# Patient Record
Sex: Male | Born: 1950
Health system: Southern US, Community
[De-identification: ages and names within clinical notes are randomized; demographics above are authoritative.]

## PROBLEM LIST (undated history)

## (undated) DIAGNOSIS — I639 Cerebral infarction, unspecified: Secondary | ICD-10-CM

## (undated) DIAGNOSIS — D125 Benign neoplasm of sigmoid colon: Secondary | ICD-10-CM

## (undated) DIAGNOSIS — E669 Obesity, unspecified: Secondary | ICD-10-CM

## (undated) DIAGNOSIS — E785 Hyperlipidemia, unspecified: Secondary | ICD-10-CM

## (undated) DIAGNOSIS — R600 Localized edema: Secondary | ICD-10-CM

## (undated) DIAGNOSIS — B351 Tinea unguium: Secondary | ICD-10-CM

## (undated) DIAGNOSIS — D124 Benign neoplasm of descending colon: Secondary | ICD-10-CM

## (undated) DIAGNOSIS — N529 Male erectile dysfunction, unspecified: Secondary | ICD-10-CM

## (undated) DIAGNOSIS — F329 Major depressive disorder, single episode, unspecified: Secondary | ICD-10-CM

## (undated) DIAGNOSIS — D123 Benign neoplasm of transverse colon: Secondary | ICD-10-CM

## (undated) DIAGNOSIS — D126 Benign neoplasm of colon, unspecified: Secondary | ICD-10-CM

## (undated) DIAGNOSIS — N183 Chronic kidney disease, stage 3 unspecified: Secondary | ICD-10-CM

## (undated) DIAGNOSIS — G4733 Obstructive sleep apnea (adult) (pediatric): Secondary | ICD-10-CM

## (undated) DIAGNOSIS — I2510 Atherosclerotic heart disease of native coronary artery without angina pectoris: Secondary | ICD-10-CM

## (undated) DIAGNOSIS — G4730 Sleep apnea, unspecified: Secondary | ICD-10-CM

## (undated) DIAGNOSIS — I219 Acute myocardial infarction, unspecified: Secondary | ICD-10-CM

## (undated) DIAGNOSIS — I482 Chronic atrial fibrillation, unspecified: Secondary | ICD-10-CM

## (undated) DIAGNOSIS — I10 Essential (primary) hypertension: Secondary | ICD-10-CM

## (undated) DIAGNOSIS — K219 Gastro-esophageal reflux disease without esophagitis: Secondary | ICD-10-CM

## (undated) DIAGNOSIS — M1990 Unspecified osteoarthritis, unspecified site: Secondary | ICD-10-CM

## (undated) DIAGNOSIS — Z95810 Presence of automatic (implantable) cardiac defibrillator: Secondary | ICD-10-CM

## (undated) DIAGNOSIS — R569 Unspecified convulsions: Secondary | ICD-10-CM

## (undated) DIAGNOSIS — I5042 Chronic combined systolic (congestive) and diastolic (congestive) heart failure: Secondary | ICD-10-CM

## (undated) DIAGNOSIS — F32A Depression, unspecified: Secondary | ICD-10-CM

## (undated) HISTORY — DX: Benign neoplasm of sigmoid colon: D12.5

## (undated) HISTORY — DX: Gastro-esophageal reflux disease without esophagitis: K21.9

## (undated) HISTORY — PX: FOOT ARTHROTOMY: SUR104

## (undated) HISTORY — DX: Sleep apnea, unspecified: G47.30

## (undated) HISTORY — DX: Male erectile dysfunction, unspecified: N52.9

## (undated) HISTORY — DX: Chronic atrial fibrillation, unspecified: I48.20

## (undated) HISTORY — DX: Benign neoplasm of transverse colon: D12.3

## (undated) HISTORY — DX: Tinea unguium: B35.1

## (undated) HISTORY — DX: Benign neoplasm of colon, unspecified: D12.6

## (undated) HISTORY — DX: Localized edema: R60.0

## (undated) HISTORY — DX: Chronic kidney disease, stage 3 unspecified: N18.30

## (undated) HISTORY — DX: Depression, unspecified: F32.A

## (undated) HISTORY — DX: Acute myocardial infarction, unspecified: I21.9

## (undated) HISTORY — DX: Cerebral infarction, unspecified: I63.9

## (undated) HISTORY — DX: Hyperlipidemia, unspecified: E78.5

## (undated) HISTORY — DX: Essential (primary) hypertension: I10

## (undated) HISTORY — DX: Atherosclerotic heart disease of native coronary artery without angina pectoris: I25.10

## (undated) HISTORY — PX: COLONOSCOPY: SHX174

## (undated) HISTORY — DX: Obesity, unspecified: E66.9

## (undated) HISTORY — DX: Benign neoplasm of descending colon: D12.4

## (undated) HISTORY — DX: Obstructive sleep apnea (adult) (pediatric): G47.33

## (undated) HISTORY — DX: Chronic kidney disease, stage 3 (moderate): N18.3

## (undated) HISTORY — DX: Unspecified osteoarthritis, unspecified site: M19.90

## (undated) HISTORY — DX: Chronic combined systolic (congestive) and diastolic (congestive) heart failure: I50.42

## (undated) HISTORY — DX: Major depressive disorder, single episode, unspecified: F32.9

---

## 1997-09-15 ENCOUNTER — Encounter: Admission: RE | Admit: 1997-09-15 | Discharge: 1997-09-15 | Payer: Self-pay | Admitting: Family Medicine

## 1997-09-29 ENCOUNTER — Encounter: Admission: RE | Admit: 1997-09-29 | Discharge: 1997-09-29 | Payer: Self-pay | Admitting: Family Medicine

## 1997-10-25 ENCOUNTER — Encounter: Admission: RE | Admit: 1997-10-25 | Discharge: 1997-10-25 | Payer: Self-pay | Admitting: Family Medicine

## 1997-11-08 ENCOUNTER — Encounter: Admission: RE | Admit: 1997-11-08 | Discharge: 1997-11-08 | Payer: Self-pay | Admitting: Sports Medicine

## 1997-12-07 ENCOUNTER — Encounter: Admission: RE | Admit: 1997-12-07 | Discharge: 1997-12-07 | Payer: Self-pay | Admitting: Family Medicine

## 1997-12-19 ENCOUNTER — Encounter: Admission: RE | Admit: 1997-12-19 | Discharge: 1997-12-19 | Payer: Self-pay | Admitting: Family Medicine

## 1998-02-14 ENCOUNTER — Encounter: Admission: RE | Admit: 1998-02-14 | Discharge: 1998-02-14 | Payer: Self-pay | Admitting: Family Medicine

## 1998-03-14 ENCOUNTER — Encounter: Admission: RE | Admit: 1998-03-14 | Discharge: 1998-03-14 | Payer: Self-pay | Admitting: Sports Medicine

## 1998-03-21 ENCOUNTER — Encounter: Admission: RE | Admit: 1998-03-21 | Discharge: 1998-03-21 | Payer: Self-pay | Admitting: Sports Medicine

## 1998-06-28 ENCOUNTER — Encounter: Payer: Self-pay | Admitting: Family Medicine

## 1998-06-28 ENCOUNTER — Inpatient Hospital Stay (HOSPITAL_COMMUNITY): Admission: EM | Admit: 1998-06-28 | Discharge: 1998-06-30 | Payer: Self-pay | Admitting: Emergency Medicine

## 1998-06-30 ENCOUNTER — Encounter: Payer: Self-pay | Admitting: Family Medicine

## 1998-07-06 ENCOUNTER — Encounter: Admission: RE | Admit: 1998-07-06 | Discharge: 1998-10-04 | Payer: Self-pay | Admitting: *Deleted

## 1998-07-07 ENCOUNTER — Ambulatory Visit (HOSPITAL_COMMUNITY): Admission: RE | Admit: 1998-07-07 | Discharge: 1998-07-07 | Payer: Self-pay | Admitting: Family Medicine

## 1998-07-07 ENCOUNTER — Encounter: Admission: RE | Admit: 1998-07-07 | Discharge: 1998-07-07 | Payer: Self-pay | Admitting: Family Medicine

## 1998-07-13 ENCOUNTER — Encounter: Admission: RE | Admit: 1998-07-13 | Discharge: 1998-07-13 | Payer: Self-pay | Admitting: Family Medicine

## 1998-07-21 ENCOUNTER — Encounter: Admission: RE | Admit: 1998-07-21 | Discharge: 1998-07-21 | Payer: Self-pay | Admitting: Family Medicine

## 1998-08-08 ENCOUNTER — Encounter: Admission: RE | Admit: 1998-08-08 | Discharge: 1998-08-08 | Payer: Self-pay | Admitting: Sports Medicine

## 1998-08-24 ENCOUNTER — Encounter: Admission: RE | Admit: 1998-08-24 | Discharge: 1998-08-24 | Payer: Self-pay | Admitting: Family Medicine

## 1998-09-13 ENCOUNTER — Encounter: Admission: RE | Admit: 1998-09-13 | Discharge: 1998-09-13 | Payer: Self-pay | Admitting: Family Medicine

## 1998-10-13 ENCOUNTER — Encounter: Admission: RE | Admit: 1998-10-13 | Discharge: 1998-10-13 | Payer: Self-pay | Admitting: Family Medicine

## 1998-10-25 ENCOUNTER — Other Ambulatory Visit: Admission: RE | Admit: 1998-10-25 | Discharge: 1998-10-25 | Payer: Self-pay | Admitting: Gastroenterology

## 1998-12-07 ENCOUNTER — Encounter: Admission: RE | Admit: 1998-12-07 | Discharge: 1998-12-07 | Payer: Self-pay | Admitting: Family Medicine

## 1999-01-04 ENCOUNTER — Encounter: Admission: RE | Admit: 1999-01-04 | Discharge: 1999-01-04 | Payer: Self-pay | Admitting: Family Medicine

## 1999-02-21 ENCOUNTER — Encounter: Admission: RE | Admit: 1999-02-21 | Discharge: 1999-02-21 | Payer: Self-pay | Admitting: Family Medicine

## 1999-03-09 ENCOUNTER — Encounter: Admission: RE | Admit: 1999-03-09 | Discharge: 1999-03-09 | Payer: Self-pay | Admitting: Family Medicine

## 1999-04-05 ENCOUNTER — Encounter: Admission: RE | Admit: 1999-04-05 | Discharge: 1999-04-05 | Payer: Self-pay | Admitting: Family Medicine

## 1999-05-28 HISTORY — PX: ANGIOPLASTY: SHX39

## 1999-06-03 ENCOUNTER — Encounter: Payer: Self-pay | Admitting: Emergency Medicine

## 1999-06-03 ENCOUNTER — Inpatient Hospital Stay (HOSPITAL_COMMUNITY): Admission: EM | Admit: 1999-06-03 | Discharge: 1999-06-05 | Payer: Self-pay | Admitting: Emergency Medicine

## 1999-06-08 ENCOUNTER — Encounter: Admission: RE | Admit: 1999-06-08 | Discharge: 1999-06-08 | Payer: Self-pay | Admitting: Family Medicine

## 1999-07-09 ENCOUNTER — Encounter: Payer: Self-pay | Admitting: Family Medicine

## 1999-07-09 ENCOUNTER — Encounter: Admission: RE | Admit: 1999-07-09 | Discharge: 1999-07-09 | Payer: Self-pay | Admitting: Family Medicine

## 1999-07-09 ENCOUNTER — Ambulatory Visit (HOSPITAL_COMMUNITY): Admission: RE | Admit: 1999-07-09 | Discharge: 1999-07-09 | Payer: Self-pay | Admitting: Family Medicine

## 1999-07-09 ENCOUNTER — Inpatient Hospital Stay (HOSPITAL_COMMUNITY): Admission: EM | Admit: 1999-07-09 | Discharge: 1999-07-10 | Payer: Self-pay | Admitting: *Deleted

## 1999-07-10 ENCOUNTER — Encounter: Payer: Self-pay | Admitting: Family Medicine

## 1999-07-23 ENCOUNTER — Encounter: Admission: RE | Admit: 1999-07-23 | Discharge: 1999-07-23 | Payer: Self-pay | Admitting: Family Medicine

## 1999-08-10 ENCOUNTER — Encounter: Admission: RE | Admit: 1999-08-10 | Discharge: 1999-08-10 | Payer: Self-pay | Admitting: Family Medicine

## 1999-08-30 ENCOUNTER — Encounter: Admission: RE | Admit: 1999-08-30 | Discharge: 1999-08-30 | Payer: Self-pay | Admitting: Family Medicine

## 2000-01-17 ENCOUNTER — Encounter: Admission: RE | Admit: 2000-01-17 | Discharge: 2000-01-17 | Payer: Self-pay | Admitting: Family Medicine

## 2000-05-06 ENCOUNTER — Encounter: Admission: RE | Admit: 2000-05-06 | Discharge: 2000-05-06 | Payer: Self-pay | Admitting: Family Medicine

## 2000-06-13 ENCOUNTER — Encounter: Admission: RE | Admit: 2000-06-13 | Discharge: 2000-06-13 | Payer: Self-pay | Admitting: Family Medicine

## 2000-06-17 ENCOUNTER — Encounter: Admission: RE | Admit: 2000-06-17 | Discharge: 2000-06-17 | Payer: Self-pay | Admitting: Family Medicine

## 2000-07-08 ENCOUNTER — Encounter: Admission: RE | Admit: 2000-07-08 | Discharge: 2000-07-08 | Payer: Self-pay | Admitting: Family Medicine

## 2000-08-05 ENCOUNTER — Encounter: Admission: RE | Admit: 2000-08-05 | Discharge: 2000-08-05 | Payer: Self-pay | Admitting: Family Medicine

## 2000-10-15 ENCOUNTER — Encounter: Admission: RE | Admit: 2000-10-15 | Discharge: 2000-10-15 | Payer: Self-pay | Admitting: Family Medicine

## 2000-11-20 ENCOUNTER — Encounter: Admission: RE | Admit: 2000-11-20 | Discharge: 2000-11-20 | Payer: Self-pay | Admitting: Family Medicine

## 2001-02-02 ENCOUNTER — Encounter: Admission: RE | Admit: 2001-02-02 | Discharge: 2001-02-02 | Payer: Self-pay | Admitting: Family Medicine

## 2001-02-06 ENCOUNTER — Encounter: Admission: RE | Admit: 2001-02-06 | Discharge: 2001-02-06 | Payer: Self-pay | Admitting: Family Medicine

## 2001-02-23 ENCOUNTER — Encounter: Admission: RE | Admit: 2001-02-23 | Discharge: 2001-02-23 | Payer: Self-pay | Admitting: Family Medicine

## 2001-07-09 ENCOUNTER — Encounter: Admission: RE | Admit: 2001-07-09 | Discharge: 2001-07-09 | Payer: Self-pay | Admitting: Family Medicine

## 2001-07-30 ENCOUNTER — Encounter: Admission: RE | Admit: 2001-07-30 | Discharge: 2001-07-30 | Payer: Self-pay | Admitting: Family Medicine

## 2001-12-23 ENCOUNTER — Encounter: Admission: RE | Admit: 2001-12-23 | Discharge: 2001-12-23 | Payer: Self-pay | Admitting: Family Medicine

## 2002-03-17 ENCOUNTER — Inpatient Hospital Stay (HOSPITAL_COMMUNITY): Admission: EM | Admit: 2002-03-17 | Discharge: 2002-03-20 | Payer: Self-pay | Admitting: Emergency Medicine

## 2002-03-17 ENCOUNTER — Encounter: Payer: Self-pay | Admitting: Emergency Medicine

## 2002-03-17 ENCOUNTER — Encounter: Admission: RE | Admit: 2002-03-17 | Discharge: 2002-03-17 | Payer: Self-pay | Admitting: Sports Medicine

## 2002-03-25 ENCOUNTER — Encounter: Admission: RE | Admit: 2002-03-25 | Discharge: 2002-03-25 | Payer: Self-pay | Admitting: Family Medicine

## 2002-04-06 ENCOUNTER — Encounter: Admission: RE | Admit: 2002-04-06 | Discharge: 2002-04-06 | Payer: Self-pay | Admitting: Family Medicine

## 2002-04-29 ENCOUNTER — Encounter: Admission: RE | Admit: 2002-04-29 | Discharge: 2002-04-29 | Payer: Self-pay | Admitting: Family Medicine

## 2002-06-22 ENCOUNTER — Encounter: Admission: RE | Admit: 2002-06-22 | Discharge: 2002-06-22 | Payer: Self-pay | Admitting: Family Medicine

## 2002-07-26 ENCOUNTER — Encounter: Admission: RE | Admit: 2002-07-26 | Discharge: 2002-07-26 | Payer: Self-pay | Admitting: Family Medicine

## 2002-08-31 ENCOUNTER — Encounter: Admission: RE | Admit: 2002-08-31 | Discharge: 2002-08-31 | Payer: Self-pay | Admitting: Sports Medicine

## 2002-09-28 ENCOUNTER — Encounter: Admission: RE | Admit: 2002-09-28 | Discharge: 2002-09-28 | Payer: Self-pay | Admitting: Family Medicine

## 2002-10-26 DIAGNOSIS — D126 Benign neoplasm of colon, unspecified: Secondary | ICD-10-CM

## 2002-10-26 HISTORY — DX: Benign neoplasm of colon, unspecified: D12.6

## 2002-10-27 ENCOUNTER — Encounter (INDEPENDENT_AMBULATORY_CARE_PROVIDER_SITE_OTHER): Payer: Self-pay | Admitting: Specialist

## 2002-10-27 ENCOUNTER — Ambulatory Visit (HOSPITAL_COMMUNITY): Admission: RE | Admit: 2002-10-27 | Discharge: 2002-10-27 | Payer: Self-pay | Admitting: Gastroenterology

## 2002-10-28 ENCOUNTER — Encounter: Admission: RE | Admit: 2002-10-28 | Discharge: 2002-10-28 | Payer: Self-pay | Admitting: Sports Medicine

## 2003-02-17 ENCOUNTER — Encounter: Admission: RE | Admit: 2003-02-17 | Discharge: 2003-02-17 | Payer: Self-pay | Admitting: Family Medicine

## 2003-03-23 ENCOUNTER — Encounter: Admission: RE | Admit: 2003-03-23 | Discharge: 2003-03-23 | Payer: Self-pay | Admitting: Family Medicine

## 2003-05-04 ENCOUNTER — Encounter: Admission: RE | Admit: 2003-05-04 | Discharge: 2003-05-04 | Payer: Self-pay | Admitting: Family Medicine

## 2003-06-07 ENCOUNTER — Encounter: Admission: RE | Admit: 2003-06-07 | Discharge: 2003-06-07 | Payer: Self-pay | Admitting: Sports Medicine

## 2003-06-20 ENCOUNTER — Encounter: Admission: RE | Admit: 2003-06-20 | Discharge: 2003-06-20 | Payer: Self-pay | Admitting: Family Medicine

## 2003-06-24 ENCOUNTER — Encounter: Admission: RE | Admit: 2003-06-24 | Discharge: 2003-06-24 | Payer: Self-pay | Admitting: Sports Medicine

## 2003-06-29 ENCOUNTER — Encounter: Admission: RE | Admit: 2003-06-29 | Discharge: 2003-06-29 | Payer: Self-pay | Admitting: Family Medicine

## 2003-08-03 ENCOUNTER — Encounter: Admission: RE | Admit: 2003-08-03 | Discharge: 2003-08-03 | Payer: Self-pay | Admitting: Family Medicine

## 2003-08-17 ENCOUNTER — Encounter: Admission: RE | Admit: 2003-08-17 | Discharge: 2003-08-17 | Payer: Self-pay | Admitting: Family Medicine

## 2003-12-01 ENCOUNTER — Encounter: Admission: RE | Admit: 2003-12-01 | Discharge: 2003-12-01 | Payer: Self-pay | Admitting: Family Medicine

## 2004-03-09 ENCOUNTER — Ambulatory Visit: Payer: Self-pay | Admitting: Family Medicine

## 2004-04-02 ENCOUNTER — Ambulatory Visit: Payer: Self-pay | Admitting: Cardiology

## 2004-04-06 ENCOUNTER — Ambulatory Visit: Payer: Self-pay

## 2004-04-16 ENCOUNTER — Ambulatory Visit: Payer: Self-pay | Admitting: Sports Medicine

## 2004-04-24 ENCOUNTER — Encounter (HOSPITAL_BASED_OUTPATIENT_CLINIC_OR_DEPARTMENT_OTHER): Admission: RE | Admit: 2004-04-24 | Discharge: 2004-04-27 | Payer: Self-pay | Admitting: Internal Medicine

## 2004-06-01 ENCOUNTER — Ambulatory Visit: Payer: Self-pay | Admitting: Family Medicine

## 2004-07-06 ENCOUNTER — Ambulatory Visit: Payer: Self-pay | Admitting: Sports Medicine

## 2004-07-31 ENCOUNTER — Encounter (HOSPITAL_BASED_OUTPATIENT_CLINIC_OR_DEPARTMENT_OTHER): Admission: RE | Admit: 2004-07-31 | Discharge: 2004-08-24 | Payer: Self-pay | Admitting: Internal Medicine

## 2004-09-21 ENCOUNTER — Ambulatory Visit: Payer: Self-pay | Admitting: Family Medicine

## 2004-11-14 ENCOUNTER — Ambulatory Visit: Payer: Self-pay | Admitting: Family Medicine

## 2004-12-17 ENCOUNTER — Ambulatory Visit: Payer: Self-pay | Admitting: Sports Medicine

## 2005-03-01 ENCOUNTER — Ambulatory Visit: Payer: Self-pay | Admitting: Family Medicine

## 2005-05-01 ENCOUNTER — Ambulatory Visit: Payer: Self-pay

## 2005-06-10 ENCOUNTER — Ambulatory Visit: Payer: Self-pay | Admitting: Sports Medicine

## 2005-08-27 ENCOUNTER — Ambulatory Visit: Payer: Self-pay | Admitting: Family Medicine

## 2005-08-28 ENCOUNTER — Encounter: Admission: RE | Admit: 2005-08-28 | Discharge: 2005-08-28 | Payer: Self-pay | Admitting: Sports Medicine

## 2005-09-09 ENCOUNTER — Ambulatory Visit: Payer: Self-pay | Admitting: Cardiology

## 2005-09-19 ENCOUNTER — Ambulatory Visit: Payer: Self-pay

## 2005-09-24 ENCOUNTER — Ambulatory Visit: Payer: Self-pay | Admitting: Family Medicine

## 2005-12-04 ENCOUNTER — Ambulatory Visit (HOSPITAL_COMMUNITY): Admission: RE | Admit: 2005-12-04 | Discharge: 2005-12-04 | Payer: Self-pay | Admitting: Family Medicine

## 2005-12-04 ENCOUNTER — Ambulatory Visit: Payer: Self-pay | Admitting: Sports Medicine

## 2005-12-05 ENCOUNTER — Ambulatory Visit: Payer: Self-pay | Admitting: Family Medicine

## 2005-12-26 ENCOUNTER — Ambulatory Visit: Payer: Self-pay | Admitting: Cardiology

## 2006-01-10 ENCOUNTER — Ambulatory Visit: Payer: Self-pay | Admitting: Family Medicine

## 2006-02-13 ENCOUNTER — Ambulatory Visit: Payer: Self-pay | Admitting: Sports Medicine

## 2006-02-20 ENCOUNTER — Ambulatory Visit: Payer: Self-pay | Admitting: Sports Medicine

## 2006-04-07 ENCOUNTER — Ambulatory Visit: Payer: Self-pay | Admitting: Sports Medicine

## 2006-04-09 ENCOUNTER — Ambulatory Visit (HOSPITAL_COMMUNITY): Admission: RE | Admit: 2006-04-09 | Discharge: 2006-04-09 | Payer: Self-pay | Admitting: Family Medicine

## 2006-05-30 ENCOUNTER — Ambulatory Visit: Payer: Self-pay | Admitting: Family Medicine

## 2006-07-04 ENCOUNTER — Ambulatory Visit: Payer: Self-pay | Admitting: Family Medicine

## 2006-07-24 DIAGNOSIS — I2510 Atherosclerotic heart disease of native coronary artery without angina pectoris: Secondary | ICD-10-CM | POA: Insufficient documentation

## 2006-07-24 DIAGNOSIS — I10 Essential (primary) hypertension: Secondary | ICD-10-CM | POA: Insufficient documentation

## 2006-07-24 DIAGNOSIS — F32A Depression, unspecified: Secondary | ICD-10-CM | POA: Insufficient documentation

## 2006-07-24 DIAGNOSIS — F5232 Male orgasmic disorder: Secondary | ICD-10-CM | POA: Insufficient documentation

## 2006-07-24 DIAGNOSIS — F329 Major depressive disorder, single episode, unspecified: Secondary | ICD-10-CM | POA: Insufficient documentation

## 2006-07-24 DIAGNOSIS — Z794 Long term (current) use of insulin: Secondary | ICD-10-CM

## 2006-07-24 DIAGNOSIS — E1149 Type 2 diabetes mellitus with other diabetic neurological complication: Secondary | ICD-10-CM | POA: Insufficient documentation

## 2006-07-24 DIAGNOSIS — K219 Gastro-esophageal reflux disease without esophagitis: Secondary | ICD-10-CM | POA: Insufficient documentation

## 2006-07-24 DIAGNOSIS — E669 Obesity, unspecified: Secondary | ICD-10-CM | POA: Insufficient documentation

## 2006-07-24 DIAGNOSIS — E7800 Pure hypercholesterolemia, unspecified: Secondary | ICD-10-CM | POA: Insufficient documentation

## 2006-07-24 DIAGNOSIS — E119 Type 2 diabetes mellitus without complications: Secondary | ICD-10-CM | POA: Insufficient documentation

## 2006-08-20 ENCOUNTER — Telehealth: Payer: Self-pay | Admitting: *Deleted

## 2006-09-23 ENCOUNTER — Ambulatory Visit: Payer: Self-pay | Admitting: Family Medicine

## 2006-09-23 LAB — CONVERTED CEMR LAB: Hgb A1c MFr Bld: 8 %

## 2006-09-24 ENCOUNTER — Telehealth: Payer: Self-pay | Admitting: *Deleted

## 2006-10-02 ENCOUNTER — Ambulatory Visit: Payer: Self-pay | Admitting: Cardiology

## 2006-10-02 LAB — CONVERTED CEMR LAB
BUN: 12 mg/dL (ref 6–23)
CO2: 31 meq/L (ref 19–32)
Calcium: 8.8 mg/dL (ref 8.4–10.5)
Chloride: 106 meq/L (ref 96–112)
Creatinine, Ser: 1.1 mg/dL (ref 0.4–1.5)
GFR calc Af Amer: 89 mL/min
GFR calc non Af Amer: 74 mL/min
Glucose, Bld: 276 mg/dL — ABNORMAL HIGH (ref 70–99)
Potassium: 3.5 meq/L (ref 3.5–5.1)
Sodium: 142 meq/L (ref 135–145)

## 2006-10-07 ENCOUNTER — Telehealth: Payer: Self-pay | Admitting: *Deleted

## 2006-11-07 ENCOUNTER — Encounter (INDEPENDENT_AMBULATORY_CARE_PROVIDER_SITE_OTHER): Payer: Self-pay | Admitting: Family Medicine

## 2007-01-07 ENCOUNTER — Ambulatory Visit: Payer: Self-pay | Admitting: Family Medicine

## 2007-01-13 ENCOUNTER — Ambulatory Visit: Payer: Self-pay | Admitting: Cardiology

## 2007-01-21 ENCOUNTER — Encounter (INDEPENDENT_AMBULATORY_CARE_PROVIDER_SITE_OTHER): Payer: Self-pay | Admitting: Family Medicine

## 2007-01-21 ENCOUNTER — Ambulatory Visit: Payer: Self-pay | Admitting: Family Medicine

## 2007-01-23 LAB — CONVERTED CEMR LAB
BUN: 11 mg/dL (ref 6–23)
CO2: 27 meq/L (ref 19–32)
Calcium: 9.3 mg/dL (ref 8.4–10.5)
Chloride: 105 meq/L (ref 96–112)
Creatinine, Ser: 0.97 mg/dL (ref 0.40–1.50)
Glucose, Bld: 111 mg/dL — ABNORMAL HIGH (ref 70–99)
Potassium: 3.8 meq/L (ref 3.5–5.3)
Sodium: 143 meq/L (ref 135–145)

## 2007-03-09 ENCOUNTER — Encounter (INDEPENDENT_AMBULATORY_CARE_PROVIDER_SITE_OTHER): Payer: Self-pay | Admitting: Family Medicine

## 2007-03-09 ENCOUNTER — Ambulatory Visit: Payer: Self-pay | Admitting: Family Medicine

## 2007-03-09 LAB — CONVERTED CEMR LAB
ALT: 17 units/L (ref 0–53)
AST: 17 units/L (ref 0–37)
Albumin: 4.3 g/dL (ref 3.5–5.2)
Alkaline Phosphatase: 102 units/L (ref 39–117)
BUN: 14 mg/dL (ref 6–23)
Bilirubin Urine: NEGATIVE
Blood in Urine, dipstick: NEGATIVE
CO2: 28 meq/L (ref 19–32)
Calcium: 9.5 mg/dL (ref 8.4–10.5)
Chloride: 106 meq/L (ref 96–112)
Creatinine, Ser: 0.95 mg/dL (ref 0.40–1.50)
Creatinine, Urine: 94.8 mg/dL
Glucose, Bld: 70 mg/dL (ref 70–99)
Glucose, Urine, Semiquant: NEGATIVE
Hgb A1c MFr Bld: 7.5 %
Ketones, urine, test strip: NEGATIVE
Nitrite: NEGATIVE
PSA: 0.52 ng/mL (ref 0.10–4.00)
Potassium: 4.4 meq/L (ref 3.5–5.3)
Protein, U semiquant: 100
Sodium: 144 meq/L (ref 135–145)
Specific Gravity, Urine: 1.02
Total Bilirubin: 0.9 mg/dL (ref 0.3–1.2)
Total Protein, Urine: 55
Total Protein: 6.9 g/dL (ref 6.0–8.3)
Urobilinogen, UA: 1
WBC Urine, dipstick: NEGATIVE
pH: 7

## 2007-03-23 ENCOUNTER — Ambulatory Visit: Payer: Self-pay | Admitting: Family Medicine

## 2007-03-23 ENCOUNTER — Encounter (INDEPENDENT_AMBULATORY_CARE_PROVIDER_SITE_OTHER): Payer: Self-pay | Admitting: Family Medicine

## 2007-03-23 LAB — CONVERTED CEMR LAB
BUN: 17 mg/dL (ref 6–23)
CO2: 26 meq/L (ref 19–32)
Calcium: 9.5 mg/dL (ref 8.4–10.5)
Chloride: 102 meq/L (ref 96–112)
Cholesterol: 115 mg/dL (ref 0–200)
Creatinine, Ser: 0.98 mg/dL (ref 0.40–1.50)
Glucose, Bld: 268 mg/dL — ABNORMAL HIGH (ref 70–99)
HDL: 37 mg/dL — ABNORMAL LOW (ref >39)
LDL Cholesterol: 49 mg/dL (ref 0–99)
Potassium: 4.5 meq/L (ref 3.5–5.3)
Sodium: 141 meq/L (ref 135–145)
Total CHOL/HDL Ratio: 3.1
Triglycerides: 147 mg/dL (ref <150)
VLDL: 29 mg/dL (ref 0–40)

## 2007-03-27 ENCOUNTER — Telehealth (INDEPENDENT_AMBULATORY_CARE_PROVIDER_SITE_OTHER): Payer: Self-pay | Admitting: Family Medicine

## 2007-04-02 ENCOUNTER — Ambulatory Visit: Payer: Self-pay | Admitting: Cardiology

## 2007-04-02 ENCOUNTER — Telehealth (INDEPENDENT_AMBULATORY_CARE_PROVIDER_SITE_OTHER): Payer: Self-pay | Admitting: Family Medicine

## 2007-06-18 ENCOUNTER — Ambulatory Visit: Payer: Self-pay | Admitting: Family Medicine

## 2007-06-18 LAB — CONVERTED CEMR LAB: Hgb A1c MFr Bld: 7.4 %

## 2007-06-26 ENCOUNTER — Emergency Department (HOSPITAL_COMMUNITY): Admission: EM | Admit: 2007-06-26 | Discharge: 2007-06-26 | Payer: Self-pay | Admitting: Emergency Medicine

## 2007-06-26 ENCOUNTER — Telehealth: Payer: Self-pay | Admitting: *Deleted

## 2007-07-21 ENCOUNTER — Ambulatory Visit: Payer: Self-pay | Admitting: Family Medicine

## 2007-07-21 DIAGNOSIS — F17200 Nicotine dependence, unspecified, uncomplicated: Secondary | ICD-10-CM | POA: Insufficient documentation

## 2007-08-19 ENCOUNTER — Encounter
Admission: RE | Admit: 2007-08-19 | Discharge: 2007-08-19 | Payer: Self-pay | Admitting: Physical Medicine and Rehabilitation

## 2007-09-15 ENCOUNTER — Telehealth (INDEPENDENT_AMBULATORY_CARE_PROVIDER_SITE_OTHER): Payer: Self-pay | Admitting: *Deleted

## 2007-09-23 ENCOUNTER — Ambulatory Visit: Payer: Self-pay | Admitting: Family Medicine

## 2007-09-23 ENCOUNTER — Encounter (INDEPENDENT_AMBULATORY_CARE_PROVIDER_SITE_OTHER): Payer: Self-pay | Admitting: Family Medicine

## 2007-09-25 LAB — CONVERTED CEMR LAB
ALT: 17 units/L (ref 0–53)
AST: 18 units/L (ref 0–37)
Albumin: 4 g/dL (ref 3.5–5.2)
Alkaline Phosphatase: 97 units/L (ref 39–117)
BUN: 14 mg/dL (ref 6–23)
CO2: 23 meq/L (ref 19–32)
Calcium: 9.3 mg/dL (ref 8.4–10.5)
Chloride: 105 meq/L (ref 96–112)
Creatinine, Ser: 0.95 mg/dL (ref 0.40–1.50)
Glucose, Bld: 213 mg/dL — ABNORMAL HIGH (ref 70–99)
HCT: 39.7 % (ref 39.0–52.0)
Hemoglobin: 12.9 g/dL — ABNORMAL LOW (ref 13.0–17.0)
MCHC: 32.5 g/dL (ref 30.0–36.0)
MCV: 88.4 fL (ref 78.0–100.0)
Platelets: 145 10*3/uL — ABNORMAL LOW (ref 150–400)
Potassium: 4.2 meq/L (ref 3.5–5.3)
RBC: 4.49 M/uL (ref 4.22–5.81)
RDW: 13.8 % (ref 11.5–15.5)
Sodium: 139 meq/L (ref 135–145)
TSH: 0.754 microintl units/mL (ref 0.350–5.50)
Total Bilirubin: 0.8 mg/dL (ref 0.3–1.2)
Total Protein: 6.8 g/dL (ref 6.0–8.3)
WBC: 6.3 10*3/uL (ref 4.0–10.5)

## 2007-10-09 ENCOUNTER — Telehealth (INDEPENDENT_AMBULATORY_CARE_PROVIDER_SITE_OTHER): Payer: Self-pay | Admitting: Family Medicine

## 2007-11-18 ENCOUNTER — Ambulatory Visit: Payer: Self-pay | Admitting: Family Medicine

## 2007-12-18 ENCOUNTER — Ambulatory Visit: Payer: Self-pay | Admitting: Family Medicine

## 2007-12-18 DIAGNOSIS — I872 Venous insufficiency (chronic) (peripheral): Secondary | ICD-10-CM | POA: Insufficient documentation

## 2007-12-18 LAB — CONVERTED CEMR LAB: Hgb A1c MFr Bld: 7.7 %

## 2007-12-21 ENCOUNTER — Telehealth (INDEPENDENT_AMBULATORY_CARE_PROVIDER_SITE_OTHER): Payer: Self-pay | Admitting: Family Medicine

## 2007-12-22 ENCOUNTER — Telehealth (INDEPENDENT_AMBULATORY_CARE_PROVIDER_SITE_OTHER): Payer: Self-pay | Admitting: Family Medicine

## 2007-12-24 ENCOUNTER — Telehealth (INDEPENDENT_AMBULATORY_CARE_PROVIDER_SITE_OTHER): Payer: Self-pay | Admitting: Family Medicine

## 2008-01-01 ENCOUNTER — Ambulatory Visit: Payer: Self-pay | Admitting: Cardiology

## 2008-01-11 ENCOUNTER — Ambulatory Visit: Payer: Self-pay

## 2008-01-11 ENCOUNTER — Encounter (INDEPENDENT_AMBULATORY_CARE_PROVIDER_SITE_OTHER): Payer: Self-pay | Admitting: Family Medicine

## 2008-02-24 ENCOUNTER — Ambulatory Visit: Payer: Self-pay | Admitting: Family Medicine

## 2008-02-24 DIAGNOSIS — E1139 Type 2 diabetes mellitus with other diabetic ophthalmic complication: Secondary | ICD-10-CM | POA: Insufficient documentation

## 2008-03-10 ENCOUNTER — Telehealth (INDEPENDENT_AMBULATORY_CARE_PROVIDER_SITE_OTHER): Payer: Self-pay | Admitting: *Deleted

## 2008-04-15 ENCOUNTER — Ambulatory Visit: Payer: Self-pay | Admitting: Family Medicine

## 2008-04-15 LAB — CONVERTED CEMR LAB: Hgb A1c MFr Bld: 7.4 %

## 2008-06-10 ENCOUNTER — Ambulatory Visit: Payer: Self-pay | Admitting: Family Medicine

## 2008-08-04 ENCOUNTER — Ambulatory Visit (HOSPITAL_COMMUNITY): Admission: RE | Admit: 2008-08-04 | Discharge: 2008-08-04 | Payer: Self-pay | Admitting: Family Medicine

## 2008-08-04 ENCOUNTER — Telehealth (INDEPENDENT_AMBULATORY_CARE_PROVIDER_SITE_OTHER): Payer: Self-pay | Admitting: Family Medicine

## 2008-08-04 ENCOUNTER — Ambulatory Visit: Payer: Self-pay

## 2008-08-04 LAB — CONVERTED CEMR LAB: Hgb A1c MFr Bld: 7.7 %

## 2008-08-05 ENCOUNTER — Ambulatory Visit: Payer: Self-pay | Admitting: Cardiology

## 2008-08-05 ENCOUNTER — Encounter: Payer: Self-pay | Admitting: Cardiovascular Disease

## 2008-08-08 ENCOUNTER — Encounter (INDEPENDENT_AMBULATORY_CARE_PROVIDER_SITE_OTHER): Payer: Self-pay | Admitting: Family Medicine

## 2008-08-30 ENCOUNTER — Encounter: Payer: Self-pay | Admitting: Cardiology

## 2008-08-30 ENCOUNTER — Ambulatory Visit: Payer: Self-pay | Admitting: Cardiology

## 2008-09-13 ENCOUNTER — Telehealth: Payer: Self-pay | Admitting: Cardiology

## 2008-09-16 ENCOUNTER — Ambulatory Visit: Payer: Self-pay | Admitting: Family Medicine

## 2008-10-14 ENCOUNTER — Telehealth: Payer: Self-pay | Admitting: *Deleted

## 2008-10-20 ENCOUNTER — Encounter (INDEPENDENT_AMBULATORY_CARE_PROVIDER_SITE_OTHER): Payer: Self-pay | Admitting: Family Medicine

## 2008-10-20 ENCOUNTER — Ambulatory Visit: Payer: Self-pay | Admitting: Family Medicine

## 2008-10-20 LAB — CONVERTED CEMR LAB
ALT: 20 units/L (ref 0–53)
AST: 18 units/L (ref 0–37)
Albumin: 3.9 g/dL (ref 3.5–5.2)
Alkaline Phosphatase: 103 units/L (ref 39–117)
BUN: 9 mg/dL (ref 6–23)
CO2: 24 meq/L (ref 19–32)
Calcium: 9.2 mg/dL (ref 8.4–10.5)
Chloride: 104 meq/L (ref 96–112)
Cholesterol: 110 mg/dL (ref 0–200)
Creatinine, Ser: 0.95 mg/dL (ref 0.40–1.50)
Glucose, Bld: 228 mg/dL — ABNORMAL HIGH (ref 70–99)
HDL: 40 mg/dL (ref >39)
LDL Cholesterol: 59 mg/dL (ref 0–99)
Potassium: 3.9 meq/L (ref 3.5–5.3)
Sodium: 140 meq/L (ref 135–145)
Total Bilirubin: 0.8 mg/dL (ref 0.3–1.2)
Total CHOL/HDL Ratio: 2.8
Total Protein: 6.3 g/dL (ref 6.0–8.3)
Triglycerides: 53 mg/dL (ref <150)
VLDL: 11 mg/dL (ref 0–40)

## 2008-10-21 ENCOUNTER — Encounter (INDEPENDENT_AMBULATORY_CARE_PROVIDER_SITE_OTHER): Payer: Self-pay | Admitting: Family Medicine

## 2008-11-07 ENCOUNTER — Ambulatory Visit: Payer: Self-pay | Admitting: Family Medicine

## 2008-11-07 DIAGNOSIS — B351 Tinea unguium: Secondary | ICD-10-CM | POA: Insufficient documentation

## 2008-11-07 LAB — CONVERTED CEMR LAB: Hgb A1c MFr Bld: 8.1 %

## 2008-11-21 ENCOUNTER — Telehealth: Payer: Self-pay | Admitting: Family Medicine

## 2008-11-23 ENCOUNTER — Encounter: Payer: Self-pay | Admitting: *Deleted

## 2008-11-24 ENCOUNTER — Encounter: Payer: Self-pay | Admitting: Family Medicine

## 2008-11-25 ENCOUNTER — Encounter: Payer: Self-pay | Admitting: Family Medicine

## 2008-12-26 ENCOUNTER — Telehealth: Payer: Self-pay | Admitting: Family Medicine

## 2009-01-09 ENCOUNTER — Encounter: Payer: Self-pay | Admitting: Family Medicine

## 2009-01-10 ENCOUNTER — Ambulatory Visit: Payer: Self-pay | Admitting: Family Medicine

## 2009-01-26 ENCOUNTER — Ambulatory Visit: Payer: Self-pay | Admitting: Family Medicine

## 2009-03-30 ENCOUNTER — Ambulatory Visit: Payer: Self-pay | Admitting: Family Medicine

## 2009-03-30 LAB — CONVERTED CEMR LAB: Hgb A1c MFr Bld: 8 %

## 2009-04-04 ENCOUNTER — Telehealth: Payer: Self-pay | Admitting: Family Medicine

## 2009-05-16 ENCOUNTER — Encounter: Payer: Self-pay | Admitting: Family Medicine

## 2009-06-15 ENCOUNTER — Telehealth: Payer: Self-pay | Admitting: Family Medicine

## 2009-07-12 ENCOUNTER — Ambulatory Visit: Payer: Self-pay | Admitting: Family Medicine

## 2009-07-12 ENCOUNTER — Ambulatory Visit (HOSPITAL_COMMUNITY): Admission: RE | Admit: 2009-07-12 | Discharge: 2009-07-12 | Payer: Self-pay | Admitting: Family Medicine

## 2009-07-12 LAB — CONVERTED CEMR LAB: Hgb A1c MFr Bld: 7.6 %

## 2009-07-13 ENCOUNTER — Ambulatory Visit: Payer: Self-pay | Admitting: Family Medicine

## 2009-07-13 ENCOUNTER — Telehealth: Payer: Self-pay | Admitting: Family Medicine

## 2009-07-13 DIAGNOSIS — J029 Acute pharyngitis, unspecified: Secondary | ICD-10-CM | POA: Insufficient documentation

## 2009-07-13 LAB — CONVERTED CEMR LAB: Rapid Strep: POSITIVE

## 2009-07-20 ENCOUNTER — Encounter: Payer: Self-pay | Admitting: Family Medicine

## 2009-08-04 ENCOUNTER — Telehealth: Payer: Self-pay | Admitting: Family Medicine

## 2009-08-10 ENCOUNTER — Ambulatory Visit: Payer: Self-pay | Admitting: Family Medicine

## 2009-08-10 ENCOUNTER — Telehealth: Payer: Self-pay | Admitting: Family Medicine

## 2009-08-10 ENCOUNTER — Encounter: Payer: Self-pay | Admitting: Sports Medicine

## 2009-08-10 ENCOUNTER — Telehealth: Payer: Self-pay | Admitting: Sports Medicine

## 2009-08-15 ENCOUNTER — Encounter (INDEPENDENT_AMBULATORY_CARE_PROVIDER_SITE_OTHER): Payer: Self-pay | Admitting: *Deleted

## 2009-08-15 ENCOUNTER — Encounter: Payer: Self-pay | Admitting: Family Medicine

## 2009-08-15 ENCOUNTER — Ambulatory Visit: Payer: Self-pay | Admitting: Family Medicine

## 2009-08-15 DIAGNOSIS — R609 Edema, unspecified: Secondary | ICD-10-CM | POA: Insufficient documentation

## 2009-08-15 LAB — CONVERTED CEMR LAB
ALT: 15 units/L (ref 0–53)
AST: 20 units/L (ref 0–37)
Albumin: 3.6 g/dL (ref 3.5–5.2)
Alkaline Phosphatase: 85 units/L (ref 39–117)
BUN: 10 mg/dL (ref 6–23)
CO2: 24 meq/L (ref 19–32)
Calcium: 9.1 mg/dL (ref 8.4–10.5)
Chloride: 105 meq/L (ref 96–112)
Creatinine, Ser: 0.98 mg/dL (ref 0.40–1.50)
Glucose, Bld: 74 mg/dL (ref 70–99)
Potassium: 3.7 meq/L (ref 3.5–5.3)
Sodium: 141 meq/L (ref 135–145)
Total Bilirubin: 0.5 mg/dL (ref 0.3–1.2)
Total Protein: 6 g/dL (ref 6.0–8.3)

## 2009-09-04 ENCOUNTER — Emergency Department (HOSPITAL_COMMUNITY): Admission: EM | Admit: 2009-09-04 | Discharge: 2009-09-04 | Payer: Self-pay | Admitting: Emergency Medicine

## 2009-09-04 ENCOUNTER — Telehealth: Payer: Self-pay | Admitting: Family Medicine

## 2009-09-06 ENCOUNTER — Encounter: Payer: Self-pay | Admitting: Family Medicine

## 2009-09-07 ENCOUNTER — Ambulatory Visit: Payer: Self-pay | Admitting: Family Medicine

## 2009-09-07 DIAGNOSIS — N200 Calculus of kidney: Secondary | ICD-10-CM | POA: Insufficient documentation

## 2009-09-07 DIAGNOSIS — N3941 Urge incontinence: Secondary | ICD-10-CM | POA: Insufficient documentation

## 2009-09-07 LAB — CONVERTED CEMR LAB
Bilirubin Urine: NEGATIVE
Glucose, Urine, Semiquant: 100
Ketones, urine, test strip: NEGATIVE
Nitrite: NEGATIVE
Protein, U semiquant: 100
Specific Gravity, Urine: 1.01
Urobilinogen, UA: 0.2
WBC Urine, dipstick: NEGATIVE
pH: 5.5

## 2009-09-11 ENCOUNTER — Ambulatory Visit: Payer: Self-pay | Admitting: Cardiology

## 2009-10-16 ENCOUNTER — Ambulatory Visit: Payer: Self-pay | Admitting: Family Medicine

## 2009-11-29 ENCOUNTER — Encounter: Payer: Self-pay | Admitting: *Deleted

## 2009-12-13 ENCOUNTER — Ambulatory Visit: Payer: Self-pay | Admitting: Family Medicine

## 2009-12-26 ENCOUNTER — Telehealth: Payer: Self-pay | Admitting: Family Medicine

## 2009-12-27 ENCOUNTER — Encounter: Payer: Self-pay | Admitting: Family Medicine

## 2010-02-08 ENCOUNTER — Ambulatory Visit: Payer: Self-pay | Admitting: Family Medicine

## 2010-02-08 LAB — CONVERTED CEMR LAB: Hgb A1c MFr Bld: 11.3 %

## 2010-02-09 ENCOUNTER — Encounter: Payer: Self-pay | Admitting: Family Medicine

## 2010-02-09 LAB — CONVERTED CEMR LAB
BUN: 8 mg/dL (ref 6–23)
Basophils Absolute: 0 10*3/uL (ref 0.0–0.1)
Basophils Relative: 0 % (ref 0–1)
CO2: 26 meq/L (ref 19–32)
Calcium: 8.9 mg/dL (ref 8.4–10.5)
Chloride: 102 meq/L (ref 96–112)
Creatinine, Ser: 0.88 mg/dL (ref 0.40–1.50)
Eosinophils Absolute: 0.1 10*3/uL (ref 0.0–0.7)
Eosinophils Relative: 1 % (ref 0–5)
Glucose, Bld: 347 mg/dL — ABNORMAL HIGH (ref 70–99)
HCT: 40.5 % (ref 39.0–52.0)
Hemoglobin: 13.1 g/dL (ref 13.0–17.0)
Lymphocytes Relative: 15 % (ref 12–46)
Lymphs Abs: 1.5 10*3/uL (ref 0.7–4.0)
MCHC: 32.3 g/dL (ref 30.0–36.0)
MCV: 86 fL (ref 78.0–100.0)
Monocytes Absolute: 0.9 10*3/uL (ref 0.1–1.0)
Monocytes Relative: 9 % (ref 3–12)
Neutro Abs: 7.3 10*3/uL (ref 1.7–7.7)
Neutrophils Relative %: 76 % (ref 43–77)
Platelets: 157 10*3/uL (ref 150–400)
Potassium: 4.4 meq/L (ref 3.5–5.3)
RBC: 4.71 M/uL (ref 4.22–5.81)
RDW: 13.6 % (ref 11.5–15.5)
Sodium: 139 meq/L (ref 135–145)
WBC: 9.7 10*3/uL (ref 4.0–10.5)

## 2010-03-27 ENCOUNTER — Encounter (INDEPENDENT_AMBULATORY_CARE_PROVIDER_SITE_OTHER): Payer: Self-pay | Admitting: Pharmacist

## 2010-04-12 ENCOUNTER — Telehealth: Payer: Self-pay | Admitting: Cardiology

## 2010-05-08 ENCOUNTER — Telehealth: Payer: Self-pay | Admitting: Family Medicine

## 2010-05-27 HISTORY — PX: TOE AMPUTATION: SHX809

## 2010-06-11 ENCOUNTER — Ambulatory Visit: Admission: RE | Admit: 2010-06-11 | Discharge: 2010-06-11 | Payer: Self-pay | Source: Home / Self Care

## 2010-06-26 NOTE — Progress Notes (Signed)
Summary: Medication  Phone Note Call from Patient Call back at Home Phone [phone]   Reason for Call: Talk to Doctor Summary of Call: pt received letter from [facility], where he received his meds & sts they will no longer carry his blood pressure medication after 08/26/06, pt would like to know if there is an alternative for this medication? Initial call taken by: ERIN LEVAN,  August 20, 2006 1:58 PM  Follow-up for Phone Call        Please contact insurance company (or have pt do so) and find out what BP med he was on that they do not cover and which ones they do cover. THanks Follow-up by: Lady Deutscher MD,  August 21, 2006 1:40 PM  Additional Follow-up for Phone Call Additional follow up Details #1::        LMOVM to do the above Additional Follow-up by: Christen Bame CMA,  August 21, 2006 1:54 PM

## 2010-06-26 NOTE — Progress Notes (Signed)
Summary: Refill  Phone Note Call from Patient Call back at Home Phone [phone]   Summary of Call: Pt is needing refills on all meds faxed to [facility] - Fax number is [phone]. Initial call taken by: Drucie Ip,  March 27, 2007 2:38 PM  Follow-up for Phone Call        please have RN staff to fax....meds updated in centricity.   Follow-up by: Marzetta Merino  MD,  March 27, 2007 2:53 PM  Additional Follow-up for Phone Call Additional follow up Details #1::        Rxs faxed to [facility] - pt notified.  Additional Follow-up by: AMY MARTIN RN,  March 27, 2007 3:58 PM      Prescriptions: HYZAAR 100-25 MG TABS (LOSARTAN POTASSIUM-HCTZ) Take 1 tablet by mouth once a day  #90 x 3   Entered by:   AMY MARTIN RN   Authorized by:   Marzetta Merino  MD   Signed by:   Arnette Schaumann RN on 03/27/2007   Method used:   Print then Give to Patient   RxID:   [id number] ISOSORBIDE MONONITRATE CR 60 MG TB24 (ISOSORBIDE MONONITRATE) Take 1 tablet by mouth once a day  #90 x 3   Entered by:   AMY MARTIN RN   Authorized by:   Marzetta Merino  MD   Signed by:   Arnette Schaumann RN on 03/27/2007   Method used:   Print then Give to Patient   [id number] LIPITOR 10 MG TABS (ATORVASTATIN CALCIUM) Take 1 tablet by mouth every night  #90 x 3   Entered by:   AMY MARTIN RN   Authorized by:   Marzetta Merino  MD   Signed by:   Arnette Schaumann RN on 03/27/2007   Method used:   Print then Give to Patient   [id number] NORVASC 10 MG TABS (AMLODIPINE BESYLATE) Take 1/2  tablet by mouth once a day  #90 x 3   Entered by:   AMY MARTIN RN   Authorized by:   Marzetta Merino  MD   Signed by:   Arnette Schaumann RN on 03/27/2007   Method used:   Print then Give to Patient   [id number] METFORMIN HCL 1000 MG TABS (METFORMIN HCL) 1 tablet twice a day for diabetes  #180 x 3   Entered by:   AMY MARTIN RN   Authorized by:   Marzetta Merino  MD   Signed by:   Arnette Schaumann RN on 03/27/2007   Method  used:   Print then Give to Patient   [id number] PAXIL 30 MG TABS (PAROXETINE HCL) Take 1 tablet by mouth once a day  #90 x 3   Entered by:   AMY MARTIN RN   Authorized by:   Marzetta Merino  MD   Signed by:   Arnette Schaumann RN on 03/27/2007   Method used:   Print then Give to Patient   [id number] PREVACID 30 MG CPDR (LANSOPRAZOLE) Take 1 capsule by mouth once a day  #90 x 3   Entered by:   AMY MARTIN RN   Authorized by:   Marzetta Merino  MD   Signed by:   Arnette Schaumann RN on 03/27/2007   Method used:   Print then Give to Patient   RxID:   [id number] RELION 70/30 70-30 % SUSP (INSULIN ISOPHANE & REGULAR) Inject 60 unit subcutaneously  #6 x 5  Entered by:   AMY MARTIN RN   Authorized by:   Marzetta Merino  MD   Signed by:   Arnette Schaumann RN on 03/27/2007   Method used:   Print then Give to Patient   RxID:   [id number] TOPROL XL 200 MG  TB24 (METOPROLOL SUCCINATE) 1 tablet one time a day for blood pressure  #90 x 3   Entered by:   AMY MARTIN RN   Authorized by:   Marzetta Merino  MD   Signed by:   Arnette Schaumann RN on 03/27/2007   Method used:   Print then Give to Patient   RxID:   [id number] SPIRONOLACTONE 25 MG TABS (SPIRONOLACTONE) 1 tablet 1 time a day for blood pressure and to raise potassium  #90 x 3   Entered by:   AMY MARTIN RN   Authorized by:   Marzetta Merino  MD   Signed by:   Arnette Schaumann RN on 03/27/2007   Method used:   Print then Give to Patient   [id number] JANUVIA 100 MG TABS (SITAGLIPTIN PHOSPHATE) 1 tablet a day  #90 x 3   Entered by:   AMY MARTIN RN   Authorized by:   Marzetta Merino  MD   Signed by:   Arnette Schaumann RN on 03/27/2007   Method used:   Print then Give to Patient   [id number]

## 2010-06-26 NOTE — Assessment & Plan Note (Signed)
Summary: Change of insulin Rx Clinic   Vital Signs:  Patient profile:   60 year old male Height:      72 inches Weight:      217 pounds BMI:     29.54 BP sitting:   173 / 84  (left arm)  Primary Care Provider:  [name] MD   History of Present Illness: Patient arrives in order to switch insulin from previously used. 70/30 mixed insulin (relion)  piror to visit with [facility] progran for medication assistance.   He has no health care coverage for medication at this time, so denies checking his BG for the past 2 months.   Patient denies symptoms of hypoglycemia.   Notes he has noticed increased fatigue lately and more difficulty sleeping.   Denies excesssive nocturia.  Notes he has quit smoking in the last month.    Current Medications (verified): 1)  Isosorbide Mononitrate Cr 60 Mg Tb24 (Isosorbide Mononitrate) .... Take 1 Tablet By Mouth Once A Day For Blood Pressure 2)  Metformin Hcl 1000 Mg Tabs (Metformin Hcl) .Marland Kitchen.. 1 Tablet Twice A Day For Diabetes 3)  Paroxetine Hcl 20 Mg  Tabs (Paroxetine Hcl) .... One and One-Half Tabs By Mouth Daily 4)  Prevacid 30 Mg Cpdr (Lansoprazole) .... Take 1 Capsule By Mouth Once A Day For Stomach 5)  Lantus Solostar 100 Unit/ml  Soln (Insulin Glargine) .... 60 Units Each Morning.  Dispense Quantity Sufficient For One Month Supply. 6)  Metoprolol Tartrate 100 Mg Tabs (Metoprolol Tartrate) .Marland Kitchen.. 1 Tablet By Mouth Two Times A Day For Blood Pressure 7)  Fish Oil Concentrate 1000 Mg  Caps (Omega-3 Fatty Acids) .Marland Kitchen.. 1 Tablet Daily 8)  Adult Aspirin Ec Low Strength 81 Mg Tbec (Aspirin) .Marland Kitchen.. 1 Tablet By Mouth Daily 9)  Nitrostat 0.4 Mg Subl (Nitroglycerin) .Marland Kitchen.. 1 Tab By Mouth Sl As Needed Chest Pain 10)  Multivitamins   Tabs (Multiple Vitamin) .... Take 1 Once Daily 11)  Hydrochlorothiazide 25 Mg Tabs (Hydrochlorothiazide) .Marland Kitchen.. 1 Tablet By Mouth Daily For Blood Pressure 12)  Oxycontin 10 Mg Xr12h-Tab (Oxycodone Hcl) .Marland Kitchen.. 1 Tablet By Mouth Three Times A Day-  Prescribed By Pain Doctor 13)  Simvastatin 10 Mg Tabs (Simvastatin) .Marland Kitchen.. 1 At Bedtime  Allergies (verified): 1)  Enalapril Maleate (Enalapril Maleate)   Impression & Recommendations:  Problem # 1:  DIABETES MELLITUS, II, COMPLICATIONS (A999333) Assessment Unchanged  Stopped checking his BG 2 months ago.  Complains of fatigue, and trouble sleeping (2-3x/wk).  Currently on Metformin 1000mg  BID and Novolin (Relion) 70/30 (50U qam, 10U qpm).  Reports running out of Novolin (Relion); last dose taken this morning.  Discontinue Novolin 70/30. Initiate Lantus Solostar 60U qam today and instructed on use pen.  Provided sample pen and needles.  Continue Metformin 1000mg  two times a day.  TTFFC: 35 minutes.  Seen with: Margaretha Sheffield, PharmD resident, and Carilyn Goodpasture, PharmD Candidate  His updated medication list for this problem includes:    Metformin Hcl 1000 Mg Tabs (Metformin hcl) .Marland Kitchen... 1 tablet twice a day for diabetes    Lantus Solostar 100 Unit/ml Soln (Insulin glargine) .Marland KitchenMarland KitchenMarland KitchenMarland Kitchen 60 units each morning.  dispense quantity sufficient for one month supply.    Adult Aspirin Ec Low Strength 81 Mg Tbec (Aspirin) .Marland Kitchen... 1 tablet by mouth daily  Orders: Reassessment Each 15 min unit- [place] [id number])  Problem # 2:  HYPERTENSION, BENIGN SYSTEMIC (ICD-401.1) Assessment: Deteriorated  BP uncontrolled at 173/84 and is not at goal (<130/80).  Reports nonadherence with  Amlodipine (due to peripheral edema) and states adherence with Metoprolol and HCTZ.  Continue to follow BP with Dr. Juleen China.    The following medications were removed from the medication list:    Norvasc 10 Mg Tabs (Amlodipine besylate) .Marland Kitchen... Take 1/2  tablet by mouth once a day for blood pressure His updated medication list for this problem includes:    Metoprolol Tartrate 100 Mg Tabs (Metoprolol tartrate) .Marland Kitchen... 1 tablet by mouth two times a day for blood pressure    Hydrochlorothiazide 25 Mg Tabs (Hydrochlorothiazide) .Marland Kitchen... 1 tablet  by mouth daily for blood pressure  Orders: Reassessment Each 15 min unit- [facility] [id number])  Complete Medication List: 1)  Isosorbide Mononitrate Cr 60 Mg Tb24 (Isosorbide mononitrate) .... Take 1 tablet by mouth once a day for blood pressure 2)  Metformin Hcl 1000 Mg Tabs (Metformin hcl) .Marland Kitchen.. 1 tablet twice a day for diabetes 3)  Paroxetine Hcl 20 Mg Tabs (Paroxetine hcl) .... One and one-half tabs by mouth daily 4)  Prevacid 30 Mg Cpdr (Lansoprazole) .... Take 1 capsule by mouth once a day for stomach 5)  Lantus Solostar 100 Unit/ml Soln (Insulin glargine) .... 60 units each morning.  dispense quantity sufficient for one month supply. 6)  Metoprolol Tartrate 100 Mg Tabs (Metoprolol tartrate) .Marland Kitchen.. 1 tablet by mouth two times a day for blood pressure 7)  Fish Oil Concentrate 1000 Mg Caps (Omega-3 fatty acids) .Marland Kitchen.. 1 tablet daily 8)  Adult Aspirin Ec Low Strength 81 Mg Tbec (Aspirin) .Marland Kitchen.. 1 tablet by mouth daily 9)  Nitrostat 0.4 Mg Subl (Nitroglycerin) .Marland Kitchen.. 1 tab by mouth sl as needed chest pain 10)  Multivitamins Tabs (Multiple vitamin) .... Take 1 once daily 11)  Hydrochlorothiazide 25 Mg Tabs (Hydrochlorothiazide) .Marland Kitchen.. 1 tablet by mouth daily for blood pressure 12)  Oxycontin 10 Mg Xr12h-tab (Oxycodone hcl) .Marland Kitchen.. 1 tablet by mouth three times a day- prescribed by pain doctor 13)  Simvastatin 10 Mg Tabs (Simvastatin) .Marland Kitchen.. 1 at bedtime  Patient Instructions: 1)  Stop Relion Insulin 2)  Start Lantus Solostar 60 units each morning.  3)  Continue to monitor your blood glucose at least once daily in the AM and then if you can please measure an additional time.   Remember first thing in the AM check you Blood sugar.  4)  Schedule appointment with Dr. Juleen China in next 2-3 weeks.  Prescriptions: SIMVASTATIN 10 MG TABS (SIMVASTATIN) 1 at bedtime  #30 x 5   Entered by:   Rinaldo Ratel D   Authorized by:   Briscoe Deutscher MD   Signed by:   Janeann Forehand Pharm D on 01/10/2009   Method used:   Faxed to  ...       Medication Assistance Program - [facility] (mail-order)       [address]       [place], [place]         Ph: [phone]       Fax: [phone]   RxID:   [id number] ISOSORBIDE MONONITRATE CR 60 MG TB24 (ISOSORBIDE MONONITRATE) Take 1 tablet by mouth once a day for blood pressure  #30 x 5   Entered by:   Janeann Forehand Pharm D   Authorized by:   Briscoe Deutscher MD   Signed by:   Janeann Forehand Pharm D on 01/10/2009   Method used:   Faxed to ...       Medication Assistance Program - [facility] (mail-order)  [address]       [place], [place]         Ph: [phone]       Fax: [phone]   RxID:   [id number] LANTUS SOLOSTAR 100 UNIT/ML  SOLN (INSULIN GLARGINE) 60 units each morning.  Dispense quantity sufficient for one month supply.  #1 x 11   Entered by:   Janeann Forehand Pharm D   Authorized by:   Briscoe Deutscher MD   Signed by:   Janeann Forehand Pharm D on 01/10/2009   Method used:   Faxed to ...       Medication Assistance Program - [facility] (mail-order)       [address]       [place], [place]         Ph: [phone]       Fax: [phone]   RxID:   [id number]   Prevention & Chronic Care Immunizations   Influenza vaccine: Fluvax Non-MCR  (02/25/2008)   Influenza vaccine due: 03/08/2008    Tetanus booster: 11/24/2001: Done.   Tetanus booster due: 11/25/2011    Pneumococcal vaccine: Done.  (03/27/2004)   Pneumococcal vaccine due: None  Colorectal Screening   Hemoccult: Done.  (07/26/2002)   Hemoccult due: Not Indicated    Colonoscopy: Done.  (10/26/2002)   Colonoscopy due: 10/25/2012  Other Screening   PSA: 0.52  (03/09/2007)   PSA due due: 03/08/2008   Smoking status: quit  (11/07/2008)  Diabetes Mellitus   HgbA1C: 8.1  (11/07/2008)   Hemoglobin A1C due: 12/23/2007    Eye exam: Not documented    Foot exam: yes  (11/07/2008)   High risk foot: Not documented   Foot care education: Not  documented   Foot exam due: 02/23/2009    Urine microalbumin/creatinine ratio: Not documented    Diabetes flowsheet reviewed?: Yes   Progress toward A1C goal: Unchanged  Lipids   Total Cholesterol: 110  (10/20/2008)   LDL: 59  (10/20/2008)   LDL Direct: Not documented   HDL: 40  (10/20/2008)   Triglycerides: 53  (10/20/2008)    SGOT (AST): 18  (10/20/2008)   SGPT (ALT): 20  (10/20/2008)   Alkaline phosphatase: 103  (10/20/2008)   Total bilirubin: 0.8  (10/20/2008)    Lipid flowsheet reviewed?: Yes   Progress toward LDL goal: At goal  Hypertension   Last Blood Pressure: 173 / 84  (01/10/2009)   Serum creatinine: 0.95  (10/20/2008)   Serum potassium 3.9  (10/20/2008)    Hypertension flowsheet reviewed?: Yes   Progress toward BP goal: Deteriorated  Self-Management Support :   Personal Goals (by the next clinic visit) :     Personal A1C goal: 8  (01/10/2009)     Personal blood pressure goal: 130/80  (01/10/2009)     Personal LDL goal: 100  (01/10/2009)    Diabetes self-management support: Written self-care plan  (01/10/2009)   Diabetes care plan printed    Hypertension self-management support: Not documented    Lipid self-management support: Not documented

## 2010-06-26 NOTE — Assessment & Plan Note (Signed)
Summary: FU/KH   Vital Signs:  Patient Profile:   60 Years Old Male Weight:      225 pounds Pulse rate:   65 / minute BP sitting:   137 / 85  Vitals Entered By: April Manson CMA (November 18, 2007 10:22 AM)                Flex Sig Next Due:  Not Indicated   Chief Complaint:  FU BP and DM.  History of Present Illness: HTN: better today than at last visit,  Using medications:stopped taking hyzaar when trying clonidine, states it made him feel bad.  pt is taking toprol xl 100mg , norvasc 5 mg, isosorbide mononitrate 60mg , ( hyzaar off for 3 week) clonidine 0.1mg , spironolactone 25mg .   Chest Pain: no [id number] 2-3 weeks ago, with working and Musician.  HA:no Leg Edema: off and on. Previous hx of CVA/TIA:no  DM:  stable on current meds, too early to recheck HbA1c.  still taking Tonga.   Using meds:yes Leg/foot pain:no Last Creatinine:see flowsheet Last HbA1C:see flowsheet Recent infections:no  tobacco cessation- pt is using the patch, but not on today or in last 3-4 days, smoking has decreased to 5 a day.        Current Allergies: ENALAPRIL MALEATE (ENALAPRIL MALEATE)  Past Medical History:    Nonobstructive coronary disease- Cardiologist Dr. Verl Blalock [facility])       Nuclear study low risk - 09/25/2005 EF 59%       Stress cardiolyte: neg for ischemia - 04/17/2004,        Cath/PTCA: EF 65%; stenting of mid-circumflex artery - 02/24/2002,       Sestamibi/treadmill cardiolyte: neg; EF 56% - 06/28/1999,     HTN      hx of hypokalemia    hyperlipidemia       Carotid doppler: normal - 06/27/1998,     Type II DM with complications: Diabetic macular edema, nephropathy (proteinuria), retinopathy,     Disability claim 06/1998 after fall at work,     h/o EtOH withdrawal/seizures/pancreatitis in,     h/o gastric ulcer           Colonoscopy: colon polyps (benign) - 10/26/2002,        Upper GI neg 2000,          Hpylori neg 2000        allergic rhinitis,     presbyopia, hyperopia,    hx of sciatica  Past Surgical History:    Cath/PTCA: EF 65%; stenting of mid-circumflex artery - 02/24/2002    Epidural injection for back pain - 01/25/2001,           Family History:    6 siblings: DM, HTN, gout, migraines,     Breast CA 1st degree,     Diabetes 1st degree,     Father: CHF, dementia,     Mother: DM, HTN, CVA, breast CA,       neg for CAD       Social History:    -Quit smoking in spring of 2004; -Employed at two part time jobs - Therapist, 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         Physical Exam  General:     Well-developed,well-nourished,in no acute distress; alert,appropriate and cooperative throughout examination Lungs:     Normal respiratory  effort, chest expands symmetrically. Lungs are clear to auscultation, no crackles or wheezes. Heart:     regular rhythm, no murmur, no gallop, no rub, no JVD, and bradycardia.      Impression & Recommendations:  Problem # 1:  HYPERTENSION, BENIGN SYSTEMIC (ICD-401.1) Assessment: Deteriorated would add back hyzaar during middle of day.  if symptoms return, would stop clonidine and consider adding hydralazine.  pt needs to be on ARB for hx of proteinuria and CAD.   His updated medication list for this problem includes:    Hyzaar 100-25 Mg Tabs (Losartan potassium-hctz) .Marland Kitchen... Take 1 tablet by mouth once a day    Norvasc 10 Mg Tabs (Amlodipine besylate) .Marland Kitchen... Take 1/2  tablet by mouth once a day    Toprol Xl 100 Mg Tb24 (Metoprolol succinate) .Marland Kitchen... 1 by mouth daily, not decrease in dose.    Spironolactone 25 Mg Tabs (Spironolactone) .Marland Kitchen... 1 tablet 1 time a day for blood pressure and to raise potassium    Clonidine Hcl 0.1 Mg Tabs (Clonidine hcl) .Marland Kitchen... 1 tablet twice a day for blood pressure  Orders: [place]- Est  Level 4 [id number])   Problem  # 2:  TOBACCO ABUSE (ICD-305.1) Assessment: Improved encouraged to contiue to decrease down to none.  still using patches some.   His updated medication list for this problem includes:    Eq Nicotine 14 Mg/24hr Pt24 (Nicotine) ..... Use 1 per day x 2 weeks, then go to 7 mg patch  Orders: [place]- Est  Level 4 [id number])   Problem # 3:  DIABETES MELLITUS, II, COMPLICATIONS (A999333) Assessment: Unchanged will stop [place] since on insulin.  will have pt return in 1 month for repeat HbA1c.   The following medications were removed from the medication list:    Januvia 100 Mg Tabs (Sitagliptin phosphate) .Marland Kitchen... 1 tablet a day  His updated medication list for this problem includes:    Hyzaar 100-25 Mg Tabs (Losartan potassium-hctz) .Marland Kitchen... Take 1 tablet by mouth once a day    Metformin Hcl 1000 Mg Tabs (Metformin hcl) .Marland Kitchen... 1 tablet twice a day for diabetes    Relion 70/30 70-30 % Susp (Insulin isophane & regular) ..... Inject 50 unit subcutaneously in am and 10 in pm.  disp qs    Aspirin 325 Mg Tabs (Aspirin) .Marland Kitchen... 1 tablet a day  Orders: [place]- Est  Level 4 (99214)   Complete Medication List: 1)  Hyzaar 100-25 Mg Tabs (Losartan potassium-hctz) .... Take 1 tablet by mouth once a day 2)  Isosorbide Mononitrate Cr 60 Mg Tb24 (Isosorbide mononitrate) .... Take 1 tablet by mouth once a day 3)  Lipitor 10 Mg Tabs (Atorvastatin calcium) .... Take 1 tablet by mouth every night 4)  Metformin Hcl 1000 Mg Tabs (Metformin hcl) .Marland Kitchen.. 1 tablet twice a day for diabetes 5)  Norvasc 10 Mg Tabs (Amlodipine besylate) .... Take 1/2  tablet by mouth once a day 6)  Paxil 30 Mg Tabs (Paroxetine hcl) .... Take 1 tablet by mouth once a day 7)  Prevacid 30 Mg Cpdr (Lansoprazole) .... Take 1 capsule by mouth once a day 8)  Relion 70/30 70-30 % Susp (Insulin isophane & regular) .... Inject 50 unit subcutaneously in am and 10 in pm.  disp qs 9)  Toprol Xl 100 Mg Tb24 (Metoprolol succinate) .Marland Kitchen.. 1 by mouth daily, not  decrease in dose. 10)  Bl Vitamin E Natural 400 Unit Caps (Vitamin e) .Marland Kitchen.. 1 tablet a day 11)  Vitamin C  .Marland KitchenMarland KitchenMarland Kitchen  1 tablet a day 12)  Fish Oil Concentrate 1000 Mg Caps (Omega-3 fatty acids) .Marland Kitchen.. 1 tablet daily 13)  Alba-lybe Tabs (B complex vitamins) .Marland Kitchen.. 1 tablet a day 14)  Aspirin 325 Mg Tabs (Aspirin) .Marland Kitchen.. 1 tablet a day 15)  Spironolactone 25 Mg Tabs (Spironolactone) .Marland Kitchen.. 1 tablet 1 time a day for blood pressure and to raise potassium 16)  Voltaren 1 % Gel (Diclofenac sodium) .... Two times a day  to qid as needed to elbow for pain. 17)  Celebrex 400 Mg Caps (Celecoxib) .Marland Kitchen.. 1 tablet for pain a day 18)  Eq Nicotine 14 Mg/24hr Pt24 (Nicotine) .... Use 1 per day x 2 weeks, then go to 7 mg patch 19)  Clonidine Hcl 0.1 Mg Tabs (Clonidine hcl) .Marland Kitchen.. 1 tablet twice a day for blood pressure   Patient Instructions: 1)  Restart your hyzaar at bedtime.  if this makes you not feel good, then stop clonidine by taking 1/2 tablet in AM and PM for 3 days, the 1/2 tablet in AM for 3 days, then stop.   2)  Please schedule a follow-up appointment in 1 month with Dr. [name]  to recheck DM, HbA1c, also to adjust your blood pressure medicines.  Marland Kitchen   ]

## 2010-06-26 NOTE — Miscellaneous (Signed)
Summary: can't afford meds  Clinical Lists Changes unable to but isosorbide or relion. message to pcp. we have lantus solostar sample only. plz advise. he has enough until tomorrow. He will be getting set up on [facility] thru the health dept but there is usually a lag time before drug companies process the requests & start mailing the meds.Marland KitchenMarland KitchenElige Radon RN  January 09, 2009 3:14 PM. Medications: Changed medication from RELION 70/30 70-30 % SUSP (INSULIN ISOPHANE & REGULAR) Inject 50 unit subcutaneously in AM and 10 in PM.  disp qs for 3 months to LANTUS SOLOSTAR 100 UNIT/ML  SOLN (INSULIN GLARGINE) use as directed - Signed Rx of LANTUS SOLOSTAR 100 UNIT/ML  SOLN (INSULIN GLARGINE) use as directed;  #3 boxes x 3;  Signed;  Entered by: Briscoe Deutscher MD;  Authorized by: Briscoe Deutscher MD;  Method used: Historical    Prescriptions: LANTUS SOLOSTAR 100 UNIT/ML  SOLN (INSULIN GLARGINE) use as directed  #3 boxes x 3   Entered and Authorized by:   Briscoe Deutscher MD   Signed by:   Briscoe Deutscher MD on 01/09/2009   Method used:   Historical   [id number]  INSULIN = Please give the patient a Lantus Solostar Pen (or more if available) and needles, and demonstrate how to use it. Instructions: Inject 45 units subcutaneously in the am. (This is  ~ 80% of his previous NPH dose). He will then need instructions for titration: Increase Lantus dose by 1 unit every day until blood sugar before breakfast (fasting) is less than 126. If blood sugar less than 80, decrease Lantus dose by 1 unit daily until over 80. He may also call the clinic for instructions. One pen should last about 6 days (at 45-50 units daily).   *****I will not be available tomorrow as I will be post-call, so please confirm this plan with Nicholas Lose. He may also wish for his student to demonstrate the use of the pen.   IMDUR = This medication is on the $4 list at [facility]. Please provide cash from the indigent fund if available for this  medication.  THIS PATIENT MUST GET [facility] PAPERWORK COMPLETED. This issue was first reported on 11/21/08.  ..Marland KitchenMarland KitchenBriscoe Deutscher, DO 01/09/09 9:30 PM  Appended Document: can't afford meds left message for pt to be here at 2pm today. asked that he call back to confirm he got the message. Dr. Valentina Lucks will see him then

## 2010-06-26 NOTE — Letter (Signed)
Summary: Generic Letter  [facility]  [address]   [place], [place] [address]   Phone: [phone]  Fax: [phone]    11/23/2008  MARCIANO MEGA [address] [place], [name]  [address]  Dear [place]:  We have given Mr. Ajello $8 to but 2 prescriptions that are on your $4 plan. Please sign this letter & fax back so I can track our outgoing disbursements. Joylene Igo [phone] attention Gay Filler       Sincerely,   Elige Radon RN

## 2010-06-26 NOTE — Miscellaneous (Signed)
Summary: rx issues  Clinical Lists Changes called [facility] . his metformin, isosorbide & HCTZ are all $9.99 for a 90 day supply. the simvastatin would need to be changed to lovastatin or pravachol to get the same pricing. The norvasc would need to be changed to [place] or cartia xt to get same pricing.   pharmacist also related several RX for pain. he is getting them thru a worker's comp md. He does not think he needs any of them yet. will check his supplies at home & let us know if he needs assistance. asked him to bring bottles with him in the future & schedule appt with new pcp within the next 2 months.message to pcp about changing the rxs.Marland KitchenMarland KitchenElige Radon RN  November 25, 2008 3:08 PM     Because I have never seen this patient in clinic and he has a cardiologist Press photographer, last seen 08/30/08 and medication affordability addressed then) as well as a pain specialist, I will defer those medication changes to them.   Appended Document: Orders Update    Clinical Lists Changes  Medications: Changed medication from SIMVASTATIN 20 MG TABS (SIMVASTATIN) 1 tab at bedtime to LOVASTATIN 20 MG TABS (LOVASTATIN) Take 1 tab by mouth daily - Signed Rx of LOVASTATIN 20 MG TABS (LOVASTATIN) Take 1 tab by mouth daily;  #90 x 3;  Signed;  Entered by: Briscoe Deutscher MD;  Authorized by: Briscoe Deutscher MD;  Method used: Electronically to [facility] [address] [phone]*, [place], [place], [place], [place]  [address], Ph: [phone] or [phone], Fax: [phone]    Prescriptions: LOVASTATIN 20 MG TABS (LOVASTATIN) Take 1 tab by mouth daily  #90 x 3   Entered and Authorized by:   Briscoe Deutscher MD   Signed by:   Briscoe Deutscher MD on 12/01/2008   Method used:   Electronically to        [facility] [address] [phone]* (retail)       [address]       [place], [place]  [address]       Ph: [phone] or [phone]       Fax: [phone]   RxID:   [id number]

## 2010-06-26 NOTE — Assessment & Plan Note (Signed)
Summary: check up/dm/el   Vital Signs:  Patient Profile:   60 Years Old Male Weight:      221 pounds Pulse rate:   57 / minute BP sitting:   170 / 91  Vitals Entered By: April Manson CMA (September 23, 2007 10:18 AM)                 Serial Vital Signs/Assessments:  Time      Position  BP       Pulse  Resp  Temp     By                     160/82                         Mosetta Ferdinand  MD   Chief Complaint:  fu dm, htn, and tobacco.  History of Present Illness: DM: wt down 9 lbs since last week.   Home sugars ranging: pt has not been lazy on checking sugars lately. Using meds: 70/30 50 in AM , 10 at PM.  Leg/foot pain: no Last Creatinine:see flowsheet Last HbA1C:see flowsheet Recent infections:no   HTN: elevated today since being on patches.   Using medications:yes  Chest Pain: no SOB: no HA:no Leg Edema: every now and then, goes down at nighttime.  Previous hx of CVA/TIA: no  complaining of difficulty getting out of bed in am, hard to have 'get up and go'  tobacco- using patches, 14mg  patches, smoking 1 cigerette every 12 hours.       Current Allergies: ENALAPRIL MALEATE (ENALAPRIL MALEATE)  Past Medical History:    Nonobstructive coronary disease- Cardiologist Dr. Verl Blalock [facility])       Nuclear study low risk - 09/25/2005 EF 59%       Stress cardiolyte: neg for ischemia - 04/17/2004,        Cath/PTCA: EF 65%; stenting of mid-circumflex artery - 02/24/2002,       Sestamibi/treadmill cardiolyte: neg; EF 56% - 06/28/1999,     HTN    hx of hypokalemia    hyperlipidemia       Carotid doppler: normal - 06/27/1998,     Type II DM with complications: Diabetic macular edema, nephropathy (proteinuria), retinopathy,     Disability claim 06/1998 after fall at work,     h/o EtOH withdrawal/seizures/pancreatitis in,     h/o gastric ulcer         Colonoscopy: colon polyps (benign) - 10/26/2002,        Upper GI neg 2000,          Hpylori neg 2000        allergic rhinitis,   presbyopia, hyperopia,     hx of sciatica  Past Surgical History:    Cath/PTCA: EF 65%; stenting of mid-circumflex artery - 02/24/2002    Epidural injection for back pain - 01/25/2001,         Family History:    6 siblings: DM, HTN, gout, migraines,     Breast CA 1st degree,     Diabetes 1st degree,     Father: CHF, dementia,     Mother: DM, HTN, CVA, breast CA,     neg for CAD     Social History:    -Quit smoking in spring of 2004; -Employed at two part time jobs - Therapist, 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    Review of Systems  CV      Denies chest pain or discomfort, shortness of breath with exertion, and swelling of feet.   Physical Exam  General:     Well-developed,well-nourished,in no acute distress; alert,appropriate and cooperative throughout examination, obese Neck:     No deformities, masses, or tenderness noted. Lungs:     Normal respiratory effort, chest expands symmetrically. Lungs are clear to auscultation, no crackles or wheezes. Heart:     regular rhythm, no murmur, no gallop, no rub, no JVD, and bradycardia.      Impression & Recommendations:  Problem # 1:  HYPERTENSION, BENIGN SYSTEMIC (ICD-401.1) Assessment: Deteriorated manual recheck is 160/82, but pulse noted to be 57.  pt complaining of fatigue in am.  pt did not take his toprol xl last night for which he is on a large dose.  pt to check bp and pulse for next week and bring in a list for me to review.  may need to back off toprol.   His updated medication list for this problem includes:    Hyzaar 100-25 Mg Tabs (Losartan potassium-hctz) .Marland Kitchen... Take 1 tablet by mouth once a day    Norvasc 10 Mg Tabs (Amlodipine besylate) .Marland Kitchen... Take 1/2  tablet by mouth once a day    Toprol Xl 200 Mg Tb24 (Metoprolol succinate) .Marland Kitchen...  1 tablet one time a day for blood pressure    Spironolactone 25 Mg Tabs (Spironolactone) .Marland Kitchen... 1 tablet 1 time a day for blood pressure and to raise potassium  Orders: [place]- Est  Level 4 (99214) Comp Met-[facility] [id number])   Problem # 2:  DIABETES MELLITUS, II, COMPLICATIONS (A999333) Assessment: Unchanged check hba1c today.  cont 70/30.  encouraged activity.   His updated medication list for this problem includes:    Hyzaar 100-25 Mg Tabs (Losartan potassium-hctz) .Marland Kitchen... Take 1 tablet by mouth once a day    Metformin Hcl 1000 Mg Tabs (Metformin hcl) .Marland Kitchen... 1 tablet twice a day for diabetes    Relion 70/30 70-30 % Susp (Insulin isophane & regular) ..... Inject 50 unit subcutaneously in am and 10 in pm.  disp qs    Aspirin 325 Mg Tabs (Aspirin) .Marland Kitchen... 1 tablet a day    Januvia 100 Mg Tabs (Sitagliptin phosphate) .Marland Kitchen... 1 tablet a day  Orders: [place]- Est  Level 4 (99214) A1C-[facility] [id number])   Problem # 3:  TOBACCO ABUSE (ICD-305.1) Assessment: Improved congrats to pt.  continue with patches.   His updated medication list for this problem includes:    Eq Nicotine 14 Mg/24hr Pt24 (Nicotine) ..... Use 1 per day x 2 weeks, then go to 7 mg patch  Orders: [place]- Est  Level 4 [id number])   Problem # 4:  FATIGUE (ICD-780.79) Assessment: New will check for anemia and thyroid, however I believe this is probably related to large dose of toprol pt takes.   Orders: TSH-[facility] [id number])   Complete Medication List: 1)  Hyzaar 100-25 Mg Tabs (Losartan potassium-hctz) .... Take 1 tablet by mouth once a day 2)  Isosorbide Mononitrate Cr 60 Mg Tb24 (Isosorbide mononitrate) .... Take 1 tablet by mouth once a day 3)  Lipitor 10 Mg Tabs (Atorvastatin calcium) .... Take 1 tablet by mouth every night 4)  Metformin Hcl 1000 Mg Tabs (Metformin hcl) .Marland Kitchen.. 1 tablet twice a day for diabetes 5)  Norvasc 10 Mg Tabs (Amlodipine besylate) .Marland KitchenMarland KitchenMarland Kitchen  Take 1/2  tablet by mouth once a day 6)  Paxil 30 Mg Tabs (Paroxetine hcl)  .... Take 1 tablet by mouth once a day 7)  Prevacid 30 Mg Cpdr (Lansoprazole) .... Take 1 capsule by mouth once a day 8)  Relion 70/30 70-30 % Susp (Insulin isophane & regular) .... Inject 50 unit subcutaneously in am and 10 in pm.  disp qs 9)  Toprol Xl 200 Mg Tb24 (Metoprolol succinate) .Marland Kitchen.. 1 tablet one time a day for blood pressure 10)  Bl Vitamin E Natural 400 Unit Caps (Vitamin e) .Marland Kitchen.. 1 tablet a day 11)  Vitamin C  .... 1 tablet a day 12)  Fish Oil Concentrate 1000 Mg Caps (Omega-3 fatty acids) .Marland Kitchen.. 1 tablet daily 13)  Alba-lybe Tabs (B complex vitamins) .Marland Kitchen.. 1 tablet a day 14)  Aspirin 325 Mg Tabs (Aspirin) .Marland Kitchen.. 1 tablet a day 15)  Spironolactone 25 Mg Tabs (Spironolactone) .Marland Kitchen.. 1 tablet 1 time a day for blood pressure and to raise potassium 16)  Januvia 100 Mg Tabs (Sitagliptin phosphate) .Marland Kitchen.. 1 tablet a day 17)  Voltaren 1 % Gel (Diclofenac sodium) .... Two times a day  to qid as needed to elbow for pain. 18)  Celebrex 400 Mg Caps (Celecoxib) .Marland Kitchen.. 1 tablet for pain a day 19)  Eq Nicotine 14 Mg/24hr Pt24 (Nicotine) .... Use 1 per day x 2 weeks, then go to 7 mg patch  Other Orders: CBC-[facility] [id number])   Patient Instructions: 1)  check Blood pressure in AM and Pm for next 1 week.  2)  If ever your pulse is less than 55, then give [place] a call.   3)  drop off a list of your blood pressure readings for 1 week by the office for me to review.      ]  Appended Document: A1c report    Lab Visit   Laboratory Results   Blood Tests   Date/Time Received: September 23, 2007 10:44 AM  Date/Time Reported: September 23, 2007 11:40 AM   HGBA1C: 6.8%   (Normal Range: Non-Diabetic - 3-6%   Control Diabetic - 6-8%)  Comments: ...............test performed by......Marland KitchenBonnie A. Martin[place], [place] (ASCP)    Much improved! Orders Today:

## 2010-06-26 NOTE — Progress Notes (Signed)
Summary: wi request  Phone Note Call from Patient Call back at Home Phone [phone]   Reason for Call: Talk to Nurse Summary of Call: pain through left shoulder and wants to know if he can be worked in Initial call taken by: Samara Snide,  March 10, 2008 11:37 AM  Follow-up for Phone Call        Pt states he has pain through his left shoulder down into his chest- pain started last night, and still there.  Pt states it was hurting so much he could barely move his arm.  States he took a tylenol this morning and it seems some better today.  Given pt's cardiac history - and he has a cardiologist - advised him to call their office to see if he could be evaluated there to make sure this is not angina. Dr. Dorathy Daft agrees that pt should contact his cardiologist, or go to ED if they cannot see him there.   Pt agreeabe.  Advised him to call back if he has problems or questions. Follow-up by: AMY MARTIN RN,  March 10, 2008 12:00 PM

## 2010-06-26 NOTE — Progress Notes (Signed)
Summary: Rx Prob  Phone Note Call from Patient Call back at Home Phone [phone]   Caller: Patient Summary of Call: pt needs refill insulin has not had any since Weds. had checked with pharmacy and they said that they had called over for it, but did not see anything in system.  [facility]. Initial call taken by: Raymond Gurney,  Oct 14, 2008 9:12 AM  Follow-up for Phone Call        rx called to pharmacy. patient notified. Follow-up by: Marcell Barlow RN,  Oct 14, 2008 9:47 AM

## 2010-06-26 NOTE — Letter (Signed)
Summary: Out of Work  [facility]  [address]   [place], [place] [address]   Phone: [phone]  Fax: [phone]    August 15, 2009   Employee:  ULAS METZKER    To Whom It May Concern:   For Medical reasons, please excuse the above named employee from work for the following dates:  Start:   August 15, 2009  End:  August 15, 2009  If you need additional information, please feel free to contact our office.         Sincerely,    Isabelle Course

## 2010-06-26 NOTE — Letter (Signed)
Summary: [facility] Lipid Letter  [facility]  [address]   [place], [place] [address]   Phone: [phone]  Fax: [phone]    10/21/2008 MRN: [id number]  Anthony [name] [address] [place], [place]  [address]  Dear Anthony [name]:  We have carefully reviewed your last lipid profile from 10/20/2008 and the results are noted below with a summary of recommendations for lipid management.    Cholesterol:       110     Goal: <200   HDL "good" Cholesterol:   40     Goal: >40   LDL "bad" Cholesterol:   59               Goal: < 70   Triglycerides:       53     Goal: <150   Your cholesterol is PERFECT.  Let's continue your same cholesterol medications.  Your electrolytes, liver, and kidney function were all normal except your sugar was 228, which is too high.  If you have any questions, please call. We appreciate being able to work with you.   Sincerely,   [name] MD [facility] [name] MD     Appended Document: [facility] Lipid Letter Mailed.

## 2010-06-26 NOTE — Letter (Signed)
Summary: [facility]  [address]   [place], [place] [address]   Phone: [phone]  Fax: [phone]    12/27/2009  Re: Anthony [name] [address] [place], [place]  [address] Phone: [phone]  To whom it may concern,  The purpose of this letter is to document significant chronic health conditions that affect 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 I recommend that he recieve disability status based on his mulitple medical problems and physical demands of his current job.  I have been his primary care physician for 1 year and have seen him multiple times over that year for complications related to his chronic medical conditions.  Current Medications: 1)  ISOSORBIDE MONONITRATE CR 60 MG TB24 (ISOSORBIDE MONONITRATE) Take 1 tablet by mouth once a day for blood pressure 2)  METFORMIN HCL 1000 MG TABS (METFORMIN HCL) 1 tablet twice a day for diabetes 3)  PAROXETINE HCL 20 MG  TABS (PAROXETINE HCL) one and one-half tabs by mouth daily 4)  PREVACID 30 MG CPDR (LANSOPRAZOLE) Take 1 capsule by mouth once a day for stomach 5)  NOVOLIN 70/30 70-30 % SUSP (INSULIN ISOPHANE & REGULAR) 50 units in the am and 10 units in the pm 6)  METOPROLOL TARTRATE 100 MG TABS (METOPROLOL TARTRATE) 1 tablet by mouth two times a day for blood pressure 7)  FISH OIL CONCENTRATE 1000 MG  CAPS (OMEGA-3 FATTY ACIDS) 1 tablet daily 8)  ADULT ASPIRIN EC LOW STRENGTH 81 MG TBEC (ASPIRIN) 1 tablet by mouth daily 9)  NITROSTAT 0.4 MG SUBL (NITROGLYCERIN) 1 tab by mouth SL as needed chest pain 10)  MULTIVITAMINS   TABS (MULTIPLE VITAMIN) Take 1 once daily 11)  OXYCONTIN 10 MG XR12H-TAB (OXYCODONE HCL) 1 tablet by mouth three times a day- prescribed by pain doctor 12)  SIMVASTATIN 10 MG TABS (SIMVASTATIN) 1 at bedtime 13)  LASIX 20 MG TABS (FUROSEMIDE) take 2 by mouth daily 14)  LOSARTAN POTASSIUM 50 MG TABS (LOSARTAN POTASSIUM) one by mouth q day 15)   POTASSIUM CHLORIDE CR 10 MEQ  TBCR (POTASSIUM CHLORIDE) 2 by mouth daily 16)  HYDROCODONE-ACETAMINOPHEN 10-325 MG TABS (HYDROCODONE-ACETAMINOPHEN) 1 by mouth up to 4 time per day as needed for pain   Past Medical History: 1)  Nonobstructive Coronary Disease - Cardiologist Dr. Verl Blalock ([place])      Myoview: EF 68%, no ischemia - 2009      Nuclear study low risk:  EF 59% - 09/25/2005      Stress cardiolyte: neg for ischemia - 04/17/2004      Cath/PTCA: EF 65%; drug eluting stent of mid-circumflex artery - 02/24/2002      Sestamibi/treadmill cardiolyte: neg; EF 56% - 06/28/1999    2)  HTN   3)  HLD 4)  Carotid Doppler: normal - 06/27/1998  5)  Type II DM with complications:       Diabetic Macular Edema, Nephropathy (proteinuria), Retinopathy, Neuropathy 6)  Hx EtOH withdrawal/seizures/pancreatitis 7)  Hx Gastric Ulcer     8)  Colonoscopy: colon polyps (benign) - 10/26/2002 9)  Presbyopia, Hyperopia 10)  Hx chronic low back pain - Rx oxycontin TID by pain specialist (worker's comp case) 11)  Kidney Stones 12) Depression 13) Bilateral Venous Insufficiency 14) Mild Urge Incontinence 15) Obesity  Please contact us if you have any further questions or need additional information.  Sincerely,  Briscoe Deutscher DO  Appended Document: Disability mailed

## 2010-06-26 NOTE — Assessment & Plan Note (Signed)
Summary: L toe swollen & sore/[place]/wallace   Vital Signs:  Patient profile:   60 year old male Weight:      216.4 pounds Pulse rate:   80 / minute BP sitting:   143 / 92  (right arm)  Vitals Entered By: Mauricia Area CMA, (August 10, 2009 10:57 AM) CC: left little toe swollen x 5 days. sore to touch Is Patient Diabetic? Yes Pain Assessment Patient in pain? no        Primary Care Provider:  Briscoe Deutscher DO  CC:  left little toe swollen x 5 days. sore to touch.  History of Present Illness: 62M with DM2, toe swelling.  Present several days, has been trying to soak it in warm water.  No fevers/chills, no swollen glands, mild whitish discharge from proximal nail fold per pt.   Was being treated for onychomycosis however did not complete treatment as he could not afford the medicine.  Habits & Providers  Alcohol-Tobacco-Diet     Tobacco Status: quit  Current Medications (verified): 1)  Isosorbide Mononitrate Cr 60 Mg Tb24 (Isosorbide Mononitrate) .... Take 1 Tablet By Mouth Once A Day For Blood Pressure 2)  Metformin Hcl 1000 Mg Tabs (Metformin Hcl) .Marland Kitchen.. 1 Tablet Twice A Day For Diabetes 3)  Paroxetine Hcl 20 Mg  Tabs (Paroxetine Hcl) .... One and One-Half Tabs By Mouth Daily 4)  Prevacid 30 Mg Cpdr (Lansoprazole) .... Take 1 Capsule By Mouth Once A Day For Stomach 5)  Novolin 70/30 70-30 % Susp (Insulin Isophane & Regular) .... 50 Units in The Am and 10 Units in The Pm 6)  Metoprolol Tartrate 100 Mg Tabs (Metoprolol Tartrate) .Marland Kitchen.. 1 Tablet By Mouth Two Times A Day For Blood Pressure 7)  Fish Oil Concentrate 1000 Mg  Caps (Omega-3 Fatty Acids) .Marland Kitchen.. 1 Tablet Daily 8)  Adult Aspirin Ec Low Strength 81 Mg Tbec (Aspirin) .Marland Kitchen.. 1 Tablet By Mouth Daily 9)  Nitrostat 0.4 Mg Subl (Nitroglycerin) .Marland Kitchen.. 1 Tab By Mouth Sl As Needed Chest Pain 10)  Multivitamins   Tabs (Multiple Vitamin) .... Take 1 Once Daily 11)  Oxycontin 10 Mg Xr12h-Tab (Oxycodone Hcl) .Marland Kitchen.. 1 Tablet By Mouth Three Times A  Day- Prescribed By Pain Doctor 12)  Simvastatin 10 Mg Tabs (Simvastatin) .Marland Kitchen.. 1 At Bedtime 13)  Lasix 20 Mg Tabs (Furosemide) .... Take 1 By Mouth Daily 14)  Losartan Potassium 50 Mg Tabs (Losartan Potassium) .... One By Mouth Q Day 15)  Penicillin V Potassium 500 Mg Tabs (Penicillin V Potassium) .... One Tab Three Times A Day For 10 Days 16)  Doxycycline Hyclate 100 Mg Caps (Doxycycline Hyclate) .... One By Mouth Two Times A Day X 7 Days  Allergies (verified): 1)  Enalapril Maleate (Enalapril Maleate)  Social History: Smoking Status:  quit  Review of Systems       See HPI  Physical Exam  General:  Well-developed,well-nourished,in no acute distress; alert,appropriate and cooperative throughout examination Extremities:  Right 5th toe red, swollen, painful, some purulence seen draining from proximal nail crease.  Good cap refill.  Very tender.   Toenails all thickened, yellowish, dystrophic. Additional Exam:  Needle decompression of eponychia.  Attempted to drain Eponychia with ethyl chloride spray and needle under proximal nail fold in a sterile fashion.  No further pus expressed.   Impression & Recommendations:  Problem # 1:  PARONYCHIA, TOE (ICD-681.11) Assessment New Eponychia to be exact, unable to drain further purulence.  Will treat with doxy for MRSA coverage.  Toradol  in office for pain.  RTC 1 week.  Warm compresses.  I spent over 25 minutes with this patient.  His updated medication list for this problem includes:    Doxycycline Hyclate 100 Mg Caps (Doxycycline hyclate) ..... One by mouth two times a day x 7 days  Orders: [facility]- Est  Level 4 [id number])  Complete Medication List: 1)  Isosorbide Mononitrate Cr 60 Mg Tb24 (Isosorbide mononitrate) .... Take 1 tablet by mouth once a day for blood pressure 2)  Metformin Hcl 1000 Mg Tabs (Metformin hcl) .Marland Kitchen.. 1 tablet twice a day for diabetes 3)  Paroxetine Hcl 20 Mg Tabs (Paroxetine hcl) .... One and one-half tabs by mouth  daily 4)  Prevacid 30 Mg Cpdr (Lansoprazole) .... Take 1 capsule by mouth once a day for stomach 5)  Novolin 70/30 70-30 % Susp (Insulin isophane & regular) .... 50 units in the am and 10 units in the pm 6)  Metoprolol Tartrate 100 Mg Tabs (Metoprolol tartrate) .Marland Kitchen.. 1 tablet by mouth two times a day for blood pressure 7)  Fish Oil Concentrate 1000 Mg Caps (Omega-3 fatty acids) .Marland Kitchen.. 1 tablet daily 8)  Adult Aspirin Ec Low Strength 81 Mg Tbec (Aspirin) .Marland Kitchen.. 1 tablet by mouth daily 9)  Nitrostat 0.4 Mg Subl (Nitroglycerin) .Marland Kitchen.. 1 tab by mouth sl as needed chest pain 10)  Multivitamins Tabs (Multiple vitamin) .... Take 1 once daily 11)  Oxycontin 10 Mg Xr12h-tab (Oxycodone hcl) .Marland Kitchen.. 1 tablet by mouth three times a day- prescribed by pain doctor 12)  Simvastatin 10 Mg Tabs (Simvastatin) .Marland Kitchen.. 1 at bedtime 13)  Lasix 20 Mg Tabs (Furosemide) .... Take 1 by mouth daily 14)  Losartan Potassium 50 Mg Tabs (Losartan potassium) .... One by mouth q day 15)  Penicillin V Potassium 500 Mg Tabs (Penicillin v potassium) .... One tab three times a day for 10 days 16)  Doxycycline Hyclate 100 Mg Caps (Doxycycline hyclate) .... One by mouth two times a day x 7 days  Other Orders: Ketorolac-Toradol 15mg  [id number]) Prescriptions: DOXYCYCLINE HYCLATE 100 MG CAPS (DOXYCYCLINE HYCLATE) One by mouth two times a day x 7 days  #14 x 0   Entered and Authorized by:   Aundria Mems MD   Signed by:   Aundria Mems MD on 08/10/2009   Method used:   Print then Give to Patient   RxID:   [id number]    Medication Administration  Injection # 1:    Medication: Ketorolac-Toradol 15mg     Diagnosis: INFECTION, TOE ([id number])    Route: IM    Site: L deltoid    Exp Date: 11/2010    Lot #: [id number]    Mfr: [email or web address]    Comments: 30mg /75ml given    Patient tolerated injection without complications    Given by: Schuyler Amor CMA (August 10, 2009 12:17 PM)  Orders Added: 1)  [facility]- Est  Level 4 [99214] 2)   Ketorolac-Toradol 15mg  [id number]

## 2010-06-26 NOTE — Assessment & Plan Note (Signed)
Summary: FU/KH   Vital Signs:  Patient profile:   60 year old male Height:      72 inches Weight:      220.56 pounds BMI:     30.02 Temp:     97.4 degrees F oral Pulse rate:   68 / minute Pulse rhythm:   regular BP sitting:   158 / 80  (right arm)  Vitals Entered By: Janeth Rase LPN (September  2, [id number] 9:24 AM) CC: Follow up for DM.  c/o ankle edema "sometimes". Is Patient Diabetic? Yes  Pain Assessment Patient in pain? no        Primary Care Provider:  Briscoe Deutscher DO  CC:  Follow up for DM.  c/o ankle edema "sometimes"..  History of Present Illness: Mr. Scarpinato is a 60 year old AAM presenting for DM, HTN, peripheral edema.  1. DM: seen in pharmacy clinic on 01/10/09. please see pharmacy clinic note for more details. Rx changed to Lantus Solostar because he could not afford his Novolin 70/30 and the [facility] has samples of the Solostar Pen. he was hoping to use the MAP to get his meds, but they were unable to help him. he went back to Relion 70/30 at previous dose, metformin 1000 mg bid, and ASA. he has not been checking his blood sugar at home. A1c = 8.1 n 11/07/08. he denies numbness/tingling in his hands or feet, he does not check his feet regularly.  2. HTN: goal 130/80. Rx metoprolol, HCTZ. he was on amlodipine but has not taken it is one month because he ran out. he endorses LE edema on amlodipine as well. her denies CP, SOB, cough, N/V/D, HA, vision changes. He endorses LE edema and 2 pillow orthopnea.   3. CAD: cardiologist Dr. Verl Blalock with [facility]. has not seen since last year.    Habits & Providers  Alcohol-Tobacco-Diet     Alcohol drinks/day: 0     Tobacco Status: quit  Current Medications (verified): 1)  Isosorbide Mononitrate Cr 60 Mg Tb24 (Isosorbide Mononitrate) .... Take 1 Tablet By Mouth Once A Day For Blood Pressure 2)  Metformin Hcl 1000 Mg Tabs (Metformin Hcl) .Marland Kitchen.. 1 Tablet Twice A Day For Diabetes 3)  Paroxetine Hcl 20 Mg  Tabs (Paroxetine Hcl) .... One  and One-Half Tabs By Mouth Daily 4)  Prevacid 30 Mg Cpdr (Lansoprazole) .... Take 1 Capsule By Mouth Once A Day For Stomach 5)  Novolin 70/30 70-30 % Susp (Insulin Isophane & Regular) .... 50 Units in The Am and 10 Units in The Pm 6)  Metoprolol Tartrate 100 Mg Tabs (Metoprolol Tartrate) .Marland Kitchen.. 1 Tablet By Mouth Two Times A Day For Blood Pressure 7)  Fish Oil Concentrate 1000 Mg  Caps (Omega-3 Fatty Acids) .Marland Kitchen.. 1 Tablet Daily 8)  Adult Aspirin Ec Low Strength 81 Mg Tbec (Aspirin) .Marland Kitchen.. 1 Tablet By Mouth Daily 9)  Nitrostat 0.4 Mg Subl (Nitroglycerin) .Marland Kitchen.. 1 Tab By Mouth Sl As Needed Chest Pain 10)  Multivitamins   Tabs (Multiple Vitamin) .... Take 1 Once Daily 11)  Hydrochlorothiazide 25 Mg Tabs (Hydrochlorothiazide) .Marland Kitchen.. 1 Tablet By Mouth Daily For Blood Pressure 12)  Oxycontin 10 Mg Xr12h-Tab (Oxycodone Hcl) .Marland Kitchen.. 1 Tablet By Mouth Three Times A Day- Prescribed By Pain Doctor 13)  Simvastatin 10 Mg Tabs (Simvastatin) .Marland Kitchen.. 1 At Bedtime 14)  Lasix 20 Mg Tabs (Furosemide) .... Take 1/2 By Mouth Daily 15)  Cozaar 25 Mg Tabs (Losartan Potassium) .... Take On By Mouth Daily  Allergies (verified):  1)  Enalapril Maleate (Enalapril Maleate)  Past History:  Social History: Last updated: 09/16/2008 Works at [employer], but hours recently cut to part-time and he recently lost his health insurance. -No alcohol currently (h/o heavy use--sober since [id number]); -Denies illicit drug use; -Married to [employer]; -Has 2 biological children & 2 step-children.  Quiting smoking currently.      Past Medical History: Nonobstructive coronary disease- Cardiologist Dr. Verl Blalock [facility])    Nuclear study low risk - 09/25/2005 EF 59%    Stress cardiolyte: neg for ischemia - 04/17/2004,     Cath/PTCA: EF 65%; drug eluting stent of mid-circumflex artery - 02/24/2002,    Sestamibi/treadmill cardiolyte: neg; EF 56% - 06/28/1999    Nuclear stress test negative for inschemia- 8/09 HTN   Hyperlipidemia Carotid doppler: normal -  06/27/1998  Type II DM with complications: Diabetic macular edema, nephropathy (proteinuria), retinopathy h/o EtOH withdrawal/seizures/pancreatitis h/o gastric ulcer        Colonoscopy: colon polyps (benign) - 10/26/2002    Upper GI neg 2000    Hpylori neg 2000 Presbyopia, hyperopia h/o chronic low back pain- prescribed oxycontin three times a day by pain specialist (worker's comp case)  Review of Systems       per HPI, otherwise negative  Physical Exam  General:  Well-developed, well-nourished, in no acute distress; alert, appropriate and cooperative throughout examination. Vital signs reviewed. Lungs:  Normal respiratory effort, chest expands symmetrically. Lungs are clear to auscultation, no crackles or wheezes. Heart:  regular rhythm, no murmur, no gallop, no rub, no JVD. Pulses:  2 + dp Extremities:  1+ pitting bilateral ankles Skin:  stasis changes to lower legs bilaterally   Impression & Recommendations:  Problem # 1:  HYPERTENSION, BENIGN SYSTEMIC (ICD-401.1) Assessment Unchanged Discussed medication options to decrease BP. Since patient has DM, goal <130/80 and would benefit from ACE/ARB. Cozaar generic. This is also a good choice to hold K when talking Laix/HCTZ. Patient with s/s of mild fluid overload. His last stress Myoview was 12/2007 and showed an ejection fraction 68% with no ischemia. Will start very low dose Lasix and recheck BP, BMP in 1 week.  His updated medication list for this problem includes:    Metoprolol Tartrate 100 Mg Tabs (Metoprolol tartrate) .Marland Kitchen... 1 tablet by mouth two times a day for blood pressure    Hydrochlorothiazide 25 Mg Tabs (Hydrochlorothiazide) .Marland Kitchen... 1 tablet by mouth daily for blood pressure    Lasix 20 Mg Tabs (Furosemide) .Marland Kitchen... Take 1/2 by mouth daily    Cozaar 25 Mg Tabs (Losartan potassium) .Marland Kitchen... Take on by mouth daily  Orders: [facility]- Est Level  3 ([id number])Future Orders: Basic Met-[facility] [id number]) ... 02/02/2010  Problem # 2:  DIABETES  MELLITUS, II, COMPLICATIONS ([id number]) Assessment: Unchanged  Advised patient to get new glucometer as soon as he can afford it. Check BG regularly. A1c due in one month.   His updated medication list for this problem includes:    Metformin Hcl 1000 Mg Tabs (Metformin hcl) .Marland Kitchen... 1 tablet twice a day for diabetes    Novolin 70/30 70-30 % Susp (Insulin isophane & regular) .Marland KitchenMarland KitchenMarland KitchenMarland Kitchen 50 units in the am and 10 units in the pm    Adult Aspirin Ec Low Strength 81 Mg Tbec (Aspirin) .Marland Kitchen... 1 tablet by mouth daily    Cozaar 25 Mg Tabs (Losartan potassium) .Marland Kitchen... Take on by mouth daily  Orders: [place]- Est Level  3 [id number])  Problem # 3:  CORONARY, ARTERIOSCLEROSIS (ICD-414.00)  His updated medication list for  this problem includes:    Isosorbide Mononitrate Cr 60 Mg Tb24 (Isosorbide mononitrate) .Marland Kitchen... Take 1 tablet by mouth once a day for blood pressure    Metoprolol Tartrate 100 Mg Tabs (Metoprolol tartrate) .Marland Kitchen... 1 tablet by mouth two times a day for blood pressure    Adult Aspirin Ec Low Strength 81 Mg Tbec (Aspirin) .Marland Kitchen... 1 tablet by mouth daily    Nitrostat 0.4 Mg Subl (Nitroglycerin) .Marland Kitchen... 1 tab by mouth sl as needed chest pain    Hydrochlorothiazide 25 Mg Tabs (Hydrochlorothiazide) .Marland Kitchen... 1 tablet by mouth daily for blood pressure    Lasix 20 Mg Tabs (Furosemide) .Marland Kitchen... Take 1/2 by mouth daily    Cozaar 25 Mg Tabs (Losartan potassium) .Marland Kitchen... Take on by mouth daily  Orders: [place]- Est Level  3 [id number])  Problem # 4:  HYPERCHOLESTEROLEMIA (ICD-272.0) Assessment: Improved Continue current treatment. His updated medication list for this problem includes:    Simvastatin 10 Mg Tabs (Simvastatin) .Marland Kitchen... 1 at bedtime  Complete Medication List: 1)  Isosorbide Mononitrate Cr 60 Mg Tb24 (Isosorbide mononitrate) .... Take 1 tablet by mouth once a day for blood pressure 2)  Metformin Hcl 1000 Mg Tabs (Metformin hcl) .Marland Kitchen.. 1 tablet twice a day for diabetes 3)  Paroxetine Hcl 20 Mg Tabs (Paroxetine hcl) .... One  and one-half tabs by mouth daily 4)  Prevacid 30 Mg Cpdr (Lansoprazole) .... Take 1 capsule by mouth once a day for stomach 5)  Novolin 70/30 70-30 % Susp (Insulin isophane & regular) .... 50 units in the am and 10 units in the pm 6)  Metoprolol Tartrate 100 Mg Tabs (Metoprolol tartrate) .Marland Kitchen.. 1 tablet by mouth two times a day for blood pressure 7)  Fish Oil Concentrate 1000 Mg Caps (Omega-3 fatty acids) .Marland Kitchen.. 1 tablet daily 8)  Adult Aspirin Ec Low Strength 81 Mg Tbec (Aspirin) .Marland Kitchen.. 1 tablet by mouth daily 9)  Nitrostat 0.4 Mg Subl (Nitroglycerin) .Marland Kitchen.. 1 tab by mouth sl as needed chest pain 10)  Multivitamins Tabs (Multiple vitamin) .... Take 1 once daily 11)  Hydrochlorothiazide 25 Mg Tabs (Hydrochlorothiazide) .Marland Kitchen.. 1 tablet by mouth daily for blood pressure 12)  Oxycontin 10 Mg Xr12h-tab (Oxycodone hcl) .Marland Kitchen.. 1 tablet by mouth three times a day- prescribed by pain doctor 13)  Simvastatin 10 Mg Tabs (Simvastatin) .Marland Kitchen.. 1 at bedtime 14)  Lasix 20 Mg Tabs (Furosemide) .... Take 1/2 by mouth daily 15)  Cozaar 25 Mg Tabs (Losartan potassium) .... Take on by mouth daily  Patient Instructions: 1)  It was nice to meet you today! 2)  I have added two medications, Lasix and Cozaar. 3)  Please come back in 1 week for a blood pressure chack and to check your kidney function. This will be a nurse visit. Prescriptions: COZAAR 25 MG TABS (LOSARTAN POTASSIUM) take on by mouth daily  #30 x 0   Entered and Authorized by:   Briscoe Deutscher MD   Signed by:   Briscoe Deutscher MD on 01/26/2009   Method used:   Print then Give to Patient   RxID:   [id number] SIMVASTATIN 10 MG TABS (SIMVASTATIN) 1 at bedtime  #30 x 5   Entered and Authorized by:   Briscoe Deutscher MD   Signed by:   Briscoe Deutscher MD on 01/26/2009   Method used:   Print then Give to Patient   RxID:   [id number] HYDROCHLOROTHIAZIDE 25 MG TABS (HYDROCHLOROTHIAZIDE) 1 tablet by mouth daily for blood pressure  #90 x 3  Entered and Authorized by:    Briscoe Deutscher MD   Signed by:   Briscoe Deutscher MD on 01/26/2009   Method used:   Print then Give to Patient   RxID:   [id number] METOPROLOL TARTRATE 100 MG TABS (METOPROLOL TARTRATE) 1 tablet by mouth two times a day for blood pressure  #60 x 6   Entered and Authorized by:   Briscoe Deutscher MD   Signed by:   Briscoe Deutscher MD on 01/26/2009   Method used:   Print then Give to Patient   RxID:   [id number] PREVACID 30 MG CPDR (LANSOPRAZOLE) Take 1 capsule by mouth once a day for stomach  #90 x 2   Entered and Authorized by:   Briscoe Deutscher MD   Signed by:   Briscoe Deutscher MD on 01/26/2009   Method used:   Print then Give to Patient   RxID:   [id number] PAROXETINE HCL 20 MG  TABS (PAROXETINE HCL) one and one-half tabs by mouth daily  #90 x 3   Entered and Authorized by:   Briscoe Deutscher MD   Signed by:   Briscoe Deutscher MD on 01/26/2009   Method used:   Print then Give to Patient   RxID:   [id number] METFORMIN HCL 1000 MG TABS (METFORMIN HCL) 1 tablet twice a day for diabetes  #60 x 3   Entered and Authorized by:   Briscoe Deutscher MD   Signed by:   Briscoe Deutscher MD on 01/26/2009   Method used:   Print then Give to Patient   RxID:   [id number] ISOSORBIDE MONONITRATE CR 60 MG TB24 (ISOSORBIDE MONONITRATE) Take 1 tablet by mouth once a day for blood pressure  #30 x 5   Entered and Authorized by:   Briscoe Deutscher MD   Signed by:   Briscoe Deutscher MD on 01/26/2009   Method used:   Print then Give to Patient   RxID:   [id number] LASIX 20 MG TABS (FUROSEMIDE) take 1/2 by mouth daily  #30 x 0   Entered and Authorized by:   Briscoe Deutscher MD   Signed by:   Briscoe Deutscher MD on 01/26/2009   Method used:   Print then Give to Patient   RxID:   [id number] NOVOLIN 70/30 70-30 % SUSP (INSULIN ISOPHANE & REGULAR) 50 units in the am and 10 units in the pm  #3 x 3   Entered and Authorized by:   Briscoe Deutscher MD   Signed by:   Briscoe Deutscher MD on 01/26/2009   Method used:    Print then Give to Patient   RxID:   [id number]   Prevention & Chronic Care Immunizations   Influenza vaccine: Fluvax Non-MCR  (02/25/2008)   Influenza vaccine due: 03/08/2008    Tetanus booster: 11/24/2001: Done.   Tetanus booster due: 11/25/2011    Pneumococcal vaccine: Done.  (03/27/2004)   Pneumococcal vaccine due: None  Colorectal Screening   Hemoccult: Done.  (07/26/2002)   Hemoccult due: Not Indicated    Colonoscopy: Done.  (10/26/2002)   Colonoscopy due: 10/25/2012  Other Screening   PSA: 0.52  (03/09/2007)   PSA due due: 03/08/2008   Smoking status: quit  (01/26/2009)  Diabetes Mellitus   HgbA1C: 8.1  (11/07/2008)   Hemoglobin A1C due: 12/23/2007    Eye exam: Not documented    Foot exam: yes  (11/07/2008)   High risk foot: Not documented   Foot care education: Not documented   Foot exam due: 02/23/2009  Urine microalbumin/creatinine ratio: Not documented    Diabetes flowsheet reviewed?: Yes   Progress toward A1C goal: Deteriorated  Lipids   Total Cholesterol: 110  (10/20/2008)   LDL: 59  (10/20/2008)   LDL Direct: Not documented   HDL: 40  (10/20/2008)   Triglycerides: 53  (10/20/2008)    SGOT (AST): 18  (10/20/2008)   SGPT (ALT): 20  (10/20/2008)   Alkaline phosphatase: 103  (10/20/2008)   Total bilirubin: 0.8  (10/20/2008)    Lipid flowsheet reviewed?: Yes   Progress toward LDL goal: At goal  Hypertension   Last Blood Pressure: 158 / 80  (01/26/2009)   Serum creatinine: 0.95  (10/20/2008)   Serum potassium 3.9  (10/20/2008)    Hypertension flowsheet reviewed?: Yes   Progress toward BP goal: Unchanged  Self-Management Support :   Personal Goals (by the next clinic visit) :     Personal A1C goal: 8  (01/10/2009)     Personal blood pressure goal: 130/80  (01/10/2009)     Personal LDL goal: 100  (01/10/2009)    Patient will work on the following items until the next clinic visit to reach self-care goals:     Medications and  monitoring: take my medicines every day, check my blood sugar, check my blood pressure, bring all of my medications to every visit  (01/26/2009)     Eating: drink diet soda or water instead of juice or soda, eat more vegetables, use fresh or frozen vegetables, eat foods that are low in salt, eat baked foods instead of fried foods, eat fruit for snacks and desserts  (01/26/2009)     Activity: take a 30 minute walk every day  (01/26/2009)    Diabetes self-management support: Written self-care plan  (01/26/2009)   Diabetes care plan printed    Hypertension self-management support: Written self-care plan  (01/26/2009)   Hypertension self-care plan printed.    Lipid self-management support: Written self-care plan  (01/26/2009)   Lipid self-care plan printed.

## 2010-06-26 NOTE — Assessment & Plan Note (Signed)
Summary: r knee pain   Vital Signs:  Patient profile:   60 year old male Weight:      222.5 pounds Pulse rate:   56 / minute BP sitting:   140 / 80  (left arm)  Vitals Entered By: [place], (September 16, 2008 10:46 AM) CC: right knee pain and swelling x 3 weeks. Is Patient Diabetic? Yes  Pain Assessment Patient in pain? yes     Location: right knee Intensity: 6 Onset of pain  x 3 weeks   History of Present Illness: 60yo who recently lost his health insurance b/c his work cut his hours back.  Needs to change some expensive medications to generic if possible.  1.  R knee pain- for 3 weeks now.  Swollen at times.  No known injury or fall, but his knee feels like it pops with certain movements.  Has never had knee problems before- no prior surgeries.  Is not taking any pain medications currently.  Knee does not feel like it is going to give out.  2.  HLD- previously taking lipitor but cardiologist changed it to simvastatin for cost reason.  c/o simvastatincosts $45 at [employer].  no muscle aches or cramps.  due for lipid profile.  3.  HTN- cannot afford norvasc or hyzaar anymore.  Has some tablets left but needs generic affordable medications now.  Habits & Providers     Tobacco Status: quit  Current Medications (verified): 1)  Isosorbide Mononitrate Cr 60 Mg Tb24 (Isosorbide Mononitrate) .... Take 1 Tablet By Mouth Once A Day For Blood Pressure 2)  Simvastatin 20 Mg Tabs (Simvastatin) .Marland Kitchen.. 1 Tab At Bedtime 3)  Metformin Hcl 1000 Mg Tabs (Metformin Hcl) .Marland Kitchen.. 1 Tablet Twice A Day For Diabetes 4)  Norvasc 10 Mg Tabs (Amlodipine Besylate) .... Take 1/2  Tablet By Mouth Once A Day For Blood Pressure 5)  Paxil 30 Mg Tabs (Paroxetine Hcl) .... Take 1 Tablet By Mouth Once A Day For Depression 6)  Prevacid 30 Mg Cpdr (Lansoprazole) .... Take 1 Capsule By Mouth Once A Day For Stomach 7)  Relion 70/30 70-30 % Susp (Insulin Isophane & Regular) .... Inject 50 Unit Subcutaneously in Am and  10 in Pm.  Disp Qs For 3 Months 8)  Metoprolol Tartrate 100 Mg Tabs (Metoprolol Tartrate) .Marland Kitchen.. 1 Tablet By Mouth Two Times A Day For Blood Pressure 9)  Fish Oil Concentrate 1000 Mg  Caps (Omega-3 Fatty Acids) .Marland Kitchen.. 1 Tablet Daily 10)  Adult Aspirin Ec Low Strength 81 Mg Tbec (Aspirin) .Marland Kitchen.. 1 Tablet By Mouth Daily 11)  Nitrostat 0.4 Mg Subl (Nitroglycerin) .Marland Kitchen.. 1 Tab By Mouth Sl As Needed Chest Pain 12)  Multivitamins   Tabs (Multiple Vitamin) .... Take 1 Once Daily 13)  Hydrochlorothiazide 25 Mg Tabs (Hydrochlorothiazide) .Marland Kitchen.. 1 Tablet By Mouth Daily For Blood Pressure 14)  Oxycontin 10 Mg Xr12h-Tab (Oxycodone Hcl) .Marland Kitchen.. 1 Tablet By Mouth Three Times A Day- Prescribed By Pain Doctor  Allergies: 1)  Enalapril Maleate (Enalapril Maleate)  Past History:  Past Medical History:    Nonobstructive coronary disease- Cardiologist Dr. Verl Blalock ([place])       Nuclear study low risk - 09/25/2005 EF 59%       Stress cardiolyte: neg for ischemia - 04/17/2004,        Cath/PTCA: EF 65%; drug eluting stent of mid-circumflex artery - 02/24/2002,       Sestamibi/treadmill cardiolyte: neg; EF 56% - 06/28/1999       Nuclear stress  test negative for inschemia- 8/09    HTN      hyperlipidemia    Carotid doppler: normal - 06/27/1998     Type II DM with complications: Diabetic macular edema, nephropathy (proteinuria), retinopathy    h/o EtOH withdrawal/seizures/pancreatitis    h/o gastric ulcer           Colonoscopy: colon polyps (benign) - 10/26/2002       Upper GI neg 2000       Hpylori neg 2000    presbyopia, hyperopia    h/o chronic low back pain- prescribed oxycontin three times a day by pain specialist (worker's comp case)  Social History:    Works at [employer], but hours recently cut to part-time and 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    -No alcohol currently (h/o heavy use--sober since [id number]); -Denies illicit drug use; -Married to [employer]; -Has 2 biological children & 2 step-children.  Quiting smoking  currently.        Smoking Status:  quit  Physical Exam  General:  alert, well-developed, well-nourished, and well-hydrated.   Head:  normocephalic and atraumatic.   Lungs:  Normal respiratory effort, chest expands symmetrically. Lungs are clear to auscultation, no crackles or wheezes. Heart:  Normal rate and regular rhythm. S1 and S2 normal without gallop, murmur, click, rub or other extra sounds. Msk:  R knee- minimal swelling compared to left knee (but no obvious effusion).  Mildly warm, no erythema. No joint line tenderness or tenderness anywhere else. ACL, LCL, MCL intact. + crepitus with flexion/extension.  Pain with flexion/extension.  Neurologic:  alert & oriented X3.   Psych:  flat affect, minimal eye contact.   Impression & Recommendations:  Problem # 1:  KNEE PAIN, RIGHT (ICD-719.46)  no joint line tenderness to suggest OA.  Given popping of knee with certain movements, I wonder about possibility of degenerative meniscal tear. Pt currently without insurance and in process of applying for financial assistance through [employer].  Once financial assistance approved, consider x-ray vs. MRI of right knee. For now, start aleve two times a day (in addition to his chronic narcotic pain medication) His updated medication list for this problem includes:    Adult Aspirin Ec Low Strength 81 Mg Tbec (Aspirin) .Marland Kitchen... 1 tablet by mouth daily    Oxycontin 10 Mg Xr12h-tab (Oxycodone hcl) .Marland Kitchen... 1 tablet by mouth three times a day- prescribed by pain doctor  Orders: [place]- Est  Level 4 [id number])  Problem # 2:  HYPERTENSION, BENIGN SYSTEMIC (ICD-401.1)  d/c hyzaar due to cost.  replace with HCTZ '25mg'$  and increase metoprolol to '100mg'$  two times a day.  Advised he can get norvasc at [facility] for $15 for 90 tablets. The following medications were removed from the medication list:    Hyzaar 100-25 Mg Tabs (Losartan potassium-hctz) .Marland Kitchen... Take 1 tablet by mouth once a day for blood pressure His  updated medication list for this problem includes:    Norvasc 10 Mg Tabs (Amlodipine besylate) .Marland Kitchen... Take 1/2  tablet by mouth once a day for blood pressure    Metoprolol Tartrate 100 Mg Tabs (Metoprolol tartrate) .Marland Kitchen... 1 tablet by mouth two times a day for blood pressure    Hydrochlorothiazide 25 Mg Tabs (Hydrochlorothiazide) .Marland Kitchen... 1 tablet by mouth daily for blood pressure  Orders: [place]- Est  Level 4 (99214)  Problem # 3:  HYPERCHOLESTEROLEMIA (ICD-272.0)  FLP at goal.  f/u FLP in 6 months after change from lipitor to simvastatin (due to cost). Advised  he can get simvastatin at [facility] for $15. His updated medication list for this problem includes:    Simvastatin 20 Mg Tabs (Simvastatin) .Marland Kitchen... 1 tab at bedtime  Orders: [facility]- Est  Level 4 [id number])  Complete Medication List: 1)  Isosorbide Mononitrate Cr 60 Mg Tb24 (Isosorbide mononitrate) .... Take 1 tablet by mouth once a day for blood pressure 2)  Simvastatin 20 Mg Tabs (Simvastatin) .Marland Kitchen.. 1 tab at bedtime 3)  Metformin Hcl 1000 Mg Tabs (Metformin hcl) .Marland Kitchen.. 1 tablet twice a day for diabetes 4)  Norvasc 10 Mg Tabs (Amlodipine besylate) .... Take 1/2  tablet by mouth once a day for blood pressure 5)  Paxil 30 Mg Tabs (Paroxetine hcl) .... Take 1 tablet by mouth once a day for depression 6)  Prevacid 30 Mg Cpdr (Lansoprazole) .... Take 1 capsule by mouth once a day for stomach 7)  Relion 70/30 70-30 % Susp (Insulin isophane & regular) .... Inject 50 unit subcutaneously in am and 10 in pm.  disp qs for 3 months 8)  Metoprolol Tartrate 100 Mg Tabs (Metoprolol tartrate) .Marland Kitchen.. 1 tablet by mouth two times a day for blood pressure 9)  Fish Oil Concentrate 1000 Mg Caps (Omega-3 fatty acids) .Marland Kitchen.. 1 tablet daily 10)  Adult Aspirin Ec Low Strength 81 Mg Tbec (Aspirin) .Marland Kitchen.. 1 tablet by mouth daily 11)  Nitrostat 0.4 Mg Subl (Nitroglycerin) .Marland Kitchen.. 1 tab by mouth sl as needed chest pain 12)  Multivitamins Tabs (Multiple vitamin) .... Take 1 once daily 13)   Hydrochlorothiazide 25 Mg Tabs (Hydrochlorothiazide) .Marland Kitchen.. 1 tablet by mouth daily for blood pressure 14)  Oxycontin 10 Mg Xr12h-tab (Oxycodone hcl) .Marland Kitchen.. 1 tablet by mouth three times a day- prescribed by pain doctor  Patient Instructions: 1)  Go talk to Bonna Gains in the hospital about financial assistance for your office visits and for your medications. 2)  start getting simvastatin and amlodipine at [facility]- both are $15 for 90 day supply 3)  stop toprol xl and change to metoprolol '100mg'$  two times a day ($4 at [facility]) 4)  stop hyzaar because of expense. 5)  start hydrochlorothiazide for your blood pressure to replace hyzaar- this is $4. 6)  For your knee, take aleve 1 tablet by mouth two times a day.  This will help with swelling.  If your pain and swelling persist, we can set up an MRI of your knee after you have financial assitance. Prescriptions: NORVASC 10 MG TABS (AMLODIPINE BESYLATE) Take 1/2  tablet by mouth once a day for blood pressure  #90 x 1   Entered and Authorized by:   [name] MD   Signed by:   [name] MD on 09/16/2008   Method used:   Print then Give to Patient   RxID:   [id number] HYDROCHLOROTHIAZIDE 25 MG TABS (HYDROCHLOROTHIAZIDE) 1 tablet by mouth daily for blood pressure  #30 x 6   Entered and Authorized by:   [name] MD   Signed by:   [name] MD on 09/16/2008   Method used:   Print then Give to Patient   RxID:   [id number] SIMVASTATIN 20 MG TABS (SIMVASTATIN) 1 tab at bedtime  #90 x 4   Entered and Authorized by:   [name] MD   Signed by:   [name] MD on 09/16/2008   Method used:   Print then Give to Patient   RxID:   [id number] METOPROLOL TARTRATE 100 MG TABS (METOPROLOL TARTRATE) 1 tablet by mouth two times  a day for blood pressure  #60 x 6   Entered and Authorized by:   [name] MD   Signed by:   [name] MD on 09/16/2008   Method used:   Print then Give to Patient   RxID:   [id number]

## 2010-06-26 NOTE — Progress Notes (Signed)
Summary: requesting rx  Phone Note Call from Patient Call back at Home Phone [phone]   Reason for Call: Talk to Nurse Summary of Call: pt is calling re: an rx he received in feb 2008, says its dolcolax, pharmacy doesn't have the info on file. pt needs this refilled, [facility] Initial call taken by: Samara Snide,  September 15, 2007 4:57 PM  Follow-up for Phone Call        lm to notify pt that he can obtain this OTC. Advised him to call if he has any questions. Follow-up by: Carolyne Littles,  September 16, 2007 9:41 AM

## 2010-06-26 NOTE — Letter (Signed)
Summary: Handout Printed  Printed Handout:  - [place]

## 2010-06-26 NOTE — Progress Notes (Signed)
Summary: Triage  Phone Note Call from Patient Call back at Home Phone [phone]   Reason for Call: Talk to Nurse Summary of Call: left toe swollen and very sore, pt is diabetic Initial call taken by: Samara Snide,  August 10, 2009 9:02 AM  Follow-up for Phone Call        lm Follow-up by: Elige Radon RN,  August 10, 2009 9:24 AM  Additional Follow-up for Phone Call Additional follow up Details #1::        returning call Additional Follow-up by: Samara Snide,  August 10, 2009 9:30 AM    Additional Follow-up for Phone Call Additional follow up Details #2::    this started over the weekend. denies injury. wants to come in now as he has to go to work. placed in work in schedule Follow-up by: Elige Radon RN,  August 10, 2009 9:39 AM

## 2010-06-26 NOTE — Assessment & Plan Note (Signed)
Summary: boil on jaw wp   Vital Signs:  Patient Profile:   60 Years Old Male Height:     74 inches Weight:      221.6 pounds Temp:     98.1 degrees F Pulse rate:   78 / minute BP sitting:   126 / 75  (left arm)  Vitals Entered By: Carolyne Littles (June 10, 2008 10:05 AM)             Is Patient Diabetic? Yes     PCP:  [name] MD  Chief Complaint:  boli on jaw.  History of Present Illness: 60 yo with htn and diabetes.  Started 2 weeks ago with boil on left jaw area.  Was initially very painful, but pain is improving how.  No fevers.  Has not been using antibiotic ointment or any other measures.  Not sure if it is draining anymore.      Current Allergies: ENALAPRIL MALEATE (ENALAPRIL MALEATE)    Risk Factors:     Counseled to quit/cut down tobacco use:  yes     Physical Exam  General:     NAD, alert. Skin:     left TMJ area with 1cm area of swelling, erythema, and central pore that is actively draining.  + induration, but no discrete fluctuance or pus palpated.    Impression & Recommendations:  Problem # 1:  FOLLICULITIS (0000000) Assessment: New I believe this probably started with an ingrown hair given its location.  No area to I&D today.  Tx with doxycycline, topical antiboitic ointment, and warm compresses.  RTC around Tuesday if not improving or sooner if getting bigger or develops fevers.  See pt instructions. Orders: [place]- Est Level  3 [id number])   Complete Medication List: 1)  Hyzaar 100-25 Mg Tabs (Losartan potassium-hctz) .... Take 1 tablet by mouth once a day for blood pressure 2)  Isosorbide Mononitrate Cr 60 Mg Tb24 (Isosorbide mononitrate) .... Take 1 tablet by mouth once a day for blood pressure 3)  Lipitor 10 Mg Tabs (Atorvastatin calcium) .... Take 1 tablet by mouth every night for cholesterol 4)  Metformin Hcl 1000 Mg Tabs (Metformin hcl) .Marland Kitchen.. 1 tablet twice a day for diabetes 5)  Norvasc 10 Mg Tabs (Amlodipine besylate) .... Take 1/2   tablet by mouth once a day for blood pressure 6)  Paxil 30 Mg Tabs (Paroxetine hcl) .... Take 1 tablet by mouth once a day for depression 7)  Prevacid 30 Mg Cpdr (Lansoprazole) .... Take 1 capsule by mouth once a day for stomach 8)  Relion 70/30 70-30 % Susp (Insulin isophane & regular) .... Inject 50 unit subcutaneously in am and 10 in pm.  disp qs for 3 months 9)  Toprol Xl 100 Mg Tb24 (Metoprolol succinate) .Marland Kitchen.. 1 by mouth daily for blood pressure 10)  Bl Vitamin E Natural 400 Unit Caps (Vitamin e) .Marland Kitchen.. 1 tablet a day 11)  Vitamin C  .... 1 tablet a day 12)  Fish Oil Concentrate 1000 Mg Caps (Omega-3 fatty acids) .Marland Kitchen.. 1 tablet daily 13)  Alba-lybe Tabs (B complex vitamins) .Marland Kitchen.. 1 tablet a day 14)  Aspirin 325 Mg Tabs (Aspirin) .Marland Kitchen.. 1 tablet a day 15)  Spironolactone 25 Mg Tabs (Spironolactone) .Marland Kitchen.. 1 tablet 1 time a day for blood pressure and to raise potassium 16)  Mobic 7.5 Mg Tabs (Meloxicam) .Marland Kitchen.. 1 tablet by mouth once daily as needed for pain. take with food. 17)  Januvia 100 Mg Tabs (Sitagliptin phosphate) .Marland KitchenMarland KitchenMarland Kitchen 1  tablet by mouth once daily for diabetes 18)  Doxycycline Monohydrate 100 Mg Tabs (Doxycycline monohydrate) .Marland Kitchen.. 1 tablet by mouth two times a day for 7 days   Patient Instructions: 1)  Take the prescribed antbiotic for 7 days (doxycycline twice daily). 2)  Use a warm washcloth twice daily to help it drain. 3)  Use antibiotic ointment twice daily. 4)  If the infection is not better by Tuesday, call to schedule an appointment.  Call sooner if you develop fevers.   Prescriptions: DOXYCYCLINE MONOHYDRATE 100 MG TABS (DOXYCYCLINE MONOHYDRATE) 1 tablet by mouth two times a day for 7 days  #14 x 0   Entered and Authorized by:   [name] MD   Signed by:   [name] MD on 06/10/2008   Method used:   [facility]        [facility]  [address] [phone]* (retail)       [id number]       [place],   [address]       Ph: [phone] or  [phone]       Fax: [phone]   RxID:   [id number]

## 2010-06-26 NOTE — Assessment & Plan Note (Signed)
Summary: fu diabetes   Vital Signs:  Patient Profile:   60 Years Old Male Height:     74 inches Weight:      212.6 pounds Temp:     98.7 degrees F oral Pulse rate:   57 / minute BP sitting:   137 / 87  (left arm)  Vitals Entered By: Anthony [name] (February 24, 2008 10:55 AM)             Is Patient Diabetic? Yes     Stress Report will be faxed to attn: Anthony [name]  February 24, 2008 4:10 PM   Serial Vital Signs/Assessments:  Time      Position  BP       Pulse  Resp  Temp     By                     114/66                         Anthony Boos MD   PCP:  Anthony Boos MD  Chief Complaint:  f/u DM2.  History of Present Illness: 1.  CAD- recently saw Anthony. Terrence [name] and had stress test which he reports was normal.  No more chest pain recently.  Anthony. Terrence [name] stopped his clonidine b/c his SBP was 100's.   2.  DM2- not checking CBGs regularly.  Only checks CBGs when he is feeling bad.  Taking metformin and insulin as prescribed.  No hypoglycemic episodes.  3.  wants flu shot today.    Current Allergies: ENALAPRIL MALEATE (ENALAPRIL MALEATE)  Past Medical History:    Nonobstructive coronary disease- Cardiologist Anthony. Verl [name] ([place])       Nuclear study low risk - 09/25/2005 EF 59%       Stress cardiolyte: neg for ischemia - 04/17/2004,        Cath/PTCA: EF 65%; stenting of mid-circumflex artery - 02/24/2002,       Sestamibi/treadmill cardiolyte: neg; EF 56% - 06/28/1999    HTN      hx of hypokalemia    hyperlipidemia    Carotid doppler: normal - 06/27/1998     Type II DM with complications: Diabetic macular edema, nephropathy (proteinuria), retinopathy    h/o EtOH withdrawal/seizures/pancreatitis    h/o gastric ulcer           Colonoscopy: colon polyps (benign) - 10/26/2002       Upper GI neg 2000       Hpylori neg 2000    presbyopia, hyperopia      Physical Exam  General:     NAD Lungs:     Normal respiratory effort, chest expands symmetrically. Lungs are clear to  auscultation, no crackles or wheezes. Heart:     Normal rate and regular rhythm. S1 and S2 normal without gallop, murmur, click, rub or other extra sounds. Extremities:     no edema  Diabetes Management Exam:    Foot Exam (with socks and/or shoes not present):       Sensory-Monofilament:          Left foot: normal          Right foot: normal       Inspection:          Left foot: normal          Right foot: normal    Impression & Recommendations:  Problem # 1:  DIABETES MELLITUS, II, COMPLICATIONS (A999333) Assessment: Unchanged  pt not checking CBGs, so difficult for me to adjust insulin if needed.  Re-emphasized need for regularly checking cbg's b/c they can be high even when he is not feeling bad.  Pt asked to bring meter to next visit so I can adjust his insulin dose if necessary.  F/U in 2 months for A1C. His updated medication list for this problem includes:    Hyzaar 100-25 Mg Tabs (Losartan potassium-hctz) .Marland Kitchen... Take 1 tablet by mouth once a day    Metformin Hcl 1000 Mg Tabs (Metformin hcl) .Marland Kitchen... 1 tablet twice a day for diabetes    Relion 70/30 70-30 % Susp (Insulin isophane & regular) ..... Inject 50 unit subcutaneously in am and 10 in pm.  disp qs    Aspirin 325 Mg Tabs (Aspirin) .Marland Kitchen... 1 tablet a day   Problem # 2:  HYPERTENSION, BENIGN SYSTEMIC (ICD-401.1) Assessment: Improved Well-controlled.  Clonidine stopped due to SBP 100s at Anthony [name] office. The following medications were removed from the medication list:    Clonidine Hcl 0.1 Mg Tabs (Clonidine hcl) .Marland Kitchen... 1 tablet twice a day for blood pressure  His updated medication list for this problem includes:    Hyzaar 100-25 Mg Tabs (Losartan potassium-hctz) .Marland Kitchen... Take 1 tablet by mouth once a day    Norvasc 10 Mg Tabs (Amlodipine besylate) .Marland Kitchen... Take 1/2  tablet by mouth once a day    Toprol Xl 100 Mg Tb24 (Metoprolol succinate) .Marland Kitchen... 1 by mouth daily, not decrease in dose.    Spironolactone 25 Mg Tabs  (Spironolactone) .Marland Kitchen... 1 tablet 1 time a day for blood pressure and to raise potassium   Complete Medication List: 1)  Hyzaar 100-25 Mg Tabs (Losartan potassium-hctz) .... Take 1 tablet by mouth once a day 2)  Isosorbide Mononitrate Cr 60 Mg Tb24 (Isosorbide mononitrate) .... Take 1 tablet by mouth once a day 3)  Lipitor 10 Mg Tabs (Atorvastatin calcium) .... Take 1 tablet by mouth every night 4)  Metformin Hcl 1000 Mg Tabs (Metformin hcl) .Marland Kitchen.. 1 tablet twice a day for diabetes 5)  Norvasc 10 Mg Tabs (Amlodipine besylate) .... Take 1/2  tablet by mouth once a day 6)  Paxil 30 Mg Tabs (Paroxetine hcl) .... Take 1 tablet by mouth once a day 7)  Prevacid 30 Mg Cpdr (Lansoprazole) .... Take 1 capsule by mouth once a day 8)  Relion 70/30 70-30 % Susp (Insulin isophane & regular) .... Inject 50 unit subcutaneously in am and 10 in pm.  disp qs 9)  Toprol Xl 100 Mg Tb24 (Metoprolol succinate) .Marland Kitchen.. 1 by mouth daily, not decrease in dose. 10)  Bl Vitamin E Natural 400 Unit Caps (Vitamin e) .Marland Kitchen.. 1 tablet a day 11)  Vitamin C  .... 1 tablet a day 12)  Fish Oil Concentrate 1000 Mg Caps (Omega-3 fatty acids) .Marland Kitchen.. 1 tablet daily 13)  Alba-lybe Tabs (B complex vitamins) .Marland Kitchen.. 1 tablet a day 14)  Aspirin 325 Mg Tabs (Aspirin) .Marland Kitchen.. 1 tablet a day 15)  Spironolactone 25 Mg Tabs (Spironolactone) .Marland Kitchen.. 1 tablet 1 time a day for blood pressure and to raise potassium 16)  Voltaren 1 % Gel (Diclofenac sodium) .... Two times a day  to qid as needed to elbow for pain. 17)  Celebrex 400 Mg Caps (Celecoxib) .Marland Kitchen.. 1 tablet for pain a day 18)  Eq Nicotine 14 Mg/24hr Pt24 (Nicotine) .... Use 1 per day x 2 weeks, then go to 7 mg patch   Patient Instructions: 1)  Continue your  insulin as you are taking it, but bring your meter to the next visit so we can adjust your insulin if needed. 2)  Your bood pressure is perfect today! 3)  Follow up with Anthony. Ronnald [name] in 3 months.   ]  Influenza Vaccine (to be given today)   Flu  Vaccine Consent Questions    Do you have a history of severe allergic reactions to this vaccine? no    Any prior history of allergic reactions to egg and/or gelatin? no    Do you have a sensitivity to the preservative Thimersol? no    Do you have a past history of Guillan-Barre Syndrome? no    Do you currently have an acute febrile illness? no    Have you ever had a severe reaction to latex? no    Vaccine information given and explained to patient? yes   Last Hemoccult Result: Done. (07/26/2002 12:00:00 AM) Hemoccult Next Due:  Not Indicated  Appended Document: fu    Clinical Lists Changes  Problems: Removed problem of FATIGUE (ICD-780.79) Added new problem of DIABETIC MACULAR EDEMA (ICD-362.07) Added new problem of DIABETIC  RETINOPATHY (ICD-250.50) Orders: Added new Test order of [facility]- Est  Level 4 [id number]) - Signed  Appended Document: Immunization Entry       Influenza Vaccine    Vaccine Type: Fluvax Non-MCR    Site: right deltoid    Mfr: [employer]oSmith[employer]line    Dose: 0.5 ml    Route: IM    Given by: ADINA GOULD    Exp. Date: 11/23/2008    Lot #: [id number]    VIS given: 12/18/06 version given February 25, 2008.  Flu Vaccine Consent Questions    Do you have a history of severe allergic reactions to this vaccine? no    Any prior history of allergic reactions to egg and/or gelatin? no    Do you have a sensitivity to the preservative Thimersol? no    Do you have a past history of Guillan-Barre Syndrome? no    Do you currently have an acute febrile illness? no    Have you ever had a severe reaction to latex? no    Vaccine information given and explained to patient? yes  GIven 02-24-08.Marland KitchenADINA GOULD  February 25, 2008 8:30 AM

## 2010-06-26 NOTE — Progress Notes (Signed)
Summary: Change Medication  Phone Note Call from Patient Call back at Home Phone [phone]   Reason for Call: Talk to Doctor Summary of Call: pts sts he was taking hyzaar & needs to change to another cheaper medication, he thinks the name is "avalide" [facility] Initial call taken by: ERIN LEVAN,  Oct 07, 2006 3:39 PM  Follow-up for Phone Call        have eRx'd avalide pre pts request. slightly different dose than hyzaar, so pt should check his BP regularly when he first starts new med. then F/u BP in 3-4 wks sooner if not wel controlled. THanks Follow-up by: Lady Deutscher MD,  Oct 21, 2006 2:05 PM  Additional Follow-up for Phone Call Additional follow up Details #1::        pt informed Additional Follow-up by: [facility] CMA,  Oct 22, 2006 8:52 AM

## 2010-06-26 NOTE — Progress Notes (Signed)
Summary: Rx Prob  Phone Note Call from Patient Call back at [facility] Phone [phone]   Caller: Patient Summary of Call: The rx that Dr. Juleen China was going to call in for him the ins company wants to put him on something else.  What can be done. The medication was for his bp. Initial call taken by: Raymond Gurney,  April 04, 2009 9:16 AM  Follow-up for Phone Call        he does not want generics or to try any other drug for his bp. does not know the name of the drug. told him most insurance companies require trial of generics or preferred drugs before they approve name brand/high cost drugs. told him his md will be here today & I will forward this to her. I will call him with her response. Follow-up by: Elige Radon RN,  April 04, 2009 10:03 AM  Additional Follow-up for Phone Call Additional follow up Details #1::        is there a way that we can find out what his insurance will cover? he MUST have an ARB because he had a cough with an ACE. Losartan 25 mg by mouth daily is the generic for Cozaar and would work just as well.  Additional Follow-up by: Briscoe Deutscher DO,  April 04, 2009 1:47 PM    Additional Follow-up for Phone Call Additional follow up Details #2::    he has insurance with [employer] thru [employer]. policy # [id number]. their number is [phone]. then press 2.  the med is Cozaar.  I spoke with the rep. this is an employer supported basic coverage. It does not have a formulary or network. it will pay the first $1000 per year of claims only. Follow-up by: Elige Radon RN,  April 04, 2009 3:32 PM  Additional Follow-up for Phone Call Additional follow up Details #3:: Details for Additional Follow-up Action Taken: please let the patient know that i changed his prescription to the generic version of cozaar. it is called losartan and is exactly the same as the brand name. it looks like the cost of the generic medication for a 100 day supply is around $215. this sounds like a  lot of money, but i only want him to take 1/2 half of a tab daily (for now), so this will make the medication last longer.   if he picks up 100 pills of the losartan 50 mg tabs and cuts them in 1/2 each day, one bottle of pills should last for 200 doses. this equals just over 7 months of medication ( = about 30 dollars a month).   my only concern is that he has never tried this medication, so i don't know if he will tolerate it. i don't foresee any problems, but i would hate to have him spend so much on a medication that he cannot take. is there any way to help him with a one month or even 15 day prescription (since he will cut them in half) to see if he tolerates it before having him buy a 100 day prescription himself? would it be inappropriate to use the indegent fund? this patient works, but only part time. so, he does not qualify for MAP or [place] but he has a very difficult time affording his medical care and medications.   thanks for all of your help with this issue.  Additional Follow-up by: Briscoe Deutscher DO,  April 05, 2009 12:30 PM  New/Updated  Medications: LOSARTAN POTASSIUM 50 MG TABS (LOSARTAN POTASSIUM) one by mouth q day Prescriptions: LOSARTAN POTASSIUM 50 MG TABS (LOSARTAN POTASSIUM) one by mouth q day  #90 x 0   Entered and Authorized by:   Briscoe Deutscher DO   Signed by:   Briscoe Deutscher DO on 04/05/2009   Method used:   [facility]        [facility]  [address] [phone]* (retail)       [address]       [place], [place]  [address]       Ph: [phone] or [phone]       Fax: [phone]   [id number]  left message that he had a new rx at his pharmacy & to call back if he had questions.Elige Radon RN  April 06, 2009 9:14 AM

## 2010-06-26 NOTE — Progress Notes (Signed)
Summary: simvastatin   Medications Added SIMVASTATIN 20 MG TABS (SIMVASTATIN) 1 tab at bedtime       Phone Note Call from Patient Call back at Home Phone [phone]   Caller: Patient Summary of Call: [facility]ed [facility] to [facility] regarding medication refill. Initial call taken by: Alexander Bergeron,  September 13, 2008 12:55 PM  Follow-up for Phone Call        c/b pt verified what med needed to be refilled. Sent rx for simvastatin 20mg  90 dys x4  to [facility] [address] Julaine Hua, CMA  September 13, 2008 1:51 PM  Follow-up by: Julaine Hua, CMA,  September 13, 2008 1:51 PM    New/Updated Medications: SIMVASTATIN 20 MG TABS (SIMVASTATIN) 1 tab at bedtime   Prescriptions: SIMVASTATIN 20 MG TABS (SIMVASTATIN) 1 tab at bedtime  #90 x 4   Entered by:   Julaine Hua, CMA   Authorized by:   Renella Cunas, MD, [facility]   Signed by:   Julaine Hua, CMA on 09/13/2008   Method used:   Electronically to        [facility] [phone]* (retail)       [address]       [place], [place]  [address]       Ph: [phone]       Fax: [phone]   RxID:   [id number]

## 2010-06-26 NOTE — Assessment & Plan Note (Signed)
Summary: episode of cp & sob last night.ok now & out of dm meds   Vital Signs:  Patient profile:   60 year old male Weight:      220.8 pounds Temp:     98.8 degrees F Pulse rate:   58 / minute BP supine:   140 / 72  History of Present Illness: 60 yo M with h/o CAD (stent placed 2003 in L circumflex) presents with episode of chest pain last night.  States has been feeling more tired past couple days (and has been out of insulin for 3 days but has not called the office) culminating in bad fatigue last night.  Around 8-8:30 had 3 minutes of mid-chest tightness that went toward the left shoulder and had some numbness down left arm into 3rd and 4th fingers.  He did not take nitro (states is out of this) and it went away on its own.  Felt like he couldn't get a deep breath.  Some sweating.  No cough.  Off and on edema in his legs for months - more regular past 3-4 days.  No orthopnea.  Has a cardiologist - Dr. Verl Blalock and had negative stress test 8/09.  Current Problems (verified): 1)  Folliculitis  (0000000) 2)  Shoulder Impingement Syndrome, Left  (ICD-726.2) 3)  Diabetic Retinopathy  (ICD-250.50) 4)  Diabetic Macular Edema  (ICD-362.07) 5)  Venous Insufficiency, Legs  (ICD-459.81) 6)  Tobacco Abuse  (ICD-305.1) 7)  Leg Edema, Bilateral  (ICD-782.3) 8)  Obesity, Nos  (ICD-278.00) 9)  Neuropathy, Diabetic  (ICD-250.60) 10)  Hypertension, Benign Systemic  (ICD-401.1) 11)  Hypercholesterolemia  (ICD-272.0) 12)  Gastroesophageal Reflux, No Esophagitis  (ICD-530.81) 13)  Erectile Dysfunction  (ICD-302.74) 14)  Diabetes Mellitus, II, Complications  (A999333) 15)  Depressive Disorder, Nos  (ICD-311) 16)  Coronary, Arteriosclerosis  (ICD-414.00)  Medications Prior to Update: 1)  Hyzaar 100-25 Mg Tabs (Losartan Potassium-Hctz) .... Take 1 Tablet By Mouth Once A Day For Blood Pressure 2)  Isosorbide Mononitrate Cr 60 Mg Tb24 (Isosorbide Mononitrate) .... Take 1 Tablet By Mouth Once A Day For  Blood Pressure 3)  Lipitor 10 Mg Tabs (Atorvastatin Calcium) .... Take 1 Tablet By Mouth Every Night For Cholesterol 4)  Metformin Hcl 1000 Mg Tabs (Metformin Hcl) .Marland Kitchen.. 1 Tablet Twice A Day For Diabetes 5)  Norvasc 10 Mg Tabs (Amlodipine Besylate) .... Take 1/2  Tablet By Mouth Once A Day For Blood Pressure 6)  Paxil 30 Mg Tabs (Paroxetine Hcl) .... Take 1 Tablet By Mouth Once A Day For Depression 7)  Prevacid 30 Mg Cpdr (Lansoprazole) .... Take 1 Capsule By Mouth Once A Day For Stomach 8)  Relion 70/30 70-30 % Susp (Insulin Isophane & Regular) .... Inject 50 Unit Subcutaneously in Am and 10 in Pm.  Disp Qs For 3 Months 9)  Toprol Xl 100 Mg Tb24 (Metoprolol Succinate) .Marland Kitchen.. 1 By Mouth Daily For Blood Pressure 10)  Bl Vitamin E Natural 400 Unit  Caps (Vitamin E) .Marland Kitchen.. 1 Tablet A Day 11)  Vitamin C .... 1 Tablet A Day 12)  Fish Oil Concentrate 1000 Mg  Caps (Omega-3 Fatty Acids) .Marland Kitchen.. 1 Tablet Daily 13)  Alba-Lybe   Tabs (B Complex Vitamins) .Marland Kitchen.. 1 Tablet A Day 14)  Aspirin 325 Mg Tabs (Aspirin) .Marland Kitchen.. 1 Tablet A Day 15)  Spironolactone 25 Mg Tabs (Spironolactone) .Marland Kitchen.. 1 Tablet 1 Time A Day For Blood Pressure and To Raise Potassium 16)  Mobic 7.5 Mg Tabs (Meloxicam) .Marland Kitchen.. 1 Tablet By  Mouth Once Daily As Needed For Pain. Take With Food. 17)  Januvia 100 Mg Tabs (Sitagliptin Phosphate) .Marland Kitchen.. 1 Tablet By Mouth Once Daily For Diabetes 18)  Doxycycline Monohydrate 100 Mg Tabs (Doxycycline Monohydrate) .Marland Kitchen.. 1 Tablet By Mouth Two Times A Day For 7 Days  Allergies (verified): 1)  Enalapril Maleate (Enalapril Maleate)  Past History  Past Medical History: Nonobstructive coronary disease- Cardiologist Dr. Verl Blalock ([place])    Nuclear study low risk - 09/25/2005 EF 59%    Stress cardiolyte: neg for ischemia - 04/17/2004,     Cath/PTCA: EF 65%; stenting of mid-circumflex artery - 02/24/2002,    Sestamibi/treadmill cardiolyte: neg; EF 56% - 06/28/1999    Nuclear stress test negative for inschemia- 8/09 HTN   hx of  hypokalemia hyperlipidemia Carotid doppler: normal - 06/27/1998  Type II DM with complications: Diabetic macular edema, nephropathy (proteinuria), retinopathy h/o EtOH withdrawal/seizures/pancreatitis h/o gastric ulcer        Colonoscopy: colon polyps (benign) - 10/26/2002    Upper GI neg 2000    Hpylori neg 2000 presbyopia, hyperopia  (04/15/2008)  Past Surgical History: Cath/PTCA: EF 65%; stenting of mid-circumflex artery - 02/24/2002 Epidural injection for back pain - 01/25/2001 (12/18/2007)  Physical Exam  General:  Gen: Well-developed, overweight, NAD, alert and cooperative Neck: No LAN, thyromegaly, masses, bruits CV: RRR, no MRG Lungs: Nl resp effort. CTAB no wheezes, rales, rhonchi Ext: warm, well perfused.  1+ RLE edema, trace LLE edema.  No cyanosis Skin: No rashes or lesions on visible skin Additional Exam:  EKG: sinus bradycardia, PVCs x 2.  No ST changes.  Isolated Q wave in Lead 3.  Poss LAH.  Otherwise normal.   Impression & Recommendations:  Problem # 1:  CHEST PAIN (ICD-786.50) Assessment New  Pain only lasted for 3 minutes but was in left side of chest associated with shortness of breath and diaphoresis concerning for angina.  Completely asymptomatic now.  Had normal stress test  ~7 months ago.  Appointment made for patient to see his one of Dr. Winnifred Friar colleagues tomorrow morning.  Given script for nitro SL and instructed on use, to continue with his aspirin.  EKG without evidence of ischemia.  Will fax today's note and EKG to cardiology.  Orders: [place]- Est  Level 4 [id number])  Problem # 2:  DIABETES MELLITUS, II, COMPLICATIONS (A999333) Assessment: Deteriorated A1c close to goal but CBG >400 now.  He is out of his medicines and not checking his blood sugars.  Refilled his metformin, januvia, and 70/30.  His updated medication list for this problem includes:    Hyzaar 100-25 Mg Tabs (Losartan potassium-hctz) .Marland Kitchen... Take 1 tablet by mouth once a day for blood  pressure    Metformin Hcl 1000 Mg Tabs (Metformin hcl) .Marland Kitchen... 1 tablet twice a day for diabetes    Relion 70/30 70-30 % Susp (Insulin isophane & regular) ..... Inject 50 unit subcutaneously in am and 10 in pm.  disp qs for 3 months    Aspirin 325 Mg Tabs (Aspirin) .Marland Kitchen... 1 tablet a day    Januvia 100 Mg Tabs (Sitagliptin phosphate) .Marland Kitchen... 1 tablet by mouth once daily for diabetes  Orders: Glucose Cap-[facility] [id number]) A1C-[facility] [id number]) [facility]- Est  Level 4 [id number])  Complete Medication List: 1)  Hyzaar 100-25 Mg Tabs (Losartan potassium-hctz) .... Take 1 tablet by mouth once a day for blood pressure 2)  Isosorbide Mononitrate Cr 60 Mg Tb24 (Isosorbide mononitrate) .... Take 1 tablet by mouth once a day for blood pressure  3)  Lipitor 10 Mg Tabs (Atorvastatin calcium) .... Take 1 tablet by mouth every night for cholesterol 4)  Metformin Hcl 1000 Mg Tabs (Metformin hcl) .Marland Kitchen.. 1 tablet twice a day for diabetes 5)  Norvasc 10 Mg Tabs (Amlodipine besylate) .... Take 1/2  tablet by mouth once a day for blood pressure 6)  Paxil 30 Mg Tabs (Paroxetine hcl) .... Take 1 tablet by mouth once a day for depression 7)  Prevacid 30 Mg Cpdr (Lansoprazole) .... Take 1 capsule by mouth once a day for stomach 8)  Relion 70/30 70-30 % Susp (Insulin isophane & regular) .... Inject 50 unit subcutaneously in am and 10 in pm.  disp qs for 3 months 9)  Toprol Xl 100 Mg Tb24 (Metoprolol succinate) .Marland Kitchen.. 1 by mouth daily for blood pressure 10)  Bl Vitamin E Natural 400 Unit Caps (Vitamin e) .Marland Kitchen.. 1 tablet a day 11)  Vitamin C  .... 1 tablet a day 12)  Fish Oil Concentrate 1000 Mg Caps (Omega-3 fatty acids) .Marland Kitchen.. 1 tablet daily 13)  Alba-lybe Tabs (B complex vitamins) .Marland Kitchen.. 1 tablet a day 14)  Aspirin 325 Mg Tabs (Aspirin) .Marland Kitchen.. 1 tablet a day 15)  Spironolactone 25 Mg Tabs (Spironolactone) .Marland Kitchen.. 1 tablet 1 time a day for blood pressure and to raise potassium 16)  Mobic 7.5 Mg Tabs (Meloxicam) .Marland Kitchen.. 1 tablet by mouth once daily as needed  for pain. take with food. 17)  Januvia 100 Mg Tabs (Sitagliptin phosphate) .Marland Kitchen.. 1 tablet by mouth once daily for diabetes 18)  Doxycycline Monohydrate 100 Mg Tabs (Doxycycline monohydrate) .Marland Kitchen.. 1 tablet by mouth two times a day for 7 days 19)  Nitrostat 0.4 Mg Subl (Nitroglycerin) .Marland Kitchen.. 1 tab by mouth sl as needed chest pain  Patient Instructions: 1)  I have sent your medicines to your pharmacy. 2)  If you develop severe chest pain, take your nitro - if it comes back or is persistent, call 911. 3)  See Dr. Verl Blalock for follow-up. 4)  Your ekg today looks ok and your stress test 6 months ago looked good. 5)  Make sure you take your medicine every day.  if you're about to run out, you have to call us. 6)  Check your sugars at least once a day and document them - bring to each appointment. 7)  Check your skin for any areas of breakdown (especially feet). 8)  A1c should be checked every 3 months. 9)  Follow up with Dr. Ronnald Ramp in 1 month. 10)  The medication list was reviewed and reconciled.  All changed / newly prescribed medications were explained.  A complete medication list was provided to the patient / caregiver. Prescriptions: NITROSTAT 0.4 MG SUBL (NITROGLYCERIN) 1 tab by mouth SL as needed chest pain  #30 x 1   Entered and Authorized by:   Karlton Lemon MD   Signed by:   Karlton Lemon MD on 08/04/2008   Method used:   [facility]        [facility] [address] [phone]* (retail)       [address]       [place], [place]  [address]       Ph: [phone] or [phone]       Fax: [phone]   RxID:   [id number] JANUVIA 100 MG TABS (SITAGLIPTIN PHOSPHATE) 1 tablet by mouth once daily for diabetes  #90 x 2   Entered and Authorized by:   Audelia Acton  Jalesa Thien MD   Signed by:   Karlton Lemon MD on 08/04/2008   Method used:   [facility]        [facility] [address] [phone]* (retail)       [address]       [place], [place]   [address]       Ph: [phone] or [phone]       Fax: [phone]   RxID:   [id number] RELION 70/30 70-30 % SUSP (INSULIN ISOPHANE & REGULAR) Inject 50 unit subcutaneously in AM and 10 in PM.  disp qs for 3 months  #1 x 3   Entered and Authorized by:   Karlton Lemon MD   Signed by:   Karlton Lemon MD on 08/04/2008   Method used:   [facility]        [facility] [address] [facility] [address] [phone]* (retail)       [address]       [place], [place]  [address]       Ph: [phone] or [phone]       Fax: [phone]   RxID:   [id number]  Appt made for [facility] in [place] for 08/05/08 at 9:30 am.  Notes and EKG faxed.  Janeth Rase LPN  March 11, [id number] PM  Laboratory Results   Blood Tests   Date/Time Received: August 04, 2008 2:51 PM  Date/Time Reported: August 04, 2008 3:04 PM   HGBA1C: 7.7%   (Normal Range: Non-Diabetic - 3-6%   Control Diabetic - 6-8%)  Comments: ...............test performed by......Marland KitchenBonnie A. [place], [place] (ASCP)

## 2010-06-26 NOTE — Progress Notes (Signed)
Summary: triage  Phone Note Call from Patient Call back at Home Phone [phone]   Caller: Patient Summary of Call: pt has been out of insulin for 5 days and needs to talk to nurse Initial call taken by: Audie Clear,  December 26, 2008 3:18 PM  Follow-up for Phone Call        has been out of insulin first due to no money & then when he went to the pharmacy they told him he needed a new rx. no documentation in chart of request for insulin. refilled now & told pt to go get & take it. he has not been checking his cbgs. states he is afraid what they will be. states he does not feel good Follow-up by: Elige Radon RN,  December 26, 2008 3:19 PM    Prescriptions: RELION 70/30 70-30 % SUSP (INSULIN ISOPHANE & REGULAR) Inject 50 unit subcutaneously in AM and 10 in PM.  disp qs for 3 months  #1 x 6   Entered by:   Elige Radon RN   Authorized by:   Briscoe Deutscher MD   Signed by:   Elige Radon RN on 12/26/2008   Method used:   Electronically to        [facility] [phone]* (retail)       [address]       [place], [place]  [address]       Ph: [phone]       Fax: [phone]   RxID:   [id number]

## 2010-06-26 NOTE — Consult Note (Signed)
Summary: [facility]  [facility]   Imported By: Audie Clear 05/22/2009 15:07:20  _____________________________________________________________________  External Attachment:    Type:   Image     Comment:   External Document

## 2010-06-26 NOTE — Assessment & Plan Note (Signed)
Summary: 1 YR F/U   Visit Type:  1 yr f/u Primary Provider:  Briscoe Deutscher DO  CC:  pt states BP has been running high...pt had recent kidney stone.....  History of Present Illness: Anthony [name] comes in today for evaluation and management of his coronary disease, hypertension, lower extremity edema, and PVCs.  He denies any symptoms of angina or ischemia. He's had noted increased dyspnea on exertion. He has has some lower extremity edema and wonders if any of his meds are causing this.he denies orthopnea or PND.  Recent blood work showed potassium 3.7. His lipids were checked in May of 2000 and were at goal other than his HDL.  Current Medications (verified): 1)  Isosorbide Mononitrate Cr 60 Mg Tb24 (Isosorbide Mononitrate) .... Take 1 Tablet By Mouth Once A Day For Blood Pressure 2)  Metformin Hcl 1000 Mg Tabs (Metformin Hcl) .Marland Kitchen.. 1 Tablet Twice A Day For Diabetes 3)  Paroxetine Hcl 20 Mg  Tabs (Paroxetine Hcl) .... One and One-Half Tabs By Mouth Daily 4)  Prevacid 30 Mg Cpdr (Lansoprazole) .... Take 1 Capsule By Mouth Once A Day For Stomach 5)  Novolin 70/30 70-30 % Susp (Insulin Isophane & Regular) .... 50 Units in The Am and 10 Units in The Pm 6)  Metoprolol Tartrate 100 Mg Tabs (Metoprolol Tartrate) .Marland Kitchen.. 1 Tablet By Mouth Two Times A Day For Blood Pressure 7)  Fish Oil Concentrate 1000 Mg  Caps (Omega-3 Fatty Acids) .Marland Kitchen.. 1 Tablet Daily 8)  Adult Aspirin Ec Low Strength 81 Mg Tbec (Aspirin) .Marland Kitchen.. 1 Tablet By Mouth Daily 9)  Nitrostat 0.4 Mg Subl (Nitroglycerin) .Marland Kitchen.. 1 Tab By Mouth Sl As Needed Chest Pain 10)  Multivitamins   Tabs (Multiple Vitamin) .... Take 1 Once Daily 11)  Oxycontin 10 Mg Xr12h-Tab (Oxycodone Hcl) .Marland Kitchen.. 1 Tablet By Mouth Three Times A Day- Prescribed By Pain Doctor 12)  Simvastatin 10 Mg Tabs (Simvastatin) .Marland Kitchen.. 1 At Bedtime 13)  Lasix 20 Mg Tabs (Furosemide) .... Take 2 By Mouth Daily 14)  Losartan Potassium 50 Mg Tabs (Losartan Potassium) .... One By Mouth Q  Day 15)  Potassium Chloride Cr 10 Meq  Tbcr (Potassium Chloride) .... 2 By Mouth Daily 16)  Hydrocodone-Acetaminophen 10-325 Mg Tabs (Hydrocodone-Acetaminophen) .Marland Kitchen.. 1 By Mouth Up To 4 Time Per Day As Needed For Pain  Allergies: 1)  Enalapril Maleate (Enalapril Maleate)  Past History:  Past Medical History: Last updated: 09/07/2009 Nonobstructive Coronary Disease - Cardiologist Dr. Verl Blalock [facility])    Myoview: EF 68%, no ischemia - 2009    Nuclear study low risk:  EF 59% - 09/25/2005    Stress cardiolyte: neg for ischemia - 04/17/2004    Cath/PTCA: EF 65%; drug eluting stent of mid-circumflex artery - 02/24/2002    Sestamibi/treadmill cardiolyte: neg; EF 56% - 06/28/1999    Nuclear stress test negative for inschemia- 8/09 HTN   HLD Carotid Doppler: normal - 06/27/1998  Type II DM with complications: Diabetic macular edema, nephropathy (proteinuria), retinopathy, neuropathy h/o EtOH withdrawal/seizures/pancreatitis h/o Gastric Ulcer        Colonoscopy: colon polyps (benign) - 10/26/2002    Upper GI neg 2000    Hpylori neg 2000 Presbyopia, Hyperopia h/o chronic low back pain - prescribed oxycontin three times a day by pain specialist (worker's comp case) Kidney Stones  Past Surgical History: Last updated: 12/18/2007 Cath/PTCA: EF 65%; stenting of mid-circumflex artery - 02/24/2002 Epidural injection for back pain - 01/25/2001  Family History: Last updated: 07/12/2009 6 siblings: DM,  HTN, gout, migraines,  Breast CA 1st degree,  Diabetes 1st degree,  Father: CHF, dementia,  Mother: DM, HTN, CVA, breast CA,        Social History: Last updated: 07/12/2009 Works at [employer], but hours recently cut to part-time and 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 No alcohol currently (h/o heavy use--sober since 1993); Denies illicit drug use; Married to [employer]; Has 2 biological children & 2 step-children. Smoker.  Risk Factors: Alcohol Use: 0 (01/26/2009)  Risk Factors: Smoking Status: quit  (09/07/2009) Packs/Day: .5 (07/21/2007)  Review of Systems       negative other than history of present illness  Vital Signs:  Patient profile:   60 year old male Height:      72 inches Weight:      211 pounds BMI:     28.72 Pulse rate:   73 / minute Pulse rhythm:   irregular BP sitting:   130 / 60  (left arm) Cuff size:   large  Vitals Entered By: Julaine Hua, CMA (September 11, 2009 3:02 PM)  Physical Exam  General:  obese.   Head:  normocephalic and atraumatic Eyes:  PERRLA/EOM intact; conjunctiva and lids normal. Neck:  Neck supple, no JVD. No masses, thyromegaly or abnormal cervical nodes. Chest [facility] [employer]:  no deformities or breast masses noted Lungs:  Clear bilaterally to auscultation and percussion. Heart:  PMI hard to appreciate, soft S1-S2, no gallop Abdomen:  Bowel sounds positive; abdomen soft and non-tender without masses, organomegaly, or hernias noted. No hepatosplenomegaly. Msk:  decreased ROM.   Pulses:  pulses normal in all 4 extremities Extremities:  1+ left pedal edema and 1+ right pedal edema.   Neurologic:  Alert and oriented x 3. Skin:  Intact without lesions or rashes. Psych:  Normal affect.   EKG  Procedure date:  09/11/2009  Findings:      normal sinus rhythm, frequent PVCs which are unifocal.  Impression & Recommendations:  Problem # 1:  CORONARY, ARTERIOSCLEROSIS (ICD-414.00) Assessment Unchanged  We are treating this medically. This appears to be stable at this time.  Orders: EKG w/ Interpretation (93000)  Problem # 2:  IRREGULAR HEART RATE (ICD-427.9) He has stable ventricular ectopy. Last potassium 3.7. Try to keep above 4 . We'll double potassium dose today. His updated medication list for this problem includes:    Isosorbide Mononitrate Cr 60 Mg Tb24 (Isosorbide mononitrate) .Marland Kitchen... Take 1 tablet by mouth once a day for blood pressure    Metoprolol Tartrate 100 Mg Tabs (Metoprolol tartrate) .Marland Kitchen... 1 tablet by mouth two times a day for  blood pressure    Adult Aspirin Ec Low Strength 81 Mg Tbec (Aspirin) .Marland Kitchen... 1 tablet by mouth daily    Nitrostat 0.4 Mg Subl (Nitroglycerin) .Marland Kitchen... 1 tab by mouth sl as needed chest pain  Problem # 3:  LEG EDEMA, BILATERAL (ICD-782.3) Assessment: Unchanged  Problem # 4:  DIABETES MELLITUS, II, COMPLICATIONS (A999333)  His updated medication list for this problem includes:    Metformin Hcl 1000 Mg Tabs (Metformin hcl) .Marland Kitchen... 1 tablet twice a day for diabetes    Novolin 70/30 70-30 % Susp (Insulin isophane & regular) .Marland KitchenMarland KitchenMarland KitchenMarland Kitchen 50 units in the am and 10 units in the pm    Adult Aspirin Ec Low Strength 81 Mg Tbec (Aspirin) .Marland Kitchen... 1 tablet by mouth daily    Losartan Potassium 50 Mg Tabs (Losartan potassium) ..... One by mouth q day  Problem # 5:  HYPERTENSION, BENIGN SYSTEMIC (ICD-401.1) Assessment: Improved  His updated medication list for this problem includes:    Metoprolol Tartrate 100 Mg Tabs (Metoprolol tartrate) .Marland Kitchen... 1 tablet by mouth two times a day for blood pressure    Adult Aspirin Ec Low Strength 81 Mg Tbec (Aspirin) .Marland Kitchen... 1 tablet by mouth daily    Lasix 20 Mg Tabs (Furosemide) .Marland Kitchen... Take 2 by mouth daily    Losartan Potassium 50 Mg Tabs (Losartan potassium) ..... One by mouth q day  Problem # 6:  OBESITY, NOS (ICD-278.00) Assessment: Unchanged  Problem # 7:  HYPERCHOLESTEROLEMIA (ICD-272.0) I have reviewed his numbers with him today. He has excellent values other than a low HDL. His updated medication list for this problem includes:    Simvastatin 10 Mg Tabs (Simvastatin) .Marland Kitchen... 1 at bedtime  Patient Instructions: 1)  Your physician recommends that you schedule a follow-up appointment in: 6 MONTHS 2)  Your physician has recommended you make the following change in your medication: TAKE 2 POTASSIUM TABLETS IN THE MORNING AND 1 TABLET IN THE EVENING

## 2010-06-26 NOTE — Miscellaneous (Signed)
Summary: prior auth  Clinical Lists Changes prior auth for furosemide faxed .Marland KitchenElige Radon RN  November 29, 2009 3:42 PM  Appended Document: prior Josem Kaufmann it was returned with a note that [facility] does not administer this plan. calld pt to find out who handles his  med insurance. he says it is [facility]. called pharmacy to see if they had further info. they stated this is a workers comp issue & the med is covered. they told me to throw it out. not needed

## 2010-06-26 NOTE — Assessment & Plan Note (Signed)
Summary: f/u visit/bmc   Vital Signs:  Patient Profile:   60 Years Old Male Height:     74 inches Weight:      217.2 pounds Temp:     98.1 degrees F oral Pulse rate:   69 / minute BP sitting:   139 / 80  (left arm)  Vitals Entered By: Carolyne Littles (April 15, 2008 9:51 AM)             Is Patient Diabetic? Yes     Serial Vital Signs/Assessments:  Time      Position  BP       Pulse  Resp  Temp     By                     110/60                         [name] MD   PCP:  [name] MD  Chief Complaint:  r shoulder pain.  History of Present Illness: 60 yo with h/o CAD s/p stents, HTN, HLD, and DM2.  Had negative myoview in 12/2007.  Had episode 2 weeks ago that started with lifting a garage door at work.  Had sudden pain in left upper chest, into shoulder, and then pain going down into arm.  Felt like he pulled a muscle.  Pain lasted about 5 minutes and resolved.  Since then he has had ache in left shoulder.  Feels deep within.  Has never had shoulder pain before or problems with rotator cuff.  Not taking any pain medications.  No weakness in shoulder, full range of motion.    2.  DM2- not checking blood sugars.  Taking metformin, januvia, insulin as prescribed.  Needs refills.    Current Allergies: ENALAPRIL MALEATE (ENALAPRIL MALEATE)  Past Medical History:    Nonobstructive coronary disease- Cardiologist Dr. Verl Blalock ([place])       Nuclear study low risk - 09/25/2005 EF 59%       Stress cardiolyte: neg for ischemia - 04/17/2004,        Cath/PTCA: EF 65%; stenting of mid-circumflex artery - 02/24/2002,       Sestamibi/treadmill cardiolyte: neg; EF 56% - 06/28/1999       Nuclear stress test negative for inschemia- 8/09    HTN      hx of hypokalemia    hyperlipidemia    Carotid doppler: normal - 06/27/1998     Type II DM with complications: Diabetic macular edema, nephropathy (proteinuria), retinopathy    h/o EtOH withdrawal/seizures/pancreatitis    h/o gastric ulcer          Colonoscopy: colon polyps (benign) - 10/26/2002       Upper GI neg 2000       Hpylori neg 2000    presbyopia, hyperopia    Risk Factors:     Counseled to quit/cut down tobacco use:  yes     Physical Exam  General:     NAD Chest Wall:     not tender to palpation Neurologic:     alert & oriented X3 and gait normal.   Skin:     Shoulder: Inspection reveals no abnormalities, atrophy or asymmetry. Palpation is normal with no tenderness over AC joint or bicipital groove. ROM is full in all planes. Rotator cuff strength normal throughout. + signs of impingement with Neer's and Hawkin's. No painful arc and no drop arm sign. No apprehension sign  Psych:     normally interactive and good eye contact.      Impression & Recommendations:  Problem # 1:  SHOULDER IMPINGEMENT SYNDROME, LEFT (ICD-726.2) Assessment: New Likely secondary to acute injury lifting garage door.  Rotator cuff muscle strength and ROM intact.  Offered NSAID therapy vs injection and pt opted for NSAID.  Start mobic 7.5mg  as needed for pain.  RTC if no better and consider injection. Orders: [place]- Est  Level 4 [id number])   Problem # 2:  HYPERTENSION, BENIGN SYSTEMIC (ICD-401.1) Assessment: Unchanged BP at goal.  Cont meds below. His updated medication list for this problem includes:    Hyzaar 100-25 Mg Tabs (Losartan potassium-hctz) .Marland Kitchen... Take 1 tablet by mouth once a day for blood pressure    Norvasc 10 Mg Tabs (Amlodipine besylate) .Marland Kitchen... Take 1/2  tablet by mouth once a day for blood pressure    Toprol Xl 100 Mg Tb24 (Metoprolol succinate) .Marland Kitchen... 1 by mouth daily for blood pressure    Spironolactone 25 Mg Tabs (Spironolactone) .Marland Kitchen... 1 tablet 1 time a day for blood pressure and to raise potassium  Orders: [place]- Est  Level 4 [id number])   Problem # 3:  DIABETES MELLITUS, II, COMPLICATIONS (A999333) Assessment: Unchanged Still not checking CBGs.  Again asked him to do this so we can adjust insulin if  necessary.  A1C 7.4% which he understands is too high. His updated medication list for this problem includes:    Hyzaar 100-25 Mg Tabs (Losartan potassium-hctz) .Marland Kitchen... Take 1 tablet by mouth once a day for blood pressure    Metformin Hcl 1000 Mg Tabs (Metformin hcl) .Marland Kitchen... 1 tablet twice a day for diabetes    Relion 70/30 70-30 % Susp (Insulin isophane & regular) ..... Inject 50 unit subcutaneously in am and 10 in pm.  disp qs for 3 months    Aspirin 325 Mg Tabs (Aspirin) .Marland Kitchen... 1 tablet a day    Januvia 100 Mg Tabs (Sitagliptin phosphate) .Marland Kitchen... 1 tablet by mouth once daily for diabetes  Orders: A1C-[facility] [id number]) [place]- Est  Level 4 [id number])   Complete Medication List: 1)  Hyzaar 100-25 Mg Tabs (Losartan potassium-hctz) .... Take 1 tablet by mouth once a day for blood pressure 2)  Isosorbide Mononitrate Cr 60 Mg Tb24 (Isosorbide mononitrate) .... Take 1 tablet by mouth once a day for blood pressure 3)  Lipitor 10 Mg Tabs (Atorvastatin calcium) .... Take 1 tablet by mouth every night for cholesterol 4)  Metformin Hcl 1000 Mg Tabs (Metformin hcl) .Marland Kitchen.. 1 tablet twice a day for diabetes 5)  Norvasc 10 Mg Tabs (Amlodipine besylate) .... Take 1/2  tablet by mouth once a day for blood pressure 6)  Paxil 30 Mg Tabs (Paroxetine hcl) .... Take 1 tablet by mouth once a day for depression 7)  Prevacid 30 Mg Cpdr (Lansoprazole) .... Take 1 capsule by mouth once a day for stomach 8)  Relion 70/30 70-30 % Susp (Insulin isophane & regular) .... Inject 50 unit subcutaneously in am and 10 in pm.  disp qs for 3 months 9)  Toprol Xl 100 Mg Tb24 (Metoprolol succinate) .Marland Kitchen.. 1 by mouth daily for blood pressure 10)  Bl Vitamin E Natural 400 Unit Caps (Vitamin e) .Marland Kitchen.. 1 tablet a day 11)  Vitamin C  .... 1 tablet a day 12)  Fish Oil Concentrate 1000 Mg Caps (Omega-3 fatty acids) .Marland Kitchen.. 1 tablet daily 13)  Alba-lybe Tabs (B complex vitamins) .Marland Kitchen.. 1 tablet a day 14)  Aspirin 325 Mg  Tabs (Aspirin) .Marland Kitchen.. 1 tablet a day 15)   Spironolactone 25 Mg Tabs (Spironolactone) .Marland Kitchen.. 1 tablet 1 time a day for blood pressure and to raise potassium 16)  Mobic 7.5 Mg Tabs (Meloxicam) .Marland Kitchen.. 1 tablet by mouth once daily as needed for pain. take with food. 17)  Januvia 100 Mg Tabs (Sitagliptin phosphate) .Marland Kitchen.. 1 tablet by mouth once daily for diabetes   Patient Instructions: 1)  The pain in your shoulder is likely from tendonitis of your rotator cuff muscle. 2)  Take the prescribed anti-inflammatory medication once daily with food as needed for pain. 3)  Keep your shoulder moving as much as possible and follow-up if it is not getting better as we can inject a steroid if possible. 4)  Follow-up with Dr. Ronnald Ramp in 3 months WITH A LOG BOOK OF YOUR SUGARS.  Please try to check your sugar once daily.   Prescriptions: JANUVIA 100 MG TABS (SITAGLIPTIN PHOSPHATE) 1 tablet by mouth once daily for diabetes  #90 x 2   Entered and Authorized by:   [name] MD   Signed by:   [name] MD on 04/15/2008   Method used:   Electronically to        [facility] ([place]  [address]       Ph: [phone]       Fax: [phone]   RxID:   [id number] SPIRONOLACTONE 25 MG TABS (SPIRONOLACTONE) 1 tablet 1 time a day for blood pressure and to raise potassium  #90 x 2   Entered and Authorized by:   [name] MD   Signed by:   [name] MD on 04/15/2008   Method used:   Electronically to        [facility] ([facility])       [place]  [address]       Ph: [phone]       Fax: [phone]   RxID:   [id number] TOPROL XL 100 MG TB24 (METOPROLOL SUCCINATE) 1 by mouth daily for blood pressure  #90 x 2   Entered and Authorized by:   [name] MD   Signed by:   [name] MD on 04/15/2008   Method used:   Electronically to        [facility] (mail[facility])       [place]  [address]       Ph: [phone]       Fax: [phone]   [id number] RELION 70/30 70-30 % SUSP (INSULIN ISOPHANE & REGULAR) Inject 50 unit subcutaneously in AM and 10 in PM.  disp qs for 3 months  #1 x 11   Entered and Authorized by:   [name] MD   Signed by:   [name] MD on 04/15/2008   Method used:   Electronically to        [facility] (mail[facility])       [place]  [address]       Ph: [phone]       Fax: [phone]   [id number] PREVACID 30 MG CPDR (LANSOPRAZOLE) Take 1 capsule by mouth once a day for stomach  #90 x 2   Entered and Authorized by:   [employer]  Ronnald Ramp MD   Signed by:   [name] MD on 04/15/2008   Method used:   Electronically to        [facility] ([facility]  [address]       Ph: [phone]       Fax: [phone]   RxID:   [id number] PAXIL 30 MG TABS (PAROXETINE HCL) Take 1 tablet by mouth once a day for depression  #90 x 2   Entered and Authorized by:   [name] MD   Signed by:   [name] MD on 04/15/2008   Method used:   Electronically to        [facility] ([place])       [place]  [address]       Ph: [phone]       Fax: [phone]   [id number] [place] 10 MG TABS (AMLODIPINE BESYLATE) Take 1/2  tablet by mouth once a day for blood pressure  #90 x 1   Entered and Authorized by:   [name] MD   Signed by:   [name] MD on 04/15/2008   Method used:   Electronically to        [facility] ([place]  [address]       Ph: [phone]       Fax: [phone]   [id number] METFORMIN HCL 1000 MG TABS (METFORMIN HCL) 1 tablet twice a day for diabetes  #90 x 2   Entered and Authorized by:   [name] MD   Signed by:   [name] MD on 04/15/2008   Method used:   Electronically to        [facility] (mail[facility])       [place]  [address]       Ph: [phone]       Fax: [phone]   [id number] LIPITOR 10 MG TABS (ATORVASTATIN CALCIUM) Take 1 tablet by mouth every night for cholesterol  #90 x 2   Entered and Authorized by:   [name] MD   Signed by:   [name] MD on 04/15/2008   Method used:   Electronically to        [facility] (mail[facility])       [place]  [address]       Ph: [phone]       Fax: [phone]   [id number] ISOSORBIDE MONONITRATE CR 60 MG TB24 (ISOSORBIDE MONONITRATE) Take 1 tablet by mouth once a day for blood pressure  #90 x 2   Entered and Authorized by:   [name] MD   Signed by:   [name] MD on 04/15/2008   Method used:   Electronically to        [facility] ([facility]  [address]       Ph: [phone]       Fax: [phone]   [id number] HYZAAR 100-25 MG TABS (LOSARTAN POTASSIUM-HCTZ) Take 1 tablet by mouth once a day  for blood pressure  #90 x 2   Entered and Authorized by:   [name] MD   Signed by:   [name] MD on 04/15/2008   Method used:   Electronically to        [facility] (mail[facility]  [address]       Ph: [phone]       Fax: [phone]   RxID:   [id number] MOBIC 7.5 MG TABS (MELOXICAM) 1 tablet by mouth once daily as needed for pain. Take with food.  #30 x 1   Entered and Authorized by:   [name] MD   Signed by:   [name] MD on 04/15/2008   Method used:   Electronically to        [facility] [phone]* (retail)       [address]       [place],   [address]       Ph: [phone] or [phone]       Fax: [phone]   RxID:   [id number]  ]  Laboratory Results   Blood Tests   Date/Time Received: April 15, 2008 10:14 AM  Date/Time Reported: April 15, 2008 10:24 AM   HGBA1C: 7.4%   (Normal Range: Non-Diabetic - 3-6%   Control Diabetic - 6-8%)  Comments: ...........test performed  by...........Marland KitchenHedy Camara, CMA

## 2010-06-26 NOTE — Miscellaneous (Signed)
Summary: Orders Update  Clinical Lists Changes  Problems: Added new problem of ENCOUNTER FOR LONG-TERM USE OF OTHER MEDICATIONS (ICD-V58.69) Orders: Added new Test order of B12-[facility] [phone]) - Signed Added new Test order of CBC-[facility] [id number]) - Signed Ok per Dr. Juleen China

## 2010-06-26 NOTE — Progress Notes (Signed)
Summary: triage  Phone Note Call from Patient Call back at Home Phone [phone]   Reason for Call: Talk to Nurse Summary of Call: pt sts there is a place on his hand, not sure if its an insect bite or MRSA, sts it started out itching really bad, pt sts he put antibiotic ointment on it and a bandaid and now it looks like it has puss in it, wants to know what he should do? Initial call taken by: ERIN LEVAN,  June 26, 2007 2:55 PM  Follow-up for Phone Call        has had this for a few days, getting worse. Has DM. sent to urgent care. pt agreed Follow-up by: Elige Radon RN,  June 26, 2007 3:04 PM

## 2010-06-26 NOTE — Assessment & Plan Note (Signed)
Summary: [facility]   Primary Provider:  [name] MD   History of Present Illness: Anthony [name] returns today for followup of his coronary artery disease. He has been laid off to part-time at his job and lost his insurance. He is really struggling and has had to stop some of his more specific medication such as Januvia.  He is still taking most of his antihypertensives as well as his diabetic meds. He's taking a lot of different vitamins other than his multivitamin which I told you cut back on. However this will save some money.  He's also taking Lipitor which I can switch to simvastatin 20 mg per day. He will need blood work at family practice in 6-8 weeks.  He denies any angina or ischemic equivalent symptoms. His last stress Myoview was 8 2009 showed an ejection fraction 68% with no ischemia.  He denies any syncope orthopnea, PND or peripheral edema.  Current Medications (verified): 1)  Hyzaar 100-25 Mg Tabs (Losartan Potassium-Hctz) .... Take 1 Tablet By Mouth Once A Day For Blood Pressure 2)  Isosorbide Mononitrate Cr 60 Mg Tb24 (Isosorbide Mononitrate) .... Take 1 Tablet By Mouth Once A Day For Blood Pressure 3)  Lipitor 10 Mg Tabs (Atorvastatin Calcium) .... Take 1 Tablet By Mouth Every Night For Cholesterol 4)  Metformin Hcl 1000 Mg Tabs (Metformin Hcl) .Marland Kitchen.. 1 Tablet Twice A Day For Diabetes 5)  Norvasc 10 Mg Tabs (Amlodipine Besylate) .... Take 1/2  Tablet By Mouth Once A Day For Blood Pressure 6)  Paxil 30 Mg Tabs (Paroxetine Hcl) .... Take 1 Tablet By Mouth Once A Day For Depression 7)  Prevacid 30 Mg Cpdr (Lansoprazole) .... Take 1 Capsule By Mouth Once A Day For Stomach 8)  Relion 70/30 70-30 % Susp (Insulin Isophane & Regular) .... Inject 50 Unit Subcutaneously in Am and 10 in Pm.  Disp Qs For 3 Months 9)  Toprol Xl 100 Mg Tb24 (Metoprolol Succinate) .Marland Kitchen.. 1 By Mouth Daily For Blood Pressure 10)  Bl Vitamin E Natural 400 Unit  Caps (Vitamin E) .Marland Kitchen.. 1 Tablet A Day 11)   Vitamin C .... 1 Tablet A Day 12)  Fish Oil Concentrate 1000 Mg  Caps (Omega-3 Fatty Acids) .Marland Kitchen.. 1 Tablet Daily 13)  Alba-Lybe   Tabs (B Complex Vitamins) .Marland Kitchen.. 1 Tablet A Day 14)  Aspirin 325 Mg Tabs (Aspirin) .Marland Kitchen.. 1 Tablet A Day 15)  Nitrostat 0.4 Mg Subl (Nitroglycerin) .Marland Kitchen.. 1 Tab By Mouth Sl As Needed Chest Pain 16)  Vitamin E 600 Unit  Caps (Vitamin E) .... Take 1 Once Daily 17)  Multivitamins   Tabs (Multiple Vitamin) .... Take 1 Once Daily  Allergies: 1)  Enalapril Maleate (Enalapril Maleate)  Past History:  Past Medical History:    Nonobstructive coronary disease- Cardiologist Dr. Verl Blalock ([place])       Nuclear study low risk - 09/25/2005 EF 59%       Stress cardiolyte: neg for ischemia - 04/17/2004,        Cath/PTCA: EF 65%; drug eluting stent of mid-circumflex artery - 02/24/2002,       Sestamibi/treadmill cardiolyte: neg; EF 56% - 06/28/1999       Nuclear stress test negative for inschemia- 8/09    HTN      hx of hypokalemia    hyperlipidemia    Carotid doppler: normal - 06/27/1998     Type II DM with complications: Diabetic macular edema, nephropathy (proteinuria), retinopathy    h/o EtOH withdrawal/seizures/pancreatitis  h/o gastric ulcer           Colonoscopy: colon polyps (benign) - 10/26/2002       Upper GI neg 2000       Hpylori neg 2000    presbyopia, hyperopia     (08/05/2008)  Family History:    6 siblings: DM, HTN, gout, migraines,     Breast CA 1st degree,     Diabetes 1st degree,     Father: CHF, dementia,     Mother: DM, HTN, CVA, breast CA,       neg for CAD      (11/18/2007)  Risk Factors:    Alcohol Use: N/A    >5 drinks/d w/in last 3 months: N/A    Caffeine Use: N/A    Diet: N/A    Exercise: N/A  Review of Systems  The patient denies anorexia, weight loss, weight gain, vision loss, chest pain, syncope, dyspnea on exertion, peripheral edema, prolonged cough, and muscle weakness.         please That see history of present illness for further review  of systems  Vital Signs:  Patient profile:   60 year old male Height:      73 inches Weight:      224 pounds Pulse rate:   59 / minute BP sitting:   135 / 75  (left arm)  Vitals Entered By: Eliezer Lofts, EMT-P (August 30, 2008 2:44 PM)  Physical Exam  General:  obese.   Head:  normocephalic and atraumatic Eyes:  PERRLA/EOM intact; conjunctiva and lids normal. Mouth:  Teeth, gums and palate normal. Oral mucosa normal. Neck:  Neck supple, no JVD. No masses, thyromegaly or abnormal cervical nodes. Chest Anthony [name]:  no deformities or breast masses noted Lungs:  Clear bilaterally to auscultation and percussion. Heart:  Non-displaced PMI, chest non-tender; regular rate and rhythm, S1, S2 without murmurs, rubs or gallops. Carotid upstroke normal, no bruit. Normal abdominal aortic size, no bruits. Femorals normal pulses, no bruits. Pedals normal pulses. No edema, no varicosities. Abdomen:  Bowel sounds positive; abdomen soft and non-tender without masses, organomegaly, or hernias noted. No hepatosplenomegaly. Msk:  decreased ROM.   Pulses:  pulses normal in all 4 extremities Extremities:  No clubbing or cyanosis.trace left pedal edema and trace right pedal edema.   Skin:  Intact without lesions or rashes. Psych:  Normal affect.   Impression & Recommendations:  Problem # 1:  CORONARY, ARTERIOSCLEROSIS (ICD-414.00) Assessment Unchanged  His updated medication list for this problem includes:    Isosorbide Mononitrate Cr 60 Mg Tb24 (Isosorbide mononitrate) .Marland Kitchen... Take 1 tablet by mouth once a day for blood pressure    Norvasc 10 Mg Tabs (Amlodipine besylate) .Marland Kitchen... Take 1/2  tablet by mouth once a day for blood pressure    Toprol Xl 100 Mg Tb24 (Metoprolol succinate) .Marland Kitchen... 1 by mouth daily for blood pressure    Aspirin 325 Mg Tabs (Aspirin) .Marland Kitchen... 1 tablet a day    Nitrostat 0.4 Mg Subl (Nitroglycerin) .Marland Kitchen... 1 tab by mouth sl as needed chest pain  Problem # 2:  HYPERCHOLESTEROLEMIA  (ICD-272.0) Assessment: Unchanged  His updated medication list for this problem includes:    Lipitor 10 Mg Tabs (Atorvastatin calcium) .Marland Kitchen... Take 1 tablet by mouth every night for cholesterol He can afford a Lipitor. We'll switch him to simvastatin 20 mg q.h.s. with followup blood work in about 6-8 weeks. I've asked him to get this to family practice.  Patient Instructions: 1)  Your physician recommends that you schedule a follow-up appointment in: 12 months 2)  Your physician recommends that you return for a FASTING lipid profile: 6-8 weeks at family practice

## 2010-06-26 NOTE — Assessment & Plan Note (Signed)
Summary: f/u,df   Vital Signs:  Patient profile:   60 year old male Height:      72 inches Weight:      219 pounds BMI:     29.81 Temp:     98.7 degrees F Pulse rate:   68 / minute BP sitting:   157 / 85  (right arm) Cuff size:   regular  Vitals Entered By: Elray Mcgregor RN (March 30, 2009 4:33 PM) CC: Follow up diabetes and HTN Is Patient Diabetic? Yes Pain Assessment Patient in pain? no        Primary Care Provider:  Briscoe Deutscher DO  CC:  Follow up diabetes and HTN.  History of Present Illness: 60 yo AAM with  1. Diabetes: Rx Metformin, Novolin 70/30: 50 in am, 10 in pm, ASA. A1c today = 8.0 which is virtually unchanged from his last A1c of 8.1. He has not been checking his BG at home. Exercise = none. Diet: avoiding sugar and bread. Diabetes complications include diabetic macular edema, nephropathy (proteinuria), neuropathy, and retinopathy. He denies CP, DOE, N/V/D, HA, dizziness, increased numbness/tingling in his hands or feet.   2. HTN: Rx Metoprolol, Lasix, Imdur, HCTZ. He has not started the Cozaar that was prescribed at his last visit. He did increase his Lasix to one tab daily to better control his LE edema. Cardiologist is Dr. Verl Blalock with [facility], last visit 08/2008.  Last myoview in 2009: EF 68%, no ischemia. Note: amlodipine caused peripheral edema.  Habits & Providers  Alcohol-Tobacco-Diet     Tobacco Status: quit > 6 months  Current Medications (verified): 1)  Isosorbide Mononitrate Cr 60 Mg Tb24 (Isosorbide Mononitrate) .... Take 1 Tablet By Mouth Once A Day For Blood Pressure 2)  Metformin Hcl 1000 Mg Tabs (Metformin Hcl) .Marland Kitchen.. 1 Tablet Twice A Day For Diabetes 3)  Paroxetine Hcl 20 Mg  Tabs (Paroxetine Hcl) .... One and One-Half Tabs By Mouth Daily 4)  Prevacid 30 Mg Cpdr (Lansoprazole) .... Take 1 Capsule By Mouth Once A Day For Stomach 5)  Novolin 70/30 70-30 % Susp (Insulin Isophane & Regular) .... 50 Units in The Am and 10 Units in The Pm 6)   Metoprolol Tartrate 100 Mg Tabs (Metoprolol Tartrate) .Marland Kitchen.. 1 Tablet By Mouth Two Times A Day For Blood Pressure 7)  Fish Oil Concentrate 1000 Mg  Caps (Omega-3 Fatty Acids) .Marland Kitchen.. 1 Tablet Daily 8)  Adult Aspirin Ec Low Strength 81 Mg Tbec (Aspirin) .Marland Kitchen.. 1 Tablet By Mouth Daily 9)  Nitrostat 0.4 Mg Subl (Nitroglycerin) .Marland Kitchen.. 1 Tab By Mouth Sl As Needed Chest Pain 10)  Multivitamins   Tabs (Multiple Vitamin) .... Take 1 Once Daily 11)  Oxycontin 10 Mg Xr12h-Tab (Oxycodone Hcl) .Marland Kitchen.. 1 Tablet By Mouth Three Times A Day- Prescribed By Pain Doctor 12)  Simvastatin 10 Mg Tabs (Simvastatin) .Marland Kitchen.. 1 At Bedtime 13)  Lasix 20 Mg Tabs (Furosemide) .... Take 1 By Mouth Daily 14)  Cozaar 25 Mg Tabs (Losartan Potassium) .... Take On By Mouth Daily  Allergies (verified): 1)  Enalapril Maleate (Enalapril Maleate)  Past History:  Past medical, surgical, family and social histories (including risk factors) reviewed, and no changes noted (except as noted below).  Past Medical History: Nonobstructive coronary disease - Cardiologist Dr. Verl Blalock ([place])    Myoview: EF 68%, no ischemia - 2009    Nuclear study low risk:  EF 59% - 09/25/2005    Stress cardiolyte: neg for ischemia - 04/17/2004    Cath/PTCA: EF  65%; drug eluting stent of mid-circumflex artery - 02/24/2002    Sestamibi/treadmill cardiolyte: neg; EF 56% - 06/28/1999    Nuclear stress test negative for inschemia- 8/09 HTN   Hyperlipidemia Carotid doppler: normal - 06/27/1998  Type II DM with complications: Diabetic macular edema, nephropathy (proteinuria), retinopathy, neuropathy h/o EtOH withdrawal/seizures/pancreatitis h/o Gastric Ulcer        Colonoscopy: colon polyps (benign) - 10/26/2002    Upper GI neg 2000    Hpylori neg 2000 Presbyopia, hyperopia h/o chronic low back pain- prescribed oxycontin three times a day by pain specialist (worker's comp case)  Past Surgical History: Reviewed history from 12/18/2007 and no changes required. Cath/PTCA: EF  65%; stenting of mid-circumflex artery - 02/24/2002 Epidural injection for back pain - 01/25/2001  Family History: Reviewed history from 11/18/2007 and no changes required. 6 siblings: DM, HTN, gout, migraines,  Breast CA 1st degree,  Diabetes 1st degree,  Father: CHF, dementia,  Mother: DM, HTN, CVA, breast CA,    neg for CAD       Social History: Reviewed history from 09/16/2008 and no changes required. Works at [employer], but hours recently cut to part-time and he recently lost his health insurance. -No alcohol currently (h/o heavy use--sober since [id number]); -Denies illicit drug use; -Married to [employer]; -Has 2 biological children & 2 step-children.  Quiting smoking currently.    Smoking Status:  quit > 6 months  Review of Systems       He denies CP, DOE, cough, N/V/D, HA, dizziness, vision changes, polyuria, polydipsia, increased numbness/tingling in his hands or feet.   Physical Exam  General:  Well-developed, well-nourished, in no acute distress; alert, appropriate and cooperative throughout examination. Appears older than age. Vital signs reviewed. Neck:  No deformities, masses, or tenderness noted. Lungs:  Normal respiratory effort, chest expands symmetrically. Lungs are clear to auscultation, no crackles or wheezes. Heart:  Regular rhythm, no murmur, no gallop, no rub, no JVD. Pulses:  R posterior tibial normal, R dorsalis pedis normal, L posterior tibial normal, and L dorsalis pedis normal.   Extremities:  No edema. Skin:  Stasis changes to lower legs bilaterally. Psych:  Oriented X3, memory intact for recent and remote, normally interactive, good eye contact, and not anxious appearing.     Impression & Recommendations:  Problem # 1:  DIABETES MELLITUS, II, COMPLICATIONS (A999333) Assessment Unchanged Discussed the importance of monitoring blood sugar when taking insulin. Also discussed the benefits of Cozaar in lowering blood pressure, maintatining potassium levels,  protecting the kidneys in diabetics, and post-MI remodeling. His updated medication list for this problem includes:    Metformin Hcl 1000 Mg Tabs (Metformin hcl) .Marland Kitchen... 1 tablet twice a day for diabetes    Novolin 70/30 70-30 % Susp (Insulin isophane & regular) .Marland KitchenMarland KitchenMarland KitchenMarland Kitchen 50 units in the am and 10 units in the pm    Adult Aspirin Ec Low Strength 81 Mg Tbec (Aspirin) .Marland Kitchen... 1 tablet by mouth daily    Cozaar 25 Mg Tabs (Losartan potassium) .Marland Kitchen... Take on by mouth daily  Orders: [facility] [id number]) [place]- Est Level  3 [id number])  Problem # 2:  HYPERTENSION, BENIGN SYSTEMIC (ICD-401.1) Assessment: Unchanged  Stop HCTZ. Start Cozaar. Recheck BMP.  The following medications were removed from the medication list:    Hydrochlorothiazide 25 Mg Tabs (Hydrochlorothiazide) .Marland Kitchen... 1 tablet by mouth daily for blood pressure His updated medication list for this problem includes:    Metoprolol Tartrate 100 Mg Tabs (Metoprolol tartrate) .Marland Kitchen... 1 tablet by mouth two  times a day for blood pressure    Lasix 20 Mg Tabs (Furosemide) .Marland Kitchen... Take 1 by mouth daily    Cozaar 25 Mg Tabs (Losartan potassium) .Marland Kitchen... Take on by mouth daily  Orders: [facility]- Est Level  3 [id number])  Problem # 3:  LEG EDEMA, BILATERAL (ICD-782.3) Assessment: Improved  The following medications were removed from the medication list:    Hydrochlorothiazide 25 Mg Tabs (Hydrochlorothiazide) .Marland Kitchen... 1 tablet by mouth daily for blood pressure His updated medication list for this problem includes:    Lasix 20 Mg Tabs (Furosemide) .Marland Kitchen... Take 1 by mouth daily  Orders: [facility]- Est Level  3 [id number])  Problem # 4:  HYPERCHOLESTEROLEMIA (ICD-272.0) Will need FLP. His updated medication list for this problem includes:    Simvastatin 10 Mg Tabs (Simvastatin) .Marland Kitchen... 1 at bedtime  Problem # 5:  OBESITY, NOS (ICD-278.00) Assessment: Unchanged Encouraged healthy diet and regular exercise.  Orders: [facility]- Est Level  3 [id number])  Complete Medication List: 1)  Isosorbide  Mononitrate Cr 60 Mg Tb24 (Isosorbide mononitrate) .... Take 1 tablet by mouth once a day for blood pressure 2)  Metformin Hcl 1000 Mg Tabs (Metformin hcl) .Marland Kitchen.. 1 tablet twice a day for diabetes 3)  Paroxetine Hcl 20 Mg Tabs (Paroxetine hcl) .... One and one-half tabs by mouth daily 4)  Prevacid 30 Mg Cpdr (Lansoprazole) .... Take 1 capsule by mouth once a day for stomach 5)  Novolin 70/30 70-30 % Susp (Insulin isophane & regular) .... 50 units in the am and 10 units in the pm 6)  Metoprolol Tartrate 100 Mg Tabs (Metoprolol tartrate) .Marland Kitchen.. 1 tablet by mouth two times a day for blood pressure 7)  Fish Oil Concentrate 1000 Mg Caps (Omega-3 fatty acids) .Marland Kitchen.. 1 tablet daily 8)  Adult Aspirin Ec Low Strength 81 Mg Tbec (Aspirin) .Marland Kitchen.. 1 tablet by mouth daily 9)  Nitrostat 0.4 Mg Subl (Nitroglycerin) .Marland Kitchen.. 1 tab by mouth sl as needed chest pain 10)  Multivitamins Tabs (Multiple vitamin) .... Take 1 once daily 11)  Oxycontin 10 Mg Xr12h-tab (Oxycodone hcl) .Marland Kitchen.. 1 tablet by mouth three times a day- prescribed by pain doctor 12)  Simvastatin 10 Mg Tabs (Simvastatin) .Marland Kitchen.. 1 at bedtime 13)  Lasix 20 Mg Tabs (Furosemide) .... Take 1 by mouth daily 14)  Cozaar 25 Mg Tabs (Losartan potassium) .... Take on by mouth daily  Other Orders: Influenza Vaccine NON MCR [id number])  Patient Instructions: 1)  It was nice to see you today! 2)  Your A1c = 8.  It is important that you check your blood sugar regularly when you are taking insulin. 3)  Stop the HCTZ. 4)  Start the Cozaar. Prescriptions: SIMVASTATIN 10 MG TABS (SIMVASTATIN) 1 at bedtime  #90 x 3   Entered and Authorized by:   Briscoe Deutscher DO   Signed by:   Briscoe Deutscher DO on 04/02/2009   Method used:   [facility]ron[facility] to        [facility] [address] [phone]* (retail)       [address]       [place], [place]  [address]       Ph: [phone] or [phone]       Fax: [phone]   RxID:   [id number] METOPROLOL TARTRATE 100 MG  TABS (METOPROLOL TARTRATE) 1 tablet by mouth two times a day for blood pressure  #180 x 3   Entered and Authorized by:   Briscoe Deutscher  DO   Signed by:   Briscoe Deutscher DO on 04/02/2009   Method used:   [facility]        [facility] [address] [phone]* (retail)       [address]       [place], [place]  [address]       Ph: [phone] or [phone]       Fax: [phone]   RxID:   [id number] NOVOLIN 70/30 70-30 % SUSP (INSULIN ISOPHANE & REGULAR) 50 units in the am and 10 units in the pm  #3 x 3   Entered and Authorized by:   Briscoe Deutscher DO   Signed by:   Briscoe Deutscher DO on 04/02/2009   Method used:   [facility]ron[facility] to        [facility] [address] [phone]* (retail)       [address]       [place], [place]  [address]       Ph: [phone] or [phone]       Fax: [phone]   RxID:   [id number] PREVACID 30 MG CPDR (LANSOPRAZOLE) Take 1 capsule by mouth once a day for stomach  #90 x 3   Entered and Authorized by:   Briscoe Deutscher DO   Signed by:   Briscoe Deutscher DO on 04/02/2009   Method used:   [facility]ronically to        [facility] [address] [phone]* (retail)       [address]       [place], [place]  [address]       Ph: [phone] or [phone]       Fax: [phone]   RxID:   [id number] PAROXETINE HCL 20 MG  TABS (PAROXETINE HCL) one and one-half tabs by mouth daily  #135 x 3   Entered and Authorized by:   Briscoe Deutscher DO   Signed by:   Briscoe Deutscher DO on 04/02/2009   Method used:   Electronically to        [facility] [address] [phone]* (retail)       [place] [address]       [place], [place]  [address]       Ph: [phone] or [phone]       Fax: [phone]   RxID:   [id number] METFORMIN HCL 1000 MG TABS (METFORMIN HCL) 1 tablet twice a day for diabetes  #180 x 3   Entered and Authorized by:   Briscoe Deutscher DO   Signed by:   Briscoe Deutscher DO on  04/02/2009   Method used:   [facility]ronically to        [facility] [address] [phone]* (retail)       [address]       [place], [place]  [address]       Ph: [phone] or [phone]       Fax: [phone]   RxID:   [id number] LASIX 20 MG TABS (FUROSEMIDE) take 1 by mouth daily  #90 x 3   Entered and Authorized by:   Briscoe Deutscher DO   Signed by:   Briscoe Deutscher DO on 04/02/2009   Method used:   Electronically to        [facility]  [address] [phone]* (retail)       [place] [address]       [place], [place]  [address]       Ph: [phone] or [phone]       Fax: [phone]   RxID:   [id number] COZAAR 25 MG TABS ([facility]) take on by mouth daily  #90 x 0   Entered and Authorized by:   Briscoe Deutscher DO   Signed by:   Briscoe Deutscher DO on 03/30/2009   Method used:   [facility]ron[facility] to        [facility] [address] [phone]* (retail)       [address]       [place],   [address]       Ph: [phone] or [phone]       Fax: [phone]   RxID:   [id number]   Laboratory Results   Blood Tests   Date/Time Received: March 30, 2009 4:38 PM  Date/Time Reported: March 30, 2009 4:45 PM   HGBA1C: 8.0%   (Normal Range: Non-Diabetic - 3-6%   Control Diabetic - 6-8%)  Comments: ...........test performed by...........Marland KitchenHedy Camara, CMA         Influenza Vaccine    Vaccine Type: Fluvax Non-MCR    Site: left deltoid    Mfr: GlaxoSmithKline    Dose: 0.5 ml    Route: IM    Given by: Elray Mcgregor RN    Exp. Date: 11/23/2009    Lot #: [id number]    VIS given: 8.10.10  Flu Vaccine Consent Questions    Do you have a history of severe allergic reactions to this vaccine? no    Any prior history of allergic reactions to egg and/or gelatin? no    Do you have a sensitivity to the preservative Thimersol? no    Do you have a past history of Guillan-Barre Syndrome? no    Do you  currently have an acute febrile illness? no    Have you ever had a severe reaction to latex? no    Vaccine information given and explained to patient? yes  Prevention & Chronic Care Immunizations   Influenza vaccine: Fluvax Non-MCR  (03/30/2009)   Influenza vaccine due: 03/08/2008    Tetanus booster: 11/24/2001: Done.   Tetanus booster due: 11/25/2011    Pneumococcal vaccine: Done.  (03/27/2004)   Pneumococcal vaccine due: None  Colorectal Screening   Hemoccult: Done.  (07/26/2002)   Hemoccult due: Not Indicated    Colonoscopy: Done.  (10/26/2002)   Colonoscopy due: 10/25/2012  Other Screening   PSA: 0.52  (03/09/2007)   PSA due due: 03/08/2008   Smoking status: quit > 6 months  (03/30/2009)  Diabetes Mellitus   HgbA1C: 8.0  (03/30/2009)   Hemoglobin A1C due: 12/23/2007    Eye exam: Not documented    Foot exam: yes  (11/07/2008)   Foot exam action/deferral: Not indicated   High risk foot: Not documented   Foot care education: Not documented   Foot exam due: 02/23/2009    Urine microalbumin/creatinine ratio: Not documented    Diabetes flowsheet reviewed?: Yes   Progress toward A1C goal: Unchanged  Lipids   Total Cholesterol: 110  (10/20/2008)   LDL: 59  (10/20/2008)   LDL Direct: Not documented   HDL: 40  (10/20/2008)   Triglycerides: 53  (10/20/2008)    SGOT (AST): 18  (10/20/2008)  SGPT (ALT): 20  (10/20/2008)   Alkaline phosphatase: 103  (10/20/2008)   Total bilirubin: 0.8  (10/20/2008)    Lipid flowsheet reviewed?: Yes   Progress toward LDL goal: At goal  Hypertension   Last Blood Pressure: 157 / 85  (03/30/2009)   Serum creatinine: 0.95  (10/20/2008)   Serum potassium 3.9  (10/20/2008)    Hypertension flowsheet reviewed?: Yes   Progress toward BP goal: Unchanged  Self-Management Support :   Personal Goals (by the next clinic visit) :     Personal A1C goal: 7  (03/30/2009)     Personal blood pressure goal: 130/80  (01/10/2009)     Personal  LDL goal: 70  (03/30/2009)    Patient will work on the following items until the next clinic visit to reach self-care goals:     Medications and monitoring: take my medicines every day, check my blood sugar, bring all of my medications to every visit  (03/30/2009)     Eating: drink diet soda or water instead of juice or soda, eat more vegetables, use fresh or frozen vegetables, eat foods that are low in salt, eat baked foods instead of fried foods, eat fruit for snacks and desserts, limit or avoid alcohol  (03/30/2009)     Activity: take a 30 minute walk every day  (03/30/2009)    Diabetes self-management support: Written self-care plan  (03/30/2009)   Diabetes care plan printed    Hypertension self-management support: Written self-care plan  (03/30/2009)   Hypertension self-care plan printed.    Lipid self-management support: Written self-care plan  (03/30/2009)   Lipid self-care plan printed.

## 2010-06-26 NOTE — Assessment & Plan Note (Signed)
Summary: FU & toenail trim and patient summary    Vital Signs:  Patient profile:   60 year old male Height:      73 inches Weight:      225.3 pounds BMI:     29.83 Pulse rate:   79 / minute BP sitting:   151 / 81  (left arm)  Vitals Entered By: Mauricia Area CMA, (November 07, 2008 4:01 PM) CC: f/up DM. refill meds. trim nails. Is Patient Diabetic? Yes  Pain Assessment Patient in pain? no        Primary Care Provider:  [name] MD  CC:  f/up DM. refill meds. trim nails..  History of Present Illness: 1.  HTN- meds changed at last visit  since he lost insurance and is paying for meds out of pocket.  Still has not picked up amlodipine, but is taking other meds as prescribed.  Still taking hyzaar, but will switch to HCTZ once that medication bottle runs out.  2.  fungal nail infection- nails difffiult to trim.  Thinks he cut left great toenail too short and now it is tender, but no pus drainage.  Requesting toenail trim today.  3.  DM2- still taking metformin and insulin.  Unable to check sugars b/c meter is broken.  Has ordered new meter and it should arrive soon.  Did not really check sugars regularly when he had a working meter, though.  Admits he did not take insulin for about 10 days recently b/c he ran out of needles, now taking again.  Habits & Providers  Alcohol-Tobacco-Diet     Tobacco Status: quit  Allergies: 1)  Enalapril Maleate (Enalapril Maleate)  Physical Exam  General:  alert, well-developed, well-nourished, and well-hydrated.   Lungs:  Normal respiratory effort, chest expands symmetrically. Lungs are clear to auscultation, no crackles or wheezes. Heart:  Normal rate and regular rhythm. S1 and S2 normal without gallop, murmur, click, rub or other extra sounds. Extremities:  no edema Neurologic:  alert & oriented X3.    Diabetes Management Exam:    Foot Exam (with socks and/or shoes not present):       Inspection:          Left foot: normal          Right  foot: normal       Nails:          Left foot: fungal infection          Right foot: fungal infection   Impression & Recommendations:  Problem # 1:  HYPERTENSION, BENIGN SYSTEMIC (ICD-401.1) HTN- uncontrolled.  Needs to go p/u norvasc Rx.    Pt's BP previously controlled, but he was on several brand name meds and recently lost insurance.  Attempt to maintain on generic meds in future if possible.  His updated medication list for this problem includes:    Norvasc 10 Mg Tabs (Amlodipine besylate) .Marland Kitchen... Take 1/2  tablet by mouth once a day for blood pressure    Metoprolol Tartrate 100 Mg Tabs (Metoprolol tartrate) .Marland Kitchen... 1 tablet by mouth two times a day for blood pressure    Hydrochlorothiazide 25 Mg Tabs (Hydrochlorothiazide) .Marland Kitchen... 1 tablet by mouth daily for blood pressure  Orders: [place]- Est  Level 4 (99214)  Problem # 2:  ONYCHOMYCOSIS, TOENAILS (ICD-110.1) nails trimmed today.  advised on proper foot care.  Orders: [place]- Est  Level 4 [id number])  Problem # 3:  DIABETES MELLITUS, II, COMPLICATIONS (A999333) A1C worsened compared to last lab-  perhaps due to being out of insulin for 10 days recently. Increase insulin to 50 units in AM and 15 units at bedtime.  Pt rarely checks blood sugars.  His updated medication list for this problem includes:    Metformin Hcl 1000 Mg Tabs (Metformin hcl) .Marland Kitchen... 1 tablet twice a day for diabetes    Relion 70/30 70-30 % Susp (Insulin isophane & regular) ..... Inject 50 unit subcutaneously in am and 10 in pm.  disp qs for 3 months    Adult Aspirin Ec Low Strength 81 Mg Tbec (Aspirin) .Marland Kitchen... 1 tablet by mouth daily  Problem # 4:  HYPERCHOLESTEROLEMIA (ICD-272.0) lipids at goal 09/2008 on lipitor, but changed to simvastatin recently due to cost.  consider f/u lipid profile while on simvastatin. His updated medication list for this problem includes:    Simvastatin 20 Mg Tabs (Simvastatin) .Marland Kitchen... 1 tab at bedtime  Problem # 5:  CORONARY, ARTERIOSCLEROSIS  (ICD-414.00) has stent, followed by Dr. Verl Blalock [facility]).  Needs to get financially certified so he can keep going to [facility].  I explained how to go about this. His updated medication list for this problem includes:    Isosorbide Mononitrate Cr 60 Mg Tb24 (Isosorbide mononitrate) .Marland Kitchen... Take 1 tablet by mouth once a day for blood pressure    Norvasc 10 Mg Tabs (Amlodipine besylate) .Marland Kitchen... Take 1/2  tablet by mouth once a day for blood pressure    Metoprolol Tartrate 100 Mg Tabs (Metoprolol tartrate) .Marland Kitchen... 1 tablet by mouth two times a day for blood pressure    Adult Aspirin Ec Low Strength 81 Mg Tbec (Aspirin) .Marland Kitchen... 1 tablet by mouth daily    Nitrostat 0.4 Mg Subl (Nitroglycerin) .Marland Kitchen... 1 tab by mouth sl as needed chest pain    Hydrochlorothiazide 25 Mg Tabs (Hydrochlorothiazide) .Marland Kitchen... 1 tablet by mouth daily for blood pressure  Problem # 6:  DEPRESSIVE DISORDER, NOS (ICD-311) has always had very flat affect, now worse since loss of insurance and cutback in hours at work. His updated medication list for this problem includes:    Paxil 30 Mg Tabs (Paroxetine hcl) .Marland Kitchen... Take 1 tablet by mouth once a day for depression  Complete Medication List: 1)  Isosorbide Mononitrate Cr 60 Mg Tb24 (Isosorbide mononitrate) .... Take 1 tablet by mouth once a day for blood pressure 2)  Simvastatin 20 Mg Tabs (Simvastatin) .Marland Kitchen.. 1 tab at bedtime 3)  Metformin Hcl 1000 Mg Tabs (Metformin hcl) .Marland Kitchen.. 1 tablet twice a day for diabetes 4)  Norvasc 10 Mg Tabs (Amlodipine besylate) .... Take 1/2  tablet by mouth once a day for blood pressure 5)  Paxil 30 Mg Tabs (Paroxetine hcl) .... Take 1 tablet by mouth once a day for depression 6)  Prevacid 30 Mg Cpdr (Lansoprazole) .... Take 1 capsule by mouth once a day for stomach 7)  Relion 70/30 70-30 % Susp (Insulin isophane & regular) .... Inject 50 unit subcutaneously in am and 10 in pm.  disp qs for 3 months 8)  Metoprolol Tartrate 100 Mg Tabs (Metoprolol tartrate)  .Marland Kitchen.. 1 tablet by mouth two times a day for blood pressure 9)  Fish Oil Concentrate 1000 Mg Caps (Omega-3 fatty acids) .Marland Kitchen.. 1 tablet daily 10)  Adult Aspirin Ec Low Strength 81 Mg Tbec (Aspirin) .Marland Kitchen.. 1 tablet by mouth daily 11)  Nitrostat 0.4 Mg Subl (Nitroglycerin) .Marland Kitchen.. 1 tab by mouth sl as needed chest pain 12)  Multivitamins Tabs (Multiple vitamin) .... Take 1 once daily 13)  Hydrochlorothiazide  25 Mg Tabs (Hydrochlorothiazide) .Marland Kitchen.. 1 tablet by mouth daily for blood pressure 14)  Oxycontin 10 Mg Xr12h-tab (Oxycodone hcl) .Marland Kitchen.. 1 tablet by mouth three times a day- prescribed by pain doctor  Other Orders: [facility] [id number])  Patient Instructions: 1)  increase bedtime insulin to 15 units.  continue 50 units in morning. 2)  go pick up amlodipine because your pressure is high today. 3)  follow up in 3 months for diabetic check. Prescriptions: HYDROCHLOROTHIAZIDE 25 MG TABS (HYDROCHLOROTHIAZIDE) 1 tablet by mouth daily for blood pressure  #30 x 6   Entered and Authorized by:   [name] MD   Signed by:   [name] MD on 11/07/2008   Method used:   Print then Give to Patient   RxID:   [id number] METOPROLOL TARTRATE 100 MG TABS (METOPROLOL TARTRATE) 1 tablet by mouth two times a day for blood pressure  #60 x 6   Entered and Authorized by:   [name] MD   Signed by:   [name] MD on 11/07/2008   Method used:   Print then Give to Patient   RxID:   [id number] RELION 70/30 70-30 % SUSP (INSULIN ISOPHANE & REGULAR) Inject 50 unit subcutaneously in AM and 10 in PM.  disp qs for 3 months  #1 x 6   Entered and Authorized by:   [name] MD   Signed by:   [name] MD on 11/07/2008   Method used:   Print then Give to Patient   RxID:   [id number] PAXIL 30 MG TABS (PAROXETINE HCL) Take 1 tablet by mouth once a day for depression  #30 x 6   Entered and Authorized by:   [name] MD   Signed by:   [name] MD on 11/07/2008   Method used:   Print then Give to Patient   RxID:    [id number] NORVASC 10 MG TABS (AMLODIPINE BESYLATE) Take 1/2  tablet by mouth once a day for blood pressure  #30 x 6   Entered and Authorized by:   [name] MD   Signed by:   [name] MD on 11/07/2008   Method used:   Print then Give to Patient   RxID:   [id number] METFORMIN HCL 1000 MG TABS (METFORMIN HCL) 1 tablet twice a day for diabetes  #60 x 6   Entered and Authorized by:   [name] MD   Signed by:   [name] MD on 11/07/2008   Method used:   Print then Give to Patient   RxID:   [id number] SIMVASTATIN 20 MG TABS (SIMVASTATIN) 1 tab at bedtime  #30 x 6   Entered and Authorized by:   [name] MD   Signed by:   [name] MD on 11/07/2008   Method used:   Print then Give to Patient   RxID:   [id number] ISOSORBIDE MONONITRATE CR 60 MG TB24 (ISOSORBIDE MONONITRATE) Take 1 tablet by mouth once a day for blood pressure  #30 x 6   Entered and Authorized by:   [name] MD   Signed by:   [name] MD on 11/07/2008   Method used:   Print then Give to Patient   RxID:   [id number]   Laboratory Results   Blood Tests   Date/Time Received: November 07, 2008 4:00 PM  Date/Time Reported: November 07, 2008 4:32 PM   HGBA1C: 8.1%   (Normal Range: Non-Diabetic - 3-6%   Control Diabetic - 6-8%)  Comments: ..........Marland Kitchen  test performed by...........Marland KitchenHedy Camara, CMA

## 2010-06-26 NOTE — Progress Notes (Signed)
Summary: Medication question  Phone Note Call from Patient Call back at Home Phone [phone]   Reason for Call: Talk to Nurse Summary of Call: pt is wanting to speak with someone regarding his medications Initial call taken by: Drucie Ip,  September 24, 2006 2:05 PM  Follow-up for Phone Call        left message for him to call back Follow-up by: Elige Radon RN,  Sep 26, 2006 11:05 AM

## 2010-06-26 NOTE — Progress Notes (Signed)
Summary: triage  Phone Note Call from Patient Call back at Home Phone [phone]   Caller: Patient Summary of Call: forgot to mention to doctor yesterday that he had been exposed to strep throat - he has a really bad sore throat Initial call taken by: Audie Clear,  July 13, 2009 9:52 AM  Follow-up for Phone Call        he thinks his sore throat is strep. appt at 11am work in. aware of wait Follow-up by: Elige Radon RN,  July 13, 2009 10:01 AM

## 2010-06-26 NOTE — Assessment & Plan Note (Signed)
Summary: F/U DM   Vital Signs:  Patient Profile:   60 Years Old Male Height:     74 inches Weight:      220 pounds BMI:     28.35 Temp:     98.1 degrees F Pulse rate:   56 / minute BP sitting:   109 / 62  (right arm)  Pt. in pain?   no  Vitals Entered By: Elray Mcgregor RN (December 18, 2007 9:41 AM)                  PCP:  [name] MD  Chief Complaint:  Follow up diabetes and HTN.  History of Present Illness: 1.  HTN- taking hyzaar, clonidine, norvasc, clonidine, and spironolactone as prescribed.  His meds have been changed significantly by Dr. Erling Cruz recently, but today his BP is excellent.  He is feeling great and reports no side effects, so does not want to change any of his meds.  Of note, he follows yearly with Dr. Verl Blalock for h/o CAD with stent placement.  He's unsure when his next appointment is scheduled, but he has started to develop chest pain since his last f/u visit with Dr. Verl Blalock.  He currently does not have chest pain & it only happens with extreme exertion.  2.  DM2- has not been checking his sugars recently.  He last checked 1.5 weeks ago and it was in the 200's, but he had just eaten.  He has regular eye appointments with his eye doctor and reports no recent visual changes.      Prior Medication List:  HYZAAR 100-25 MG TABS (LOSARTAN POTASSIUM-HCTZ) Take 1 tablet by mouth once a day ISOSORBIDE MONONITRATE CR 60 MG TB24 (ISOSORBIDE MONONITRATE) Take 1 tablet by mouth once a day LIPITOR 10 MG TABS (ATORVASTATIN CALCIUM) Take 1 tablet by mouth every night METFORMIN HCL 1000 MG TABS (METFORMIN HCL) 1 tablet twice a day for diabetes NORVASC 10 MG TABS (AMLODIPINE BESYLATE) Take 1/2  tablet by mouth once a day PAXIL 30 MG TABS (PAROXETINE HCL) Take 1 tablet by mouth once a day PREVACID 30 MG CPDR (LANSOPRAZOLE) Take 1 capsule by mouth once a day RELION 70/30 70-30 % SUSP (INSULIN ISOPHANE & REGULAR) Inject 50 unit subcutaneously in AM and 10 in PM.  disp qs TOPROL XL 100  MG TB24 (METOPROLOL SUCCINATE) 1 by mouth daily, not decrease in dose. BL VITAMIN E NATURAL 400 UNIT  CAPS (VITAMIN E) 1 tablet a day * VITAMIN C 1 tablet a day FISH OIL CONCENTRATE 1000 MG  CAPS (OMEGA-3 FATTY ACIDS) 1 tablet daily ALBA-LYBE   TABS (B COMPLEX VITAMINS) 1 tablet a day ASPIRIN 325 MG TABS (ASPIRIN) 1 tablet a day SPIRONOLACTONE 25 MG TABS (SPIRONOLACTONE) 1 tablet 1 time a day for blood pressure and to raise potassium VOLTAREN 1 %  GEL (DICLOFENAC SODIUM) two times a day  to qid as needed to elbow for pain. CELEBREX 400 MG  CAPS (CELECOXIB) 1 tablet for pain a day EQ NICOTINE 14 MG/24HR  PT24 (NICOTINE) use 1 per day x 2 weeks, then go to 7 mg patch CLONIDINE HCL 0.1 MG TABS (CLONIDINE HCL) 1 tablet twice a day for blood pressure   Current Allergies: ENALAPRIL MALEATE (ENALAPRIL MALEATE)  Past Medical History:    Nonobstructive coronary disease- Cardiologist Dr. Verl Blalock ([place])       Nuclear study low risk - 09/25/2005 EF 59%       Stress cardiolyte: neg for ischemia - 04/17/2004,  Cath/PTCA: EF 65%; stenting of mid-circumflex artery - 02/24/2002,       Sestamibi/treadmill cardiolyte: neg; EF 56% - 06/28/1999    HTN      hx of hypokalemia    hyperlipidemia    Carotid doppler: normal - 06/27/1998     Type II DM with complications: Diabetic macular edema, nephropathy (proteinuria), retinopathy    h/o EtOH withdrawal/seizures/pancreatitis    h/o gastric ulcer           Colonoscopy: colon polyps (benign) - 10/26/2002,        Upper GI neg 2000,          Hpylori neg 2000    presbyopia, hyperopia,   Past Surgical History:    Cath/PTCA: EF 65%; stenting of mid-circumflex artery - 02/24/2002    Epidural injection for back pain - 01/25/2001   Social History:    Employed at two part time jobs - Therapist, 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  Quiting smoking currently.       Risk Factors:     Counseled to quit/cut down tobacco use:  yes    Physical Exam  General:     NAD, pleasant Lungs:     CTAB Heart:     RRR no murmur Extremities:     no edema Neurologic:     alert & oriented X3.   Skin:     venous insuf changes b/l LE's Psych:     normally interactive, good eye contact, not anxious appearing, and not depressed appearing.      Impression & Recommendations:  Problem # 1:  HYPERTENSION, BENIGN SYSTEMIC (ICD-401.1) Well-controlled today.  Continue meds below. His updated medication list for this problem includes:    Hyzaar 100-25 Mg Tabs (Losartan potassium-hctz) .Marland Kitchen... Take 1 tablet by mouth once a day    Norvasc 10 Mg Tabs (Amlodipine besylate) .Marland Kitchen... Take 1/2  tablet by mouth once a day    Toprol Xl 100 Mg Tb24 (Metoprolol succinate) .Marland Kitchen... 1 by mouth daily, not decrease in dose.    Spironolactone 25 Mg Tabs (Spironolactone) .Marland Kitchen... 1 tablet 1 time a day for blood pressure and to raise potassium    Clonidine Hcl 0.1 Mg Tabs (Clonidine hcl) .Marland Kitchen... 1 tablet twice a day for blood pressure  Orders: [place]- Est  Level 4 [id number])   Problem # 2:  DIABETES MELLITUS, II, COMPLICATIONS (A999333) A1C 7.7.  Cont current meds.  Asked him to check his sugars and bring his log book to next visit so we can adjust insulin if necessary. His updated medication list for this problem includes:    Hyzaar 100-25 Mg Tabs (Losartan potassium-hctz) .Marland Kitchen... Take 1 tablet by mouth once a day    Metformin Hcl 1000 Mg Tabs (Metformin hcl) .Marland Kitchen... 1 tablet twice a day for diabetes    Relion 70/30 70-30 % Susp (Insulin isophane & regular) ..... Inject 50 unit subcutaneously in am and 10 in pm.  disp qs    Aspirin 325 Mg Tabs (Aspirin) .Marland Kitchen... 1 tablet a day  Orders: [facility] [id number]) [place]- Est  Level 4 [id number])   Problem # 3:  HYPERCHOLESTEROLEMIA (ICD-272.0) LDL 49 10/08, at goal. His updated medication list for this problem  includes:    Lipitor 10 Mg Tabs (Atorvastatin calcium) .Marland Kitchen... Take 1 tablet by mouth every night   Problem # 4:  CORONARY, ARTERIOSCLEROSIS (  ICD-414.00) Asked pt to make f/u visit with Dr. Verl Blalock since he has developed what sounds to be angina since his last visit with him. His updated medication list for this problem includes:    Hyzaar 100-25 Mg Tabs (Losartan potassium-hctz) .Marland Kitchen... Take 1 tablet by mouth once a day    Isosorbide Mononitrate Cr 60 Mg Tb24 (Isosorbide mononitrate) .Marland Kitchen... Take 1 tablet by mouth once a day    Norvasc 10 Mg Tabs (Amlodipine besylate) .Marland Kitchen... Take 1/2  tablet by mouth once a day    Toprol Xl 100 Mg Tb24 (Metoprolol succinate) .Marland Kitchen... 1 by mouth daily, not decrease in dose.    Aspirin 325 Mg Tabs (Aspirin) .Marland Kitchen... 1 tablet a day    Spironolactone 25 Mg Tabs (Spironolactone) .Marland Kitchen... 1 tablet 1 time a day for blood pressure and to raise potassium    Clonidine Hcl 0.1 Mg Tabs (Clonidine hcl) .Marland Kitchen... 1 tablet twice a day for blood pressure   Complete Medication List: 1)  Hyzaar 100-25 Mg Tabs (Losartan potassium-hctz) .... Take 1 tablet by mouth once a day 2)  Isosorbide Mononitrate Cr 60 Mg Tb24 (Isosorbide mononitrate) .... Take 1 tablet by mouth once a day 3)  Lipitor 10 Mg Tabs (Atorvastatin calcium) .... Take 1 tablet by mouth every night 4)  Metformin Hcl 1000 Mg Tabs (Metformin hcl) .Marland Kitchen.. 1 tablet twice a day for diabetes 5)  Norvasc 10 Mg Tabs (Amlodipine besylate) .... Take 1/2  tablet by mouth once a day 6)  Paxil 30 Mg Tabs (Paroxetine hcl) .... Take 1 tablet by mouth once a day 7)  Prevacid 30 Mg Cpdr (Lansoprazole) .... Take 1 capsule by mouth once a day 8)  Relion 70/30 70-30 % Susp (Insulin isophane & regular) .... Inject 50 unit subcutaneously in am and 10 in pm.  disp qs 9)  Toprol Xl 100 Mg Tb24 (Metoprolol succinate) .Marland Kitchen.. 1 by mouth daily, not decrease in dose. 10)  Bl Vitamin E Natural 400 Unit Caps (Vitamin e) .Marland Kitchen.. 1 tablet a day 11)  Vitamin C  .... 1 tablet  a day 12)  Fish Oil Concentrate 1000 Mg Caps (Omega-3 fatty acids) .Marland Kitchen.. 1 tablet daily 13)  Alba-lybe Tabs (B complex vitamins) .Marland Kitchen.. 1 tablet a day 14)  Aspirin 325 Mg Tabs (Aspirin) .Marland Kitchen.. 1 tablet a day 15)  Spironolactone 25 Mg Tabs (Spironolactone) .Marland Kitchen.. 1 tablet 1 time a day for blood pressure and to raise potassium 16)  Voltaren 1 % Gel (Diclofenac sodium) .... Two times a day  to qid as needed to elbow for pain. 17)  Celebrex 400 Mg Caps (Celecoxib) .Marland Kitchen.. 1 tablet for pain a day 18)  Eq Nicotine 14 Mg/24hr Pt24 (Nicotine) .... Use 1 per day x 2 weeks, then go to 7 mg patch 19)  Clonidine Hcl 0.1 Mg Tabs (Clonidine hcl) .Marland Kitchen.. 1 tablet twice a day for blood pressure   Patient Instructions: 1)  Continue your blood pressure and diabetes medications.  No changes today. 2)  Call Dr. Verl Blalock for a sooner appointment- preferably within the next month or so to let him know you have been having some chest discomfort. 3)  Check your blood sugars at least once daily.  Write them down and bring them to your next appointment. 4)  Follow up with Dr. Ronnald Ramp in 2 months.  It was nice meeting you today.   ]   Laboratory Results   Blood Tests   Date/Time Received: December 18, 2007 10:22 AM  Date/Time Reported: December 18, 2007 10:30 AM   HGBA1C: 7.7%   (Normal Range: Non-Diabetic - 3-6%   Control Diabetic - 6-8%)  Comments: ...........test performed by...........Marland KitchenHedy Camara, CMA

## 2010-06-26 NOTE — Assessment & Plan Note (Signed)
Summary: f/up,tcb   Vital Signs:  Patient profile:   60 year old male Height:      72 inches Weight:      217.6 pounds BMI:     29.62 Temp:     98.4 degrees F oral Pulse rate:   84 / minute BP sitting:   169 / 89  (left arm) Cuff size:   regular  Vitals Entered By: Levert Feinstein LPN (February 16, 624THL 9:13 AM) CC: f/u DM and HTN Is Patient Diabetic? Yes Did you bring your meter with you today? No   Primary Care Provider:  Briscoe Deutscher DO  CC:  f/u DM and HTN.  History of Present Illness: 60 yo AAM with  1. Diabetes: Rx Metformin, Novolin 70/30: 50 in am, 10 in pm, ASA. A1c today = 7.6. He has not been checking his BG at home. Exercise = none. Diet: avoiding sugar and bread. Diabetes complications include diabetic macular edema, nephropathy (proteinuria), neuropathy, and retinopathy. He denies CP, DOE, N/V/D, HA, dizziness, increased numbness/tingling in his hands or feet.   2. HTN: Rx Metoprolol, Lasix, Imdur, Cozaar. Cardiologist is Dr. Verl Blalock with [facility], last visit 08/2008. Last myoview in 2009: EF 68%, no ischemia. Note: Amlodipine caused peripheral edema.  Habits & Providers  Alcohol-Tobacco-Diet     Tobacco Status: current  Current Medications (verified): 1)  Isosorbide Mononitrate Cr 60 Mg Tb24 (Isosorbide Mononitrate) .... Take 1 Tablet By Mouth Once A Day For Blood Pressure 2)  Metformin Hcl 1000 Mg Tabs (Metformin Hcl) .Marland Kitchen.. 1 Tablet Twice A Day For Diabetes 3)  Paroxetine Hcl 20 Mg  Tabs (Paroxetine Hcl) .... One and One-Half Tabs By Mouth Daily 4)  Prevacid 30 Mg Cpdr (Lansoprazole) .... Take 1 Capsule By Mouth Once A Day For Stomach 5)  Novolin 70/30 70-30 % Susp (Insulin Isophane & Regular) .... 50 Units in The Am and 10 Units in The Pm 6)  Metoprolol Tartrate 100 Mg Tabs (Metoprolol Tartrate) .Marland Kitchen.. 1 Tablet By Mouth Two Times A Day For Blood Pressure 7)  Fish Oil Concentrate 1000 Mg  Caps (Omega-3 Fatty Acids) .Marland Kitchen.. 1 Tablet Daily 8)  Adult Aspirin Ec Low Strength  81 Mg Tbec (Aspirin) .Marland Kitchen.. 1 Tablet By Mouth Daily 9)  Nitrostat 0.4 Mg Subl (Nitroglycerin) .Marland Kitchen.. 1 Tab By Mouth Sl As Needed Chest Pain 10)  Multivitamins   Tabs (Multiple Vitamin) .... Take 1 Once Daily 11)  Oxycontin 10 Mg Xr12h-Tab (Oxycodone Hcl) .Marland Kitchen.. 1 Tablet By Mouth Three Times A Day- Prescribed By Pain Doctor 12)  Simvastatin 10 Mg Tabs (Simvastatin) .Marland Kitchen.. 1 At Bedtime 13)  Lasix 20 Mg Tabs (Furosemide) .... Take 1 By Mouth Daily 14)  Losartan Potassium 50 Mg Tabs (Losartan Potassium) .... One By Mouth Q Day  Allergies (verified): 1)  Enalapril Maleate (Enalapril Maleate)  Past History:  Past medical, surgical, family and social histories (including risk factors) reviewed for relevance to current acute and chronic problems.  Past Medical History: Nonobstructive Coronary Disease - Cardiologist Dr. Verl Blalock [facility])    Myoview: EF 68%, no ischemia - 2009    Nuclear study low risk:  EF 59% - 09/25/2005    Stress cardiolyte: neg for ischemia - 04/17/2004    Cath/PTCA: EF 65%; drug eluting stent of mid-circumflex artery - 02/24/2002    Sestamibi/treadmill cardiolyte: neg; EF 56% - 06/28/1999    Nuclear stress test negative for inschemia- 8/09 HTN   HLD Carotid doppler: normal - 06/27/1998  Type II DM with complications: Diabetic  macular edema, nephropathy (proteinuria), retinopathy, neuropathy h/o EtOH withdrawal/seizures/pancreatitis h/o Gastric Ulcer        Colonoscopy: colon polyps (benign) - 10/26/2002    Upper GI neg 2000    Hpylori neg 2000 Presbyopia, hyperopia h/o chronic low back pain- prescribed oxycontin three times a day by pain specialist (worker's comp case)  Past Surgical History: Reviewed history from 12/18/2007 and no changes required. Cath/PTCA: EF 65%; stenting of mid-circumflex artery - 02/24/2002 Epidural injection for back pain - 01/25/2001  Family History: Reviewed history from 11/18/2007 and no changes required. 6 siblings: DM, HTN, gout, migraines,  Breast CA  1st degree,  Diabetes 1st degree,  Father: CHF, dementia,  Mother: DM, HTN, CVA, breast CA,        Social History: Reviewed history from 09/16/2008 and no changes required. 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 No alcohol currently (h/o heavy use--sober since 1993); Denies illicit drug use; Married to [employer]; Has 2 biological children & 2 step-children. Smoker.Smoking Status:  current  Review of Systems       He denies CP, DOE, N/V/D, HA, dizziness, increased numbness/tingling in his hands or feet.   Physical Exam  General:  Well-developed, well-nourished, in no acute distress; alert, appropriate and cooperative throughout examination. Appears older than age. Vital signs reviewed. Lungs:  Normal respiratory effort, chest expands symmetrically. Lungs are clear to auscultation, no crackles or wheezes. Heart:  Irr irr vs reg with ectopy. Pulses:  R posterior tibial normal, R dorsalis pedis normal, L posterior tibial normal, and L dorsalis pedis normal.   Extremities:  No edema. Psych:  Oriented X3, memory intact for recent and remote, normally interactive, good eye contact, and not anxious appearing.     Impression & Recommendations:  Problem # 1:  DIABETES MELLITUS, II, COMPLICATIONS (A999333) Assessment Improved Discussed importance of checking BG while on insulin. Patient to start checking BG two times a day.  His updated medication list for this problem includes:    Metformin Hcl 1000 Mg Tabs (Metformin hcl) .Marland Kitchen... 1 tablet twice a day for diabetes    Novolin 70/30 70-30 % Susp (Insulin isophane & regular) .Marland KitchenMarland KitchenMarland KitchenMarland Kitchen 50 units in the am and 10 units in the pm    Adult Aspirin Ec Low Strength 81 Mg Tbec (Aspirin) .Marland Kitchen... 1 tablet by mouth daily    Losartan Potassium 50 Mg Tabs (Losartan potassium) ..... One by mouth q day  Orders: A1C-[facility] [id number]) [place]- Est  Level 4 [id number])  Problem # 2:  HYPERTENSION, BENIGN SYSTEMIC  (ICD-401.1) Assessment: Deteriorated  Patient states that he did not take meds this am. Instructed him to check his BP at home and to call with the numbers. His updated medication list for this problem includes:    Metoprolol Tartrate 100 Mg Tabs (Metoprolol tartrate) .Marland Kitchen... 1 tablet by mouth two times a day for blood pressure    Lasix 20 Mg Tabs (Furosemide) .Marland Kitchen... Take 1 by mouth daily    Losartan Potassium 50 Mg Tabs (Losartan potassium) ..... One by mouth q day  Orders: [place]- Est  Level 4 [id number])  Problem # 3:  IRREGULAR HEART RATE (ICD-427.9) Assessment: New  EKG obtained (see scanned). No Afib. + PACs. No new abnormalities. His updated medication list for this problem includes:    Metoprolol Tartrate 100 Mg Tabs (Metoprolol tartrate) .Marland Kitchen... 1 tablet by mouth two times a day for blood pressure    Adult Aspirin Ec Low Strength 81  Mg Tbec (Aspirin) .Marland Kitchen... 1 tablet by mouth daily  Orders: [facility]- Est  Level 4 [id number])  Complete Medication List: 1)  Isosorbide Mononitrate Cr 60 Mg Tb24 (Isosorbide mononitrate) .... Take 1 tablet by mouth once a day for blood pressure 2)  Metformin Hcl 1000 Mg Tabs (Metformin hcl) .Marland Kitchen.. 1 tablet twice a day for diabetes 3)  Paroxetine Hcl 20 Mg Tabs (Paroxetine hcl) .... One and one-half tabs by mouth daily 4)  Prevacid 30 Mg Cpdr (Lansoprazole) .... Take 1 capsule by mouth once a day for stomach 5)  Novolin 70/30 70-30 % Susp (Insulin isophane & regular) .... 50 units in the am and 10 units in the pm 6)  Metoprolol Tartrate 100 Mg Tabs (Metoprolol tartrate) .Marland Kitchen.. 1 tablet by mouth two times a day for blood pressure 7)  Fish Oil Concentrate 1000 Mg Caps (Omega-3 fatty acids) .Marland Kitchen.. 1 tablet daily 8)  Adult Aspirin Ec Low Strength 81 Mg Tbec (Aspirin) .Marland Kitchen.. 1 tablet by mouth daily 9)  Nitrostat 0.4 Mg Subl (Nitroglycerin) .Marland Kitchen.. 1 tab by mouth sl as needed chest pain 10)  Multivitamins Tabs (Multiple vitamin) .... Take 1 once daily 11)  Oxycontin 10 Mg Xr12h-tab  (Oxycodone hcl) .Marland Kitchen.. 1 tablet by mouth three times a day- prescribed by pain doctor 12)  Simvastatin 10 Mg Tabs (Simvastatin) .Marland Kitchen.. 1 at bedtime 13)  Lasix 20 Mg Tabs (Furosemide) .... Take 1 by mouth daily 14)  Losartan Potassium 50 Mg Tabs (Losartan potassium) .... One by mouth q day  Other Orders: 12 Lead EKG (12 Lead EKG)  Patient Instructions: 1)  It was nice to see you today! 2)  Follow up in 1 month.   Laboratory Results   Blood Tests   Date/Time Received: July 12, 2009 9:13 AM  Date/Time Reported: July 12, 2009 9:22 AM   HGBA1C: 7.6%   (Normal Range: Non-Diabetic - 3-6%   Control Diabetic - 6-8%)  Comments: ...........test performed by............Marland KitchenBAJordan, MT(ASCP)9:23 AM     Prevention & Chronic Care Immunizations   Influenza vaccine: Fluvax Non-MCR  (03/30/2009)   Influenza vaccine due: 03/08/2008    Tetanus booster: 11/24/2001: Done.   Tetanus booster due: 11/25/2011    Pneumococcal vaccine: Done.  (03/27/2004)   Pneumococcal vaccine due: None  Colorectal Screening   Hemoccult: Done.  (07/26/2002)   Hemoccult due: Not Indicated    Colonoscopy: Done.  (10/26/2002)   Colonoscopy due: 10/25/2012  Other Screening   PSA: 0.52  (03/09/2007)   PSA action/deferral: Discussion deferred  (07/12/2009)   PSA due due: 03/08/2008   Smoking status: current  (07/12/2009)   Smoking cessation counseling: yes  (06/10/2008)  Diabetes Mellitus   HgbA1C: 7.6  (07/12/2009)   Hemoglobin A1C due: 12/23/2007    Eye exam: Not documented    Foot exam: yes  (11/07/2008)   Foot exam action/deferral: Not indicated   High risk foot: Not documented   Foot care education: Not documented   Foot exam due: 02/23/2009    Urine microalbumin/creatinine ratio: Not documented   Urine microalbumin action/deferral: Not indicated    Diabetes flowsheet reviewed?: Yes   Progress toward A1C goal: Improved  Lipids   Total Cholesterol: 110  (10/20/2008)   LDL: 59   (10/20/2008)   LDL Direct: Not documented   HDL: 40  (10/20/2008)   Triglycerides: 53  (10/20/2008)    SGOT (AST): 18  (10/20/2008)   SGPT (ALT): 20  (10/20/2008)   Alkaline phosphatase: 103  (10/20/2008)   Total bilirubin: 0.8  (  10/20/2008)    Lipid flowsheet reviewed?: Yes   Progress toward LDL goal: At goal  Hypertension   Last Blood Pressure: 169 / 89  (07/12/2009)   Serum creatinine: 0.95  (10/20/2008)   Serum potassium 3.9  (10/20/2008)    Hypertension flowsheet reviewed?: Yes   Progress toward BP goal: Unchanged  Self-Management Support :   Personal Goals (by the next clinic visit) :     Personal A1C goal: 7  (03/30/2009)     Personal blood pressure goal: 130/80  (01/10/2009)     Personal LDL goal: 70  (03/30/2009)    Patient will work on the following items until the next clinic visit to reach self-care goals:     Medications and monitoring: take my medicines every day, bring all of my medications to every visit  (07/12/2009)     Eating: drink diet soda or water instead of juice or soda, eat more vegetables, use fresh or frozen vegetables, eat foods that are low in salt, eat baked foods instead of fried foods, eat fruit for snacks and desserts, limit or avoid alcohol  (07/12/2009)     Activity: take a 30 minute walk every day, take the stairs instead of the elevator, park at the far end of the parking lot  (07/12/2009)    Diabetes self-management support: CBG self-monitoring log, Written self-care plan  (07/12/2009)   Diabetes care plan printed    Hypertension self-management support: Written self-care plan  (07/12/2009)   Hypertension self-care plan printed.    Lipid self-management support: Written self-care plan  (07/12/2009)   Lipid self-care plan printed.

## 2010-06-26 NOTE — Progress Notes (Signed)
Summary: Januvia samples  Phone Note Call from Patient Call back at [facility] Phone [phone]   Summary of Call: Pt is requesting januvia 100mg  samples. Initial call taken by: Drucie Ip,  April 02, 2007 11:44 AM  Follow-up for Phone Call        Phone call to pt- advised that we do have samples of Januvia 100 mg.  Set aside 28 tabs of samples for pt to pick up. Follow-up by: AMY MARTIN RN,  April 02, 2007 4:14 PM

## 2010-06-26 NOTE — Progress Notes (Signed)
Summary: triage  Phone Note Call from Patient   Summary of Call: pt had chest discomfort and SOB last night, went to sleep in his truck, today he is very sleepy Initial call taken by: Audie Clear,  August 04, 2008 12:00 PM  Follow-up for Phone Call        has been out of diabetes meds x 4 days. no CP now. just very sleepy. fell asleep in is truck last night. had SOB & sat there to rest & fell asleep. has had tightness & pain in chest before. sleepiness started when he ran out of the dm meds.  I had already refilled them this am. He does not check his cbgs. to be here by 1:30pm today for a work in appt Follow-up by: Elige Radon RN,  August 04, 2008 12:00 PM  Additional Follow-up for Phone Call Additional follow up Details #1::        noted.  pt followed by [facility] [place] cardiology and has stable angina at baseline.  Stress test August 2009 was normal.  Not sure why he is so sleepy- don't really think hyperglycemia can fully explain symptoms.  Good idea for him to come to clinic today.   Additional Follow-up by: [name] MD,  August 04, 2008 1:21 PM

## 2010-06-26 NOTE — Assessment & Plan Note (Signed)
Summary: elbow pain wp   Vital Signs:  Patient Profile:   60 Years Old Male Weight:      231.8 pounds Pulse rate:   63 / minute BP sitting:   130 / 73  (right arm)  Pt. in pain?   yes    Location:   right elbow    Intensity:   7  Vitals Entered By: Mauricia Area CMA, (June 18, 2007 3:22 PM)              Is Patient Diabetic? Yes      Chief Complaint:  right elbow pain x 10 days.  History of Present Illness: bumped elbow few weeks ago on R elbow, then overtime, the area has gotten more sore and to the bone.  no numbness or tingling.  feels discomfort with opening and closing hands.  with active flexion and extension pain is reproduced.  pt is r handed.  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    Current Allergies: ENALAPRIL MALEATE (ENALAPRIL MALEATE)  Past Medical History:    Reviewed history from 01/07/2007 and no changes required:       Nonobstructive coronary disease- Cardiologist Dr. Verl Blalock [facility])          Nuclear study low risk - 09/25/2005 EF 59%          Stress cardiolyte: neg for ischemia - 04/17/2004,           Cath/PTCA: EF 65%; stenting of mid-circumflex artery - 02/24/2002,          Sestamibi/treadmill cardiolyte: neg; EF 56% - 06/28/1999,        HTN       hx of hypokalemia       hyperlipidemia          Carotid doppler: normal - 06/27/1998,        Type II DM with complications: Diabetic macular edema, nephropathy (proteinuria), retinopathy,        Disability claim 06/1998 after fall at work,        h/o EtOH withdrawal/seizures/pancreatitis in,        h/o gastric ulcer          Colonoscopy: colon polyps (benign) - 10/26/2002,           Upper GI neg 2000,           Hpylori neg 2000              allergic rhinitis,        presbyopia, hyperopia,        hx of sciatica  Past Surgical History:    Reviewed history from 01/07/2007 and no changes required:       Cath/PTCA: EF 65%; stenting of mid-circumflex artery -  02/24/2002       Epidural injection for back pain - 01/25/2001,    Family History:    Reviewed history from 01/07/2007 and no changes required:       6 siblings: DM, HTN, gout, migraines,        Breast CA 1st degree,        Diabetes 1st degree,        Father: CHF, dementia,        Mother: DM, HTN, CVA, breast CA,        neg for CAD  Social History:    Reviewed history from 07/24/2006 and no changes required:       -Quit smoking in spring  of 2004; -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[employer], Biomedical scientist ; -No alcohol currently (h/o heavy use--sober since [id number]); -Denies illicit drug use; -Married to [employer]; -Has 2 biological children & 2 step-children   Risk Factors:  Tobacco use:  current    Physical Exam  General:     Well-developed,well-nourished,in no acute distress; alert,appropriate and cooperative throughout examination   Shoulder/Elbow Exam  Elbow Exam:    Right:    Inspection:  Normal    Palpation:  Abnormal       Location:  right lateral epicondyle    Stability:  stable    Tenderness:  right lateral epicondyle    Swelling:  right lateral epicondyle    Erythema:  no    Range of Motion:       Flexion-Active: 90       Flexion-Passive: 100       Extension-Passive: 90       Elbow Flexion: full       Pain with varus stress of elbow: no       Pain with valgus stress of elbow: no    Impression & Recommendations:  Problem # 1:  ELBOW PAIN (ICD-719.42) hx, exam, and physical findings consistent with lateral epicondilytis.  will treat with pneumatic arm bad, celebrex for inflammation and voltaren gel for pain and inflammation.  pt to begin rehab exercises 1-2 weeks after using band, and antiinflammatories.  pt given handouts on condition and rehab exercercises.  follow up in 1-2 months.  Orders: Pneumatic arm band- [facility] [id number]) [place]- Est Level  3 [id number])   Problem # 2:  DIABETES MELLITUS, II, COMPLICATIONS (A999333) hbA1c slightly  elevated at 7.4 today.  pt ate alot of sweets during holiday season.  will follow up at next visit since today was an acute visit.  His updated medication list for this problem includes:    Hyzaar 100-25 Mg Tabs (Losartan potassium-hctz) .Marland Kitchen... Take 1 tablet by mouth once a day    Metformin Hcl 1000 Mg Tabs (Metformin hcl) .Marland Kitchen... 1 tablet twice a day for diabetes    Relion 70/30 70-30 % Susp (Insulin isophane & regular) ..... Inject 60 unit subcutaneously    Aspirin 325 Mg Tabs (Aspirin) .Marland Kitchen... 1 tablet a day    Januvia 100 Mg Tabs (Sitagliptin phosphate) .Marland Kitchen... 1 tablet a day  Orders: A1C-[facility] [id number]) [place]- Est Level  3 [id number])   Complete Medication List: 1)  Hyzaar 100-25 Mg Tabs (Losartan potassium-hctz) .... Take 1 tablet by mouth once a day 2)  Isosorbide Mononitrate Cr 60 Mg Tb24 (Isosorbide mononitrate) .... Take 1 tablet by mouth once a day 3)  Lipitor 10 Mg Tabs (Atorvastatin calcium) .... Take 1 tablet by mouth every night 4)  Metformin Hcl 1000 Mg Tabs (Metformin hcl) .Marland Kitchen.. 1 tablet twice a day for diabetes 5)  Norvasc 10 Mg Tabs (Amlodipine besylate) .... Take 1/2  tablet by mouth once a day 6)  Paxil 30 Mg Tabs (Paroxetine hcl) .... Take 1 tablet by mouth once a day 7)  Prevacid 30 Mg Cpdr (Lansoprazole) .... Take 1 capsule by mouth once a day 8)  Relion 70/30 70-30 % Susp (Insulin isophane & regular) .... Inject 60 unit subcutaneously 9)  Toprol Xl 200 Mg Tb24 (Metoprolol succinate) .Marland Kitchen.. 1 tablet one time a day for blood pressure 10)  Bl Vitamin E Natural 400 Unit Caps (Vitamin e) .Marland Kitchen.. 1 tablet a day 11)  Vitamin C  .... 1 tablet a  day 12)  Fish Oil Concentrate 1000 Mg Caps (Omega-3 fatty acids) .Marland Kitchen.. 1 tablet daily 13)  Alba-lybe Tabs (B complex vitamins) .Marland Kitchen.. 1 tablet a day 14)  Aspirin 325 Mg Tabs (Aspirin) .Marland Kitchen.. 1 tablet a day 15)  Spironolactone 25 Mg Tabs (Spironolactone) .Marland Kitchen.. 1 tablet 1 time a day for blood pressure and to raise potassium 16)  Januvia 100 Mg Tabs (Sitagliptin  phosphate) .Marland Kitchen.. 1 tablet a day 17)  Voltaren 1 % Gel (Diclofenac sodium) .... Two times a day  to qid as needed to elbow for pain. 18)  Celebrex 200 Mg Caps (Celecoxib) .Marland Kitchen.. 1 tablet a day for inflammation.   Patient Instructions: 1)  Please schedule a follow-up appointment in 1-2  month to check on elbow and diabetes.     Prescriptions: CELEBREX 200 MG  CAPS (CELECOXIB) 1 tablet a day for inflammation.  #30 x 1   Entered and Authorized by:   Marzetta Merino  MD   Signed by:   Marzetta Merino  MD on 06/18/2007   Method used:   Electronically sent to ...       [facility]  [address] [phone]*       [address] [place]       [place], [place]  [address]       Ph: [phone] or [phone]       Fax: [phone]   RxID:   [id number] VOLTAREN 1 %  GEL (DICLOFENAC SODIUM) two times a day  to qid as needed to elbow for pain.  #1 x 2   Entered and Authorized by:   Marzetta Merino  MD   Signed by:   Marzetta Merino  MD on 06/18/2007   Method used:   Electronically sent to ...       [facility]  [address] [phone]*       [place]       [place], [place]  [address]       Ph: [phone] or [phone]       Fax: [phone]   RxID:   [id number]  ] Laboratory Results   Blood Tests   Date/Time Received: June 18, 2007 3:25  PM  Date/Time Reported: June 18, 2007 3:42 PM   HGBA1C: 7.4%   (Normal Range: Non-Diabetic - 3-6%   Control Diabetic - 6-8%)  Comments: ...............test performed by......Marland KitchenBonnie A. Martin[place], [place] (ASCP)    4

## 2010-06-26 NOTE — Assessment & Plan Note (Signed)
Summary: rash bmc   Primary Care Provider:  Briscoe Deutscher DO  CC:  RASH.  History of Present Illness: 60 yo M, with Hx DM:  1. Rash on Penis: started several days ago, with white discharge from area of tip of penis and foreskin, started using OTC Clotrimazole, now unable to retract forekin without skin tearing, no issues with urination.  2. Face Swelling: x 1-2 days, started with abscess in mouth 2/2 "bad teeth," spread to eye which is now swollen. denies HA, dizziness, eye pain, vision changes, inability to move eye, sore throat, N/V/D. endorses mouth pain. Tm 100.1 today.  Current Medications (verified): 1)  Isosorbide Mononitrate Cr 60 Mg Tb24 (Isosorbide Mononitrate) .... Take 1 Tablet By Mouth Once A Day For Blood Pressure 2)  Metformin Hcl 1000 Mg Tabs (Metformin Hcl) .Marland Kitchen.. 1 Tablet Twice A Day For Diabetes 3)  Paroxetine Hcl 20 Mg  Tabs (Paroxetine Hcl) .... One and One-Half Tabs By Mouth Daily 4)  Prevacid 30 Mg Cpdr (Lansoprazole) .... Take 1 Capsule By Mouth Once A Day For Stomach 5)  Novolin 70/30 70-30 % Susp (Insulin Isophane & Regular) .... 50 Units in The Am and 10 Units in The Pm 6)  Metoprolol Tartrate 100 Mg Tabs (Metoprolol Tartrate) .Marland Kitchen.. 1 Tablet By Mouth Two Times A Day For Blood Pressure 7)  Fish Oil Concentrate 1000 Mg  Caps (Omega-3 Fatty Acids) .Marland Kitchen.. 1 Tablet Daily 8)  Adult Aspirin Ec Low Strength 81 Mg Tbec (Aspirin) .Marland Kitchen.. 1 Tablet By Mouth Daily 9)  Nitrostat 0.4 Mg Subl (Nitroglycerin) .Marland Kitchen.. 1 Tab By Mouth Sl As Needed Chest Pain 10)  Multivitamins   Tabs (Multiple Vitamin) .... Take 1 Once Daily 11)  Oxycontin 10 Mg Xr12h-Tab (Oxycodone Hcl) .Marland Kitchen.. 1 Tablet By Mouth Three Times A Day- Prescribed By Pain Doctor 12)  Simvastatin 10 Mg Tabs (Simvastatin) .Marland Kitchen.. 1 At Bedtime 13)  Lasix 20 Mg Tabs (Furosemide) .... Take 2 By Mouth Daily 14)  Losartan Potassium 50 Mg Tabs (Losartan Potassium) .... One By Mouth Q Day 15)  Potassium Chloride Cr 10 Meq  Tbcr (Potassium  Chloride) .... 2 By Mouth Daily 16)  Hydrocodone-Acetaminophen 10-325 Mg Tabs (Hydrocodone-Acetaminophen) .Marland Kitchen.. 1 By Mouth Up To 4 Time Per Day As Needed For Pain 17)  Nystatin 100000 Unit/gm Crea (Nystatin) .... Apply To Penis Two Times A Day As Needed For Yeast 18)  Diflucan 150 Mg Tabs (Fluconazole) .... Once Daily X 1, Then Again 1 Week Later 19)  Augmentin 875-125 Mg Tabs (Amoxicillin-Pot Clavulanate) .Marland Kitchen.. 1 By Mouth 2 Times Daily  Allergies (verified): 1)  Enalapril Maleate (Enalapril Maleate) PMH-FH-SH reviewed for relevance  Review of Systems      See HPI  Physical Exam  General:  Well-developed, well-nourished, in no acute distress; alert, appropriate and cooperative throughout examination. Vitals reviewed. Head:  Left facial edema from cheek to eye. No lesion on external skin. Eyes:  No corneal or conjunctival inflammation noted. EOMI. Perrla. Vision grossly normal. No proptosis. Ears:  R ear normal and L ear normal.   Mouth:  Very poor dentition. Left molar abscess with no drainage. Genitalia:  Uncircumcised, unable to retract foreskin, forskin with white discharge and 3 ulcerated areas (patient stated that they were "skin tears" from previous efforts to retract the foreskin. Psych:  Oriented X3, memory intact for recent and remote, normally interactive, and good eye contact.     Impression & Recommendations:  Problem # 1:  BALANOPOSTHITIS (ICD-607.1) Assessment New  Patient able to  urinate without difficulty. Rx Diflucan and Nystatin. RED FLAGs given for prompt evaluation in the ED so that he will have access to a Urologist.  Orders: [place]- Est  Level 4 (99214)  Problem # 2:  CELLULITIS AND ABSCESS OF FACE (ICD-682.0) Assessment: New  Orders: Dental Referral (Dentist) CBC w/Diff-[facility] [id number]) [place]- Est  Level 4 [id number])  His updated medication list for this problem includes:    Augmentin 875-125 Mg Tabs (Amoxicillin-pot clavulanate) .Marland Kitchen... 1 by mouth 2 times  daily  Problem # 3:  PERIORBITAL CELLULITIS (ICD-376.01) Assessment: New  NO RED FLAGS. See #2.  Orders: [place]- Est  Level 4 (99214)  Problem # 4:  DENTAL CARIES (ICD-521.00) Assessment: Deteriorated  Orders: Dental Referral (Dentist) [place]- Est  Level 4 [id number])  Problem # 5:  DIABETES MELLITUS, II, COMPLICATIONS (A999333) Assessment: Deteriorated Normally controlled. Likely 2/2 infection. Patient to follow up s/p infection for discussion of DM and adjustment of regimen. His updated medication list for this problem includes:    Metformin Hcl 1000 Mg Tabs (Metformin hcl) .Marland Kitchen... 1 tablet twice a day for diabetes    Novolin 70/30 70-30 % Susp (Insulin isophane & regular) .Marland KitchenMarland KitchenMarland KitchenMarland Kitchen 50 units in the am and 10 units in the pm    Adult Aspirin Ec Low Strength 81 Mg Tbec (Aspirin) .Marland Kitchen... 1 tablet by mouth daily    Losartan Potassium 50 Mg Tabs (Losartan potassium) ..... One by mouth q day  Orders: [facility] [id number])  Complete Medication List: 1)  Isosorbide Mononitrate Cr 60 Mg Tb24 (Isosorbide mononitrate) .... Take 1 tablet by mouth once a day for blood pressure 2)  Metformin Hcl 1000 Mg Tabs (Metformin hcl) .Marland Kitchen.. 1 tablet twice a day for diabetes 3)  Paroxetine Hcl 20 Mg Tabs (Paroxetine hcl) .... One and one-half tabs by mouth daily 4)  Prevacid 30 Mg Cpdr (Lansoprazole) .... Take 1 capsule by mouth once a day for stomach 5)  Novolin 70/30 70-30 % Susp (Insulin isophane & regular) .... 50 units in the am and 10 units in the pm 6)  Metoprolol Tartrate 100 Mg Tabs (Metoprolol tartrate) .Marland Kitchen.. 1 tablet by mouth two times a day for blood pressure 7)  Fish Oil Concentrate 1000 Mg Caps (Omega-3 fatty acids) .Marland Kitchen.. 1 tablet daily 8)  Adult Aspirin Ec Low Strength 81 Mg Tbec (Aspirin) .Marland Kitchen.. 1 tablet by mouth daily 9)  Nitrostat 0.4 Mg Subl (Nitroglycerin) .Marland Kitchen.. 1 tab by mouth sl as needed chest pain 10)  Multivitamins Tabs (Multiple vitamin) .... Take 1 once daily 11)  Oxycontin 10 Mg Xr12h-tab (Oxycodone  hcl) .Marland Kitchen.. 1 tablet by mouth three times a day- prescribed by pain doctor 12)  Simvastatin 10 Mg Tabs (Simvastatin) .Marland Kitchen.. 1 at bedtime 13)  Lasix 20 Mg Tabs (Furosemide) .... Take 2 by mouth daily 14)  Losartan Potassium 50 Mg Tabs (Losartan potassium) .... One by mouth q day 15)  Potassium Chloride Cr 10 Meq Tbcr (Potassium chloride) .... 2 by mouth daily 16)  Hydrocodone-acetaminophen 10-325 Mg Tabs (Hydrocodone-acetaminophen) .Marland Kitchen.. 1 by mouth up to 4 time per day as needed for pain 17)  Nystatin 100000 Unit/gm Crea (Nystatin) .... Apply to penis two times a day as needed for yeast 18)  Diflucan 150 Mg Tabs (Fluconazole) .... Once daily x 1, then again 1 week later 19)  Augmentin 875-125 Mg Tabs (Amoxicillin-pot clavulanate) .Marland Kitchen.. 1 by mouth 2 times daily  Other Orders: [facility] [id number])  Patient Instructions: 1)  Follow up early next week if you are not  improving. 2)  We will try to help to get you to a dentist/surgeon. Prescriptions: AUGMENTIN 875-125 MG TABS (AMOXICILLIN-POT CLAVULANATE) 1 by mouth 2 times daily  #20 x 3   Entered and Authorized by:   Briscoe Deutscher DO   Signed by:   Briscoe Deutscher DO on 02/08/2010   Method used:   Print then Give to Patient   RxID:   [id number] DIFLUCAN 150 MG TABS (FLUCONAZOLE) once daily x 1, then again 1 week later  #2 x 0   Entered and Authorized by:   Briscoe Deutscher DO   Signed by:   Briscoe Deutscher DO on 02/08/2010   Method used:   Print then Give to Patient   RxID:   [id number] [place] 100000 UNIT/GM CREA (NYSTATIN) apply to penis two times a day as needed for yeast  #1 x 0   Entered and Authorized by:   Briscoe Deutscher DO   Signed by:   Briscoe Deutscher DO on 02/08/2010   Method used:   Print then Give to Patient   RxID:   [id number]   Laboratory Results   Blood Tests   Date/Time Received: February 08, 2010 9:57 AM  Date/Time Reported: February 08, 2010 10:17 AM   HGBA1C: 11.3%   (Normal Range: Non-Diabetic -  3-6%   Control Diabetic - 6-8%)  Comments: ...........test performed by...........Marland KitchenHedy Camara, CMA

## 2010-06-26 NOTE — Assessment & Plan Note (Signed)
Primary Provider:  [name] MD  CC:  chest pain 2 days ago.  History of Present Illness: 60 yo AAM with h/o DM, HTN, hyperlipidemia and known CAD with placement of Cyper drug eluting stent in the Circumflex in October 2003 who is followed in this office by Dr. Mar Daring and comes in as an add on to be seen after a recent episode of chest discomfort while driving. The patient tells me that he has been in his normal state of good health until  2 nights ago. He worked a long day and was tired. He had been out of his insulin for 3 days. The pain was substernal with radiation into the left arm with associated diaphoresis and mild SOB. Lasted 5 minutes. He is out of NTG so he did not take any. The pain subsided without intervention. He has had no recurrence over the last 36 hours. He was seen by his primary care physician yesterday and arrangements were made for him to be seen in our office today.   He tells me that he is feeling great today. He has had not other episodes of chest pain over the last year. He is very active and is not limited by fatigue or dyspnea. He denies any dizziness, near syncope, syncope, palpitations, PND, orthopnea, LE edema.  He had a normal adenosine myoview in Augst 2009 with normal EF and no obvious ischemia.  Allergies: 1)  Enalapril Maleate (Enalapril Maleate)  Past Medical History:    Reviewed history from 04/15/2008 and no changes required:       Nonobstructive coronary disease- Cardiologist Dr. Verl Blalock [facility])          Nuclear study low risk - 09/25/2005 EF 59%          Stress cardiolyte: neg for ischemia - 04/17/2004,           Cath/PTCA: EF 65%; drug eluting stent of mid-circumflex artery - 02/24/2002,          Sestamibi/treadmill cardiolyte: neg; EF 56% - 06/28/1999          Nuclear stress test negative for inschemia- 8/09       HTN         hx of hypokalemia       hyperlipidemia       Carotid doppler: normal - 06/27/1998        Type II DM with complications:  Diabetic macular edema, nephropathy (proteinuria), retinopathy       h/o EtOH withdrawal/seizures/pancreatitis       h/o gastric ulcer              Colonoscopy: colon polyps (benign) - 10/26/2002          Upper GI neg 2000          Hpylori neg 2000       presbyopia, hyperopia  Past Surgical History:    Reviewed history from 12/18/2007 and no changes required:       Cath/PTCA: EF 65%; stenting of mid-circumflex artery - 02/24/2002       Epidural injection for back pain - 01/25/2001  Family History:    Reviewed history from 11/18/2007 and no changes required:       6 siblings: DM, HTN, gout, migraines,        Breast CA 1st degree,        Diabetes 1st degree,        Father: CHF, dementia,        Mother: DM, HTN,  CVA, breast CA,          neg for CAD       Social History:    Reviewed history from 12/18/2007 and no changes required:       Employed at two part time jobs - Therapist, 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  Quiting smoking currently.      Review of Systems       The patient complains of chest pain.  The patient denies anorexia, fever, weight loss, weight gain, vision loss, decreased hearing, hoarseness, syncope, dyspnea on exertion, peripheral edema, prolonged cough, headaches, hemoptysis, abdominal pain, melena, hematochezia, severe indigestion/heartburn, hematuria, muscle weakness, difficulty walking, and depression.    Vital Signs:  Patient profile:   60 year old male Weight:      226 pounds Pulse rate:   61 / minute BP sitting:   134 / 69  (left arm)  Vitals Entered By: Eliezer Lofts, EMT-P (August 05, 2008 9:56 AM)  Physical Exam  General:  General: Well developed, well nourished, NAD HEENT: OP clear, mucus membranes moist SKIN: warm, dry Neuro: No focal deficits Musculoskeletal: Muscle strength 5/5 all  ext Psychiatric: Mood and affect normal Neck: No JVD, no carotid bruits, no thyromegaly, no lymphadenopathy. Lungs:Clear bilaterally, no wheezes, rhonci, crackles CV: RRR no murmurs, gallops rubs Abdomen: soft, NT, ND, BS present Extremities: No edema, pulses 2+.    EKG  Procedure date:  08/05/2008  Findings:      NSR 61 bpm. Nonspecific T wave changes. (unchanged from EKG 8/09)  Impression & Recommendations:  Problem # 1:  CORONARY, ARTERIOSCLEROSIS (ICD-414.00) I do not think this isolated episode of chest pain represents unstable angina.  The EKG is unchanged. He has had no exertional angina. His BP is reasonably well controlled. He is on a good medication regimen. Recent ischemic evaluation normal 8/09. I do not think that any further ischemia w/u is necessary at this time. The patient does have known CAD with prior placement of a DES in the Circumflex artery in 2003. If he has any recurrence of his chest pain, I think it would be best to look for ischemia, probably with a left heart catheterization. We will continue current meds and have him f/u with Dr. Verl Blalock in 4 weeks. He is aware that he should let us know if he has any changes in his clinical status. He should also seek immediate medical attention if he has has recurrence of severe substernal chest pressure.   His updated medication list for this problem includes:    Isosorbide Mononitrate Cr 60 Mg Tb24 (Isosorbide mononitrate) .Marland Kitchen... Take 1 tablet by mouth once a day for blood pressure    Norvasc 10 Mg Tabs (Amlodipine besylate) .Marland Kitchen... Take 1/2  tablet by mouth once a day for blood pressure    Toprol Xl 100 Mg Tb24 (Metoprolol succinate) .Marland Kitchen... 1 by mouth daily for blood pressure    Aspirin 325 Mg Tabs (Aspirin) .Marland Kitchen... 1 tablet a day    Nitrostat 0.4 Mg Subl (Nitroglycerin) .Marland Kitchen... 1 tab by mouth sl as needed chest pain  Patient Instructions: 1)  Your physician recommends that you schedule a follow-up appointment in: 4 weeks with  Dr. Verl Blalock.

## 2010-06-26 NOTE — [facility] Notes (Signed)
Summary: Rx Req  Phone Note Refill Request Call back at Home Phone [phone] Message from:  Patient  Refills Requested: Medication #1:  PAXIL 30 MG TABS Take 1 tablet by mouth once a day for depression   Notes: pt is out  Medication #2:  METOPROLOL TARTRATE 100 MG TABS 1 tablet by mouth two times a day for blood pressure   Notes: pt is out  Medication #3:  RELION 70/30 70-30 % SUSP Inject 50 unit subcutaneously in AM and 10 in PM.  disp qs for 3 months  Medication #4:  HYDROCHLOROTHIAZIDE 25 MG TABS 1 tablet by mouth daily for blood pressure ISOSORBIDE MONONITRATE, SIMVASTATIN, METFORMIN HCL, NORVASC (CAN THESE BE DONE IN 90 DAYS SUPPLY) WOULD LIKE THESE HAND WRITTEN CAN HE PLEASE COME BY AND PICK THESE UP WHEN READY.  Initial call taken by: Anthony [name],  November 21, 2008 10:55 AM  Follow-up for Phone Call        walked in needing meds written. sad affect. has been out of Paxil & Metoprolol for 4 days. needs others filled but not yet out.  has no money since his hours were cut in half. states he may be able to get one next week.  message to pcp who will handle tomorrow.  Dr. Nori Riis wrote rx for the 2 he was out of that can be filled at [facility] for $4 each. gave him $8 cash to go get them today from our [employer].  gave him a receipt to have the pharmacy sign & fax back here to prove he used the money for the meds & to track our disbursements. he has only 1/2 bottle of insulin left. we do not have samples of his kind. told him I will ask md to see if his insulin can be changed to a brand we do have samples of.  also needs to have all rx written that are on [facility] $4 plan. He did the prescreening with [facility] while he was here. his appt is not until 01/11/09. told him we will try to get him what he needs until they can start filling his rxs. he is eligible for more cash from indigent fund to cover $4 meds Follow-up by: Elige Radon RN,  November 23, 2008 3:47 PM  Additional Follow-up for Phone  Call Additional follow up Details #1::        fax rec'd back from [facility] Additional Follow-up by: Elige Radon RN,  November 23, 2008 4:35 PM    Additional Follow-up for Phone Call Additional follow up Details #2::    rec'd notice from [facility]. Paxil is not on the $4 list. can this be changed? form in pcp chart box Follow-up by: Elige Radon RN,  November 24, 2008 11:19 AM  New/Updated Medications: PAROXETINE HCL 20 MG  TABS (PAROXETINE HCL) one and one-half tabs by mouth daily   Prescriptions: HYDROCHLOROTHIAZIDE 25 MG TABS (HYDROCHLOROTHIAZIDE) 1 tablet by mouth daily for blood pressure  #90 x 3   Entered by:   Briscoe Deutscher MD   Authorized by:   Dorcas Mcmurray MD   Signed by:   Briscoe Deutscher MD on 11/24/2008   Method used:   Print then Give to Patient   RxID:   [id number] NORVASC 10 MG TABS (AMLODIPINE BESYLATE) Take 1/2  tablet by mouth once a day for blood pressure  #90 x 3   Entered by:   Briscoe Deutscher MD   Authorized by:   Dorcas Mcmurray MD  Signed by:   Briscoe Deutscher MD on 11/24/2008   Method used:   Print then Give to Patient   RxID:   [id number] METFORMIN HCL 1000 MG TABS (METFORMIN HCL) 1 tablet twice a day for diabetes  #60 x 3   Entered by:   Briscoe Deutscher MD   Authorized by:   Dorcas Mcmurray MD   Signed by:   Briscoe Deutscher MD on 11/24/2008   Method used:   Print then Give to Patient   RxID:   [id number] SIMVASTATIN 20 MG TABS (SIMVASTATIN) 1 tab at bedtime  #90 x 3   Entered by:   Briscoe Deutscher MD   Authorized by:   Dorcas Mcmurray MD   Signed by:   Briscoe Deutscher MD on 11/24/2008   Method used:   Print then Give to Patient   RxID:   [id number] ISOSORBIDE MONONITRATE CR 60 MG TB24 (ISOSORBIDE MONONITRATE) Take 1 tablet by mouth once a day for blood pressure  #90 x 3   Entered by:   Briscoe Deutscher MD   Authorized by:   Dorcas Mcmurray MD   Signed by:   Briscoe Deutscher MD on 11/24/2008   Method used:   Print then Give to Patient   RxID:   [id number] PAROXETINE  HCL 20 MG  TABS (PAROXETINE HCL) one and one-half tabs by mouth daily  #90 x 3   Entered by:   Briscoe Deutscher MD   Authorized by:   Dorcas Mcmurray MD   Signed by:   Briscoe Deutscher MD on 11/24/2008   Method used:   Print then Give to Patient   [id number] METOPROLOL TARTRATE 100 MG TABS (METOPROLOL TARTRATE) 1 tablet by mouth two times a day for blood pressure  #60 x 6   Entered and Authorized by:   Dorcas Mcmurray MD   Signed by:   Dorcas Mcmurray MD on 11/23/2008   Method used:   Print then Give to Patient   [id number] PAXIL 30 MG TABS (PAROXETINE HCL) Take 1 tablet by mouth once a day for depression  #30 x 6   Entered and Authorized by:   Dorcas Mcmurray MD   Signed by:   Dorcas Mcmurray MD on 11/23/2008   Method used:   Print then Give to Patient   [id number]  Paxil is on the $4 list at [facility] according to the 2010 list. I will Rx #90 for the $10 supply of Paroxetine 20- he can take 1 and 1/2 pills daily. I believe that this may be a better option than switching meds. I am also switching most of his meds to a 90 day supply, have printed them, and will put them in the "to be called" box. I will discuss the insulin change with Dr. Valentina Lucks, but the Relion insulin is the cheapest option.

## 2010-06-26 NOTE — Letter (Signed)
Summary: [facility]  eye exam 11/05/06 background retinopathy was detected, but only requires monitoring.  no treatment indicated.  repeat in 4 months.  [facility] [phone]  Dr. Faythe Casa.D.

## 2010-06-26 NOTE — Miscellaneous (Signed)
  Clinical Lists Changes he slept all day yesterday. appt for f/u tomorrow at [place]. he works 2nd shift & unable to come late in day.asked him to bring all med bottles.Elige Radon RN  September 06, 2009 9:34 AM

## 2010-06-26 NOTE — Assessment & Plan Note (Signed)
Summary: knot on leg,df   Vital Signs:  Patient profile:   60 year old male Height:      72 inches Weight:      211 pounds BMI:     28.72 Temp:     97.9 degrees F BP sitting:   160 / 88  (left arm)  Vitals Entered By: Schuyler Amor CMA (Oct 16, 2009 9:58 AM) CC: knot on right leg Is Patient Diabetic? Yes Pain Assessment Patient in pain? no        Primary Care Provider:  Briscoe Deutscher DO  CC:  knot on right leg.  History of Present Illness: 60 year old AAM:  1. Knot on Leg: Right, medial, has been there for several months, no pain, no injury recalled, no skin changes, no problem with foot/leg ROM, no edema, no other similar areas. He wants the knot to be evaluated today.   Habits & Providers  Alcohol-Tobacco-Diet     Tobacco Status: quit  Current Medications (verified): 1)  Isosorbide Mononitrate Cr 60 Mg Tb24 (Isosorbide Mononitrate) .... Take 1 Tablet By Mouth Once A Day For Blood Pressure 2)  Metformin Hcl 1000 Mg Tabs (Metformin Hcl) .Marland Kitchen.. 1 Tablet Twice A Day For Diabetes 3)  Paroxetine Hcl 20 Mg  Tabs (Paroxetine Hcl) .... One and One-Half Tabs By Mouth Daily 4)  Prevacid 30 Mg Cpdr (Lansoprazole) .... Take 1 Capsule By Mouth Once A Day For Stomach 5)  Novolin 70/30 70-30 % Susp (Insulin Isophane & Regular) .... 50 Units in The Am and 10 Units in The Pm 6)  Metoprolol Tartrate 100 Mg Tabs (Metoprolol Tartrate) .Marland Kitchen.. 1 Tablet By Mouth Two Times A Day For Blood Pressure 7)  Fish Oil Concentrate 1000 Mg  Caps (Omega-3 Fatty Acids) .Marland Kitchen.. 1 Tablet Daily 8)  Adult Aspirin Ec Low Strength 81 Mg Tbec (Aspirin) .Marland Kitchen.. 1 Tablet By Mouth Daily 9)  Nitrostat 0.4 Mg Subl (Nitroglycerin) .Marland Kitchen.. 1 Tab By Mouth Sl As Needed Chest Pain 10)  Multivitamins   Tabs (Multiple Vitamin) .... Take 1 Once Daily 11)  Oxycontin 10 Mg Xr12h-Tab (Oxycodone Hcl) .Marland Kitchen.. 1 Tablet By Mouth Three Times A Day- Prescribed By Pain Doctor 12)  Simvastatin 10 Mg Tabs (Simvastatin) .Marland Kitchen.. 1 At Bedtime 13)  Lasix  20 Mg Tabs (Furosemide) .... Take 2 By Mouth Daily 14)  Losartan Potassium 50 Mg Tabs (Losartan Potassium) .... One By Mouth Q Day 15)  Potassium Chloride Cr 10 Meq  Tbcr (Potassium Chloride) .... 2 By Mouth Daily 16)  Hydrocodone-Acetaminophen 10-325 Mg Tabs (Hydrocodone-Acetaminophen) .Marland Kitchen.. 1 By Mouth Up To 4 Time Per Day As Needed For Pain  Allergies (verified): 1)  Enalapril Maleate (Enalapril Maleate) PMH-FH-SH reviewed-no changes except otherwise noted  Review of Systems General:  Denies chills and fever. CV:  Denies chest pain or discomfort, palpitations, shortness of breath with exertion, and swelling of feet. Resp:  Denies chest pain with inspiration, cough, and shortness of breath. MS:  Denies joint pain, joint redness, joint swelling, and cramps. Derm:  Complains of lesion(s); denies changes in color of skin, itching, and rash.  Physical Exam  General:  Well-developed, well-nourished, in no acute distress; alert, appropriate and cooperative throughout examination. Vitals reviewed. Skin:  Right medial leg with 2-3 inch long by 1 inch wide area that is firm to palpation. It is nonmobile, nontender with no fluctuance, and no skin changes. Distal pulses are equally palpable. No edema. Right leg  ~ 1 cm larger in circumference to left leg  at 10 cm mark. No cords. No rash.  Psych:  Oriented X3, memory intact for recent and remote, normally interactive, good eye contact, and not anxious appearing.    Diabetes Management Exam:    Foot Exam (with socks and/or shoes not present):       Sensory-Pinprick/Light touch:          Left medial foot (L-4): normal          Left dorsal foot (L-5): normal          Left lateral foot (S-1): normal          Right medial foot (L-4): normal          Right dorsal foot (L-5): normal          Right lateral foot (S-1): normal       Sensory-Monofilament:          Left foot: normal          Right foot: normal       Inspection:          Left foot:  abnormal             Comments: Fungal Infxn          Right foot: abnormal             Comments: Fungal Infx       Nails:          Left foot: fungal infection          Right foot: fungal infection   Impression & Recommendations:  Problem # 1:  SKIN LESION (ICD-709.9) Assessment New  No red flags. Patient with no overlying skin changes, good distal pulses, no pain with palpation, no change to skin sensation. DDx includes previous injury, lipoma, vascular issue (though DVT unlikely). Discussed with patient and Dr. Lindell Noe, preceptor. Since patient has no Red Flags and he has much difficulty with affording medical care 2/2 poor insurance, will wait and watch closely for now. Red flags given for promptly seeking medical care: fever/chills, CP, SOB, palpitations, pain in the leg, fluctuance in the area or signs of infection, LE edema.  Orders: [place]- Est Level  3 [id number])  Problem # 2:  DERMATOPHYTOSIS OF FOOT (ICD-110.4) Assessment: New  Rx Lamasil. Discussed good foot hygiene.  Orders: [facility]- Est Level  3 [id number])  Complete Medication List: 1)  Isosorbide Mononitrate Cr 60 Mg Tb24 (Isosorbide mononitrate) .... Take 1 tablet by mouth once a day for blood pressure 2)  Metformin Hcl 1000 Mg Tabs (Metformin hcl) .Marland Kitchen.. 1 tablet twice a day for diabetes 3)  Paroxetine Hcl 20 Mg Tabs (Paroxetine hcl) .... One and one-half tabs by mouth daily 4)  Prevacid 30 Mg Cpdr (Lansoprazole) .... Take 1 capsule by mouth once a day for stomach 5)  Novolin 70/30 70-30 % Susp (Insulin isophane & regular) .... 50 units in the am and 10 units in the pm 6)  Metoprolol Tartrate 100 Mg Tabs (Metoprolol tartrate) .Marland Kitchen.. 1 tablet by mouth two times a day for blood pressure 7)  Fish Oil Concentrate 1000 Mg Caps (Omega-3 fatty acids) .Marland Kitchen.. 1 tablet daily 8)  Adult Aspirin Ec Low Strength 81 Mg Tbec (Aspirin) .Marland Kitchen.. 1 tablet by mouth daily 9)  Nitrostat 0.4 Mg Subl (Nitroglycerin) .Marland Kitchen.. 1 tab by mouth sl as needed chest pain 10)   Multivitamins Tabs (Multiple vitamin) .... Take 1 once daily 11)  Oxycontin 10 Mg Xr12h-tab (Oxycodone hcl) .Marland Kitchen.. 1 tablet by mouth three times  a day- prescribed by pain doctor 12)  Simvastatin 10 Mg Tabs (Simvastatin) .Marland Kitchen.. 1 at bedtime 13)  Lasix 20 Mg Tabs (Furosemide) .... Take 2 by mouth daily 14)  Losartan Potassium 50 Mg Tabs (Losartan potassium) .... One by mouth q day 15)  Potassium Chloride Cr 10 Meq Tbcr (Potassium chloride) .... 2 by mouth daily 16)  Hydrocodone-acetaminophen 10-325 Mg Tabs (Hydrocodone-acetaminophen) .Marland Kitchen.. 1 by mouth up to 4 time per day as needed for pain  Patient Instructions: 1)  Start Lamasil, which is over the counter, for your feet. 2)  If your leg becomes hot, swollen, or if you have chest pain or shortness of breath, please let me know right away or go to the emergency department.  Prevention & Chronic Care Immunizations   Influenza vaccine: Fluvax Non-MCR  (03/30/2009)   Influenza vaccine deferral: Not indicated  (10/16/2009)   Influenza vaccine due: 03/08/2008    Tetanus booster: 11/24/2001: Done.   Tetanus booster due: 11/25/2011    Pneumococcal vaccine: Done.  (03/27/2004)   Pneumococcal vaccine due: None  Colorectal Screening   Hemoccult: Done.  (07/26/2002)   Hemoccult due: Not Indicated    Colonoscopy: Done.  (10/26/2002)   Colonoscopy due: 10/25/2012  Other Screening   PSA: 0.52  (03/09/2007)   PSA action/deferral: Discussion deferred  (07/12/2009)   PSA due due: 03/08/2008   Smoking status: quit  (10/16/2009)  Diabetes Mellitus   HgbA1C: 7.6  (07/12/2009)   Hemoglobin A1C due: 12/23/2007    Eye exam: Not documented    Foot exam: yes  (10/16/2009)   Foot exam action/deferral: Not indicated   High risk foot: Not documented   Foot care education: Not documented   Foot exam due: 02/23/2009    Urine microalbumin/creatinine ratio: Not documented   Urine microalbumin action/deferral: Not indicated    Diabetes flowsheet  reviewed?: Yes   Progress toward A1C goal: At goal  Lipids   Total Cholesterol: 110  (10/20/2008)   LDL: 59  (10/20/2008)   LDL Direct: Not documented   HDL: 40  (10/20/2008)   Triglycerides: 53  (10/20/2008)    SGOT (AST): 20  (08/15/2009)   SGPT (ALT): 15  (08/15/2009)   Alkaline phosphatase: 85  (08/15/2009)   Total bilirubin: 0.5  (08/15/2009)    Lipid flowsheet reviewed?: Yes   Progress toward LDL goal: At goal  Hypertension   Last Blood Pressure: 160 / 88  (10/16/2009)   Serum creatinine: 0.98  (08/15/2009)   Serum potassium 3.7  (08/15/2009)    Hypertension flowsheet reviewed?: Yes   Progress toward BP goal: Deteriorated  Self-Management Support :   Personal Goals (by the next clinic visit) :     Personal A1C goal: 8  (08/15/2009)     Personal blood pressure goal: 130/80  (01/10/2009)     Personal LDL goal: 70  (03/30/2009)    Patient will work on the following items until the next clinic visit to reach self-care goals:     Medications and monitoring: take my medicines every day, bring all of my medications to every visit  (10/16/2009)     Eating: drink diet soda or water instead of juice or soda, eat more vegetables, use fresh or frozen vegetables, eat foods that are low in salt, eat baked foods instead of fried foods, eat fruit for snacks and desserts, limit or avoid alcohol  (10/16/2009)     Activity: take a 30 minute walk every day  (10/16/2009)    Diabetes self-management support: Written  self-care plan  (10/16/2009)   Diabetes care plan printed    Hypertension self-management support: Written self-care plan  (10/16/2009)   Hypertension self-care plan printed.    Lipid self-management support: Written self-care plan  (10/16/2009)   Lipid self-care plan printed.

## 2010-06-26 NOTE — Assessment & Plan Note (Signed)
Summary: feet swelling wp  Medications Added HYZAAR 100-25 MG TABS (LOSARTAN POTASSIUM-HCTZ) Take 1 tablet by mouth once a day ISOSORBIDE MONONITRATE CR 60 MG TB24 (ISOSORBIDE MONONITRATE) Take 1 tablet by mouth once a day LIPITOR 10 MG TABS (ATORVASTATIN CALCIUM) Take 1 tablet by mouth every night METFORMIN HCL 1000 MG TABS (METFORMIN HCL)  NORVASC 10 MG TABS (AMLODIPINE BESYLATE) Take 1 tablet by mouth once a day PAXIL 30 MG TABS (PAROXETINE HCL) Take 1 tablet by mouth once a day PREVACID 30 MG CPDR (LANSOPRAZOLE) Take 1 capsule by mouth once a day RELION 70/30 70-30 % SUSP (INSULIN ISOPHANE & REGULAR) Inject 60 unit subcutaneously TOPROL XL 100 MG TB24 (METOPROLOL SUCCINATE) Take 1 tablet by mouth twice a day      Allergies Added: ENALAPRIL MALEATE (ENALAPRIL MALEATE)  Vital Signs:  Patient Profile:   60 Years Old Male Weight:      232 pounds Pulse rate:   70 / minute BP sitting:   164 / 85  Vitals Entered By: April Manson CMA (January 07, 2007 3:40 PM)               Chief Complaint:  b leg/foot swelling .  History of Present Illness: 60 year old male c/o having foot swelling b/l for two weeks.  Feet  would decrease in swelling overnight and now the swelling is not going down at night.  Pain is 7/10 and is made better by staying of his feet.  Exacerbated by walking.  Reports of soreness around the ankles after walking for long periods of time.  The swelling seems to be the most concerning issue to him at this moment.  Pt has been followed by Dr. Verl Blalock of [place] [facility] for nonobstructive coronary disease and currently is on a CCB, ARB, HCTZ, BB.  Currently the patient is not having any symptoms of angina or shortness of breath.  Pt denies any recent increase in salt intake.  Blood pressure is not well controlled today, however on visit in 5/08 with Dr. Verl Blalock Blood pressure was much better controlled of 138/76.  Weight is increased from visit here with Dr. Smith Mince on 09/23/06  of 226 to 232 lbs.  Pt does not wear TED hose.  Pt has been offered Lasix in the past for pedal edema but has denied wanting to add an additional medication to his already complex list.    Pt recently had surgery on the 2nd toe on the right foot-july 15th.  There is currently a stage 2 ulcer in between the first big toe and 2nd toe, that has hypergranulation tissue.  Pt does not see his surgeon until much later this month.  Pt has been using 4 x4 to keep the area dry.  No signs of superinfection and no weeping or drainage from the site.    Current Allergies: ENALAPRIL MALEATE (ENALAPRIL MALEATE)  Past Medical History:    Reviewed history from 07/24/2006 and no changes required:       Nonobstructive coronary disease- Cardiologist Dr. Verl Blalock [facility])          Nuclear study low risk - 09/25/2005 EF 59%          Stress cardiolyte: neg for ischemia - 04/17/2004,           Cath/PTCA: EF 65%; stenting of mid-circumflex artery - 02/24/2002,          Sestamibi/treadmill cardiolyte: neg; EF 56% - 06/28/1999,        HTN  hx of hypokalemia       hyperlipidemia          Carotid doppler: normal - 06/27/1998,        Type II DM with complications: Diabetic macular edema, nephropathy (proteinuria), retinopathy,        Disability claim 06/1998 after fall at work,        h/o EtOH withdrawal/seizures/pancreatitis in,        h/o gastric ulcer          Colonoscopy: colon polyps (benign) - 10/26/2002,           Upper GI neg 2000,           Hpylori neg 2000              allergic rhinitis,        presbyopia, hyperopia,        hx of sciatica  Past Surgical History:    Reviewed history from 07/24/2006 and no changes required:       Cath/PTCA: EF 65%; stenting of mid-circumflex artery - 02/24/2002       Epidural injection for back pain - 01/25/2001,    Family History:    Reviewed history from 07/24/2006 and no changes required:       6 siblings: DM, HTN, gout, migraines,        Breast CA 1st degree,         Diabetes 1st degree,        Father: CHF, dementia,        Mother: DM, HTN, CVA, breast CA,        neg for CAD  Social History:    Reviewed history from 07/24/2006 and no changes required:       -Quit smoking in spring of 2004; -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   Head:     Normocephalic and atraumatic without obvious abnormalities. No apparent alopecia or balding. Neck:     No deformities, masses, or tenderness noted. Lungs:     Normal respiratory effort, chest expands symmetrically. Lungs are clear to auscultation, no crackles or wheezes. Heart:     Normal rate and regular rhythm. S1 and S2 normal without gallop, murmur, click, rub or other extra sounds. Msk:     Pt seems to drag the right foot when walking. Pt has a healed 2nd toe with extra granulation tissue  on the right from recent surgery. Inversion for both feet  about 15 degrees b/l Eversion for both feet about 15 degrees b/l Pain on palpation around the tarsal-tibia region. Bronze coloration of lower legs b/l Loss of hair on legs b/l. Pt has +2 pitting edema from foot up to sock line b/l No redness present and no warmth to the legs Pt has lost of the transverse and longitudinal arches b/l     Impression & Recommendations:  Problem # 1:  PEDAL EDEMA (ICD-782.3) Assessment: Unchanged This patient has had a history of pedal edema, however it seems to be worse for the patient over the past 2 months.  DDX includes: Possibe venous insufficiency, norvasc, fluid retention due  to increased salt intake,  undiagnosed R heart failure (doubtful since last ECHO was normal.) I would like the pt to follow up with his cardiologist in reference to medication changes since at the last time the patient saw Dr. Verl Blalock, he was better controlled with his hypertension, and on "an excellent medical program."  I would like to have the cardiologist decide whether or not the amlodipine could be the cause of his leg edema since he has a normal EF of 59% on last myoview in 2007 , without evidence of congestive heart failure.  There may be other indications that his cardiologist has placed this patient on this medication that I am not aware of at this time. I will also not add Lasix at this time since we will allow his cardiologist to decide.   Will give the patient a prescription for ted stockings to help with dependent swelling due to possible venous insufficiency.   Pt is to follow-up in three months after he has seen the cardiologist for med adjustments. Orders: Cardiology Referral (Cardiology)  His updated medication list for this problem includes:    Hyzaar 100-25 Mg Tabs (Losartan potassium-hctz) .Marland Kitchen... Take 1 tablet by mouth once a day   Problem # 2:  HYPERTENSION, BENIGN SYSTEMIC (ICD-401.1) See above.  Not currently well controlled.  Will give 1 month samples of Hyzaar for now.  Will allow cardiologist to adjust meds.   Orders: [facility]- Est  Level 4 [id number])  His updated medication list for this problem includes:    Hyzaar 100-25 Mg Tabs (Losartan potassium-hctz) .Marland Kitchen... Take 1 tablet by mouth once a day    Norvasc 10 Mg Tabs (Amlodipine besylate) .Marland Kitchen... Take 1 tablet by mouth once a day    Toprol Xl 100 Mg Tb24 (Metoprolol succinate) .Marland Kitchen... Take 1 tablet by mouth twice a day   Problem # 3:  PYOGENIC GRANULOMA (ICD-686.1) using silver nitrate, granuloma cauterized for better wound care.  Continue current 4x4 dressings in between toes.  Wear protective shoes at all times since pt known diabetic with complications.     Orders: [facility]- Est  Level 4 ([id number])   Complete Medication List: 1)  Hyzaar 100-25 Mg Tabs (Losartan potassium-hctz) .... Take 1 tablet by mouth once a day 2)  Isosorbide Mononitrate Cr 60 Mg Tb24 (Isosorbide mononitrate) .... Take 1 tablet by mouth once a day 3)  Lipitor 10 Mg Tabs (Atorvastatin calcium) .... Take 1 tablet by mouth every night 4)  Metformin Hcl 1000 Mg Tabs (Metformin hcl) 5)  Norvasc 10 Mg Tabs (Amlodipine besylate) .... Take 1 tablet by mouth once a day 6)  Paxil 30 Mg Tabs (Paroxetine hcl) .... Take 1 tablet by mouth once a day 7)  Prevacid 30 Mg Cpdr (Lansoprazole) .... Take 1 capsule by mouth once a day 8)  Relion 70/30 70-30 % Susp (Insulin isophane & regular) .... Inject 60 unit subcutaneously 9)  Toprol Xl 100 Mg Tb24 (Metoprolol succinate) .... Take 1 tablet by mouth twice a day   Patient Instructions: 1)  APPT WITH DR WALL ON TUES 8.19.08 AT 4:15PM          Appended Document: Med/Alg Import      Current Allergies: ENALAPRIL MALEATE (ENALAPRIL MALEATE)        Complete Medication List: 1)  Hyzaar 100-25 Mg Tabs (Losartan potassium-hctz) .... Take 1 tablet by mouth once a day 2)  Isosorbide Mononitrate Cr 60 Mg Tb24 (Isosorbide mononitrate) .... Take 1 tablet by mouth once a day  3)  Lipitor 10 Mg Tabs (Atorvastatin calcium) .... Take 1 tablet by mouth every night 4)  Metformin Hcl 1000 Mg Tabs (Metformin hcl) 5)  Norvasc 10 Mg Tabs (Amlodipine besylate) .... Take 1 tablet by mouth once a day 6)  Paxil 30 Mg Tabs (Paroxetine hcl) .... Take 1 tablet by mouth once a day 7)  Prevacid 30 Mg Cpdr (Lansoprazole) .... Take 1 capsule by mouth once a day 8)  Relion 70/30 70-30 % Susp (Insulin isophane & regular) .... Inject 60 unit subcutaneously 9)  Toprol Xl 100 Mg Tb24 (Metoprolol succinate) .... Take 1 tablet by mouth twice a day 10)  Bl Vitamin E Natural 400 Unit Caps (Vitamin e) .Marland Kitchen.. 1 tablet a day 11)  Vitamin C  .... 1 tablet a day 12)  Fish  Oil Concentrate 1000 Mg Caps (Omega-3 fatty acids) .Marland Kitchen.. 1 tablet daily 13)  Alba-lybe Tabs (B complex vitamins) .Marland Kitchen.. 1 tablet a day 14)  Aspirin 325 Mg Tabs (Aspirin) .Marland Kitchen.. 1 tablet a day           Appended Document: feet swelling wp copy of office note faxed to Dr. Verl Blalock

## 2010-06-26 NOTE — Assessment & Plan Note (Signed)
Summary: f/u/diabetes/el   Vital Signs:  Patient Profile:   60 Years Old Male Weight:      227 pounds Temp:     98.2 degrees F Pulse rate:   56 / minute BP sitting:   147 / 76  Pt. in pain?   no  Vitals Entered By: Christen Bame CMA (September 23, 2006 8:57 AM)              Is Patient Diabetic? Yes    Chief Complaint:  F/U DM.  History of Present Illness: 1. DM - Pt taking Insulin 70/30 55 U qam, 10 U qpm; no polydypsia, polyurea 2. BP - well controlled on norvasc, Hyzaar, but insurance dropping hyzaar 3. Pedal edema - at the end of the day he has B swelling; last ECHO 9/07 wnl; on his feet all day      Risk Factors:  Tobacco use:  never    Physical Exam  General:     alert, well-developed, well-nourished, and well-hydrated.   Head:     normocephalic and atraumatic.   Eyes:     vision grossly intact.   Ears:     no external deformities.   Nose:     no external deformity.   Lungs:     Normal respiratory effort, chest expands symmetrically. Lungs are clear to auscultation, no crackles or wheezes. Heart:     Normal rate and regular rhythm. S1 and S2 normal without gallop, murmur, click, rub or other extra sounds. Extremities:     1+ left pedal edema and 1+ right pedal edema.      Impression & Recommendations:  Problem # 1:  DIABETES MELLITUS, II, COMPLICATIONS ([id number]) Assessment: Improved A1c improved (8.7 --> 8.0), but may need to increase insulin; pts diet not neces. low CHO, recommend 60U qam and 15 U qpm w/f/u to assess Orders: A1C-[facility] [id number])   Problem # 2:  HYPERTENSION, BENIGN SYSTEMIC (ICD-401.1) Assessment: Unchanged still needs tighter control, wt loss, low Na diet, Hyzaar samples given; pt to call insurance co about what Rx they DO cover; ACE caused cough  Problem # 3:  PEDAL EDEMA (ICD-782.3) Assessment: New 08/2005 Myoview EF 59%; as swelling is mild, at the end of the day, B, and pt on feet all day, likely positional/gravity  playing a role; discussed lasix and potassium, pt not wanting another Rx at this time; is following up with Cardiologist this month, will discuss there   Laboratory Results   Blood Tests   Date/Time Recieved: September 23, 2006 9:06 AM  Date/Time Reported: September 23, 2006 9:11 AM   HGBA1C: 8.0%   (Normal Range: Non-Diabetic - 3-6%   Control Diabetic - 6-8%)  Comments: ...................................................................DONNA [facility]  September 23, 2006 9:11 AM

## 2010-06-26 NOTE — Assessment & Plan Note (Signed)
Summary: Bp, DM   Vital Signs:  Patient Profile:   60 Years Old Male Weight:      231 pounds Pulse rate:   66 / minute BP sitting:   152 / 91  Vitals Entered By: April Manson CMA (March 09, 2007 9:34 AM)                 Chief Complaint:  fu dm AND HTN.  History of Present Illness: Pt is here today for follow up Hypertension.    HTN: blood pressure running high, taking numbers at home, same as here, 150s over 90s.  at cardiologist's office, it seems to be running lower.  120/70.  pt states it is due to him "rushing."   Using medications: using hyzaar 100/25, isosorbide mononitrat  60mg   1 tablet daily, toprol 100mg  bid Chest Pain: no SOB:no HA:no Leg Edema:  amlodipine cut in half, 5mg , now not seeing any swelling.   Previous hx of CVA/TIA: no pt has not tried spironolactone but has had a history of low potassium in past month, this was mostly due to taking two hyzaar.    Pt is here today for follow up of diabetes.   DM: tried oral agents in past but was placedon insuling 15 yrs ago and off etoh.   Home sugars ranging: sugars ranging :"roller-coaster" 117- 132- 140s none above 160s.   Using meds:metformin, insulin 70/30 50 in AM and 10 in PM.   Leg/foot pain:no Last Creatinine:.97 Last HbA1C: 8.0 in april Recent infections:no    Current Allergies: ENALAPRIL MALEATE (ENALAPRIL MALEATE)  Past Medical History:    Reviewed history from 01/07/2007 and no changes required:       Nonobstructive coronary disease- Cardiologist Dr. Verl Blalock [facility])          Nuclear study low risk - 09/25/2005 EF 59%          Stress cardiolyte: neg for ischemia - 04/17/2004,           Cath/PTCA: EF 65%; stenting of mid-circumflex artery - 02/24/2002,          Sestamibi/treadmill cardiolyte: neg; EF 56% - 06/28/1999,        HTN       hx of hypokalemia       hyperlipidemia          Carotid doppler: normal - 06/27/1998,        Type II DM with complications: Diabetic macular edema, nephropathy  (proteinuria), retinopathy,        Disability claim 06/1998 after fall at work,        h/o EtOH withdrawal/seizures/pancreatitis in,        h/o gastric ulcer          Colonoscopy: colon polyps (benign) - 10/26/2002,           Upper GI neg 2000,           Hpylori neg 2000              allergic rhinitis,        presbyopia, hyperopia,        hx of sciatica  Past Surgical History:    Reviewed history from 01/07/2007 and no changes required:       Cath/PTCA: EF 65%; stenting of mid-circumflex artery - 02/24/2002       Epidural injection for back pain - 01/25/2001,       Physical Exam  General:     Well-developed,well-nourished,in no acute distress; alert,appropriate and cooperative throughout  examination Neck:     No deformities, masses, or tenderness noted. Lungs:     Normal respiratory effort, chest expands symmetrically. Lungs are clear to auscultation, no crackles or wheezes. Heart:     Normal rate and regular rhythm. S1 and S2 normal without gallop, murmur, click, rub or other extra sounds.    Impression & Recommendations:  Problem # 1:  HYPERTENSION, BENIGN SYSTEMIC (ICD-401.1) Assessment: Unchanged Pt blood pressure continues to be elevated here at [facility] but well controlled at cardiologist's office.  here today, recheck is 140/86.  Pt was noted to have U waves on EKG at Dr. Winnifred Friar office and has low levels of potassium.  WIll start sprinolacton 25mg  to help with BP and to help with potassium levels.  Pt to come back for lab work in 10 days.  Pt has never had a Potassium over 4.  Will followup bp in 3 months.  meds updated and refills of meds given for 90 day supply with 1 year of refills.   His updated medication list for this problem includes:    Hyzaar 100-25 Mg Tabs (Losartan potassium-hctz) .Marland Kitchen... Take 1 tablet by mouth once a day    Norvasc 10 Mg Tabs (Amlodipine besylate) .Marland Kitchen... Take 1/2  tablet by mouth once a day    Toprol Xl 200 Mg Tb24 (Metoprolol succinate) .Marland Kitchen... 1 tablet  one time a day for blood pressure    Spironolactone 25 Mg Tabs (Spironolactone) .Marland Kitchen... 1 tablet 1 time a day for blood pressure and to raise potassium  Orders: [facility]- Est  Level 4 (99214) Comp Met-[facility] [id number])  Future Orders: Basic Met-[facility] [id number]) ... 02/26/2008   Problem # 2:  LEG EDEMA, BILATERAL (ICD-782.3) Assessment: Improved much improved with decreasing amlodipine to half of dose at 5mg  a day.   His updated medication list for this problem includes:    Hyzaar 100-25 Mg Tabs (Losartan potassium-hctz) .Marland Kitchen... Take 1 tablet by mouth once a day    Spironolactone 25 Mg Tabs (Spironolactone) .Marland Kitchen... 1 tablet 1 time a day for blood pressure and to raise potassium  Orders: [place]- Est  Level 4 [id number])   Problem # 3:  DIABETES MELLITUS, II, COMPLICATIONS (A999333) will check HbA1c today.  continue current insulin and metformin.  Pt is improved at 7.5 from previous of 8 in april.  continue current managment.  will add additional oral agent of Januvia for goal. will give samples for 1 month and then have pt get prescription.    urine protein ratio checked today.   His updated medication list for this problem includes:    Hyzaar 100-25 Mg Tabs (Losartan potassium-hctz) .Marland Kitchen... Take 1 tablet by mouth once a day    Metformin Hcl 1000 Mg Tabs (Metformin hcl) .Marland Kitchen... 1 tablet twice a day for diabetes    Relion 70/30 70-30 % Susp (Insulin isophane & regular) ..... Inject 60 unit subcutaneously    Aspirin 325 Mg Tabs (Aspirin) .Marland Kitchen... 1 tablet a day    Januvia 100 Mg Tabs (Sitagliptin phosphate) .Marland Kitchen... 1 tablet a day  Orders: A1C-[facility] [id number]) [place]- Est  Level 4 (99214) Comp Met-[facility] [id number]) Urinalysis-[facility] (00000) Urine Protein Ratio- [place] [id number])   Problem # 4:  SCREENING FOR MALIGNANT NEOPLASM, PROSTATE (ICD-V76.44) Assessment: Comment Only will check today.   Orders: PSA (Medicare)-[facility] (G0103)   Complete Medication List: 1)  Hyzaar 100-25 Mg Tabs (Losartan potassium-hctz)  .... Take 1 tablet by mouth once a day 2)  Isosorbide Mononitrate Cr 60 Mg Tb24 (Isosorbide mononitrate) .Marland KitchenMarland KitchenMarland Kitchen  Take 1 tablet by mouth once a day 3)  Lipitor 10 Mg Tabs (Atorvastatin calcium) .... Take 1 tablet by mouth every night 4)  Metformin Hcl 1000 Mg Tabs (Metformin hcl) .Marland Kitchen.. 1 tablet twice a day for diabetes 5)  Norvasc 10 Mg Tabs (Amlodipine besylate) .... Take 1/2  tablet by mouth once a day 6)  Paxil 30 Mg Tabs (Paroxetine hcl) .... Take 1 tablet by mouth once a day 7)  Prevacid 30 Mg Cpdr (Lansoprazole) .... Take 1 capsule by mouth once a day 8)  Relion 70/30 70-30 % Susp (Insulin isophane & regular) .... Inject 60 unit subcutaneously 9)  Toprol Xl 200 Mg Tb24 (Metoprolol succinate) .Marland Kitchen.. 1 tablet one time a day for blood pressure 10)  Bl Vitamin E Natural 400 Unit Caps (Vitamin e) .Marland Kitchen.. 1 tablet a day 11)  Vitamin C  .... 1 tablet a day 12)  Fish Oil Concentrate 1000 Mg Caps (Omega-3 fatty acids) .Marland Kitchen.. 1 tablet daily 13)  Alba-lybe Tabs (B complex vitamins) .Marland Kitchen.. 1 tablet a day 14)  Aspirin 325 Mg Tabs (Aspirin) .Marland Kitchen.. 1 tablet a day 15)  Spironolactone 25 Mg Tabs (Spironolactone) .Marland Kitchen.. 1 tablet 1 time a day for blood pressure and to raise potassium 16)  Januvia 100 Mg Tabs (Sitagliptin phosphate) .Marland Kitchen.. 1 tablet a day  Other Orders: Flu Vaccine 16yrs + [id number]) Admin 1st Vaccine [id number])  Future Orders: Lipid-[facility] [id number]) ... 02/27/2008   Patient Instructions: 1)  Please schedule a follow-up appointment in 10 days after starting spironolactone for blood lab visit to check potassium.   2)  Please schedule a follow-up appointment in 3 months for blood pressure and diabetes.   3)  call either way.      Prescriptions: JANUVIA 100 MG TABS (SITAGLIPTIN PHOSPHATE) 1 tablet a day  #90 x 3   Entered and Authorized by:   Marzetta Merino  MD   Signed by:   Marzetta Merino  MD on 03/10/2007   Method used:   Electronically sent to ...       [phone] [address]       [address]       [place], [place]  [address]       Ph: [phone] or [phone]       Fax: [phone]   RxID:   [id number] RELION 70/30 70-30 % SUSP (INSULIN ISOPHANE & REGULAR) Inject 60 unit subcutaneously  #6 x 5   Entered and Authorized by:   Marzetta Merino  MD   Signed by:   Marzetta Merino  MD on 03/09/2007   Method used:   Print then Give to Patient   [id number] PAXIL 30 MG TABS (PAROXETINE HCL) Take 1 tablet by mouth once a day  #90 x 3   Entered and Authorized by:   Marzetta Merino  MD   Signed by:   Marzetta Merino  MD on 03/09/2007   Method used:   Print then Give to Patient   [id number] NORVASC 10 MG TABS (AMLODIPINE BESYLATE) Take 1/2  tablet by mouth once a day  #180 x 3   Entered and Authorized by:   Marzetta Merino  MD   Signed by:   Marzetta Merino  MD on 03/09/2007   Method used:   Print then Give to Patient   RxID:   [id number] LIPITOR 10 MG TABS (ATORVASTATIN CALCIUM) Take 1 tablet by mouth every night  #  90 x 3   Entered and Authorized by:   Marzetta Merino  MD   Signed by:   Marzetta Merino  MD on 03/09/2007   Method used:   Print then Give to Patient   RxID:   [id number] ISOSORBIDE MONONITRATE CR 60 MG TB24 (ISOSORBIDE MONONITRATE) Take 1 tablet by mouth once a day  #90 x 3   Entered and Authorized by:   Marzetta Merino  MD   Signed by:   Marzetta Merino  MD on 03/09/2007   Method used:   Print then Give to Patient   RxID:   [id number] HYZAAR 100-25 MG TABS (LOSARTAN POTASSIUM-HCTZ) Take 1 tablet by mouth once a day  #90 x 3   Entered and Authorized by:   Marzetta Merino  MD   Signed by:   Marzetta Merino  MD on 03/09/2007   Method used:   Print then Give to Patient   [id number] SPIRONOLACTONE 25 MG TABS (SPIRONOLACTONE) 1 tablet 1 time a day for blood pressure and to raise potassium  #30 x 1   Entered and Authorized by:   Marzetta Merino  MD   Signed by:   Marzetta Merino  MD on 03/09/2007   Method  used:   Print then Give to Patient   RxID:   [id number] TOPROL XL 200 MG  TB24 (METOPROLOL SUCCINATE) 1 tablet one time a day for blood pressure  #90 x 3   Entered and Authorized by:   Marzetta Merino  MD   Signed by:   Marzetta Merino  MD on 03/09/2007   Method used:   Print then Give to Patient   [id number] METFORMIN HCL 1000 MG TABS (METFORMIN HCL) 1 tablet twice a day for diabetes  #180 x 3   Entered and Authorized by:   Marzetta Merino  MD   Signed by:   Marzetta Merino  MD on 03/09/2007   Method used:   Print then Give to Patient   [id number] PREVACID 30 MG CPDR (LANSOPRAZOLE) Take 1 capsule by mouth once a day  #90 x 3   Entered and Authorized by:   Marzetta Merino  MD   Signed by:   Marzetta Merino  MD on 03/09/2007   Method used:   Print then Give to Patient   RxID:   [id number] RELION 70/30 70-30 % SUSP (INSULIN ISOPHANE & REGULAR) Inject 60 unit subcutaneously  #6 x 5   Entered and Authorized by:   Marzetta Merino  MD   Signed by:   Marzetta Merino  MD on 03/09/2007   Method used:   Print then Give to Patient   [id number] PREVACID 30 MG CPDR (LANSOPRAZOLE) Take 1 capsule by mouth once a day  #90 x 3   Entered and Authorized by:   Marzetta Merino  MD   Signed by:   Marzetta Merino  MD on 03/09/2007   Method used:   Print then Give to Patient   [id number] PAXIL 30 MG TABS (PAROXETINE HCL) Take 1 tablet by mouth once a day  #90 x 3   Entered and Authorized by:   Marzetta Merino  MD   Signed by:   Marzetta Merino  MD on 03/09/2007   Method used:   Print then Give to Patient   RxID:   [id number] LIPITOR 10 MG TABS (ATORVASTATIN CALCIUM) Take 1 tablet by mouth every night  #90 x 3  Entered and Authorized by:   Marzetta Merino  MD   Signed by:   Marzetta Merino  MD on 03/09/2007   Method used:   Print then Give to Patient   RxID:   [id number] ISOSORBIDE MONONITRATE CR 60 MG TB24 (ISOSORBIDE MONONITRATE) Take 1 tablet by mouth once a day   #90 x 3   Entered and Authorized by:   Marzetta Merino  MD   Signed by:   Marzetta Merino  MD on 03/09/2007   Method used:   Print then Give to Patient   RxID:   [id number] HYZAAR 100-25 MG TABS (LOSARTAN POTASSIUM-HCTZ) Take 1 tablet by mouth once a day  #90 x 3   Entered and Authorized by:   Marzetta Merino  MD   Signed by:   Marzetta Merino  MD on 03/09/2007   Method used:   Print then Give to Patient   RxID:   [id number]  ] Laboratory Results   Urine Tests  Date/Time Received: March 09, 2007 10:45AM  Date/Time Reported: March 09, 2007 11:28 AM    Routine Urinalysis   Color: yellow Appearance: Clear Glucose: negative   (Normal Range: Negative) Bilirubin: negative   (Normal Range: Negative) Ketone: negative   (Normal Range: Negative) Spec. Gravity: 1.020   (Normal Range: 1.003-1.035) Blood: negative   (Normal Range: Negative) pH: 7.0   (Normal Range: 5.0-8.0) Protein: 100   (Normal Range: Negative) Urobilinogen: 1.0   (Normal Range: 0-1) Nitrite: negative   (Normal Range: Negative) Leukocyte Esterace: negative   (Normal Range: Negative)  Urine Microscopic WBC/hpf: rare RBC/hpf: 0-4 Bacteria: trace Mucous: 2+ Other: 1+ amorphous    Comments: urine dip ...............test performed by ...............Marland KitchenDelores Pate-Gaddy, CMA urine micro  ...............test performed by......Marland KitchenBonnie A. Martinique, [place] (ASCP)   Blood Tests   Date/Time Received: March 09, 2007 9:37 AM  Date/Time Reported: March 09, 2007 10:08 AM   HGBA1C: 7.5%   (Normal Range: Non-Diabetic - 3-6%   Control Diabetic - 6-8%)  Comments: ...............test performed by ...............Marland KitchenDelores Pate-Gaddy, CMA   Date/Time Received: March 09, 2007 9:37 AM  Date/Time Reported: March 09, 2007 11:16 AM      Influenza Vaccine    Vaccine Type: Fluvax 3+    Site: left deltoid    Mfr: [employer], commercial    Dose: 0.5 ml    Route: IM    Given by: April Manson CMA    Exp. Date: 11/24/2007     Lot #: [id number]    VIS given: 11/23/04 version given March 09, 2007.  Flu Vaccine Consent Questions    Do you have a history of severe allergic reactions to this vaccine? no    Any prior history of allergic reactions to egg and/or gelatin? no    Do you have a sensitivity to the preservative Thimersol? no    Do you have a past history of Guillan-Barre Syndrome? no    Do you currently have an acute febrile illness? no    Have you ever had a severe reaction to latex? no    Vaccine information given and explained to patient? yes       Preventive Care Screening     Hba1c 7.5 on 10/08.

## 2010-06-26 NOTE — Progress Notes (Signed)
Summary: Follow up call  Phone Note Outgoing Call Call back at [facility] Phone [phone]   Summary of Call: Brief check in on pt, states no pain at all right now, toradol worked wonders.  Went over treatment plan again.  Pt to get abx, take percocet he already has at home for pain, f/u in one week to ensure resolution. Initial call taken by: Aundria Mems MD,  August 10, 2009 2:22 PM

## 2010-06-26 NOTE — Progress Notes (Signed)
Summary: phn msg/called pt/ts  Phone Note Call from Patient Call back at Home Phone [phone]   Caller: Patient Summary of Call: Was expecting a letter from Dr. Juleen China concerning getting him some help.  His work has cut him down to part time. Initial call taken by: Raymond Gurney,  December 26, 2009 3:50 PM  Follow-up for Phone Call        called pt. pt does not qualify for Medicaid. is trying for disability, because he is unable to afford his medical bills and medications. pt said, that he has discussed this with dr.Kebra Lowrimore already. fwd. to dr.Nikolos Billig for review. Follow-up by: Mauricia Area CMA,,  December 26, 2009 4:45 PM  Additional Follow-up for Phone Call Additional follow up Details #1::        sending to admin team to mail. Additional Follow-up by: Briscoe Deutscher DO,  December 30, 2009 9:36 PM

## 2010-06-26 NOTE — Miscellaneous (Signed)
Summary: scripts ready  Clinical Lists Changes informed pt that his prescriptions are here for pick up. he will be in the next day to get them. **he is eligible for indigent fund monies**.Elige Radon RN  November 24, 2008 5:17 PM    Do I need to fill out paperwork for the indigent fund money? Just let me know what I need to do to help with this. Thanks.

## 2010-06-26 NOTE — Progress Notes (Signed)
Summary: status of rx  Phone Note Call from Patient Call back at Home Phone [phone]   Reason for Call: Refill Medication Summary of Call: pt is checking status of rx for clonodine, [facility] Initial call taken by: Samara Snide,  December 24, 2007 12:02 PM  Follow-up for Phone Call        will forward message to MD. advised patient that message has been sent to MD. Follow-up by: Marcell Barlow RN,  December 24, 2007 12:19 PM      Prescriptions: CLONIDINE HCL 0.1 MG TABS (CLONIDINE HCL) 1 tablet twice a day for blood pressure  #60 x 5   Entered and Authorized by:   [name] MD   Signed by:   [name] MD on 12/24/2007   Method used:   Electron[facility] to ...       [facility] [address] [phone]*       [place]       [place], [place]  [address]       Ph: [phone] or [phone]       Fax: [phone]   RxID:   [id number]

## 2010-06-26 NOTE — Progress Notes (Signed)
Summary: cardiology appt/ts  ---- Converted from flag ---- ---- 12/18/2007 2:19 PM, [name] MD wrote: please call pt on Monday to make sure he has gotten an appointment with Dr. Verl Blalock (Cardiology) ------------------------------  called pt and pt will sched. appt with dr.wall tomorrow. advised pt to have dr.wall fax the notes to dr.jones. pt agreed.

## 2010-06-26 NOTE — Assessment & Plan Note (Signed)
Summary: f/u,df   Vital Signs:  Patient profile:   60 year old male Height:      72 inches Weight:      202 pounds BMI:     27.50 Temp:     98.3 degrees F oral Pulse rate:   58 / minute BP sitting:   149 / 87  (right arm) Cuff size:   regular CC: f/u DM, HTN, HLD, venous insuff Is Patient Diabetic? Yes Pain Assessment Patient in pain? no        Primary Care Provider:  Briscoe Deutscher DO  CC:  f/u DM, HTN, HLD, and venous insuff.  History of Present Illness: 60 yo AAM with  1. Diabetes: Rx Metformin, Novolin 70/30: 50 in am, 10 in pm, ASA. A1c = 7.6. He has not been checking his BG at home. Exercise = none. Diet: avoiding sugar and bread. Diabetes complications include diabetic macular edema, nephropathy (proteinuria), neuropathy, and retinopathy. He denies CP, DOE, N/V/D, HA, dizziness, increased numbness/tingling in his hands or feet.   2. HTN: Rx Metoprolol, Lasix, Imdur, Cozaar. Cardiologist is Dr. Verl Blalock with [facility], last visit 08/2009. Last myoview in 2009: EF 68%, no ischemia. Note: Amlodipine caused peripheral edema.  3. HLD: Rx Simvastatin. Denies myalgias.  4. Venous Insuff: c/o increased LE edema at the end of the day 2/2 standing all day while at work. No increased LE edema, increased pain, or color changes.  Current Medications (verified): 1)  Isosorbide Mononitrate Cr 60 Mg Tb24 (Isosorbide Mononitrate) .... Take 1 Tablet By Mouth Once A Day For Blood Pressure 2)  Metformin Hcl 1000 Mg Tabs (Metformin Hcl) .Marland Kitchen.. 1 Tablet Twice A Day For Diabetes 3)  Paroxetine Hcl 20 Mg  Tabs (Paroxetine Hcl) .... One and One-Half Tabs By Mouth Daily 4)  Prevacid 30 Mg Cpdr (Lansoprazole) .... Take 1 Capsule By Mouth Once A Day For Stomach 5)  Novolin 70/30 70-30 % Susp (Insulin Isophane & Regular) .... 50 Units in The Am and 10 Units in The Pm 6)  Metoprolol Tartrate 100 Mg Tabs (Metoprolol Tartrate) .Marland Kitchen.. 1 Tablet By Mouth Two Times A Day For Blood Pressure 7)  Fish Oil  Concentrate 1000 Mg  Caps (Omega-3 Fatty Acids) .Marland Kitchen.. 1 Tablet Daily 8)  Adult Aspirin Ec Low Strength 81 Mg Tbec (Aspirin) .Marland Kitchen.. 1 Tablet By Mouth Daily 9)  Nitrostat 0.4 Mg Subl (Nitroglycerin) .Marland Kitchen.. 1 Tab By Mouth Sl As Needed Chest Pain 10)  Multivitamins   Tabs (Multiple Vitamin) .... Take 1 Once Daily 11)  Oxycontin 10 Mg Xr12h-Tab (Oxycodone Hcl) .Marland Kitchen.. 1 Tablet By Mouth Three Times A Day- Prescribed By Pain Doctor 12)  Simvastatin 10 Mg Tabs (Simvastatin) .Marland Kitchen.. 1 At Bedtime 13)  Lasix 20 Mg Tabs (Furosemide) .... Take 2 By Mouth Daily 14)  Losartan Potassium 50 Mg Tabs (Losartan Potassium) .... One By Mouth Q Day 15)  Potassium Chloride Cr 10 Meq  Tbcr (Potassium Chloride) .... 2 By Mouth Daily 16)  Hydrocodone-Acetaminophen 10-325 Mg Tabs (Hydrocodone-Acetaminophen) .Marland Kitchen.. 1 By Mouth Up To 4 Time Per Day As Needed For Pain  Allergies (verified): 1)  Enalapril Maleate (Enalapril Maleate) PMH-FH-SH reviewed for relevance  Review of Systems      See HPI  Physical Exam  General:  Well-developed, well-nourished, in no acute distress; alert, appropriate and cooperative throughout examination. Vitals reviewed. Lungs:  Normal respiratory effort and normal breath sounds.   Heart:  Bradycardia and regular rhythm.   Pulses:  R posterior tibial normal, R dorsalis  pedis normal, L posterior tibial normal, and L dorsalis pedis normal.   Extremities:  Trace left pedal edema and trace right pedal edema.   Psych:  Oriented X3, memory intact for recent and remote, normally interactive, good eye contact, and not anxious appearing.     Impression & Recommendations:  Problem # 1:  DIABETES MELLITUS, II, COMPLICATIONS (A999333) Assessment Unchanged  His updated medication list for this problem includes:    Metformin Hcl 1000 Mg Tabs (Metformin hcl) .Marland Kitchen... 1 tablet twice a day for diabetes    Novolin 70/30 70-30 % Susp (Insulin isophane & regular) .Marland KitchenMarland KitchenMarland KitchenMarland Kitchen 50 units in the am and 10 units in the pm     Adult Aspirin Ec Low Strength 81 Mg Tbec (Aspirin) .Marland Kitchen... 1 tablet by mouth daily    Losartan Potassium 50 Mg Tabs (Losartan potassium) ..... One by mouth q day  Orders: [facility]- Est  Level 4 [id number])  Problem # 2:  HYPERTENSION, BENIGN SYSTEMIC (ICD-401.1) Assessment: Unchanged  His updated medication list for this problem includes:    Metoprolol Tartrate 100 Mg Tabs (Metoprolol tartrate) .Marland Kitchen... 1 tablet by mouth two times a day for blood pressure    Lasix 20 Mg Tabs (Furosemide) .Marland Kitchen... Take 2 by mouth daily    Losartan Potassium 50 Mg Tabs (Losartan potassium) ..... One by mouth q day  Orders: [facility]- Est  Level 4 (99214)  Problem # 3:  HYPERCHOLESTEROLEMIA (ICD-272.0) Assessment: Unchanged  His updated medication list for this problem includes:    Simvastatin 10 Mg Tabs (Simvastatin) .Marland Kitchen... 1 at bedtime  Orders: [facility]- Est  Level 4 (99214)Future Orders: T-Lipid Profile [id number]) ... 11/30/2010  Problem # 4:  VENOUS INSUFFICIENCY, LEGS (ICD-459.81) Assessment: Unchanged Rec compression stockings daily while working. Orders: [facility]- Est  Level 4 [id number])  Complete Medication List: 1)  Isosorbide Mononitrate Cr 60 Mg Tb24 (Isosorbide mononitrate) .... Take 1 tablet by mouth once a day for blood pressure 2)  Metformin Hcl 1000 Mg Tabs (Metformin hcl) .Marland Kitchen.. 1 tablet twice a day for diabetes 3)  Paroxetine Hcl 20 Mg Tabs (Paroxetine hcl) .... One and one-half tabs by mouth daily 4)  Prevacid 30 Mg Cpdr (Lansoprazole) .... Take 1 capsule by mouth once a day for stomach 5)  Novolin 70/30 70-30 % Susp (Insulin isophane & regular) .... 50 units in the am and 10 units in the pm 6)  Metoprolol Tartrate 100 Mg Tabs (Metoprolol tartrate) .Marland Kitchen.. 1 tablet by mouth two times a day for blood pressure 7)  Fish Oil Concentrate 1000 Mg Caps (Omega-3 fatty acids) .Marland Kitchen.. 1 tablet daily 8)  Adult Aspirin Ec Low Strength 81 Mg Tbec (Aspirin) .Marland Kitchen.. 1 tablet by mouth daily 9)  Nitrostat 0.4 Mg Subl (Nitroglycerin)  .Marland Kitchen.. 1 tab by mouth sl as needed chest pain 10)  Multivitamins Tabs (Multiple vitamin) .... Take 1 once daily 11)  Oxycontin 10 Mg Xr12h-tab (Oxycodone hcl) .Marland Kitchen.. 1 tablet by mouth three times a day- prescribed by pain doctor 12)  Simvastatin 10 Mg Tabs (Simvastatin) .Marland Kitchen.. 1 at bedtime 13)  Lasix 20 Mg Tabs (Furosemide) .... Take 2 by mouth daily 14)  Losartan Potassium 50 Mg Tabs (Losartan potassium) .... One by mouth q day 15)  Potassium Chloride Cr 10 Meq Tbcr (Potassium chloride) .... 2 by mouth daily 16)  Hydrocodone-acetaminophen 10-325 Mg Tabs (Hydrocodone-acetaminophen) .Marland Kitchen.. 1 by mouth up to 4 time per day as needed for pain  Other Orders: Future Orders: [facility] [id number]) ... 12/08/2010  Patient Instructions: 1)  It  was nice to see you today! 2)  I will write your letter and mail it to you next week.  Prevention & Chronic Care Immunizations   Influenza vaccine: Fluvax Non-MCR  (03/30/2009)   Influenza vaccine deferral: Not indicated  (10/16/2009)   Influenza vaccine due: 03/08/2008    Tetanus booster: 11/24/2001: Done.   Tetanus booster due: 11/25/2011    Pneumococcal vaccine: Done.  (03/27/2004)   Pneumococcal vaccine due: None  Colorectal Screening   Hemoccult: Done.  (07/26/2002)   Hemoccult due: Not Indicated    Colonoscopy: Done.  (10/26/2002)   Colonoscopy due: 10/25/2012  Other Screening   PSA: 0.52  (03/09/2007)   PSA action/deferral: Discussion deferred  (07/12/2009)   PSA due due: 03/08/2008   Smoking status: quit  (10/16/2009)  Diabetes Mellitus   HgbA1C: 7.6  (07/12/2009)   HgbA1C action/deferral: Ordered  (12/13/2009)   Hemoglobin A1C due: 12/23/2007    Eye exam: Not documented    Foot exam: yes  (10/16/2009)   Foot exam action/deferral: Not indicated   High risk foot: Not documented   Foot care education: Not documented   Foot exam due: 02/23/2009    Urine microalbumin/creatinine ratio: Not documented   Urine microalbumin action/deferral:  Not indicated    Diabetes flowsheet reviewed?: Yes   Progress toward A1C goal: Unchanged  Lipids   Total Cholesterol: 110  (10/20/2008)   Lipid panel action/deferral: Lipid Panel ordered   LDL: 59  (10/20/2008)   LDL Direct: Not documented   HDL: 40  (10/20/2008)   Triglycerides: 53  (10/20/2008)    SGOT (AST): 20  (08/15/2009)   SGPT (ALT): 15  (08/15/2009)   Alkaline phosphatase: 85  (08/15/2009)   Total bilirubin: 0.5  (08/15/2009)    Lipid flowsheet reviewed?: Yes   Progress toward LDL goal: At goal  Hypertension   Last Blood Pressure: 149 / 87  (12/13/2009)   Serum creatinine: 0.98  (08/15/2009)   Serum potassium 3.7  (08/15/2009)    Hypertension flowsheet reviewed?: Yes   Progress toward BP goal: Improved  Self-Management Support :   Personal Goals (by the next clinic visit) :     Personal A1C goal: 8  (08/15/2009)     Personal blood pressure goal: 130/80  (01/10/2009)     Personal LDL goal: 70  (03/30/2009)    Patient will work on the following items until the next clinic visit to reach self-care goals:     Medications and monitoring: take my medicines every day, bring all of my medications to every visit  (12/13/2009)     Eating: drink diet soda or water instead of juice or soda, eat more vegetables, use fresh or frozen vegetables, eat foods that are low in salt, eat baked foods instead of fried foods, eat fruit for snacks and desserts, limit or avoid alcohol  (12/13/2009)     Activity: take a 30 minute walk every day, take the stairs instead of the elevator, park at the far end of the parking lot  (12/13/2009)    Diabetes self-management support: Written self-care plan  (12/13/2009)   Diabetes care plan printed    Hypertension self-management support: Written self-care plan  (12/13/2009)   Hypertension self-care plan printed.    Lipid self-management support: Written self-care plan  (12/13/2009)   Lipid self-care plan printed.   Nursing Instructions: HgbA1C  today (see order)

## 2010-06-26 NOTE — Progress Notes (Signed)
Summary: refill  Phone Note Refill Request Call back at Home Phone [phone] Message from:  Patient  would like refill on [facility] [id number] - [facility]- [address]  Initial call taken by: Audie Clear,  August 04, 2009 11:32 AM  Follow-up for Phone Call        Filled via fax. Follow-up by: Briscoe Deutscher DO,  August 04, 2009 2:19 PM

## 2010-06-26 NOTE — Assessment & Plan Note (Signed)
Summary: fu./kh   Vital Signs:  Patient profile:   60 year old male Height:      72 inches Weight:      220.7 pounds BMI:     30.04 Temp:     98.3 degrees F oral Pulse rate:   60 / minute BP sitting:   172 / 67  (left arm) Cuff size:   large  Vitals Entered By: Isabelle Course (August 15, 2009 1:56 PM)  Serial Vital Signs/Assessments:  Time      Position  BP       Pulse  Resp  Temp     By 1:59 PM             180/72                         Isabelle Course  Comments: 1:59 PM re checked manually By: Isabelle Course   CC: F/U toe Is Patient Diabetic? Yes Did you bring your meter with you today? No Pain Assessment Patient in pain? no        Primary Care Provider:  Briscoe Deutscher DO  CC:  F/U toe.  History of Present Illness: Patient seen last week for paronchyia of left 5th toe. He was treated with drainage of pus and Rx Doxycyline. He presents today for re-evaluation.   1. Paronchyia: He feels that it is much improved and still has 3 days of Abx left. He denies fever/chills, N/V/D, HA, dizziness. He endorses increased LE edema. He notes that he has not been able to afford some of his medications but can't tell me which ones.  2. HTN: Elevated today. Patient states that he is out of some meds but can't tell me which ones.  Habits & Providers  Alcohol-Tobacco-Diet     Tobacco Status: never  Current Medications (verified): 1)  Isosorbide Mononitrate Cr 60 Mg Tb24 (Isosorbide Mononitrate) .... Take 1 Tablet By Mouth Once A Day For Blood Pressure 2)  Metformin Hcl 1000 Mg Tabs (Metformin Hcl) .Marland Kitchen.. 1 Tablet Twice A Day For Diabetes 3)  Paroxetine Hcl 20 Mg  Tabs (Paroxetine Hcl) .... One and One-Half Tabs By Mouth Daily 4)  Prevacid 30 Mg Cpdr (Lansoprazole) .... Take 1 Capsule By Mouth Once A Day For Stomach 5)  Novolin 70/30 70-30 % Susp (Insulin Isophane & Regular) .... 50 Units in The Am and 10 Units in The Pm 6)  Metoprolol Tartrate 100 Mg Tabs (Metoprolol Tartrate) .Marland Kitchen.. 1  Tablet By Mouth Two Times A Day For Blood Pressure 7)  Fish Oil Concentrate 1000 Mg  Caps (Omega-3 Fatty Acids) .Marland Kitchen.. 1 Tablet Daily 8)  Adult Aspirin Ec Low Strength 81 Mg Tbec (Aspirin) .Marland Kitchen.. 1 Tablet By Mouth Daily 9)  Nitrostat 0.4 Mg Subl (Nitroglycerin) .Marland Kitchen.. 1 Tab By Mouth Sl As Needed Chest Pain 10)  Multivitamins   Tabs (Multiple Vitamin) .... Take 1 Once Daily 11)  Oxycontin 10 Mg Xr12h-Tab (Oxycodone Hcl) .Marland Kitchen.. 1 Tablet By Mouth Three Times A Day- Prescribed By Pain Doctor 12)  Simvastatin 10 Mg Tabs (Simvastatin) .Marland Kitchen.. 1 At Bedtime 13)  Lasix 20 Mg Tabs (Furosemide) .... Take 1 By Mouth Daily 14)  Losartan Potassium 50 Mg Tabs (Losartan Potassium) .... One By Mouth Q Day 15)  Doxycycline Hyclate 100 Mg Caps (Doxycycline Hyclate) .... One By Mouth Two Times A Day X 7 Days  Allergies (verified): 1)  Enalapril Maleate (Enalapril Maleate) PMH-FH-SH reviewed for relevance  Social History: Smoking  Status:  never  Review of Systems      See HPI General:  Denies chills and fever. CV:  Complains of swelling of feet; denies chest pain or discomfort and shortness of breath with exertion. Resp:  Denies cough, sputum productive, and wheezing. MS:  Denies joint pain, joint redness, and joint swelling. Derm:  Complains of changes in color of skin; denies lesion(s).  Physical Exam  General:  Well-developed, well-nourished, in no acute distress; alert, appropriate and cooperative throughout examination. Vitals reviewed. Pulses:  R posterior tibial normal, R dorsalis pedis normal, L posterior tibial normal, and L dorsalis pedis normal.   Extremities:  Right 5th toe with decreased redness and swelling since previous visit. Denies ttp. No drainage. Good cap refill. Toenails all thickened, yellowish, dystrophic. LE with 1+ pitting edema bilaterally.  Diabetes Management Exam:    Foot Exam (with socks and/or shoes not present):       Sensory-Pinprick/Light touch:          Left medial foot (L-4):  normal          Left dorsal foot (L-5): normal          Left lateral foot (S-1): normal          Right medial foot (L-4): normal          Right dorsal foot (L-5): normal          Right lateral foot (S-1): normal       Sensory-Monofilament:          Left foot: diminished          Right foot: diminished       Inspection:          Left foot: abnormal          Right foot: abnormal       Nails:          Left foot: fungal infection          Right foot: fungal infection   Impression & Recommendations:  Problem # 1:  PARONYCHIA, TOE (ICD-681.11) Assessment Improved  Continues to improve. Still has 3-4 days of Doxy left to finish. Red Flags given for promptly seeking medical attention.  The following medications were removed from the medication list:    Penicillin V Potassium 500 Mg Tabs (Penicillin v potassium) ..... One tab three times a day for 10 days His updated medication list for this problem includes:    Doxycycline Hyclate 100 Mg Caps (Doxycycline hyclate) ..... One by mouth two times a day x 7 days  Orders: [facility]- Est Level  3 [id number])  Problem # 2:  HYPERTENSION, BENIGN SYSTEMIC (ICD-401.1) Assessment: Deteriorated Restart medications. Will recheck at next visit. His updated medication list for this problem includes:    Metoprolol Tartrate 100 Mg Tabs (Metoprolol tartrate) .Marland Kitchen... 1 tablet by mouth two times a day for blood pressure    Lasix 20 Mg Tabs (Furosemide) .Marland Kitchen... Take 2 by mouth daily    Losartan Potassium 50 Mg Tabs (Losartan potassium) ..... One by mouth q day  Orders: [facility] [id number]) [facility]- Est Level  3 [id number])  Problem # 3:  LEG EDEMA, BILATERAL (ICD-782.3) Assessment: Deteriorated  Increase Lasix to 2 by mouth daily while legs swollen. Check BMP today. His updated medication list for this problem includes:    Lasix 20 Mg Tabs (Furosemide) .Marland Kitchen... Take 2 by mouth daily  Orders: [facility]- Est Level  3 [id number])  Problem # 4:  SOCIAL Patient has part  time  job - works 35 hours per week. Has poor insurance. Has already used [employer]. Not eligible for [facility] MAP. All medications generic. Patient needs assistance with medication cost. Will look for more resources for him.  Complete Medication List: 1)  Isosorbide Mononitrate Cr 60 Mg Tb24 (Isosorbide mononitrate) .... Take 1 tablet by mouth once a day for blood pressure 2)  Metformin Hcl 1000 Mg Tabs (Metformin hcl) .Marland Kitchen.. 1 tablet twice a day for diabetes 3)  Paroxetine Hcl 20 Mg Tabs (Paroxetine hcl) .... One and one-half tabs by mouth daily 4)  Prevacid 30 Mg Cpdr (Lansoprazole) .... Take 1 capsule by mouth once a day for stomach 5)  Novolin 70/30 70-30 % Susp (Insulin isophane & regular) .... 50 units in the am and 10 units in the pm 6)  Metoprolol Tartrate 100 Mg Tabs (Metoprolol tartrate) .Marland Kitchen.. 1 tablet by mouth two times a day for blood pressure 7)  Fish Oil Concentrate 1000 Mg Caps (Omega-3 fatty acids) .Marland Kitchen.. 1 tablet daily 8)  Adult Aspirin Ec Low Strength 81 Mg Tbec (Aspirin) .Marland Kitchen.. 1 tablet by mouth daily 9)  Nitrostat 0.4 Mg Subl (Nitroglycerin) .Marland Kitchen.. 1 tab by mouth sl as needed chest pain 10)  Multivitamins Tabs (Multiple vitamin) .... Take 1 once daily 11)  Oxycontin 10 Mg Xr12h-tab (Oxycodone hcl) .Marland Kitchen.. 1 tablet by mouth three times a day- prescribed by pain doctor 12)  Simvastatin 10 Mg Tabs (Simvastatin) .Marland Kitchen.. 1 at bedtime 13)  Lasix 20 Mg Tabs (Furosemide) .... Take 2 by mouth daily 14)  Losartan Potassium 50 Mg Tabs (Losartan potassium) .... One by mouth q day 15)  Doxycycline Hyclate 100 Mg Caps (Doxycycline hyclate) .... One by mouth two times a day x 7 days  Patient Instructions: 1)  Double your Lasix. 2)  We will check labs today.   Prevention & Chronic Care Immunizations   Influenza vaccine: Fluvax Non-MCR  (03/30/2009)   Influenza vaccine due: 03/08/2008    Tetanus booster: 11/24/2001: Done.   Tetanus booster due: 11/25/2011    Pneumococcal vaccine: Done.   (03/27/2004)   Pneumococcal vaccine due: None  Colorectal Screening   Hemoccult: Done.  (07/26/2002)   Hemoccult due: Not Indicated    Colonoscopy: Done.  (10/26/2002)   Colonoscopy due: 10/25/2012  Other Screening   PSA: 0.52  (03/09/2007)   PSA action/deferral: Discussion deferred  (07/12/2009)   PSA due due: 03/08/2008   Smoking status: never  (08/15/2009)  Diabetes Mellitus   HgbA1C: 7.6  (07/12/2009)   Hemoglobin A1C due: 12/23/2007    Eye exam: Not documented    Foot exam: yes  (08/15/2009)   Foot exam action/deferral: Not indicated   High risk foot: Not documented   Foot care education: Not documented   Foot exam due: 02/23/2009    Urine microalbumin/creatinine ratio: Not documented   Urine microalbumin action/deferral: Not indicated    Diabetes flowsheet reviewed?: Yes   Progress toward A1C goal: At goal  Lipids   Total Cholesterol: 110  (10/20/2008)   LDL: 59  (10/20/2008)   LDL Direct: Not documented   HDL: 40  (10/20/2008)   Triglycerides: 53  (10/20/2008)    SGOT (AST): 18  (10/20/2008)   SGPT (ALT): 20  (10/20/2008) CMP ordered    Alkaline phosphatase: 103  (10/20/2008)   Total bilirubin: 0.8  (10/20/2008)    Lipid flowsheet reviewed?: Yes   Progress toward LDL goal: At goal  Hypertension   Last Blood Pressure: 172 / 67  (  08/15/2009)   Serum creatinine: 0.95  (10/20/2008)   Serum potassium 3.9  (10/20/2008) CMP ordered     Hypertension flowsheet reviewed?: Yes   Progress toward BP goal: Deteriorated  Self-Management Support :   Personal Goals (by the next clinic visit) :     Personal A1C goal: 8  (08/15/2009)     Personal blood pressure goal: 130/80  (01/10/2009)     Personal LDL goal: 70  (03/30/2009)    Patient will work on the following items until the next clinic visit to reach self-care goals:     Medications and monitoring: take my medicines every day, bring all of my medications to every visit  (08/15/2009)     Eating: drink  diet soda or water instead of juice or soda, eat more vegetables, use fresh or frozen vegetables, eat foods that are low in salt, eat baked foods instead of fried foods, eat fruit for snacks and desserts, limit or avoid alcohol  (08/15/2009)     Activity: take a 30 minute walk every day, take the stairs instead of the elevator, park at the far end of the parking lot  (08/15/2009)    Diabetes self-management support: Written self-care plan  (08/15/2009)   Diabetes care plan printed    Hypertension self-management support: Written self-care plan  (08/15/2009)   Hypertension self-care plan printed.    Lipid self-management support: Written self-care plan  (08/15/2009)   Lipid self-care plan printed.

## 2010-06-26 NOTE — Progress Notes (Signed)
Summary: blood pressure  Phone Note Call from Patient   Summary of Call: pt brought in a list of blood pressure reading and pulse.  blood pressure readings 188/98; P 58, 182/98, p 62; 169/99 p 63, 171/98, p 63, 162/98, p551, 146/90, p58, 145/79, 58.  pt currently taking all bp meds.  pt complaining of erectile dysfunction.  will decrease toprol xl to 100mg  and add clonidine 0.1mg  by mouth two times a day.  will have pt to record bp's and pulses for 1 week and bring in again to adjust as necessary.   Initial call taken by: Marzetta Merino  MD,  Oct 09, 2007 5:19 PM    New/Updated Medications: TOPROL XL 100 MG TB24 (METOPROLOL SUCCINATE) 1 by mouth daily, not decrease in dose. CLONIDINE HCL 0.1 MG TABS (CLONIDINE HCL) 1 tablet twice a day for blood pressure   Prescriptions: TOPROL XL 100 MG TB24 (METOPROLOL SUCCINATE) 1 by mouth daily, not decrease in dose.  #30 x 2   Entered and Authorized by:   Marzetta Merino  MD   Signed by:   Marzetta Merino  MD on 10/09/2007   Method used:   Electronically sent to ...       [facility]  [address] [phone]*       [place]       [place], [place]  [address]       Ph: [phone] or [phone]       Fax: [phone]   RxID:   [id number] CLONIDINE HCL 0.1 MG TABS (CLONIDINE HCL) 1 tablet twice a day for blood pressure  #60 x 1   Entered and Authorized by:   Marzetta Merino  MD   Signed by:   Marzetta Merino  MD on 10/09/2007   Method used:   Electronically sent to ...       [facility]  [address] [phone]*       [place]       [place], [place]  [address]       Ph: [phone] or [phone]       Fax: [phone]   RxID:   [id number]

## 2010-06-26 NOTE — Progress Notes (Signed)
Summary: Rx Req  Phone Note Refill Request Call back at Home Phone [phone] Message from:  Patient  Refills Requested: Medication #1:  LASIX 20 MG TABS take 1 by mouth daily  Medication #2:  METOPROLOL TARTRATE 100 MG TABS 1 tablet by mouth two times a day for blood pressure PT WOULD LIKE TO GET 90 DAY SUPPLY FOR THESE.  [facility] [address]  Initial call taken by: Raymond Gurney,  June 15, 2009 11:01 AM  Follow-up for Phone Call        will forward to MD. Follow-up by: Marcell Barlow RN,  June 15, 2009 11:07 AM  Additional Follow-up for Phone Call Additional follow up Details #1::        Please tell this patient to make a follow up appt in February. Rx refilled today. Additional Follow-up by: Briscoe Deutscher DO,  June 15, 2009 1:21 PM    Prescriptions: METOPROLOL TARTRATE 100 MG TABS (METOPROLOL TARTRATE) 1 tablet by mouth two times a day for blood pressure  #180 x 3   Entered and Authorized by:   Briscoe Deutscher DO   Signed by:   Briscoe Deutscher DO on 06/15/2009   Method used:   Electronically to        [facility] [address] [phone]* (retail)       [address]       [place], [place]  [address]       Ph: [phone] or [phone]       Fax: [phone]   RxID:   [id number] LASIX 20 MG TABS (FUROSEMIDE) take 1 by mouth daily  #90 x 3   Entered and Authorized by:   Briscoe Deutscher DO   Signed by:   Briscoe Deutscher DO on 06/15/2009   Method used:   Electronically to        [facility] [address] [phone]* (retail)       [address]       [place], [place]  [address]       Ph: [phone] or [phone]       Fax: [phone]   RxID:   [id number]   Appended Document: Rx Req patient notified.

## 2010-06-26 NOTE — Assessment & Plan Note (Signed)
Summary: strep?/[place]   Vital Signs:  Patient profile:   60 year old male Weight:      216 pounds Temp:     98 degrees F oral Pulse rate:   80 / minute BP sitting:   150 / 90  (left arm)  Vitals Entered By: Mauricia Area CMA, (July 13, 2009 11:16 AM) CC: sore throat. exposed to strep. Pain Assessment Patient in pain? no        Primary Care Provider:  Briscoe Deutscher DO  CC:  sore throat. exposed to strep.Marland Kitchen  History of Present Illness: Exposed to grandaughter and daughter who both have strep throat.  Developed chills and sore throat in past 24 hours.  Came in for this purpose only.  Seen yesterday for ongoing chronic issues.  Habits & Providers  Alcohol-Tobacco-Diet     Tobacco Status: never  Current Medications (verified): 1)  Isosorbide Mononitrate Cr 60 Mg Tb24 (Isosorbide Mononitrate) .... Take 1 Tablet By Mouth Once A Day For Blood Pressure 2)  Metformin Hcl 1000 Mg Tabs (Metformin Hcl) .Marland Kitchen.. 1 Tablet Twice A Day For Diabetes 3)  Paroxetine Hcl 20 Mg  Tabs (Paroxetine Hcl) .... One and One-Half Tabs By Mouth Daily 4)  Prevacid 30 Mg Cpdr (Lansoprazole) .... Take 1 Capsule By Mouth Once A Day For Stomach 5)  Novolin 70/30 70-30 % Susp (Insulin Isophane & Regular) .... 50 Units in The Am and 10 Units in The Pm 6)  Metoprolol Tartrate 100 Mg Tabs (Metoprolol Tartrate) .Marland Kitchen.. 1 Tablet By Mouth Two Times A Day For Blood Pressure 7)  Fish Oil Concentrate 1000 Mg  Caps (Omega-3 Fatty Acids) .Marland Kitchen.. 1 Tablet Daily 8)  Adult Aspirin Ec Low Strength 81 Mg Tbec (Aspirin) .Marland Kitchen.. 1 Tablet By Mouth Daily 9)  Nitrostat 0.4 Mg Subl (Nitroglycerin) .Marland Kitchen.. 1 Tab By Mouth Sl As Needed Chest Pain 10)  Multivitamins   Tabs (Multiple Vitamin) .... Take 1 Once Daily 11)  Oxycontin 10 Mg Xr12h-Tab (Oxycodone Hcl) .Marland Kitchen.. 1 Tablet By Mouth Three Times A Day- Prescribed By Pain Doctor 12)  Simvastatin 10 Mg Tabs (Simvastatin) .Marland Kitchen.. 1 At Bedtime 13)  Lasix 20 Mg Tabs (Furosemide) .... Take 1 By Mouth  Daily 14)  Losartan Potassium 50 Mg Tabs (Losartan Potassium) .... One By Mouth Q Day 15)  Penicillin V Potassium 500 Mg Tabs (Penicillin V Potassium) .... One Tab Three Times A Day For 10 Days  Allergies: 1)  Enalapril Maleate (Enalapril Maleate)  Social History: Smoking Status:  never  Review of Systems General:  Complains of chills and fever. ENT:  Complains of sore throat; denies nasal congestion. Resp:  Denies cough.  Physical Exam  General:  alert and well-developed.   Ears:  TM normal Nose:  External nasal examination shows no deformity or inflammation. Nasal mucosa are pink and moist without lesions or exudates. Mouth:  Bright red tonsilar tissue Lungs:  normal respiratory effort and normal breath sounds.   Heart:  normal rate and regular rhythm.     Impression & Recommendations:  Problem # 1:  STREPTOCOCCAL PHARYNGITIS (ICD-034.0)  + test, unusual in middle age individual His updated medication list for this problem includes:    Adult Aspirin Ec Low Strength 81 Mg Tbec (Aspirin) .Marland Kitchen... 1 tablet by mouth daily    Penicillin V Potassium 500 Mg Tabs (Penicillin v potassium) ..... One tab three times a day for 10 days  Orders: [facility]- Est Level  3 [id number])  Complete Medication List: 1)  Isosorbide Mononitrate  Cr 60 Mg Tb24 (Isosorbide mononitrate) .... Take 1 tablet by mouth once a day for blood pressure 2)  Metformin Hcl 1000 Mg Tabs (Metformin hcl) .Marland Kitchen.. 1 tablet twice a day for diabetes 3)  Paroxetine Hcl 20 Mg Tabs (Paroxetine hcl) .... One and one-half tabs by mouth daily 4)  Prevacid 30 Mg Cpdr (Lansoprazole) .... Take 1 capsule by mouth once a day for stomach 5)  Novolin 70/30 70-30 % Susp (Insulin isophane & regular) .... 50 units in the am and 10 units in the pm 6)  Metoprolol Tartrate 100 Mg Tabs (Metoprolol tartrate) .Marland Kitchen.. 1 tablet by mouth two times a day for blood pressure 7)  Fish Oil Concentrate 1000 Mg Caps (Omega-3 fatty acids) .Marland Kitchen.. 1 tablet daily 8)  Adult  Aspirin Ec Low Strength 81 Mg Tbec (Aspirin) .Marland Kitchen.. 1 tablet by mouth daily 9)  Nitrostat 0.4 Mg Subl (Nitroglycerin) .Marland Kitchen.. 1 tab by mouth sl as needed chest pain 10)  Multivitamins Tabs (Multiple vitamin) .... Take 1 once daily 11)  Oxycontin 10 Mg Xr12h-tab (Oxycodone hcl) .Marland Kitchen.. 1 tablet by mouth three times a day- prescribed by pain doctor 12)  Simvastatin 10 Mg Tabs (Simvastatin) .Marland Kitchen.. 1 at bedtime 13)  Lasix 20 Mg Tabs (Furosemide) .... Take 1 by mouth daily 14)  Losartan Potassium 50 Mg Tabs (Losartan potassium) .... One by mouth q day 15)  Penicillin V Potassium 500 Mg Tabs (Penicillin v potassium) .... One tab three times a day for 10 days  Other Orders: Rapid Strep-[facility] [id number])  Patient Instructions: 1)  Take all of the penicillin as directed 2)  Use Tylenol for chills and fever 3)  May return to work tomorrow if feeling better. Prescriptions: PENICILLIN V POTASSIUM 500 MG TABS (PENICILLIN V POTASSIUM) one tab three times a day for 10 days  #30 x 0   Entered and Authorized by:   Tereasa Coop NP   Signed by:   Tereasa Coop NP on 07/13/2009   Method used:   Electronically to        [facility] [address] [phone]* (retail)       [address]       [place], [place]  [address]       Ph: [phone] or [phone]       Fax: [phone]   RxID:   [id number]   Laboratory Results  Date/Time Received: July 13, 2009 11:39 AM  Date/Time Reported: July 13, 2009 11:50 AM   Other Tests  Rapid Strep: positive Comments: ...........test performed by..........Marland Kitchen Amado Nash, SMA

## 2010-06-26 NOTE — Progress Notes (Signed)
Summary: Appt information  Phone Note Call from Patient Call back at Home Phone [phone]   Reason for Call: Talk to Nurse Summary of Call: pt wanted to let Dr. Ronnald Ramp know he has an appt with Dr. Verl Blalock 8/7 @ 2:15 Initial call taken by: Samara Snide,  December 22, 2007 11:38 AM  Follow-up for Phone Call        noted.  Follow-up by: [name] MD,  December 22, 2007 11:38 AM

## 2010-06-26 NOTE — Assessment & Plan Note (Signed)
Summary: hosp f/u/[place]   Vital Signs:  Patient profile:   60 year old male Height:      72 inches Weight:      215 pounds Temp:     97.9 degrees F oral Pulse rate:   60 / minute BP sitting:   149 / 75  (left arm) Cuff size:   large  Vitals Entered By: Amado Nash, SMA CC: kidney stone, edema, incontinence Is Patient Diabetic? Yes Pain Assessment Patient in pain? no        Primary Care Provider:  Briscoe Deutscher DO  CC:  kidney stone, edema, and incontinence.  History of Present Illness: 60 year old AAM:  1. Kidney Stone: patient went to ED on 4/11 with right flank pain and Dx with kidney stone. CT abd/pelvis: 2 mm right ureterovesicular junction region calculus producing mild fullness of the right intrarenal collecting system and ureter. UA with no infxn, + hematuria. WBC 8.2. K 3.3. He was DC 'd home with Hydrocodone as needed for severe pain. he feels that he has passed the stone - states that he had pain with urination, felt "stopped up," then felt the stone pass, now urinating normal again. he brought the stone in today.  2. LE Edema: increased at last visit, so he was advised to increase his Lasix to 2 pills daily. today, the edema has much improved.   3. Incontinence: worse on extra Lasix, must get to bathroom when he feels that urge to urinate, no hesitancy, no dribble, good stream, feels empty after, recent prostate check "normal" at ED per patient. no Hx BPH. urinates  ~ 5 times during the day and about every hour at night. takes Lasix at night before bed.  Habits & Providers  Alcohol-Tobacco-Diet     Tobacco Status: quit  Current Medications (verified): 1)  Isosorbide Mononitrate Cr 60 Mg Tb24 (Isosorbide Mononitrate) .... Take 1 Tablet By Mouth Once A Day For Blood Pressure 2)  Metformin Hcl 1000 Mg Tabs (Metformin Hcl) .Marland Kitchen.. 1 Tablet Twice A Day For Diabetes 3)  Paroxetine Hcl 20 Mg  Tabs (Paroxetine Hcl) .... One and One-Half Tabs By Mouth Daily 4)  Prevacid 30 Mg  Cpdr (Lansoprazole) .... Take 1 Capsule By Mouth Once A Day For Stomach 5)  Novolin 70/30 70-30 % Susp (Insulin Isophane & Regular) .... 50 Units in The Am and 10 Units in The Pm 6)  Metoprolol Tartrate 100 Mg Tabs (Metoprolol Tartrate) .Marland Kitchen.. 1 Tablet By Mouth Two Times A Day For Blood Pressure 7)  Fish Oil Concentrate 1000 Mg  Caps (Omega-3 Fatty Acids) .Marland Kitchen.. 1 Tablet Daily 8)  Adult Aspirin Ec Low Strength 81 Mg Tbec (Aspirin) .Marland Kitchen.. 1 Tablet By Mouth Daily 9)  Nitrostat 0.4 Mg Subl (Nitroglycerin) .Marland Kitchen.. 1 Tab By Mouth Sl As Needed Chest Pain 10)  Multivitamins   Tabs (Multiple Vitamin) .... Take 1 Once Daily 11)  Oxycontin 10 Mg Xr12h-Tab (Oxycodone Hcl) .Marland Kitchen.. 1 Tablet By Mouth Three Times A Day- Prescribed By Pain Doctor 12)  Simvastatin 10 Mg Tabs (Simvastatin) .Marland Kitchen.. 1 At Bedtime 13)  Lasix 20 Mg Tabs (Furosemide) .... Take 2 By Mouth Daily 14)  Losartan Potassium 50 Mg Tabs (Losartan Potassium) .... One By Mouth Q Day 15)  Potassium Chloride Cr 10 Meq  Tbcr (Potassium Chloride) .... 2 By Mouth Daily 16)  Hydrocodone-Acetaminophen 10-325 Mg Tabs (Hydrocodone-Acetaminophen) .Marland Kitchen.. 1 By Mouth Up To 4 Time Per Day As Needed For Pain  Allergies (verified): 1)  Enalapril Maleate (  Enalapril Maleate)  Past History:  Past Medical History: Nonobstructive Coronary Disease - Cardiologist Dr. Verl Blalock [facility])    Myoview: EF 68%, no ischemia - 2009    Nuclear study low risk:  EF 59% - 09/25/2005    Stress cardiolyte: neg for ischemia - 04/17/2004    Cath/PTCA: EF 65%; drug eluting stent of mid-circumflex artery - 02/24/2002    Sestamibi/treadmill cardiolyte: neg; EF 56% - 06/28/1999    Nuclear stress test negative for inschemia- 8/09 HTN   HLD Carotid Doppler: normal - 06/27/1998  Type II DM with complications: Diabetic macular edema, nephropathy (proteinuria), retinopathy, neuropathy h/o EtOH withdrawal/seizures/pancreatitis h/o Gastric Ulcer        Colonoscopy: colon polyps (benign) - 10/26/2002    Upper GI  neg 2000    Hpylori neg 2000 Presbyopia, Hyperopia h/o chronic low back pain - prescribed oxycontin three times a day by pain specialist (worker's comp case) Kidney Stones PMH-FH-SH reviewed for relevance  Social History: Smoking Status:  quit  Review of Systems General:  Denies chills and fever. CV:  Denies chest pain or discomfort, shortness of breath with exertion, and swelling of feet. GU:  Denies dysuria, urinary frequency, and urinary hesitancy.  Physical Exam  General:  Well-developed, well-nourished, in no acute distress; alert, appropriate and cooperative throughout examination. Vitals reviewed. Lungs:  Normal respiratory effort and normal breath sounds.   Heart:  Bradycardia and regular rhythm.   Abdomen:  Bowel sounds positive,abdomen soft and non-tender without masses, organomegaly or hernias noted. No CVA tenderness. Prostate:  Refused. Pulses:  R posterior tibial normal, R dorsalis pedis normal, L posterior tibial normal, and L dorsalis pedis normal.   Extremities:  Trace left pedal edema and trace right pedal edema.   Psych:  Oriented X3, memory intact for recent and remote, normally interactive, good eye contact, and not anxious appearing.     Impression & Recommendations:  Problem # 1:  NEPHROLITHIASIS (ICD-592.0) Assessment Improved Red Flags given. Orders: Urinalysis-[facility] (00000) [place]- Est  Level 4 [id number])  Problem # 2:  LEG EDEMA, BILATERAL (ICD-782.3) Assessment: Improved The patient was advised to ecrease Lasix to one by mouth daily. If edema comes back, increase to 2 by mouth daily and let me know. Explained that Lasix is better when taken during the day for most people and will last about 6 hours from time taken. His updated medication list for this problem includes:    Lasix 20 Mg Tabs (Furosemide) .Marland Kitchen... Take 2 by mouth daily  Orders: [place]- Est  Level 4 [id number])  Problem # 3:  URINARY INCONTINENCE, URGE, MILD (ICD-788.31) Assessment: New Seems to be  worse with Lasix. Patient refused rectal exam today. Decrease Lasix. If continues, we will discuss medications at the next visit after rectal exam. Orders: [place]- Est  Level 4 [id number])  Problem # 4:  HYPOKALEMIA (ICD-276.8) Assessment: New Supplement.  Complete Medication List: 1)  Isosorbide Mononitrate Cr 60 Mg Tb24 (Isosorbide mononitrate) .... Take 1 tablet by mouth once a day for blood pressure 2)  Metformin Hcl 1000 Mg Tabs (Metformin hcl) .Marland Kitchen.. 1 tablet twice a day for diabetes 3)  Paroxetine Hcl 20 Mg Tabs (Paroxetine hcl) .... One and one-half tabs by mouth daily 4)  Prevacid 30 Mg Cpdr (Lansoprazole) .... Take 1 capsule by mouth once a day for stomach 5)  Novolin 70/30 70-30 % Susp (Insulin isophane & regular) .... 50 units in the am and 10 units in the pm 6)  Metoprolol Tartrate 100 Mg Tabs (Metoprolol  tartrate) .Marland Kitchen.. 1 tablet by mouth two times a day for blood pressure 7)  Fish Oil Concentrate 1000 Mg Caps (Omega-3 fatty acids) .Marland Kitchen.. 1 tablet daily 8)  Adult Aspirin Ec Low Strength 81 Mg Tbec (Aspirin) .Marland Kitchen.. 1 tablet by mouth daily 9)  Nitrostat 0.4 Mg Subl (Nitroglycerin) .Marland Kitchen.. 1 tab by mouth sl as needed chest pain 10)  Multivitamins Tabs (Multiple vitamin) .... Take 1 once daily 11)  Oxycontin 10 Mg Xr12h-tab (Oxycodone hcl) .Marland Kitchen.. 1 tablet by mouth three times a day- prescribed by pain doctor 12)  Simvastatin 10 Mg Tabs (Simvastatin) .Marland Kitchen.. 1 at bedtime 13)  Lasix 20 Mg Tabs (Furosemide) .... Take 2 by mouth daily 14)  Losartan Potassium 50 Mg Tabs (Losartan potassium) .... One by mouth q day 15)  Potassium Chloride Cr 10 Meq Tbcr (Potassium chloride) .... 2 by mouth daily 16)  Hydrocodone-acetaminophen 10-325 Mg Tabs (Hydrocodone-acetaminophen) .Marland Kitchen.. 1 by mouth up to 4 time per day as needed for pain  Patient Instructions: 1)  It was nice to see you today! 2)  You may decrease your Lasix to one pill daily. If you start to have leg swelling again, then go back to 2 pills daily. 3)   Start the potassium supplement. Prescriptions: LASIX 20 MG TABS (FUROSEMIDE) take 2 by mouth daily  #180 x 3   Entered and Authorized by:   Briscoe Deutscher DO   Signed by:   Briscoe Deutscher DO on 09/07/2009   Method used:   Print then Give to Patient   RxID:   [id number] POTASSIUM CHLORIDE CR 10 MEQ  TBCR (POTASSIUM CHLORIDE) 2 by mouth daily  #180 x 0   Entered and Authorized by:   Briscoe Deutscher DO   Signed by:   Briscoe Deutscher DO on 09/07/2009   Method used:   Print then Give to Patient   RxID:   [id number] HYDROCODONE-ACETAMINOPHEN 10-325 MG TABS (HYDROCODONE-ACETAMINOPHEN) 1 by mouth up to 4 time per day as needed for pain  #30 x 0   Entered and Authorized by:   Briscoe Deutscher DO   Signed by:   Briscoe Deutscher DO on 09/07/2009   Method used:   Print then Give to Patient   RxID:   [id number] POTASSIUM CHLORIDE CR 10 MEQ  TBCR (POTASSIUM CHLORIDE) 2 by mouth daily  #180 x 0   Entered and Authorized by:   Briscoe Deutscher DO   Signed by:   Briscoe Deutscher DO on 09/07/2009   Method used:   Electronically to        [facility] [address] [phone]* (retail)       [address]       [place], [place]  [address]       Ph: [phone] or [phone]       Fax: [phone]   RxID:   [id number]    Laboratory Results   Urine Tests  Date/Time Received: September 07, 2009 11:20 AM  Date/Time Reported: September 07, 2009 11:40 AM   Routine Urinalysis   Color: yellow Appearance: Clear Glucose: 100   (Normal Range: Negative) Bilirubin: negative   (Normal Range: Negative) Ketone: negative   (Normal Range: Negative) Spec. Gravity: 1.010   (Normal Range: 1.003-1.035) Blood: moderate   (Normal Range: Negative) pH: 5.5   (Normal Range: 5.0-8.0) Protein: 100   (Normal Range: Negative) Urobilinogen: 0.2   (Normal Range: 0-1) Nitrite: negative   (Normal Range: Negative) Leukocyte Esterace: negative   (  Normal Range: Negative)  Urine Microscopic WBC/HPF:  1-5 RBC/HPF: 1-5 Bacteria/HPF: 2+ Epithelial/HPF: rare    Comments: ...........test performed by...........Marland KitchenHedy Camara, CMA      Prevention & Chronic Care Immunizations   Influenza vaccine: Fluvax Non-MCR  (03/30/2009)   Influenza vaccine due: 03/08/2008    Tetanus booster: 11/24/2001: Done.   Tetanus booster due: 11/25/2011    Pneumococcal vaccine: Done.  (03/27/2004)   Pneumococcal vaccine due: None  Colorectal Screening   Hemoccult: Done.  (07/26/2002)   Hemoccult due: Not Indicated    Colonoscopy: Done.  (10/26/2002)   Colonoscopy due: 10/25/2012  Other Screening   PSA: 0.52  (03/09/2007)   PSA action/deferral: Discussion deferred  (07/12/2009)   PSA due due: 03/08/2008   Smoking status: quit  (09/07/2009)  Diabetes Mellitus   HgbA1C: 7.6  (07/12/2009)   Hemoglobin A1C due: 12/23/2007    Eye exam: Not documented    Foot exam: yes  (08/15/2009)   Foot exam action/deferral: Not indicated   High risk foot: Not documented   Foot care education: Not documented   Foot exam due: 02/23/2009    Urine microalbumin/creatinine ratio: Not documented   Urine microalbumin action/deferral: Not indicated    Diabetes flowsheet reviewed?: Yes   Progress toward A1C goal: At goal  Lipids   Total Cholesterol: 110  (10/20/2008)   LDL: 59  (10/20/2008)   LDL Direct: Not documented   HDL: 40  (10/20/2008)   Triglycerides: 53  (10/20/2008)    SGOT (AST): 20  (08/15/2009)   SGPT (ALT): 15  (08/15/2009)   Alkaline phosphatase: 85  (08/15/2009)   Total bilirubin: 0.5  (08/15/2009)    Lipid flowsheet reviewed?: Yes   Progress toward LDL goal: At goal  Hypertension   Last Blood Pressure: 149 / 75  (09/07/2009)   Serum creatinine: 0.98  (08/15/2009)   Serum potassium 3.7  (08/15/2009)    Hypertension flowsheet reviewed?: Yes   Progress toward BP goal: Improved  Self-Management Support :   Personal Goals (by the next clinic visit) :     Personal A1C goal: 8   (08/15/2009)     Personal blood pressure goal: 130/80  (01/10/2009)     Personal LDL goal: 70  (03/30/2009)    Patient will work on the following items until the next clinic visit to reach self-care goals:     Medications and monitoring: take my medicines every day, bring all of my medications to every visit  (09/07/2009)     Eating: drink diet soda or water instead of juice or soda, eat more vegetables, use fresh or frozen vegetables, eat foods that are low in salt, eat baked foods instead of fried foods, eat fruit for snacks and desserts, limit or avoid alcohol  (09/07/2009)     Activity: take a 30 minute walk every day, take the stairs instead of the elevator, park at the far end of the parking lot  (09/07/2009)    Diabetes self-management support: Written self-care plan  (09/07/2009)   Diabetes care plan printed    Hypertension self-management support: Written self-care plan  (09/07/2009)   Hypertension self-care plan printed.    Lipid self-management support: Written self-care plan  (09/07/2009)   Lipid self-care plan printed.

## 2010-06-26 NOTE — Assessment & Plan Note (Signed)
Summary: dm, htn, tobacco cessation, elbow pain   Vital Signs:  Patient Profile:   60 Years Old Male Weight:      230.4 pounds Temp:     98.4 degrees F oral Pulse rate:   70 / minute BP sitting:   156 / 82  (left arm)  Pt. in pain?   no  Vitals Entered By: C.Mallory gtcc/sma              Is Patient Diabetic? Yes      Chief Complaint:  f/u DM , quitting tobacco, and elbow.  History of Present Illness: DM: Home sugars ranging: "not checking like he should", highest was 145 ( fasting in am). eating girl scout cookies,  mostly around 2-5.   Using meds: on 70/30, 50 in AM and 10 in PM, pt not willing to switch to  lantus or something that causes him to stick himself more than twice a day Leg/foot pain:no Last Creatinine0.98: Last HbA1C:7.4 in jan 09 Recent infections:no hypoglycemia- pt experiences tingling of his throat around lunch time when he has not eaten yet.  pt thinks this is related to hypoglycemia but has not checked his sugars.  weight: pt has lost 1 lb in last month, but over time has gained around 10lbs in last few years. walks at work, but does not get regular exercise.   elbow: doing better, mild pain, no pain today.  at end of the day, pain increases.  brace broke on patient.  celebrex improving pain, but taking 2 tablets a day of 200mg .    tobacco- pt wants to try the patches to quit smoking.  smokes 1/2 pack per day of lights.  wakes up and smokes first cigarette around 9AM.   htn- no significant change.     Current Allergies: ENALAPRIL MALEATE (ENALAPRIL MALEATE)  Past Medical History:    Nonobstructive coronary disease- Cardiologist Dr. Verl Blalock [facility])       Nuclear study low risk - 09/25/2005 EF 59%       Stress cardiolyte: neg for ischemia - 04/17/2004,        Cath/PTCA: EF 65%; stenting of mid-circumflex artery - 02/24/2002,       Sestamibi/treadmill cardiolyte: neg; EF 56% - 06/28/1999,     HTN    hx of hypokalemia    hyperlipidemia       Carotid  doppler: normal - 06/27/1998,     Type II DM with complications: Diabetic macular edema, nephropathy (proteinuria), retinopathy,     Disability claim 06/1998 after fall at work,     h/o EtOH withdrawal/seizures/pancreatitis in,     h/o gastric ulcer       Colonoscopy: colon polyps (benign) - 10/26/2002,        Upper GI neg 2000,          Hpylori neg 2000        allergic rhinitis,     presbyopia, hyperopia,     hx of sciatica  Past Surgical History:    Cath/PTCA: EF 65%; stenting of mid-circumflex artery - 02/24/2002    Epidural injection for back pain - 01/25/2001,       Family History:    6 siblings: DM, HTN, gout, migraines,     Breast CA 1st degree,     Diabetes 1st degree,     Father: CHF, dementia,     Mother: DM, HTN, CVA, breast CA,     neg for CAD    Social History:    -Quit  smoking in spring of 2004; -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 Lungs are clear to auscultation, no crackles or wheezes. Heart:     Normal rate and regular rhythm. S1 and S2 normal without gallop, murmur, click, rub or other extra sounds.    Impression & Recommendations:  Problem # 1:  DIABETES MELLITUS, II, COMPLICATIONS (A999333) Assessment: Deteriorated discussed at length about how 70/30 works, and that once he takes his insulin in the AM he is commited to eating both breakfast and lunch.  also, best time to eat sweets is either with breakfast or with supper, not at 2-5.  needs to keep better records of sugars.  will keep on same  regimen for now and follow up in 2 months and recheck HbA1c and BMET at next visit.  Pt going to foot doctor this week.   His updated medication list for this problem includes:    Hyzaar 100-25 Mg Tabs (Losartan potassium-hctz) .Marland Kitchen... Take 1 tablet by mouth once a day    Metformin Hcl 1000 Mg Tabs (Metformin hcl) .Marland Kitchen... 1 tablet twice a day for diabetes    Relion 70/30 70-30 % Susp (Insulin isophane & regular) ..... Inject 50 unit subcutaneously in am and 10 in pm.  disp qs    Aspirin 325 Mg Tabs (Aspirin) .Marland Kitchen... 1 tablet a day    Januvia 100 Mg Tabs (Sitagliptin phosphate) .Marland Kitchen... 1 tablet a day  Orders: [place]- Est  Level 4 [id number])   Problem # 2:  HYPERTENSION, BENIGN SYSTEMIC (ICD-401.1) Assessment: Unchanged continue current meds.  His updated medication list for this problem includes:    Hyzaar 100-25 Mg Tabs (Losartan potassium-hctz) .Marland Kitchen... Take 1 tablet by mouth once a day    Norvasc 10 Mg Tabs (Amlodipine besylate) .Marland Kitchen... Take 1/2  tablet by mouth once a day    Toprol Xl 200 Mg Tb24 (Metoprolol succinate) .Marland Kitchen... 1 tablet one time a day for blood pressure    Spironolactone 25 Mg Tabs (Spironolactone) .Marland Kitchen... 1 tablet 1 time a day for blood pressure and to raise potassium  Orders: [place]- Est  Level 4 [id number])   Problem # 3:  ELBOW PAIN (ICD-719.42) Assessment: Improved stable.  increased celebrex to 400mg  a day.  given Rx.  Orders: [place]- Est  Level 4 [id number])   Problem # 4:  TOBACCO ABUSE (ICD-305.1) Assessment: Improved wanting to quit.  given Rx for patches.   His updated medication list for this problem includes:    Eq Nicotine 14 Mg/24hr Pt24 (Nicotine) ..... Use 1 per day x 2 weeks, then go to 7 mg patch   Complete Medication List: 1)  Hyzaar 100-25 Mg Tabs (Losartan potassium-hctz) .... Take 1 tablet by mouth once a day 2)  Isosorbide Mononitrate Cr 60 Mg Tb24 (Isosorbide mononitrate) .... Take 1 tablet by mouth once a day 3)  Lipitor 10 Mg Tabs (Atorvastatin calcium) .... Take 1  tablet by mouth every night 4)  Metformin Hcl 1000 Mg Tabs (Metformin hcl) .Marland Kitchen.. 1 tablet twice a day for diabetes 5)  Norvasc 10 Mg Tabs (Amlodipine besylate) .... Take 1/2  tablet by mouth once a day 6)  Paxil 30 Mg Tabs (Paroxetine hcl) .... Take 1 tablet by mouth once a day 7)  Prevacid 30 Mg Cpdr (Lansoprazole) .... Take 1 capsule by mouth once a day 8)  Relion 70/30 70-30 % Susp (Insulin isophane & regular) .... Inject 50 unit subcutaneously in am and 10 in pm.  disp qs 9)  Toprol Xl 200 Mg Tb24 (Metoprolol succinate) .Marland Kitchen.. 1 tablet one time a day for blood pressure 10)  Bl Vitamin E Natural 400 Unit Caps (Vitamin e) .Marland Kitchen.. 1 tablet a day 11)  Vitamin C  .... 1 tablet a day 12)  Fish Oil Concentrate 1000 Mg Caps (Omega-3 fatty acids) .Marland Kitchen.. 1 tablet daily 13)  Alba-lybe Tabs (B complex vitamins) .Marland Kitchen.. 1 tablet a day 14)  Aspirin 325 Mg Tabs (Aspirin) .Marland Kitchen.. 1 tablet a day 15)  Spironolactone 25 Mg Tabs (Spironolactone) .Marland Kitchen.. 1 tablet 1 time a day for blood pressure and to raise potassium 16)  Januvia 100 Mg Tabs (Sitagliptin phosphate) .Marland Kitchen.. 1 tablet a day 17)  Voltaren 1 % Gel (Diclofenac sodium) .... Two times a day  to qid as needed to elbow for pain. 18)  Celebrex 400 Mg Caps (Celecoxib) .Marland Kitchen.. 1 tablet for pain a day 19)  Eq Nicotine 14 Mg/24hr Pt24 (Nicotine) .... Use 1 per day x 2 weeks, then go to 7 mg patch   Patient Instructions: 1)  Please schedule a follow-up appointment in 2 months dm htn.    Prescriptions: EQ NICOTINE 14 MG/24HR  PT24 (NICOTINE) use 1 per day x 2 weeks, then go to 7 mg patch  #14 x 1   Entered and Authorized by:   Marzetta Merino  MD   Signed by:   Marzetta Merino  MD on 07/21/2007   Method used:   Print then Give to Patient   [id number] CELEBREX 400 MG  CAPS (CELECOXIB) 1 tablet for pain a day  #30 x 2   Entered and Authorized by:   Marzetta Merino  MD   Signed by:   Marzetta Merino  MD on 07/21/2007   Method used:   Electronically sent to ...       [facility]   [address] [phone]*       [place]       [place], [place]  [address]       Ph: [phone] or [phone]       Fax: [phone]   RxID:   [id number] RELION 70/30 70-30 % SUSP (INSULIN ISOPHANE & REGULAR) Inject 50 unit subcutaneously in AM and 10 in PM.  disp qs  #1 x 6   Entered and Authorized by:   Marzetta Merino  MD   Signed by:   Marzetta Merino  MD on 07/21/2007   Method used:   Electronically sent to ...       [facility]  [address] [phone]*       [place]       [place], [place]  [address]       Ph: [phone] or [phone]       Fax: [phone]   RxID:   [id number]  ]

## 2010-06-26 NOTE — Progress Notes (Signed)
Summary: rx simvastatin sent in today  Phone Note Refill Request Call back at Home Phone [phone] Message from:  Patient  Refills Requested: Medication #1:  LASIX 20 MG TABS take 2 by mouth daily   Notes: if he can get 40mg  then it would be much cheaper for him Initial call taken by: Audie Clear,  April 12, 2010 4:00 PM    New/Updated Medications: SIMVASTATIN 20 MG TABS (SIMVASTATIN) 1 tab at bedtime Prescriptions: SIMVASTATIN 20 MG TABS (SIMVASTATIN) 1 tab at bedtime  #90 x 3   Entered by:   Julaine Hua, CMA   Authorized by:   Renella Cunas, MD, [facility]   Signed by:   Julaine Hua, CMA on 04/12/2010   Method used:   Electronically to        [facility] [phone]* (retail)       [address]       [place], [place]  [address]       Ph: [phone]       Fax: [phone]   RxID:   [id number]

## 2010-06-26 NOTE — Progress Notes (Signed)
Summary: Kidney stone (seen in ER at [facility])   Phone Note Other Incoming   Caller: [facility]  Summary of Call: Patient seen in [facility] on 09/04/09 - diagnosed with kidney stone. CT abd/pelvis w /2 mm right ureterovesicular junction region calculus producing mild fullness of the right intrarenal collecting system and ureter. ER physician wanted [place] to know about it so we could have patient closely followed, but he is OK with discharging him from the ER with pain control as he believes based on the location of the stone he should be able to pass it. Advised ER physician to have patient call triage line in the morning to make an appointment to be seen for close follow up. ER physician, patient agreeable to this plan of action.  Initial call taken by: Mariana Arn  MD,  September 04, 2009 6:56 PM     Appended Document: Kidney stone (seen in ER at [facility])  lm for pt to call back. will get him in schedule today

## 2010-06-28 NOTE — Assessment & Plan Note (Signed)
Summary: flu shot/bmc  Nurse Visit   Allergies: 1)  Enalapril Maleate (Enalapril Maleate)  Immunizations Administered:  Influenza Vaccine # 1:    Vaccine Type: Fluvax 3+    Site: right deltoid    Mfr: GlaxoSmithKline    Dose: 0.5 ml    Route: IM    Given by: Marcell Barlow RN    Exp. Date: 11/24/2010    Lot #: [id number]    VIS given: 12/19/09 version given June 11, 2010.  Orders Added: 1)  Flu Vaccine 72yrs + [id number] 2)  Admin 1st Vaccine [id number]

## 2010-06-28 NOTE — Progress Notes (Signed)
Summary: UPDATE PROBLEM LIST   

## 2010-08-03 ENCOUNTER — Ambulatory Visit: Payer: Self-pay | Admitting: Family Medicine

## 2010-08-03 ENCOUNTER — Ambulatory Visit (INDEPENDENT_AMBULATORY_CARE_PROVIDER_SITE_OTHER): Payer: Self-pay | Admitting: Family Medicine

## 2010-08-03 ENCOUNTER — Encounter: Payer: Self-pay | Admitting: Family Medicine

## 2010-08-03 DIAGNOSIS — I10 Essential (primary) hypertension: Secondary | ICD-10-CM

## 2010-08-03 DIAGNOSIS — F5232 Male orgasmic disorder: Secondary | ICD-10-CM

## 2010-08-03 DIAGNOSIS — E119 Type 2 diabetes mellitus without complications: Secondary | ICD-10-CM

## 2010-08-03 DIAGNOSIS — E7800 Pure hypercholesterolemia, unspecified: Secondary | ICD-10-CM

## 2010-08-03 LAB — POCT GLYCOSYLATED HEMOGLOBIN (HGB A1C): Hemoglobin A1C: 9.4

## 2010-08-03 NOTE — Progress Notes (Signed)
  Subjective:    Patient ID: Anthony [name], male    DOB: 07-01-1950, 60 y.o.   MRN: [id number]  HPI 1. Diabetes: Rx Metformin, Novolin 70/30: 50 in am, 10 in pm, ASA. He has not been checking his BG at home. Exercise = none. Diet: avoiding sugar and bread. Diabetes complications include diabetic macular edema, nephropathy (proteinuria), neuropathy, and retinopathy. He denies CP, DOE, N/V/D, HA, dizziness, increased numbness/tingling in his hands or feet.   2. HTN: Rx Metoprolol, Lasix, Imdur, Cozaar. Cardiologist is Dr. Verl Blalock with [facility]. Last myoview in 2009: EF 68%, no ischemia. Note: Amlodipine caused peripheral edema.  3. HLD: Rx Simvastatin. Denies myalgias.  4. ED: States the he was previously on Muse, though I cannot find this is the chart. Wants refill. Due to DM.   Review of Systems See HPI.    Objective:   Physical Exam  Vitals reviewed. Constitutional: He is oriented to person, place, and time. He appears well-developed and well-nourished.  Cardiovascular: Normal rate and regular rhythm.   Pulmonary/Chest: Effort normal and breath sounds normal.  Abdominal: Soft.  Neurological: He is alert and oriented to person, place, and time.  Skin: Skin is warm and dry.          Assessment & Plan:

## 2010-08-03 NOTE — Patient Instructions (Signed)
It was nice to see you today!

## 2010-08-08 ENCOUNTER — Encounter: Payer: Self-pay | Admitting: Family Medicine

## 2010-08-12 ENCOUNTER — Encounter: Payer: Self-pay | Admitting: Family Medicine

## 2010-08-12 NOTE — Assessment & Plan Note (Signed)
Patient not eligible for PDE-5. Rx vacuum device with instructions.

## 2010-08-12 NOTE — Assessment & Plan Note (Signed)
Due for FLP. Patient to come back fasting.

## 2010-08-12 NOTE — Assessment & Plan Note (Signed)
A1c improved. Patient has not been checking his BG. Discussed the dangers of taking insulin without checking BG. Patient to keep log to review at future visit. Advised eye check.

## 2010-08-12 NOTE — Assessment & Plan Note (Signed)
Patient to check BP at home and keep log for [place] to review.

## 2010-08-15 LAB — DIFFERENTIAL
Basophils Absolute: 0 10*3/uL (ref 0.0–0.1)
Basophils Relative: 0 % (ref 0–1)
Eosinophils Absolute: 0.1 10*3/uL (ref 0.0–0.7)
Eosinophils Relative: 1 % (ref 0–5)
Lymphocytes Relative: 20 % (ref 12–46)
Lymphs Abs: 1.6 10*3/uL (ref 0.7–4.0)
Monocytes Absolute: 0.6 10*3/uL (ref 0.1–1.0)
Monocytes Relative: 7 % (ref 3–12)
Neutro Abs: 5.9 10*3/uL (ref 1.7–7.7)
Neutrophils Relative %: 72 % (ref 43–77)

## 2010-08-15 LAB — URINALYSIS, ROUTINE W REFLEX MICROSCOPIC
Glucose, UA: 500 mg/dL — AB
Ketones, ur: NEGATIVE mg/dL
Leukocytes, UA: NEGATIVE
Nitrite: NEGATIVE
Protein, ur: 100 mg/dL — AB
Specific Gravity, Urine: 1.027 (ref 1.005–1.030)
Urobilinogen, UA: 1 mg/dL (ref 0.0–1.0)
pH: 5.5 (ref 5.0–8.0)

## 2010-08-15 LAB — POCT I-STAT, CHEM 8
BUN: 14 mg/dL (ref 6–23)
Calcium, Ion: 1.12 mmol/L (ref 1.12–1.32)
Chloride: 101 mEq/L (ref 96–112)
Creatinine, Ser: 1 mg/dL (ref 0.4–1.5)
Glucose, Bld: 254 mg/dL — ABNORMAL HIGH (ref 70–99)
HCT: 41 % (ref 39.0–52.0)
Hemoglobin: 13.9 g/dL (ref 13.0–17.0)
Potassium: 3.3 mEq/L — ABNORMAL LOW (ref 3.5–5.1)
Sodium: 139 mEq/L (ref 135–145)
TCO2: 28 mmol/L (ref 0–100)

## 2010-08-15 LAB — CBC
HCT: 38.6 % — ABNORMAL LOW (ref 39.0–52.0)
Hemoglobin: 12.7 g/dL — ABNORMAL LOW (ref 13.0–17.0)
MCHC: 32.9 g/dL (ref 30.0–36.0)
MCV: 86 fL (ref 78.0–100.0)
Platelets: 138 10*3/uL — ABNORMAL LOW (ref 150–400)
RBC: 4.49 MIL/uL (ref 4.22–5.81)
RDW: 14 % (ref 11.5–15.5)
WBC: 8.2 10*3/uL (ref 4.0–10.5)

## 2010-08-15 LAB — POCT CARDIAC MARKERS
CKMB, poc: 1.5 ng/mL (ref 1.0–8.0)
CKMB, poc: <1 ng/mL — ABNORMAL LOW (ref 1.0–8.0)
Myoglobin, poc: 73.9 ng/mL (ref 12–200)
Myoglobin, poc: 86 ng/mL (ref 12–200)
Troponin i, poc: <0.05 ng/mL (ref 0.00–0.09)
Troponin i, poc: <0.05 ng/mL (ref 0.00–0.09)

## 2010-08-15 LAB — URINE MICROSCOPIC-ADD ON

## 2010-08-15 LAB — GLUCOSE, CAPILLARY: Glucose-Capillary: 276 mg/dL — ABNORMAL HIGH (ref 70–99)

## 2010-08-15 LAB — HEMOCCULT GUIAC POC 1CARD (OFFICE): Fecal Occult Bld: NEGATIVE

## 2010-09-06 LAB — GLUCOSE, CAPILLARY: Glucose-Capillary: 417 mg/dL — ABNORMAL HIGH (ref 70–99)

## 2010-09-11 ENCOUNTER — Ambulatory Visit (HOSPITAL_COMMUNITY)
Admission: RE | Admit: 2010-09-11 | Discharge: 2010-09-11 | Disposition: A | Discharge status: Home / Self Care | Payer: PRIVATE HEALTH INSURANCE | Source: Ambulatory Visit | Attending: Family Medicine | Admitting: Family Medicine

## 2010-09-11 ENCOUNTER — Encounter: Payer: Self-pay | Admitting: Family Medicine

## 2010-09-11 ENCOUNTER — Ambulatory Visit (INDEPENDENT_AMBULATORY_CARE_PROVIDER_SITE_OTHER): Payer: PRIVATE HEALTH INSURANCE | Admitting: Family Medicine

## 2010-09-11 VITALS — BP 162/88 | Temp 98.2°F | Ht 72.0 in | Wt 210.0 lb

## 2010-09-11 DIAGNOSIS — L03119 Cellulitis of unspecified part of limb: Secondary | ICD-10-CM

## 2010-09-11 DIAGNOSIS — M2140 Flat foot [pes planus] (acquired), unspecified foot: Secondary | ICD-10-CM | POA: Insufficient documentation

## 2010-09-11 DIAGNOSIS — L02619 Cutaneous abscess of unspecified foot: Secondary | ICD-10-CM

## 2010-09-11 MED ORDER — SULFAMETHOXAZOLE-TRIMETHOPRIM 800-160 MG PO TABS: 2.0000 | ORAL_TABLET | Freq: Two times a day (BID) | ORAL | Status: AC

## 2010-09-11 MED ORDER — CIPROFLOXACIN HCL 500 MG PO TABS: 500.0000 mg | ORAL_TABLET | Freq: Two times a day (BID) | ORAL | Status: AC

## 2010-09-11 MED ORDER — CEFTRIAXONE SODIUM 250 MG IJ SOLR
250.0000 mg | Freq: Once | INTRAMUSCULAR | Status: AC
  Administered 2010-09-11: 250 mg via INTRAMUSCULAR

## 2010-09-11 NOTE — Progress Notes (Signed)
  Subjective:    Patient ID: Anthony [name], male    DOB: Jan 18, 1951, 60 y.o.   MRN: [id number]  HPI  1. LE Infection: Diabetic with self-reported BG range (200 - 250, due for A1c), presents with concern for infection of left second toe. He noticed pain and swelling in the toe 4 days ago, and endorses rapid worsening in the last 2 days. No fever/chills, rigors, N/V/D. He is able to wiggle his toes, has decreased ROM of ankle due to LE edema (slightly worse today than his chronic LE edema). He denies calf pain, pleuritic pain, SOB. He has treated with epsom baths and rubbing antibacterial ointment on the toe.  Review of Systems SEE HPI.    Objective:   Physical Exam  Constitutional: Vital signs are normal. He appears well-developed and well-nourished.  Non-toxic appearance.  Cardiovascular: Normal rate, regular rhythm and intact distal pulses.   Pulmonary/Chest: Effort normal and breath sounds normal.  Musculoskeletal:       Right lower leg: He exhibits edema.       Left lower leg: He exhibits edema.  Neurological: He is alert.  Skin:       Left Foot: Marked erythema of second toe with blistering. Extension of erythema to midfoot (marked with pen). No open lesions. Serous drainage from second toe.      Assessment & Plan:

## 2010-09-11 NOTE — Patient Instructions (Signed)
I am sending for an xray.

## 2010-09-11 NOTE — Assessment & Plan Note (Signed)
Serious concern for osteomyelitis based on exam. Discussed with patient. Xray obtained. Rocephin given in office. Cipro/Bactrim Rx to be started today. One blister was lanced in office. No pus drained. Good blood flow. Swabbed for culture. Patient to return tomorrow for re-evaluation. If not improving, patient is aware that he will need IV Abx and likely surgical consultation.

## 2010-09-12 ENCOUNTER — Ambulatory Visit (INDEPENDENT_AMBULATORY_CARE_PROVIDER_SITE_OTHER): Payer: PRIVATE HEALTH INSURANCE | Admitting: Family Medicine

## 2010-09-12 ENCOUNTER — Encounter: Payer: Self-pay | Admitting: Family Medicine

## 2010-09-12 DIAGNOSIS — I10 Essential (primary) hypertension: Secondary | ICD-10-CM

## 2010-09-12 DIAGNOSIS — E118 Type 2 diabetes mellitus with unspecified complications: Secondary | ICD-10-CM

## 2010-09-12 DIAGNOSIS — L03119 Cellulitis of unspecified part of limb: Secondary | ICD-10-CM

## 2010-09-12 DIAGNOSIS — E1165 Type 2 diabetes mellitus with hyperglycemia: Secondary | ICD-10-CM

## 2010-09-12 DIAGNOSIS — IMO0002 Reserved for concepts with insufficient information to code with codable children: Secondary | ICD-10-CM

## 2010-09-12 DIAGNOSIS — L02619 Cutaneous abscess of unspecified foot: Secondary | ICD-10-CM

## 2010-09-12 NOTE — Progress Notes (Signed)
Done, does the patient know to come in or do I need to call him? Thanks, [name]

## 2010-09-12 NOTE — Assessment & Plan Note (Signed)
Will titrate 70/30 to 60 units in am and 15 units in pm in setting of ongoing infection. Given blood sugars over last 24 hours, he should be able to tolerate this well. Did discuss hypoglycemic red flags.

## 2010-09-12 NOTE — Assessment & Plan Note (Signed)
Wil increase cozaar to max dosing

## 2010-09-12 NOTE — Patient Instructions (Signed)
It was good to see you today. Your foot is doing better  Continue with the antibiotics We would like to get an MRI of your foot. However we understand you have some concerns because of cost. However, we may have to do this at some point at minimum if your foot is not improving. I think we can hold off on getting it today though, because your foot is getting better. For your blood sugars take 60 units of 70/30 during the day and 15 units at night. If your blood sugars go too low (<80) then go back to regular dosing Also increase your cozaar to tablets daily for your blood pressure If you develop any fever, nausea, leg pain, or any other concerning symptoms, please give [place] a call.  God Bless,  Shanda Howells MD

## 2010-09-12 NOTE — Assessment & Plan Note (Signed)
Clinically improved. Erythema is approx 1 cm below drawn margin. Toe still actively draining. + Marked swelling and erythema. 3-4 mLs of pus (exudative fluid) expressed from 2nd toe. Discussed overall treatment course at length with pt in terms of inpt management vs. MRI at minimum to assess for osteomyelitic changes. Pt still not agreeable to more aggressive management. Is considering though. Will continue with current treatment course.

## 2010-09-12 NOTE — Progress Notes (Signed)
  Subjective:    Patient ID: Anthony [name], male    DOB: 09-Mar-1951, 60 y.o.   MRN: [id number]  HPI Pt here for follow up on foot cellulitis/?osteomyelitis. Seen yesterday. Given IV rocephin x 1. Placed on septra and cipro. Declined inpt admission/MRI secondary to cost concerns.  Today- Feeling better per pt. Pain much improved per pt. Still with intermittent swelling. No fevers, nausea, vomiting. Compliant with oral antibiotics. Still taking 70/30 and metformin. Blood sugars in 200s-250s per pt. No polyuria/polydypsia. No other systemic sxs per pt. Is keeping foot elevated approx >20 hours per day.    Review of Systems See HPI     Objective:   Physical Exam Constitutional: Vital signs are normal. He appears well-developed and well-nourished. Non-toxic appearance.  Cardiovascular: Normal rate, regular rhythm and intact distal pulses.  Pulmonary/Chest: Effort normal and breath sounds normal.  Musculoskeletal:  Right lower leg: He exhibits edema.  Left lower leg: He exhibits edema.  Neurological: He is alert.  Skin:  Left Foot: Marked erythema of second toe with blistering. Extension of erythema to midfoot (marked with pen). No open lesions. Serous drainage from second toe.     Assessment & Plan:  Cellulitis- Clinically improved. Erythema is approx 1 cm below drawn margin. Toe still actively draining. + Marked swelling and erythema. 3-4 mLs of pus (exudative fluid) expressed from 2nd toe. Discussed overall treatment course at length with pt in terms of inpt management vs. MRI at minimum to assess for osteomyelitic changes. Pt still not agreeable to more aggressive management. Is considering though. Will continue with current treatment course.  DM- Will titrate 70/30 to 60 units in am and 15 units in pm in setting of ongoing infection. Given blood sugars over last 24 hours, he should be able to tolerate this well. Did discuss hypoglycemic red flags.  HTN- Wil increase cozaar to max dosing.

## 2010-09-13 ENCOUNTER — Ambulatory Visit (HOSPITAL_COMMUNITY)
Admission: RE | Admit: 2010-09-13 | Discharge: 2010-09-13 | Disposition: A | Discharge status: Home / Self Care | Payer: PRIVATE HEALTH INSURANCE | Source: Ambulatory Visit | Attending: Family Medicine | Admitting: Family Medicine

## 2010-09-13 ENCOUNTER — Ambulatory Visit: Payer: PRIVATE HEALTH INSURANCE | Admitting: Family Medicine

## 2010-09-13 ENCOUNTER — Telehealth: Payer: Self-pay | Admitting: Family Medicine

## 2010-09-13 ENCOUNTER — Ambulatory Visit (INDEPENDENT_AMBULATORY_CARE_PROVIDER_SITE_OTHER): Payer: PRIVATE HEALTH INSURANCE | Admitting: Family Medicine

## 2010-09-13 DIAGNOSIS — E1165 Type 2 diabetes mellitus with hyperglycemia: Secondary | ICD-10-CM

## 2010-09-13 DIAGNOSIS — E118 Type 2 diabetes mellitus with unspecified complications: Secondary | ICD-10-CM

## 2010-09-13 DIAGNOSIS — M7989 Other specified soft tissue disorders: Secondary | ICD-10-CM | POA: Insufficient documentation

## 2010-09-13 DIAGNOSIS — L02619 Cutaneous abscess of unspecified foot: Secondary | ICD-10-CM | POA: Insufficient documentation

## 2010-09-13 DIAGNOSIS — L03119 Cellulitis of unspecified part of limb: Secondary | ICD-10-CM

## 2010-09-13 DIAGNOSIS — E119 Type 2 diabetes mellitus without complications: Secondary | ICD-10-CM | POA: Insufficient documentation

## 2010-09-13 DIAGNOSIS — IMO0002 Reserved for concepts with insufficient information to code with codable children: Secondary | ICD-10-CM

## 2010-09-13 DIAGNOSIS — I10 Essential (primary) hypertension: Secondary | ICD-10-CM

## 2010-09-13 DIAGNOSIS — L03039 Cellulitis of unspecified toe: Secondary | ICD-10-CM | POA: Insufficient documentation

## 2010-09-13 LAB — BASIC METABOLIC PANEL
BUN: 5 mg/dL — ABNORMAL LOW (ref 6–23)
CO2: 28 mEq/L (ref 19–32)
Calcium: 8.9 mg/dL (ref 8.4–10.5)
Chloride: 104 mEq/L (ref 96–112)
Creat: 1.09 mg/dL (ref 0.40–1.50)
Glucose, Bld: 118 mg/dL — ABNORMAL HIGH (ref 70–99)
Potassium: 4 mEq/L (ref 3.5–5.3)
Sodium: 139 mEq/L (ref 135–145)

## 2010-09-13 MED ORDER — GADOBENATE DIMEGLUMINE 529 MG/ML IV SOLN
18.0000 mL | Freq: Once | INTRAVENOUS | Status: AC | PRN
  Administered 2010-09-13: 18 mL via INTRAVENOUS

## 2010-09-13 MED ORDER — INSULIN NPH ISOPHANE & REGULAR (70-30) 100 UNIT/ML ~~LOC~~ SUSP: SUBCUTANEOUS | Status: DC

## 2010-09-13 MED ORDER — AMLODIPINE BESYLATE 10 MG PO TABS: 10.0000 mg | ORAL_TABLET | Freq: Every day | ORAL | Status: DC

## 2010-09-13 NOTE — Assessment & Plan Note (Signed)
Given lack of significant progression of erythema in setting of baseline vascular disease, there is high concern for bone involvement. Pt agreeable to MRI. Will obtain to rule out osteo. Pending results, pt may need admission for IV abx.  If negative pt, will need to be seen in am for follow up visit.

## 2010-09-13 NOTE — Telephone Encounter (Signed)
Called to follow up with patient about MRI. I have tried to contact on call radiologist for MSK/MRI readings @ [phone] x 2 tonight with no call back. Left VM for pt that MRI reading has not been completed and to come in in am for follow up visit. Discussed infectious red flags for ED evaluation.

## 2010-09-13 NOTE — Assessment & Plan Note (Signed)
Will continue with cozaar. Will add on norvasc.

## 2010-09-13 NOTE — Patient Instructions (Signed)
It was good seeing you again I am sending you for an MRI of the foot to rule out infection in the bone I am starting you on norvasc for your blood pressures; I am also checking your kidney function. Make sure to check your blood sugars on the increased dose of 70/30 I will call you with the results of the MRI. If it is positive for infectionof the bone you will need IV antibiotics. If it is negative. Come back in tomorrow for re-evalaution. Otherwise call with any questions or if you do not feel well. God Bless,  Shanda Howells MD

## 2010-09-13 NOTE — Assessment & Plan Note (Signed)
Will continue with 70/30 60am-15pm. Instructed to continue regimen and check blood sugars.

## 2010-09-13 NOTE — Progress Notes (Signed)
  Subjective:    Patient ID: Anthony [name], male    DOB: 09/07/1950, 60 y.o.   MRN: [id number]  HPI  Pt here for follow up on foot cellulitis/?osteomyelitis. Seen yesterday.Day 3 cipro/septra.  Declined inpt admission/MRI secondary to cost concerns.  Today-Essentially unchanged from yesterday.  Pain much improved per pt. Swelling stable. No fevers, nausea, vomiting. Has been taking increased dose of 70/30. Has not been checking blood sugars. No reports of dizziness/weakness. Also taking increased dose of cozaar. No other systemic sxs per pt. Is keeping foot elevated.  Review of Systems  See HPI   Objective:   Physical Exam  Constitutional: Vital signs are normal. He appears well-developed and well-nourished. Non-toxic appearance.  Cardiovascular: Normal rate, regular rhythm and intact distal pulses.  Pulmonary/Chest: Effort normal and breath sounds normal.  Musculoskeletal:  Right lower leg: He exhibits edema.  Left lower leg: He exhibits edema.  Neurological: He is alert.  Skin:  Left Foot: Marked erythema of second toe with blistering. Extension of erythema to midfoot (marked with pen) moderate improvement. + open lesion on 2nd toe w/ Serous drainage from second toe.     Review of Systems     Physical Exam  Musculoskeletal:       Feet:          Assessment & Plan:  Cellulititis- Given lack of significant progression of erythema in setting of baseline vascular disease, there is high concern for bone involvement. Pt agreeable to MRI. Will obtain to rule out osteo. Pending results, pt may need admission for IV abx.  If negative pt, will need to be seen in am for follow up visit.  HTN- Will continue with cozaar. Will add on norvasc.  DM- Will continue with 70/30 60am-15pm. Instructed to continue regimen and check blood sugars.

## 2010-09-14 ENCOUNTER — Inpatient Hospital Stay (HOSPITAL_COMMUNITY)
Admission: EM | Admit: 2010-09-14 | Discharge: 2010-09-18 | DRG: 617 | Disposition: A | Discharge status: Home / Self Care | Payer: Self-pay | Source: Ambulatory Visit | Attending: Family Medicine | Admitting: Family Medicine

## 2010-09-14 ENCOUNTER — Telehealth: Payer: Self-pay | Admitting: *Deleted

## 2010-09-14 ENCOUNTER — Telehealth: Payer: Self-pay | Admitting: Family Medicine

## 2010-09-14 DIAGNOSIS — F3289 Other specified depressive episodes: Secondary | ICD-10-CM | POA: Diagnosis present

## 2010-09-14 DIAGNOSIS — I739 Peripheral vascular disease, unspecified: Secondary | ICD-10-CM

## 2010-09-14 DIAGNOSIS — E1165 Type 2 diabetes mellitus with hyperglycemia: Secondary | ICD-10-CM | POA: Diagnosis present

## 2010-09-14 DIAGNOSIS — E11311 Type 2 diabetes mellitus with unspecified diabetic retinopathy with macular edema: Secondary | ICD-10-CM | POA: Diagnosis present

## 2010-09-14 DIAGNOSIS — E1129 Type 2 diabetes mellitus with other diabetic kidney complication: Secondary | ICD-10-CM | POA: Diagnosis present

## 2010-09-14 DIAGNOSIS — R609 Edema, unspecified: Secondary | ICD-10-CM | POA: Diagnosis present

## 2010-09-14 DIAGNOSIS — M869 Osteomyelitis, unspecified: Secondary | ICD-10-CM | POA: Diagnosis present

## 2010-09-14 DIAGNOSIS — E1139 Type 2 diabetes mellitus with other diabetic ophthalmic complication: Secondary | ICD-10-CM | POA: Diagnosis present

## 2010-09-14 DIAGNOSIS — IMO0002 Reserved for concepts with insufficient information to code with codable children: Principal | ICD-10-CM | POA: Diagnosis present

## 2010-09-14 DIAGNOSIS — E118 Type 2 diabetes mellitus with unspecified complications: Secondary | ICD-10-CM

## 2010-09-14 DIAGNOSIS — N058 Unspecified nephritic syndrome with other morphologic changes: Secondary | ICD-10-CM | POA: Diagnosis present

## 2010-09-14 DIAGNOSIS — E1149 Type 2 diabetes mellitus with other diabetic neurological complication: Secondary | ICD-10-CM | POA: Diagnosis present

## 2010-09-14 DIAGNOSIS — E11319 Type 2 diabetes mellitus with unspecified diabetic retinopathy without macular edema: Secondary | ICD-10-CM | POA: Diagnosis present

## 2010-09-14 DIAGNOSIS — M8610 Other acute osteomyelitis, unspecified site: Secondary | ICD-10-CM

## 2010-09-14 DIAGNOSIS — G8929 Other chronic pain: Secondary | ICD-10-CM | POA: Diagnosis present

## 2010-09-14 DIAGNOSIS — Z87891 Personal history of nicotine dependence: Secondary | ICD-10-CM

## 2010-09-14 DIAGNOSIS — I2510 Atherosclerotic heart disease of native coronary artery without angina pectoris: Secondary | ICD-10-CM | POA: Diagnosis present

## 2010-09-14 DIAGNOSIS — Z7982 Long term (current) use of aspirin: Secondary | ICD-10-CM

## 2010-09-14 DIAGNOSIS — N3941 Urge incontinence: Secondary | ICD-10-CM | POA: Diagnosis present

## 2010-09-14 DIAGNOSIS — M9080 Osteopathy in diseases classified elsewhere, unspecified site: Secondary | ICD-10-CM | POA: Diagnosis present

## 2010-09-14 DIAGNOSIS — E785 Hyperlipidemia, unspecified: Secondary | ICD-10-CM | POA: Diagnosis present

## 2010-09-14 DIAGNOSIS — Z794 Long term (current) use of insulin: Secondary | ICD-10-CM

## 2010-09-14 DIAGNOSIS — I10 Essential (primary) hypertension: Secondary | ICD-10-CM | POA: Diagnosis present

## 2010-09-14 DIAGNOSIS — L03039 Cellulitis of unspecified toe: Secondary | ICD-10-CM | POA: Diagnosis present

## 2010-09-14 DIAGNOSIS — E1142 Type 2 diabetes mellitus with diabetic polyneuropathy: Secondary | ICD-10-CM | POA: Diagnosis present

## 2010-09-14 DIAGNOSIS — Z79899 Other long term (current) drug therapy: Secondary | ICD-10-CM

## 2010-09-14 DIAGNOSIS — L97509 Non-pressure chronic ulcer of other part of unspecified foot with unspecified severity: Secondary | ICD-10-CM | POA: Diagnosis present

## 2010-09-14 DIAGNOSIS — L02619 Cutaneous abscess of unspecified foot: Secondary | ICD-10-CM | POA: Diagnosis present

## 2010-09-14 DIAGNOSIS — F329 Major depressive disorder, single episode, unspecified: Secondary | ICD-10-CM | POA: Diagnosis present

## 2010-09-14 DIAGNOSIS — M545 Low back pain, unspecified: Secondary | ICD-10-CM | POA: Diagnosis present

## 2010-09-14 DIAGNOSIS — K219 Gastro-esophageal reflux disease without esophagitis: Secondary | ICD-10-CM | POA: Diagnosis present

## 2010-09-14 LAB — DIFFERENTIAL
Basophils Absolute: 0 10*3/uL (ref 0.0–0.1)
Basophils Relative: 0 % (ref 0–1)
Eosinophils Absolute: 0.4 10*3/uL (ref 0.0–0.7)
Eosinophils Relative: 5 % (ref 0–5)
Lymphocytes Relative: 19 % (ref 12–46)
Lymphs Abs: 1.7 10*3/uL (ref 0.7–4.0)
Monocytes Absolute: 0.5 10*3/uL (ref 0.1–1.0)
Monocytes Relative: 6 % (ref 3–12)
Neutro Abs: 6 10*3/uL (ref 1.7–7.7)
Neutrophils Relative %: 70 % (ref 43–77)

## 2010-09-14 LAB — BASIC METABOLIC PANEL
BUN: 9 mg/dL (ref 6–23)
CO2: 27 mEq/L (ref 19–32)
Calcium: 9.6 mg/dL (ref 8.4–10.5)
Chloride: 103 mEq/L (ref 96–112)
Creatinine, Ser: 1.18 mg/dL (ref 0.4–1.5)
GFR calc Af Amer: >60 mL/min (ref >60)
GFR calc non Af Amer: >60 mL/min (ref >60)
Glucose, Bld: 426 mg/dL — ABNORMAL HIGH (ref 70–99)
Potassium: 4.4 mEq/L (ref 3.5–5.1)
Sodium: 137 mEq/L (ref 135–145)

## 2010-09-14 LAB — WOUND CULTURE
Gram Stain: NONE SEEN
Gram Stain: NONE SEEN
Gram Stain: NONE SEEN

## 2010-09-14 LAB — CBC
HCT: 39.2 % (ref 39.0–52.0)
Hemoglobin: 13.6 g/dL (ref 13.0–17.0)
MCH: 28.8 pg (ref 26.0–34.0)
MCHC: 34.7 g/dL (ref 30.0–36.0)
MCV: 83.1 fL (ref 78.0–100.0)
Platelets: 145 10*3/uL — ABNORMAL LOW (ref 150–400)
RBC: 4.72 MIL/uL (ref 4.22–5.81)
RDW: 13.4 % (ref 11.5–15.5)
WBC: 8.7 10*3/uL (ref 4.0–10.5)

## 2010-09-14 LAB — GLUCOSE, CAPILLARY
Glucose-Capillary: 486 mg/dL — ABNORMAL HIGH (ref 70–99)
Glucose-Capillary: 179 mg/dL — ABNORMAL HIGH (ref 70–99)

## 2010-09-14 NOTE — Telephone Encounter (Signed)
Called to follow up with patient about results of MRI.  Report showing osteomyelitis in the 2nd toe. Instructed pt that he would need admission to hospital for IV antibiotics or at minimum clinical visit today. Gave pt my pager number to contact directly as well as [place] number for follow up.

## 2010-09-14 NOTE — Telephone Encounter (Signed)
Told him md wants him to go to ED to be admitted for tx of his toe. He agreed & will go

## 2010-09-15 LAB — COMPREHENSIVE METABOLIC PANEL
ALT: 25 U/L (ref 0–53)
AST: 24 U/L (ref 0–37)
Albumin: 3 g/dL — ABNORMAL LOW (ref 3.5–5.2)
Alkaline Phosphatase: 87 U/L (ref 39–117)
BUN: 8 mg/dL (ref 6–23)
CO2: 27 mEq/L (ref 19–32)
Calcium: 9 mg/dL (ref 8.4–10.5)
Chloride: 107 mEq/L (ref 96–112)
Creatinine, Ser: 0.97 mg/dL (ref 0.4–1.5)
GFR calc Af Amer: >60 mL/min (ref >60)
GFR calc non Af Amer: >60 mL/min (ref >60)
Glucose, Bld: 287 mg/dL — ABNORMAL HIGH (ref 70–99)
Potassium: 4.1 mEq/L (ref 3.5–5.1)
Sodium: 139 mEq/L (ref 135–145)
Total Bilirubin: 0.7 mg/dL (ref 0.3–1.2)
Total Protein: 6 g/dL (ref 6.0–8.3)

## 2010-09-15 LAB — DIFFERENTIAL
Basophils Absolute: 0 10*3/uL (ref 0.0–0.1)
Basophils Relative: 0 % (ref 0–1)
Eosinophils Absolute: 0.7 10*3/uL (ref 0.0–0.7)
Eosinophils Relative: 9 % — ABNORMAL HIGH (ref 0–5)
Lymphocytes Relative: 25 % (ref 12–46)
Lymphs Abs: 1.9 10*3/uL (ref 0.7–4.0)
Monocytes Absolute: 0.6 10*3/uL (ref 0.1–1.0)
Monocytes Relative: 7 % (ref 3–12)
Neutro Abs: 4.6 10*3/uL (ref 1.7–7.7)
Neutrophils Relative %: 59 % (ref 43–77)

## 2010-09-15 LAB — GLUCOSE, CAPILLARY
Glucose-Capillary: 296 mg/dL — ABNORMAL HIGH (ref 70–99)
Glucose-Capillary: 288 mg/dL — ABNORMAL HIGH (ref 70–99)
Glucose-Capillary: 260 mg/dL — ABNORMAL HIGH (ref 70–99)
Glucose-Capillary: 309 mg/dL — ABNORMAL HIGH (ref 70–99)
Glucose-Capillary: 210 mg/dL — ABNORMAL HIGH (ref 70–99)

## 2010-09-15 LAB — CBC
HCT: 37.9 % — ABNORMAL LOW (ref 39.0–52.0)
Hemoglobin: 12.9 g/dL — ABNORMAL LOW (ref 13.0–17.0)
MCH: 28.1 pg (ref 26.0–34.0)
MCHC: 34 g/dL (ref 30.0–36.0)
MCV: 82.6 fL (ref 78.0–100.0)
Platelets: 138 10*3/uL — ABNORMAL LOW (ref 150–400)
RBC: 4.59 MIL/uL (ref 4.22–5.81)
RDW: 13.4 % (ref 11.5–15.5)
WBC: 7.7 10*3/uL (ref 4.0–10.5)

## 2010-09-15 LAB — HEMOGLOBIN A1C
Hgb A1c MFr Bld: 11.3 % — ABNORMAL HIGH (ref <5.7)
Mean Plasma Glucose: 278 mg/dL — ABNORMAL HIGH (ref <117)

## 2010-09-16 LAB — BASIC METABOLIC PANEL
BUN: 9 mg/dL (ref 6–23)
CO2: 28 mEq/L (ref 19–32)
Calcium: 9.1 mg/dL (ref 8.4–10.5)
Chloride: 102 mEq/L (ref 96–112)
Creatinine, Ser: 1.15 mg/dL (ref 0.4–1.5)
GFR calc Af Amer: >60 mL/min (ref >60)
GFR calc non Af Amer: >60 mL/min (ref >60)
Glucose, Bld: 231 mg/dL — ABNORMAL HIGH (ref 70–99)
Potassium: 4.2 mEq/L (ref 3.5–5.1)
Sodium: 138 mEq/L (ref 135–145)

## 2010-09-16 LAB — CBC
HCT: 40.5 % (ref 39.0–52.0)
Hemoglobin: 13.9 g/dL (ref 13.0–17.0)
MCH: 28.4 pg (ref 26.0–34.0)
MCHC: 34.3 g/dL (ref 30.0–36.0)
MCV: 82.7 fL (ref 78.0–100.0)
Platelets: 153 10*3/uL (ref 150–400)
RBC: 4.9 MIL/uL (ref 4.22–5.81)
RDW: 13.3 % (ref 11.5–15.5)
WBC: 11.9 10*3/uL — ABNORMAL HIGH (ref 4.0–10.5)

## 2010-09-16 LAB — GLUCOSE, CAPILLARY
Glucose-Capillary: 340 mg/dL — ABNORMAL HIGH (ref 70–99)
Glucose-Capillary: 223 mg/dL — ABNORMAL HIGH (ref 70–99)
Glucose-Capillary: 114 mg/dL — ABNORMAL HIGH (ref 70–99)
Glucose-Capillary: 319 mg/dL — ABNORMAL HIGH (ref 70–99)

## 2010-09-17 LAB — BASIC METABOLIC PANEL
BUN: 8 mg/dL (ref 6–23)
CO2: 28 mEq/L (ref 19–32)
Calcium: 8.7 mg/dL (ref 8.4–10.5)
Chloride: 104 mEq/L (ref 96–112)
Creatinine, Ser: 1.2 mg/dL (ref 0.4–1.5)
GFR calc Af Amer: >60 mL/min (ref >60)
GFR calc non Af Amer: >60 mL/min (ref >60)
Glucose, Bld: 285 mg/dL — ABNORMAL HIGH (ref 70–99)
Potassium: 4 mEq/L (ref 3.5–5.1)
Sodium: 138 mEq/L (ref 135–145)

## 2010-09-17 LAB — CBC
HCT: 39.6 % (ref 39.0–52.0)
Hemoglobin: 13.5 g/dL (ref 13.0–17.0)
MCH: 28.2 pg (ref 26.0–34.0)
MCHC: 34.1 g/dL (ref 30.0–36.0)
MCV: 82.8 fL (ref 78.0–100.0)
Platelets: 143 10*3/uL — ABNORMAL LOW (ref 150–400)
RBC: 4.78 MIL/uL (ref 4.22–5.81)
RDW: 13.2 % (ref 11.5–15.5)
WBC: 10 10*3/uL (ref 4.0–10.5)

## 2010-09-17 LAB — GLUCOSE, CAPILLARY
Glucose-Capillary: 305 mg/dL — ABNORMAL HIGH (ref 70–99)
Glucose-Capillary: 300 mg/dL — ABNORMAL HIGH (ref 70–99)
Glucose-Capillary: 108 mg/dL — ABNORMAL HIGH (ref 70–99)
Glucose-Capillary: 142 mg/dL — ABNORMAL HIGH (ref 70–99)

## 2010-09-17 NOTE — H&P (Signed)
NAME:  Anthony [name], Anthony [name]               ACCOUNT NO.:  [id number]  MEDICAL RECORD NO.:  [id number]           PATIENT TYPE:  I  LOCATION:  5007                         FACILITY:  [facility]  PHYSICIAN:  [name]. [name], M.D.DATE OF BIRTH:  Sep 02, 1950  DATE OF ADMISSION:  09/14/2010 DATE OF DISCHARGE:                             HISTORY & PHYSICAL   PRIMARY CARE PHYSICIAN:  Briscoe Deutscher, MD at [facility]:  Left foot cellulitis complicated by left toe osteomyelitis.  HISTORY OF PRESENT ILLNESS:  This is a 60 year old male with known uncontrolled type 2 diabetes who presented to the family practice clinic on September 11, 2010, with lower extremity cellulitis and concern for osteomyelitis.  At the time of presentation, the patient declined admission to the hospital due to financial concerns.  Per the patient, he had had approximately a 1-week history of some pain and discomfort inhis left foot, and the patient decided to have his toe and foot be seen when it began swelling more and began having discoloration.  On that day, he was initially seen at [facility], the pain in his left foot had resolved spontaneously.  Once seen at the [facility], his left toe, which appeared to have a type of blister was incised and drained.  A wound culture was sent, and the patient was started on Cipro and Septra.  The patient was seen subsequently each day in clinic and had little to no improvement on p.o. antibiotics.  His wound culture grew moderate staph aureus and moderate GBS, which were pansensitive except for resistant to penicillin.  After the patient failed to improve on multiple days of antibiotics, he was encouraged to have an MRI done on September 13, 2010, the patient went to the hospital and had an MRI, which showed regular this is the second toe with edema and enhancement throughout the middle phalanx and distal half of the proximal  phalanx consistent with osteomyelitis.  No abscess was seen. The patient was called this morning when the MRI read was available, and the patient agreed to go to the emergency department for admission.  The patient remained without any systemic sign or symptoms including no fever, chills, nausea, vomiting, or fatigue.  PAST MEDICAL HISTORY: 1. Uncontrolled type 2 diabetes. 2. Diabetic neuropathy. 3. Diabetic retinopathy. 4. Diabetic nephropathy. 5. Diabetes-induced macular edema. 6. Hyperlipidemia. 7. Depression. 8. Hypertension. 9. CAD. 10.PVD. 11.GERD. 12.Chronic lower extremity edema. 13.Mild urge incontinence. 14.Erectile dysfunction. 15.History of tobacco abuse. 16.Nephrolithiasis. 17.History of chronic low back pain. 18.History of gastric ulcer. 19.History of alcohol withdrawal including seizures and pancreatitis.  ALLERGIES:  ENALAPRIL, reaction is cough.  HOME MEDICATIONS: 1. Amlodipine 10 mg p.o. daily. 2. Aspirin 81 mg p.o. daily. 3. Cipro 500 mg b.i.d. 4. Lasix 40 mg daily. 5. Norco 10/325 q.i.d. p.r.n. 6. Insulin NPH 70/30 60 units q.a.m., 15 units q.p.m. 7. Imdur 60 mg p.o. daily 8. Prevacid 30 mg p.o. daily. 9. Cozaar 50 mg p.o. daily. 10.Metformin 1000 mg p.o. b.i.d. 11.Lopressor 100 mg b.i.d. 12.Multivitamin daily. 13.Nitrostat 0.4 mg sublingual q.5 minutes p.r.n. up to 3 times.  14.Fish oil 1000 mg daily. 15.OxyContin 10 mg p.o. t.i.d. 16.Paxil 20 mg p.o. daily. 17.K-Dur 10 mEq p.o. daily. 18.Simvastatin 20 mg p.o. at bedtime. 19.Septra 800/160 2 tablets p.o. b.i.d.  SOCIAL HISTORY:  The patient lives in [place] with his wife.  He quit using tobacco approximately 4 months ago.  The patient denies any alcohol or drug use.  FAMILY HISTORY:  Noncontributory.  PHYSICAL EXAMINATION:  VITAL SIGNS:  Temperature 98.3, blood pressure 169/79, pulse 56, respirations 18, pO2 97% on room air. GENERAL:  NAD, sitting in bed, asking if he really needs to  be admitted. HEENT:  Moist mucous membranes.  Extraocular was intact.  PERRLA.  No pharyngeal erythema or exudate.  Poor dentition with teeth missing.  No obvious abscesses. NECK:  Supple without lymphadenopathy. CARDIOVASCULAR:  Regular rate and rhythm.  No murmur, 2+ pedal pulses bilaterally. PULMONARY:  Clear to auscultation bilaterally.  No wheezes, crackles, or rhonchi. ABDOMEN:  Soft, nontender, positive bowel sounds. EXTREMITIES:  Bilateral lower extremity 1 to 2+ pitting edema up to the patient's mid calf.  Left ankle and foot more edematous than right. Left second toe with open lesion and serosanguineous drainage.  No frank pus.  Erythema surrounding lesion on second toe.  Left foot is nontender to palpation.  Left foot also with surrounding edema over the dorsal foot within the margins marked from clinic.  Toenails on bilateral feet long and poorly kept.  LABORATORY DATA AND STUDIES:  CBC with diff showing white blood cell count of 8.7, hemoglobin 13.6, platelets 145, 70% neutrophil, 19% lymphocyte with ANC 6.0.  BMET 137/4.4/103/27/9/1.18/426, calcium of 9.6.  MRI of the left foot showing cellulitis of the second toe with edema and enhancement throughout the middle phalanx and distal one-half of proximal phalanx compatible with osteomyelitis.  No abscess noted.  ASSESSMENT AND PLAN:  This is a 60 year old male with history of diabetes, peripheral vascular disease, history of tobacco use, presenting with a left toe cellulitis and MRI concerning for osteomyelitis. 1. Infectious disease/cellulitis/osteomyelitis.  MRI concerning for     osteomyelitis.  The patient is day #4 p.o. antibiotics Septra/Cipro     on an outpatient setting for known cellulitis.  We will consult     Orthopedic Surgery as amputation will likely be needed.  Dr. [name]     was contacted.  We will start IV antibiotics including vancomycin     and Zosyn for broad-spectrum coverage.  We will also make the      patient n.p.o. until Orthopedic Surgery has seen in case they     decide that the patient needs to be taken to the OR this evening,     and we will also hydrate the patient with normal saline at 100 mL     per hour while n.p.o.  We will check a CBC, CMP, and draw blood     cultures, and we will have the patient keep his left leg elevated     and have him on bedrest with bathroom privileges only. 2. Type 2 diabetes.  Diabetes is now well controlled.  The patient     endorses not checking CBGs at home.  He is also due for an A1c, so     this will be checked while in-house.  The patient's uncontrolled     diabetes is also likely contributing to his current infection.  We     will hold the patient's home metformin while he is n.p.o. and put  on sensitive sliding scale insulin.  The patient's home regimen in     NPH (70/30) 60 units q. morning, 15 units q. evening. 3. Cardiovascular.  Significant history includes hypertension,     coronary artery disease, peripheral vascular disease, NHLD.  We     will continue the patient's home amlodipine, Lasix, Imdur, Cozaar,     Lopressor, aspirin, Zocor, and fish oil.  We will have routine     blood pressure checks.  We will also follow the patient's potassium     as he is on home potassium supplement.  However, potassium is     within normal limits at this time, therefore, no repletion is     needed. 4. Chronic pain.  We will continue the patient's home Norco 10/325     q.i.d. p.r.n. pain and OxyContin 10 mg p.o. t.i.d.  If additional     pain control is needed after Orthopedic Surgery has seen the     patient, we can increase the patient's pain regimen. 5. Depression.  We will continue the patient on his home Paxil. 6. Fluids, electrolytes, nutrition, gastrointestinal.  We will make     the patient n.p.o. and run IV fluids, normal saline at 100 mL per     hour.  Once the patient is no longer n.p.o., he will be placed on a      carbohydrate-modified diet, and his IV will be saline locked. 7. Prophylaxis.  We will start subcu heparin and continue the     patient's home Prevacid. 8. Disposition.  Admit to inpatient family practice teaching service     for IV antibiotics and observation.  Disposition planing is pending     orthopedic recommendations and improvement of infection.    ______________________________ Lorin Glass, MD   ______________________________ [name]. Andria Frames, M.D.    JM/MEDQ  D:  09/14/2010  T:  09/15/2010  Job:  [id number]  cc:   Briscoe Deutscher, MD  Electronically Signed by Lorin Glass MD on 09/16/2010 10:31:25 PM Electronically Signed by Madison Hickman M.D. on 09/17/2010 11:14:56 AM

## 2010-09-18 LAB — BASIC METABOLIC PANEL
BUN: 8 mg/dL (ref 6–23)
CO2: 30 mEq/L (ref 19–32)
Calcium: 8.8 mg/dL (ref 8.4–10.5)
Chloride: 108 mEq/L (ref 96–112)
Creatinine, Ser: 1.06 mg/dL (ref 0.4–1.5)
GFR calc Af Amer: >60 mL/min (ref >60)
GFR calc non Af Amer: >60 mL/min (ref >60)
Glucose, Bld: 180 mg/dL — ABNORMAL HIGH (ref 70–99)
Potassium: 3.4 mEq/L — ABNORMAL LOW (ref 3.5–5.1)
Sodium: 141 mEq/L (ref 135–145)

## 2010-09-18 LAB — GLUCOSE, CAPILLARY
Glucose-Capillary: 182 mg/dL — ABNORMAL HIGH (ref 70–99)
Glucose-Capillary: 170 mg/dL — ABNORMAL HIGH (ref 70–99)

## 2010-09-18 LAB — TISSUE CULTURE: Culture: NO GROWTH

## 2010-09-18 LAB — CBC
HCT: 41 % (ref 39.0–52.0)
Hemoglobin: 14.1 g/dL (ref 13.0–17.0)
MCH: 28.4 pg (ref 26.0–34.0)
MCHC: 34.4 g/dL (ref 30.0–36.0)
MCV: 82.7 fL (ref 78.0–100.0)
Platelets: 141 10*3/uL — ABNORMAL LOW (ref 150–400)
RBC: 4.96 MIL/uL (ref 4.22–5.81)
RDW: 13.2 % (ref 11.5–15.5)
WBC: 10.3 10*3/uL (ref 4.0–10.5)

## 2010-09-20 LAB — ANAEROBIC CULTURE

## 2010-09-21 LAB — CULTURE, BLOOD (ROUTINE X 2)
Culture  Setup Time: 201204210009
Culture  Setup Time: 201204210009
Culture: NO GROWTH
Culture: NO GROWTH

## 2010-09-24 NOTE — Consult Note (Signed)
NAME:  Anthony [name], Anthony [name]               ACCOUNT NO.:  [id number]  MEDICAL RECORD NO.:  [id number]           PATIENT TYPE:  I  LOCATION:  5007                         FACILITY:  [facility]  PHYSICIAN:  Wylene Simmer, MD        DATE OF BIRTH:  12-20-1950  DATE OF CONSULTATION:  09/14/2010 DATE OF DISCHARGE:                                CONSULTATION   REASON FOR CONSULTATION:  Left second toe osteomyelitis.  HISTORY OF PRESENT ILLNESS:  The patient is a 60 year old male with a greater than 20-year history of type 2 diabetes.  He presented to the [facility] earlier this week with a wound on the second toe. He says this has been there for at least 2 weeks.  He denies any fever, chills, nausea, vomiting, or changes in his appetite.  He has noticed that his blood sugars have been very high for the last few days.  He denies any previous history of ulcer or amputation to his feet.  He is not a smoker and has no thyroid problems.  He complains of some mild pain in the second toe that is worse when it rubs against tissue.  It feels better when he rests and keeps his feet elevated.  He denies any history of neuropathy in his feet.  PAST MEDICAL HISTORY: 1. Type 2 diabetes. 2. Hypertension. 3. Coronary artery disease.  PAST SURGICAL HISTORY:  Coronary artery stenting.  SOCIAL HISTORY:  The patient works at [employer] on [address].  He does not use any tobacco products.  FAMILY HISTORY:  Positive for hypertension.  PHYSICAL EXAMINATION:  The patient is a well-nourished, well-developed male in no apparent distress.  He is alert and oriented x4.  Mood and affect are normal.  Extraocular motions are intact.  Respirations are unlabored.  He has quite poor dentition.  His left foot has an ulcer on the dorsum of the second toe over the PIP joint.  This is approximately 1.5 cm in diameter.  There is significant skin breakdown here.  No significant purulence.  There is a scant serosanguineous  drainage. There is mild erythema about the toe that extends into the dorsum of forefoot.  He has palpable pulses.  Sensibility to light touch is intact throughout the forefoot.  There is no lymphadenopathy noted.  Strength is 5/5 in plantarflexion and dorsiflexion at the ankle and toes.  X-RAYS:  AP, lateral, and oblique views of the foot show some mild degenerative changes at the hallux MP joint, but no gross bony destruction.  His MRI findings show enhancement in the proximal and middle phalanges of the second toe on the left foot as well as superficial cellulitis. This is consistent with osteomyelitis of the second toe.  ASSESSMENT: 1. Left foot second toe osteomyelitis. 2. Left foot second toe diabetic ulcer. 3. History of coronary artery disease. 4. History of type 2 diabetes.  PLAN:  Based on the patient's physical exam and MRI findings, I have explained these diagnoses to the patient in detail.  I believe eradication of this osteomyelitis requires amputation of his second toe. I have explained the risks  and benefits of this treatment option as well as the alternative treatment options.  He elects surgical treatment with amputation of his second toe.  We will get him scheduled for surgery tomorrow.  He will be n.p.o. after midnight.  He understands the risks and benefits of the operative treatment and would like to proceed.     Wylene Simmer, MD     JH/MEDQ  D:  09/14/2010  T:  09/15/2010  Job:  [id number]  Electronically Signed by Wylene Simmer  on 09/24/2010 08:03:11 AM

## 2010-09-24 NOTE — Op Note (Signed)
NAME:  Anthony [name], Anthony [name]               ACCOUNT NO.:  [id number]  MEDICAL RECORD NO.:  [id number]           PATIENT TYPE:  I  LOCATION:  5007                         FACILITY:  [facility]  PHYSICIAN:  Wylene Simmer, MD        DATE OF BIRTH:  1950-08-14  DATE OF PROCEDURE:  09/15/2010 DATE OF DISCHARGE:                              OPERATIVE REPORT   PREOPERATIVE DIAGNOSIS:  Left second toe diabetic ulcer with osteomyelitis of the middle and proximal phalanges.  POSTOPERATIVE DIAGNOSIS:  Left second toe diabetic ulcer with osteomyelitis of the middle and proximal phalanges.  PROCEDURE:  Left second toe amputation through the MTP joint.  SURGEON:  Wylene Simmer, MD  ANESTHESIA:  General.  IV FLUIDS:  See Anesthesia record.  ESTIMATED BLOOD LOSS:  Minimal.  TOURNIQUET TIME:  6 minutes with an ankle Esmarch.  SPECIMEN:  Deep tissue from the proximal phalanx to Microbiology for culture.  COMPLICATIONS:  None apparent.  DISPOSITION:  Extubated awake and stable to recovery.  INDICATIONS FOR PROCEDURE:  The patient is a 60 year old male with a greater than 25-year history of type 2 diabetes.  He developed an ulcer on the top of his left second toe approximately 2 weeks ago.  This has not healed.  He had cellulitis identified in the [facility] earlier this week.  He was treated with antibiotics.  An MRI was obtained showing osteomyelitis of the middle and proximal phalanges.  He presents now for surgical treatment of this condition.  He understands the risks and benefits of this procedure as well as the alternative treatment options.  Specifically, he understands the risks of bleeding, infection, nerve damage, blood clots, need for additional surgery, revision amputation, and death.  He elects to proceed.  PROCEDURE IN DETAIL:  After preoperative consent was obtained and the correct operative site was identified, the patient was brought to the operating room and placed supine  on the operating table.  General anesthesia was induced.  Preoperative antibiotics had previously been administered.  A surgical time-out was taken.  The left lower extremity was prepped and draped in standard sterile fashion.  A racket incision was marked on the skin about the base of the second toe.  The extremity was exsanguinated and an ankle Esmarch tourniquet was wrapped around the ankle.  The previously marked incision was made and sharp dissection was carried down to the level of the bone.  Subperiosteal dissection was carried down to the MTP joint.  The toe was disarticulated through the MTP joint, passed off the field as a specimen to Microbiology for culture.  The deep tissue was curetted out of the proximal phalanx distally on the back table for the specimen.  The soft tissue was irrigated and debrided copiously.  All nonviable tissue was excised.  Tourniquet was released.  Hemostasis was achieved. The deep tissues were approximated with inverted simple sutures of 4-0 Monocryl, 2-0 nylon.  Horizontal mattress sutures were used to close the incision.  Sterile dressings were applied followed by a compression wrap.  The patient was then awakened by Anesthesia and transported to the recovery room in  stable condition.  Prior to closure, 10 mL of 0.25% Marcaine were infiltrated into the areas of digital nerves on either side of the second metatarsal.  FOLLOWUP PLAN:  The patient will be weightbearing as tolerated on a hard sole shoe.  He will keep his foot elevated.  He will follow up with me in approximately 2 weeks for suture removal.     Wylene Simmer, MD     JH/MEDQ  D:  09/15/2010  T:  09/15/2010  Job:  [id number]  Electronically Signed by Jenny Reichmann Mylah Baynes  on 09/24/2010 08:03:14 AM

## 2010-09-26 NOTE — Discharge Summary (Signed)
NAME:  Anthony [name], Anthony [name]               ACCOUNT NO.:  [id number]  MEDICAL RECORD NO.:  [id number]           PATIENT TYPE:  I  LOCATION:  5007                         FACILITY:  [facility]  PHYSICIAN:  Blane Ohara Brewer Hitchman, M.D.DATE OF BIRTH:  10-17-1950  DATE OF ADMISSION:  09/14/2010 DATE OF DISCHARGE:  09/18/2010                              DISCHARGE SUMMARY   PRIMARY CARE PROVIDER:  Briscoe Deutscher, MD, at [facility].  DISCHARGE DIAGNOSES: 1. Left second toe osteomyelitis. 2. Type 2 diabetes. 3. Peripheral vascular disease. 4. Hypertension. 5. Coronary artery disease. 6. Depression. 7. Chronic pain.  DISCHARGE MEDICATIONS:  New medications on discharge include; 1. Ciprofloxacin 500 mg p.o. b.i.d. x10 days. 2. Oxycodone 10 mg p.o. q.8 hours. 3. Septra/Bactrim 160/800 two tabs p.o. b.i.d.  Medications which remain the same include; 1. Potassium chloride 10 mEq p.o. daily. 2. Novolin 70/30 60 units in the morning, 20 units in the evening.     Note; this is a change from 60 units in the morning and 15 units in     the evening. 3. Amlodipine 10 mg p.o. daily. 4. Aspirin 81 mg p.o. daily. 5. Cozaar 50 mg p.o. daily. 6. Fish oil 1 tab p.o. daily. 7. Furosemide 40 mg p.o. daily. 8. Imdur 60 mg p.o. daily. 9. Metformin 1000 mg p.o. b.i.d. 10.Metoprolol tartrate 100 mg p.o. b.i.d. 11.Norco 10/325 one tab p.o. q.i.d. p.r.n. pain. 12.Paxil 30 mg p.o. daily. 13.Prevacid 30 mg p.o. daily. 14.Simvastatin 20 mg p.o. nightly.  CONSULTS:  Dr. [name] with Orthopedic Surgery.  PROCEDURES: 1. Left second toe amputation for osteomyelitis. 2. MR of left foot on September 14, 2010 prior to amputation showing     cellulitis of the second toe with edema and enhancement throughout     the middle phalanx, the distal one-half of the proximal phalanx     compatible with osteomyelitis; no abscess was noted at that time.  LABORATORY DATA:  On admission, the patient's CBC showed a white  count of 8.7, hemoglobin 13.6, platelets 145 with 70% neutrophils, 19% lymphocytes.  BMET was unremarkable with creatinine of 1.18, hemoglobin A1c was elevated at 11.3.  Blood cultures x2 were no growth to date on the day of discharge.  Tissue culture with no growth and no organisms seen.  Anaerobic tissue culture showed no organisms seen and no anaerobes isolated.  On the day of discharge, the patient's CBC was stable and BMET was unremarkable with the exception of a potassium of 3.4.  Creatinine was stable at 1.06.  BRIEF HOSPITAL COURSE:  This is a 60 year old male with uncontrolled type 2 diabetes and peripheral vascular disease presenting with second toe osteomyelitis, now day 3 status post left second toe amputation.. 1. Osteomyelitis.  The patient was admitted to the hospital and     started on IV vancomycin and Zosyn.  Orthopedic Surgery was     consulted and opted to take the patient to the OR for amputation of     this infected toe.  Ortho input was greatly appreciated.  Per ortho     recommendations, he were to bear weight as  tolerated in a hard-     soled shoe only.  In addition, the patient was informed to elevate     his leg.  The patient's tissue was cultured and was negative at the     time of discharge.  In addition, blood cultures were also negative     at the time of discharge.  The patient was quickly transitioned     from IV antibiotics to p.o. antibiotics.  The patient remained     afebrile  throughout his hospitalization with normal tone and     mildly elevated white blood cell count 2. Type 2 diabetes.  The patient's CBGs were initially very elevated     in the 200s to 300s.  However, on the day prior to discharge,  CBGs     were much better controlled ranging in the 100s.  The patient's     home dose of 70/30 was increased from 60 units every morning, 15     units every evening to 60 units the morning and 20 units in the     evening.  So far, his CBGs appear to  be improved.  The patient's     hemoglobin A1c was greatly elevated at 11.3 on admission; this     should be rechecked in approximately 3 months to assess if this     slightly increased dose of 70/30 is being effective. 3. Hypertension.  The patient initially came in with blood pressures     mostly in the 160s to 180s.  His home Norvasc, Imdur, Cozaar, and     Lopressor were all continued.  The patient's losartan was increased     from 50-100 which seemed to improve his blood pressures.  On the     day of discharge, the patient's blood pressures were averaging more     in the 140s to 150s.  This should also be monitored as an     outpatient basis as many of his medications could also likely be     slightly increased. 4. Depression.  The patient remains stable and was continued on his     home Paxil. 5. Chronic pain.  The patient was continued on his home regimen of     oxycodone 10 mg p.o. q.8 and his Vicodin p.r.n.  The patient was     sent home on this regimen.  DISCHARGE INSTRUCTIONS:  The patient was instructed to elevate his left toe above his nose as much as possible for the first 3 days aftersurgery.  The patient was also told to bear weight on his left foot in a hard-soled shoe only and to leave his dressing in place until he returns to see Dr. [name] in 2 weeks.  FOLLOWUP APPOINTMENTS: 1. The patient is to follow up with Dr. [name] in approximately 2     weeks.  The patient is to call for an     appointment. 2. Dr. Juleen China.  The patient is to follow up with Dr. Juleen China on     Wednesday, Oct 03, 2010 at 9 a.m.  DISCHARGE CONDITION:  The patient was discharged home in stable medical condition.    ______________________________ Lorin Glass, MD   ______________________________ Blane Ohara Kendrell Lottman, M.D.    JM/MEDQ  D:  09/19/2010  T:  09/19/2010  Job:  [id number]  cc:   Briscoe Deutscher, MD Wylene Simmer, MD  Electronically Signed by Lorin Glass MD on 09/24/2010  09:32:42 PM Electronically Signed by Braxdon Gappa M.D.  on 09/26/2010 09:10:53 AM

## 2010-10-03 ENCOUNTER — Ambulatory Visit (INDEPENDENT_AMBULATORY_CARE_PROVIDER_SITE_OTHER): Payer: PRIVATE HEALTH INSURANCE | Admitting: Family Medicine

## 2010-10-03 ENCOUNTER — Encounter: Payer: Self-pay | Admitting: Family Medicine

## 2010-10-03 ENCOUNTER — Telehealth: Payer: Self-pay | Admitting: Family Medicine

## 2010-10-03 VITALS — BP 157/95 | HR 79 | Temp 98.2°F | Ht 72.0 in | Wt 200.0 lb

## 2010-10-03 DIAGNOSIS — E1165 Type 2 diabetes mellitus with hyperglycemia: Secondary | ICD-10-CM

## 2010-10-03 DIAGNOSIS — IMO0002 Reserved for concepts with insufficient information to code with codable children: Secondary | ICD-10-CM

## 2010-10-03 DIAGNOSIS — Z8739 Personal history of other diseases of the musculoskeletal system and connective tissue: Secondary | ICD-10-CM | POA: Insufficient documentation

## 2010-10-03 DIAGNOSIS — I10 Essential (primary) hypertension: Secondary | ICD-10-CM

## 2010-10-03 DIAGNOSIS — E119 Type 2 diabetes mellitus without complications: Secondary | ICD-10-CM

## 2010-10-03 MED ORDER — LOSARTAN POTASSIUM 50 MG PO TABS: 100.0000 mg | ORAL_TABLET | Freq: Every day | ORAL | Status: DC

## 2010-10-03 MED ORDER — INSULIN NPH ISOPHANE & REGULAR (70-30) 100 UNIT/ML ~~LOC~~ SUSP: SUBCUTANEOUS | Status: DC

## 2010-10-03 NOTE — Assessment & Plan Note (Signed)
Encouraged patient to increase Losartan as done in hospital.

## 2010-10-03 NOTE — Patient Instructions (Signed)
It was nice to see you today!

## 2010-10-03 NOTE — Assessment & Plan Note (Signed)
S/P amputation. Follow up with surgeon in 1 hour. No red flags. Finishing Abx.

## 2010-10-03 NOTE — Telephone Encounter (Signed)
Mr. Usher forgot to inform you that there will be some forms faxed to the office for you to complete and send back. Would like for you to call him regarding that matter.

## 2010-10-03 NOTE — Progress Notes (Signed)
  Subjective:    Patient ID: Anthony [name], male    DOB: 1950-06-02, 60 y.o.   MRN: [id number]  HPI  1. Osteomyelitis: See excellent DC Summary by Dr. Loraine Maple for more details. Dx osteomyelitis of 2nd toe, left foot. Now s/p amputation. Finishing Abx. Wearing hard-soled shoe today. Weight-bearing. Has appointment in 1 hour to see Dr. [name] for first time since DC. No fever/chills, pain, N/V/D. "Feeling good today."  2. DMII: Rx Metformin, Novolin 70/30: 50 in am, 20 in pm (increased at last hospital visit), ASA. A1c = 11.3 (previously controlled). He has not been checking his BG at home. Exercise = none. Diet: avoiding sugar and bread. Diabetes complications include diabetic macular edema, nephropathy (proteinuria), neuropathy, and retinopathy. He denies CP, DOE, N/V/D, HA, dizziness, increased numbness/tingling in his hands or feet.   3. HTN: Rx Metoprolol, Lasix, Imdur, Cozaar (increased at last hospital visit, though patient has not been taking increased dose). Cardiologist is Dr. Verl Blalock with [facility]. Last myoview in 2009: EF 68%, no ischemia. Note: Amlodipine caused peripheral edema.  Review of Systems SEE HPI.    Objective:   Physical Exam  Vitals reviewed. Constitutional: He appears well-developed and well-nourished. No distress.  Cardiovascular: Normal rate, regular rhythm, normal heart sounds and intact distal pulses.   Pulmonary/Chest: Effort normal and breath sounds normal.  Psychiatric: He has a normal mood and affect.      Assessment & Plan:

## 2010-10-03 NOTE — Assessment & Plan Note (Signed)
Encouraged patient to check BG. Recheck A1c in 2.5 months.

## 2010-10-03 NOTE — Telephone Encounter (Signed)
Specific example.

## 2010-10-05 NOTE — Telephone Encounter (Signed)
Sending to staff to take care of this non-medical call.

## 2010-10-05 NOTE — Telephone Encounter (Signed)
Called patient to find out what forms needed to be filled out, he says they are SS forms and should be faxed to [place] today. Told patient we would send them back in after they were filled out.Busick, Kevin Fenton

## 2010-10-09 NOTE — Assessment & Plan Note (Signed)
[place] OFFICE NOTE   Anthony [name], Anthony [name] FORDHAM MENEELY                      MRN:          [id number]  DATE:10/02/2006                            DOB:          03-13-1951    Anthony [name] returns today for further management of the following issues:  1. Nonobstructive coronary disease.  He is presently having no angina,      which is kind of unusual.  2. Hypertension.  3. History of hypokalemia.  4. Hyperlipidemia.   He is being followed very closely by Dr. Smith Mince over at [facility].  He says that she is getting married and leaving!  He is really going to  miss her.   MEDS:  1. Aspirin 325 a day.  2. Amlodipine 10 mg a day.  3. Fish Oil 1000 mg daily.  4. Insulin 70/30 as directed.  5. Hyzaar 100/25 daily.  6. Multivitamin daily.  7. Vitamin E and C daily.  8. B Complex daily.  9. Prevacid 30 mg a day.  10.Paroxetine 30 mg a day.  11.Metoprolol 100 mg p.o. b.i.d.  12.Lipitor 10 mg a day.  13.Glucophage 1000 b.i.d.  14.Isosorbide 60 mg a day.   His blood pressure today is 138/76, his pulse is 59 and sinus brady.  EKG shows prominent U-waves.  Weight is 226, up 4.  HEENT:  Normocephalic, atraumatic, PERRLA.  Extraocular movements  intact.  Sclera clear, facial symmetry is normal.  Carotids are full  without bruits, there is no JVD.  Thyroid is not enlarged.  LUNGS:  Clear.  HEART:  Reveals a poorly appreciated PMI.  He has a soft S1, S2.  ABDOMINAL EXAM:  Has particularly good bowel sounds, organomegaly cannot  be assessed.  EXTREMITIES:  Pitting edema 1+ above the sock line, pulses are intact.  NEURO EXAM:  Intact.   ASSESSMENT AND PLAN:  Anthony [name] is having no angina.  He is on an  excellent medical program.  His blood pressure is under relatively good  control.  I am concerned about  hypokalemia with the U-waves on his EKG.  Will check a BNP today.  I  will see him back in a year.     Thomas C.  Verl Blalock, MD, [facility]  Electronically Signed    TCW/MedQ  DD: 10/02/2006  DT: 10/02/2006  Job #: [id number]   cc:   Briscoe Deutscher Smith Mince, M.D.

## 2010-10-09 NOTE — Assessment & Plan Note (Signed)
[place] OFFICE NOTE   NAME:Anthony [name], SIDY CURTNER                      MRN:          [id number]  DATE:01/13/2007                            DOB:          1950/11/01    HISTORY OF PRESENT ILLNESS:  Mr. Janow returns today for further  management of his coronary artery disease.  Dr. Erling Cruz asked him to see me  because of some lower extremity edema.  We just saw him Oct 02, 2006.   He increased his Hyzaar to twice a day, 100/25 b.i.d., on his own  because of the edema.  Last time we saw him, I noticed U waves on his  EKG.  Potassium was borderline low at 3.5.  I suspect his potassium is  even lower now.   His blood pressure has been under good control. He is having no angina.   His last visit he had 1+ pitting edema above his sock line.   MEDICATIONS:  His medications are unchanged since last visit.   PHYSICAL EXAMINATION:  VITAL SIGNS:  His blood pressure today is 122/70,  pulse 60 and regular, weight 234 pounds.  HEENT:  Normocephalic, atraumatic.  PERRLA.  Extraocular movements  intact.  Sclerae are clear.  Facial symmetry is normal.  NECK:  Carotid upstrokes are equal bilaterally without bruits.  No JVD.  Thyroid is not enlarged.  Trachea is midline.  LUNGS:  Clear.  HEART:  Nondisplaced PMI.  Normal S1, S2.  ABDOMEN:  Protuberant with good bowel sounds.  No midline bruit.  EXTREMITIES:  Pitting edema 2+.  Pulses are intact.   He specifically denies any orthopnea, PND or symptoms of heart failure.   STUDIES:  Last stress Myoview demonstrated an EF of 59%.   ASSESSMENT:  1. Edema.  This is multifactorial.  Contributing factors include      weight, decreased mobility, Amlodipine at 10 mg daily, hot weather,      question dietary indiscretion which he says he follows pretty      closely.  2. Diabetes.   PLAN:  His blood pressure has been under such good control, I have  decreased his Amlodipine to 5 mg  daily.  I have reinforced compliance  with a low sodium diet.  We also talked about  potassium supplementation with diet.  I told him to cut his Hyzaar back  to 100/25 daily.  Since he is doing better, I will see him back again.  I will see him back in three months.     Thomas C. Verl Blalock, MD, [facility]  Electronically Signed    TCW/MedQ  DD: 01/13/2007  DT: 01/14/2007  Job #: [id number]

## 2010-10-09 NOTE — Assessment & Plan Note (Signed)
[facility]                            CARDIOLOGY OFFICE NOTE   NAME:Anthony [name], Anthony [name]                      MRN:          [id number]  DATE:01/01/2008                            DOB:          1951-01-18    Anthony [name] comes in today for further management of the following  issues:  1. Coronary artery disease.  He is having no angina.  He has some mild      dyspnea on exertion.  Last stress Myoview was on September 19, 2005, EF      of 59% with no significant ischemia.  2. Difficult to control hypertension.  In fact, his blood pressure      lately has been very good.  He thinks it is probably running to low      for him.  Clonidine was just added 0.1 b.i.d. by Dr. Erling Cruz.  3. PVCs.   He denies orthopnea, PND or peripheral edema.  His edema has really  gotten much better on spironolactone 25 mg a day.   MEDICATIONS:  1. Aspirin 325 a day.  2. Fish oil 1000 mg a day.  3. Januvia 100 mg a day.  4. Spironolactone 25 mg a day.  5. Amlodipine 5 mg a day.  6. Clonidine 0.1 b.i.d.  7. Insulin.  8. Hyzaar 100/25 daily.  9. Multivitamins.  10.Prevacid 30 mg a day.  11.Paroxetine 30 mg a day.  12.Metoprolol 100 mg p.o. b.i.d.  13.Lipitor 10 mg a day.  14.Glucophage 1000 mg p.o. b.i.d.  15.Isosorbide 60 mg a day.   PHYSICAL EXAMINATION:  VITAL SIGNS:  His blood pressure is 121/71, which  is the best I have ever seen and his pulse is 59.  He is in sinus brady  with occasional PVCs.  EKG is stable with some ST-segment changes  laterally.  His weight is 218, down 7.  HEENT:  Normocephalic and atraumatic.  PERRLA.  Extraocular movements  are intact.  Sclerae are muddy.  Facial symmetry is normal.  NECK:  Carotid upstrokes are equal bilaterally without bruits.  No JVD.  Thyroid is not enlarged.  Trachea is midline.  LUNGS:  Clear.  HEART:  Nondisplaced PMI.  Normal S1 and S2.  Occasional extrasystole.  ABDOMEN:  Protuberant.  Good bowel sounds.  No midline  bruit.  EXTREMITIES:  No cyanosis, clubbing, or edema.  Pulses are intact.  NEURO:  Intact.   I am concerned that Mr. Wagenaar's blood pressure is a little bit too low  for him relatively speaking.  I have asked him to tapers his clonidine  off of the next week and discontinue it.  It could be making him more  bradycardic.   We will arrange for an adenosine rest stress Myoview.  I will see him  back in a year if he is negative for ischemia.     Thomas C. Verl Blalock, MD, [facility]  Electronically Signed    TCW/MedQ  DD: 01/01/2008  DT: 01/02/2008  Job #: [id number]

## 2010-10-09 NOTE — Assessment & Plan Note (Signed)
[place] OFFICE NOTE   Flavil, Mcmorran MATTHEU POINTER                      MRN:          [id number]  DATE:04/02/2007                            DOB:          1951-05-11    Mr. San returns today for further management of his hypertension and  lower extremity edema.   Since I last saw him, we had cut his amlodipine to 5 mg a day with his  blood pressure being under good control.  In addition, Dr. Erling Cruz added  spironolactone 25 mg a day which has helped.  This will also help  potassium sparing which has been an issue with him as well.  His other  medications are unchanged on the maintenance medication list.   PHYSICAL EXAMINATION:  VITAL SIGNS:  His pressure today is 140/68.  It  is usually much better than this.  He says he has a cold.  His heart  rate is 60 and normal sinus rhythm.  His ST segment changes  inferolaterally have improved.  His weight is 225, down 5.  HEENT:  Unchanged.  NECK:  Shows no JVD.  Carotids are full.  Thyroid is not enlarged.  Trachea is midline.  LUNGS:  Clear.  HEART:  Reveals a poorly appreciated PMI.  He has no S4.  S2 is split  physiologically.  ABDOMEN:  Protuberant.  Good bowel sounds.  EXTREMITIES:  Reveal no edema!  Pulses are intact.   I am really pleased by how Mr. Lawhead is doing.  He assures me his blood  pressure is under good control.  I have made no change in his program.  I will see him back again in 6 months.     Thomas C. Verl Blalock, MD, [facility]  Electronically Signed    TCW/MedQ  DD: 04/02/2007  DT: 04/02/2007  Job #: [id number]   cc:   Lennon Alstrom, MD

## 2010-10-10 ENCOUNTER — Telehealth: Payer: Self-pay | Admitting: Family Medicine

## 2010-10-10 NOTE — Telephone Encounter (Signed)
Patient dropped off FMLA forms to be filled out.  Please call him when completed.

## 2010-10-12 NOTE — H&P (Signed)
NAME:  Anthony [name], Anthony [name]                         ACCOUNT NO.:  [id number]   MEDICAL RECORD NO.:  [id number]                   PATIENT TYPE:  INP   LOCATION:  2020                                 FACILITY:  [place]   PHYSICIAN:  Rachell Cipro, M.D.               DATE OF BIRTH:  September 17, 1950   DATE OF ADMISSION:  03/17/2002  DATE OF DISCHARGE:                                HISTORY & PHYSICAL   CHIEF COMPLAINT:  Chest pressure.   HISTORY OF PRESENT ILLNESS:  This 60 year old African-American male with  past medical history significant for coronary artery disease, diabetes  mellitus, and hyperkalemia, presents to the [facility] this  morning complaining of weakness, diaphoresis, and chest pressure.  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  He states the episode started about 10 a.m.  He had  some palpitations and shortness of breath with this chest pressure.  He  denies any nausea or vomiting, and also denied any radiation pain.  The  patient states that his pain was approximately a 6/10, and is now down to a  3 or 4/10.   The patient also states that he has had a fluttering in his chest  intermittently for the last five or six days.  He states that he has some  shortness of breath with these palpitations, and also some diaphoresis and  color change.   REVIEW OF SYMPTOMS:  CONSTITUTIONAL:  None.  CARDIOVASCULAR:  Significant  for palpitations and chest pressure.  RESPIRATORY:  Positive shortness of  breath with palpitations.  GASTROINTESTINAL:  No abdominal pain, nausea or  vomiting.  SKIN:  Complaint of some diaphoresis and pale color per his co-  workers this morning.  NEUROLOGIC:  Complains of generalized weakness and  also blurry vision at onset of his chest pain this morning.  PSYCHIATRIC:  No exacerbations of his depression currently.  MUSCULOSKELETAL:  He is  status post lumbar injection for his back  sciatica.  GENITOURINARY:  No  polyuria or polydipsia.  No dysuria.  ENT:  No URI signs or symptoms.   PAST MEDICAL HISTORY:  1. Diabetes mellitus type 2 with diabetic neuropathy.  2. Hypercholesterolemia.  3. Tobacco abuse.  4. Hypertension.  5. Depression.  6. Reflux disease.  7. Allergic rhinitis.  8. Organic impotence.  9. Sciatica.  10.      Diabetic retinopathy.  11.      Dry eyes.  12.      Microalbuminuria.   MEDICATIONS:  1. Allegra p.r.n.  2. Aspirin 81 mg p.o. q.d.  3. Celexa 20 mg p.o. q.d.  4. Glucophage XR two tabs to equal 1000 mg p.o. b.i.d.  5. Hyzaar 100/25 mg p.o. q.d.  6. Insulin 70/30 15 units q.a.m. and 10 units q.p.m.  7. Multivitamin.  8. Verapamil SA 100 mg p.o. b.i.d.  9.  Vioxx p.r.n.  10.      Viagra p.r.n., not used in weeks.  11.      Vitamin B.  12.      Vitamin E.  13.      Zantac.   PAST PROCEDURAL HISTORY:  1. Stress Cardiolite in 2000, with small focal lateral wall ischemia.  2. Carotid Dopplers in 2000, were within normal limits.  3. Echocardiogram in 2000, had an ejection fraction of 55 to 60% with left     ventricular hypertrophy.  4. Upper GI in 2000, which was within normal limits.   SOCIAL HISTORY:  This gentleman smokes one pack per day.  He is employed as  a Sports coach and also part-time at [employer].  He has two part-time jobs which  do not have any benefits.  He makes a little too much to qualify for  Medicaid, and is not the age for Medicare.  He does get funding for his  medications through the [facility] in [place].   LABORATORY DATA:  Recent labs include a hemoglobin A1C of 7.6 in 7/03.  Immunizations are up-to-date.  Baseline creatinine is 0.9.   FAMILY HISTORY:  Negative for any coronary artery disease.   PHYSICAL EXAMINATION:  VITAL SIGNS:  Blood pressure 180/88, pulse 80,  respirations 20, saturation 98% on room air.  GENERAL:  In no acute distress.  Alert and oriented x3.  LUNGS:  Clear to  auscultation bilaterally.  HEART:  Regular rate and rhythm.  No murmurs, rubs, or gallops.  ABDOMEN:  Positive bowel sounds, nontender, nondistended, soft, mild ventral  hernia appreciated.  HEENT:  Normocephalic, atraumatic.  Pupils equal, round, reactive to light  and accommodation.  Conjunctivae clear.  Sclerae anicteric.  Oropharynx non-  erythemic.  Mucosa is slightly tacky and pink.  NECK:  Supple, no lymphadenopathy.  SKIN:  Normal color and turgor.  No icterus, purpura, or rash.  NEUROLOGIC:  Cranial nerves II-XII intact.  Reflexes within normal limits.  RECTAL:  Prostate 1+, heme negative.   LABORATORY DATA:  EKG which showed some new anterior ST elevation, some  chronic lateral inverted T's.   TREATMENT:  Included nitroglycerin in the [facility] which  provided pain relief from a 3 to 4/10 to a 0/10.   ASSESSMENT AND PLAN:  1. Chest pain.  Resolved with nitroglycerin.  This patient has multiple     cardiac risk factors, and we will admit him for at least 23 hour     observation and to rule out myocardial infarction protocol.  We will     start IV heparin and start a beta blocker.  We will also continue the ARB     that he is on (Hyzaar), give him O2 nasal cannula, and nitroglycerin     p.r.n.  He will have cardiac enzymes checked q.8h. and an EKG done when     gets to the hospital, as well as in the morning.  Cardiology consult may     be called pending the above results, and Dr. Olevia Perches is his primary     cardiologist.  2. Palpitations.  The patient with new onset of heart palpitations for the     last five days.  This may very well be due to unstable angina and his     symptoms above.  However, I list this as a separate problem because if he     does rule out for ischemia he should be considered for an event  monitor     at discharge.  3. Hypertension.  We will resume the Hyzaar and add a beta blocker as above.    We will hold his calcium channel blocker for  now, and discontinue any     Vioxx use.  4. Diabetes mellitus.  We will continue with insulin 70/30 as at home, as     well as sliding scale, and hold the Glucophage XR while he is     hospitalized.  The patient does have normal baseline renal function,     however, has multiple complications of diabetic retinopathy and some     neuropathy.  5. Gastroesophageal reflux disease.  We will continue Protonix while in     hospital.                                               Rachell Cipro, M.D.    ED/MEDQ  D:  03/17/2002  T:  03/17/2002  Job:  [id number]   cc:   Eustace Quail, MD [facility]  [address]  [place] [address]

## 2010-10-12 NOTE — Telephone Encounter (Signed)
Form placed in Dr. Alcario Drought box.

## 2010-10-12 NOTE — Discharge Summary (Signed)
[facility]  Patient:    Anthony [name], Anthony [name]                      MRN: [id number] Adm. Date:  [id number] Disc. Date: [id number] Attending:  McDiarmid, Blane Ohara. Dictator:   Early Chars. Waymon Amato CC:         [facility]                           Discharge Summary  PRIMARY CARE PHYSICIAN:  Ledora Bottcher, M.D.  DISCHARGE DIAGNOSES: 1.  Chest pain, rule out myocardial infarction. 2.  Negative Persantine. 3.  Gastroesophageal reflux disease. 4.  Dyspepsia. 5.  Depressive disorder. 6.  Diabetic neuropathy. 7.  Obesity. 8.  Diabetes, type II. 9.  Catheterization, May 28, 1999, with left anterior descending 40% stenosis,     normal ejection fraction of 60%. 10. Smoking history.  DISCHARGE MEDICATIONS: 1.  Allegra D 1 p.o. b.i.d. 2.  Aspirin 81 mg 1 p.o. q.d. 3.  Celexa 20 mg 1 p.o. q.d. 4.  Celebrex 200 mg 1 p.o. q.d. 5.  Hyzaar 50/12.5 mg 1 p.o. q.d. 6.  Metoprolol 50 mg 1 p.o. b.i.d. 7.  Prevacid 30 mg 1 q.h.s. 8.  Vitamin E 1 p.o. q.d. 9.  Nitrostat 0.4 mg sublingual 1 q.40mnutes x three doses in 15 minutes.  CONSULTATIONS: 1.  [facility].  PROCEDURES: 1.  Spiral CT of chest.  No PE, no infiltrate. 2.  Cardiolite.  No ischemia, no acute changes.  DISPOSITION:  The patient will see cardiology on a p.r.n. basis and will see Dr. KTalbert ForestCorrington [facility]  HISTORY OF PRESENT ILLNESS:  This 60year old male with a history of noncritical coronary artery disease by catheterization had onset of symptoms of chest pain ith radiation to the left shoulder and described this as tightness.  He felt short f breath.  Pain was described as 5/10.  No nausea, vomiting, or diaphoresis. Positive fatigue and increased heart rate.  This had been his first episode since his discharge.  The patient had a catheterization on June 03, 1999 with a 40% stenotic mid LAD, other vessels  normal, normal EF.  The patient had an abnormal EKG in the clinic office [facility]  Risk factors are non-insulin dependent diabetes mellitus, positive hypertension, increased lipids, tobacco, family history, obesity.  HOSPITAL COURSE: #1 - CARDIAC:  [facility] [place] and the patient was started on heparin drip.  Initial EKG showed what was suspected to be acute anterior MI with V1 to V3 ST elevations of II, III, AVF, inferior ST depressions and T wave inversion.  The patient was evaluated for possible pulmonary embolism and had a  negative spiral CT.  Cardiolite test demonstrated EF of 56%, no ischemia.  The patient was pain-free the day of discharge and was encouraged to continue taking his aspirin, beta-blocker, and other medications.  #2 - GASTROINTESTINAL:  The patient has a history of GERD.  He does report having symptoms after he eats, especially when he is recumbent.  He was encouraged to continue taking his Prevacid and any antacids if needed.  Apparently he has had two EGDs and he reports both have been normal.  Ulcer was noted on previous EGD. Unsure of H. pylori status.  The patient was encouraged  to use enteric-coated aspirin.  #3 - PSYCHIATRY, ANXIETY AND DEPRESSION:  The patient is quite anxious about what symptoms he has manifested and what the prognosis is.  May benefit from psychotherapy.  DISCHARGE LABORATORY DATA:  CK, CK-MB, and relative index were negative x two;  third was not drawn in the hospital.  No EKG changes noted further.  The patient requested to leave the discharge of discharge and feels he is maximally improved, and will follow up with Dr. Neta Mends soon.  His vital signs are stable, lung sounds are clear, heart regular rate and rhythm without murmurs.  The abdomen is nondistended with no tenderness and no hepatosplenomegaly.  The extremities how no clubbing,  cyanosis, or edema with less than two seconds capillary refill. The patient will follow up with Dr. Neta Mends as noted. DD:  07/10/99 TD:  07/11/99 Job: [id number]

## 2010-10-12 NOTE — Discharge Summary (Signed)
   NAME:  Anthony [name], Anthony [name]                         ACCOUNT NO.:  [id number]   MEDICAL RECORD NO.:  [id number]                   PATIENT TYPE:  INP   LOCATION:  [id number]                                 FACILITY:  [facility]   PHYSICIAN:  Blima Ledger, M.D.           DATE OF BIRTH:  Nov 29, 1950   DATE OF ADMISSION:  03/17/2002  DATE OF DISCHARGE:  03/20/2002                                 DISCHARGE SUMMARY   Addendum to add copy-to list only.                                               Blima Ledger, M.D.    JM/MEDQ  D:  04/15/2002  T:  04/15/2002  Job:  [id number]   cc:   Rachell Cipro, M.D.  [facility]  [place], [place] [address]  Fax: [phone]   Loretha Brasil. Lia Foyer, M.D. [facility]

## 2010-10-12 NOTE — H&P (Signed)
[place]. [facility]  Patient:    Anthony [name]                       MRN: [id number] Adm. Date:  [id number] Attending:  Garth Bigness CC:         [place] Erin Hearing, M.D., [facility]             Stacie Acres, M.D., [facility]             Ledora Bottcher, M.D., [place]                         History and Physical  CHIEF COMPLAINT:  "Shortness of breath and heaviness in my chest since early this morning."  HISTORY OF PRESENT ILLNESS:  Anthony [name] is a 60 year old African-American, married male who has a history of hypertension, insulin-dependent diabetes, hyperlipidemia, tobacco abuse, who presents with the above complaint.  He awoke  this morning and just did not feel well.  When he went to work at [employer] he had  some shortness of breath.  At about 10 oclock a.m. he began to have a balloon blowing up in his chest sensation.  Around 12 noon this developed into some numbness in his left arm.  He drove himself by private vehicle to the emergency room.  He checked in there at 2:17 p.m.  His ECG demonstrated 3 mm of ST segment elevation in the anterior septal leads. He has LVH with strain from a previous ECG but the ST segments are clearly more than early repolarization.  He was given aspirin, sublingual nitroglycerin, IV heparin, and his discomfort continued.  It was felt that it was best to take him to immediate catheterization.  PAST MEDICAL HISTORY:  Significant for a fall while scraping ice off of at the school in February.  He was admitted at that time and had a complete work-up. is telemetry was benign, ECG showed LVH with strain with some ST segment changes inferolaterally.  Cardiac enzymes were negative.  A 2-D echocardiogram showed mild LVH with mild left atrial enlargement with normal left ventricular function. Carotid Doppler showed minimal plaque in the internal carotid  arteries with antegrade flow in both vertebrals and a stress Cardiolite showed some mild anterolateral apical ischemia.  He was treated medically.  His LDL in the past has been 122.  He has been on insulin for his diabetes and as had diabetes for 15 years.  He used to smoke two packs a day but now smokes a half a pack per day.  He has a history of hypertension.  Past medical history in addition to the above, he has a history of alcohol abuse in the past and has been clean from that since 1993.  He apparently had withdrawal  seizures at that time and pancreatitis.  He has also had a history of depression and is currently on Celexa.  He had a history of gastroesophageal reflux disease. He injured his left lower extremity with a fall, as listed above.  He has a history of impotence.  He is married.  He has two biological children and two step children.  He works at facility in Theatre manager at [employer] and also works another job with [employer].  He denies any drug use.  FAMILY HISTORY:  Significant for his mother who has diabetes, hypertension, and  CVA.  His  father has a history of congestive heart failure.  He has a brother with hypertension.  He has a half sister with diabetes.  REVIEW OF SYSTEMS:  Negative otherwise except for the above.  MEDICATIONS: 1. Glucophage 1000 mg in the morning, 500 in the evening. 2. Hyzaar 50/12.5 q.d. 3. Celexa 20 mg a day. 4. Prevacid 30 mg a day. 5. Insulin 70/30 40 units subcu q.a.m. 6. Celebrex 200 mg a day. 7. Lopressor 50 b.i.d.  He no longer takes aspirin.  PHYSICAL EXAMINATION:  GENERAL:  His examination showed him to be in no acute distress in the emergency room.  VITAL SIGNS:  Blood pressure 171/93, pulse 64 and regular.  His respiratory rate was 20.  Temperature was 99.7.  NECK:  Shows no JVD.  Carotid upstrokes are equal bilaterally with very soft right carotid bruit.  Thyroid is normal.  SKIN:  Warm and  dry.  HEART:  Examination reveals an S4.  There is no murmur.  LUNGS:  Clear.  ABDOMEN:  Examination is protuberant with good bowel sounds.  No midline or flank bruits appreciated.  There is no hepatosplenomegaly.  EXTREMITIES:  Reveal no clubbing, cyanosis, or edema.  Pulses were 2+/4+, bilaterally and symmetrical.  NEUROLOGICAL:  Examination was grossly intact.  LABORATORY DATA:  Pending.  ASSESSMENT:  1. Acute anteroseptal myocardial infarction (approximately six hours into symptoms     that brought him to the hospital).  2. Hypertension.  3. Insulin-dependent diabetes.  4. Obesity.  5. Hyperlipidemia.  6. Tobacco abuse.  7. History of depression.  8. History of gastroesophageal reflux.  9. History of syncopal event, unexplained, in February of 2000. 10. He has a history of minimal plaque in his carotid arteries. 11. History of alcohol abuse, clean since 1993 with withdrawal seizures and     recurrent pancreatitis. 12. Allergic rhinitis.  PLAN:  Emergent cardiac catheterization.  Risks, indication and potential benefits of the procedure have been discussed with the patient.  He agrees to proceed. e has been enrolled in the COMA study.  We will work on cardiac risk modification.  With his multiple risk factors, he clearly needs a statin.  He also needs to quit smoking, which we will initiate efforts here.  He also needs to be on a aspirin a day. DD:  06/03/99 TD:  06/04/99 Job: [id number]

## 2010-10-12 NOTE — Discharge Summary (Signed)
NAME:  Anthony [name], Anthony [name]                         ACCOUNT NO.:  [id number]   MEDICAL RECORD NO.:  [id number]                   PATIENT TYPE:  INP   LOCATION:  [id number]                                 FACILITY:  [facility]   PHYSICIAN:  Billey Chang, M.D.                  DATE OF BIRTH:  26-Jun-1950   DATE OF ADMISSION:  03/17/2002  DATE OF DISCHARGE:  03/20/2002                                 DISCHARGE SUMMARY   DISCHARGE DIAGNOSES:  1. Coronary artery disease, status post stent placement, March 19, 2002.  2. Type 2 diabetes,  3. Hypertension.  4. Gastroesophageal reflux disease.  5. Tobacco use.  6. Hypokalemia, resolved.  7. Hyponatremia, stable.  8. History of chest palpitations, resolved.   DISCHARGE MEDICATIONS:  1. Metoprolol 100 mg p.o. b.i.d.  2. Hyzaar 100/25 mg p.o. every day.  3. Aspirin 81 mg p.o. every day.  4. Protonix 40 mg p.o. every day.  5. Plavix 75 mg p.o. every day for three months.  6. Nitroglycerin 0.4 mg sublingual every five minutes x3 doses p.r.n. chest     pain.   DISCHARGE INSTRUCTIONS:  Patient able to return to work, March 29, 2002,  from a cardiac standpoint.   FOLLOWUP:  The patient is to follow up with Dr. Rachell Cipro at the [facility] on March 25, 2002.  He is also to follow up  with cardiology at Dr. Cloretta Ned [facility] office and will be informed of  his appointment time.   CONSULTATIONS:  Dr. Loretha Brasil. Stuc[place], cardiology.   PROCEDURES:  Cardiac catheterization on March 19, 2002 with stent  placement.   BRIEF HISTORY AND PHYSICAL:  The patient is a 61 year old African American  with a past medical history of coronary artery disease, diabetes and  hypercholesterolemia, who was admitted for complaints of weakness,  diaphoresis and chest pressure with exertion.  The patient has a history of  an MI in February 2000.  The patient also complains of some chest  palpitations for the previous five days.  On  admission, he was found to have  a blood pressure of 180/88 and a pulse of 80 and O2 saturation of 98% on  room air.  Physical exam was benign.  The patient was given nitroglycerin  sublingual in the [facility] and reported that his pain was  completely relieved.  The patient was sent to the hospital for rule out MI  protocol.  For addition history and physical on presentation, please see the  dictated admission note.   HOSPITAL COURSE:  Problem 1:  CHEST PAIN IN THE SETTING OF CORONARY ARTERY  DISEASE:  On admission, the patient was placed on metoprolol, NitroTab,  aspirin, heparin drip and given nitroglycerin sublingual as needed.  Three  sets of cardiac enzymes were obtained which were not consistent with an MI  with CKs of 165, 155 and  133, CK-MBs of 4.4, 3.8 and 3.7 and troponin I's of  0.02, 0.01 and 0.02.  In the setting of a prior Cardiolite which showed some  ischemic changes, cardiology consult was obtained.  Per cardiology, a  cardiac catheterization was done which showed 60-70% stenosis of the mid-  circumflex artery and 20% stenosis of the mid right coronary artery.  Ejection fraction was found to be 65% without regional wall motion  abnormality.  Stenting of the circumflex lesion was performed, which the  patient tolerated well.  During his hospitalization, the patient was without  chest pain, palpitations or shortness of breath.  He was discharged home on  October 25th and is to follow up with cardiology one to two weeks after  discharge.  The patient received cardiac rehab while an inpatient and  received instructions on the risk factors, the recommended diet and exercise  following a stent placement.  He also received instruction on what to do if  he experienced chest pain.   Problem 2:  HYPERTENSION:  At admission, the patient's blood pressure was  slightly elevated and he was placed on hydrochlorothiazide, metoprolol and  losartan.  His outpatient verapamil  was held, as well as his outpatient  Vioxx, which could aggravate his elevated blood pressure.  During his  hospital stay, the patient's blood pressure came under better control, but  he will likely need adjustment of his medication as an outpatient to  maintain his systolic blood pressure less than 130.   Problem 3:  DIABETES, TYPE 2:  On admission, the patient was continued on  his outpatient regimen of 70/30 insulin, but his Glucophage XR was held.  Q.a.c. and q.h.s. CBCs were checked and sliding-scale insulin was used for  coverage.  During his stay, he had suboptimal control of his blood sugars.  A hemoglobin A1c was measured in July of 2003 and found to be 7.6.  It is  likely the patient's elevated CBGs in the hospital were secondary to not  receiving his normal outpatient Glucophage.   Problem 4:  HYPERCHOLESTEROLEMIA:  Per his clinic records, the patient had  previously been diagnosed with hypercholesterolemia but not on any  cholesterol medication.  A lipid profile done in the hospital showed a total  cholesterol of 160, triglycerides 122, HDL 42 and LDL of 94.  In the setting  of his coronary artery disease, it would be optimal for the patient to be  placed on a statin, however, the patient receives his drugs through the  [facility] and therefore must be started on one that is on  their formulary.  The patient requested at this time to not be started on a  statin secondary to his financial problems, but it is, however, very  strongly recommended that he be placed on a statin as an outpatient using  the [facility].   Problem 5:  GASTROESOPHAGEAL REFLUX DISEASE:  The patient was placed on  Protonix 40 mg p.o. every day as an inpatient and his GERD remained stable.   Problem 6:  HISTORY OF PALPITATIONS:  During his hospitalization, the patient experienced no recurrence of the palpitations he had had prior to  admission.  No  abnormalities were noted on telemetry.   Problem 7:  TOBACCO USE:  The patient reported that he has been able to  decrease his tobacco use from two packs a day down to one-half pack a day;  prior to admission, however, he had found  it difficult to decrease his  smoking further.  He expresses desire to quit but has limited financial  resources.  He was visited by the [facility] pharmacology team  who reviewed nicotine replacement products and Zyban therapy with him.  The  pharmacology team recommends that the patient follow up with them for a full  workup in the clinic on any Tuesday or Thursday morning.  The patient was  instructed to call and schedule an appointment if he desired one.  Per the  pharmacy team, the patient is an excellent candidate for smoking cessation  and this should be greatly encouraged in a patient with his medical history.   Problem 8:  HYPOKALEMIA:  On admission, the patient's potassium was found to  be 3.4.  His potassium was replaced orally and increased to normal limits.   Problem 9:  HYPONATREMIA:  On admission, the patient's sodium was found to  be 133, which remained stable throughout his hospitalization.  The patient  had no side-effects related to this slightly decreased sodium.  This should  be followed as an outpatient.   Problem 10:  HEALTH MAINTENANCE:  During his hospitalization, the patient  received a flu shot.     Blima Ledger, M.D.                 Billey Chang, M.D.    JM/MEDQ  D:  04/15/2002  T:  04/15/2002  Job:  [id number]

## 2010-10-12 NOTE — Assessment & Plan Note (Signed)
[facility]                              CARDIOLOGY OFFICE NOTE   NAME:Anthony [name], Anthony [name]                      MRN:          [id number]  DATE:12/26/2005                            DOB:          1951/03/25    Anthony [name] returns today for further management of his coronary artery  disease.  He is not having symptoms at present of angina. He has had  occasional episode of angina that requires one sublingual nitroglycerin.   We  saw him in April at which time his pain was not exactly clear cut. He  had an adenosine Myoview which showed EF of 59% with some mild chest pain  but normal wall motion and contractility and perfusion.  It is felt to be a  low risk scan.   Dr. Smith Mince has added Lipitor which I agree with 10 mg a day and isosorbide  mononitrate 30 b.i.d.  I would recommended 60 mg once a day.   PHYSICAL EXAMINATION:  VITAL SIGNS:  His blood pressure today is 168/88,  pulse is 62 and regular, his weight is 222, he is in no acute distress.  LUNGS:  Clear.  HEART:  Regular rate and rhythm.  Carotid upstrokes are equal bilaterally  without bruits.  No JVD.  ABDOMEN:  Soft with good bowel sounds.  EXTREMITIES:  No edema.  Pulses are intact.   Anthony [name] is doing well.  I would recommend changing his isosorbide to 60  mg  p.o. q.a.m.  He needs more intensive blood pressure treatment and I will  leave this to Dr. Smith Mince.  He asked for Viagra today but I refused to give  it to him until his blood pressure is under better control.   We will plan on seeing him back in April 2008.                               Thomas C. Verl Blalock, MD, [facility]    TCW/MedQ  DD:  12/26/2005  DT:  12/26/2005  Job #:  [id number]   cc:   Briscoe Deutscher Smith Mince, MD

## 2010-10-12 NOTE — Cardiovascular Report (Signed)
. [facility]  Patient:    Anthony [name]                       MRN: [id number] Proc. Date: 06/03/99 Adm. Date:  [id number] Attending:  Garth Bigness CC:         Marijo Conception. Wall, M.D. [facility]             Bruce R. Olevia Perches, M.D. [facility]             Cardiac Catheterization Laboratory                        Cardiac Catheterization  INDICATIONS:  Anthony [name] is 60 years old and has multiple risk factors for coronary disease.  He had the onset of chest pressure and shortness of breath at 10 a.m.  this morning and presented to our emergency room with ECG changes showing a 2-3 mm ST elevation in the anterior leads.  He does have LVH and did have some slight T elevation before but this had changed and because of persistent pain it was thought he was having an anterior wall myocardial infarction.  He was ruled in, protocol and brought to the cardiac catheterization laboratory.  Dr. Verl Blalock saw him in the  emergency room.  DESCRIPTION OF PROCEDURE:  The procedure was performed via the right femoral artery using an arterial sheath and 6 French preformed coronary catheters.  A front wall arterial puncture was performed and Hexabrix contrast was used.  A distal aortogram was performed to rule out renal vascular causes for hypertension.  The patient tolerated the procedure well and left the laboratory in satisfactory condition.   RESULTS:  The left main coronary artery:  The left main coronary artery was free of significant disease.  Left anterior descending:  The left anterior descending artery gave rise to two  large diagonal branches and four septal perforators.  There was 40% narrowing in the LAD which was segmental in the mid to distal portion.  The rest of the vessel was free of significant disease.  Circumflex artery:  The circumflex artery gave rise to an intermediate branch, marginal branch, and a posterolateral branch.  These vessels were free  of significant disease.  Right coronary artery:  The right coronary gave rise to a conus branch, a right  ventricular branch, a posterior descending branch, and a posterolateral branch.  These vessels were free of significant disease.  LEFT VENTRICULOGRAPHY:  The left ventriculogram was performed in the RAO projection showed good wall motion with no areas of hypokinesis.  The estimated ejection fraction was 60%.  CONCLUSIONS:  Nonobstructive coronary artery disease with normal left ventricular function.  RECOMMENDATIONS:  In view of these findings, it appears that the patients pain s not likely ischemic and it does not appear that he has had an acute infarction. We will plan admission to telemetry and further evaluation of his chest pain. DD:  06/03/99 TD:  06/04/99 Job: [id number]

## 2010-10-12 NOTE — Consult Note (Signed)
NAME:  Anthony [name], Anthony [name]                         ACCOUNT NO.:  [id number]   MEDICAL RECORD NO.:  [id number]                   PATIENT TYPE:  INP   LOCATION:  2020                                 FACILITY:  [facility]   PHYSICIAN:  Loretha Brasil. Lia Foyer, M.D. [facility]         DATE OF BIRTH:  06-17-1950   DATE OF CONSULTATION:  DATE OF DISCHARGE:                                   CONSULTATION   CARDIAC CONSULTATION:   CHIEF COMPLAINT:  Chest pain.   HISTORY OF PRESENT ILLNESS:  The patient is a pleasant 60 year old gentleman  with a prior history of catheterized in 2000 who presents with episodes of  chest discomfort.  The chest discomfort initially started about a month ago,  and he says during that period of time, he has had some tightness with  exertion.  In the last few days, he has had episodes where he felt the heart  racing away and associated with this, he had some chest pain.  He has  admitted to the hospital and subsequently undergone telemetric monitoring,  and there has been no evidence of arrhythmia.  Cardiac enzymes have been  negative.  Prior evaluation included in 2000 at which time he had an echo  which revealed ejection fraction of 55-60% with mild left ventricular  hypertrophy.  A stress Cardiolite was positive for ischemia.  A  catheterization done in 1/01 revealed 40% of the mid-left anterior  descending artery with an ejection fraction of 60%.  Cardiolite done in  February revealed an ejection fraction of 56% with diaphragmatic  attenuation.  Cardiology consult has been called because he has had  recurrent chest discomfort.   There was mild hypokalemia on admission which has been replaced.  Troponins  have been negative.  CK-MBs have been probably normal with one borderline  elevation.   PAST MEDICAL HISTORY:  1. Prior cardiac evaluation as noted.  2. Insulin-dependent diabetes mellitus with retinopathy.  3. Obesity.  4. Hypertension.  5. History of depression.  6.  Gastroesophageal reflux.  7. History of pancreatitis.  8. Allergic rhinitis.  9. Sleep apnea.   FAMILY HISTORY:  His mother had diabetes, hypertension, and a CVA, as well  as breast CA.  His father had heart failure and dementia.  He has six  siblings.  This includes a history of migraine, phlebitis, hypertension,  gout, and diabetes.   ALLERGIES:  No known drug allergies.   MEDICATIONS:  1. Allegra p.r.n.  2. Aspirin 81 mg daily.  3. Celexa 20 q.d.  4. Glucophage XR 1000 mg p.o. b.i.d.  5. Hyzaar 100/25 q.d.  6. 70/30 insulin, 15 units a.m., 10 units p.m.  7. Verapamil SA 100 mg b.i.d.  8. Vioxx p.r.n.  9. Viagra p.r.n.  10.      Vitamin B, E, and Zantac p.r.n.   REVIEW OF SYSTEMS:  Noncontributory.   PHYSICAL EXAMINATION:  GENERAL:  He is an alert gentleman playing  with his  grandchild when I entered the room.  VITAL SIGNS:  Weight 215.4 pounds, temperature 98, pulse 68, blood pressure  128/62, respiratory rate 16 with an SAO2 of 97% on room air.  NECK:  There is no obvious jugular venous distention.  LUNGS:  Clear to auscultation and percussion.  CARDIAC:  Rhythm was regular with a prominent S4 gallop.  ABDOMEN:  Soft without hepatosplenomegaly.  EXTREMITIES:  No edema.  Femoral pulses are intact.  NEUROLOGIC EXAM:  Unremarkable.   DATA:  Chest x-ray is read as cardiomegaly with no active disease.   An EKG reveals changes as noted on previous tracings.  The patient has old  tracing within his wallet.  The EKG reveals left ventricular hypertrophy  with prominent ST elevation in V1 through V3 which has been noted on  previous tracings.  There is T wave inversion in the inferolateral leads  which has been previously noted, likely related to at least some left  ventricular hypertrophy.   Hemoglobin 13.2, hematocrit 40.3, platelets 161,000.  BUN 12, creatinine  0.9, glucose 88, potassium 3.4.  Liver function studies are unremarkable.  Total protein 6.3, albumin 3.5.  CK-MB as note with MB 4.4.  Total  cholesterol 160, triglycerides 122, HDL 42, and LDL 94.   IMPRESSION:  1. Recurrent chest discomfort with multiple cardiac risk factors and     longstanding abnormal EKG.  2. Hypertension with secondary left ventricular hypertrophy.  3. Continued tobacco use.  4. Insulin-dependent diabetes mellitus.   DISPOSITION:  The patient's symptoms are worrisome.  In particular, he is a  diabetic with a high risk factor profile.  He has continued to smoke.  His  symptoms over the past month have sounded fairly typical.  The episodes in  the last few days have been somewhat more atypical and associated with rapid  palpitations although no obvious arrhythmia has been identified on the  monitor.  At the present, my leaning would be in the direction of cardiac  catheterization.  While this may not have changed, his symptoms are worrisome enough, and he  thinks that they are fairly similar to the ones in 2000.  We will proceed  with cardiac catheterization if it can be scheduled tomorrow.  If not,  arrangements for outpatient evaluation may be necessary.                                                Loretha Brasil. Lia Foyer, M.D. [facility]    TDS/MEDQ  D:  03/18/2002  T:  03/18/2002  Job:  [id number]   cc:   Rachell Cipro, M.D.  [facility]  [place], [place] [address]  Fax: [phone]   Derrell Lolling, M.D.

## 2010-10-12 NOTE — Cardiovascular Report (Signed)
NAME:  Anthony [name], Anthony [name]                         ACCOUNT NO.:  [id number]   MEDICAL RECORD NO.:  [id number]                   PATIENT TYPE:  INP   LOCATION:  6599                                 FACILITY:  [facility]   PHYSICIAN:  Ethelle Lyon, M.D. [facility]         DATE OF BIRTH:  [birth year]   DATE OF PROCEDURE:  03/19/2002  DATE OF DISCHARGE:                              CARDIAC CATHETERIZATION   PROCEDURES:  Coronary angiography, left heart catheterization, left  ventriculography, intravascular ultrasound at the circumflex, stent to the  circumflex.   INDICATIONS:  The patient is a 60 year old gentleman with diabetes mellitus,  dyslipidemia, hypertension, and tobacco use who presents with chest pain.  Electrocardiogram demonstrated lateral T wave inversions. He was therefore  referred for diagnostic angiography.   DIAGNOSTIC TECHNIQUE:  Informed consent was obtained. Under 1% lidocaine  local anesthesia, a 6 French sheath was placed in the right femoral artery  using the modified Seldinger technique. Angiography and ventriculography  were performed using JL4, JR4, and angled pigtail catheters. The procedure  then moved to intravascular ultrasound and percutaneous coronary  intervention.   DIAGNOSTIC FINDINGS:  1. Left main:  Angiographically normal.  2. LAD:  The LAD is a large vessel giving rise to two diagonal branches.  It     is angiographically normal.  3. Circumflex:  The circumflex is a large vessel giving rise to two obtuse     marginal branches.  There is a 60-70% stenosis of the mid circumflex     extending into the proximal portion of the second obtuse marginal branch.  4. RCA:  The RCA is a large vessel.  There is a 20% stenosis of the mid     vessel.  5. LV:  EF equals 65% without regional wall motion abnormality.   PERCUTANEOUS CORONARY INTERVENTION TECHNIQUE:  The lesion of the circumflex  correlated well with his region of electrocardiographic abnormality but  was  intermediate angiographically.  Therefore, I performed intravascular  ultrasound to better define the lesion. Anticoagulation was initiated with  heparin to achieve an ACT of greater than 250 seconds.  The Voda left 4  guide was advanced over a wire and engaged in the ostium of the left main  coronary artery.  A Lugue wire was advanced across the lesion and into the  distal second obtuse marginal branch.  Automated pullback was performed.  This demonstrated diffuse plaquing of the entirety of the vessel.  This was  most severe at the site of the angiographic lesion. At this lesion there was  a minimal luminal area of 3.0 mm sq with luminal diameter of 2.0 x 1.7 mm.  This was compared with a diseased distal reference segment, which has a  luminal area of 6.1 mm sq.  Luminal diameter was 3.0 x 2.7 mm.  Adventitial  diameter was 3.8 mm. The 3.0 mm sq minimal luminal area represented greater  than 50% area of  stenosis. It is substantially less than the 4 mm minimal  lumen area above which patients have shown to do well with conservative  therapy on long-term followup.  Therefore, the decision was made to treat  the lesion percutaneously.  Integrilin was initiated.  ACT was confirmed to  be 252 seconds.  The lesion was directly stented with a 3.0 x 18 mm CYPHER  deployed at 14 atmospheres.  The mid and distal portions of the stent were  post-dilated using a 3.25 x 12 mm Quantum balloon at 14 atmospheres.  The  proximal portion of the stent was post-dilated using this balloon at 16  atmospheres.  Final angiograms demonstrated no dissection, no residual  stenosis and TIMI-3 flow.    IMPRESSION/PLAN:  Successful stenting of the circumflex lesion extending at  a second obtuse marginal branch using a drug-eluting stent. He will need to  be continued on Plavix for a minimum of three months to allow  endoepithelization of the drug-eluting stent.  We will continue Integrilin  for 18 hours.  Sheaths will be removed when the ACT is less than 150 seconds.                                               Ethelle Lyon, M.D. [facility]    WED/MEDQ  D:  03/19/2002  T:  03/22/2002  Job:  [id number]

## 2010-10-12 NOTE — Discharge Summary (Signed)
[place]  Patient:    Anthony [name], Anthony [name]                     MRN: [id number] Disc. Date: 06/05/99 Dictator:   Sharyl Nimrod, P.A.-C. CC:         Ledora Bottcher, M.D.             Cristopher Estimable. Lattie Haw, M.D. [facility]                           Discharge Summary  DATE OF BIRTH:  1950/09/28  HISTORY OF PRESENT ILLNESS:  Anthony [name] is a 60 year old black male who presented with substernal chest discomfort compatible with acute myocardial infarction approximately at 10 a.m.  His electrocardiogram showed ST segment elevation in 1 and V3, versus left ventricular hypertrophy with repolarization changes in February 2000.  He had a positive stress Cardiolite with a small anterolateral and apical ischemia in February 2000, after a syncopal event with exertion.  His risk factors include hypertension, insulin-dependent diabetes mellitus x 15 years, obesity, nd tobacco use.  It was felt that he should be taken to the cardiac catheterization laboratory.  LABORATORY DATA:  Electrocardiogram showed sinus bradycardia, T-wave inversion n lead I, lead II, V5, and V6, with ST segment elevation in V1 through V3.  A chest x-ray showed generalized cardiomegaly with mildly eccentric bronchovascular markings, with no active disease.  CPKs and troponins were negative for a myocardial infarction.  Sodium 134, potassium 3.3, BUN 14, creatinine 0.8.  Liver function tests within normal limits. Subsequent potassium 3.9.  PT 13, PTT 28.  D-dimer 0.32.  H&H 15.1 and 43.3, normal indices, platelets 128, wbcs 6.9.  Room air ABG:  pH of 7.36, PCO2 of 47.7, PO2 of 34.5 on room air.  This was repeated.  The pH showed 7.39, PCO2 of 38.9, PO2 of  119.8 on room air.  HOSPITAL COURSE:  Anthony [name] was taken to the cardiac catheterization laboratory emergently by Dr. Vanna Scotland. [name].  This report showed a 40% mid-LAD lesion. The ejection fraction was 60% without wall motion  abnormalities.  Carotid Dopplers performed, and this did not show any evidence of ICA stenosis, and vertebral artery flow was antegrade.  There was questionable concern about sleep apnea, thus pulmonary did a consultation, and it was felt that the pulse oximetry monitoring overnight, to find out if he did have any significant sleep apnea. his did not show any significant changes.  Pulmonary after reviewing this data did ot feel he had any sleep abnormalities, and felt that he should be treated with weight loss.  This was reviewed in detail with the patient, and given permission to call p.r.n.  It was felt that it was a clean catheterization.  DISPOSITION:  It was felt that he could be discharged home.  DISCHARGE DIAGNOSIS:  Noncardiac chest discomfort associated with an abnormal electrocardiogram.  DISCHARGE MEDICATIONS: 1. Lopressor has been decreased to 25 mg b.i.d. 2. He is asked to resume his Glucophage on June 06, 1999, 1000 mg    q.a.m., 500 mg q.p.m. 3. Hyzaar 500/12.5 mg q.d. 4. Celexa 20 mg q.d. 5. Prevacid 30 mg q.d. 6. Insulin 70/30, 40 units q.a.m. 7. Celebrex 200 mg q.d. 8. Enteric-coated aspirin 325 mg q.d.  INSTRUCTIONS:  He was advised no lifting, driving, sexual activity, or heavy exertion for 24 hours.  He may return to work on Thursday, June 07, 1999. If he has any problems with his catheterization site, he is asked to call our office immediately.  DIET:  To maintain a low-salt, low-fat, low-cholesterol ADA diet.  FOLLOWUP:  He is asked to arrange a follow-up appointment with Dr. Talbert Forest Corrington. DD:  06/05/99 TD:  06/05/99 Job: [id number]

## 2010-10-15 NOTE — Telephone Encounter (Signed)
In process of completing. Expect to be ready for pick-up/fax on Wed 10/17/10.

## 2010-10-17 NOTE — Telephone Encounter (Signed)
Pt informed ready for p/u at front desk.

## 2010-10-17 NOTE — Telephone Encounter (Signed)
Completed.

## 2010-10-19 ENCOUNTER — Encounter: Payer: Self-pay | Admitting: Family Medicine

## 2010-10-31 ENCOUNTER — Encounter: Payer: Self-pay | Admitting: Family Medicine

## 2010-11-07 ENCOUNTER — Encounter: Payer: Self-pay | Admitting: Family Medicine

## 2010-11-07 ENCOUNTER — Ambulatory Visit (INDEPENDENT_AMBULATORY_CARE_PROVIDER_SITE_OTHER): Payer: PRIVATE HEALTH INSURANCE | Admitting: Family Medicine

## 2010-11-07 VITALS — BP 167/83 | HR 84 | Temp 97.7°F | Ht 72.0 in | Wt 213.0 lb

## 2010-11-07 DIAGNOSIS — I10 Essential (primary) hypertension: Secondary | ICD-10-CM

## 2010-11-07 DIAGNOSIS — E118 Type 2 diabetes mellitus with unspecified complications: Secondary | ICD-10-CM

## 2010-11-07 DIAGNOSIS — B351 Tinea unguium: Secondary | ICD-10-CM

## 2010-11-07 DIAGNOSIS — IMO0002 Reserved for concepts with insufficient information to code with codable children: Secondary | ICD-10-CM

## 2010-11-07 DIAGNOSIS — R609 Edema, unspecified: Secondary | ICD-10-CM

## 2010-11-07 DIAGNOSIS — E1165 Type 2 diabetes mellitus with hyperglycemia: Secondary | ICD-10-CM

## 2010-11-07 LAB — BASIC METABOLIC PANEL
BUN: 14 mg/dL (ref 6–23)
CO2: 28 mEq/L (ref 19–32)
Calcium: 9.2 mg/dL (ref 8.4–10.5)
Chloride: 106 mEq/L (ref 96–112)
Creat: 1.15 mg/dL (ref 0.50–1.35)
Glucose, Bld: 55 mg/dL — ABNORMAL LOW (ref 70–99)
Potassium: 3.8 mEq/L (ref 3.5–5.3)
Sodium: 143 mEq/L (ref 135–145)

## 2010-11-07 NOTE — Assessment & Plan Note (Signed)
Trimmed nails today.

## 2010-11-07 NOTE — Assessment & Plan Note (Signed)
Recheck only slightly lower than initial check. Patient would like to check BP at home and call if SBP > 130 consistently.

## 2010-11-07 NOTE — Patient Instructions (Signed)
It was wonderful working with you.  Please follow up in 1-2 months to meet your new PCP.  We are checking labs today.

## 2010-11-07 NOTE — Progress Notes (Signed)
  Subjective:    Patient ID: Anthony [name], male    DOB: May 03, 1951, 60 y.o.   MRN: [id number]  Hypertension   1. Hx Osteomyelitis: See excellent DC Summary by Dr. Loraine Maple for more details. Dx osteomyelitis of 2nd toe, left foot. Now s/p amputation. Weight-bearing. DC by Dr. [name]. No fever/chills, pain, N/V/D. "Feeling good today." Wearing normal shoes.  2. DMII: Rx Metformin, Novolin 70/30: 50 in am, 10-20 in pm, ASA. Last A1c = 11.3 (previously controlled). He has been checking his BG at home. Exercise = none. Diet: avoiding sugar and bread. Diabetes complications include diabetic macular edema, nephropathy (proteinuria), neuropathy, and retinopathy. He denies CP, DOE, N/V/D, HA, dizziness, increased numbness/tingling in his hands or feet.   3. HTN: Rx Metoprolol, Lasix, Imdur, Cozaar. States that he was rushing to get here today and was nervous when they took BP. Cardiologist is Dr. Verl Blalock with [facility] - he is making an appointment. Last myoview in 2009: EF 68%, no ischemia. Note: Amlodipine caused peripheral edema.  4. LE Edema: Patient increased Lasix on own to BID. Much improved.  Review of Systems SEE HPI.    Objective:   Physical Exam  Vitals reviewed. Constitutional: He appears well-developed and well-nourished. No distress.  Cardiovascular: Normal rate, regular rhythm, normal heart sounds and intact distal pulses.   Pulmonary/Chest: Effort normal and breath sounds normal.  Psychiatric: He has a normal mood and affect.  Extremities: Left Foot s/p 2nd toe amputation. Site c/d/i. Bilateral onychomycosis.    Assessment & Plan:

## 2010-11-07 NOTE — Assessment & Plan Note (Signed)
Discussed Lasix Rx with patient. Okay that he doubled during acute LE edema. Advised to take extra KCL if he does and to let us know when it happens. Check BMP today. If increasing for flare, limit extra doses to a few days.

## 2010-11-07 NOTE — Assessment & Plan Note (Signed)
BG in 100s usually per patient. Not due for A1c today.

## 2010-11-15 ENCOUNTER — Telehealth: Payer: Self-pay | Admitting: Family Medicine

## 2010-11-15 NOTE — Telephone Encounter (Signed)
Pt wanting results of labs

## 2010-11-16 ENCOUNTER — Telehealth: Payer: Self-pay | Admitting: *Deleted

## 2010-11-16 NOTE — Telephone Encounter (Signed)
Called pt and informed of lab results. Anthony [name], Anthony [name]

## 2010-11-16 NOTE — Telephone Encounter (Signed)
Left message for patient to return call. Please see message below from Dr Juleen China.Anthony [name], Anthony [name]

## 2010-11-16 NOTE — Telephone Encounter (Signed)
Anthony [name] has returned the call.

## 2010-11-16 NOTE — Telephone Encounter (Signed)
Labs were normal with the exception of low blood sugar (56). Please ask Anthony [name] to monitor this carefully while on insulin.

## 2010-12-06 ENCOUNTER — Ambulatory Visit: Payer: PRIVATE HEALTH INSURANCE | Admitting: Cardiology

## 2011-01-22 ENCOUNTER — Other Ambulatory Visit: Payer: Self-pay | Admitting: Family Medicine

## 2011-01-23 NOTE — Telephone Encounter (Signed)
Refill request

## 2011-02-02 ENCOUNTER — Inpatient Hospital Stay (INDEPENDENT_AMBULATORY_CARE_PROVIDER_SITE_OTHER)
Admission: RE | Admit: 2011-02-02 | Discharge: 2011-02-02 | Disposition: A | Discharge status: Home / Self Care | Payer: PRIVATE HEALTH INSURANCE | Source: Ambulatory Visit | Attending: Emergency Medicine | Admitting: Emergency Medicine

## 2011-02-02 DIAGNOSIS — K047 Periapical abscess without sinus: Secondary | ICD-10-CM

## 2011-02-28 ENCOUNTER — Encounter: Payer: Self-pay | Admitting: Family Medicine

## 2011-02-28 ENCOUNTER — Ambulatory Visit (INDEPENDENT_AMBULATORY_CARE_PROVIDER_SITE_OTHER): Payer: PRIVATE HEALTH INSURANCE | Admitting: Family Medicine

## 2011-02-28 VITALS — BP 165/92 | HR 75 | Temp 98.2°F | Ht 72.0 in | Wt 205.0 lb

## 2011-02-28 DIAGNOSIS — E1165 Type 2 diabetes mellitus with hyperglycemia: Secondary | ICD-10-CM

## 2011-02-28 DIAGNOSIS — Z23 Encounter for immunization: Secondary | ICD-10-CM

## 2011-02-28 DIAGNOSIS — I10 Essential (primary) hypertension: Secondary | ICD-10-CM

## 2011-02-28 DIAGNOSIS — E119 Type 2 diabetes mellitus without complications: Secondary | ICD-10-CM

## 2011-02-28 DIAGNOSIS — B353 Tinea pedis: Secondary | ICD-10-CM

## 2011-02-28 DIAGNOSIS — IMO0002 Reserved for concepts with insufficient information to code with codable children: Secondary | ICD-10-CM

## 2011-02-28 LAB — POCT GLYCOSYLATED HEMOGLOBIN (HGB A1C): Hemoglobin A1C: 10.4

## 2011-02-28 MED ORDER — SIMVASTATIN 20 MG PO TABS: 20.0000 mg | ORAL_TABLET | Freq: Every day | ORAL | Status: DC

## 2011-02-28 MED ORDER — ASPIRIN 81 MG PO TBEC: 81.0000 mg | DELAYED_RELEASE_TABLET | Freq: Every day | ORAL | Status: DC

## 2011-02-28 MED ORDER — AMLODIPINE BESYLATE 10 MG PO TABS: 10.0000 mg | ORAL_TABLET | Freq: Every day | ORAL | Status: DC

## 2011-02-28 MED ORDER — INSULIN NPH ISOPHANE & REGULAR (70-30) 100 UNIT/ML ~~LOC~~ SUSP: SUBCUTANEOUS | Status: DC

## 2011-02-28 MED ORDER — KETOCONAZOLE 2 % EX CREA: TOPICAL_CREAM | Freq: Two times a day (BID) | CUTANEOUS | Status: DC

## 2011-02-28 MED ORDER — LOSARTAN POTASSIUM 50 MG PO TABS: 100.0000 mg | ORAL_TABLET | Freq: Every day | ORAL | Status: DC

## 2011-02-28 MED ORDER — FUROSEMIDE 40 MG PO TABS: 40.0000 mg | ORAL_TABLET | Freq: Every day | ORAL | Status: DC

## 2011-02-28 MED ORDER — PAROXETINE HCL 20 MG PO TABS: 30.0000 mg | ORAL_TABLET | ORAL | Status: DC

## 2011-02-28 MED ORDER — POTASSIUM CHLORIDE 20 MEQ PO PACK: 20.0000 meq | PACK | Freq: Two times a day (BID) | ORAL | Status: DC

## 2011-02-28 MED ORDER — METFORMIN HCL 1000 MG PO TABS: 1000.0000 mg | ORAL_TABLET | Freq: Two times a day (BID) | ORAL | Status: DC

## 2011-02-28 MED ORDER — ISOSORBIDE MONONITRATE ER 60 MG PO TB24: 60.0000 mg | ORAL_TABLET | Freq: Every day | ORAL | Status: DC

## 2011-02-28 NOTE — Progress Notes (Signed)
Anthony [name] presents to clinic today to followup his blood pressure and his diabetes.  Hypertension: Has stopped taking everything except for his metoprolol due to cost.  He denies any chest pains palpitations syncope or dyspnea on exertion.  He feels well otherwise  Diabetes: A1c currently 10.4. He is taking 7030 insulin 60 units in the morning and 10 units at night.  He checks his blood sugar 2-3 times a month and cannot remember his measurements.  He's had a left toe amputation to osteomyelitis.  He denies any current foot ulcers but notes that he doesn't inspect his feet.    PMH reviewed.  ROS as above otherwise neg Medications reviewed.  Exam:  BP 165/92  Pulse 75  Temp(Src) 98.2 F (36.8 C) (Oral)  Ht 6' (1.829 m)  Wt 205 lb (92.987 kg)  BMI 27.80 kg/m2 Gen: Well NAD HEENT: EOMI,  MMM Lungs: CTABL Nl WOB Heart: RRR no MRG Abd: NABS, NT, ND Exts: Non edematous BL  LE, right foot with excessive skin buildup, thickened nails 0/4 to monofilament. No ulcers noted. Left foot with second toe removed clean scar excessive skin buildup and thickened nails. No ulcers.

## 2011-02-28 NOTE — Patient Instructions (Signed)
Thank you for coming in today. Check your blood sugar 3 x a day and send me the records. Start taking 20 units of insulin at night.  Clean your feet like we talked about.  Come back in 1 month for labs and a blood pressure check.  Take your potassium twice a day.

## 2011-02-28 NOTE — Assessment & Plan Note (Signed)
Encourage good foot hygiene and describe ketoconazole cream. We'll follow this up in one month

## 2011-02-28 NOTE — Assessment & Plan Note (Signed)
A1c out of range. He's not checking his blood sugar therefore it's difficult to titrate at this time. Plan to continue 7030 60 units in the morning and 20 units at night. Asked Anthony [name] to check his blood sugar 3 times a day and bring log to next visit. We'll followup in one month.

## 2011-02-28 NOTE — Assessment & Plan Note (Signed)
Noncompliant with medications. Plan to restart amlodipine Imdur and losartan.  We'll recheck blood pressure next visit and obtain labs

## 2011-03-22 ENCOUNTER — Telehealth: Payer: Self-pay | Admitting: *Deleted

## 2011-03-22 NOTE — Telephone Encounter (Signed)
Pt is wanting to know why his prescriptions are only made out for 30 days instead of 90 days. He states that he was on the 90 day plan and does not understand why Dr. Georgina Snell decided to make this change without consulting him first.  Please respond.Audelia Hives [place]

## 2011-03-26 NOTE — Telephone Encounter (Signed)
Called and left a message and asked that he call his pharmacy to get his rxs switched over to 90 days. He will call back if he is unable to do this.

## 2011-04-04 ENCOUNTER — Encounter: Payer: Self-pay | Admitting: Family Medicine

## 2011-04-04 ENCOUNTER — Ambulatory Visit (INDEPENDENT_AMBULATORY_CARE_PROVIDER_SITE_OTHER): Payer: PRIVATE HEALTH INSURANCE | Admitting: Family Medicine

## 2011-04-04 DIAGNOSIS — I10 Essential (primary) hypertension: Secondary | ICD-10-CM

## 2011-04-04 NOTE — Progress Notes (Signed)
Anthony Anthony [name] presents to clinic today to followup his hypertension. In the interim he has not changed his blood pressure medications at all.  He currently is only taking metoprolol.  He says that he hasn't filled the blood pressure medications due to cost.  He says he has the money will by blood pressure medications today.  Currently feels well denies any headaches  Weakness, chest pains palpitations dyspnea or edema.  PMH reviewed.  ROS as above otherwise neg Medications reviewed. Current Outpatient Prescriptions  Medication Sig Dispense Refill  . aspirin 81 MG EC tablet Take 1 tablet (81 mg total) by mouth daily.  30 tablet  12  . HYDROcodone-acetaminophen (NORCO) 10-325 MG per tablet Take 1 tablet by mouth 4 (four) times daily as needed.       . insulin NPH-insulin regular (NOVOLIN 70/30) (70-30) 100 UNIT/ML injection 60 units in am and 20 units in PM  10 mL  12  . isosorbide mononitrate (IMDUR) 60 MG 24 hr tablet Take 1 tablet (60 mg total) by mouth daily.  60 tablet  12  . metFORMIN (GLUCOPHAGE) 1000 MG tablet Take 1 tablet (1,000 mg total) by mouth 2 (two) times daily.  60 tablet  12  . metoprolol (LOPRESSOR) 100 MG tablet Take 100 mg by mouth 2 (two) times daily.        . Multiple Vitamin (MULTIVITAMIN) tablet Take 1 tablet by mouth daily.        . nitroGLYCERIN (NITROSTAT) 0.4 MG SL tablet Place 0.4 mg under the tongue every 5 (five) minutes as needed. If no relief by 3rd tab, call 911       . Omega-3 Fatty Acids (FISH OIL) 1000 MG CAPS Take 1 capsule by mouth daily.        Marland Kitchen oxyCODONE (OXYCONTIN) 10 MG 12 hr tablet Take 10 mg by mouth 3 (three) times daily. This was rx'd by pain md       . PARoxetine (PAXIL) 20 MG tablet Take 1.5 tablets (30 mg total) by mouth every morning. Take 1 & 1/2 tabs by mouth daily  45 tablet  3  . simvastatin (ZOCOR) 20 MG tablet Take 1 tablet (20 mg total) by mouth at bedtime.  30 tablet  12  . amLODipine (NORVASC) 10 MG tablet Take 1 tablet (10 mg total) by mouth  daily.  30 tablet  11  . furosemide (LASIX) 40 MG tablet Take 1 tablet (40 mg total) by mouth daily.  30 tablet  2  . ketoconazole (NIZORAL) 2 % cream Apply topically 2 (two) times daily.  75 g  0  . KLOR-CON M10 10 MEQ tablet TAKE 2 TABLETS BY MOUTH ONCE DAILY  180 tablet  4  . lansoprazole (PREVACID) 30 MG capsule Take 30 mg by mouth daily.        Marland Kitchen losartan (COZAAR) 50 MG tablet Take 2 tablets (100 mg total) by mouth daily.  60 tablet  3  . nystatin (MYCOSTATIN) cream Apply to penis twice as day as needed for yeast      . potassium chloride (KLOR-CON) 20 MEQ packet Take 20 mEq by mouth 2 (two) times daily.  60 tablet  2      Exam:  BP 200/100  Pulse 67  Temp(Src) 98.2 F (36.8 C) (Oral)  Ht 6' (1.829 m)  Wt 203 lb (92.08 kg)  BMI 27.53 kg/m2 Gen: Well NAD HEENT: EOMI,  MMM Lungs: CTABL Nl WOB Heart: RRR no MRG Abd: NABS, NT, ND Exts:  Non edematous BL  LE, Anthony [name] and well perfused.

## 2011-04-04 NOTE — Assessment & Plan Note (Signed)
Bp elevated significantly today. However he does not have any symptoms therefore he does not have hypertensive emergency.  Plan to encourage [name] to take his blood pressure medications that have been prescribed. I warned of the consequences of not taking blood pressure medications including stroke disability heart attack and death. Plan to followup in less than one week or sooner if any symptoms.

## 2011-04-04 NOTE — Patient Instructions (Signed)
Thank you for coming in today. Please please start taking some blood pressure medications.  See me in 1 week.  Let me know if you have chest pains, or a bad headache or feel bad.

## 2011-04-09 ENCOUNTER — Telehealth: Payer: Self-pay | Admitting: Family Medicine

## 2011-04-09 NOTE — Telephone Encounter (Addendum)
Patient relates that he asked 3 times for rx for 90 day supply of medications not 30 days but his doctor would not do it and he does not know why.   Also feels that sometimes he does not listen.    Also spoke with Ms Beltrame.  She feels Dr Georgina Snell did not listen particularly about naprosyn which helps her pain but that he is concerned about how it effects her blood pressure and did not refill it.  I related that I would discuss with Dr Georgina Snell and he would give them each a call and discuss.

## 2011-04-09 NOTE — Telephone Encounter (Signed)
Called cell and left message also spoke with his wife and left message to call

## 2011-04-10 NOTE — Telephone Encounter (Signed)
Called back. Should have 40 da supplies. Will follow up in clinic on Friday.

## 2011-04-12 ENCOUNTER — Ambulatory Visit (INDEPENDENT_AMBULATORY_CARE_PROVIDER_SITE_OTHER): Payer: PRIVATE HEALTH INSURANCE | Admitting: Family Medicine

## 2011-04-12 ENCOUNTER — Encounter: Payer: Self-pay | Admitting: Family Medicine

## 2011-04-12 DIAGNOSIS — E118 Type 2 diabetes mellitus with unspecified complications: Secondary | ICD-10-CM

## 2011-04-12 DIAGNOSIS — I10 Essential (primary) hypertension: Secondary | ICD-10-CM

## 2011-04-12 DIAGNOSIS — E1165 Type 2 diabetes mellitus with hyperglycemia: Secondary | ICD-10-CM

## 2011-04-12 DIAGNOSIS — IMO0002 Reserved for concepts with insufficient information to code with codable children: Secondary | ICD-10-CM

## 2011-04-12 NOTE — Assessment & Plan Note (Signed)
Blood pressure not well controlled but better than last time.  Plan to add amlodipine and will followup in 2 months or sooner if needed.

## 2011-04-12 NOTE — Assessment & Plan Note (Signed)
Not as well controlled as I would like with an A1c at 10.4  On October 4.  His current fasting sugars indicate that his A1c may be better when rechecked in January.  Plan to continue current therapy and recheck A1c in January. If not   In goal range which for him is less than 8  Will focus very hard on diet and detailed titration.  Followup in 2 months

## 2011-04-12 NOTE — Progress Notes (Signed)
Here to follow up BP and DM: 1) BP:   Now taking Toprol, losartan,  And isosorbide.   Not taking amlodipine.   No chest pain syncope palpitations dyspnea.   Feels well.   Plans to start taking amlodipine this month.  2)  Diabetes:  Taking 70/30 60 units in the morning and 20 units in the evening. Notes that blood sugars fasting usually run between 150 and 200. Denies any symptomatic hypoglycemic or hyperglycemic episodes.  No polyuria or polydipsia.  Eats consistently.  PMH reviewed.  ROS as above otherwise neg Medications reviewed. Current Outpatient Prescriptions  Medication Sig Dispense Refill  . aspirin 81 MG EC tablet Take 1 tablet (81 mg total) by mouth daily.  30 tablet  12  . furosemide (LASIX) 40 MG tablet Take 1 tablet (40 mg total) by mouth daily.  30 tablet  2  . HYDROcodone-acetaminophen (NORCO) 10-325 MG per tablet Take 1 tablet by mouth 4 (four) times daily as needed.       . insulin NPH-insulin regular (NOVOLIN 70/30) (70-30) 100 UNIT/ML injection 60 units in am and 20 units in PM  10 mL  12  . isosorbide mononitrate (IMDUR) 60 MG 24 hr tablet Take 1 tablet (60 mg total) by mouth daily.  60 tablet  12  . KLOR-CON M10 10 MEQ tablet TAKE 2 TABLETS BY MOUTH ONCE DAILY  180 tablet  4  . losartan (COZAAR) 50 MG tablet Take 50 mg by mouth daily.        . metFORMIN (GLUCOPHAGE) 1000 MG tablet Take 1 tablet (1,000 mg total) by mouth 2 (two) times daily.  60 tablet  12  . metoprolol (LOPRESSOR) 100 MG tablet Take 100 mg by mouth 2 (two) times daily.        . Multiple Vitamin (MULTIVITAMIN) tablet Take 1 tablet by mouth daily.        . Omega-3 Fatty Acids (FISH OIL) 1000 MG CAPS Take 1 capsule by mouth daily.        Marland Kitchen oxyCODONE (OXYCONTIN) 10 MG 12 hr tablet Take 10 mg by mouth 3 (three) times daily. This was rx'd by pain md       . PARoxetine (PAXIL) 20 MG tablet Take 1.5 tablets (30 mg total) by mouth every morning. Take 1 & 1/2 tabs by mouth daily  45 tablet  3  . simvastatin (ZOCOR)  20 MG tablet Take 1 tablet (20 mg total) by mouth at bedtime.  30 tablet  12  . DISCONTD: losartan (COZAAR) 50 MG tablet Take 2 tablets (100 mg total) by mouth daily.  60 tablet  3  . amLODipine (NORVASC) 10 MG tablet Take 1 tablet (10 mg total) by mouth daily.  30 tablet  11  . ketoconazole (NIZORAL) 2 % cream Apply topically 2 (two) times daily.  75 g  0  . nitroGLYCERIN (NITROSTAT) 0.4 MG SL tablet Place 0.4 mg under the tongue every 5 (five) minutes as needed. If no relief by 3rd tab, call 911         Exam:  BP 150/86  Pulse 60  Ht 6' (1.829 m)  Wt 204 lb 9.6 oz (92.806 kg)  BMI 27.75 kg/m2 Gen: Well NAD HEENT: EOMI,  MMM Lungs: CTABL Nl WOB Heart: RRR no MRG Abd: NABS, NT, ND Exts: Non edematous BL  LE, warm and well perfused.

## 2011-04-12 NOTE — Patient Instructions (Addendum)
Thank you for coming in today. I am worried about sleep apnea and your blood pressure.  Please fill the amlodipine and take it daily.  Keep working on your blood sugar.  See me in January. Good luck this holiday season.  Try to avoid carbs.

## 2011-04-22 ENCOUNTER — Other Ambulatory Visit: Payer: Self-pay | Admitting: Family Medicine

## 2011-04-22 NOTE — Telephone Encounter (Signed)
Refill request

## 2011-04-26 ENCOUNTER — Ambulatory Visit (INDEPENDENT_AMBULATORY_CARE_PROVIDER_SITE_OTHER): Payer: PRIVATE HEALTH INSURANCE | Admitting: Family Medicine

## 2011-04-26 ENCOUNTER — Encounter: Payer: Self-pay | Admitting: Family Medicine

## 2011-04-26 VITALS — BP 154/84 | HR 66 | Temp 98.4°F | Ht 72.0 in | Wt 200.0 lb

## 2011-04-26 DIAGNOSIS — E118 Type 2 diabetes mellitus with unspecified complications: Secondary | ICD-10-CM

## 2011-04-26 DIAGNOSIS — E1165 Type 2 diabetes mellitus with hyperglycemia: Secondary | ICD-10-CM

## 2011-04-26 DIAGNOSIS — IMO0002 Reserved for concepts with insufficient information to code with codable children: Secondary | ICD-10-CM

## 2011-04-26 DIAGNOSIS — I10 Essential (primary) hypertension: Secondary | ICD-10-CM

## 2011-04-26 MED ORDER — LOSARTAN POTASSIUM 100 MG PO TABS: 100.0000 mg | ORAL_TABLET | Freq: Every day | ORAL | Status: DC

## 2011-04-26 NOTE — Patient Instructions (Signed)
Thank you for coming in today. Send those lab results to me. I will call you with what they mean and start treatment if we need to.  Finish your losartan and when you pick up the next bottle it will be a bigger dose. Start the 100mg  tablets daily.  Come back in 2 months.

## 2011-04-29 ENCOUNTER — Telehealth: Payer: Self-pay | Admitting: Family Medicine

## 2011-04-29 NOTE — Assessment & Plan Note (Signed)
Dm not well controlled by A1c.  Encouraged Anthony [name] to bring in his log book at the next visit. I suspect that his glucose is not as well controlled as he told me.  We will follow.

## 2011-04-29 NOTE — Telephone Encounter (Signed)
Called Mr Spieker about his Testosterone labs from [place]. They are low. We will discuss options at the next visit.  200mg /ml 24ml is $100.

## 2011-04-29 NOTE — Progress Notes (Signed)
Anthony [name] presents to clinic today to follow up some labs that he obtained at an outside lab. However her forgot to send his records over.   While he is here he would like to discuss his diabetes. He is taking his insulin as listed below. He forgot his log book or meter but thinks that he glucoses are usually from 150-250.He feels well. No lows or highs.   PMH reviewed.  ROS as above otherwise neg Medications reviewed. Current Outpatient Prescriptions  Medication Sig Dispense Refill  . amLODipine (NORVASC) 10 MG tablet Take 1 tablet (10 mg total) by mouth daily.  30 tablet  11  . aspirin 81 MG EC tablet Take 1 tablet (81 mg total) by mouth daily.  30 tablet  12  . furosemide (LASIX) 40 MG tablet Take 1 tablet (40 mg total) by mouth daily.  30 tablet  2  . HYDROcodone-acetaminophen (NORCO) 10-325 MG per tablet Take 1 tablet by mouth 4 (four) times daily as needed.       . insulin NPH-insulin regular (NOVOLIN 70/30) (70-30) 100 UNIT/ML injection 60 units in am and 20 units in PM  10 mL  12  . isosorbide mononitrate (IMDUR) 60 MG 24 hr tablet Take 1 tablet (60 mg total) by mouth daily.  60 tablet  12  . KLOR-CON M10 10 MEQ tablet TAKE 2 TABLETS BY MOUTH ONCE DAILY  180 tablet  4  . losartan (COZAAR) 100 MG tablet Take 1 tablet (100 mg total) by mouth daily.  90 tablet  2  . metFORMIN (GLUCOPHAGE) 1000 MG tablet Take 1 tablet (1,000 mg total) by mouth 2 (two) times daily.  60 tablet  12  . metoprolol (LOPRESSOR) 100 MG tablet Take 100 mg by mouth 2 (two) times daily.        . Multiple Vitamin (MULTIVITAMIN) tablet Take 1 tablet by mouth daily.        . nitroGLYCERIN (NITROSTAT) 0.4 MG SL tablet Place 0.4 mg under the tongue every 5 (five) minutes as needed. If no relief by 3rd tab, call 911       . Omega-3 Fatty Acids (FISH OIL) 1000 MG CAPS Take 1 capsule by mouth daily.        Marland Kitchen oxyCODONE (OXYCONTIN) 10 MG 12 hr tablet Take 10 mg by mouth 3 (three) times daily. This was rx'd by pain md       .  PARoxetine (PAXIL) 20 MG tablet TAKE 1 & 1/2 TABLET BY MOUTH ONCE A DAY  135 tablet  3  . simvastatin (ZOCOR) 20 MG tablet Take 1 tablet (20 mg total) by mouth at bedtime.  30 tablet  12  . ketoconazole (NIZORAL) 2 % cream Apply topically 2 (two) times daily.  75 g  0    Exam:  BP 154/84  Pulse 66  Temp(Src) 98.4 F (36.9 C) (Oral)  Ht 6' (1.829 m)  Wt 200 lb (90.719 kg)  BMI 27.12 kg/m2 Gen: Well NAD Lungs: CTABL Nl WOB Heart: RRR no MRG Abd: NABS, NT, ND Exts: Non edematous BL  LE, warm and well perfused.

## 2011-06-19 ENCOUNTER — Encounter: Payer: Self-pay | Admitting: Family Medicine

## 2011-06-24 ENCOUNTER — Other Ambulatory Visit: Payer: Self-pay | Admitting: Family Medicine

## 2011-06-24 NOTE — Telephone Encounter (Signed)
Refill request

## 2011-06-27 ENCOUNTER — Other Ambulatory Visit: Payer: Self-pay | Admitting: Family Medicine

## 2011-06-27 NOTE — Telephone Encounter (Signed)
Refill request

## 2011-06-28 ENCOUNTER — Other Ambulatory Visit: Payer: Self-pay | Admitting: Family Medicine

## 2011-06-28 MED ORDER — FUROSEMIDE 40 MG PO TABS: 40.0000 mg | ORAL_TABLET | Freq: Every day | ORAL | Status: DC

## 2011-07-01 ENCOUNTER — Other Ambulatory Visit: Payer: Self-pay | Admitting: Family Medicine

## 2011-07-19 ENCOUNTER — Other Ambulatory Visit: Payer: Self-pay | Admitting: Cardiology

## 2011-07-26 ENCOUNTER — Other Ambulatory Visit: Payer: Self-pay

## 2011-07-26 ENCOUNTER — Other Ambulatory Visit: Payer: Self-pay | Admitting: Cardiology

## 2011-07-26 MED ORDER — SIMVASTATIN 20 MG PO TABS: 20.0000 mg | ORAL_TABLET | Freq: Every day | ORAL | Status: DC

## 2011-07-26 NOTE — Telephone Encounter (Signed)
He out of pills he has called pharmacy three times to get filled

## 2011-08-01 ENCOUNTER — Encounter: Payer: Self-pay | Admitting: Family Medicine

## 2011-08-01 ENCOUNTER — Other Ambulatory Visit: Payer: Self-pay | Admitting: Family Medicine

## 2011-08-01 DIAGNOSIS — G894 Chronic pain syndrome: Secondary | ICD-10-CM

## 2011-08-01 DIAGNOSIS — Z789 Other specified health status: Secondary | ICD-10-CM | POA: Insufficient documentation

## 2011-08-11 ENCOUNTER — Other Ambulatory Visit: Payer: Self-pay | Admitting: Family Medicine

## 2011-08-11 NOTE — Telephone Encounter (Signed)
Refill request

## 2011-09-03 ENCOUNTER — Ambulatory Visit: Payer: Self-pay | Admitting: Family Medicine

## 2011-09-04 ENCOUNTER — Encounter: Payer: Self-pay | Admitting: Family Medicine

## 2011-09-04 ENCOUNTER — Ambulatory Visit (INDEPENDENT_AMBULATORY_CARE_PROVIDER_SITE_OTHER): Payer: PRIVATE HEALTH INSURANCE | Admitting: Family Medicine

## 2011-09-04 VITALS — BP 130/72 | Temp 97.9°F | Ht 72.0 in | Wt 211.0 lb

## 2011-09-04 DIAGNOSIS — G5620 Lesion of ulnar nerve, unspecified upper limb: Secondary | ICD-10-CM

## 2011-09-04 DIAGNOSIS — IMO0002 Reserved for concepts with insufficient information to code with codable children: Secondary | ICD-10-CM

## 2011-09-04 DIAGNOSIS — E118 Type 2 diabetes mellitus with unspecified complications: Secondary | ICD-10-CM

## 2011-09-04 DIAGNOSIS — E1165 Type 2 diabetes mellitus with hyperglycemia: Secondary | ICD-10-CM

## 2011-09-04 LAB — POCT GLYCOSYLATED HEMOGLOBIN (HGB A1C): Hemoglobin A1C: 10.5

## 2011-09-04 NOTE — Patient Instructions (Signed)
Thank you for coming in today. I think you are right about your diabetes. It is high because of not taking insulin and eating too many carbohydrates. The first goal should be to take your insulin twice a day everyday. The second goal should be to work hard on decreasing the amount of starches and sugars you drink or eat Keep track of your sugar, and come see me in one month.

## 2011-09-04 NOTE — Assessment & Plan Note (Signed)
Poorly controlled diabetes likely do to insulin noncompliance and dietary indiscretion.  Discussed ways and strategies to improve diabetes compliance. Used motivational interviewing. Patient states that he will take his insulin twice daily every day and try to eat less carbohydrates including decrease sugar sweetened beverages. Plan to followup within one month

## 2011-09-04 NOTE — Progress Notes (Addendum)
Subjective:     Patient ID: Anthony [name], male   DOB: June 10, 1950, 61 y.o.   MRN: [id number]  HPI  Anthony [name] is a 61 yo man presenting for f/u on his DMII.  1. DM: Anthony [name] HgbA1c is 10.5 today, up from 10.4 in October. Blood sugars at home run in the 200s. His lowest has been 130, high 250. He has had felt dizzy/shakey, though rarely - BG 60 then. He often forgets (3 out of 7 evenings) his 15units of insulin 70/30 in the evening. He rarely misses his 70/30 60units in the AM.  He has attempted to limit fried food, however he admits that he eats fast food and starches often. He stays active at his work and walks frequently; otherwise, he does not actively exercise.  He gets yearly ophtho exams. Does not check feet at home. Not fasting for labs today.  2. Tingling: c/o tingling in BL medial forearms and 4th/5th digits the AM upon waking. Tingling lasts for <1 day and disappears on its own. Does not wake him from sleep. There is no associated pain, weakness, or change in sensation. He sleeps with his arms flexed throughout the night. No associated neck pain/discomfort, aside from 'clicking'. Movements in neck do not elicit tingling. His tennis elbow wrap significantly helps symptoms.  Review of Systems     Objective:   Physical Exam Gen: Well appearing, NAD CV: RRR, no m/r/g Lungs: CTAB, normal WOB Skin: Feet without abrasions or ulcers.  Ext: Amputated L 2nd toe. Neuro: Sensation grossly intact in hands. 4/4 monofilament sensation in BL feet. MSK: Neck: Full ROM without pain. No TTP.     Assessment:      Plan:    Anthony [name] is a 61 yo for f/u of his DM. His A1c is still uncontrolled due to diet and lack of insulin use. We will keep his insulin regimen for now, and Anthony [name] expressed interest in attempting to adhere to the regimen as well as diet for 1 month. Anthony [name] was asked to keep a log of his FBG in the AM, prior to eating. We will evaluate the need to increase his insulin at the  next visit.   2. Tingling in hands are in his 4th/5th digits and medial forearm in the ulnar distribution. Impingement is likely in originating in his elbow. There is no concern for etiology in his neck, given his full ROM, lac of bony tenderness, and no radiating pain. Pt advised to continue use of his tennis elbow wrap and avoid completely flexing arms throughout night.      PGY 3 note addendum: I saw the patient with the medical student and performed an independent history and physical. History of present illness: As above agree completely. Exam: Exam:  BP 130/72  Temp(Src) 97.9 F (36.6 C) (Oral)  Ht 6' (1.829 m)  Wt 211 lb (95.709 kg)  BMI 28.62 kg/m2 Gen: Well NAD HEENT: EOMI,  MMM Lungs: CTABL Nl WOB Heart: RRR no MRG Abd: NABS, NT, ND Neuro: Good strength normal bilaterally with normal sensation throughout hands. Neck: No posterior midline tenderness normal range of motion negative Spurling's test. Feet: Monofilament 4 x 4 bilaterally left second toe amputated no ulcers present.

## 2011-09-05 DIAGNOSIS — G5620 Lesion of ulnar nerve, unspecified upper limb: Secondary | ICD-10-CM | POA: Insufficient documentation

## 2011-09-05 NOTE — Assessment & Plan Note (Signed)
Bilateral and mild. Advised some behavioral changes such as trying or up his elbows up with a towel at night. Follow up with me in one month if very severe we'll consider specialist bracing referral to sports medicine orthopedics. Patient expresses understanding

## 2011-10-09 ENCOUNTER — Ambulatory Visit (INDEPENDENT_AMBULATORY_CARE_PROVIDER_SITE_OTHER): Payer: PRIVATE HEALTH INSURANCE | Admitting: Family Medicine

## 2011-10-09 ENCOUNTER — Encounter: Payer: Self-pay | Admitting: Family Medicine

## 2011-10-09 VITALS — BP 147/82 | HR 58 | Temp 99.2°F | Ht 72.0 in | Wt 203.1 lb

## 2011-10-09 DIAGNOSIS — G8929 Other chronic pain: Secondary | ICD-10-CM | POA: Insufficient documentation

## 2011-10-09 DIAGNOSIS — M542 Cervicalgia: Secondary | ICD-10-CM

## 2011-10-09 DIAGNOSIS — S46119A Strain of muscle, fascia and tendon of long head of biceps, unspecified arm, initial encounter: Secondary | ICD-10-CM

## 2011-10-09 DIAGNOSIS — IMO0002 Reserved for concepts with insufficient information to code with codable children: Secondary | ICD-10-CM

## 2011-10-09 DIAGNOSIS — I10 Essential (primary) hypertension: Secondary | ICD-10-CM

## 2011-10-09 DIAGNOSIS — S43499A Other sprain of unspecified shoulder joint, initial encounter: Secondary | ICD-10-CM

## 2011-10-09 DIAGNOSIS — E118 Type 2 diabetes mellitus with unspecified complications: Secondary | ICD-10-CM

## 2011-10-09 DIAGNOSIS — E1165 Type 2 diabetes mellitus with hyperglycemia: Secondary | ICD-10-CM

## 2011-10-09 NOTE — Assessment & Plan Note (Signed)
I suspect the patient had a rupture of the long head of the biceps tendon.  He has no pain and is asymptomatic with normal function. There is nothing to do here other than observe. He expresses understanding. We'll continue to follow.

## 2011-10-09 NOTE — Assessment & Plan Note (Signed)
Chronic neck pain as his present now for 2-3 months. Does not appear to be terribly bothersome and patient does not have any radiculopathy or weakness.  Plan to obtain x-ray if the pain becomes bothersome but otherwise will follow.

## 2011-10-09 NOTE — Assessment & Plan Note (Signed)
Diabetes not well controlled with hemoglobin A1c of about 10. This is almost certainly secondary to noncompliance. However patient's current glucoses seem to be in reasonable range. He states that he is taking his medications as directed. I am optimistic of improvement. This remains a moderately high risk secondary to his history of a to indication and current poor A1c.  Followup in 2 months for A1c check

## 2011-10-09 NOTE — Progress Notes (Signed)
Anthony [name] is a 61 y.o. male who presents to [facility] today for   1) DM: Doing well. Max 252 and low 78. Usually in the 125-150.  No polyuria polydipsia. Felt bad at 68. Using medications listed below.   2) HTN: Taking medications below. No CP, or dyspnea.   3) Popping and neck pain: Occurs daily. Started about 2-3 months ago. No radiating pain. Notes that pt has reduced rotation ROM of the neck due to pain. Will have occasional clicking with motion. Denies any hand weakness or numbness.   4) Right shoulder pain: A few weeks ago the patient was pushing a heavy box when he felt a pop in his right shoulder.  He then noted a lump on his upper arm.  He denies any current pain weakness or difficulty with arm motion.  His chronic mild shoulder pain resolved after this happened.    PMH: Reviewed significant for hypertension and diabetes History  Substance Use Topics  . Smoking status: Current Everyday Smoker -- 0.5 packs/day    Types: Cigarettes    Last Attempt to Quit: 03/04/2010  . Smokeless tobacco: Never Used  . Alcohol Use: No   ROS as above  Medications reviewed. Current Outpatient Prescriptions  Medication Sig Dispense Refill  . amLODipine (NORVASC) 10 MG tablet TAKE ONE TABLET BY MOUTH EVERY DAY  90 tablet  3  . aspirin 81 MG EC tablet Take 1 tablet (81 mg total) by mouth daily.  30 tablet  12  . furosemide (LASIX) 40 MG tablet Take 1 tablet (40 mg total) by mouth daily.  90 tablet  2  . insulin NPH-insulin regular (NOVOLIN 70/30) (70-30) 100 UNIT/ML injection 60 units in am and 20 units in PM  10 mL  12  . isosorbide mononitrate (IMDUR) 60 MG 24 hr tablet Take 1 tablet (60 mg total) by mouth daily.  60 tablet  12  . KLOR-CON M10 10 MEQ tablet TAKE 2 TABLETS BY MOUTH ONCE DAILY  180 tablet  4  . losartan (COZAAR) 100 MG tablet Take 1 tablet (100 mg total) by mouth daily.  90 tablet  2  . metFORMIN (GLUCOPHAGE) 1000 MG tablet TAKE 1 TABLET BY MOUTH TWICE A DAY  180 tablet  0  .  metoprolol (LOPRESSOR) 100 MG tablet 1 TABLET BY MOUTH TWO TIMES A DAY FOR BLOOD PRESSURE  180 tablet  3  . Multiple Vitamin (MULTIVITAMIN) tablet Take 1 tablet by mouth daily.        . Omega-3 Fatty Acids (FISH OIL) 1000 MG CAPS Take 1 capsule by mouth daily.        Marland Kitchen oxyCODONE (OXYCONTIN) 10 MG 12 hr tablet Take 10 mg by mouth 3 (three) times daily. This was rx'd by pain md       . PARoxetine (PAXIL) 20 MG tablet TAKE 1 & 1/2 TABLET BY MOUTH ONCE A DAY  135 tablet  3  . simvastatin (ZOCOR) 20 MG tablet Take 1 tablet (20 mg total) by mouth at bedtime.  30 tablet  12  . HYDROcodone-acetaminophen (NORCO) 10-325 MG per tablet Take 1 tablet by mouth 4 (four) times daily as needed.       . nitroGLYCERIN (NITROSTAT) 0.4 MG SL tablet Place 0.4 mg under the tongue every 5 (five) minutes as needed. If no relief by 3rd tab, call 911         Exam:  BP 147/82  Pulse 58  Temp(Src) 99.2 F (37.3 C) (Oral)  Ht  6' (1.829 m)  Wt 203 lb 2 oz (92.137 kg)  BMI 27.55 kg/m2 Gen: Well NAD HEENT: EOMI,  MMM Lungs: CTABL Nl WOB Heart: RRR no MRG Abd: NABS, NT, ND Exts: Non edematous BL  LE, warm and well perfused.  Right arm: Normal shoulder motion and strength. Mildly enlarged biceps on right.   Neck: Nontender over spinal midline. Normal rotational range of motion to the left with decreased on the right secondary to pain. Normal and strength and sensation bilaterally. Gait is normal.  No results found for this or any previous visit (from the past 72 hour(s)).

## 2011-10-09 NOTE — Patient Instructions (Signed)
Thank you for coming in today. Your blood pressure is a little high today.  I think we should try to get it a little big lower.  We will follow up in 2 months.  Please continue the medications listed below.  Please continue to lose weight.  Follow up in 2 months.  We will follow your neck. Continue the pain medicines a needed.  Come back or go to the emergency room if you notice new weakness new numbness problems walking or bowel or bladder problems.

## 2011-10-09 NOTE — Assessment & Plan Note (Signed)
Hypertension not well-controlled on now for medications plus Lasix.  He has a long history of poor patient adherence. He states he's taking all medications as directed and not had any side effects.  Plan to keep medications as they are and recheck in 2 months. The patient is currently losing weight and I feel this is reasonable to see how well the blood pressure is controlled in 2 months.  If still not well controlled will consider spironolactone.

## 2011-10-23 ENCOUNTER — Other Ambulatory Visit: Payer: Self-pay | Admitting: Family Medicine

## 2011-10-23 ENCOUNTER — Other Ambulatory Visit: Payer: Self-pay | Admitting: Cardiology

## 2011-10-23 NOTE — Telephone Encounter (Signed)
Refill- Simvastatin (90 Day Supply)  Verified preferred as [place], patient can be reached at [phone] for additional information.

## 2011-10-29 ENCOUNTER — Other Ambulatory Visit: Payer: Self-pay | Admitting: *Deleted

## 2011-10-30 MED ORDER — INSULIN NPH ISOPHANE & REGULAR (70-30) 100 UNIT/ML ~~LOC~~ SUSP: SUBCUTANEOUS | Status: DC

## 2011-11-06 ENCOUNTER — Ambulatory Visit (INDEPENDENT_AMBULATORY_CARE_PROVIDER_SITE_OTHER): Payer: PRIVATE HEALTH INSURANCE | Admitting: Family Medicine

## 2011-11-06 ENCOUNTER — Encounter: Payer: Self-pay | Admitting: Family Medicine

## 2011-11-06 VITALS — BP 147/87 | HR 49 | Ht 73.0 in | Wt 206.0 lb

## 2011-11-06 DIAGNOSIS — E1165 Type 2 diabetes mellitus with hyperglycemia: Secondary | ICD-10-CM

## 2011-11-06 DIAGNOSIS — Z1211 Encounter for screening for malignant neoplasm of colon: Secondary | ICD-10-CM | POA: Insufficient documentation

## 2011-11-06 DIAGNOSIS — E118 Type 2 diabetes mellitus with unspecified complications: Secondary | ICD-10-CM

## 2011-11-06 DIAGNOSIS — IMO0002 Reserved for concepts with insufficient information to code with codable children: Secondary | ICD-10-CM

## 2011-11-06 DIAGNOSIS — Z125 Encounter for screening for malignant neoplasm of prostate: Secondary | ICD-10-CM

## 2011-11-06 DIAGNOSIS — F329 Major depressive disorder, single episode, unspecified: Secondary | ICD-10-CM

## 2011-11-06 DIAGNOSIS — E291 Testicular hypofunction: Secondary | ICD-10-CM

## 2011-11-06 DIAGNOSIS — F3289 Other specified depressive episodes: Secondary | ICD-10-CM

## 2011-11-06 DIAGNOSIS — R7989 Other specified abnormal findings of blood chemistry: Secondary | ICD-10-CM

## 2011-11-06 DIAGNOSIS — E7800 Pure hypercholesterolemia, unspecified: Secondary | ICD-10-CM

## 2011-11-06 DIAGNOSIS — I10 Essential (primary) hypertension: Secondary | ICD-10-CM

## 2011-11-06 LAB — LDL CHOLESTEROL, DIRECT: Direct LDL: 58 mg/dL

## 2011-11-06 MED ORDER — TESTOSTERONE CYPIONATE 200 MG/ML IM SOLN: 200.0000 mg | INTRAMUSCULAR | Status: DC

## 2011-11-06 MED ORDER — LOSARTAN POTASSIUM 100 MG PO TABS: 100.0000 mg | ORAL_TABLET | Freq: Every day | ORAL | Status: DC

## 2011-11-06 MED ORDER — PAROXETINE HCL 40 MG PO TABS: 40.0000 mg | ORAL_TABLET | Freq: Every day | ORAL | Status: DC

## 2011-11-06 NOTE — Patient Instructions (Addendum)
Thank you for coming in today. 1) Blood pressure: Please take losartan 100 mg daily. 2) low testosterone: Get the prescription and [facility] for injection every 2 weeks.  We will check a prostate level today to make sure that is okay. 3) cholesterol: We will check your cholesterol today. 4) depression: I wrote for 40 mg of Paxil so take one pill a day instead of one and a half  Come back in about one or 2 months for a recheck. Make sure to bring your meter or a sugar log Call or go to the emergency room if you get worse, have trouble breathing, have chest pains, or palpitations.

## 2011-11-06 NOTE — Progress Notes (Signed)
Anthony [name] is a 61 y.o. male who presents to [facility] today for   1) low testosterone: Patient notes fatigue and lack of libido along with erectile dysfunction. He had a testosterone level checked several months ago and it was low.  He is considered his options and would like to start testosterone supplementation therapy. He denies any prostate symptoms such as dribbling incomplete voiding or weak stream. He pays for medications out of pocket.  2) hypertension: Taking medications listed below. However he is still taking losartan 50 mg daily. He denies any chest pains or palpitations, edema or dyspnea.   3) depression: Feels well would like to switch from Paxil 1-1/2 pills a day to a dose that has only one pill a day.  Denies any SI/HI.  4) diabetes: Is taking insulin as listed below. He forgot to take his blood sugar meter with him today. He cannot remember any specific measurements. He denies any symptomatic hypoglycemic episodes polyuria or polydipsia.   PMH: Reviewed significant for hypertension History  Substance Use Topics  . Smoking status: Current Everyday Smoker -- 0.5 packs/day    Types: Cigarettes    Last Attempt to Quit: 03/04/2010  . Smokeless tobacco: Never Used  . Alcohol Use: No   ROS as above  Medications reviewed. Current Outpatient Prescriptions  Medication Sig Dispense Refill  . amLODipine (NORVASC) 10 MG tablet TAKE ONE TABLET BY MOUTH EVERY DAY  90 tablet  3  . aspirin 81 MG EC tablet Take 1 tablet (81 mg total) by mouth daily.  30 tablet  12  . furosemide (LASIX) 40 MG tablet Take 1 tablet (40 mg total) by mouth daily.  90 tablet  2  . HYDROcodone-acetaminophen (NORCO) 10-325 MG per tablet Take 1 tablet by mouth 4 (four) times daily as needed.       . isosorbide mononitrate (IMDUR) 60 MG 24 hr tablet Take 1 tablet (60 mg total) by mouth daily.  60 tablet  12  . KLOR-CON M10 10 MEQ tablet TAKE 2 TABLETS BY MOUTH ONCE DAILY  180 tablet  4  . losartan (COZAAR) 100 MG  tablet Take 1 tablet (100 mg total) by mouth daily.  90 tablet  2  . metFORMIN (GLUCOPHAGE) 1000 MG tablet TAKE 1 TABLET BY MOUTH TWICE A DAY  180 tablet  0  . metoprolol (LOPRESSOR) 100 MG tablet 1 TABLET BY MOUTH TWO TIMES A DAY FOR BLOOD PRESSURE  180 tablet  3  . Multiple Vitamin (MULTIVITAMIN) tablet Take 1 tablet by mouth daily.        . Omega-3 Fatty Acids (FISH OIL) 1000 MG CAPS Take 1 capsule by mouth daily.        Marland Kitchen PARoxetine (PAXIL) 40 MG tablet Take 1 tablet (40 mg total) by mouth daily.  90 tablet  3  . simvastatin (ZOCOR) 20 MG tablet Take 1 tablet (20 mg total) by mouth at bedtime.  30 tablet  12  . DISCONTD: losartan (COZAAR) 100 MG tablet Take 1 tablet (100 mg total) by mouth daily.  90 tablet  2  . DISCONTD: PARoxetine (PAXIL) 20 MG tablet TAKE 1 & 1/2 TABLET BY MOUTH ONCE A DAY  135 tablet  3  . insulin NPH-insulin regular (NOVOLIN 70/30) (70-30) 100 UNIT/ML injection 60 units in am and 20 units in PM  10 mL  12  . nitroGLYCERIN (NITROSTAT) 0.4 MG SL tablet Place 0.4 mg under the tongue every 5 (five) minutes as needed. If no relief by  3rd tab, call 911       . oxyCODONE (OXYCONTIN) 10 MG 12 hr tablet Take 10 mg by mouth 3 (three) times daily. This was rx'd by pain md       . testosterone cypionate (DEPOTESTOTERONE CYPIONATE) 200 MG/ML injection Inject 1 mL (200 mg total) into the muscle every 14 (fourteen) days.  10 mL  0    Exam:  BP 147/87  Pulse 49  Ht 6\' 1"  (1.854 m)  Wt 206 lb (93.441 kg)  BMI 27.18 kg/m2 Gen: Well NAD HEENT: EOMI,  MMM Lungs: CTABL Nl WOB Heart: RRR no MRG Abd: NABS, NT, ND Exts: Non edematous BL  LE, warm and well perfused.   No results found for this or any previous visit (from the past 72 hour(s)).

## 2011-11-06 NOTE — Assessment & Plan Note (Signed)
Blood pressure close to goal. Patient is not taking losartan 100. Plan to increase from 50-100 and followup in one or 2 month.  CMP today

## 2011-11-06 NOTE — Assessment & Plan Note (Signed)
Diabetes not well controlled. Patient cannot primary blood sugar measurements and not brings meter today. He is not due for A1c until next month. Plan to continue regimen as I can't change without any data and followup in one or 2 months

## 2011-11-06 NOTE — Assessment & Plan Note (Signed)
Feel that screening for prostate cancer is warranted prior to starting testosterone supplementation. Discussed risks and benefits of PSA testing including the possibility of false positives and need for biopsies. Patient expresses understanding

## 2011-11-06 NOTE — Assessment & Plan Note (Addendum)
LDL is out of date. Patient is on simvastatin. Plan to check direct LDL and followup

## 2011-11-06 NOTE — Assessment & Plan Note (Signed)
The patient has low testosterone with symptoms including loss of libido, erectile dysfunction and fatigue.  Plan to start testosterone cypronate 278mcg q14 days with a followup in 2 months.  We'll check a level then.   However prior to initiation of testosterone supplementation we'll check a PSA

## 2011-11-07 LAB — COMPLETE METABOLIC PANEL WITH GFR
ALT: 23 U/L (ref 0–53)
AST: 21 U/L (ref 0–37)
Albumin: 3.9 g/dL (ref 3.5–5.2)
Alkaline Phosphatase: 103 U/L (ref 39–117)
BUN: 13 mg/dL (ref 6–23)
CO2: 25 mEq/L (ref 19–32)
Calcium: 9.2 mg/dL (ref 8.4–10.5)
Chloride: 103 mEq/L (ref 96–112)
Creat: 1.03 mg/dL (ref 0.50–1.35)
GFR, Est African American: >89 mL/min
GFR, Est Non African American: 79 mL/min
Glucose, Bld: 283 mg/dL — ABNORMAL HIGH (ref 70–99)
Potassium: 4 mEq/L (ref 3.5–5.3)
Sodium: 137 mEq/L (ref 135–145)
Total Bilirubin: 0.7 mg/dL (ref 0.3–1.2)
Total Protein: 6.6 g/dL (ref 6.0–8.3)

## 2011-11-07 LAB — PSA: PSA: 0.64 ng/mL (ref <=4.00)

## 2012-02-14 ENCOUNTER — Other Ambulatory Visit: Payer: Self-pay | Admitting: Family Medicine

## 2012-02-26 ENCOUNTER — Encounter: Payer: Self-pay | Admitting: Family Medicine

## 2012-02-26 ENCOUNTER — Ambulatory Visit (INDEPENDENT_AMBULATORY_CARE_PROVIDER_SITE_OTHER): Payer: Self-pay | Admitting: Family Medicine

## 2012-02-26 VITALS — BP 145/85 | HR 87 | Temp 98.2°F | Ht 73.0 in | Wt 211.4 lb

## 2012-02-26 DIAGNOSIS — IMO0002 Reserved for concepts with insufficient information to code with codable children: Secondary | ICD-10-CM

## 2012-02-26 DIAGNOSIS — R7989 Other specified abnormal findings of blood chemistry: Secondary | ICD-10-CM

## 2012-02-26 DIAGNOSIS — E291 Testicular hypofunction: Secondary | ICD-10-CM

## 2012-02-26 DIAGNOSIS — E1165 Type 2 diabetes mellitus with hyperglycemia: Secondary | ICD-10-CM

## 2012-02-26 DIAGNOSIS — I10 Essential (primary) hypertension: Secondary | ICD-10-CM

## 2012-02-26 DIAGNOSIS — E118 Type 2 diabetes mellitus with unspecified complications: Secondary | ICD-10-CM

## 2012-02-26 DIAGNOSIS — G8929 Other chronic pain: Secondary | ICD-10-CM

## 2012-02-26 DIAGNOSIS — M542 Cervicalgia: Secondary | ICD-10-CM

## 2012-02-26 DIAGNOSIS — Z23 Encounter for immunization: Secondary | ICD-10-CM

## 2012-02-26 LAB — POCT GLYCOSYLATED HEMOGLOBIN (HGB A1C): Hemoglobin A1C: 8.6

## 2012-02-26 MED ORDER — MELOXICAM 7.5 MG PO TABS: 7.5000 mg | ORAL_TABLET | Freq: Every day | ORAL | Status: DC

## 2012-02-26 NOTE — Patient Instructions (Signed)
Thank you for coming in, today! It was nice to meet you. For your diabetes -   Keep taking 60 units of your insulin in the morning. Start taking 30 units at night.  Start checking your blood sugar at least once per day and write it down. Bring in your records next time you see me.  Make an appointment to see me in about 1-2 months. For your blood pressure -  Make an appointment to see Dr. Valentina Lucks for "ambulatory blood pressure monitoring."  I don't plan to change any of your medications, right now. For your low testosterone -   Take your last shot when it is time (14 days after the last one you took).  We will check your testosterone level today and talk about it, the next time you see me.  Controlling your diabetes and blood pressure will help. For your neck pain -  I think you strained a muscle at work.  Rest your shoulder as much as possible. You can use ice (15 minutes on, 15 minutes off) throughout the day as you need.  I will write a prescription for a medicine called meloxicam (Mobic) for 5 days that should help with the inflammation. Please make an appointment with me in 1-2 months. Please make an appointment with Dr. Valentina Lucks for blood pressure as soon as it is convenient. --Dr. Venetia Maxon

## 2012-02-26 NOTE — Assessment & Plan Note (Signed)
A: Pt with multiple high blood pressure readings over the past few years, on multiple medications. States he is compliant with all meds and "has had lots of trouble with blood pressure in the past." P: Will plan for pt to schedule visit with Dr. Valentina Lucks for ambulatory blood pressure monitoring, and attempt to adjust medications at follow-up.

## 2012-02-26 NOTE — Assessment & Plan Note (Addendum)
A: Pt continues to get testosterone cypronate 200 mcg q14 days injection. Reports some improvement of symptoms. P: Will continue injections (pt believes he has enough medication for one more injection). Will check a free testosterone level. Given age and other comorbidities, will likely attempt to optimize control of DM and HTN rather than restarting testosterone injections once Rx is up.

## 2012-02-26 NOTE — Progress Notes (Signed)
  Subjective:    Patient ID: Anthony [name], male    DOB: Apr 25, 1951, 61 y.o.   MRN: [id number]  HPI: Pt presents for follow-up for several chronic conditions. Also reports acute worse neck pain for 4-5 days.  Neck pain - Pt with some chronic neck/back pain due to a previous back injury (sees "pain doctor" who writes oxycodone Rx). Present acutely worse on the right side for 4-5 days. Described as sharp in neck "if I turn my head a certain way," worse in the morning. Throughout the day at work pain "moves down" into right shoulder and upper chest/ribs (pt works for [employer], climbing in and out of trucks, lifting trailers, closing overhead doors, etc). Some relief with Tylenol Arthritis.  DM - Pt last seen in June; at that time, was not due for A1c and could not remember CBG trend and did not have logs. States today "I don't check my sugar like I should," and does not have his meter or CBG log. Currently taking metformin 1000 mg BID and insulin 70/30, 60 units in the morning, 20 units at night. Pleased to hear A1c today is 8.6, down from 10.5 in April. States he is improving his diet and trying to exercise more.  HTN - Pt is on several medications: amlodipine 10 mg, metoprolol 100 BID, Cozaar 100 mg daily. Also takes furosemide 40 mg and Imdur 60 mg daily. BP today 145/85, trend at past visits 140's-170's/70's-90's. Pt states, "I've been fighting that for years, my normal is higher than what the book says. They've tried me on all kinds of meds." Denies any chest pain, change in vision, shortness of breath, headaches.  Low testosterone - Pt had a low free testosterone late last year with some complaints of ED, loss of libido, and fatigue. Started testosterone cypronate 200 mcg q14 days in June per Dr. Georgina Snell. States he believes he has "one more injection left" and reports some improvement of symptoms. Denies pain with urination, incomplete voiding, or dribbling.  Review of Systems: As above.       Objective:   Physical Exam BP 145/85  Pulse 87  Temp 98.2 F (36.8 C) (Oral)  Ht 6\' 1"  (1.854 m)  Wt 211 lb 6.4 oz (95.89 kg)  BMI 27.89 kg/m2 Gen: elderly man, in NAD, pleasant and cooperative with interview/exam Cardio: RRR, no rub/murmur Pulm: CTAB, no wheezes MSK: neck and right arm supple with full ROM though pt moves slowly with some anticipation of pain  no tenderness to palpation or obvious bony abnormality in neck or right shoulder  Passive ROM of the right shoulder/arm with some pain at extremes of abduction and extension Ext: distal pulses 2+ and intact/symmetric     Assessment & Plan:

## 2012-02-26 NOTE — Assessment & Plan Note (Signed)
A: Minor acute pain worse for 4-5 days without significant limitation in ROM. Some relief with Tylenol Arthritis. Good ROM without bony abnormality or weakness. P: Rx given for meloxicam 7.5 mg daily for 5-10 days. Will consider imaging if no improvement by next visit.

## 2012-02-26 NOTE — Assessment & Plan Note (Addendum)
A: A1c 8.6 today, not well-controlled but improved from previous months. Not checking CBGs regularly at home. Taking metformin 1000 mg BID and insulin 70/30 60 units qAM, 20 units qPM. No reported hypoglycemic episodes. P: Continue diet/exercise improvements. Will increase insulin dose to 60 units qAM and 30 units qPM, Pt instructed to check CBGs at least once per day and to bring log at follow-up in 1-2 months.

## 2012-02-27 LAB — TESTOSTERONE, FREE, TOTAL, SHBG
Sex Hormone Binding: 46 nmol/L (ref 13–71)
Testosterone, Free: 74.6 pg/mL (ref 47.0–244.0)
Testosterone-% Free: 1.7 % (ref 1.6–2.9)
Testosterone: 448.01 ng/dL (ref 300–890)

## 2012-03-05 ENCOUNTER — Encounter: Payer: Self-pay | Admitting: Pharmacist

## 2012-03-05 ENCOUNTER — Ambulatory Visit (INDEPENDENT_AMBULATORY_CARE_PROVIDER_SITE_OTHER): Payer: Self-pay | Admitting: Pharmacist

## 2012-03-05 VITALS — BP 138/79 | HR 58 | Ht 72.25 in | Wt 209.4 lb

## 2012-03-05 DIAGNOSIS — I10 Essential (primary) hypertension: Secondary | ICD-10-CM

## 2012-03-05 NOTE — Progress Notes (Signed)
  Subjective:    Patient ID: Anthony [name], male    DOB: 1950-07-03, 60 y.o.   MRN: [id number]  HPI Patient arrives in good spirits today to have a 24-hour blood pressure meter placed.  His BP today was 138/79 with a HR of 58 (first readng) and 144/77 on a second reading on hypertensive medications.   Review of Systems     Objective:   Physical Exam Last 3 Office BP readings: 145/85 mmHg 147/87 mmHg 147/82 mmHg  Today's Office BP reading: 138/79 mmHg and 144/77 mmHg (automated reading)  ABPM Study Data: Arm Placement left arm   Overall Mean 24hr BP:   147/82 mmHg HR: 58  Daytime Mean BP:  148/83 mmHg HR: 58   Nighttime Mean BP:  136/72 mmHg HR: 60  Dipping Pattern: no  Sys:    8.0%   Dia: 13.7%    [normal dipping ~10-20%]   Non-hypertensive ABPM thresholds: daytime BP <135/85 mmHg, sleeptime BP <120/75 mmHg NICE Hypertension Guidelines ([place]) using ABPM: Stage I: >135/85 mmHg, Stage 2: >150/95 mmHg)   BMET    Component Value Date/Time   NA 137 11/06/2011 1434   K 4.0 11/06/2011 1434   CL 103 11/06/2011 1434   CO2 25 11/06/2011 1434   GLUCOSE 283* 11/06/2011 1434   BUN 13 11/06/2011 1434   CREATININE 1.03 11/06/2011 1434   CREATININE 1.06 09/18/2010 0545   CALCIUM 9.2 11/06/2011 1434   GFRNONAA >60 09/18/2010 0545   GFRAA  Value: >60        The eGFR has been calculated using the MDRD equation. This calculation has not been validated in all clinical situations. eGFR's persistently <60 mL/min signify possible Chronic Kidney Disease. 09/18/2010 0545   Antihypertensive Medications Amlodipiine 10mg  PO Qday Furosemide 40mg  PO Qday Isosorbide mononitrate 60mg  PO Qday Losartan 100mg  PO Qday Metoprolol 100mg  PO BID     Assessment & Plan:   Anthony [name] completed a 24-hour ambulatory blood pressure study with results that appear to be consistent with isolated systolic hypertension related to aging, with an average blood pressure of 147/82 mmHg, with a nocturnal dipping pattern  that is abnormal  with Sytolic only 8% reduced.   Changes to medications move amlodipine at current dose to evening dosing to improve dipping pattern AND switching metoprolol to carvedilol 25mg  BID. Written patient instructions provided. Follow-up with Dr. Venetia Maxon when we will consider switch from furosemide to a thiazide of choice for added potential benefit in Isolated systolic HTN. When you finish your metoprolol medication, start taking carvedilol 25 mg twice daily. Pres[facility] on [address]. Please schedule and follow up with Dr. Venetia Maxon next month. Patient seen with Anthony [name], PharmD Candidate.

## 2012-03-06 MED ORDER — CARVEDILOL 25 MG PO TABS: 25.0000 mg | ORAL_TABLET | Freq: Two times a day (BID) | ORAL | Status: DC

## 2012-03-06 MED ORDER — AMLODIPINE BESYLATE 10 MG PO TABS: 10.0000 mg | ORAL_TABLET | Freq: Every day | ORAL | Status: DC

## 2012-03-06 NOTE — Assessment & Plan Note (Signed)
  Anthony [name] completed a 24-hour ambulatory blood pressure study with results that appear to be consistent with isolated systolic hypertension related to aging, with an average blood pressure of 147/82 mmHg, with a nocturnal dipping pattern that is abnormal  with Sytolic only 8% reduced.   Changes to medications move amlodipine at current dose to evening dosing to improve dipping pattern AND switching metoprolol to carvedilol 25mg  BID. Written patient instructions provided. Follow-up with Dr. Venetia Maxon when we will consider switch from furosemide to a thiazide of choice for added potential benefit in Isolated systolic HTN. When you finish your metoprolol medication, start taking carvedilol 25 mg twice daily. [facility] [address]. Please schedule and follow up with Dr. Venetia Maxon next month. Patient seen with Wenda Low, PharmD Candidate.

## 2012-03-06 NOTE — Patient Instructions (Addendum)
Thank you for coming in today and allowing [place] to see how your blood pressure was doing over the last 24 hours. Please start taking your amlodipine 10 mg at night. When you finish your metoprolol medication, start taking carvedilol 25 mg twice daily. [facility] to [facility] [address]. Please schedule and follow up with Dr. Venetia Maxon next month.

## 2012-03-09 NOTE — Progress Notes (Signed)
Patient ID: Anthony [name], male   DOB: Aug 03, 1950, 61 y.o.   MRN: [id number] Reviewed and agree with Dr. Graylin Shiver documentation and management.

## 2012-03-13 ENCOUNTER — Telehealth: Payer: Self-pay | Admitting: Pharmacist

## 2012-03-13 ENCOUNTER — Other Ambulatory Visit: Payer: Self-pay | Admitting: *Deleted

## 2012-03-13 MED ORDER — SIMVASTATIN 20 MG PO TABS: 20.0000 mg | ORAL_TABLET | Freq: Every day | ORAL | Status: DC

## 2012-03-13 NOTE — Telephone Encounter (Signed)
Patient called questioning cost of carvedilol.   Clarified cost with [facility] [address] and communicated with patient  $11.99 for 90 day supply.  Patient will make switch to carvedilol after pick up.

## 2012-04-06 ENCOUNTER — Encounter: Payer: Self-pay | Admitting: Home Health Services

## 2012-04-08 ENCOUNTER — Encounter: Payer: Self-pay | Admitting: Home Health Services

## 2012-04-10 ENCOUNTER — Encounter: Payer: Self-pay | Admitting: Family Medicine

## 2012-04-10 ENCOUNTER — Ambulatory Visit (INDEPENDENT_AMBULATORY_CARE_PROVIDER_SITE_OTHER): Payer: Self-pay | Admitting: Family Medicine

## 2012-04-10 VITALS — BP 132/82 | HR 68 | Ht 72.25 in | Wt 208.0 lb

## 2012-04-10 DIAGNOSIS — I10 Essential (primary) hypertension: Secondary | ICD-10-CM

## 2012-04-10 DIAGNOSIS — E1165 Type 2 diabetes mellitus with hyperglycemia: Secondary | ICD-10-CM

## 2012-04-10 DIAGNOSIS — M542 Cervicalgia: Secondary | ICD-10-CM

## 2012-04-10 DIAGNOSIS — E291 Testicular hypofunction: Secondary | ICD-10-CM

## 2012-04-10 DIAGNOSIS — IMO0002 Reserved for concepts with insufficient information to code with codable children: Secondary | ICD-10-CM

## 2012-04-10 DIAGNOSIS — R7989 Other specified abnormal findings of blood chemistry: Secondary | ICD-10-CM

## 2012-04-10 DIAGNOSIS — E7800 Pure hypercholesterolemia, unspecified: Secondary | ICD-10-CM

## 2012-04-10 DIAGNOSIS — G8929 Other chronic pain: Secondary | ICD-10-CM

## 2012-04-10 DIAGNOSIS — IMO0001 Reserved for inherently not codable concepts without codable children: Secondary | ICD-10-CM

## 2012-04-10 NOTE — Assessment & Plan Note (Signed)
A: Continues on Zocor, last LDL in June at goal. P: Continue Zocor, will likely check CMP and lipid panel at next visit and adjust dosage/agent as appropriate.

## 2012-04-10 NOTE — Assessment & Plan Note (Signed)
A: Improved from last visit, "comes and goes," but good relief with short course of meloxicam. P: No need for imaging at this point, will monitor routinely and consider meloxicam again if pain returns. Pt otherwise sees a pain doctor for narcotics for chronic pain.

## 2012-04-10 NOTE — Assessment & Plan Note (Signed)
A: Pt has completed course of injections and reports relief of symptoms along with improvement of other medical conditions (see HTN, HLD, DM). Testosterone levels within normal limits at last check in October 2013. P: No further plans for supplementation; will follow up subjective symptoms and optimize control of DM and HTN to help with energy level, libido, etc.

## 2012-04-10 NOTE — Assessment & Plan Note (Signed)
A: Pt recently saw Dr. Valentina Lucks for ambulatory monitoring, switched from metoprolol to carvedilol. Has been taking Coreg 25 mg once a day due to "feeling bad" attributed to the new medication. P: Will continue Coreg 25 mg BID for now along with other agents (amlodipine, Imdur, Cozaar); instructed pt to call if it continues to "make him feel poorly," and will consider reducing dosage from 25 mg BID to 12.5 mg BID. Will also consider switching from Lasix to thiazide, depending on further response to/tolerance of medications.

## 2012-04-10 NOTE — Patient Instructions (Addendum)
Thanks for coming in, today! It was good to see you. I'm glad your blood pressure is under better control.  Keep up with your medications. Take the new one (Coreg, also called carvedilol) twice per day. If it makes you feel bad, call the clinic, and we'll see if we can adjust it.  Keep up the good work with stopping smoking! It will make a big difference if you don't start again. For your diabetes:  Take your insulin, 60 units in the morning, 30 units at night.  Check your sugar once a day at least. Write down the results and bring them back to show me how you're doing. Your testosterone levels were normal, so I think we're done with the shots. We'll try to get your blood pressure and diabetes under good control, too. Come back to see me in about 1 or 2 months. Feel free to call or come back earlier if you need to. --Dr. Venetia Maxon

## 2012-04-10 NOTE — Progress Notes (Signed)
  Subjective:    Patient ID: Anthony [name], male    DOB: 1951/02/25, 61 y.o.   MRN: [id number]  HPI: Pt presents for follow up of HTN, DM, HLD, and low testosterone. Also seen recently for acute neck pain.  HTN: Pt recently seen by Dr. Valentina Lucks for ambulatory blood pressure monitoring and switched from metoprolol to Coreg BID. States that he started Coreg BID but "felt really poorly" taking it twice per day, so he has only been taking it once per day; he thought maybe he started the new medicine too quickly and that the "old medicine [metoprolol] was still in his system," and states he plans to try taking Coreg BID again as directed to see if he'll be able to tolerate it. Pt is however glad to see his BP has been better. Reports compliance with amlodipine, Imdur, and losartan otherwise, but will need refills soon.  DM: Reports compliance with metformin, though states he was confused about what his insulin dose is supposed to be (last visit was increased from insulin 70/30 60 units qAM and 20 units qPM to 60 qAM and 30 qPM). Also states he "hasn't been checking his sugar like he should," and does not bring his CBG log with him. Still glad to know his A1c last time was better than before and states he would like his goal to be "6 and a half," but does not want to get hypoglycemic, and agrees that "less than 8" would be a good goal. He also states he is out of ASA and needs to get some more.  HLD: Still taking Zocor, no new muscle aches/or pains. No jaundice or RUQ pain, no N/V.  Low testosterone: Pt has completed injections and reports improved energy level and libido, though he attributes this some to improved BP and DM control as well as stopping smoking. Glad to hear his testosterone levels were normal, recently. No complaints of dribbling, urinary frequency/hesitancy, or dysuria.  Neck pain: Resolved after 5-day course of meloxicam. Does state that "it comes and goes," but states meloxicam helped a lot.  Still sees a pain doctor for narcotics for chronic pain.  Pt reports he stopped smoking 1 month ago, but states "it's not been easy." He attributes a lot of his improvement in mood/energy and blood pressure to stopping smoking.  Review of Systems: As above.      Objective:   Physical Exam BP 132/82  Pulse 68  Ht 6' 0.25" (1.835 m)  Wt 208 lb (94.348 kg)  BMI 28.01 kg/m2 Gen: elderly male, very pleasant and in NAD Card: RRR, no rub/murmur; distal pulses 2+ and symmetric Pulm: CTAB, no wheezes Abd; soft, nontender, BS+ MSK: neck supple, nontender, good ROM of both shoulders Ext: warm, well-perfused, skin warm/dry/intact     Assessment & Plan:

## 2012-04-10 NOTE — Assessment & Plan Note (Signed)
A: Last A1c improved from previous. Still not checking CBG's regularly. Taking metformin BID and insulin, though expresses some confusion over what dose to take. P: Continue diet/exercise modification. Reiterated dose of insulin 70/30 to be 60 units qAM and 30 units qPM. Also reinforced checking CBG's and bringing log to next follow-up.

## 2012-04-19 ENCOUNTER — Other Ambulatory Visit: Payer: Self-pay | Admitting: Family Medicine

## 2012-05-04 ENCOUNTER — Ambulatory Visit (INDEPENDENT_AMBULATORY_CARE_PROVIDER_SITE_OTHER): Payer: Self-pay | Admitting: Family Medicine

## 2012-05-04 ENCOUNTER — Encounter: Payer: Self-pay | Admitting: Family Medicine

## 2012-05-04 VITALS — BP 137/67 | HR 52 | Temp 97.9°F | Ht 73.0 in | Wt 210.3 lb

## 2012-05-04 DIAGNOSIS — L989 Disorder of the skin and subcutaneous tissue, unspecified: Secondary | ICD-10-CM

## 2012-05-04 NOTE — Progress Notes (Signed)
Patient ID: Anthony [name], male   DOB: 1951/01/24, 61 y.o.   MRN: [id number] Subjective: The patient is a 61 y.o. year old male who presents today for knot on neck.  Initially presented about 6 days ago and has been getting larger.  Is somewhat painful to palpation.  No fevers/chills.  No difficulty swallowing.  Does not remember cutting himself while shaving.  No other trauma  Patient's past medical, social, and family history were reviewed and updated as appropriate. History  Substance Use Topics  . Smoking status: Former Smoker -- 0.3 packs/day    Types: Cigarettes    Start date: 05/28/1967    Quit date: 03/04/2010  . Smokeless tobacco: Never Used  . Alcohol Use: No   Objective:  Filed Vitals:   05/04/12 0927  BP: 137/67  Pulse: 52  Temp: 97.9 F (36.6 C)   Gen: NAD HEENT: 32mm raised lesion in the subcutaneous tissue of the left lower neck.  There  Is no surrounding erythema, there are no surrounding lymph nodes, thyroid is not enlarged.  Patient has poor dentition but does not have any tenderness in the jaw  Assessment/Plan: Skin lesion, likely related to minor trauma.  No evidence of infection at this time.  Will plan watchful waiting with RTC in 1-2 weeks if not better.  Discussed options of removal (eliptical excision) vs freezing but for now feel both are somewhat more extreme that is warranted.  Please also see individual problems in problem list for problem-specific plans.

## 2012-05-14 ENCOUNTER — Other Ambulatory Visit: Payer: Self-pay | Admitting: *Deleted

## 2012-05-14 MED ORDER — METFORMIN HCL 1000 MG PO TABS: 1000.0000 mg | ORAL_TABLET | Freq: Two times a day (BID) | ORAL | Status: DC

## 2012-05-17 ENCOUNTER — Other Ambulatory Visit: Payer: Self-pay | Admitting: Family Medicine

## 2012-05-18 ENCOUNTER — Other Ambulatory Visit: Payer: Self-pay | Admitting: *Deleted

## 2012-05-18 MED ORDER — FUROSEMIDE 40 MG PO TABS: 40.0000 mg | ORAL_TABLET | Freq: Every day | ORAL | Status: DC

## 2012-07-02 ENCOUNTER — Encounter: Payer: Self-pay | Admitting: Family Medicine

## 2012-07-02 ENCOUNTER — Ambulatory Visit (INDEPENDENT_AMBULATORY_CARE_PROVIDER_SITE_OTHER): Payer: BC Managed Care – PPO | Admitting: Family Medicine

## 2012-07-02 VITALS — BP 133/77 | HR 65 | Ht 73.0 in | Wt 201.0 lb

## 2012-07-02 DIAGNOSIS — S80822A Blister (nonthermal), left lower leg, initial encounter: Secondary | ICD-10-CM | POA: Insufficient documentation

## 2012-07-02 DIAGNOSIS — IMO0002 Reserved for concepts with insufficient information to code with codable children: Secondary | ICD-10-CM

## 2012-07-02 DIAGNOSIS — E1165 Type 2 diabetes mellitus with hyperglycemia: Secondary | ICD-10-CM

## 2012-07-02 DIAGNOSIS — Z23 Encounter for immunization: Secondary | ICD-10-CM

## 2012-07-02 DIAGNOSIS — IMO0001 Reserved for inherently not codable concepts without codable children: Secondary | ICD-10-CM

## 2012-07-02 LAB — POCT GLYCOSYLATED HEMOGLOBIN (HGB A1C): Hemoglobin A1C: >14

## 2012-07-02 MED ORDER — BACITRACIN-NEOMYCIN-POLYMYXIN 400-5-5000 EX OINT: TOPICAL_OINTMENT | Freq: Two times a day (BID) | CUTANEOUS | Status: DC

## 2012-07-02 NOTE — Progress Notes (Signed)
  Subjective:    Patient ID: [name], male    DOB: 04/12/1951, 62 y.o.   MRN: [id number]  HPI: Pt presents for follow-up of DM and new complaint of a "bump" on his left leg.  DM: Pt comes in for A1c check. Reports compliance with insulin 70/30 (taking 60 units in the morning and 10 units in the evening; previously taking 60 and 20, but cut back to 10 at night for "feeling poorly" in the morning that he attributes to "likely low blood sugar") and metformin 1000 mg BID. Pt has not regularly been checking his CBG's; his meter battery died about 3 weeks ago. His last check was about 1 month ago, around 150. Otherwise he reports sugars around 150's-180's. He denies hyperglycemic symptoms, and has had only one episode that he thinks may have been due to low blood sugar; about three weeks ago, he had one day at work when he "delayed" eating longer than normal and began to feel tired, nervous/"jittery," and queasy; his symptoms improved with eating and resting for about 10 minutes.  Leg bump: Pt states Wednesday morning (yesterday) he noticed a very small bump on his left upper thigh that he initially thought was an ingrown hair. The bump was not painful but was very pruritic. The bump continued to get bigger and itched throughout the day. Pt put some cortisone cream on it before bed, and noticed this morning that it was even bigger and still itching. Around 2 PM today, the lesion burst and had some slight clear fluid drainage. He has had no fevers/chills, N/V/D. Denies pain in the area, as well. The bump itself has/had some redness to it but he has had no spreading of redness or warmth/tenderness around the bump, before it burst or after. He states he has never had lesions like this before.  Review of Systems: As above.     Objective:   Physical Exam BP 133/77  Pulse 65  Ht 6\' 1"  (1.854 m)  Wt 201 lb (91.173 kg)  BMI 26.52 kg/m2 Gen: well-appearing elderly male, in NAD, pleasant and  cooperative HEENT: [place]/AT, EOMI, sclerae/conjunctivae clear Cardio: RRR, no murmur appreciated Pulm: CTAB, no wheezes, normal WOB Abd: soft, nontender, nondistended Ext: moves all extremities equally/spontaneously Skin: ~1cm round lesion to upper left thigh, burst blister-like appearance with roof intact, deflated over yellowish ulcerated base  Thin red ring surrounding lesion but no frank induration or fluctuance; lesion nontender with no increased warmth to the touch  Minimal thin reddish serous drainage from lesion after removal of Band-aid but no continued drainage or purulence     Assessment & Plan:

## 2012-07-02 NOTE — Assessment & Plan Note (Signed)
A: Uncertain trigger for leg blister, but it does not look frankly infected and does not appear to have any surrounding cellulitis. No systemic symptoms.  P: Rx for antibiotic ointment and precautions for need to return to clinic or present to ED discussed. Will re-evaluate at follow up; if worsening or development of systemic symptoms, would favor short course of doxycycline.

## 2012-07-02 NOTE — Assessment & Plan Note (Signed)
A: A1c today >14. Still not checking CBG's regularly. Reports compliance with metformin and insulin, though does report he reduced his PM dose of insulin to 10 units down from 20; previous A&P note indicate pt had been on 60 qAM and 30 qPM.  P: Will need to re-address insulin dosages. May consider increasing metformin. Continued to encourage checking CBG's and bringing in a log book. Likely will request pt to follow up with Dr. Valentina Lucks.

## 2012-07-02 NOTE — Patient Instructions (Signed)
Thanks for coming in, today! It was good to see you. We'll check your A1c today. If it's high, we might adjust your medicine or insulin.    Get your meter a new battery! Check your sugar at least once per day and write it down in a log.    Try checking your sugar before you go to bed. Bring in your logs next time you see me. For the bump on your leg, I'll write a prescription for an antibiotic cream.    If the place keeps getting worse or doesn't look like it's healing over the next few days, call or come back.    If you start running fevers or having nausea/vomiting that won't go away, call the clinic for an appointment or go to the emergency room. Bring or send me any forms you need for social security and I'll fill it out for you. Call or come back to the clinic sooner at any time, if you need.

## 2012-07-03 ENCOUNTER — Telehealth: Payer: Self-pay | Admitting: Family Medicine

## 2012-07-03 NOTE — Telephone Encounter (Signed)
Has not drained any more since yesterday.  Looks the same from yesterday except that it's bigger.  Neosporin not helping.  Will check with Dr. Venetia Maxon and call patient back.  Nolene Ebbs, RN

## 2012-07-03 NOTE — Telephone Encounter (Signed)
Discussed with Tomasa Hosteller. Pt's leg lesion did not appear to be cellulitic yesterday, but if it is enlarging and the redness is "getting bigger," it's probably worth being evaluated again, either at our clinic as a SDA or at an Urgent Care. As I documented in my note yesterday, if the lesion does appear to be early/progressing cellulitis, I think pt could be started on a course of doxycycline PO. Also of note, pt's A1c resulted yesterday >14, and this will need follow-up with me or someone, anyway. Pt should schedule an appointment with me in the next couple of weeks or so to follow up diabetes, regardless of how his leg lesion is managed. --CMS

## 2012-07-03 NOTE — Telephone Encounter (Signed)
Pt is calling to say that he thinks he needs something more that the neosporin for his leg - the redness is continuing to get bigger and yesterday was the size of a dime and today is the size of a quarter. pls advise

## 2012-07-03 NOTE — Telephone Encounter (Signed)
Returned call to patient.  Informed of message from Dr. Venetia Maxon.  Patient unable to come in today for office visit.  Patient informed he can go to urgent care or ED.  Patient states he will continue with Neosporin a little longer and will go to urgent care or ED if size or redness worsens.  Scheduled a follow-up appt with Dr. Venetia Maxon for 07/16/12 at 2:30 pm.  Nolene Ebbs, RN

## 2012-07-16 ENCOUNTER — Encounter: Payer: Self-pay | Admitting: Family Medicine

## 2012-07-16 ENCOUNTER — Ambulatory Visit (INDEPENDENT_AMBULATORY_CARE_PROVIDER_SITE_OTHER): Payer: BC Managed Care – PPO | Admitting: Family Medicine

## 2012-07-16 VITALS — BP 124/68 | HR 77 | Ht 73.0 in | Wt 209.0 lb

## 2012-07-16 DIAGNOSIS — E1165 Type 2 diabetes mellitus with hyperglycemia: Secondary | ICD-10-CM

## 2012-07-16 DIAGNOSIS — S80822A Blister (nonthermal), left lower leg, initial encounter: Secondary | ICD-10-CM

## 2012-07-16 DIAGNOSIS — IMO0002 Reserved for concepts with insufficient information to code with codable children: Secondary | ICD-10-CM

## 2012-07-16 DIAGNOSIS — E7800 Pure hypercholesterolemia, unspecified: Secondary | ICD-10-CM

## 2012-07-16 DIAGNOSIS — I10 Essential (primary) hypertension: Secondary | ICD-10-CM

## 2012-07-16 DIAGNOSIS — IMO0001 Reserved for inherently not codable concepts without codable children: Secondary | ICD-10-CM

## 2012-07-16 MED ORDER — POTASSIUM CHLORIDE CRYS ER 10 MEQ PO TBCR: EXTENDED_RELEASE_TABLET | ORAL | Status: DC

## 2012-07-16 MED ORDER — METFORMIN HCL 1000 MG PO TABS: 1000.0000 mg | ORAL_TABLET | Freq: Two times a day (BID) | ORAL | Status: DC

## 2012-07-16 MED ORDER — LOSARTAN POTASSIUM 100 MG PO TABS: 100.0000 mg | ORAL_TABLET | Freq: Every day | ORAL | Status: DC

## 2012-07-16 NOTE — Assessment & Plan Note (Signed)
Compliant with Zocor. Continue for now. Consider CMP and lipid panel to assess kidney/liver function and to see if there is any need to increase to higher-potency statin.

## 2012-07-16 NOTE — Assessment & Plan Note (Signed)
A: Appear to be healing slowly. No evidence for surrounding cellulitis. No systemic symptoms. Still uncertain cause; no reported hx of trauma/injury, no hx of pemphigus vulgaris or bullous pemphigoid, etc.  P: Continue antibiotic ointment, daily cleaning/dressing, etc. Pt instructed to return sooner than planned f/u if either worsening or failure to continue healing. If persistent, may need to re-evaluate treatment and or consider further work-up.

## 2012-07-16 NOTE — Progress Notes (Signed)
  Subjective:    Patient ID: Anthony [name], male    DOB: [birth year], 62 y.o.   MRN: [id number]  HPI: Pt presents for general f/u, especially of DM and ulcer to left thigh.  DM: Pt's last A1c >14 two weeks ago; prior to that had been between 8-10 (last 8.6). Pt very discouraged to hear that result and admits that previously he had not been taking insulin 70/30 every day, trying to "stretch" it. Since last visit, pt has been taking insulin 70/30 daily, 70 units in the morning, along with metformin; pt reports he takes insulin at 0500, then metformin at 1300, then metformin at 2100. Has been checking CBG's at home but not daily and pt did not record/bring in a log; reports readings of 125 on 2/15 around 0800, and 137 today at 0500. Otherwise denies "feeling poorly" in the mornings or other hypoglycemic episodes. Pt does request a thorough foot exam, today.  Leg lesion: Blister noted at previous visit about 2 weeks ago. Pt reports it is healing and itching much less than previously, with less redness around it. Pt has been using antibiotic ointment at least BID since last visit. No systemic symptoms.  Otherwise has no complaints. Complaint with meds for HTN and hyperlipidemia; reports no side effects, no new myalgias, headaches, or change in vision. Pt does state he ran out of Losartan recently and did not request a refill because his blood pressures "have been good" and he did not think he needed to be on it; stated after counseling that he agreed it would be a good idea to keep taking it it "if it helps with other things" (particularly discussed kidney protection in setting of DM). Refills requested for K-Dur, metformin, and Losartan.  Review of Systems: As above. Otherwise denies N/V, fevers, chest pain, SOB, dizziness/lightheadedness     Objective:   Physical Exam BP 124/68  Pulse 77  Ht 6' 1"$  (1.854 m)  Wt 209 lb (94.802 kg)  BMI 27.58 kg/m2 Gen: Adult elderly male, pleasant/cooperative, in  NAD Cardio: RRR, no murmur appreciated Pulm: CTAB, no wheezes Abd: Soft, nontender, BS+ Ext: no cyanosis or LE edema; minimal swelling to left ankle Skin: healing ulcer to left thigh, ~2cm in diameter and circular; noted on previous outpt exam ~2 weeks ago  Improved appearance with less surrounding erythema, no induration  Central thick eschar, nontender Diabetic foot exam performed, documented in EPIC.     Assessment & Plan:

## 2012-07-16 NOTE — Assessment & Plan Note (Addendum)
A: Pt reports checking CBG's "a few times here and there" at home but did not bring a log. Reports a slightly unusual schedule of medications at home, but pt reports he can tolerate it as far as symptoms go (no hypoglycemia/"feeling poorly" in the mornings, etc).  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  P: Continue insulin 70/30 with 70 units in the AM. Continue metformin BID. Reiterated checking CBG's; instructed pt to check twice a day, daily, and to keep a log to bring in (this is at least the third visit this was discussed). Will reassess medications/control at follow-up and will recheck A1c when appropriate to assess long-term control. Pt counseled on daily foot care and hygiene. F/u in about 1 month. Will also consider referral to Dr. Valentina Lucks, especially given history of poor control and questionable compliance, at best.

## 2012-07-16 NOTE — Patient Instructions (Addendum)
PT PROVIDED WITH HAND-WRITTEN INSTRUCTIONS DUE TO COMPUTER DIFFICULTIES PRIOR TO HIS LEAVING: "Thank you for coming in today! For your diabetes:    Take your insulin in the morning, 70 units.    Take metformin twice per day.    Check your blood sugar twice per day.    Make sure to write your blood sugar down and bring the log in with you.    Check your feet daily! For the sore on your leg:    Keep the area clean.    Use the antibiotic ointment twice per day.    If it looks like it's not healing, we might need to treat it differently.     If it gets worse or if you start running fevers or having nausea/vomiting, come back. Call any time with any questions. Make an appointment to see me in about 1 month. I will call your prescriptions in when our computer comes back up."

## 2012-07-16 NOTE — Assessment & Plan Note (Addendum)
A: Reasonable control with current medications (out of Losartan for ~1-2 weeks).  P: Continue current meds. Refilled Losartan. Continue to consider switch from Lasix to HCTZ.

## 2012-08-04 ENCOUNTER — Ambulatory Visit (INDEPENDENT_AMBULATORY_CARE_PROVIDER_SITE_OTHER): Payer: BC Managed Care – PPO | Admitting: Family Medicine

## 2012-08-04 ENCOUNTER — Encounter: Payer: Self-pay | Admitting: Family Medicine

## 2012-08-04 VITALS — BP 148/75 | HR 60 | Temp 98.9°F | Ht 73.0 in | Wt 215.0 lb

## 2012-08-04 DIAGNOSIS — K089 Disorder of teeth and supporting structures, unspecified: Secondary | ICD-10-CM

## 2012-08-04 DIAGNOSIS — S025XXA Fracture of tooth (traumatic), initial encounter for closed fracture: Secondary | ICD-10-CM | POA: Insufficient documentation

## 2012-08-04 DIAGNOSIS — K0889 Other specified disorders of teeth and supporting structures: Secondary | ICD-10-CM | POA: Insufficient documentation

## 2012-08-04 MED ORDER — PENICILLIN V POTASSIUM 500 MG PO TABS: 500.0000 mg | ORAL_TABLET | Freq: Four times a day (QID) | ORAL | Status: DC

## 2012-08-04 NOTE — Assessment & Plan Note (Signed)
A: Very poor dentition overall. Recent broken tooth on lower left with black stump remaining in place. Mild swelling of surrounding gum but no frank drainage, bleeding, or obvious abscess.  P: Rx provided for Pen VK four times daily for 10 days. Discussed with pt that he needs to be seen by a dentist as soon as possible; pt appears to have dental coverage. Instructed pt to contact insurance provider and previous dentist, or another dentist of his choice to be seen preferably within the week.

## 2012-08-04 NOTE — Progress Notes (Signed)
  Subjective:    Patient ID: Anthony [name], male    DOB: 1950-06-06, 62 y.o.   MRN: [id number]  HPI: Pt presents for SDA for tooth pain. Pt reports that he has had pain in a tooth on his lower left jaw for about 1 week, and about 5 days ago, the tooth broke off and the remaining stump has been very tender. Pain is worse with warm or cold food/liquid. Pain is described as a constant ache. Pt has had some swelling around the tooth and an "odd taste in his mouth," but denies frank pus/draining or bleeding. Pt reports he has been using biotin mouthwash, with some relief. He has also been taking Tylenol Arthritis and the pain medication he uses for his back, from his pain doctor, with reasonable relief. Pt reports he has not been seen by a dentist "in a long time," because he lost insurance at one point and was unable to be seen. Currently does have insurance and believes he has specific dental coverage.  Review of Systems: As above. Otherwise, no N/V, no change in bowel/bladder habits, no fevers/chills, no SOB or other pain.     Objective:   Physical Exam BP 148/75  Pulse 60  Temp(Src) 98.9 F (37.2 C) (Oral)  Ht 6\' 1"  (1.854 m)  Wt 215 lb (97.523 kg)  BMI 28.37 kg/m2 Gen: elderly male, in NAD, pleasant and cooperative HEENT: generally very poor dentition with several broken teeth/residual stumps of teeth with some tenderness when touched by tongue depressor  Currently active problem tooth on lower left, blackened stump remaining in socket with mild surrounding swelling of gum  No frank drainage or bleeding from tooth, no definite abscess of surrounding gums; surrounding gums mildly tender but less so than tooth itself     Assessment & Plan:

## 2012-08-04 NOTE — Patient Instructions (Signed)
Thank you for coming in, today. I'm sorry you're having trouble with your teeth. I am prescribing an antibiotic for your tooth.    I don't think there is an infection, but I want to prevent one.    Call your insurance company to see if you have dental insurance.    The dental clinic we refer people to is full for the month and only take patients with the "orange card" insurance.    Otherwise, contact the dentist you used before to be seen as soon as possible. Continue using Tylenol Arthritis for pain control. Call the clinic with any questions/concerns. --Dr. Venetia Maxon

## 2012-08-17 ENCOUNTER — Encounter: Payer: Self-pay | Admitting: *Deleted

## 2012-09-04 ENCOUNTER — Other Ambulatory Visit: Payer: Self-pay | Admitting: *Deleted

## 2012-09-04 DIAGNOSIS — I10 Essential (primary) hypertension: Secondary | ICD-10-CM

## 2012-09-04 MED ORDER — AMLODIPINE BESYLATE 10 MG PO TABS: 10.0000 mg | ORAL_TABLET | Freq: Every day | ORAL | Status: DC

## 2012-09-07 ENCOUNTER — Other Ambulatory Visit: Payer: Self-pay | Admitting: *Deleted

## 2012-09-07 MED ORDER — SIMVASTATIN 20 MG PO TABS: 20.0000 mg | ORAL_TABLET | Freq: Every day | ORAL | Status: DC

## 2012-09-14 ENCOUNTER — Encounter: Payer: Self-pay | Admitting: *Deleted

## 2012-10-21 ENCOUNTER — Other Ambulatory Visit: Payer: Self-pay | Admitting: *Deleted

## 2012-10-21 NOTE — Telephone Encounter (Signed)
Requested Prescriptions   Pending Prescriptions Disp Refills  . furosemide (LASIX) 40 MG tablet 90 tablet 2    Sig: Take 1 tablet (40 mg total) by mouth daily.   Pt would like for this med and all others to be ordered as 90 day supply. Thank you! Tildon Husky, RN-BSN

## 2012-10-22 MED ORDER — FUROSEMIDE 40 MG PO TABS: 40.0000 mg | ORAL_TABLET | Freq: Every day | ORAL | Status: DC

## 2012-11-19 ENCOUNTER — Ambulatory Visit (INDEPENDENT_AMBULATORY_CARE_PROVIDER_SITE_OTHER): Payer: BC Managed Care – PPO | Admitting: Family Medicine

## 2012-11-19 ENCOUNTER — Encounter: Payer: Self-pay | Admitting: Family Medicine

## 2012-11-19 VITALS — BP 131/63 | HR 74 | Temp 98.6°F | Ht 72.0 in | Wt 210.0 lb

## 2012-11-19 DIAGNOSIS — L02429 Furuncle of limb, unspecified: Secondary | ICD-10-CM

## 2012-11-19 DIAGNOSIS — L02439 Carbuncle of limb, unspecified: Secondary | ICD-10-CM

## 2012-11-19 MED ORDER — DOXYCYCLINE HYCLATE 100 MG PO TABS: 100.0000 mg | ORAL_TABLET | Freq: Two times a day (BID) | ORAL | Status: DC

## 2012-11-19 MED ORDER — TRAMADOL HCL 50 MG PO TABS: 50.0000 mg | ORAL_TABLET | Freq: Three times a day (TID) | ORAL | Status: DC | PRN

## 2012-11-19 NOTE — Patient Instructions (Addendum)
Mr. Sloan,  Thank you for coming in today.   For your boils please do the following: #1 doxycycline one pill twice daily for 10 days. He may take with a small amount of food. This pill can cause irritation to his throat so he must take it at least one hour before lying down. #2 take Tylenol as needed for pain and between the OxyContin. #3 look out for increased swelling and puffiness as this may be a sign that the boils need to be drained.  If he develops fever or high blood sugars please call and return to the clinic.  You are due for diabetes follow up with your PCP. Please schedule an appointment for this within the next one to 2 weeks.  Dr. Adrian Blackwater

## 2012-11-19 NOTE — Progress Notes (Signed)
Subjective:     Patient ID: Anthony [name], male   DOB: 1951/01/30, 62 y.o.   MRN: [id number]  HPI 62 year old smoker with history of uncontrolled diabetes and chronic pain presents for same-day visit to discuss the following  #1 boils under his left arm: Patient reported feeling 1 boil in his left arm pit 4 days ago. It was proceeded with itching. He then developed multiple boils in his left axilla. Associated with the pain. The pain is 8/10. There is no associated fever or chills. The patient takes OxyContin for chronic pain and states that his OxyContin is controlling most of his pain. The patient is compliant with his metformin and insulin. He did not check his blood sugar this morning.  Review of Systems As per history of present illness    Objective:   Physical Exam BP 131/63  Pulse 74  Temp(Src) 98.6 F (37 C) (Oral)  Ht 6' (1.829 m)  Wt 210 lb (95.255 kg)  BMI 28.47 kg/m2 General appearance: alert, cooperative and no distress Lungs: clear to auscultation bilaterally Heart: regular rate and rhythm, S1, S2 normal, no murmur, click, rub or gallop Extremities: patient with 5-6 rays boils under his left axilla. They are erythematous.  They are indurated with no fluctuance. There is no streaking of the arm. There is tender to palpation. There is no palpable supraclavicular lymphadenopathy.    Assessment and Plan:

## 2012-11-19 NOTE — Assessment & Plan Note (Signed)
A: Boils in a uncontrolled diabetic, smoker.  P: #1 doxycycline one pill twice daily for 10 days. He may take with a small amount of food. This pill can cause irritation to his throat so he must take it at least one hour before lying down. #2 take Tylenol as needed for pain and between the OxyContin. #3 look out for increased swelling and puffiness as this may be a sign that the boils need to be drained  PCP f/u in 1-2 weeks for diabetes management

## 2012-12-07 ENCOUNTER — Ambulatory Visit: Payer: BC Managed Care – PPO | Admitting: Family Medicine

## 2012-12-24 ENCOUNTER — Encounter: Payer: Self-pay | Admitting: Family Medicine

## 2012-12-24 ENCOUNTER — Ambulatory Visit (INDEPENDENT_AMBULATORY_CARE_PROVIDER_SITE_OTHER): Payer: BC Managed Care – PPO | Admitting: Family Medicine

## 2012-12-24 VITALS — BP 136/55 | HR 76 | Temp 99.0°F | Ht 73.0 in | Wt 203.0 lb

## 2012-12-24 DIAGNOSIS — G894 Chronic pain syndrome: Secondary | ICD-10-CM

## 2012-12-24 DIAGNOSIS — IMO0002 Reserved for concepts with insufficient information to code with codable children: Secondary | ICD-10-CM

## 2012-12-24 DIAGNOSIS — E1139 Type 2 diabetes mellitus with other diabetic ophthalmic complication: Secondary | ICD-10-CM

## 2012-12-24 DIAGNOSIS — IMO0001 Reserved for inherently not codable concepts without codable children: Secondary | ICD-10-CM

## 2012-12-24 DIAGNOSIS — Z0000 Encounter for general adult medical examination without abnormal findings: Secondary | ICD-10-CM

## 2012-12-24 DIAGNOSIS — E7800 Pure hypercholesterolemia, unspecified: Secondary | ICD-10-CM

## 2012-12-24 DIAGNOSIS — I10 Essential (primary) hypertension: Secondary | ICD-10-CM

## 2012-12-24 DIAGNOSIS — E1165 Type 2 diabetes mellitus with hyperglycemia: Secondary | ICD-10-CM

## 2012-12-24 LAB — COMPREHENSIVE METABOLIC PANEL
ALT: 19 U/L (ref 0–53)
AST: 13 U/L (ref 0–37)
Albumin: 3.7 g/dL (ref 3.5–5.2)
Alkaline Phosphatase: 79 U/L (ref 39–117)
BUN: 13 mg/dL (ref 6–23)
CO2: 26 mEq/L (ref 19–32)
Calcium: 9.1 mg/dL (ref 8.4–10.5)
Chloride: 102 mEq/L (ref 96–112)
Creat: 0.99 mg/dL (ref 0.50–1.35)
Glucose, Bld: 436 mg/dL — ABNORMAL HIGH (ref 70–99)
Potassium: 4.6 mEq/L (ref 3.5–5.3)
Sodium: 135 mEq/L (ref 135–145)
Total Bilirubin: 0.7 mg/dL (ref 0.3–1.2)
Total Protein: 5.9 g/dL — ABNORMAL LOW (ref 6.0–8.3)

## 2012-12-24 LAB — LIPID PANEL
Cholesterol: 106 mg/dL (ref 0–200)
HDL: 46 mg/dL (ref >39)
LDL Cholesterol: 50 mg/dL (ref 0–99)
Total CHOL/HDL Ratio: 2.3 Ratio
Triglycerides: 52 mg/dL (ref <150)
VLDL: 10 mg/dL (ref 0–40)

## 2012-12-24 LAB — POCT GLYCOSYLATED HEMOGLOBIN (HGB A1C): Hemoglobin A1C: 10

## 2012-12-24 NOTE — Patient Instructions (Addendum)
Thank you for coming in, today! Your A1c is 10, today, which is still high but better. Keep taking your medications as before without any changes. We will check your liver function, kidney function, and cholesterol, today. I will call you or send you a letter. I put in a referral to the GI doctor for a colonoscopy. Our office or theirs will call you with an appointment. Wash your feet every day. NEVER go barefoot. Check for any cuts or ulcers every day. Try using a pumice stone for your calluses. Your feet look okay, today. Come back to see me in about 3 months. Please feel free to call with any questions or concerns at any time, at [phone]. --Dr. Venetia Maxon

## 2012-12-29 NOTE — Assessment & Plan Note (Signed)
Followed by pain clinic, who manage pain medications and reportedly are giving pt epidural steroid injections, which may be affecting his blood sugar levels. F/u with pain clinic as needed.

## 2012-12-29 NOTE — Assessment & Plan Note (Signed)
CMP without elevation of LFT's. Lipid panel suggests compliance with Zocor. No new side effects of statin reported. Continue Zocor 20 daily.

## 2012-12-29 NOTE — Assessment & Plan Note (Signed)
Reasonable control with current meds. Continue without change (Cozaar, Coreg, Lasix, Norvasc). F/u as needed.

## 2012-12-29 NOTE — Assessment & Plan Note (Addendum)
A: Reports compliance with medications (takes metformin regularly, but does "occasionally" reduce his evening dose of 70/30 insulin). Does not regularly check CBG's. A1c 10 and CBG >400 on CMP this visit; A1c actually improved from last reading and pt without signs/symptoms to suggest HONK state or DKA. No ulcers noted on foot exam, but pt with complaint of knot on left foot, not appreciated on my exam.  P: Continue insulin 70/30, metformin. Reinforced compliance with medications. Will need to consider re-referral to pharmacy clinic; if hypoglycemic in the mornings, may need to adjust doses of insulin, but difficult to assess with no CBG records. Counseled on daily foot care/hygiene. Reiterated CBG checks at home. F/u in 2-3 months or sooner if needed.

## 2012-12-29 NOTE — Progress Notes (Signed)
  Subjective:    Patient ID: Anthony [name], male    DOB: 1951-01-11, 62 y.o.   MRN: [id number]  HPI: Pt presents to clinic for general f/u. No large current complaints.  DM - last A1c >14. Pt pleased to hear that it is 10.0, today; states he has been on epidural steroids at his pain doctor this month x3.  -compliant with metformin; reports taking insulin 70/30 BID 60 units; sometimes reduces to 30 units at night due to waking at night feeling 'jittery'  -reports a "knot" on the bottom of his left foot with pain in his heel, for 2-3 weeks, worse over the past week  -no bleeding, bruising/blisters of foot, no ulcers noted  -does not check CBG's often, usually only in the AM, "a few times per month"; last check 167 day prior to this appointment  HTN - Reports compliance with amlodipine, Coreg, Cozaar, Lasix, Imdur.  -no headache, change in vision, palpitations, SOB  HLD - reports compliance with Zocor, unsure of his last lipid panel (last in [facility] was 02/2012)  -no new muscle/body aches  Health maintenance -has never had a colonoscopy -has had retinal photography at our office in the last year  Pt is a former smoker. In addition to the above documentation, pt's PMH, surgical history, FH, and SH all reviewed and updated where appropriate in the EMR. I have also reviewed and updated the pt's allergies and current medications as appropriate.  Review of Systems: As above. Otherwise, full 12-system ROS was reviewed and all negative.     Objective:   Physical Exam BP 136/55  Pulse 76  Temp(Src) 99 F (37.2 C) (Oral)  Ht 6\' 1"  (1.854 m)  Wt 203 lb (92.08 kg)  BMI 26.79 kg/m2 Gen: well-appearing elderly male in NAD HEENT: [place]/AT, sclerae/conjunctivae clear, no lid lag, EOMI, PERRLA   MMM, posterior oropharynx clear, no cervical lymphadenopathy  neck supple with full ROM, no masses appreciated; thyroid not enlarged  Cardio: RRR, no murmur appreciated; distal pulses intact/symmetric Pulm:  CTAB, no wheezes, normal WOB  Abd: soft, nondistended, BS+, no HSM Ext: warm/well-perfused, no cyanosis/clubbing/edema MSK: strength 5/5 in all four extremities, no frank joint deformity/effusion;   normal ROM to all four extremities with no point muscle/bony tenderness in spine Neuro/Psych: alert/oriented, sensation grossly intact; normal gait/balance  mood euthymic with congruent affect Diabetic foot exam: see documentation in EPIC  Diminished sensation to plantar surfaces of toes and midfoot, bilaterally.   No frank ulcerations noted. Few calluses to lateral and plantar feet noted.  No knot palpated in area of left foot indicated by pt as above in HPI, though there is a plantar callus.     Assessment & Plan:

## 2012-12-29 NOTE — Assessment & Plan Note (Signed)
Referred to GI for colonoscopy given age >96 and no prior screening. Consider urine microalbumin given DM at next visit. Will need to f/u need for Tdap and Zoster vaccine.

## 2012-12-31 ENCOUNTER — Other Ambulatory Visit: Payer: Self-pay | Admitting: Family Medicine

## 2012-12-31 MED ORDER — INSULIN NPH ISOPHANE & REGULAR (70-30) 100 UNIT/ML ~~LOC~~ SUSP: 60.0000 [IU] | Freq: Two times a day (BID) | SUBCUTANEOUS | Status: DC

## 2012-12-31 NOTE — Telephone Encounter (Signed)
Routed to MD for refill Tildon Husky, RN-BSN

## 2012-12-31 NOTE — Telephone Encounter (Signed)
Patient is calling for a Rx for Novolin 70/30 to go to [facility].  They do not currently have a Rx for this because he was getting it for free because he didn't have insurance, but now he does.

## 2013-01-04 ENCOUNTER — Encounter: Payer: Self-pay | Admitting: Gastroenterology

## 2013-01-06 ENCOUNTER — Ambulatory Visit (AMBULATORY_SURGERY_CENTER): Payer: BC Managed Care – PPO | Admitting: *Deleted

## 2013-01-06 ENCOUNTER — Encounter: Payer: Self-pay | Admitting: Gastroenterology

## 2013-01-06 VITALS — Ht 73.0 in | Wt 203.0 lb

## 2013-01-06 DIAGNOSIS — Z1211 Encounter for screening for malignant neoplasm of colon: Secondary | ICD-10-CM

## 2013-01-06 MED ORDER — MOVIPREP 100 G PO SOLR: ORAL | Status: DC

## 2013-01-06 NOTE — Progress Notes (Signed)
Patient denies any allergies to eggs or soy. Patient denies any problems with anesthesia.  

## 2013-01-13 ENCOUNTER — Telehealth: Payer: Self-pay | Admitting: Family Medicine

## 2013-01-13 ENCOUNTER — Ambulatory Visit (HOSPITAL_COMMUNITY)
Admission: RE | Admit: 2013-01-13 | Discharge: 2013-01-13 | Disposition: A | Discharge status: Home / Self Care | Payer: BC Managed Care – PPO | Source: Ambulatory Visit | Attending: Family Medicine | Admitting: Family Medicine

## 2013-01-13 ENCOUNTER — Encounter: Payer: Self-pay | Admitting: Family Medicine

## 2013-01-13 ENCOUNTER — Ambulatory Visit (INDEPENDENT_AMBULATORY_CARE_PROVIDER_SITE_OTHER): Payer: BC Managed Care – PPO | Admitting: Family Medicine

## 2013-01-13 VITALS — BP 127/78 | HR 71 | Ht 73.0 in | Wt 204.0 lb

## 2013-01-13 DIAGNOSIS — M79609 Pain in unspecified limb: Secondary | ICD-10-CM | POA: Insufficient documentation

## 2013-01-13 DIAGNOSIS — E119 Type 2 diabetes mellitus without complications: Secondary | ICD-10-CM | POA: Insufficient documentation

## 2013-01-13 DIAGNOSIS — S98139A Complete traumatic amputation of one unspecified lesser toe, initial encounter: Secondary | ICD-10-CM | POA: Insufficient documentation

## 2013-01-13 DIAGNOSIS — M79672 Pain in left foot: Secondary | ICD-10-CM

## 2013-01-13 DIAGNOSIS — L02429 Furuncle of limb, unspecified: Secondary | ICD-10-CM

## 2013-01-13 MED ORDER — TRAMADOL HCL 50 MG PO TABS: 50.0000 mg | ORAL_TABLET | Freq: Three times a day (TID) | ORAL | Status: DC | PRN

## 2013-01-13 NOTE — Telephone Encounter (Signed)
Will forward to Dr Venetia Maxon

## 2013-01-13 NOTE — Telephone Encounter (Signed)
Called pt to discuss xray results. No acute fracture or evidence for infection. Advised supportive care, stretching exercises, ice, rest. Rx printed for tramadol and left at front desk. See also my forthcoming progress note. Thanks! --CMS

## 2013-01-13 NOTE — Patient Instructions (Signed)
Thank you for coming in, today! I'm not sure what is causing your pain, so I want to get an xray. You can go to [facility] at any time to get this. You could have something called plantar fasciitis, inflammation of the soft tissues of your foot. You can do some exercises to help this. One thing to try is to get a cold soda can and roll it under your foot with gentle pressure to stretch your foot out. Make an appointment to see me after you get the xray. If you have any fevers/chills, notice any ulcers on your feet, have any redness of your foot or up into your leg, call or come back sooner. Please feel free to call with any questions or concerns at any time, at [phone]. --Dr. Venetia Maxon  Plantar Fasciitis Plantar fasciitis is a common condition that causes foot pain. It is soreness (inflammation) of the band of tough fibrous tissue on the bottom of the foot that runs from the heel bone (calcaneus) to the ball of the foot. The cause of this soreness may be from excessive standing, poor fitting shoes, running on hard surfaces, being overweight, having an abnormal walk, or overuse (this is common in runners) of the painful foot or feet. It is also common in aerobic exercise dancers and ballet dancers. SYMPTOMS  Most people with plantar fasciitis complain of:  Severe pain in the morning on the bottom of their foot especially when taking the first steps out of bed. This pain recedes after a few minutes of walking.  Severe pain is experienced also during walking following a long period of inactivity.  Pain is worse when walking barefoot or up stairs DIAGNOSIS   Your caregiver will diagnose this condition by examining and feeling your foot.  Special tests such as X-rays of your foot, are usually not needed. PREVENTION   Consult a sports medicine professional before beginning a new exercise program.  Walking programs offer a good workout. With walking there is a lower chance of overuse injuries  common to runners. There is less impact and less jarring of the joints.  Begin all new exercise programs slowly. If problems or pain develop, decrease the amount of time or distance until you are at a comfortable level.  Wear good shoes and replace them regularly.  Stretch your foot and the heel cords at the back of the ankle (Achilles tendon) both before and after exercise.  Run or exercise on even surfaces that are not hard. For example, asphalt is better than pavement.  Do not run barefoot on hard surfaces.  If using a treadmill, vary the incline.  Do not continue to workout if you have foot or joint problems. Seek professional help if they do not improve. HOME CARE INSTRUCTIONS   Avoid activities that cause you pain until you recover.  Use ice or cold packs on the problem or painful areas after working out.  Only take over-the-counter or prescription medicines for pain, discomfort, or fever as directed by your caregiver.  Soft shoe inserts or athletic shoes with air or gel sole cushions may be helpful.  If problems continue or become more severe, consult a sports medicine caregiver or your own health care provider. Cortisone is a potent anti-inflammatory medication that may be injected into the painful area. You can discuss this treatment with your caregiver. MAKE SURE YOU:   Understand these instructions.  Will watch your condition.  Will get help right away if you are not doing well  or get worse. Document Released: 02/05/2001 Document Revised: 08/05/2011 Document Reviewed: 04/06/2008 [facility] Patient Information 2014 [place], [place].

## 2013-01-13 NOTE — Telephone Encounter (Signed)
Pt called and would like Dr. Venetia Maxon to call him when he receive's the xray results so that he know's what is going on. JW

## 2013-01-16 NOTE — Assessment & Plan Note (Signed)
A: Uncertain cause; not classic picture of plantar fasciitis, though this is possible. Discussed with Dr. Mingo Amber. Xray shows no acute abnormality (no fracture, no bony destruction suggestive of osteo) and only mild degenerative change in the midfoot.  P: Rx for tramadol, instructed to take Tylenol as well if needed, and to try exercises for range of motion. Suggested rolling cold beverage can against the floor to stretch foot out in place of just icing. Given work excuse note for several days, as well. Follow up PRN. Could consider referral to physical therapy and/or more advanced imaging if persists.

## 2013-01-16 NOTE — Progress Notes (Signed)
  Subjective:    Patient ID: Anthony [name], male    DOB: 06-07-1950, 62 y.o.   MRN: [id number]  HPI: Pt presents to clinic for pain in the bottom of his left foot. Of note, pt is diabetic and had recent clinic visit for general follow-up with complaint of a "knot" on the bottom of his foot at that time, which was not appreciated on that exam. Pt reports pain has been present for about 1 month, and pain is "inside" his foot, worse with standing/walking; pain is NOT worst when first walking and them improved, but rather gets worse through the day/the more he walks. Pain feels like a "knot" inside his mid-foot, sometimes burning/aching, mostly a "nagging pain" all the time, occasionally stabbing and sometimes radiating back to his heel and up into his leg. Denies ulcers/sores, no tightness in the bottom of his foot, no pulling sensation. Pt denies recent increased activity/walking/standing for long periods. 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 Pt has taken no medications for this; has taken tramadol recently for chronic back pain (previously on oxycodone prescribed by pain clinic, has been doing steroid injections instead, recently). Has not taken medications specifically for this pain.  DM - Recent follow-up as above, reports compliance with meds and CBG's in the 130's-160's in the mornings. No change from recent visit.  Review of Systems: As above, no systemic symptoms such as fever/chills, N/V, abd or chest pain, SOB. Generally feels "pretty okay," other than his foot.     Objective:   Physical Exam BP 127/78  Pulse 71  Ht 6\' 1"  (1.854 m)  Wt 204 lb (92.534 kg)  BMI 26.92 kg/m2  Gen: adult male in NAD but uncomfortable with standing/walking; antalgic gait Left foot: visually skin intact  without ulceration/abrasion or other lesion; s/p second toe amputation, well-healed  No pain with squeeze across MTP joint, mild/moderate pain with squeeze across midfoot and heel  Vague soft-tissue fullness in midfoot most apparent with asking pt to extend and then flex toes, but no frank knot/mass appreciated  ROM intact to ankle, toes, and foot itself (full flexion/extension, inversion/eversion, pronation/suppination)     Assessment & Plan:

## 2013-01-20 ENCOUNTER — Encounter: Payer: BC Managed Care – PPO | Admitting: Gastroenterology

## 2013-01-21 ENCOUNTER — Encounter: Payer: Self-pay | Admitting: Cardiology

## 2013-01-21 ENCOUNTER — Ambulatory Visit (INDEPENDENT_AMBULATORY_CARE_PROVIDER_SITE_OTHER): Payer: BC Managed Care – PPO | Admitting: Cardiology

## 2013-01-21 VITALS — BP 144/80 | HR 68 | Ht 73.0 in | Wt 204.0 lb

## 2013-01-21 DIAGNOSIS — IMO0001 Reserved for inherently not codable concepts without codable children: Secondary | ICD-10-CM

## 2013-01-21 DIAGNOSIS — E1165 Type 2 diabetes mellitus with hyperglycemia: Secondary | ICD-10-CM

## 2013-01-21 DIAGNOSIS — E7800 Pure hypercholesterolemia, unspecified: Secondary | ICD-10-CM

## 2013-01-21 DIAGNOSIS — I2510 Atherosclerotic heart disease of native coronary artery without angina pectoris: Secondary | ICD-10-CM

## 2013-01-21 DIAGNOSIS — IMO0002 Reserved for concepts with insufficient information to code with codable children: Secondary | ICD-10-CM

## 2013-01-21 NOTE — Progress Notes (Signed)
HPI Anthony [name] returns today for evaluation and management of his history of coronary artery disease, history of myocardial infarction, history of congestive heart failure.   Last saw him, he's been doing relatively well. He denies any angina or ischemic equivalence. He is compliant with his medications. He denies orthopnea, PND or increased edema.  Past Medical History  Diagnosis Date  . Diabetes mellitus   . Arthritis   . CHF (congestive heart failure)   . Hypertension   . Hyperlipidemia   . Depression   . Erectile dysfunction   . Onychomycosis   . GERD (gastroesophageal reflux disease)   . Edema of extremities   . Myocardial infarction 2000&2001    Current Outpatient Prescriptions  Medication Sig Dispense Refill  . amLODipine (NORVASC) 10 MG tablet Take 1 tablet (10 mg total) by mouth at bedtime.  90 tablet  3  . aspirin 81 MG EC tablet Take 1 tablet (81 mg total) by mouth daily.  30 tablet  12  . carvedilol (COREG) 25 MG tablet Take 1 tablet (25 mg total) by mouth 2 (two) times daily with a meal.  180 tablet  3  . doxycycline (VIBRA-TABS) 100 MG tablet Take 1 tablet (100 mg total) by mouth 2 (two) times daily.  20 tablet  0  . furosemide (LASIX) 40 MG tablet Take 1 tablet (40 mg total) by mouth daily.  90 tablet  2  . insulin NPH-regular (NOVOLIN 70/30) (70-30) 100 UNIT/ML injection Inject 60 Units into the skin 2 (two) times daily.  10 mL  11  . isosorbide mononitrate (IMDUR) 60 MG 24 hr tablet TAKE 1 TABLET BY MOUTH EVERY DAY  60 tablet  5  . losartan (COZAAR) 100 MG tablet Take 1 tablet (100 mg total) by mouth daily.  90 tablet  2  . metFORMIN (GLUCOPHAGE) 1000 MG tablet Take 1 tablet (1,000 mg total) by mouth 2 (two) times daily with a meal.  180 tablet  1  . Multiple Vitamin (MULTIVITAMIN) tablet Take 1 tablet by mouth daily.        . nitroGLYCERIN (NITROSTAT) 0.4 MG SL tablet Place 0.4 mg under the tongue every 5 (five) minutes as needed. If no relief by 3rd tab, call 911        . Omega-3 Fatty Acids (FISH OIL) 1000 MG CAPS Take 1 capsule by mouth daily.        Marland Kitchen oxyCODONE (OXYCONTIN) 10 MG 12 hr tablet Take 10 mg by mouth 3 (three) times daily. This was rx'd by pain md       . PARoxetine (PAXIL) 40 MG tablet TAKE 1 TABLET (40 MG TOTAL) BY MOUTH DAILY.  90 tablet  3  . potassium chloride (KLOR-CON M10) 10 MEQ tablet TAKE 2 TABLETS BY MOUTH ONCE DAILY  180 tablet  4  . simvastatin (ZOCOR) 20 MG tablet Take 1 tablet (20 mg total) by mouth at bedtime.  90 tablet  3  . traMADol (ULTRAM) 50 MG tablet Take 1 tablet (50 mg total) by mouth every 8 (eight) hours as needed for pain.  50 tablet  0   No current facility-administered medications for this visit.    Allergies  Allergen Reactions  . Enalapril Maleate Cough    REACTION: cough    Family History  Problem Relation Age of Onset  . Colon cancer Neg Hx   . Stomach cancer Neg Hx     History   Social History  . Marital Status: Married  Spouse Name: N/A    Number of Children: N/A  . Years of Education: N/A   Occupational History  . Not on file.   Social History Main Topics  . Smoking status: Current Some Day Smoker -- 0.30 packs/day    Types: Cigarettes    Start date: 05/28/1967    Last Attempt to Quit: 03/04/2010  . Smokeless tobacco: Never Used  . Alcohol Use: No  . Drug Use: No  . Sexual Activity: Not on file   Other Topics Concern  . Not on file   Social History Narrative  . No narrative on file    ROS ALL NEGATIVE EXCEPT THOSE NOTED IN HPI  PE  General Appearance: well developed, well nourished in no acute distress, obese HEENT: symmetrical face, PERRLA, good dentition  Neck: no JVD, thyromegaly, or adenopathy, trachea midline Chest: symmetric without deformity Cardiac: PMI non-displaced, RRR, normal S1, S2, no gallop or murmur Lung: clear to ausculation and percussion Vascular: all pulses full without bruits  Abdominal: nondistended, nontender, good bowel sounds, no HSM, no  bruits Extremities: no cyanosis, clubbing, 1+ pitting edema, no sign of DVT, no varicosities  Skin: normal color, no rashes Neuro: alert and oriented x 3, non-focal Pysch: normal affect  EKG Normal sinus rhythm, nonspecific T wave changes BMET    Component Value Date/Time   NA 135 12/24/2012 1013   K 4.6 12/24/2012 1013   CL 102 12/24/2012 1013   CO2 26 12/24/2012 1013   GLUCOSE 436* 12/24/2012 1013   BUN 13 12/24/2012 1013   CREATININE 0.99 12/24/2012 1013   CREATININE 1.06 09/18/2010 0545   CALCIUM 9.1 12/24/2012 1013   GFRNONAA >60 09/18/2010 0545   GFRAA  Value: >60        The eGFR has been calculated using the MDRD equation. This calculation has not been validated in all clinical situations. eGFR's persistently <60 mL/min signify possible Chronic Kidney Disease. 09/18/2010 0545    Lipid Panel     Component Value Date/Time   CHOL 106 12/24/2012 1013   TRIG 52 12/24/2012 1013   HDL 46 12/24/2012 1013   CHOLHDL 2.3 12/24/2012 1013   VLDL 10 12/24/2012 1013   LDLCALC 50 12/24/2012 1013    CBC    Component Value Date/Time   WBC 10.3 09/18/2010 0545   RBC 4.96 09/18/2010 0545   HGB 14.1 09/18/2010 0545   HCT 41.0 09/18/2010 0545   PLT 141* 09/18/2010 0545   MCV 82.7 09/18/2010 0545   MCH 28.4 09/18/2010 0545   MCHC 34.4 09/18/2010 0545   RDW 13.2 09/18/2010 0545   LYMPHSABS 1.9 09/15/2010 0542   MONOABS 0.6 09/15/2010 0542   EOSABS 0.7 09/15/2010 0542   BASOSABS 0.0 09/15/2010 0542

## 2013-01-21 NOTE — Patient Instructions (Addendum)
Your physician recommends that you continue on your current medications as directed. Please refer to the Current Medication list given to you today.  Your physician wants you to follow-up in: 1 year with Dr. Meda Coffee.  You will receive a reminder letter in the mail two months in advance. If you don't receive a letter, please call our office to schedule the follow-up appointment.

## 2013-01-21 NOTE — Assessment & Plan Note (Signed)
Stable. Continue secondary preventative therapy. I'll have him followup with Dr. Meda Coffee in a year.

## 2013-01-26 ENCOUNTER — Other Ambulatory Visit: Payer: Self-pay | Admitting: *Deleted

## 2013-01-26 MED ORDER — INSULIN NPH ISOPHANE & REGULAR (70-30) 100 UNIT/ML ~~LOC~~ SUSP: 60.0000 [IU] | Freq: Two times a day (BID) | SUBCUTANEOUS | Status: DC

## 2013-01-27 ENCOUNTER — Other Ambulatory Visit: Payer: Self-pay | Admitting: *Deleted

## 2013-01-27 NOTE — Telephone Encounter (Signed)
Please double check this Rx request--I filled this yesterday. Does it need to be re-ordered for some reason? Thanks. --CMS

## 2013-01-28 NOTE — Telephone Encounter (Signed)
Called [facility] - they have this Rx request already. Pt can pick up at his convenience. Thanks! --CMS

## 2013-02-04 ENCOUNTER — Telehealth: Payer: Self-pay | Admitting: Family Medicine

## 2013-02-04 DIAGNOSIS — IMO0002 Reserved for concepts with insufficient information to code with codable children: Secondary | ICD-10-CM

## 2013-02-04 DIAGNOSIS — E1165 Type 2 diabetes mellitus with hyperglycemia: Secondary | ICD-10-CM

## 2013-02-04 MED ORDER — INSULIN NPH ISOPHANE & REGULAR (70-30) 100 UNIT/ML ~~LOC~~ SUSP: 60.0000 [IU] | Freq: Two times a day (BID) | SUBCUTANEOUS | Status: DC

## 2013-02-04 NOTE — Telephone Encounter (Signed)
Received rejection notice for insulin 70/30 Rx. Discussed with pharmacy; pt needed new Rx specifying 90-day supply. Rx sent in via e-script for 11 (eleven) 10 mL vials (for 90-day supply) with 3 fills (total Rx for 1 year). Verified sig and dispense amount with pharmacist. Please call patient to notify him that insulin should be ready to pick up. Thanks. --CMS

## 2013-02-08 ENCOUNTER — Other Ambulatory Visit: Payer: Self-pay | Admitting: Family Medicine

## 2013-02-19 ENCOUNTER — Institutional Professional Consult (permissible substitution): Payer: Self-pay | Admitting: Neurology

## 2013-02-19 ENCOUNTER — Telehealth: Payer: Self-pay

## 2013-02-19 NOTE — Telephone Encounter (Signed)
I called patient and let him know that we have to reschedule due to provider not in office today. Appointment rescheduled for Monday February 22, 2013 at 1:30 p.m.

## 2013-02-22 ENCOUNTER — Ambulatory Visit (INDEPENDENT_AMBULATORY_CARE_PROVIDER_SITE_OTHER): Payer: BC Managed Care – PPO | Admitting: Neurology

## 2013-02-22 ENCOUNTER — Encounter: Payer: Self-pay | Admitting: Neurology

## 2013-02-22 VITALS — BP 117/70 | HR 70 | Resp 16 | Ht 72.0 in | Wt 209.0 lb

## 2013-02-22 DIAGNOSIS — F17200 Nicotine dependence, unspecified, uncomplicated: Secondary | ICD-10-CM

## 2013-02-22 DIAGNOSIS — R0683 Snoring: Secondary | ICD-10-CM

## 2013-02-22 DIAGNOSIS — G4730 Sleep apnea, unspecified: Secondary | ICD-10-CM | POA: Insufficient documentation

## 2013-02-22 DIAGNOSIS — G4710 Hypersomnia, unspecified: Secondary | ICD-10-CM

## 2013-02-22 DIAGNOSIS — I2510 Atherosclerotic heart disease of native coronary artery without angina pectoris: Secondary | ICD-10-CM

## 2013-02-22 DIAGNOSIS — R0609 Other forms of dyspnea: Secondary | ICD-10-CM

## 2013-02-22 DIAGNOSIS — R0989 Other specified symptoms and signs involving the circulatory and respiratory systems: Secondary | ICD-10-CM

## 2013-02-22 DIAGNOSIS — G4733 Obstructive sleep apnea (adult) (pediatric): Secondary | ICD-10-CM | POA: Insufficient documentation

## 2013-02-22 NOTE — Patient Instructions (Signed)
Sleep Apnea Sleep apnea is disorder that affects a person's sleep. A person with sleep apnea has abnormal pauses in their breathing when they sleep. It is hard for them to get a good sleep. This makes a person tired during the day. It also can lead to other physical problems. There are three types of sleep apnea. One type is when breathing stops for a short time because your airway is blocked (obstructive sleep apnea). Another type is when the brain sometimes fails to give the normal signal to breathe to the muscles that control your breathing (central sleep apnea). The third type is a combination of the other two types. HOME CARE  Do not sleep on your back. Try to sleep on your side.  Take all medicine as told by your doctor.  Avoid alcohol, calming medicines (sedatives), and depressant drugs.  Try to lose weight if you are overweight. Talk to your doctor about a healthy weight goal. Your doctor may have you use a device that helps to open your airway. It can help you get the air that you need. It is called a positive airway pressure (PAP) device. There are three types of PAP devices:  Continuous positive airway pressure (CPAP) device.  Nasal expiratory positive airway pressure (EPAP) device.  Bilevel positive airway pressure (BPAP) device. MAKE SURE YOU:  Understand these instructions.  Will watch your condition.  Will get help right away if you are not doing well or get worse. Document Released: 02/20/2008 Document Revised: 04/29/2012 Document Reviewed: 09/14/2011 [facility] Patient Information 2014 [place]. Sleep Apnea Sleep apnea is disorder that affects a person's sleep. A person with sleep apnea has abnormal pauses in their breathing when they sleep. It is hard for them to get a good sleep. This makes a person tired during the day. It also can lead to other physical problems. There are three types of sleep apnea. One type is when breathing stops for a short time because your  airway is blocked (obstructive sleep apnea). Another type is when the brain sometimes fails to give the normal signal to breathe to the muscles that control your breathing (central sleep apnea). The third type is a combination of the other two types. HOME CARE  Do not sleep on your back. Try to sleep on your side.  Take all medicine as told by your doctor.  Avoid alcohol, calming medicines (sedatives), and depressant drugs.  Try to lose weight if you are overweight. Talk to your doctor about a healthy weight goal. Your doctor may have you use a device that helps to open your airway. It can help you get the air that you need. It is called a positive airway pressure (PAP) device. There are three types of PAP devices:  Continuous positive airway pressure (CPAP) device.  Nasal expiratory positive airway pressure (EPAP) device.  Bilevel positive airway pressure (BPAP) device. MAKE SURE YOU:  Understand these instructions.  Will watch your condition.  Will get help right away if you are not doing well or get worse. Document Released: 02/20/2008 Document Revised: 04/29/2012 Document Reviewed: 09/14/2011 [facility] Patient Information 2014 [place]. Smoking Cessation, Tips for Success YOU CAN QUIT SMOKING If you are ready to quit smoking, congratulations! You have chosen to help yourself be healthier. Cigarettes bring nicotine, tar, carbon monoxide, and other irritants into your body. Your lungs, heart, and blood vessels will be able to work better without these poisons. There are many different ways to quit smoking. Nicotine gum, nicotine patches, a  nicotine inhaler, or nicotine nasal spray can help with physical craving. Hypnosis, support groups, and medicines help break the habit of smoking. Here are some tips to help you quit for good.  Throw away all cigarettes.  Clean and remove all ashtrays from your home, work, and car.  On a card, write down your reasons for quitting. Carry  the card with you and read it when you get the urge to smoke.  Cleanse your body of nicotine. Drink enough water and fluids to keep your urine clear or pale yellow. Do this after quitting to flush the nicotine from your body.  Learn to predict your moods. Do not let a bad situation be your excuse to have a cigarette. Some situations in your life might tempt you into wanting a cigarette.  Never have "just one" cigarette. It leads to wanting another and another. Remind yourself of your decision to quit.  Change habits associated with smoking. If you smoked while driving or when feeling stressed, try other activities to replace smoking. Stand up when drinking your coffee. Brush your teeth after eating. Sit in a different chair when you read the paper. Avoid alcohol while trying to quit, and try to drink fewer caffeinated beverages. Alcohol and caffeine may urge you to smoke.  Avoid foods and drinks that can trigger a desire to smoke, such as sugary or spicy foods and alcohol.  Ask people who smoke not to smoke around you.  Have something planned to do right after eating or having a cup of coffee. Take a walk or exercise to perk you up. This will help to keep you from overeating.  Try a relaxation exercise to calm you down and decrease your stress. Remember, you may be tense and nervous for the first 2 weeks after you quit, but this will pass.  Find new activities to keep your hands busy. Play with a pen, coin, or rubber band. Doodle or draw things on paper.  Brush your teeth right after eating. This will help cut down on the craving for the taste of tobacco after meals. You can try mouthwash, too.  Use oral substitutes, such as lemon drops, carrots, a cinnamon stick, or chewing gum, in place of cigarettes. Keep them handy so they are available when you have the urge to smoke.  When you have the urge to smoke, try deep breathing.  Designate your home as a nonsmoking area.  If you are a heavy  smoker, ask your caregiver about a prescription for nicotine chewing gum. It can ease your withdrawal from nicotine.  Reward yourself. Set aside the cigarette money you save and buy yourself something nice.  Look for support from others. Join a support group or smoking cessation program. Ask someone at home or at work to help you with your plan to quit smoking.  Always ask yourself, "Do I need this cigarette or is this just a reflex?" Tell yourself, "Today, I choose not to smoke," or "I do not want to smoke." You are reminding yourself of your decision to quit, even if you do smoke a cigarette. HOW WILL I FEEL WHEN I QUIT SMOKING?  The benefits of not smoking start within days of quitting.  You may have symptoms of withdrawal because your body is used to nicotine (the addictive substance in cigarettes). You may crave cigarettes, be irritable, feel very hungry, cough often, get headaches, or have difficulty concentrating.  The withdrawal symptoms are only temporary. They are strongest when you first quit  but will go away within 10 to 14 days.  When withdrawal symptoms occur, stay in control. Think about your reasons for quitting. Remind yourself that these are signs that your body is healing and getting used to being without cigarettes.  Remember that withdrawal symptoms are easier to treat than the major diseases that smoking can cause.  Even after the withdrawal is over, expect periodic urges to smoke. However, these cravings are generally short-lived and will go away whether you smoke or not. Do not smoke!  If you relapse and smoke again, do not lose hope. Most smokers quit 3 times before they are successful.  If you relapse, do not give up! Plan ahead and think about what you will do the next time you get the urge to smoke. LIFE AS A NONSMOKER: MAKE IT FOR A MONTH, MAKE IT FOR LIFE Day 1: Hang this page where you will see it every day. Day 2: Get rid of all ashtrays, matches, and  lighters. Day 3: Drink water. Breathe deeply between sips. Day 4: Avoid places with smoke-filled air, such as bars, clubs, or the smoking section of restaurants. Day 5: Keep track of how much money you save by not smoking. Day 6: Avoid boredom. Keep a good book with you or go to the movies. Day 7: Reward yourself! One week without smoking! Day 8: Make a dental appointment to get your teeth cleaned. Day 9: Decide how you will turn down a cigarette before it is offered to you. Day 10: Review your reasons for quitting. Day 11: Distract yourself. Stay active to keep your mind off smoking and to relieve tension. Take a walk, exercise, read a book, do a crossword puzzle, or try a new hobby. Day 12: Exercise. Get off the bus before your stop or use stairs instead of escalators. Day 13: Call on friends for support and encouragement. Day 14: Reward yourself! Two weeks without smoking! Day 15: Practice deep breathing exercises. Day 16: Bet a friend that you can stay a nonsmoker. Day 17: Ask to sit in nonsmoking sections of restaurants. Day 18: Hang up "No Smoking" signs. Day 19: Think of yourself as a nonsmoker. Day 20: Each morning, tell yourself you will not smoke. Day 21: Reward yourself! Three weeks without smoking! Day 22: Think of smoking in negative ways. Remember how it stains your teeth, gives you bad breath, and leaves you short of breath. Day 23: Eat a nutritious breakfast. Day 24:Do not relive your days as a smoker. Day 25: Hold a pencil in your hand when talking on the telephone. Day 26: Tell all your friends you do not smoke. Day 27: Think about how much better food tastes. Day 28: Remember, one cigarette is one too many. Day 29: Take up a hobby that will keep your hands busy. Day 30: Congratulations! One month without smoking! Give yourself a big reward. Your caregiver can direct you to community resources or hospitals for support, which may include:  Group  support.  Education.  Hypnosis.  Subliminal therapy. Document Released: 02/09/2004 Document Revised: 08/05/2011 Document Reviewed: 02/27/2009 [facility] Patient Information 2014 [place], [place].

## 2013-02-22 NOTE — Progress Notes (Signed)
[facility]  Provider:  Larey Seat, M D  Referring Provider: Street, Sharon Mt, * Primary Care Physician:  Christa See, MD  Chief Complaint  Patient presents with  . New Evaluation    Bartko, r/o OSA, rm 10    HPI:  Anthony [name] is a 62 y.o. right handed african [place] male patient ,seen here as a referral  from Dr. Brien Few , for evaluation and treatment of possible sleep apnea.  Mr. bonds referral is dated 12-27 2013 and was signed by Dr. Marlaine Hind called and he. His referring physician indicated that the patient has diabetes chronic lower back pain radiating to the lower extremities, and that is treated wears chronic opiate therapy. The need for opioids in his pain therapy was concerned to be addressed in a sleep consultation and possible polysomnogram, mainly to rule out that central apneas will be triggered by pain medication.    [name] is 62 years of age and works as [employer] , Engineer, materials trucks, checking in and out of vehicles.  Exam of 11:30 PM and falls asleep very quickly, arises at 5 AM.  This  means that he only gets about 5-1/2 hours of sleep on average. He usually doesn't need the alarm he wakes up spontaneously. He has 2-3 times at night bathroom breaks interrupting his  sleep.  The patient endorses muscle cramps and lower extremity aches, constipation,  Witnessed  snoring, some depression.  He does not feel that he has to much or too little sleep. He did not endorse swelling in his legs, wheezing, hearing loss, incontinence to stool or urine.  No witnessed apneas.  He is a past medical history of hypertension, diabetes, and heart disease, he is an active smoker of 1/2 packs per day but he does not drink any alcohol, denies  Any use of illegal drugs.  He had nasal facial surgery, it appears like a cleft lip repair , but he states this is following an accident in his youth.     He is excessive sleepy in  daytime, drowsy when not physically active and stimulated.  Epworth 14 , FSS is 23, GDS 6 points.         Review of Systems: Out of a complete 14 system review, the patient complains of only the following symptoms, and all other reviewed systems are negative. Epworth 14. Fss 23, GDS 6 points.   cumulative sleep deprivation, daytime naps - up to 3 a day , short naps, not scheduled.  , nocturia 2-3 times, snoring , wife lets TV run ,  Leg and back pain.  Dry mouth in am,       History   Social History  . Marital Status: Married    Spouse Name: [name]    Number of Children: 2  . Years of Education: GED   Occupational History  .    [employer]     [employer]   Social History Main Topics  . Smoking status: Current Some Day Smoker -- 0.30 packs/day    Types: Cigarettes    Start date: 05/28/1967    Last Attempt to Quit: 03/04/2010  . Smokeless tobacco: Never Used     Comment: 1 pk in 2 days  . Alcohol Use: No     Comment: quit in 1993  . Drug Use: No  . Sexual Activity: Not on file   Other Topics Concern  . Not on file   Social History Narrative  Patient is right handed, consumes caffeine rarely. Patient resides in home with wife.    Family History  Problem Relation Age of Onset  . Colon cancer Neg Hx   . Stomach cancer Neg Hx     Past Medical History  Diagnosis Date  . Diabetes mellitus   . Arthritis   . CHF (congestive heart failure)   . Hypertension   . Hyperlipidemia   . Depression   . Erectile dysfunction   . Onychomycosis   . GERD (gastroesophageal reflux disease)   . Edema of extremities   . Myocardial infarction 2000&2001  . Obstructive apnea     Past Surgical History  Procedure Laterality Date  . Angioplasty  2001    stent x 1  . Toe amputation Left 2012  . Colonoscopy  2000?    negative    Current Outpatient Prescriptions  Medication Sig Dispense Refill  . oxycodone (OXY-IR) 5 MG capsule Take 5 mg by mouth every 4 (four) hours  as needed.      [employer] amLODipine (NORVASC) 10 MG tablet Take 1 tablet (10 mg total) by mouth at bedtime.  90 tablet  3  . aspirin 81 MG EC tablet Take 1 tablet (81 mg total) by mouth daily.  30 tablet  12  . carvedilol (COREG) 25 MG tablet Take 1 tablet (25 mg total) by mouth 2 (two) times daily with a meal.  180 tablet  3  . doxycycline (VIBRA-TABS) 100 MG tablet Take 1 tablet (100 mg total) by mouth 2 (two) times daily.  20 tablet  0  . furosemide (LASIX) 40 MG tablet Take 1 tablet (40 mg total) by mouth daily.  90 tablet  2  . insulin NPH-regular (NOVOLIN 70/30) (70-30) 100 UNIT/ML injection Inject 60 Units into the skin 2 (two) times daily.  110 mL  3  . isosorbide mononitrate (IMDUR) 60 MG 24 hr tablet TAKE 1 TABLET BY MOUTH EVERY DAY  60 tablet  5  . losartan (COZAAR) 100 MG tablet Take 1 tablet (100 mg total) by mouth daily.  90 tablet  2  . metFORMIN (GLUCOPHAGE) 1000 MG tablet TAKE 1 TABLET (1,000 MG TOTAL) BY MOUTH 2 (TWO) TIMES DAILY WITH A MEAL.  180 tablet  1  . Multiple Vitamin (MULTIVITAMIN) tablet Take 1 tablet by mouth daily.        . nitroGLYCERIN (NITROSTAT) 0.4 MG SL tablet Place 0.4 mg under the tongue every 5 (five) minutes as needed. If no relief by 3rd tab, call 911       . Omega-3 Fatty Acids (FISH OIL) 1000 MG CAPS Take 1 capsule by mouth daily.        [employer] PARoxetine (PAXIL) 40 MG tablet TAKE 1 TABLET (40 MG TOTAL) BY MOUTH DAILY.  90 tablet  3  . potassium chloride (KLOR-CON M10) 10 MEQ tablet TAKE 2 TABLETS BY MOUTH ONCE DAILY  180 tablet  4  . simvastatin (ZOCOR) 20 MG tablet Take 1 tablet (20 mg total) by mouth at bedtime.  90 tablet  3   No current facility-administered medications for this visit.    Allergies as of 02/22/2013 - Review Complete 02/22/2013  Allergen Reaction Noted  . Enalapril maleate Cough 08/19/2005    Vitals: BP 117/70  Pulse 70  Resp 16  Ht 6' (1.829 m)  Wt 209 lb (94.802 kg)  BMI 28.34 kg/m2 Last Weight:  Wt Readings from Last 1  Encounters:  02/22/13 209 lb (94.802 kg)   Last  Height:   Ht Readings from Last 1 Encounters:  02/22/13 6' (1.829 m)   BMI  .  Physical exam:  General: The patient is awake, alert and appears not in acute distress. The patient is well groomed. Head: Normocephalic, atraumatic. Neck is supple. Mallampati 3, neck circumference: 16.5  No nasal deviation. Cardiovascular:  Regular rate and rhythm  without  murmurs or carotid bruit, and without distended neck veins. Respiratory: Lungs are clear to auscultation. Skin:  Without evidence of edema, or rash Trunk: BMI is  Elevated, patient  has normal posture.  Neurologic exam : The patient is awake and alert, oriented to place and time.  Memory subjective  described as intact. There is a normal attention span & concentration ability. Speech is fluent  But hoarse  And slurred, with dysarthria, dysphonia , not  aphasia. Mood and affect are appropriate.  Cranial nerves: Pupils are equal and briskly reactive to light. Funduscopic exam withoutevidence of pallor or edema. Extraocular movements  in vertical and horizontal planes intact and without nystagmus. Visual fields by finger perimetry are intact. Hearing to finger rub intact.  Facial sensation intact to fine touch. Facial motor strength is symmetric and tongue and uvula move midline.  Motor exam:   Normal tone and normal muscle bulk - symmetric  strength in all extremities. Strong grip. He  reports trouble with screwdriver use. Cramping at the wrist. Spasms  In lower extremities. No RLS.   Sensory:  Fine touch, pinprick and vibration were tested in all extremities. Reduced pin prick, vibration and filament touch in both feet.  Coordination: Rapid alternating movements in the fingers/hands is tested and normal. Finger-to-nose maneuverwithout evidence  of ataxia, dysmetria or tremor.  Gait and station: Patient walks without assistive device and is able and assisted stool climb up to the exam table.  Strength within normal limits. Stance is stable and normal.  Deep tendon reflexes: in the  upper and lower extremities are attenuated,  symmetric -Babinski maneuver response is equivocal ..   Assessment:  After physical and neurologic examination, review of  pre-existing records. 1)  Possible snoring and OSA, central apneas are possible, but less likely  - no morning headaches, no witnessed apneas, no palpitation,   he is EDS, and has medical condition  Of DM, HTN CAD that can contribute to sleepiness.  He had 2 heart atatcks, 2 stents were placed in 2010-2001. And he smokes , COPD possible.   Plan:  Treatment plan and additional workup :   SPLIT at [facility] due to CAD, bronchitis ,HTN, DM 3 % score,   this patient is BC[employer] , non- medicare.

## 2013-03-03 ENCOUNTER — Ambulatory Visit (INDEPENDENT_AMBULATORY_CARE_PROVIDER_SITE_OTHER): Payer: BC Managed Care – PPO

## 2013-03-03 DIAGNOSIS — F17200 Nicotine dependence, unspecified, uncomplicated: Secondary | ICD-10-CM

## 2013-03-03 DIAGNOSIS — R0683 Snoring: Secondary | ICD-10-CM

## 2013-03-03 DIAGNOSIS — G4733 Obstructive sleep apnea (adult) (pediatric): Secondary | ICD-10-CM

## 2013-03-03 DIAGNOSIS — G4710 Hypersomnia, unspecified: Secondary | ICD-10-CM

## 2013-03-03 DIAGNOSIS — I2510 Atherosclerotic heart disease of native coronary artery without angina pectoris: Secondary | ICD-10-CM

## 2013-03-04 NOTE — Sleep Study (Signed)
Scored and ready to read.   Had some PVCS

## 2013-03-08 ENCOUNTER — Ambulatory Visit (INDEPENDENT_AMBULATORY_CARE_PROVIDER_SITE_OTHER): Payer: BC Managed Care – PPO | Admitting: *Deleted

## 2013-03-08 DIAGNOSIS — Z23 Encounter for immunization: Secondary | ICD-10-CM

## 2013-03-10 ENCOUNTER — Encounter: Payer: Self-pay | Admitting: *Deleted

## 2013-03-10 ENCOUNTER — Telehealth: Payer: Self-pay | Admitting: Neurology

## 2013-03-10 DIAGNOSIS — G4733 Obstructive sleep apnea (adult) (pediatric): Secondary | ICD-10-CM

## 2013-03-10 NOTE — Telephone Encounter (Signed)
I Called patient to discuss sleep study results.  Discussed findings, recommendations and follow up care.  Patient understood well and all questions were answered.  I informed the patient that I would send a copy of the report to his home and fax a copy to Dr.'s  [place].

## 2013-03-14 ENCOUNTER — Other Ambulatory Visit: Payer: Self-pay | Admitting: Family Medicine

## 2013-04-07 ENCOUNTER — Other Ambulatory Visit: Payer: Self-pay | Admitting: Family Medicine

## 2013-04-12 ENCOUNTER — Encounter: Payer: Self-pay | Admitting: Neurology

## 2013-04-20 ENCOUNTER — Other Ambulatory Visit: Payer: Self-pay | Admitting: Family Medicine

## 2013-06-02 ENCOUNTER — Ambulatory Visit: Payer: BC Managed Care – PPO | Admitting: Family Medicine

## 2013-06-08 ENCOUNTER — Ambulatory Visit (INDEPENDENT_AMBULATORY_CARE_PROVIDER_SITE_OTHER): Payer: BC Managed Care – PPO | Admitting: Family Medicine

## 2013-06-08 ENCOUNTER — Encounter: Payer: Self-pay | Admitting: Family Medicine

## 2013-06-08 VITALS — BP 144/83 | HR 62 | Ht 72.0 in | Wt 207.4 lb

## 2013-06-08 DIAGNOSIS — F4323 Adjustment disorder with mixed anxiety and depressed mood: Secondary | ICD-10-CM

## 2013-06-08 NOTE — Patient Instructions (Signed)
Thank you for coming in, today!  I think most of your symptoms are definitely related to your job. I'm sorry to hear all of the stress you've been through. I think some counseling might help deal with some of your anger, at least. I don't think medicine is going to help much, at least right now.  Work on getting the orange card. You can get information about this at the front desk. If you get the orange card, Dr. Gwenlyn Saran will be able to see you herself. Otherwise, [facility] [employer] clinic is a great place to try. Their number is [phone] and you can read their brochure.  Make an appointment to come back to see me in a month or two, to talk about your medical problems. If I can help at any point with this issue, please let me know.  Please feel free to call with any questions or concerns at any time, at [phone]. --Dr. Venetia Maxon

## 2013-06-09 NOTE — Assessment & Plan Note (Addendum)
Greater than 25 minutes spent in face-to-face time with this patient, more than half of which was devoted to counseling on acute stressors / depression-type symptoms and anger issues.   A: Grossly positive PHQ-9, negative MDQ in the setting of a distinct life stressor (loss of job), with a history of previously-well-controlled depression, stable for years on Paxil. Most if not all of current complaints seem to stem from losing his job, and pt has been proactive at least in seeking help to address his financial / work situation. Pt states he is interested in counseling but has not looked into it, yet; he is not interested in changing or switching his Paxil, as he has been doing well on it for a long time. He does seem to think that if he can get "closure" with his work situation, things will be "much better." Not acutely manic, psychotic, hallucinating or delusional, and not acutely suicidal / homicidal.  P: Discussed with Dr. Gwenlyn Saran; recommended that pt get the orange card so he can see her directly, in place of and/or in conjunction with other counseling. Provided pt with [facility] as they have sliding fee schedules and financial counseling available for their services. Pt appreciative of resources. Plan to f/u as needed and pt advised to return to clinic in the next several weeks to f/u on this and chronic medical issues.

## 2013-06-09 NOTE — Progress Notes (Signed)
   Subjective:    Patient ID: Anthony [name], male    DOB: [birth year], 63 y.o.   MRN: [id number]  HPI: Pt presents to clinic with concerns over increased depression / anger for the last few months. 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, and the co-worker thought pt's comments were directed at the co-worker - reports he has felt fidgety at times, guilty about not being able to provide (and that he was unable to "do Christmas right"), and very angry all the time - anger is greatest difficulty, but pt also has difficulty sleeping and interacting with family / friends due to frustration and "feels like a different person" - pt does have a history of depression and is on Paxil; prior to this distinct event, he felt "fine" and has felt well / controlled for "years" - pt has not seen any counseling services, but is interested, though concerned about being able to pay - pt does not yet have the orange card, but expresses interest in getting it (if he can; has applied for retirement) - pt is in discussion with a lawyer about appealing his termination, as well  Review of Systems: As above. Denies SI/HI, AH/VH. Denies coryza, fever, chills, SOB, increased abd pain PHQ-9: 22 points (3 for #1-4 and #6-7, 2 for #5 and #8, 0 for #9), "somewhat difficult" MDQ: negative screen (only 5 "yes" for #1, "yes" for #2, but "no problem" for #3; "no" for #4 and #5)     Objective:   Physical Exam BP 144/83  Pulse 62  Ht 6' (1.829 m)  Wt 207 lb 6.4 oz (94.076 kg)  BMI 28.12 kg/m2 Gen: well-appearing adult male, in NAD Psych: mood dysthymic with slightly blunted affect, good eye contact  Thought content normal, process linear / goal directed  Not actively hostile, no psychosis or delusion  Judgment and insight good     Assessment & Plan:  Greater than 25  minutes spent in face-to-face time with this patient, more than half of which was devoted to counseling on acute stressors / depression-type symptoms and anger issues.  See problem list note.

## 2013-06-11 ENCOUNTER — Telehealth: Payer: Self-pay | Admitting: Psychology

## 2013-06-11 NOTE — Telephone Encounter (Signed)
Mr. Viverito called to schedule an appointment.  Reviewed Dr. Ebony Hail note.  Patient reported he had BC/BS insurance.  I did not question this but asked him to call his insurance company to assure that he has benefits for mental health services.  Patient voiced an understanding.    Scheduled for Tuesday, January 20th at 11:00.  I told him the following: -  If he is unable to make the appointment he needs to call me. -  If he misses the appointment without a phone call, I won't be able to schedule him back in my clinic. He voiced an understanding and was able to repeat the appointment date and time back to me.

## 2013-06-15 ENCOUNTER — Ambulatory Visit (INDEPENDENT_AMBULATORY_CARE_PROVIDER_SITE_OTHER): Payer: BC Managed Care – PPO | Admitting: Psychology

## 2013-06-15 ENCOUNTER — Encounter: Payer: Self-pay | Admitting: Psychology

## 2013-06-15 DIAGNOSIS — F4310 Post-traumatic stress disorder, unspecified: Secondary | ICD-10-CM

## 2013-06-15 DIAGNOSIS — F3289 Other specified depressive episodes: Secondary | ICD-10-CM

## 2013-06-15 DIAGNOSIS — F329 Major depressive disorder, single episode, unspecified: Secondary | ICD-10-CM

## 2013-06-15 NOTE — Assessment & Plan Note (Signed)
Mr. Holyfield is groomed and dressed in jeans a shirt and a baseball cap.  He maintains good eye contact and is cooperative and attentive.  Speech is normal in tone and rhythm with a decreased rate.  Mood is reported as depressed with a restricted affect.  He is able to laugh twice but does not seem to fully engage his ocular muscles.  Thought process is logical and goal directed.  Denied suicidal ideation.  Admits to homicidal ideation.  Denies a plan or intent.  Denies hallucinations.  Able to maintain train of thought and concentrate on the questions.  Judgment and insight are average.  PHQ-9:  Not given today.  MDQ (if indicated):  Given by PCP and was negative.    Differential is a deterioration in what sounds like stable depression vs. PTSD or both.  He meets criteria for major depression, severe.  See PTSD problem.

## 2013-06-15 NOTE — Progress Notes (Signed)
Mr. Mekelburg presents for an initial psychological evaluation.  He was seen recently by his PCP Dr. Venetia Maxon where his mood and behavior was addressed.  HPI:  History of mental health issues detailed below.  Most recent symptoms started 02/24/2013 when he was fired from his job.  Worked at [employer] for 14-15 years with no corrective action.  States a co-worker thought a comment that he made in his blue-tooth was directed at her.  She lodged a complaint.  Patient states his manager asked him about it and requested a statement.  Mr. Tirella reports he wrote the statement and handed it to his Freight forwarder.  Later, when he followed up with HR, HR said they had no record of it.  Mr. Zerby faxed a copy to them.  He states he was fired by Psychologist, forensic shortly thereafter.  He attempted to clarify the situation but he states his manager would not allow him to tell his side of the story.  Since that time he has experienced the following symptoms:  - Intense anger that includes homicidal ideation.  He has no plans to initiate an attack on his manager or the woman that lodged the complaint but he is concerned what he would do if he saw them (accidentally).  He had thoughts of harming his manager on the day he was fired and did not act on them.  Denies suicidal ideation.  - Sadness or anger every day, all day since 02/24/2013.  Mr. Lundsten states that he has not noticed any positive emotion since then.  - Insomnia:  No trouble with sleep initiation but awakes during the night and starts thinking of the event; can't get back to sleep.  - Decrease in appetite.  Not sure if he has lost weight.  Occasionally only eats one meal per day.  - Decrease in energy, motivation.  - Decreased attention and concentration.  - Anhedonia.  - Intrusive thoughts and nightmares about the event.  Impact on function: He is severely functionally impaired.  He is not working so can't assess "work function."  The biggest impact seems to be on  relationships - especially with his wife.  Psychiatric History: Reports he is a recovering alcoholic.  Drank daily about a 1/5th of whiskey.  Became suicidal with a plan and at the last minute, checked himself into [facility] for detox.  He was hospitalized a second time for alcoholism and has stayed clean since.  AA for about 10 years and then started working two jobs and could no longer easily fit it in.  Denies cravings for alcohol.  Around the time he got sober, he was started on Paxil for Depression.  Denies other mental health diagnoses.  Denies other hospitalizations, pharmacotherapy or psychotherapy.    Family history of psychiatric issues: Positive for alcohol on his father's side.  Denies otherwise.    Current and history of substance use: Denies current use of alcohol or drugs other than tobacco.  Was trying to quit smoking but has increased since this event back to about one ppd.  Medical conditions that might explain or contribute to symptoms: Reviewed problem list and medications.  Noted OSA.

## 2013-06-22 ENCOUNTER — Ambulatory Visit (INDEPENDENT_AMBULATORY_CARE_PROVIDER_SITE_OTHER): Payer: BC Managed Care – PPO | Admitting: Psychology

## 2013-06-22 ENCOUNTER — Telehealth: Payer: Self-pay | Admitting: Psychology

## 2013-06-22 DIAGNOSIS — F4310 Post-traumatic stress disorder, unspecified: Secondary | ICD-10-CM

## 2013-06-22 NOTE — Telephone Encounter (Signed)
Erroneous encounter

## 2013-06-22 NOTE — Patient Instructions (Signed)
Please schedule a follow up for:  February 2nd at 10:00.   I think you are experiencing something called PTSD or Post-Traumatic Stress Disorder.  You being fired the way you were was a threat to your sense of self and you know this because of how you have been feeling and the ways your life has changed since you were fired.  We are working on getting you back to feeling better. You said, and I quote:  I want to "find a way or path to get rid of this anger."  The main way to do that is to change the way you think about this situation and specifically, the people involved.  You mentioned forgiveness.  I think ultimately, that is what helps you the most.  It doesn't mean it was right.  It does mean you get to let go of the anger around it and move forward.   You said this is "kind of a toughie" and I couldn't agree more.  You can do hard things.  Consider the way you think about this situation.  Anytime you are feeling really angry, ask yourself, "what's going through my head right now?"  Evaluate the thought.  In our meeting today - you switched the thought:  "I want him to feel my pain" to "I want him to realize the effect he had on my life" and it helped you to feel less angry.  This is your job.

## 2013-06-22 NOTE — Assessment & Plan Note (Signed)
This event threatened his sense of integrity and per his report, he felt helpless (voiceless, powerless).  He described the event as both a "ghost" that "haunts him" and a "thief" that has "taken everything."  Specific symptoms consistent with PTSD include:  - intrusive thoughts and nightmares - markedly diminished interest or participation in significant activities, Feeling of detachment from others, restricted range of affect - difficulty staying asleep, outbursts of anger, difficulty concentrating  This meets full criteria for PTSD and at present, I think this is a better conceptualization of his symptoms than major depression (although his history of this likely complicates his symptoms).  He agreed to meet with me again.  Discussed safety.  He believes harming those involved would make his situation worse.  He also believes that he can get to a better place where those thoughts are no longer present.  Based on these statements, no use of impairing substances and what he reports is a long and clear work record, I assess him to be at low risk for harming others.    He has consulted an attorney who asked him to meet with a mental health professional to get his feelings better managed before proceeding.  He reported to me that he did not want his job back.  He does miss going to work and I think the lack of structure is not healthy.  Next visit will explore relationship with his wife, thoughts on what he wants moving forward and eventually - a close examination of his beliefs around this event and how they are not healthy or helpful.  Scheduled for 1/27 at 10:00.

## 2013-06-22 NOTE — Assessment & Plan Note (Signed)
Using a CBT approach looked at how certain beliefs (e.g. I want the manager to feel the kind of pain that I am feeling.  He needs to hurt like I am hurting) leads to an increase in his angry feelings while other beliefs (e.g. I want the manger to know what kind of impact his actions had on me and how it has affected my life) leads to a significantly lighter feeling.  Mr. Krenek generated the word "forgiveness" without prompting and reached the conclusion that perhaps, it was not so much the situation that was causing him so much distress but what he was doing with it.  This was remarkable movement this meeting and this kind of thinking will be difficult to maintain.  In addition, a possible lawsuit for wrongful termination will likely complicate the picture.  Worked on this though as well examining the different feeling when he shifts his thoughts about the manager (villain vs. Relatively young, inexperienced manager that may have made a mistake).  We will follow in one week to hopefully maintain some momentum.  Mr. Symmes mentioned that his wife is struggling with all of this.  I said he was welcome to bring her in with him.

## 2013-06-22 NOTE — Progress Notes (Signed)
Anthony [name] presented for follow-up.  I reviewed the diagnosis of PTSD and he agreed that it matched his experience.  Discussed what he hopes to gain from attending therapy.  He stated that he wants to "Find a way or path to get rid of this anger."  We examined his narrative for the situation which includes themes like retribution and powerlessness.  Explored in depth.

## 2013-06-28 ENCOUNTER — Encounter: Payer: Self-pay | Admitting: Psychology

## 2013-06-28 ENCOUNTER — Ambulatory Visit (INDEPENDENT_AMBULATORY_CARE_PROVIDER_SITE_OTHER): Payer: BC Managed Care – PPO | Admitting: Psychology

## 2013-06-28 DIAGNOSIS — Z720 Tobacco use: Secondary | ICD-10-CM

## 2013-06-28 DIAGNOSIS — Z87891 Personal history of nicotine dependence: Secondary | ICD-10-CM | POA: Insufficient documentation

## 2013-06-28 DIAGNOSIS — F4310 Post-traumatic stress disorder, unspecified: Secondary | ICD-10-CM

## 2013-06-28 DIAGNOSIS — F17200 Nicotine dependence, unspecified, uncomplicated: Secondary | ICD-10-CM

## 2013-06-28 NOTE — Progress Notes (Signed)
Anthony [name] presented for follow-up.  He reports he is feeling better overall - less angry.  He thinks it will take time to continue to feel better.  Discussed time + effort as a potential way to expedite things.    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  I did not know he had retained this level of function.  He said that he thinks the structure and focus it required was helpful in him not feeling worse than he already did.  I also learned that he has a 19 year old granddaughter (he mentioned a 9 year old grandchild as well) with whom he has a special connection.  [name] has Down's Syndrome.  He thinks that she was instrumental in keeping him going the last few months.    He states he has applied for retirement.  He will not look for another job right now but rather will see how things play out.  He does not think he needs a job in order to keep him busy during the day.  If he were his usual self, he thinks he could manage not working fine.  Of note - the lawyers he consulted are for unemployment; not wrongful termination.  They put him in touch with the EEOC and he plans to pursue.  There is a time limited so he needs to do it soon.

## 2013-06-28 NOTE — Assessment & Plan Note (Addendum)
Modest improvement in thoughts and feelings.  He did not say that this has translated to any behaviors.  When asked what would be different about him if he were his old self, he reported "more pleasant to be around; not as quick-tempered," "less daydreaming" and "up more and piddling around."   Cost is an issue but he would like to be seen soon again.  Scheduled for two weeks.  See patient instructions for further plan.

## 2013-06-28 NOTE — Assessment & Plan Note (Signed)
I did not realize he had two MIs.  He states they were in 2000 and 2001.  He had been trying to cut back smoking when he was fired and was back up to 1 ppd.  I assessed where he was today and he stated that he hadn't realized it, but since he has been feeling better, he has decreased his smoking again.  Goal is to quit; when he cut back before he noticed he felt better.  Will continue to monitor.

## 2013-06-28 NOTE — Patient Instructions (Signed)
Please schedule a follow up for 2/16 at 11:00. I am glad you are feeling better.  In order to keep moving in this direction, you can put forth some effort in specific areas.  Two that have been shown to be helpful including: Scheduling pleasant activities (think of one thing daily that you used to do that brought you some contentment, satisfaction or joy).  Schedule it, commit to it and then do it even if you don't feel like it.  You will almost always feel better for having done it. Daily gratitude.  Look for things to be grateful for and name one out loud (to yourself or someone else) daily.   So glad you are cutting back on smoking again (without even realizing it!).  Given your heart history, you know quitting smoking helps you feel better and is one of the best things you can do for your health.

## 2013-07-12 ENCOUNTER — Ambulatory Visit (INDEPENDENT_AMBULATORY_CARE_PROVIDER_SITE_OTHER): Payer: BC Managed Care – PPO | Admitting: Psychology

## 2013-07-12 DIAGNOSIS — Z7189 Other specified counseling: Secondary | ICD-10-CM

## 2013-07-12 DIAGNOSIS — Z630 Problems in relationship with spouse or partner: Secondary | ICD-10-CM

## 2013-07-12 DIAGNOSIS — F4310 Post-traumatic stress disorder, unspecified: Secondary | ICD-10-CM

## 2013-07-12 NOTE — Progress Notes (Signed)
Anthony [name] presented for follow-up.  He reported that he continues to feel better.  He describes passing moments when he thinks about the situation and feels angry but that the feeling goes away as he is no longer "holding tightly" to the situation.  He did go to the EEOC and they scheduled a meeting with him for next week.  They told him they are unsure whether they would be able to do something but he feels good that at least it was worth a meeting to them.    He noted that it seems once he let go of one thing (his feeling about his firing), it allowed room for something else to come in.  He spent the rest of our time talking about his marital situation.  He would like more intimacy in the relationship (physical and emotional) but has been unable to develop it.  He thinks his wife might be amenable to talking.  Historically, he reports he has not always been a "good guy."  He notes being verbally abusive and "jealous" as well as "quick-tempered."  He denies being physically abusive.  He says his wife has said she has forgiven him but not "forgotten."

## 2013-07-12 NOTE — Assessment & Plan Note (Signed)
Anthony [name] reports this is his biggest area of dissatisfaction currently.  He is not sure what his wife wants from the relationship but is interested to see if what he wants and she wants might line up at all.  He plans to discuss if she would be willing to attend counseling and if so, would she prefer to meet with me or a counselor neither one has seen before.  He will call to follow-up once he knows the answer and her schedule.

## 2013-07-12 NOTE — Assessment & Plan Note (Signed)
Significantly improved in terms of emotions and cognitions.  He is doing more behaviorally as well and anticipates that this will get even better.  He has the energy and motivation to shift focus to another area of his life.

## 2013-07-18 ENCOUNTER — Other Ambulatory Visit: Payer: Self-pay | Admitting: Family Medicine

## 2013-08-01 ENCOUNTER — Other Ambulatory Visit: Payer: Self-pay | Admitting: Family Medicine

## 2013-08-05 ENCOUNTER — Other Ambulatory Visit: Payer: Self-pay | Admitting: Family Medicine

## 2013-08-22 ENCOUNTER — Other Ambulatory Visit: Payer: Self-pay | Admitting: Family Medicine

## 2013-09-06 ENCOUNTER — Other Ambulatory Visit: Payer: Self-pay | Admitting: Family Medicine

## 2013-09-07 ENCOUNTER — Other Ambulatory Visit: Payer: Self-pay | Admitting: Family Medicine

## 2013-09-07 NOTE — Telephone Encounter (Signed)
Rx filled for simvastatin 20 mg qHS #90 refills 3. Clarified with pharmacy; had sent in duplicate orders. Med list updated to show only one Rx. F/u as needed. --CMS

## 2013-10-12 ENCOUNTER — Encounter: Payer: Self-pay | Admitting: Family Medicine

## 2013-10-26 ENCOUNTER — Encounter: Payer: Self-pay | Admitting: Family Medicine

## 2013-10-26 ENCOUNTER — Ambulatory Visit (INDEPENDENT_AMBULATORY_CARE_PROVIDER_SITE_OTHER): Payer: BC Managed Care – PPO | Admitting: Family Medicine

## 2013-10-26 VITALS — BP 105/58 | HR 112 | Temp 98.2°F | Resp 98 | Ht 73.0 in | Wt 214.6 lb

## 2013-10-26 DIAGNOSIS — IMO0002 Reserved for concepts with insufficient information to code with codable children: Secondary | ICD-10-CM

## 2013-10-26 DIAGNOSIS — IMO0001 Reserved for inherently not codable concepts without codable children: Secondary | ICD-10-CM

## 2013-10-26 DIAGNOSIS — I10 Essential (primary) hypertension: Secondary | ICD-10-CM

## 2013-10-26 DIAGNOSIS — E1165 Type 2 diabetes mellitus with hyperglycemia: Secondary | ICD-10-CM

## 2013-10-26 DIAGNOSIS — E7800 Pure hypercholesterolemia, unspecified: Secondary | ICD-10-CM

## 2013-10-26 DIAGNOSIS — Z23 Encounter for immunization: Secondary | ICD-10-CM

## 2013-10-26 DIAGNOSIS — Z0000 Encounter for general adult medical examination without abnormal findings: Secondary | ICD-10-CM

## 2013-10-26 LAB — COMPREHENSIVE METABOLIC PANEL
ALT: 12 U/L (ref 0–53)
AST: 13 U/L (ref 0–37)
Albumin: 3.8 g/dL (ref 3.5–5.2)
Alkaline Phosphatase: 75 U/L (ref 39–117)
BUN: 11 mg/dL (ref 6–23)
CO2: 27 mEq/L (ref 19–32)
Calcium: 9.5 mg/dL (ref 8.4–10.5)
Chloride: 107 mEq/L (ref 96–112)
Creat: 1.03 mg/dL (ref 0.50–1.35)
Glucose, Bld: 63 mg/dL — ABNORMAL LOW (ref 70–99)
Potassium: 4.3 mEq/L (ref 3.5–5.3)
Sodium: 141 mEq/L (ref 135–145)
Total Bilirubin: 0.7 mg/dL (ref 0.2–1.2)
Total Protein: 6.3 g/dL (ref 6.0–8.3)

## 2013-10-26 LAB — POCT GLYCOSYLATED HEMOGLOBIN (HGB A1C): Hemoglobin A1C: 8.6

## 2013-10-26 MED ORDER — ZOSTER VACCINE LIVE 19400 UNT/0.65ML ~~LOC~~ SOLR: 0.6500 mL | Freq: Once | SUBCUTANEOUS | Status: DC

## 2013-10-26 NOTE — Patient Instructions (Signed)
Thank you for coming in, today!  Everything looks okay, today. If you have any more symptoms like feeling dizzy, check your blood pressure at home. Call me if it is lower than 100-110 / 16 or so.  We will give you a couple of shots, today. You can take a prescription for the shingles vaccine to your pharmacy.  Great job on cutting back smoking! Try to quit completely if you can. If you want you can call 1-800-QUIT-NOW completely free for some help stopping, as well.  Schedule an appointment with your cardiologist -- I don't remember seeing your heartbeat be irregular, before. Make sure you schedule a colonoscopy, as well.  We will check some labs, today. We will have to check your cholesterol next time you see me. I will call you or send you a letter with your results.  Come back to see me in about 3 months. Please feel free to call with any questions or concerns at any time, at [phone]. --Dr. Venetia Maxon

## 2013-10-26 NOTE — Progress Notes (Addendum)
Subjective:    Patient ID: Anthony [name], male    DOB: 1950-07-16, 63 y.o.   MRN: [id number]  HPI: Pt presents to clinic for his annual physical exam. No great current complaints. Reports he has eye surgery on 6/11.  1. DM - doing well, reports compliance with insulin (taking 70/30 but doing 60 units qAM and 10 units qPM) - not checking sugars at home but in the last month reports usually 130's-140's in the morning, fasting overnight - admits eating "whatever he feels like"  2. HTN - reports compliance with meds, does not regular check BP at home - currently on amlodipine 10 mg daily, Lasix 40 mg daily, Coreg 25 mg BID, and Cozaar 100 mg daily - does report some "lightheadedness" after "standing up too fast" last week but otherwise denies headache, change in vision, SOB, chest pain  3. Exercise - not walking / exercising regularly, only maybe once per week - reports not particularly working on weight, at the moment, with diet as above  4. PTSD-like symptoms - seeing Dr. Gwenlyn Saran, on Paxil for chronic depression, and "slowly doing better" - recently lost job (see recent progress notes / telephone notes), but now working part-time at night - overall denies needs, though is very anxious still and "hoping things will work out"  Family History  Problem Relation Age of Onset  . Colon cancer Neg Hx   . Stomach cancer Neg Hx     Past Surgical History  Procedure Laterality Date  . Angioplasty  2001    stent x 1  . Toe amputation Left 2012  . Colonoscopy  2000?    negative    History   Social History  . Marital Status: Married    Spouse Name: [name]    Number of Children: 2  . Years of Education: GED   Occupational History  .    [employer]     [employer]   Social History Main Topics  . Smoking status: Current Some Day Smoker -- 0.30 packs/day    Types: Cigarettes    Start date: 05/28/1967    Last Attempt to Quit: 03/04/2010  . Smokeless tobacco: Never Used   Comment: 1 pk per day  . Alcohol Use: No     Comment: quit in 1993  . Drug Use: No  . Sexual Activity: Not on file   Other Topics Concern  . Not on file   Social History Narrative   Patient is right handed, consumes caffeine rarely. Patient resides in home with wife.    Pt is a current smoker. He has cut back to about 8 cigarettes per day. He wants to quit completely but is uninterested in meds / pharmacy clinic at this time. In addition to the above documentation, pt's PMH, surgical history, FH, and SH all reviewed and updated where appropriate in the EMR. I have also reviewed and updated the pt's allergies and current medications as appropriate.  Review of Systems: As above. Otherwise, full 12-system ROS was reviewed and all negative.     Objective:   Physical Exam BP 105/58  Pulse 112  Temp(Src) 98.2 F (36.8 C) (Oral)  Resp 98  Ht 6\' 1"  (1.854 m)  Wt 214 lb 9.6 oz (97.342 kg)  BMI 28.32 kg/m2 Gen: well-appearing male adult, elderly, in NAD HEENT: [place]/AT, sclerae/conjunctivae clear, no lid lag, EOMI, PERRLA, TM's clear bilaterally  MMM, posterior oropharynx clear, no cervical lymphadenopathy  neck supple with full ROM, no masses appreciated;  thyroid not enlarged  Cardio: , no murmur appreciated; distal pulses intact/symmetric  ADDENDUM: correction to previous note; rate irregularly irregular, manual check of pulse 80 Pulm: CTAB, no wheezes, normal WOB  Abd: soft, nondistended, BS+, no HSM Ext: warm/well-perfused, no cyanosis/clubbing/edema MSK: strength 5/5 in all four extremities, no frank joint deformity/effusion  normal ROM to all four extremities  S/p amputation of left second toe Neuro/Psych: alert/oriented, sensation grossly intact; normal gait/balance  mood reported euthymic with congruent affect; generally better mood / affect than previous visits Diabetic foot exam: see EPIC flow-sheet  Grossly without skin breakdown / ulcerations  S/p left second toe  amputation; toenails diffusely thickened / yellow  Grossly diminished sensation to light touch and monofilament, worst in toes / forefoot bilaterally     Assessment & Plan:  63yo male, doing well.  DM with poor control in the past, but generally no new complaints or issues. HTN well-controlled, though may need to back off meds if hypotension is occurring -- instructed to monitor at home. PTSD-like symptoms / depression - doing well with Paxil, improving with counseling -- will follow up with Dr. Gwenlyn Saran as needed Tobacco abuse - cutting back on his own, doing well -- praised progress and counseled on 1-800-QUIT-NOW, consider meds / other assistance in the future Addendum: irregularly irregular pulse - reported Hx of Afib, rate controlled -- advised f/u with cardiology; no symptoms to suggest need for EKG or further study at this time  Anticipatory guidance  - discussed maintenance of healthy weight, and suggested more regular exercise than one day per week - counseled on tobacco cessation as above; will consider Wellbutrin or other agent, in the future  Risk factor reduction - counseled on importance of improving diabetes management; overall needs to work on diet, the most - discussed maintaining good blood pressure control to reduce adverse CVD event risk  Immunization / screening / ancillary studies - due for colon cancer screening - provided information for scheduling - Tdap and pneumococcal vaccines given today; printed Rx for Zoster vaccine also given - basic labs drawn today for liver and kidney function; needs lipid panel at next f/u (too soon for insurance to pay, today) - A1c also drawn today (8.6, improved from 10, at last check)  Follow-up in 3 months for diabetes, HTN, and HLD follow-up / med management.  Emmaline Kluver, MD PGY-2, [facility] 10/26/2013, 5:20 PM

## 2013-10-27 ENCOUNTER — Encounter: Payer: Self-pay | Admitting: Family Medicine

## 2013-11-04 ENCOUNTER — Encounter (HOSPITAL_COMMUNITY): Payer: Self-pay | Admitting: Emergency Medicine

## 2013-11-04 ENCOUNTER — Telehealth: Payer: Self-pay | Admitting: Cardiology

## 2013-11-04 ENCOUNTER — Emergency Department (HOSPITAL_COMMUNITY)
Admission: EM | Admit: 2013-11-04 | Discharge: 2013-11-04 | Disposition: A | Discharge status: Home / Self Care | Payer: BC Managed Care – PPO | Attending: Emergency Medicine | Admitting: Emergency Medicine

## 2013-11-04 DIAGNOSIS — Z9861 Coronary angioplasty status: Secondary | ICD-10-CM | POA: Insufficient documentation

## 2013-11-04 DIAGNOSIS — F3289 Other specified depressive episodes: Secondary | ICD-10-CM | POA: Insufficient documentation

## 2013-11-04 DIAGNOSIS — F329 Major depressive disorder, single episode, unspecified: Secondary | ICD-10-CM | POA: Insufficient documentation

## 2013-11-04 DIAGNOSIS — Z87448 Personal history of other diseases of urinary system: Secondary | ICD-10-CM | POA: Insufficient documentation

## 2013-11-04 DIAGNOSIS — I10 Essential (primary) hypertension: Secondary | ICD-10-CM | POA: Insufficient documentation

## 2013-11-04 DIAGNOSIS — I509 Heart failure, unspecified: Secondary | ICD-10-CM | POA: Insufficient documentation

## 2013-11-04 DIAGNOSIS — Z794 Long term (current) use of insulin: Secondary | ICD-10-CM | POA: Insufficient documentation

## 2013-11-04 DIAGNOSIS — Z8669 Personal history of other diseases of the nervous system and sense organs: Secondary | ICD-10-CM | POA: Insufficient documentation

## 2013-11-04 DIAGNOSIS — Z8619 Personal history of other infectious and parasitic diseases: Secondary | ICD-10-CM | POA: Insufficient documentation

## 2013-11-04 DIAGNOSIS — Z7982 Long term (current) use of aspirin: Secondary | ICD-10-CM | POA: Insufficient documentation

## 2013-11-04 DIAGNOSIS — M129 Arthropathy, unspecified: Secondary | ICD-10-CM | POA: Insufficient documentation

## 2013-11-04 DIAGNOSIS — I252 Old myocardial infarction: Secondary | ICD-10-CM | POA: Insufficient documentation

## 2013-11-04 DIAGNOSIS — Z8719 Personal history of other diseases of the digestive system: Secondary | ICD-10-CM | POA: Insufficient documentation

## 2013-11-04 DIAGNOSIS — E785 Hyperlipidemia, unspecified: Secondary | ICD-10-CM | POA: Insufficient documentation

## 2013-11-04 DIAGNOSIS — I4891 Unspecified atrial fibrillation: Secondary | ICD-10-CM | POA: Insufficient documentation

## 2013-11-04 DIAGNOSIS — F17200 Nicotine dependence, unspecified, uncomplicated: Secondary | ICD-10-CM | POA: Insufficient documentation

## 2013-11-04 DIAGNOSIS — R000 Tachycardia, unspecified: Secondary | ICD-10-CM | POA: Insufficient documentation

## 2013-11-04 DIAGNOSIS — Z79899 Other long term (current) drug therapy: Secondary | ICD-10-CM | POA: Insufficient documentation

## 2013-11-04 DIAGNOSIS — E119 Type 2 diabetes mellitus without complications: Secondary | ICD-10-CM | POA: Insufficient documentation

## 2013-11-04 LAB — I-STAT CHEM 8, ED
BUN: 7 mg/dL (ref 6–23)
Calcium, Ion: 1.22 mmol/L (ref 1.13–1.30)
Chloride: 103 mEq/L (ref 96–112)
Creatinine, Ser: 0.9 mg/dL (ref 0.50–1.35)
Glucose, Bld: 266 mg/dL — ABNORMAL HIGH (ref 70–99)
HCT: 41 % (ref 39.0–52.0)
Hemoglobin: 13.9 g/dL (ref 13.0–17.0)
Potassium: 4.1 mEq/L (ref 3.7–5.3)
Sodium: 140 mEq/L (ref 137–147)
TCO2: 21 mmol/L (ref 0–100)

## 2013-11-04 LAB — I-STAT TROPONIN, ED: Troponin i, poc: 0.01 ng/mL (ref 0.00–0.08)

## 2013-11-04 NOTE — ED Notes (Signed)
Pt had left eye cataract surgery and was sent here for asymptomatic rapid afib.  Denies pain, sob, or dizziness.  Pt states has history of irregular heart beat.

## 2013-11-04 NOTE — ED Notes (Signed)
Anthony [name] [id number]  To be called  When patient get discharged.

## 2013-11-04 NOTE — Telephone Encounter (Signed)
This pt is a former Dr Verl Blalock pt and now is Dr Francesca Oman pt.  [place] office calling to make Dr Meda Coffee and nurse aware that pt just had cataract surgery done this morning.  V/S before surgery were stable at 128/80, HR 105,R-20,T- 98.8.  Nurse Ardeen Jourdain states now that pt is in their PACU unit, he has become symptomatic with rapid Afib at 140's-160s, BP- 148/100.  Pts BS - is 268 as well.  Nurse Clarene Critchley states pt is not in acute distress, but does have some sob.  Nurse wants to know if pt can be seen by Dr Meda Coffee this week or on Monday.  Advised pts nurse, that given his symptoms, they would need to have this pt transported to the ED at [facility] for further cardiac work-up.  Informed nurse that I will notify our card master Trish that pt will be coming via EMS to the ED.  Informed nurse that Dr Meda Coffee is out of the office until next week, but I will certainly inform her by sending her a message.  Nurse Clarene Critchley verbalized understanding and agrees with this plan.

## 2013-11-04 NOTE — ED Provider Notes (Addendum)
I saw and evaluated the patient, reviewed the resident's note and I agree with the findings and plan.   EKG Interpretation   Date/Time:  Thursday November 04 2013 09:40:13 EDT Ventricular Rate:  110 PR Interval:    QRS Duration: 78 QT Interval:  328 QTC Calculation: 443 R Axis:   -28 Text Interpretation:  Atrial fibrillation Septal infarct , age  undetermined Abnormal ECG Confirmed by Syble Picco,  DO, Tonetta Napoles ([id number]) on  11/04/2013 10:06:04 AM      Pt is a 63 y.o. M with history of afebrile not on anticoagulation who is rate controlled with carvedilol, CHF, hypertension, hyperlipidemia, diabetes who presents emergency Department with rapid A. fib that was found in his ophthalmologist office when he was having left side cataract surgery today. His heart rate in the air was in the 140s but he was asymptomatic. He was instructed after surgery to come to the ED. Patient denies any chest pain or chest discomfort, shortness of breath, dizziness, diaphoresis, palpitations. He took his dose of carvedilol this morning. In the ED, patient heart rate is never greater than 120. His heart and lung sounds are normal. He is pleasant, well-appearing, nontoxic. Denies any recent fevers, cough, vomiting or diarrhea, placed or melena. Basic labs are unremarkable other than mild hyperglycemia. We'll discuss with his cardiologist to see if we should increase his dose of carvedilol but anticipate discharge home. Patient and daughter at bedside are comfortable with this plan.    [place], DO 11/04/13 [place] Chrysten Woulfe, DO 11/04/13 [place] Rui Wordell, DO 11/04/13 1219

## 2013-11-04 NOTE — ED Notes (Signed)
MD at bedside. Resident

## 2013-11-04 NOTE — ED Provider Notes (Signed)
CSN: [id number]     Arrival date & time 11/04/13  0934 History   First MD Initiated Contact with Patient 11/04/13 [phone]     Chief Complaint  Patient presents with  . Irregular Heart Beat     (Consider location/radiation/quality/duration/timing/severity/associated sxs/prior Treatment) Patient is a 63 y.o. male presenting with general illness.  Illness Location:  Heart Quality:  Irregular Severity:  Mild Duration: unknown. Timing:  Constant Progression:  Unchanged Context:  After conscious sedation Associated symptoms: no abdominal pain, no chest pain, no congestion, no cough, no diarrhea, no fever, no headaches, no rhinorrhea, no shortness of breath and no vomiting    Patient receiving conscious sedation, found to have afib and slight htn afterwards, no symptoms. Patient states it's not new. Sent to ED at [facility] of cardiology office. No chest pain, sob, headache, blurry vision or swollen feet.   Past Medical History  Diagnosis Date  . Diabetes mellitus   . Arthritis   . CHF (congestive heart failure)   . Hypertension   . Hyperlipidemia   . Depression   . Erectile dysfunction   . Onychomycosis   . GERD (gastroesophageal reflux disease)   . Edema of extremities   . Myocardial infarction 2000&2001  . Obstructive apnea   . Unspecified sleep apnea 02/22/2013   Past Surgical History  Procedure Laterality Date  . Angioplasty  2001    stent x 1  . Toe amputation Left 2012  . Colonoscopy  2000?    negative   Family History  Problem Relation Age of Onset  . Colon cancer Neg Hx   . Stomach cancer Neg Hx    History  Substance Use Topics  . Smoking status: Current Some Day Smoker -- 0.30 packs/day    Types: Cigarettes    Start date: 05/28/1967    Last Attempt to Quit: 03/04/2010  . Smokeless tobacco: Never Used     Comment: 1 pk per day  . Alcohol Use: No     Comment: quit in 1993    Review of Systems  Constitutional: Negative for fever and chills.  HENT: Negative  for congestion and rhinorrhea.   Eyes: Negative for pain.  Respiratory: Negative for cough and shortness of breath.   Cardiovascular: Negative for chest pain and palpitations.  Gastrointestinal: Negative for vomiting, abdominal pain, diarrhea and constipation.  Endocrine: Negative for polydipsia and polyuria.  Genitourinary: Negative for dysuria and flank pain.  Musculoskeletal: Negative for back pain and neck pain.  Skin: Negative for color change and wound.  Neurological: Negative for dizziness, numbness and headaches.      Allergies  Enalapril maleate  Home Medications   Prior to Admission medications   Medication Sig Start Date End Date Taking? Authorizing Provider  amLODipine (NORVASC) 10 MG tablet Take 10 mg by mouth daily.   Yes Historical Provider, MD  aspirin 81 MG EC tablet Take 1 tablet (81 mg total) by mouth daily. 02/28/11  Yes Gregor Hams, MD  carvedilol (COREG) 25 MG tablet Take 25 mg by mouth 2 (two) times daily with a meal.   Yes Historical Provider, MD  furosemide (LASIX) 40 MG tablet Take 1 tablet (40 mg total) by mouth daily.   Yes Sharon Mt Street, MD  insulin NPH-regular (NOVOLIN 70/30) (70-30) 100 UNIT/ML injection Inject 60 Units into the skin 2 (two) times daily. 02/04/13  Yes Sharon Mt Street, MD  isosorbide mononitrate (IMDUR) 60 MG 24 hr tablet Take 60 mg by mouth daily.   Yes  Historical Provider, MD  losartan (COZAAR) 100 MG tablet Take 100 mg by mouth daily.   Yes Historical Provider, MD  metFORMIN (GLUCOPHAGE) 1000 MG tablet Take 1 tablet (1,000 mg total) by mouth 2 (two) times daily with a meal.   Yes Sharon Mt Street, MD  Multiple Vitamin (MULTIVITAMIN) tablet Take 1 tablet by mouth daily.     Yes Historical Provider, MD  Omega-3 Fatty Acids (FISH OIL) 1000 MG CAPS Take 1 capsule by mouth daily.     Yes Historical Provider, MD  oxymorphone (OPANA) 5 MG tablet Take 5 mg by mouth every 4 (four) hours as needed for pain.   Yes Historical  Provider, MD  PARoxetine (PAXIL) 40 MG tablet Take 40 mg by mouth every morning.   Yes Historical Provider, MD  potassium chloride (K-DUR,KLOR-CON) 10 MEQ tablet Take 20 mEq by mouth daily.   Yes Historical Provider, MD  simvastatin (ZOCOR) 20 MG tablet Take 1 tablet (20 mg total) by mouth at bedtime. 09/07/13  Yes Sharon Mt Street, MD  nitroGLYCERIN (NITROSTAT) 0.4 MG SL tablet Place 0.4 mg under the tongue every 5 (five) minutes as needed. If no relief by 3rd tab, call 911     Historical Provider, MD  zoster vaccine live, PF, (ZOSTAVAX) 36644 UNT/0.65ML injection Inject 19,400 Units into the skin once. 10/26/13   Sharon Mt Street, MD   BP 125/83  Pulse 88  Temp(Src) 97.3 F (36.3 C) (Oral)  Resp 18  SpO2 98% Physical Exam  Nursing note and vitals reviewed. Constitutional: He is oriented to person, place, and time. He appears well-developed and well-nourished.  HENT:  Head: Normocephalic and atraumatic.  Eyes: Conjunctivae and EOM are normal. Pupils are equal, round, and reactive to light.  Patch/guard over left eye  Neck: Normal range of motion.  Cardiovascular: An irregularly irregular rhythm present. Tachycardia present.   Pulmonary/Chest: Effort normal and breath sounds normal.  Abdominal: Soft. He exhibits no distension. There is no tenderness.  Musculoskeletal: Normal range of motion. He exhibits no edema and no tenderness.  Neurological: He is alert and oriented to person, place, and time.  Skin: Skin is warm and dry.    ED Course  Procedures (including critical care time) Labs Review Labs Reviewed  I-STAT CHEM 8, ED - Abnormal; Notable for the following:    Glucose, Bld 266 (*)    All other components within normal limits  I-STAT TROPOININ, ED    Imaging Review No results found.   EKG Interpretation   Date/Time:  Thursday November 04 2013 09:40:13 EDT Ventricular Rate:  110 PR Interval:    QRS Duration: 78 QT Interval:  328 QTC Calculation: 443 R Axis:    -28 Text Interpretation:  Atrial fibrillation Septal infarct , age  undetermined Abnormal ECG Confirmed by WARD,  DO, KRISTEN [id number]) on  11/04/2013 10:06:04 AM      MDM   Final diagnoses:  Atrial fibrillation    63 yo M w/ h/o CAD, DM, CHF, HLD, HTN, ED presents from sedation (for cataract) 2/2 HR in 140's and hypertension with reported dyspnea (per nurse, patient state he had none) asymptomatic now. HR in 90-100's, not tachypneic. BP normal. Lungs clear, heart normal. No afib w/ RVR, asymptomatic. No symptoms recently. Likely anesthesia/anxiety reaction, will observe. Consider speaking with family practice about increasing carvedilol.  Labs checked to ensure no end organ damage and normal. Cardiology spoken with and set up an appointment this week to see someone for management. Patient still stable  and d/c home.     Merrily Pew, MD 11/04/13 [phone]

## 2013-11-04 NOTE — Telephone Encounter (Signed)
New message    Office calling patient had eye surgery today .   C/O increase heart rate 141 . Blood pressure 148/101 , blood sugar 268 .

## 2013-11-09 ENCOUNTER — Ambulatory Visit (INDEPENDENT_AMBULATORY_CARE_PROVIDER_SITE_OTHER): Payer: BC Managed Care – PPO | Admitting: Cardiology

## 2013-11-09 ENCOUNTER — Encounter: Payer: Self-pay | Admitting: Cardiology

## 2013-11-09 VITALS — BP 108/62 | HR 118 | Ht 73.0 in | Wt 209.4 lb

## 2013-11-09 DIAGNOSIS — Z7901 Long term (current) use of anticoagulants: Secondary | ICD-10-CM

## 2013-11-09 DIAGNOSIS — I2510 Atherosclerotic heart disease of native coronary artery without angina pectoris: Secondary | ICD-10-CM

## 2013-11-09 DIAGNOSIS — IMO0001 Reserved for inherently not codable concepts without codable children: Secondary | ICD-10-CM

## 2013-11-09 DIAGNOSIS — I482 Chronic atrial fibrillation, unspecified: Secondary | ICD-10-CM | POA: Insufficient documentation

## 2013-11-09 DIAGNOSIS — E1165 Type 2 diabetes mellitus with hyperglycemia: Secondary | ICD-10-CM

## 2013-11-09 DIAGNOSIS — H269 Unspecified cataract: Secondary | ICD-10-CM

## 2013-11-09 DIAGNOSIS — Z8679 Personal history of other diseases of the circulatory system: Secondary | ICD-10-CM | POA: Insufficient documentation

## 2013-11-09 DIAGNOSIS — I4891 Unspecified atrial fibrillation: Secondary | ICD-10-CM

## 2013-11-09 DIAGNOSIS — IMO0002 Reserved for concepts with insufficient information to code with codable children: Secondary | ICD-10-CM

## 2013-11-09 MED ORDER — APIXABAN 5 MG PO TABS: 5.0000 mg | ORAL_TABLET | Freq: Two times a day (BID) | ORAL | Status: DC

## 2013-11-09 MED ORDER — DIGOXIN 250 MCG PO TABS: 0.2500 mg | ORAL_TABLET | Freq: Every day | ORAL | Status: DC

## 2013-11-09 NOTE — Assessment & Plan Note (Signed)
Surgery is planned for the 18th I discussed with Dr. Ellyn Hack we will hold starting his anticoagulation until the evening of the 18th and again at that time otherwise the patient is cleared for cataract surgery.

## 2013-11-09 NOTE — Assessment & Plan Note (Signed)
Uncontrolled at times, insulin dependent

## 2013-11-09 NOTE — Patient Instructions (Addendum)
Your physician has requested that you have a lexiscan myoview. For further information please visit [email or web address]. Please follow instruction sheet, as given. ** next week **  NEW MEDICATIONS 1. Start Eliquis 5mg  twice daily ON Thursday 6/18 2. Start Digoxin 0.25mg    - today you will take 2 doses    (take 1 dose when you get the medication and take another dose 6 hours later).   - starting tomorrow (Wednesday June 17) you will take once daily.   Please schedule an appointment with Erasmo Downer (pharmacist) to discuss Eliquis TOMORROW (Wednesday June 17) You will also get another EKG tomorrow to check your heart rate  Follow up with Dr. Meda Coffee in 3 weeks ([place]).

## 2013-11-09 NOTE — Assessment & Plan Note (Addendum)
Found to be in A. fib with RVR when he went to have his cataracts done last Thursday, June 11. Since the emergency room his rate slowed somewhat. He is here today for followup. Unknown length of time patient has been in A. Fib as far back as 2 weeks ago he would have episodes, while mowing the grass, of some lightheadedness or dizziness and shortness of breath.  His Chads2Vasc2 score is 4.  After discussion with Dr. Ellyn Hack we will add Lanoxin 0.25 mg to medications. His blood pressure is borderline which makes it difficult to adjust his medications.  If his heart rate is not controlled with Lanoxin I would stop the amlodipine and change to Cardizem.  He will need a LexiScan Myoview to evaluate for ischemia as cause of a fib.   He will be back tomorrow to have an EKG when he sees [place] to see if his rate has slowed with Lanoxin.  He'll followup next week with Dr. Meda Coffee after the stress test.  We would like to get a 2-D echo but would like his heart rate slowed prior to that.

## 2013-11-09 NOTE — Assessment & Plan Note (Signed)
Currently euvolemic, history though of heart failure presumed to be diastolic.

## 2013-11-09 NOTE — Assessment & Plan Note (Signed)
2003 with stent to the circumflex stenosis extending into the second obtuse marginal branch with DES.  Last nuclear stress test 2007 negative for ischemia

## 2013-11-09 NOTE — Assessment & Plan Note (Signed)
We will begin Eliquis 5 mg BID to begin on June 18th in the evening. Once he begins the Eliquis he can stop his aspirin.  He will meet with Cyril Mourning tomorrow to discuss anticoagulation.

## 2013-11-09 NOTE — Progress Notes (Signed)
11/09/2013   PCP: Christa See, MD   Chief Complaint  Patient presents with  . Follow-up    ED on 6/11 for irregular heart beat; denies CP/SOB/lightheadedness/dizziness    Primary Cardiologist: Dr. Verl Blalock now Dr. Meda Coffee  HPI:  63 year-old male with previous history of acute MI and in 2001 , congestive heart failure may be diastolic, coronary artery disease ( 2001 cardiac cath revealed nonobstructive coronary disease with an EF of 60%).  In 2003 patient again had chest pain underwent cardiac cath with successful stenting of the circumflex lesion extending into the second obtuse marginal branch with a drug-eluting stent.  Myoview 2007 with no ischemia and EF of 59%.   Pt has with him an EKG from 2001 that also has a fib.  All other EKGs in the computer are with SR.    Pt was at opthamologist on Thursday the 11th of June and he was found to be in A. fib with RVR.  The office was called and it our office instructed the patient to go to the emergency room. In the initial EKG heart rate was 141.  In the emergency room his rate was improved control 110 though A. fib continued. His troponin was negative the ER note states patient will followup in the office.  He presents today with no chest pain he states for the last 2 weeks he's had episodes where he felt lightheaded or dizzy while mowing the grass and maybe some mild shortness of breath.  He continues in A. fib with a heart rate of 118. His Chads2Vas2  score is 4.   Allergies  Allergen Reactions  . Enalapril Maleate Cough    REACTION: cough    Current Outpatient Prescriptions  Medication Sig Dispense Refill  . amLODipine (NORVASC) 10 MG tablet Take 10 mg by mouth daily.      Marland Kitchen aspirin 81 MG EC tablet Take 1 tablet (81 mg total) by mouth daily.  30 tablet  12  . carvedilol (COREG) 25 MG tablet Take 25 mg by mouth 2 (two) times daily with a meal.      . furosemide (LASIX) 40 MG tablet Take 1 tablet (40 mg total) by  mouth daily.  90 tablet  1  . insulin NPH-regular Human (NOVOLIN 70/30) (70-30) 100 UNIT/ML injection Inject 30-60 Units into the skin 2 (two) times daily. 60 Units AM 30 Units PM      . isosorbide mononitrate (IMDUR) 60 MG 24 hr tablet Take 60 mg by mouth daily.      Marland Kitchen losartan (COZAAR) 100 MG tablet Take 100 mg by mouth daily.      . metFORMIN (GLUCOPHAGE) 1000 MG tablet Take 1 tablet (1,000 mg total) by mouth 2 (two) times daily with a meal.  180 tablet  1  . Multiple Vitamin (MULTIVITAMIN) tablet Take 1 tablet by mouth daily.        . nitroGLYCERIN (NITROSTAT) 0.4 MG SL tablet Place 0.4 mg under the tongue every 5 (five) minutes as needed. If no relief by 3rd tab, call 911       . Omega 3 1000 MG CAPS Take 3 capsules by mouth daily.      Marland Kitchen oxymorphone (OPANA) 5 MG tablet Take 5-10 mg by mouth every 4 (four) hours as needed for pain (max 5 tablets daily).       Marland Kitchen PARoxetine (PAXIL) 40 MG tablet Take 40 mg by mouth every morning.      Marland Kitchen  potassium chloride (K-DUR,KLOR-CON) 10 MEQ tablet Take 20 mEq by mouth daily.      . ranitidine (ZANTAC) 150 MG tablet Take 150 mg by mouth daily.      . simvastatin (ZOCOR) 20 MG tablet Take 1 tablet (20 mg total) by mouth at bedtime.  90 tablet  3  . apixaban (ELIQUIS) 5 MG TABS tablet Take 1 tablet (5 mg total) by mouth 2 (two) times daily.  56 tablet  0  . digoxin (LANOXIN) 0.25 MG tablet Take 1 tablet (0.25 mg total) by mouth daily.  90 tablet  3   No current facility-administered medications for this visit.    Past Medical History  Diagnosis Date  . Diabetes mellitus   . Arthritis   . CHF (congestive heart failure)   . Hypertension   . Hyperlipidemia   . Depression   . Erectile dysfunction   . Onychomycosis   . GERD (gastroesophageal reflux disease)   . Edema of extremities   . Myocardial infarction 2000&2001  . Obstructive apnea   . Unspecified sleep apnea 02/22/2013    Past Surgical History  Procedure Laterality Date  . Angioplasty   2001    stent x 1  . Toe amputation Left 2012  . Colonoscopy  2000?    negative    [id number] colds or fevers, no weight changes Skin:no rashes or ulcers HEENT:no blurred vision, no congestion CV:see HPI PUL:see HPI GI:no diarrhea constipation or melena, no indigestion GU:no hematuria, no dysuria MS:no joint pain, no claudication Neuro:no syncope,+ lightheadedness Endo:+ diabetes has been stable., no thyroid disease  Wt Readings from Last 3 Encounters:  11/09/13 209 lb 6.4 oz (94.983 kg)  10/26/13 214 lb 9.6 oz (97.342 kg)  06/08/13 207 lb 6.4 oz (94.076 kg)    PHYSICAL EXAM BP 108/62  Pulse 118  Ht 6\' 1"  (1.854 m)  Wt 209 lb 6.4 oz (94.983 kg)  BMI 27.63 kg/m2 General:Pleasant affect, NAD Skin:Warm and dry, brisk capillary refill HEENT:normocephalic, sclera clear, mucus membranes moist Neck:supple, no JVD, no bruits , no adenopathy Heart:S1S2 irreg irreg rapid without murmur, gallup, rub or click Lungs:clear without rales, rhonchi, or wheezes [id number], non tender, + BS, do not palpate liver spleen or masses Ext:no lower ext edema, 2+ pedal pulses, 2+ radial pulses Neuro:alert and oriented, MAE, follows commands, + facial symmetry  EKG:A fib with RVR at 118  Septal infarct similar to EKG 11/04/13 when seen in ER.  ASSESSMENT AND PLAN Atrial fibrillation with RVR Found to be in A. fib with RVR when he went to have his cataracts done last Thursday, June 11. Since the emergency room his rate slowed somewhat. He is here today for followup. Unknown length of time patient has been in A. Fib as far back as 2 weeks ago he would have episodes, while mowing the grass, of some lightheadedness or dizziness and shortness of breath.  His Chads2Vasc2 score is 4.  After discussion with Dr. Ellyn Hack we will add Lanoxin 0.25 mg to medications. His blood pressure is borderline which makes it difficult to adjust his medications.  If his heart rate is not controlled with Lanoxin I would stop  the amlodipine and change to Cardizem.  He will need a LexiScan Myoview to evaluate for ischemia as cause of a fib.   He will be back tomorrow to have an EKG when he sees [place] to see if his rate has slowed with Lanoxin.  He'll followup next week with Dr. Meda Coffee after the stress test.  We would like to get a 2-D echo but would like his heart rate slowed prior to that.  CORONARY, ARTERIOSCLEROSIS 2003 with stent to the circumflex stenosis extending into the second obtuse marginal branch with DES.  Last nuclear stress test 2007 negative for ischemia  DM (diabetes mellitus), type 2, uncontrolled Uncontrolled at times, insulin dependent  Cataract Surgery is planned for the 18th I discussed with Dr. Ellyn Hack we will hold starting his anticoagulation until the evening of the 18th and again at that time otherwise the patient is cleared for cataract surgery.  Anticoagulation adequate We will begin Eliquis 5 mg BID to begin on June 18th in the evening. Once he begins the Eliquis he can stop his aspirin.  He will meet with Cyril Mourning tomorrow to discuss anticoagulation.  H/O CHF Currently euvolemic, history though of heart failure presumed to be diastolic.

## 2013-11-10 ENCOUNTER — Encounter: Payer: Self-pay | Admitting: *Deleted

## 2013-11-10 ENCOUNTER — Ambulatory Visit (INDEPENDENT_AMBULATORY_CARE_PROVIDER_SITE_OTHER): Payer: BC Managed Care – PPO | Admitting: Pharmacist Clinician (PhC)/ Clinical Pharmacy Specialist

## 2013-11-10 ENCOUNTER — Ambulatory Visit (INDEPENDENT_AMBULATORY_CARE_PROVIDER_SITE_OTHER): Payer: BC Managed Care – PPO | Admitting: *Deleted

## 2013-11-10 VITALS — BP 110/52 | HR 99

## 2013-11-10 DIAGNOSIS — I4891 Unspecified atrial fibrillation: Secondary | ICD-10-CM

## 2013-11-10 NOTE — Progress Notes (Signed)
EKG reviewed by Dr. Glenetta Hew (DOD) in office. Dr. Ellyn Hack states OK for surgery based on EKG readings from today. Patient is consulting with pharmacist, Tommy Medal, about Eliquis and when to begin medication, after eye operation.   See Mickel Baas, NP note regarding clearance and STOPPING aspirin once Eliquis is started.  "otherwise patient is cleared for cataract surgery"

## 2013-11-10 NOTE — Patient Instructions (Addendum)
-   Continue medications as prescribed.   - Start Eliquis after eye procedure (evening of Thursday June 18th)   - take this medication twice daily or about every 12 hours  - once you start Eliquis, Mickel Baas, NP stated you can STOP aspirin  - Follow up with Dr. Meda Coffee (~August) - [place]. (after your stress test)

## 2013-11-11 NOTE — Progress Notes (Signed)
Pt was started on Eliquis for atrial fibrillation on June 16,2015 .    Reviewed patients medication list.  Pt is not currently on any combined P-gp and strong CYP3A4 inhibitors/inducers (ketoconazole, traconazole, ritonavir, carbamazepine, phenytoin, rifampin, St. John's wort).  Reviewed labs.  SCr 1.03, Weight 209.4, CrCl- 99.  Dose appropriate based on CrCl.   Hgb and HCT Within Normal Limits  A full discussion of the nature of anticoagulants has been carried out.  A benefit/risk analysis has been presented to the patient, so that they understand the justification for choosing anticoagulation with Eliquis at this time.  The need for compliance is stressed.  Pt is aware to take the medication twice daily.  Side effects of potential bleeding are discussed, including unusual colored urine or stools, coughing up blood or coffee ground emesis, nose bleeds or serious fall or head trauma.  Discussed signs and symptoms of stroke. The patient should avoid any OTC items containing aspirin or ibuprofen.  Avoid alcohol consumption.   Call if any signs of abnormal bleeding.  Discussed financial obligations and resolved any difficulty in obtaining medication.  Next lab test test in 6 months.

## 2013-11-16 ENCOUNTER — Telehealth (HOSPITAL_COMMUNITY): Payer: Self-pay

## 2013-11-16 ENCOUNTER — Encounter: Payer: Self-pay | Admitting: Pharmacist Clinician (PhC)/ Clinical Pharmacy Specialist

## 2013-11-18 ENCOUNTER — Encounter: Payer: Self-pay | Admitting: Internal Medicine

## 2013-11-18 ENCOUNTER — Ambulatory Visit (HOSPITAL_COMMUNITY)
Admission: RE | Admit: 2013-11-18 | Discharge: 2013-11-18 | Disposition: A | Discharge status: Home / Self Care | Payer: BC Managed Care – PPO | Source: Ambulatory Visit | Attending: Cardiology | Admitting: Cardiology

## 2013-11-18 ENCOUNTER — Ambulatory Visit (INDEPENDENT_AMBULATORY_CARE_PROVIDER_SITE_OTHER): Payer: BC Managed Care – PPO | Admitting: Internal Medicine

## 2013-11-18 DIAGNOSIS — E10649 Type 1 diabetes mellitus with hypoglycemia without coma: Secondary | ICD-10-CM | POA: Insufficient documentation

## 2013-11-18 DIAGNOSIS — E162 Hypoglycemia, unspecified: Secondary | ICD-10-CM

## 2013-11-18 DIAGNOSIS — I4891 Unspecified atrial fibrillation: Secondary | ICD-10-CM

## 2013-11-18 DIAGNOSIS — E1069 Type 1 diabetes mellitus with other specified complication: Secondary | ICD-10-CM

## 2013-11-18 MED ORDER — GLUCOSE 40 % PO GEL
1.0000 | Freq: Once | ORAL | Status: AC
  Administered 2013-11-18: 37.5 g via ORAL

## 2013-11-18 MED ORDER — DEXTROSE 50 % IV SOLN
25.0000 g | Freq: Once | INTRAVENOUS | Status: AC
  Administered 2013-11-18: 25 g via INTRAVENOUS

## 2013-11-18 NOTE — Progress Notes (Signed)
Anthony [name] was seen during his stress test in the office. I was called to the bedside as he was found to be acting confused and disoriented. He was profusely diaphoretic. EKG showed no acute changes and his blood pressure was elevated. He had a fingerstick blood glucose test at approximate 9:40 AM which showed a blood glucose of 27. He was given a tube of oral glucose, coke and peanut butter. His blood sugar is retested at 945 and was again at 27. He was given orange juice. At 950 without improvement in his blood sugar, he was given one amp of D50. Repeat blood glucose at 10 AM was 228. Shortly thereafter he had improvement in mental status. EMS was called and he refused transfer to the hospital. They evaluated him and found his blood glucose to be 209. He is felt to have a capacity to refuse transfer to the hospital. Reportedly his nuclear stress test was not completed.  Pixie Casino, MD, [facility] Attending Cardiologist [place]

## 2013-11-22 ENCOUNTER — Other Ambulatory Visit (HOSPITAL_COMMUNITY): Payer: Self-pay | Admitting: Internal Medicine

## 2013-11-22 DIAGNOSIS — I4891 Unspecified atrial fibrillation: Secondary | ICD-10-CM

## 2013-11-23 ENCOUNTER — Encounter (HOSPITAL_COMMUNITY): Payer: Self-pay | Admitting: *Deleted

## 2013-11-25 ENCOUNTER — Telehealth (HOSPITAL_COMMUNITY): Payer: Self-pay

## 2013-11-25 NOTE — Telephone Encounter (Signed)
Awaiting return call

## 2013-11-25 NOTE — Telephone Encounter (Signed)
Encounter complete. 

## 2013-11-30 NOTE — Telephone Encounter (Signed)
Encounter complete. 

## 2013-12-01 ENCOUNTER — Ambulatory Visit (HOSPITAL_COMMUNITY)
Admission: RE | Admit: 2013-12-01 | Discharge: 2013-12-01 | Disposition: A | Discharge status: Home / Self Care | Payer: BC Managed Care – PPO | Source: Ambulatory Visit | Attending: Cardiology | Admitting: Cardiology

## 2013-12-01 ENCOUNTER — Other Ambulatory Visit: Payer: Self-pay | Admitting: *Deleted

## 2013-12-01 DIAGNOSIS — I252 Old myocardial infarction: Secondary | ICD-10-CM | POA: Insufficient documentation

## 2013-12-01 DIAGNOSIS — E1139 Type 2 diabetes mellitus with other diabetic ophthalmic complication: Secondary | ICD-10-CM | POA: Insufficient documentation

## 2013-12-01 DIAGNOSIS — E11319 Type 2 diabetes mellitus with unspecified diabetic retinopathy without macular edema: Secondary | ICD-10-CM | POA: Insufficient documentation

## 2013-12-01 DIAGNOSIS — E1149 Type 2 diabetes mellitus with other diabetic neurological complication: Secondary | ICD-10-CM | POA: Insufficient documentation

## 2013-12-01 DIAGNOSIS — R42 Dizziness and giddiness: Secondary | ICD-10-CM | POA: Insufficient documentation

## 2013-12-01 DIAGNOSIS — I10 Essential (primary) hypertension: Secondary | ICD-10-CM | POA: Insufficient documentation

## 2013-12-01 DIAGNOSIS — Z794 Long term (current) use of insulin: Secondary | ICD-10-CM | POA: Insufficient documentation

## 2013-12-01 DIAGNOSIS — I2510 Atherosclerotic heart disease of native coronary artery without angina pectoris: Secondary | ICD-10-CM | POA: Insufficient documentation

## 2013-12-01 DIAGNOSIS — I4891 Unspecified atrial fibrillation: Secondary | ICD-10-CM

## 2013-12-01 DIAGNOSIS — E1142 Type 2 diabetes mellitus with diabetic polyneuropathy: Secondary | ICD-10-CM | POA: Insufficient documentation

## 2013-12-01 DIAGNOSIS — R0609 Other forms of dyspnea: Secondary | ICD-10-CM | POA: Insufficient documentation

## 2013-12-01 DIAGNOSIS — Z8249 Family history of ischemic heart disease and other diseases of the circulatory system: Secondary | ICD-10-CM | POA: Insufficient documentation

## 2013-12-01 DIAGNOSIS — Z9861 Coronary angioplasty status: Secondary | ICD-10-CM | POA: Insufficient documentation

## 2013-12-01 DIAGNOSIS — F17200 Nicotine dependence, unspecified, uncomplicated: Secondary | ICD-10-CM | POA: Insufficient documentation

## 2013-12-01 DIAGNOSIS — R0989 Other specified symptoms and signs involving the circulatory and respiratory systems: Secondary | ICD-10-CM | POA: Insufficient documentation

## 2013-12-01 MED ORDER — TECHNETIUM TC 99M SESTAMIBI GENERIC - CARDIOLITE
30.0000 | Freq: Once | INTRAVENOUS | Status: AC | PRN
  Administered 2013-12-01: 30 via INTRAVENOUS

## 2013-12-01 MED ORDER — REGADENOSON 0.4 MG/5ML IV SOLN
0.4000 mg | Freq: Once | INTRAVENOUS | Status: AC
  Administered 2013-12-01: 0.4 mg via INTRAVENOUS

## 2013-12-01 MED ORDER — TECHNETIUM TC 99M SESTAMIBI GENERIC - CARDIOLITE
10.0000 | Freq: Once | INTRAVENOUS | Status: AC | PRN
  Administered 2013-12-01: 10 via INTRAVENOUS

## 2013-12-01 MED ORDER — APIXABAN 5 MG PO TABS: 5.0000 mg | ORAL_TABLET | Freq: Two times a day (BID) | ORAL | Status: DC

## 2013-12-01 NOTE — Procedures (Addendum)
[facility] [address] [place] [address] [id number]  Cardiology Nuclear Med Study  Anthony [name] is a 63 y.o. male     MRN : [id number]     DOB: 08/11/1950  Procedure Date: 12/01/2013  Nuclear Med Background Indication for Stress Test:  Evaluation for Ischemia, Stent Patency and Post Hospital History:  CAD;MI-2000 and 2001;STENT/PTCA--2003;Last NUC MPI in 2007-nonischemic; Cardiac Risk Factors: Family History - CAD, Hypertension, IDDM Type 2, Lipids, Overweight, Smoker and diabetic neuropathy/retinopathy;venous inssufficiency-both legs;bilateral leg edema;hypoglycemia;  Symptoms:  Dizziness, DOE, Fatigue and Light-Headedness   Nuclear Pre-Procedure Caffeine/Decaff Intake:  7:00pm NPO After: 5:00am   IV Site: R Hand  IV 0.9% NS with Angio Cath:  22g  Chest Size (in):  46" IV Started by: Anthony Course, RN  Height: 6\' 1"  (1.854 m)  Cup Size: n/a  BMI:  Body mass index is 27.58 kg/(m^2). Weight:  209 lb (94.802 kg)   Tech Comments:  n/a    Nuclear Med Study 1 or 2 day study: 1 day  Stress Test Type:  [place] Provider:  Lyman Bishop, MD   Resting Radionuclide: Technetium 33m Sestamibi  Resting Radionuclide Dose: 10.4 mCi   Stress Radionuclide:  Technetium 60m Sestamibi  Stress Radionuclide Dose: 31.6 mCi           Stress Protocol Rest HR88 Stress HR: 87  Rest BP: 148/82 Stress BP: 148/86  Exercise Time (min): n/a METS: n/a   Predicted Max HR: 158 bpm % Max HR: 58.86 bpm Rate Pressure Product: 14136  Dose of Adenosine (mg):  n/a Dose of Lexiscan: 0.4 mg  Dose of Atropine (mg): n/a Dose of Dobutamine: n/a mcg/kg/min (at max HR)  Stress Test Technologist: Anthony [name], CCT Nuclear Technologist: Anthony [name], CNMT   Rest Procedure:  Myocardial perfusion imaging was performed at rest 45 minutes following the intravenous administration of Technetium 59m Sestamibi. Stress Procedure:  The  patient received IV Lexiscan 0.4 mg over 15-seconds.  Technetium 77m Sestamibi injected IV at 30-seconds.  There were no significant changes with Lexiscan.  Quantitative spect images were obtained after a 45 minute delay.  Transient Ischemic Dilatation (Normal <1.2)  1.12 LV Ejection Fraction: Study not gated  PHYSICIAN INTERPRETATION  Rest ECG: Atrial Fibrilliation  Stress ECG: No significant change from baseline ECG and No significant ST segment change suggestive of ischemia.  QPS Raw Data Images:  Acquisition technically good; normal left ventricular size. There is notable splanchnic visceral uptake that partially obscures the basal inferoseptal wall. Stress Images:  There is decreased uptake in the apex. There is decreased uptake in the lateral wall. Small area of moderate intensity.  No reversibility is appreciated. Rest Images:  There is decreased uptake in the lateral wall. There is decreased uptake in the apex. LV Wall Motion:  Gated Images not available due to Atrial Fibrillation. Subtraction (SDS):  There is a fixed defect that may be consistent with a previous infarction, however this cannot be fully determined in the absence of gated images. No evidence of ischemia.  Impression Exercise Capacity:  Lexiscan with no exercise. BP Response:  Normal blood pressure response. Clinical Symptoms:  There is dyspnea. ECG Impression:  No significant ECG changes with Lexiscan. Comparison with Prior Nuclear Study: No images to compare  Overall Impression:  Low risk stress nuclear study with what appears to be a small area of fixed perfusion defect that may be consistent with a prior apical-lateral infarction.Marland Kitchen No  evidence of ischemia.   Anthony Man, MD  12/02/2013 9:37 AM

## 2013-12-03 ENCOUNTER — Telehealth: Payer: Self-pay | Admitting: Internal Medicine

## 2013-12-03 NOTE — Telephone Encounter (Signed)
Mr. Mcelmurray is returning a call from [place].

## 2013-12-03 NOTE — Telephone Encounter (Signed)
Probably not one of those .Marland Kitchen Though I guess digoxin could. I would advise he stay on his current medications.  Dr. Lemmie Evens

## 2013-12-03 NOTE — Telephone Encounter (Signed)
Returned call to patient. Informed him of advice per Dr. Debara Pickett. Asked that patient call back in about a week if symptoms have no subsided and asked that he attempt to keep track/log of BP and HR in case he calls back we will have some VS information to work with. Patient voiced understanding and agreed with plan.

## 2013-12-03 NOTE — Telephone Encounter (Signed)
RN returned call to patient and provided normal stress test results.   While on phone, patent complained of fatigue and dizziness with position changes that subsides quickly. He reports last BP was 126/76 and HR is usually 60-100, but he only has ability to check it at pharmacy.   Patient denies CP, SOB, edema   Patient is concerned that Eliquis or Digoxin may be causing his fatigue and would like some information/advice on this.   Will defer to Dr. Debara Pickett.

## 2013-12-03 NOTE — Progress Notes (Signed)
LMTCB

## 2013-12-20 ENCOUNTER — Ambulatory Visit (INDEPENDENT_AMBULATORY_CARE_PROVIDER_SITE_OTHER): Payer: BC Managed Care – PPO | Admitting: Family Medicine

## 2013-12-20 ENCOUNTER — Encounter: Payer: Self-pay | Admitting: Family Medicine

## 2013-12-20 VITALS — BP 130/77 | HR 87 | Ht 73.0 in | Wt 209.0 lb

## 2013-12-20 DIAGNOSIS — I10 Essential (primary) hypertension: Secondary | ICD-10-CM

## 2013-12-20 DIAGNOSIS — E7800 Pure hypercholesterolemia, unspecified: Secondary | ICD-10-CM

## 2013-12-20 DIAGNOSIS — I4891 Unspecified atrial fibrillation: Secondary | ICD-10-CM

## 2013-12-20 NOTE — Assessment & Plan Note (Signed)
Good control with current meds (Coreg, Cozaar, Norvasc, Lasix, Imdur, also now on Eliquis and digoxin for a-fib). Continue current meds. Also appreciate any recommendations, if any, from cardiology as pt is following with them closely, as well. F/u as needed with me; monitor BP regularly.

## 2013-12-20 NOTE — Assessment & Plan Note (Signed)
Last lipid panel about a year ago, last CMP about 2 months ago; both relatively unremarkable, on Zocor daily. Advised pt to continue Zocor without changes and informed him he can take Norvasc nightly with Zocor without any problems. Likely repeat CMP and lipid panel at next f/u with diabetes labs.

## 2013-12-20 NOTE — Assessment & Plan Note (Signed)
A: Currently in afib without RVR, with occasional palpitations, but doing well by history and exam, now on Eliquis and digoxin in addition to his HTN meds. Some occasional fatigue perhaps secondary to digoxin, without frank evidence for CHF. BP also well-controlled. Pt to see Dr. Meda Coffee (cardiology) in the next month.  P: Advised to continue all meds as previously prescribed, including digoxin and Eliquis per Dr. Debara Pickett and colleagues. Counseled pt that his digoxin dose may need to be adjusted and/or his body may just need time to acclimate to the medication. Regardless encouraged good f/u with cardiology and reviewed red flags that would prompt return to clinic or presentation to the ED. F/u as needed with me, otherwise.

## 2013-12-20 NOTE — Progress Notes (Signed)
   Subjective:    Patient ID: Anthony [name], male    DOB: 1950/08/07, 63 y.o.   MRN: [id number]  HPI: Pt presents to clinic to follow up irregular heartbeat, HTN. He recently had cataract surgery and the providers there became very concerned and "made a big issue" out of his blood pressure, irregular heart rate (a-fib), and "rapid heartbeat," and he was told to follow up with me as his PCP and his cardiologist, which he is actively doing appropriately. He was slightly upset about it, but is "glad it's settled" and feels well, currently.  HTN - reports compliance with Coreg, Lasix, Cozaar, Imdur, and Norvasc - denies chest pain, LE swelling, active SOB (see below), headache, or changes in his vision (other than changes due to cataract surgery recently) - feels like his medications work well - questions whether it is okay to take amlodipine at night with his simvastatin  Atrial fibrillation - recently noted on exam, referred to cardiology (Dr. Debara Pickett, usually sees Dr. Meda Coffee) - now on Eliquis and digoxin in addition to other chronic meds - states he feels somewhat better than he did, but does state that walking fast or exerting himself does sometimes cause some palpitations - pt also has easily-exacerbated / increased fatigue with activity that he attributes to the digoxin  HLD - reports compliance with simvastatin, as above - denies new side effects, GI upset, or myalgia - states he takes the medication at night, and questions if it can or should be moved, as above, with amlodipine  Mood - reports he is still on Paxil without significant side effects - has completed sessions with Dr. Gwenlyn Saran and feels that she greatly helped him "recontextualize" how he was feeling - is now retired fully instead of trying to resume work; states he feels "more sane" - PHQ-2: total score 4, marked as 'not difficult'  Pt is a former smoker. In addition to the above documentation, pt's PMH, surgical history, FH, and  SH all reviewed and updated where appropriate in the EMR. I have also reviewed and updated the pt's allergies and current medications as appropriate.  Review of Systems: As above. Denies fever / chills, N/V, change in bowel habits, active chest pain, SOB, or change in vision. Otherwise, full 12-system ROS was reviewed and all negative.      Objective:   Physical Exam BP 130/77  Pulse 87  Ht 6\' 1"  (1.854 m)  Wt 209 lb (94.802 kg)  BMI 27.58 kg/m2 Gen: well-appearing adult male in NAD HEENT: /AT, sclerae/conjunctivae clear, no lid lag, EOMI, PERRLA   MMM, posterior oropharynx clear, no cervical lymphadenopathy  neck supple with full ROM, no masses appreciated; thyroid not enlarged  Cardio: irregularly irregular but rate normal, no murmur appreciated; distal pulses intact/symmetric Pulm: CTAB, no wheezes, normal WOB  Abd: soft, nondistended, BS+, no HSM Ext: warm/well-perfused, no cyanosis/clubbing/edema MSK: strength 5/5 in all four extremities, no frank joint deformity/effusion  normal ROM to all four extremities with no point muscle/bony tenderness in spine Neuro/Psych: alert/oriented, sensation grossly intact; normal gait/balance  mood euthymic with congruent affect; much more cheerful than recent visits with me     Assessment & Plan:  Briefly discussed recent issues with depression / anxiety, now doing well with Paxil; continue. See problem list notes, otherwise.

## 2013-12-20 NOTE — Patient Instructions (Signed)
Thank you for coming in, today!  It seems like the digoxin and Eliquis are good medicines for you to be on. I think you're right in that your body needs time to adjust to the medicine. It may be necessary to change some of your medicines or adjust your doses, but I wouldn't change anything yet. Keep taking those two new ones as directed.  Otherwise, keep taking everything as you have, before. It's okay to take the amlodipine and simvastatin at night.  Make sure you see Dr. Meda Coffee next month. I'll be able to see her note. Come back to see me sometime in September and we'll recheck your diabetes and everything else. Please feel free to call with any questions or concerns at any time, at [phone]. --Dr. Venetia Maxon

## 2013-12-23 ENCOUNTER — Other Ambulatory Visit: Payer: Self-pay | Admitting: Family Medicine

## 2013-12-24 MED ORDER — PAROXETINE HCL 40 MG PO TABS: 40.0000 mg | ORAL_TABLET | ORAL | Status: DC

## 2014-01-07 ENCOUNTER — Encounter: Payer: Self-pay | Admitting: Cardiology

## 2014-01-07 ENCOUNTER — Other Ambulatory Visit: Payer: Self-pay | Admitting: Family Medicine

## 2014-01-11 ENCOUNTER — Telehealth: Payer: Self-pay | Admitting: Family Medicine

## 2014-01-11 NOTE — Telephone Encounter (Signed)
Digoxin was prescribed by his cardiologist's office; if he is feeling better without it, it may be fine to just stop it, but he should call them to discuss, and possibly to be seen earlier than scheduled (currently has an appointment with them 9/16). Please let him know. Thanks! --CMS

## 2014-01-11 NOTE — Telephone Encounter (Signed)
FYI to PCP

## 2014-01-11 NOTE — Telephone Encounter (Signed)
Pt has discontinued use of digoxin due to it causing pt to becoming out of breath easily. Pt states he is doing better without meds. Please be advise.

## 2014-01-11 NOTE — Telephone Encounter (Signed)
Pt informed to call cardiologist and discuss with them that he stopped taking the medicine they prescribed. Zophia Marrone CMA

## 2014-01-13 ENCOUNTER — Other Ambulatory Visit: Payer: Self-pay | Admitting: Family Medicine

## 2014-01-20 ENCOUNTER — Encounter: Payer: BC Managed Care – PPO | Admitting: Cardiology

## 2014-01-24 ENCOUNTER — Other Ambulatory Visit: Payer: Self-pay | Admitting: Family Medicine

## 2014-01-24 ENCOUNTER — Telehealth: Payer: Self-pay | Admitting: Cardiology

## 2014-01-24 NOTE — Telephone Encounter (Signed)
Pt wanted you to know he could not take the Digoxin,it made him feel worse.

## 2014-01-24 NOTE — Telephone Encounter (Signed)
Spoke to patient he stated he saw Cecilie Kicks NP 11/09/13.Stated she prescribed Lanoxin 0.25 mg.Stated he only took it for a couple of days.Stated made him feel worse,sob,pounding heart.Stated he normally sees Dr.Nelson.Stated he don't know why he saw Dr.Hillty.Stated he has appointment with Dr.Nelson 02/09/14 at 9:00 am.Stated he feels better since he stopped taking.Advised to keep appointment with Dr.Nelson,will let Dr.Nelson know.

## 2014-01-25 MED ORDER — DIGOXIN 125 MCG PO TABS: 0.1250 mg | ORAL_TABLET | Freq: Every day | ORAL | Status: DC

## 2014-01-25 NOTE — Addendum Note (Signed)
Addended by: Nuala Alpha on: 01/25/2014 06:13 PM   Modules accepted: Orders, Medications

## 2014-01-25 NOTE — Telephone Encounter (Signed)
Informed pt that per Dr Meda Coffee he should cut his digoxin in half from 0.25mg  to 0.125 mg po daily.  Confirmed pharmacy of choice with the pt.  Informed the pt that if he still continues to feel bad, then he should notify our office of this.  Pt verbalized understanding and agrees with this plan.

## 2014-01-25 NOTE — Telephone Encounter (Signed)
Please tell him to cut digoxin in half - take only 0.125 mg daily

## 2014-02-07 ENCOUNTER — Encounter: Payer: BC Managed Care – PPO | Admitting: Cardiology

## 2014-02-09 ENCOUNTER — Ambulatory Visit (INDEPENDENT_AMBULATORY_CARE_PROVIDER_SITE_OTHER): Payer: BC Managed Care – PPO | Admitting: Cardiology

## 2014-02-09 ENCOUNTER — Encounter: Payer: Self-pay | Admitting: Family Medicine

## 2014-02-09 ENCOUNTER — Encounter: Payer: Self-pay | Admitting: Cardiology

## 2014-02-09 ENCOUNTER — Ambulatory Visit (INDEPENDENT_AMBULATORY_CARE_PROVIDER_SITE_OTHER): Payer: BC Managed Care – PPO | Admitting: Family Medicine

## 2014-02-09 VITALS — BP 149/95 | HR 69 | Temp 98.3°F | Ht 73.0 in | Wt 212.4 lb

## 2014-02-09 VITALS — BP 118/70 | HR 61 | Ht 73.0 in | Wt 212.0 lb

## 2014-02-09 DIAGNOSIS — IMO0001 Reserved for inherently not codable concepts without codable children: Secondary | ICD-10-CM

## 2014-02-09 DIAGNOSIS — I4891 Unspecified atrial fibrillation: Secondary | ICD-10-CM

## 2014-02-09 DIAGNOSIS — I10 Essential (primary) hypertension: Secondary | ICD-10-CM | POA: Diagnosis not present

## 2014-02-09 DIAGNOSIS — E7800 Pure hypercholesterolemia, unspecified: Secondary | ICD-10-CM

## 2014-02-09 DIAGNOSIS — E1165 Type 2 diabetes mellitus with hyperglycemia: Secondary | ICD-10-CM

## 2014-02-09 DIAGNOSIS — IMO0002 Reserved for concepts with insufficient information to code with codable children: Secondary | ICD-10-CM

## 2014-02-09 LAB — POCT GLYCOSYLATED HEMOGLOBIN (HGB A1C): Hemoglobin A1C: 8.2

## 2014-02-09 LAB — GLUCOSE, CAPILLARY
Comment 1: 4
Glucose-Capillary: 84 mg/dL (ref 70–99)
Glucose-Capillary: 35 mg/dL — CL (ref 70–99)

## 2014-02-09 NOTE — Progress Notes (Signed)
Patient ID: Anthony [name], male   DOB: 09/23/1950, 63 y.o.   MRN: [id number]    02/09/2014  PCP: Christa See, MD  No chief complaint on file.  Primary Cardiologist: Dr. Verl Blalock now Dr. Meda Coffee  HPI:  63 year-old male with previous history of acute MI and in 2001 , congestive heart failure may be diastolic, coronary artery disease ( 2001 cardiac cath revealed nonobstructive coronary disease with an EF of 60%).  In 2003 patient again had chest pain underwent cardiac cath with successful stenting of the circumflex lesion extending into the second obtuse marginal branch with a drug-eluting stent.  Myoview 2007 with no ischemia and EF of 59%.   Pt has with him an EKG from 2001 that also has a fib.  All other EKGs in the computer are with SR.    Pt was at opthamologist on Thursday the 11th of June and he was found to be in A. fib with RVR.  The office was called and it our office instructed the patient to go to the emergency room. In the initial EKG heart rate was 141.  In the emergency room his rate was improved control 110 though A. fib continued. His troponin was negative the ER note states patient will followup in the office.  He underwent a stress test in July that was negative for prior MI or ischemia. He denies any further chest pain or SOB. No palpitations. No syncope. Some orthostatic hypotension when he stands up.  He is complaint with his meds.  His Chads2Vas2  score is 4. No bruising from Eliquis.  Allergies  Allergen Reactions  . Enalapril Maleate Cough    REACTION: cough    Current Outpatient Prescriptions  Medication Sig Dispense Refill  . amLODipine (NORVASC) 10 MG tablet Take 10 mg by mouth daily.      Marland Kitchen apixaban (ELIQUIS) 5 MG TABS tablet Take 1 tablet (5 mg total) by mouth 2 (two) times daily.  60 tablet  6  . carvedilol (COREG) 25 MG tablet Take 25 mg by mouth 2 (two) times daily with a meal.      . digoxin (LANOXIN) 0.125 MG tablet Take 1 tablet (0.125 mg total) by  mouth daily.  90 tablet  3  . furosemide (LASIX) 40 MG tablet TAKE 1 TABLET (40 MG TOTAL) BY MOUTH DAILY.  90 tablet  1  . insulin NPH-regular Human (NOVOLIN 70/30) (70-30) 100 UNIT/ML injection Inject 30-60 Units into the skin 2 (two) times daily. 60 Units AM 30 Units PM      . isosorbide mononitrate (IMDUR) 60 MG 24 hr tablet Take 60 mg by mouth daily.      Marland Kitchen losartan (COZAAR) 100 MG tablet TAKE 1 TABLET (100 MG TOTAL) BY MOUTH DAILY.  90 tablet  2  . metFORMIN (GLUCOPHAGE) 1000 MG tablet TAKE 1 TABLET (1,000 MG TOTAL) BY MOUTH 2 (TWO) TIMES DAILY WITH A MEAL.  180 tablet  1  . Multiple Vitamin (MULTIVITAMIN) tablet Take 1 tablet by mouth daily.        . nitroGLYCERIN (NITROSTAT) 0.4 MG SL tablet Place 0.4 mg under the tongue every 5 (five) minutes as needed. If no relief by 3rd tab, call 911       . Omega 3 1000 MG CAPS Take 3 capsules by mouth daily.      Marland Kitchen oxymorphone (OPANA) 5 MG tablet Take 5-10 mg by mouth every 4 (four) hours as needed for pain (max 5 tablets daily).       Marland Kitchen  PARoxetine (PAXIL) 40 MG tablet Take 1 tablet (40 mg total) by mouth every morning.  90 tablet  3  . potassium chloride (K-DUR,KLOR-CON) 10 MEQ tablet Take 20 mEq by mouth daily.      . ranitidine (ZANTAC) 150 MG tablet Take 150 mg by mouth daily.      . simvastatin (ZOCOR) 20 MG tablet Take 1 tablet (20 mg total) by mouth at bedtime.  90 tablet  3   No current facility-administered medications for this visit.   Past Medical History  Diagnosis Date  . Diabetes mellitus   . Arthritis   . CHF (congestive heart failure)   . Hypertension   . Hyperlipidemia   . Depression   . Erectile dysfunction   . Onychomycosis   . GERD (gastroesophageal reflux disease)   . Edema of extremities   . Myocardial infarction 2000&2001  . Obstructive apnea   . Unspecified sleep apnea 02/22/2013   Past Surgical History  Procedure Laterality Date  . Angioplasty  2001    stent x 1  . Toe amputation Left 2012  . Colonoscopy   2000?    negative   [id number] colds or fevers, no weight changes Skin:no rashes or ulcers HEENT:no blurred vision, no congestion CV:see HPI PUL:see HPI GI:no diarrhea constipation or melena, no indigestion GU:no hematuria, no dysuria MS:no joint pain, no claudication Neuro:no syncope,+ lightheadedness Endo:+ diabetes has been stable., no thyroid disease  Wt Readings from Last 3 Encounters:  02/09/14 212 lb (96.163 kg)  12/20/13 209 lb (94.802 kg)  12/01/13 209 lb (94.802 kg)   PHYSICAL EXAM BP 118/70  Pulse 61  Ht 6\' 1"  (1.854 m)  Wt 212 lb (96.163 kg)  BMI 27.98 kg/m2 General:Pleasant affect, NAD Skin:Warm and dry, brisk capillary refill HEENT:normocephalic, sclera clear, mucus membranes moist Neck:supple, no JVD, no bruits , no adenopathy Heart:S1S2 irreg irreg rapid without murmur, gallup, rub or click Lungs:clear without rales, rhonchi, or wheezes [id number], non tender, + BS, do not palpate liver spleen or masses Ext:no lower ext edema, 2+ pedal pulses, 2+ radial pulses Neuro:alert and oriented, MAE, follows commands, + facial symmetry  EKG:A fib with RVR at 118  Septal infarct similar to EKG 11/04/13 when seen in ER.  Lexiscan nuclear stress test: 12/01/2013  Impression Exercise Capacity: Lexiscan with no exercise. BP Response: Normal blood pressure response. Clinical Symptoms: There is dyspnea. ECG Impression: No significant ECG changes with Lexiscan. Comparison with Prior Nuclear Study: No images to compare  Overall Impression: Low risk stress nuclear study with what  appears to be a small area of fixed perfusion defect that may be  consistent with a prior apical-lateral infarction.. No evidence  of ischemia.   ASSESSMENT AND PLAN  1. Atrial fibrillation with RVR  Found to be in A. fib with RVR when he went to have his cataracts done in June 2015. However the patient states that he has been in a-fib at his hospitalization in 2001. Chads2Vasc2 score is  4.  He is now rate controlled and asymptomatic. We had a long discussion and considering that his a-fib is chronic persistent we won't consider DCCV. We will order echo since none in records and h/o RVR.   2. CORONARY, ARTERIOSCLEROSIS  2003 with stent to the circumflex stenosis extending into the second obtuse marginal branch with DES. Last nuclear stress test July 2015 negative for ischemia   3. DM (diabetes mellitus), type 2, uncontrolled  Uncontrolled at times, insulin dependent   4. Anticoagulation adequate  We will begin Eliquis 5 mg BID to begin on June 18th in the evening. Once he begins the Eliquis he can stop his aspirin. He will meet with Cyril Mourning tomorrow to discuss anticoagulation.   5. H/O CHF  Currently euvolemic, history though of heart failure presumed to be diastolic.   Follow up in 6 months  Dorothy Spark 02/09/2014

## 2014-02-09 NOTE — Assessment & Plan Note (Signed)
Compliant with Zocor without frank side effects. Repeat CMP and lipid panel today as last check was over 1 year ago.

## 2014-02-09 NOTE — Assessment & Plan Note (Signed)
A: A1c 8.2 today, down from 8.6. Compliant with metformin BID, but only taking insulin 70/30 in the AM (60 units) and not the 30-unit PM dose. Did have hypoglycemia in clinic today to the 30's, which improved to >80 with glucose PO. Denies frank hypoglycemic episodes at home since cutting back to 70/30 once per day.  P: Cautiously restart insulin 70/30 15 units qPM and continue metformin BID. Recheck A1c in about 3 months. Also check CMP and lipid panel, today.

## 2014-02-09 NOTE — Assessment & Plan Note (Signed)
A: Currently in afib without RVR. On Eliquis and digoxin (recently reduced by cardiology) along with HTN meds. BP mildly elevated today.  P: Continue per cardiology, with f/u as needed. No changes to medications, today.

## 2014-02-09 NOTE — Patient Instructions (Signed)
Thank you for coming in, today!  Start taking insulin 70/30 at nighttime again, but only take 15 units. KEEP taking the 60 units in the morning. KEEP taking all your other medicines.  I will check some labs today. I'll call you or send you a letter.  Follow up with the cardiologist as you need / as they tell you. Come back to see me in about 3 months, or sooner if you need. Please feel free to call with any questions or concerns at any time, at [phone]. --Dr. Venetia Maxon

## 2014-02-09 NOTE — Patient Instructions (Signed)
Your physician recommends that you continue on your current medications as directed. Please refer to the Current Medication list given to you today.   Your physician has requested that you have an echocardiogram. Echocardiography is a painless test that uses sound waves to create images of your heart. It provides your doctor with information about the size and shape of your heart and how well your heart's chambers and valves are working. This procedure takes approximately one hour. There are no restrictions for this procedure.   Your physician wants you to follow-up in: 6 MONTHS WITH DR NELSON You will receive a reminder letter in the mail two months in advance. If you don't receive a letter, please call our office to schedule the follow-up appointment.  

## 2014-02-09 NOTE — Assessment & Plan Note (Signed)
Elevated today, though pt also with some hypoglycemia in clinic today; reports compliance with Coreg, Cozaar, Norvasc, Imdur, and Lasix, now also on Eliquis and digoxin for a-fib. Continue current medications and monitor BP routinely. Check kidney function with CMP, today.

## 2014-02-09 NOTE — Progress Notes (Signed)
   Subjective:    Patient ID: Anthony [name], male    DOB: 08-May-1951, 63 y.o.   MRN: [id number]  HPI: Pt presents to clinic for f/u of DM, HNT, HLD. He is seeing cardiology for atrial fibrillation.  DM - reports compliance with metformin 1000 mg BID and insulin 70/30 but is only taking 60 units in the morning without 30 units at night - denies frank symptoms of hypoglycemia, but states he feels this is due to stopping the insulin 70/30 at nighttime  HTN - reports compliance with Norvasc, Coreg, Imdur, losartan, and Lasix - denies chest pain, headache, changes in vision - occasionally has LE swelling he thinks is due to the amlodipine, but only sometimes  HLD - reports compliance with Zocor without new / different body or muscle aches - due for lipid panel check and liver labs  Atrial fibrillation - seeing cardiology, now on Eliquis (off ASA), and rate-controlled with Coreg and digoxin - has recently cut digoxin in half, and he feels like he is less tired and run-down  Pt is a current smoker, about 6 cigarettes per day, trying to quit on his own. In addition to the above documentation, pt's PMH, surgical history, FH, and SH all reviewed and updated where appropriate in the EMR. I have also reviewed and updated the pt's allergies and current medications as appropriate.  Review of Systems: As above. Otherwise, full 12-system ROS was reviewed and all negative.      Objective:   Physical Exam BP 149/95  Pulse 69  Temp(Src) 98.3 F (36.8 C) (Oral)  Ht 6' 1"$  (1.854 m)  Wt 212 lb 6.4 oz (96.344 kg)  BMI 28.03 kg/m2 Gen: well-appearing adult male in NAD HEENT: [place]/AT, sclerae/conjunctivae clear, no lid lag, EOMI, PERRLA; TM's obscured by cerumen  MMM, posterior oropharynx clear, no cervical lymphadenopathy  neck supple with full ROM, no masses appreciated; thyroid not enlarged  Cardio: irregularly irregular rhythm; no murmur appreciated; distal pulses intact/symmetric Pulm: CTAB, no  wheezes, normal WOB  Abd: soft, nondistended, BS+, no HSM Ext: warm/well-perfused, no cyanosis/clubbing/edema   Left 2nd toe surgically absent MSK: strength 5/5 in all four extremities, no frank joint deformity/effusion  normal ROM to all four extremities with no point muscle/bony tenderness in spine Neuro/Psych: alert/oriented, sensation grossly intact; normal gait/balance  mood euthymic with congruent affect  Foot exam: see flowsheet - no significant skin breakdown noted - pulses 1+ but symmetric - sensation diminished throughout - left 2nd toe surgically absent, as above     Assessment & Plan:  See problem list notes.

## 2014-02-10 ENCOUNTER — Other Ambulatory Visit: Payer: Self-pay | Admitting: Family Medicine

## 2014-02-10 ENCOUNTER — Encounter: Payer: Self-pay | Admitting: Family Medicine

## 2014-02-10 LAB — COMPREHENSIVE METABOLIC PANEL
ALT: 17 U/L (ref 0–53)
AST: 21 U/L (ref 0–37)
Albumin: 4.3 g/dL (ref 3.5–5.2)
Alkaline Phosphatase: 95 U/L (ref 39–117)
BUN: 15 mg/dL (ref 6–23)
CO2: 29 mEq/L (ref 19–32)
Calcium: 9.8 mg/dL (ref 8.4–10.5)
Chloride: 104 mEq/L (ref 96–112)
Creat: 1.03 mg/dL (ref 0.50–1.35)
Glucose, Bld: 32 mg/dL — CL (ref 70–99)
Potassium: 3.5 mEq/L (ref 3.5–5.3)
Sodium: 143 mEq/L (ref 135–145)
Total Bilirubin: 0.9 mg/dL (ref 0.2–1.2)
Total Protein: 7 g/dL (ref 6.0–8.3)

## 2014-02-10 LAB — LIPID PANEL
Cholesterol: 103 mg/dL (ref 0–200)
HDL: 46 mg/dL (ref >39)
LDL Cholesterol: 40 mg/dL (ref 0–99)
Total CHOL/HDL Ratio: 2.2 Ratio
Triglycerides: 86 mg/dL (ref <150)
VLDL: 17 mg/dL (ref 0–40)

## 2014-02-10 MED ORDER — ISOSORBIDE MONONITRATE ER 60 MG PO TB24: 60.0000 mg | ORAL_TABLET | Freq: Every day | ORAL | Status: DC

## 2014-02-23 ENCOUNTER — Ambulatory Visit (INDEPENDENT_AMBULATORY_CARE_PROVIDER_SITE_OTHER): Payer: BC Managed Care – PPO | Admitting: *Deleted

## 2014-02-23 ENCOUNTER — Ambulatory Visit (HOSPITAL_COMMUNITY): Payer: BC Managed Care – PPO | Attending: Cardiovascular Disease | Admitting: Cardiology

## 2014-02-23 DIAGNOSIS — E785 Hyperlipidemia, unspecified: Secondary | ICD-10-CM | POA: Diagnosis not present

## 2014-02-23 DIAGNOSIS — R0989 Other specified symptoms and signs involving the circulatory and respiratory systems: Secondary | ICD-10-CM

## 2014-02-23 DIAGNOSIS — I10 Essential (primary) hypertension: Secondary | ICD-10-CM | POA: Diagnosis not present

## 2014-02-23 DIAGNOSIS — Z23 Encounter for immunization: Secondary | ICD-10-CM | POA: Diagnosis not present

## 2014-02-23 DIAGNOSIS — E119 Type 2 diabetes mellitus without complications: Secondary | ICD-10-CM | POA: Diagnosis not present

## 2014-02-23 DIAGNOSIS — I4891 Unspecified atrial fibrillation: Secondary | ICD-10-CM | POA: Diagnosis not present

## 2014-02-23 NOTE — Progress Notes (Signed)
Echo performed. 

## 2014-03-02 ENCOUNTER — Ambulatory Visit (INDEPENDENT_AMBULATORY_CARE_PROVIDER_SITE_OTHER): Payer: BC Managed Care – PPO | Admitting: *Deleted

## 2014-03-02 VITALS — Temp 98.2°F

## 2014-03-02 DIAGNOSIS — L02429 Furuncle of limb, unspecified: Secondary | ICD-10-CM

## 2014-03-02 MED ORDER — DOXYCYCLINE HYCLATE 100 MG PO TABS: 100.0000 mg | ORAL_TABLET | Freq: Two times a day (BID) | ORAL | Status: DC

## 2014-03-02 NOTE — Progress Notes (Signed)
   Pt in nurse clinic for possible bug bite on left elbow.  Pt stated it started out as a small red spot and now it has gotten bigger.  Area is red and slightly open with small amount of greenish pus.  Pt denies any fever; temp today 98.2.  Precepted with Dr. Mingo Amber; assessed area.  Pt has a boil; have pt return on Friday for follow up; may need have boil drained.  Appt scheduled for 03/04/2014 with same day at 3:30 PM.  Will send in antibiotics to [facility].  Will forward chart to Dr. Mingo Amber and PCP.  Derl Barrow, RN

## 2014-03-02 NOTE — Progress Notes (Signed)
Patient ID: Anthony [name], male   DOB: 1950-12-16, 63 y.o.   MRN: [id number] Examined elbow.  Looks like boil with surrounding area of induration of about 4 cm in diameter.  Looks like it's spontaneously draining with some pus on surface.  Will attempt Doxycycline to cover mostly for MRSA.  FU in about 48 hours to see if needs drainage.

## 2014-03-04 ENCOUNTER — Ambulatory Visit (INDEPENDENT_AMBULATORY_CARE_PROVIDER_SITE_OTHER): Payer: BC Managed Care – PPO | Admitting: Family Medicine

## 2014-03-04 ENCOUNTER — Encounter: Payer: Self-pay | Admitting: Family Medicine

## 2014-03-04 VITALS — BP 111/72 | HR 86 | Temp 98.2°F | Wt 215.0 lb

## 2014-03-04 DIAGNOSIS — L02439 Carbuncle of limb, unspecified: Secondary | ICD-10-CM

## 2014-03-04 DIAGNOSIS — L02429 Furuncle of limb, unspecified: Secondary | ICD-10-CM | POA: Insufficient documentation

## 2014-03-04 NOTE — Patient Instructions (Signed)
Thank you for coming in, today!  The boil on your arm is looking better compared to how it was described. It doesn't look like I need to open the area, today. Make sure you finish the doxycycline. You can take Tylenol for any pain.  You can use an over-the-counter medicine like Neosporin if you want. Keep the area covered if you do. It is okay if the area keeps draining a little bit.  Avoid scratching or picking at the area. Come back to be seen after you finish the antibiotics to get checked out again. If it gets worse or if you start having fevers, vomiting, very high blood sugars, or other worse symptoms, come back sooner or go to the emergency room.  Please feel free to call with any questions or concerns at any time, at [phone]. --Dr. Venetia Maxon

## 2014-03-04 NOTE — Progress Notes (Signed)
   Subjective:    Patient ID: Anthony [name], male    DOB: [birth year], 63 y.o.   MRN: [id number]  HPI: Pt presents to [place] clinic to f/u boil on left elbow. He was seen by Dr. Mingo Amber at a nurse visit with Candiss Norse, RN and was put on doxycycline (today is day 3). The area started about a week ago as a small red bump, and slowly got bigger, more tender, and redder. It opened up on Wednesday and started draining some greenish pus. The area is not as red or tender, now, and still drains some small amounts now and then. He has not been taking any other medication other than his regular meds. He has no systemic symptoms such as fever, chills, N/V, or abdominal pain. He is compliant with his DM and other medications.  Review of Systems: As above.     Objective:   Physical Exam BP 111/72  Pulse 86  Temp(Src) 98.2 F (36.8 C) (Oral)  Wt 215 lb (97.523 kg) Gen: well-appearing adult male in NAD Cardio: RRR, no murmur Pulm: CTAB, no wheezes Ext: warm, well-perfused; left forearm with boil Skin: left forearm with ~1 cm eschar-covered wound without frank bleeding or drainage  Wound raised with peeling edges and some central hyperkeratosis, overall consistent with healing boil  No active drainage or bleeding, no definite fluctuance amenable to drainage  Approximately 1 cm of surrounding erythema with 2.5 cm diameter area of erythema  Otherwise no rashes or other lesions noted     Assessment & Plan:  See problem list note.

## 2014-03-04 NOTE — Assessment & Plan Note (Signed)
A: Present for about 1 week, with pt now on doxycycline x2 days with total 10 day course. Per pt and by description from Candiss Norse, RN and Dr. Mingo Amber, appears to be markedly improving. No systemic symptoms, no active drainage, no apparent fluctuance amenable to drainage.  P: Continue doxycycline to completion. Tylenol for pain. Reviewed red flags that would prompt immediate return to clinic. F/u in about 10 days for re-eval, or sooner if needed, or if completely resolved by then, PRN. Pt voices understanding. Planned f/u for chronic issues in about 2-3 months.

## 2014-03-11 ENCOUNTER — Other Ambulatory Visit: Payer: Self-pay | Admitting: Family Medicine

## 2014-03-19 ENCOUNTER — Other Ambulatory Visit: Payer: Self-pay | Admitting: Family Medicine

## 2014-03-21 ENCOUNTER — Telehealth: Payer: Self-pay | Admitting: Cardiology

## 2014-03-21 ENCOUNTER — Encounter: Payer: Self-pay | Admitting: Neurology

## 2014-03-21 NOTE — Telephone Encounter (Signed)
Patient has questions about medication please call and advise.

## 2014-03-21 NOTE — Telephone Encounter (Signed)
Pt calling to inform Dr Meda Coffee that he has completely stopped taking his digoxin all together, because its causing him to become very tired, sluggish, and he doesn't feel like getting up and moving around.  Pt states it also makes him feel sob and tightness in his chest.  Pt states he stopped taking this med last Friday 10/23, and is now feeling much better, with no complaints at all.  Pt states his energy has come back, and he feels "much better."  Pt states he can breath much better and he is no longer sluggish.  Pt states he is compliant with taking all other meds prescribed.  Pt just wanted to update Dr Meda Coffee and make her aware of this change.  Pt wants Dr Meda Coffee to know he would like to continue with current regimen, minus the digoxin.  Informed the pt that I will notify Dr Meda Coffee by routing this message and follow-up with him thereafter.  Pt verbalized understanding.

## 2014-03-22 NOTE — Telephone Encounter (Signed)
That's perfectly fine with me. He should watch and make sure he doesn't become tachycardic.

## 2014-03-22 NOTE — Telephone Encounter (Signed)
Pt notified that per Dr Meda Coffee its fine for him to stay off the digoxin, but he should closely monitor his heart rate to make sure he does not become tachycardic.  Notified the pt that if he does becomes tachycardic, then he should notify our office immediately.  Pt verbalized understanding and agrees with this plan.

## 2014-04-04 ENCOUNTER — Encounter: Payer: Self-pay | Admitting: Neurology

## 2014-04-04 ENCOUNTER — Ambulatory Visit (INDEPENDENT_AMBULATORY_CARE_PROVIDER_SITE_OTHER): Payer: BC Managed Care – PPO | Admitting: Neurology

## 2014-04-04 VITALS — BP 105/68 | HR 50 | Temp 98.3°F | Ht 70.0 in | Wt 224.0 lb

## 2014-04-04 DIAGNOSIS — Z9989 Dependence on other enabling machines and devices: Principal | ICD-10-CM

## 2014-04-04 DIAGNOSIS — G4733 Obstructive sleep apnea (adult) (pediatric): Secondary | ICD-10-CM | POA: Insufficient documentation

## 2014-04-04 NOTE — Progress Notes (Signed)
[facility]  Provider:  Larey Seat, M D  Referring Provider: Street, Sharon Mt, * Primary Care Physician:  Christa See, MD  Chief Complaint  Patient presents with  . Follow-up    post cpap,rm 11    HPI:  Anthony [name] is a 63 y.o. right handed african [place] male patient , seen upon referral from Dr. Brien Few  for an evaluation and treatment of possible sleep apnea.  Anthony [name]  referral is dated 12-27 2013 and was signed by Dr. Marlaine Hind,  His referring physician indicated that the patient has diabetes chronic lower back pain,  radiating to the lower extremities, and that is treated with chronic opiate therapy.  The need for opioids in his pain therapy was a  concern to be addressed in a sleep consultation and possible polysomnogram, mainly to rule out that central apneas will be triggered by pain medication. Anthony [name] is 63 years of age and works at [employer] , Engineer, materials trucks, checking in and out of vehicles. Exam of 11:30 PM and falls asleep very quickly, arises at 5 AM.  This  means that he only gets about 5-1/2 hours of sleep on average. He usually doesn't need the alarm he wakes up spontaneously. He has 2-3 times at night bathroom breaks interrupting his  sleep.The patient endorses muscle cramps and lower extremity aches, constipation,  Witnessed  snoring, some depression.He does not feel that he has to much or too little sleep. He did not endorse swelling in his legs, wheezing, hearing loss, incontinence to stool or urine.  No witnessed apneas.  He is a past medical history of hypertension, diabetes, and heart disease, he is an active smoker of 1/2 packs per day but he does not drink any alcohol, denies  any use of illegal drugs. He had nasal facial surgery, it appears like a cleft lip repair , but he states this is following an accident in his youth.  He is excessive sleepy in daytime, drowsy when not physically active and  stimulated.  Epworth 14 , FSS is 23, GDS 6 points.    Interval history :  04-04-14  The patient has meanwhile retired form his job and is doing well. His sleep study on 2014 is discussed today, [name] underwent a split night polysomnography on 03-03-13. At the time he endorsed the Epworth Sleepiness Scale at 14 points the geriatric depression score at 6 points on the fatigue severity score at 23 points. His AHI was 34.5 and RDI was 37.3. There was a strong REM accentuation of his apnea at an AHI of 90 during REM sleep. Supine  apnea was also 65.2/hr. He had only 9 minutes of desaturations at all below 90% his heart rates showed a sinus rhythm with intermittent PVCs. There were no significant periodic limb movements noted. The patient was titrated to 7 cm water pressure on CPAP,  his AHI became 0.0,  he is using a nasal pillow mask -called swift FX.   Download, compliance visit. The download data today confirms that the patient has used the machine for 30 days out of 30 days. 18 of those days over 4 hours. His average use of 4 hours 14 minutes the machine is set at 7 cm water pressure with 2 cm EPR is residual AHI is 3.1 he has a moderate air leak. Overall his apnea is very well controlled but the patient can improve on compliance by either using his machine at least 30  more minutes each night and also making sure that he puts the mask/ switches the machine on right away when he goes to bed. He still has nocturia , he can sleep supine now, with CPAP. He sleeps on average 7 hours now, but why is his compliance time so meager?  We discussed strategies to  improve compliance.  We discussed the results and explained the risks associated with untreated sleep apnea. He is aware of his irregular heart beat.             Review of Systems: Epworth is now 9 points, FSS is 18 points on CPAP.      History   Social History  . Marital Status: Married    Spouse Name: Anthony [name]    Number of Children:  2  . Years of Education: GED   Occupational History  .    [employer]     [employer]   Social History Main Topics  . Smoking status: Current Some Day Smoker -- 0.25 packs/day for 45 years    Types: Cigarettes    Start date: 05/28/1967    Last Attempt to Quit: 03/04/2010  . Smokeless tobacco: Never Used  . Alcohol Use: No     Comment: quit in 1993  . Drug Use: No  . Sexual Activity: Not on file   Other Topics Concern  . Not on file   Social History Narrative   Patient is right handed, consumes caffeine rarely. Patient resides in home with wife.    Family History  Problem Relation Age of Onset  . Colon cancer Neg Hx   . Stomach cancer Neg Hx   . Heart attack Mother   . Heart attack Father     Past Medical History  Diagnosis Date  . Diabetes mellitus   . Arthritis   . CHF (congestive heart failure)   . Hypertension   . Hyperlipidemia   . Depression   . Erectile dysfunction   . Onychomycosis   . GERD (gastroesophageal reflux disease)   . Edema of extremities   . Myocardial infarction 2000&2001  . Obstructive apnea   . Unspecified sleep apnea 02/22/2013    Past Surgical History  Procedure Laterality Date  . Angioplasty  2001    stent x 1  . Toe amputation Left 2012  . Colonoscopy  2000?    negative    Current Outpatient Prescriptions  Medication Sig Dispense Refill  . amLODipine (NORVASC) 10 MG tablet Take 10 mg by mouth daily.    [employer] apixaban (ELIQUIS) 5 MG TABS tablet Take 1 tablet (5 mg total) by mouth 2 (two) times daily. 60 tablet 6  . carvedilol (COREG) 25 MG tablet Take 25 mg by mouth 2 (two) times daily with a meal.    . doxycycline (VIBRA-TABS) 100 MG tablet Take 1 tablet (100 mg total) by mouth 2 (two) times daily. X 10 days 20 tablet 0  . furosemide (LASIX) 40 MG tablet TAKE 1 TABLET (40 MG TOTAL) BY MOUTH DAILY. 90 tablet 1  . insulin NPH-regular Human (NOVOLIN 70/30) (70-30) 100 UNIT/ML injection Inject 30-60 Units into the skin 2 (two)  times daily. 60 Units AM 30 Units PM    . isosorbide mononitrate (IMDUR) 60 MG 24 hr tablet Take 1 tablet (60 mg total) by mouth daily. 90 tablet 1  . losartan (COZAAR) 100 MG tablet TAKE 1 TABLET (100 MG TOTAL) BY MOUTH DAILY. 90 tablet 2  . metFORMIN (GLUCOPHAGE) 1000 MG tablet TAKE  1 TABLET (1,000 MG TOTAL) BY MOUTH 2 (TWO) TIMES DAILY WITH A MEAL. 180 tablet 1  . Multiple Vitamin (MULTIVITAMIN) tablet Take 1 tablet by mouth daily.      . nitroGLYCERIN (NITROSTAT) 0.4 MG SL tablet Place 0.4 mg under the tongue every 5 (five) minutes as needed. If no relief by 3rd tab, call 911     . NOVOLIN 70/30 (70-30) 100 UNIT/ML injection INJECT 60 UNITS INTO THE SKIN 2 (TWO) TIMES DAILY. 110 mL 1  . Omega 3 1000 MG CAPS Take 3 capsules by mouth daily.    [employer] oxymorphone (OPANA) 5 MG tablet Take 5-10 mg by mouth every 4 (four) hours as needed for pain (max 5 tablets daily).     [employer] PARoxetine (PAXIL) 40 MG tablet Take 1 tablet (40 mg total) by mouth every morning. 90 tablet 3  . potassium chloride (K-DUR,KLOR-CON) 10 MEQ tablet Take 20 mEq by mouth daily.    . ranitidine (ZANTAC) 150 MG tablet Take 150 mg by mouth daily.    . simvastatin (ZOCOR) 20 MG tablet Take 1 tablet (20 mg total) by mouth at bedtime. 90 tablet 3   No current facility-administered medications for this visit.    Allergies as of 04/04/2014 - Review Complete 04/04/2014  Allergen Reaction Noted  . Enalapril maleate Cough 08/19/2005    Vitals: BP 105/68 mmHg  Pulse 50  Temp(Src) 98.3 F (36.8 C) (Oral)  Ht 5\' 10"  (1.778 m)  Wt 224 lb (101.606 kg)  BMI 32.14 kg/m2 Last Weight:  Wt Readings from Last 1 Encounters:  04/04/14 224 lb (101.606 kg)   Last Height:   Ht Readings from Last 1 Encounters:  04/04/14 5\' 10"  (1.778 m)    Physical exam:  General: The patient is awake, alert and appears not in acute distress. The patient is well groomed. Head: Normocephalic, atraumatic. Neck is supple. Mallampati 3, neck circumference:  16.5 inches.  No nasal deviation. Cardiovascular:  Regular rate and rhythm  without  murmurs or carotid bruit, and without distended neck veins. Respiratory: Lungs are clear to auscultation. Skin:  Without evidence of edema, or rash Trunk: BMI is elevated, but the patient has normal posture.  Neurologic exam : The patient is awake and alert, oriented to place and time. Memory subjective described as intact.  Cranial nerves: Pupils are equal and briskly reactive to light. Extraocular movements  in vertical and horizontal planes intact and without nystagmus. Visual fields by finger perimetry are intact. Hearing to finger rub intact.  Facial sensation intact to fine touch. Facial motor strength is symmetric and tongue and uvula move midline.  Motor exam:   Normal tone and normal muscle bulk - symmetric  strength in all extremities. Strong grip. He reports trouble with screwdriver use. Cramping at the wrist. Spasms  In lower extremities. No RLS.   Sensory:  Fine touch, pinprick and vibration were tested in all extremities. Reduced pin prick, vibration and filament touch in both feet.  Coordination: Rapid alternating movements in the fingers/hands is tested and normal. Finger-to-nose maneuverwithout evidence  of ataxia, dysmetria or tremor.  Gait and station: intact.    Assessment:  After physical and neurologic examination, review of  pre-existing records. 1) confirmed OSA, central apneas not noted,   Compliance improved , EDS,   He had 2 heart atatcks, 2 stents were placed in 2010-2001. He smokes , COPD possible.   Plan:  Treatment plan and additional workup :   Continue at 7cm water CPAP ,  with current mask.  Improve compliance -  establish routines.  He is encouraged to put the CPAP on , when he is at rest and ready to sleep.  This includes naps. RV in 12 month with machine and Epworth . May see NP.

## 2014-05-11 ENCOUNTER — Encounter: Payer: Self-pay | Admitting: Family Medicine

## 2014-05-11 ENCOUNTER — Ambulatory Visit (INDEPENDENT_AMBULATORY_CARE_PROVIDER_SITE_OTHER): Payer: BC Managed Care – PPO | Admitting: Family Medicine

## 2014-05-11 VITALS — BP 112/68 | HR 75 | Temp 97.8°F | Ht 70.0 in | Wt 221.0 lb

## 2014-05-11 DIAGNOSIS — L608 Other nail disorders: Secondary | ICD-10-CM

## 2014-05-11 DIAGNOSIS — I4891 Unspecified atrial fibrillation: Secondary | ICD-10-CM

## 2014-05-11 DIAGNOSIS — B351 Tinea unguium: Secondary | ICD-10-CM

## 2014-05-11 DIAGNOSIS — E1165 Type 2 diabetes mellitus with hyperglycemia: Secondary | ICD-10-CM

## 2014-05-11 DIAGNOSIS — I10 Essential (primary) hypertension: Secondary | ICD-10-CM

## 2014-05-11 DIAGNOSIS — E7800 Pure hypercholesterolemia, unspecified: Secondary | ICD-10-CM

## 2014-05-11 DIAGNOSIS — IMO0002 Reserved for concepts with insufficient information to code with codable children: Secondary | ICD-10-CM

## 2014-05-11 LAB — POCT GLYCOSYLATED HEMOGLOBIN (HGB A1C): Hemoglobin A1C: 7.2

## 2014-05-11 NOTE — Assessment & Plan Note (Signed)
Bilateral multiple onychomycoses and thickened / dysmorphic toenails, especially of great toes (s/p 2nd left toe amputation for osteo), with history of diabetes. Referred to podiatry, today. F/u as needed, after that.

## 2014-05-11 NOTE — Patient Instructions (Signed)
Thank you for coming in, today!  Your A1c today is 7.4, down from 8.2. Don't change any medicines, today. Call your pharmacy if you need refills. I will refer you to the podiatrist today. They will call you with an appointment probably in the next couple of weeks. If you don't hear anything by Christmas, give [place] a call.  Come back to see me in 3-4 months. Please feel free to call with any questions or concerns at any time, at [phone]. --Dr. Venetia Maxon

## 2014-05-11 NOTE — Assessment & Plan Note (Signed)
BP well-controlled, today, reports compliance with Coreg, Cozaar, Norvasc, Imdur, and Lasix. Also on Eliquis for a-fib but now off digoxin. Continue current medications and monitor BP routinely. Likely recheck CMP at f/u in 3 months.

## 2014-05-11 NOTE — Assessment & Plan Note (Signed)
A: A1c 7.2 today, down from 8.2. Compliant with metformin BID, but still only taking insulin 70/30 in the AM (60 units) and not the 30-unit PM dose; pt has had persistent night-time hypoglycemia that he attributes to his evening insulin dose. Denies frank hypoglycemic episodes at home otherwise.  P: Continue metformin BID and insulin 70/30 at least 60 units qAM; counseled pt that he can try lower doses for a PM dose but he remains hesitant to do this. Recheck A1c in about 3 months. Likely recheck CMP at that point, as well.

## 2014-05-11 NOTE — Progress Notes (Signed)
   Subjective:    Patient ID: Anthony [name], male    DOB: November 04, 1950, 63 y.o.   MRN: [id number]  HPI: Pt presents to clinic for f/u of DM, HNT, HLD. He states he feels "very good," today, stating, "It's a nice change to come in feeling good and see stuff looking good," referring to his blood pressure (see below).  DM - reports compliance with metformin 1000 mg BID and insulin 70/30 - remains only taking insulin 70/30 60 units in the morning without PM dose of 30 units (pt has had repeated middle-of-the-night hypoglycemic symptoms) - pt takes insulin in the AM, metformin mid-day, and second metformin before bed - denies frank symptoms of hypoglycemia other than as mentioned above  HTN - reports compliance with Norvasc, Coreg, Imdur, losartan, and Lasix - denies chest pain, headache, changes in vision - denies frank / persistent LE swelling (sometimes gets ankle swelling when he is on his feet, that goes away overnight)  HLD - reports compliance with Zocor without new / different body or muscle aches  Atrial fibrillation - seeing cardiology, now on Eliquis (off ASA), and rate-controlled with Coreg - stopped digoxin about 1.5 months ago due to side effects (fatigue, "run-down" feeling, occasional SOB, etc) - has not had persistent tachycardia or palpitations - due to see cardiology in about 3 months  Pt is a current smoker, still trying to quit on his own, down to a "handful of cigarettes" per day (5 or fewer) In addition to the above documentation, pt's PMH, surgical history, FH, and SH all reviewed and updated where appropriate in the EMR. I have also reviewed and updated the pt's allergies and current medications as appropriate.  Review of Systems: As above. Otherwise, full 12-system ROS was reviewed and all negative.      Objective:   Physical Exam BP 112/68 mmHg  Pulse 75  Temp(Src) 97.8 F (36.6 C) (Oral)  Ht 5\' 10"  (1.778 m)  Wt 221 lb (100.245 kg)  BMI 31.71 kg/m2 Gen:  well-appearing adult male in NAD HEENT: [place]/AT, sclerae/conjunctivae clear, no lid lag, EOMI, PERRLA; TM's obscured by cerumen  MMM, posterior oropharynx clear, no cervical lymphadenopathy  neck supple with full ROM, no masses appreciated; thyroid not enlarged  Cardio: irregularly irregular rhythm; no murmur appreciated; distal pulses intact/symmetric Pulm: CTAB, no wheezes, normal WOB  Abd: soft, nondistended, BS+, no HSM Ext: warm/well-perfused, no cyanosis/clubbing/edema   Left 2nd toe surgically absent MSK: strength 5/5 in all four extremities, no frank joint deformity/effusion  normal ROM to all four extremities with no point muscle/bony tenderness in spine Neuro/Psych: alert/oriented, sensation grossly intact; normal gait/balance  mood euthymic with congruent affect  Foot exam: see flowsheet - no significant skin breakdown noted - pulses 1+ but symmetric - sensation diminished throughout - left 2nd toe surgically absent, as above     Assessment & Plan:  See problem list notes.

## 2014-05-11 NOTE — Assessment & Plan Note (Signed)
Compliant with Zocor without frank side effects, with good reduction in LDL on lipid panel last visit. CMP unremarkable last visit. Recheck CMP in about 3 months.

## 2014-05-11 NOTE — Assessment & Plan Note (Signed)
A: Clinically still in afib without RVR, rate-controlled with Coreg and on Eliquis per cardiology. Now off digoxin due to side effects. BP well-controlled. No frank symptoms reported.  P: Continue Eliquis, HTN meds, statin, and f/u with cardiology as instructed.

## 2014-05-16 ENCOUNTER — Telehealth: Payer: Self-pay | Admitting: *Deleted

## 2014-05-16 NOTE — Telephone Encounter (Signed)
Pt is having spinal injections on Wednesday.  Wants to know if he can stop eliquis, until appt and restart afterwards.  Dr. Venetia Maxon paged and ok given.  Pt informed. [place], Salome Spotted

## 2014-06-02 ENCOUNTER — Other Ambulatory Visit: Payer: Self-pay | Admitting: Family Medicine

## 2014-06-08 ENCOUNTER — Telehealth: Payer: Self-pay | Admitting: Family Medicine

## 2014-06-08 NOTE — Telephone Encounter (Signed)
Pt called and needs a medication List printed out to take to his appointment on 1/14 at the [facility]. jw

## 2014-06-08 NOTE — Telephone Encounter (Signed)
Patient informed med list was ready to be picked up.

## 2014-06-09 ENCOUNTER — Ambulatory Visit (INDEPENDENT_AMBULATORY_CARE_PROVIDER_SITE_OTHER): Payer: BC Managed Care – PPO

## 2014-06-09 ENCOUNTER — Ambulatory Visit (INDEPENDENT_AMBULATORY_CARE_PROVIDER_SITE_OTHER): Payer: BC Managed Care – PPO | Admitting: Podiatry

## 2014-06-09 VITALS — BP 98/69 | HR 85 | Resp 17

## 2014-06-09 DIAGNOSIS — R52 Pain, unspecified: Secondary | ICD-10-CM

## 2014-06-09 DIAGNOSIS — M79606 Pain in leg, unspecified: Secondary | ICD-10-CM

## 2014-06-09 DIAGNOSIS — E1142 Type 2 diabetes mellitus with diabetic polyneuropathy: Secondary | ICD-10-CM

## 2014-06-09 DIAGNOSIS — Q6650 Congenital pes planus, unspecified foot: Secondary | ICD-10-CM

## 2014-06-09 DIAGNOSIS — B351 Tinea unguium: Secondary | ICD-10-CM

## 2014-06-09 NOTE — Progress Notes (Signed)
   Subjective:    Patient ID: Anthony [name], male    DOB: September 06, 1950, 64 y.o.   MRN: [id number]  HPI Pt presents with bilateral toenail thickness, painful   Review of Systems  All other systems reviewed and are negative.      Objective:   Physical Exam: I have reviewed his past mental history medications allergies surgery social history and review of systems. Pulses are strongly palpable bilateral. Neurologic sensorium is decreased percent lasting monofilament. Deep tendon reflexes are intact and deep pain receptors are intact. Muscle strength +5 over 5 dorsiflexion and plantar flexors and inverters and everters all intrinsic musculature appears to be intact. Orthopedic evaluation demonstrates moderate pes planus bilateral. Radiograph demonstrates pes planus with osteophytic changes of the midfoot. Cutaneous evaluation demonstrates supple well-hydrated cutis thick yellow dystrophic clinically mycotic nails bilateral.        Assessment & Plan:  Assessment: Diabetes mellitus with the history of diabetic peripheral neuropathy. Pain in limb second onychomycosis bilateral. Pes planus bilateral.  Plan: Debridement of nails 1 through 5 bilateral. Follow up with him in 3 months.

## 2014-06-23 ENCOUNTER — Other Ambulatory Visit: Payer: Self-pay | Admitting: Internal Medicine

## 2014-06-23 NOTE — Telephone Encounter (Signed)
Rx has been sent to the pharmacy electronically. ° °

## 2014-07-11 ENCOUNTER — Other Ambulatory Visit: Payer: Self-pay | Admitting: Family Medicine

## 2014-07-17 ENCOUNTER — Other Ambulatory Visit: Payer: Self-pay | Admitting: Family Medicine

## 2014-07-21 ENCOUNTER — Other Ambulatory Visit: Payer: Self-pay | Admitting: Family Medicine

## 2014-07-22 ENCOUNTER — Encounter: Payer: Self-pay | Admitting: Family Medicine

## 2014-07-22 ENCOUNTER — Ambulatory Visit (INDEPENDENT_AMBULATORY_CARE_PROVIDER_SITE_OTHER): Payer: BC Managed Care – PPO | Admitting: Family Medicine

## 2014-07-22 VITALS — BP 131/81 | HR 110 | Temp 98.0°F | Ht 70.0 in | Wt 231.1 lb

## 2014-07-22 DIAGNOSIS — E7800 Pure hypercholesterolemia, unspecified: Secondary | ICD-10-CM

## 2014-07-22 DIAGNOSIS — Z720 Tobacco use: Secondary | ICD-10-CM

## 2014-07-22 DIAGNOSIS — I4891 Unspecified atrial fibrillation: Secondary | ICD-10-CM | POA: Diagnosis not present

## 2014-07-22 DIAGNOSIS — E1165 Type 2 diabetes mellitus with hyperglycemia: Secondary | ICD-10-CM | POA: Diagnosis not present

## 2014-07-22 DIAGNOSIS — IMO0002 Reserved for concepts with insufficient information to code with codable children: Secondary | ICD-10-CM

## 2014-07-22 DIAGNOSIS — I10 Essential (primary) hypertension: Secondary | ICD-10-CM

## 2014-07-22 NOTE — Assessment & Plan Note (Signed)
A: No further hypoglycemic episodes reported, though now only taking metformin 1000 mg BID and insulin 70/30 BID (60 qAM, 20 qPM, down from prior 60 qAM, 30 qPM). Last A1c only a couple of months ago. Generally feels well.  P: Continue metformin and insulin 70/30. Advised improved consistency in checking CBG's at home. F/u in another 6-8 weeks, and likely repeat labs at that time.

## 2014-07-22 NOTE — Assessment & Plan Note (Signed)
A: BP well-controlled, on Coreg, Cozaar, Norvasc, Imdur, and Lasix. No chest pain, SOB, or persistent LE edema. Continue current meds and monitor routinely at f/u, in about 3 months.

## 2014-07-22 NOTE — Assessment & Plan Note (Signed)
A: Rate-controlled with Coreg and on Eliquis, following with cardiology. Intolerant of digoxin and BP well-controlled, otherwise. No frank symptoms to suggest ACS or CVA.  P: Continue Coreg and Eliquis. Encouraged f/u with cardiology in about 2 months (will be 6 months from last visit), then with me after that, or sooner as needed.

## 2014-07-22 NOTE — Assessment & Plan Note (Signed)
A: Cutting back on smoking has been unsuccessful for the past few months, though pt has managed to reduce considerably over the past few years; he is now smoking a quarter of a pack per day or so, and declines formal assistance, for now.   P: Praised cutting back and encouraged further attempts to stop completely. F/u routinely.

## 2014-07-22 NOTE — Progress Notes (Signed)
   Subjective:    Patient ID: Anthony [name], male    DOB: December 12, 1950, 64 y.o.   MRN: [id number]  HPI: Pt presents to clinic for f/u of DM, HNT, HLD.  DM - reports compliance with metformin 1000 mg BID and insulin 70/30 - remains only taking insulin 70/30 60 units in the morning without PM dose of 20 units - pt takes insulin in the AM, metformin mid-day, and second metformin before bed - does report he "almost never" checks his blood sugar at home; last was about a month ago - denies frank symptoms of hypoglycemia; previously had some early AM (2-3 AM) or soon-after-waking tremors, etc  HTN - reports compliance with Norvasc, Coreg, Imdur, losartan, and Lasix - denies chest pain, headache, changes in vision - denies orthopnea or PND - uses CPAP at night - denies frank / persistent LE swelling (continues to have ankle swelling when he is on his feet, that goes away overnight)  HLD - reports compliance with Zocor without new / different body or muscle aches  Atrial fibrillation - seeing cardiology, now on Eliquis (off ASA), and rate-controlled with Coreg - stopped digoxin about 1.5 months ago due to side effects (fatigue, "run-down" feeling, occasional SOB, etc) - has not had persistent tachycardia or palpitations - due to see cardiology in April, he thinks  Pt is a current smoker, has had trouble cutting back more, down to a "handful of cigarettes" per day (6-8 per day) In addition to the above documentation, pt's PMH, surgical history, FH, and SH all reviewed and updated where appropriate in the EMR. I have also reviewed and updated the pt's allergies and current medications as appropriate.  Review of Systems: As above. Otherwise, full 12-system ROS was reviewed and all negative.      Objective:   Physical Exam BP 131/81 mmHg  Pulse 110  Temp(Src) 98 F (36.7 C) (Oral)  Ht 5\' 10"  (1.778 m)  Wt 231 lb 1.6 oz (104.826 kg)  BMI 33.16 kg/m2 Manual recheck pulse ~85 Gen:  well-appearing adult male in NAD HEENT: [place]/AT, sclerae/conjunctivae clear, no lid lag, EOMI, PERRLA  MMM, posterior oropharynx clear, no cervical lymphadenopathy  neck supple with full ROM, no masses appreciated; thyroid not enlarged  Cardio: irregularly irregular rhythm; no murmur appreciated; distal pulses intact/symmetric Pulm: CTAB, no wheezes, normal WOB  Abd: soft, nondistended, BS+, no HSM Ext: warm/well-perfused, no cyanosis/clubbing/edema MSK: strength 5/5 in all four extremities, no frank joint deformity/effusion  normal ROM to all four extremities with no point muscle/bony tenderness in spine Neuro/Psych: alert/oriented, sensation grossly intact; normal gait/balance  mood euthymic with congruent affect     Assessment & Plan:  See problem list notes. Note pt has not had recommended one-time HIV screening. Will discuss and possibly do this with labs at next office visit.

## 2014-07-22 NOTE — Patient Instructions (Signed)
Thank you for coming in, today!  Everything looks okay. We'll recheck your labs in about 2 months. Don't change any medicines, today. Call your pharmacy if you need refills.  Come back to see me in 2-4 months. Call the cardiologist to be seen by them around the same time. Please feel free to call with any questions or concerns at any time, at [phone]. --Dr. Venetia Maxon

## 2014-07-22 NOTE — Assessment & Plan Note (Signed)
Compliant with Zocor without frank sie effects. Last lipid panel in Sept 2015, so not due for another several months for recheck. Likely recheck CMP at next f/u in about 3 months.

## 2014-07-24 ENCOUNTER — Other Ambulatory Visit: Payer: Self-pay | Admitting: Family Medicine

## 2014-08-08 ENCOUNTER — Other Ambulatory Visit: Payer: Self-pay | Admitting: Family Medicine

## 2014-08-08 MED ORDER — SIMVASTATIN 20 MG PO TABS: 20.0000 mg | ORAL_TABLET | Freq: Every day | ORAL | Status: DC

## 2014-08-08 MED ORDER — LOSARTAN POTASSIUM 100 MG PO TABS: ORAL_TABLET | ORAL | Status: DC

## 2014-08-08 MED ORDER — ISOSORBIDE MONONITRATE ER 60 MG PO TB24: ORAL_TABLET | ORAL | Status: DC

## 2014-08-08 NOTE — Telephone Encounter (Signed)
Needs refill on isosorbide--90 day supply simvastatin day supply --90 day supply Losartan-90 day supply   [facility] [address]   [phone]

## 2014-08-09 ENCOUNTER — Other Ambulatory Visit: Payer: Self-pay | Admitting: Family Medicine

## 2014-08-17 ENCOUNTER — Other Ambulatory Visit: Payer: Self-pay | Admitting: Family Medicine

## 2014-08-31 ENCOUNTER — Ambulatory Visit (INDEPENDENT_AMBULATORY_CARE_PROVIDER_SITE_OTHER): Payer: BC Managed Care – PPO | Admitting: Cardiology

## 2014-08-31 ENCOUNTER — Encounter: Payer: Self-pay | Admitting: Cardiology

## 2014-08-31 VITALS — BP 110/60 | HR 112 | Ht 73.0 in | Wt 229.1 lb

## 2014-08-31 DIAGNOSIS — I4891 Unspecified atrial fibrillation: Secondary | ICD-10-CM | POA: Diagnosis not present

## 2014-08-31 DIAGNOSIS — Z8679 Personal history of other diseases of the circulatory system: Secondary | ICD-10-CM | POA: Diagnosis not present

## 2014-08-31 DIAGNOSIS — I5033 Acute on chronic diastolic (congestive) heart failure: Secondary | ICD-10-CM

## 2014-08-31 DIAGNOSIS — R0600 Dyspnea, unspecified: Secondary | ICD-10-CM

## 2014-08-31 DIAGNOSIS — R0609 Other forms of dyspnea: Secondary | ICD-10-CM

## 2014-08-31 DIAGNOSIS — I5041 Acute combined systolic (congestive) and diastolic (congestive) heart failure: Secondary | ICD-10-CM | POA: Insufficient documentation

## 2014-08-31 DIAGNOSIS — R609 Edema, unspecified: Secondary | ICD-10-CM | POA: Diagnosis not present

## 2014-08-31 LAB — BASIC METABOLIC PANEL WITH GFR
BUN: 14 mg/dL (ref 6–23)
CO2: 26 meq/L (ref 19–32)
Calcium: 9 mg/dL (ref 8.4–10.5)
Chloride: 103 meq/L (ref 96–112)
Creatinine, Ser: 1.27 mg/dL (ref 0.40–1.50)
GFR: 73.62 mL/min
Glucose, Bld: 257 mg/dL — ABNORMAL HIGH (ref 70–99)
Potassium: 3.8 meq/L (ref 3.5–5.1)
Sodium: 135 meq/L (ref 135–145)

## 2014-08-31 LAB — BRAIN NATRIURETIC PEPTIDE: Pro B Natriuretic peptide (BNP): 234 pg/mL — ABNORMAL HIGH (ref 0.0–100.0)

## 2014-08-31 MED ORDER — FUROSEMIDE 40 MG PO TABS: 40.0000 mg | ORAL_TABLET | Freq: Two times a day (BID) | ORAL | Status: DC

## 2014-08-31 MED ORDER — AMIODARONE HCL 200 MG PO TABS
200.0000 mg | ORAL_TABLET | Freq: Two times a day (BID) | ORAL | Status: DC
Start: 2014-08-31 — End: 2014-09-13

## 2014-08-31 MED ORDER — POTASSIUM CHLORIDE CRYS ER 20 MEQ PO TBCR: 20.0000 meq | EXTENDED_RELEASE_TABLET | Freq: Every day | ORAL | Status: DC

## 2014-08-31 NOTE — Progress Notes (Signed)
Patient ID: Anthony [name], male   DOB: [birth year], 64 y.o.   MRN: [id number]    08/31/2014  PCP: Christa See, MD  Chief complain: DOE  Primary Cardiologist: Dr. Verl Blalock now Dr. Meda Coffee  HPI:  64 year-old male with previous history of acute MI and in 2001 , congestive heart failure may be diastolic, coronary artery disease ( 2001 cardiac cath revealed nonobstructive coronary disease with an EF of 60%).  In 2003 patient again had chest pain underwent cardiac cath with successful stenting of the circumflex lesion extending into the second obtuse marginal branch with a drug-eluting stent.  Myoview 2007 with no ischemia and EF of 59%.   Pt has with him an EKG from 2001 that also has a fib.  All other EKGs in the computer are with SR.    Pt was at opthamologist on Thursday the 11th of June and he was found to be in A. fib with RVR.  The office was called and it our office instructed the patient to go to the emergency room. In the initial EKG heart rate was 141.  In the emergency room his rate was improved control 110 though A. fib continued. His troponin was negative the ER note states patient will followup in the office.  He underwent a stress test in July that was negative for prior MI or ischemia. He denies any further chest pain or SOB. No palpitations. No syncope. Some orthostatic hypotension when he stands up.  He is complaint with his meds.  His Chads2Vas2  score is 4. No bruising from Eliquis.  08/31/14 - the patient is coming after 6 months, he is complaining of mild worsening DOE and LE edema, he has 2 pillow orthopnea, no PND. No chest pain, palpitations or syncope.    Allergies  Allergen Reactions  . Enalapril Maleate Cough    REACTION: cough    Current Outpatient Prescriptions  Medication Sig Dispense Refill  . amLODipine (NORVASC) 10 MG tablet Take 10 mg by mouth daily.    Marland Kitchen amLODipine (NORVASC) 10 MG tablet TAKE 1 TABLET (10 MG TOTAL) BY MOUTH AT BEDTIME. 90 tablet 3  .  carvedilol (COREG) 25 MG tablet Take 25 mg by mouth 2 (two) times daily with a meal.    . doxycycline (VIBRA-TABS) 100 MG tablet Take 1 tablet (100 mg total) by mouth 2 (two) times daily. X 10 days 20 tablet 0  . ELIQUIS 5 MG TABS tablet TAKE 1 TABLET (5 MG TOTAL) BY MOUTH 2 (TWO) TIMES DAILY. 60 tablet 6  . furosemide (LASIX) 40 MG tablet TAKE 1 TABLET (40 MG TOTAL) BY MOUTH DAILY. 90 tablet 1  . insulin NPH-regular Human (NOVOLIN 70/30) (70-30) 100 UNIT/ML injection Inject 30-60 Units into the skin 2 (two) times daily. 60 Units AM 30 Units PM    . isosorbide mononitrate (IMDUR) 60 MG 24 hr tablet TAKE 1 TABLET (60 MG TOTAL) BY MOUTH DAILY. 90 tablet 3  . KLOR-CON M10 10 MEQ tablet TAKE 2 TABLETS BY MOUTH ONCE DAILY 180 tablet 3  . KLOR-CON M10 10 MEQ tablet TAKE 2 TABLETS BY MOUTH ONCE DAILY 180 tablet 3  . losartan (COZAAR) 100 MG tablet TAKE 1 TABLET (100 MG TOTAL) BY MOUTH DAILY. 90 tablet 3  . metFORMIN (GLUCOPHAGE) 1000 MG tablet TAKE 1 TABLET (1,000 MG TOTAL) BY MOUTH 2 (TWO) TIMES DAILY WITH A MEAL. 180 tablet 1  . Multiple Vitamin (MULTIVITAMIN) tablet Take 1 tablet by mouth daily.      Marland Kitchen  nitroGLYCERIN (NITROSTAT) 0.4 MG SL tablet Place 0.4 mg under the tongue every 5 (five) minutes as needed. If no relief by 3rd tab, call 911     . NOVOLIN 70/30 (70-30) 100 UNIT/ML injection INJECT 60 UNITS INTO THE SKIN 2 (TWO) TIMES DAILY. 110 mL 1  . Omega 3 1000 MG CAPS Take 3 capsules by mouth daily.    Marland Kitchen oxymorphone (OPANA) 5 MG tablet Take 5-10 mg by mouth every 4 (four) hours as needed for pain (max 5 tablets daily).     Marland Kitchen PARoxetine (PAXIL) 40 MG tablet Take 1 tablet (40 mg total) by mouth every morning. 90 tablet 3  . potassium chloride (K-DUR,KLOR-CON) 10 MEQ tablet Take 20 mEq by mouth daily.    . ranitidine (ZANTAC) 150 MG tablet Take 150 mg by mouth daily.    . simvastatin (ZOCOR) 20 MG tablet Take 1 tablet (20 mg total) by mouth at bedtime. 90 tablet 3  . simvastatin (ZOCOR) 20 MG tablet  TAKE 1 TABLET (20 MG TOTAL) BY MOUTH AT BEDTIME. 90 tablet 3   No current facility-administered medications for this visit.   Past Medical History  Diagnosis Date  . Diabetes mellitus   . Arthritis   . CHF (congestive heart failure)   . Hypertension   . Hyperlipidemia   . Depression   . Erectile dysfunction   . Onychomycosis   . GERD (gastroesophageal reflux disease)   . Edema of extremities   . Myocardial infarction 2000&2001  . Obstructive apnea   . Unspecified sleep apnea 02/22/2013   Past Surgical History  Procedure Laterality Date  . Angioplasty  2001    stent x 1  . Toe amputation Left 2012  . Colonoscopy  2000?    negative   [id number] colds or fevers, no weight changes Skin:no rashes or ulcers HEENT:no blurred vision, no congestion CV:see HPI PUL:see HPI GI:no diarrhea constipation or melena, no indigestion GU:no hematuria, no dysuria MS:no joint pain, no claudication Neuro:no syncope,+ lightheadedness Endo:+ diabetes has been stable., no thyroid disease  Wt Readings from Last 3 Encounters:  07/22/14 231 lb 1.6 oz (104.826 kg)  05/11/14 221 lb (100.245 kg)  04/04/14 224 lb (101.606 kg)   PHYSICAL EXAM There were no vitals taken for this visit. General:Pleasant affect, NAD Skin:Warm and dry, brisk capillary refill HEENT:normocephalic, sclera clear, mucus membranes moist Neck:supple, no JVD, no bruits , no adenopathy Heart:S1S2 irreg irreg rapid without murmur, gallup, rub or click Lungs:clear without rales, rhonchi, or wheezes [id number], non tender, + BS, do not palpate liver spleen or masses Ext: lower ext edema B/L 1+, 2+ radial pulses Neuro:alert and oriented, MAE, follows commands, + facial symmetry  EKG:A fib with RVR at 112  Septal infarct, inferior infarct  Lexiscan nuclear stress test: 12/01/2013  Impression Exercise Capacity: Lexiscan with no exercise. BP Response: Normal blood pressure response. Clinical Symptoms: There is dyspnea. ECG  Impression: No significant ECG changes with Lexiscan. Comparison with Prior Nuclear Study: No images to compare  Overall Impression: Low risk stress nuclear study with what  appears to be a small area of fixed perfusion defect that may be  consistent with a prior apical-lateral infarction.. No evidence  of ischemia.  TTE: 02/23/2014 - Left ventricle: The cavity size was normal. Wall thickness was increased in a pattern of moderate LVH. Systolic function was normal. The estimated ejection fraction was in the range of 55% to 60%. Wall motion was normal; there were no regional wall motion abnormalities. - Left  atrium: The atrium was severely dilated. - Right atrium: The atrium was moderately dilated.     ASSESSMENT AND PLAN  1. Atrial fibrillation with RVR  Found to be in A. fib with RVR when he went to have his cataracts done in June 2015. However the patient states that he has been in a-fib at his hospitalization in 2001. Chads2Vasc2 score is 4.  He d/c ed his digoxin as he had significant side effects. Now tachycardic and in heart failure, refuses to try digoxin again. Coreq at max dose 25 mg po BID, we will add amiodarone 200 mg po BID. On Eliquis.   2. Acute on chronic CHF - gained 20 lbs in 6 months- Increase lasix to 40 mg po BID and KCl to 20 mEq po daily.   3. CORONARY, ARTERIOSCLEROSIS  2003 with stent to the circumflex stenosis extending into the second obtuse marginal branch with DES. Last nuclear stress test July 2015 negative for ischemia   4.  DM (diabetes mellitus), type 2, uncontrolled  Uncontrolled at times, insulin dependent   Follow up in 2 weeks, check BMP and BNP today.  Dorothy Spark 08/31/2014

## 2014-08-31 NOTE — Patient Instructions (Signed)
Your physician has recommended you make the following change in your medication:   START TAKING AMIODARONE 200 MG TWICE DAILY   INCREASE YOUR LASIX TO 40 MG TWICE DAILY-  TAKE ONE TAB IN THE AM AND ONE TAB AT 2 PM  INCREASE YOUR POTASSIUM CHLORIDE TO 20 mEq ONCE DAILY    Your physician recommends that you return for lab work in: TODAY---BMET AND BNP    Your physician recommends that you schedule a follow-up appointment in: 2 WEEKS WITH DR NELSON---OK TO DOUBLE BOOK ON DR NELSON'S SCHEDULE FOR 09/12/14.

## 2014-09-08 ENCOUNTER — Encounter: Payer: Self-pay | Admitting: Podiatry

## 2014-09-08 ENCOUNTER — Ambulatory Visit (INDEPENDENT_AMBULATORY_CARE_PROVIDER_SITE_OTHER): Payer: BC Managed Care – PPO | Admitting: Podiatry

## 2014-09-08 DIAGNOSIS — B351 Tinea unguium: Secondary | ICD-10-CM

## 2014-09-08 DIAGNOSIS — M79606 Pain in leg, unspecified: Secondary | ICD-10-CM | POA: Diagnosis not present

## 2014-09-08 NOTE — Progress Notes (Signed)
He presents today with a chief complaint of painful elongated toenails.  Objective: Nails are thick yellow dystrophic with mycotic and painful palpation.  Assessment: Pain limb secondary to onychomycosis 1 through 5 bilateral.  Plan: Debridement of nails 1 through 5 bilateral covered service secondary to pain follow up with him in 3 months.

## 2014-09-12 ENCOUNTER — Encounter: Payer: Self-pay | Admitting: Cardiology

## 2014-09-12 ENCOUNTER — Ambulatory Visit (INDEPENDENT_AMBULATORY_CARE_PROVIDER_SITE_OTHER): Payer: BC Managed Care – PPO | Admitting: Cardiology

## 2014-09-12 VITALS — BP 112/80 | HR 98 | Ht 73.0 in | Wt 229.2 lb

## 2014-09-12 DIAGNOSIS — I5033 Acute on chronic diastolic (congestive) heart failure: Secondary | ICD-10-CM

## 2014-09-12 DIAGNOSIS — E1165 Type 2 diabetes mellitus with hyperglycemia: Secondary | ICD-10-CM

## 2014-09-12 DIAGNOSIS — I10 Essential (primary) hypertension: Secondary | ICD-10-CM

## 2014-09-12 DIAGNOSIS — IMO0002 Reserved for concepts with insufficient information to code with codable children: Secondary | ICD-10-CM

## 2014-09-12 DIAGNOSIS — I4891 Unspecified atrial fibrillation: Secondary | ICD-10-CM | POA: Diagnosis not present

## 2014-09-12 LAB — TSH: TSH: 4.01 u[IU]/mL (ref 0.35–4.50)

## 2014-09-12 LAB — BASIC METABOLIC PANEL
BUN: 18 mg/dL (ref 6–23)
CO2: 27 mEq/L (ref 19–32)
Calcium: 9 mg/dL (ref 8.4–10.5)
Chloride: 105 mEq/L (ref 96–112)
Creatinine, Ser: 1.39 mg/dL (ref 0.40–1.50)
GFR: 66.33 mL/min (ref >60.00)
Glucose, Bld: 77 mg/dL (ref 70–99)
Potassium: 3.6 mEq/L (ref 3.5–5.1)
Sodium: 139 mEq/L (ref 135–145)

## 2014-09-12 NOTE — Progress Notes (Signed)
Patient ID: Anthony [name], male   DOB: Jul 11, 1950, 64 y.o.   MRN: [id number]    09/12/2014  PCP: Christa See, MD  Chief complain: DOE  Primary Cardiologist: Dr. Verl Blalock now Dr. Meda Coffee  HPI:  64 year-old male with previous history of acute MI and in 2001 , congestive heart failure may be diastolic, coronary artery disease ( 2001 cardiac cath revealed nonobstructive coronary disease with an EF of 60%).  In 2003 patient again had chest pain underwent cardiac cath with successful stenting of the circumflex lesion extending into the second obtuse marginal branch with a drug-eluting stent.  Myoview 2007 with no ischemia and EF of 59%.   Pt has with him an EKG from 2001 that also has a fib.  All other EKGs in the computer are with SR.    Pt was at opthamologist on Thursday the 11th of June and he was found to be in A. fib with RVR.  The office was called and it our office instructed the patient to go to the emergency room. In the initial EKG heart rate was 141.  In the emergency room his rate was improved control 110 though A. fib continued. His troponin was negative the ER note states patient will followup in the office.  He underwent a stress test in July that was negative for prior MI or ischemia. He denies any further chest pain or SOB. No palpitations. No syncope. Some orthostatic hypotension when he stands up.  He is complaint with his meds.  His Chads2Vas2  score is 4. No bruising from Eliquis.  08/31/14 - the patient is coming after 6 months, he is complaining of mild worsening DOE and LE edema, he has 2 pillow orthopnea, no PND. No chest pain, palpitations or syncope.   09/12/2014 - no significant difference since two weeks ago, minimal weight loss, complaint to his meds, slower HR now, improvement in SOB.    Allergies  Allergen Reactions  . Enalapril Maleate Cough    REACTION: cough    Current Outpatient Prescriptions  Medication Sig Dispense Refill  . amiodarone (PACERONE) 200  MG tablet Take 1 tablet (200 mg total) by mouth 2 (two) times daily. 180 tablet 3  . amLODipine (NORVASC) 10 MG tablet Take 10 mg by mouth daily.    . carvedilol (COREG) 25 MG tablet Take 25 mg by mouth 2 (two) times daily with a meal.    . ELIQUIS 5 MG TABS tablet TAKE 1 TABLET (5 MG TOTAL) BY MOUTH 2 (TWO) TIMES DAILY. 60 tablet 6  . furosemide (LASIX) 40 MG tablet Take 1 tablet (40 mg total) by mouth 2 (two) times daily. TAKE 1 TAB IN THE AM AND 1 TAB AT 2 PM 180 tablet 3  . insulin NPH-regular Human (NOVOLIN 70/30) (70-30) 100 UNIT/ML injection Inject 30-60 Units into the skin 2 (two) times daily. 60 Units AM 30 Units PM    . isosorbide mononitrate (IMDUR) 60 MG 24 hr tablet TAKE 1 TABLET (60 MG TOTAL) BY MOUTH DAILY. 90 tablet 3  . losartan (COZAAR) 100 MG tablet TAKE 1 TABLET (100 MG TOTAL) BY MOUTH DAILY. 90 tablet 3  . metFORMIN (GLUCOPHAGE) 1000 MG tablet TAKE 1 TABLET (1,000 MG TOTAL) BY MOUTH 2 (TWO) TIMES DAILY WITH A MEAL. 180 tablet 1  . Multiple Vitamin (MULTIVITAMIN) tablet Take 1 tablet by mouth daily.      . nitroGLYCERIN (NITROSTAT) 0.4 MG SL tablet Place 0.4 mg under the tongue every 5 (five) minutes  as needed. If no relief by 3rd tab, call 911     . NOVOLIN 70/30 (70-30) 100 UNIT/ML injection INJECT 60 UNITS INTO THE SKIN 2 (TWO) TIMES DAILY. 110 mL 1  . Omega 3 1000 MG CAPS Take 3 capsules by mouth daily.    Marland Kitchen oxymorphone (OPANA) 5 MG tablet Take 5-10 mg by mouth every 4 (four) hours as needed for pain (max 5 tablets daily).     Marland Kitchen PARoxetine (PAXIL) 40 MG tablet Take 1 tablet (40 mg total) by mouth every morning. 90 tablet 3  . potassium chloride SA (K-DUR,KLOR-CON) 20 MEQ tablet Take 1 tablet (20 mEq total) by mouth daily. 90 tablet 3  . ranitidine (ZANTAC) 150 MG tablet Take 150 mg by mouth daily.    . simvastatin (ZOCOR) 20 MG tablet TAKE 1 TABLET (20 MG TOTAL) BY MOUTH AT BEDTIME. 90 tablet 3   No current facility-administered medications for this visit.   Past Medical  History  Diagnosis Date  . Diabetes mellitus   . Arthritis   . CHF (congestive heart failure)   . Hypertension   . Hyperlipidemia   . Depression   . Erectile dysfunction   . Onychomycosis   . GERD (gastroesophageal reflux disease)   . Edema of extremities   . Myocardial infarction 2000&2001  . Obstructive apnea   . Unspecified sleep apnea 02/22/2013   Past Surgical History  Procedure Laterality Date  . Angioplasty  2001    stent x 1  . Toe amputation Left 2012  . Colonoscopy  2000?    negative   [id number] colds or fevers, no weight changes Skin:no rashes or ulcers HEENT:no blurred vision, no congestion CV:see HPI PUL:see HPI GI:no diarrhea constipation or melena, no indigestion GU:no hematuria, no dysuria MS:no joint pain, no claudication Neuro:no syncope,+ lightheadedness Endo:+ diabetes has been stable., no thyroid disease  Wt Readings from Last 3 Encounters:  08/31/14 229 lb 1.9 oz (103.928 kg)  07/22/14 231 lb 1.6 oz (104.826 kg)  05/11/14 221 lb (100.245 kg)   PHYSICAL EXAM There were no vitals taken for this visit. General:Pleasant affect, NAD Skin:Warm and dry, brisk capillary refill HEENT:normocephalic, sclera clear, mucus membranes moist Neck:supple, no JVD, no bruits , no adenopathy Heart:S1S2 irreg irreg rapid without murmur, gallup, rub or click Lungs:clear without rales, rhonchi, or wheezes [id number], non tender, + BS, do not palpate liver spleen or masses Ext: lower ext edema B/L 1+, 2+ radial pulses Neuro:alert and oriented, MAE, follows commands, + facial symmetry  EKG:A fib with RVR at 112  Septal infarct, inferior infarct  Lexiscan nuclear stress test: 12/01/2013  Impression Exercise Capacity: Lexiscan with no exercise. BP Response: Normal blood pressure response. Clinical Symptoms: There is dyspnea. ECG Impression: No significant ECG changes with Lexiscan. Comparison with Prior Nuclear Study: No images to compare  Overall Impression:  Low risk stress nuclear study with what  appears to be a small area of fixed perfusion defect that may be  consistent with a prior apical-lateral infarction.. No evidence  of ischemia.  TTE: 02/23/2014 - Left ventricle: The cavity size was normal. Wall thickness was increased in a pattern of moderate LVH. Systolic function was normal. The estimated ejection fraction was in the range of 55% to 60%. Wall motion was normal; there were no regional wall motion abnormalities. - Left atrium: The atrium was severely dilated. - Right atrium: The atrium was moderately dilated.     ASSESSMENT AND PLAN  1. Atrial fibrillation with RVR  Found  to be in A. fib with RVR when he went to have his cataracts done in June 2015. However the patient states that he has been in a-fib at his hospitalization in 2001. Chads2Vasc2 score is 4.  He d/c ed his digoxin as he had significant side effects. Tachycardic and in heart failure on 08/31/14, refuses to try digoxin again, on Coreq at max dose 25 mg po BID, added amiodarone 200 mg po BID x 2 weeks, we will decrease to 200 mg po daily. We will check TSH today and in 2 months. On Eliquis.   2. Acute on chronic CHF - gained 10 lbs in 6 months- Increased lasix to 40 mg po BID and KCl to 20 mEq po daily, only 1 LBS weight loss in 2 weeks, we will continue, check BMP today.  BNP only 234 at the last visit.   3. CORONARY, ARTERIOSCLEROSIS  2003 with stent to the circumflex stenosis extending into the second obtuse marginal branch with DES. Last nuclear stress test July 2015 negative for ischemia   4.  DM (diabetes mellitus), type 2, uncontrolled  Uncontrolled at times, insulin dependent   Follow up in 2 months.  Dorothy Spark 09/12/2014

## 2014-09-12 NOTE — Patient Instructions (Signed)
Medication Instructions:   Your physician recommends that you continue on your current medications as directed. Please refer to the Current Medication list given to you today.   Labwork:  TODAY---BMET AND TSH    Follow-Up:  Your physician recommends that you schedule a follow-up appointment in: [place]

## 2014-09-13 ENCOUNTER — Telehealth: Payer: Self-pay | Admitting: *Deleted

## 2014-09-13 DIAGNOSIS — R609 Edema, unspecified: Secondary | ICD-10-CM

## 2014-09-13 DIAGNOSIS — R0609 Other forms of dyspnea: Secondary | ICD-10-CM

## 2014-09-13 DIAGNOSIS — I4891 Unspecified atrial fibrillation: Secondary | ICD-10-CM

## 2014-09-13 DIAGNOSIS — R0600 Dyspnea, unspecified: Secondary | ICD-10-CM

## 2014-09-13 DIAGNOSIS — Z8679 Personal history of other diseases of the circulatory system: Secondary | ICD-10-CM

## 2014-09-13 DIAGNOSIS — I5033 Acute on chronic diastolic (congestive) heart failure: Secondary | ICD-10-CM

## 2014-09-13 MED ORDER — AMIODARONE HCL 200 MG PO TABS: 200.0000 mg | ORAL_TABLET | Freq: Every day | ORAL | Status: DC

## 2014-09-13 NOTE — Telephone Encounter (Signed)
-----   Message from Dorothy Spark, MD sent at 09/13/2014 10:54 AM EDT ----- All labs are normal

## 2014-09-13 NOTE — Telephone Encounter (Signed)
Clarification order obtained from Dr Meda Coffee on pts amiodarone, and how much he is to take on a daily basis.  Per Dr Meda Coffee the pt is to decrease his amiodarone to 200 mg po daily.  Changed this in pts med list.  Pt verbalized understanding and agrees with this plan.

## 2014-09-29 ENCOUNTER — Other Ambulatory Visit: Payer: Self-pay | Admitting: Family Medicine

## 2014-09-29 ENCOUNTER — Telehealth: Payer: Self-pay | Admitting: Cardiology

## 2014-09-29 NOTE — Telephone Encounter (Signed)
Called patient back. Informed him of Dr. Francesca Oman order to stop Amiodarone and that she would re-evaluate him in a month. Encouraged patient to call office with any other questions, concerns or if symptoms do not improve after stopping medication. Patient verbalized understanding.

## 2014-09-29 NOTE — Telephone Encounter (Signed)
Patient states he is still having tiredness, and rapid heart beat and SOB with activity. Patient stated today that his BP is 112/80 and HR is 78. Patient stated he has more worse days then bad day and it all started with Amiodarone, after decreasing medication his symptoms improved and then went back to feeling tired and unable to tolerate activity due to rapid heart rate and SOB. Informed patient that a message would be sent to Dr. Meda Coffee to see about any adjustments to his medications. Will forward to Dr. Meda Coffee for further instructions.

## 2014-09-29 NOTE — Telephone Encounter (Signed)
We can stop, he has an appointment with me in 1 month, I will re-evaluate him then.

## 2014-09-29 NOTE — Telephone Encounter (Signed)
New Message   Patient is calling about medication, the medication that is suppose to slow his heart rate down but he says he feels like it is slowing it down to much and he feels tired   Pt c/o medication issue:  1. Name of Medication: pacerone 2. How are you currently taking this medication (dosage and times per day)? 100mg  daily   3. Are you having a reaction (difficulty breathing--STAT)? SOB and feels tired   4. What is your medication issue? Medication is suppose to slow heart rate down

## 2014-09-29 NOTE — Telephone Encounter (Signed)
Patient states he is wanting to take less Amiodarone, not more. Patient thinks Amiodarone is causing all his symptoms, because that is the only thing that has changed for him.

## 2014-09-29 NOTE — Telephone Encounter (Signed)
I would increase amiodarone to 200 mg po BID and call him in a week to see how is he doing. I would also order chest X ray to rule side effect of amiodarone.

## 2014-10-18 ENCOUNTER — Encounter: Payer: Self-pay | Admitting: Neurology

## 2014-10-18 ENCOUNTER — Ambulatory Visit (INDEPENDENT_AMBULATORY_CARE_PROVIDER_SITE_OTHER): Payer: BC Managed Care – PPO | Admitting: Neurology

## 2014-10-18 VITALS — BP 122/80 | HR 78 | Resp 20 | Ht 74.0 in | Wt 229.0 lb

## 2014-10-18 DIAGNOSIS — G4733 Obstructive sleep apnea (adult) (pediatric): Secondary | ICD-10-CM | POA: Diagnosis not present

## 2014-10-18 DIAGNOSIS — F1190 Opioid use, unspecified, uncomplicated: Secondary | ICD-10-CM | POA: Insufficient documentation

## 2014-10-18 DIAGNOSIS — E669 Obesity, unspecified: Secondary | ICD-10-CM

## 2014-10-18 DIAGNOSIS — Z9989 Dependence on other enabling machines and devices: Principal | ICD-10-CM

## 2014-10-18 NOTE — Addendum Note (Signed)
Addended by: Larey Seat on: 10/18/2014 11:59 AM   Modules accepted: Orders, Medications

## 2014-10-18 NOTE — Patient Instructions (Signed)
Smoking cessation information through "up to date" print out and [facility] .

## 2014-10-18 NOTE — Progress Notes (Signed)
[facility]  Provider:  Larey Seat, M D  Referring Provider: Street, Sharon Mt, * Primary Care Physician:  Christa See, MD  Chief Complaint  Patient presents with  . Follow-up    cpap, rm 10, alone    HPI:  Anthony [name] is a 64 y.o. right handed african [place] male patient , originally seen in 2014  upon referral from Dr. Brien Few  for an evaluation and treatment of possible sleep apnea.  Anthony [name]  referral is dated 12-27 -2013 and was signed by Dr. Marlaine Hind,  His referring physician indicated that the patient has diabetes chronic lower back pain,  radiating to the lower extremities, and that is treated with chronic opiate therapy. The need for opioids in his pain therapy was a  concern to be addressed in a sleep consultation and possible polysomnogram, mainly to rule out that central apneas will be triggered by pain medication. Anthony [name] is 64 years of age and works at [employer] , Engineer, materials trucks, checking in and out of vehicles. Exam of 11:30 PM and falls asleep very quickly, arises at 5 AM.  This  means that he only gets about 5-1/2 hours of sleep on average. He usually doesn't need the alarm he wakes up spontaneously. He has 2-3 times at night bathroom breaks interrupting his  sleep.The patient endorses muscle cramps and lower extremity aches, constipation,  Witnessed  snoring, some depression.He does not feel that he has to much or too little sleep. He did not endorse swelling in his legs, wheezing, hearing loss, incontinence to stool or urine.  No witnessed apneas.  He is a past medical history of hypertension, diabetes, and heart disease, he is an active smoker of 1/2 packs per day but he does not drink any alcohol, denies  any use of illegal drugs. He had nasal facial surgery, it appears like a cleft lip repair , but he states this is following an accident in his youth.  He is excessive sleepy in daytime, drowsy when not  physically active and stimulated.  Epworth 14 , FSS is 23, GDS 6 points.    Interval history :  04-04-14  The patient has meanwhile retired form his job at [employer].  Mr. Evetts underwent a split night polysomnography on 03-03-13. At the time he endorsed the Epworth Sleepiness Scale at 14 points the geriatric depression score at 6 points on the fatigue severity score at 23 points. His AHI was 34.5 and RDI was 37.3. There was a strong REM accentuation of his apnea at an AHI of 90 during REM sleep. Supine  apnea was also 65.2/hr. He had only 9 minutes of desaturations at all below 90% his heart rates showed a sinus rhythm with intermittent PVCs. There were no significant periodic limb movements noted. The patient was titrated to 7 cm water pressure on CPAP,  his AHI became 0.0,  he is using a nasal pillow mask -called swift FX. Plan Continue at 7cm water CPAP , with current mask.  Improve compliance -  establish routines.  He is encouraged to put the CPAP on , when he is at rest and ready to sleep.  This includes naps. RV in 12 month with machine and Epworth . May see NP/ Download, compliance visit. The download data today confirms that the patient has used the machine for 30 days out of 30 days. 18 of those days over 4 hours. His average use of 4 hours 14 minutes  the machine is set at 7 cm water pressure with 2 cm EPR is residual AHI is 3.1 he has a moderate air leak. Overall his apnea is very well controlled but the patient can improve on compliance by either using his machine at least 30 more minutes each night and also making sure that he puts the mask/ switches the machine on right away when he goes to bed. He still has nocturia , he can sleep supine now, with CPAP. He sleeps on average 7 hours now, but why is his compliance time so meager?  We discussed strategies to  improve compliance.  We discussed the results and explained the risks associated with untreated sleep apnea. He is aware of his  irregular heart beat.    10-18-14   Anthony [name]  has been followed in our practice for the last 2 years after he had a split-night polysomnography on 03-03-13 ordered by Dr. Brien Few his AHI was 34.9 there was a strong supine component, supine AHI was 65.2 and there was a strong REM component at 90. There were not many desaturations. There were no central apneas. It was decided that the patient would need CPAP and he has been using CPAP at 7 cm water. Today the patient's compliance record dated 10-13-14 used to machine 27 out of 30 days with a 90% compliance and 77% compliance for over 4 hours of use his average user time is 4 hours and 21 minutes. His CPAP is still set at 7 cm water was 27 m EPR his AHI is 1.5. This is a good result and warrants no adjustments he does have a at times high air leak which may be when the mask slips. It seems to be that his air mostly leaks out of the right nostril. Nasal pillows and feels that these are comfortable.  No change in settings, keeps current nasal pillow, he uses heated humidity. Rv 12 month   Review of Systems: Epworth is now 7 from  9 points, FSS is  18 as last time, 18 points on CPAP.  Dysphonia.  He avoids daytime naps.  He still feels an urge to go to sleep.  The patient gets about 6 hours of nocturnal sleep he reports. He usually feels refreshed and restored in the morning after using CPAP. He has not been told that she snores since using CPAP. 021. Nocturia improved a lot since being on CPAP.     History   Social History  . Marital Status: Married    Spouse Name: [name]  . Number of Children: 2  . Years of Education: GED   Occupational History  .    [employer]     [employer]   Social History Main Topics  . Smoking status: Current Some Day Smoker -- 0.25 packs/day for 45 years    Types: Cigarettes    Start date: 05/28/1967    Last Attempt to Quit: 03/04/2010  . Smokeless tobacco: Never Used  . Alcohol Use: No     Comment: quit in  1993  . Drug Use: No  . Sexual Activity: Not on file   Other Topics Concern  . Not on file   Social History Narrative   Patient is right handed, consumes caffeine rarely. Patient resides in home with wife.    Family History  Problem Relation Age of Onset  . Colon cancer Neg Hx   . Stomach cancer Neg Hx   . Heart attack Mother   . Heart attack  Father     Past Medical History  Diagnosis Date  . Diabetes mellitus   . Arthritis   . CHF (congestive heart failure)   . Hypertension   . Hyperlipidemia   . Depression   . Erectile dysfunction   . Onychomycosis   . GERD (gastroesophageal reflux disease)   . Edema of extremities   . Myocardial infarction 2000&2001  . Obstructive apnea   . Unspecified sleep apnea 02/22/2013    Past Surgical History  Procedure Laterality Date  . Angioplasty  2001    stent x 1  . Toe amputation Left 2012  . Colonoscopy  2000?    negative    Current Outpatient Prescriptions  Medication Sig Dispense Refill  . amLODipine (NORVASC) 10 MG tablet Take 10 mg by mouth daily.    . carvedilol (COREG) 25 MG tablet Take 25 mg by mouth 2 (two) times daily with a meal.    . ELIQUIS 5 MG TABS tablet TAKE 1 TABLET (5 MG TOTAL) BY MOUTH 2 (TWO) TIMES DAILY. 60 tablet 6  . furosemide (LASIX) 40 MG tablet Take 1 tablet (40 mg total) by mouth 2 (two) times daily. TAKE 1 TAB IN THE AM AND 1 TAB AT 2 PM 180 tablet 3  . insulin NPH-regular Human (NOVOLIN 70/30) (70-30) 100 UNIT/ML injection Inject 30-60 Units into the skin 2 (two) times daily. 60 Units AM 30 Units PM    . isosorbide mononitrate (IMDUR) 60 MG 24 hr tablet TAKE 1 TABLET (60 MG TOTAL) BY MOUTH DAILY. 90 tablet 3  . losartan (COZAAR) 100 MG tablet TAKE 1 TABLET (100 MG TOTAL) BY MOUTH DAILY. 90 tablet 2  . metFORMIN (GLUCOPHAGE) 1000 MG tablet TAKE 1 TABLET (1,000 MG TOTAL) BY MOUTH 2 (TWO) TIMES DAILY WITH A MEAL. 180 tablet 1  . Multiple Vitamin (MULTIVITAMIN) tablet Take 1 tablet by mouth daily.       . nitroGLYCERIN (NITROSTAT) 0.4 MG SL tablet Place 0.4 mg under the tongue every 5 (five) minutes as needed. If no relief by 3rd tab, call 911     . NOVOLIN 70/30 (70-30) 100 UNIT/ML injection INJECT 60 UNITS INTO THE SKIN 2 (TWO) TIMES DAILY. 110 mL 1  . Omega 3 1000 MG CAPS Take 3 capsules by mouth daily.    [employer] oxymorphone (OPANA) 5 MG tablet Take 5-10 mg by mouth every 4 (four) hours as needed for pain (max 5 tablets daily).     [employer] PARoxetine (PAXIL) 40 MG tablet Take 1 tablet (40 mg total) by mouth every morning. 90 tablet 3  . potassium chloride SA (K-DUR,KLOR-CON) 20 MEQ tablet Take 1 tablet (20 mEq total) by mouth daily. 90 tablet 3  . ranitidine (ZANTAC) 150 MG tablet Take 150 mg by mouth daily.    . simvastatin (ZOCOR) 20 MG tablet TAKE 1 TABLET (20 MG TOTAL) BY MOUTH AT BEDTIME. 90 tablet 3  . amiodarone (PACERONE) 200 MG tablet Take 1 tablet (200 mg total) by mouth daily. (Patient not taking: Reported on 10/18/2014) 180 tablet 3   No current facility-administered medications for this visit.    Allergies as of 10/18/2014 - Review Complete 10/18/2014  Allergen Reaction Noted  . Enalapril maleate Cough 08/19/2005    Vitals: BP 122/80 mmHg  Pulse 78  Resp 20  Ht 6\' 2"  (1.88 m)  Wt 229 lb (103.874 kg)  BMI 29.39 kg/m2 Last Weight:  Wt Readings from Last 1 Encounters:  10/18/14 229 lb (103.874 kg)   Last  Height:   Ht Readings from Last 1 Encounters:  10/18/14 6\' 2"  (1.88 m)    Physical exam:  General: The patient is awake, alert and appears not in acute distress. The patient is well groomed. Head: Normocephalic, atraumatic. Neck is supple. Mallampati 3, neck circumference: 16.5 inches.  No nasal septal  deviation.  Cardiovascular:  Regular rate and rhythm  without  murmurs or carotid bruit, and without distended neck veins. Respiratory: Lungs are clear to auscultation. Skin:  Without evidence of edema, or rash Trunk: BMI is elevated, but the patient has normal  posture.  Neurologic exam : The patient is awake and alert, oriented to place and time. Memory subjective described as intact.  Cranial nerves: Pupils are equal and briskly reactive to light. Extraocular movements  in vertical and horizontal planes intact and without nystagmus. Visual fields by finger perimetry are intact. Hearing to finger rub intact.  Facial sensation intact to fine touch. Facial motor strength is symmetric and tongue and uvula move midline.  Motor exam:   Normal tone and normal muscle bulk - symmetric  strength in all extremities. Strong grip. He reports trouble with screwdriver use. Cramping at the wrist. Spasms  In lower extremities. No RLS.   Sensory:  Fine touch, pinprick and vibration were tested in all extremities. Reduced pin prick, vibration and filament touch in both feet.  Coordination: Rapid alternating movements in the fingers/hands is tested and normal. Finger-to-nose maneuverwithout evidence  of ataxia, dysmetria or tremor.  Gait and station: intact.    Assessment:  / PLAN  After physical and neurologic examination, review of  pre-existing records. 1) confirmed OSA, central apneas not noted,   Compliance improved , EDS is improved as documented by epworth, nocturia and snoring are treated on CPAP.  2) CAD-  He had 2 heart atatcks, 2 stents were placed in 2010-2001. High risk for OSA , BMI elevated and higher grade mallompati . 3) tobacco use He smokes 8- 1 cig a day ,. He has no prolonged desaturations during the sleep study 2014.  4) obesity  5) chronic pain,  Narcotic opiate therapy reduced to three doses a day,  Followed actively by Dr Brien Few.   Plan:  Continue using your CPAP. Rv in 12 month.

## 2014-10-28 ENCOUNTER — Ambulatory Visit: Payer: BC Managed Care – PPO | Admitting: Cardiology

## 2014-10-31 ENCOUNTER — Ambulatory Visit (INDEPENDENT_AMBULATORY_CARE_PROVIDER_SITE_OTHER): Payer: BC Managed Care – PPO | Admitting: Cardiology

## 2014-10-31 ENCOUNTER — Encounter: Payer: Self-pay | Admitting: Cardiology

## 2014-10-31 VITALS — BP 110/80 | HR 80 | Ht 74.0 in | Wt 230.0 lb

## 2014-10-31 DIAGNOSIS — R0600 Dyspnea, unspecified: Secondary | ICD-10-CM

## 2014-10-31 DIAGNOSIS — I5033 Acute on chronic diastolic (congestive) heart failure: Secondary | ICD-10-CM

## 2014-10-31 DIAGNOSIS — I10 Essential (primary) hypertension: Secondary | ICD-10-CM

## 2014-10-31 DIAGNOSIS — R0609 Other forms of dyspnea: Secondary | ICD-10-CM

## 2014-10-31 DIAGNOSIS — I482 Chronic atrial fibrillation, unspecified: Secondary | ICD-10-CM

## 2014-10-31 LAB — BASIC METABOLIC PANEL WITH GFR
BUN: 10 mg/dL (ref 6–23)
CO2: 27 meq/L (ref 19–32)
Calcium: 8.8 mg/dL (ref 8.4–10.5)
Chloride: 106 meq/L (ref 96–112)
Creatinine, Ser: 1.25 mg/dL (ref 0.40–1.50)
GFR: 74.94 mL/min
Glucose, Bld: 96 mg/dL (ref 70–99)
Potassium: 3.5 meq/L (ref 3.5–5.1)
Sodium: 139 meq/L (ref 135–145)

## 2014-10-31 LAB — TSH: TSH: 2.05 u[IU]/mL (ref 0.35–4.50)

## 2014-10-31 NOTE — Patient Instructions (Signed)
Medication Instructions:  Your physician recommends that you continue on your current medications as directed. Please refer to the Current Medication list given to you today.  Labwork: Your physician recommends that you today. BMET and TSH   Testing/Procedures: NONE  Follow-Up: Your physician wants you to follow-up in: 6 months with Dr. Meda Coffee. You will receive a reminder letter in the mail two months in advance. If you don't receive a letter, please call our office to schedule the follow-up appointment.  Any Other Special Instructions Will Be Listed Below (If Applicable).

## 2014-10-31 NOTE — Progress Notes (Signed)
Patient ID: Anthony [name], male   DOB: November 11, 1950, 64 y.o.   MRN: [id number] Patient ID: Anthony [name], male   DOB: 20-Nov-1950, 64 y.o.   MRN: [id number]    10/31/2014  PCP: Christa See, MD  Chief complain: DOE  Primary Cardiologist: Dr. Verl Blalock now Dr. Meda Coffee  HPI:  64 year-old male with previous history of acute MI and in 2001 , congestive heart failure may be diastolic, coronary artery disease ( 2001 cardiac cath revealed nonobstructive coronary disease with an EF of 60%).  In 2003 patient again had chest pain underwent cardiac cath with successful stenting of the circumflex lesion extending into the second obtuse marginal branch with a drug-eluting stent.  Myoview 2007 with no ischemia and EF of 59%.   Pt has with him an EKG from 2001 that also has a fib.  All other EKGs in the computer are with SR.    Pt was at opthamologist on Thursday the 11th of June and he was found to be in A. fib with RVR.  The office was called and it our office instructed the patient to go to the emergency room. In the initial EKG heart rate was 141.  In the emergency room his rate was improved control 110 though A. fib continued. His troponin was negative the ER note states patient will followup in the office.  He underwent a stress test in July that was negative for prior MI or ischemia. He denies any further chest pain or SOB. No palpitations. No syncope. Some orthostatic hypotension when he stands up.  He is complaint with his meds.  His Chads2Vas2  score is 4. No bruising from Eliquis.  08/31/14 - the patient is coming after 6 months, he is complaining of mild worsening DOE and LE edema, he has 2 pillow orthopnea, no PND. No chest pain, palpitations or syncope.   10/31/2014 -The patient reports improvement of LE edema and SOB, he sleeps well, denies orthopnea or PND, denies palpitations or syncope. His HR is now in 70-80 without amiodarone that he stopped taking as he felt it was making him tired.      Allergies  Allergen Reactions  . Enalapril Maleate Cough    REACTION: cough    Current Outpatient Prescriptions  Medication Sig Dispense Refill  . amLODipine (NORVASC) 10 MG tablet Take 10 mg by mouth daily.    . carvedilol (COREG) 25 MG tablet Take 25 mg by mouth 2 (two) times daily with a meal.    . ELIQUIS 5 MG TABS tablet TAKE 1 TABLET (5 MG TOTAL) BY MOUTH 2 (TWO) TIMES DAILY. 60 tablet 6  . furosemide (LASIX) 40 MG tablet Take 1 tablet (40 mg total) by mouth 2 (two) times daily. TAKE 1 TAB IN THE AM AND 1 TAB AT 2 PM 180 tablet 3  . insulin NPH-regular Human (NOVOLIN 70/30) (70-30) 100 UNIT/ML injection Inject 30-60 Units into the skin 2 (two) times daily. 60 Units AM 30 Units PM    . isosorbide mononitrate (IMDUR) 60 MG 24 hr tablet TAKE 1 TABLET (60 MG TOTAL) BY MOUTH DAILY. 90 tablet 3  . losartan (COZAAR) 100 MG tablet TAKE 1 TABLET (100 MG TOTAL) BY MOUTH DAILY. 90 tablet 2  . metFORMIN (GLUCOPHAGE) 1000 MG tablet TAKE 1 TABLET (1,000 MG TOTAL) BY MOUTH 2 (TWO) TIMES DAILY WITH A MEAL. 180 tablet 1  . Multiple Vitamin (MULTIVITAMIN) tablet Take 1 tablet by mouth daily.      . nitroGLYCERIN (NITROSTAT)  0.4 MG SL tablet Place 0.4 mg under the tongue every 5 (five) minutes as needed. If no relief by 3rd tab, call 911     . Omega 3 1000 MG CAPS Take 3 capsules by mouth daily.    Marland [employer] oxymorphone (OPANA) 5 MG tablet Take 5-10 mg by mouth every 8 (eight) hours. Per Dr Brien Few    . PARoxetine (PAXIL) 40 MG tablet Take 1 tablet (40 mg total) by mouth every morning. 90 tablet 3  . potassium chloride (K-DUR) 10 MEQ tablet Take 20 mEq by mouth every morning.    . potassium chloride SA (K-DUR,KLOR-CON) 20 MEQ tablet Take 1 tablet (20 mEq total) by mouth daily. (Patient taking differently: Take 20 mEq by mouth at bedtime. ) 90 tablet 3  . ranitidine (ZANTAC) 150 MG tablet Take 150 mg by mouth daily.    . simvastatin (ZOCOR) 20 MG tablet TAKE 1 TABLET (20 MG TOTAL) BY MOUTH AT BEDTIME. 90  tablet 3   No current facility-administered medications for this visit.   Past Medical History  Diagnosis Date  . Diabetes mellitus   . Arthritis   . CHF (congestive heart failure)   . Hypertension   . Hyperlipidemia   . Depression   . Erectile dysfunction   . Onychomycosis   . GERD (gastroesophageal reflux disease)   . Edema of extremities   . Myocardial infarction 2000&2001  . Obstructive apnea   . Unspecified sleep apnea 02/22/2013   Past Surgical History  Procedure Laterality Date  . Angioplasty  2001    stent x 1  . Toe amputation Left 2012  . Colonoscopy  2000?    negative   [id number] colds or fevers, no weight changes Skin:no rashes or ulcers HEENT:no blurred vision, no congestion CV:see HPI PUL:see HPI GI:no diarrhea constipation or melena, no indigestion GU:no hematuria, no dysuria MS:no joint pain, no claudication Neuro:no syncope,+ lightheadedness Endo:+ diabetes has been stable., no thyroid disease  Wt Readings from Last 3 Encounters:  10/31/14 230 lb (104.327 kg)  10/18/14 229 lb (103.874 kg)  09/12/14 229 lb 3.2 oz (103.964 kg)   PHYSICAL EXAM BP 110/80 mmHg  Pulse 80  Ht '6\' 2"'$  (1.88 m)  Wt 230 lb (104.327 kg)  BMI 29.52 kg/m2 General:Pleasant affect, NAD Skin:Warm and dry, brisk capillary refill HEENT:normocephalic, sclera clear, mucus membranes moist Neck:supple, no JVD, no bruits , no adenopathy Heart:S1S2 irreg irreg rapid without murmur, gallup, rub or click Lungs:clear without rales, rhonchi, or wheezes [id number], non tender, + BS, do not palpate liver spleen or masses Ext: lower ext edema B/L 1+, 2+ radial pulses Neuro:alert and oriented, MAE, follows commands, + facial symmetry  EKG:A fib with RVR at 112  Septal infarct, inferior infarct  Lexiscan nuclear stress test: 12/01/2013  Impression Exercise Capacity: Lexiscan with no exercise. BP Response: Normal blood pressure response. Clinical Symptoms: There is dyspnea. ECG  Impression: No significant ECG changes with Lexiscan. Comparison with Prior Nuclear Study: No images to compare  Overall Impression: Low risk stress nuclear study with what  appears to be a small area of fixed perfusion defect that may be  consistent with a prior apical-lateral infarction.. No evidence  of ischemia.  TTE: 02/23/2014 - Left ventricle: The cavity size was normal. Wall thickness was increased in a pattern of moderate LVH. Systolic function was normal. The estimated ejection fraction was in the range of 55% to 60%. Wall motion was normal; there were no regional wall motion abnormalities. - Left atrium: The  atrium was severely dilated. - Right atrium: The atrium was moderately dilated.     ASSESSMENT AND PLAN  1. Atrial fibrillation with RVR  Found to be in A. fib with RVR when he went to have his cataracts done in June 2015. However the patient states that he has been in a-fib at his hospitalization in 2001. Chads2Vasc2 score is 4.  He d/c ed his digoxin and amiodarone as he had significant side effects. Now rate controlled. Tachycardic and in heart failure on 08/31/14, refuses to try digoxin again, on Coreq at max dose 25 mg po BID, added amiodarone 200 mg po BID x 2 weeks, we will decrease to 200 mg po daily. We will recheck TSH today, normal in April. On Eliquis.   2. Acute on chronic CHF - stable weight, improved LE edema- Increased lasix to 40 mg po BID and KCl to 20 mEq po QAM, and 10 mEq QPM,  check BMP today. BNP only 234 at the last visit.   3. CORONARY, ARTERIOSCLEROSIS  2003 with stent to the circumflex stenosis extending into the second obtuse marginal branch with DES. Last nuclear stress test July 2015 negative for ischemia   4.  DM (diabetes mellitus), type 2, uncontrolled  Uncontrolled at times, insulin dependent   Follow up in 6 months.  Dorothy Spark 10/31/2014

## 2014-11-03 ENCOUNTER — Ambulatory Visit (INDEPENDENT_AMBULATORY_CARE_PROVIDER_SITE_OTHER): Payer: BC Managed Care – PPO | Admitting: Family Medicine

## 2014-11-03 ENCOUNTER — Encounter: Payer: Self-pay | Admitting: Family Medicine

## 2014-11-03 VITALS — BP 107/77 | HR 127 | Temp 97.8°F | Ht 73.0 in | Wt 230.8 lb

## 2014-11-03 DIAGNOSIS — I10 Essential (primary) hypertension: Secondary | ICD-10-CM | POA: Diagnosis not present

## 2014-11-03 DIAGNOSIS — I4891 Unspecified atrial fibrillation: Secondary | ICD-10-CM

## 2014-11-03 DIAGNOSIS — IMO0002 Reserved for concepts with insufficient information to code with codable children: Secondary | ICD-10-CM

## 2014-11-03 DIAGNOSIS — Z720 Tobacco use: Secondary | ICD-10-CM

## 2014-11-03 DIAGNOSIS — E1165 Type 2 diabetes mellitus with hyperglycemia: Secondary | ICD-10-CM | POA: Diagnosis not present

## 2014-11-03 DIAGNOSIS — E7800 Pure hypercholesterolemia, unspecified: Secondary | ICD-10-CM

## 2014-11-03 LAB — POCT GLYCOSYLATED HEMOGLOBIN (HGB A1C): Hemoglobin A1C: 7.3

## 2014-11-03 NOTE — Assessment & Plan Note (Signed)
A: Rate-controlled on Coreg, also on Eliquis. Following closely with cardiology. Intolerant of digoxin. BP well-controlled. TSH normal recently.  P: Continue Coreg and Eliquis. F/u with cardiology as directed / PRN.

## 2014-11-03 NOTE — Progress Notes (Signed)
   Subjective:    Patient ID: Anthony [name], male    DOB: June 23, 1950, 64 y.o.   MRN: [id number]  HPI: Pt presents to clinic for f/u of DM, HNT, HLD.  Does state he has been gaining some weight (up about 20 lbs in 2 years) due to "not being very active" and is taking an over-the-counter diet pill for a few days.  DM - reports compliance with metformin 1000 mg BID and insulin 70/30 - remains only taking insulin 70/30 60 units in the morning and 15 units "about every other night" - pt takes insulin in the AM, metformin mid-day, and second metformin before bed - still not consistently checking blood sugar at home - denies frank symptoms of hypoglycemia; previously had some early AM (2-3 AM) or soon-after-waking tremors, etc  HTN - reports compliance with Norvasc, Coreg, Imdur, losartan, and Lasix - denies chest pain, headache, changes in vision - denies orthopnea or PND - uses CPAP at night - denies frank / persistent LE swelling (continues to have ankle swelling when he is on his feet, that goes away overnight)  HLD - reports compliance with Zocor without new / different body or muscle aches  Atrial fibrillation - seeing cardiology (saw them very recently with no changes), still on Eliquis (off ASA), and rate-controlled with Coreg - stopped digoxin about 6 months ago due to side effects (fatigue, "run-down" feeling, occasional SOB, etc) - has not had persistent tachycardia or palpitations  Pt is a current smoker, continues to cut back as much as able, still smoking up to 10 per day. In addition to the above documentation, pt's PMH, surgical history, FH, and SH all reviewed and updated where appropriate in the EMR. I have also reviewed and updated the pt's allergies and current medications as appropriate.  Review of Systems: As above. Otherwise, full 12-system ROS was reviewed and all negative.      Objective:   Physical Exam BP 107/77 mmHg  Pulse 127  Temp(Src) 97.8 F (36.6 C)  (Oral)  Ht 6\' 1"  (1.854 m)  Wt 230 lb 12.8 oz (104.69 kg)  BMI 30.46 kg/m2 Manual recheck pulse 73 Gen: well-appearing adult male in NAD HEENT: [place]/AT, sclerae/conjunctivae clear, no lid lag, EOMI, PERRLA  MMM, posterior oropharynx clear, no cervical lymphadenopathy  neck supple with full ROM, no masses appreciated; thyroid not enlarged  Cardio: irregularly irregular rhythm; no murmur appreciated; distal pulses intact/symmetric Pulm: CTAB, no wheezes, normal WOB  Abd: soft, nondistended, BS+, no HSM Ext: warm/well-perfused, no cyanosis/clubbing/edema MSK: strength 5/5 in all four extremities, no frank joint deformity/effusion  normal ROM to all four extremities with no point muscle/bony tenderness in spine Neuro/Psych: alert/oriented, sensation grossly intact; normal gait/balance  mood euthymic with congruent affect     Assessment & Plan:  Weight actually fairly stable for the past few months but up from earlier this year / late last year - counseled that OTC diet supplements are probably safe (if not necessarily helpful) but to discontinue them if he has any unusual side effects - strongly suggested improved dietary habits (food choices and portion sizes, especially) and improved exercise in place of pharmaceuticals  See problem list notes, otherwise.

## 2014-11-03 NOTE — Assessment & Plan Note (Signed)
A: BP well-controlled on Coreg, Cozaar, Norvasc, Imdur, and Lasix. Following closely with cardiology as well. No frank HTN-related symptoms or overt heart failure.  P: Continue current medications. Defer labs for now, as pt had normal Cr drawn earlier this week. F/u routinely at next DM-related appt, and with cardiology as instructed.

## 2014-11-03 NOTE — Assessment & Plan Note (Signed)
A: A1c 7.3 despite some irregular compliance with medications (reports taking insulin 70/30 60 units in the morning, metformin around lunch, metformin in the evenings, and insulin 70/30 ~15 units on some days before bed). Has had hypoglycemic episodes in the middle of the night and had stopped taking 70/30 at night at all, but now reports these episodes are resolved. Otherwise, has no specific complaints.  P: Continue metformin, insulin 70/30, and CBG checks at home -- strongly advised improved compliance with these measures. Reported A1c to pt via phone immediately after clinic, today. Advised f/u with new PCP sometime in late August / early September, or sooner if needed.

## 2014-11-03 NOTE — Patient Instructions (Signed)
Thank you for coming in, today!  Everything looks okay. We'll recheck your A1c today. Don't change any medicines but call your pharmacy if you need refills.  Come back to see [place] in 2-4 months. I'm not sure who your regular doctor will be after me, but you can call any time near the end of this month or after the 1st of July to see who it will be. Please feel free to call with any questions or concerns at any time, at [phone]. --Dr. Venetia Maxon

## 2014-11-03 NOTE — Assessment & Plan Note (Signed)
Compliant with Zocor. No frank side effects. Continue without changes. Not due for lipid panel until Sept. Defer CMP given recent BMP. Consider repeat lipid panel or direct LDL with CMP at next visit.

## 2014-11-03 NOTE — Assessment & Plan Note (Signed)
Still smoking and continues to decline formal cessation assistance. Continue to counsel for cessation completely at routine f/u.

## 2014-12-22 ENCOUNTER — Encounter: Payer: Self-pay | Admitting: Podiatry

## 2014-12-22 ENCOUNTER — Ambulatory Visit (INDEPENDENT_AMBULATORY_CARE_PROVIDER_SITE_OTHER): Payer: BC Managed Care – PPO | Admitting: Podiatry

## 2014-12-22 DIAGNOSIS — E119 Type 2 diabetes mellitus without complications: Secondary | ICD-10-CM

## 2014-12-22 DIAGNOSIS — M79609 Pain in unspecified limb: Secondary | ICD-10-CM

## 2014-12-22 DIAGNOSIS — B351 Tinea unguium: Secondary | ICD-10-CM

## 2014-12-22 DIAGNOSIS — M79673 Pain in unspecified foot: Secondary | ICD-10-CM

## 2014-12-22 NOTE — Progress Notes (Signed)
Patient ID: Anthony [name], male   DOB: December 26, 1950, 64 y.o.   MRN: [id number] Complaint:  Visit Type: Patient returns to my office for continued preventative foot care services. Complaint: Patient states" my nails have grown long and thick and become painful to walk and wear shoes" Patient has been diagnosed with DM with neuropathy.. The patient presents for preventative foot care services. No changes to ROS  Podiatric Exam: Vascular: dorsalis pedis and posterior tibial pulses are palpable bilateral. Capillary return is immediate. Temperature gradient is WNL. Skin turgor WNL  Sensorium: Normal Semmes Weinstein monofilament test. Normal tactile sensation bilaterally. Nail Exam: Pt has thick disfigured discolored nails with subungual debris noted bilateral entire nail hallux through fifth toenails Ulcer Exam: There is no evidence of ulcer or pre-ulcerative changes or infection. Orthopedic Exam: Muscle tone and strength are WNL. No limitations in general ROM. No crepitus or effusions noted. Foot type and digits show no abnormalities. Bony prominences are unremarkable. Skin: No Porokeratosis. No infection or ulcers  Diagnosis:  Onychomycosis, , Pain in right toe, pain in left toes  Treatment & Plan Procedures and Treatment: Consent by patient was obtained for treatment procedures. The patient understood the discussion of treatment and procedures well. All questions were answered thoroughly reviewed. Debridement of mycotic and hypertrophic toenails, 1 through 5 bilateral and clearing of subungual debris. No ulceration, no infection noted.  Return Visit-Office Procedure: Patient instructed to return to the office for a follow up visit 3 months for continued evaluation and treatment.

## 2015-01-03 ENCOUNTER — Encounter: Payer: Self-pay | Admitting: Podiatry

## 2015-01-03 ENCOUNTER — Ambulatory Visit (INDEPENDENT_AMBULATORY_CARE_PROVIDER_SITE_OTHER): Payer: BC Managed Care – PPO | Admitting: Podiatry

## 2015-01-03 VITALS — BP 125/94 | HR 96 | Resp 16

## 2015-01-03 DIAGNOSIS — L03031 Cellulitis of right toe: Secondary | ICD-10-CM | POA: Diagnosis not present

## 2015-01-03 DIAGNOSIS — L600 Ingrowing nail: Secondary | ICD-10-CM

## 2015-01-03 MED ORDER — AMOXICILLIN-POT CLAVULANATE 875-125 MG PO TABS: 1.0000 | ORAL_TABLET | Freq: Two times a day (BID) | ORAL | Status: DC

## 2015-01-03 MED ORDER — MUPIROCIN 2 % EX OINT: TOPICAL_OINTMENT | CUTANEOUS | Status: DC

## 2015-01-03 NOTE — Patient Instructions (Signed)

## 2015-01-04 NOTE — Progress Notes (Signed)
He presents today for a chief complaint of a painful nail seems to be draining after having his nails cut a couple weeks ago. He does not recall any blood in his socks last time he was in the office however he states that the toe has become moderately painful and has started to drain. He states that he just wants to be careful because he lost another toe on the other foot in a similar manner.  Objective: Vital signs are stable alert and oriented 3. Pulses are barely palpable bilateral. Capillary fill time is present but diminished feet are warm to the touch. Fourth toe right foot demonstrates a mild paronychia lateral border there is no cellulitis. I might add that the nails do not appear to have been cut very short at his last visit  Assessment: Paronychia fourth digit right foot.  Plan: At this point we performed a I and D fourth toe lateral border. This was performed after local anesthesia was achieved. The nail was split from distal to proximal and the margin was avulsed in total. All necrotic tissue is sharply resected. The digit was wrapped with Coban and a dry sterile compressive dressing he was encouraged to remove this dressing this afternoon or tomorrow morning at which time he will start soaking in Betadine and warm water. He was given both oral and written home-going instructions as well as a prescription for Bactroban ointment and Augmentin antibiotic. I will follow up with him in 1 week. He will call sooner if needed.

## 2015-01-10 ENCOUNTER — Other Ambulatory Visit: Payer: Self-pay | Admitting: Family Medicine

## 2015-01-10 ENCOUNTER — Encounter: Payer: BC Managed Care – PPO | Admitting: Podiatry

## 2015-01-10 ENCOUNTER — Encounter: Payer: Self-pay | Admitting: Podiatry

## 2015-01-10 NOTE — Patient Instructions (Signed)

## 2015-01-11 ENCOUNTER — Other Ambulatory Visit: Payer: Self-pay | Admitting: Family Medicine

## 2015-01-12 ENCOUNTER — Other Ambulatory Visit: Payer: Self-pay | Admitting: Family Medicine

## 2015-01-12 ENCOUNTER — Encounter: Payer: Self-pay | Admitting: Podiatry

## 2015-01-12 ENCOUNTER — Ambulatory Visit (INDEPENDENT_AMBULATORY_CARE_PROVIDER_SITE_OTHER): Payer: BC Managed Care – PPO | Admitting: Podiatry

## 2015-01-12 DIAGNOSIS — L600 Ingrowing nail: Secondary | ICD-10-CM

## 2015-01-12 NOTE — Progress Notes (Signed)
He presents today for follow-up and I and D fourth toe right foot. He states that it feels almost 100% improved. Continues to soak in Betadine warm water. He denies fever chills nausea vomiting muscle aches and pains.  Objective: Vital signs are stable he is alert and oriented 3. Fourth digit right foot demonstrates no erythema edema cellulitis drainage or odor. Appears to be healing well.  Assessment: Well-healing fourth digit right foot.  Plan: Discontinue the use of Betadine start with Epsom salts and warm water soaks until completely healed. He'll to do this at least once a day and cover during the day and leave open at night.  Roselind Messier DPM

## 2015-01-12 NOTE — Telephone Encounter (Signed)
Pt called to check the status of his refill request on Paxil. He needs this in a 90 day supply for the insurance to pay at the lower price. jw

## 2015-01-12 NOTE — Patient Instructions (Signed)

## 2015-01-17 NOTE — Progress Notes (Signed)
This encounter was created in error - please disregard.

## 2015-01-20 ENCOUNTER — Other Ambulatory Visit: Payer: Self-pay | Admitting: Family Medicine

## 2015-01-25 ENCOUNTER — Other Ambulatory Visit: Payer: Self-pay | Admitting: Family Medicine

## 2015-02-15 ENCOUNTER — Ambulatory Visit (INDEPENDENT_AMBULATORY_CARE_PROVIDER_SITE_OTHER): Payer: BC Managed Care – PPO | Admitting: Family Medicine

## 2015-02-15 ENCOUNTER — Encounter: Payer: Self-pay | Admitting: Family Medicine

## 2015-02-15 VITALS — BP 138/64 | HR 78 | Ht 73.0 in | Wt 231.0 lb

## 2015-02-15 DIAGNOSIS — Z0000 Encounter for general adult medical examination without abnormal findings: Secondary | ICD-10-CM | POA: Diagnosis not present

## 2015-02-15 DIAGNOSIS — Z9989 Dependence on other enabling machines and devices: Secondary | ICD-10-CM

## 2015-02-15 DIAGNOSIS — E1165 Type 2 diabetes mellitus with hyperglycemia: Secondary | ICD-10-CM | POA: Diagnosis not present

## 2015-02-15 DIAGNOSIS — Z23 Encounter for immunization: Secondary | ICD-10-CM | POA: Diagnosis not present

## 2015-02-15 DIAGNOSIS — IMO0002 Reserved for concepts with insufficient information to code with codable children: Secondary | ICD-10-CM

## 2015-02-15 DIAGNOSIS — G4733 Obstructive sleep apnea (adult) (pediatric): Secondary | ICD-10-CM

## 2015-02-15 DIAGNOSIS — Z1211 Encounter for screening for malignant neoplasm of colon: Secondary | ICD-10-CM

## 2015-02-15 NOTE — Patient Instructions (Signed)
Thank you so much for coming to visit today! I have placed a referral to GI so you can try to get your colonoscopy. Please try nasal saline for your dry nasal passages.  Please return in December 2016 so we can check some blood work. Please let me know if there is anything else I can do for you!  Thanks again! Dr. Gerlean Ren

## 2015-02-15 NOTE — Progress Notes (Signed)
Subjective:     Patient ID: Anthony [name], male   DOB: 02-25-1951, 65 y.o.   MRN: [id number]  HPI Anthony [name] is a 64yo male presenting today to meet new PCP. - Denies any acute concerns at this time - Does not need refills at this time - Chronic conditions include HTN, CAD, Venous Insufficiency, Atrial Fibrillation with RVR, Diastolic HF, OSA on CPAP, GERD, Diabetes with Retinopathy and Neuropathy, HLD, Depression, Chronic Pain - Reports diabetes is normally well controlled, however his glucometer broke the other day. Is in process of looking for another one. - Reports sore in nasal passage for the last two days. Denies bleeding. Reports use of CPAP machine. Feels dry. - Last colonoscopy noted in 2000. Reports he was scheduled for one last year, but it had to be canceled when he did not have anyone to go with him. Would like to try again this year but still does not have anyone to go with him. States he can ask one of his children to take a half day from work if he needs to. - Last BMP 10/2014 normal. Last A1C 10/2014 7.3, on q32months schedule. Last lipid panel 01/2014. Last CBC 10/2013. - Last Echo 01/2014 with moderate LVH, EF 55-60%  Review of Systems Per HPI    Objective:   Physical Exam  Constitutional: He appears well-developed and well-nourished. No distress.  HENT:  Head: Normocephalic and atraumatic.  Eyes: Pupils are equal, round, and reactive to light. Right eye exhibits no discharge. Left eye exhibits no discharge.  Neck: Normal range of motion. Neck supple.  Cardiovascular: Normal rate and regular rhythm.  Exam reveals no gallop and no friction rub.   No murmur heard. Pulmonary/Chest: Effort normal. No respiratory distress. He has no wheezes. He has no rales.  Abdominal: Soft. He exhibits no distension. There is no tenderness.  Musculoskeletal: He exhibits no edema.  Lymphadenopathy:    He has no cervical adenopathy.  Neurological: He is alert.  Skin:  Epidermoid cysts noted  on left chest and back  Psychiatric: He has a normal mood and affect. His behavior is normal.       Assessment and Plan:     DM (diabetes mellitus), type 2, uncontrolled - Well controlled - Follow up in 3 months for A1C check. On q15month schedule - Does not have glucometer, however in process of buying a new one  Health care maintenance - Referral to GI for colonoscopy. Agrees to ask children to take half day off of work to go with him if necessary  OSA on CPAP - Suspect soreness and dryness of nasal passage is secondary to CPAP. Recommend nasal saline.

## 2015-02-16 LAB — HM DIABETES EYE EXAM

## 2015-02-19 NOTE — Assessment & Plan Note (Signed)
-   Well controlled - Follow up in 3 months for A1C check. On q98month schedule - Does not have glucometer, however in process of buying a new one

## 2015-02-19 NOTE — Assessment & Plan Note (Signed)
-   Referral to GI for colonoscopy. Agrees to ask children to take half day off of work to go with him if necessary

## 2015-02-19 NOTE — Assessment & Plan Note (Signed)
-   Suspect soreness and dryness of nasal passage is secondary to CPAP. Recommend nasal saline.

## 2015-02-22 ENCOUNTER — Other Ambulatory Visit: Payer: Self-pay | Admitting: Family Medicine

## 2015-02-23 ENCOUNTER — Other Ambulatory Visit: Payer: Self-pay | Admitting: Pharmacist Clinician (PhC)/ Clinical Pharmacy Specialist

## 2015-02-23 MED ORDER — APIXABAN 5 MG PO TABS: ORAL_TABLET | ORAL | Status: DC

## 2015-03-21 ENCOUNTER — Encounter: Payer: Self-pay | Admitting: Podiatry

## 2015-03-21 ENCOUNTER — Ambulatory Visit (INDEPENDENT_AMBULATORY_CARE_PROVIDER_SITE_OTHER): Payer: BC Managed Care – PPO | Admitting: Podiatry

## 2015-03-21 DIAGNOSIS — E1142 Type 2 diabetes mellitus with diabetic polyneuropathy: Secondary | ICD-10-CM

## 2015-03-21 DIAGNOSIS — M79673 Pain in unspecified foot: Secondary | ICD-10-CM

## 2015-03-21 DIAGNOSIS — B351 Tinea unguium: Secondary | ICD-10-CM

## 2015-03-21 DIAGNOSIS — M79609 Pain in unspecified limb: Secondary | ICD-10-CM

## 2015-03-21 NOTE — Progress Notes (Signed)
Patient ID: Anthony [name], male   DOB: 1951-04-03, 64 y.o.   MRN: [id number] Complaint:  Visit Type: Patient returns to my office for continued preventative foot care services. Complaint: Patient states" my nails have grown long and thick and become painful to walk and wear shoes" Patient has been diagnosed with DM with neuropathy.. The patient presents for preventative foot care services. No changes to ROS  Podiatric Exam: Vascular: dorsalis pedis and posterior tibial pulses are palpable bilateral. Capillary return is immediate. Temperature gradient is WNL. Skin turgor WNL  Sensorium: Normal Semmes Weinstein monofilament test. Normal tactile sensation bilaterally. Nail Exam: Pt has thick disfigured discolored nails with subungual debris noted bilateral entire nail hallux through fifth toenails Ulcer Exam: There is no evidence of ulcer or pre-ulcerative changes or infection. Orthopedic Exam: Muscle tone and strength are WNL. No limitations in general ROM. No crepitus or effusions noted. Foot type and digits show no abnormalities. Bony prominences are unremarkable. Skin: No Porokeratosis. No infection or ulcers  Diagnosis:  Onychomycosis, , Pain in right toe, pain in left toes  Treatment & Plan Procedures and Treatment: Consent by patient was obtained for treatment procedures. The patient understood the discussion of treatment and procedures well. All questions were answered thoroughly reviewed. Debridement of mycotic and hypertrophic toenails, 1 through 5 bilateral and clearing of subungual debris. No ulceration, no infection noted.  Return Visit-Office Procedure: Patient instructed to return to the office for a follow up visit 3 months for continued evaluation and treatment.

## 2015-04-01 ENCOUNTER — Other Ambulatory Visit: Payer: Self-pay | Admitting: Family Medicine

## 2015-04-03 ENCOUNTER — Other Ambulatory Visit: Payer: Self-pay | Admitting: Family Medicine

## 2015-04-04 NOTE — Telephone Encounter (Signed)
Needs refill on cardevilol  Anthony [name] on [employer]

## 2015-04-26 ENCOUNTER — Encounter: Payer: Self-pay | Admitting: Family Medicine

## 2015-04-26 ENCOUNTER — Ambulatory Visit (INDEPENDENT_AMBULATORY_CARE_PROVIDER_SITE_OTHER): Payer: BC Managed Care – PPO | Admitting: Family Medicine

## 2015-04-26 VITALS — BP 106/79 | HR 85 | Temp 98.1°F | Wt 233.0 lb

## 2015-04-26 DIAGNOSIS — L723 Sebaceous cyst: Secondary | ICD-10-CM

## 2015-04-26 NOTE — Patient Instructions (Addendum)
Thank you so much for coming to visit today! The knot on your chest was most likely due to a cyst. We were able to get a lot of the contents out today, however the lining is still present. This means that you may continue to get drainage until the capsule is removed. Please let us know if the area becomes red or infected. If you wish, you may follow up with the Dermatology Clinic here for removal.  Thanks again! Dr. Gerlean Ren

## 2015-04-27 DIAGNOSIS — L723 Sebaceous cyst: Secondary | ICD-10-CM | POA: Insufficient documentation

## 2015-04-27 NOTE — Assessment & Plan Note (Addendum)
-   Contents of cyst extracted through tracts with external pressure. Patient noted immediate relief following.  - No medications at this time. Does not appear infected. - Counseled that cyst may again enlarge - Contact clinic if redness to area or fevers occur.  - Follow up with dermatology clinic for removal if needed

## 2015-04-27 NOTE — Progress Notes (Signed)
Subjective:     Patient ID: Anthony [name], male   DOB: November 20, 1950, 64 y.o.   MRN: [id number]  HPI Mr. Starwalt is a 64yo male presenting for knot on left chest.  # Sebaceous Cyst Left Chest - Reports history of similar complaint more than ten years ago, at which time a cyst was removed - First noted one month ago; initially seemed like it was going away, but has not been getting larger - Notes tenderness and increased sensitivity of left chest - Noted white drainage Sunday and Monday (11/27-11/28) with foul odor - Has not tried any medications - Denies fevers  Review of Systems Per HPI    Objective:   Physical Exam  Constitutional: He appears well-developed and well-nourished. No distress.  Skin:  Sebaceous cyst noted on left chest with two drainage tracts to skin, does not appear erythematous or irritated, no tenderness to palpation  Psychiatric: He has a normal mood and affect. His behavior is normal.       Assessment and Plan:     Sebaceous cyst - Contents of cyst extracted through tracts with external pressure. Patient noted immediate relief following.  - No medications at this time. Does not appear infected. - Counseled that cyst may again enlarge - Contact clinic if redness to area or fevers occur.  - Follow up with dermatology clinic for removal if needed

## 2015-05-11 ENCOUNTER — Ambulatory Visit: Payer: BC Managed Care – PPO | Admitting: Cardiology

## 2015-05-16 ENCOUNTER — Ambulatory Visit: Payer: BC Managed Care – PPO | Admitting: Gastroenterology

## 2015-05-16 ENCOUNTER — Ambulatory Visit: Payer: BC Managed Care – PPO | Admitting: Cardiology

## 2015-05-30 MED FILL — ELIQUIS 5 MG TABLET: 5 | 30 days supply | Qty: 60 | Fill #3

## 2015-06-06 ENCOUNTER — Ambulatory Visit: Payer: BC Managed Care – PPO | Admitting: Podiatry

## 2015-06-28 ENCOUNTER — Encounter: Payer: Self-pay | Admitting: Cardiology

## 2015-06-28 ENCOUNTER — Ambulatory Visit (INDEPENDENT_AMBULATORY_CARE_PROVIDER_SITE_OTHER): Payer: BC Managed Care – PPO | Admitting: Cardiology

## 2015-06-28 VITALS — BP 136/80 | HR 72 | Ht 73.0 in | Wt 234.4 lb

## 2015-06-28 DIAGNOSIS — I10 Essential (primary) hypertension: Secondary | ICD-10-CM | POA: Diagnosis not present

## 2015-06-28 DIAGNOSIS — I4891 Unspecified atrial fibrillation: Secondary | ICD-10-CM

## 2015-06-28 DIAGNOSIS — I5033 Acute on chronic diastolic (congestive) heart failure: Secondary | ICD-10-CM

## 2015-06-28 DIAGNOSIS — I2510 Atherosclerotic heart disease of native coronary artery without angina pectoris: Secondary | ICD-10-CM | POA: Diagnosis not present

## 2015-06-28 DIAGNOSIS — R600 Localized edema: Secondary | ICD-10-CM

## 2015-06-28 LAB — CBC WITH DIFFERENTIAL/PLATELET
Basophils Absolute: 0 10*3/uL (ref 0.0–0.1)
Basophils Relative: 0 % (ref 0–1)
Eosinophils Absolute: 0.2 10*3/uL (ref 0.0–0.7)
Eosinophils Relative: 3 % (ref 0–5)
HCT: 42.5 % (ref 39.0–52.0)
Hemoglobin: 14.1 g/dL (ref 13.0–17.0)
Lymphocytes Relative: 29 % (ref 12–46)
Lymphs Abs: 2.1 10*3/uL (ref 0.7–4.0)
MCH: 28.6 pg (ref 26.0–34.0)
MCHC: 33.2 g/dL (ref 30.0–36.0)
MCV: 86.2 fL (ref 78.0–100.0)
MPV: 9.9 fL (ref 8.6–12.4)
Monocytes Absolute: 0.7 10*3/uL (ref 0.1–1.0)
Monocytes Relative: 10 % (ref 3–12)
Neutro Abs: 4.2 10*3/uL (ref 1.7–7.7)
Neutrophils Relative %: 58 % (ref 43–77)
Platelets: 148 10*3/uL — ABNORMAL LOW (ref 150–400)
RBC: 4.93 MIL/uL (ref 4.22–5.81)
RDW: 15.4 % (ref 11.5–15.5)
WBC: 7.2 10*3/uL (ref 4.0–10.5)

## 2015-06-28 LAB — COMPREHENSIVE METABOLIC PANEL
ALT: 15 U/L (ref 9–46)
AST: 18 U/L (ref 10–35)
Albumin: 3.9 g/dL (ref 3.6–5.1)
Alkaline Phosphatase: 102 U/L (ref 40–115)
BUN: 14 mg/dL (ref 7–25)
CO2: 25 mmol/L (ref 20–31)
Calcium: 9 mg/dL (ref 8.6–10.3)
Chloride: 105 mmol/L (ref 98–110)
Creat: 1.45 mg/dL — ABNORMAL HIGH (ref 0.70–1.25)
Glucose, Bld: 240 mg/dL — ABNORMAL HIGH (ref 65–99)
Potassium: 4.1 mmol/L (ref 3.5–5.3)
Sodium: 140 mmol/L (ref 135–146)
Total Bilirubin: 0.9 mg/dL (ref 0.2–1.2)
Total Protein: 6.5 g/dL (ref 6.1–8.1)

## 2015-06-28 LAB — TSH: TSH: 0.919 u[IU]/mL (ref 0.350–4.500)

## 2015-06-28 MED ORDER — AMLODIPINE BESYLATE 5 MG PO TABS: 5.0000 mg | ORAL_TABLET | Freq: Every day | ORAL | Status: DC

## 2015-06-28 NOTE — Patient Instructions (Signed)
Medication Instructions:   DECREASE YOUR AMLODIPINE 5 MG ONCE DAILY   Labwork:  TODAY--CMET, TSH, AND CBC W DIFF     Follow-Up:  2 MONTHS WITH DR Meda Coffee       If you need a refill on your cardiac medications before your next appointment, please call your pharmacy.

## 2015-06-28 NOTE — Progress Notes (Signed)
Patient ID: Anthony [name], male   DOB: 08-Dec-1950, 65 y.o.   MRN: [id number]    06/28/2015  PCP: Junie Panning, DO  Chief complain: DOE  Primary Cardiologist: Dr. Verl Blalock now Dr. Meda Coffee  HPI:  65 year-old male with previous history of acute MI and in 2001 , congestive heart failure may be diastolic, coronary artery disease ( 2001 cardiac cath revealed nonobstructive coronary disease with an EF of 60%).  In 2003 patient again had chest pain underwent cardiac cath with successful stenting of the circumflex lesion extending into the second obtuse marginal branch with a drug-eluting stent.  Myoview 2007 with no ischemia and EF of 59%.   Pt has with him an EKG from 2001 that also has a fib.  All other EKGs in the computer are with SR.    Pt was at opthamologist on Thursday the 11th of June and he was found to be in A. fib with RVR.  The office was called and it our office instructed the patient to go to the emergency room. In the initial EKG heart rate was 141.  In the emergency room his rate was improved control 110 though A. fib continued. His troponin was negative the ER note states patient will followup in the office.  He underwent a stress test in July that was negative for prior MI or ischemia. He denies any further chest pain or SOB. No palpitations. No syncope. Some orthostatic hypotension when he stands up.  He is complaint with his meds.  His Chads2Vas2  score is 4. No bruising from Eliquis.  06/28/2015 -The patient reports chronic LE edema and mild SOB, he sleeps well, denies orthopnea or PND, denies palpitations or syncope. His HR is now in 70-80 without amiodarone that he stopped taking as he felt it was making him tired.  He denies palpitations, his rate in controlled.   Allergies  Allergen Reactions  . Enalapril Maleate Cough    REACTION: cough    Current Outpatient Prescriptions  Medication Sig Dispense Refill  . amLODipine (NORVASC) 10 MG tablet Take 10 mg by mouth daily.    Marland Kitchen  apixaban (ELIQUIS) 5 MG TABS tablet TAKE 1 TABLET (5 MG TOTAL) BY MOUTH 2 (TWO) TIMES DAILY. 60 tablet 6  . carvedilol (COREG) 25 MG tablet TAKE 1 TABLET BY MOUTH TWICE DAILY WITH A MEAL 180 tablet 6  . furosemide (LASIX) 40 MG tablet Take 1 tablet (40 mg total) by mouth 2 (two) times daily. TAKE 1 TAB IN THE AM AND 1 TAB AT 2 PM 180 tablet 3  . isosorbide mononitrate (IMDUR) 60 MG 24 hr tablet TAKE 1 TABLET (60 MG TOTAL) BY MOUTH DAILY. 90 tablet 3  . losartan (COZAAR) 100 MG tablet TAKE 1 TABLET (100 MG TOTAL) BY MOUTH DAILY. 90 tablet 2  . metFORMIN (GLUCOPHAGE) 1000 MG tablet TAKE 1 TABLET BY MOUTH TWICE DAILY WITH A MEAL 120 tablet 3  . Multiple Vitamin (MULTIVITAMIN) tablet Take 1 tablet by mouth daily.      . nitroGLYCERIN (NITROSTAT) 0.4 MG SL tablet Place 0.4 mg under the tongue every 5 (five) minutes as needed. If no relief by 3rd tab, call 911     . NOVOLIN 70/30 (70-30) 100 UNIT/ML injection INJECT 60 UNITS INTO THE SKIN 2 (TWO) TIMES DAILY. 110 mL 1  . Omega 3 1000 MG CAPS Take 3 capsules by mouth daily.    Marland Kitchen oxymorphone (OPANA) 5 MG tablet Take 5-10 mg by mouth every 8 (eight)  hours. Per Dr Brien Few    . PARoxetine (PAXIL) 40 MG tablet TAKE 1 TABLET BY MOUTH EVERY MORNING 90 tablet 3  . potassium chloride (K-DUR) 10 MEQ tablet Take 20 mEq by mouth every morning.    . potassium chloride (KLOR-CON) 20 MEQ packet Take 20 mEq by mouth daily. In the late afternoon.    . ranitidine (ZANTAC) 150 MG tablet Take 150 mg by mouth daily.    . simvastatin (ZOCOR) 20 MG tablet TAKE 1 TABLET (20 MG TOTAL) BY MOUTH AT BEDTIME. 90 tablet 3   No current facility-administered medications for this visit.   Past Medical History  Diagnosis Date  . Diabetes mellitus   . Arthritis   . CHF (congestive heart failure) ([place])   . Hypertension   . Hyperlipidemia   . Depression   . Erectile dysfunction   . Onychomycosis   . GERD (gastroesophageal reflux disease)   . Edema of extremities   . Myocardial  infarction ([place]) 2000&2001  . Obstructive apnea   . Unspecified sleep apnea 02/22/2013  . Tubular adenoma of colon 10/2002   Past Surgical History  Procedure Laterality Date  . Angioplasty  2001    stent x 1  . Toe amputation Left 2012  . Colonoscopy  2000?    negative   [id number] colds or fevers, no weight changes Skin:no rashes or ulcers HEENT:no blurred vision, no congestion CV:see HPI PUL:see HPI GI:no diarrhea constipation or melena, no indigestion GU:no hematuria, no dysuria MS:no joint pain, no claudication Neuro:no syncope,+ lightheadedness Endo:+ diabetes has been stable., no thyroid disease  Wt Readings from Last 3 Encounters:  06/28/15 234 lb 6.4 oz (106.323 kg)  04/26/15 233 lb (105.688 kg)  02/15/15 231 lb (104.781 kg)   PHYSICAL EXAM BP 136/80 mmHg  Pulse 72  Ht 6\' 1"  (1.854 m)  Wt 234 lb 6.4 oz (106.323 kg)  BMI 30.93 kg/m2 General:Pleasant affect, NAD Skin:Warm and dry, brisk capillary refill HEENT:normocephalic, sclera clear, mucus membranes moist Neck:supple, no JVD, no bruits , no adenopathy Heart:S1S2 irreg irreg rapid without murmur, gallup, rub or click Lungs:clear without rales, rhonchi, or wheezes [id number], non tender, + BS, do not palpate liver spleen or masses Ext: lower ext edema B/L 1+, 2+ radial pulses Neuro:alert and oriented, MAE, follows commands, + facial symmetry  EKG:A fib with HR 72 BPM  Septal infarct, inferior infarct  Lexiscan nuclear stress test: 12/01/2013  Impression Exercise Capacity: Lexiscan with no exercise. BP Response: Normal blood pressure response. Clinical Symptoms: There is dyspnea. ECG Impression: No significant ECG changes with Lexiscan. Comparison with Prior Nuclear Study: No images to compare  Overall Impression: Low risk stress nuclear study with what  appears to be a small area of fixed perfusion defect that may be  consistent with a prior apical-lateral infarction.. No evidence  of ischemia.  TTE:  02/23/2014 - Left ventricle: The cavity size was normal. Wall thickness was increased in a pattern of moderate LVH. Systolic function was normal. The estimated ejection fraction was in the range of 55% to 60%. Wall motion was normal; there were no regional wall motion abnormalities. - Left atrium: The atrium was severely dilated. - Right atrium: The atrium was moderately dilated.     ASSESSMENT AND PLAN    1. Atrial fibrillation with RVR  Found to be in A. fib with RVR when he went to have his cataracts done in June 2015. However the patient states that he has been in a-fib at his hospitalization in  2001. Chads2Vasc2 score is 4.  He d/c ed his digoxin and amiodarone as he had significant side effects. Now rate controlled. On Eliquis. No bleeding.  2. Acute on chronic CHF - chronic LE edema- Increased lasix to 40 mg po BID and KCl to 20 mEq po QAM, and 10 mEq QPM,  check BMP today. If crea stable increase lasix to 80 mg in the am and 40 at 2 pm. Decrease amlodipine to 5 mg po daily.  3. CORONARY, ARTERIOSCLEROSIS  2003 with stent to the circumflex stenosis extending into the second obtuse marginal branch with DES. Last nuclear stress test July 2015 negative for ischemia   4.  DM (diabetes mellitus), type 2, uncontrolled  Uncontrolled at times, insulin dependent   Follow up in 2 months.  Dorothy Spark 06/28/2015

## 2015-06-30 MED FILL — ELIQUIS 5 MG TABLET: 5 | 30 days supply | Qty: 60 | Fill #4

## 2015-07-17 ENCOUNTER — Other Ambulatory Visit: Payer: Self-pay | Admitting: *Deleted

## 2015-07-17 MED ORDER — POTASSIUM CHLORIDE 20 MEQ PO PACK: 20.0000 meq | PACK | Freq: Every day | ORAL | Status: DC

## 2015-07-27 MED FILL — ELIQUIS 5 MG TABLET: 5 | 30 days supply | Qty: 60 | Fill #5

## 2015-07-28 ENCOUNTER — Other Ambulatory Visit: Payer: Self-pay | Admitting: *Deleted

## 2015-07-28 MED ORDER — POTASSIUM CHLORIDE 20 MEQ PO PACK: 20.0000 meq | PACK | Freq: Every day | ORAL | Status: DC

## 2015-07-31 ENCOUNTER — Telehealth: Payer: Self-pay | Admitting: Family Medicine

## 2015-07-31 MED ORDER — POTASSIUM CHLORIDE CRYS ER 20 MEQ PO TBCR: 20.0000 meq | EXTENDED_RELEASE_TABLET | Freq: Every day | ORAL | Status: DC

## 2015-07-31 NOTE — Telephone Encounter (Signed)
Is taking 2 10 mg tablets of kola-crom ? Per day. Could this be made to 1 20mg  tablet per day?

## 2015-07-31 NOTE — Telephone Encounter (Signed)
Done

## 2015-08-10 DIAGNOSIS — E113313 Type 2 diabetes mellitus with moderate nonproliferative diabetic retinopathy with macular edema, bilateral: Secondary | ICD-10-CM | POA: Diagnosis not present

## 2015-08-28 ENCOUNTER — Encounter: Payer: Self-pay | Admitting: Family Medicine

## 2015-08-28 ENCOUNTER — Ambulatory Visit: Payer: BC Managed Care – PPO | Admitting: Family Medicine

## 2015-08-28 ENCOUNTER — Ambulatory Visit (INDEPENDENT_AMBULATORY_CARE_PROVIDER_SITE_OTHER): Payer: PPO | Admitting: Family Medicine

## 2015-08-28 VITALS — BP 132/86 | HR 151 | Temp 98.2°F | Ht 73.0 in | Wt 236.5 lb

## 2015-08-28 DIAGNOSIS — M542 Cervicalgia: Secondary | ICD-10-CM | POA: Diagnosis not present

## 2015-08-28 DIAGNOSIS — E118 Type 2 diabetes mellitus with unspecified complications: Secondary | ICD-10-CM

## 2015-08-28 DIAGNOSIS — E1165 Type 2 diabetes mellitus with hyperglycemia: Secondary | ICD-10-CM

## 2015-08-28 LAB — POCT GLYCOSYLATED HEMOGLOBIN (HGB A1C): Hemoglobin A1C: 6.8

## 2015-08-28 MED FILL — ELIQUIS 5 MG TABLET: 5 | 30 days supply | Qty: 60 | Fill #6

## 2015-08-28 NOTE — Patient Instructions (Addendum)
Thank you so much for coming to visit today!  Your A1C was 6.8 today! Keep up the good work! Follow up in 6 months and we will recheck your A1C.  I believe your neck pain is due to a pulled muscle. You may try over the counter medications, such as the Salonpas patch. If it fails to improve over the next several weeks please let me know!  Thanks again! Dr. Gerlean Ren

## 2015-08-29 MED ORDER — INSULIN NPH ISOPHANE & REGULAR (70-30) 100 UNIT/ML ~~LOC~~ SUSP: SUBCUTANEOUS | Status: DC

## 2015-08-29 MED ORDER — POTASSIUM CHLORIDE CRYS ER 20 MEQ PO TBCR: 20.0000 meq | EXTENDED_RELEASE_TABLET | Freq: Every day | ORAL | Status: DC

## 2015-08-29 NOTE — Progress Notes (Signed)
Subjective:     Patient ID: [name], male   DOB: November 12, 1950, 65 y.o.   MRN: [id number]  HPI Mr. Salonen is a 65yo male presenting with neck pain. - Reports right sided neck pain - Pain is worse when turning head to right - Describes pain as "nagging" - Notes stiffness when turning head, noted while driving - Notes "gushy" sound in neck occasionally when turning head - Worse at the end of the day - Has not been using any medications for pain  - Also noted to be due to for A1C check. Last A1C 7.3 in 10/2014 - Admits that he has not been checking his blood sugars regularly - Currently prescribed Metformin 1000mg  BID, Novolin 70/30 20 units at night and 60units during the day (was on 60units BID, but he self-decreased dose since he was having low blood sugars at night)  Review of Systems Per HPI. Other systems negative.    Objective:   Physical Exam  Constitutional: He appears well-developed and well-nourished. No distress.  HENT:  Head: Normocephalic and atraumatic.  Mouth/Throat: No oropharyngeal exudate.  Cardiovascular: Normal rate.   No murmur heard. Irregular rhythm (h/o atrial fibrillation)  Pulmonary/Chest: Effort normal. No respiratory distress. He has no wheezes.  Musculoskeletal:  Tenderness along right sternocleidomastoid muscle, worse with right sidebending/rotation      Assessment and Plan:     1. Uncontrolled type 2 diabetes mellitus with complication, unspecified long term insulin use status (HCC) - A1C improved to 6.8 - Continue Metformin, Novolin 70/30 - Continue to work on diet and exercise - Follow up in 50months  2. Neck pain - OMM performed. Pain resolved with flexion and sidebending/rotation left. Position held for 90 seconds with significant improvement in pain. - Recommend over the counter treatment. Salonpas patches recommended. - Follow up if no improvement over the next several weeks.

## 2015-09-05 ENCOUNTER — Other Ambulatory Visit: Payer: Self-pay | Admitting: *Deleted

## 2015-09-05 DIAGNOSIS — R600 Localized edema: Secondary | ICD-10-CM

## 2015-09-05 MED ORDER — AMLODIPINE BESYLATE 5 MG PO TABS: 5.0000 mg | ORAL_TABLET | Freq: Every day | ORAL | Status: DC

## 2015-09-12 ENCOUNTER — Encounter: Payer: Self-pay | Admitting: Cardiology

## 2015-09-12 ENCOUNTER — Ambulatory Visit (INDEPENDENT_AMBULATORY_CARE_PROVIDER_SITE_OTHER): Payer: PPO | Admitting: Cardiology

## 2015-09-12 VITALS — BP 142/68 | HR 79 | Ht 73.0 in | Wt 236.0 lb

## 2015-09-12 DIAGNOSIS — I5033 Acute on chronic diastolic (congestive) heart failure: Secondary | ICD-10-CM | POA: Diagnosis not present

## 2015-09-12 DIAGNOSIS — I4891 Unspecified atrial fibrillation: Secondary | ICD-10-CM

## 2015-09-12 DIAGNOSIS — I10 Essential (primary) hypertension: Secondary | ICD-10-CM | POA: Diagnosis not present

## 2015-09-12 DIAGNOSIS — E7800 Pure hypercholesterolemia, unspecified: Secondary | ICD-10-CM

## 2015-09-12 NOTE — Patient Instructions (Signed)
Medication Instructions:   Your physician recommends that you continue on your current medications as directed. Please refer to the Current Medication list given to you today.    Labwork:  PRIOR TO YOUR 6 MONTH FOLLOW-UP APPOINTMENT WITH DR NELSON TO CHECK ---CMET, CBC W DIFF, AND LIPIDS--PLEASE COME FASTING TO THIS LAB APPOINTMENT    Follow-Up:  Your physician wants you to follow-up in: [place] will receive a reminder letter in the mail two months in advance. If you don't receive a letter, please call our office to schedule the follow-up appointment.  PLEASE HAVE YOUR LABS DONE PRIOR TO THIS APPOINTMENT     If you need a refill on your cardiac medications before your next appointment, please call your pharmacy.

## 2015-09-12 NOTE — Progress Notes (Signed)
Patient ID: Anthony [name], male   DOB: Mar 13, 1951, 65 y.o.   MRN: [id number]    09/12/2015  PCP: Junie Panning, DO  Chief complain: DOE  Primary Cardiologist: Dr. Verl Blalock now Dr. Meda Coffee  HPI:  65 year-old male with previous history of acute MI and in 2001 , congestive heart failure may be diastolic, coronary artery disease ( 2001 cardiac cath revealed nonobstructive coronary disease with an EF of 60%).  In 2003 patient again had chest pain underwent cardiac cath with successful stenting of the circumflex lesion extending into the second obtuse marginal branch with a drug-eluting stent.  Myoview 2007 with no ischemia and EF of 59%.   Pt has with him an EKG from 2001 that also has a fib.  All other EKGs in the computer are with SR.    Pt was at opthamologist on Thursday the 11th of June and he was found to be in A. fib with RVR.  The office was called and it our office instructed the patient to go to the emergency room. In the initial EKG heart rate was 141.  In the emergency room his rate was improved control 110 though A. fib continued. His troponin was negative the ER note states patient will followup in the office.  He underwent a stress test in July that was negative for prior MI or ischemia. He denies any further chest pain or SOB. No palpitations. No syncope. Some orthostatic hypotension when he stands up.  He is complaint with his meds.  His Chads2Vas2  score is 4. No bruising from Eliquis.  09/12/15 - 2 months follow u for acute on chronic diastolic CHF, his lasix was increased at the last visit. He feels better, denies CP, DOE, orthopnea, PND. No palpitations.  Allergies  Allergen Reactions  . Enalapril Maleate Cough    REACTION: cough    Current Outpatient Prescriptions  Medication Sig Dispense Refill  . amLODipine (NORVASC) 5 MG tablet Take 1 tablet (5 mg total) by mouth daily. 180 tablet 3  . apixaban (ELIQUIS) 5 MG TABS tablet TAKE 1 TABLET (5 MG TOTAL) BY MOUTH 2 (TWO) TIMES  DAILY. 60 tablet 6  . carvedilol (COREG) 25 MG tablet TAKE 1 TABLET BY MOUTH TWICE DAILY WITH A MEAL 180 tablet 6  . furosemide (LASIX) 40 MG tablet Take 1 tablet (40 mg total) by mouth 2 (two) times daily. TAKE 1 TAB IN THE AM AND 1 TAB AT 2 PM 180 tablet 3  . insulin NPH-regular Human (NOVOLIN 70/30) (70-30) 100 UNIT/ML injection Inject 60units in morning, 20 units at night 110 mL 3  . isosorbide mononitrate (IMDUR) 60 MG 24 hr tablet TAKE 1 TABLET (60 MG TOTAL) BY MOUTH DAILY. 90 tablet 3  . losartan (COZAAR) 100 MG tablet TAKE 1 TABLET (100 MG TOTAL) BY MOUTH DAILY. 90 tablet 2  . metFORMIN (GLUCOPHAGE) 1000 MG tablet TAKE 1 TABLET BY MOUTH TWICE DAILY WITH A MEAL 120 tablet 3  . Multiple Vitamin (MULTIVITAMIN) tablet Take 1 tablet by mouth daily.      . nitroGLYCERIN (NITROSTAT) 0.4 MG SL tablet Place 0.4 mg under the tongue every 5 (five) minutes as needed. If no relief by 3rd tab, call 911     . Omega 3 1000 MG CAPS Take 3 capsules by mouth daily.    Marland Kitchen oxymorphone (OPANA) 5 MG tablet Take 5-10 mg by mouth every 8 (eight) hours. Per Dr Brien Few    . PARoxetine (PAXIL) 40 MG tablet TAKE 1  TABLET BY MOUTH EVERY MORNING 90 tablet 3  . potassium chloride SA (K-DUR,KLOR-CON) 20 MEQ tablet Take 1 tablet (20 mEq total) by mouth daily. 90 tablet 3  . ranitidine (ZANTAC) 150 MG tablet Take 150 mg by mouth daily.    . simvastatin (ZOCOR) 20 MG tablet TAKE 1 TABLET (20 MG TOTAL) BY MOUTH AT BEDTIME. 90 tablet 3   No current facility-administered medications for this visit.   Past Medical History  Diagnosis Date  . Diabetes mellitus   . Arthritis   . CHF (congestive heart failure) ([place])   . Hypertension   . Hyperlipidemia   . Depression   . Erectile dysfunction   . Onychomycosis   . GERD (gastroesophageal reflux disease)   . Edema of extremities   . Myocardial infarction ([place]) 2000&2001  . Obstructive apnea   . Unspecified sleep apnea 02/22/2013  . Tubular adenoma of colon 10/2002   Past  Surgical History  Procedure Laterality Date  . Angioplasty  2001    stent x 1  . Toe amputation Left 2012  . Colonoscopy  2000?    negative   [id number] colds or fevers, no weight changes Skin:no rashes or ulcers HEENT:no blurred vision, no congestion CV:see HPI PUL:see HPI GI:no diarrhea constipation or melena, no indigestion GU:no hematuria, no dysuria MS:no joint pain, no claudication Neuro:no syncope,+ lightheadedness Endo:+ diabetes has been stable., no thyroid disease  Wt Readings from Last 3 Encounters:  09/12/15 236 lb (107.049 kg)  08/28/15 236 lb 8 oz (107.276 kg)  06/28/15 234 lb 6.4 oz (106.323 kg)   PHYSICAL EXAM BP 142/68 mmHg  Pulse 79  Ht 6\' 1"  (1.854 m)  Wt 236 lb (107.049 kg)  BMI 31.14 kg/m2  SpO2 98% General:Pleasant affect, NAD Skin:Warm and dry, brisk capillary refill HEENT:normocephalic, sclera clear, mucus membranes moist Neck:supple, no JVD, no bruits , no adenopathy Heart:S1S2 irreg irreg rapid without murmur, gallup, rub or click Lungs:clear without rales, rhonchi, or wheezes [id number], non tender, + BS, do not palpate liver spleen or masses Ext: lower ext edema B/L 1+, 2+ radial pulses Neuro:alert and oriented, MAE, follows commands, + facial symmetry  EKG:A fib with HR 72 BPM  Septal infarct, inferior infarct  Lexiscan nuclear stress test: 12/01/2013  Impression Exercise Capacity: Lexiscan with no exercise. BP Response: Normal blood pressure response. Clinical Symptoms: There is dyspnea. ECG Impression: No significant ECG changes with Lexiscan. Comparison with Prior Nuclear Study: No images to compare  Overall Impression: Low risk stress nuclear study with what  appears to be a small area of fixed perfusion defect that may be  consistent with a prior apical-lateral infarction.. No evidence  of ischemia.  TTE: 02/23/2014 - Left ventricle: The cavity size was normal. Wall thickness was increased in a pattern of moderate LVH.  Systolic function was normal. The estimated ejection fraction was in the range of 55% to 60%. Wall motion was normal; there were no regional wall motion abnormalities. - Left atrium: The atrium was severely dilated. - Right atrium: The atrium was moderately dilated.    ASSESSMENT AND PLAN  1. Atrial fibrillation with RVR  Found to be in A. fib with RVR when he went to have his cataracts done in June 2015. Chads2Vasc2 score is 4.  He d/c ed his digoxin and amiodarone as he had significant side effects. Now rate controlled. On Eliquis. No bleeding.  2. Acute on chronic CHF - chronic LE edema- Increased lasix to 40 mg po BID and KCl to  20 mEq po QAM, and 10 mEq QPM,  crea slightly elevated - 1.2 --> 1.4,   3. CORONARY, ARTERIOSCLEROSIS  2003 with stent to the circumflex stenosis extending into the second obtuse marginal branch with DES. Last nuclear stress test July 2015 negative for ischemia, asymptomatic, no workup needed at this time.  4.  DM (diabetes mellitus), type 2, uncontrolled  Uncontrolled at times, insulin dependent   5. Hypertension - controlled.  6. HLP - on simvastatin  Follow up in 6 months with lipids CMP, CBC prior to the appointment .  Dorothy Spark 09/12/2015

## 2015-09-15 ENCOUNTER — Other Ambulatory Visit: Payer: Self-pay

## 2015-09-15 MED ORDER — ISOSORBIDE MONONITRATE ER 60 MG PO TB24: ORAL_TABLET | ORAL | Status: DC

## 2015-09-27 ENCOUNTER — Other Ambulatory Visit: Payer: Self-pay | Admitting: Cardiology

## 2015-09-28 MED FILL — ELIQUIS 5 MG TABLET: 5 | 30 days supply | Qty: 60 | Fill #0

## 2015-10-18 ENCOUNTER — Ambulatory Visit: Payer: BC Managed Care – PPO | Admitting: Neurology

## 2015-10-21 ENCOUNTER — Other Ambulatory Visit: Payer: Self-pay | Admitting: Family Medicine

## 2015-10-26 MED FILL — ELIQUIS 5 MG TABLET: 5 | 30 days supply | Qty: 60 | Fill #1

## 2015-10-27 ENCOUNTER — Other Ambulatory Visit: Payer: Self-pay | Admitting: *Deleted

## 2015-10-30 MED ORDER — LOSARTAN POTASSIUM 100 MG PO TABS: ORAL_TABLET | ORAL | Status: DC

## 2015-10-30 MED ORDER — SIMVASTATIN 20 MG PO TABS: ORAL_TABLET | ORAL | Status: DC

## 2015-10-30 NOTE — Telephone Encounter (Signed)
Patient states he is completely out of meds, would like these called in today of possible.

## 2015-10-31 ENCOUNTER — Encounter: Payer: Self-pay | Admitting: Neurology

## 2015-10-31 ENCOUNTER — Ambulatory Visit (INDEPENDENT_AMBULATORY_CARE_PROVIDER_SITE_OTHER): Payer: PPO | Admitting: Neurology

## 2015-10-31 VITALS — BP 156/88 | HR 78 | Resp 20 | Ht 73.0 in | Wt 231.0 lb

## 2015-10-31 DIAGNOSIS — Z7189 Other specified counseling: Secondary | ICD-10-CM | POA: Diagnosis not present

## 2015-10-31 DIAGNOSIS — Z9989 Dependence on other enabling machines and devices: Principal | ICD-10-CM

## 2015-10-31 DIAGNOSIS — G4733 Obstructive sleep apnea (adult) (pediatric): Secondary | ICD-10-CM

## 2015-10-31 NOTE — Progress Notes (Addendum)
[facility]  Provider:  Larey Seat, M D  Referring Provider: Street, Sharon Mt, * Primary Care Physician:  Junie Panning, DO  Chief Complaint  [name] presents with  . Follow-up    cpap going "ok", side sleeper-mask falls off, uses [facility]    HPI:  Anthony [name] is a 65 y.o. right handed african [place] male [name] , originally seen in 2014  upon referral from Dr. Brien Few  for an evaluation and treatment of possible sleep apnea.  Anthony [name]  referral is dated 12-27 -2013 and was signed by Dr. Marlaine Hind,  His referring physician indicated that Anthony [name] has diabetes chronic lower back pain,  radiating to Anthony lower extremities, and that is treated with chronic opiate therapy. Anthony need for opioids in his pain therapy was a  concern to be addressed in a sleep consultation and possible polysomnogram, mainly to rule out that central apneas will be triggered by pain medication. Anthony [name] is 65 years of age and works at [employer] , Engineer, materials trucks, checking in and out of vehicles. Exam of 11:30 PM and falls asleep very quickly, arises at 5 AM.  This  means that he only gets about 5-1/2 hours of sleep on average. He usually doesn't need Anthony alarm he wakes up spontaneously. He has 2-3 times at night bathroom breaks interrupting his  sleep.Anthony [name] endorses muscle cramps and lower extremity aches, constipation,  Witnessed  snoring, some depression.He does not feel that he has to much or too little sleep. He did not endorse swelling in his legs, wheezing, hearing loss, incontinence to stool or urine.  No witnessed apneas.  He is a past medical history of hypertension, diabetes, and heart disease, he is an active smoker of 1/2 packs per day but he does not drink any alcohol, denies  any use of illegal drugs. He had nasal facial surgery, it appears like a cleft lip repair , but he states this is following an accident in his youth.  He is excessive  sleepy in daytime, drowsy when not physically active and stimulated.  Epworth 14 , FSS is 23, GDS 6 points.    Interval history :  04-04-14  Anthony [name] has meanwhile retired form his job at [employer].  Anthony [name] underwent a split night polysomnography on 03-03-13. At Anthony time he endorsed Anthony Epworth Sleepiness Scale at 14 points Anthony geriatric depression score at 6 points on Anthony fatigue severity score at 23 points. His AHI was 34.5 and RDI was 37.3. There was a strong REM accentuation of his apnea at an AHI of 90 during REM sleep. Supine  apnea was also 65.2/hr. He had only 9 minutes of desaturations at all below 90% his heart rates showed a sinus rhythm with intermittent PVCs. There were no significant periodic limb movements noted. Anthony [name] was titrated to 7 cm water pressure on CPAP,  his AHI became 0.0,  he is using a nasal pillow mask -called swift FX. Plan Continue at 7cm water CPAP , with current mask.  Improve compliance -  establish routines.  He is encouraged to put Anthony CPAP on , when he is at rest and ready to sleep.  This includes naps. RV in 12 month with machine and Epworth . May see NP/ Download, compliance visit. Anthony download data today confirms that Anthony [name] has used Anthony machine for 30 days out of 30 days. 18 of those days over 4 hours. His average use  of 4 hours 14 minutes Anthony machine is set at 7 cm water pressure with 2 cm EPR is residual AHI is 3.1 he has a moderate air leak. Overall his apnea is very well controlled but Anthony [name] can improve on compliance by either using his machine at least 30 more minutes each night and also making sure that he puts Anthony mask/ switches Anthony machine on right away when he goes to bed. He still has nocturia , he can sleep supine now, with CPAP. He sleeps on average 7 hours now, but why is his compliance time so meager?  We discussed strategies to  improve compliance.  We discussed Anthony results and explained Anthony risks associated with untreated  sleep apnea. He is aware of his irregular heart beat.    10-18-14 Anthony [name]  has been followed in our practice for Anthony last 2 years after he had a split-night polysomnography on 03-03-13 ordered by Dr. Brien Few his AHI was 34.9 there was a strong supine component, supine AHI was 65.2 and there was a strong REM component at 90. There were not many desaturations. There were no central apneas. It was decided that Anthony [name] would need CPAP and he has been using CPAP at 7 cm water. Today Anthony [name]'s compliance record dated 10-13-14 used to machine 27 out of 30 days with a 90% compliance and 77% compliance for over 4 hours of use his average user time is 4 hours and 21 minutes. His CPAP is still set at 7 cm water was 27 m EPR his AHI is 1.5. This is a good result and warrants no adjustments he does have a at times high air leak which may be when Anthony mask slips. It seems to be that his air mostly leaks out of Anthony right nostril. Nasal pillows and feels that these are comfortable.  6 of June 2017 Anthony pleasure of seeing Anthony [name] once a year here and his Epworth sleepiness score has increased to 10 points from 4 to take severity was not onset, geriatric depression score was endorsed at 0 out of 15 points. His compliance with CPAP is set at 7 cm water with 27 m EPR has been lower. 30%. Average daily use a time on days used is 4 hours 33 minutes overall user time 1 hour 40 minutes. Residual AHI was very high 14.6 I believe this is part due to high air leaks and partially due to him dislodging Anthony mask when he tries to sleep on his side. He would like to switch to an air-fit  P 10 mask that would be sitting under his nose and not covering mouth and nose.  Review of Systems:  Dysphonia.  He avoids daytime naps.  He still feels an urge to go to sleep.    Family History  Problem Relation Age of Onset  . Colon cancer Neg Hx   . Stomach cancer Neg Hx   . Heart attack Mother   . Heart attack Father     Past  Medical History  Diagnosis Date  . Diabetes mellitus   . Arthritis   . CHF (congestive heart failure) ([place])   . Hypertension   . Hyperlipidemia   . Depression   . Erectile dysfunction   . Onychomycosis   . GERD (gastroesophageal reflux disease)   . Edema of extremities   . Myocardial infarction ([place]) 2000&2001  . Obstructive apnea   . Unspecified sleep apnea 02/22/2013  . Tubular adenoma of colon 10/2002  Past Surgical History  Procedure Laterality Date  . Angioplasty  2001    stent x 1  . Toe amputation Left 2012  . Colonoscopy  2000?    negative    Current Outpatient Prescriptions  Medication Sig Dispense Refill  . amLODipine (NORVASC) 5 MG tablet Take 1 tablet (5 mg total) by mouth daily. 180 tablet 3  . carvedilol (COREG) 25 MG tablet TAKE 1 TABLET BY MOUTH TWICE DAILY WITH A MEAL 180 tablet 6  . ELIQUIS 5 MG TABS tablet TAKE 1 TABLET (5 MG TOTAL) BY MOUTH 2 (TWO) TIMES DAILY. 60 tablet 11  . furosemide (LASIX) 40 MG tablet Take 1 tablet (40 mg total) by mouth 2 (two) times daily. TAKE 1 TAB IN Anthony AM AND 1 TAB AT 2 PM 180 tablet 3  . insulin NPH-regular Human (NOVOLIN 70/30) (70-30) 100 UNIT/ML injection Inject 60units in morning, 20 units at night 110 mL 3  . isosorbide mononitrate (IMDUR) 60 MG 24 hr tablet TAKE 1 TABLET (60 MG TOTAL) BY MOUTH DAILY. 90 tablet 1  . losartan (COZAAR) 100 MG tablet TAKE 1 TABLET (100 MG TOTAL) BY MOUTH DAILY. 90 tablet 2  . metFORMIN (GLUCOPHAGE) 1000 MG tablet TAKE 1 TABLET BY MOUTH TWICE DAILY WITH A MEAL 120 tablet 3  . Multiple Vitamin (MULTIVITAMIN) tablet Take 1 tablet by mouth daily.      . nitroGLYCERIN (NITROSTAT) 0.4 MG SL tablet Place 0.4 mg under Anthony tongue every 5 (five) minutes as needed. If no relief by 3rd tab, call 911     . Omega 3 1000 MG CAPS Take 3 capsules by mouth daily.    Marland Kitchen oxymorphone (OPANA) 5 MG tablet Take 5-10 mg by mouth every 8 (eight) hours. Per Dr Brien Few    . PARoxetine (PAXIL) 40 MG tablet TAKE 1 TABLET  BY MOUTH EVERY MORNING 90 tablet 3  . potassium chloride SA (K-DUR,KLOR-CON) 20 MEQ tablet Take 1 tablet (20 mEq total) by mouth daily. 90 tablet 3  . ranitidine (ZANTAC) 150 MG tablet Take 150 mg by mouth daily.    . simvastatin (ZOCOR) 20 MG tablet TAKE 1 TABLET (20 MG TOTAL) BY MOUTH AT BEDTIME. 90 tablet 3   No current facility-administered medications for this visit.    Allergies as of 10/31/2015 - Review Complete 10/31/2015  Allergen Reaction Noted  . Enalapril maleate Cough 08/19/2005    Vitals: BP 156/88 mmHg  Pulse 78  Resp 20  Ht 6\' 1"  (1.854 m)  Wt 231 lb (104.781 kg)  BMI 30.48 kg/m2 Last Weight:  Wt Readings from Last 1 Encounters:  10/31/15 231 lb (104.781 kg)   Last Height:   Ht Readings from Last 1 Encounters:  10/31/15 6\' 1"  (1.854 m)    Physical exam:  General: Anthony [name] is awake, alert and appears not in acute distress. Anthony [name] is well groomed. Head: Normocephalic, atraumatic. Neck is supple. Mallampati 3, neck circumference: 16.5 inches.  No nasal septal  deviation.  Cardiovascular:  Regular rate and rhythm  without  murmurs or carotid bruit, and without distended neck veins. Respiratory: Lungs are clear to auscultation. Skin:  Without evidence of edema, or rash Trunk: BMI is elevated, but Anthony [name] has normal posture.  Neurologic exam : Anthony [name] is awake and alert, oriented to place and time. Memory subjective described as intact.  Cranial nerves: Pupils are equal and briskly reactive to light. Extraocular movements  in vertical and horizontal planes intact and without nystagmus. Visual fields  by finger perimetry are intact. Hearing to finger rub intact.  Facial sensation intact to fine touch. Facial motor strength is symmetric and tongue and uvula move midline.  Motor exam:   Normal tone and normal muscle bulk - symmetric  strength in all extremities. Strong grip. He reports trouble with screwdriver use. Cramping at Anthony wrist. Spasms  In  lower extremities. No RLS.   Sensory:  Fine touch, pinprick and vibration were tested in all extremities. Reduced pin prick, vibration and filament touch in both feet.  Coordination: Rapid alternating movements in Anthony fingers/hands is tested and normal. Finger-to-nose maneuverwithout evidence  of ataxia, dysmetria or tremor.  Gait and station: intact.    Assessment:  / PLAN  After physical and neurologic examination, review of  pre-existing records. 1) confirmed OSA, central apneas not noted,   Compliance was  Improved in 2016 noe declined  , EDS is improved as documented by epworth, nocturia and snoring are treated on CPAP.  2) CAD-  He had 2 heart atatcks, 2 stents were placed in 2010-2001. High risk for OSA , BMI elevated and higher grade mallompati . 3) tobacco use He smokes 8- 1 cig a day ,. He has no prolonged desaturations during Anthony sleep study 2014.  4) obesity  5) chronic pain,  Narcotic opiate therapy reduced to three doses a day,  Followed actively by Dr Brien Few.   Plan:  Continue using your CPAP. Rv in 12 month.  I have asked Anthony [name] to increase his compliance he has not used Anthony machine daily not even every other day at this time. I need him to use Anthony machine 4 hours minimum each day of Anthony week if for any reason he has to skip using Anthony machine for example because of sinusitis or bronchitis I needed to make up for it with some additional hours. He may also use Anthony CPAP when he naps.  He explained that he is a side sleeper and that his current interface does not work well for this purpose. We'll be happy to prescribe Anthony machine interface that is smaller and therefore easier allowing him to sleep on his side. He does have a mustache that not a full face. I think a nasal pillow would be fine for him-  I suggest an air fit  P 10 in large size.   I have explained Anthony low compliance concern to te [name] . His insurance may not cover supplies if he is not using Anthony CPAP on average 4  hours. Anthony treatment of obstructive sleep apnea is also known to help prevent paroxysmal atrial fibrillation, or Anthony reconversion after cardioversion into atrial fibrillation. He follows up with Dr. Ena Dawley.  Everlee Quakenbush, MD   Cc Dr Brien Few.

## 2015-11-14 DIAGNOSIS — G4733 Obstructive sleep apnea (adult) (pediatric): Secondary | ICD-10-CM | POA: Diagnosis not present

## 2015-11-16 ENCOUNTER — Other Ambulatory Visit: Payer: Self-pay | Admitting: *Deleted

## 2015-11-16 DIAGNOSIS — I4891 Unspecified atrial fibrillation: Secondary | ICD-10-CM

## 2015-11-16 DIAGNOSIS — R0600 Dyspnea, unspecified: Secondary | ICD-10-CM

## 2015-11-16 DIAGNOSIS — R0609 Other forms of dyspnea: Secondary | ICD-10-CM

## 2015-11-16 DIAGNOSIS — Z8679 Personal history of other diseases of the circulatory system: Secondary | ICD-10-CM

## 2015-11-16 DIAGNOSIS — I5033 Acute on chronic diastolic (congestive) heart failure: Secondary | ICD-10-CM

## 2015-11-16 MED ORDER — FUROSEMIDE 40 MG PO TABS: 40.0000 mg | ORAL_TABLET | Freq: Two times a day (BID) | ORAL | Status: DC

## 2015-11-27 MED FILL — ELIQUIS 5 MG TABLET: 5 | 30 days supply | Qty: 60 | Fill #2

## 2015-12-26 ENCOUNTER — Ambulatory Visit (INDEPENDENT_AMBULATORY_CARE_PROVIDER_SITE_OTHER): Payer: PPO

## 2015-12-26 ENCOUNTER — Ambulatory Visit (INDEPENDENT_AMBULATORY_CARE_PROVIDER_SITE_OTHER): Payer: PPO | Admitting: Podiatry

## 2015-12-26 ENCOUNTER — Encounter: Payer: Self-pay | Admitting: Podiatry

## 2015-12-26 DIAGNOSIS — M129 Arthropathy, unspecified: Secondary | ICD-10-CM

## 2015-12-26 DIAGNOSIS — M79609 Pain in unspecified limb: Secondary | ICD-10-CM

## 2015-12-26 DIAGNOSIS — E1142 Type 2 diabetes mellitus with diabetic polyneuropathy: Secondary | ICD-10-CM | POA: Diagnosis not present

## 2015-12-26 DIAGNOSIS — M6789 Other specified disorders of synovium and tendon, multiple sites: Secondary | ICD-10-CM

## 2015-12-26 DIAGNOSIS — R52 Pain, unspecified: Secondary | ICD-10-CM | POA: Diagnosis not present

## 2015-12-26 DIAGNOSIS — M76829 Posterior tibial tendinitis, unspecified leg: Secondary | ICD-10-CM

## 2015-12-26 DIAGNOSIS — B351 Tinea unguium: Secondary | ICD-10-CM | POA: Diagnosis not present

## 2015-12-26 DIAGNOSIS — M19079 Primary osteoarthritis, unspecified ankle and foot: Secondary | ICD-10-CM

## 2015-12-26 NOTE — Progress Notes (Signed)
He presents today chief complaint of painful elongated toenails and some tenderness to the posterior tibial tendon of the left medial foot.  Objective: Vital signs are stable he's alert and oriented 3 pulses are palpable. He has some tenderness on palpation and insertion site of the posterior tibial tendon left. Radiographs do not demonstrate any Osseous abnormalities other than osteoarthritis. He has thick yellow dystrophic onychomycotic nails.  Assessment: Osteoarthritis of posterior tibial tendon dysfunction left. Painful elongated toenails. Diabetes mellitus with diabetic peripheral neuropathy.  Plan: Debridement of nails bilaterally today follow up with me as needed.

## 2015-12-27 ENCOUNTER — Encounter: Payer: Self-pay | Admitting: Family Medicine

## 2015-12-27 DIAGNOSIS — Z961 Presence of intraocular lens: Secondary | ICD-10-CM | POA: Diagnosis not present

## 2015-12-27 DIAGNOSIS — E113391 Type 2 diabetes mellitus with moderate nonproliferative diabetic retinopathy without macular edema, right eye: Secondary | ICD-10-CM | POA: Diagnosis not present

## 2015-12-27 DIAGNOSIS — E113392 Type 2 diabetes mellitus with moderate nonproliferative diabetic retinopathy without macular edema, left eye: Secondary | ICD-10-CM | POA: Diagnosis not present

## 2015-12-27 DIAGNOSIS — H52203 Unspecified astigmatism, bilateral: Secondary | ICD-10-CM | POA: Diagnosis not present

## 2015-12-27 LAB — HM DIABETES EYE EXAM

## 2016-01-01 MED FILL — ELIQUIS 5 MG TABLET: 5 | 30 days supply | Qty: 60 | Fill #3

## 2016-01-02 ENCOUNTER — Encounter: Payer: Self-pay | Admitting: Family Medicine

## 2016-01-02 ENCOUNTER — Ambulatory Visit (INDEPENDENT_AMBULATORY_CARE_PROVIDER_SITE_OTHER): Payer: PPO | Admitting: Family Medicine

## 2016-01-02 VITALS — BP 128/94 | HR 104 | Temp 98.4°F | Ht 73.0 in | Wt 242.6 lb

## 2016-01-02 DIAGNOSIS — E1165 Type 2 diabetes mellitus with hyperglycemia: Secondary | ICD-10-CM

## 2016-01-02 DIAGNOSIS — E118 Type 2 diabetes mellitus with unspecified complications: Secondary | ICD-10-CM

## 2016-01-02 DIAGNOSIS — Z720 Tobacco use: Secondary | ICD-10-CM | POA: Diagnosis not present

## 2016-01-02 DIAGNOSIS — Z794 Long term (current) use of insulin: Secondary | ICD-10-CM | POA: Diagnosis not present

## 2016-01-02 DIAGNOSIS — E119 Type 2 diabetes mellitus without complications: Secondary | ICD-10-CM

## 2016-01-02 DIAGNOSIS — R600 Localized edema: Secondary | ICD-10-CM | POA: Diagnosis not present

## 2016-01-02 LAB — POCT GLYCOSYLATED HEMOGLOBIN (HGB A1C): Hemoglobin A1C: 6.5

## 2016-01-02 MED ORDER — NICOTINE 7 MG/24HR TD PT24: 7.0000 mg | MEDICATED_PATCH | Freq: Every day | TRANSDERMAL | 0 refills | Status: DC

## 2016-01-02 NOTE — Assessment & Plan Note (Signed)
-  Interested in smoking cessation -Prescription for nicotine patches sent to pharmacy

## 2016-01-02 NOTE — Assessment & Plan Note (Addendum)
-  A1c improved to 6.5 today, down from last A1c of 6.8 in April 2017 -Will check BMP as well as protein creatinine ratio -Continue Novolin 70/30 at 70 units in the morning and metformin 1000 mg twice a day -Recommend he replace glucometer when able -Follow-up in 3 months for A1c check. If A1c continuing to decline or is stable, consider titrating insulin down.

## 2016-01-02 NOTE — Patient Instructions (Signed)
Thank you so much for coming to visit today! You're doing a great job with your diabetes! Your A1C is 6.5 today. Please continue your current diabetes regimen. Please get a blood sugar monitor so you can check your blood sugar every once in a while. If your A1C continues to go down, we will discuss tapering down your insulin.  I have sent a prescription for Nicotine patches to your pharmacy. You may use one patch a day. Per the pharmacist, without seeing what type of patches your pharmacy gives you, it is difficult to say if you can cut them in half. If you cut them and something oozes out, it is a sign that it should not be cut and worn.  Please take two tablets of your Lasix twice a day for the next 3-4 days. Please schedule a follow up appointment with your Cardiologist.  Thanks again! Dr. Gerlean Ren

## 2016-01-02 NOTE — Assessment & Plan Note (Signed)
-  Bilateral lower extremity edema noted today -Weight increased from 231 pounds in June to 242 pounds today -Multiple etiologies possible. Possible component from known heart failure, currently followed by cardiology. Also has history of vascular insufficiency, which would fit with his story of worsening at the end of the day and improvement with elevation. Is also prescribed amlodipine which has been known to cause lower extremity edema. Liver dysfunction is also in the differential, however liver function tests were normal in February 2017. I suspect the most likely etiology is the combination of vascular insufficiency and heart failure. -Will increase furosemide to 80 mg twice a day for the next 3 days -Recommend compression stockings and elevation -Follow up with cardiology

## 2016-01-02 NOTE — Progress Notes (Signed)
Subjective:     Patient ID: Anthony [name], male   DOB: 1951/01/03, 65 y.o.   MRN: [id number]  HPI Anthony [name] is a 65yo male presenting today for diabetes follow up.  # Diabetes: - Currently taking Novolin 70/30 70units in the morning as well as Metformin 1000 mg twice daily - Last seen in April 2017 for diabetes. A1c of 6.8. - Last seen by podiatry August 1. Debridement of nails bilaterally with plan to follow-up as needed -Reports he was seen by ophthalmology last week. Ophthalmology report reviewed and indicates mild diabetic retinopathy with no treatment indicated. -Has not been checking his blood sugar. Reports the battery died in his glucometer and he has been unable to replace it -Reports he is been eating well and exercising  # Lower Extremity Swelling: -Reports lower extremity swelling bilaterally for the last several weeks -Notes history of congestive heart failure. Followed by cardiology last seen in April 2017. Echocardiogram obtained in September 2015 with EF 55-60%, severely dilated left atrium, moderately dilated right atrium. - Prescribed amlodipine 5 mg daily - Prescribed her furosemide 40 mg twice a day - Reports swelling is worse at the end of the day and improved with elevation - Denies shortness of breath, orthopnea, or paroxysmal nocturnal dyspnea however does report history of sleep apnea and uses CPAP nightly. Denies chest pain.  # Tobacco Abuse: -History of smoking noted reports he smokes 10 Menthol lites daily. Has previously tried to quit using nicotine gum however he reports even the lowest dose made him sick. Is amenable to other nicotine sources to help quit smoking  Review of Systems Per HPI. Other systems negative.    Objective:   Physical Exam General: 65 y.o. male resting comfortably in no apparent distress Cardiac: S1 and S2 noted. No murmurs, rubs, or gallops. Regular rate and rhythm. Pulm: Clear to auscultation bilaterally. No wheezes/rales/rhonchi.  No increased work of breathing noted. Abd: Bowel sounds noted. Soft and nondistended. No tenderness or masses to palpation. Extremities: 2+ edema to mid tibia noted. Pulses palpated. Neuro: No focal deficits. AAOx3. Sensation to be intact bilaterally. Skin: No rashes noted. Warm and dry.     Assessment and Plan:     DM (diabetes mellitus), type 2, uncontrolled -A1c improved to 6.5 today, down from last A1c of 6.8 in April 2017 -Will check BMP as well as protein creatinine ratio -Continue Novolin 70/30 at 70 units in the morning and metformin 1000 mg twice a day -Recommend he replace glucometer when able -Follow-up in 3 months for A1c check. If A1c continuing to decline or is stable, consider titrating insulin down.  Lower extremity edema -Bilateral lower extremity edema noted today -Weight increased from 231 pounds in June to 242 pounds today -Multiple etiologies possible. Possible component from known heart failure, currently followed by cardiology. Also has history of vascular insufficiency, which would fit with his story of worsening at the end of the day and improvement with elevation. Is also prescribed amlodipine which has been known to cause lower extremity edema. Liver dysfunction is also in the differential, however liver function tests were normal in February 2017. I suspect the most likely etiology is the combination of vascular insufficiency and heart failure. -Will increase furosemide to 80 mg twice a day for the next 3 days -Recommend compression stockings and elevation -Follow up with cardiology  Tobacco abuse -Interested in smoking cessation -Prescription for nicotine patches sent to pharmacy

## 2016-01-03 LAB — BASIC METABOLIC PANEL WITH GFR
BUN: 9 mg/dL (ref 7–25)
CO2: 25 mmol/L (ref 20–31)
Calcium: 8.7 mg/dL (ref 8.6–10.3)
Chloride: 106 mmol/L (ref 98–110)
Creat: 1.53 mg/dL — ABNORMAL HIGH (ref 0.70–1.25)
GFR, Est African American: 55 mL/min — ABNORMAL LOW (ref >=60)
GFR, Est Non African American: 47 mL/min — ABNORMAL LOW (ref >=60)
Glucose, Bld: 61 mg/dL — ABNORMAL LOW (ref 65–99)
Potassium: 3.7 mmol/L (ref 3.5–5.3)
Sodium: 143 mmol/L (ref 135–146)

## 2016-01-03 LAB — PROTEIN / CREATININE RATIO, URINE
Creatinine, Urine: 37 mg/dL (ref 20–370)
Protein Creatinine Ratio: 1324 mg/g creat — ABNORMAL HIGH (ref 22–128)
Total Protein, Urine: 49 mg/dL — ABNORMAL HIGH (ref 5–25)

## 2016-01-04 ENCOUNTER — Telehealth: Payer: Self-pay | Admitting: Cardiology

## 2016-01-04 ENCOUNTER — Telehealth: Payer: Self-pay | Admitting: Family Medicine

## 2016-01-04 DIAGNOSIS — N183 Chronic kidney disease, stage 3 unspecified: Secondary | ICD-10-CM

## 2016-01-04 NOTE — Telephone Encounter (Signed)
New message     Pt c/o swelling: STAT is pt has developed SOB within 24 hours  1. How long have you been experiencing swelling? A week  2. Where is the swelling located? Both legs down to the ankles  3.  Are you currently taking a "fluid pill"? yes  4.  Are you currently SOB? no  5.  Have you traveled recently? No    Pt would like an earlier appt than the 1 scheduled with Dr. Meda Coffee in Oct. Please call. He is extremely concerned about the swelling.

## 2016-01-04 NOTE — Telephone Encounter (Signed)
Pt calling to inform Dr Meda Coffee that over the course of 2 weeks his Anthony [name] has worsened, in both legs.  Pt states that he has DOE as well. Pt states he contacted his PCP on Monday and he advised the pt to double up on his Lasix to see if this helps with his symptoms, and if it doesn't to contact his Cardiologist. Pt states this helped a little bit, but still has swelling in his lower extremities.  Pt states by the end of the day his legs are so swollen, they begin to become extremely painful. Pt states he doesn't have an appt with Dr Meda Coffee until Oct, and feels this needs to be addressed sooner.  Pt states he has minimized his sodium intake to help reduce the swelling.  Pt states that he his elevating his feet up when he's resting, to reduce swelling.  Informed the pt that Dr Meda Coffee is out of the office the rest of this week, but I can offer him an appt on next Monday 8/14 at 0830, for recent cancellation.  Informed the pt that he needs to arrive at 0815 that morning.  Advised the pt to continue doing as his PCP has advised with his lasix, until he see's Dr Meda Coffee.  Advised the pt to continue keeping his sodium intake to a minimum. Advised the pt to continue elevating his legs at rest.  Advised the pt that between now and next Monday if his symptoms worsen, like increased sob, chest pain, increased DOE, or any other cardiac issues, then he should seek immediate medical attention.  Pt verbalized understanding and agrees with this plan.  Pt very gracious for all the assistance with getting his appt moved up.  Will keep pts follow-up appt for Oct with Dr Meda Coffee there, until further advised at his 8/14 appt to cancel.  Will route this message to Dr Meda Coffee to make her aware of pts issue, and coming in on Monday 8/14 to see her in clinic.

## 2016-01-04 NOTE — Telephone Encounter (Signed)
Attempted to contact concerning lab results. Creatinine trending up, 1.53 today up from 1.45. Also has protein in urine. Already on Losartan, which is recommended with proteinuria and allergy to ACE inhibitors. Will refer to Neurology.

## 2016-01-05 NOTE — Telephone Encounter (Signed)
Patient informed, he states he sees his cardiologist next week. He will discuss this with him and let MD know if he wishes to proceed with nephrology referral. Juluis Rainier to MD

## 2016-01-08 ENCOUNTER — Ambulatory Visit (INDEPENDENT_AMBULATORY_CARE_PROVIDER_SITE_OTHER): Payer: PPO | Admitting: Cardiology

## 2016-01-08 ENCOUNTER — Encounter: Payer: Self-pay | Admitting: Cardiology

## 2016-01-08 VITALS — BP 140/74 | HR 114 | Ht 73.0 in | Wt 237.8 lb

## 2016-01-08 DIAGNOSIS — I4891 Unspecified atrial fibrillation: Secondary | ICD-10-CM | POA: Diagnosis not present

## 2016-01-08 DIAGNOSIS — I10 Essential (primary) hypertension: Secondary | ICD-10-CM | POA: Diagnosis not present

## 2016-01-08 DIAGNOSIS — E7800 Pure hypercholesterolemia, unspecified: Secondary | ICD-10-CM | POA: Diagnosis not present

## 2016-01-08 DIAGNOSIS — I5033 Acute on chronic diastolic (congestive) heart failure: Secondary | ICD-10-CM

## 2016-01-08 MED ORDER — POTASSIUM CHLORIDE ER 10 MEQ PO TBCR: 10.0000 meq | EXTENDED_RELEASE_TABLET | Freq: Every day | ORAL | 3 refills | Status: DC

## 2016-01-08 MED ORDER — FUROSEMIDE 80 MG PO TABS: 80.0000 mg | ORAL_TABLET | Freq: Every day | ORAL | 3 refills | Status: DC

## 2016-01-08 NOTE — Patient Instructions (Addendum)
Medication Instructions:   START TAKING LASIX 80 MG ONCE DAILY  START TAKING POTASSIUM CHLORIDE 10 mEq ONCE DAILY   Labwork:  KEEP YOUR LAB APPOINTMENT FOR 02/28/16--COME FASTING TO THIS LAB APPOINTMENT    Follow-Up:  KEEP YOUR SCHEDULED APPOINTMENT WITH DR Meda Coffee FOR 03/05/16 AT 10:15 AM      If you need a refill on your cardiac medications before your next appointment, please call your pharmacy.

## 2016-01-08 NOTE — Progress Notes (Signed)
Patient ID: Anthony [name], male   DOB: Oct 06, 1950, 65 y.o.   MRN: [id number]    01/08/2016  PCP: Junie Panning, DO  Chief complain: DOE  Primary Cardiologist: Dr. Verl Blalock now Dr. Meda Coffee  HPI:  65 year-old male with previous history of acute MI and in 2001 , congestive heart failure may be diastolic, coronary artery disease ( 2001 cardiac cath revealed nonobstructive coronary disease with an EF of 60%).  In 2003 patient again had chest pain underwent cardiac cath with successful stenting of the circumflex lesion extending into the second obtuse marginal branch with a drug-eluting stent.  Myoview 2007 with no ischemia and EF of 59%.   Pt has with him an EKG from 2001 that also has a fib.  All other EKGs in the computer are with SR.    Pt was at opthamologist on Thursday the 11th of June and he was found to be in A. fib with RVR.  The office was called and it our office instructed the patient to go to the emergency room. In the initial EKG heart rate was 141.  In the emergency room his rate was improved control 110 though A. fib continued. His troponin was negative the ER note states patient will followup in the office.  He underwent a stress test in July that was negative for prior MI or ischemia. He denies any further chest pain or SOB. No palpitations. No syncope. Some orthostatic hypotension when he stands up.  He is complaint with his meds.  His Chads2Vas2  score is 4. No bruising from Eliquis.  01/08/2016, the patient called last week with worsening shortness of breath and lower extremity edema, also with increase in weight of 5 pounds, he was instructed to start using Lasix 80 mg by mouth twice a day, this resulted in 5 pound weight loss and improvement of his symptoms. His repeat creatinine was 1.5, his baseline is 1.2-1.4. He denies any chest pain no residual proximal nocturnal dyspnea orthopnea or lower extremity edema.  Allergies  Allergen Reactions  . Enalapril Maleate Cough   REACTION: cough   Current Outpatient Prescriptions  Medication Sig Dispense Refill  . amLODipine (NORVASC) 5 MG tablet Take 1 tablet (5 mg total) by mouth daily. 180 tablet 3  . carvedilol (COREG) 25 MG tablet TAKE 1 TABLET BY MOUTH TWICE DAILY WITH A MEAL 180 tablet 6  . ELIQUIS 5 MG TABS tablet TAKE 1 TABLET (5 MG TOTAL) BY MOUTH 2 (TWO) TIMES DAILY. 60 tablet 11  . furosemide (LASIX) 40 MG tablet Take 1 tablet (40 mg total) by mouth 2 (two) times daily. 180 tablet 2  . insulin NPH-regular Human (NOVOLIN 70/30) (70-30) 100 UNIT/ML injection Inject 60units in morning, 20 units at night 110 mL 3  . isosorbide mononitrate (IMDUR) 60 MG 24 hr tablet TAKE 1 TABLET (60 MG TOTAL) BY MOUTH DAILY. 90 tablet 1  . losartan (COZAAR) 100 MG tablet TAKE 1 TABLET (100 MG TOTAL) BY MOUTH DAILY. 90 tablet 2  . metFORMIN (GLUCOPHAGE) 1000 MG tablet TAKE 1 TABLET BY MOUTH TWICE DAILY WITH A MEAL 120 tablet 3  . Multiple Vitamin (MULTIVITAMIN) tablet Take 1 tablet by mouth daily.      . nicotine (NICODERM CQ - DOSED IN MG/24 HR) 7 mg/24hr patch Place 1 patch (7 mg total) onto the skin daily. 28 patch 0  . nitroGLYCERIN (NITROSTAT) 0.4 MG SL tablet Place 0.4 mg under the tongue every 5 (five) minutes as needed. If no relief  by 3rd tab, call 911     . Omega 3 1000 MG CAPS Take 3 capsules by mouth daily.    Marland Kitchen oxymorphone (OPANA) 5 MG tablet Take 5-10 mg by mouth every 8 (eight) hours. Per Dr Brien Few    . PARoxetine (PAXIL) 40 MG tablet TAKE 1 TABLET BY MOUTH EVERY MORNING 90 tablet 3  . potassium chloride SA (K-DUR,KLOR-CON) 20 MEQ tablet Take 1 tablet (20 mEq total) by mouth daily. 90 tablet 3  . ranitidine (ZANTAC) 150 MG tablet Take 150 mg by mouth daily.    . simvastatin (ZOCOR) 20 MG tablet TAKE 1 TABLET (20 MG TOTAL) BY MOUTH AT BEDTIME. 90 tablet 3   No current facility-administered medications for this visit.    Past Medical History:  Diagnosis Date  . Arthritis   . CHF (congestive heart failure) ([place])    . Depression   . Diabetes mellitus   . Edema of extremities   . Erectile dysfunction   . GERD (gastroesophageal reflux disease)   . Hyperlipidemia   . Hypertension   . Myocardial infarction ([place]) 2000&2001  . Obstructive apnea   . Onychomycosis   . Tubular adenoma of colon 10/2002  . Unspecified sleep apnea 02/22/2013   Past Surgical History:  Procedure Laterality Date  . ANGIOPLASTY  2001   stent x 1  . COLONOSCOPY  2000?   negative  . TOE AMPUTATION Left 2012   [id number] colds or fevers, no weight changes Skin:no rashes or ulcers HEENT:no blurred vision, no congestion CV:see HPI PUL:see HPI GI:no diarrhea constipation or melena, no indigestion GU:no hematuria, no dysuria MS:no joint pain, no claudication Neuro:no syncope,+ lightheadedness Endo:+ diabetes has been stable., no thyroid disease  Wt Readings from Last 3 Encounters:  01/08/16 237 lb 12.8 oz (107.9 kg)  01/02/16 242 lb 9.6 oz (110 kg)  10/31/15 231 lb (104.8 kg)   PHYSICAL EXAM BP 140/74   Pulse (!) 114   Ht 6\' 1"  (1.854 m)   Wt 237 lb 12.8 oz (107.9 kg)   BMI 31.37 kg/m  General:Pleasant affect, NAD Skin:Warm and dry, brisk capillary refill HEENT:normocephalic, sclera clear, mucus membranes moist Neck:supple, no JVD, no bruits , no adenopathy Heart:S1S2 irreg irreg rapid without murmur, gallup, rub or click Lungs:clear without rales, rhonchi, or wheezes [id number], non tender, + BS, do not palpate liver spleen or masses Ext: lower ext edema B/L 1+, 2+ radial pulses Neuro:alert and oriented, MAE, follows commands, + facial symmetry  EKG:A fib with HR 72 BPM  Septal infarct, inferior infarct  Lexiscan nuclear stress test: 12/01/2013 Impression Exercise Capacity: Lexiscan with no exercise. BP Response: Normal blood pressure response. Clinical Symptoms: There is dyspnea. ECG Impression: No significant ECG changes with Lexiscan. Comparison with Prior Nuclear Study: No images to compare  Overall  Impression: Low risk stress nuclear study with what  appears to be a small area of fixed perfusion defect that may be  consistent with a prior apical-lateral infarction.. No evidence  of ischemia.  TTE: 02/23/2014 - Left ventricle: The cavity size was normal. Wall thickness was increased in a pattern of moderate LVH. Systolic function was normal. The estimated ejection fraction was in the range of 55% to 60%. Wall motion was normal; there were no regional wall motion abnormalities. - Left atrium: The atrium was severely dilated. - Right atrium: The atrium was moderately dilated.    ASSESSMENT AND PLAN  1. Acute on chronic CHF - I will decrease Lasix from 80 mg by mouth  twice a day to 80 mg daily, patient is instructed to take an extra Lasix in the afternoon if his weight goes up by 3 pounds overnight or 5 pounds in a week. We will also decrease his potassium to 10 mEq daily. We will recheck his creatinine at the next visit.   2. CORONARY, ARTERIOSCLEROSIS  2003 with stent to the circumflex stenosis extending into the second obtuse marginal branch with DES. Last nuclear stress test July 2015 negative for ischemia, asymptomatic, no workup needed at this time.  3.  DM (diabetes mellitus), type 2, uncontrolled  Uncontrolled at times, insulin dependent   4. Hypertension - controlled.  5. HLP - on simvastatin, tolerated well  Follow up in 3 months with lipids CMP, CBC prior to the appointment .  Ena Dawley 01/08/2016

## 2016-01-10 ENCOUNTER — Telehealth: Payer: Self-pay | Admitting: Family Medicine

## 2016-01-10 NOTE — Telephone Encounter (Signed)
Patient brought form in to be completed for diabetic shoes. Please fax when complete to [phone].

## 2016-01-10 NOTE — Telephone Encounter (Signed)
Form placed in PCP box for completion. Zimmerman Rumple, Romolo Sieling D, CMA  

## 2016-01-11 ENCOUNTER — Encounter: Payer: Self-pay | Admitting: *Deleted

## 2016-01-11 ENCOUNTER — Other Ambulatory Visit: Payer: Self-pay | Admitting: *Deleted

## 2016-01-11 NOTE — Telephone Encounter (Signed)
To whom it May concern,  [name] is currently under my care and is cleared from cardiac standpoint to start an exercise program in a gym. He is informed about the limitations of his exercise.  Please call us with any questions.  Warm regards,  Ena Dawley, MD Cardiologist  [place]

## 2016-01-11 NOTE — Telephone Encounter (Signed)
Dr Meda Coffee we saw this pt in the clinic on Monday and he discussed with [place] that he wanted to find a local Gym to go to or attend [place].  Pt is not old enough for [employer] yet, but he has decided to become a member of [employer].  Before he can work-out there, he will need you to write a clearance letter stating its safe for him to proceed with his exercise regimen, at his gym of choice, [employer] [facility].  Informed the pt that once this letter is written, I will call him back to arrange for him to pick this up at his earliest convenience.  Pt verbalized understanding and agrees with this plan.

## 2016-01-11 NOTE — Telephone Encounter (Signed)
Follow up    Pt verified that he is calling for Anthony [name] about receiving a note to take to [place] that states that he can work out

## 2016-01-11 NOTE — Telephone Encounter (Signed)
Forms faxed to number provided, originals left up front for patient to pick up. Left message on voicemail informing patient.

## 2016-01-11 NOTE — Telephone Encounter (Signed)
Notified the pt that Dr Meda Coffee wrote his clearance letter for him to participate in exercise activities at [facility].  Informed the pt that its available for him to pick up at the front desk at his earliest convenience.  Pt verbalized understanding, agrees with this plan, and gracious for all the assistance provided.

## 2016-01-15 MED ORDER — PAROXETINE HCL 40 MG PO TABS: 40.0000 mg | ORAL_TABLET | Freq: Every morning | ORAL | 3 refills | Status: DC

## 2016-01-15 MED ORDER — METFORMIN HCL 1000 MG PO TABS: ORAL_TABLET | ORAL | 3 refills | Status: DC

## 2016-01-30 MED FILL — ELIQUIS 5 MG TABLET: 5 | 30 days supply | Qty: 60 | Fill #4

## 2016-02-01 DIAGNOSIS — Z6833 Body mass index (BMI) 33.0-33.9, adult: Secondary | ICD-10-CM | POA: Diagnosis not present

## 2016-02-01 DIAGNOSIS — I129 Hypertensive chronic kidney disease with stage 1 through stage 4 chronic kidney disease, or unspecified chronic kidney disease: Secondary | ICD-10-CM | POA: Diagnosis not present

## 2016-02-01 DIAGNOSIS — E1129 Type 2 diabetes mellitus with other diabetic kidney complication: Secondary | ICD-10-CM | POA: Diagnosis not present

## 2016-02-01 DIAGNOSIS — I4891 Unspecified atrial fibrillation: Secondary | ICD-10-CM | POA: Diagnosis not present

## 2016-02-01 DIAGNOSIS — I2510 Atherosclerotic heart disease of native coronary artery without angina pectoris: Secondary | ICD-10-CM | POA: Diagnosis not present

## 2016-02-01 DIAGNOSIS — Z955 Presence of coronary angioplasty implant and graft: Secondary | ICD-10-CM | POA: Diagnosis not present

## 2016-02-01 DIAGNOSIS — Z9989 Dependence on other enabling machines and devices: Secondary | ICD-10-CM | POA: Diagnosis not present

## 2016-02-01 DIAGNOSIS — G4733 Obstructive sleep apnea (adult) (pediatric): Secondary | ICD-10-CM | POA: Diagnosis not present

## 2016-02-01 DIAGNOSIS — Z720 Tobacco use: Secondary | ICD-10-CM | POA: Diagnosis not present

## 2016-02-01 DIAGNOSIS — N183 Chronic kidney disease, stage 3 (moderate): Secondary | ICD-10-CM | POA: Diagnosis not present

## 2016-02-01 DIAGNOSIS — E785 Hyperlipidemia, unspecified: Secondary | ICD-10-CM | POA: Diagnosis not present

## 2016-02-03 ENCOUNTER — Other Ambulatory Visit: Payer: Self-pay | Admitting: Nephrology

## 2016-02-03 DIAGNOSIS — N183 Chronic kidney disease, stage 3 unspecified: Secondary | ICD-10-CM

## 2016-02-03 DIAGNOSIS — E1322 Other specified diabetes mellitus with diabetic chronic kidney disease: Secondary | ICD-10-CM

## 2016-02-03 DIAGNOSIS — I129 Hypertensive chronic kidney disease with stage 1 through stage 4 chronic kidney disease, or unspecified chronic kidney disease: Secondary | ICD-10-CM

## 2016-02-07 ENCOUNTER — Ambulatory Visit
Admission: RE | Admit: 2016-02-07 | Discharge: 2016-02-07 | Disposition: A | Discharge status: Home / Self Care | Payer: PPO | Source: Ambulatory Visit | Attending: Nephrology | Admitting: Nephrology

## 2016-02-07 DIAGNOSIS — N183 Chronic kidney disease, stage 3 unspecified: Secondary | ICD-10-CM

## 2016-02-07 DIAGNOSIS — I129 Hypertensive chronic kidney disease with stage 1 through stage 4 chronic kidney disease, or unspecified chronic kidney disease: Secondary | ICD-10-CM

## 2016-02-07 DIAGNOSIS — E1322 Other specified diabetes mellitus with diabetic chronic kidney disease: Secondary | ICD-10-CM

## 2016-02-09 ENCOUNTER — Encounter: Payer: Self-pay | Admitting: Gastroenterology

## 2016-02-16 ENCOUNTER — Encounter: Payer: Self-pay | Admitting: Cardiology

## 2016-02-26 ENCOUNTER — Ambulatory Visit: Payer: PPO | Admitting: Gastroenterology

## 2016-02-28 ENCOUNTER — Other Ambulatory Visit: Payer: PPO | Admitting: *Deleted

## 2016-02-28 DIAGNOSIS — I10 Essential (primary) hypertension: Secondary | ICD-10-CM | POA: Diagnosis not present

## 2016-02-28 DIAGNOSIS — I4891 Unspecified atrial fibrillation: Secondary | ICD-10-CM | POA: Diagnosis not present

## 2016-02-28 DIAGNOSIS — E7800 Pure hypercholesterolemia, unspecified: Secondary | ICD-10-CM

## 2016-02-28 DIAGNOSIS — I5033 Acute on chronic diastolic (congestive) heart failure: Secondary | ICD-10-CM | POA: Diagnosis not present

## 2016-02-28 LAB — CBC WITH DIFFERENTIAL/PLATELET
Basophils Absolute: 0 cells/uL (ref 0–200)
Basophils Relative: 0 %
Eosinophils Absolute: 207 cells/uL (ref 15–500)
Eosinophils Relative: 3 %
HCT: 39.7 % (ref 38.5–50.0)
Hemoglobin: 13.7 g/dL (ref 13.2–17.1)
Lymphocytes Relative: 34 %
Lymphs Abs: 2346 cells/uL (ref 850–3900)
MCH: 28.7 pg (ref 27.0–33.0)
MCHC: 34.5 g/dL (ref 32.0–36.0)
MCV: 83.2 fL (ref 80.0–100.0)
MPV: 11 fL (ref 7.5–12.5)
Monocytes Absolute: 621 cells/uL (ref 200–950)
Monocytes Relative: 9 %
Neutro Abs: 3726 cells/uL (ref 1500–7800)
Neutrophils Relative %: 54 %
Platelets: 160 10*3/uL (ref 140–400)
RBC: 4.77 MIL/uL (ref 4.20–5.80)
RDW: 14.8 % (ref 11.0–15.0)
WBC: 6.9 10*3/uL (ref 3.8–10.8)

## 2016-02-28 LAB — COMPREHENSIVE METABOLIC PANEL
ALT: 21 U/L (ref 9–46)
AST: 21 U/L (ref 10–35)
Albumin: 3.4 g/dL — ABNORMAL LOW (ref 3.6–5.1)
Alkaline Phosphatase: 103 U/L (ref 40–115)
BUN: 13 mg/dL (ref 7–25)
CO2: 23 mmol/L (ref 20–31)
Calcium: 9.1 mg/dL (ref 8.6–10.3)
Chloride: 108 mmol/L (ref 98–110)
Creat: 1.55 mg/dL — ABNORMAL HIGH (ref 0.70–1.25)
Glucose, Bld: 157 mg/dL — ABNORMAL HIGH (ref 65–99)
Potassium: 3.4 mmol/L — ABNORMAL LOW (ref 3.5–5.3)
Sodium: 143 mmol/L (ref 135–146)
Total Bilirubin: 0.9 mg/dL (ref 0.2–1.2)
Total Protein: 6.1 g/dL (ref 6.1–8.1)

## 2016-02-28 LAB — LIPID PANEL
Cholesterol: 110 mg/dL — ABNORMAL LOW (ref 125–200)
HDL: 34 mg/dL — ABNORMAL LOW (ref >=40)
LDL Cholesterol: 57 mg/dL (ref <130)
Total CHOL/HDL Ratio: 3.2 Ratio (ref <=5.0)
Triglycerides: 94 mg/dL (ref <150)
VLDL: 19 mg/dL (ref <30)

## 2016-02-28 MED FILL — ELIQUIS 5 MG TABLET: 5 | 30 days supply | Qty: 60 | Fill #5

## 2016-02-29 ENCOUNTER — Telehealth: Payer: Self-pay | Admitting: *Deleted

## 2016-02-29 MED ORDER — POTASSIUM CHLORIDE CRYS ER 20 MEQ PO TBCR: 20.0000 meq | EXTENDED_RELEASE_TABLET | Freq: Every day | ORAL | 3 refills | Status: DC

## 2016-02-29 NOTE — Telephone Encounter (Signed)
    He has normal CBC, stable creatinine, low potassium increase potassium chloride from 10-->20 mEq daily. Normal LFTs.    Notified the pt of lab results per Dr Meda Coffee, as mentioned above.  Informed the pt that per Dr Meda Coffee, she recommends that we increase his K-dur to 20 mEq po daily.  Confirmed the pharmacy of choice with the pt. Pt verbalized understanding and agrees with this plan.

## 2016-02-29 NOTE — Telephone Encounter (Signed)
-----   Message from Dorothy Spark, MD sent at 02/29/2016  2:49 PM EDT ----- He has normal CBC, stable creatinine, low potassium increase potassium chloride from 10-->20 mEq daily. Normal LFTs.

## 2016-03-05 ENCOUNTER — Ambulatory Visit (INDEPENDENT_AMBULATORY_CARE_PROVIDER_SITE_OTHER): Payer: PPO | Admitting: Cardiology

## 2016-03-05 ENCOUNTER — Other Ambulatory Visit: Payer: Self-pay | Admitting: Cardiology

## 2016-03-05 VITALS — BP 136/62 | HR 80 | Ht 73.0 in | Wt 241.0 lb

## 2016-03-05 DIAGNOSIS — E876 Hypokalemia: Secondary | ICD-10-CM

## 2016-03-05 DIAGNOSIS — I5033 Acute on chronic diastolic (congestive) heart failure: Secondary | ICD-10-CM | POA: Diagnosis not present

## 2016-03-05 DIAGNOSIS — I10 Essential (primary) hypertension: Secondary | ICD-10-CM

## 2016-03-05 DIAGNOSIS — E7800 Pure hypercholesterolemia, unspecified: Secondary | ICD-10-CM

## 2016-03-05 DIAGNOSIS — I2510 Atherosclerotic heart disease of native coronary artery without angina pectoris: Secondary | ICD-10-CM | POA: Diagnosis not present

## 2016-03-05 NOTE — Patient Instructions (Signed)
Medication Instructions:   Your physician recommends that you continue on your current medications as directed. Please refer to the Current Medication list given to you today.   Labwork:  PRIOR TO YOUR 6 MONTH FOLLOW-UP APPOINTMENT WITH DR Meda Coffee TO CHECK A ---BMET    Follow-Up:  Your physician wants you to follow-up in: [place] will receive a reminder letter in the mail two months in advance. If you don't receive a letter, please call our office to schedule the follow-up appointment.  PLEASE HAVE YOUR BMET (LAB WORK) DONE A WEEK OR SO PRIOR TO THIS APPOINTMENT      If you need a refill on your cardiac medications before your next appointment, please call your pharmacy.

## 2016-03-05 NOTE — Progress Notes (Signed)
Patient ID: Anthony [name], male   DOB: Dec 17, 1950, 65 y.o.   MRN: [id number]    03/05/2016  PCP: Junie Panning, DO  Chief complain: DOE  Primary Cardiologist: Dr. Verl Blalock now Dr. Meda Coffee  HPI:  65 year-old male with previous history of acute MI and in 2001 , congestive heart failure may be diastolic, coronary artery disease ( 2001 cardiac cath revealed nonobstructive coronary disease with an EF of 60%).  In 2003 patient again had chest pain underwent cardiac cath with successful stenting of the circumflex lesion extending into the second obtuse marginal branch with a drug-eluting stent.  Myoview 2007 with no ischemia and EF of 59%.   Pt has with him an EKG from 2001 that also has a fib.  All other EKGs in the computer are with SR.    Pt was at opthamologist on Thursday the 11th of June and he was found to be in A. fib with RVR.  The office was called and it our office instructed the patient to go to the emergency room. In the initial EKG heart rate was 141.  In the emergency room his rate was improved control 110 though A. fib continued. His troponin was negative the ER note states patient will followup in the office.  He underwent a stress test in July that was negative for prior MI or ischemia. He denies any further chest pain or SOB. No palpitations. No syncope. Some orthostatic hypotension when he stands up.  He is complaint with his meds.  His Chads2Vas2  score is 4. No bruising from Eliquis.  01/08/2016, the patient called last week with worsening shortness of breath and lower extremity edema, also with increase in weight of 5 pounds, he was instructed to start using Lasix 80 mg by mouth twice a day, this resulted in 5 pound weight loss and improvement of his symptoms. His repeat creatinine was 1.5, his baseline is 1.2-1.4. He denies any chest pain no residual proximal nocturnal dyspnea orthopnea or lower extremity edema.  03/05/16 - the patient is coming after 2 months, at the last visit  the increase his furosemide but then decreased because of worsening kidney function. Currently on furosemide 80 mg daily with no lower extremity edema stable dyspnea on exertion no orthopnea or proximal nocturnal dyspnea no chest pain palpitations or syncope. He's been compliant to his meds and no side effects with statins.  Allergies  Allergen Reactions  . Enalapril Maleate Cough    REACTION: cough   Current Outpatient Prescriptions  Medication Sig Dispense Refill  . amLODipine (NORVASC) 5 MG tablet Take 1 tablet (5 mg total) by mouth daily. 180 tablet 3  . carvedilol (COREG) 25 MG tablet TAKE 1 TABLET BY MOUTH TWICE DAILY WITH A MEAL 180 tablet 6  . ELIQUIS 5 MG TABS tablet TAKE 1 TABLET (5 MG TOTAL) BY MOUTH 2 (TWO) TIMES DAILY. 60 tablet 11  . furosemide (LASIX) 80 MG tablet Take 1 tablet (80 mg total) by mouth daily. 90 tablet 3  . insulin NPH-regular Human (NOVOLIN 70/30) (70-30) 100 UNIT/ML injection Inject 60units in morning, 20 units at night 110 mL 3  . isosorbide mononitrate (IMDUR) 60 MG 24 hr tablet TAKE 1 TABLET (60 MG TOTAL) BY MOUTH DAILY. 90 tablet 1  . losartan (COZAAR) 100 MG tablet TAKE 1 TABLET (100 MG TOTAL) BY MOUTH DAILY. 90 tablet 2  . metFORMIN (GLUCOPHAGE) 1000 MG tablet TAKE 1 TABLET BY MOUTH TWICE DAILY WITH A MEAL 120 tablet 3  .  Multiple Vitamin (MULTIVITAMIN) tablet Take 1 tablet by mouth daily.      . nicotine (NICODERM CQ - DOSED IN MG/24 HR) 7 mg/24hr patch Place 1 patch (7 mg total) onto the skin daily. 28 patch 0  . nitroGLYCERIN (NITROSTAT) 0.4 MG SL tablet Place 0.4 mg under the tongue every 5 (five) minutes as needed. If no relief by 3rd tab, call 911     . Omega 3 1000 MG CAPS Take 3 capsules by mouth daily.    Marland Kitchen oxymorphone (OPANA) 5 MG tablet Take 5-10 mg by mouth every 8 (eight) hours. Per Dr Brien Few    . PARoxetine (PAXIL) 40 MG tablet Take 1 tablet (40 mg total) by mouth every morning. 90 tablet 3  . potassium chloride SA (K-DUR,KLOR-CON) 20 MEQ  tablet Take 1 tablet (20 mEq total) by mouth daily. 90 tablet 3  . ranitidine (ZANTAC) 150 MG tablet Take 150 mg by mouth daily.    . simvastatin (ZOCOR) 20 MG tablet TAKE 1 TABLET (20 MG TOTAL) BY MOUTH AT BEDTIME. 90 tablet 3   No current facility-administered medications for this visit.    Past Medical History:  Diagnosis Date  . Arthritis   . CHF (congestive heart failure) ([place])   . Depression   . Diabetes mellitus   . Edema of extremities   . Erectile dysfunction   . GERD (gastroesophageal reflux disease)   . Hyperlipidemia   . Hypertension   . Myocardial infarction 2000&2001  . Obstructive apnea   . Onychomycosis   . Tubular adenoma of colon 10/2002  . Unspecified sleep apnea 02/22/2013   Past Surgical History:  Procedure Laterality Date  . ANGIOPLASTY  2001   stent x 1  . COLONOSCOPY  2000?   negative  . TOE AMPUTATION Left 2012   [id number] colds or fevers, no weight changes Skin:no rashes or ulcers HEENT:no blurred vision, no congestion CV:see HPI PUL:see HPI GI:no diarrhea constipation or melena, no indigestion GU:no hematuria, no dysuria MS:no joint pain, no claudication Neuro:no syncope,+ lightheadedness Endo:+ diabetes has been stable., no thyroid disease  Wt Readings from Last 3 Encounters:  03/05/16 241 lb (109.3 kg)  01/08/16 237 lb 12.8 oz (107.9 kg)  01/02/16 242 lb 9.6 oz (110 kg)   PHYSICAL EXAM BP 136/62   Pulse 80   Ht '6\' 1"'$  (1.854 m)   Wt 241 lb (109.3 kg)   BMI 31.80 kg/m  General:Pleasant affect, NAD Skin:Warm and dry, brisk capillary refill HEENT:normocephalic, sclera clear, mucus membranes moist Neck:supple, no JVD, no bruits , no adenopathy Heart:S1S2 irreg irreg rapid without murmur, gallup, rub or click Lungs:clear without rales, rhonchi, or wheezes [id number], non tender, + BS, do not palpate liver spleen or masses Ext: lower ext edema B/L 1+, 2+ radial pulses Neuro:alert and oriented, MAE, follows commands, + facial  symmetry  EKG:A fib with HR 72 BPM  Septal infarct, inferior infarct  Lexiscan nuclear stress test: 12/01/2013 Impression Exercise Capacity: Lexiscan with no exercise. BP Response: Normal blood pressure response. Clinical Symptoms: There is dyspnea. ECG Impression: No significant ECG changes with Lexiscan. Comparison with Prior Nuclear Study: No images to compare  Overall Impression: Low risk stress nuclear study with what  appears to be a small area of fixed perfusion defect that may be  consistent with a prior apical-lateral infarction.. No evidence  of ischemia.  TTE: 02/23/2014 - Left ventricle: The cavity size was normal. Wall thickness was increased in a pattern of moderate LVH. Systolic function was  normal. The estimated ejection fraction was in the range of 55% to 60%. Wall motion was normal; there were no regional wall motion abnormalities. - Left atrium: The atrium was severely dilated. - Right atrium: The atrium was moderately dilated.    ASSESSMENT AND PLAN  1. Acute on chronic CHF - continue Lasix 80 mg daily, patient is instructed to take an extra Lasix in the afternoon if his weight goes up by 3 pounds overnight or 5 pounds in a week. Continue potassium 20 mEq daily   2. CORONARY, ARTERIOSCLEROSIS  2003 with stent to the circumflex stenosis extending into the second obtuse marginal branch with DES. Last nuclear stress test July 2015 negative for ischemia, asymptomatic, no workup needed at this time.  3.  Hypokalemia - on 20 mEq potassium daily is also advised to eat foods high in potassium.  4. Hypertension - controlled.  5. HLP - on simvastatin, tolerated well, all lipids at goal except for HDL that is low he is encouraged to start exercising.  Follow-up in 6 months with BMP prior to the appointment. Ena Dawley 03/05/2016

## 2016-03-06 ENCOUNTER — Ambulatory Visit (INDEPENDENT_AMBULATORY_CARE_PROVIDER_SITE_OTHER): Payer: PPO | Admitting: Gastroenterology

## 2016-03-06 ENCOUNTER — Encounter: Payer: Self-pay | Admitting: Gastroenterology

## 2016-03-06 ENCOUNTER — Telehealth: Payer: Self-pay | Admitting: Emergency Medicine

## 2016-03-06 VITALS — BP 130/82 | HR 70 | Ht 73.0 in | Wt 241.0 lb

## 2016-03-06 DIAGNOSIS — Z1211 Encounter for screening for malignant neoplasm of colon: Secondary | ICD-10-CM

## 2016-03-06 DIAGNOSIS — E119 Type 2 diabetes mellitus without complications: Secondary | ICD-10-CM

## 2016-03-06 DIAGNOSIS — Z794 Long term (current) use of insulin: Secondary | ICD-10-CM

## 2016-03-06 DIAGNOSIS — I4891 Unspecified atrial fibrillation: Secondary | ICD-10-CM | POA: Diagnosis not present

## 2016-03-06 DIAGNOSIS — Z7901 Long term (current) use of anticoagulants: Secondary | ICD-10-CM | POA: Insufficient documentation

## 2016-03-06 DIAGNOSIS — IMO0001 Reserved for inherently not codable concepts without codable children: Secondary | ICD-10-CM

## 2016-03-06 NOTE — Progress Notes (Signed)
Reviewed and agree with management plan.  Hanaan Gancarz T. Asani Deniston, MD FACG 

## 2016-03-06 NOTE — Progress Notes (Addendum)
03/06/2016 JOSHAUA EPPLE [id number] [birth year]   HISTORY OF PRESENT ILLNESS:  This is a 65 year old male who is previously known to Dr. Fuller Plan for colonoscopy in 2004 at which time he was found to have some polyps removed are both tubular adenomas and hyperplastic polyps.  He has not returned for follow-up colonoscopy since that time. He is here today to schedule this. He is on Eliquis for atrial fibrillation. He just saw Dr. Meda Coffee, his cardiologist, yesterday.  He denies any GI complaints.   Past Medical History:  Diagnosis Date  . Arthritis   . CHF (congestive heart failure) ([place])   . Depression   . Diabetes mellitus   . Edema of extremities   . Erectile dysfunction   . GERD (gastroesophageal reflux disease)   . Hyperlipidemia   . Hypertension   . Myocardial infarction 2000&2001  . Obstructive apnea   . Onychomycosis   . Tubular adenoma of colon 10/2002  . Unspecified sleep apnea 02/22/2013   Past Surgical History:  Procedure Laterality Date  . ANGIOPLASTY  2001   stent x 1  . COLONOSCOPY  2000?   negative  . TOE AMPUTATION Left 2012    reports that he has been smoking Cigarettes.  He started smoking about 48 years ago. He has a 11.25 pack-year smoking history. He has never used smokeless tobacco. He reports that he does not drink alcohol or use drugs. family history includes Heart attack in his father and mother. Allergies  Allergen Reactions  . Enalapril Maleate Cough    REACTION: cough      Outpatient Encounter Prescriptions as of 03/06/2016  Medication Sig  . amLODipine (NORVASC) 5 MG tablet Take 1 tablet (5 mg total) by mouth daily.  . carvedilol (COREG) 25 MG tablet TAKE 1 TABLET BY MOUTH TWICE DAILY WITH A MEAL  . ELIQUIS 5 MG TABS tablet TAKE 1 TABLET (5 MG TOTAL) BY MOUTH 2 (TWO) TIMES DAILY.  . furosemide (LASIX) 80 MG tablet Take 1 tablet (80 mg total) by mouth daily.  . insulin NPH-regular Human (NOVOLIN 70/30) (70-30) 100 UNIT/ML injection Inject  60units in morning, 20 units at night  . isosorbide mononitrate (IMDUR) 60 MG 24 hr tablet TAKE 1 TABLET BY MOUTH DAILY  . losartan (COZAAR) 100 MG tablet TAKE 1 TABLET (100 MG TOTAL) BY MOUTH DAILY.  . metFORMIN (GLUCOPHAGE) 1000 MG tablet TAKE 1 TABLET BY MOUTH TWICE DAILY WITH A MEAL  . Multiple Vitamin (MULTIVITAMIN) tablet Take 1 tablet by mouth daily.    . nicotine (NICODERM CQ - DOSED IN MG/24 HR) 7 mg/24hr patch Place 1 patch (7 mg total) onto the skin daily.  . nitroGLYCERIN (NITROSTAT) 0.4 MG SL tablet Place 0.4 mg under the tongue every 5 (five) minutes as needed. If no relief by 3rd tab, call 911   . Omega 3 1000 MG CAPS Take 3 capsules by mouth daily.  Marland Kitchen oxymorphone (OPANA) 5 MG tablet Take 5-10 mg by mouth every 8 (eight) hours. Per Dr Brien Few  . PARoxetine (PAXIL) 40 MG tablet Take 1 tablet (40 mg total) by mouth every morning.  . potassium chloride SA (K-DUR,KLOR-CON) 20 MEQ tablet Take 1 tablet (20 mEq total) by mouth daily.  . ranitidine (ZANTAC) 150 MG tablet Take 150 mg by mouth daily.  . simvastatin (ZOCOR) 20 MG tablet TAKE 1 TABLET (20 MG TOTAL) BY MOUTH AT BEDTIME.   No facility-administered encounter medications on file as of 03/06/2016.  REVIEW OF SYSTEMS  : All other systems reviewed and negative except where noted in the History of Present Illness.   PHYSICAL EXAM: BP 130/82   Pulse 70   Ht 6\' 1"  (1.854 m)   Wt 241 lb (109.3 kg)   BMI 31.80 kg/m  General: Well developed white male in no acute distress Head: Normocephalic and atraumatic Eyes:  Sclerae anicteric, conjunctiva pink. Ears: Normal auditory acuity Lungs: Clear throughout to auscultation Heart:  Irregularly irregular. Abdomen: Soft, non-distended.  Normal bowel sounds.  Non-tender. Rectal:  Will be done at the time of colonoscopy. Musculoskeletal: Symmetrical with no gross deformities  Skin: No lesions on visible extremities Extremities: No edema  Neurological: Alert oriented x 4, grossly  non-focal Psychological:  Alert and cooperative. Normal mood and affect  ASSESSMENT AND PLAN: -Screening colonoscopy:  Last colonoscopy 2004 with polyps. -Atrial fibrillation:  Will hold Eliquis 1-2 days prior to endoscopic procedures - will instruct when and how to resume after procedure. Benefits and risks of procedure explained including risks of bleeding, perforation, infection, missed lesions, reactions to medications and possible need for hospitalization and surgery for complications. Additional rare but real risk of stroke or other vascular clotting events off Eliquis also explained and need to seek urgent help if any signs of these problems occur. Will communicate by phone or EMR with patient's prescribing provider, Dr. Meda Coffee, to confirm that holding Eliquis is reasonable in this case.  -IDDM:  Insulin will be adjusted prior to endoscopic procedure per protocol. Will resume normal dosing after procedure.  CC:  Lorna Few, [place]

## 2016-03-06 NOTE — Telephone Encounter (Signed)
03/06/2016   RE: MARQUS MACPHEE DOB: 03-18-1951 MRN: [id number]    Dear Dr. Meda Coffee,    We have scheduled the above patient for an endoscopic procedure. Our records show that he is on anticoagulation therapy.   Please advise as to how long the patient may come off his therapy of Eliquis prior to the procedure, which is scheduled for 05-09-16.  Please fax back/ or route the completed form to Tinnie Gens, [place].   Sincerely,    Tinnie Gens, [place]

## 2016-03-06 NOTE — Patient Instructions (Signed)

## 2016-03-08 ENCOUNTER — Telehealth: Payer: Self-pay | Admitting: Cardiology

## 2016-03-08 NOTE — Telephone Encounter (Signed)
I spoke to patient. I advised him the form for approval is in Dr. Francesca Oman box.  Pt aware she is out of office today. I advised him I would forward this message to Dr. Meda Coffee and Karlene Einstein. He voiced understanding.

## 2016-03-08 NOTE — Telephone Encounter (Signed)
New Message:   He wants to know if [facility] had sent a fax to Dr Meda Coffee?

## 2016-03-11 ENCOUNTER — Telehealth: Payer: Self-pay | Admitting: Cardiology

## 2016-03-11 NOTE — Telephone Encounter (Signed)
Pt calling to see if Dr Meda Coffee signed his [place] smoking cessation program paperwork today.  Informed the pt that this was signed by Dr Meda Coffee, and fax back to the contact information provided on the form, this morning. Advised the pt to follow-up with our office, if they were unable to receive this fax.  Pt verbalized understanding and agrees with this plan.

## 2016-03-11 NOTE — Telephone Encounter (Signed)
New message  Pt call requesting to speak with RN to f/u on an approval fax form for pt. Pt did not disclose much information about call. Please call back to discuss

## 2016-03-11 NOTE — Telephone Encounter (Signed)
Dr Meda Coffee reviewed, signed this paperwork requested, and this was faxed back to [facility], at fax number provided.

## 2016-03-12 NOTE — Telephone Encounter (Signed)
Patient takes Eliquis for afib with CHADS2 score of 3 (HTN, HF, and DM) and no history of stroke. Ok to hold Eliquis for 2 days prior to endoscopy given renal function. Clearance routed to [employer], [place].

## 2016-03-12 NOTE — Telephone Encounter (Signed)
Anthony [name],  Would you be able to help them? Thank you! Anthony [name]

## 2016-03-25 NOTE — Telephone Encounter (Signed)
Left detailed message on machine.

## 2016-03-29 MED FILL — ELIQUIS 5 MG TABLET: 5 | 30 days supply | Qty: 60 | Fill #6

## 2016-04-02 ENCOUNTER — Encounter: Payer: Self-pay | Admitting: Podiatry

## 2016-04-02 ENCOUNTER — Ambulatory Visit (INDEPENDENT_AMBULATORY_CARE_PROVIDER_SITE_OTHER): Payer: PPO | Admitting: Podiatry

## 2016-04-02 VITALS — Ht 73.0 in | Wt 241.0 lb

## 2016-04-02 DIAGNOSIS — B351 Tinea unguium: Secondary | ICD-10-CM | POA: Diagnosis not present

## 2016-04-02 DIAGNOSIS — M79609 Pain in unspecified limb: Secondary | ICD-10-CM

## 2016-04-02 DIAGNOSIS — Q6650 Congenital pes planus, unspecified foot: Secondary | ICD-10-CM

## 2016-04-02 NOTE — Progress Notes (Signed)
Patient ID: Anthony [name], male   DOB: 11-Sep-1950, 65 y.o.   MRN: [id number] Complaint:  Visit Type: Patient returns to my office for continued preventative foot care services. Complaint: Patient states" my nails have grown long and thick and become painful to walk and wear shoes" Patient has been diagnosed with DM with neuropathy.. The patient presents for preventative foot care services. No changes to ROS  Podiatric Exam: Vascular: dorsalis pedis and posterior tibial pulses are palpable bilateral. Capillary return is immediate. Temperature gradient is WNL. Skin turgor WNL  Sensorium: Normal Semmes Weinstein monofilament test. Normal tactile sensation bilaterally. Nail Exam: Pt has thick disfigured discolored nails with subungual debris noted bilateral entire nail hallux through fifth toenails Ulcer Exam: There is no evidence of ulcer or pre-ulcerative changes or infection. Orthopedic Exam: Muscle tone and strength are WNL. No limitations in general ROM. No crepitus or effusions noted. HAV  B/L.  Pes planus. Skin: No Porokeratosis. No infection or ulcers  Diagnosis:  Onychomycosis, , Pain in right toe, pain in left toes  Treatment & Plan Procedures and Treatment: Consent by patient was obtained for treatment procedures. The patient understood the discussion of treatment and procedures well. All questions were answered thoroughly reviewed. Debridement of mycotic and hypertrophic toenails, 1 through 5 bilateral and clearing of subungual debris. No ulceration, no infection noted.  Return Visit-Office Procedure: Patient instructed to return to the office for a follow up visit 3 months for continued evaluation and treatment.

## 2016-04-03 DIAGNOSIS — E113391 Type 2 diabetes mellitus with moderate nonproliferative diabetic retinopathy without macular edema, right eye: Secondary | ICD-10-CM | POA: Diagnosis not present

## 2016-04-05 ENCOUNTER — Encounter: Payer: Self-pay | Admitting: Family Medicine

## 2016-04-05 ENCOUNTER — Ambulatory Visit (INDEPENDENT_AMBULATORY_CARE_PROVIDER_SITE_OTHER): Payer: PPO | Admitting: Family Medicine

## 2016-04-05 VITALS — BP 115/88 | HR 88 | Temp 98.1°F | Ht 73.0 in | Wt 244.2 lb

## 2016-04-05 DIAGNOSIS — Z794 Long term (current) use of insulin: Secondary | ICD-10-CM | POA: Diagnosis not present

## 2016-04-05 DIAGNOSIS — Z23 Encounter for immunization: Secondary | ICD-10-CM | POA: Diagnosis not present

## 2016-04-05 DIAGNOSIS — IMO0001 Reserved for inherently not codable concepts without codable children: Secondary | ICD-10-CM

## 2016-04-05 DIAGNOSIS — E119 Type 2 diabetes mellitus without complications: Secondary | ICD-10-CM

## 2016-04-05 LAB — POCT GLYCOSYLATED HEMOGLOBIN (HGB A1C): Hemoglobin A1C: 7

## 2016-04-05 NOTE — Patient Instructions (Signed)
Thank you so much for coming to visit today! Your A1C was 7 today. We will continue your Novolin 70/30 at 70 units once a day. Please try to work on drinking less sodas and soft drinks. Please let me know if your sugars starts going up. Please return in 3 months.  Dr. Gerlean Ren

## 2016-04-07 NOTE — Progress Notes (Signed)
Subjective:     Patient ID: Anthony [name], male   DOB: [birth year], 65 y.o.   MRN: [id number]  HPI Anthony [name] is a 65yo male presenting today for diabetes follow up. - Currently on Novolin 70/30, 70units in the morning. Was previously on 60units in the morning and 20 units in night, but he was going low at night, so he adjusted his insulin regimen. Changed regimen approximately one month ago. - Also taking Metformin '1000mg'$  - Seems to be having lower blood sugars after changing. Highest blood sugar in last week 122, lowest 70. - Reports diet is worse. Has been drinking a lot of soda and Pineapple Juice - Last A1C 6.5 in 12/2015  Review of Systems Per HPI. Otherwise negative.    Objective:   Physical Exam  Constitutional: He appears well-developed and well-nourished. No distress.  HENT:  Head: Normocephalic and atraumatic.  Cardiovascular: Normal rate and regular rhythm.   No murmur heard. Pulmonary/Chest: Effort normal. No respiratory distress. He has no wheezes.  Abdominal: Soft. He exhibits no distension. There is no tenderness.  Musculoskeletal: He exhibits no edema.  Skin: No rash noted.  Psychiatric: He has a normal mood and affect. His behavior is normal.      Assessment and Plan:     IDDM (insulin dependent diabetes mellitus) (HCC) - A1C 7 today, up from 6.5 at last visit in 12/2015 - Difficult to say how new regimen is doing since [name] changed it only one month ago. To continue Metformin and Novolin 70units per day. Will recheck A1C in 44month and adjust if necessary. - Discussed importance of diet. Will try to decrease amount of soft drinks and juice per day. Handout given on Glycemic Index. - Follow up in 3 months

## 2016-04-07 NOTE — Assessment & Plan Note (Signed)
-   A1C 7 today, up from 6.5 at last visit in 12/2015 - Difficult to say how new regimen is doing since Mr. Sperbeck changed it only one month ago. To continue Metformin and Novolin 70units per day. Will recheck A1C in 54months and adjust if necessary. - Discussed importance of diet. Will try to decrease amount of soft drinks and juice per day. Handout given on Glycemic Index. - Follow up in 3 months

## 2016-04-17 DIAGNOSIS — G4733 Obstructive sleep apnea (adult) (pediatric): Secondary | ICD-10-CM | POA: Diagnosis not present

## 2016-04-26 MED FILL — ELIQUIS 5 MG TABLET: 5 | 30 days supply | Qty: 60 | Fill #7

## 2016-05-06 ENCOUNTER — Encounter: Payer: Self-pay | Admitting: Family Medicine

## 2016-05-08 ENCOUNTER — Telehealth: Payer: Self-pay | Admitting: *Deleted

## 2016-05-08 NOTE — Telephone Encounter (Signed)
Left message to please call office. Pt. To advise whether or not he has held his Eliquis for 2 days prior To having colonoscopy 05/09/16.

## 2016-05-08 NOTE — Telephone Encounter (Signed)
Pt. Called office.  Stated his last dose of Eliquis was Monday, December 11th.  He is aware that he is to  Hold until after procedure.

## 2016-05-08 NOTE — Telephone Encounter (Signed)
Tried to reach pt. At work to inquire whether or not he has stoppe Eliquis prior to colonoscopy 05/09/16.  Unable To reach pt. At work or leave message.

## 2016-05-09 ENCOUNTER — Telehealth: Payer: Self-pay | Admitting: Gastroenterology

## 2016-05-09 ENCOUNTER — Encounter: Payer: PPO | Admitting: Gastroenterology

## 2016-05-09 NOTE — Telephone Encounter (Signed)
Please charge

## 2016-05-31 MED FILL — ELIQUIS 5 MG TABLET: 5 | 30 days supply | Qty: 60 | Fill #8

## 2016-06-06 DIAGNOSIS — I129 Hypertensive chronic kidney disease with stage 1 through stage 4 chronic kidney disease, or unspecified chronic kidney disease: Secondary | ICD-10-CM | POA: Diagnosis not present

## 2016-06-06 DIAGNOSIS — Z6833 Body mass index (BMI) 33.0-33.9, adult: Secondary | ICD-10-CM | POA: Diagnosis not present

## 2016-06-06 DIAGNOSIS — E785 Hyperlipidemia, unspecified: Secondary | ICD-10-CM | POA: Diagnosis not present

## 2016-06-06 DIAGNOSIS — I4891 Unspecified atrial fibrillation: Secondary | ICD-10-CM | POA: Diagnosis not present

## 2016-06-06 DIAGNOSIS — N183 Chronic kidney disease, stage 3 (moderate): Secondary | ICD-10-CM | POA: Diagnosis not present

## 2016-06-06 DIAGNOSIS — N2581 Secondary hyperparathyroidism of renal origin: Secondary | ICD-10-CM | POA: Diagnosis not present

## 2016-06-06 DIAGNOSIS — I2510 Atherosclerotic heart disease of native coronary artery without angina pectoris: Secondary | ICD-10-CM | POA: Diagnosis not present

## 2016-06-06 DIAGNOSIS — Z955 Presence of coronary angioplasty implant and graft: Secondary | ICD-10-CM | POA: Diagnosis not present

## 2016-06-06 DIAGNOSIS — E1129 Type 2 diabetes mellitus with other diabetic kidney complication: Secondary | ICD-10-CM | POA: Diagnosis not present

## 2016-06-06 DIAGNOSIS — Z9989 Dependence on other enabling machines and devices: Secondary | ICD-10-CM | POA: Diagnosis not present

## 2016-06-06 DIAGNOSIS — G4733 Obstructive sleep apnea (adult) (pediatric): Secondary | ICD-10-CM | POA: Diagnosis not present

## 2016-06-11 ENCOUNTER — Encounter: Payer: Self-pay | Admitting: Family Medicine

## 2016-06-11 ENCOUNTER — Ambulatory Visit (INDEPENDENT_AMBULATORY_CARE_PROVIDER_SITE_OTHER): Payer: PPO | Admitting: Family Medicine

## 2016-06-11 VITALS — BP 122/60 | HR 115 | Temp 98.5°F | Ht 73.0 in | Wt 246.0 lb

## 2016-06-11 DIAGNOSIS — S46811A Strain of other muscles, fascia and tendons at shoulder and upper arm level, right arm, initial encounter: Secondary | ICD-10-CM | POA: Diagnosis not present

## 2016-06-11 NOTE — Patient Instructions (Signed)
Thank you so much for coming to visit today! I suspect you strained your Trapezius muscle in the neck! You may try Tylenol or Salonpas patches to help provide relief. Please continue heat since it seams to be helping. If no improvement, let me know and we can try some injections. Please let me know if you develop numbness, tingling, or weakness.   Dr. Gerlean Ren

## 2016-06-13 NOTE — Progress Notes (Signed)
Subjective:     Patient ID: Anthony [name], male   DOB: 27-Oct-1950, 66 y.o.   MRN: [id number]  HPI  Anthony [name] is a 66yo male presenting today for right neck pain. Notes pain from right posterior head to right shoulder as well as mild posterior headache. Has been present for approximately two weeks. When he scheduled the appointment, he was unable to turn his head to the right. Seems to be improving slowly. He has been using a neck pillow that has heat and vibrations with some relief. Denies numbness, tingling, weakness.  Review of Systems Per HPI.    Objective:   Physical Exam  Constitutional: He appears well-developed and well-nourished. No distress.  Neck:  Tenderness along right trapezius.   Cardiovascular: Normal rate and regular rhythm.   No murmur heard. Pulmonary/Chest: Effort normal and breath sounds normal. No respiratory distress. He has no wheezes.  Musculoskeletal: He exhibits no edema.  Muscle strength 5/5 in upper and lower extremities.   Neurological:  Sensation intact over upper and lower extremities.  Psychiatric: He has a normal mood and affect. His behavior is normal.      Assessment and Plan:     1. Strain of right trapezius muscle, initial encounter OMM used with some relief of pain and improvement in ROM. Continue over the counter treatment with Tylenol (NSAIDs result in abdominal discomfort) and Salonpas patches. May continue heat as this seems to be helping as well. Return if no improvement or if numbness, weakness, tingling develop. Consider injections if no improvement.

## 2016-07-01 ENCOUNTER — Other Ambulatory Visit: Payer: Self-pay | Admitting: *Deleted

## 2016-07-01 MED ORDER — CARVEDILOL 25 MG PO TABS: ORAL_TABLET | ORAL | 3 refills | Status: DC

## 2016-07-01 MED FILL — ELIQUIS 5 MG TABLET: 5 | 30 days supply | Qty: 60 | Fill #9

## 2016-07-02 ENCOUNTER — Ambulatory Visit: Payer: PPO | Admitting: Podiatry

## 2016-07-09 ENCOUNTER — Encounter: Payer: Self-pay | Admitting: Podiatry

## 2016-07-09 ENCOUNTER — Ambulatory Visit (INDEPENDENT_AMBULATORY_CARE_PROVIDER_SITE_OTHER): Payer: PPO | Admitting: Podiatry

## 2016-07-09 VITALS — Ht 73.0 in | Wt 246.0 lb

## 2016-07-09 DIAGNOSIS — B351 Tinea unguium: Secondary | ICD-10-CM

## 2016-07-09 DIAGNOSIS — E119 Type 2 diabetes mellitus without complications: Secondary | ICD-10-CM

## 2016-07-09 DIAGNOSIS — M79609 Pain in unspecified limb: Secondary | ICD-10-CM

## 2016-07-09 NOTE — Progress Notes (Signed)
Patient ID: Anthony [name], male   DOB: 1951-05-07, 66 y.o.   MRN: [id number] Complaint:  Visit Type: Patient returns to my office for continued preventative foot care services. Complaint: Patient states" my nails have grown long and thick and become painful to walk and wear shoes" Patient has been diagnosed with DM with neuropathy.. The patient presents for preventative foot care services. No changes to ROS  Podiatric Exam: Vascular: dorsalis pedis and posterior tibial pulses are palpable bilateral. Capillary return is immediate. Temperature gradient is WNL. Skin turgor WNL  Sensorium: Normal Semmes Weinstein monofilament test. Normal tactile sensation bilaterally. Nail Exam: Pt has thick disfigured discolored nails with subungual debris noted bilateral entire nail hallux through fifth toenails Ulcer Exam: There is no evidence of ulcer or pre-ulcerative changes or infection. Orthopedic Exam: Muscle tone and strength are WNL. No limitations in general ROM. No crepitus or effusions noted. HAV  B/L.  Pes planus. Skin: No Porokeratosis. No infection or ulcers  Diagnosis:  Onychomycosis, , Pain in right toe, pain in left toes  Treatment & Plan Procedures and Treatment: Consent by patient was obtained for treatment procedures. The patient understood the discussion of treatment and procedures well. All questions were answered thoroughly reviewed. Debridement of mycotic and hypertrophic toenails, 1 through 5 bilateral and clearing of subungual debris. No ulceration, no infection noted.  Return Visit-Office Procedure: Patient instructed to return to the office for a follow up visit 3 months for continued evaluation and treatment.   Gardiner Barefoot DPM

## 2016-07-15 DIAGNOSIS — N2581 Secondary hyperparathyroidism of renal origin: Secondary | ICD-10-CM | POA: Diagnosis not present

## 2016-07-22 ENCOUNTER — Other Ambulatory Visit: Payer: Self-pay | Admitting: Family Medicine

## 2016-07-31 MED FILL — ELIQUIS 5 MG TABLET: 5 | 30 days supply | Qty: 60 | Fill #10

## 2016-08-09 ENCOUNTER — Other Ambulatory Visit: Payer: Self-pay | Admitting: Cardiology

## 2016-08-09 DIAGNOSIS — R600 Localized edema: Secondary | ICD-10-CM

## 2016-08-26 ENCOUNTER — Encounter: Payer: Self-pay | Admitting: Family Medicine

## 2016-08-26 ENCOUNTER — Ambulatory Visit (INDEPENDENT_AMBULATORY_CARE_PROVIDER_SITE_OTHER): Payer: PPO | Admitting: Family Medicine

## 2016-08-26 VITALS — BP 130/82 | HR 56 | Temp 98.6°F | Ht 73.0 in | Wt 244.2 lb

## 2016-08-26 DIAGNOSIS — M2142 Flat foot [pes planus] (acquired), left foot: Secondary | ICD-10-CM | POA: Diagnosis not present

## 2016-08-26 DIAGNOSIS — G629 Polyneuropathy, unspecified: Secondary | ICD-10-CM

## 2016-08-26 DIAGNOSIS — M2141 Flat foot [pes planus] (acquired), right foot: Secondary | ICD-10-CM

## 2016-08-26 DIAGNOSIS — R202 Paresthesia of skin: Secondary | ICD-10-CM

## 2016-08-26 MED ORDER — GABAPENTIN 300 MG PO CAPS
300.0000 mg | ORAL_CAPSULE | Freq: Every day | ORAL | 0 refills | Status: DC
Start: 2016-08-26 — End: 2016-11-21

## 2016-08-26 NOTE — Patient Instructions (Signed)
Please schedule an appointment with the [facility] [phone] We will initiate Gabapentin once daily. We can increase this if needed. It may make you drowsy, so we will start off taking it at night.  Dr. Gerlean Ren

## 2016-08-27 LAB — VITAMIN B12: Vitamin B-12: 605 pg/mL (ref 232–1245)

## 2016-08-27 NOTE — Progress Notes (Signed)
Subjective:     Patient ID: Anthony [name], male   DOB: Jan 27, 1951, 66 y.o.   MRN: [id number]  HPI Anthony [name] is a 66yo male presenting today to abnormal sensation in left leg. Notes several week history of abnormal sensation in left leg. Describes sensation as a "draft, cool" feeling, sometimes like water is dripping down his leg. Comes and goes, not currently present. When it does occur lasts for several hours if not all day. Follows with neurosurgery with next appointment 08/28/16. Notes constipation, but denies urinary retention, urinary/fecal incontinence. History of peripheral neuropathy secondary to diabetes noted. Also notes history of flat feet, which causes him discomfort. Has tried orthotics in the past, but they were hard and hurt his feet--would be interested in more orthotics if they could be soft. Xrays in 8/17 with arthritis. Follows with podiatry.   Review of Systems Per HPI    Objective:   Physical Exam  Constitutional: He appears well-developed and well-nourished. No distress.  Musculoskeletal:  Bilateral pes planus. Muscle strength 5/5 in upper and lower extremities.  Neurological:  Sensation intact over feet and lower extremities bilaterally.   Psychiatric: He has a normal mood and affect. His behavior is normal.      Assessment and Plan:     1. Paresthesia History of diabetic nephropathy noted. Will initiate Gabapentin 300mg  at night , may titrate up as needed. Creatinine clearance 62. Differential also includes spinal etiologies (follows up with Neurosurgery on 4/4) and vitamin B12 deficiency (increased risk with Metformin). Will check vitamin B12 today.   2. Pes Planus Recommend following up with [facility] for orthotics vs. Inserts. To bring shoes most commonly worn to this visit.

## 2016-08-29 ENCOUNTER — Telehealth: Payer: Self-pay | Admitting: Family Medicine

## 2016-08-29 NOTE — Telephone Encounter (Signed)
Pt called and would like to have his lab results. jw

## 2016-09-04 ENCOUNTER — Encounter: Payer: Self-pay | Admitting: Family Medicine

## 2016-09-04 ENCOUNTER — Ambulatory Visit (INDEPENDENT_AMBULATORY_CARE_PROVIDER_SITE_OTHER): Payer: PPO | Admitting: Family Medicine

## 2016-09-04 DIAGNOSIS — M79672 Pain in left foot: Secondary | ICD-10-CM

## 2016-09-04 NOTE — Telephone Encounter (Signed)
3rd request. ep °

## 2016-09-04 NOTE — Telephone Encounter (Signed)
Pt is calling back to see when someone was going to call with his lab results. jw

## 2016-09-05 NOTE — Telephone Encounter (Signed)
Pt informed. Blythe Veach T Sabrina Keough, CMA  

## 2016-09-05 NOTE — Telephone Encounter (Signed)
His Vitamin B12 was normal.

## 2016-09-06 NOTE — Progress Notes (Signed)
  Anthony [name] - 66 y.o. male MRN [id number]  Date of birth: 05-02-1951  SUBJECTIVE:  Including CC & ROS.   Anthony [name] is a 66 year old male that is presenting with left midfoot pain. He denies any inciting event. He was seen by his primary care physician and referred here. He has had a several week history of abnormal sensation in his feet. He does have a history of peripheral neuropathy. But he feels pain on the medial aspect of his left midfoot. He is not taking any medications currently. He denies any swelling. Denies any injury.   ROS: No unexpected weight loss, fever, chills, swelling, instability, muscle pain,redness, otherwise see HPI    HISTORY: Past Medical, Surgical, Social, and Family History Reviewed & Updated per EMR.   Pertinent Historical Findings include: PMSHx -  diabetes, A. fib, obesity, hypertension, CAD, toe amputation, angioplasty PSHx -  former smoker, no alcohol use FHx -  CAD  DATA REVIEWED: 12/26/15 Left foot x-ray: osteoarthritis. Amputation of second phalange.  06/09/14 left foot x-rays: moderate to severe pes planus with dorsal lipping resulting from osteoarthritic changes from the mid tarsal joint to the distal most joints of Lisfranc. Pes planus with moderate edema amputation second digit left foot at the metatarsal phalangeal joint. 06/09/14 right foot x-rays: postsurgical foot exostectomy and arthroplasty hallux and second digit of the right foot. Moderate hallux abductus with a minimal increase in the first intermetatarsal angle but he does demonstrate hypertrophic medial condyle to the head of the first metatarsal. Mild tailor's bunion deformity is also noted with a cyst on the plantar condyle. Also pes planus with dorsal spurring resulting in some osteoarthritic changes of the Russell aspect of Lisfranc's joints.  PHYSICAL EXAM:  VS: BP:(!) 128/98  HR:65bpm  TEMP: ( )  RESP:   HT:6' 1"$  (185.4 cm)   WT:245 lb (111.1 kg)  BMI:32.4 PHYSICAL EXAM: Gen: NAD,  alert, cooperative with exam,  HEENT: clear conjunctiva, EOMI CV:  no edema, capillary refill brisk,  Resp: non-labored, normal speech Skin: no rashes, normal turgor  Neuro: no gross deficits.  Psych:  alert and oriented MSK: Feet: Significant loss of his midfoot significant pes planus bilaterally. Absent second digit on his left foot. Limited range of motion in his ankle with dorsiflexion and plantarflexion. Unable to arise on his tiptoes. No significant areas of tenderness. No tenderness over the Achilles No tender to palpation over the plantar fascia Neurovascularly intact  ASSESSMENT & PLAN:   Left foot pain He has significant osteoarthritis throughout his foot with pes planus. - He was fitted with green spore insoles size 18 and a large scaphoid pad placed on either insole - He'll try this for some time and can make custom orthotics in the future.

## 2016-09-06 NOTE — Assessment & Plan Note (Signed)
He has significant osteoarthritis throughout his foot with pes planus. - He was fitted with green spore insoles size 18 and a large scaphoid pad placed on either insole - He'll try this for some time and can make custom orthotics in the future.

## 2016-09-08 ENCOUNTER — Other Ambulatory Visit: Payer: Self-pay | Admitting: Family Medicine

## 2016-09-11 MED FILL — ELIQUIS 5 MG TABLET: 5 | 30 days supply | Qty: 60 | Fill #11

## 2016-09-23 ENCOUNTER — Other Ambulatory Visit: Payer: Self-pay | Admitting: Family Medicine

## 2016-09-23 DIAGNOSIS — R600 Localized edema: Secondary | ICD-10-CM

## 2016-10-01 DIAGNOSIS — E113393 Type 2 diabetes mellitus with moderate nonproliferative diabetic retinopathy without macular edema, bilateral: Secondary | ICD-10-CM | POA: Diagnosis not present

## 2016-10-01 LAB — HM DIABETES EYE EXAM

## 2016-10-04 ENCOUNTER — Encounter: Payer: Self-pay | Admitting: Family Medicine

## 2016-10-08 ENCOUNTER — Ambulatory Visit: Payer: PPO | Admitting: Podiatry

## 2016-10-20 ENCOUNTER — Other Ambulatory Visit: Payer: Self-pay | Admitting: Family Medicine

## 2016-10-28 ENCOUNTER — Other Ambulatory Visit: Payer: Self-pay | Admitting: *Deleted

## 2016-10-28 MED ORDER — APIXABAN 5 MG PO TABS: 5.0000 mg | ORAL_TABLET | Freq: Two times a day (BID) | ORAL | 1 refills | Status: DC

## 2016-10-30 ENCOUNTER — Ambulatory Visit: Payer: PPO | Admitting: Neurology

## 2016-10-31 ENCOUNTER — Other Ambulatory Visit: Payer: Self-pay | Admitting: Pharmacist

## 2016-10-31 MED ORDER — APIXABAN 5 MG PO TABS: 5.0000 mg | ORAL_TABLET | Freq: Two times a day (BID) | ORAL | 2 refills | Status: DC

## 2016-11-07 ENCOUNTER — Ambulatory Visit (INDEPENDENT_AMBULATORY_CARE_PROVIDER_SITE_OTHER): Payer: PPO | Admitting: Family Medicine

## 2016-11-07 ENCOUNTER — Encounter: Payer: Self-pay | Admitting: Family Medicine

## 2016-11-07 VITALS — BP 132/88 | HR 62 | Temp 98.3°F | Ht 73.0 in | Wt 250.4 lb

## 2016-11-07 DIAGNOSIS — Z794 Long term (current) use of insulin: Secondary | ICD-10-CM | POA: Diagnosis not present

## 2016-11-07 DIAGNOSIS — IMO0001 Reserved for inherently not codable concepts without codable children: Secondary | ICD-10-CM

## 2016-11-07 DIAGNOSIS — R600 Localized edema: Secondary | ICD-10-CM

## 2016-11-07 DIAGNOSIS — E119 Type 2 diabetes mellitus without complications: Secondary | ICD-10-CM | POA: Diagnosis not present

## 2016-11-07 LAB — POCT GLYCOSYLATED HEMOGLOBIN (HGB A1C): Hemoglobin A1C: 6.5

## 2016-11-07 MED ORDER — FUROSEMIDE 40 MG PO TABS: 40.0000 mg | ORAL_TABLET | Freq: Every day | ORAL | 0 refills | Status: DC

## 2016-11-07 NOTE — Progress Notes (Signed)
p 

## 2016-11-07 NOTE — Patient Instructions (Addendum)
Thank you for coming in today! Please take 120mg  of Lasix each morning (one 80mg  tablet and one 40mg  tablet) for 3 days. Follow up with your Cardiologist.  We will recheck your A1C today. Please return for a diabetes visit.  Dr. Gerlean Ren

## 2016-11-08 ENCOUNTER — Telehealth: Payer: Self-pay | Admitting: Family Medicine

## 2016-11-08 NOTE — Telephone Encounter (Signed)
Pt called because he was seen yesterday by his doctor and they changed the dosage of lasix to 1 dose and then a 1/2 dose each day. He wanted the doctor to call over let his heart know and make sure that she is okay with that. Dr. Ena Dawley [phone]. jw

## 2016-11-10 NOTE — Progress Notes (Signed)
Subjective:     Patient ID: Anthony [name], male   DOB: [birth year], 66 y.o.   MRN: [id number]  HPI Mr. Senne is a 66yo male presenting today for leg swelling. History of prior lower extremity edema. Edema previously improved overnight, but now states his edema is not improved in the morning. Currently on Lasix 80mg . Reports this dose does not make him urinate and that his weight has slowly been creeping up. Follows with Cardiology. Denies shortness of breath. Denies orthopnea or paroxysmal nocturnal dyspnea. Denies chest pain or palpitations. Former smoker.  Review of Systems Per HPI    Objective:   Physical Exam  Constitutional: He appears well-developed and well-nourished. No distress.  Cardiovascular: Normal rate and regular rhythm.   No murmur heard. Pulmonary/Chest: Effort normal. No respiratory distress. He has no wheezes.  Musculoskeletal:  2+ pitting edema to mid-calf.  Psychiatric: He has a normal mood and affect. His behavior is normal.       Assessment and Plan:     1. Lower Extremity Edema Consistent with prior. Suspect current lasix dose is no longer adequate. Dry weight ~245pounds, todays weight 250pounds. Will give three tablets of 40mg  lasix to take with current 80mg  lasix, to take 120mg  lasix x3 days (patient uncomfortable with doubling dose for 3 days). Recommend elevation of legs when able. Recommend compression hose. Follow up with Cardiology for management of lasix dosing.

## 2016-11-11 ENCOUNTER — Telehealth: Payer: Self-pay | Admitting: Cardiology

## 2016-11-11 NOTE — Telephone Encounter (Signed)
New message    Pt is calling asking for a call back from RN about his medication. He said that he wants to increase to 1 and a half pills to help the swelling per his PCP.   Pt c/o swelling: STAT is pt has developed SOB within 24 hours  1. How long have you been experiencing swelling? A week  2. Where is the swelling located? Legs   3.  Are you currently taking a "fluid pill"? yes  4.  Are you currently SOB? No   5.  Have you traveled recently? No

## 2016-11-11 NOTE — Telephone Encounter (Signed)
Pt calling to state he saw his PCP last week, had LE edema, was advised to increase lasix from 80 mg daily to 80/40 for 3 days. Pt states PCP asked him to call Dr Meda Coffee today and  to ask in okay to continue on lasix 80/40 dose, pt states LE edema has improved, denies shortness of breath.  Pt was due to see Dr Meda Coffee April 2018, pt advised I have scheduled him to see Mare Loan, PA tomorrow for further evaluation and recommendations about lasix dosing.

## 2016-11-11 NOTE — Telephone Encounter (Signed)
LMTCB

## 2016-11-12 ENCOUNTER — Ambulatory Visit (INDEPENDENT_AMBULATORY_CARE_PROVIDER_SITE_OTHER): Payer: PPO | Admitting: Cardiology

## 2016-11-12 ENCOUNTER — Encounter: Payer: Self-pay | Admitting: Cardiology

## 2016-11-12 VITALS — BP 124/68 | HR 124 | Ht 73.0 in | Wt 244.0 lb

## 2016-11-12 DIAGNOSIS — M7989 Other specified soft tissue disorders: Secondary | ICD-10-CM

## 2016-11-12 DIAGNOSIS — I4891 Unspecified atrial fibrillation: Secondary | ICD-10-CM | POA: Diagnosis not present

## 2016-11-12 DIAGNOSIS — I5033 Acute on chronic diastolic (congestive) heart failure: Secondary | ICD-10-CM | POA: Diagnosis not present

## 2016-11-12 MED ORDER — ROSUVASTATIN CALCIUM 5 MG PO TABS: 5.0000 mg | ORAL_TABLET | Freq: Every day | ORAL | 11 refills | Status: DC

## 2016-11-12 MED ORDER — DILTIAZEM HCL ER COATED BEADS 120 MG PO CP24: 120.0000 mg | ORAL_CAPSULE | Freq: Every day | ORAL | 2 refills | Status: DC

## 2016-11-12 NOTE — Patient Instructions (Addendum)
Medication Instructions:  1) START Cardizem 120mg  once daily 2) DISCONTINUE Simvastatin 3) START Rosuvastatin 5mg  once daily  Labwork: BMET, Pro BNP today  Testing/Procedures: Your physician has requested that you have an echocardiogram in a week. Echocardiography is a painless test that uses sound waves to create images of your heart. It provides your doctor with information about the size and shape of your heart and how well your heart's chambers and valves are working. This procedure takes approximately one hour. There are no restrictions for this procedure.    Follow-Up: Your physician recommends that you schedule a follow-up appointment in: 2 weeks with Dr. Meda Coffee or Lyda Jester, PA-C.   Any Other Special Instructions Will Be Listed Below (If Applicable).     If you need a refill on your cardiac medications before your next appointment, please call your pharmacy.

## 2016-11-12 NOTE — Progress Notes (Signed)
11/12/2016 Anthony [name]   11-Jun-1950  [id number]  Primary Physician Lorna Few, DO Primary Cardiologist: Dr. Meda Coffee   Reason for Visit/CC: F/u for bilateral LEE, acute diastolic HF and atrial fibrillation    HPI:  Anthony [name] is a 66 y.o. male followed by Dr. Meda Coffee with h/o CAD s/p stenting of the circumflex lesion extending into the second obtuse marginal branch with a drug-eluting stent in 2003, also h/o chronic atrial fibrillation w/ CHA2DS2 VASc score of 4, on chronic a/c therapy with Eliquis, HTN, DM, OSA, and HLD. His last 2D echo in 2015 showed normal LVF with a EF of 55-60%, normal wall motion, severely dilated LA and moderately dilated RA. No significant valvular disease noted at that time.   He presents to clinic today for f/u given recent issues with bilateral LEE. He takes Lasix daily, 80 mg. He first consulted his PCP who advised that he increase his lasix dose to 80 mg in the morning and 40 mg in the evening x 3 days and to f/u in our office for further evaluation.   EKG today shows atrial fibrillation with a RVR in the 140s. BP is stable at 124/68. He is asymptomatic. He denies palpitations, dyspnea, exertional dyspnea, chest pain, dizziness, syncope/ near syncope. He reports improvement with BID dosing, however he has completed the recommended 3 days course and back to once a day dosing. He still has 1+ bilateral pitting edema on exam, however he notes reduction in weight from 250 lb at PCP office to 244 lb today. He is unsure of his baseline weight. He denies orthopnea/PND. He admits that he is not always 100% compliant with CPAP, but sleeps with it most nights. He reports full med compliance at home. He admits that he has been snacking on high sodium foods, such as potato chips and peanut butter crackers. He avoids canned foods and eating out.     No outpatient prescriptions have been marked as taking for the 11/12/16 encounter (Office Visit) with Consuelo Pandy, PA-C.   Allergies  Allergen Reactions  . Enalapril Maleate Cough    REACTION: cough   Past Medical History:  Diagnosis Date  . Arthritis   . CHF (congestive heart failure) ([place])   . Depression   . Diabetes mellitus   . Edema of extremities   . Erectile dysfunction   . GERD (gastroesophageal reflux disease)   . Hyperlipidemia   . Hypertension   . Myocardial infarction ([place]) 2000&2001  . Obstructive apnea   . Onychomycosis   . Tubular adenoma of colon 10/2002  . Unspecified sleep apnea 02/22/2013   Family History  Problem Relation Age of Onset  . Heart attack Mother   . Heart attack Father   . Colon cancer Neg Hx   . Stomach cancer Neg Hx    Past Surgical History:  Procedure Laterality Date  . ANGIOPLASTY  2001   stent x 1  . COLONOSCOPY  2000?   negative  . TOE AMPUTATION Left 2012   Social History   Social History  . Marital status: Married    Spouse name: [name]  . Number of children: 2  . 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 Smoking status: Former Smoker    Packs/day: 0.25    Years: 45.00    Types: Cigarettes    Start date: 05/28/1967    Quit  date: 06/11/2016  . Smokeless tobacco: Never Used  . Alcohol use No     Comment: quit in 1993  . Drug use: No  . Sexual activity: Not on file   Other Topics Concern  . Not on file   Social History Narrative   Patient is right handed, consumes caffeine rarely. Patient resides in home with wife.     Review of Systems: General: negative for chills, fever, night sweats or weight changes.  Cardiovascular: negative for chest pain, dyspnea on exertion, edema, orthopnea, palpitations, paroxysmal nocturnal dyspnea or shortness of breath Dermatological: negative for rash Respiratory: negative for cough or wheezing Urologic: negative for hematuria Abdominal: negative for nausea, vomiting, diarrhea, bright red blood per rectum, melena,  or hematemesis Neurologic: negative for visual changes, syncope, or dizziness All other systems reviewed and are otherwise negative except as noted above.   Physical Exam:  Height 6\' 1"  (1.854 m).  General appearance: alert, cooperative, no distress and moderately obese Neck: no carotid bruit and no JVD Lungs: clear to auscultation bilaterally Heart: irregularly irregular rhythm and tachy rate. no murmurs Extremities: 1+ LEE bilaterally  Pulses: 2+ and symmetric Skin: Skin color, texture, turgor normal. No rashes or lesions Neurologic: Grossly normal  EKG atrial fibrillation w/ RVR 142 bpm -- personally reviewed   ASSESSMENT AND PLAN:   1. Bilateral LEE: 1+ bilateral LEE noted on exam. Per pt, he has had improvement after temporary increase in Lasix to BID dosing. Now back to once a day dosing. No dyspnea, orthopnea or PND. Lung exam is clear. He is in rapid afib, which is likely contributing. Also on amlodipine which can also contribute. We will obtain a BNP today. If elevated and renal function is normal, will advised that he further increase his Lasix to BID for further diuresis. We will also check a repeat echo to ensure no change in LVF. We discussed importance of low sodium diet. Based on BNP and BMP results, we will call the patient to advise regarding lasix adjustments.   2. Chronic Atrial Fibrillation w/ RVR: V-rate in the 140s. BP is stable and he is asymptomatic. He needs better rate control and diuresis. He is maxed out on Coreg. We will add Cardizem 120 mg for additional rate control. We will obtain a repeat echo in 1 week, once rate is improved, to assess LVF. If systolic dysfunction, then we may need to d/c Cardizem and add digoxin or amiodarone for rate control. His last echo in 2015 showed a severely dilated LA + he has chronic afib (last several EKGs dating back to 2016 showed afib),  thus I don't think he would do well with rhythm control stragey. Continue rate control.  Return back in 2 weeks for repeat EKG. Check BMP today to check renal function and K.   3. HTN: stable. Adding Cardizem for additional rate control. F/u in 2 weeks for repeat assessment.   4. HLD: has been on simvastatin as an outpatient, however since we are adding Cardizem, we will discontinue simvastatin and start Crestor 5 mg. He will need f/u Lipid panel and HFTs in 6-8 weeks.    Follow-Up in 2 weeks for repeat assessment of afib, BP and LEE as well as review 2D echo results.   Jeryl Wilbourn Ladoris Gene, MHS [facility] 11/12/2016 10:29 AM

## 2016-11-13 ENCOUNTER — Telehealth: Payer: Self-pay | Admitting: Cardiology

## 2016-11-13 ENCOUNTER — Other Ambulatory Visit: Payer: Self-pay | Admitting: *Deleted

## 2016-11-13 LAB — BASIC METABOLIC PANEL
BUN/Creatinine Ratio: 8 — ABNORMAL LOW (ref 10–24)
BUN: 12 mg/dL (ref 8–27)
CO2: 24 mmol/L (ref 20–29)
Calcium: 8.6 mg/dL (ref 8.6–10.2)
Chloride: 103 mmol/L (ref 96–106)
Creatinine, Ser: 1.59 mg/dL — ABNORMAL HIGH (ref 0.76–1.27)
GFR calc Af Amer: 52 mL/min/{1.73_m2} — ABNORMAL LOW (ref >59)
GFR calc non Af Amer: 45 mL/min/{1.73_m2} — ABNORMAL LOW (ref >59)
Glucose: 186 mg/dL — ABNORMAL HIGH (ref 65–99)
Potassium: 3.7 mmol/L (ref 3.5–5.2)
Sodium: 145 mmol/L — ABNORMAL HIGH (ref 134–144)

## 2016-11-13 LAB — PRO B NATRIURETIC PEPTIDE: NT-Pro BNP: 1651 pg/mL — ABNORMAL HIGH (ref 0–376)

## 2016-11-13 MED ORDER — APIXABAN 5 MG PO TABS: 5.0000 mg | ORAL_TABLET | Freq: Two times a day (BID) | ORAL | 3 refills | Status: DC

## 2016-11-13 NOTE — Telephone Encounter (Signed)
°*  STAT* If patient is at the pharmacy, call can be transferred to refill team.   1. Which medications need to be refilled? (please list name of each medication and dose if known) Eliquis  2. Which pharmacy/location (including street and city if local pharmacy) is medication to be sent to?[facility] [phone]  3. Do they need a 30 day or 90 day supply? 60 and refills

## 2016-11-14 ENCOUNTER — Telehealth: Payer: Self-pay | Admitting: Cardiology

## 2016-11-14 DIAGNOSIS — Z79899 Other long term (current) drug therapy: Secondary | ICD-10-CM

## 2016-11-14 MED ORDER — FUROSEMIDE 80 MG PO TABS: 80.0000 mg | ORAL_TABLET | Freq: Two times a day (BID) | ORAL | 3 refills | Status: DC

## 2016-11-14 MED ORDER — POTASSIUM CHLORIDE CRYS ER 20 MEQ PO TBCR: 20.0000 meq | EXTENDED_RELEASE_TABLET | Freq: Two times a day (BID) | ORAL | 3 refills | Status: DC

## 2016-11-14 NOTE — Telephone Encounter (Signed)
Follow up    Pt is returning call to [facility] , missed her call several time please call

## 2016-11-14 NOTE — Telephone Encounter (Signed)
Follow Up: ° ° °Returning your call,concerning his lab results. °

## 2016-11-14 NOTE — Telephone Encounter (Signed)
-----   Message from Consuelo Pandy, [place] sent at 11/13/2016  4:14 PM EDT ----- BNP is abnormal. His edema is due to acute CHF. Renal function and K are stable. He needs to increase his Lasix back up to 80 mg BID. Increase K-dur to 20 mEq BID. Repeat labs in 1 week. Recheck a BNP and BMP. He needs to continue with plans to get an echo to check pump function of heart. Avoid salt. Make sure he has a f/u appt scheduled in 1-2 weeks with APP or primary cardiologist.

## 2016-11-18 ENCOUNTER — Other Ambulatory Visit: Payer: Self-pay

## 2016-11-21 ENCOUNTER — Other Ambulatory Visit: Payer: Self-pay | Admitting: Family Medicine

## 2016-11-21 ENCOUNTER — Other Ambulatory Visit: Payer: Self-pay

## 2016-11-21 ENCOUNTER — Ambulatory Visit (HOSPITAL_COMMUNITY): Payer: PPO | Attending: Cardiology

## 2016-11-21 ENCOUNTER — Other Ambulatory Visit: Payer: PPO | Admitting: *Deleted

## 2016-11-21 DIAGNOSIS — Z79899 Other long term (current) drug therapy: Secondary | ICD-10-CM | POA: Diagnosis not present

## 2016-11-21 DIAGNOSIS — M7989 Other specified soft tissue disorders: Secondary | ICD-10-CM | POA: Insufficient documentation

## 2016-11-21 DIAGNOSIS — I081 Rheumatic disorders of both mitral and tricuspid valves: Secondary | ICD-10-CM | POA: Insufficient documentation

## 2016-11-21 DIAGNOSIS — I4891 Unspecified atrial fibrillation: Secondary | ICD-10-CM | POA: Diagnosis not present

## 2016-11-22 ENCOUNTER — Telehealth: Payer: Self-pay

## 2016-11-22 LAB — BASIC METABOLIC PANEL
BUN/Creatinine Ratio: 7 — ABNORMAL LOW (ref 10–24)
BUN: 11 mg/dL (ref 8–27)
CO2: 22 mmol/L (ref 20–29)
Calcium: 8.5 mg/dL — ABNORMAL LOW (ref 8.6–10.2)
Chloride: 100 mmol/L (ref 96–106)
Creatinine, Ser: 1.6 mg/dL — ABNORMAL HIGH (ref 0.76–1.27)
GFR calc Af Amer: 51 mL/min/{1.73_m2} — ABNORMAL LOW (ref >59)
GFR calc non Af Amer: 45 mL/min/{1.73_m2} — ABNORMAL LOW (ref >59)
Glucose: 245 mg/dL — ABNORMAL HIGH (ref 65–99)
Potassium: 3.7 mmol/L (ref 3.5–5.2)
Sodium: 139 mmol/L (ref 134–144)

## 2016-11-22 LAB — PRO B NATRIURETIC PEPTIDE: NT-Pro BNP: 930 pg/mL — ABNORMAL HIGH (ref 0–376)

## 2016-11-22 NOTE — Telephone Encounter (Signed)
**Note De-Identified Neco Kling Obfuscation** Received a PA for Furosemide form [facility] fax from [place]. I have completed form, had Dr Meda Coffee sign it and I faxed it back to [place]. Awaiting response.

## 2016-11-25 ENCOUNTER — Encounter: Payer: Self-pay | Admitting: Physician Assistant

## 2016-11-25 NOTE — Progress Notes (Addendum)
Cardiology Office Note    Date:  11/26/2016  ID:  [name], Anthony 01-13-1951, MRN [id number] PCP:  Sela Hilding, MD  Cardiologist: Dr. Meda Coffee   Chief Complaint: f/u CHF  History of Present Illness:  Anthony [name] is a 66 y.o. male with history of CAD s/p stenting of the circumflex lesion extending into the second obtuse marginal branch with a drug-eluting stent in 2003, chronic atrial fibrillation, CKD III by labs, HTN, DM, obesity, OSA, HLD, prior chronic diastolic CHF who presents for f/u of swelling and abnormal echo.  To recap hx, notes indicate he was found to be in atrial fib when he went into have cataracts doneJune 2015 but the patient reported he had been in atrial fib at hospitalization back in 2001 as well. EKG from 2014 showed NSR. He was initially placed on Eliquis and amiodarone as well as digoxin but the patient discontinued his amiodarone and digoxin due to side effects per Dr. Francesca Oman note. Decision was made not to pursue rhythm control, as he felt better once rate was controlled. He has had several office visits for what appears to be rapid ventricular rates in the 110-120 range associated with acute on chronic HF. Last nuc 11/2013 was low risk with what appeared to be a small area of fixed perfusion defect that may be consistent with a prior apical-lateral infarction, no ischemia. Prior echo 01/2014 showed moderate LVH, EF 55-60%, no RWMA, severely dilated LA, moderately dilated RA. He was recently evaluated by his PCP for increased LEE prompting increased Lasix dosage and f/u in our office. At appointment 11/12/16 he was found to be in rapid atrial fib in the 140s. He was maxed out on carvedilol. Cardizem CD 145m was added for improved rate control. Simvastatin was switched to low dose rosuvastatin. Labs 11/12/16 showed Na 145, K 3.7, BUN 12, Cr 1.59 (recent baseline Cr ~1.5), pBNP 1651. He had been snacking on high sodium foods, such as potato chips and peanut butter  crackers. F/u echo 11/21/16 showed EF 25-30%, diffuse HK, severe LAE, moderately reduced RV function, mod RAE, trivial TR, acquired while in AF RVR.  He returns for follow-up. HR still 125 on EKG, coming down to 110 while resting. Overall he states he feels "OK" but does report increased DOE since March 2018. He feels fine at rest, however. No recent chest pain/angina. Mild lower extremity edema persists. Weight is down 1lb since last OV. He remains noncompliant with sodium restriction. He also admits he has not been using CPAP every day because he sleeps much better without it. Denies any bleeding on blood thinners. Reports he [facility]   Past Medical History:  Diagnosis Date  . Arthritis   . CAD in native artery    a. reported MI 2000, 2001, s/p stenting of the circumflex lesion extending into the second obtuse marginal branch with a drug-eluting stent in 2003. b. low risk nuc 2015.  .[place]ronic atrial fibrillation ([place]   . Chronic combined systolic and diastolic [place]    a. Previously diastolic, then EF [id number] 10/2016.  . CKD (chronic kidney disease), stage III   . Depression   . Diabetes mellitus   . Edema of extremities   . Erectile dysfunction   . GERD (gastroesophageal reflux disease)   . Hyperlipidemia   . Hypertension   . [place]  . Obesity   . Onychomycosis   . OSA (obstructive  sleep apnea)   . Tubular adenoma of colon 10/2002    Past Surgical History:  Procedure Laterality Date  . ANGIOPLASTY  2001   stent x 1  . COLONOSCOPY  2000?   negative  . TOE AMPUTATION Left 2012    Current Medications: Current Meds  Medication Sig  . amLODipine (NORVASC) 5 MG tablet TAKE 1 TABLET BY MOUTH DAILY  . apixaban (ELIQUIS) 5 MG TABS tablet Take 1 tablet (5 mg total) by mouth 2 (two) times daily.  . calcitRIOL (ROCALTROL) 0.25 MCG capsule TAKE 1 (ONE) CAPSULE BY MOUTH EVERY MON WED AND FRIDAY  .  carvedilol (COREG) 25 MG tablet TAKE 1 TABLET BY MOUTH TWICE DAILY WITH A MEAL  . diltiazem (CARDIZEM CD) 120 MG 24 hr capsule Take 1 capsule (120 mg total) by mouth daily.  . furosemide (LASIX) 80 MG tablet Take 1 tablet (80 mg total) by mouth 2 (two) times daily.  Anthony [name] gabapentin (NEURONTIN) 300 MG capsule TAKE ONE CAPSULE BY MOUTH AT BEDTIME  . insulin NPH-regular Human (NOVOLIN 70/30) (70-30) 100 UNIT/ML injection Inject 35 Units into the skin 2 (two) times daily.  . isosorbide mononitrate (IMDUR) 60 MG 24 hr tablet TAKE 1 TABLET BY MOUTH DAILY  . losartan (COZAAR) 100 MG tablet TAKE ONE TABLET BY MOUTH DAILY  . metFORMIN (GLUCOPHAGE) 1000 MG tablet TAKE 1 TABLET BY MOUTH TWICE DAILY WITH A MEAL  . Multiple Vitamin (MULTIVITAMIN) tablet Take 1 tablet by mouth daily.    . nicotine (NICODERM CQ - DOSED IN MG/24 HR) 7 mg/24hr patch Place 1 patch (7 mg total) onto the skin daily.  . nitroGLYCERIN (NITROSTAT) 0.4 MG SL tablet Place 0.4 mg under the tongue every 5 (five) minutes as needed. If no relief by 3rd tab, call 911   . Omega 3 1000 MG CAPS Take 3 capsules by mouth daily.  Anthony [name] oxymorphone (OPANA) 5 MG tablet Take 5-10 mg by mouth every 8 (eight) hours. Per Dr Brien Few  . PARoxetine (PAXIL) 40 MG tablet Take 1 tablet (40 mg total) by mouth every morning.  . potassium chloride SA (K-DUR,KLOR-CON) 20 MEQ tablet Take 1 tablet (20 mEq total) by mouth 2 (two) times daily.  . ranitidine (ZANTAC) 150 MG tablet Take 150 mg by mouth daily.  . rosuvastatin (CRESTOR) 5 MG tablet Take 1 tablet (5 mg total) by mouth daily.     Allergies:   Enalapril maleate   Social History   Social History  . Marital status: Married    Spouse name: [name]  . Number of children: 2  . 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 Smoking status: Former Smoker    Packs/day: 0.25    Years: 45.00    Types: Cigarettes    Start date: 05/28/1967     Quit date: 06/11/2016  . Smokeless tobacco: Never Used  . Alcohol use No     Comment: quit in 1993  . Drug use: No  . Sexual activity: Not Asked   Other Topics Concern  . None   Social History Narrative   Patient is right handed, consumes caffeine rarely. Patient resides in home with wife.     Family History:  Family History  Problem Relation Age of Onset  . Heart attack Mother   . Heart attack Father   . Colon cancer Neg Hx   . Stomach cancer Neg Hx  ROS:   Please see the history of present illness.  All other systems are reviewed and otherwise negative.    PHYSICAL EXAM:   VS:  BP 132/70   Pulse (!) 125   Ht 6' 1"$  (1.854 m)   Wt 243 lb 6.4 oz (110.4 kg)   SpO2 98%   BMI 32.11 kg/m   BMI: Body mass index is 32.11 kg/m. GEN: Well nourished, well developed obese AAM, in no acute distress  HEENT: normocephalic, atraumatic Neck: no JVD, carotid bruits, or masses Cardiac: irregularly irregular, tachycardic; no murmurs, rubs, or gallops, 1+ bilateral pitting edema with compression stocking in place Respiratory:  clear to auscultation bilaterally, normal work of breathing GI: soft, nontender, nondistended, + BS MS: no deformity or atrophy  Skin: warm and dry, no rash Neuro:  Alert and Oriented x 3, Strength and sensation are intact, follows commands Psych: euthymic mood, full affect  Wt Readings from Last 3 Encounters:  11/26/16 243 lb 6.4 oz (110.4 kg)  11/12/16 244 lb (110.7 kg)  11/07/16 250 lb 6.4 oz (113.6 kg)      Studies/Labs Reviewed:   EKG:  EKG was ordered today and personally reviewed by me and demonstrates atrial fib RVR 125bpm, nonspecific TW changes with TW flattening I, avL, III, avF, and TWI V4-V6  Recent Labs: 02/28/2016: ALT 21; Hemoglobin 13.7; Platelets 160 11/21/2016: BUN 11; Creatinine, Ser 1.60; NT-Pro BNP 930; Potassium 3.7; Sodium 139   Lipid Panel    Component Value Date/Time   CHOL 110 (L) 02/28/2016 0906   TRIG 94 02/28/2016  0906   HDL 34 (L) 02/28/2016 0906   CHOLHDL 3.2 02/28/2016 0906   VLDL 19 02/28/2016 0906   LDLCALC 57 02/28/2016 0906   LDLDIRECT 58 11/06/2011 1434    Additional studies/ records that were reviewed today include: Summarized above, includes:  Cath 2003  DIAGNOSTIC FINDINGS:  1. Left main:  Angiographically normal.  2. LAD:  The LAD is a large vessel giving rise to two diagonal branches.  It is angiographically normal.  3. Circumflex:  The circumflex is a large vessel giving rise to two obtuse marginal branches.  There is a 60-70% stenosis of the mid circumflex  extending into the proximal portion of the second obtuse marginal branch.  4. RCA:  The RCA is a large vessel.  There is a 20% stenosis of the mid vessel.  5. LV:  EF equals 65% without regional wall motion abnormality.  2D Echo 11/21/16 Study Conclusions - Left ventricle: The cavity size was normal. There was severe   concentric hypertrophy. Systolic function was severely reduced.   The estimated ejection fraction was in the range of 25% to 30%.   Diffuse hypokinesis. - Left atrium: The atrium was severely dilated. - Right ventricle: Systolic function was moderately reduced. - Right atrium: The atrium was moderately dilated. - Tricuspid valve: There was trivial regurgitation. - Pulmonary arteries: Systolic pressure was at the upper limits of   normal. - Inferior vena cava: The vessel was normal in size. - Pericardium, extracardiac: There was no pericardial effusion. Impressions: - When compared to the prior study from 02/23/2014, LVEF has   decreased from 55-60% to 25-30% with diffuse hypokinesis, RVEF is   moderately decreased. This is possibly tachycardia mediated, the   study was acquired while patient in atrial fibrillation with RVR.  ASSESSMENT & PLAN:   1. Acute combined CHF - the patient remains mildly volume overloaded at this time despite higher dose of Lasix, but  does not appear to be tenuously decompensated.  Weight remains 2lb above prior euvolemic baseline in 2017. It is difficult to know what is the chicken or the egg here. In the past he's had frequent episodes of HR in the 110-120 range associated with CHF. He reports noncompliance with dietary recommendations and continues to eat high salt foods. Reviewed importance of 20105m sodium/2L fluid restriction, daily weights. Etiology of LV dysfunction not totally clear at this time but may be tachy-mediated. I have reviewed with Dr. CCurt Bearsas options for treating atrial fib are limited at this point, but his AF RVR is likely contributing to his presentation. Will check BMET today to decide on escalation of diuretic therapy including consideration of spironolactone versus metolazone. Unfortunately with his LV dysfunction, diltiazem is not an ideal choice for his atrial fib but his HR does appear mildly improved with this thus will continue for now. See below. Will stop amlodipine as he is already on diltiazem. Will need to follow BP with this change. 2. Chronic atrial fibrillation with rapid ventricular rates - see above. Prior intolerances to amiodarone and digoxin per Dr. NMeda Coffee Reviewed with Dr. CCurt Bears- limited options here given severe LAE and longstanding persistence of atrial fib without prior restoration of NSR. Not a candidate for Multaq given CHF, Tikosyn less ideal with CKD and current QTc >440 on EKG today, not a candidate for flecainide given structural disease. May need to consider AVN ablation with pacemaker. Will refer to EP as discussed. Also needs updated CBC and thyroid to r/o other issue driving rapid AF as well as CXR given persistent SOB x 4 months. As above, will check BMET in hopes of being able to escalate diuretic therapy, but renal function may limit this. 3. CAD - no recent anginal symptoms to suggest ischemia. May need to consider nuc in the future when HR better controlled. Per BLyda Jesterwho recently changed statin, recheck  LFTS/lipids in 4 weeks - would consider further statin titration in the future given his CAD. 4. CKD stage III - recheck today. 5. OSA - discussed importance of compliance with CPAP, although I am not optimistic given his overall intolerance.  Disposition: F/u with EP next available.   Medication Adjustments/Labs and Tests Ordered: Current medicines are reviewed at length with the patient today.  Concerns regarding medicines are outlined above. Medication changes, Labs and Tests ordered today are summarized above and listed in the Patient Instructions accessible in Encounters.   Signed, DCharlie Pitter PA-C  11/26/2016 8:44 AM    [place]  [address]Phone: [phone] Fax: [phone]

## 2016-11-25 NOTE — Telephone Encounter (Signed)
**Note De-Identified Zymeir Salminen Obfuscation** Per [facility] a PA for Furosemide is not needed as it should have been covered by workman's comp.

## 2016-11-26 ENCOUNTER — Encounter: Payer: Self-pay | Admitting: Physician Assistant

## 2016-11-26 ENCOUNTER — Ambulatory Visit (INDEPENDENT_AMBULATORY_CARE_PROVIDER_SITE_OTHER): Payer: PPO | Admitting: Physician Assistant

## 2016-11-26 ENCOUNTER — Telehealth: Payer: Self-pay | Admitting: Cardiology

## 2016-11-26 VITALS — BP 132/70 | HR 125 | Ht 73.0 in | Wt 243.4 lb

## 2016-11-26 DIAGNOSIS — I2510 Atherosclerotic heart disease of native coronary artery without angina pectoris: Secondary | ICD-10-CM | POA: Diagnosis not present

## 2016-11-26 DIAGNOSIS — G4733 Obstructive sleep apnea (adult) (pediatric): Secondary | ICD-10-CM

## 2016-11-26 DIAGNOSIS — N183 Chronic kidney disease, stage 3 unspecified: Secondary | ICD-10-CM

## 2016-11-26 DIAGNOSIS — I5041 Acute combined systolic (congestive) and diastolic (congestive) heart failure: Secondary | ICD-10-CM

## 2016-11-26 DIAGNOSIS — I482 Chronic atrial fibrillation, unspecified: Secondary | ICD-10-CM

## 2016-11-26 LAB — CBC
Hematocrit: 38.9 % (ref 37.5–51.0)
Hemoglobin: 13.8 g/dL (ref 13.0–17.7)
MCH: 28.9 pg (ref 26.6–33.0)
MCHC: 35.5 g/dL (ref 31.5–35.7)
MCV: 82 fL (ref 79–97)
Platelets: 137 10*3/uL — ABNORMAL LOW (ref 150–379)
RBC: 4.77 x10E6/uL (ref 4.14–5.80)
RDW: 14.6 % (ref 12.3–15.4)
WBC: 7.1 10*3/uL (ref 3.4–10.8)

## 2016-11-26 LAB — BASIC METABOLIC PANEL
BUN/Creatinine Ratio: 8 — ABNORMAL LOW (ref 10–24)
BUN: 14 mg/dL (ref 8–27)
CO2: 25 mmol/L (ref 20–29)
Calcium: 9.3 mg/dL (ref 8.6–10.2)
Chloride: 102 mmol/L (ref 96–106)
Creatinine, Ser: 1.82 mg/dL — ABNORMAL HIGH (ref 0.76–1.27)
GFR calc Af Amer: 44 mL/min/{1.73_m2} — ABNORMAL LOW (ref >59)
GFR calc non Af Amer: 38 mL/min/{1.73_m2} — ABNORMAL LOW (ref >59)
Glucose: 196 mg/dL — ABNORMAL HIGH (ref 65–99)
Potassium: 3.6 mmol/L (ref 3.5–5.2)
Sodium: 140 mmol/L (ref 134–144)

## 2016-11-26 LAB — TSH: TSH: 1.32 u[IU]/mL (ref 0.450–4.500)

## 2016-11-26 LAB — MAGNESIUM: Magnesium: 1.5 mg/dL — ABNORMAL LOW (ref 1.6–2.3)

## 2016-11-26 NOTE — Telephone Encounter (Signed)
Returned pts call. See lab result note.

## 2016-11-26 NOTE — Telephone Encounter (Signed)
Anthony [name] is returning your call . Thanks

## 2016-11-26 NOTE — Telephone Encounter (Signed)
-----   Message from Charlie Pitter, [place] sent at 11/26/2016  3:46 PM EDT ----- Mg level also noted to be low - had reviewed healthier diet choices with patient at his OV. When able to reach patient, would ask him to please increase dietary intake of healthy sources of magnesium including leafy greens, nuts, seeds, fish, beans, whole grains, avocados, yogurt, and bananas. Dayna Dunn PA-C

## 2016-11-26 NOTE — Patient Instructions (Addendum)
Medication Instructions:  Your physician has recommended you make the following change in your medication:  1.  STOP the Amlodipine  Labwork: TODAY:  STAT:  BMET, CBC, TSH, & MAG 4 WEEKS:  FASTING LIPID & LFT  Testing/Procedures: A chest x-ray takes a picture of the organs and structures inside the chest, including the heart, lungs, and blood vessels. This test can show several things, including, whether the heart is enlarges; whether fluid is building up in the lungs; and whether pacemaker / defibrillator leads are still in place. Go to [place], [place] to have this done today   Follow-Up: Your physician recommends that you schedule a follow-up appointment in: [place] [facility]   Any Other Special Instructions Will Be Listed Below (If Applicable).  For patients with congestive heart failure, we give them these special instructions:  1. Follow a low-salt diet - you are allowed no more than 2,000mg  of sodium per day. Watch your fluid intake. In general, you should not be taking in more than 2 liters of fluid per day (no more than 8 glasses per day). This includes sources of water in foods like soup, coffee, tea, milk, etc. 2. Weigh yourself on the same scale at same time of day and keep a log. 3. Call your doctor: (Anytime you feel any of the following symptoms)  - 3lb weight gain overnight or 5lb within a few days - Shortness of breath, with or without a dry hacking cough  - Swelling in the hands, feet or stomach  - If you have to sleep on extra pillows at night in order to breathe   IT IS IMPORTANT TO LET YOUR DOCTOR KNOW EARLY ON IF YOU ARE HAVING SYMPTOMS SO WE CAN HELP YOU!     If you need a refill on your cardiac medications before your next appointment, please call your pharmacy.

## 2016-11-27 NOTE — Progress Notes (Signed)
ELECTROPHYSIOLOGY CONSULT NOTE  Patient ID: Anthony [name], MRN: [id number], DOB/AGE: 1950/07/17 66 y.o. Admit date: (Not on file) Date of Consult: 11/28/2016  Primary Physician: Sela Hilding, MD Primary Cardiologist: Johnny Bridge is being seen today for the evaluation of rapid atrial fib at the request of Dayna Dunn PA/KN   HPI Anthony [name] is a 66 y.o. male  Referred for consideration of AV ablation   Has permanent atrial fibrillation with efforts to control rate incl BB and diltiazem    Anticoagulated with apixoban   Recently  seen by his primary care provider 6/18 with worsening lower extremity edema. Diuretics were increased. Reportedly his symptoms were relatively mild. Amlodipine was discontinued and diltiazem was added for augmentation of rate control as his rates were in the 140s when seen by cardiology. He was already on carvedilol.  He has a history of coronary artery disease with prior stenting of the circumflex in 2003.  He is treated sleep apnea with incomplete compliance   She has noted only mild shortness of breath over the last 6-12 months without appreciable change. Immediate noted more change over the last couple of weeks since institution of diltiazem. The edema is better. He has not had nocturnal dyspnea orthopnea. He has not had palpitations or syncope.   He has some orthostatic lightheadedness particularly with bending at the end of the day  ECG w narrow QRS  DATE TEST    7/15  Myoview    No ischemia  9/15  Echo   EF 55-60 %   6/18 Echo EF 25-30% Severe LAE     Date Cr K Mg Hgb  10/17 1.55 3.4  13.7  7/18 1.82  1.5  13.8      Past Medical History:  Diagnosis Date  . Arthritis   . CAD in native artery    a. reported MI 2000, 2001, s/p stenting of the circumflex lesion extending into the second obtuse marginal branch with a drug-eluting stent in 2003. b. low risk nuc 2015.  Marland Kitchen Chronic atrial fibrillation ([place])   . Chronic  combined systolic and diastolic CHF (congestive heart failure) ([place])    a. Previously diastolic, then EF 18-84% in 10/2016.  . CKD (chronic kidney disease), stage III   . Depression   . Diabetes mellitus   . Edema of extremities   . Erectile dysfunction   . GERD (gastroesophageal reflux disease)   . Hyperlipidemia   . Hypertension   . Myocardial infarction ([place]) 2000&2001  . Obesity   . Onychomycosis   . OSA (obstructive sleep apnea)   . Tubular adenoma of colon 10/2002      Surgical History:  Past Surgical History:  Procedure Laterality Date  . ANGIOPLASTY  2001   stent x 1  . COLONOSCOPY  2000?   negative  . TOE AMPUTATION Left 2012     Home Meds: Prior to Admission medications   Medication Sig Start Date End Date Taking? Authorizing Provider  calcitRIOL (ROCALTROL) 0.25 MCG capsule TAKE 1 (ONE) CAPSULE BY MOUTH EVERY MON WED AND FRIDAY 08/06/16   [provider]  carvedilol (COREG) 25 MG tablet TAKE 1 TABLET BY MOUTH TWICE DAILY WITH A MEAL 07/01/16   Rumley, [place] N, DO  diltiazem (CARDIZEM CD) 120 MG 24 hr capsule Take 1 capsule (120 mg total) by mouth daily. 11/12/16   Simmons, Brittainy M, PA-C  ELIQUIS 5 MG TABS tablet Take 5 mg by  mouth 2 (two) times daily. 11/21/16   [provider]  furosemide (LASIX) 80 MG tablet Take 1 tablet (80 mg total) by mouth 2 (two) times daily. 11/14/16 02/12/17  Lyda Jester M, PA-C  gabapentin (NEURONTIN) 300 MG capsule TAKE ONE CAPSULE BY MOUTH AT BEDTIME 11/21/16   Rumley, [place] N, DO  insulin NPH-regular Human (NOVOLIN 70/30) (70-30) 100 UNIT/ML injection Inject 35 Units into the skin 2 (two) times daily.    [provider]  isosorbide mononitrate (IMDUR) 60 MG 24 hr tablet TAKE 1 TABLET BY MOUTH DAILY 03/05/16   Dorothy Spark, MD  losartan (COZAAR) 100 MG tablet TAKE ONE TABLET BY MOUTH DAILY 07/23/16   Rumley, [place] N, DO  metFORMIN (GLUCOPHAGE) 1000 MG tablet TAKE 1 TABLET BY MOUTH TWICE DAILY WITH A  MEAL 09/09/16   Rumley, [place] N, DO  Multiple Vitamin (MULTIVITAMIN) tablet Take 1 tablet by mouth daily.      [provider]  nicotine (NICODERM CQ - DOSED IN MG/24 HR) 7 mg/24hr patch Place 1 patch (7 mg total) onto the skin daily. 01/02/16   Rumley, Burna Cash, DO  nitroGLYCERIN (NITROSTAT) 0.4 MG SL tablet Place 0.4 mg under the tongue every 5 (five) minutes as needed. If no relief by 3rd tab, call 911     [provider]  Omega 3 1000 MG CAPS Take 3 capsules by mouth daily.    [provider]  oxymorphone (OPANA) 5 MG tablet Take 5-10 mg by mouth every 8 (eight) hours. Per Dr Alvin Critchley, MD  PARoxetine (PAXIL) 40 MG tablet Take 1 tablet (40 mg total) by mouth every morning. 01/15/16   Rumley, [place] N, DO  potassium chloride SA (K-DUR,KLOR-CON) 20 MEQ tablet Take 1 tablet (20 mEq total) by mouth 2 (two) times daily. 11/14/16   Lyda Jester M, PA-C  ranitidine (ZANTAC) 150 MG tablet Take 150 mg by mouth daily.    [provider]  rosuvastatin (CRESTOR) 5 MG tablet Take 1 tablet (5 mg total) by mouth daily. 11/12/16 02/10/17  Consuelo Pandy, PA-C    Allergies:  Allergies  Allergen Reactions  . Enalapril Maleate Cough    REACTION: cough    Social History   Social History  . Marital status: Married    Spouse name: Anthony [name]  . Number of children: 2  . 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 Smoking status: Former Smoker    Packs/day: 0.25    Years: 45.00    Types: Cigarettes    Start date: 05/28/1967    Quit date: 06/11/2016  . Smokeless tobacco: Never Used  . Alcohol use No     Comment: quit in 1993  . Drug use: No  . Sexual activity: Not on file   Other Topics Concern  . Not on file   Social History Narrative   Patient is right handed, consumes caffeine rarely. Patient resides in home with wife.     Family History  Problem Relation Age  of Onset  . Heart attack Mother   . Heart attack Father   . Colon cancer Neg Hx   . Stomach cancer Neg Hx      ROS:  Please see the history of present illness.     All other systems reviewed and negative.    Physical Exam:  Blood pressure 100/62, pulse (!) 104, height  6\' 1"  (1.854 m), weight 246 lb 12 oz (111.9 kg). General: Well developed, well nourished male in no acute distress. Head: Normocephalic, atraumatic, sclera non-icteric, no xanthomas, nares are without discharge. EENT: normal  Lymph Nodes:  none Neck: Negative for carotid bruits. JVD 6-7 cm  Back:without scoliosis kyphosis Lungs: Clear bilaterally to auscultation without wheezes, rales, or rhonchi. Breathing is unlabored. Heart: Irregular rate and rhythm with a 2 /6 systolic murmur . No rubs, or gallops appreciated. Abdomen: Soft, non-tender, non-distended with normoactive bowel sounds. No hepatomegaly. No rebound/guarding. No obvious abdominal masses. Msk:  Strength and tone appear normal for age. Extremities: No clubbing or cyanosis.  1 edema.  Distal pedal pulses are 2+ and equal bilaterally. Skin: Warm and Dry Neuro: Alert and oriented X 3. CN III-XII intact Grossly normal sensory and motor function . Psych:  Responds to questions appropriately with a normal affect.      Labs: Cardiac Enzymes No results for input(s): CKTOTAL, CKMB, TROPONINI in the last 72 hours. CBC Lab Results  Component Value Date   WBC 7.1 11/26/2016   HGB 13.8 11/26/2016   HCT 38.9 11/26/2016   MCV 82 11/26/2016   PLT 137 (L) 11/26/2016   PROTIME: No results for input(s): LABPROT, INR in the last 72 hours. Chemistry   Recent Labs Lab 11/26/16 0928  NA 140  K 3.6  CL 102  CO2 25  BUN 14  CREATININE 1.82*  CALCIUM 9.3  GLUCOSE 196*   Lipids Lab Results  Component Value Date   CHOL 110 (L) 02/28/2016   HDL 34 (L) 02/28/2016   LDLCALC 57 02/28/2016   TRIG 94 02/28/2016   BNP Pro B Natriuretic peptide (BNP)    Date/Time Value Ref Range Status  08/31/2014 11:27 AM 234.0 (H) 0.0 - 100.0 pg/mL Final   NT-Pro BNP  Date/Time Value Ref Range Status  11/21/2016 03:08 PM 930 (H) 0 - 376 pg/mL Final    Comment:    The following cut-points have been suggested for the use of proBNP for the diagnostic evaluation of heart failure (HF) in patients with acute dyspnea: Modality                     Age           Optimal Cut                            (years)            Point ------------------------------------------------------ Diagnosis (rule in HF)        <50            450 pg/mL                           50 - 75            900 pg/mL                               >75           1800 pg/mL Exclusion (rule out HF)  Age independent     300 pg/mL   11/12/2016 11:31 AM 1,651 (H) 0 - 376 pg/mL Final    Comment:    The following cut-points have been suggested for the use of proBNP for the diagnostic evaluation of heart failure (HF) in patients  with acute dyspnea: Modality                     Age           Optimal Cut                            (years)            Point ------------------------------------------------------ Diagnosis (rule in HF)        <50            450 pg/mL                           50 - 75            900 pg/mL                               >75           1800 pg/mL Exclusion (rule out HF)  Age independent     300 pg/mL    Thyroid Function Tests:  Recent Labs  11/26/16 0929  TSH 1.320   Miscellaneous No results found for: DDIMER  Radiology/Studies:  No results found.  EKG:  Atrial fibrillation at 106 Intervals-/08/34 Low-voltage limb leads Borderline left axis deviation  Assessment and Plan:  Atrial fibrillation-permanent rapid ventricular response  Low-voltage limb leads and severe concentric LVH concerning for amyloid    congestive heart failure-chronic-systolic-class IIb 3  Hypertension  Nonischemic cardiomyopathy-new   The patient has permanent atrial  fibrillation with a relatively rapid rate. He has also developed a worsening cardiomyopathy cause of which is not clear and its relationship to his rate is not clear. A presumption is that it is rate related and indeed targeting control of rate I think is essential. However, given the low voltages in his limb leads and severe concentric LVH I am concerned that he has amyloid. African-American is a transthyretin variant is quite common. This has significant impact on medication usage as calcium blockers and digoxin are both contraindicated. Hence, we will discontinue the former. To try to accomplish better rate control we will switch him from carvedilol--metoprolol succinate in the hopes that we can get more beta blocker in for the relative change in blood pressure in the absence of an alpha-blocker.  To explore amyloid, we will undertake an MRI with gadolinium. We will need to check with Dr. Lorrene Reid as to whether GFR is sufficient to allow this to be done safely . We will also pursue fat pad biopsy. Given the GFR issue, we will plan to do the fat pad biopsy first and the MRI if nondiagnostic   I will have to do some more reading but my impression is electrophoresis is not so valuable in transthyretin amyloid.    He will likely need AV junction ablation and device implantation. However, his symptoms are not so dramatic and so I have told him that while these issues are concerning they are not so emergent as was suggested by the dramatic nature which this appointment was scheduled   Virl Axe

## 2016-11-28 ENCOUNTER — Telehealth: Payer: Self-pay | Admitting: Internal Medicine

## 2016-11-28 ENCOUNTER — Encounter: Payer: Self-pay | Admitting: Internal Medicine

## 2016-11-28 ENCOUNTER — Ambulatory Visit (INDEPENDENT_AMBULATORY_CARE_PROVIDER_SITE_OTHER): Payer: PPO | Admitting: Internal Medicine

## 2016-11-28 VITALS — BP 100/62 | HR 104 | Ht 73.0 in | Wt 246.8 lb

## 2016-11-28 DIAGNOSIS — E859 Amyloidosis, unspecified: Secondary | ICD-10-CM | POA: Diagnosis not present

## 2016-11-28 DIAGNOSIS — I482 Chronic atrial fibrillation, unspecified: Secondary | ICD-10-CM

## 2016-11-28 MED ORDER — METOPROLOL SUCCINATE ER 50 MG PO TB24: 50.0000 mg | ORAL_TABLET | Freq: Two times a day (BID) | ORAL | 3 refills | Status: DC

## 2016-11-28 MED ORDER — METOPROLOL SUCCINATE ER 50 MG PO TB24: 50.0000 mg | ORAL_TABLET | Freq: Every day | ORAL | 3 refills | Status: DC

## 2016-11-28 NOTE — Telephone Encounter (Signed)
I called and spoke with the pharmacist- I advised that the patient's metoprolol succinate RX should be for 50 mg BID.

## 2016-11-28 NOTE — Patient Instructions (Addendum)
Medication Instructions: - Your physician has recommended you make the following change in your medication:  1) Stop Diltiazem 2) Stop Coreg (carvedilol) 3) Start Toprol (metoprolol succinate) 50 mg- take one tablet by mouth twice daily  Labwork: - none ordered  Procedures/Testing: - Your physician has requested that you have a cardiac MRI. Cardiac MRI uses a computer to create images of your heart as its beating, producing both still and moving pictures of your heart and major blood vessels. For further information please visit [email or web address]   - Your physician has requested that you have a fat pad biopsy  ** our office will be in touch with you to schedule both of these **  Follow-Up: - pending results of your tests  Any Additional Special Instructions Will Be Listed Below (If Applicable).     If you need a refill on your cardiac medications before your next appointment, please call your pharmacy.

## 2016-11-28 NOTE — Telephone Encounter (Signed)
Pharmacist has received 2 different instructions for Metoprolol. Please call.

## 2016-11-29 ENCOUNTER — Telehealth: Payer: Self-pay | Admitting: Internal Medicine

## 2016-11-29 NOTE — Telephone Encounter (Signed)
I called radiology this morning to schedule the patient for his fat pad biopsy [phone].   Scheduled for Thursday 12/05/16 at [facility] Arrive at 11:30 am to Admitting NPO after 7am  Needs driver Cardiac/ HTN meds are ok Needs to take last dose of eliquis on Monday night Plan to be there 4-5 hours from admitting to finish.  I left a message for the patient to call.

## 2016-11-29 NOTE — Telephone Encounter (Signed)
I spoke with the patient. He verbalizes understanding for instructions for his fat pad biopsy. He is agreeable with the date and time for these.

## 2016-11-29 NOTE — Addendum Note (Signed)
Addended byAlvis Lemmings C on: 11/29/2016 11:26 AM   Modules accepted: Orders

## 2016-12-03 ENCOUNTER — Institutional Professional Consult (permissible substitution): Payer: PPO | Admitting: Internal Medicine

## 2016-12-04 ENCOUNTER — Encounter: Payer: Self-pay | Admitting: Internal Medicine

## 2016-12-04 ENCOUNTER — Other Ambulatory Visit: Payer: Self-pay | Admitting: Radiology

## 2016-12-05 ENCOUNTER — Ambulatory Visit (HOSPITAL_COMMUNITY)
Admission: RE | Admit: 2016-12-05 | Discharge: 2016-12-05 | Disposition: A | Discharge status: Home / Self Care | Payer: PPO | Source: Ambulatory Visit | Attending: Internal Medicine | Admitting: Internal Medicine

## 2016-12-05 DIAGNOSIS — E1122 Type 2 diabetes mellitus with diabetic chronic kidney disease: Secondary | ICD-10-CM | POA: Insufficient documentation

## 2016-12-05 DIAGNOSIS — N183 Chronic kidney disease, stage 3 (moderate): Secondary | ICD-10-CM | POA: Insufficient documentation

## 2016-12-05 DIAGNOSIS — Z87891 Personal history of nicotine dependence: Secondary | ICD-10-CM | POA: Insufficient documentation

## 2016-12-05 DIAGNOSIS — I482 Chronic atrial fibrillation: Secondary | ICD-10-CM | POA: Diagnosis not present

## 2016-12-05 DIAGNOSIS — Z7901 Long term (current) use of anticoagulants: Secondary | ICD-10-CM | POA: Insufficient documentation

## 2016-12-05 DIAGNOSIS — E785 Hyperlipidemia, unspecified: Secondary | ICD-10-CM | POA: Diagnosis not present

## 2016-12-05 DIAGNOSIS — I5042 Chronic combined systolic (congestive) and diastolic (congestive) heart failure: Secondary | ICD-10-CM | POA: Insufficient documentation

## 2016-12-05 DIAGNOSIS — I2510 Atherosclerotic heart disease of native coronary artery without angina pectoris: Secondary | ICD-10-CM | POA: Insufficient documentation

## 2016-12-05 DIAGNOSIS — K219 Gastro-esophageal reflux disease without esophagitis: Secondary | ICD-10-CM | POA: Insufficient documentation

## 2016-12-05 DIAGNOSIS — Z794 Long term (current) use of insulin: Secondary | ICD-10-CM | POA: Diagnosis not present

## 2016-12-05 DIAGNOSIS — Z79899 Other long term (current) drug therapy: Secondary | ICD-10-CM | POA: Diagnosis not present

## 2016-12-05 DIAGNOSIS — G4733 Obstructive sleep apnea (adult) (pediatric): Secondary | ICD-10-CM | POA: Diagnosis not present

## 2016-12-05 DIAGNOSIS — M7989 Other specified soft tissue disorders: Secondary | ICD-10-CM | POA: Diagnosis not present

## 2016-12-05 DIAGNOSIS — I252 Old myocardial infarction: Secondary | ICD-10-CM | POA: Insufficient documentation

## 2016-12-05 DIAGNOSIS — E65 Localized adiposity: Secondary | ICD-10-CM | POA: Diagnosis not present

## 2016-12-05 DIAGNOSIS — Z862 Personal history of diseases of the blood and blood-forming organs and certain disorders involving the immune mechanism: Secondary | ICD-10-CM | POA: Diagnosis not present

## 2016-12-05 DIAGNOSIS — I130 Hypertensive heart and chronic kidney disease with heart failure and stage 1 through stage 4 chronic kidney disease, or unspecified chronic kidney disease: Secondary | ICD-10-CM | POA: Insufficient documentation

## 2016-12-05 DIAGNOSIS — F329 Major depressive disorder, single episode, unspecified: Secondary | ICD-10-CM | POA: Insufficient documentation

## 2016-12-05 DIAGNOSIS — E859 Amyloidosis, unspecified: Secondary | ICD-10-CM

## 2016-12-05 LAB — CBC
HCT: 41.2 % (ref 39.0–52.0)
Hemoglobin: 13.5 g/dL (ref 13.0–17.0)
MCH: 28.3 pg (ref 26.0–34.0)
MCHC: 32.8 g/dL (ref 30.0–36.0)
MCV: 86.4 fL (ref 78.0–100.0)
Platelets: 131 10*3/uL — ABNORMAL LOW (ref 150–400)
RBC: 4.77 MIL/uL (ref 4.22–5.81)
RDW: 14.9 % (ref 11.5–15.5)
WBC: 7.9 10*3/uL (ref 4.0–10.5)

## 2016-12-05 LAB — PROTIME-INR
INR: 1.13
Prothrombin Time: 14.5 seconds (ref 11.4–15.2)

## 2016-12-05 LAB — GLUCOSE, CAPILLARY: Glucose-Capillary: 252 mg/dL — ABNORMAL HIGH (ref 65–99)

## 2016-12-05 LAB — APTT: aPTT: 32 seconds (ref 24–36)

## 2016-12-05 MED ORDER — FENTANYL CITRATE (PF) 100 MCG/2ML IJ SOLN
INTRAMUSCULAR | Status: AC | PRN
  Administered 2016-12-05: 25 ug via INTRAVENOUS
  Administered 2016-12-05: 50 ug via INTRAVENOUS

## 2016-12-05 MED ORDER — SODIUM CHLORIDE 0.9 % IV SOLN
INTRAVENOUS | Status: DC
  Administered 2016-12-05: 13:00:00 via INTRAVENOUS

## 2016-12-05 MED ORDER — FENTANYL CITRATE (PF) 100 MCG/2ML IJ SOLN
INTRAMUSCULAR | Status: AC
  Filled 2016-12-05: qty 4

## 2016-12-05 MED ORDER — MIDAZOLAM HCL 2 MG/2ML IJ SOLN
INTRAMUSCULAR | Status: AC | PRN
  Administered 2016-12-05 (×2): 1 mg via INTRAVENOUS

## 2016-12-05 MED ORDER — MIDAZOLAM HCL 2 MG/2ML IJ SOLN
INTRAMUSCULAR | Status: AC
  Filled 2016-12-05: qty 4

## 2016-12-05 MED ORDER — LIDOCAINE HCL (PF) 1 % IJ SOLN
INTRAMUSCULAR | Status: AC
  Filled 2016-12-05: qty 10

## 2016-12-05 NOTE — Sedation Documentation (Signed)
Successful fat pad biopsy.  Band aid applied.  No bleeding.

## 2016-12-05 NOTE — Progress Notes (Signed)
Pt does not have a ride till after his wife is out of work, no other person can pick him up.

## 2016-12-05 NOTE — H&P (Signed)
Chief Complaint: Patient was seen in consultation today for amyloidosis  Referring Physician(s): Deboraha Sprang  Supervising Physician: Corrie Mckusick  Patient Status: [facility] - Out-pt  History of Present Illness: Anthony [name] is a 66 y.o. male with past medical history of a fib, CKD Stage 3, DM, GERD, HTN, HLD, CAD who presents with concern for amyloidosis.   IR consulted for fad pad biopsy at the request of Dr. Virl Axe.  Patient presents for procedure today in his usual state of health.  He has been NPO.  He takes Elliquis at home which he has appropriately held since Sunday.   Past Medical History:  Diagnosis Date  . Arthritis   . CAD in native artery    a. reported MI 2000, 2001, s/p stenting of the circumflex lesion extending into the second obtuse marginal branch with a drug-eluting stent in 2003. b. low risk nuc 2015.  Marland Kitchen Chronic atrial fibrillation ([place])   . Chronic combined systolic and diastolic CHF (congestive heart failure) ([place])    a. Previously diastolic, then EF [id number] in 10/2016.  . CKD (chronic kidney disease), stage III   . Depression   . Diabetes mellitus   . Edema of extremities   . Erectile dysfunction   . GERD (gastroesophageal reflux disease)   . Hyperlipidemia   . Hypertension   . Myocardial infarction ([place]) 2000&2001  . Obesity   . Onychomycosis   . OSA (obstructive sleep apnea)   . Tubular adenoma of colon 10/2002    Past Surgical History:  Procedure Laterality Date  . ANGIOPLASTY  2001   stent x 1  . COLONOSCOPY  2000?   negative  . TOE AMPUTATION Left 2012    Allergies: Enalapril maleate  Medications: Prior to Admission medications   Medication Sig Start Date End Date Taking? Authorizing Provider  calcitRIOL (ROCALTROL) 0.25 MCG capsule TAKE 0.25 MG BY MOUTH EVERY MON WED AND FRIDAY 08/06/16  Yes [provider]  ELIQUIS 5 MG TABS tablet Take 5 mg by mouth 2 (two) times daily. 11/21/16  Yes [provider]    furosemide (LASIX) 80 MG tablet Take 1 tablet (80 mg total) by mouth 2 (two) times daily. Patient taking differently: Take 80 mg by mouth daily.  11/14/16 02/12/17 Yes Simmons, Brittainy M, PA-C  gabapentin (NEURONTIN) 300 MG capsule TAKE ONE CAPSULE BY MOUTH AT BEDTIME Patient taking differently: TAKE 300 MG BY MOUTH AT BEDTIME 11/21/16  Yes Rumley, [place] N, DO  insulin NPH-regular Human (NOVOLIN 70/30) (70-30) 100 UNIT/ML injection Inject 70 Units into the skin daily at 12 noon.    Yes [provider]  isosorbide mononitrate (IMDUR) 60 MG 24 hr tablet TAKE 1 TABLET BY MOUTH DAILY Patient taking differently: TAKE 60 MG BY MOUTH DAILY 03/05/16  Yes Dorothy Spark, MD  losartan (COZAAR) 100 MG tablet TAKE ONE TABLET BY MOUTH DAILY Patient taking differently: TAKE 100 MG BY MOUTH DAILY 07/23/16  Yes Rumley, [place] N, DO  metFORMIN (GLUCOPHAGE) 1000 MG tablet TAKE 1 TABLET BY MOUTH TWICE DAILY WITH A MEAL Patient taking differently: TAKE 1000 MG BY MOUTH TWICE DAILY WITH A MEAL 09/09/16  Yes Rumley, [place] N, DO  metoprolol succinate (TOPROL-XL) 50 MG 24 hr tablet Take 1 tablet (50 mg total) by mouth 2 (two) times daily. Take with or immediately following a meal. 11/28/16 02/26/17 Yes Deboraha Sprang, MD  Multiple Vitamin (MULTIVITAMIN) tablet Take 1 tablet by mouth daily.     Yes [provider]  nicotine (NICODERM CQ - DOSED IN MG/24 HR) 7 mg/24hr patch Place 1 patch (7 mg total) onto the skin daily. 01/02/16  Yes Rumley, [place] N, DO  Omega 3 1000 MG CAPS Take 1,000 mg by mouth 3 (three) times daily.    Yes [provider]  oxymorphone (OPANA) 5 MG tablet Take 5 mg by mouth every 6 (six) hours as needed for pain (max 3 tablets daily). Per Dr Verlin Dike, MD  PARoxetine (PAXIL) 40 MG tablet Take 1 tablet (40 mg total) by mouth every morning. 01/15/16  Yes Rumley, [place] N, DO  potassium chloride SA (K-DUR,KLOR-CON) 20 MEQ tablet Take 1 tablet (20 mEq total)  by mouth 2 (two) times daily. Patient taking differently: Take 20 mEq by mouth daily.  11/14/16  Yes Rosita Fire, Brittainy M, PA-C  ranitidine (ZANTAC) 150 MG tablet Take 150 mg by mouth daily.   Yes [provider]  rosuvastatin (CRESTOR) 5 MG tablet Take 1 tablet (5 mg total) by mouth daily. 11/12/16 02/10/17 Yes Simmons, Brittainy M, PA-C  nitroGLYCERIN (NITROSTAT) 0.4 MG SL tablet Place 0.4 mg under the tongue every 5 (five) minutes as needed for chest pain. If no relief by 3rd tab, call 911     [provider]     Family History  Problem Relation Age of Onset  . Heart attack Mother   . Heart attack Father   . Colon cancer Neg Hx   . Stomach cancer Neg Hx     Social History   Social History  . Marital status: Married    Spouse name: Anthony [name]  . Number of children: 2  . Years of education: GED   Occupational History  . CUSTOMER SERVICES Anthony [name]   Social History Main Topics  . Smoking status: Former Smoker    Packs/day: 0.25    Years: 45.00    Types: Cigarettes    Start date: 05/28/1967    Quit date: 06/11/2016  . Smokeless tobacco: Never Used  . Alcohol use No     Comment: quit in 1993  . Drug use: No  . Sexual activity: Not on file   Other Topics Concern  . Not on file   Social History Narrative   Patient is right handed, consumes caffeine rarely. Patient resides in home with wife.    Review of Systems  Constitutional: Negative for fatigue and fever.  Respiratory: Negative for cough and shortness of breath.   Cardiovascular: Negative for chest pain.  Psychiatric/Behavioral: Negative for behavioral problems and confusion.    Vital Signs: BP 129/63   Pulse (!) 51   Temp 98 F (36.7 C)   Ht '6\' 1"'$  (1.854 m)   Wt 240 lb (108.9 kg)   SpO2 97%   BMI 31.66 kg/m   Physical Exam  Constitutional: He is oriented to person, place, and time. He appears well-developed.  Cardiovascular: Normal rate, regular rhythm and normal heart sounds.     Pulmonary/Chest: Effort normal and breath sounds normal.  Abdominal: Soft. He exhibits no distension.  Neurological: He is alert and oriented to person, place, and time.  Skin: Skin is warm and dry.  Psychiatric: He has a normal mood and affect. His behavior is normal. Judgment and thought content normal.  Nursing note and vitals reviewed.   Mallampati Score:  MD Evaluation Airway: WNL Heart: WNL Abdomen: WNL Chest/ Lungs: WNL ASA  Classification: 3 Mallampati/Airway Score: Two  Imaging: No results  found.  Labs:  CBC:  Recent Labs  02/28/16 0906 11/26/16 0928 12/05/16 1155  WBC 6.9 7.1 7.9  HGB 13.7 13.8 13.5  HCT 39.7 38.9 41.2  PLT 160 137* 131*    COAGS:  Recent Labs  12/05/16 1155  INR 1.13  APTT 32    BMP:  Recent Labs  01/02/16 1407 02/28/16 0906 11/12/16 1131 11/21/16 1508 11/26/16 0928  NA 143 143 145* 139 140  K 3.7 3.4* 3.7 3.7 3.6  CL 106 108 103 100 102  CO2 '25 23 24 22 25  '$ GLUCOSE 61* 157* 186* 245* 196*  BUN '9 13 12 11 14  '$ CALCIUM 8.7 9.1 8.6 8.5* 9.3  CREATININE 1.53* 1.55* 1.59* 1.60* 1.82*  GFRNONAA 47*  --  45* 45* 38*  GFRAA 55*  --  52* 51* 44*    LIVER FUNCTION TESTS:  Recent Labs  02/28/16 0906  BILITOT 0.9  AST 21  ALT 21  ALKPHOS 103  PROT 6.1  ALBUMIN 3.4*    TUMOR MARKERS: No results for input(s): AFPTM, CEA, CA199, CHROMGRNA in the last 8760 hours.  Assessment and Plan: Patient with history of CHF, CAD, MI, DM, GERD, HTN, and concern for amyloidosis. IR consulted for fat pad biopsy at the request of Dr. Caryl Comes.  Patient has been NPO. He held his Elliquis appropriately.  Labs and medications reviewed.  Risks and benefits discussed with the patient including, but not limited to bleeding, infection, damage to adjacent structures or low yield requiring additional tests. All of the patient's questions were answered, patient is agreeable to proceed. Consent signed and in chart.  Thank you for this  interesting consult.  I greatly enjoyed meeting Anthony [name] and look forward to participating in their care.  A copy of this report was sent to the requesting provider on this date.  Electronically Signed: Docia Barrier, PA 12/05/2016, 1:00 PM   I spent a total of    25 Minutes in face to face in clinical consultation, greater than 50% of which was counseling/coordinating care for amyloidosis

## 2016-12-05 NOTE — Discharge Instructions (Signed)
Per pt, xarelto is to be restarted 7/13 Needle Biopsy, Care After These instructions give you information about caring for yourself after your procedure. Your doctor may also give you more specific instructions. Call your doctor if you have any problems or questions after your procedure. Follow these instructions at home:  Rest as told by your doctor.  Take medicines only as told by your doctor.  There are many different ways to close and cover the biopsy site, including stitches (sutures), skin glue, and adhesive strips. Follow instructions from your doctor about: ? How to take care of your biopsy site. ? When and how you should change your bandage (dressing). ? When you should remove your dressing. ? Removing whatever was used to close your biopsy site.  Check your biopsy site every day for signs of infection. Watch for: ? Redness, swelling, or pain. ? Fluid, blood, or pus. Contact a doctor if:  You have a fever.  You have redness, swelling, or pain at the biopsy site, and it lasts longer than a few days.  You have fluid, blood, or pus coming from the biopsy site.  You feel sick to your stomach (nauseous).  You throw up (vomit). Get help right away if:  You are short of breath.  You have trouble breathing.  Your chest hurts.  You feel dizzy or you pass out (faint).  You have bleeding that does not stop with pressure or a bandage.  You cough up blood.  Your belly (abdomen) hurts. This information is not intended to replace advice given to you by your health care provider. Make sure you discuss any questions you have with your health care provider. Document Released: 04/25/2008 Document Revised: 10/19/2015 Document Reviewed: 05/09/2014 Elsevier Interactive Patient Education  [employer].

## 2016-12-05 NOTE — Procedures (Signed)
Interventional Radiology Procedure Note  Procedure: US guided fat pad biopsy, right abd wall.  .  Complications: None  Recommendations:  - Ok to shower tomorrow - Do not submerge for 7 days - Routine care - 1 hour dc   Signed,  Dulcy Fanny. Earleen Newport, DO

## 2016-12-12 ENCOUNTER — Telehealth: Payer: Self-pay | Admitting: Cardiology

## 2016-12-12 ENCOUNTER — Encounter (HOSPITAL_COMMUNITY): Payer: Self-pay

## 2016-12-12 ENCOUNTER — Ambulatory Visit (HOSPITAL_COMMUNITY)
Admission: RE | Admit: 2016-12-12 | Discharge: 2016-12-12 | Disposition: A | Discharge status: Home / Self Care | Payer: PPO | Source: Ambulatory Visit | Attending: Internal Medicine | Admitting: Internal Medicine

## 2016-12-12 DIAGNOSIS — E859 Amyloidosis, unspecified: Secondary | ICD-10-CM

## 2016-12-12 DIAGNOSIS — I517 Cardiomegaly: Secondary | ICD-10-CM

## 2016-12-12 LAB — CREATININE, SERUM
Creatinine, Ser: 1.99 mg/dL — ABNORMAL HIGH (ref 0.61–1.24)
GFR calc Af Amer: 39 mL/min — ABNORMAL LOW (ref >60)
GFR calc non Af Amer: 33 mL/min — ABNORMAL LOW (ref >60)

## 2016-12-12 NOTE — Telephone Encounter (Signed)
I called and spoke with the patient. He states that he went for the cardiac MRI- he reports there was a "plate" placed on his chest and when they put him into the scanner, the "plate" was pushed up towards his chin and he was unable to take a deep breath. He also reports his IV got pulled out as well. He states that tech told him there was a larger scanner coming, but it would be about 8 months before it would be here. I advised the patient I would need to review this with Dr. Caryl Comes. I also inquired if we were able to try this again would he feel like her could undergo the test if he had something for anxiety and he felt this might help. He is aware of his preliminary fat pad biopsy results.  Will review further with Dr. Caryl Comes and call him back next week- he is agreeable.

## 2016-12-12 NOTE — Telephone Encounter (Signed)
New Message   pt verbalized that she is calling for rn

## 2016-12-12 NOTE — Progress Notes (Signed)
Pt came in as an OP for Cardiac MRI.  Larger man, all pads taken off the table, pt could only enter scanner by 'stuffing' him in the magnet.  Pt was unable to breathe while in the scanner because there was no place for his chest to expand and could not tolerate it.

## 2016-12-12 NOTE — Telephone Encounter (Signed)
Pt states he was calling Presenter, broadcasting and Dr Caryl Comes back, to inform them both that he went in for his Cardiac MRI today, and was unable to complete his test, due to claustrophobia.  Pt also calling to ask Nira Conn if his biopsy results are back.  Informed the pt that [place] and Dr Caryl Comes are both out of the office, but currently at our satellite location.  Informed the pt that I will route this message to them both for further review, recommendation, and follow-up with the pt thereafter.  Pt verbalized understanding and agrees with this plan.

## 2016-12-18 NOTE — Telephone Encounter (Signed)
Per Dr. Caryl Comes- there may be a protocol for nuclear medicine testing vs doing the Cardiac MRI on the patient. Per nuc med staff, the protocol is pending from [facility] and they should have this soon. I have called and notified the patient of the possibility of alternative testing, but that we are waiting on a protocol for this.  He is aware I will call him back once I have further news on this.  He voices understanding.

## 2016-12-24 ENCOUNTER — Other Ambulatory Visit (INDEPENDENT_AMBULATORY_CARE_PROVIDER_SITE_OTHER): Payer: PPO

## 2016-12-24 DIAGNOSIS — N183 Chronic kidney disease, stage 3 unspecified: Secondary | ICD-10-CM

## 2016-12-24 DIAGNOSIS — G4733 Obstructive sleep apnea (adult) (pediatric): Secondary | ICD-10-CM

## 2016-12-24 DIAGNOSIS — I2510 Atherosclerotic heart disease of native coronary artery without angina pectoris: Secondary | ICD-10-CM

## 2016-12-24 DIAGNOSIS — I5041 Acute combined systolic (congestive) and diastolic (congestive) heart failure: Secondary | ICD-10-CM | POA: Diagnosis not present

## 2016-12-24 DIAGNOSIS — I482 Chronic atrial fibrillation, unspecified: Secondary | ICD-10-CM

## 2016-12-25 DIAGNOSIS — N2581 Secondary hyperparathyroidism of renal origin: Secondary | ICD-10-CM | POA: Diagnosis not present

## 2016-12-25 DIAGNOSIS — Z6833 Body mass index (BMI) 33.0-33.9, adult: Secondary | ICD-10-CM | POA: Diagnosis not present

## 2016-12-25 DIAGNOSIS — I428 Other cardiomyopathies: Secondary | ICD-10-CM | POA: Diagnosis not present

## 2016-12-25 DIAGNOSIS — N183 Chronic kidney disease, stage 3 (moderate): Secondary | ICD-10-CM | POA: Diagnosis not present

## 2016-12-25 DIAGNOSIS — E785 Hyperlipidemia, unspecified: Secondary | ICD-10-CM | POA: Diagnosis not present

## 2016-12-25 DIAGNOSIS — I2510 Atherosclerotic heart disease of native coronary artery without angina pectoris: Secondary | ICD-10-CM | POA: Diagnosis not present

## 2016-12-25 DIAGNOSIS — I4891 Unspecified atrial fibrillation: Secondary | ICD-10-CM | POA: Diagnosis not present

## 2016-12-25 DIAGNOSIS — Z955 Presence of coronary angioplasty implant and graft: Secondary | ICD-10-CM | POA: Diagnosis not present

## 2016-12-25 DIAGNOSIS — E1129 Type 2 diabetes mellitus with other diabetic kidney complication: Secondary | ICD-10-CM | POA: Diagnosis not present

## 2016-12-25 DIAGNOSIS — I129 Hypertensive chronic kidney disease with stage 1 through stage 4 chronic kidney disease, or unspecified chronic kidney disease: Secondary | ICD-10-CM | POA: Diagnosis not present

## 2016-12-25 LAB — HEPATIC FUNCTION PANEL
ALT: 17 IU/L (ref 0–44)
AST: 23 IU/L (ref 0–40)
Albumin: 3.7 g/dL (ref 3.6–4.8)
Alkaline Phosphatase: 116 IU/L (ref 39–117)
Bilirubin Total: 0.8 mg/dL (ref 0.0–1.2)
Bilirubin, Direct: 0.22 mg/dL (ref 0.00–0.40)
Total Protein: 6.5 g/dL (ref 6.0–8.5)

## 2016-12-25 LAB — LIPID PANEL
Chol/HDL Ratio: 3.1 ratio (ref 0.0–5.0)
Cholesterol, Total: 108 mg/dL (ref 100–199)
HDL: 35 mg/dL — ABNORMAL LOW (ref >39)
LDL Calculated: 55 mg/dL (ref 0–99)
Triglycerides: 89 mg/dL (ref 0–149)
VLDL Cholesterol Cal: 18 mg/dL (ref 5–40)

## 2016-12-29 ENCOUNTER — Other Ambulatory Visit: Payer: Self-pay | Admitting: Cardiology

## 2016-12-30 ENCOUNTER — Other Ambulatory Visit: Payer: Self-pay | Admitting: *Deleted

## 2016-12-30 MED ORDER — PAROXETINE HCL 40 MG PO TABS: 40.0000 mg | ORAL_TABLET | Freq: Every morning | ORAL | 3 refills | Status: DC

## 2017-01-02 ENCOUNTER — Telehealth: Payer: Self-pay | Admitting: Cardiology

## 2017-01-02 DIAGNOSIS — N183 Chronic kidney disease, stage 3 (moderate): Secondary | ICD-10-CM | POA: Diagnosis not present

## 2017-01-02 NOTE — Telephone Encounter (Signed)
New message    Pt c/o medication issue:  1. Name of Medication: gabapentin (NEURONTIN) 300 MG capsule / potassium chloride SA (K-DUR,KLOR-CON) 20 MEQ tablet  2. How are you currently taking this medication (dosage and times per day)? TAKE ONE CAPSULE BY MOUTH AT BEDTIME Patient taking differently: TAKE 300 MG BY MOUTH AT BEDTIME / Take 1 tablet (20 mEq total) by mouth 2 (two) times daily. Patient taking differently: Take 20 mEq by mouth daily.   3. Are you having a reaction (difficulty breathing--STAT)? No   4. What is your medication issue? Sweating

## 2017-01-02 NOTE — Telephone Encounter (Signed)
Patient calling and states that he has had excessive sweating for the past 2 weeks. Patient states that his knees and ankles are a little weaker than usual. Patient denies having any chest pain, SOB, palpitations, or any other symptoms. Patient states that he has not been eating well lately. Patient states that it has been about a week since he checked his blood sugar. Advised that that the patient check his blood sugar. He states that he knows that he doesn't check it like he is suppose to be he will get around to it. Advised for patient to be seen by his PCP and made him aware that the information would be forwarded to Dr. Meda Coffee for review. Patient verbalized understanding and thanked me for the call.

## 2017-01-03 ENCOUNTER — Telehealth: Payer: Self-pay | Admitting: Internal Medicine

## 2017-01-03 NOTE — Telephone Encounter (Signed)
Patient wants to talk to pam .

## 2017-01-03 NOTE — Telephone Encounter (Signed)
Spoke with patient and he states that he is just waiting to hear back about a different test. Let him know that I would send this over to Dr. Olin Pia nurse and that I think they are working on this. He was appreciative for the call back and had no further questions at this time.

## 2017-01-03 NOTE — Telephone Encounter (Signed)
See other telephone notes.  

## 2017-01-03 NOTE — Telephone Encounter (Signed)
Left voicemail message to call back  

## 2017-01-03 NOTE — Telephone Encounter (Signed)
Patient refused Cardiac MRI unable to perform due to size of machine.

## 2017-01-07 ENCOUNTER — Telehealth: Payer: Self-pay | Admitting: Pulmonary Disease

## 2017-01-07 NOTE — Telephone Encounter (Signed)
Pt would like lab results.  

## 2017-01-16 ENCOUNTER — Telehealth: Payer: Self-pay | Admitting: *Deleted

## 2017-01-16 MED ORDER — LOSARTAN POTASSIUM 100 MG PO TABS: 100.0000 mg | ORAL_TABLET | Freq: Every day | ORAL | 1 refills | Status: DC

## 2017-01-16 NOTE — Telephone Encounter (Signed)
Update from Dr. Caryl Comes- nuc med protocol will not be ready for our office for about another 6 months.  He has discussed this with Dr. Meda Coffee and she recommends that the patient have a nuclear test done at [facility] (NM wbc scan tumor).   I called the patient and notified him of this and he is agreeable.  I advised I would call back with instructions and a date/ time.   Order entered- spoke with nuc med scheduling- order had to be re-entered as [id number] (NM Tumor localization with spect) for amyloid.  Patient scheduled for: Wed 01/22/17 Arrive at [facility] radiology dept- 1st floor- at 11:45 am.  No special instructions per nuc med scheduling. Sent to pre-cert.

## 2017-01-16 NOTE — Telephone Encounter (Signed)
Refill sent.

## 2017-01-16 NOTE — Telephone Encounter (Signed)
Need refill on losartan, 90 day supply.  Derl Barrow, RN

## 2017-01-17 ENCOUNTER — Ambulatory Visit (INDEPENDENT_AMBULATORY_CARE_PROVIDER_SITE_OTHER): Payer: PPO | Admitting: Family Medicine

## 2017-01-17 ENCOUNTER — Encounter: Payer: Self-pay | Admitting: Family Medicine

## 2017-01-17 VITALS — BP 140/82 | HR 106 | Temp 98.7°F | Ht 73.0 in | Wt 237.6 lb

## 2017-01-17 DIAGNOSIS — E119 Type 2 diabetes mellitus without complications: Secondary | ICD-10-CM

## 2017-01-17 DIAGNOSIS — Z794 Long term (current) use of insulin: Secondary | ICD-10-CM | POA: Diagnosis not present

## 2017-01-17 DIAGNOSIS — Z1211 Encounter for screening for malignant neoplasm of colon: Secondary | ICD-10-CM | POA: Diagnosis not present

## 2017-01-17 DIAGNOSIS — IMO0001 Reserved for inherently not codable concepts without codable children: Secondary | ICD-10-CM

## 2017-01-17 NOTE — Progress Notes (Signed)
   CC: diabetes  HPI DM - current plan is novolin 70/30 60 units just once per day. Denies lows. Randomly checks CBG at home, 125 was the last number. Sometimes gets low at night - wakes up with shakes. Twice per month or so. Mostly has snacks before bedtime, notices lows if he doesn't snack.   He wants to update me on his ongoing cardiology evaluation, which I can also see in Epic. He thinks he might apply for disability in full, rather than partial disability.   ROS: denies CP, endorses unchanged SOB, denies abd pain, denies painful extremities, dysuria, or changes in BMs.   CC, SH/smoking status, and VS noted  Objective: BP 140/82   Pulse (!) 106   Temp 98.7 F (37.1 C) (Oral)   Ht 6\' 1"  (1.854 m)   Wt 237 lb 9.6 oz (107.8 kg)   SpO2 97%   BMI 31.35 kg/m  Gen: NAD, alert, cooperative, and pleasant obese male.  HEENT: NCAT, EOMI, PERRL CV: irregular rhythym, mildly tachycardic, no murmur Resp: CTAB, no wheezes, non-labored Abd: SNTND, BS present, no guarding or organomegaly Ext: No edema, warm Neuro: Alert and oriented, Speech clear, No gross deficits Diabetic Foot Exam - Simple   Simple Foot Form Diabetic Foot exam was performed with the following findings:  Yes 01/17/2017  3:38 PM  Visual Inspection No deformities, no ulcerations, no other skin breakdown bilaterally:  Yes Sensation Testing Intact to touch and monofilament testing bilaterally:  Yes Pulse Check Posterior Tibialis and Dorsalis pulse intact bilaterally:  Yes Comments Oncomycosis diffusely. S/p amputation of L 2nd toe.      Assessment and plan:  IDDM (insulin dependent diabetes mellitus) (HCC) Decrease Novolin to 55U daily. Expect the patient is having some insulin potentiation due to CKD. Counseled to take CBG and snack if low. Recheck Hgba1c in 1 month.   Orders Placed This Encounter  Procedures  . Fecal occult blood, imunochemical    Standing Status:   Future    Standing Expiration Date:    01/17/2018    No orders of the defined types were placed in this encounter.  Health Maintenance reviewed - needs Hep C screen with next labs.  Ralene Ok, MD, PGY2 01/17/2017 3:39 PM

## 2017-01-17 NOTE — Assessment & Plan Note (Signed)
Decrease Novolin to 55U daily. Expect the patient is having some insulin potentiation due to CKD. Counseled to take CBG and snack if low. Recheck Hgba1c in 1 month.

## 2017-01-17 NOTE — Patient Instructions (Signed)
It was a pleasure to see you today! Thank you for choosing [facility] for your primary care. Anthony [name] was seen for diabetes.   Our plans for today were:  DECREASE your Novolin to 55U instead of 60units.   Check your CBG and eat if you feel dizzy or shaky.  You should return to our clinic to see Dr. Lindell Noe in 1 month for Diabetes.   Best,  Dr. Lindell Noe

## 2017-01-21 ENCOUNTER — Telehealth: Payer: Self-pay | Admitting: *Deleted

## 2017-01-21 DIAGNOSIS — Z1211 Encounter for screening for malignant neoplasm of colon: Secondary | ICD-10-CM | POA: Diagnosis not present

## 2017-01-21 NOTE — Addendum Note (Signed)
Addended by: [place] Pink on: 01/21/2017 02:56 PM   Modules accepted: Orders

## 2017-01-22 ENCOUNTER — Encounter: Payer: Self-pay | Admitting: Family Medicine

## 2017-01-22 ENCOUNTER — Encounter (HOSPITAL_COMMUNITY): Payer: PPO

## 2017-01-22 DIAGNOSIS — E1122 Type 2 diabetes mellitus with diabetic chronic kidney disease: Secondary | ICD-10-CM | POA: Insufficient documentation

## 2017-01-22 DIAGNOSIS — N184 Chronic kidney disease, stage 4 (severe): Secondary | ICD-10-CM | POA: Insufficient documentation

## 2017-01-22 DIAGNOSIS — N183 Chronic kidney disease, stage 3 (moderate): Secondary | ICD-10-CM

## 2017-01-22 DIAGNOSIS — N2581 Secondary hyperparathyroidism of renal origin: Secondary | ICD-10-CM | POA: Insufficient documentation

## 2017-01-27 LAB — FECAL OCCULT BLOOD, IMMUNOCHEMICAL: Fecal Occult Bld: NEGATIVE

## 2017-01-30 ENCOUNTER — Telehealth: Payer: Self-pay | Admitting: Internal Medicine

## 2017-01-30 NOTE — Telephone Encounter (Signed)
I spoke with the patient- I advised him I was just finding out this afternoon that his procedure had not been approved for tomorrow.  He is aware I am in touch with our pre-cert coordinator, Caryl Pina, and will get back with him when I know something further. Per Caryl Pina, this has gone to an MD for the patient's insurance.

## 2017-01-30 NOTE — Telephone Encounter (Signed)
Pt is calling regarding the approval on the "procedure" he needs to have done. Please call.

## 2017-01-31 ENCOUNTER — Encounter (HOSPITAL_COMMUNITY): Payer: PPO

## 2017-02-12 ENCOUNTER — Ambulatory Visit (INDEPENDENT_AMBULATORY_CARE_PROVIDER_SITE_OTHER): Payer: PPO | Admitting: Family Medicine

## 2017-02-12 VITALS — BP 140/80 | HR 74 | Temp 98.1°F | Wt 237.8 lb

## 2017-02-12 DIAGNOSIS — Z1159 Encounter for screening for other viral diseases: Secondary | ICD-10-CM

## 2017-02-12 DIAGNOSIS — I482 Chronic atrial fibrillation, unspecified: Secondary | ICD-10-CM

## 2017-02-12 DIAGNOSIS — IMO0001 Reserved for inherently not codable concepts without codable children: Secondary | ICD-10-CM

## 2017-02-12 DIAGNOSIS — E119 Type 2 diabetes mellitus without complications: Secondary | ICD-10-CM | POA: Diagnosis not present

## 2017-02-12 DIAGNOSIS — N183 Chronic kidney disease, stage 3 unspecified: Secondary | ICD-10-CM

## 2017-02-12 DIAGNOSIS — Z794 Long term (current) use of insulin: Secondary | ICD-10-CM

## 2017-02-12 LAB — POCT GLYCOSYLATED HEMOGLOBIN (HGB A1C): Hemoglobin A1C: 5.9

## 2017-02-12 NOTE — Patient Instructions (Addendum)
It was a pleasure to see you today! Thank you for choosing [facility] for your primary care. Anthony [name] was seen for diabetes, tiredness.   Our plans for today were:  I will call you with your lab results. If your kidney function is the same, we need to stop your metformin.   Please call your cardiologist today to get an appointment to talk about the tiredness. They need to continue working on getting your heart rate down.   In the meantime, increase your metoprolol to 100mg  in the morning and 50mg  at night.  You should return to our clinic to see Dr. Eden Emms in 3 months for Diabetes.   Best,  Dr. Lindell Noe

## 2017-02-12 NOTE — Assessment & Plan Note (Signed)
Repeat a1c today is 5.9. While patient reports he is trying to eat fewer sweets, I anticipate that his CKD is potentiating his insulin. Has been using metformin as prescribed and Novolin 55U qam. Will recheck BMP today and if GFR continues to be close to 30 (33 last in July), I will call patient and he will discontinue metformin 2/2 risk of lactic acidosis. He understands this plan.

## 2017-02-12 NOTE — Assessment & Plan Note (Signed)
Has been following with EP Dr. Caryl Comes regarding difficult to control Afib, commonly in RVR at [place]. Dr. Caryl Comes c/w amyloidosis. Patient not able to tolerate cardiac MRI, fat pad biopsy negative for amyloid deposits. Patient reporting increased tiredness with exertion. Denys CP, endorses feeling palpitations with walking. HR 124 with just walking to exam room, repeat also 120s. Temporarily increased patients metoprolol to 100mg  qam and 50mg  qpm and requested that he call today to get the first available appt with Dr. Caryl Comes or another EP MD. Appears euvolemic on exam (trace edema, no weight gain, no crackles), making CHF exacerbation less likely.

## 2017-02-12 NOTE — Progress Notes (Signed)
   CC: DM  HPI DM - A1c now down to 5.9. Hasn't checked CBGs in a while, maybe 120s a couple of hours after eating. Has decreased sweet intake. Has been feeling great. Is using Novolin 55U at home. Denies feelings of lows.   CHF - has been feeling tired. Has not gotten his cardiac MRI. Feels increased HR when tired. When carrying groceries inside he gets weak and tired and feels like he needs to sit down. Denies CP with this, endorses SOB. No change in weight from last visit.   ROS: denies CP, endorses SOB as above, denies abd pain, denies swelling or weight gain, denies rash, denies changes in BMs. Denies nausea or dizziness.    CC, SH/smoking status, and VS noted  Objective: BP 140/80 (BP Location: Right Arm, Patient Position: Sitting, Cuff Size: Large)   Pulse 74   Temp 98.1 F (36.7 C) (Oral)   Wt 237 lb 12.8 oz (107.9 kg)   SpO2 98%   BMI 31.37 kg/m  Gen: NAD, alert, cooperative, and pleasant obese male.  HEENT: NCAT, EOMI, PERRL CV: rapid irregular rhythm, no murmur Resp: CTAB, no wheezes, non-labored Abd: SNTND, BS present, no guarding or organomegaly Ext: Trace pretibial edema, warm Neuro: Alert and oriented, Speech clear, No gross deficits  Assessment and plan:  IDDM (insulin dependent diabetes mellitus) (HCC) Repeat a1c today is 5.9. While patient reports he is trying to eat fewer sweets, I anticipate that his CKD is potentiating his insulin. Has been using metformin as prescribed and Novolin 55U qam. Will recheck BMP today and if GFR continues to be close to 30 (33 last in July), I will call patient and he will discontinue metformin 2/2 risk of lactic acidosis. He understands this plan.   Chronic atrial fibrillation (HCC) Has been following with EP Dr. Caryl Comes regarding difficult to control Afib, commonly in RVR at [facility]. Dr. Caryl Comes c/w amyloidosis. Patient not able to tolerate cardiac MRI, fat pad biopsy negative for amyloid deposits. Patient reporting increased tiredness  with exertion. Denys CP, endorses feeling palpitations with walking. HR 124 with just walking to exam room, repeat also 120s. Temporarily increased patients metoprolol to 150m qam and 556mqpm and requested that he call today to get the first available appt with Dr. KlCaryl Comesr another EP MD. Appears euvolemic on exam (trace edema, no weight gain, no crackles), making CHF exacerbation less likely.    Orders Placed This Encounter  Procedures  . Basic metabolic panel  . Hepatitis C antibody  . HgB A1c    No orders of the defined types were placed in this encounter.  Health Maintenance reviewed - hep C screening added to today's labs.  KaRalene OkMD, PGY2 02/12/2017 4:05 PM

## 2017-02-13 ENCOUNTER — Encounter: Payer: Self-pay | Admitting: Cardiology

## 2017-02-13 ENCOUNTER — Ambulatory Visit (INDEPENDENT_AMBULATORY_CARE_PROVIDER_SITE_OTHER): Payer: PPO | Admitting: Cardiology

## 2017-02-13 ENCOUNTER — Ambulatory Visit: Payer: PPO | Admitting: Family Medicine

## 2017-02-13 VITALS — BP 122/80 | HR 132 | Ht 73.0 in | Wt 238.4 lb

## 2017-02-13 DIAGNOSIS — I482 Chronic atrial fibrillation, unspecified: Secondary | ICD-10-CM

## 2017-02-13 DIAGNOSIS — E859 Amyloidosis, unspecified: Secondary | ICD-10-CM

## 2017-02-13 DIAGNOSIS — I5041 Acute combined systolic (congestive) and diastolic (congestive) heart failure: Secondary | ICD-10-CM

## 2017-02-13 DIAGNOSIS — I428 Other cardiomyopathies: Secondary | ICD-10-CM

## 2017-02-13 DIAGNOSIS — I2510 Atherosclerotic heart disease of native coronary artery without angina pectoris: Secondary | ICD-10-CM

## 2017-02-13 DIAGNOSIS — I10 Essential (primary) hypertension: Secondary | ICD-10-CM | POA: Diagnosis not present

## 2017-02-13 MED ORDER — METOPROLOL SUCCINATE ER 50 MG PO TB24: ORAL_TABLET | ORAL | 1 refills | Status: DC

## 2017-02-13 NOTE — Progress Notes (Signed)
Cardiology Office Note   Date:  02/13/2017   ID:  Gay, Peeters May 09, 1951, MRN [id number]  PCP:  Sela Hilding, MD  Cardiologist:  Dr. Meda Coffee EP Dr. Caryl Comes     Chief Complaint  Patient presents with  . Tachycardia      History of Present Illness: Anthony [name] is a 66 y.o. male who presents for tachycardia in his permanent atrial fib.   Has permanent atrial fibrillation with efforts to control rate incl BB and diltiazem    Anticoagulated with apixoban DX in 2015.    He has a history of coronary artery disease with prior stenting of the circumflex in 2003.  He is treated sleep apnea with incomplete compliance   Pt had noted only mild shortness of breath over the last 6-12 months without appreciable change. Immediate noted more change over the last couple of weeks since institution of diltiazem. The edema is better. He has not had nocturnal dyspnea orthopnea. He has not had palpitations or syncope.  Echo revealed decreased EF wit question if related to the a fib or other issue.  He has MRI with cardiac morphology ordered but waiting insurance approval.    He has some orthostatic lightheadedness particularly with bending at the end of the day  Per Dr. Caryl Comes "To explore amyloid, we will undertake an MRI with gadolinium. We will need to check with Dr. Lorrene Reid as to whether GFR is sufficient to allow this to be done safely . We will also pursue fat pad biopsy. Given the GFR issue, we will plan to do the fat pad biopsy first and the MRI if nondiagnostic   I will have to do some more reading but my impression is electrophoresis is not so valuable in transthyretin amyloid.    He will likely need AV junction ablation and device implantation. However, his symptoms are not so dramatic and so I have told him that while these issues are concerning they are not so emergent as was suggested by the dramatic nature which this appointment was scheduled"  His dilt was stopped and so  was coreg.  toprol XL 50 mg BID added.  Now on toprol HR is 133.  In his PCP HR was 124.  Pt not aware of HR.  He denies SOB or edema.  No chest pain.  He takes his eliquis and has not missed any doses.   Past Medical History:  Diagnosis Date  . Arthritis   . CAD in native artery    a. reported MI 2000, 2001, s/p stenting of the circumflex lesion extending into the second obtuse marginal branch with a drug-eluting stent in 2003. b. low risk nuc 2015.  Marland Kitchen Chronic atrial fibrillation ([place])   . Chronic combined systolic and diastolic CHF (congestive heart failure) ([place])    a. Previously diastolic, then EF [id number] in 10/2016.  . CKD (chronic kidney disease), stage III   . Depression   . Diabetes mellitus   . Edema of extremities   . Erectile dysfunction   . GERD (gastroesophageal reflux disease)   . Hyperlipidemia   . Hypertension   . Myocardial infarction ([place]) 2000&2001  . Obesity   . Onychomycosis   . OSA (obstructive sleep apnea)   . Tubular adenoma of colon 10/2002    Past Surgical History:  Procedure Laterality Date  . ANGIOPLASTY  2001   stent x 1  . COLONOSCOPY  2000?   negative  . TOE AMPUTATION Left 2012  Current Outpatient Prescriptions  Medication Sig Dispense Refill  . apixaban (ELIQUIS) 5 MG TABS tablet Take 1 tablet by mouth twice a day 60 tablet 5  . calcitRIOL (ROCALTROL) 0.25 MCG capsule TAKE 0.25 MG BY MOUTH EVERY MON WED AND FRIDAY  5  . furosemide (LASIX) 80 MG tablet Take 1 tablet (80 mg total) by mouth 2 (two) times daily. 180 tablet 3  . gabapentin (NEURONTIN) 300 MG capsule TAKE ONE CAPSULE BY MOUTH AT BEDTIME 90 capsule 0  . insulin NPH-regular Human (NOVOLIN 70/30) (70-30) 100 UNIT/ML injection Inject 55 Units into the skin daily at 12 noon.    . isosorbide mononitrate (IMDUR) 60 MG 24 hr tablet TAKE 1 TABLET BY MOUTH DAILY 90 tablet 3  . losartan (COZAAR) 100 MG tablet Take 1 tablet (100 mg total) by mouth daily. 90 tablet 1  . metFORMIN (GLUCOPHAGE)  1000 MG tablet TAKE 1 TABLET BY MOUTH TWICE DAILY WITH A MEAL 120 tablet 2  . metoprolol succinate (TOPROL-XL) 50 MG 24 hr tablet Take 1.5 tablets (26m) by mouth twice daily. Take with or immediately following a meal. 270 tablet 1  . Multiple Vitamin (MULTIVITAMIN) tablet Take 1 tablet by mouth daily.      . nicotine (NICODERM CQ - DOSED IN MG/24 HR) 7 mg/24hr patch Place 1 patch (7 mg total) onto the skin daily. 28 patch 0  . nitroGLYCERIN (NITROSTAT) 0.4 MG SL tablet Place 0.4 mg under the tongue every 5 (five) minutes as needed for chest pain. If no relief by 3rd tab, call 911     . Omega 3 1000 MG CAPS Take 1,000 mg by mouth 3 (three) times daily.     .Marland Kitchenoxymorphone (OPANA) 5 MG tablet Take 5 mg by mouth every 6 (six) hours as needed for pain (max 3 tablets daily). Per Dr BBrien Few   . PARoxetine (PAXIL) 40 MG tablet Take 1 tablet (40 mg total) by mouth every morning. 90 tablet 3  . potassium chloride SA (K-DUR,KLOR-CON) 20 MEQ tablet Take 1 tablet (20 mEq total) by mouth 2 (two) times daily. 180 tablet 3  . ranitidine (ZANTAC) 150 MG tablet Take 150 mg by mouth daily.    . rosuvastatin (CRESTOR) 5 MG tablet Take 1 tablet (5 mg total) by mouth daily. 30 tablet 11  . furosemide (LASIX) 80 MG tablet TAKE ONE TABLET BY MOUTH DAILY (Patient not taking: Reported on 02/13/2017) 90 tablet 2   No current facility-administered medications for this visit.     Allergies:   Enalapril maleate    Social History:  The patient  reports that he quit smoking about 8 months ago. His smoking use included Cigarettes. He started smoking about 49 years ago. He has a 11.25 pack-year smoking history. He has never used smokeless tobacco. He reports that he does not drink alcohol or use drugs.   Family History:  The patient's family history includes Heart attack in his father and mother.    ROS:  General:no colds or fevers, no weight changes Skin:no rashes or ulcers HEENT:no blurred vision, no congestion CV:see  HPI PUL:see HPI GI:no diarrhea constipation or melena, no indigestion GU:no hematuria, no dysuria MS:no joint pain, no claudication Neuro:no syncope, no lightheadedness Endo:+ diabetes, no thyroid disease  Wt Readings from Last 3 Encounters:  02/13/17 238 lb 6.4 oz (108.1 kg)  02/12/17 237 lb 12.8 oz (107.9 kg)  01/17/17 237 lb 9.6 oz (107.8 kg)     PHYSICAL EXAM: VS:  BP 122/80  Pulse (!) 132   Ht 6' 1"$  (1.854 m)   Wt 238 lb 6.4 oz (108.1 kg)   SpO2 97%   BMI 31.45 kg/m  , BMI Body mass index is 31.45 kg/m. General:Pleasant affect, NAD Skin:Warm and dry, brisk capillary refill HEENT:normocephalic, sclera clear, mucus membranes moist Neck:supple, no JVD, no bruits  Heart:irreg irreg without murmur, gallup, rub or click Lungs:clear without rales, rhonchi, or wheezes [id number], non tender, + BS, do not palpate liver spleen or masses Ext:1+ lower ext edema, 2+ pedal pulses, 2+ radial pulses Neuro:alert and oriented x 3, MAE, follows commands, + facial symmetry    EKG:  EKG is ordered today. The ekg ordered today demonstrates a fib with RVR at 133 no ST changes.     Recent Labs: 11/21/2016: NT-Pro BNP 930 11/26/2016: Magnesium 1.5; TSH 1.320 12/05/2016: Hemoglobin 13.5; Platelets 131 12/24/2016: ALT 17 02/12/2017: BUN WILL FOLLOW; Creatinine, Ser WILL FOLLOW; Potassium WILL FOLLOW; Sodium WILL FOLLOW    Lipid Panel    Component Value Date/Time   CHOL 108 12/24/2016 1313   TRIG 89 12/24/2016 1313   HDL 35 (L) 12/24/2016 1313   CHOLHDL 3.1 12/24/2016 1313   CHOLHDL 3.2 02/28/2016 0906   VLDL 19 02/28/2016 0906   LDLCALC 55 12/24/2016 1313   LDLDIRECT 58 11/06/2011 1434       Other studies Reviewed: Additional studies/ records that were reviewed today include: . Echo Study Conclusions  - Left ventricle: The cavity size was normal. There was severe   concentric hypertrophy. Systolic function was severely reduced.   The estimated ejection fraction was in the  range of 25% to 30%.   Diffuse hypokinesis. - Left atrium: The atrium was severely dilated. - Right ventricle: Systolic function was moderately reduced. - Right atrium: The atrium was moderately dilated. - Tricuspid valve: There was trivial regurgitation. - Pulmonary arteries: Systolic pressure was at the upper limits of   normal. - Inferior vena cava: The vessel was normal in size. - Pericardium, extracardiac: There was no pericardial effusion.  Impressions:  - When compared to the prior study from 02/23/2014, LVEF has   decreased from 55-60% to 25-30% with diffuse hypokinesis, RVEF is   moderately decreased. This is possibly tachycardia mediated, the   study was acquired while patient in atrial fibrillation with RVR.   ASSESSMENT AND PLAN:  1. Permanent a fib with RVR after recent medication changes, briefly discussed with Dr. [name] and will increase toprol XL to 75 BID.  Pt is asymptomatic.  Plan for possible ablation, has seen Dr. Caryl Comes and waiting for Cardiac MRI --on Eliquis has not missed any doses.  Will notify Dr. Caryl Comes and I will see pt back in 2 weeks to eval. Rate.  From Dr. Aquilla Hacker note some tachycardia is permissible.   2.  Cardiomyopathy awaiting MRI- cardiac currently asymptomatic.   3.  CAD with hx PCI to LCX. No chest pain   4.  HTN controlled   Current medicines are reviewed with the patient today.  The patient Has no concerns regarding medicines.  The following changes have been made:  See above Labs/ tests ordered today include:see above  Disposition:   FU:  see above  Signed, Cecilie Kicks, NP  02/13/2017 5:14 PM    [facility] [place], [address] [place], [place] Phone: [phone]; Fax: [phone]

## 2017-02-13 NOTE — Patient Instructions (Addendum)
Medication Instructions: Your physician has recommended you make the following change in your medication:  -1) INCREASE Metoprolol Succinate (Toprol XL) - Take 1.5 Tablets (75 mg) my mouth twice daily  Labwork: None Ordered  Procedures/Testing: None Ordered  Follow-Up: Your physician recommends that you schedule a follow-up appointment in: 2-3 WEEKS with Dayna Dunn PA-C, Brittainy Ladoris Gene, or Cecilie Kicks NP    If you need a refill on your cardiac medications before your next appointment, please call your pharmacy.

## 2017-02-14 LAB — BASIC METABOLIC PANEL
BUN/Creatinine Ratio: 7 — ABNORMAL LOW (ref 10–24)
BUN: 19 mg/dL (ref 8–27)
CO2: 21 mmol/L (ref 20–29)
Calcium: 9 mg/dL (ref 8.6–10.2)
Chloride: 102 mmol/L (ref 96–106)
Creatinine, Ser: 2.54 mg/dL — ABNORMAL HIGH (ref 0.76–1.27)
GFR calc Af Amer: 29 mL/min/{1.73_m2} — ABNORMAL LOW (ref >59)
GFR calc non Af Amer: 25 mL/min/{1.73_m2} — ABNORMAL LOW (ref >59)
Glucose: 54 mg/dL — ABNORMAL LOW (ref 65–99)
Potassium: 4.5 mmol/L (ref 3.5–5.2)
Sodium: 144 mmol/L (ref 134–144)

## 2017-02-14 LAB — HEPATITIS C ANTIBODY: Hep C Virus Ab: <0.1 s/co ratio (ref 0.0–0.9)

## 2017-02-17 ENCOUNTER — Other Ambulatory Visit: Payer: Self-pay | Admitting: Family Medicine

## 2017-02-17 ENCOUNTER — Other Ambulatory Visit: Payer: Self-pay | Admitting: *Deleted

## 2017-02-17 ENCOUNTER — Telehealth: Payer: Self-pay | Admitting: Family Medicine

## 2017-02-17 DIAGNOSIS — N183 Chronic kidney disease, stage 3 (moderate): Secondary | ICD-10-CM

## 2017-02-17 DIAGNOSIS — N179 Acute kidney failure, unspecified: Secondary | ICD-10-CM

## 2017-02-17 MED ORDER — GABAPENTIN 300 MG PO CAPS: 300.0000 mg | ORAL_CAPSULE | Freq: Every day | ORAL | 0 refills | Status: DC

## 2017-02-17 NOTE — Telephone Encounter (Signed)
Patient returning call from Dr. Lindell Noe. Patient advised to stop taking metformin due to worsening kidney function. Patient scheduled an appointment for Lab visit tomorrow to have repeat labs drawn.  Derl Barrow, RN

## 2017-02-17 NOTE — Progress Notes (Signed)
Future order for BMP.

## 2017-02-17 NOTE — Telephone Encounter (Signed)
Called patient to discuss lab results, he did not answer, left generic VM to call clinic back. He needs to STOP his metformin completely because of his worsening kidney function. He also needs to come in to have his kidneys rechecked today or tomorrow for a lab visit. I will place a future lab order in a separate orders only encounter.

## 2017-02-18 ENCOUNTER — Other Ambulatory Visit: Payer: PPO

## 2017-02-18 DIAGNOSIS — N179 Acute kidney failure, unspecified: Secondary | ICD-10-CM | POA: Diagnosis not present

## 2017-02-18 DIAGNOSIS — N183 Chronic kidney disease, stage 3 (moderate): Secondary | ICD-10-CM | POA: Diagnosis not present

## 2017-02-19 ENCOUNTER — Other Ambulatory Visit: Payer: Self-pay | Admitting: Cardiology

## 2017-02-19 LAB — BASIC METABOLIC PANEL
BUN/Creatinine Ratio: 6 — ABNORMAL LOW (ref 10–24)
BUN: 14 mg/dL (ref 8–27)
CO2: 23 mmol/L (ref 20–29)
Calcium: 9.1 mg/dL (ref 8.6–10.2)
Chloride: 104 mmol/L (ref 96–106)
Creatinine, Ser: 2.21 mg/dL — ABNORMAL HIGH (ref 0.76–1.27)
GFR calc Af Amer: 35 mL/min/{1.73_m2} — ABNORMAL LOW (ref >59)
GFR calc non Af Amer: 30 mL/min/{1.73_m2} — ABNORMAL LOW (ref >59)
Glucose: 121 mg/dL — ABNORMAL HIGH (ref 65–99)
Potassium: 4.5 mmol/L (ref 3.5–5.2)
Sodium: 144 mmol/L (ref 134–144)

## 2017-02-20 NOTE — Telephone Encounter (Signed)
I left a message for the patient to call- pre-certification has been received from his insurance for the tumor localization with spect to be done.

## 2017-02-21 NOTE — Telephone Encounter (Signed)
I called and spoke with the patient. I have rescheduled his NM tumor localization with spect for cardiac amyloidosis for Tuesday 03/04/17 at 12:00 pm. The patient is aware to arrive at [facility] on 11:30 am- 1st floor radiology. No special prep required.

## 2017-03-03 ENCOUNTER — Telehealth: Payer: Self-pay | Admitting: *Deleted

## 2017-03-03 ENCOUNTER — Encounter: Payer: Self-pay | Admitting: Cardiology

## 2017-03-03 ENCOUNTER — Ambulatory Visit (INDEPENDENT_AMBULATORY_CARE_PROVIDER_SITE_OTHER): Payer: PPO | Admitting: Cardiology

## 2017-03-03 VITALS — BP 140/82 | HR 81 | Ht 72.0 in | Wt 232.0 lb

## 2017-03-03 DIAGNOSIS — I428 Other cardiomyopathies: Secondary | ICD-10-CM

## 2017-03-03 DIAGNOSIS — I482 Chronic atrial fibrillation, unspecified: Secondary | ICD-10-CM

## 2017-03-03 DIAGNOSIS — I2510 Atherosclerotic heart disease of native coronary artery without angina pectoris: Secondary | ICD-10-CM | POA: Diagnosis not present

## 2017-03-03 NOTE — Progress Notes (Signed)
Cardiology Office Note   Date:  03/03/2017   ID:  Anthony [name], Anthony [name] Aug 28, 1950, MRN [id number]  PCP:  Sela Hilding, MD  Cardiologist:  Dr. Meda Coffee    Chief Complaint  Patient presents with  . Tachycardia      History of Present Illness: Anthony [name] is a 66 y.o. male who presents for permanent atrial fib and was tachycardic on last visit 02/13/17.    Anticoagulated with apixoban DX in 2015.    He has a history of coronary artery disease with prior stenting of the circumflex in 2003. He is treated sleep apnea with incomplete compliance   Pt had noted only mild shortness of breath over the last 6-12 months without appreciable change. Immediate noted more change over the last couple of weeks since institution of diltiazem. The edema is better. He has not had nocturnal dyspnea orthopnea. He has not had palpitations or syncope.  Echo revealed decreased EF wit question if related to the a fib or other issue.  He has MRI with cardiac morphology ordered but waiting insurance approval.    He has some orthostatic lightheadedness particularly with bending at the end of the day  Per Dr. Caryl Comes "To explore amyloid, we will undertake an MRI with gadolinium. We will need to check with Dr. Lorrene Reid as to whether GFR is sufficient to allow this to be done safely . We will also pursue fat pad biopsy. Given the GFR issue, we will plan to do the fat pad biopsy first and the MRI if nondiagnostic I will have to do some more reading but my impression is electrophoresis is not so valuable in transthyretin amyloid.   He will likely need AV junction ablation and device implantation. However, his symptoms are not so dramatic and so I have told him that while these issues are concerning they are not so emergent as was suggested by the dramatic nature which this appointment was scheduled"  His dilt was stopped and so was coreg.  toprol XL 50 mg BID added.  Now on toprol HR on last visit was 133.   In his PCP office HR was 124.  Pt not aware of HR.    We increased toprol to 75 BID.    Today he has no complaints.  No chest pain or SOB unless he walks fast then has to stop and rest to catch breath.  HR is in 80s today though it speeds up for brief periods.  He is for MRI to rule out amloid. That is scheduled for tomorrow.  .   Past Medical History:  Diagnosis Date  . Arthritis   . CAD in native artery    a. reported MI 2000, 2001, s/p stenting of the circumflex lesion extending into the second obtuse marginal branch with a drug-eluting stent in 2003. b. low risk nuc 2015.  Marland Kitchen Chronic atrial fibrillation ([place])   . Chronic combined systolic and diastolic CHF (congestive heart failure) ([place])    a. Previously diastolic, then EF 123XX123 in 10/2016.  . CKD (chronic kidney disease), stage III ([place])   . Depression   . Diabetes mellitus   . Edema of extremities   . Erectile dysfunction   . GERD (gastroesophageal reflux disease)   . Hyperlipidemia   . Hypertension   . Myocardial infarction ([place]) 2000&2001  . Obesity   . Onychomycosis   . OSA (obstructive sleep apnea)   . Tubular adenoma of colon 10/2002    Past Surgical History:  Procedure Laterality Date  . ANGIOPLASTY  2001   stent x 1  . COLONOSCOPY  2000?   negative  . TOE AMPUTATION Left 2012     Current Outpatient Prescriptions  Medication Sig Dispense Refill  . apixaban (ELIQUIS) 5 MG TABS tablet Take 1 tablet by mouth twice a day 60 tablet 5  . calcitRIOL (ROCALTROL) 0.25 MCG capsule TAKE 0.25 MG BY MOUTH EVERY MON WED AND FRIDAY  5  . furosemide (LASIX) 80 MG tablet Take 1 tablet (80 mg total) by mouth 2 (two) times daily. 180 tablet 3  . gabapentin (NEURONTIN) 300 MG capsule Take 1 capsule (300 mg total) by mouth at bedtime. 90 capsule 0  . insulin NPH-regular Human (NOVOLIN 70/30) (70-30) 100 UNIT/ML injection Inject 55 Units into the skin daily at 12 noon.    Marland Kitchen losartan (COZAAR) 100 MG tablet Take 1 tablet (100 mg  total) by mouth daily. 90 tablet 1  . metoprolol succinate (TOPROL-XL) 50 MG 24 hr tablet Take 1.5 tablets ('75mg'$ ) by mouth twice daily. Take with or immediately following a meal. 270 tablet 1  . Multiple Vitamin (MULTIVITAMIN) tablet Take 1 tablet by mouth daily.      . nitroGLYCERIN (NITROSTAT) 0.4 MG SL tablet Place 0.4 mg under the tongue every 5 (five) minutes as needed for chest pain. If no relief by 3rd tab, call 911     . Omega 3 1000 MG CAPS Take 1,000 mg by mouth 3 (three) times daily.     Marland Kitchen oxymorphone (OPANA) 5 MG tablet Take 5 mg by mouth every 6 (six) hours as needed for pain (max 3 tablets daily). Per Dr Brien Few    . PARoxetine (PAXIL) 40 MG tablet Take 1 tablet (40 mg total) by mouth every morning. 90 tablet 3  . potassium chloride SA (K-DUR,KLOR-CON) 20 MEQ tablet Take 1 tablet (20 mEq total) by mouth 2 (two) times daily. 180 tablet 3  . ranitidine (ZANTAC) 150 MG tablet Take 150 mg by mouth daily.    . rosuvastatin (CRESTOR) 5 MG tablet Take 1 tablet (5 mg total) by mouth daily. 30 tablet 11   No current facility-administered medications for this visit.     Allergies:   Enalapril maleate    Social History:  The patient  reports that he quit smoking about 8 months ago. His smoking use included Cigarettes. He started smoking about 49 years ago. He has a 11.25 pack-year smoking history. He has never used smokeless tobacco. He reports that he does not drink alcohol or use drugs.   Family History:  The patient's family history includes Heart attack in his father and mother.    ROS:  General:no colds or fevers, + weight loss Skin:no rashes or ulcers HEENT:no blurred vision, no congestion CV:see HPI PUL:see HPI GI:no diarrhea constipation or melena, no indigestion GU:no hematuria, no dysuria MS:no joint pain, no claudication Neuro:no syncope, no lightheadedness Endo:+ diabetes, no thyroid disease  Wt Readings from Last 3 Encounters:  03/03/17 232 lb (105.2 kg)  02/13/17  238 lb 6.4 oz (108.1 kg)  02/12/17 237 lb 12.8 oz (107.9 kg)     PHYSICAL EXAM: VS:  BP 140/82   Pulse 81   Ht 6' (1.829 m)   Wt 232 lb (105.2 kg)   SpO2 97%   BMI 31.46 kg/m  , BMI Body mass index is 31.46 kg/m. General:Pleasant affect, NAD Skin:Warm and dry, brisk capillary refill HEENT:normocephalic, sclera clear, mucus membranes moist Neck:supple, no JVD, no bruits  Heart:Irreg irreg without murmur, gallup, rub or click Lungs:clear without rales, rhonchi, or wheezes [id number], non tender, + BS, do not palpate liver spleen or masses Ext:no lower ext edema, 2+ pedal pulses, 2+ radial pulses Neuro:alert and oriented X 3, MAE, follows commands, + facial symmetry    EKG:  EKG is NOT ordered today.    Recent Labs: 11/21/2016: NT-Pro BNP 930 11/26/2016: Magnesium 1.5; TSH 1.320 12/05/2016: Hemoglobin 13.5; Platelets 131 12/24/2016: ALT 17 02/18/2017: BUN 14; Creatinine, Ser 2.21; Potassium 4.5; Sodium 144    Lipid Panel    Component Value Date/Time   CHOL 108 12/24/2016 1313   TRIG 89 12/24/2016 1313   HDL 35 (L) 12/24/2016 1313   CHOLHDL 3.1 12/24/2016 1313   CHOLHDL 3.2 02/28/2016 0906   VLDL 19 02/28/2016 0906   LDLCALC 55 12/24/2016 1313   LDLDIRECT 58 11/06/2011 1434       Other studies Reviewed: Additional studies/ records that were reviewed today include: . Echo 11/21/16 Study Conclusions  - Left ventricle: The cavity size was normal. There was severe   concentric hypertrophy. Systolic function was severely reduced.   The estimated ejection fraction was in the range of 25% to 30%.   Diffuse hypokinesis. - Left atrium: The atrium was severely dilated. - Right ventricle: Systolic function was moderately reduced. - Right atrium: The atrium was moderately dilated. - Tricuspid valve: There was trivial regurgitation. - Pulmonary arteries: Systolic pressure was at the upper limits of   normal. - Inferior vena cava: The vessel was normal in size. -  Pericardium, extracardiac: There was no pericardial effusion.  Impressions:  - When compared to the prior study from 02/23/2014, LVEF has   decreased from 55-60% to 25-30% with diffuse hypokinesis, RVEF is   moderately decreased. This is possibly tachycardia mediated, the   study was acquired while patient in atrial fibrillation with RVR.   ASSESSMENT AND PLAN:  1. Permanent a fib with RVR, improved HR with toprol XL at 75 BID.  Per Dr. Caryl Comes some tachycardia is permissible - may have ablation, depending on tests.  Will make him a follow up with me in 4 months though Dr. Caryl Comes may be following at that time.  2.  Cardiomyopathy awaiting MRI  3.  CAD with hx PCI to LCX no chest pain  4.  HTN controlled to slightly elevated.     Current medicines are reviewed with the patient today.  The patient Has no concerns regarding medicines.  The following changes have been made:  See above Labs/ tests ordered today include:see above  Disposition:   FU:  see above  Signed, Cecilie Kicks, NP  03/03/2017 3:18 PM    [address] [place], [place], [place], [place] Phone: [phone]; Fax: [phone]

## 2017-03-03 NOTE — Telephone Encounter (Signed)
Patient left message on nurse line stating the Eliquis cost $145. Please send something else in that is cheaper. If we have samples or discount card please let him know.  Derl Barrow, RN

## 2017-03-03 NOTE — Patient Instructions (Addendum)
Medication Instructions:   Your physician recommends that you continue on your current medications as directed. Please refer to the Current Medication list given to you today.   If you need a refill on your cardiac medications before your next appointment, please call your pharmacy.  Labwork: NONE ORDERED  TODAY    Testing/Procedures: NONE ORDERED  TODAY   Follow-Up:   AS SCHEDULED   Any Other Special Instructions Will Be Listed Below (If Applicable).

## 2017-03-04 ENCOUNTER — Encounter (HOSPITAL_COMMUNITY): Payer: PPO

## 2017-03-04 ENCOUNTER — Other Ambulatory Visit: Payer: Self-pay

## 2017-03-04 ENCOUNTER — Encounter (HOSPITAL_COMMUNITY)
Admission: RE | Admit: 2017-03-04 | Discharge: 2017-03-04 | Disposition: A | Discharge status: Home / Self Care | Payer: PPO | Source: Ambulatory Visit | Attending: Internal Medicine | Admitting: Internal Medicine

## 2017-03-04 ENCOUNTER — Encounter (HOSPITAL_COMMUNITY): Payer: Self-pay

## 2017-03-04 ENCOUNTER — Telehealth: Payer: Self-pay

## 2017-03-04 DIAGNOSIS — I517 Cardiomegaly: Secondary | ICD-10-CM | POA: Diagnosis not present

## 2017-03-04 MED ORDER — APIXABAN 5 MG PO TABS: ORAL_TABLET | ORAL | 3 refills | Status: DC

## 2017-03-04 MED ORDER — TECHNETIUM TC 99M PYROPHOSPHATE
21.0000 | Freq: Once | INTRAVENOUS | Status: DC
  Administered 2017-03-04: 21 via INTRAVENOUS

## 2017-03-04 NOTE — Telephone Encounter (Signed)
Please call him back and have him call cardiology [facility]). They manage his eliquis. He saw them yesterday and he may have accidentally called the wrong office. Their number is [phone]. Their pharmacist or RNs may be able to help.

## 2017-03-04 NOTE — Telephone Encounter (Addendum)
**Note De-Identified Jastin Fore [employer]bf[employer]ation** The pt states that his Eliquis has gone from $40 a month to $150 a month. I called his pharmacy and they confirmed that the pts has gone up to &150 a month but were unsure if the pt is in the donut hole. I then called [employer] and spoke with [facility] who did confirm that the pt is in the donut hole.  I called the pt back and spoke with him about Pt assistance through the [employer]. He is interested and is aware that I have left him Eliquis samples and an application to apply for pt assistance through [employer] in the front office and that he can pick up at his convenience. He verbalized understanding and thanked me for my assistance.  I have completed the provider part of the [employer] Pt Assistance application and placed it along with an Eliquis RX in Dr Thera Flake mail bin awaiting her signature.

## 2017-03-04 NOTE — Telephone Encounter (Signed)
Left message for patient to give his cardiologist a call regarding Eliquis.  Left cardiologist office number.  Derl Barrow, RN

## 2017-03-10 NOTE — Telephone Encounter (Signed)
**Note De-Identified Talah Cookston Obfuscation** Dr Meda Coffee has signed the [employer] application and RX for Eliquis. I have placed both in yellow folder in the PA Dept awaiting the pts part of the application.

## 2017-03-11 ENCOUNTER — Telehealth: Payer: Self-pay | Admitting: Cardiology

## 2017-03-11 NOTE — Telephone Encounter (Signed)
Anthony [name] is calling to find out if you have received a report from Dr. Jeani Hawking office . Please call

## 2017-03-11 NOTE — Telephone Encounter (Signed)
Yes Dr. Caryl Comes will have to give that.

## 2017-03-11 NOTE — Telephone Encounter (Signed)
Pt is calling for the results of the nuclear medicine scan done 03/04/17.  Pt advised results have not been reviewed by Dr Caryl Comes, I will forward to Dr Caryl Comes for review.

## 2017-03-12 ENCOUNTER — Telehealth: Payer: Self-pay

## 2017-03-12 NOTE — Telephone Encounter (Signed)
Patient assistance forms for Anthony [name] 5mg  faxed to [employer]. Advised patient it would be a few days to a week before we know if he is approved or not.

## 2017-03-14 ENCOUNTER — Telehealth: Payer: Self-pay | Admitting: Internal Medicine

## 2017-03-14 NOTE — Telephone Encounter (Signed)
°  Follow Up  Pt calling to follow up on most recent MRI results. Please call.

## 2017-03-14 NOTE — Telephone Encounter (Signed)
Pt informed Dr. Caryl Comes just returned to office after being gone since 10/3. Pt understands he will be reviewing test soon and we will let him know result once reviewed by physician.  He understands it may be next week before he hears from our office w/ result.

## 2017-03-19 ENCOUNTER — Telehealth: Payer: Self-pay | Admitting: Cardiology

## 2017-03-19 NOTE — Telephone Encounter (Signed)
New message    Pt is calling asking for a call back. He said he's calling about results from a month ago he hasnt heard anything about.

## 2017-03-19 NOTE — Telephone Encounter (Signed)
Pt is calling to follow-up on his test he had ordered and done by Dr Caryl Comes. Pt states he's had an MRI done but has yet to hear the result from each image ordered.  Pt was calling to ask if Dr Caryl Comes could review his test from 03/04/17  and have someone call him back with these results.  Noted test ordered by Dr Caryl Comes on this pt:  1) NM Tumor Localization W spect--this was on 03/04/17  Informed the pt that Dr Caryl Comes is out of the office today,but I will route this message to him for further review and interpretation of test result requested.  Will also route this message to Dr Meda Coffee, to possibly help in facilitating image result requested.

## 2017-03-20 NOTE — Telephone Encounter (Signed)
His study is suggestive of amyloidosis (mildly positive) but not conclusive, I would schedule a cardiac MRI that is the next step in evaluation.

## 2017-04-01 DIAGNOSIS — E113393 Type 2 diabetes mellitus with moderate nonproliferative diabetic retinopathy without macular edema, bilateral: Secondary | ICD-10-CM | POA: Diagnosis not present

## 2017-04-07 NOTE — Progress Notes (Signed)
Cardiology Office Note   Date:  04/08/2017   ID:  Anthony [name], Anthony [name] 05-Aug-1950, MRN [id number]  PCP:  Sela Hilding, MD  Cardiologist:  Dr. Meda Coffee    EP Dr. Caryl Comes   Chief Complaint  Patient presents with  . Atrial Fibrillation    atrial fib       History of Present Illness: Anthony [name] is a 66 y.o. male who presents for permanent atrial fib.     Anticoagulated with apixoban DX in 2015.   He has a history of coronary artery disease with prior stenting of the circumflex in 2003. He is treated sleep apnea with incomplete compliance   Pt had noted only mild shortness of breath over the last 6-12 months without appreciable change. Immediate noted more change over the last couple of weeks since institution of diltiazem. The edema is better. He has not had nocturnal dyspnea orthopnea. He has not had palpitations or syncope. Echo revealed decreased EF wit question if related to the a fib or other issue. He has MRI with cardiac morphology ordered but waiting insurance approval.   He has some orthostatic lightheadedness particularly with bending at the end of the day  Per Dr. Caryl Comes "To explore amyloid, we will undertake an MRI with gadolinium. We will need to check with Dr. Lorrene Reid as to whether GFR is sufficient to allow this to be done safely . We will also pursue fat pad biopsy. Given the GFR issue, we will plan to do the fat pad biopsy first and the MRI if nondiagnostic I will have to do some more reading but my impression is electrophoresis is not so valuable in transthyretin amyloid.  He will likely need AV junction ablation and device implantation. However, his symptoms are not so dramatic and so I have told him that while these issues are concerning they are not so emergent as was suggested by the dramatic nature which this appointment was scheduled"  His dilt was stopped and so was coreg. toprol XL 50 mg BID added. Now on toprol HR on last visit was 133.  In his PCP office HR was 124. Pt not aware of HR.   We increased toprol to 75 BID.    Fat biopsy was negative for amyloid    NUCLEAR MEDICINE TUMOR LOCALIZATION. PYP CARDIAC AMYLOIDOSIS SCAN WITH SPECT IMPRESSION: 1. Visual and quantitative assessment (grade 1, H/CLL equal 1.29) are equivocal for TTR amyloidosis.   MRI cardiac is to be scheduled.    Today he feels much better, HR is 95, no SOB or edema.  No chest pain.  He on occ has to take a deep breath for no reason that he understands. But otherwise he is better.  No bleeding on the Eliquis.   Past Medical History:  Diagnosis Date  . Arthritis   . CAD in native artery    a. reported MI 2000, 2001, s/p stenting of the circumflex lesion extending into the second obtuse marginal branch with a drug-eluting stent in 2003. b. low risk nuc 2015.  Marland Kitchen Chronic atrial fibrillation ([place])   . Chronic combined systolic and diastolic CHF (congestive heart failure) ([place])    a. Previously diastolic, then EF 123XX123 in 10/2016.  . CKD (chronic kidney disease), stage III ([place])   . Depression   . Diabetes mellitus   . Edema of extremities   . Erectile dysfunction   . GERD (gastroesophageal reflux disease)   . Hyperlipidemia   . Hypertension   . Myocardial  infarction ([place]) 2000&2001  . Obesity   . Onychomycosis   . OSA (obstructive sleep apnea)   . Tubular adenoma of colon 10/2002    Past Surgical History:  Procedure Laterality Date  . ANGIOPLASTY  2001   stent x 1  . COLONOSCOPY  2000?   negative  . TOE AMPUTATION Left 2012     Current Outpatient Medications  Medication Sig Dispense Refill  . apixaban (ELIQUIS) 5 MG TABS tablet Take 1 tablet by mouth twice a day 180 tablet 3  . calcitRIOL (ROCALTROL) 0.25 MCG capsule TAKE 0.25 MG BY MOUTH EVERY MON WED AND FRIDAY  5  . furosemide (LASIX) 80 MG tablet Take 1 tablet (80 mg total) by mouth 2 (two) times daily. 180 tablet 3  . gabapentin (NEURONTIN) 300 MG capsule Take 1 capsule  (300 mg total) by mouth at bedtime. 90 capsule 0  . insulin NPH-regular Human (NOVOLIN 70/30) (70-30) 100 UNIT/ML injection Inject 55 Units into the skin daily at 12 noon.    Marland Kitchen losartan (COZAAR) 100 MG tablet Take 1 tablet (100 mg total) by mouth daily. 90 tablet 1  . metoprolol succinate (TOPROL-XL) 50 MG 24 hr tablet Take 1.5 tablets ('75mg'$ ) by mouth twice daily. Take with or immediately following a meal. 270 tablet 1  . Multiple Vitamin (MULTIVITAMIN) tablet Take 1 tablet by mouth daily.      . nitroGLYCERIN (NITROSTAT) 0.4 MG SL tablet Place 0.4 mg under the tongue every 5 (five) minutes as needed for chest pain. If no relief by 3rd tab, call 911     . Omega 3 1000 MG CAPS Take 1,000 mg by mouth 3 (three) times daily.     Marland Kitchen oxymorphone (OPANA) 5 MG tablet Take 5 mg by mouth every 6 (six) hours as needed for pain (max 3 tablets daily). Per Dr Brien Few    . PARoxetine (PAXIL) 40 MG tablet Take 1 tablet (40 mg total) by mouth every morning. 90 tablet 3  . potassium chloride SA (K-DUR,KLOR-CON) 20 MEQ tablet Take 1 tablet (20 mEq total) by mouth 2 (two) times daily. 180 tablet 3  . ranitidine (ZANTAC) 150 MG tablet Take 150 mg by mouth daily.    . rosuvastatin (CRESTOR) 5 MG tablet Take 1 tablet (5 mg total) by mouth daily. 30 tablet 11   No current facility-administered medications for this visit.     Allergies:   Enalapril maleate    Social History:  The patient  reports that he quit smoking about 9 months ago. His smoking use included cigarettes. He started smoking about 49 years ago. He has a 11.25 pack-year smoking history. he has never used smokeless tobacco. He reports that he does not drink alcohol or use drugs.   Family History:  The patient's family history includes Heart attack in his father and mother.    ROS:  General:no colds or fevers, no weight changes Skin:no rashes or ulcers HEENT:no blurred vision, no congestion CV:see HPI PUL:see HPI GI:no diarrhea constipation or  melena, no indigestion GU:no hematuria, no dysuria MS:no joint pain, no claudication Neuro:no syncope, no lightheadedness Endo:+ diabetes but stable on insulin, no thyroid disease  Wt Readings from Last 3 Encounters:  04/08/17 235 lb (106.6 kg)  03/03/17 232 lb (105.2 kg)  02/13/17 238 lb 6.4 oz (108.1 kg)     PHYSICAL EXAM: VS:  BP (!) 160/90   Pulse 67   Resp 16   Ht '6\' 1"'$  (1.854 m)   Wt 235 lb (  106.6 kg)   SpO2 95%   BMI 31.00 kg/m  , BMI Body mass index is 31 kg/m. General:Pleasant affect, NAD Skin:Warm and dry, brisk capillary refill HEENT:normocephalic, sclera clear, mucus membranes moist Neck:supple, no JVD, no bruits  Heart:irreg irreg without murmur, gallup, rub or click Lungs:clear without rales, rhonchi, or wheezes [id number], non tender, + BS, do not palpate liver spleen or masses Ext:no lower ext edema, 2+ pedal pulses, 2+ radial pulses Neuro:alert and oriented, MAE, follows commands, + facial symmetry  Recheck BP was 142/92  EKG:  EKG is NOT ordered today.    Recent Labs: 11/21/2016: NT-Pro BNP 930 11/26/2016: Magnesium 1.5; TSH 1.320 12/05/2016: Hemoglobin 13.5; Platelets 131 12/24/2016: ALT 17 02/18/2017: BUN 14; Creatinine, Ser 2.21; Potassium 4.5; Sodium 144    Lipid Panel    Component Value Date/Time   CHOL 108 12/24/2016 1313   TRIG 89 12/24/2016 1313   HDL 35 (L) 12/24/2016 1313   CHOLHDL 3.1 12/24/2016 1313   CHOLHDL 3.2 02/28/2016 0906   VLDL 19 02/28/2016 0906   LDLCALC 55 12/24/2016 1313   LDLDIRECT 58 11/06/2011 1434       Other studies Reviewed: Additional studies/ records that were reviewed today include: . ECHO 11/21/16 Study Conclusions  - Left ventricle: The cavity size was normal. There was severe   concentric hypertrophy. Systolic function was severely reduced.   The estimated ejection fraction was in the range of 25% to 30%.   Diffuse hypokinesis. - Left atrium: The atrium was severely dilated. - Right ventricle:  Systolic function was moderately reduced. - Right atrium: The atrium was moderately dilated. - Tricuspid valve: There was trivial regurgitation. - Pulmonary arteries: Systolic pressure was at the upper limits of   normal. - Inferior vena cava: The vessel was normal in size. - Pericardium, extracardiac: There was no pericardial effusion.  Impressions:  - When compared to the prior study from 02/23/2014, LVEF has   decreased from 55-60% to 25-30% with diffuse hypokinesis, RVEF is   moderately decreased. This is possibly tachycardia mediated, the   study was acquired while patient in atrial fibrillation with RVR.   EXAM: NUCLEAR MEDICINE TUMOR LOCALIZATION. PYP CARDIAC AMYLOIDOSIS SCAN WITH SPECT  IMPRESSION: 1. Visual and quantitative assessment (grade 1, H/CLL equal 1.29) are equivocal for TTR amyloidosis.    ASSESSMENT AND PLAN:  1.  Permanent a fib with RVR rate at 95 today, and with elevated BP will increase toprol XL to 100 mg BID anticoagulation on Eliquis without any bleeding  2.  Cardiomyopathy, euvolemic today, concern for amyloid, for Cardaic MRI waiting to schedule  3.  CAD with hx PCI to LCX but no chest pain today   4.   HTN elevated today increaseing toprol to 100 mg BID. He will follow up with Dr. Caryl Comes, though if abnormal MRI he may be seen earlier.   5.  OSA   Current medicines are reviewed with the patient today.  The patient Has no concerns regarding medicines.  The following changes have been made:  See above Labs/ tests ordered today include:see above  Disposition:   FU:  see above  Signed, Cecilie Kicks, NP  04/08/2017 10:21 AM    [place] [address] [place], [place], [place], [place] Phone: [phone]; Fax: [phone]

## 2017-04-08 ENCOUNTER — Telehealth: Payer: Self-pay | Admitting: Internal Medicine

## 2017-04-08 ENCOUNTER — Ambulatory Visit (INDEPENDENT_AMBULATORY_CARE_PROVIDER_SITE_OTHER): Payer: PPO | Admitting: Cardiology

## 2017-04-08 ENCOUNTER — Encounter: Payer: Self-pay | Admitting: Cardiology

## 2017-04-08 VITALS — BP 160/90 | HR 67 | Resp 16 | Ht 73.0 in | Wt 235.0 lb

## 2017-04-08 DIAGNOSIS — I10 Essential (primary) hypertension: Secondary | ICD-10-CM

## 2017-04-08 DIAGNOSIS — G4733 Obstructive sleep apnea (adult) (pediatric): Secondary | ICD-10-CM | POA: Diagnosis not present

## 2017-04-08 DIAGNOSIS — I482 Chronic atrial fibrillation, unspecified: Secondary | ICD-10-CM

## 2017-04-08 DIAGNOSIS — I428 Other cardiomyopathies: Secondary | ICD-10-CM | POA: Diagnosis not present

## 2017-04-08 DIAGNOSIS — I2510 Atherosclerotic heart disease of native coronary artery without angina pectoris: Secondary | ICD-10-CM

## 2017-04-08 MED ORDER — METOPROLOL SUCCINATE ER 50 MG PO TB24: 50.0000 mg | ORAL_TABLET | Freq: Two times a day (BID) | ORAL | 1 refills | Status: DC

## 2017-04-08 NOTE — Telephone Encounter (Signed)
Sent msg to [facility] and [facility] to schedule patient for Cardiac MRI. Orders are in form July.

## 2017-04-08 NOTE — Patient Instructions (Signed)
Medication Instructions:  INCREASE Metoprolol to Take 1 tablet twice a day   Labwork: None   Testing/Procedures: Schedule Cardiac MRI order already in chart   Follow-Up: Your physician recommends that you schedule a follow-up appointment in: first available with Dr Caryl Comes.  Any Other Special Instructions Will Be Listed Below (If Applicable).  If you need a refill on your cardiac medications before your next appointment, please call your pharmacy.

## 2017-04-08 NOTE — Telephone Encounter (Signed)
Called the patient and LVM to ask what time of day he wants his cardiac MRI.

## 2017-04-09 ENCOUNTER — Telehealth: Payer: Self-pay | Admitting: *Deleted

## 2017-04-09 NOTE — Telephone Encounter (Signed)
-----   Message from Isaiah Serge, NP sent at 04/08/2017  5:33 PM EST ----- Pt needs cardiac MRI, pt was reached but he could not understand what was being said, they were talking too fast.  Could you please assist?  Thank you.

## 2017-04-09 NOTE — Telephone Encounter (Signed)
Spoke with the pt and informed him of what a cardiac MRI looks for and how this will be arranged.  Pt education provided.  Informed the pt that once the pre-cert is complete for the cardiac MRI, the Cardiac MRI Scheduler, [place], will then call him back to have this scheduled.  Pt verbalized understanding and agrees with this plan.  Pt gracious for all the assistance provided.

## 2017-04-11 ENCOUNTER — Encounter: Payer: Self-pay | Admitting: Internal Medicine

## 2017-04-22 ENCOUNTER — Telehealth: Payer: Self-pay | Admitting: *Deleted

## 2017-04-22 NOTE — Telephone Encounter (Signed)
Patient left message on nurse line requesting DM shoes through [employer]. Spoke with Danae Chen at [phone] who will fax form to our office. Hubbard Hartshorn, RN, BSN  .

## 2017-04-23 NOTE — Telephone Encounter (Signed)
Form placed in PCP's box for completion. Hubbard Hartshorn, RN, BSN

## 2017-04-24 ENCOUNTER — Ambulatory Visit (INDEPENDENT_AMBULATORY_CARE_PROVIDER_SITE_OTHER): Payer: PPO | Admitting: Family Medicine

## 2017-04-24 ENCOUNTER — Other Ambulatory Visit: Payer: Self-pay

## 2017-04-24 ENCOUNTER — Encounter: Payer: Self-pay | Admitting: Family Medicine

## 2017-04-24 VITALS — BP 142/110 | HR 71 | Temp 98.3°F | Ht 73.0 in | Wt 235.0 lb

## 2017-04-24 DIAGNOSIS — N6082 Other benign mammary dysplasias of left breast: Secondary | ICD-10-CM | POA: Diagnosis not present

## 2017-04-24 NOTE — Telephone Encounter (Signed)
Form faxed to [facility] at [phone]. Hubbard Hartshorn, RN, BSN

## 2017-04-24 NOTE — Telephone Encounter (Signed)
Form returned to RN.

## 2017-04-24 NOTE — Progress Notes (Signed)
   CC: same day "knot" in his chest  HPI  "Knot:" noticed it about 3 months ago. At that time, it was malodorous and spontaneously draining. He comes in today because it is tender. Not red, no fever. Has had this before, notes Dr. Gerlean Ren "poked it" before. Has tried cleaning the area with soap.   ROS: denies CP, SOB, abd pain, dysuria, rash, fever.   CC, SH/smoking status, and VS noted  Objective: BP (!) 142/110   Pulse 71   Temp 98.3 F (36.8 C) (Oral)   Ht 6\' 1"  (1.854 m)   Wt 235 lb (106.6 kg)   SpO2 97%   BMI 31.00 kg/m  Gen: NAD, alert, cooperative, and pleasant. HEENT: NCAT, EOMI, PERRL CV: RRR, no murmur Resp: CTAB, no wheezes, non-labored Ext: No edema, warm Neuro: Alert and oriented, Speech clear, No gross deficits Skin: 1cm hyperpigmented roof over grape sized area of fluctuance at 9 oclock position about 3cm from left nipple. Nonerythematous, mildly tender to deep palpation.   Procedure:  Verbal consent obtained. Sebaceous cyst unroofed with 11 blade scalpel, approximately 5cc thick malodorous material expressed from cyst cavity. Did not attempt to remove cyst capsule. No bleeding or pain. Cleansed with alcohol, patient requested bandaid.   Assessment and plan:  Sebaceous cyst of breast, left Easily expressed ~5cc malodorous material after unroofing cyst with 11 blade. No bleeding or pain. No appearance consistent with infection. Counseled patient that this will likely recur. Offered derm referral for removal of capsule, patient declined at present. RTC if concern for worsening.   HTN: manual recheck by me with BP 140/90. Also follows closely with cards.   Care coordination - reviewed cardiac MRI schedule with patient, ensured he had the address for scheduled MRI on 12/3. He voiced understanding.  Health Maintenance reviewed - outside flu shot put into system.  Ralene Ok, MD, PGY2 04/25/2017 10:57 AM

## 2017-04-24 NOTE — Patient Instructions (Signed)
It was a pleasure to see you today! Thank you for choosing [facility] for your primary care. Anthony [name] was seen for sebaceous cyst.   Our plans for today were:  You can use your hand to express the sweat gland if you notice it getting full again.   Come back to the clinic or urgent care if it gets warm, red, or tender again.   You should return to our clinic to see Dr. Lindell Noe in 3 months for physical.   Best,  Dr. Lindell Noe

## 2017-04-25 DIAGNOSIS — N6082 Other benign mammary dysplasias of left breast: Secondary | ICD-10-CM | POA: Insufficient documentation

## 2017-04-25 NOTE — Assessment & Plan Note (Signed)
Easily expressed ~5cc malodorous material after unroofing cyst with 11 blade. No bleeding or pain. No appearance consistent with infection. Counseled patient that this will likely recur. Offered derm referral for removal of capsule, patient declined at present. RTC if concern for worsening.

## 2017-04-28 ENCOUNTER — Ambulatory Visit (HOSPITAL_COMMUNITY): Admission: RE | Admit: 2017-04-28 | Payer: PPO | Source: Ambulatory Visit

## 2017-05-07 ENCOUNTER — Ambulatory Visit (HOSPITAL_COMMUNITY): Admission: RE | Admit: 2017-05-07 | Payer: PPO | Source: Ambulatory Visit

## 2017-05-13 ENCOUNTER — Telehealth: Payer: Self-pay | Admitting: Family Medicine

## 2017-05-13 NOTE — Telephone Encounter (Signed)
[employer] form completed for Dr Lindell Noe as she is currently out of office. Placed in fax pile.

## 2017-05-15 ENCOUNTER — Other Ambulatory Visit: Payer: Self-pay | Admitting: Family Medicine

## 2017-05-30 ENCOUNTER — Encounter: Payer: Self-pay | Admitting: Internal Medicine

## 2017-05-30 ENCOUNTER — Ambulatory Visit (INDEPENDENT_AMBULATORY_CARE_PROVIDER_SITE_OTHER): Payer: Medicare Other | Admitting: Internal Medicine

## 2017-05-30 VITALS — BP 124/72 | HR 148 | Ht 73.0 in | Wt 230.0 lb

## 2017-05-30 DIAGNOSIS — I482 Chronic atrial fibrillation, unspecified: Secondary | ICD-10-CM

## 2017-05-30 DIAGNOSIS — E859 Amyloidosis, unspecified: Secondary | ICD-10-CM

## 2017-05-30 MED ORDER — FUROSEMIDE 40 MG PO TABS: 40.0000 mg | ORAL_TABLET | Freq: Two times a day (BID) | ORAL | 3 refills | Status: DC

## 2017-05-30 NOTE — Patient Instructions (Signed)
Medication Instructions: Your physician has recommended you make the following change in your medication:  -1) DECREASE Furosemide (lasix) 40 mg - Take 1 tablet by mouth twice daily - You may cut your 80 mg tablets in half if you have enough of them at home  Labwork: Your physician has recommended that you have pre procedural lab work: BMET, CBC, PT/INR  Procedures/Testing: Your physician has recommended that you have a Defibrillator (ICD) inserted. An implantable cardioverter defibrillator (ICD) is a small device that is placed in your chest or, in rare cases, your abdomen. This device uses electrical pulses or shocks to help control life-threatening, irregular heartbeats that could lead the heart to suddenly stop beating (sudden cardiac arrest). Leads are attached to the ICD that goes into your heart. This is done in the hospital and usually requires an overnight stay. Please see the instruction sheet given to you today for more information.  Follow-Up: Please call our office when you decide what date you would like to have your procedure Upcoming dates are: June 25 2017 June 30 2017 July 11, 2017 July 14, 2017 July 24, 2017.   If you need a refill on your cardiac medications before your next appointment, please call your pharmacy.

## 2017-05-30 NOTE — Progress Notes (Signed)
Patient Care Team: Sela Hilding, MD as PCP - General (Family Medicine)   HPI  Anthony [name] is a 67 y.o. male Seen originally for consideration of AV ablation for atrial fibrillation with a poorly controlled ventricular response.  Is a history of ischemic heart disease with stenting 2003.  He was noted to have low voltage and left ventricular hypertrophy and the issue was raised as to whether he might have transthyretin amyloid technetium; pyrophosphate imaging was notable for only grade 1 findings.  He has not undergone electrophoresis testing.  DATE TEST    7/15  Myoview    No ischemia  9/15  Echo   EF 55-60 %   6/18 Echo EF 25-30% Severe LAE     Date Cr K Mg Hgb  10/17 1.55 3.4  13.7  7/18 1.82  1.5  13.8   He continues to struggle with dyspnea on exertion   He has had episodes of presyncope. Infrequent palpitaitons  He has some edema and some sob; no chest pain   Date Cr K Hgb  9/18 2.2 4.5 13.5           Records and Results Reviewed   Past Medical History:  Diagnosis Date  . Arthritis   . CAD in native artery    a. reported MI 2000, 2001, s/p stenting of the circumflex lesion extending into the second obtuse marginal branch with a drug-eluting stent in 2003. b. low risk nuc 2015.  Marland Kitchen Chronic atrial fibrillation ([place])   . Chronic combined systolic and diastolic CHF (congestive heart failure) ([place])    a. Previously diastolic, then EF [id number] in 10/2016.  . CKD (chronic kidney disease), stage III ([place])   . Depression   . Diabetes mellitus   . Edema of extremities   . Erectile dysfunction   . GERD (gastroesophageal reflux disease)   . Hyperlipidemia   . Hypertension   . Myocardial infarction ([place]) 2000&2001  . Obesity   . Onychomycosis   . OSA (obstructive sleep apnea)   . Tubular adenoma of colon 10/2002    Past Surgical History:  Procedure Laterality Date  . ANGIOPLASTY  2001   stent x 1  . COLONOSCOPY  2000?   negative  . TOE  AMPUTATION Left 2012    Current Outpatient Medications  Medication Sig Dispense Refill  . apixaban (ELIQUIS) 5 MG TABS tablet Take 1 tablet by mouth twice a day 180 tablet 3  . calcitRIOL (ROCALTROL) 0.25 MCG capsule TAKE 0.25 MG BY MOUTH EVERY MON WED AND FRIDAY  5  . gabapentin (NEURONTIN) 300 MG capsule TAKE ONE CAPSULE BY MOUTH AT BEDTIME 90 capsule 0  . insulin NPH-regular Human (NOVOLIN 70/30) (70-30) 100 UNIT/ML injection Inject 55 Units into the skin daily at 12 noon.    Marland Kitchen losartan (COZAAR) 100 MG tablet Take 1 tablet (100 mg total) by mouth daily. 90 tablet 1  . metoprolol succinate (TOPROL-XL) 50 MG 24 hr tablet Take 1 tablet (50 mg total) 2 (two) times daily by mouth. Take with or immediately following a meal. 180 tablet 1  . Multiple Vitamin (MULTIVITAMIN) tablet Take 1 tablet by mouth daily.      . nitroGLYCERIN (NITROSTAT) 0.4 MG SL tablet Place 0.4 mg under the tongue every 5 (five) minutes as needed for chest pain. If no relief by 3rd tab, call 911     . Omega 3 1000 MG CAPS Take 1,000 mg by mouth 3 (three) times daily.     Marland Kitchen  oxymorphone (OPANA) 5 MG tablet Take 5 mg by mouth every 6 (six) hours as needed for pain (max 3 tablets daily). Per Dr Brien Few    . PARoxetine (PAXIL) 40 MG tablet Take 1 tablet (40 mg total) by mouth every morning. 90 tablet 3  . potassium chloride SA (K-DUR,KLOR-CON) 20 MEQ tablet Take 1 tablet (20 mEq total) by mouth 2 (two) times daily. 180 tablet 3  . ranitidine (ZANTAC) 150 MG tablet Take 150 mg by mouth daily.    . rosuvastatin (CRESTOR) 5 MG tablet Take 1 tablet (5 mg total) by mouth daily. 30 tablet 11  . furosemide (LASIX) 40 MG tablet Take 1 tablet (40 mg total) by mouth 2 (two) times daily. 180 tablet 3   No current facility-administered medications for this visit.     Allergies  Allergen Reactions  . Enalapril Maleate Cough      Review of Systems negative except from HPI and PMH  Physical Exam BP 124/72   Pulse (!) 148   Ht 6' 1"$   (1.854 m)   Wt 230 lb (104.3 kg)   BMI 30.34 kg/m  Well developed and nourished in no acute distress HENT normal Neck supple with JVP-8-10 Carotids brisk and full without bruits Clear Irregularly irregular rate and rhythm with rapid  ventricular response, no murmurs or gallops Abd-soft with active BS without hepatomegaly No Clubbing cyanosis 1+edema Skin-warm and dry A & Oriented  Grossly normal sensory and motor function  ECG AFib at 140   Assessment and  Plan  Atrial fibrillation-permanent rapid ventricular response  Low-voltage limb leads and severe concentric LVH concerning for amyloid   Congestive heart failure-chronic-systolic-class IIb 3  Hypertension  Presyncope  Nonischemic cardiomyopathy-new  The pts rapid atrial fibrillation requries rate control, and the initial thought about AV ablation seems the most reasonable approach  With LV dysfunction and presyncope and young age would favor ICD implantation and CRT for risk reduction. We would anticipate subsequent AV ablaton  The issue of his possible transthyreitin amyloid remains unresolved, and would recommend serum IFE   We have discussed the role of an ICD  And the procedure risks and benefits  After wards, he was in agreement with proceeding, but afterwards he called back expressing reservation  We will plan to meet again in a number of weeks to reconsider  More than 50% of 40 min was spent in counseling related to the above    Current medicines are reviewed at length with the patient today .  The patient does not  have concerns regarding medicines.

## 2017-06-03 ENCOUNTER — Telehealth: Payer: Self-pay | Admitting: *Deleted

## 2017-06-03 NOTE — Telephone Encounter (Signed)
Received message on nurse line from Jackelyn Poling, [facility] with [employer] stating patient's coverage ended 05/26/2017. Will no longer be able to cover diabetic shoes that were ordered. Any questions Jackelyn Poling may be reached at [phone] Ref # [id number]. Hubbard Hartshorn, RN, BSN

## 2017-06-04 ENCOUNTER — Telehealth: Payer: Self-pay | Admitting: Internal Medicine

## 2017-06-04 DIAGNOSIS — I5041 Acute combined systolic (congestive) and diastolic (congestive) heart failure: Secondary | ICD-10-CM

## 2017-06-04 DIAGNOSIS — Z01812 Encounter for preprocedural laboratory examination: Secondary | ICD-10-CM

## 2017-06-04 NOTE — Telephone Encounter (Signed)
Pt would like to have procedure on 2/15 if possible.  Pt ok with 2/18 as well if 15th not possible.  Advised I would send message to EP nurses and have one of them f/u with him to get scheduled for procedure.  Pt verbalized understanding and was appreciative for call.

## 2017-06-04 NOTE — Telephone Encounter (Signed)
Anthony [name] is calling to give a date to have the Defib/ICD placed . Please call

## 2017-06-05 ENCOUNTER — Ambulatory Visit (INDEPENDENT_AMBULATORY_CARE_PROVIDER_SITE_OTHER): Payer: Self-pay | Admitting: Family Medicine

## 2017-06-05 VITALS — BP 138/68 | HR 73 | Temp 98.4°F | Ht 73.0 in | Wt 227.0 lb

## 2017-06-05 DIAGNOSIS — R1084 Generalized abdominal pain: Secondary | ICD-10-CM

## 2017-06-05 MED ORDER — OMEPRAZOLE 40 MG PO CPDR: 40.0000 mg | DELAYED_RELEASE_CAPSULE | Freq: Every day | ORAL | 0 refills | Status: DC

## 2017-06-05 NOTE — Progress Notes (Signed)
Subjective:     Anthony [name] is a 67 y.o. male who presents for evaluation of abdominal pain. Onset was 3 day ago. Symptoms have been gradually worsening. The pain is described as hot, and is 8/10 in intensity. Pain is located in the LLQ and RLQ without radiation.  Aggravating factors: eating.  Alleviating factors: none. Associated symptoms: anorexia and nausea. The patient denies chills, diarrhea, dysuria, hematochezia, melena and nausea.No sick contacts. Never happened before. Pt takes zantac, without much improvement. Denies leg swelling, SOB at rest. SOB last for a few seconds. Weight down from 05/30/2017 by 3lbs (230 > 227 lb). Patient is drinking water and soda in regular amounts. Endorses bloating with drinking water. Normal. Not taken any medicine for stomach discomfort.   The patient's history has been marked as reviewed and updated as appropriate. Past Medical History:  Diagnosis Date  . Arthritis   . CAD in native artery    a. reported MI 2000, 2001, s/p stenting of the circumflex lesion extending into the second obtuse marginal branch with a drug-eluting stent in 2003. b. low risk nuc 2015.  Marland Kitchen Chronic atrial fibrillation ([place])   . Chronic combined systolic and diastolic CHF (congestive heart failure) ([place])    a. Previously diastolic, then EF 75-64% in 10/2016.  . CKD (chronic kidney disease), stage III ([place])   . Depression   . Diabetes mellitus   . Edema of extremities   . Erectile dysfunction   . GERD (gastroesophageal reflux disease)   . Hyperlipidemia   . Hypertension   . Myocardial infarction ([place]) 2000&2001  . Obesity   . Onychomycosis   . OSA (obstructive sleep apnea)   . Tubular adenoma of colon 10/2002    Review of Systems Pertinent items noted in HPI and remainder of comprehensive ROS otherwise negative.     Objective:    BP 138/68 (BP Location: Left Arm, Patient Position: Sitting, Cuff Size: Normal)   Pulse 73   Temp 98.4 F (36.9 C) (Oral)   Ht 6\' 1"   (1.854 m)   Wt 227 lb (103 kg)   SpO2 97%   BMI 29.95 kg/m   General appearance: alert, cooperative and no distress Neck: no adenopathy, no JVD, supple, symmetrical, trachea midline and thyroid not enlarged, symmetric, no tenderness/mass/nodules Lungs: clear to auscultation bilaterally and normal work of breathing Chest wall: no tenderness Heart: regular rate and rhythm, S1, S2 normal, no murmur, click, rub or gallop Abdomen: abnormal findings:  obese and RUQ tenderness. No rebound or guarding Extremities: extremities normal, atraumatic, no cyanosis or edema   Assessment and Plan:    Abdominal pain, likely secondary to GERD versus biliary colic.  Biliary colic is very likely given his presentation of right quadrant tenderness and nausea associated with meals.  I do not suspect any other type of biliary or hepatic issue at this time given the patient has no elements of jaundice on exam nor does he have any fever.  GERD is also likely given his history of GERD and it being worse with meals.  Pain does not radiate to back and patient is overall well-appearing with no vomiting so I do not believe that this is presentation of pancreatitis.  Patient does have a history of diabetes, so gastroparesis is possible.  But is currently with an A1c of 5.9 and not requiring insulin.  Does have history of congestive heart failure, abdominal pain of the right upper quadrant is a sign of worsening heart failure but patient is  down 3 pounds since about a week ago at last clinic and has no signs of being volume overloaded such as crackles in the lungs JVD or lower extremity edema.  Patient is on chronic anticoagulation for a fib, so so intestinal bleeding is always a concern, but patient does not have any signs of constipation diarrhea melena or hematochezia so I think this is unlikely as well.  Will trial patient on PPI for 2 weeks to see if this does not improve symptoms.  If pain does not resolve or gets worse patient  is to come back, consider broadening workup to include right upper quadrant ultrasound to evaluate for biliary disease, CMP to look at LFTs and bilirubin, and possibly lipase to look for pancreatitis.  Strict return precautions were given to return to the clinic or to the emergency room if patient gets worse.

## 2017-06-05 NOTE — Patient Instructions (Signed)
It was a pleasure to see you today! Thank you for choosing [facility] for your primary care. Anthony [name] was seen for abdominal pain. It may be related to you reflux disease or maybe your gallbladder Come back to the clinic in two weeks if pain is not getting better, come back in 1 week if it is getting worse, go to the emergency room if your pain gets severe, you develop fever, vomiting, diarrhea, or cannot take any fluids by mouth.   Meds ordered this encounter  Medications  . omeprazole (PRILOSEC) 40 MG capsule    Sig: Take 1 capsule (40 mg total) by mouth daily.    Dispense:  30 capsule    Refill:  0     Best,  Marny Lowenstein, MD, MS FAMILY MEDICINE RESIDENT - PGY1 06/05/2017 11:25 AM\

## 2017-06-06 ENCOUNTER — Telehealth: Payer: Self-pay

## 2017-06-06 NOTE — Addendum Note (Signed)
Addended by: Willeen Cass A on: 06/06/2017 02:02 PM   Modules accepted: Orders

## 2017-06-06 NOTE — Telephone Encounter (Signed)
error 

## 2017-06-06 NOTE — Telephone Encounter (Signed)
Outreach made to Pt.  Pt would like to have procedure 07/11/2017 @ 9:30 am.  Procedure scheduled with cath lab.  Pt to come to [facility] office on July 02, 2017 for lab work, Materials engineer and CHG scrub. Directions to admitting given.  Discussed holding Eliquis 2 days prior to procedure.  Will call Pt to remind him of lab appointment.  No further action needed at this time.

## 2017-06-12 ENCOUNTER — Emergency Department (HOSPITAL_COMMUNITY): Payer: Medicare Other

## 2017-06-12 ENCOUNTER — Other Ambulatory Visit: Payer: Self-pay

## 2017-06-12 ENCOUNTER — Encounter (HOSPITAL_COMMUNITY): Payer: Self-pay | Admitting: Emergency Medicine

## 2017-06-12 ENCOUNTER — Inpatient Hospital Stay (HOSPITAL_COMMUNITY)
Admission: EM | Admit: 2017-06-12 | Discharge: 2017-06-20 | DRG: 637 | Disposition: A | Discharge status: Skilled Nursing Facility | Payer: Medicare Other | Attending: Family Medicine | Admitting: Family Medicine

## 2017-06-12 DIAGNOSIS — M25571 Pain in right ankle and joints of right foot: Secondary | ICD-10-CM | POA: Diagnosis not present

## 2017-06-12 DIAGNOSIS — I4891 Unspecified atrial fibrillation: Secondary | ICD-10-CM | POA: Diagnosis present

## 2017-06-12 DIAGNOSIS — N2581 Secondary hyperparathyroidism of renal origin: Secondary | ICD-10-CM | POA: Diagnosis present

## 2017-06-12 DIAGNOSIS — E1122 Type 2 diabetes mellitus with diabetic chronic kidney disease: Secondary | ICD-10-CM | POA: Diagnosis present

## 2017-06-12 DIAGNOSIS — Z79899 Other long term (current) drug therapy: Secondary | ICD-10-CM

## 2017-06-12 DIAGNOSIS — K219 Gastro-esophageal reflux disease without esophagitis: Secondary | ICD-10-CM | POA: Diagnosis present

## 2017-06-12 DIAGNOSIS — Z87891 Personal history of nicotine dependence: Secondary | ICD-10-CM

## 2017-06-12 DIAGNOSIS — E785 Hyperlipidemia, unspecified: Secondary | ICD-10-CM | POA: Diagnosis present

## 2017-06-12 DIAGNOSIS — R569 Unspecified convulsions: Secondary | ICD-10-CM | POA: Diagnosis present

## 2017-06-12 DIAGNOSIS — M25572 Pain in left ankle and joints of left foot: Secondary | ICD-10-CM | POA: Diagnosis present

## 2017-06-12 DIAGNOSIS — Z794 Long term (current) use of insulin: Secondary | ICD-10-CM

## 2017-06-12 DIAGNOSIS — Z91128 Patient's intentional underdosing of medication regimen for other reason: Secondary | ICD-10-CM

## 2017-06-12 DIAGNOSIS — N189 Chronic kidney disease, unspecified: Secondary | ICD-10-CM | POA: Diagnosis present

## 2017-06-12 DIAGNOSIS — E874 Mixed disorder of acid-base balance: Secondary | ICD-10-CM | POA: Diagnosis present

## 2017-06-12 DIAGNOSIS — E119 Type 2 diabetes mellitus without complications: Secondary | ICD-10-CM | POA: Diagnosis not present

## 2017-06-12 DIAGNOSIS — Z8249 Family history of ischemic heart disease and other diseases of the circulatory system: Secondary | ICD-10-CM

## 2017-06-12 DIAGNOSIS — I4581 Long QT syndrome: Secondary | ICD-10-CM | POA: Diagnosis present

## 2017-06-12 DIAGNOSIS — Z89422 Acquired absence of other left toe(s): Secondary | ICD-10-CM

## 2017-06-12 DIAGNOSIS — E1142 Type 2 diabetes mellitus with diabetic polyneuropathy: Secondary | ICD-10-CM | POA: Diagnosis present

## 2017-06-12 DIAGNOSIS — E7800 Pure hypercholesterolemia, unspecified: Secondary | ICD-10-CM | POA: Diagnosis present

## 2017-06-12 DIAGNOSIS — R739 Hyperglycemia, unspecified: Secondary | ICD-10-CM

## 2017-06-12 DIAGNOSIS — Z955 Presence of coronary angioplasty implant and graft: Secondary | ICD-10-CM

## 2017-06-12 DIAGNOSIS — N183 Chronic kidney disease, stage 3 unspecified: Secondary | ICD-10-CM

## 2017-06-12 DIAGNOSIS — G4733 Obstructive sleep apnea (adult) (pediatric): Secondary | ICD-10-CM | POA: Diagnosis present

## 2017-06-12 DIAGNOSIS — I130 Hypertensive heart and chronic kidney disease with heart failure and stage 1 through stage 4 chronic kidney disease, or unspecified chronic kidney disease: Secondary | ICD-10-CM | POA: Diagnosis present

## 2017-06-12 DIAGNOSIS — N179 Acute kidney failure, unspecified: Secondary | ICD-10-CM | POA: Diagnosis present

## 2017-06-12 DIAGNOSIS — I5042 Chronic combined systolic (congestive) and diastolic (congestive) heart failure: Secondary | ICD-10-CM | POA: Diagnosis present

## 2017-06-12 DIAGNOSIS — F329 Major depressive disorder, single episode, unspecified: Secondary | ICD-10-CM | POA: Diagnosis present

## 2017-06-12 DIAGNOSIS — G40409 Other generalized epilepsy and epileptic syndromes, not intractable, without status epilepticus: Secondary | ICD-10-CM | POA: Diagnosis present

## 2017-06-12 DIAGNOSIS — I10 Essential (primary) hypertension: Secondary | ICD-10-CM | POA: Diagnosis not present

## 2017-06-12 DIAGNOSIS — R413 Other amnesia: Secondary | ICD-10-CM

## 2017-06-12 DIAGNOSIS — J349 Unspecified disorder of nose and nasal sinuses: Secondary | ICD-10-CM | POA: Diagnosis present

## 2017-06-12 DIAGNOSIS — I482 Chronic atrial fibrillation: Secondary | ICD-10-CM | POA: Diagnosis present

## 2017-06-12 DIAGNOSIS — I6381 Other cerebral infarction due to occlusion or stenosis of small artery: Secondary | ICD-10-CM | POA: Diagnosis present

## 2017-06-12 DIAGNOSIS — Z7901 Long term (current) use of anticoagulants: Secondary | ICD-10-CM

## 2017-06-12 DIAGNOSIS — E1110 Type 2 diabetes mellitus with ketoacidosis without coma: Principal | ICD-10-CM | POA: Diagnosis present

## 2017-06-12 DIAGNOSIS — G8929 Other chronic pain: Secondary | ICD-10-CM | POA: Diagnosis present

## 2017-06-12 DIAGNOSIS — R471 Dysarthria and anarthria: Secondary | ICD-10-CM | POA: Diagnosis present

## 2017-06-12 DIAGNOSIS — R001 Bradycardia, unspecified: Secondary | ICD-10-CM | POA: Diagnosis not present

## 2017-06-12 DIAGNOSIS — R4189 Other symptoms and signs involving cognitive functions and awareness: Secondary | ICD-10-CM | POA: Diagnosis not present

## 2017-06-12 DIAGNOSIS — I2510 Atherosclerotic heart disease of native coronary artery without angina pectoris: Secondary | ICD-10-CM | POA: Diagnosis present

## 2017-06-12 DIAGNOSIS — T383X6A Underdosing of insulin and oral hypoglycemic [antidiabetic] drugs, initial encounter: Secondary | ICD-10-CM | POA: Diagnosis present

## 2017-06-12 DIAGNOSIS — E876 Hypokalemia: Secondary | ICD-10-CM | POA: Diagnosis not present

## 2017-06-12 DIAGNOSIS — I252 Old myocardial infarction: Secondary | ICD-10-CM

## 2017-06-12 DIAGNOSIS — Z8673 Personal history of transient ischemic attack (TIA), and cerebral infarction without residual deficits: Secondary | ICD-10-CM

## 2017-06-12 DIAGNOSIS — I6389 Other cerebral infarction: Secondary | ICD-10-CM | POA: Diagnosis present

## 2017-06-12 DIAGNOSIS — R4182 Altered mental status, unspecified: Secondary | ICD-10-CM

## 2017-06-12 DIAGNOSIS — D696 Thrombocytopenia, unspecified: Secondary | ICD-10-CM | POA: Diagnosis not present

## 2017-06-12 HISTORY — DX: Unspecified convulsions: R56.9

## 2017-06-12 LAB — COMPREHENSIVE METABOLIC PANEL
ALT: 34 U/L (ref 17–63)
AST: 48 U/L — ABNORMAL HIGH (ref 15–41)
Albumin: 3 g/dL — ABNORMAL LOW (ref 3.5–5.0)
Alkaline Phosphatase: 120 U/L (ref 38–126)
Anion gap: 23 — ABNORMAL HIGH (ref 5–15)
BUN: 27 mg/dL — ABNORMAL HIGH (ref 6–20)
CO2: 14 mmol/L — ABNORMAL LOW (ref 22–32)
Calcium: 8.8 mg/dL — ABNORMAL LOW (ref 8.9–10.3)
Chloride: 94 mmol/L — ABNORMAL LOW (ref 101–111)
Creatinine, Ser: 3.3 mg/dL — ABNORMAL HIGH (ref 0.61–1.24)
GFR calc Af Amer: 21 mL/min — ABNORMAL LOW (ref >60)
GFR calc non Af Amer: 18 mL/min — ABNORMAL LOW (ref >60)
Glucose, Bld: 663 mg/dL (ref 65–99)
Potassium: 4.4 mmol/L (ref 3.5–5.1)
Sodium: 131 mmol/L — ABNORMAL LOW (ref 135–145)
Total Bilirubin: 1.1 mg/dL (ref 0.3–1.2)
Total Protein: 6.6 g/dL (ref 6.5–8.1)

## 2017-06-12 LAB — URINALYSIS, ROUTINE W REFLEX MICROSCOPIC
Bilirubin Urine: NEGATIVE
Glucose, UA: >=500 mg/dL — AB
Ketones, ur: 5 mg/dL — AB
Leukocytes, UA: NEGATIVE
Nitrite: NEGATIVE
Protein, ur: 100 mg/dL — AB
Specific Gravity, Urine: 1.016 (ref 1.005–1.030)
pH: 5 (ref 5.0–8.0)

## 2017-06-12 LAB — CBC WITH DIFFERENTIAL/PLATELET
Basophils Absolute: 0 10*3/uL (ref 0.0–0.1)
Basophils Relative: 0 %
Eosinophils Absolute: 0.1 10*3/uL (ref 0.0–0.7)
Eosinophils Relative: 1 %
HCT: 44.4 % (ref 39.0–52.0)
Hemoglobin: 14.9 g/dL (ref 13.0–17.0)
Lymphocytes Relative: 38 %
Lymphs Abs: 3.3 10*3/uL (ref 0.7–4.0)
MCH: 29.6 pg (ref 26.0–34.0)
MCHC: 33.6 g/dL (ref 30.0–36.0)
MCV: 88.1 fL (ref 78.0–100.0)
Monocytes Absolute: 0.6 10*3/uL (ref 0.1–1.0)
Monocytes Relative: 7 %
Neutro Abs: 4.6 10*3/uL (ref 1.7–7.7)
Neutrophils Relative %: 54 %
Platelets: 144 10*3/uL — ABNORMAL LOW (ref 150–400)
RBC: 5.04 MIL/uL (ref 4.22–5.81)
RDW: 14.2 % (ref 11.5–15.5)
WBC: 8.5 10*3/uL (ref 4.0–10.5)

## 2017-06-12 LAB — CBG MONITORING, ED
Glucose-Capillary: 573 mg/dL (ref 65–99)
Glucose-Capillary: >600 mg/dL (ref 65–99)
Glucose-Capillary: >600 mg/dL (ref 65–99)

## 2017-06-12 LAB — I-STAT CHEM 8, ED
BUN: 25 mg/dL — ABNORMAL HIGH (ref 6–20)
BUN: 28 mg/dL — ABNORMAL HIGH (ref 6–20)
Calcium, Ion: 1.04 mmol/L — ABNORMAL LOW (ref 1.15–1.40)
Calcium, Ion: 1.07 mmol/L — ABNORMAL LOW (ref 1.15–1.40)
Chloride: 101 mmol/L (ref 101–111)
Chloride: 96 mmol/L — ABNORMAL LOW (ref 101–111)
Creatinine, Ser: 2.8 mg/dL — ABNORMAL HIGH (ref 0.61–1.24)
Creatinine, Ser: 2.8 mg/dL — ABNORMAL HIGH (ref 0.61–1.24)
Glucose, Bld: 632 mg/dL (ref 65–99)
Glucose, Bld: 635 mg/dL (ref 65–99)
HCT: 39 % (ref 39.0–52.0)
HCT: 48 % (ref 39.0–52.0)
Hemoglobin: 13.3 g/dL (ref 13.0–17.0)
Hemoglobin: 16.3 g/dL (ref 13.0–17.0)
Potassium: 4.1 mmol/L (ref 3.5–5.1)
Potassium: 4.4 mmol/L (ref 3.5–5.1)
Sodium: 132 mmol/L — ABNORMAL LOW (ref 135–145)
Sodium: 134 mmol/L — ABNORMAL LOW (ref 135–145)
TCO2: 16 mmol/L — ABNORMAL LOW (ref 22–32)
TCO2: 19 mmol/L — ABNORMAL LOW (ref 22–32)

## 2017-06-12 LAB — I-STAT ARTERIAL BLOOD GAS, ED
Acid-base deficit: 9 mmol/L — ABNORMAL HIGH (ref 0.0–2.0)
Bicarbonate: 17 mmol/L — ABNORMAL LOW (ref 20.0–28.0)
O2 Saturation: 100 %
Patient temperature: 96.3
TCO2: 18 mmol/L — ABNORMAL LOW (ref 22–32)
pCO2 arterial: 35.4 mmHg (ref 32.0–48.0)
pH, Arterial: 7.284 — ABNORMAL LOW (ref 7.350–7.450)
pO2, Arterial: 331 mmHg — ABNORMAL HIGH (ref 83.0–108.0)

## 2017-06-12 LAB — PROTIME-INR
INR: 1.33
Prothrombin Time: 16.4 seconds — ABNORMAL HIGH (ref 11.4–15.2)

## 2017-06-12 LAB — ETHANOL: Alcohol, Ethyl (B): <10 mg/dL (ref <10)

## 2017-06-12 MED ORDER — FENTANYL CITRATE (PF) 100 MCG/2ML IJ SOLN: 50.0000 ug | INTRAMUSCULAR | Status: DC | PRN

## 2017-06-12 MED ORDER — PROPOFOL 10 MG/ML IV BOLUS
INTRAVENOUS | Status: AC
  Filled 2017-06-12: qty 20

## 2017-06-12 MED ORDER — DEXTROSE-NACL 5-0.45 % IV SOLN: INTRAVENOUS | Status: DC

## 2017-06-12 MED ORDER — FENTANYL 2500MCG IN NS 250ML (10MCG/ML) PREMIX INFUSION
25.0000 ug/h | INTRAVENOUS | Status: DC
  Administered 2017-06-13: 25 ug/h via INTRAVENOUS
  Filled 2017-06-12 (×2): qty 250

## 2017-06-12 MED ORDER — FENTANYL BOLUS VIA INFUSION
25.0000 ug | INTRAVENOUS | Status: DC | PRN
  Filled 2017-06-12: qty 25

## 2017-06-12 MED ORDER — DOCUSATE SODIUM 50 MG/5ML PO LIQD
100.0000 mg | Freq: Two times a day (BID) | ORAL | Status: DC | PRN
  Filled 2017-06-12: qty 10

## 2017-06-12 MED ORDER — SODIUM CHLORIDE 0.9 % IV SOLN
2000.0000 mg | Freq: Once | INTRAVENOUS | Status: AC
  Administered 2017-06-12: 2000 mg via INTRAVENOUS
  Filled 2017-06-12: qty 20

## 2017-06-12 MED ORDER — LORAZEPAM 2 MG/ML IJ SOLN: 2.0000 mg | INTRAMUSCULAR | Status: DC | PRN

## 2017-06-12 MED ORDER — SODIUM CHLORIDE 0.9 % IV BOLUS (SEPSIS)
1000.0000 mL | Freq: Once | INTRAVENOUS | Status: AC
  Administered 2017-06-12: 1000 mL via INTRAVENOUS

## 2017-06-12 MED ORDER — POTASSIUM CHLORIDE 10 MEQ/100ML IV SOLN
10.0000 meq | INTRAVENOUS | Status: AC
  Administered 2017-06-13: 10 meq via INTRAVENOUS
  Filled 2017-06-12: qty 100

## 2017-06-12 MED ORDER — PROPOFOL 10 MG/ML IV BOLUS
INTRAVENOUS | Status: AC | PRN
  Administered 2017-06-12: 80 mg via INTRAVENOUS

## 2017-06-12 MED ORDER — MIDAZOLAM HCL 2 MG/2ML IJ SOLN
1.0000 mg | INTRAMUSCULAR | Status: DC | PRN
  Filled 2017-06-12: qty 2

## 2017-06-12 MED ORDER — SODIUM CHLORIDE 0.9 % IV SOLN
INTRAVENOUS | Status: DC
  Administered 2017-06-12: 5.1 [IU]/h via INTRAVENOUS
  Filled 2017-06-12 (×3): qty 1

## 2017-06-12 MED ORDER — SODIUM CHLORIDE 0.9 % IV SOLN: INTRAVENOUS | Status: DC

## 2017-06-12 MED ORDER — ONDANSETRON HCL 4 MG/2ML IJ SOLN: 4.0000 mg | Freq: Four times a day (QID) | INTRAMUSCULAR | Status: DC | PRN

## 2017-06-12 MED ORDER — FENTANYL CITRATE (PF) 100 MCG/2ML IJ SOLN
50.0000 ug | Freq: Once | INTRAMUSCULAR | Status: AC
  Administered 2017-06-12: 50 ug via INTRAVENOUS

## 2017-06-12 MED ORDER — PANTOPRAZOLE SODIUM 40 MG IV SOLR
40.0000 mg | Freq: Every day | INTRAVENOUS | Status: DC
  Administered 2017-06-13 – 2017-06-16 (×5): 40 mg via INTRAVENOUS
  Filled 2017-06-12 (×5): qty 40

## 2017-06-12 MED ORDER — PROPOFOL 1000 MG/100ML IV EMUL
INTRAVENOUS | Status: AC
  Administered 2017-06-12: 10 ug/kg/min
  Filled 2017-06-12: qty 100

## 2017-06-12 MED ORDER — FENTANYL CITRATE (PF) 100 MCG/2ML IJ SOLN
INTRAMUSCULAR | Status: AC
  Filled 2017-06-12: qty 2

## 2017-06-12 MED ORDER — THIAMINE HCL 100 MG/ML IJ SOLN
100.0000 mg | Freq: Every day | INTRAMUSCULAR | Status: DC
  Administered 2017-06-13 – 2017-06-15 (×4): 100 mg via INTRAVENOUS
  Filled 2017-06-12 (×4): qty 2

## 2017-06-12 MED ORDER — FOLIC ACID 5 MG/ML IJ SOLN
1.0000 mg | Freq: Every day | INTRAMUSCULAR | Status: DC
  Administered 2017-06-13 – 2017-06-14 (×2): 1 mg via INTRAVENOUS
  Filled 2017-06-12 (×3): qty 0.2

## 2017-06-12 MED ORDER — MIDAZOLAM HCL 2 MG/2ML IJ SOLN
1.0000 mg | INTRAMUSCULAR | Status: DC | PRN
  Administered 2017-06-13 – 2017-06-15 (×5): 1 mg via INTRAVENOUS
  Filled 2017-06-12 (×3): qty 2

## 2017-06-12 MED ORDER — SUCCINYLCHOLINE CHLORIDE 20 MG/ML IJ SOLN
INTRAMUSCULAR | Status: AC | PRN
  Administered 2017-06-12: 100 mg via INTRAVENOUS

## 2017-06-12 MED ORDER — SODIUM CHLORIDE 0.9 % IV BOLUS (SEPSIS)
500.0000 mL | Freq: Once | INTRAVENOUS | Status: AC
Start: 2017-06-12 — End: 2017-06-12
  Administered 2017-06-12: 500 mL via INTRAVENOUS

## 2017-06-12 MED ORDER — BISACODYL 10 MG RE SUPP
10.0000 mg | Freq: Every day | RECTAL | Status: DC | PRN
Start: 2017-06-12 — End: 2017-06-20

## 2017-06-12 MED ORDER — DEXMEDETOMIDINE HCL 200 MCG/2ML IV SOLN
0.0000 ug/kg/h | INTRAVENOUS | Status: DC
  Administered 2017-06-12: 0.4 ug/kg/h via INTRAVENOUS
  Filled 2017-06-12: qty 2

## 2017-06-12 NOTE — ED Notes (Signed)
Date and time results received: 06/12/17 2220 (use smartphrase ".now" to insert current time)  Test: Glucose Critical Value: 663  Name of Provider Notified: Dr. Laverta Baltimore Orders Received? Or Actions Taken?: no new orders

## 2017-06-12 NOTE — ED Triage Notes (Signed)
Pt brought to ED by [facility] from home after having one episode of seizures witnessed by wife, no prior hx of seizure, pt mostly unresponsive on ED arrival. Per EMS GCS 13, pt not responsive on arrival LSW per wife at 66

## 2017-06-12 NOTE — ED Triage Notes (Signed)
5 mg of Versed given by EMS pta for seizure activity, pt on coumadin. BP 162/89, HR 150 R 32 100% NRM.

## 2017-06-12 NOTE — ED Notes (Signed)
Temp foley catheter inserted as ordered, 16 F temp foley not available on the ED at this time a 56 F placed instead.

## 2017-06-12 NOTE — H&P (Signed)
.. ..  Name: Anthony [name] MRN: [id number] DOB: 01-11-1951    ADMISSION DATE:  06/12/2017 CONSULTATION DATE: 06/12/2017  REFERRING MD :  Marlou Sa, MD  CHIEF COMPLAINT: Seizures  BRIEF PATIENT DESCRIPTION: 67 year old male with past medical history significant for chronic A. fib on Eliquis, CAD, insulin-dependent diabetes mellitus, hypertension, CHF presents with new onset seizure intubated P CCM consulted for management and admission.  SIGNIFICANT EVENTS  New onset seizures  STUDIES:  CTH w/o contrast   HISTORY OF PRESENT ILLNESS:  67 year old male with past medical history significant for atrial fibrillation on Eliquis, insulin-dependent diabetes mellitus, hypertension, CAD, venous insufficiency, acute combined systolic and diastolic CHF NYHA class II, OSA on CPAP, GERD, secondary hyperparathyroidism, CKD stage III.  The patient presents to the [facility] from home after having one episode of seizure witnessed by his wife no prior history of seizures documented.  In route to ED EMS gave 5 mg of Versed for seizure activity, blood pressure noted to be 162/89 heart rate 150 respiratory rate 32 and a 100% on nonrebreather mask. per ED documentation GCS on arrival was 13 patient was not responsive.  Intubated in ED  Per family pt has not been on his insulin for the last 4 days.Also complained of abdominal pain  Usual state of health prior to today.  PAST MEDICAL HISTORY :   has a past medical history of Arthritis, CAD in native artery, Chronic atrial fibrillation ([place]), Chronic combined systolic and diastolic CHF (congestive heart failure) ([place]), CKD (chronic kidney disease), stage III ([place]), Depression, Diabetes mellitus, Edema of extremities, Erectile dysfunction, GERD (gastroesophageal reflux disease), Hyperlipidemia, Hypertension, Myocardial infarction ([place]) (2000&2001), Obesity, Onychomycosis, OSA (obstructive sleep apnea), and Tubular adenoma of colon (10/2002).  has a past surgical history that  includes Angioplasty (2001); Toe amputation (Left, 2012); and Colonoscopy (2000?). Prior to Admission medications   Medication Sig Start Date End Date Taking? Authorizing Provider  apixaban (ELIQUIS) 5 MG TABS tablet Take 1 tablet by mouth twice a day 03/04/17   Dorothy Spark, MD  calcitRIOL (ROCALTROL) 0.25 MCG capsule TAKE 0.25 MG BY MOUTH EVERY MON WED AND FRIDAY 08/06/16   [provider]  furosemide (LASIX) 40 MG tablet Take 1 tablet (40 mg total) by mouth 2 (two) times daily. 05/30/17 08/28/17  Deboraha Sprang, MD  gabapentin (NEURONTIN) 300 MG capsule TAKE ONE CAPSULE BY MOUTH AT BEDTIME 05/15/17   Bufford Lope, DO  insulin NPH-regular Human (NOVOLIN 70/30) (70-30) 100 UNIT/ML injection Inject 55 Units into the skin daily at 12 noon.    [provider]  losartan (COZAAR) 100 MG tablet Take 1 tablet (100 mg total) by mouth daily. 01/16/17   Sela Hilding, MD  metoprolol succinate (TOPROL-XL) 50 MG 24 hr tablet Take 1 tablet (50 mg total) 2 (two) times daily by mouth. Take with or immediately following a meal. 04/08/17   Isaiah Serge, NP  Multiple Vitamin (MULTIVITAMIN) tablet Take 1 tablet by mouth daily.      [provider]  nitroGLYCERIN (NITROSTAT) 0.4 MG SL tablet Place 0.4 mg under the tongue every 5 (five) minutes as needed for chest pain. If no relief by 3rd tab, call 911     [provider]  Omega 3 1000 MG CAPS Take 1,000 mg by mouth 3 (three) times daily.     [provider]  omeprazole (PRILOSEC) 40 MG capsule Take 1 capsule (40 mg total) by mouth daily. 06/05/17   Bonnita Hollow, MD  oxymorphone (  OPANA) 5 MG tablet Take 5 mg by mouth every 6 (six) hours as needed for pain (max 3 tablets daily). Per Dr Alvin Critchley, MD  PARoxetine (PAXIL) 40 MG tablet Take 1 tablet (40 mg total) by mouth every morning. 12/30/16   Sela Hilding, MD  potassium chloride SA (K-DUR,KLOR-CON) 20 MEQ tablet Take 1 tablet (20 mEq total)  by mouth 2 (two) times daily. 11/14/16   Lyda Jester M, PA-C  rosuvastatin (CRESTOR) 5 MG tablet Take 1 tablet (5 mg total) by mouth daily. 11/12/16   Consuelo Pandy, PA-C   Allergies  Allergen Reactions  . Enalapril Maleate Cough    FAMILY HISTORY:  family history includes Heart attack in his father and mother. SOCIAL HISTORY:  reports that he quit smoking about a year ago. His smoking use included cigarettes. He started smoking about 50 years ago. He has a 11.25 pack-year smoking history. he has never used smokeless tobacco. He reports that he does not drink alcohol or use drugs.  REVIEW OF SYSTEMS:  Unable to obtain due to encephalopathy pt post ictal and intubated Constitutional: Negative for fever, chills, weight loss, malaise/fatigue and diaphoresis.  HENT: Negative for hearing loss, ear pain, nosebleeds, congestion, sore throat, neck pain, tinnitus and ear discharge.   Eyes: Negative for blurred vision, double vision, photophobia, pain, discharge and redness.  Respiratory: Negative for cough, hemoptysis, sputum production, shortness of breath, wheezing and stridor.   Cardiovascular: Negative for chest pain, palpitations, orthopnea, claudication, leg swelling and PND.  Gastrointestinal: Negative for heartburn, nausea, vomiting, abdominal pain, diarrhea, constipation, blood in stool and melena.  Genitourinary: Negative for dysuria, urgency, frequency, hematuria and flank pain.  Musculoskeletal: Negative for myalgias, back pain, joint pain and falls.  Skin: Negative for itching and rash.  Neurological: Negative for dizziness, tingling, tremors, sensory change, speech change, focal weakness, seizures, loss of consciousness, weakness and headaches.  Endo/Heme/Allergies: Negative for environmental allergies and polydipsia. Does not bruise/bleed easily.  SUBJECTIVE:   VITAL SIGNS: Temp:  [96.8 F (36 C)] 96.8 F (36 C) (01/17 2115) Pulse Rate:  [80-175] 85 (01/17  2215) Resp:  [14-24] 18 (01/17 2215) BP: (96-160)/(71-129) 96/71 (01/17 2215) SpO2:  [97 %-100 %] 99 % (01/17 2215) FiO2 (%):  [100 %] 100 % (01/17 2120) Weight:  [103 kg (227 lb)] 103 kg (227 lb) (01/17 2106)  PHYSICAL EXAMINATION: General:  Intubated  Neuro:  Withdraws from noxious stimuli on precedex ggt and propofol ggt HEENT:  NCAT ETT in position and OGT in position Cardiovascular:  S1 and S2 appreciated Lungs:  No wheezing or rhonci appreciated Abdomen:  Soft ND, NT, + BS Musculoskeletal:  No gross deformities. Old scar over the right shoulder Skin:  Dry scaly along the lower ext, otherwise no rash  Recent Labs  Lab 06/12/17 2115 06/12/17 2124  NA 131* 132*  K 4.4 4.4  CL 94* 96*  CO2 14*  --   BUN 27* 28*  CREATININE 3.30* 2.80*  GLUCOSE 663* 635*   Recent Labs  Lab 06/12/17 2115 06/12/17 2124  HGB 14.9 16.3  HCT 44.4 48.0  WBC 8.5  --   PLT 144*  --    Ct Head Wo Contrast  Result Date: 06/12/2017 CLINICAL DATA:  Seizure EXAM: CT HEAD WITHOUT CONTRAST TECHNIQUE: Contiguous axial images were obtained from the base of the skull through the vertex without intravenous contrast. COMPARISON:  None. FINDINGS: Brain: Moderate atrophy. Moderate to marked small vessel ischemic changes of the  white matter. Encephalomalacia in the left frontal lobe consistent with old infarct. Gyriform areas of increased density at the left insula and inferior frontal lobe. Ventricles are nonenlarged. No mass lesion. Vascular: No hyperdense vessels.  Carotid artery calcification. Skull: No fracture. Sinuses/Orbits: Mucosal thickening and retention cysts in the maxillary sinuses, mucosal thickening in the ethmoid sinuses. No acute orbital abnormality. Other: None IMPRESSION: 1. Gyriform areas of increased density along the left insula and inferior left frontal lobe, favored to represent cortical calcifications as opposed to hemorrhage, this could be related to sequela of prior necrosis or  infection. Evidence of old left frontal lobe infarct. 2. Atrophy and fairly extensive small vessel ischemic changes of the white matter. 3. Sinus disease. Electronically Signed   By: Donavan Foil M.D.   On: 06/12/2017 22:11   Dg Chest Portable 1 View  Result Date: 06/12/2017 CLINICAL DATA:  Intubation. EXAM: PORTABLE CHEST 1 VIEW COMPARISON:  August 28, 2005 FINDINGS: The mediastinal contour is normal. The heart size is enlarged. Endotracheal tube is identified distal tip 3.9 cm from carina. There is no pleural line to suggest pneumothorax. There is mild interstitial edema. There is no focal pneumonia or pleural effusion. Nasogastric tube is identified distal tip in the proximal stomach. The bony structures are unremarkable. IMPRESSION: Endotracheal tube in good position. There is no pneumothorax. Nasogastric tube distal tip in the proximal stomach. Mild interstitial edema. Electronically Signed   By: Abelardo [name] M.D.   On: 06/12/2017 21:50    ASSESSMENT / PLAN: NEURO: New onset seizures-possibly precipitated by metabolic derangements secondary to uncontrolled diabetes RASS goal -1 Neuro consulted Antiepileptic drug: Loaded with Keppra 2gIV GCS on presentation 13 Currently intubated Plan for EEG CT head showed: Gyriform areas of increased density along the left insula and inferior left frontal lobe, favored to represent cortical calcifications as opposed to hemorrhage, this could be related to sequela of prior necrosis or infection. Evidence of old left frontal lobe infarct.--> may warrant an LP 2. Atrophy and fairly extensive small vessel ischemic changes of the white matter. 3. Sinus disease. No active bleeding will continue on Eliquis for chronic Afib  CARDIAC: Chronic systolic and diastolic heart failure Patient previously on beta-blockers Last EF on 11/21/2016 noted 25% -   30%-this is a decline from EF 55-60% seen in 2015 Diffuse hypokinesis noted Prolonged QTc -EKG 515 avoid  medications that will further prolong this interval   PULMONARY: Intubated for airway protection secondary to decreased mental status and postictal state:  Mechanical ventilation with PRVC ABG noted: 4.782/95.62/130/86 Metabolic acidosis with respiratory alkalosis Will increase minute ventilation to bring pH to 7.35 We will wean FiO2 to keep sats greater than 92% When neurological status improves patient will be assessed for possible extubation.   ID: No outright evidence of an active infection No leukocytosis white count note is 8.5 No cultures obtained No need for empiric antibiotics at this point Trend white count, and fever curve Send PCT  Endocrine: Diabetic ketoacidosis versus HHS Will send beta hydroxybutyrate Patient has a history of insulin-dependent diabetes mellitus Uncontrolled blood glucose may have precipitated seizures Start on phase 2 ICU glycemic protocol-with insulin drip Goal blood glucose is 140-180 mg/dL check hemoglobin A1c  GI: H/o abdominal pain for 1 week ? Gastroparesis Prn zofran for active emesis currently not requiring Aware of ability to prolong QT. Keep patient n.p.o. at this time  Insert OGT If patient remains intubated for greater than 24 hours will assess for tube feeding Protonix 40  mg IV for prophylaxis  Heme: CBC appears hemoconcentrated Hemoglobin 14.9 then repeat 16.3 Platelets 144 Given cardiac history if hemoglobin less than 8 will transfuse PRBCs Type and screen No signs of active bleeding No h/o coagulopathy  Currently on Eliquis as an outpatient for chronic A. Fib Given chads 2 Vascor and no signs of active bleeding continue anticoagulation DVT PPx-> SCDs  RENAL Acute kidney injury on CKD stage III Baseline creatinine as of July 2018 was between 1.59-1.82 Currently creatinine peaked at 3.3 GFR in the 20s Avoid nephrotoxic medications Insert indwelling Foley catheter monitor urine output Will start on 0.9% normal  saline  Anion gap metabolic acidosis secondary to diabetic ketoacidosis Will send beta hydroxybutyrate Check hemoglobin A1c Trying patient on insulin drip with IV fluids BMP and VBG every 4 Anticipate pseudohyperkalemia-> last potassium 4.4 If potassium less than 4 will replace aggressively Also check magnesium level     I, Dr Seward Carol have personally reviewed patient's available data, including medical history, events of note, physical examination and test results as part of my evaluation. I have discussed with medical resident and NP Moshe Cipro and other care providers such as pharmacist, RN and Elink. The patient is critically ill with multiple organ systems failure and requires high complexity decision making for assessment and support, frequent evaluation and titration of therapies, application of advanced monitoring technologies and extensive interpretation of multiple databases. Critical Care Time devoted to patient care services described in this note is 76 Minutes. This time reflects time of care of this signee Dr Seward Carol. This critical care time does not reflect procedure time, or teaching time or supervisory time of NP Simpson etc but could involve care discussion time    DISPOSITION: ICU  CC TIME: 55 minutes PROGNOSIS: Guarded  CODE STATUS: Full  FAMILY: Daughter and wife at bedside   Signed Dr Seward Carol Pulmonary Critical Care Locums  06/12/2017, 10:35 PM

## 2017-06-12 NOTE — ED Provider Notes (Signed)
[facility] EMERGENCY DEPARTMENT Provider Note   CSN: [id number] Arrival date & time: 06/12/17  2101     History   Chief Complaint Chief Complaint  Patient presents with  . Seizures    HPI Anthony [name] is a 67 y.o. male.   Seizures   This is a new problem. The current episode started less than 1 hour ago. The problem has not changed since onset.The most recent episode lasted more than 5 minutes. Characteristics include eye deviation and loss of consciousness. The episode was witnessed. Medications administered prior to arrival include diazepam IV.    Past Medical History:  Diagnosis Date  . Arthritis   . CAD in native artery    a. reported MI 2000, 2001, s/p stenting of the circumflex lesion extending into the second obtuse marginal branch with a drug-eluting stent in 2003. b. low risk nuc 2015.  Marland Kitchen Chronic atrial fibrillation ([place])   . Chronic combined systolic and diastolic CHF (congestive heart failure) ([place])    a. Previously diastolic, then EF 49-70% in 10/2016.  . CKD (chronic kidney disease), stage III ([place])   . Depression   . Diabetes mellitus   . Edema of extremities   . Erectile dysfunction   . GERD (gastroesophageal reflux disease)   . Hyperlipidemia   . Hypertension   . Myocardial infarction ([place]) 2000&2001  . Obesity   . Onychomycosis   . OSA (obstructive sleep apnea)   . Tubular adenoma of colon 10/2002    Patient Active Problem List   Diagnosis Date Noted  . Sebaceous cyst of breast, left 04/25/2017  . CKD stage 3 due to type 2 diabetes mellitus ([place]) 01/22/2017  . Secondary hyperparathyroidism of renal origin ([place]) 01/22/2017  . Chronic anticoagulation 03/06/2016  . Lower extremity edema 06/28/2015  . Sebaceous cyst 04/27/2015  . Health care maintenance 02/19/2015  . Chronic, continuous use of opioids 10/18/2014  . Acute combined systolic and diastolic CHF, NYHA class 2 ([place]) 08/31/2014  . OSA on CPAP 04/04/2014  . Chronic  atrial fibrillation ([place]) 11/09/2013  . Tobacco abuse 06/28/2013  . Left foot pain 01/13/2013  . Low testosterone 11/06/2011  . Special screening for malignant neoplasms, colon 11/06/2011  . Chronic pain syndrome 08/01/2011  . Athlete's foot 02/28/2011  . ONYCHOMYCOSIS, TOENAILS 11/07/2008  . DIABETIC  RETINOPATHY 02/24/2008  . VENOUS INSUFFICIENCY, LEGS 12/18/2007  . NEUROPATHY, DIABETIC 07/24/2006  . IDDM (insulin dependent diabetes mellitus) ([place]) 07/24/2006  . HYPERCHOLESTEROLEMIA 07/24/2006  . OBESITY, NOS 07/24/2006  . ERECTILE DYSFUNCTION 07/24/2006  . DEPRESSIVE DISORDER, NOS 07/24/2006  . HYPERTENSION, BENIGN SYSTEMIC 07/24/2006  . Coronary atherosclerosis 07/24/2006  . GASTROESOPHAGEAL REFLUX, NO ESOPHAGITIS 07/24/2006    Past Surgical History:  Procedure Laterality Date  . ANGIOPLASTY  2001   stent x 1  . COLONOSCOPY  2000?   negative  . TOE AMPUTATION Left 2012       Home Medications    Prior to Admission medications   Medication Sig Start Date End Date Taking? Authorizing Provider  apixaban (ELIQUIS) 5 MG TABS tablet Take 1 tablet by mouth twice a day 03/04/17   Dorothy Spark, MD  calcitRIOL (ROCALTROL) 0.25 MCG capsule TAKE 0.25 MG BY MOUTH EVERY MON WED AND FRIDAY 08/06/16   [provider]  furosemide (LASIX) 40 MG tablet Take 1 tablet (40 mg total) by mouth 2 (two) times daily. 05/30/17 08/28/17  Deboraha Sprang, MD  gabapentin (NEURONTIN) 300 MG capsule TAKE ONE CAPSULE BY  MOUTH AT BEDTIME 05/15/17   Bufford Lope, DO  insulin NPH-regular Human (NOVOLIN 70/30) (70-30) 100 UNIT/ML injection Inject 55 Units into the skin daily at 12 noon.    [provider]  losartan (COZAAR) 100 MG tablet Take 1 tablet (100 mg total) by mouth daily. 01/16/17   Sela Hilding, MD  metoprolol succinate (TOPROL-XL) 50 MG 24 hr tablet Take 1 tablet (50 mg total) 2 (two) times daily by mouth. Take with or immediately following a meal. 04/08/17   Isaiah Serge, NP  Multiple Vitamin (MULTIVITAMIN) tablet Take 1 tablet by mouth daily.      [provider]  nitroGLYCERIN (NITROSTAT) 0.4 MG SL tablet Place 0.4 mg under the tongue every 5 (five) minutes as needed for chest pain. If no relief by 3rd tab, call 911     [provider]  Omega 3 1000 MG CAPS Take 1,000 mg by mouth 3 (three) times daily.     [provider]  omeprazole (PRILOSEC) 40 MG capsule Take 1 capsule (40 mg total) by mouth daily. 06/05/17   Bonnita Hollow, MD  oxymorphone (OPANA) 5 MG tablet Take 5 mg by mouth every 6 (six) hours as needed for pain (max 3 tablets daily). Per Dr Alvin Critchley, MD  PARoxetine (PAXIL) 40 MG tablet Take 1 tablet (40 mg total) by mouth every morning. 12/30/16   Sela Hilding, MD  potassium chloride SA (K-DUR,KLOR-CON) 20 MEQ tablet Take 1 tablet (20 mEq total) by mouth 2 (two) times daily. 11/14/16   Lyda Jester M, PA-C  rosuvastatin (CRESTOR) 5 MG tablet Take 1 tablet (5 mg total) by mouth daily. 11/12/16   Consuelo Pandy, PA-C    Family History Family History  Problem Relation Age of Onset  . Heart attack Mother   . Heart attack Father   . Colon cancer Neg Hx   . Stomach cancer Neg Hx     Social History Social History   Tobacco Use  . Smoking status: Former Smoker    Packs/day: 0.25    Years: 45.00    Pack years: 11.25    Types: Cigarettes    Start date: 05/28/1967    Last attempt to quit: 06/11/2016    Years since quitting: 1.0  . Smokeless tobacco: Never Used  Substance Use Topics  . Alcohol use: No    Comment: quit in 1993  . Drug use: No     Allergies   Enalapril maleate   Review of Systems Review of Systems  Unable to perform ROS: Patient unresponsive  Skin: Negative for color change and rash.  Neurological: Positive for seizures and loss of consciousness.     Physical Exam Updated Vital Signs Pulse 80   Resp (!) 24   Ht _0  (1.854 m)   Wt 103 kg (227 lb)    SpO2 100%   BMI 29.95 kg/m   Physical Exam  Constitutional: He appears well-developed and well-nourished.  HENT:  Head: Normocephalic and atraumatic.  Eyes: Conjunctivae are normal. Pupils are equal, round, and reactive to light.  83m pupils. Eye deviation to right.  Cardiovascular: Normal rate and regular rhythm.  Pulmonary/Chest: He is in respiratory distress (apnic episodes).  Abdominal: Soft. There is no tenderness.  Musculoskeletal: He exhibits no edema.  Neurological:  Not following commands.  Eyes closed.  Skin: Skin is warm and dry.  Psychiatric: He has a normal mood and affect.  Nursing note and vitals reviewed.  ED Treatments / Results  Labs (all labs ordered are listed, but only abnormal results are displayed) Labs Reviewed  CBG MONITORING, ED - Abnormal; Notable for the following components:      Result Value   Glucose-Capillary >600 (*)    All other components within normal limits  COMPREHENSIVE METABOLIC PANEL  CBC WITH DIFFERENTIAL/PLATELET  PROTIME-INR  I-STAT CHEM 8, ED    EKG  EKG Interpretation None       Radiology No results found.  Procedures Procedure Name: Intubation Date/Time: 06/12/2017 11:54 PM Performed by: Elveria Rising, MD Pre-anesthesia Checklist: Patient identified, Emergency Drugs available, Suction available and Patient being monitored Oxygen Delivery Method: Non-rebreather mask Preoxygenation: Pre-oxygenation with 100% oxygen Induction Type: IV induction and Rapid sequence Laryngoscope Size: Mac, 4 and Glidescope Grade View: Grade I Tube size: 7.5 mm Number of attempts: 1 Placement Confirmation: ETT inserted through vocal cords under direct vision,  CO2 detector and Breath sounds checked- equal and bilateral Secured at: 26 cm Tube secured with: ETT holder      (including critical care time)  Medications Ordered in ED Medications  propofol (DIPRIVAN) 1000 MG/100ML infusion (not administered)  propofol (DIPRIVAN)  10 mg/mL bolus/IV push (not administered)  propofol (DIPRIVAN) 10 mg/mL bolus/IV push (80 mg Intravenous Given 06/12/17 2108)  succinylcholine (ANECTINE) injection (100 mg Intravenous Given 06/12/17 2108)     Initial Impression / Assessment and Plan / ED Course  I have reviewed the triage vital signs and the nursing notes.  Pertinent labs & imaging results that were available during my care of the patient were reviewed by me and considered in my medical decision making (see chart for details).     Mr. Aldea is a 67 year old male with a past medical history CAD, A. fib on anticoagulation, CKD, CHF, diabetes, depression, sleep apnea who presents for seizure-like episode.  The patient has no history of seizures in the past.  He was at home and family was alerted when they heard a loud sound as the patient fell.  EMS gave Versed without complete resolution of seizure activity.  The patient is actively seizing in the emergency department and is not protecting his airway.  Patient is intubated as described above.  Chest x-ray demonstrates appropriate tube position.  Propofol is given for sedation and seizure control.  CT Head obtained and significant for atrophy and calcifications, unlikely hemorrhage.  Labs are significant for hyperglycemia, acidosis, large anion gap.  Insulin started.  Neurology is consulted and recommends Keppra loading which was performed.  Patient admitted.  Final Clinical Impressions(s) / ED Diagnoses   Final diagnoses:  Seizure ([place])  Altered mental status, unspecified altered mental status type  Hyperglycemia    ED Discharge Orders    None       Elveria Rising, MD 06/13/17 Joycelyn Das, MD 06/13/17 [phone]

## 2017-06-12 NOTE — ED Notes (Signed)
Belongings given to daughter and wife

## 2017-06-13 ENCOUNTER — Inpatient Hospital Stay (HOSPITAL_COMMUNITY): Payer: Medicare Other

## 2017-06-13 DIAGNOSIS — I4891 Unspecified atrial fibrillation: Secondary | ICD-10-CM | POA: Diagnosis present

## 2017-06-13 DIAGNOSIS — N183 Chronic kidney disease, stage 3 unspecified: Secondary | ICD-10-CM | POA: Diagnosis present

## 2017-06-13 DIAGNOSIS — N179 Acute kidney failure, unspecified: Secondary | ICD-10-CM | POA: Diagnosis present

## 2017-06-13 DIAGNOSIS — J349 Unspecified disorder of nose and nasal sinuses: Secondary | ICD-10-CM | POA: Diagnosis present

## 2017-06-13 DIAGNOSIS — N189 Chronic kidney disease, unspecified: Secondary | ICD-10-CM | POA: Diagnosis present

## 2017-06-13 DIAGNOSIS — I6389 Other cerebral infarction: Secondary | ICD-10-CM | POA: Diagnosis present

## 2017-06-13 DIAGNOSIS — R569 Unspecified convulsions: Secondary | ICD-10-CM

## 2017-06-13 LAB — GLUCOSE, CAPILLARY
Glucose-Capillary: 366 mg/dL — ABNORMAL HIGH (ref 65–99)
Glucose-Capillary: 333 mg/dL — ABNORMAL HIGH (ref 65–99)
Glucose-Capillary: 225 mg/dL — ABNORMAL HIGH (ref 65–99)
Glucose-Capillary: 156 mg/dL — ABNORMAL HIGH (ref 65–99)
Glucose-Capillary: 93 mg/dL (ref 65–99)
Glucose-Capillary: 96 mg/dL (ref 65–99)
Glucose-Capillary: 129 mg/dL — ABNORMAL HIGH (ref 65–99)
Glucose-Capillary: 135 mg/dL — ABNORMAL HIGH (ref 65–99)
Glucose-Capillary: 150 mg/dL — ABNORMAL HIGH (ref 65–99)
Glucose-Capillary: >600 mg/dL (ref 65–99)
Glucose-Capillary: 193 mg/dL — ABNORMAL HIGH (ref 65–99)
Glucose-Capillary: 262 mg/dL — ABNORMAL HIGH (ref 65–99)

## 2017-06-13 LAB — BASIC METABOLIC PANEL
Anion gap: 15 (ref 5–15)
Anion gap: 11 (ref 5–15)
Anion gap: 12 (ref 5–15)
Anion gap: 12 (ref 5–15)
Anion gap: 13 (ref 5–15)
Anion gap: 11 (ref 5–15)
BUN: 25 mg/dL — ABNORMAL HIGH (ref 6–20)
BUN: 25 mg/dL — ABNORMAL HIGH (ref 6–20)
BUN: 26 mg/dL — ABNORMAL HIGH (ref 6–20)
BUN: 27 mg/dL — ABNORMAL HIGH (ref 6–20)
BUN: 23 mg/dL — ABNORMAL HIGH (ref 6–20)
BUN: 23 mg/dL — ABNORMAL HIGH (ref 6–20)
CO2: 17 mmol/L — ABNORMAL LOW (ref 22–32)
CO2: 19 mmol/L — ABNORMAL LOW (ref 22–32)
CO2: 20 mmol/L — ABNORMAL LOW (ref 22–32)
CO2: 16 mmol/L — ABNORMAL LOW (ref 22–32)
CO2: 16 mmol/L — ABNORMAL LOW (ref 22–32)
CO2: 20 mmol/L — ABNORMAL LOW (ref 22–32)
Calcium: 8.2 mg/dL — ABNORMAL LOW (ref 8.9–10.3)
Calcium: 8 mg/dL — ABNORMAL LOW (ref 8.9–10.3)
Calcium: 8.6 mg/dL — ABNORMAL LOW (ref 8.9–10.3)
Calcium: 8.4 mg/dL — ABNORMAL LOW (ref 8.9–10.3)
Calcium: 8.5 mg/dL — ABNORMAL LOW (ref 8.9–10.3)
Calcium: 8.8 mg/dL — ABNORMAL LOW (ref 8.9–10.3)
Chloride: 101 mmol/L (ref 101–111)
Chloride: 107 mmol/L (ref 101–111)
Chloride: 110 mmol/L (ref 101–111)
Chloride: 112 mmol/L — ABNORMAL HIGH (ref 101–111)
Chloride: 111 mmol/L (ref 101–111)
Chloride: 110 mmol/L (ref 101–111)
Creatinine, Ser: 3.04 mg/dL — ABNORMAL HIGH (ref 0.61–1.24)
Creatinine, Ser: 2.92 mg/dL — ABNORMAL HIGH (ref 0.61–1.24)
Creatinine, Ser: 2.92 mg/dL — ABNORMAL HIGH (ref 0.61–1.24)
Creatinine, Ser: 2.94 mg/dL — ABNORMAL HIGH (ref 0.61–1.24)
Creatinine, Ser: 2.96 mg/dL — ABNORMAL HIGH (ref 0.61–1.24)
Creatinine, Ser: 2.89 mg/dL — ABNORMAL HIGH (ref 0.61–1.24)
GFR calc Af Amer: 23 mL/min — ABNORMAL LOW (ref >60)
GFR calc Af Amer: 24 mL/min — ABNORMAL LOW (ref >60)
GFR calc Af Amer: 24 mL/min — ABNORMAL LOW (ref >60)
GFR calc Af Amer: 24 mL/min — ABNORMAL LOW (ref >60)
GFR calc Af Amer: 24 mL/min — ABNORMAL LOW (ref >60)
GFR calc Af Amer: 25 mL/min — ABNORMAL LOW (ref >60)
GFR calc non Af Amer: 20 mL/min — ABNORMAL LOW (ref >60)
GFR calc non Af Amer: 21 mL/min — ABNORMAL LOW (ref >60)
GFR calc non Af Amer: 21 mL/min — ABNORMAL LOW (ref >60)
GFR calc non Af Amer: 21 mL/min — ABNORMAL LOW (ref >60)
GFR calc non Af Amer: 21 mL/min — ABNORMAL LOW (ref >60)
GFR calc non Af Amer: 21 mL/min — ABNORMAL LOW (ref >60)
Glucose, Bld: 661 mg/dL (ref 65–99)
Glucose, Bld: 409 mg/dL — ABNORMAL HIGH (ref 65–99)
Glucose, Bld: 102 mg/dL — ABNORMAL HIGH (ref 65–99)
Glucose, Bld: 138 mg/dL — ABNORMAL HIGH (ref 65–99)
Glucose, Bld: 197 mg/dL — ABNORMAL HIGH (ref 65–99)
Glucose, Bld: 250 mg/dL — ABNORMAL HIGH (ref 65–99)
Potassium: 4.1 mmol/L (ref 3.5–5.1)
Potassium: 3.3 mmol/L — ABNORMAL LOW (ref 3.5–5.1)
Potassium: 3.4 mmol/L — ABNORMAL LOW (ref 3.5–5.1)
Potassium: 3.6 mmol/L (ref 3.5–5.1)
Potassium: 3.4 mmol/L — ABNORMAL LOW (ref 3.5–5.1)
Potassium: 4.4 mmol/L (ref 3.5–5.1)
Sodium: 133 mmol/L — ABNORMAL LOW (ref 135–145)
Sodium: 137 mmol/L (ref 135–145)
Sodium: 142 mmol/L (ref 135–145)
Sodium: 140 mmol/L (ref 135–145)
Sodium: 140 mmol/L (ref 135–145)
Sodium: 141 mmol/L (ref 135–145)

## 2017-06-13 LAB — MAGNESIUM
Magnesium: 1.8 mg/dL (ref 1.7–2.4)
Magnesium: 1.9 mg/dL (ref 1.7–2.4)

## 2017-06-13 LAB — CBC
HCT: 37.9 % — ABNORMAL LOW (ref 39.0–52.0)
Hemoglobin: 13.2 g/dL (ref 13.0–17.0)
MCH: 29.9 pg (ref 26.0–34.0)
MCHC: 34.8 g/dL (ref 30.0–36.0)
MCV: 85.9 fL (ref 78.0–100.0)
Platelets: 121 10*3/uL — ABNORMAL LOW (ref 150–400)
RBC: 4.41 MIL/uL (ref 4.22–5.81)
RDW: 13.8 % (ref 11.5–15.5)
WBC: 6.6 10*3/uL (ref 4.0–10.5)

## 2017-06-13 LAB — POCT I-STAT 3, ART BLOOD GAS (G3+)
Acid-base deficit: 6 mmol/L — ABNORMAL HIGH (ref 0.0–2.0)
Bicarbonate: 18.7 mmol/L — ABNORMAL LOW (ref 20.0–28.0)
O2 Saturation: 99 %
Patient temperature: 36.6
TCO2: 20 mmol/L — ABNORMAL LOW (ref 22–32)
pCO2 arterial: 33.9 mmHg (ref 32.0–48.0)
pH, Arterial: 7.348 — ABNORMAL LOW (ref 7.350–7.450)
pO2, Arterial: 144 mmHg — ABNORMAL HIGH (ref 83.0–108.0)

## 2017-06-13 LAB — BETA-HYDROXYBUTYRIC ACID: Beta-Hydroxybutyric Acid: 1.78 mmol/L — ABNORMAL HIGH (ref 0.05–0.27)

## 2017-06-13 LAB — PROCALCITONIN
Procalcitonin: 0.21 ng/mL
Procalcitonin: 0.22 ng/mL

## 2017-06-13 LAB — HEMOGLOBIN A1C
Hgb A1c MFr Bld: 8.6 % — ABNORMAL HIGH (ref 4.8–5.6)
Mean Plasma Glucose: 200.12 mg/dL

## 2017-06-13 LAB — CBG MONITORING, ED
Glucose-Capillary: 435 mg/dL — ABNORMAL HIGH (ref 65–99)
Glucose-Capillary: 532 mg/dL (ref 65–99)

## 2017-06-13 LAB — LACTIC ACID, PLASMA
Lactic Acid, Venous: 3.1 mmol/L (ref 0.5–1.9)
Lactic Acid, Venous: 2.3 mmol/L (ref 0.5–1.9)

## 2017-06-13 LAB — MRSA PCR SCREENING: MRSA by PCR: NEGATIVE

## 2017-06-13 LAB — HIV ANTIBODY (ROUTINE TESTING W REFLEX): HIV Screen 4th Generation wRfx: NONREACTIVE

## 2017-06-13 MED ORDER — METOPROLOL TARTRATE 25 MG/10 ML ORAL SUSPENSION
25.0000 mg | Freq: Two times a day (BID) | ORAL | Status: DC
  Administered 2017-06-13: 25 mg
  Filled 2017-06-13 (×2): qty 10

## 2017-06-13 MED ORDER — ORAL CARE MOUTH RINSE
15.0000 mL | OROMUCOSAL | Status: DC
  Administered 2017-06-13 (×6): 15 mL via OROMUCOSAL

## 2017-06-13 MED ORDER — LACTATED RINGERS IV SOLN: INTRAVENOUS | Status: DC

## 2017-06-13 MED ORDER — DILTIAZEM HCL 100 MG IV SOLR
5.0000 mg/h | INTRAVENOUS | Status: DC
  Administered 2017-06-13: 5 mg/h via INTRAVENOUS
  Administered 2017-06-13: 15 mg/h via INTRAVENOUS
  Administered 2017-06-14: 7.5 mg/h via INTRAVENOUS
  Filled 2017-06-13 (×3): qty 100

## 2017-06-13 MED ORDER — METOPROLOL TARTRATE 25 MG/10 ML ORAL SUSPENSION
25.0000 mg | Freq: Two times a day (BID) | ORAL | Status: DC
  Filled 2017-06-13: qty 10

## 2017-06-13 MED ORDER — INSULIN ASPART 100 UNIT/ML ~~LOC~~ SOLN
1.0000 [IU] | SUBCUTANEOUS | Status: DC
  Administered 2017-06-13: 2 [IU] via SUBCUTANEOUS

## 2017-06-13 MED ORDER — METOPROLOL TARTRATE 5 MG/5ML IV SOLN
2.5000 mg | INTRAVENOUS | Status: DC | PRN
  Administered 2017-06-13: 5 mg via INTRAVENOUS
  Administered 2017-06-13: 2.5 mg via INTRAVENOUS
  Filled 2017-06-13 (×2): qty 5

## 2017-06-13 MED ORDER — APIXABAN 5 MG PO TABS
5.0000 mg | ORAL_TABLET | Freq: Two times a day (BID) | ORAL | Status: DC
  Administered 2017-06-13 – 2017-06-20 (×15): 5 mg via ORAL
  Filled 2017-06-13 (×15): qty 1

## 2017-06-13 MED ORDER — CHLORHEXIDINE GLUCONATE 0.12 % MT SOLN
15.0000 mL | Freq: Two times a day (BID) | OROMUCOSAL | Status: DC
  Administered 2017-06-13 – 2017-06-14 (×2): 15 mL via OROMUCOSAL
  Filled 2017-06-13 (×2): qty 15

## 2017-06-13 MED ORDER — DOCUSATE SODIUM 50 MG/5ML PO LIQD: 100.0000 mg | Freq: Two times a day (BID) | ORAL | Status: DC | PRN

## 2017-06-13 MED ORDER — ORAL CARE MOUTH RINSE: 15.0000 mL | Freq: Two times a day (BID) | OROMUCOSAL | Status: DC

## 2017-06-13 MED ORDER — DEXTROSE 10 % IV SOLN
INTRAVENOUS | Status: DC | PRN
  Administered 2017-06-13: 14:00:00 via INTRAVENOUS

## 2017-06-13 MED ORDER — INSULIN GLARGINE 100 UNIT/ML ~~LOC~~ SOLN
10.0000 [IU] | SUBCUTANEOUS | Status: DC
  Administered 2017-06-13: 10 [IU] via SUBCUTANEOUS
  Filled 2017-06-13: qty 0.1

## 2017-06-13 MED ORDER — POTASSIUM CHLORIDE 20 MEQ/15ML (10%) PO SOLN
40.0000 meq | Freq: Once | ORAL | Status: AC
  Administered 2017-06-13: 40 meq
  Filled 2017-06-13: qty 30

## 2017-06-13 MED ORDER — MIDAZOLAM HCL 2 MG/2ML IJ SOLN
INTRAMUSCULAR | Status: AC
  Filled 2017-06-13: qty 2

## 2017-06-13 MED ORDER — CHLORHEXIDINE GLUCONATE 0.12% ORAL RINSE (MEDLINE KIT)
15.0000 mL | Freq: Two times a day (BID) | OROMUCOSAL | Status: DC
  Administered 2017-06-13: 15 mL via OROMUCOSAL

## 2017-06-13 MED ORDER — DILTIAZEM LOAD VIA INFUSION
10.0000 mg | Freq: Once | INTRAVENOUS | Status: AC
  Administered 2017-06-13: 10 mg via INTRAVENOUS
  Filled 2017-06-13: qty 10

## 2017-06-13 MED ORDER — METOPROLOL TARTRATE 25 MG PO TABS
25.0000 mg | ORAL_TABLET | Freq: Two times a day (BID) | ORAL | Status: DC
  Administered 2017-06-13: 25 mg via ORAL
  Filled 2017-06-13 (×2): qty 1

## 2017-06-13 NOTE — Progress Notes (Signed)
Wasted 272mL Fentanyl in the sink witnessed by Arcola Jansky, RN.

## 2017-06-13 NOTE — Progress Notes (Signed)
[facility] Progress Note Patient Name: Anthony [name] DOB: 07-09-1950 MRN: [id number]   Date of Service  06/13/2017  HPI/Events of Note  Hypokalemia  eICU Interventions  Potassium replaced     Intervention Category Intermediate Interventions: Electrolyte abnormality - evaluation and management  DETERDING,ELIZABETH 06/13/2017, 5:35 AM

## 2017-06-13 NOTE — Procedures (Signed)
ELECTROENCEPHALOGRAM REPORT  Date of Study: 06/13/2017  Patient's Name: Anthony [name] MRN: [id number] Date of Birth: 1950/09/30  Referring Provider: Kerney Elbe, MD  Clinical History:  67 y.o. male with past medical history of diabetes insulin-dependent, hypertension and hyperlipidemia, CAD, atrial fibrillation, diastolic heart failure, obesity who presents with seizures.  Medications: Fentanyl Keppra Ativan Cardizem Metoprolol Eliquis  Technical Summary: A multichannel digital EEG recording measured by the international 10-20 system with electrodes applied with paste and impedances below 5000 ohms performed as portable with EKG monitoring in an intubated and sedated patient.  Hyperventilation and photic stimulation were not performed.  The digital EEG was referentially recorded, reformatted, and digitally filtered in a variety of bipolar and referential montages for optimal display.   Description: The patient is intubated and sedated on Fentanyl during the recording.  There is diffuse suppression of the EEG background with symmetric 2-3 Hz delta activity and no discernible posterior dominant rhythm.  Stage 2 sleep is not seen.  There were no epileptiform discharges or electrographic seizures seen.    EKG lead was unremarkable.  Impression: This EEG is abnormal due to diffuse slowing of the waking background.  Clinical Correlation of the above findings indicates diffuse cerebral dysfunction that is non-specific in etiology and can be seen with hypoxic/ischemic injury, toxic/metabolic encephalopathies, neurodegenerative disorders, or medication effect.  The absence of epileptiform discharges does not rule out a clinical diagnosis of epilepsy.  Clinical correlation is advised.   Metta Clines, DO

## 2017-06-13 NOTE — Progress Notes (Signed)
Pt CBG 262 and outside of current sliding scale. Dr. Elsworth Soho notified and advised to discontinue IV  Dextrose and to not administer insulin coverage at this time. Monitoring pt for hypo/hyperglycemia.

## 2017-06-13 NOTE — Procedures (Signed)
Extubation Procedure Note  Patient Details:   Name: Anthony [name] DOB: 1950-12-03 MRN: [id number]   Airway Documentation:  Airway 7.5 mm (Active)  Secured at (cm) 26 cm 06/13/2017 11:45 AM  Measured From Lips 06/13/2017 11:45 AM  Secured Location Right 06/13/2017 11:45 AM  Secured By [employer] 06/13/2017 11:45 AM  Tube Holder Repositioned Yes 06/13/2017 11:45 AM  Cuff Pressure (cm H2O) 26 cm H2O 06/13/2017  8:14 AM  Site Condition Dry 06/13/2017 11:45 AM   Positive cuff leak prior to extubation.   Evaluation  O2 sats: stable throughout Complications: No apparent complications Patient did tolerate procedure well. Bilateral Breath Sounds: Clear, Diminished   Yes  RN present during extubation. VS stable throughout with no distress or stridor noted. Placed pt on 4L [place].   [name] 06/13/2017, 2:52 PM

## 2017-06-13 NOTE — Progress Notes (Signed)
EEG completed, results pending. 

## 2017-06-13 NOTE — Progress Notes (Signed)
Patient transported to MRI & back to 0V37 without complications.

## 2017-06-13 NOTE — ED Notes (Signed)
Date and time results received: 06/13/17 12:35 AM (use smartphrase ".now" to insert current time)  Test: Glucose & lactic acid Critical Value: 661 & 3.1 respectively  Name of Provider Notified: Garlon Hatchet, primary nurse RN  Orders Received? Or Actions Taken?: awaiting response

## 2017-06-13 NOTE — Progress Notes (Signed)
Pt given beside swallow test with ice chips and water. Pt swallowed both without any complications and was able to clear own secretions from throat with a strong cough. Alva notified at 67. Pt due medications.

## 2017-06-13 NOTE — Progress Notes (Signed)
[facility] Pulmonary Diseases & Critical Care Medicine Progress Note  Patient Name: Anthony [name] MRN: [id number] DOB: 17-Oct-1950    ADMISSION DATE:  06/12/2017  REFERRING MD:  Dr. Marlou Sa  CHIEF COMPLAINT:  Seizures   ASSESSMENT/PLAN:  ASSESSMENT (included in the Hospital Problem List)  Principal Problem:   Seizures ([place]) Active Problems:   Right temporal lobe infarction   Atrial fibrillation with rapid ventricular response (HCC)   IDDM (insulin dependent diabetes mellitus) ([place])   Paranasal sinus disease   Acute renal failure superimposed on stage 3 chronic kidney disease ([place])    PLAN/RECOMMENDATIONS   Case discussed with Neurology. Will continue Eliquis.  Neurology help appreciated  Cardizem bolus and gtt for rate control  Takes Toprol XL 50 mg PO daily at home. Will start Lopressor liquid 25 mg OGT BID  Continue Keppra   NUTRITION: start TF. Dietary consult   DVT PROPHYLAXIS: Eliquis obviates need for additional chemoprophylaxis.  GI PROPHYLAXIS: Protonix.    SUBJECTIVE:   -Interim events: nurse reports that the patient has persistent tachycardia. Monitor shows atrial fibrillation with rapid ventricular response. Currently intubated and sedated. Off sedation. On insulin gtt. Yesterday, patient did have an anion gap with hyperglycemia and ketones in urine. Gap has closed overnight. On fentanyl gtt. MRI done this am shows punctate areas of R temporal infarct.  HISTORY OF PRESENT ILLNESS 67 year old male with new onset seizure. Intubated in ER. Past medical history significant for chronic atrial fibrillation on Eliquis, CAD, insulin-dependent diabetes mellitus, hypertension, CHF, OSA on CPAP, GERD, secondary hyperparathyroidism, CKD stage III.  The patient presents to the [facility] from home after having one episode of seizure witnessed by his wife no prior history of seizures documented.  En route to ED, EMS gave Versed 5 mg for seizure activity, blood pressure  noted to be 162/89, heart rate 150, respiratory rate 32 on 100% nonrebreather mask. Per ED documentation GCS on arrival was 13 patient was not responsive. Intubated in ED.  Per family pt has not been on his insulin for the last 4 days. Also complained of abdominal pain. Usual state of health prior to day of admission.   PAST MEDICAL/SURGICAL/SOCIAL/FAMILY HISTORY  Past Medical History:  Diagnosis Date  . Arthritis   . CAD in native artery    a. reported MI 2000, 2001, s/p stenting of the circumflex lesion extending into the second obtuse marginal branch with a drug-eluting stent in 2003. b. low risk nuc 2015.  Marland Kitchen Chronic atrial fibrillation ([place])   . Chronic combined systolic and diastolic CHF (congestive heart failure) ([place])    a. Previously diastolic, then EF 81-82% in 10/2016.  . CKD (chronic kidney disease), stage III ([place])   . Depression   . Diabetes mellitus   . Edema of extremities   . Erectile dysfunction   . GERD (gastroesophageal reflux disease)   . Hyperlipidemia   . Hypertension   . Myocardial infarction ([place]) 2000&2001  . Obesity   . Onychomycosis   . OSA (obstructive sleep apnea)   . Tubular adenoma of colon 10/2002    Past Surgical History:  Procedure Laterality Date  . ANGIOPLASTY  2001   stent x 1  . COLONOSCOPY  2000?   negative  . TOE AMPUTATION Left 2012    Social History   Tobacco Use  . Smoking status: Former Smoker    Packs/day: 0.25    Years: 45.00    Pack years: 11.25    Types: Cigarettes    Start date:  05/28/1967    Last attempt to quit: 06/11/2016    Years since quitting: 1.0  . Smokeless tobacco: Never Used  Substance Use Topics  . Alcohol use: No    Comment: quit in 1993    Family History  Problem Relation Age of Onset  . Heart attack Mother   . Heart attack Father   . Colon cancer Neg Hx   . Stomach cancer Neg Hx     Prior to Admission medications   Medication Sig Start Date End Date Taking? Authorizing Provider  apixaban  (ELIQUIS) 5 MG TABS tablet Take 1 tablet by mouth twice a day 03/04/17   Dorothy Spark, MD  calcitRIOL (ROCALTROL) 0.25 MCG capsule TAKE 0.25 MG BY MOUTH EVERY MON WED AND FRIDAY 08/06/16   [provider]  furosemide (LASIX) 40 MG tablet Take 1 tablet (40 mg total) by mouth 2 (two) times daily. 05/30/17 08/28/17  Deboraha Sprang, MD  gabapentin (NEURONTIN) 300 MG capsule TAKE ONE CAPSULE BY MOUTH AT BEDTIME 05/15/17   Bufford Lope, DO  insulin NPH-regular Human (NOVOLIN 70/30) (70-30) 100 UNIT/ML injection Inject 55 Units into the skin daily at 12 noon.    [provider]  losartan (COZAAR) 100 MG tablet Take 1 tablet (100 mg total) by mouth daily. 01/16/17   Sela Hilding, MD  metoprolol succinate (TOPROL-XL) 50 MG 24 hr tablet Take 1 tablet (50 mg total) 2 (two) times daily by mouth. Take with or immediately following a meal. 04/08/17   Isaiah Serge, NP  Multiple Vitamin (MULTIVITAMIN) tablet Take 1 tablet by mouth daily.      [provider]  nitroGLYCERIN (NITROSTAT) 0.4 MG SL tablet Place 0.4 mg under the tongue every 5 (five) minutes as needed for chest pain. If no relief by 3rd tab, call 911     [provider]  Omega 3 1000 MG CAPS Take 1,000 mg by mouth 3 (three) times daily.     [provider]  omeprazole (PRILOSEC) 40 MG capsule Take 1 capsule (40 mg total) by mouth daily. 06/05/17   Bonnita Hollow, MD  oxymorphone (OPANA) 5 MG tablet Take 5 mg by mouth every 6 (six) hours as needed for pain (max 3 tablets daily). Per Dr Alvin Critchley, MD  PARoxetine (PAXIL) 40 MG tablet Take 1 tablet (40 mg total) by mouth every morning. 12/30/16   Sela Hilding, MD  potassium chloride SA (K-DUR,KLOR-CON) 20 MEQ tablet Take 1 tablet (20 mEq total) by mouth 2 (two) times daily. 11/14/16   Lyda Jester M, PA-C  rosuvastatin (CRESTOR) 5 MG tablet Take 1 tablet (5 mg total) by mouth daily. 11/12/16   Consuelo Pandy, PA-C     Allergies  Allergen Reactions  . Enalapril Maleate Cough    Current Facility-Administered Medications:  .  bisacodyl (DULCOLAX) suppository 10 mg, 10 mg, Rectal, Daily PRN, Jennelle Human B, NP .  chlorhexidine gluconate (MEDLINE KIT) (PERIDEX) 0.12 % solution 15 mL, 15 mL, Mouth Rinse, BID, Scatliffe, Kristen D, MD, 15 mL at 06/13/17 0800 .  dextrose 5 %-0.45 % sodium chloride infusion, , Intravenous, Continuous, Kathi Ludwig, MD, Stopped at 06/13/17 0025 .  docusate (COLACE) 50 MG/5ML liquid 100 mg, 100 mg, Per Tube, BID PRN, Jennelle Human B, NP .  fentaNYL (SUBLIMAZE) bolus via infusion 25 mcg, 25 mcg, Intravenous, Q1H PRN, Harbrecht, Lawrence, MD .  fentaNYL 2523mg in NS 2559m(1044mml) infusion-PREMIX, 25-400 mcg/hr, Intravenous, Continuous, Harbrecht, Lawrence,  MD, Last Rate: 10 mL/hr at 06/13/17 0900, 100 mcg/hr at 06/13/17 0900 .  folic acid injection 1 mg, 1 mg, Intravenous, Daily, Simpson, Paula B, NP .  insulin regular (NOVOLIN R,HUMULIN R) 100 Units in sodium chloride 0.9 % 100 mL (1 Units/mL) infusion, , Intravenous, Continuous, Harbrecht, Lawrence, MD, Last Rate: 0.4 mL/hr at 06/13/17 0900 .  lactated ringers infusion, , Intravenous, Continuous, Harbrecht, Lawrence, MD .  LORazepam (ATIVAN) injection 2 mg, 2 mg, Intravenous, Q4H PRN, Jennelle Human B, NP .  MEDLINE mouth rinse, 15 mL, Mouth Rinse, 10 times per day, Scatliffe, Rise Paganini, MD, 15 mL at 06/13/17 0619 .  metoprolol tartrate (LOPRESSOR) injection 2.5-5 mg, 2.5-5 mg, Intravenous, Q3H PRN, Deterding, Guadelupe Sabin, MD, 5 mg at 06/13/17 0757 .  midazolam (VERSED) 2 MG/2ML injection, , , ,  .  midazolam (VERSED) injection 1 mg, 1 mg, Intravenous, Q15 min PRN, Kathi Ludwig, MD .  midazolam (VERSED) injection 1 mg, 1 mg, Intravenous, Q2H PRN, Kathi Ludwig, MD, 1 mg at 06/13/17 0406 .  ondansetron (ZOFRAN) injection 4 mg, 4 mg, Intravenous, Q6H PRN, Harbrecht, Lawrence, MD .  pantoprazole (PROTONIX)  injection 40 mg, 40 mg, Intravenous, QHS, Simpson, Paula B, NP, 40 mg at 06/13/17 0618 .  thiamine (B-1) injection 100 mg, 100 mg, Intravenous, Daily, Jennelle Human B, NP, 100 mg at 06/13/17 6948   OBJECTIVE:   VITAL SIGNS: BP (!) 126/99   Pulse 62   Temp 98.4 F (36.9 C)   Resp 18   Ht '6\' 1"'  (1.854 m)   Wt 101 kg (222 lb 10.6 oz)   SpO2 100%   BMI 29.38 kg/m  Vitals:   06/13/17 0700 06/13/17 0800 06/13/17 0814 06/13/17 0900  BP: 101/76 127/90 127/90 (!) 126/99  Pulse: 99 (!) 27 (!) 135 62  Resp: '18 18 18 18  ' Temp: 97.9 F (36.6 C) 98.1 F (36.7 C)  98.4 F (36.9 C)  TempSrc:      SpO2: 100% 100% 100% 100%  Weight:      Height:        HEMODYNAMICS:    VENTILATOR SETTINGS: Vent Mode: PRVC FiO2 (%):  [40 %-100 %] 40 % Set Rate:  [18 bmp] 18 bmp Vt Set:  [500 mL-640 mL] 640 mL PEEP:  [5 cmH20] 5 cmH20 Plateau Pressure:  [10 cmH20-15 cmH20] 12 cmH20  INTAKE / OUTPUT: I/O last 3 completed shifts: In: 5675.9 [I.V.:5075.9; IV Piggyback:600] Out: 425 [Urine:425]  PHYSICAL EXAMINATION: General:  Pleasant. Well-developed. Alert, awake and oriented to time, person and place. Cooperative. No acute distress. Normal affect. Head: normocephalic, atraumatic EYE: PERRLA, EOM intact, no scleral icterus, no pallor Nose: nares are patent. No polyps. No exudate. No sinus tenderness. Throat/Oral Cavity: Normal dentition. No oral thrush. No exudate. Mucous membranes are moist. No tonsillar enlargement. Neck: supple, no thyromegaly, no JVD, no lymphadenopathy. Trachea midline. Chest/Lung: symmetric in development and expansion. Good air entry. No use of accessory muscles. No crackles. No wheezes. No dullness to percussion. Heart: Regular S1 and S2 without murmur, rub or gallop. Abdomen: soft, nontender, nondistended. Normoactive bowel sounds. n rebound. No guarding. No hepatosplenomegaly. Extremities: no pedal edema, no cyanosis, no clubbing. 2+ DP pulses Lymphatic: no  cervical/axiallary/inguinal lymph nodes appreciated Musculoskeletal:  No deformities. Skin:  Warm. Dry. No rash or lesion. NEURO: cranial nerves II-XII are grossly symmetric and physiologic. Babinski absent. No sensory deficit. No motor deficit. DTR 2+ @ RUE, 2+ @ LUE 2+ @ RLL,  2+ @ LLL. No  cerebellar signs. Gait was not assessed.   LABS:  BMET Recent Labs  Lab 06/12/17 2115  06/12/17 2323 06/12/17 2342 06/13/17 0233  NA 131*   < > 133* 134* 137  K 4.4   < > 4.1 4.1 3.3*  CL 94*   < > 101 101 107  CO2 14*  --  17*  --  19*  BUN 27*   < > 25* 25* 25*  CREATININE 3.30*   < > 3.04* 2.80* 2.92*  GLUCOSE 663*   < > 661* 632* 409*   < > = values in this interval not displayed.    Electrolytes Recent Labs  Lab 06/12/17 2115 06/12/17 2323 06/13/17 0233  CALCIUM 8.8* 8.2* 8.0*  MG  --  1.8 1.9    CBC Recent Labs  Lab 06/12/17 2115 06/12/17 2124 06/12/17 2342 06/13/17 0233  WBC 8.5  --   --  6.6  HGB 14.9 16.3 13.3 13.2  HCT 44.4 48.0 39.0 37.9*  PLT 144*  --   --  121*    Coag's Recent Labs  Lab 06/12/17 2115  INR 1.33    Sepsis Markers Recent Labs  Lab 06/12/17 2323 06/13/17 0233  LATICACIDVEN 3.1* 2.3*  PROCALCITON 0.21 0.22    ABG Recent Labs  Lab 06/12/17 2156 06/13/17 0643  PHART 7.284* 7.348*  PCO2ART 35.4 33.9  PO2ART 331.0* 144.0*    Liver Enzymes Recent Labs  Lab 06/12/17 2115  AST 48*  ALT 34  ALKPHOS 120  BILITOT 1.1  ALBUMIN 3.0*    Cardiac Enzymes No results for input(s): TROPONINI, PROBNP in the last 168 hours.  Glucose Recent Labs  Lab 06/13/17 0227 06/13/17 0324 06/13/17 0430 06/13/17 0605 06/13/17 0731 06/13/17 0855  GLUCAP 366* 333* 225* 156* 93 96    Imaging Ct Head Wo Contrast  Result Date: 06/12/2017 CLINICAL DATA:  Seizure EXAM: CT HEAD WITHOUT CONTRAST TECHNIQUE: Contiguous axial images were obtained from the base of the skull through the vertex without intravenous contrast. COMPARISON:  None.  FINDINGS: Brain: Moderate atrophy. Moderate to marked small vessel ischemic changes of the white matter. Encephalomalacia in the left frontal lobe consistent with old infarct. Gyriform areas of increased density at the left insula and inferior frontal lobe. Ventricles are nonenlarged. No mass lesion. Vascular: No hyperdense vessels.  Carotid artery calcification. Skull: No fracture. Sinuses/Orbits: Mucosal thickening and retention cysts in the maxillary sinuses, mucosal thickening in the ethmoid sinuses. No acute orbital abnormality. Other: None IMPRESSION: 1. Gyriform areas of increased density along the left insula and inferior left frontal lobe, favored to represent cortical calcifications as opposed to hemorrhage, this could be related to sequela of prior necrosis or infection. Evidence of old left frontal lobe infarct. 2. Atrophy and fairly extensive small vessel ischemic changes of the white matter. 3. Sinus disease. Electronically Signed   By: Donavan Foil M.D.   On: 06/12/2017 22:11   Mr Brain Wo Contrast  Result Date: 06/13/2017 CLINICAL DATA:  67 y/o  M; new seizure. EXAM: MRI HEAD WITHOUT CONTRAST TECHNIQUE: Multiplanar, multiecho pulse sequences of the brain and surrounding structures were obtained without intravenous contrast. COMPARISON:  06/12/2017 CT head. FINDINGS: Brain: Punctate focus of reduced diffusion in right temporal periventricular white matter compatible with acute/early subacute infarction. No acute hemorrhage or mass effect. Chronic hemosiderin stained anterolateral frontal lobe infarction. Extensive patchynonspecific foci of T2 FLAIR hyperintense signal abnormality in subcortical and periventricular white matter are compatible withmoderate to severechronic microvascular ischemic changes for age.  Moderatebrain parenchymal volume loss. Small chronic lacunar infarct in left thalamus. No hydrocephalus, effacement of basilar cisterns, or extra-axial collection. Vascular: Normal flow  voids. Skull and upper cervical spine: Normal marrow signal. Sinuses/Orbits: Diffuse paranasal sinus mucosal thickening, partial opacification of ethmoid air cells, and left maxillary sinus mucous retention cyst. No abnormal signal of mastoid air cells. Bilateral intra-ocular lens replacement. Other: None. IMPRESSION: 1. Punctate acute/early subacute infarct within right temporal periventricular white matter. No associated hemorrhage or mass effect. 2. Moderate to severe chronic microvascular ischemic changes and moderate parenchymal volume loss of the brain. 3. Chronic hemosiderin stained infarction in left anterolateral frontal lobe. Small chronic lacunar infarct in left thalamus. 4. Moderate paranasal sinus disease. Electronically Signed   By: Kristine Garbe M.D.   On: 06/13/2017 06:02   Dg Chest Portable 1 View  Result Date: 06/12/2017 CLINICAL DATA:  Intubation. EXAM: PORTABLE CHEST 1 VIEW COMPARISON:  August 28, 2005 FINDINGS: The mediastinal contour is normal. The heart size is enlarged. Endotracheal tube is identified distal tip 3.9 cm from carina. There is no pleural line to suggest pneumothorax. There is mild interstitial edema. There is no focal pneumonia or pleural effusion. Nasogastric tube is identified distal tip in the proximal stomach. The bony structures are unremarkable. IMPRESSION: Endotracheal tube in good position. There is no pneumothorax. Nasogastric tube distal tip in the proximal stomach. Mild interstitial edema. Electronically Signed   By: Abelardo Diesel M.D.   On: 06/12/2017 21:50    CULTURES: Results for orders placed or performed during the hospital encounter of 06/12/17  MRSA PCR Screening     Status: None   Collection Time: 06/13/17  2:03 AM  Result Value Ref Range Status   MRSA by PCR NEGATIVE NEGATIVE Final    Comment:        The GeneXpert MRSA Assay (FDA approved for NASAL specimens only), is one component of a comprehensive MRSA colonization surveillance  program. It is not intended to diagnose MRSA infection nor to guide or monitor treatment for MRSA infections.     OTHER STUDIES:  -  ANTIBIOTICS: -  SIGNIFICANT EVENTS: 1/17 >> admitted with new onset seizures.  LINES/TUBES: -  My assessment, plan of care, findings, medications, side effects, etc. were discussed with:  Nurse  Respiratory Therapy  Theodosia Paling, PA for Neurology   Renee Pain, MD Board Certified by the ABIM, [place] Pager: [phone]  06/13/2017, 9:25 AM Cc: 60 min

## 2017-06-13 NOTE — Consult Note (Signed)
Neurology Consultation Referring Physician: Dr Laverta [place]  CC: Seizures  History is obtained from: Chart review   HPI: Anthony [name] is a 67 y.o. male with past medical history of diabetes insulin-dependent, hypertension and hyperlipidemia, CAD, atrial fibrillation, diastolic heart failure, obesity who presents with seizures.  According to the notes and EDP, the patient felt suddenly. Family's noticed that he was having generalized tonic-clonic seizure. The patient had another seizure en route to the emergency room and EMS gave 5 mg of Versed. On arrival to ER the patient had a GCS of 3, and was intubated for airway protection. He received 2 mg of Keppra IV loading dose. Glucose was in the 600s and patient noted to be in diabetes ketoacidosis vs HHS.  On initial assessment, patient is intubated and on propofol. However he is moving all 4 extremities briskly and on weaning will follow started to try to self extubate. The patient did not have any pupillary changes, nystagmus, facial twitching, jerking movements of any extremities to suggest he may have been in non convulsive status. Stat CT head was negative for any bleed.  WUJ:WJXBJY to obtain due to altered mental status.   Past Medical History:  Diagnosis Date  . Arthritis   . CAD in native artery    a. reported MI 2000, 2001, s/p stenting of the circumflex lesion extending into the second obtuse marginal branch with a drug-eluting stent in 2003. b. low risk nuc 2015.  Marland Kitchen Chronic atrial fibrillation ([place])   . Chronic combined systolic and diastolic CHF (congestive heart failure) ([place])    a. Previously diastolic, then EF 78-29% in 10/2016.  . CKD (chronic kidney disease), stage III ([place])   . Depression   . Diabetes mellitus   . Edema of extremities   . Erectile dysfunction   . GERD (gastroesophageal reflux disease)   . Hyperlipidemia   . Hypertension   . Myocardial infarction ([place]) 2000&2001  . Obesity   . Onychomycosis   . OSA  (obstructive sleep apnea)   . Tubular adenoma of colon 10/2002     Family History  Problem Relation Age of Onset  . Heart attack Mother   . Heart attack Father   . Colon cancer Neg Hx   . Stomach cancer Neg Hx      Social History:  reports that he quit smoking about a year ago. His smoking use included cigarettes. He started smoking about 50 years ago. He has a 11.25 pack-year smoking history. he has never used smokeless tobacco. He reports that he does not drink alcohol or use drugs.   Exam: Current vital signs: BP 104/90   Pulse (!) 129   Temp (!) 97.2 F (36.2 C)   Resp 18   Ht 6\' 1"  (1.854 m)   Wt 101 kg (222 lb 10.6 oz)   SpO2 98%   BMI 29.38 kg/m  Vital signs in last 24 hours: Temp:  [96.8 F (36 C)-98.4 F (36.9 C)] 97.2 F (36.2 C) (01/18 0330) Pulse Rate:  [27-175] 129 (01/18 0350) Resp:  [10-24] 18 (01/18 0330) BP: (84-160)/(60-139) 104/90 (01/18 0350) SpO2:  [95 %-100 %] 98 % (01/18 0350) FiO2 (%):  [40 %-100 %] 40 % (01/18 0350) Weight:  [101 kg (222 lb 10.6 oz)-103 kg (227 lb)] 101 kg (222 lb 10.6 oz) (01/18 0200)   Physical Exam  Constitutional: Appears well-developed and well-nourished.  Psych: Affect appropriate to situation Eyes: No scleral injection HENT: No OP obstrucion Head: Normocephalic.  Cardiovascular: Normal  rate and regular rhythm.  Respiratory: Effort normal, non-labored breathing GI: Soft.  No distension. There is no tenderness.  Skin: WDI  Neuro: Mental Status: The patient is intubated, however is briskly moving all 4 extremities and trying to self extubate. Cranial Nerves: II: Visual Fields : Unable to assess. Pupils are equal, round, and reactive to light.   III,IV, VI: No forced gaze deviation/systolic V: Unable to assess VII: Facial movement is symmetric.  VIII: Unable to assess X: Gag present Motor: Tone is normal. Bulk is normal. Appears to have normal strength in all four extremities.  Sensory: Address a painful  swelling on both sides Deep Tendon Reflexes: 2+ and symmetric in the biceps and patellae.  Plantars: Toes are downgoing bilaterally.  Cerebellar: FNF and HKS are intact bilaterally  ASSESSMENT AND PLAN  Seizures possible in the setting of diabetic ketoacidosis/HHS with glucose in 600s CT head showed no evidence of bleed MRI brain with and without contrast ordered and is pending Received Keppra IV load and started on 1g BID maintenance Routine EEG tomorrow  Patient is also intubated on sedation, moving briskly with all four extremities

## 2017-06-13 NOTE — Progress Notes (Signed)
PCP Social Note:   Chart reviewed this admission as patient is reportedly not alert enough to talk on the phone. Will attempt to visit next week if he is still admitted. Discussed with FPTS team (who would need to be consulted to assume care should he come stable for transfer out of the ICU [phone]), but patient has been undergoing workup for difficult to control afib as an outpatient, as well as possible amyloidosis as a cause of his CHF (attempted cardiac MRI but patient was unable to tolerate). He is followed by Dr. Caryl Comes with EP for this, who had planned for a BIV ICD on 07/11/17. When appropriate, I would recommend consulting either HF team or EP team or both given their familiarity with him. He is a pleasure to take care of and I look forward to visiting him as he recovers.   Ralene Ok, MD

## 2017-06-14 LAB — BASIC METABOLIC PANEL
Anion gap: 12 (ref 5–15)
Anion gap: 10 (ref 5–15)
Anion gap: 9 (ref 5–15)
Anion gap: 10 (ref 5–15)
Anion gap: 11 (ref 5–15)
Anion gap: 11 (ref 5–15)
BUN: 23 mg/dL — ABNORMAL HIGH (ref 6–20)
BUN: 22 mg/dL — ABNORMAL HIGH (ref 6–20)
BUN: 19 mg/dL (ref 6–20)
BUN: 18 mg/dL (ref 6–20)
BUN: 18 mg/dL (ref 6–20)
BUN: 18 mg/dL (ref 6–20)
CO2: 19 mmol/L — ABNORMAL LOW (ref 22–32)
CO2: 22 mmol/L (ref 22–32)
CO2: 21 mmol/L — ABNORMAL LOW (ref 22–32)
CO2: 21 mmol/L — ABNORMAL LOW (ref 22–32)
CO2: 20 mmol/L — ABNORMAL LOW (ref 22–32)
CO2: 19 mmol/L — ABNORMAL LOW (ref 22–32)
Calcium: 8.5 mg/dL — ABNORMAL LOW (ref 8.9–10.3)
Calcium: 8.6 mg/dL — ABNORMAL LOW (ref 8.9–10.3)
Calcium: 8.8 mg/dL — ABNORMAL LOW (ref 8.9–10.3)
Calcium: 8.7 mg/dL — ABNORMAL LOW (ref 8.9–10.3)
Calcium: 8.6 mg/dL — ABNORMAL LOW (ref 8.9–10.3)
Calcium: 8.8 mg/dL — ABNORMAL LOW (ref 8.9–10.3)
Chloride: 105 mmol/L (ref 101–111)
Chloride: 109 mmol/L (ref 101–111)
Chloride: 111 mmol/L (ref 101–111)
Chloride: 108 mmol/L (ref 101–111)
Chloride: 107 mmol/L (ref 101–111)
Chloride: 108 mmol/L (ref 101–111)
Creatinine, Ser: 2.94 mg/dL — ABNORMAL HIGH (ref 0.61–1.24)
Creatinine, Ser: 2.9 mg/dL — ABNORMAL HIGH (ref 0.61–1.24)
Creatinine, Ser: 2.65 mg/dL — ABNORMAL HIGH (ref 0.61–1.24)
Creatinine, Ser: 2.5 mg/dL — ABNORMAL HIGH (ref 0.61–1.24)
Creatinine, Ser: 2.52 mg/dL — ABNORMAL HIGH (ref 0.61–1.24)
Creatinine, Ser: 2.49 mg/dL — ABNORMAL HIGH (ref 0.61–1.24)
GFR calc Af Amer: 24 mL/min — ABNORMAL LOW (ref >60)
GFR calc Af Amer: 25 mL/min — ABNORMAL LOW (ref >60)
GFR calc Af Amer: 27 mL/min — ABNORMAL LOW (ref >60)
GFR calc Af Amer: 29 mL/min — ABNORMAL LOW (ref >60)
GFR calc Af Amer: 29 mL/min — ABNORMAL LOW (ref >60)
GFR calc Af Amer: 30 mL/min — ABNORMAL LOW (ref >60)
GFR calc non Af Amer: 21 mL/min — ABNORMAL LOW (ref >60)
GFR calc non Af Amer: 21 mL/min — ABNORMAL LOW (ref >60)
GFR calc non Af Amer: 24 mL/min — ABNORMAL LOW (ref >60)
GFR calc non Af Amer: 25 mL/min — ABNORMAL LOW (ref >60)
GFR calc non Af Amer: 25 mL/min — ABNORMAL LOW (ref >60)
GFR calc non Af Amer: 26 mL/min — ABNORMAL LOW (ref >60)
Glucose, Bld: 295 mg/dL — ABNORMAL HIGH (ref 65–99)
Glucose, Bld: 250 mg/dL — ABNORMAL HIGH (ref 65–99)
Glucose, Bld: 100 mg/dL — ABNORMAL HIGH (ref 65–99)
Glucose, Bld: 116 mg/dL — ABNORMAL HIGH (ref 65–99)
Glucose, Bld: 260 mg/dL — ABNORMAL HIGH (ref 65–99)
Glucose, Bld: 325 mg/dL — ABNORMAL HIGH (ref 65–99)
Potassium: 3.8 mmol/L (ref 3.5–5.1)
Potassium: 3.5 mmol/L (ref 3.5–5.1)
Potassium: 3.1 mmol/L — ABNORMAL LOW (ref 3.5–5.1)
Potassium: 3.2 mmol/L — ABNORMAL LOW (ref 3.5–5.1)
Potassium: 3.5 mmol/L (ref 3.5–5.1)
Potassium: 3.9 mmol/L (ref 3.5–5.1)
Sodium: 136 mmol/L (ref 135–145)
Sodium: 141 mmol/L (ref 135–145)
Sodium: 141 mmol/L (ref 135–145)
Sodium: 139 mmol/L (ref 135–145)
Sodium: 138 mmol/L (ref 135–145)
Sodium: 138 mmol/L (ref 135–145)

## 2017-06-14 LAB — GLUCOSE, CAPILLARY
Glucose-Capillary: 117 mg/dL — ABNORMAL HIGH (ref 65–99)
Glucose-Capillary: 206 mg/dL — ABNORMAL HIGH (ref 65–99)
Glucose-Capillary: 236 mg/dL — ABNORMAL HIGH (ref 65–99)
Glucose-Capillary: 271 mg/dL — ABNORMAL HIGH (ref 65–99)
Glucose-Capillary: 274 mg/dL — ABNORMAL HIGH (ref 65–99)
Glucose-Capillary: 351 mg/dL — ABNORMAL HIGH (ref 65–99)
Glucose-Capillary: 88 mg/dL (ref 65–99)

## 2017-06-14 LAB — PROCALCITONIN: Procalcitonin: 0.16 ng/mL

## 2017-06-14 MED ORDER — POTASSIUM CHLORIDE CRYS ER 20 MEQ PO TBCR
20.0000 meq | EXTENDED_RELEASE_TABLET | Freq: Two times a day (BID) | ORAL | Status: DC
  Administered 2017-06-14 – 2017-06-20 (×13): 20 meq via ORAL
  Filled 2017-06-14 (×13): qty 1

## 2017-06-14 MED ORDER — NITROGLYCERIN 0.4 MG SL SUBL: 0.4000 mg | SUBLINGUAL_TABLET | SUBLINGUAL | Status: DC | PRN

## 2017-06-14 MED ORDER — GABAPENTIN 300 MG PO CAPS
300.0000 mg | ORAL_CAPSULE | Freq: Every day | ORAL | Status: DC
  Administered 2017-06-14 – 2017-06-19 (×6): 300 mg via ORAL
  Filled 2017-06-14 (×6): qty 1

## 2017-06-14 MED ORDER — FUROSEMIDE 40 MG PO TABS
40.0000 mg | ORAL_TABLET | Freq: Two times a day (BID) | ORAL | Status: DC
  Administered 2017-06-14 – 2017-06-20 (×13): 40 mg via ORAL
  Filled 2017-06-14 (×13): qty 1

## 2017-06-14 MED ORDER — PAROXETINE HCL 20 MG PO TABS
40.0000 mg | ORAL_TABLET | Freq: Every morning | ORAL | Status: DC
  Administered 2017-06-14 – 2017-06-18 (×5): 40 mg via ORAL
  Filled 2017-06-14 (×5): qty 2

## 2017-06-14 MED ORDER — POTASSIUM CHLORIDE 20 MEQ PO PACK
40.0000 meq | PACK | Freq: Once | ORAL | Status: AC
  Administered 2017-06-14: 40 meq via ORAL
  Filled 2017-06-14: qty 2

## 2017-06-14 MED ORDER — ADULT MULTIVITAMIN W/MINERALS CH
1.0000 | ORAL_TABLET | Freq: Every day | ORAL | Status: DC
  Administered 2017-06-14 – 2017-06-20 (×7): 1 via ORAL
  Filled 2017-06-14 (×9): qty 1

## 2017-06-14 MED ORDER — METOPROLOL SUCCINATE ER 50 MG PO TB24
50.0000 mg | ORAL_TABLET | Freq: Two times a day (BID) | ORAL | Status: DC
  Administered 2017-06-14 – 2017-06-15 (×3): 50 mg via ORAL
  Filled 2017-06-14 (×3): qty 1

## 2017-06-14 MED ORDER — ROSUVASTATIN CALCIUM 5 MG PO TABS
5.0000 mg | ORAL_TABLET | Freq: Every day | ORAL | Status: DC
  Administered 2017-06-14 – 2017-06-17 (×4): 5 mg via ORAL
  Filled 2017-06-14 (×4): qty 1

## 2017-06-14 MED ORDER — DILTIAZEM HCL 100 MG IV SOLR
5.0000 mg/h | INTRAVENOUS | Status: DC
  Administered 2017-06-14: 7.5 mg/h via INTRAVENOUS
  Administered 2017-06-15: 5 mg/h via INTRAVENOUS
  Filled 2017-06-14 (×4): qty 100

## 2017-06-14 MED ORDER — INSULIN ASPART 100 UNIT/ML ~~LOC~~ SOLN
0.0000 [IU] | SUBCUTANEOUS | Status: DC
  Administered 2017-06-14: 5 [IU] via SUBCUTANEOUS
  Administered 2017-06-14: 8 [IU] via SUBCUTANEOUS
  Administered 2017-06-14: 5 [IU] via SUBCUTANEOUS
  Administered 2017-06-14: 15 [IU] via SUBCUTANEOUS
  Administered 2017-06-15: 2 [IU] via SUBCUTANEOUS
  Administered 2017-06-15: 8 [IU] via SUBCUTANEOUS
  Administered 2017-06-15: 5 [IU] via SUBCUTANEOUS

## 2017-06-14 NOTE — Progress Notes (Signed)
[facility] Pulmonary Diseases & Critical Care Medicine Progress Note  Patient Name: Anthony [name] MRN: [id number] DOB: [birth year]    ADMISSION DATE:  06/12/2017  REFERRING MD:  Dr. Marlou Sa  CHIEF COMPLAINT:  Seizures   ASSESSMENT/PLAN:  ASSESSMENT (included in the Hospital Problem List)  Principal Problem:   Seizures ([place]) Active Problems:   Right temporal lobe infarction   Atrial fibrillation with rapid ventricular response (HCC)   IDDM (insulin dependent diabetes mellitus) ([place])   Paranasal sinus disease   Acute renal failure superimposed on stage 3 chronic kidney disease ([place])    PLAN/RECOMMENDATIONS   Replete potassium  Speech evaluation.  PT/OT evaluation and treatment plan  Continue Eliquis.  Neurology help appreciated  Restart home PO meds. Stop Lopressor. Will try holding Cardizem gtt.  Continue Keppra.   NUTRITION: advance diet to 2000 cal ADA-compliant, as tolerated   DVT PROPHYLAXIS: Eliquis obviates need for additional chemoprophylaxis.  GI PROPHYLAXIS: Protonix.    SUBJECTIVE:   -Interim events: remains extubated. Dysarthric (baseline or still postictal?). Identifies [facility] hospital but answers "[phone]" for the year and cannot identify [facility]. follws commands briskly. On Cardizem gtt, BP and RVR controlled.  HISTORY OF PRESENT ILLNESS 67 year old male with new onset seizure. Intubated in ER. Past medical history significant for chronic atrial fibrillation on Eliquis, CAD, insulin-dependent diabetes mellitus, hypertension, CHF, OSA on CPAP, GERD, secondary hyperparathyroidism, CKD stage III.  The patient presents to the [facility] from home after having one episode of seizure witnessed by his wife no prior history of seizures documented.  En route to ED, EMS gave Versed 5 mg for seizure activity, blood pressure noted to be 162/89, heart rate 150, respiratory rate 32 on 100% nonrebreather mask. Per ED documentation GCS on arrival was 13 patient was  not responsive. Intubated in ED.  Per family pt has not been on his insulin for the last 4 days. Also complained of abdominal pain. Usual state of health prior to day of admission.   PAST MEDICAL/SURGICAL/SOCIAL/FAMILY HISTORY  Past Medical History:  Diagnosis Date  . Arthritis   . CAD in native artery    a. reported MI 2000, 2001, s/p stenting of the circumflex lesion extending into the second obtuse marginal branch with a drug-eluting stent in 2003. b. low risk nuc 2015.  Marland Kitchen Chronic atrial fibrillation ([place])   . Chronic combined systolic and diastolic CHF (congestive heart failure) ([place])    a. Previously diastolic, then EF [id number] in 10/2016.  . CKD (chronic kidney disease), stage III ([place])   . Depression   . Diabetes mellitus   . Edema of extremities   . Erectile dysfunction   . GERD (gastroesophageal reflux disease)   . Hyperlipidemia   . Hypertension   . Myocardial infarction ([place]) 2000&2001  . Obesity   . Onychomycosis   . OSA (obstructive sleep apnea)   . Tubular adenoma of colon 10/2002    Past Surgical History:  Procedure Laterality Date  . ANGIOPLASTY  2001   stent x 1  . COLONOSCOPY  2000?   negative  . TOE AMPUTATION Left 2012    Social History   Tobacco Use  . Smoking status: Former Smoker    Packs/day: 0.25    Years: 45.00    Pack years: 11.25    Types: Cigarettes    Start date: 05/28/1967    Last attempt to quit: 06/11/2016    Years since quitting: 1.0  . Smokeless tobacco: Never Used  Substance Use Topics  .  Alcohol use: No    Comment: quit in 1993    Family History  Problem Relation Age of Onset  . Heart attack Mother   . Heart attack Father   . Colon cancer Neg Hx   . Stomach cancer Neg Hx     Prior to Admission medications   Medication Sig Start Date End Date Taking? Authorizing Provider  apixaban (ELIQUIS) 5 MG TABS tablet Take 1 tablet by mouth twice a day 03/04/17   Dorothy Spark, MD  calcitRIOL (ROCALTROL) 0.25 MCG capsule TAKE  0.25 MG BY MOUTH EVERY MON WED AND FRIDAY 08/06/16   [provider]  furosemide (LASIX) 40 MG tablet Take 1 tablet (40 mg total) by mouth 2 (two) times daily. 05/30/17 08/28/17  Deboraha Sprang, MD  gabapentin (NEURONTIN) 300 MG capsule TAKE ONE CAPSULE BY MOUTH AT BEDTIME 05/15/17   Bufford Lope, DO  insulin NPH-regular Human (NOVOLIN 70/30) (70-30) 100 UNIT/ML injection Inject 55 Units into the skin daily at 12 noon.    [provider]  losartan (COZAAR) 100 MG tablet Take 1 tablet (100 mg total) by mouth daily. 01/16/17   Sela Hilding, MD  metoprolol succinate (TOPROL-XL) 50 MG 24 hr tablet Take 1 tablet (50 mg total) 2 (two) times daily by mouth. Take with or immediately following a meal. 04/08/17   Isaiah Serge, NP  Multiple Vitamin (MULTIVITAMIN) tablet Take 1 tablet by mouth daily.      [provider]  nitroGLYCERIN (NITROSTAT) 0.4 MG SL tablet Place 0.4 mg under the tongue every 5 (five) minutes as needed for chest pain. If no relief by 3rd tab, call 911     [provider]  Omega 3 1000 MG CAPS Take 1,000 mg by mouth 3 (three) times daily.     [provider]  omeprazole (PRILOSEC) 40 MG capsule Take 1 capsule (40 mg total) by mouth daily. 06/05/17   Bonnita Hollow, MD  oxymorphone (OPANA) 5 MG tablet Take 5 mg by mouth every 6 (six) hours as needed for pain (max 3 tablets daily). Per Dr Alvin Critchley, MD  PARoxetine (PAXIL) 40 MG tablet Take 1 tablet (40 mg total) by mouth every morning. 12/30/16   Sela Hilding, MD  potassium chloride SA (K-DUR,KLOR-CON) 20 MEQ tablet Take 1 tablet (20 mEq total) by mouth 2 (two) times daily. 11/14/16   Lyda Jester M, PA-C  rosuvastatin (CRESTOR) 5 MG tablet Take 1 tablet (5 mg total) by mouth daily. 11/12/16   Consuelo Pandy, PA-C    Allergies  Allergen Reactions  . Enalapril Maleate Cough    Current Facility-Administered Medications:  .  apixaban (ELIQUIS) tablet 5 mg, 5  mg, Oral, BID, Renee Pain, MD, 5 mg at 06/13/17 2154 .  bisacodyl (DULCOLAX) suppository 10 mg, 10 mg, Rectal, Daily PRN, Jennelle Human B, NP .  chlorhexidine (PERIDEX) 0.12 % solution 15 mL, 15 mL, Mouth Rinse, BID, Renee Pain, MD, 15 mL at 06/13/17 2155 .  [COMPLETED] diltiazem (CARDIZEM) 1 mg/mL load via infusion 10 mg, 10 mg, Intravenous, Once, 10 mg at 06/13/17 1056 **AND** diltiazem (CARDIZEM) 100 mg in dextrose 5 % 100 mL (1 mg/mL) infusion, 5-15 mg/hr, Intravenous, Continuous, Renee Pain, MD, Last Rate: 5 mL/hr at 06/14/17 0900, 5 mg/hr at 06/14/17 0900 .  docusate (COLACE) 50 MG/5ML liquid 100 mg, 100 mg, Oral, BID PRN, Rigoberto Noel, MD .  fentaNYL (SUBLIMAZE) bolus via infusion 25 mcg, 25 mcg,  Intravenous, Q1H PRN, Kathi Ludwig, MD .  folic acid injection 1 mg, 1 mg, Intravenous, Daily, Jennelle Human B, NP, 1 mg at 06/13/17 1015 .  insulin aspart (novoLOG) injection 0-15 Units, 0-15 Units, Subcutaneous, Q4H, Rigoberto Noel, MD, 5 Units at 06/14/17 0448 .  LORazepam (ATIVAN) injection 2 mg, 2 mg, Intravenous, Q4H PRN, Jennelle Human B, NP .  MEDLINE mouth rinse, 15 mL, Mouth Rinse, q12n4p, Renee Pain, MD .  metoprolol tartrate (LOPRESSOR) injection 2.5-5 mg, 2.5-5 mg, Intravenous, Q3H PRN, Deterding, Guadelupe Sabin, MD, 5 mg at 06/13/17 0757 .  metoprolol tartrate (LOPRESSOR) tablet 25 mg, 25 mg, Oral, BID, Scatliffe, Kristen D, MD, 25 mg at 06/13/17 2155 .  midazolam (VERSED) injection 1 mg, 1 mg, Intravenous, Q15 min PRN, Kathi Ludwig, MD .  midazolam (VERSED) injection 1 mg, 1 mg, Intravenous, Q2H PRN, Kathi Ludwig, MD, 1 mg at 06/14/17 0113 .  ondansetron (ZOFRAN) injection 4 mg, 4 mg, Intravenous, Q6H PRN, Harbrecht, Lawrence, MD .  pantoprazole (PROTONIX) injection 40 mg, 40 mg, Intravenous, QHS, Simpson, Paula B, NP, 40 mg at 06/13/17 2154 .  thiamine (B-1) injection 100 mg, 100 mg, Intravenous, Daily, Jennelle Human B, NP, 100 mg at  06/13/17 1015   OBJECTIVE:   VITAL SIGNS: BP 120/75   Pulse 95   Temp 98.4 F (36.9 C)   Resp (!) 23   Ht '6\' 1"'$  (1.854 m)   Wt 101.2 kg (223 lb 1.7 oz)   SpO2 94%   BMI 29.44 kg/m  Vitals:   06/14/17 0600 06/14/17 0700 06/14/17 0800 06/14/17 0900  BP: (!) 128/92 (!) 134/92 (!) 132/95 120/75  Pulse: 84 77 89 95  Resp: 13 (!) 23 19 (!) 23  Temp: 98.4 F (36.9 C) 98.4 F (36.9 C) 98.4 F (36.9 C) 98.4 F (36.9 C)  TempSrc:      SpO2: 96% 92% 95% 94%  Weight:      Height:        HEMODYNAMICS:    VENTILATOR SETTINGS: Vent Mode: PRVC FiO2 (%):  [40 %] 40 % Set Rate:  [18 bmp] 18 bmp Vt Set:  [640 mL] 640 mL PEEP:  [5 cmH20] 5 cmH20 Plateau Pressure:  [13 cmH20] 13 cmH20  INTAKE / OUTPUT: I/O last 3 completed shifts: In: 6280.2 [I.V.:5680.2; IV [id number] Out: 1350 [id number]  PHYSICAL EXAMINATION: General:  Pleasant. Well-developed. Alert, awake and oriented to time, person and place. Cooperative. No acute distress. Normal affect. Head: normocephalic, atraumatic EYE: PERRLA, EOM intact, no scleral icterus, no pallor Nose: nares are patent. No polyps. No exudate. No sinus tenderness. Throat/Oral Cavity: Normal dentition. No oral thrush. No exudate. Mucous membranes are moist. No tonsillar enlargement. Neck: supple, no thyromegaly, no JVD, no lymphadenopathy. Trachea midline. Chest/Lung: symmetric in development and expansion. Good air entry. No use of accessory muscles. No crackles. No wheezes. No dullness to percussion. Heart: Regular S1 and S2 without murmur, rub or gallop. Abdomen: soft, nontender, nondistended. Normoactive bowel sounds. n rebound. No guarding. No hepatosplenomegaly. Extremities: no pedal edema, no cyanosis, no clubbing. 2+ DP pulses Lymphatic: no cervical/axiallary/inguinal lymph nodes appreciated Musculoskeletal:  No deformities. Skin:  Warm. Dry. No rash or lesion. NEURO: cranial nerves II-XII are grossly symmetric and physiologic.  Babinski absent. No sensory deficit. No motor deficit. DTR 2+ @ RUE, 2+ @ LUE 2+ @ RLL,  2+ @ LLL. No cerebellar signs. Gait was not assessed.   LABS:  BMET Recent Labs  Lab 06/14/17 0021 06/14/17 0309 06/14/17 [id number]  NA 136 141 141  K 3.8 3.5 3.1*  CL 105 109 111  CO2 19* 22 21*  BUN 23* 22* 19  CREATININE 2.94* 2.90* 2.65*  GLUCOSE 295* 250* 100*    Electrolytes Recent Labs  Lab 06/12/17 2323 06/13/17 0233  06/14/17 0021 06/14/17 0309 06/14/17 0717  CALCIUM 8.2* 8.0*   < > 8.5* 8.6* 8.8*  MG 1.8 1.9  --   --   --   --    < > = values in this interval not displayed.    CBC Recent Labs  Lab 06/12/17 2115 06/12/17 2124 06/12/17 2342 06/13/17 0233  WBC 8.5  --   --  6.6  HGB 14.9 16.3 13.3 13.2  HCT 44.4 48.0 39.0 37.9*  PLT 144*  --   --  121*    Coag's Recent Labs  Lab 06/12/17 2115  INR 1.33    Sepsis Markers Recent Labs  Lab 06/12/17 2323 06/13/17 0233 06/14/17 0309  LATICACIDVEN 3.1* 2.3*  --   PROCALCITON 0.21 0.22 0.16    ABG Recent Labs  Lab 06/12/17 2156 06/13/17 0643  PHART 7.284* 7.348*  PCO2ART 35.4 33.9  PO2ART 331.0* 144.0*    Liver Enzymes Recent Labs  Lab 06/12/17 2115  AST 48*  ALT 34  ALKPHOS 120  BILITOT 1.1  ALBUMIN 3.0*    Cardiac Enzymes No results for input(s): TROPONINI, PROBNP in the last 168 hours.  Glucose Recent Labs  Lab 06/13/17 1223 06/13/17 1600 06/13/17 2052 06/14/17 0019 06/14/17 0435 06/14/17 0804  GLUCAP 150* 193* 262* 271* 206* 88    Imaging No results found.  CULTURES: Results for orders placed or performed during the hospital encounter of 06/12/17  MRSA PCR Screening     Status: None   Collection Time: 06/13/17  2:03 AM  Result Value Ref Range Status   MRSA by PCR NEGATIVE NEGATIVE Final    Comment:        The GeneXpert MRSA Assay (FDA approved for NASAL specimens only), is one component of a comprehensive MRSA colonization surveillance program. It is not intended to  diagnose MRSA infection nor to guide or monitor treatment for MRSA infections.     OTHER STUDIES:  -  ANTIBIOTICS: -  SIGNIFICANT EVENTS: 1/17 >> admitted with new onset seizures. Intubated for airway protection. 1/18 >> extubated  LINES/TUBES: -  My assessment, plan of care, findings, medications, side effects, etc. were discussed with:  Nurse   Renee Pain, MD Board Certified by the ABIM, [place] Pager: [phone]  06/14/2017, 9:04 AM

## 2017-06-14 NOTE — Evaluation (Signed)
Clinical/Bedside Swallow Evaluation Patient Details  Name: Anthony [name] MRN: [id number] Date of Birth: 12/16/1950  Today's Date: 06/14/2017 Time: SLP Start Time (ACUTE ONLY): 1000 SLP Stop Time (ACUTE ONLY): 1025 SLP Time Calculation (min) (ACUTE ONLY): 25 min  Past Medical History:  Past Medical History:  Diagnosis Date  . Arthritis   . CAD in native artery    a. reported MI 2000, 2001, s/p stenting of the circumflex lesion extending into the second obtuse marginal branch with a drug-eluting stent in 2003. b. low risk nuc 2015.  Marland Kitchen Chronic atrial fibrillation ([name])   . Chronic combined systolic and diastolic CHF (congestive heart failure) ([place])    a. Previously diastolic, then EF 123XX123 in 10/2016.  . CKD (chronic kidney disease), stage III ([place])   . Depression   . Diabetes mellitus   . Edema of extremities   . Erectile dysfunction   . GERD (gastroesophageal reflux disease)   . Hyperlipidemia   . Hypertension   . Myocardial infarction ([place]) 2000&2001  . Obesity   . Onychomycosis   . OSA (obstructive sleep apnea)   . Tubular adenoma of colon 10/2002   Past Surgical History:  Past Surgical History:  Procedure Laterality Date  . ANGIOPLASTY  2001   stent x 1  . COLONOSCOPY  2000?   negative  . TOE AMPUTATION Left 20181   HPI:  67 year old male with new onset seizure. Intubated in ER. Past medical history significant for chronic atrial fibrillation on Eliquis, CAD, insulin-dependent diabetes mellitus, hypertension, CHF, OSA on CPAP, GERD, secondary hyperparathyroidism, CKD stage III.MRI brain showed a subcentimeter focus of acute infarction in the white matter adjacent to the right lateral ventricle, likely small vessel disease however small cardioembolic stroke is also possible per MD. Intubated 06/12/17-06/13/17.   Assessment / Plan / Recommendation Clinical Impression  Pt presents with mild risk for aspiration in the setting of altered mental status and recent, brief  intubation. Pt is alert and cooperative, following all basic commands, oriented x4, however is distractible and exhibiting confusion, telling SLP "we might get drowned here." Passes 3oz water swallow challenge without overt signs of aspiration. Assessed medications whole in puree; pt retaining tablets orally requiring additional boluses of puree to transit. When tablet assessed whole with water, pt produced delayed coughing, concerning for decreased airway protection. Mastication of regular solid prolonged, with pt distracted and conversing with SLP despite cues to swallow first. Diffuse residue clears with several liquid washes, with immediate coughing noted. At this time greatest risk for aspiration appears to be mentation; suspect discoordination with mixed consistencies. Recommend dys 1 (puree), with thin liquids, straws OK, medications crushed in puree. Will follow up for tolerance and advancement as mentation improves, assessment of cognition.  SLP Visit Diagnosis: Dysphagia, oropharyngeal phase (R13.12)    Aspiration Risk  Mild aspiration risk    Diet Recommendation Dysphagia 1 (Puree);Thin liquid   Liquid Administration via: Cup;Straw Medication Administration: Crushed with puree Supervision: Patient able to self feed;Full supervision/cueing for compensatory strategies Compensations: Slow rate;Small sips/bites;Minimize environmental distractions Postural Changes: Seated upright at 90 degrees    Other  Recommendations Oral Care Recommendations: Oral care BID   Follow up Recommendations Other (comment)(TBD)      Frequency and Duration min 2x/week  1 week       Prognosis Prognosis for Safe Diet Advancement: Good Barriers to Reach Goals: Cognitive deficits      Swallow Study   General Date of Onset: 06/12/17 HPI: 67 year old male with new onset  seizure. Intubated in ER. Past medical history significant for chronic atrial fibrillation on Eliquis, CAD, insulin-dependent diabetes  mellitus, hypertension, CHF, OSA on CPAP, GERD, secondary hyperparathyroidism, CKD stage III.MRI brain showed a subcentimeter focus of acute infarction in the white matter adjacent to the right lateral ventricle, likely small vessel disease however small cardioembolic stroke is also possible per MD. Intubated 06/12/17-06/13/17. Type of Study: Bedside Swallow Evaluation Previous Swallow Assessment: none on file Diet Prior to this Study: Thin liquids Temperature Spikes Noted: No Respiratory Status: Nasal cannula History of Recent Intubation: Yes Length of Intubations (days): 1 days Date extubated: 06/13/17 Behavior/Cognition: Alert;Cooperative;Confused Oral Cavity Assessment: Within Functional Limits Oral Care Completed by SLP: No Oral Cavity - Dentition: Edentulous Vision: Functional for self-feeding Self-Feeding Abilities: Able to feed self Patient Positioning: Upright in bed Baseline Vocal Quality: Low vocal intensity Volitional Cough: Strong Volitional Swallow: Able to elicit    Oral/Motor/Sensory Function Overall Oral Motor/Sensory Function: Within functional limits   Ice Chips Ice chips: Within functional limits   Thin Liquid Thin Liquid: Within functional limits Presentation: Cup;Straw    Nectar Thick Nectar Thick Liquid: Not tested   Honey Thick Honey Thick Liquid: Not tested   Puree Puree: Within functional limits Presentation: Self Fed;Spoon   Solid   GO   Solid: Impaired Presentation: Self Fed Oral Phase Impairments: Impaired mastication Oral Phase Functional Implications: Oral residue;Impaired mastication;Prolonged oral transit Pharyngeal Phase Impairments: Cough - Delayed       Anthony Lever, MS, CCC-SLP Speech-Language Pathologist [phone]  Anthony [name] 06/14/2017,10:37 AM

## 2017-06-14 NOTE — Progress Notes (Signed)
EEG showed diffuse slowing, without electrographic seizures.   MRI brain showed a subcentimeter focus of acute infarction in the white matter adjacent to the right lateral ventricle, which appears more likely to be due to small vessel disease, although he does have atrial fibrillation and a small cardioembolic stroke is also possible. MRI brain also reveals moderate to severe chronic microvascular ischemic changes, moderate parenchymal volume loss of the brain, a chronic hemosiderin stained infarction in left anterolateral frontal lobe and a small chronic lacunar infarct in the left thalamus.   A/R: 1. New onset seizures. The chronic hemosiderin stained infarction may serve as a seizure focus. His severe hyperglycemia most likely triggered the seizure. Recommend glucose and electrolyte management per ICU protocol. Keppra can be discontinued as the seizure was most likely provoked by the patient's severe hyperglycemia. If seizures recur while normoglycemic, then will need to restart an anticonvulsant, which most likely would need to be continued long-term.  2. Acute periventricular white matter ischemic infarction. Felt most likely to be an acute focus of ischemia from chronic small vessel disease. A small cardioembolic stroke is also possible given the patient's atrial fibrillation. Recommend BP management and continuing anticoagulation with Eliquis.   Electronically signed: Dr. Kerney Elbe

## 2017-06-15 LAB — BASIC METABOLIC PANEL
Anion gap: 10 (ref 5–15)
Anion gap: 12 (ref 5–15)
Anion gap: 12 (ref 5–15)
BUN: 15 mg/dL (ref 6–20)
BUN: 15 mg/dL (ref 6–20)
BUN: 17 mg/dL (ref 6–20)
CO2: 20 mmol/L — ABNORMAL LOW (ref 22–32)
CO2: 22 mmol/L (ref 22–32)
CO2: 22 mmol/L (ref 22–32)
Calcium: 8.7 mg/dL — ABNORMAL LOW (ref 8.9–10.3)
Calcium: 8.9 mg/dL (ref 8.9–10.3)
Calcium: 8.9 mg/dL (ref 8.9–10.3)
Chloride: 107 mmol/L (ref 101–111)
Chloride: 108 mmol/L (ref 101–111)
Chloride: 108 mmol/L (ref 101–111)
Creatinine, Ser: 2.17 mg/dL — ABNORMAL HIGH (ref 0.61–1.24)
Creatinine, Ser: 2.23 mg/dL — ABNORMAL HIGH (ref 0.61–1.24)
Creatinine, Ser: 2.38 mg/dL — ABNORMAL HIGH (ref 0.61–1.24)
GFR calc Af Amer: 31 mL/min — ABNORMAL LOW (ref >60)
GFR calc Af Amer: 34 mL/min — ABNORMAL LOW (ref >60)
GFR calc Af Amer: 35 mL/min — ABNORMAL LOW (ref >60)
GFR calc non Af Amer: 27 mL/min — ABNORMAL LOW (ref >60)
GFR calc non Af Amer: 29 mL/min — ABNORMAL LOW (ref >60)
GFR calc non Af Amer: 30 mL/min — ABNORMAL LOW (ref >60)
Glucose, Bld: 133 mg/dL — ABNORMAL HIGH (ref 65–99)
Glucose, Bld: 227 mg/dL — ABNORMAL HIGH (ref 65–99)
Glucose, Bld: 99 mg/dL (ref 65–99)
Potassium: 3.1 mmol/L — ABNORMAL LOW (ref 3.5–5.1)
Potassium: 3.4 mmol/L — ABNORMAL LOW (ref 3.5–5.1)
Potassium: 3.4 mmol/L — ABNORMAL LOW (ref 3.5–5.1)
Sodium: 138 mmol/L (ref 135–145)
Sodium: 141 mmol/L (ref 135–145)
Sodium: 142 mmol/L (ref 135–145)

## 2017-06-15 LAB — GLUCOSE, CAPILLARY
Glucose-Capillary: 109 mg/dL — ABNORMAL HIGH (ref 65–99)
Glucose-Capillary: 124 mg/dL — ABNORMAL HIGH (ref 65–99)
Glucose-Capillary: 249 mg/dL — ABNORMAL HIGH (ref 65–99)
Glucose-Capillary: 347 mg/dL — ABNORMAL HIGH (ref 65–99)
Glucose-Capillary: 173 mg/dL — ABNORMAL HIGH (ref 65–99)

## 2017-06-15 MED ORDER — METOPROLOL SUCCINATE ER 50 MG PO TB24
50.0000 mg | ORAL_TABLET | Freq: Once | ORAL | Status: AC
  Administered 2017-06-15: 50 mg via ORAL
  Filled 2017-06-15: qty 1

## 2017-06-15 MED ORDER — INSULIN ASPART 100 UNIT/ML ~~LOC~~ SOLN
0.0000 [IU] | Freq: Three times a day (TID) | SUBCUTANEOUS | Status: DC
  Administered 2017-06-15 – 2017-06-16 (×2): 15 [IU] via SUBCUTANEOUS
  Administered 2017-06-16: 11 [IU] via SUBCUTANEOUS
  Administered 2017-06-16: 5 [IU] via SUBCUTANEOUS
  Administered 2017-06-17: 8 [IU] via SUBCUTANEOUS
  Administered 2017-06-17: 5 [IU] via SUBCUTANEOUS
  Administered 2017-06-17: 11 [IU] via SUBCUTANEOUS
  Administered 2017-06-18: 15 [IU] via SUBCUTANEOUS
  Administered 2017-06-18: 3 [IU] via SUBCUTANEOUS
  Administered 2017-06-18 – 2017-06-20 (×5): 5 [IU] via SUBCUTANEOUS
  Administered 2017-06-20: 3 [IU] via SUBCUTANEOUS

## 2017-06-15 MED ORDER — METOPROLOL SUCCINATE ER 100 MG PO TB24
100.0000 mg | ORAL_TABLET | Freq: Two times a day (BID) | ORAL | Status: DC
  Administered 2017-06-15 – 2017-06-17 (×4): 100 mg via ORAL
  Filled 2017-06-15: qty 1
  Filled 2017-06-15 (×2): qty 2
  Filled 2017-06-15: qty 1

## 2017-06-15 MED ORDER — HYDRALAZINE HCL 20 MG/ML IJ SOLN
10.0000 mg | INTRAMUSCULAR | Status: DC | PRN
  Administered 2017-06-15: 20 mg via INTRAVENOUS
  Filled 2017-06-15: qty 1

## 2017-06-15 NOTE — Progress Notes (Signed)
  Speech Language Pathology Treatment: Dysphagia  Patient Details Name: Anthony [name] MRN: [id number] DOB: 01-12-1951 Today's Date: 06/15/2017 Time: 1140-1150 SLP Time Calculation (min) (ACUTE ONLY): 10 min  Assessment / Plan / Recommendation Clinical Impression  Patient seen for follow-up for post-extubation dysphagia. He is fully alert, following all basic commands, remains mildly confused but oriented to self, year, location. Wife present at bedside. RN reports tolerating diet well and has been doing well with medication with liquids. Pt self-feed regular and pureed solids, thin liquids with no overt signs of aspiration. Mastication mildly prolonged for solids, however pt independently compensates by softening cracker with bite of applesauce. Maintains good attention to PO; distractibility has improved subjectively. Recommend advancing to soft solids (dys 3) with thin liquids, meds whole with liquid. Per pt's wife, he typically avoids tougher foods as he is edentulous. Will f/u briefly.    HPI HPI: 67 year old male with new onset seizure. Intubated in ER. Past medical history significant for chronic atrial fibrillation on Eliquis, CAD, insulin-dependent diabetes mellitus, hypertension, CHF, OSA on CPAP, GERD, secondary hyperparathyroidism, CKD stage III.MRI brain showed a subcentimeter focus of acute infarction in the white matter adjacent to the right lateral ventricle, likely small vessel disease however small cardioembolic stroke is also possible per MD. Intubated 06/12/17-06/13/17.      SLP Plan  Continue with current plan of care       Recommendations  Diet recommendations: Dysphagia 3 (mechanical soft);Thin liquid Liquids provided via: Cup;Straw Medication Administration: Whole meds with liquid Supervision: Patient able to self feed;Intermittent supervision to cue for compensatory strategies Compensations: Slow rate;Small sips/bites;Minimize environmental distractions Postural  Changes and/or Swallow Maneuvers: Seated upright 90 degrees                Oral Care Recommendations: Oral care BID Follow up Recommendations: Other (comment)(none for swallow) SLP Visit Diagnosis: Dysphagia, oropharyngeal phase (R13.12) Plan: Continue with current plan of care       [place], [place], [place] Speech-Language Pathologist [phone]  Aliene Altes 06/15/2017, 1:03 PM

## 2017-06-15 NOTE — Progress Notes (Addendum)
[facility] Pulmonary Diseases & Critical Care Medicine Progress Note  Patient Name: Anthony [name] MRN: [id number] DOB: 13-Jun-1950    ADMISSION DATE:  06/12/2017  REFERRING MD:  Dr. Marlou Sa  CHIEF COMPLAINT:  Seizures   ASSESSMENT/PLAN:  ASSESSMENT (included in the Hospital Problem List)  Principal Problem:   Seizures ([place]) Active Problems:   Right temporal lobe infarction   Atrial fibrillation with rapid ventricular response (HCC)   IDDM (insulin dependent diabetes mellitus) ([place])   Paranasal sinus disease   Acute renal failure superimposed on stage 3 chronic kidney disease ([place])    PLAN/RECOMMENDATIONS   Replete potassium  Speech evaluation.   PT/OT evaluation and treatment plan  Stop thiamine and folate IV. Restart multivitamin.  Continue Eliquis.  Neurology help appreciated  Increase Toprol XL to 100 mg PO BID. Will try holding Cardizem gtt.  Continue Keppra.   NUTRITION: advance diet to 2000 cal ADA-compliant, as tolerated   DVT PROPHYLAXIS: Eliquis obviates need for additional chemoprophylaxis.  GI PROPHYLAXIS: Protonix.    SUBJECTIVE:   -Interim events: remains extubated. Dysarthric (baseline or still postictal?). Less dysarthric today. Oriented to time, person and place. follows commands briskly. Back on Cardizem gtt, BP and RVR controlled. Cr down to 2.17.  HISTORY OF PRESENT ILLNESS 67 year old male with new onset seizure. Intubated in ER. Past medical history significant for chronic atrial fibrillation on Eliquis, CAD, insulin-dependent diabetes mellitus, hypertension, CHF, OSA on CPAP, GERD, secondary hyperparathyroidism, CKD stage III.  The patient presents to the [facility] from home after having one episode of seizure witnessed by his wife no prior history of seizures documented.  En route to ED, EMS gave Versed 5 mg for seizure activity, blood pressure noted to be 162/89, heart rate 150, respiratory rate 32 on 100% nonrebreather mask. Per ED  documentation GCS on arrival was 13 patient was not responsive. Intubated in ED.  Per family pt has not been on his insulin for the last 4 days. Also complained of abdominal pain. Usual state of health prior to day of admission.   PAST MEDICAL/SURGICAL/SOCIAL/FAMILY HISTORY  Past Medical History:  Diagnosis Date  . Arthritis   . CAD in native artery    a. reported MI 2000, 2001, s/p stenting of the circumflex lesion extending into the second obtuse marginal branch with a drug-eluting stent in 2003. b. low risk nuc 2015.  Marland Kitchen Chronic atrial fibrillation ([place])   . Chronic combined systolic and diastolic CHF (congestive heart failure) ([place])    a. Previously diastolic, then EF 94-17% in 10/2016.  . CKD (chronic kidney disease), stage III ([place])   . Depression   . Diabetes mellitus   . Edema of extremities   . Erectile dysfunction   . GERD (gastroesophageal reflux disease)   . Hyperlipidemia   . Hypertension   . Myocardial infarction ([place]) 2000&2001  . Obesity   . Onychomycosis   . OSA (obstructive sleep apnea)   . Tubular adenoma of colon 10/2002    Past Surgical History:  Procedure Laterality Date  . ANGIOPLASTY  2001   stent x 1  . COLONOSCOPY  2000?   negative  . TOE AMPUTATION Left 2012    Social History   Tobacco Use  . Smoking status: Former Smoker    Packs/day: 0.25    Years: 45.00    Pack years: 11.25    Types: Cigarettes    Start date: 05/28/1967    Last attempt to quit: 06/11/2016    Years since quitting: 1.0  .  Smokeless tobacco: Never Used  Substance Use Topics  . Alcohol use: No    Comment: quit in 1993    Family History  Problem Relation Age of Onset  . Heart attack Mother   . Heart attack Father   . Colon cancer Neg Hx   . Stomach cancer Neg Hx     Prior to Admission medications   Medication Sig Start Date End Date Taking? Authorizing Provider  apixaban (ELIQUIS) 5 MG TABS tablet Take 1 tablet by mouth twice a day 03/04/17   Dorothy Spark, MD   calcitRIOL (ROCALTROL) 0.25 MCG capsule TAKE 0.25 MG BY MOUTH EVERY MON WED AND FRIDAY 08/06/16   [provider]  furosemide (LASIX) 40 MG tablet Take 1 tablet (40 mg total) by mouth 2 (two) times daily. 05/30/17 08/28/17  Deboraha Sprang, MD  gabapentin (NEURONTIN) 300 MG capsule TAKE ONE CAPSULE BY MOUTH AT BEDTIME 05/15/17   Bufford Lope, DO  insulin NPH-regular Human (NOVOLIN 70/30) (70-30) 100 UNIT/ML injection Inject 55 Units into the skin daily at 12 noon.    [provider]  losartan (COZAAR) 100 MG tablet Take 1 tablet (100 mg total) by mouth daily. 01/16/17   Sela Hilding, MD  metoprolol succinate (TOPROL-XL) 50 MG 24 hr tablet Take 1 tablet (50 mg total) 2 (two) times daily by mouth. Take with or immediately following a meal. 04/08/17   Isaiah Serge, NP  Multiple Vitamin (MULTIVITAMIN) tablet Take 1 tablet by mouth daily.      [provider]  nitroGLYCERIN (NITROSTAT) 0.4 MG SL tablet Place 0.4 mg under the tongue every 5 (five) minutes as needed for chest pain. If no relief by 3rd tab, call 911     [provider]  Omega 3 1000 MG CAPS Take 1,000 mg by mouth 3 (three) times daily.     [provider]  omeprazole (PRILOSEC) 40 MG capsule Take 1 capsule (40 mg total) by mouth daily. 06/05/17   Bonnita Hollow, MD  oxymorphone (OPANA) 5 MG tablet Take 5 mg by mouth every 6 (six) hours as needed for pain (max 3 tablets daily). Per Dr Alvin Critchley, MD  PARoxetine (PAXIL) 40 MG tablet Take 1 tablet (40 mg total) by mouth every morning. 12/30/16   Sela Hilding, MD  potassium chloride SA (K-DUR,KLOR-CON) 20 MEQ tablet Take 1 tablet (20 mEq total) by mouth 2 (two) times daily. 11/14/16   Lyda Jester M, PA-C  rosuvastatin (CRESTOR) 5 MG tablet Take 1 tablet (5 mg total) by mouth daily. 11/12/16   Consuelo Pandy, PA-C    Allergies  Allergen Reactions  . Enalapril Maleate Cough    Current Facility-Administered  Medications:  .  apixaban (ELIQUIS) tablet 5 mg, 5 mg, Oral, BID, Renee Pain, MD, 5 mg at 06/15/17 0924 .  bisacodyl (DULCOLAX) suppository 10 mg, 10 mg, Rectal, Daily PRN, Jennelle Human B, NP .  diltiazem (CARDIZEM) 100 mg in dextrose 5 % 100 mL (1 mg/mL) infusion, 5-15 mg/hr, Intravenous, Titrated, Renee Pain, MD, Last Rate: 5 mL/hr at 06/15/17 0925, 5 mg/hr at 06/15/17 0925 .  docusate (COLACE) 50 MG/5ML liquid 100 mg, 100 mg, Oral, BID PRN, Rigoberto Noel, MD .  fentaNYL (SUBLIMAZE) bolus via infusion 25 mcg, 25 mcg, Intravenous, Q1H PRN, Kathi Ludwig, MD .  folic acid injection 1 mg, 1 mg, Intravenous, Daily, Jennelle Human B, NP, 1 mg at 06/14/17 1142 .  furosemide (LASIX) tablet 40  mg, 40 mg, Oral, BID, Renee Pain, MD, 40 mg at 06/15/17 0805 .  gabapentin (NEURONTIN) capsule 300 mg, 300 mg, Oral, QHS, Renee Pain, MD, 300 mg at 06/14/17 2102 .  insulin aspart (novoLOG) injection 0-15 Units, 0-15 Units, Subcutaneous, Q4H, Rigoberto Noel, MD, 2 Units at 06/15/17 0805 .  LORazepam (ATIVAN) injection 2 mg, 2 mg, Intravenous, Q4H PRN, Jennelle Human B, NP .  metoprolol succinate (TOPROL-XL) 24 hr tablet 100 mg, 100 mg, Oral, BID WC, Renee Pain, MD .  metoprolol succinate (TOPROL-XL) 24 hr tablet 50 mg, 50 mg, Oral, Once, Renee Pain, MD .  midazolam (VERSED) injection 1 mg, 1 mg, Intravenous, Q15 min PRN, Kathi Ludwig, MD .  midazolam (VERSED) injection 1 mg, 1 mg, Intravenous, Q2H PRN, Kathi Ludwig, MD, 1 mg at 06/15/17 0229 .  multivitamin with minerals tablet 1 tablet, 1 tablet, Oral, Daily, Renee Pain, MD, 1 tablet at 06/15/17 0925 .  nitroGLYCERIN (NITROSTAT) SL tablet 0.4 mg, 0.4 mg, Sublingual, Q5 min PRN, Renee Pain, MD .  ondansetron [facility]) injection 4 mg, 4 mg, Intravenous, Q6H PRN, Kathi Ludwig, MD .  pantoprazole (PROTONIX) injection 40 mg, 40 mg, Intravenous, QHS, Simpson, Paula B, NP, 40 mg at 06/14/17  2101 .  PARoxetine (PAXIL) tablet 40 mg, 40 mg, Oral, q morning - 10a, Renee Pain, MD, 40 mg at 06/15/17 0924 .  potassium chloride SA (K-DUR,KLOR-CON) CR tablet 20 mEq, 20 mEq, Oral, BID, Renee Pain, MD, 20 mEq at 06/15/17 0924 .  rosuvastatin (CRESTOR) tablet 5 mg, 5 mg, Oral, Daily, Renee Pain, MD, 5 mg at 06/15/17 0924 .  thiamine (B-1) injection 100 mg, 100 mg, Intravenous, Daily, Jennelle Human B, NP, 100 mg at 06/15/17 0925   OBJECTIVE:   VITAL SIGNS: BP (!) 157/95   Pulse 86   Temp (!) 97.5 F (36.4 C) (Axillary)   Resp 17   Ht 6\' 1"  (1.854 m)   Wt 101.2 kg (223 lb 1.7 oz)   SpO2 93%   BMI 29.44 kg/m  Vitals:   06/15/17 0600 06/15/17 0700 06/15/17 0800 06/15/17 0900  BP: (!) 139/96 (!) 146/105 (!) 153/109 (!) 157/95  Pulse: 100 96 82 86  Resp: (!) 21 19 (!) 25 17  Temp:   (!) 97.5 F (36.4 C)   TempSrc:   Axillary   SpO2: 92% 96% 94% 93%  Weight:      Height:        HEMODYNAMICS:    VENTILATOR SETTINGS:    INTAKE / OUTPUT: I/O last 3 completed shifts: In: 555.8 [P.O.:240; I.V.:315.8] Out: 3015 [Urine:3015]  PHYSICAL EXAMINATION: General:  Pleasant. Well-developed. Alert, awake and oriented to time, person and place. Cooperative. No acute distress. Normal affect. Head: normocephalic, atraumatic EYE: PERRLA, EOM intact, no scleral icterus, no pallor Nose: nares are patent. No polyps. No exudate. No sinus tenderness. Throat/Oral Cavity: Normal dentition. No oral thrush. No exudate. Mucous membranes are moist. No tonsillar enlargement. Neck: supple, no thyromegaly, no JVD, no lymphadenopathy. Trachea midline. Chest/Lung: symmetric in development and expansion. Good air entry. No use of accessory muscles. No crackles. No wheezes. No dullness to percussion. Heart: Regular S1 and S2 without murmur, rub or gallop. Abdomen: soft, nontender, nondistended. Normoactive bowel sounds. n rebound. No guarding. No hepatosplenomegaly. Extremities: no pedal  edema, no cyanosis, no clubbing. 2+ DP pulses Lymphatic: no cervical/axiallary/inguinal lymph nodes appreciated Musculoskeletal:  No deformities. Skin:  Warm. Dry. No rash or lesion. NEURO: cranial nerves II-XII are grossly symmetric and physiologic.  Babinski absent. No sensory deficit. No motor deficit. DTR 2+ @ RUE, 2+ @ LUE 2+ @ RLL,  2+ @ LLL. No cerebellar signs. Gait was not assessed.   LABS:  BMET Recent Labs  Lab 06/14/17 2331 06/15/17 0437 06/15/17 0725  NA 138 142 141  K 3.4* 3.1* 3.4*  CL 108 108 107  CO2 20* 22 22  BUN 17 15 15   CREATININE 2.38* 2.23* 2.17*  GLUCOSE 227* 99 133*    Electrolytes Recent Labs  Lab 06/12/17 2323 06/13/17 0233  06/14/17 2331 06/15/17 0437 06/15/17 0725  CALCIUM 8.2* 8.0*   < > 8.7* 8.9 8.9  MG 1.8 1.9  --   --   --   --    < > = values in this interval not displayed.    CBC Recent Labs  Lab 06/12/17 2115 06/12/17 2124 06/12/17 2342 06/13/17 0233  WBC 8.5  --   --  6.6  HGB 14.9 16.3 13.3 13.2  HCT 44.4 48.0 39.0 37.9*  PLT 144*  --   --  121*    Coag's Recent Labs  Lab 06/12/17 2115  INR 1.33    Sepsis Markers Recent Labs  Lab 06/12/17 2323 06/13/17 0233 06/14/17 0309  LATICACIDVEN 3.1* 2.3*  --   PROCALCITON 0.21 0.22 0.16    ABG Recent Labs  Lab 06/12/17 2156 06/13/17 0643  PHART 7.284* 7.348*  PCO2ART 35.4 33.9  PO2ART 331.0* 144.0*    Liver Enzymes Recent Labs  Lab 06/12/17 2115  AST 48*  ALT 34  ALKPHOS 120  BILITOT 1.1  ALBUMIN 3.0*    Cardiac Enzymes No results for input(s): TROPONINI, PROBNP in the last 168 hours.  Glucose Recent Labs  Lab 06/14/17 1143 06/14/17 1527 06/14/17 1945 06/14/17 2337 06/15/17 0326 06/15/17 0717  GLUCAP 117* 236* 351* 274* 109* 124*    Imaging No results found.  CULTURES: Results for orders placed or performed during the hospital encounter of 06/12/17  MRSA PCR Screening     Status: None   Collection Time: 06/13/17  2:03 AM  Result  Value Ref Range Status   MRSA by PCR NEGATIVE NEGATIVE Final    Comment:        The GeneXpert MRSA Assay (FDA approved for NASAL specimens only), is one component of a comprehensive MRSA colonization surveillance program. It is not intended to diagnose MRSA infection nor to guide or monitor treatment for MRSA infections.     OTHER STUDIES:  -  ANTIBIOTICS: -  SIGNIFICANT EVENTS: 1/17 >> admitted with new onset seizures. Intubated for airway protection. 1/18 >> extubated  LINES/TUBES: -  My assessment, plan of care, findings, medications, side effects, etc. were discussed with:  Nurse   Renee Pain, MD Board Certified by the ABIM, [place] Pager: [phone]  06/15/2017, 9:45 AM

## 2017-06-15 NOTE — Evaluation (Signed)
Occupational Therapy Evaluation Patient Details Name: Anthony [name] MRN: [id number] DOB: 10-12-1950 Today's Date: 06/15/2017    History of Present Illness 67 year old male with new onset seizure. Intubated in ER. Past medical history significant for chronic atrial fibrillation on Eliquis, CAD, insulin-dependent diabetes mellitus, hypertension, CHF, OSA on CPAP, GERD, secondary hyperparathyroidism, CKD stage III. MRI brain showed a subcentimeter focus of acute infarction in the white matter adjacent to the right lateral ventricle, likely small vessel disease however small cardioembolic stroke is also possible per MD. Intubated 06/12/17-06/13/17.    Clinical Impression   PTA, pt was living with his wife and was independent with ADLs. Pt currently requiring Max A +2 for LB ADLs, Max A +2 for toileting, Min A-Mod A +2 for functional mobility. Pt presenting with decreased balance, awareness, cognition, and ROM. Pt motivated to participate in therapy and return to PLOF. Pt would benefit from further acute OT to facilitate safe dc. Recommend dc to CIR for intensive OT to optimize safety and independence with ADLs and functional mobility before returning home.      Follow Up Recommendations  CIR;Supervision/Assistance - 24 hour    Equipment Recommendations  Other (comment)(Defer to next venue)    Recommendations for Other Services PT consult;Rehab consult;Speech consult     Precautions / Restrictions Precautions Precautions: Fall Restrictions Weight Bearing Restrictions: No      Mobility Bed Mobility Overal bed mobility: Needs Assistance Bed Mobility: Supine to Sit     Supine to sit: Min guard;+2 for safety/equipment     General bed mobility comments: Min Guard for safety. Required significant amount of time and cues for sequencing  Transfers Overall transfer level: Needs assistance Equipment used: 2 person hand held assist;Rolling walker (2 wheeled) Transfers: Sit to/from  [facility] Sit to Stand: Min assist;+2 physical assistance;Mod assist Stand pivot transfers: Mod assist;+2 physical assistance       General transfer comment: Min A +2 for EOB for balance. Mod A for BSC to power up into standing and maintain balance with posteior lean. Mod A +2 for SPT and increased cues for sequencing    Balance Overall balance assessment: Needs assistance Sitting-balance support: No upper extremity supported;Feet supported Sitting balance-Leahy Scale: Fair Sitting balance - Comments: Posteior lean during LB dressing Postural control: Posterior lean Standing balance support: No upper extremity supported;During functional activity Standing balance-Leahy Scale: Poor Standing balance comment: Required UE support or physical A                            ADL either performed or assessed with clinical judgement   ADL Overall ADL's : Needs assistance/impaired Eating/Feeding: Set up;Supervision/ safety;Sitting   Grooming: Set up;Supervision/safety;Sitting   Upper Body Bathing: Minimal assistance;Sitting   Lower Body Bathing: Maximal assistance;Sit to/from stand Lower Body Bathing Details (indicate cue type and reason): Max A for standing balance with dynamic task Upper Body Dressing : Minimal assistance;Sitting Upper Body Dressing Details (indicate cue type and reason): donned new gown Lower Body Dressing: Maximal assistance;Sit to/from stand Lower Body Dressing Details (indicate cue type and reason): Pt donning R sock with assistance for sitting balance and manininging ankle at knee. Pt required therapist to initate and don sock over toes, then pt bringing sock over heel. Requiring significant amount of time for task and processing.  Toilet Transfer: Moderate assistance;+2 for physical assistance;Stand-pivot;BSC;Cueing for safety;Cueing for sequencing Toilet Transfer Details (indicate cue type and reason): Mod A +2 for stand pivot  to toilet  for safety and balance. Pt requiring cues for sequencing.  Toileting- Clothing Manipulation and Hygiene: Maximal assistance;+2 for safety/equipment;Sit to/from stand;Cueing for sequencing Toileting - Clothing Manipulation Details (indicate cue type and reason): Pt with significant posterior lean during toileting.      Functional mobility during ADLs: Minimal assistance;Rolling walker;+2 for physical assistance General ADL Comments: Pt demonstraitng decreased funcitonal performance. Presenting with decreased balance, cognition, and ROM. Pt motivated to participate in therapy and return to PLOF     Vision Baseline Vision/History: Wears glasses Wears Glasses: Reading only Patient Visual Report: No change from baseline Vision Assessment?: Yes Eye Alignment: Within Functional Limits Ocular Range of Motion: Within Functional Limits Alignment/Gaze Preference: Within Defined Limits Tracking/Visual Pursuits: Able to track stimulus in all quads without difficulty Convergence: Within functional limits     Perception     Praxis      Pertinent Vitals/Pain Pain Assessment: Faces Faces Pain Scale: Hurts little more Pain Location: R foot Pain Descriptors / Indicators: Discomfort;Grimacing(with WBing) Pain Intervention(s): Monitored during session;Repositioned     Hand Dominance Right   Extremity/Trunk Assessment Upper Extremity Assessment Upper Extremity Assessment: Overall WFL for tasks assessed   Lower Extremity Assessment Lower Extremity Assessment: Defer to PT evaluation   Cervical / Trunk Assessment Cervical / Trunk Assessment: Other exceptions Cervical / Trunk Exceptions: Poor trunk control. Noted posterior lean during LB dressing at EOB and in standing.   Communication Communication Communication: Other (comment)(low volume and slow to answer)   Cognition Arousal/Alertness: Awake/alert Behavior During Therapy: WFL for tasks assessed/performed Overall Cognitive Status:  Impaired/Different from baseline Area of Impairment: Following commands;Safety/judgement;Problem solving;Awareness;Attention                   Current Attention Level: Sustained   Following Commands: Follows one step commands with increased time Safety/Judgement: Decreased awareness of safety Awareness: Emergent Problem Solving: Slow processing;Requires verbal cues;Requires tactile cues General Comments: Pt requiring increased time and cues during ADLs and funcitonal mobility. Pt presenting with slower processing as seen during ADLs and when answering questions. Noted pt would mimic conversation (including words or parts of sentences). For example, therapist saying "I think you need some rehab" and pt repornding "I think I need..want to walk."   General Comments       Exercises     Shoulder Instructions      Home Living Family/patient expects to be discharged to:: Private residence Living Arrangements: Spouse/significant other Available Help at Discharge: Family;Available 24 hours/day Type of Home: House Home Access: Stairs to enter [employer] of Steps: 8 Entrance Stairs-Rails: Can reach both Home Layout: One level     Bathroom Shower/Tub: Teacher, early years/pre: Standard     Home Equipment: Building services engineer Comments: unsure of the accuracy  Lives With: Spouse    Prior Functioning/Environment Level of Independence: Independent        Comments: ADLs and IADLs        OT Problem List: Decreased strength;Decreased range of motion;Decreased activity tolerance;Impaired balance (sitting and/or standing);Decreased cognition;Decreased safety awareness;Decreased knowledge of use of DME or AE;Pain      OT Treatment/Interventions: Self-care/ADL training;Therapeutic exercise;Energy conservation;DME and/or AE instruction;Therapeutic activities;Patient/family education    OT Goals(Current goals can be found in the care plan section) Acute  Rehab OT Goals Patient Stated Goal: Walk again OT Goal Formulation: With patient Time For Goal Achievement: 06/29/17 Potential to Achieve Goals: Good ADL Goals Pt Will Perform Grooming: with min guard assist;standing Pt  Will Perform Upper Body Dressing: with set-up;with supervision;sitting Pt Will Perform Lower Body Dressing: with min guard assist;sit to/from stand Pt Will Transfer to Toilet: with min guard assist;ambulating;bedside commode Pt Will Perform Toileting - Clothing Manipulation and hygiene: with min guard assist;sit to/from stand  OT Frequency: Min 3X/week   Barriers to D/C:            Co-evaluation PT/OT/SLP Co-Evaluation/Treatment: Yes Reason for Co-Treatment: Complexity of the patient's impairments (multi-system involvement);For patient/therapist safety;To address functional/ADL transfers   OT goals addressed during session: ADL's and self-care      AM-PAC PT "6 Clicks" Daily Activity     Outcome Measure Help from another person eating meals?: None Help from another person taking care of personal grooming?: A Little Help from another person toileting, which includes using toliet, bedpan, or urinal?: A Lot Help from another person bathing (including washing, rinsing, drying)?: A Lot Help from another person to put on and taking off regular upper body clothing?: A Little Help from another person to put on and taking off regular lower body clothing?: A Lot 6 Click Score: 16   End of Session Equipment Utilized During Treatment: Gait belt;Rolling walker Nurse Communication: Mobility status;Other (comment)(Pain at R ankle)  Activity Tolerance: Patient tolerated treatment well Patient left: in chair;with call bell/phone within reach;with chair alarm set  OT Visit Diagnosis: Unsteadiness on feet (R26.81);Other abnormalities of gait and mobility (R26.89);Muscle weakness (generalized) (M62.81);Other symptoms and signs involving cognitive function;Pain Pain - Right/Left:  Right Pain - part of body: Ankle and joints of foot                Time: 9211-9417 OT Time Calculation (min): 38 min Charges:  OT General Charges $OT Visit: 1 Visit OT Evaluation $OT Eval Moderate Complexity: 1 Mod OT Treatments $Self Care/Home Management : 8-22 mins G-Codes:     Sarkis Rhines MSOT, OTR/L Acute Rehab Pager: [phone] Office: [place] 06/15/2017, 4:22 PM

## 2017-06-15 NOTE — Progress Notes (Signed)
[place] Progress Note Patient Name: Anthony [name] DOB: 1951/05/16 MRN: [id number]   Date of Service  06/15/2017  HPI/Events of Note  Hypertension - BP = 147/103.  eICU Interventions  Will order: 1. Hydralazine 10 mg IV Q 4 hours PRN SBP > 160 or DBP > 100.      Intervention Category Major Interventions: Hypertension - evaluation and management  Gitty Osterlund [place] 06/15/2017, 8:18 PM

## 2017-06-15 NOTE — Progress Notes (Signed)
Spoke with patient this morning. Alert, oriented x2. States the year is [birth year], knows [place]. Appreciate the excellent care of CCM, will plan to resume care once stable for transfer to the floor.  Rory Percy, DO PGY-1, [facility] 06/15/2017 7:25 AM

## 2017-06-15 NOTE — Evaluation (Signed)
Physical Therapy Evaluation Patient Details Name: Anthony [name] MRN: [id number] DOB: 09-30-1950 Today's Date: 06/15/2017   History of Present Illness  67 year old male with new onset seizure. Intubated in ER. Past medical history significant for chronic atrial fibrillation on Eliquis, CAD, insulin-dependent diabetes mellitus, hypertension, CHF, OSA on CPAP, GERD, secondary hyperparathyroidism, CKD stage III. MRI brain showed a subcentimeter focus of acute infarction in the white matter adjacent to the right lateral ventricle, likely small vessel disease however small cardioembolic stroke is also possible per MD. Intubated 06/12/17-06/13/17.   Clinical Impression   Pt admitted with above diagnosis. Pt currently with functional limitations due to the deficits listed below (see PT Problem List). Independent prior to admission; Wife has told RN that he has had a slow decline over the past 3-4 weeks, and has been unable to manage his meds well in that time; Presents now with functional dependencies outlined below; He participates well, has help at home, and is motivated to improve; REcommend CIR; will place screen;  Pt will benefit from skilled PT to increase their independence and safety with mobility to allow discharge to the venue listed below.       Follow Up Recommendations CIR    Equipment Recommendations  Rolling walker with 5" wheels;3in1 (PT)    Recommendations for Other Services       Precautions / Restrictions Precautions Precautions: Fall Restrictions Weight Bearing Restrictions: No      Mobility  Bed Mobility Overal bed mobility: Needs Assistance Bed Mobility: Supine to Sit     Supine to sit: Min guard;+2 for safety/equipment     General bed mobility comments: Min Guard for safety. Required significant amount of time and cues for sequencing  Transfers Overall transfer level: Needs assistance Equipment used: 2 person hand held assist;Rolling walker (2  wheeled) Transfers: Sit to/from [facility] Sit to Stand: Min assist;+2 physical assistance;Mod assist Stand pivot transfers: Mod assist;+2 physical assistance       General transfer comment: Min A +2 for EOB for balance. Mod A for BSC to power up into standing and maintain balance with posteior lean. Mod A +2 for SPT and increased cues for sequencing  Ambulation/Gait Ambulation/Gait assistance: Min assist;+2 physical assistance Ambulation Distance (Feet): 5 Feet Assistive device: Rolling walker (2 wheeled) Gait Pattern/deviations: Step-through pattern;Decreased step length - right;Decreased step length - left;Decreased stride length;Trunk flexed     General Gait Details: Heavily dependent on UE support from [employer] Mobility    Modified Rankin (Stroke Patients Only)       Balance Overall balance assessment: Needs assistance Sitting-balance support: No upper extremity supported;Feet supported Sitting balance-Leahy Scale: Fair Sitting balance - Comments: Posteior lean during LB dressing Postural control: Posterior lean Standing balance support: No upper extremity supported;During functional activity Standing balance-Leahy Scale: Poor Standing balance comment: Required UE support or physical A                              Pertinent Vitals/Pain Pain Assessment: Faces Faces Pain Scale: Hurts little more Pain Location: R foot; pt attributes pain to old football injury Pain Descriptors / Indicators: Discomfort;Grimacing(with WBing) Pain Intervention(s): Monitored during session    Home Living Family/patient expects to be discharged to:: Private residence Living Arrangements: Spouse/significant other Available Help at Discharge: Family;Available 24 hours/day Type of Home: House Home Access: Stairs to enter Entrance Stairs-Rails: Can  reach both Entrance Stairs-Number of Steps: 8 Home Layout: One level Home  Equipment: Shower seat Additional Comments: unsure of the accuracy    Prior Function Level of Independence: Independent         Comments: ADLs and IADLs     Hand Dominance   Dominant Hand: Right    Extremity/Trunk Assessment   Upper Extremity Assessment Upper Extremity Assessment: Defer to OT evaluation    Lower Extremity Assessment Lower Extremity Assessment: Generalized weakness;RLE deficits/detail RLE Deficits / Details: R foot/ankle pain with weight bearing; Unable to get MMT assessment due to need to get to bedside commode RLE Coordination: decreased gross motor    Cervical / Trunk Assessment Cervical / Trunk Assessment: Other exceptions Cervical / Trunk Exceptions: Poor trunk control. Noted posterior lean during LB dressing at EOB and in standing.  Communication   Communication: Other (comment)(low volume and slow to answer)  Cognition Arousal/Alertness: Awake/alert Behavior During Therapy: WFL for tasks assessed/performed Overall Cognitive Status: Impaired/Different from baseline Area of Impairment: Following commands;Safety/judgement;Problem solving;Awareness;Attention                   Current Attention Level: Sustained   Following Commands: Follows one step commands with increased time Safety/Judgement: Decreased awareness of safety Awareness: Emergent Problem Solving: Slow processing;Requires verbal cues;Requires tactile cues General Comments: Pt requiring increased time and cues during ADLs and funcitonal mobility. Pt presenting with slower processing as seen during ADLs and when answering questions. Noted pt would mimic conversation (including words or parts of sentences). For example, therapist saying "I think you need some rehab" and pt repornding "I think I need..want to walk."      General Comments General comments (skin integrity, edema, etc.): HR ranged 10-145 (observed highest)    Exercises     Assessment/Plan    PT Assessment Patient  needs continued PT services  PT Problem List Decreased strength;Decreased range of motion;Decreased activity tolerance;Decreased balance;Decreased mobility;Decreased coordination;Decreased cognition;Decreased knowledge of use of DME;Decreased safety awareness;Decreased knowledge of precautions       PT Treatment Interventions DME instruction;Gait training;Stair training;Functional mobility training;Therapeutic activities;Therapeutic exercise;Balance training;Neuromuscular re-education;Cognitive remediation;Patient/family education    PT Goals (Current goals can be found in the Care Plan section)  Acute Rehab PT Goals Patient Stated Goal: Walk again PT Goal Formulation: With patient Time For Goal Achievement: 06/29/17 Potential to Achieve Goals: Good    Frequency Min 4X/week   Barriers to discharge        Co-evaluation PT/OT/SLP Co-Evaluation/Treatment: Yes Reason for Co-Treatment: Complexity of the patient's impairments (multi-system involvement);For patient/therapist safety PT goals addressed during session: Mobility/safety with mobility OT goals addressed during session: ADL's and self-care       AM-PAC PT "6 Clicks" Daily Activity  Outcome Measure Difficulty turning over in bed (including adjusting bedclothes, sheets and blankets)?: A Lot Difficulty moving from lying on back to sitting on the side of the bed? : A Lot Difficulty sitting down on and standing up from a chair with arms (e.g., wheelchair, bedside commode, etc,.)?: Unable Help needed moving to and from a bed to chair (including a wheelchair)?: A Lot Help needed walking in hospital room?: A Little Help needed climbing 3-5 steps with a railing? : A Lot 6 Click Score: 12    End of Session Equipment Utilized During Treatment: Gait belt Activity Tolerance: Patient tolerated treatment well Patient left: in chair;with call bell/phone within reach;with chair alarm set Nurse Communication: Mobility status PT Visit  Diagnosis: Unsteadiness on feet (R26.81);Other abnormalities of gait  and mobility (R26.89);Muscle weakness (generalized) (M62.81)    Time: 7737-3668 PT Time Calculation (min) (ACUTE ONLY): 39 min   Charges:   PT Evaluation $PT Eval Moderate Complexity: 1 Mod     PT G Codes:        Roney Marion, PT  Acute Rehabilitation Services Pager [phone] Office [phone]   [place] 06/15/2017, 5:38 PM

## 2017-06-15 NOTE — Evaluation (Signed)
Speech Language Pathology Evaluation Patient Details Name: Anthony [name] MRN: [id number] DOB: [birth year] Today's Date: 06/15/2017 Time: 1151-1208 SLP Time Calculation (min) (ACUTE ONLY): 17 min  Problem List:  Patient Active Problem List   Diagnosis Date Noted  . Right temporal lobe infarction 06/13/2017  . Paranasal sinus disease 06/13/2017  . Atrial fibrillation with rapid ventricular response ([place]) 06/13/2017  . Acute renal failure superimposed on stage 3 chronic kidney disease ([place]) 06/13/2017  . Seizures ([place]) 06/12/2017  . Sebaceous cyst of breast, left 04/25/2017  . CKD stage 3 due to type 2 diabetes mellitus ([place]) 01/22/2017  . Secondary hyperparathyroidism of renal origin ([place]) 01/22/2017  . Chronic anticoagulation 03/06/2016  . Lower extremity edema 06/28/2015  . Sebaceous cyst 04/27/2015  . Health care maintenance 02/19/2015  . Chronic, continuous use of opioids 10/18/2014  . Acute combined systolic and diastolic CHF, NYHA class 2 ([place]) 08/31/2014  . OSA on CPAP 04/04/2014  . Chronic atrial fibrillation ([place]) 11/09/2013  . Tobacco abuse 06/28/2013  . Left foot pain 01/13/2013  . Low testosterone 11/06/2011  . Special screening for malignant neoplasms, colon 11/06/2011  . Chronic pain syndrome 08/01/2011  . Athlete's foot 02/28/2011  . ONYCHOMYCOSIS, TOENAILS 11/07/2008  . DIABETIC  RETINOPATHY 02/24/2008  . VENOUS INSUFFICIENCY, LEGS 12/18/2007  . NEUROPATHY, DIABETIC 07/24/2006  . IDDM (insulin dependent diabetes mellitus) ([place]) 07/24/2006  . HYPERCHOLESTEROLEMIA 07/24/2006  . OBESITY, NOS 07/24/2006  . ERECTILE DYSFUNCTION 07/24/2006  . DEPRESSIVE DISORDER, NOS 07/24/2006  . HYPERTENSION, BENIGN SYSTEMIC 07/24/2006  . Coronary atherosclerosis 07/24/2006  . GASTROESOPHAGEAL REFLUX, NO ESOPHAGITIS 07/24/2006   Past Medical History:  Past Medical History:  Diagnosis Date  . Arthritis   . CAD in native artery    a. reported MI 2000, 2001, s/p stenting of the  circumflex lesion extending into the second obtuse marginal branch with a drug-eluting stent in 2003. b. low risk nuc 2015.  Marland Kitchen Chronic atrial fibrillation ([place])   . Chronic combined systolic and diastolic CHF (congestive heart failure) ([place])    a. Previously diastolic, then EF [id number] in 10/2016.  . CKD (chronic kidney disease), stage III ([place])   . Depression   . Diabetes mellitus   . Edema of extremities   . Erectile dysfunction   . GERD (gastroesophageal reflux disease)   . Hyperlipidemia   . Hypertension   . Myocardial infarction ([place]) 2000&2001  . Obesity   . Onychomycosis   . OSA (obstructive sleep apnea)   . Tubular adenoma of colon 10/2002   Past Surgical History:  Past Surgical History:  Procedure Laterality Date  . ANGIOPLASTY  2001   stent x 1  . COLONOSCOPY  2000?   negative  . TOE AMPUTATION Left 201110   HPI:  67 year old male with new onset seizure. Intubated in ER. Past medical history significant for chronic atrial fibrillation on Eliquis, CAD, insulin-dependent diabetes mellitus, hypertension, CHF, OSA on CPAP, GERD, secondary hyperparathyroidism, CKD stage III.MRI brain showed a subcentimeter focus of acute infarction in the white matter adjacent to the right lateral ventricle, likely small vessel disease however small cardioembolic stroke is also possible per MD. Intubated 06/12/17-06/13/17.   Assessment / Plan / Recommendation Clinical Impression  Pt presents with mild cognitive impairment and mild dysarthria. Per pt's wife, she has noted confusion and speech changes over the past 3-4 weeks, questions whether secondary to pt's blood sugar as he went off of his medication. She reports worsening dysarthria s/p seizures. Pt scored 21/30 on Mini Mental State  Examination, indicating mild cognitive impairment. Slow processing, impaired problem solving apparent. Anticipate symptoms will continue to resolve; SLP will follow briefly for diagnostic treatment to determine need for  further f/u should cognitive deficits and dysarthria persist.     SLP Assessment  SLP Recommendation/Assessment: Patient needs continued Speech Lanaguage Pathology Services SLP Visit Diagnosis: Cognitive communication deficit (R41.841);Dysarthria and anarthria (R47.1)    Follow Up Recommendations  (tbd)    Frequency and Duration min 1 x/week  1 week      SLP Evaluation Cognition  Overall Cognitive Status: Impaired/Different from baseline Arousal/Alertness: Awake/alert Orientation Level: Oriented to person;Oriented to place;Oriented to time Attention: Sustained Sustained Attention: Appears intact Memory: Impaired Memory Impairment: Decreased recall of new information Awareness: Appears intact Problem Solving: Impaired Problem Solving Impairment: Functional complex(0/5 serial subtraction) Safety/Judgment: Appears intact Comments: slow processing noted       Comprehension  Auditory Comprehension Overall Auditory Comprehension: Appears within functional limits for tasks assessed(slow processing) Interfering Components: Processing speed Visual Recognition/Discrimination Discrimination: Within Function Limits Reading Comprehension Reading Status: Within funtional limits(sentence level)    Expression Expression Primary Mode of Expression: Verbal Verbal Expression Overall Verbal Expression: Appears within functional limits for tasks assessed Initiation: No impairment Automatic Speech: Name;Social Response Level of Generative/Spontaneous Verbalization: Conversation Repetition: Impaired Level of Impairment: Sentence level Naming: No impairment Pragmatics: No impairment Non-Verbal Means of Communication: Not applicable Written Expression Dominant Hand: Right Written Expression: Exceptions to [facility] Self Formulation Ability: (sentence level impaired)   Oral / Motor  Oral Motor/Sensory Function Overall Oral Motor/Sensory Function: Within functional limits Motor  Speech Overall Motor Speech: Impaired Respiration: Impaired Level of Impairment: Sentence Phonation: Low vocal intensity Resonance: Within functional limits Articulation: Impaired Level of Impairment: Sentence Intelligibility: Intelligibility reduced Word: 75-100% accurate Phrase: 75-100% accurate Sentence: 75-100% accurate Conversation: 75-100% accurate(90%) Motor Planning: Witnin functional limits Effective Techniques: Increased vocal intensity   GO                   Deneise Lever, [place], CCC-SLP Speech-Language Pathologist [phone]  Aliene Altes 06/15/2017, 1:25 PM

## 2017-06-15 NOTE — Progress Notes (Signed)
Inpatient Rehabilitation  Per PT/OT recommendations, patient was screened by Gunnar Fusi for appropriateness for an Inpatient Acute Rehab consult.  At this time we are recommending an Inpatient Rehab consult.  Please order if you are agreeable.  Carmelia Roller., CCC/SLP Admission Coordinator  [place]  Cell [phone]

## 2017-06-16 LAB — BASIC METABOLIC PANEL
Anion gap: 15 (ref 5–15)
BUN: 15 mg/dL (ref 6–20)
CO2: 22 mmol/L (ref 22–32)
Calcium: 9 mg/dL (ref 8.9–10.3)
Chloride: 102 mmol/L (ref 101–111)
Creatinine, Ser: 2.1 mg/dL — ABNORMAL HIGH (ref 0.61–1.24)
GFR calc Af Amer: 36 mL/min — ABNORMAL LOW (ref >60)
GFR calc non Af Amer: 31 mL/min — ABNORMAL LOW (ref >60)
Glucose, Bld: 199 mg/dL — ABNORMAL HIGH (ref 65–99)
Potassium: 3.3 mmol/L — ABNORMAL LOW (ref 3.5–5.1)
Sodium: 139 mmol/L (ref 135–145)

## 2017-06-16 LAB — CBC
HCT: 40.8 % (ref 39.0–52.0)
Hemoglobin: 14.4 g/dL (ref 13.0–17.0)
MCH: 30.4 pg (ref 26.0–34.0)
MCHC: 35.3 g/dL (ref 30.0–36.0)
MCV: 86.1 fL (ref 78.0–100.0)
Platelets: 110 10*3/uL — ABNORMAL LOW (ref 150–400)
RBC: 4.74 MIL/uL (ref 4.22–5.81)
RDW: 14.4 % (ref 11.5–15.5)
WBC: 8.4 10*3/uL (ref 4.0–10.5)

## 2017-06-16 LAB — GLUCOSE, CAPILLARY
Glucose-Capillary: 222 mg/dL — ABNORMAL HIGH (ref 65–99)
Glucose-Capillary: 375 mg/dL — ABNORMAL HIGH (ref 65–99)
Glucose-Capillary: 304 mg/dL — ABNORMAL HIGH (ref 65–99)
Glucose-Capillary: 322 mg/dL — ABNORMAL HIGH (ref 65–99)

## 2017-06-16 LAB — MAGNESIUM: Magnesium: 1.4 mg/dL — ABNORMAL LOW (ref 1.7–2.4)

## 2017-06-16 MED ORDER — INSULIN GLARGINE 100 UNIT/ML ~~LOC~~ SOLN
8.0000 [IU] | Freq: Every day | SUBCUTANEOUS | Status: DC
  Administered 2017-06-16: 8 [IU] via SUBCUTANEOUS
  Filled 2017-06-16 (×2): qty 0.08

## 2017-06-16 NOTE — Care Management Note (Signed)
Case Management Note  Patient Details  Name: Anthony [name] MRN: [id number] Date of Birth: Jun 24, 1950  Subjective/Objective:   Pt admitted on 06/12/17 with new onset seizures.  PTA, pt resided at home with spouse.  Per wife, he has had a slow decline over the past 3-4 weeks.                    Action/Plan: PT/OT recommending CIR, and consult pending.  MD, please order rehab consult if you agree.    Expected Discharge Date:                  Expected Discharge Plan:  [place]  In-House Referral:     Discharge planning Services  CM Consult  Post Acute Care Choice:    Choice offered to:     DME Arranged:    DME Agency:     HH Arranged:    [place] Agency:     Status of Service:  In process, will continue to follow  If discussed at Long Length of Stay Meetings, dates discussed:    Additional Comments:  Ella Bodo, RN 06/16/2017, 4:07 PM

## 2017-06-16 NOTE — Progress Notes (Signed)
Physical Therapy Treatment Patient Details Name: Anthony [name] MRN: [id number] DOB: November 30, 1950 Today's Date: 06/16/2017    History of Present Illness 67 year old male with new onset seizure. Intubated in ER. Past medical history significant for chronic atrial fibrillation on Eliquis, CAD, insulin-dependent diabetes mellitus, hypertension, CHF, OSA on CPAP, GERD, secondary hyperparathyroidism, CKD stage III. MRI brain showed a subcentimeter focus of acute infarction in the white matter adjacent to the right lateral ventricle, likely small vessel disease however small cardioembolic stroke is also possible per MD. Intubated 06/12/17-06/13/17.     PT Comments    Continuing work on functional mobility and activity tolerance;  Noting significant improvement in speed of response to questions (also voice volume), and smoothness and better speed of movement as well; Able to initiate hallway ambulation with chair behind for safety; Continue to recommend comprehensive inpatient rehab (CIR) for post-acute therapy needs.    Follow Up Recommendations  CIR     Equipment Recommendations  Rolling walker with 5" wheels;3in1 (PT)    Recommendations for Other Services       Precautions / Restrictions Precautions Precautions: Fall    Mobility  Bed Mobility                  Transfers Overall transfer level: Needs assistance Equipment used: Rolling walker (2 wheeled) Transfers: Sit to/from Stand Sit to Stand: Min assist         General transfer comment: Cues to initiate with anterior weight shift, and for hand placement and safety; much improved rise  Ambulation/Gait Ambulation/Gait assistance: Min assist;+2 safety/equipment Ambulation Distance (Feet): 60 Feet(Hallway amb) Assistive device: Rolling walker (2 wheeled) Gait Pattern/deviations: Step-through pattern;Decreased stride length;Trunk flexed     General Gait Details: Heavily dependent on UE support from RW; Much improved step  length and posture   Stairs            Wheelchair Mobility    Modified Rankin (Stroke Patients Only)       Balance     Sitting balance-Leahy Scale: Fair       Standing balance-Leahy Scale: Poor                              Cognition Arousal/Alertness: Awake/alert Behavior During Therapy: WFL for tasks assessed/performed                                   General Comments: Much improved       Exercises      General Comments General comments (skin integrity, edema, etc.): HR 125 observed max      Pertinent Vitals/Pain Pain Assessment: No/denies pain Pain Location: Reports r foot much better than yesterday    Home Living                      Prior Function            PT Goals (current goals can now be found in the care plan section) Acute Rehab PT Goals Patient Stated Goal: Walk again PT Goal Formulation: With patient Time For Goal Achievement: 06/29/17 Potential to Achieve Goals: Good Progress towards PT goals: Progressing toward goals    Frequency    Min 4X/week      PT Plan Current plan remains appropriate    Co-evaluation  AM-PAC PT "6 Clicks" Daily Activity  Outcome Measure  Difficulty turning over in bed (including adjusting bedclothes, sheets and blankets)?: A Lot Difficulty moving from lying on back to sitting on the side of the bed? : A Lot Difficulty sitting down on and standing up from a chair with arms (e.g., wheelchair, bedside commode, etc,.)?: A Lot Help needed moving to and from a bed to chair (including a wheelchair)?: A Lot Help needed walking in hospital room?: A Little Help needed climbing 3-5 steps with a railing? : A Lot 6 Click Score: 13    End of Session Equipment Utilized During Treatment: Gait belt Activity Tolerance: Patient tolerated treatment well Patient left: in chair;with call bell/phone within reach Nurse Communication: Mobility status PT Visit  Diagnosis: Unsteadiness on feet (R26.81);Other abnormalities of gait and mobility (R26.89);Muscle weakness (generalized) (M62.81)     Time: 1451-1510 PT Time Calculation (min) (ACUTE ONLY): 19 min  Charges:  $Gait Training: 8-22 mins                    G Codes:       Roney Marion, PT  Acute Rehabilitation Services Pager [phone] Office [place] 06/16/2017, 5:13 PM

## 2017-06-16 NOTE — Progress Notes (Signed)
FPTS Interim Progress Note  S: Saw and examined Mr. Borjon this morning.  He had no complaints and denied heart palpitations, pain, or confusion.    O: BP (!) 142/101 (BP Location: Left Arm)   Pulse 84   Temp 97.8 F (36.6 C) (Oral)   Resp 16   Ht 6\' 1"  (1.854 m)   Wt 218 lb 14.7 oz (99.3 kg)   SpO2 97%   BMI 28.88 kg/m   Physical Exam  Constitutional: He appears well-developed and well-nourished. No distress.  HENT:  Head: Normocephalic and atraumatic.  Eyes: EOM are normal.  Neck: Normal range of motion.  Cardiovascular: Normal rate.  Irregular rhythm  Pulmonary/Chest: Effort normal and breath sounds normal.  Abdominal: Soft. Bowel sounds are normal.  Musculoskeletal: Normal range of motion.  Neurological: He is alert.  Skin: Skin is warm and dry.  Psychiatric: He has a normal mood and affect. His behavior is normal.   A/P: Patient seems to be recuperating well from his seizures and his atrial fibrillation is currently stable.  We appreciate the excellent care provided by the Neuro ICU team and will be happy to resume care once he is transferred from ICU.  Kathrene Alu, MD 06/16/2017, 9:50 AM PGY-1, [place] Medicine Service pager [phone]

## 2017-06-16 NOTE — Progress Notes (Signed)
NURSING PROGRESS NOTE  Anthony [name] [id number] Transfer Data: 06/16/2017 4:53 PM Attending Provider: Kandice Hams, MD SPQ:[id number], [name], MD Code Status: full   Anthony [name] is a 67 y.o. male patient transferred from [place]   -No acute distress noted.  -No complaints of shortness of breath.  -No complaints of chest pain.   Cardiac Monitoring: Box # Biomed 6 in place.  Blood pressure (!) 136/93, pulse 82, temperature 97.7 F (36.5 C), temperature source Oral, resp. rate 18, height 6\' 1"  (1.854 m), weight 99.3 kg (218 lb 14.7 oz), SpO2 95 %.   IV Fluids:  IV in place, occlusive dsg intact without redness. Allergies:  Enalapril maleate  Past Medical History:   has a past medical history of Arthritis, CAD in native artery, Chronic atrial fibrillation ([place]), Chronic combined systolic and diastolic CHF (congestive heart failure) ([place]), CKD (chronic kidney disease), stage III ([place]), Depression, Diabetes mellitus, Edema of extremities, Erectile dysfunction, GERD (gastroesophageal reflux disease), Hyperlipidemia, Hypertension, Myocardial infarction ([place]) (2000&2001), Obesity, Onychomycosis, OSA (obstructive sleep apnea), and Tubular adenoma of colon (10/2002).  Past Surgical History:   has a past surgical history that includes Angioplasty (2001); Toe amputation (Left, 2012); and Colonoscopy (2000?).  Social History:   reports that he quit smoking about a year ago. His smoking use included cigarettes. He started smoking about 50 years ago. He has a 11.25 pack-year smoking history. he has never used smokeless tobacco. He reports that he does not drink alcohol or use drugs.  Patient/Family orientated to room. Information packet given to patient/family. Admission inpatient armband information verified with patient/family to include name and date of birth and placed on patient arm. Side rails up x 2, fall assessment and education completed with patient/family. Patient/family able to  verbalize understanding of risk associated with falls and verbalized understanding to call for assistance before getting out of bed. Call light within reach. Patient/family able to voice and demonstrate understanding of unit orientation instructions.    Will continue to evaluate and treat per MD orders.

## 2017-06-16 NOTE — Progress Notes (Signed)
PULMONARY / CRITICAL CARE MEDICINE   Name: Anthony [name] MRN: [id number] DOB: 10-12-1950    ADMISSION DATE:  06/12/2017  CHIEF COMPLAINT:  seizure  HISTORY OF PRESENT ILLNESS:        This is a 67 year old diabetic who presented to the emergency room following a seizure.  He has a history of chronic atrial fibrillation for which she is normally on Eliquis.  He received benzodiazepines in route and required intubation mechanical ventilation.  He has subsequently been extubated.  MRI has shown a very small area of acute infarction adjacent to the right lateral ventricle and multiple small old infarctions.  It was felt that his seizures were related to extreme hyperglycemia and electrolyte abnormalities on presentation, and his anticonvulsants have been discontinued.  His ventricular rate is currently being controlled with oral Toprol-XL, he has been weaned off of the Cardizem infusion. He is sitting up feeding himself this morning.  He has no acute complaints.  He does not recall the days leading up to his presentation and cannot tell me whether he had some acute illness which may have precipitated the rise in his blood sugar.  PAST MEDICAL HISTORY :  He  has a past medical history of Arthritis, CAD in native artery, Chronic atrial fibrillation ([place]), Chronic combined systolic and diastolic CHF (congestive heart failure) ([place]), CKD (chronic kidney disease), stage III ([place]), Depression, Diabetes mellitus, Edema of extremities, Erectile dysfunction, GERD (gastroesophageal reflux disease), Hyperlipidemia, Hypertension, Myocardial infarction ([place]) (2000&2001), Obesity, Onychomycosis, OSA (obstructive sleep apnea), and Tubular adenoma of colon (10/2002).  PAST SURGICAL HISTORY: He  has a past surgical history that includes Angioplasty (2001); Toe amputation (Left, 2012); and Colonoscopy (2000?).  Allergies  Allergen Reactions  . Enalapril Maleate Cough    No current facility-administered medications  on file prior to encounter.    Current Outpatient Medications on File Prior to Encounter  Medication Sig  . apixaban (ELIQUIS) 5 MG TABS tablet Take 1 tablet by mouth twice a day (Patient taking differently: Take 5 mg by mouth 2 (two) times daily. )  . calcitRIOL (ROCALTROL) 0.25 MCG capsule TAKE 0.25 MG BY MOUTH EVERY MON WED AND FRIDAY  . furosemide (LASIX) 40 MG tablet Take 1 tablet (40 mg total) by mouth 2 (two) times daily.  Marland Kitchen gabapentin (NEURONTIN) 300 MG capsule TAKE ONE CAPSULE BY MOUTH AT BEDTIME (Patient taking differently: TAKE ONE CAPSULE (300mg )BY MOUTH AT BEDTIME)  . insulin NPH-regular Human (NOVOLIN 70/30) (70-30) 100 UNIT/ML injection Inject 55 Units into the skin daily at 12 noon.  Marland Kitchen losartan (COZAAR) 100 MG tablet Take 1 tablet (100 mg total) by mouth daily.  . metoprolol succinate (TOPROL-XL) 50 MG 24 hr tablet Take 1 tablet (50 mg total) 2 (two) times daily by mouth. Take with or immediately following a meal.  . Multiple Vitamin (MULTIVITAMIN) tablet Take 1 tablet by mouth daily.    . nitroGLYCERIN (NITROSTAT) 0.4 MG SL tablet Place 0.4 mg under the tongue every 5 (five) minutes as needed for chest pain. If no relief by 3rd tab, call 911   . Omega 3 1000 MG CAPS Take 1,000 mg by mouth 3 (three) times daily.   Marland Kitchen omeprazole (PRILOSEC) 40 MG capsule Take 1 capsule (40 mg total) by mouth daily.  Marland Kitchen oxymorphone (OPANA) 5 MG tablet Take 5 mg by mouth every 6 (six) hours as needed for pain (max 3 tablets daily). Per Dr Brien Few  . PARoxetine (PAXIL) 40 MG tablet Take 1 tablet (40 mg total)  by mouth every morning.  . potassium chloride SA (K-DUR,KLOR-CON) 20 MEQ tablet Take 1 tablet (20 mEq total) by mouth 2 (two) times daily.  . rosuvastatin (CRESTOR) 5 MG tablet Take 1 tablet (5 mg total) by mouth daily.    FAMILY HISTORY:  His indicated that his mother is deceased. He indicated that his father is deceased. He indicated that the status of his neg hx is unknown.   SOCIAL  HISTORY: He  reports that he quit smoking about a year ago. His smoking use included cigarettes. He started smoking about 50 years ago. He has a 11.25 pack-year smoking history. he has never used smokeless tobacco. He reports that he does not drink alcohol or use drugs.   SUBJECTIVE:  As above.  VITAL SIGNS: BP (!) 142/101 (BP Location: Left Arm)   Pulse 84   Temp 97.8 F (36.6 C) (Oral)   Resp 16   Ht 6\' 1"  (1.854 m)   Wt 218 lb 14.7 oz (99.3 kg)   SpO2 97%   BMI 28.88 kg/m   HEMODYNAMICS:    VENTILATOR SETTINGS:    INTAKE / OUTPUT: I/O last 3 completed shifts: In: 71 [P.O.:360; I.V.:216] Out: 5050 [Urine:5050]  PHYSICAL EXAMINATION: General: This is a 67 year old male sitting up in bed feeding himself and in no distress. Neuro: He is not oriented to the day or date.  Pupils are equal EOMs are full and he is using all fours. Cardiovascular: S1 and S2 are irregularly irregular without murmur rub or gallop. Lungs: Respirations are unlabored, there is symmetric air movement, no wheezes. Abdomen: The abdomen is soft without any organomegaly masses tenderness guarding or rebound  LABS:  BMET Recent Labs  Lab 06/15/17 0437 06/15/17 0725 06/16/17 0341  NA 142 141 139  K 3.1* 3.4* 3.3*  CL 108 107 102  CO2 22 22 22   BUN 15 15 15   CREATININE 2.23* 2.17* 2.10*  GLUCOSE 99 133* 199*    Electrolytes Recent Labs  Lab 06/12/17 2323 06/13/17 0233  06/15/17 0437 06/15/17 0725 06/16/17 0341  CALCIUM 8.2* 8.0*   < > 8.9 8.9 9.0  MG 1.8 1.9  --   --   --  1.4*   < > = values in this interval not displayed.    CBC Recent Labs  Lab 06/12/17 2115  06/12/17 2342 06/13/17 0233 06/16/17 0341  WBC 8.5  --   --  6.6 8.4  HGB 14.9   < > 13.3 13.2 14.4  HCT 44.4   < > 39.0 37.9* 40.8  PLT 144*  --   --  121* 110*   < > = values in this interval not displayed.    Coag's Recent Labs  Lab 06/12/17 2115  INR 1.33    Sepsis Markers Recent Labs  Lab  06/12/17 2323 06/13/17 0233 06/14/17 0309  LATICACIDVEN 3.1* 2.3*  --   PROCALCITON 0.21 0.22 0.16    ABG Recent Labs  Lab 06/12/17 2156 06/13/17 0643  PHART 7.284* 7.348*  PCO2ART 35.4 33.9  PO2ART 331.0* 144.0*    Liver Enzymes Recent Labs  Lab 06/12/17 2115  AST 48*  ALT 34  ALKPHOS 120  BILITOT 1.1  ALBUMIN 3.0*    Cardiac Enzymes No results for input(s): TROPONINI, PROBNP in the last 168 hours.  Glucose Recent Labs  Lab 06/15/17 0326 06/15/17 0717 06/15/17 1229 06/15/17 1710 06/15/17 2116 06/16/17 0745  GLUCAP 109* 124* 249* 347* 173* 222*    Imaging No results found.  DISCUSSION:      This is a 67 year old diabetic with a history of chronic atrial fibrillation who normally takes Eliquis, who presented with a seizure and was found to have a new small acute right sided infarct.  He was debated after being given benzodiazepines to control his seizure and is subsequently been extubated.  He is sitting up in a chair feeding himself.  His ventricular rate is controlled on oral medications.  He is safe to transfer out of the intensive care unit today, critical care will not routinely follow please reconsult [place] if needed.     ASSESSMENT / PLAN:  PULMONARY A: He was intubated for airway protection, extubation has been well-tolerated.  CARDIOVASCULAR A: He is in chronic atrial fibrillation, his rate is currently adequately controlled on oral Toprol-XL.  He continues Eliquis.  RENAL A: Renal function is marginally improved from presentation.  ENDOCRINE A: Diabetic control is marginal, I have added a small at bedtime dose of Lantus.   NEUROLOGIC A: He does not have any gross motor residual from his CVA, and is not had any further seizure activity.  He is off his anticonvulsants.    Lars Masson, MD Pulmonary and [place] Pager: [phone]  06/16/2017, 9:18 AM

## 2017-06-17 ENCOUNTER — Encounter (HOSPITAL_COMMUNITY): Payer: Self-pay | Admitting: Physical Medicine and Rehabilitation

## 2017-06-17 DIAGNOSIS — E119 Type 2 diabetes mellitus without complications: Secondary | ICD-10-CM

## 2017-06-17 DIAGNOSIS — R413 Other amnesia: Secondary | ICD-10-CM

## 2017-06-17 DIAGNOSIS — R4189 Other symptoms and signs involving cognitive functions and awareness: Secondary | ICD-10-CM

## 2017-06-17 DIAGNOSIS — I6389 Other cerebral infarction: Secondary | ICD-10-CM

## 2017-06-17 DIAGNOSIS — G4733 Obstructive sleep apnea (adult) (pediatric): Secondary | ICD-10-CM

## 2017-06-17 DIAGNOSIS — E876 Hypokalemia: Secondary | ICD-10-CM

## 2017-06-17 DIAGNOSIS — R4182 Altered mental status, unspecified: Secondary | ICD-10-CM

## 2017-06-17 DIAGNOSIS — I10 Essential (primary) hypertension: Secondary | ICD-10-CM

## 2017-06-17 DIAGNOSIS — N183 Chronic kidney disease, stage 3 unspecified: Secondary | ICD-10-CM

## 2017-06-17 DIAGNOSIS — Z794 Long term (current) use of insulin: Secondary | ICD-10-CM

## 2017-06-17 DIAGNOSIS — D696 Thrombocytopenia, unspecified: Secondary | ICD-10-CM

## 2017-06-17 DIAGNOSIS — N179 Acute kidney failure, unspecified: Secondary | ICD-10-CM

## 2017-06-17 DIAGNOSIS — I4891 Unspecified atrial fibrillation: Secondary | ICD-10-CM

## 2017-06-17 DIAGNOSIS — M25571 Pain in right ankle and joints of right foot: Secondary | ICD-10-CM

## 2017-06-17 DIAGNOSIS — R739 Hyperglycemia, unspecified: Secondary | ICD-10-CM

## 2017-06-17 LAB — MAGNESIUM
Magnesium: 1.4 mg/dL — ABNORMAL LOW (ref 1.7–2.4)
Magnesium: 2 mg/dL (ref 1.7–2.4)

## 2017-06-17 LAB — BASIC METABOLIC PANEL
Anion gap: 14 (ref 5–15)
BUN: 16 mg/dL (ref 6–20)
CO2: 20 mmol/L — ABNORMAL LOW (ref 22–32)
Calcium: 8.5 mg/dL — ABNORMAL LOW (ref 8.9–10.3)
Chloride: 101 mmol/L (ref 101–111)
Creatinine, Ser: 1.98 mg/dL — ABNORMAL HIGH (ref 0.61–1.24)
GFR calc Af Amer: 39 mL/min — ABNORMAL LOW (ref >60)
GFR calc non Af Amer: 34 mL/min — ABNORMAL LOW (ref >60)
Glucose, Bld: 248 mg/dL — ABNORMAL HIGH (ref 65–99)
Potassium: 3.3 mmol/L — ABNORMAL LOW (ref 3.5–5.1)
Sodium: 135 mmol/L (ref 135–145)

## 2017-06-17 LAB — GLUCOSE, CAPILLARY
Glucose-Capillary: 216 mg/dL — ABNORMAL HIGH (ref 65–99)
Glucose-Capillary: 276 mg/dL — ABNORMAL HIGH (ref 65–99)
Glucose-Capillary: 339 mg/dL — ABNORMAL HIGH (ref 65–99)

## 2017-06-17 MED ORDER — ROSUVASTATIN CALCIUM 20 MG PO TABS
20.0000 mg | ORAL_TABLET | Freq: Every day | ORAL | Status: DC
  Administered 2017-06-17 – 2017-06-20 (×4): 20 mg via ORAL
  Filled 2017-06-17 (×4): qty 1

## 2017-06-17 MED ORDER — METOPROLOL TARTRATE 50 MG PO TABS
50.0000 mg | ORAL_TABLET | Freq: Two times a day (BID) | ORAL | Status: DC
  Administered 2017-06-17 – 2017-06-19 (×4): 50 mg via ORAL
  Filled 2017-06-17 (×4): qty 1

## 2017-06-17 MED ORDER — PANTOPRAZOLE SODIUM 40 MG PO TBEC
40.0000 mg | DELAYED_RELEASE_TABLET | Freq: Every day | ORAL | Status: DC
  Administered 2017-06-17 – 2017-06-19 (×3): 40 mg via ORAL
  Filled 2017-06-17 (×3): qty 1

## 2017-06-17 MED ORDER — ROSUVASTATIN CALCIUM 20 MG PO TABS: 40.0000 mg | ORAL_TABLET | Freq: Every day | ORAL | Status: DC

## 2017-06-17 MED ORDER — STROKE: EARLY STAGES OF RECOVERY BOOK
Freq: Once | Status: DC
  Filled 2017-06-17: qty 1

## 2017-06-17 MED ORDER — METOPROLOL SUCCINATE ER 100 MG PO TB24: 100.0000 mg | ORAL_TABLET | Freq: Every day | ORAL | Status: DC

## 2017-06-17 MED ORDER — INSULIN GLARGINE 100 UNIT/ML ~~LOC~~ SOLN
16.0000 [IU] | Freq: Every day | SUBCUTANEOUS | Status: DC
  Administered 2017-06-17: 16 [IU] via SUBCUTANEOUS
  Filled 2017-06-17: qty 0.16

## 2017-06-17 MED ORDER — MAGNESIUM SULFATE 2 GM/50ML IV SOLN
2.0000 g | Freq: Once | INTRAVENOUS | Status: AC
  Administered 2017-06-17: 2 g via INTRAVENOUS
  Filled 2017-06-17: qty 50

## 2017-06-17 MED ORDER — LIVING WELL WITH DIABETES BOOK
Freq: Once | Status: AC
  Administered 2017-06-17: 12:00:00
  Filled 2017-06-17: qty 1

## 2017-06-17 MED ORDER — POLYETHYLENE GLYCOL 3350 17 G PO PACK
17.0000 g | PACK | Freq: Every day | ORAL | Status: DC
  Administered 2017-06-17 – 2017-06-20 (×4): 17 g via ORAL
  Filled 2017-06-17 (×4): qty 1

## 2017-06-17 NOTE — Consult Note (Signed)
Physical Medicine and Rehabilitation Consult   Reason for Consult: Functional decline due to CVA and seizures.  Referring Physician: Dr. Gilford Raid.    HPI: Anthony [name] is a 67 y.o. male with history of T2DM, CAD, CAF- on Eliquis, HTN, CKD, OSA, alcohol abuse in the past;  who was admitted on 06/12/17 with generalized seizure activity and decreased LOC requiring intubation in ED for airway protection.  History taken from chart review and patient.  Patient with another seizure enroute with  BS in 600s and GCS 3 at admission. He was loaded with Keppra and CT head reviewed, negative for acute process.  MRI brain done revealing punctate subacute/early infarct in right temporal periventricular white matter, chronic hemosiderin stained infarcts left anterolateral frontal lobe and left thalamus as well as evidence of moderate to severe chronic white matter disease.  EEG showed diffuse slowing due to nonspecific cerebral dysfunction and no seizure activity.   He tolerated extubation on 01/18 and neurology felt that chronic hemosiderin stained infarct may have served as seizure focus and provoked by severe hyperglycemia. Dr. Horatio Pel recommended d/c of  Keppra and resume if seizures recur with normal BS.  ST evaluation showed mild cognitive impairment with dysarthria with delayed processing.  Family reported decline in cognitive status over past few weeks with recent medication changes. Therapy ongoing and patient showing improvement in mentation but continues to have limitation in functional tasks and mobility. CIR recommended for follow up therapy.    Review of Systems  Constitutional: Negative for chills, diaphoresis and fever.  HENT: Negative for hearing loss and tinnitus.   Eyes: Negative for blurred vision and double vision.  Respiratory: Negative for cough and shortness of breath.   Cardiovascular: Negative for chest pain and palpitations.  Gastrointestinal: Negative for heartburn and  nausea.  Genitourinary: Negative for dysuria.  Musculoskeletal: Positive for back pain (chronic due to old injury), joint pain (OA bilateral feet with pain. Left> right) and myalgias.  Skin: Negative for rash.  Neurological: Positive for focal weakness (feels RLE is weaker). Negative for dizziness and headaches.  All other systems reviewed and are negative.     Past Medical History:  Diagnosis Date  . Arthritis   . CAD in native artery    a. reported MI 2000, 2001, s/p stenting of the circumflex lesion extending into the second obtuse marginal branch with a drug-eluting stent in 2003. b. low risk nuc 2015.  Marland Kitchen Chronic atrial fibrillation ([place])   . Chronic combined systolic and diastolic CHF (congestive heart failure) ([place])    a. Previously diastolic, then EF 86-76% in 10/2016.  . CKD (chronic kidney disease), stage III ([place])   . Depression   . Diabetes mellitus   . Edema of extremities   . Erectile dysfunction   . GERD (gastroesophageal reflux disease)   . Hyperlipidemia   . Hypertension   . Myocardial infarction ([place]) 2000&2001  . Obesity   . Onychomycosis   . OSA (obstructive sleep apnea)   . Tubular adenoma of colon 10/2002    Past Surgical History:  Procedure Laterality Date  . ANGIOPLASTY  2001   stent x 1  . COLONOSCOPY  2000?   negative  . TOE AMPUTATION Left 2012    Family History  Problem Relation Age of Onset  . Heart attack Mother   . Heart attack Father   . Colon cancer Neg Hx   . Stomach cancer Neg Hx     Social History:  Married.  Independent PTA. 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 He reports that he quit smoking about a couple of months ago.  is smoking use included cigarettes. He started smoking about 50 years ago. He has a 11.25 pack-year smoking history. He has never used smokeless tobacco. He reports that he does not drink alcohol (quit in the 90's) or use drugs.    Allergies  Allergen Reactions  . Enalapril  Maleate Cough   Medications Prior to Admission  Medication Sig Dispense Refill  . apixaban (ELIQUIS) 5 MG TABS tablet Take 1 tablet by mouth twice a day (Patient taking differently: Take 5 mg by mouth 2 (two) times daily. ) 180 tablet 3  . calcitRIOL (ROCALTROL) 0.25 MCG capsule TAKE 0.25 MG BY MOUTH EVERY MON WED AND FRIDAY  5  . furosemide (LASIX) 40 MG tablet Take 1 tablet (40 mg total) by mouth 2 (two) times daily. 180 tablet 3  . gabapentin (NEURONTIN) 300 MG capsule TAKE ONE CAPSULE BY MOUTH AT BEDTIME (Patient taking differently: TAKE ONE CAPSULE (300mg )BY MOUTH AT BEDTIME) 90 capsule 0  . insulin NPH-regular Human (NOVOLIN 70/30) (70-30) 100 UNIT/ML injection Inject 55 Units into the skin daily at 12 noon.    Marland Kitchen losartan (COZAAR) 100 MG tablet Take 1 tablet (100 mg total) by mouth daily. 90 tablet 1  . metoprolol succinate (TOPROL-XL) 50 MG 24 hr tablet Take 1 tablet (50 mg total) 2 (two) times daily by mouth. Take with or immediately following a meal. 180 tablet 1  . Multiple Vitamin (MULTIVITAMIN) tablet Take 1 tablet by mouth daily.      . nitroGLYCERIN (NITROSTAT) 0.4 MG SL tablet Place 0.4 mg under the tongue every 5 (five) minutes as needed for chest pain. If no relief by 3rd tab, call 911     . Omega 3 1000 MG CAPS Take 1,000 mg by mouth 3 (three) times daily.     Marland Kitchen omeprazole (PRILOSEC) 40 MG capsule Take 1 capsule (40 mg total) by mouth daily. 30 capsule 0  . oxymorphone (OPANA) 5 MG tablet Take 5 mg by mouth every 6 (six) hours as needed for pain (max 3 tablets daily). Per Dr Brien Few    . PARoxetine (PAXIL) 40 MG tablet Take 1 tablet (40 mg total) by mouth every morning. 90 tablet 3  . potassium chloride SA (K-DUR,KLOR-CON) 20 MEQ tablet Take 1 tablet (20 mEq total) by mouth 2 (two) times daily. 180 tablet 3  . rosuvastatin (CRESTOR) 5 MG tablet Take 1 tablet (5 mg total) by mouth daily. 30 tablet 11    Home: Home Living Family/patient expects to be discharged to:: Private  residence Living Arrangements: Spouse/significant other Available Help at Discharge: Family, Available 24 hours/day Type of Home: House Home Access: Stairs to enter [employer] of Steps: 8 Entrance Stairs-Rails: Can reach both Home Layout: One level Bathroom Shower/Tub: Chiropodist: Standard Home Equipment: Electronics engineer Comments: unsure of the accuracy  Lives With: Spouse  Functional History: Prior Function Level of Independence: Independent Comments: ADLs and IADLs Functional Status:  Mobility: Bed Mobility Overal bed mobility: Needs Assistance Bed Mobility: Supine to Sit Supine to sit: Min guard, +2 for safety/equipment General bed mobility comments: Min Guard for safety. Required significant amount of time and cues for sequencing Transfers Overall transfer level: Needs assistance Equipment used: Rolling walker (2 wheeled) Transfers: Sit to/from Stand Sit to Stand: Min assist Stand pivot transfers: Mod assist, +2 physical assistance  General transfer comment: Cues to initiate with anterior weight shift, and for hand placement and safety; much improved rise Ambulation/Gait Ambulation/Gait assistance: Min assist, +2 safety/equipment Ambulation Distance (Feet): 60 Feet(Hallway amb) Assistive device: Rolling walker (2 wheeled) Gait Pattern/deviations: Step-through pattern, Decreased stride length, Trunk flexed General Gait Details: Heavily dependent on UE support from RW; Much improved step length and posture    ADL: ADL Overall ADL's : Needs assistance/impaired Eating/Feeding: Set up, Supervision/ safety, Sitting Grooming: Set up, Supervision/safety, Sitting Upper Body Bathing: Minimal assistance, Sitting Lower Body Bathing: Maximal assistance, Sit to/from stand Lower Body Bathing Details (indicate cue type and reason): Max A for standing balance with dynamic task Upper Body Dressing : Minimal assistance, Sitting Upper Body  Dressing Details (indicate cue type and reason): donned new gown Lower Body Dressing: Maximal assistance, Sit to/from stand Lower Body Dressing Details (indicate cue type and reason): Pt donning R sock with assistance for sitting balance and manininging ankle at knee. Pt required therapist to initate and don sock over toes, then pt bringing sock over heel. Requiring significant amount of time for task and processing.  Toilet Transfer: Moderate assistance, +2 for physical assistance, Stand-pivot, BSC, Cueing for safety, Cueing for sequencing Toilet Transfer Details (indicate cue type and reason): Mod A +2 for stand pivot to toilet for safety and balance. Pt requiring cues for sequencing.  Toileting- Clothing Manipulation and Hygiene: Maximal assistance, +2 for safety/equipment, Sit to/from stand, Cueing for sequencing Toileting - Clothing Manipulation Details (indicate cue type and reason): Pt with significant posterior lean during toileting.  Functional mobility during ADLs: Minimal assistance, Rolling walker, +2 for physical assistance General ADL Comments: Pt demonstraitng decreased funcitonal performance. Presenting with decreased balance, cognition, and ROM. Pt motivated to participate in therapy and return to PLOF  Cognition: Cognition Overall Cognitive Status: Impaired/Different from baseline Arousal/Alertness: Awake/alert Orientation Level: Oriented X4 Attention: Sustained Sustained Attention: Appears intact Memory: Impaired Memory Impairment: Decreased recall of new information Awareness: Appears intact Problem Solving: Impaired Problem Solving Impairment: Functional complex(0/5 serial subtraction) Safety/Judgment: Appears intact Comments: slow processing noted Cognition Arousal/Alertness: Awake/alert Behavior During Therapy: WFL for tasks assessed/performed Overall Cognitive Status: Impaired/Different from baseline Area of Impairment: Following commands, Safety/judgement, Problem  solving, Awareness, Attention Current Attention Level: Sustained Following Commands: Follows one step commands with increased time Safety/Judgement: Decreased awareness of safety Awareness: Emergent Problem Solving: Slow processing, Requires verbal cues, Requires tactile cues General Comments: Much improved   Blood pressure (!) 122/96, pulse (!) 58, temperature 98.2 F (36.8 C), temperature source Oral, resp. rate 18, height 6\' 1"  (1.854 m), weight 98.4 kg (217 lb), SpO2 96 %. Physical Exam  Nursing note and vitals reviewed. Constitutional: He is oriented to person, place, and time. He appears well-developed and well-nourished. No distress.  HENT:  Head: Normocephalic and atraumatic.  Mouth/Throat: Oropharynx is clear and moist.  Eyes: Conjunctivae and EOM are normal. Pupils are equal, round, and reactive to light.  Neck: Normal range of motion. Neck supple.  Cardiovascular: An irregularly irregular rhythm present.  Respiratory: Effort normal and breath sounds normal. No stridor. No respiratory distress. He has no wheezes.  GI: Soft. Bowel sounds are normal. He exhibits no distension. There is no tenderness.  Musculoskeletal: He exhibits no edema or tenderness.  Dry flaky skin bilateral shins. Left 2nd toe amp side well healed.   Neurological: He is alert and oriented to person, place, and time.  Slow measured speech but able to answer orientation questions without difficulty.  Able to follow simple  motor commands.  Motor: 5/5 grossly throughout  Skin: Skin is warm and dry. He is not diaphoretic.  Psychiatric: He has a normal mood and affect. He is slowed. He does not express impulsivity or inappropriate judgment.    Results for orders placed or performed during the hospital encounter of 06/12/17 (from the past 24 hour(s))  Glucose, capillary     Status: Abnormal   Collection Time: 06/16/17 12:18 PM  Result Value Ref Range   Glucose-Capillary 375 (H) 65 - 99 mg/dL   Comment 1  Notify RN    Comment 2 Document in Chart   Glucose, capillary     Status: Abnormal   Collection Time: 06/16/17  5:05 PM  Result Value Ref Range   Glucose-Capillary 304 (H) 65 - 99 mg/dL  Glucose, capillary     Status: Abnormal   Collection Time: 06/16/17 10:15 PM  Result Value Ref Range   Glucose-Capillary 322 (H) 65 - 99 mg/dL  Basic metabolic panel     Status: Abnormal   Collection Time: 06/17/17  4:09 AM  Result Value Ref Range   Sodium 135 135 - 145 mmol/L   Potassium 3.3 (L) 3.5 - 5.1 mmol/L   Chloride 101 101 - 111 mmol/L   CO2 20 (L) 22 - 32 mmol/L   Glucose, Bld 248 (H) 65 - 99 mg/dL   BUN 16 6 - 20 mg/dL   Creatinine, Ser 1.98 (H) 0.61 - 1.24 mg/dL   Calcium 8.5 (L) 8.9 - 10.3 mg/dL   GFR calc non Af Amer 34 (L) >60 mL/min   GFR calc Af Amer 39 (L) >60 mL/min   Anion gap 14 5 - 15  Glucose, capillary     Status: Abnormal   Collection Time: 06/17/17  7:52 AM  Result Value Ref Range   Glucose-Capillary 216 (H) 65 - 99 mg/dL   No results found.  Assessment/Plan: Diagnosis: Seizures  Labs and images independently reviewed.  Records reviewed and summated above.  1. Does the need for close, 24 hr/day medical supervision in concert with the patient's rehab needs make it unreasonable for this patient to be served in a less intensive setting? Potentially  2. Co-Morbidities requiring supervision/potential complications: mild cognitive impairment with dysarthria with delayed processing (continue SLP), T2 DM (Monitor in accordance with exercise and adjust meds as necessary), CAD (continue meds), CAF (continue meds, monitor heart rate with increased physical activity), HTN (monitor and provide prns in accordance with increased physical exertion and pain), CKD (Continue to monitor, avoid nephrotoxic meds) , OSA (monitor for daytime somnolence), hypokalemia (continue to monitor and replete as necessary), Thrombocytopenia (< 60,000/mm3 no resistive exercise) 3. Due to safety, disease  management and patient education, does the patient require 24 hr/day rehab nursing? Potentially 4. Does the patient require coordinated care of a physician, rehab nurse, PT (1-2 hrs/day, 5 days/week), OT (1-2 hrs/day, 5 days/week) and SLP (1-2 hrs/day, 5 days/week) to address physical and functional deficits in the context of the above medical diagnosis(es)? Potentially Addressing deficits in the following areas: balance, endurance, locomotion, transferring, bathing, dressing, toileting, cognition and psychosocial support 5. Can the patient actively participate in an intensive therapy program of at least 3 hrs of therapy per day at least 5 days per week? Yes 6. The potential for patient to make measurable gains while on inpatient rehab is excellent 7. Anticipated functional outcomes upon discharge from inpatient rehab are modified independent and supervision  with PT, modified independent and supervision with OT, modified independent  and supervision with SLP. 8. Estimated rehab length of stay to reach the above functional goals is: 7-10 days. 9. Anticipated D/C setting: Home 10. Anticipated post D/C treatments: HH therapy and Home excercise program 11. Overall Rehab/Functional Prognosis: good  RECOMMENDATIONS: This patient's condition is appropriate for continued rehabilitative care in the following setting: Potentially CIR.  Patient made good progress with therapies as his postictal state has resolved.  Will follow for progress in therapies today.  If patient makes good progress with therapies today, anticipate he will be back to baseline soon. Patient has agreed to participate in recommended program. Potentially Note that insurance prior authorization may be required for reimbursement for recommended care.  Comment: Rehab Admissions Coordinator to follow up.  Delice Lesch, MD, ABPMR Bary Leriche, PA-C 06/17/2017

## 2017-06-17 NOTE — Progress Notes (Signed)
Inpatient Diabetes Program Recommendations  AACE/ADA: New Consensus Statement on Inpatient Glycemic Control (2015)  Target Ranges:  Prepandial:   less than 140 mg/dL      Peak postprandial:   less than 180 mg/dL (1-2 hours)      Critically ill patients:  140 - 180 mg/dL  Results for BELVIN, GAUSS (MRN [id number]) as of 06/17/2017 13:03  Ref. Range 06/16/2017 07:45 06/16/2017 12:18 06/16/2017 17:05 06/16/2017 22:15 06/17/2017 07:52 06/17/2017 12:02  Glucose-Capillary Latest Ref Range: 65 - 99 mg/dL 222 (H) 375 (H) 304 (H) 322 (H) 216 (H) 276 (H)    Review of Glycemic Control  Current orders for Inpatient glycemic control: Lantus 16 units QHS, Novolog 0-15 units TID with meals  Inpatient Diabetes Program Recommendations: Insulin - Basal: Noted Lantus increased from 8 to 16 units today. Correction (SSI): Please consider ordering Novolog 0-5 units QHS for bedtime correction scale. Insulin - Meal Coverage: If post prandial glucose continues to be consistently greater than 180 mg/dl, please consider ordering Novolog 3 units TID with meals for meal coverage.  Thanks, Barnie Alderman, RN, MSN, CDE Diabetes Coordinator Inpatient Diabetes Program [phone] (Team Pager from 8am to 5pm)

## 2017-06-17 NOTE — Care Management Important Message (Signed)
Important Message  Patient Details  Name: Anthony [name] MRN: [id number] Date of Birth: 07-23-1950   Medicare Important Message Given:  Yes    Kea Callan [address]ine Circle 06/17/2017, 3:35 PM

## 2017-06-17 NOTE — NC FL2 (Signed)
[place] LEVEL OF CARE SCREENING TOOL     IDENTIFICATION  Patient Name: Anthony [name] Birthdate: [birth year] Sex: male Admission Date (Current Location): 06/12/2017  [facility] and [place] Number:  Herbalist and Address:  [facility], [place] [address], [place], [place] [address]      Provider Number: [id number]  Attending Physician Name and Address:  Dickie La, MD  Relative Name and Phone Number:       Current Level of Care: Hospital Recommended Level of Care: [place] Prior Approval Number:    Date Approved/Denied:   PASRR Number: [id number]  Discharge Plan: SNF    Current Diagnoses: Patient Active Problem List   Diagnosis Date Noted  . Cognitive deficits   . Essential hypertension   . Stage 3 chronic kidney disease ([place])   . OSA (obstructive sleep apnea)   . Hypokalemia   . Thrombocytopenia ([place])   . Right temporal lobe infarction 06/13/2017  . Paranasal sinus disease 06/13/2017  . Atrial fibrillation with rapid ventricular response ([place]) 06/13/2017  . Acute renal failure superimposed on stage 3 chronic kidney disease ([place]) 06/13/2017  . Seizures ([place]) 06/12/2017  . Sebaceous cyst of breast, left 04/25/2017  . CKD stage 3 due to type 2 diabetes mellitus ([place]) 01/22/2017  . Secondary hyperparathyroidism of renal origin ([place]) 01/22/2017  . Chronic anticoagulation 03/06/2016  . Lower extremity edema 06/28/2015  . Sebaceous cyst 04/27/2015  . Health care maintenance 02/19/2015  . Chronic, continuous use of opioids 10/18/2014  . Acute combined systolic and diastolic CHF, NYHA class 2 ([place]) 08/31/2014  . OSA on CPAP 04/04/2014  . Chronic atrial fibrillation ([place]) 11/09/2013  . Tobacco abuse 06/28/2013  . Left foot pain 01/13/2013  . Low testosterone 11/06/2011  . Special screening for malignant neoplasms, colon 11/06/2011  . Chronic pain syndrome 08/01/2011  . Athlete's foot 02/28/2011  .  ONYCHOMYCOSIS, TOENAILS 11/07/2008  . DIABETIC  RETINOPATHY 02/24/2008  . VENOUS INSUFFICIENCY, LEGS 12/18/2007  . NEUROPATHY, DIABETIC 07/24/2006  . IDDM (insulin dependent diabetes mellitus) ([place]) 07/24/2006  . HYPERCHOLESTEROLEMIA 07/24/2006  . OBESITY, NOS 07/24/2006  . ERECTILE DYSFUNCTION 07/24/2006  . DEPRESSIVE DISORDER, NOS 07/24/2006  . HYPERTENSION, BENIGN SYSTEMIC 07/24/2006  . Coronary atherosclerosis 07/24/2006  . GASTROESOPHAGEAL REFLUX, NO ESOPHAGITIS 07/24/2006    Orientation RESPIRATION BLADDER Height & Weight     Self, Time, Situation, Place  Normal Incontinent, External catheter Weight: 217 lb (98.4 kg) Height:  6' 1"$  (185.4 cm)  BEHAVIORAL SYMPTOMS/MOOD NEUROLOGICAL BOWEL NUTRITION STATUS      Continent Diet  AMBULATORY STATUS COMMUNICATION OF NEEDS Skin   Limited Assist Verbally Normal                       Personal Care Assistance Level of Assistance  Bathing, Dressing Bathing Assistance: Limited assistance   Dressing Assistance: Limited assistance     Functional Limitations Info             SPECIAL CARE FACTORS FREQUENCY  PT (By licensed PT), OT (By licensed OT)     PT Frequency: 5/wk OT Frequency: 5/wk            Contractures      Additional Factors Info  Code Status, Allergies, Insulin Sliding Scale Code Status Info: FULL Allergies Info: Enalapril Maleate   Insulin Sliding Scale Info: 4/day       Current Medications (06/17/2017):  This is the current hospital active medication  list Current Facility-Administered Medications  Medication Dose Route Frequency Provider Last Rate Last Dose  . apixaban (ELIQUIS) tablet 5 mg  5 mg Oral BID Renee Pain, MD   5 mg at 06/17/17 0850  . bisacodyl (DULCOLAX) suppository 10 mg  10 mg Rectal Daily PRN Jennelle Human B, NP      . docusate (COLACE) 50 MG/5ML liquid 100 mg  100 mg Oral BID PRN Rigoberto Noel, MD      . furosemide (LASIX) tablet 40 mg  40 mg Oral BID Renee Pain,  MD   40 mg at 06/17/17 0851  . gabapentin (NEURONTIN) capsule 300 mg  300 mg Oral QHS Renee Pain, MD   300 mg at 06/16/17 2133  . hydrALAZINE (APRESOLINE) injection 10-40 mg  10-40 mg Intravenous Q4H PRN Anders Simmonds, MD   20 mg at 06/15/17 2058  . insulin aspart (novoLOG) injection 0-15 Units  0-15 Units Subcutaneous TID WC Renee Pain, MD   5 Units at 06/17/17 [phone]  . insulin glargine (LANTUS) injection 8 Units  8 Units Subcutaneous QHS Sampson Goon, MD   8 Units at 06/16/17 2233  . living well with diabetes book MISC   Does not apply Once Dickie La, MD      . LORazepam (ATIVAN) injection 2 mg  2 mg Intravenous Q4H PRN Jennelle Human B, NP      . metoprolol succinate (TOPROL-XL) 24 hr tablet 100 mg  100 mg Oral BID WC Renee Pain, MD   100 mg at 06/17/17 0850  . multivitamin with minerals tablet 1 tablet  1 tablet Oral Daily Renee Pain, MD   1 tablet at 06/17/17 0851  . nitroGLYCERIN (NITROSTAT) SL tablet 0.4 mg  0.4 mg Sublingual Q5 min PRN Renee Pain, MD      . ondansetron [facility] [address]) injection 4 mg  4 mg Intravenous Q6H PRN Kathi Ludwig, MD      . pantoprazole (PROTONIX) injection 40 mg  40 mg Intravenous QHS Jennelle Human B, NP   40 mg at 06/16/17 2133  . PARoxetine (PAXIL) tablet 40 mg  40 mg Oral q morning - 10a Renee Pain, MD   40 mg at 06/17/17 0851  . polyethylene glycol (MIRALAX / GLYCOLAX) packet 17 g  17 g Oral Daily Dickie La, MD      . potassium chloride SA (K-DUR,KLOR-CON) CR tablet 20 mEq  20 mEq Oral BID Renee Pain, MD   20 mEq at 06/17/17 0850  . [START ON 06/18/2017] rosuvastatin (CRESTOR) tablet 40 mg  40 mg Oral Daily Winfrey, Alcario Drought, MD         Discharge Medications: Please see discharge summary for a list of discharge medications.  Relevant Imaging Results:  Relevant Lab Results:   Additional Information SS#: [id number]  Jorge Ny, LCSW

## 2017-06-17 NOTE — Consult Note (Signed)
           [facility] CM Primary Care Navigator  06/17/2017  Anthony [name] [birth year] [id number]   Seen patientat the bedside to identify possible discharge needs.  Patient reports that he had "seizure" which per MD note, was thought to be due to hyperglycemia and electrolyte derangements and possibly from small acute stroke that resulted to this admission.  Patient endorsesDr. Sela Hilding with [facility] as his primary care provider.    [facility] [address] [facility] [place] without any problem for now.  Per chart review, patient's wife stated she feels that patient ran out of his diabetes medications due to 2018 insurance coverage and wife was not aware until the day of admission.  Patient was made aware to contact primary care provider's office to get assistance with medication if he happens to have any issues with insurance coverage on his medications again. He is also aware that primary care provider can refer patient to [place] services for medication assistance whenever needed.    Patient states that he manages his own medications at [facility] use of pill box system filled every week.   Patient reports that he was driving prior to admission, however, son Anthony [name]), daughter Anthony [name]) orwife Anthony [name]) willbe able toprovide transportation to his doctors'appointments after discharge if needed.   Patientstates that he lives with wife at home who serves as his primary caregiver.  [facility] by therapy prior to returning home.   Patient voiced understanding to call primary care provider's office when he returns back home, for a post discharge follow-up within 1-2 weeks or sooner if needs arise. Patient letter (with PCP's contact number) was provided asareminder.   Discussed with patient regarding [facility] CM  services available for health management and resources at home (mainly DM and HF) and patient expressed his interest about it. Patient voiced understanding of need to seek referral from primary care provider to [facility] care management as deemed necessary and appropriate for services in the near future- once discharged home.   [facility] care management information was provided for future needs that he may have.   For questions, please contact:  Dannielle Huh, BSN, RN- [facility] Primary Care Navigator  Telephone: [phone] [place]

## 2017-06-17 NOTE — Clinical Social Work Note (Signed)
Clinical Social Work Assessment  Patient Details  Name: Anthony [name] MRN: [id number] Date of Birth: April 29, 1951  Date of referral:  06/17/17               Reason for consult:  Facility Placement                Permission sought to share information with:  Chartered certified accountant granted to share information::  Yes, Verbal Permission Granted  Name::     Interior and spatial designer::  SNF  Relationship::  wife  Contact Information:     Housing/Transportation Living arrangements for the past 2 months:  [place] of Information:  Patient Patient Interpreter Needed:  None Criminal Activity/Legal Involvement Pertinent to Current Situation/Hospitalization:  No - Comment as needed Significant Relationships:  Spouse Lives with:  Spouse Do you feel safe going back to the place where you live?  Yes Need for family participation in patient care:  No (Coment)  Care giving concerns:  Pt lives at home with wife per notes but pt reports living alone- patient does not think he would be safe to return to home living environment given current weakness   Facilities manager / plan:  CSW spoke with pt concerning PT recommendation for rehab prior to return home.  CSW explained difference between CIR and SNF and explained SNF as possible back up option if CIR unable to admit.  Employment status:  Retired Nurse, adult PT Recommendations:  Inpatient [place] / Referral to community resources:  [place]  Patient/Family's Response to care:  Pt agreeable to SNF as back up to CIR but hopeful for CIR stay.  Patient/Family's Understanding of and Emotional Response to Diagnosis, Current Treatment, and Prognosis:  No questions or concerns at this time- hopeful that he will be well enough to return home soon.  Emotional Assessment Appearance:  Appears stated age Attitude/Demeanor/Rapport:    Affect (typically observed):   Appropriate Orientation:  Oriented to Self, Oriented to Place, Oriented to  Time, Oriented to Situation Alcohol / Substance use:  Not Applicable Psych involvement (Current and /or in the community):  No (Comment)  Discharge Needs  Concerns to be addressed:  Care Coordination Readmission within the last 30 days:  No Current discharge risk:  Physical Impairment Barriers to Discharge:  Continued Medical Work up   Jorge Ny, LCSW 06/17/2017, 10:36 AM

## 2017-06-17 NOTE — Progress Notes (Signed)
Family Medicine Teaching Service Daily Progress Note Intern Pager: [phone]  Patient name: Anthony [name] Medical record number: [id number] Date of birth: [birth year] Age: 67 y.o. Gender: male  Primary Care Provider: Sela Hilding, MD Consultants: Neurology, CCM, PMH Code Status: full  Pt Overview and Major Events to Date:  1/17 Admitted to CCM, intubated 1/18 extubated 1/22 transferred to family medicine   Assessment and Plan: WIN Anthony [name] is a 67 year old male who presented with new-onset seizure due to hyperglycemia >600.  PMH includes atrial fibrillation with RVR on Eliquis, CVA, CAD, CHF, T2DM, CKD Stage III, HTN, Depression, long-QT syndrome.  New-onset seizure: No longer on anti-convulsant since seizure thought to be due to hyperglycemia and electrolyte derangements (hyponatremia of 131, hypochloremia).  However, very small acute stroke was visualized near right lateral ventricle on MRI, so the stroke may have also contributed to patient's seizure.  Another possible contributor to patient's seizure according to neurology includes a chronic hemosiderin stained infarction as the seizure focus.  Was intubated on 1/17 for airway protection during postictal state and given Keppra and Ativan for seizure control.  Extubated on 1/18.  No longer on anticonvulsants since he has not had a seizure since 1/17.   - consulting CIR for further rehab - PT/OT  - if patient develops seizures again, will need long-term anticonvulsant - blood sugar control, detailed below  Atrial fibrillation with RVR: Patient on Eliquis 5 mg BID, Toprol XL. - continue Eliquis - change Toprol XL 100 mg BID to daily since this is a once daily medication  CVA: Patient with an acute punctate CVA in R temporal region as well as several old infarcts and moderate to severe chronic microvascular ischemic changes with moderate parenchymal volume loss. Patient may have vascular dementia given these findings, but his  baseline is currently unknown to me.  - consult with PCP Dr. Lindell Noe about patient's baseline cognition - increase Crestor dose from 5 mg to 20 mg - consider referral to geriatric clinic for neurocognitive testing  CAD: history of stent placed in 2003.   - continue Toprol as above  CHF: Last echo on 11/21/16 with EF 25-30%, low voltage and left ventricular hypertrophy concerning for possible amyloidosis.  Further imaging with pyrophosphate showed only grade 1 findings.  Was prescribed Losartan outpatient, but this was held inpatient due to AKI.  ICD placement is a possibility for the near future. - continue Toprol - restart Losartan once patient's creatinine returns to baseline - Lasix 40 mg PO BID  T2DM: Hemoglobin 06/12/17 was 8.6.  On insulin NPH-regular 70/30 55 units daily at home.  Glucose was in mid-200s to mid-300s overnight with 38 units of insulin required over past day. - increase Lantus to 16 units daily - continue moderate SSI  AKI on CKD Stage III: Baseline appears to ~1.6.  Creatinine at presentation 3.30.  Creatinine continues to trend down and is 1.98 on 1/22. - daily BMP - restart Losartan when creatinine gets closer to baseline  HTN: Patient's BP is 143/103 on 1/22.  Currently controlled with IV hydralazine 10-40 mg Q4H PRN for SBP >160, DBP >100. - continue hydralazine for BP control  Hypokalemia: Patient's potassium has been low during hospitalization, likely due to Lasix and concurrent hypomagnesemia.  Potassium 3.3 on 1/22.  Replacement includes Kdur 20 mg BID. - continue Kdur supplementation  Hypomagnesemia: Patient's magnesium also low, down to 1.4 on 1/22.   - replete with 2 g MgSO4 as needed - recheck Mg at  2000 on 1/22  Depression: Patient taking Paxil 40 mg daily, which is not a recommended SSRI in the elderly.   - will consult with PCP regarding the use of this medication - will possibly switch to different SSRI depending on PCP  recommendation  Long-QT syndrome: Patient with QT length of 550 on EKG - avoid QT-prolonging medications  FEN/GI: carb-modified diet PPx: not indicated given Eliquis therapy  Disposition: hopeful transfer to CIR  Subjective:  Anthony [name] was in good spirits today and denies pain or discomfort.  He is asking when his cardiac procedure is being done.  Objective: Temp:  [97.7 F (36.5 C)-98.6 F (37 C)] 98.6 F (37 C) (01/22 0840) Pulse Rate:  [58-104] 99 (01/22 0850) Resp:  [18-23] 18 (01/22 0840) BP: (114-167)/(82-112) 143/103 (01/22 0840) SpO2:  [76 %-100 %] 96 % (01/22 0840) Weight:  [217 lb (98.4 kg)] 217 lb (98.4 kg) (01/22 0434) Physical Exam  Constitutional: He appears well-developed and well-nourished. No distress.  HENT:  Head: Normocephalic and atraumatic.  Eyes: Conjunctivae and EOM are normal.  Neck: Normal range of motion. Neck supple.  Cardiovascular: Normal rate.  Irregular rhythm  Pulmonary/Chest: Effort normal and breath sounds normal.  Abdominal: Soft. Bowel sounds are normal. There is no tenderness.  Musculoskeletal: Normal range of motion.  Neurological: He is alert.  Skin: Skin is warm and dry.  Psychiatric: He has a normal mood and affect. His behavior is normal.     Laboratory: Recent Labs  Lab 06/12/17 2115  06/12/17 2342 06/13/17 0233 06/16/17 0341  WBC 8.5  --   --  6.6 8.4  HGB 14.9   < > 13.3 13.2 14.4  HCT 44.4   < > 39.0 37.9* 40.8  PLT 144*  --   --  121* 110*   < > = values in this interval not displayed.   Recent Labs  Lab 06/12/17 2115  06/15/17 0725 06/16/17 0341 06/17/17 0409  NA 131*   < > 141 139 135  K 4.4   < > 3.4* 3.3* 3.3*  CL 94*   < > 107 102 101  CO2 14*   < > 22 22 20*  BUN 27*   < > 15 15 16   CREATININE 3.30*   < > 2.17* 2.10* 1.98*  CALCIUM 8.8*   < > 8.9 9.0 8.5*  PROT 6.6  --   --   --   --   BILITOT 1.1  --   --   --   --   ALKPHOS 120  --   --   --   --   ALT 34  --   --   --   --   AST 48*  --    --   --   --   GLUCOSE 663*   < > 133* 199* 248*   < > = values in this interval not displayed.    Imaging/Diagnostic Tests: Ct Head Wo Contrast  Result Date: 06/12/2017 CLINICAL DATA:  Seizure EXAM: CT HEAD WITHOUT CONTRAST TECHNIQUE: Contiguous axial images were obtained from the base of the skull through the vertex without intravenous contrast. COMPARISON:  None. FINDINGS: Brain: Moderate atrophy. Moderate to marked small vessel ischemic changes of the white matter. Encephalomalacia in the left frontal lobe consistent with old infarct. Gyriform areas of increased density at the left insula and inferior frontal lobe. Ventricles are nonenlarged. No mass lesion. Vascular: No hyperdense vessels.  Carotid artery calcification. Skull: No fracture. Sinuses/Orbits:  Mucosal thickening and retention cysts in the maxillary sinuses, mucosal thickening in the ethmoid sinuses. No acute orbital abnormality. Other: None IMPRESSION: 1. Gyriform areas of increased density along the left insula and inferior left frontal lobe, favored to represent cortical calcifications as opposed to hemorrhage, this could be related to sequela of prior necrosis or infection. Evidence of old left frontal lobe infarct. 2. Atrophy and fairly extensive small vessel ischemic changes of the white matter. 3. Sinus disease. Electronically Signed   By: Donavan Foil M.D.   On: 06/12/2017 22:11   Mr Brain Wo Contrast  Result Date: 06/13/2017 CLINICAL DATA:  67 y/o  M; new seizure. EXAM: MRI HEAD WITHOUT CONTRAST TECHNIQUE: Multiplanar, multiecho pulse sequences of the brain and surrounding structures were obtained without intravenous contrast. COMPARISON:  06/12/2017 CT head. FINDINGS: Brain: Punctate focus of reduced diffusion in right temporal periventricular white matter compatible with acute/early subacute infarction. No acute hemorrhage or mass effect. Chronic hemosiderin stained anterolateral frontal lobe infarction. Extensive  patchynonspecific foci of T2 FLAIR hyperintense signal abnormality in subcortical and periventricular white matter are compatible withmoderate to severechronic microvascular ischemic changes for age. Moderatebrain parenchymal volume loss. Small chronic lacunar infarct in left thalamus. No hydrocephalus, effacement of basilar cisterns, or extra-axial collection. Vascular: Normal flow voids. Skull and upper cervical spine: Normal marrow signal. Sinuses/Orbits: Diffuse paranasal sinus mucosal thickening, partial opacification of ethmoid air cells, and left maxillary sinus mucous retention cyst. No abnormal signal of mastoid air cells. Bilateral intra-ocular lens replacement. Other: None. IMPRESSION: 1. Punctate acute/early subacute infarct within right temporal periventricular white matter. No associated hemorrhage or mass effect. 2. Moderate to severe chronic microvascular ischemic changes and moderate parenchymal volume loss of the brain. 3. Chronic hemosiderin stained infarction in left anterolateral frontal lobe. Small chronic lacunar infarct in left thalamus. 4. Moderate paranasal sinus disease. Electronically Signed   By: Kristine Garbe M.D.   On: 06/13/2017 06:02   Dg Chest Port 1 View  Result Date: 06/13/2017 CLINICAL DATA:  Seizure. EXAM: PORTABLE CHEST 1 VIEW COMPARISON:  Radiograph of June 12, 2017. FINDINGS: Stable cardiomegaly. Endotracheal and nasogastric tubes are unchanged in position. No pneumothorax or pleural effusion is noted. Mild bibasilar subsegmental atelectasis is noted. Bony thorax is unremarkable. Hypoinflation of the lungs is noted. IMPRESSION: Stable support apparatus. Hypoinflation of the lungs is noted with mild bibasilar subsegmental atelectasis. Electronically Signed   By: Marijo Conception, M.D.   On: 06/13/2017 09:47   Dg Chest Portable 1 View  Result Date: 06/12/2017 CLINICAL DATA:  Intubation. EXAM: PORTABLE CHEST 1 VIEW COMPARISON:  August 28, 2005 FINDINGS: The  mediastinal contour is normal. The heart size is enlarged. Endotracheal tube is identified distal tip 3.9 cm from carina. There is no pleural line to suggest pneumothorax. There is mild interstitial edema. There is no focal pneumonia or pleural effusion. Nasogastric tube is identified distal tip in the proximal stomach. The bony structures are unremarkable. IMPRESSION: Endotracheal tube in good position. There is no pneumothorax. Nasogastric tube distal tip in the proximal stomach. Mild interstitial edema. Electronically Signed   By: Abelardo Diesel M.D.   On: 06/12/2017 21:50    Kathrene Alu, MD 06/17/2017, 9:37 AM PGY-1, [place] Intern pager: [phone], text pages welcome

## 2017-06-17 NOTE — Progress Notes (Signed)
Physical Therapy Treatment Patient Details Name: Anthony [name] MRN: [id number] DOB: 10-30-1950 Today's Date: 06/17/2017    History of Present Illness 67 year old male with new onset seizure. Intubated in ER. Past medical history significant for chronic atrial fibrillation on Eliquis, CAD, insulin-dependent diabetes mellitus, hypertension, CHF, OSA on CPAP, GERD, secondary hyperparathyroidism, CKD stage III. MRI brain showed a subcentimeter focus of acute infarction in the white matter adjacent to the right lateral ventricle, likely small vessel disease however small cardioembolic stroke is also possible per MD. Intubated 06/12/17-06/13/17.     PT Comments    Patient received in bed, pleasant and willing to work with skilled PT services today. He is able to complete bed mobility with S for safety this session, and was able to perform functional transfers with Min guard; he was also able to gait train approximately 6f with min guard, 1 time instance of MinA due to mild loss of balance but otherwise able to correct. Attempted to monitor HR/O2 during dynamic gait, however monitor not accurate- found HR to be 141BPM during static rest at end of gait period, O2 89% on room air but recovered to 90s with seated rest/pursed lip breathing.  He was left up in the chair with all needs met, chair alarm activated this afternoon.   Follow Up Recommendations  CIR     Equipment Recommendations  Rolling walker with 5" wheels;3in1 (PT)    Recommendations for Other Services       Precautions / Restrictions Precautions Precautions: Fall Restrictions Weight Bearing Restrictions: No    Mobility  Bed Mobility Overal bed mobility: Needs Assistance Bed Mobility: Supine to Sit     Supine to sit: Supervision     General bed mobility comments: S for bed mobility, extended time and effort noted   Transfers Overall transfer level: Needs assistance Equipment used: Rolling walker (2 wheeled) Transfers:  Sit to/from Stand Sit to Stand: Min guard         General transfer comment: VC for safety/sequencing, patient able to power up well from surface of bed with no additional assistance provided by PT   Ambulation/Gait Ambulation/Gait assistance: Min guard;Min assist Ambulation Distance (Feet): 90 Feet Assistive device: Rolling walker (2 wheeled) Gait Pattern/deviations: Step-through pattern;Decreased stride length;Trunk flexed     General Gait Details: continues to be reliant on UE support from RW, one time mild LOB that required MinA to correct, otherwise slow but steady    SScience writer   Modified Rankin (Stroke Patients Only)       Balance Overall balance assessment: Needs assistance Sitting-balance support: Bilateral upper extremity supported;Feet supported Sitting balance-Leahy Scale: Good     Standing balance support: During functional activity;Bilateral upper extremity supported Standing balance-Leahy Scale: Fair Standing balance comment: reliant on UE support                             Cognition Arousal/Alertness: Awake/alert Behavior During Therapy: WFL for tasks assessed/performed Overall Cognitive Status: Impaired/Different from baseline Area of Impairment: Following commands;Safety/judgement;Problem solving;Awareness;Attention                   Current Attention Level: Sustained   Following Commands: Follows one step commands with increased time Safety/Judgement: Decreased awareness of safety Awareness: Emergent Problem Solving: Slow processing;Requires verbal cues;Requires tactile cues General Comments: Much improved       Exercises  General Comments General comments (skin integrity, edema, etc.): HR max 141 observed; attempted to observe during dynamic gait, monitor only gave accurate reading following static rest at end of gait distance      Pertinent Vitals/Pain Pain Assessment: No/denies  pain Faces Pain Scale: No hurt Pain Intervention(s): Limited activity within patient's tolerance;Monitored during session    Home Living                      Prior Function            PT Goals (current goals can now be found in the care plan section) Acute Rehab PT Goals Patient Stated Goal: Walk again PT Goal Formulation: With patient Time For Goal Achievement: 06/29/17 Potential to Achieve Goals: Good Progress towards PT goals: Progressing toward goals    Frequency    Min 4X/week      PT Plan Current plan remains appropriate    Co-evaluation              AM-PAC PT "6 Clicks" Daily Activity  Outcome Measure  Difficulty turning over in bed (including adjusting bedclothes, sheets and blankets)?: Unable Difficulty moving from lying on back to sitting on the side of the bed? : Unable Difficulty sitting down on and standing up from a chair with arms (e.g., wheelchair, bedside commode, etc,.)?: Unable Help needed moving to and from a bed to chair (including a wheelchair)?: A Little Help needed walking in hospital room?: A Little Help needed climbing 3-5 steps with a railing? : A Lot 6 Click Score: 11    End of Session Equipment Utilized During Treatment: Gait belt Activity Tolerance: Patient tolerated treatment well Patient left: in chair;with chair alarm set;with call bell/phone within reach   PT Visit Diagnosis: Unsteadiness on feet (R26.81);Other abnormalities of gait and mobility (R26.89);Muscle weakness (generalized) (M62.81)     Time: 1610-9604 PT Time Calculation (min) (ACUTE ONLY): 17 min  Charges:  $Gait Training: 8-22 mins                    G Codes:       Deniece Ree PT, DPT, CBIS  Supplemental Physical Therapist [place]   Pager [phone]

## 2017-06-17 NOTE — Progress Notes (Signed)
  Speech Language Pathology Treatment: Dysphagia;Cognitive-Linquistic  Patient Details Name: Anthony [name] MRN: [id number] DOB: 1950/11/23 Today's Date: 06/17/2017 Time: 7510-2585 SLP Time Calculation (min) (ACUTE ONLY): 17 min  Assessment / Plan / Recommendation Clinical Impression  Pt consumed regular textures and thin liquids with mildly prolonged mastication but no other overt signs of difficulty. Suspect that this is likely at or near his baseline given that he is edentulous and prior notes indicate that he preferred slightly softer textures at baseline. SLP to sign off for dysphagia treatment.   Cognitively he continues to make progress - he is oriented x4, although he is tangential and needs Min cues for sustained attention to conversation. His processing and speech are both mildly delayed - he thinks that this is not his baseline but he is not sure. SLP also provided Min-Mod cues for retrieval of new information/daily events. Continue to recommend CIR for cognitive and communicative treatment.   HPI HPI: 67 year old male with new onset seizure. Intubated in ER. Past medical history significant for chronic atrial fibrillation on Eliquis, CAD, insulin-dependent diabetes mellitus, hypertension, CHF, OSA on CPAP, GERD, secondary hyperparathyroidism, CKD stage III.MRI brain showed a subcentimeter focus of acute infarction in the white matter adjacent to the right lateral ventricle, likely small vessel disease however small cardioembolic stroke is also possible per MD. Intubated 06/12/17-06/13/17.      SLP Plan  Goals updated       Recommendations  Diet recommendations: Regular;Thin liquid Liquids provided via: Cup;Straw Medication Administration: Whole meds with liquid Supervision: Patient able to self feed;Intermittent supervision to cue for compensatory strategies Compensations: Slow rate;Small sips/bites;Minimize environmental distractions Postural Changes and/or Swallow Maneuvers:  Seated upright 90 degrees                Oral Care Recommendations: Oral care BID Follow up Recommendations: Inpatient Rehab SLP Visit Diagnosis: Cognitive communication deficit (R41.841);Dysphagia, unspecified (R13.10) Plan: Goals updated       GO                Anthony [name] 06/17/2017, 2:28 PM  Anthony [name], M.A. CCC-SLP [phone]

## 2017-06-17 NOTE — Progress Notes (Signed)
I met with pt at bedside and then contacted his wife by phone to discuss his baseline function. Pt was independent until 2 weeks pta. She states she feels he ran out of his diabetes meds due to 2018 insurance coverage and she was not aware until the day of admission. She states he is improved but not at his baseline yet. She requests I pursue insurance approval for a possible inpt rehab admit. I will begin today. admission pending insurance approval, MD acceptance and bed availability. [phone]

## 2017-06-18 ENCOUNTER — Inpatient Hospital Stay (HOSPITAL_COMMUNITY): Payer: Medicare Other

## 2017-06-18 ENCOUNTER — Encounter (HOSPITAL_COMMUNITY): Payer: Self-pay | Admitting: General Practice

## 2017-06-18 LAB — BASIC METABOLIC PANEL
Anion gap: 13 (ref 5–15)
BUN: 15 mg/dL (ref 6–20)
CO2: 23 mmol/L (ref 22–32)
Calcium: 8.7 mg/dL — ABNORMAL LOW (ref 8.9–10.3)
Chloride: 102 mmol/L (ref 101–111)
Creatinine, Ser: 1.92 mg/dL — ABNORMAL HIGH (ref 0.61–1.24)
GFR calc Af Amer: 41 mL/min — ABNORMAL LOW (ref >60)
GFR calc non Af Amer: 35 mL/min — ABNORMAL LOW (ref >60)
Glucose, Bld: 178 mg/dL — ABNORMAL HIGH (ref 65–99)
Potassium: 3.4 mmol/L — ABNORMAL LOW (ref 3.5–5.1)
Sodium: 138 mmol/L (ref 135–145)

## 2017-06-18 LAB — GLUCOSE, CAPILLARY
Glucose-Capillary: 183 mg/dL — ABNORMAL HIGH (ref 65–99)
Glucose-Capillary: 239 mg/dL — ABNORMAL HIGH (ref 65–99)
Glucose-Capillary: 278 mg/dL — ABNORMAL HIGH (ref 65–99)
Glucose-Capillary: 307 mg/dL — ABNORMAL HIGH (ref 65–99)
Glucose-Capillary: 310 mg/dL — ABNORMAL HIGH (ref 65–99)
Glucose-Capillary: 331 mg/dL — ABNORMAL HIGH (ref 65–99)
Glucose-Capillary: 352 mg/dL — ABNORMAL HIGH (ref 65–99)

## 2017-06-18 LAB — MAGNESIUM: Magnesium: 1.8 mg/dL (ref 1.7–2.4)

## 2017-06-18 MED ORDER — FLUOXETINE HCL 20 MG PO CAPS
20.0000 mg | ORAL_CAPSULE | Freq: Every day | ORAL | Status: DC
  Administered 2017-06-19 – 2017-06-20 (×2): 20 mg via ORAL
  Filled 2017-06-18 (×2): qty 1

## 2017-06-18 MED ORDER — INSULIN GLARGINE 100 UNIT/ML ~~LOC~~ SOLN
18.0000 [IU] | Freq: Every day | SUBCUTANEOUS | Status: DC
  Administered 2017-06-18: 18 [IU] via SUBCUTANEOUS
  Filled 2017-06-18: qty 0.18

## 2017-06-18 MED ORDER — INSULIN GLARGINE 100 UNIT/ML ~~LOC~~ SOLN
5.0000 [IU] | Freq: Once | SUBCUTANEOUS | Status: AC
  Administered 2017-06-18: 5 [IU] via SUBCUTANEOUS
  Filled 2017-06-18: qty 0.05

## 2017-06-18 MED ORDER — HYDRALAZINE HCL 25 MG PO TABS
25.0000 mg | ORAL_TABLET | ORAL | Status: DC | PRN
  Administered 2017-06-19: 25 mg via ORAL
  Filled 2017-06-18: qty 1

## 2017-06-18 MED ORDER — ACETAMINOPHEN 325 MG PO TABS: 650.0000 mg | ORAL_TABLET | Freq: Four times a day (QID) | ORAL | Status: DC | PRN

## 2017-06-18 MED ORDER — INSULIN GLARGINE 100 UNIT/ML ~~LOC~~ SOLN: 16.0000 [IU] | Freq: Every day | SUBCUTANEOUS | Status: DC

## 2017-06-18 MED ORDER — INSULIN GLARGINE 100 UNIT/ML ~~LOC~~ SOLN: 18.0000 [IU] | Freq: Every day | SUBCUTANEOUS | Status: DC

## 2017-06-18 MED ORDER — MAGNESIUM SULFATE 2 GM/50ML IV SOLN
2.0000 g | Freq: Once | INTRAVENOUS | Status: AC
  Administered 2017-06-18: 2 g via INTRAVENOUS
  Filled 2017-06-18: qty 50

## 2017-06-18 MED ORDER — INSULIN GLARGINE 100 UNIT/ML ~~LOC~~ SOLN
23.0000 [IU] | Freq: Every day | SUBCUTANEOUS | Status: DC
  Administered 2017-06-19: 23 [IU] via SUBCUTANEOUS
  Filled 2017-06-18 (×2): qty 0.23

## 2017-06-18 MED ORDER — CITALOPRAM HYDROBROMIDE 20 MG PO TABS: 20.0000 mg | ORAL_TABLET | Freq: Every day | ORAL | Status: DC

## 2017-06-18 NOTE — Progress Notes (Signed)
Order to discontinue cardiac monitoring placed, CCMD and Paramedic notified of change, monitor removed.

## 2017-06-18 NOTE — Progress Notes (Signed)
Patient had several episodes or 4-5 beat runs of v tach so into the night.  Notified the MD.  Awaiting for orders.  Will continue to monitor and notify as needed.

## 2017-06-18 NOTE — Progress Notes (Signed)
Family Medicine Teaching Service Daily Progress Note Intern Pager: [phone]  Patient name: Anthony [name] Medical record number: [id number] Date of birth: 01-30-1951 Age: 67 y.o. Gender: male  Primary Care Provider: Sela Hilding, MD Consultants: Neurology, CCM, PMH Code Status: full  Pt Overview and Major Events to Date:  1/17 Admitted to CCM, intubated 1/18 extubated 1/22 transferred to family medicine   Assessment and Plan: Anthony [name] is a 67 year old male who presented with new-onset seizure due to hyperglycemia >600.  PMH includes atrial fibrillation with RVR on Eliquis, CVA, CAD, CHF, T2DM, CKD Stage III, HTN, Depression, long-QT syndrome.  New-onset seizure: No longer on anti-convulsant since seizure thought to be due to hyperglycemia and electrolyte derangements (hyponatremia of 131, hypochloremia).  However, very small acute stroke was visualized near right lateral ventricle on MRI, so the stroke may have also contributed to patient's seizure.  Another possible contributor to patient's seizure according to neurology includes a chronic hemosiderin stained infarction as the seizure focus.  Was intubated on 1/17 for airway protection during postictal state and given Keppra and Ativan for seizure control.  Extubated on 1/18.  Seizure free since 1/17.   - PT/OT - recommends CIR - if patient develops seizures again, will need long-term anticonvulsant - blood sugar control, detailed below  Atrial fibrillation with RVR: Patient on Eliquis 5 mg BID, Toprol XL. Patient's Toprol was transitioned to 155m daily from BID. Subsequently had tachycardia to 110-120s overnight, changed to Metoprolol tartrate 563mBID with good response. - continue Eliquis - continue Metoprolol tartrate 5019mID  CVA: Patient with an acute punctate CVA in R temporal region as well as several old infarcts and moderate to severe chronic microvascular ischemic changes with moderate parenchymal volume loss.  Patient may have vascular dementia given these findings, however alert and oriented on exam this morning.  - consult with PCP Dr. TimLindell Noeout patient's baseline cognition - continue increased Crestor dose of 20 mg - consider referral to geriatric clinic for neurocognitive testing  CAD: history of stent placed in 2003.   - continue metoprolol as above  CHF: Last echo on 11/21/16 with EF 25-30%, low voltage and left ventricular hypertrophy concerning for possible amyloidosis.  Further imaging with pyrophosphate showed only grade 1 findings.  Was prescribed Losartan outpatient, but this was held inpatient due to AKI.  ICD placement is a possibility for the near future. - continue Metoprolol - restart Losartan once patient's creatinine returns to baseline - Lasix 40 mg PO BID  T2DM: Hemoglobin 06/12/17 was 8.6.  On insulin NPH-regular 70/30 55 units daily at home.  Glucose was in mid-200s to mid-300s overnight with CBG 176 this am after new Lantus dose.  - continue Lantus 16 units daily, titrate as needed. - continue moderate SSI  AKI on CKD Stage III: Baseline appears to ~1.6.  Creatinine at presentation 3.30>1.98 1/23.   - daily BMP - restart Losartan when creatinine gets closer to baseline  HTN: Patient's BP is 131/104 on 1/23.  Currently controlled with IV hydralazine 10-40 mg Q4H PRN for SBP >160, DBP >100. - continue hydralazine for BP control  Hypokalemia: Patient's potassium has been low during hospitalization, likely due to Lasix and concurrent hypomagnesemia.  Potassium 3.4 on 1/23.  Replacement includes Kdur 20 mg BID. - continue Kdur supplementation  Hypomagnesemia: 1.4 1/22 > 2.0 s/p repletion. 1.8 this am 1/23.    - replete with 2 g MgSO4 as needed  Chronic Ankle Pain: Patient endorses chronic R  ankle pain for the last 15-20 years however worsened in the last few days. Xray obtained to r/o fracture given recent seizure. No evidence of fracture. - tylenol PRN - ankle brace  PRN   Depression: Patient taking Paxil 40 mg daily, which is not a recommended SSRI in the elderly.   - switched to fluoxetine 73m daily due to better safety.  Long-QT syndrome: Patient with QT length of 550 on EKG - avoid QT-prolonging medications  FEN/GI: carb-modified diet PPx: not indicated given Eliquis therapy  Disposition: hopeful transfer to CIR  Subjective:  Patient felt well today. Endorsed chronic ankle pain with associated numbness, tingling occasionally.  A&O x3.  Objective: Temp:  [97.6 F (36.4 C)-98.6 F (37 C)] 98.1 F (36.7 C) (01/23 0514) Pulse Rate:  [65-100] 65 (01/23 0514) Resp:  [17-18] 18 (01/23 0514) BP: (114-159)/(85-103) 149/89 (01/23 0514) SpO2:  [96 %-100 %] 97 % (01/23 0514) Weight:  [217 lb 12.8 oz (98.8 kg)] 217 lb 12.8 oz (98.8 kg) (01/23 0514) Physical Exam  Constitutional: He is oriented to person, place, and time. He appears well-developed and well-nourished. No distress.  HENT:  Head: Normocephalic and atraumatic.  Eyes: EOM are normal.  Neck: Normal range of motion. Neck supple.  Cardiovascular: Normal rate.  No murmur heard. Irregular rhythm  Pulmonary/Chest: Effort normal and breath sounds normal. No respiratory distress. He has no wheezes.  Abdominal: Soft. Bowel sounds are normal. There is no tenderness.  Musculoskeletal: Normal range of motion.  Neurological: He is alert and oriented to person, place, and time.  Skin: Skin is warm and dry.  Psychiatric: He has a normal mood and affect. His behavior is normal.   Laboratory: Recent Labs  Lab 06/12/17 2115  06/12/17 2342 06/13/17 0233 06/16/17 0341  WBC 8.5  --   --  6.6 8.4  HGB 14.9   < > 13.3 13.2 14.4  HCT 44.4   < > 39.0 37.9* 40.8  PLT 144*  --   --  121* 110*   < > = values in this interval not displayed.   Recent Labs  Lab 06/12/17 2115  06/16/17 0341 06/17/17 0409 06/18/17 0221  NA 131*   < > 139 135 138  K 4.4   < > 3.3* 3.3* 3.4*  CL 94*   < > 102 101  102  CO2 14*   < > 22 20* 23  BUN 27*   < > 15 16 15  $ CREATININE 3.30*   < > 2.10* 1.98* 1.92*  CALCIUM 8.8*   < > 9.0 8.5* 8.7*  PROT 6.6  --   --   --   --   BILITOT 1.1  --   --   --   --   ALKPHOS 120  --   --   --   --   ALT 34  --   --   --   --   AST 48*  --   --   --   --   GLUCOSE 663*   < > 199* 248* 178*   < > = values in this interval not displayed.    Imaging/Diagnostic Tests:  1/23 Ankle Xray FINDINGS: There is no evidence of fracture, dislocation, or joint effusion. There is no evidence of ankle joint arthropathy. Small plantar calcaneal bone spur noted. Peripheral vascular calcification also seen. IMPRESSION: No acute findings. Small plantar calcaneal bone spur.  Ct Head Wo Contrast  Result Date: 06/12/2017 CLINICAL DATA:  Seizure EXAM:  CT HEAD WITHOUT CONTRAST TECHNIQUE: Contiguous axial images were obtained from the base of the skull through the vertex without intravenous contrast. COMPARISON:  None. FINDINGS: Brain: Moderate atrophy. Moderate to marked small vessel ischemic changes of the white matter. Encephalomalacia in the left frontal lobe consistent with old infarct. Gyriform areas of increased density at the left insula and inferior frontal lobe. Ventricles are nonenlarged. No mass lesion. Vascular: No hyperdense vessels.  Carotid artery calcification. Skull: No fracture. Sinuses/Orbits: Mucosal thickening and retention cysts in the maxillary sinuses, mucosal thickening in the ethmoid sinuses. No acute orbital abnormality. Other: None IMPRESSION: 1. Gyriform areas of increased density along the left insula and inferior left frontal lobe, favored to represent cortical calcifications as opposed to hemorrhage, this could be related to sequela of prior necrosis or infection. Evidence of old left frontal lobe infarct. 2. Atrophy and fairly extensive small vessel ischemic changes of the white matter. 3. Sinus disease. Electronically Signed   By: Donavan Foil M.D.   On:  06/12/2017 22:11   Mr Brain Wo Contrast  Result Date: 06/13/2017 CLINICAL DATA:  67 y/o  M; new seizure. EXAM: MRI HEAD WITHOUT CONTRAST TECHNIQUE: Multiplanar, multiecho pulse sequences of the brain and surrounding structures were obtained without intravenous contrast. COMPARISON:  06/12/2017 CT head. FINDINGS: Brain: Punctate focus of reduced diffusion in right temporal periventricular white matter compatible with acute/early subacute infarction. No acute hemorrhage or mass effect. Chronic hemosiderin stained anterolateral frontal lobe infarction. Extensive patchynonspecific foci of T2 FLAIR hyperintense signal abnormality in subcortical and periventricular white matter are compatible withmoderate to severechronic microvascular ischemic changes for age. Moderatebrain parenchymal volume loss. Small chronic lacunar infarct in left thalamus. No hydrocephalus, effacement of basilar cisterns, or extra-axial collection. Vascular: Normal flow voids. Skull and upper cervical spine: Normal marrow signal. Sinuses/Orbits: Diffuse paranasal sinus mucosal thickening, partial opacification of ethmoid air cells, and left maxillary sinus mucous retention cyst. No abnormal signal of mastoid air cells. Bilateral intra-ocular lens replacement. Other: None. IMPRESSION: 1. Punctate acute/early subacute infarct within right temporal periventricular white matter. No associated hemorrhage or mass effect. 2. Moderate to severe chronic microvascular ischemic changes and moderate parenchymal volume loss of the brain. 3. Chronic hemosiderin stained infarction in left anterolateral frontal lobe. Small chronic lacunar infarct in left thalamus. 4. Moderate paranasal sinus disease. Electronically Signed   By: Kristine Garbe M.D.   On: 06/13/2017 06:02   Dg Chest Port 1 View  Result Date: 06/13/2017 CLINICAL DATA:  Seizure. EXAM: PORTABLE CHEST 1 VIEW COMPARISON:  Radiograph of June 12, 2017. FINDINGS: Stable cardiomegaly.  Endotracheal and nasogastric tubes are unchanged in position. No pneumothorax or pleural effusion is noted. Mild bibasilar subsegmental atelectasis is noted. Bony thorax is unremarkable. Hypoinflation of the lungs is noted. IMPRESSION: Stable support apparatus. Hypoinflation of the lungs is noted with mild bibasilar subsegmental atelectasis. Electronically Signed   By: Marijo Conception, M.D.   On: 06/13/2017 09:47   Dg Chest Portable 1 View  Result Date: 06/12/2017 CLINICAL DATA:  Intubation. EXAM: PORTABLE CHEST 1 VIEW COMPARISON:  August 28, 2005 FINDINGS: The mediastinal contour is normal. The heart size is enlarged. Endotracheal tube is identified distal tip 3.9 cm from carina. There is no pleural line to suggest pneumothorax. There is mild interstitial edema. There is no focal pneumonia or pleural effusion. Nasogastric tube is identified distal tip in the proximal stomach. The bony structures are unremarkable. IMPRESSION: Endotracheal tube in good position. There is no pneumothorax. Nasogastric tube distal tip in  the proximal stomach. Mild interstitial edema. Electronically Signed   By: Abelardo Diesel M.D.   On: 06/12/2017 21:50    Rory Percy, DO 06/18/2017, 7:46 AM PGY-1, [place] Intern pager: [phone], text pages welcome

## 2017-06-18 NOTE — Progress Notes (Signed)
I discussed case with Dr. Delice Lesch, my Rehab MD. He does not feel pt is a candidate for an inpt rehab admit. Recommends SNF. I met with pt at bedside and discussed with he and his wife ( by phone). Pt is in agreement to short term SNF prior to d/c home. I have alerted RN CM who will alert SW. We will sign off at this time. [phone]

## 2017-06-18 NOTE — Progress Notes (Signed)
CSW informed CIR will be unable to admit patient to CIR.  CSW provided list of bed offers to patient and pt wife- they would like [facility]- facility updated they can accept patient and will initiate [facility] auth  CSW will continue to follow  Jorge Ny, [place] Social Worker [phone]

## 2017-06-18 NOTE — Progress Notes (Signed)
Ortho tech called to find out what kind of ankle brace, paged provider.

## 2017-06-18 NOTE — Progress Notes (Signed)
OT Cancellation Note  Patient Details Name: COLTIN CASHER MRN: [id number] DOB: [birth year]   Cancelled Treatment:    Reason Eval/Treat Not Completed: Patient at procedure or test/ unavailable(Diagnostic Radiation). This is the second time this afternoon, PT has been out of the room. OT will continue to follow for treatment prior to DC - note that CIR declined and that dc plan should be changed to SNF.   [place] 06/18/2017, 3:47 PM  Hulda Humphrey OTR/L [phone]

## 2017-06-19 LAB — BASIC METABOLIC PANEL
Anion gap: 11 (ref 5–15)
BUN: 16 mg/dL (ref 6–20)
CO2: 23 mmol/L (ref 22–32)
Calcium: 8.5 mg/dL — ABNORMAL LOW (ref 8.9–10.3)
Chloride: 104 mmol/L (ref 101–111)
Creatinine, Ser: 1.93 mg/dL — ABNORMAL HIGH (ref 0.61–1.24)
GFR calc Af Amer: 40 mL/min — ABNORMAL LOW (ref >60)
GFR calc non Af Amer: 35 mL/min — ABNORMAL LOW (ref >60)
Glucose, Bld: 233 mg/dL — ABNORMAL HIGH (ref 65–99)
Potassium: 3.6 mmol/L (ref 3.5–5.1)
Sodium: 138 mmol/L (ref 135–145)

## 2017-06-19 LAB — GLUCOSE, CAPILLARY
Glucose-Capillary: 238 mg/dL — ABNORMAL HIGH (ref 65–99)
Glucose-Capillary: 223 mg/dL — ABNORMAL HIGH (ref 65–99)
Glucose-Capillary: 250 mg/dL — ABNORMAL HIGH (ref 65–99)
Glucose-Capillary: 307 mg/dL — ABNORMAL HIGH (ref 65–99)

## 2017-06-19 LAB — MAGNESIUM: Magnesium: 1.8 mg/dL (ref 1.7–2.4)

## 2017-06-19 MED ORDER — METOPROLOL TARTRATE 50 MG PO TABS
50.0000 mg | ORAL_TABLET | Freq: Once | ORAL | Status: AC
  Administered 2017-06-19: 50 mg via ORAL
  Filled 2017-06-19: qty 1

## 2017-06-19 MED ORDER — METOPROLOL TARTRATE 100 MG PO TABS
100.0000 mg | ORAL_TABLET | Freq: Two times a day (BID) | ORAL | Status: DC
  Administered 2017-06-19 – 2017-06-20 (×2): 100 mg via ORAL
  Filled 2017-06-19 (×2): qty 1

## 2017-06-19 MED ORDER — LOSARTAN POTASSIUM 50 MG PO TABS
50.0000 mg | ORAL_TABLET | Freq: Every day | ORAL | Status: DC
  Administered 2017-06-19 – 2017-06-20 (×2): 50 mg via ORAL
  Filled 2017-06-19 (×2): qty 1

## 2017-06-19 NOTE — Progress Notes (Signed)
Physical Therapy Treatment Patient Details Name: Anthony [name] MRN: [id number] DOB: 13-Mar-1951 Today's Date: 06/19/2017    History of Present Illness Pt is a 67 y.o. male admitted 06/12/17 with new onset seizure. Intubated 1/17-1/18. MRI brain showed a subcentimeter focus of acute infarction in the white matter adjacent to the right lateral ventricle, likely small vessel disease however small cardioembolic stroke is also possible per MD. PMH significant for chronic atrial fibrillation on Eliquis, CAD, DM, HTN, CHF, OSA on CPAP, GERD, hyperparathyroidism, CKD stage III.   PT Comments    Pt progressing well with mobility. Continues to require intermittent min guard while ambulating with RW, with a slowed gait speed that increases his risk for falls. Wife will not be available to provide 24/7 support at home. Recommend short-term SNF-level therapies to maximize functional mobility and independence prior to return home.   Follow Up Recommendations  SNF     Equipment Recommendations  Rolling walker with 5" wheels;3in1 (PT)    Recommendations for Other Services       Precautions / Restrictions Precautions Precautions: Fall Restrictions Weight Bearing Restrictions: No    Mobility  Bed Mobility               General bed mobility comments: Received sitting in recliner  Transfers Overall transfer level: Needs assistance Equipment used: Rolling walker (2 wheeled) Transfers: Sit to/from Stand Sit to Stand: Min guard            Ambulation/Gait Ambulation/Gait assistance: Min guard Ambulation Distance (Feet): 150 Feet Assistive device: Rolling walker (2 wheeled) Gait Pattern/deviations: Step-through pattern;Decreased stride length;Trunk flexed Gait velocity: Decreased Gait velocity interpretation: <1.8 ft/sec, indicative of risk for recurrent falls General Gait Details: Slow amb with RW; heavy reliance on BUE support for balance. Stability significantly decreased with no UE  support   Stairs            Wheelchair Mobility    Modified Rankin (Stroke Patients Only)       Balance Overall balance assessment: Needs assistance Sitting-balance support: No upper extremity supported;Feet supported Sitting balance-Leahy Scale: Good       Standing balance-Leahy Scale: Fair Standing balance comment: Can static stand with no UE support; reliant on UE support for dynamic balance                            Cognition Arousal/Alertness: Awake/alert Behavior During Therapy: WFL for tasks assessed/performed Overall Cognitive Status: Within Functional Limits for tasks assessed                                 General Comments: Much improved       Exercises      General Comments        Pertinent Vitals/Pain Pain Assessment: No/denies pain    Home Living                      Prior Function            PT Goals (current goals can now be found in the care plan section) Acute Rehab PT Goals Patient Stated Goal: Get stronger at rehab before returning home PT Goal Formulation: With patient Time For Goal Achievement: 06/29/17 Potential to Achieve Goals: Good Progress towards PT goals: Progressing toward goals    Frequency    Min 3X/week      PT Plan Frequency  needs to be updated;Discharge plan needs to be updated    Co-evaluation              AM-PAC PT "6 Clicks" Daily Activity  Outcome Measure  Difficulty turning over in bed (including adjusting bedclothes, sheets and blankets)?: A Little Difficulty moving from lying on back to sitting on the side of the bed? : A Little Difficulty sitting down on and standing up from a chair with arms (e.g., wheelchair, bedside commode, etc,.)?: A Little Help needed moving to and from a bed to chair (including a wheelchair)?: A Little Help needed walking in hospital room?: A Little Help needed climbing 3-5 steps with a railing? : A Little 6 Click Score: 18     End of Session Equipment Utilized During Treatment: Gait belt Activity Tolerance: Patient tolerated treatment well Patient left: in chair;with call bell/phone within reach Nurse Communication: Mobility status PT Visit Diagnosis: Unsteadiness on feet (R26.81);Other abnormalities of gait and mobility (R26.89)     Time: 7494-4967 PT Time Calculation (min) (ACUTE ONLY): 28 min  Charges:  $Gait Training: 8-22 mins $Self Care/Home Management: 8-22                    G Codes:      Mabeline Caras, PT, DPT Acute Rehab Services  Pager: [place] 06/19/2017, 11:56 AM

## 2017-06-19 NOTE — Progress Notes (Signed)
Family Medicine Teaching Service Daily Progress Note Intern Pager: [phone]  Patient name: Anthony [name] Medical record number: [id number] Date of birth: 12-13-1950 Age: 67 y.o. Gender: male  Primary Care Provider: Sela Hilding, MD Consultants: Neurology, CCM, PMH Code Status: full  Pt Overview and Major Events to Date:  1/17 Admitted to CCM, intubated 1/18 extubated 1/22 transferred to family medicine   Assessment and Plan: Anthony [name] is a 67 year old male who presented with new-onset seizure due to hyperglycemia >600.  PMH includes atrial fibrillation with RVR on Eliquis, CVA, CAD, CHF, T2DM, CKD Stage III, HTN, Depression, long-QT syndrome.  New-onset seizure: No longer on anti-convulsant since seizure thought to be due to hyperglycemia and electrolyte derangements (hyponatremia of 131, hypochloremia).  However, very small acute stroke was visualized near right lateral ventricle on MRI, so the stroke may have also contributed to patient's seizure.  Another possible contributor to patient's seizure according to neurology includes a chronic hemosiderin stained infarction as the seizure focus.  Was intubated on 1/17 for airway protection during postictal state and given Keppra and Ativan for seizure control.  Extubated on 1/18.  Seizure free since 1/17.   - PT/OT recommended CIR, but CIR declined patient, so now recommending SNF - if patient develops seizures again, will need long-term anticonvulsant - blood sugar control, detailed below  Atrial fibrillation with RVR: Patient on Eliquis 5 mg BID, Toprol XL. Patient's Toprol was transitioned to 100mg  daily from BID. Subsequently had tachycardia to 110-120s overnight, changed to Metoprolol tartrate 50mg  BID with good response at first, then patient tachycardic to 143 morning of 1/24; also bradycardic to 45 earlier in the evening. - continue Eliquis - increase Lopressor to 100 mg BID given patient's continued tachycardia  CVA:  Patient with an acute punctate CVA in R temporal region as well as several old infarcts and moderate to severe chronic microvascular ischemic changes with moderate parenchymal volume loss. Patient may have vascular dementia given these findings, however alert and oriented on exam this morning. According to PCP, patient able to do ADLs but may have had help with IADLs before hospitalization. - continue increased Crestor dose of 20 mg - consider referral to geriatric clinic for neurocognitive testing  CAD: history of stent placed in 2003.   - continue metoprolol as above  CHF: Last echo on 11/21/16 with EF 25-30%, low voltage and left ventricular hypertrophy concerning for possible amyloidosis.  Further imaging with pyrophosphate showed only grade 1 findings.  Was prescribed Losartan outpatient, but this was held inpatient due to AKI.  ICD placement is a possibility for the near future.  Urine output 2.2 L, weight up 217>224 - continue Metoprolol - restart Losartan at 50 mg daily, half of patient's home dose - Lasix 40 mg PO BID  T2DM: Hemoglobin 06/12/17 was 8.6.  On insulin NPH-regular 70/30 55 units daily at home.  An extra 5 units Lantus was given in addition to prescribed 18 units due to CBGs in mid-300s.  This resulted in CBGs in mid-200s overnight.  Lantus 23 units prescribed for 1/24 evening dose. - continue Lantus 23 units daily, consider increasing dose if sugars remain high during the day on 1/24.  Could also change SSI to resistant. - continue moderate SSI or increase to resistant  AKI on CKD Stage III: Baseline appears to ~1.6.  Creatinine at presentation 3.30>1.93 1/24.   - daily BMP - restart Losartan  HTN: Patient's BP is 131/104 on 1/23.  Currently controlled with IV  hydralazine 10-40 mg Q4H PRN for SBP >160, DBP >100.  Hypertensive over last 24 hours, with last BP 143/115. - continue hydralazine for BP control - restart Losartan  Hypokalemia: Patient's potassium has been low  during hospitalization, likely due to Lasix and concurrent hypomagnesemia.  Potassium 3.6 on 1/24.  Replacement includes Kdur 20 mg BID. - continue Kdur supplementation  Hypomagnesemia: 1.4 1/22 > 2.0 s/p repletion. Magnesium level pending. - replete with 2 g MgSO4 as needed - will need daily magnesium supplementation if patient's Mg is still low on 1/24  Chronic Ankle Pain: Patient endorses chronic R ankle pain for the last 15-20 years however worsened in the last few days. Xray obtained to r/o fracture given recent seizure. No evidence of fracture. - tylenol PRN - ankle brace PRN   Depression: Patient taking Paxil 40 mg daily, which is not a recommended SSRI in the elderly. Switched to fluoxetine on 1/23 due to better safety profile in people aged 65+. - continue fluoxetine 20mg  daily   Long-QT syndrome: Patient with QT length of 550 on EKG - avoid QT-prolonging medications  FEN/GI: carb-modified diet PPx: not indicated given Eliquis therapy  Disposition: SNF either today or tomorrow  Subjective:  Patient felt well today, although he says he is tired today.  He says he was able to take care of his bills and drive before hospitalization.  Objective: Temp:  [98.3 F (36.8 C)-99.9 F (37.7 C)] 98.3 F (36.8 C) (01/24 0533) Pulse Rate:  [45-118] 111 (01/24 0533) Resp:  [17-18] 18 (01/24 0533) BP: (121-143)/(100-115) 143/115 (01/24 0533) SpO2:  [98 %-99 %] 98 % (01/24 0533) Weight:  [224 lb 6.9 oz (101.8 kg)] 224 lb 6.9 oz (101.8 kg) (01/24 0533) Physical Exam  Constitutional: He is oriented to person, place, and time. He appears well-developed and well-nourished. No distress.  HENT:  Head: Normocephalic and atraumatic.  Eyes: EOM are normal.  Neck: Normal range of motion. Neck supple.  Cardiovascular:  No murmur heard. Irregular rhythm; tachycardic  Pulmonary/Chest: Effort normal and breath sounds normal. No respiratory distress. He has no wheezes.  Abdominal: Soft. Bowel  sounds are normal. There is no tenderness.  Musculoskeletal: Normal range of motion.  Neurological: He is alert and oriented to person, place, and time.  Skin: Skin is warm and dry.  Psychiatric: He has a normal mood and affect. His behavior is normal.   Laboratory: Recent Labs  Lab 06/12/17 2115  06/12/17 2342 06/13/17 0233 06/16/17 0341  WBC 8.5  --   --  6.6 8.4  HGB 14.9   < > 13.3 13.2 14.4  HCT 44.4   < > 39.0 37.9* 40.8  PLT 144*  --   --  121* 110*   < > = values in this interval not displayed.   Recent Labs  Lab 06/12/17 2115  06/17/17 0409 06/18/17 0221 06/19/17 0424  NA 131*   < > 135 138 138  K 4.4   < > 3.3* 3.4* 3.6  CL 94*   < > 101 102 104  CO2 14*   < > 20* 23 23  BUN 27*   < > 16 15 16   CREATININE 3.30*   < > 1.98* 1.92* 1.93*  CALCIUM 8.8*   < > 8.5* 8.7* 8.5*  PROT 6.6  --   --   --   --   BILITOT 1.1  --   --   --   --   ALKPHOS 120  --   --   --   --  ALT 34  --   --   --   --   AST 48*  --   --   --   --   GLUCOSE 663*   < > 248* 178* 233*   < > = values in this interval not displayed.    Imaging/Diagnostic Tests:  1/23 Ankle Xray FINDINGS: There is no evidence of fracture, dislocation, or joint effusion. There is no evidence of ankle joint arthropathy. Small plantar calcaneal bone spur noted. Peripheral vascular calcification also seen. IMPRESSION: No acute findings. Small plantar calcaneal bone spur.  Ct Head Wo Contrast  Result Date: 06/12/2017 CLINICAL DATA:  Seizure EXAM: CT HEAD WITHOUT CONTRAST TECHNIQUE: Contiguous axial images were obtained from the base of the skull through the vertex without intravenous contrast. COMPARISON:  None. FINDINGS: Brain: Moderate atrophy. Moderate to marked small vessel ischemic changes of the white matter. Encephalomalacia in the left frontal lobe consistent with old infarct. Gyriform areas of increased density at the left insula and inferior frontal lobe. Ventricles are nonenlarged. No mass lesion.  Vascular: No hyperdense vessels.  Carotid artery calcification. Skull: No fracture. Sinuses/Orbits: Mucosal thickening and retention cysts in the maxillary sinuses, mucosal thickening in the ethmoid sinuses. No acute orbital abnormality. Other: None IMPRESSION: 1. Gyriform areas of increased density along the left insula and inferior left frontal lobe, favored to represent cortical calcifications as opposed to hemorrhage, this could be related to sequela of prior necrosis or infection. Evidence of old left frontal lobe infarct. 2. Atrophy and fairly extensive small vessel ischemic changes of the white matter. 3. Sinus disease. Electronically Signed   By: Donavan Foil M.D.   On: 06/12/2017 22:11   Mr Brain Wo Contrast  Result Date: 06/13/2017 CLINICAL DATA:  67 y/o  M; new seizure. EXAM: MRI HEAD WITHOUT CONTRAST TECHNIQUE: Multiplanar, multiecho pulse sequences of the brain and surrounding structures were obtained without intravenous contrast. COMPARISON:  06/12/2017 CT head. FINDINGS: Brain: Punctate focus of reduced diffusion in right temporal periventricular white matter compatible with acute/early subacute infarction. No acute hemorrhage or mass effect. Chronic hemosiderin stained anterolateral frontal lobe infarction. Extensive patchynonspecific foci of T2 FLAIR hyperintense signal abnormality in subcortical and periventricular white matter are compatible withmoderate to severechronic microvascular ischemic changes for age. Moderatebrain parenchymal volume loss. Small chronic lacunar infarct in left thalamus. No hydrocephalus, effacement of basilar cisterns, or extra-axial collection. Vascular: Normal flow voids. Skull and upper cervical spine: Normal marrow signal. Sinuses/Orbits: Diffuse paranasal sinus mucosal thickening, partial opacification of ethmoid air cells, and left maxillary sinus mucous retention cyst. No abnormal signal of mastoid air cells. Bilateral intra-ocular lens replacement. Other:  None. IMPRESSION: 1. Punctate acute/early subacute infarct within right temporal periventricular white matter. No associated hemorrhage or mass effect. 2. Moderate to severe chronic microvascular ischemic changes and moderate parenchymal volume loss of the brain. 3. Chronic hemosiderin stained infarction in left anterolateral frontal lobe. Small chronic lacunar infarct in left thalamus. 4. Moderate paranasal sinus disease. Electronically Signed   By: Kristine Garbe M.D.   On: 06/13/2017 06:02   Dg Chest Port 1 View  Result Date: 06/13/2017 CLINICAL DATA:  Seizure. EXAM: PORTABLE CHEST 1 VIEW COMPARISON:  Radiograph of June 12, 2017. FINDINGS: Stable cardiomegaly. Endotracheal and nasogastric tubes are unchanged in position. No pneumothorax or pleural effusion is noted. Mild bibasilar subsegmental atelectasis is noted. Bony thorax is unremarkable. Hypoinflation of the lungs is noted. IMPRESSION: Stable support apparatus. Hypoinflation of the lungs is noted with mild bibasilar subsegmental atelectasis.  Electronically Signed   By: Marijo Conception, M.D.   On: 06/13/2017 09:47   Dg Chest Portable 1 View  Result Date: 06/12/2017 CLINICAL DATA:  Intubation. EXAM: PORTABLE CHEST 1 VIEW COMPARISON:  August 28, 2005 FINDINGS: The mediastinal contour is normal. The heart size is enlarged. Endotracheal tube is identified distal tip 3.9 cm from carina. There is no pleural line to suggest pneumothorax. There is mild interstitial edema. There is no focal pneumonia or pleural effusion. Nasogastric tube is identified distal tip in the proximal stomach. The bony structures are unremarkable. IMPRESSION: Endotracheal tube in good position. There is no pneumothorax. Nasogastric tube distal tip in the proximal stomach. Mild interstitial edema. Electronically Signed   By: Abelardo Diesel M.D.   On: 06/12/2017 21:50    Kathrene Alu, MD 06/19/2017, 6:37 AM PGY-1, [place] Intern pager:  [phone], text pages welcome

## 2017-06-19 NOTE — Discharge Summary (Signed)
[facility] Discharge Summary  Patient name: Anthony [name] Medical record number: [id number] Date of birth: 07-Feb-1951 Age: 67 y.o. Gender: male Date of Admission: 06/12/2017  Date of Discharge: 06/20/17 Admitting Physician: Kandice Hams, MD  Primary Care Provider: Sela Hilding, MD Consultants: Neurology, CCM  Indication for Hospitalization: new-onset seizures  Discharge Diagnoses/Problem List:  New-onset seizures Acute stroke in right temporal region Atrial fibrillation with RVR Hx of multiple CVA's CAD CHF T2DM CKD stage III HTN Hypokalemia Hypomagnesemia Chronic ankle pain Depression Long QT syndrome  Disposition: SNF  Discharge Condition: stable, improved  Discharge Exam:  Constitutional: He is oriented to person, place, and time. He  appears well-developed and well-nourished. No distress.  HENT:  Head: Normocephalic and atraumatic.  Eyes: EOM are normal.  Neck: Normal range of motion. Neck supple.  Cardiovascular:  No murmur heard. Irregular rhythm Pulmonary/Chest: Effort normal and breath sounds normal. No respiratory distress. He has no wheezes.  Abdominal: Soft. Bowel sounds are normal. There is no tenderness.  Musculoskeletal: Normal range of motion.  Neurological: He is alert and oriented to person, place, and time.  Skin: Skin is warm and dry.  Psychiatric: He has a normal mood and affect. His behavior is normal.     Brief Hospital Course:  Anthony [name] presented on 1/17 with new-onset seizures and glucose of >600 after not taking his insulin for four days.  He was unresponsive and was unable to protect his airway, so he was intubated.  Found to have an anion gap and was placed on an insulin drip.  Beta hydroxy-butyate was elevated, indicating likely diabetic ketoacidosis.  He was given a load of keppra 2 g then transitioned to 1 g for maintenance until 1/19, when anticonvulsant was discontinued since elevated  glucose thought to be cause of seizure.  MRI significant for several old infarcts and an acute infarct in the right temporal region, as well as generalized atrophy severe for age.  Stroke also thought to be a possible cause of seizure.  Patient was extubated on 1/18 and tolerated this well.  His dose of Crestor was increased from 5 mg to 20 mg for secondary prophylaxis in the setting of patient's acute and chronic CVAs.  Patient's heart rate was elevated during his hospital stay, and he was frequently in atrial fibrillation.  He was first managed with a Cardizem drip but was transitioned to Toprol XL.  His Toprol XL dose was initially 100 BID, and this was changed to 100 daily since this is a once-daily medication.  However, patient began to have worsening tachycardia in the setting of continued atrial fibrillation, so he was switched to Lopressor 50 mg BID.  He continued to be tachycardic to the 140s, so Lopressor dose was increased to 100 mg BID.  His home Eliquis 5 mg BID was continued throughout hospitalization.  Patient's blood sugar was elevated throughout his hospital stay, and he eventually needed 28 units of Lantus with moderate SSI for blood sugar control.  Patient had an AKI at admission with creatinine of 3.30, so losartan was held.  His creatinine continued to trend down toward his baseline and was 1.93 at discharge.  Losartan was restarted at discharge for better blood pressure control.    Patient's depression was being treated with Paxil, which is not recommended for his age, so this was switched to Prozac 20 mg daily.  Patient was recommended for CIR by PT and OT, but CIR declined patient, so he was discharged  to SNF on 06/20/17 after medical therapy was optimized.  Issues for Follow Up:  1. Patient's MRI is concerning for signs consistent with possible cognitive decline.  He would benefit from neurocognitive assessment in the [facility] in the next month. 2. Patient's atrial  fibrillation with RVR was somewhat difficult to control during admission, so this will need close follow up with rate control medication changes as needed. 3. Patient was complaining of bilateral ankle pain during admission, which is a chronic complaint for him.  Ankle x-rays were wnl.  He would benefit from strengthening exercises of his legs and feet at SNF.  He also requests Body Helix pull-on (non-laceup) brace for his ankles. 4. Blood sugar control is an important issue for this patient given its likely contribution to his seizures.  His home regimen is insulin 70/30 55 units daily.  It may be beneficial to transition patient to Lantus or Tresiba in addition to oral agents to aid in better control. 5. Patient's Prozac may need to be increased from 20 mg in about one month.  Significant Procedures: intubation, extubation  Significant Labs and Imaging:  Recent Labs  Lab 06/16/17 0341  WBC 8.4  HGB 14.4  HCT 40.8  PLT 110*   Recent Labs  Lab 06/16/17 0341 06/17/17 0409 06/17/17 0830 06/17/17 2124 06/18/17 0221 06/19/17 0424 06/20/17 0236  NA 139 135  --   --  138 138 137  K 3.3* 3.3*  --   --  3.4* 3.6 4.4  CL 102 101  --   --  102 104 101  CO2 22 20*  --   --  23 23 25   GLUCOSE 199* 248*  --   --  178* 233* 255*  BUN 15 16  --   --  15 16 17   CREATININE 2.10* 1.98*  --   --  1.92* 1.93* 2.17*  CALCIUM 9.0 8.5*  --   --  8.7* 8.5* 8.9  MG 1.4*  --  1.4* 2.0 1.8 1.8  --     Ct Head Wo Contrast  Result Date: 06/12/2017 CLINICAL DATA:  Seizure EXAM: CT HEAD WITHOUT CONTRAST TECHNIQUE: Contiguous axial images were obtained from the base of the skull through the vertex without intravenous contrast. COMPARISON:  None. FINDINGS: Brain: Moderate atrophy. Moderate to marked small vessel ischemic changes of the white matter. Encephalomalacia in the left frontal lobe consistent with old infarct. Gyriform areas of increased density at the left insula and inferior frontal lobe. Ventricles  are nonenlarged. No mass lesion. Vascular: No hyperdense vessels.  Carotid artery calcification. Skull: No fracture. Sinuses/Orbits: Mucosal thickening and retention cysts in the maxillary sinuses, mucosal thickening in the ethmoid sinuses. No acute orbital abnormality. Other: None IMPRESSION: 1. Gyriform areas of increased density along the left insula and inferior left frontal lobe, favored to represent cortical calcifications as opposed to hemorrhage, this could be related to sequela of prior necrosis or infection. Evidence of old left frontal lobe infarct. 2. Atrophy and fairly extensive small vessel ischemic changes of the white matter. 3. Sinus disease. Electronically Signed   By: Donavan Foil M.D.   On: 06/12/2017 22:11   Mr Brain Wo Contrast  Result Date: 06/13/2017 CLINICAL DATA:  67 y/o  M; new seizure. EXAM: MRI HEAD WITHOUT CONTRAST TECHNIQUE: Multiplanar, multiecho pulse sequences of the brain and surrounding structures were obtained without intravenous contrast. COMPARISON:  06/12/2017 CT head. FINDINGS: Brain: Punctate focus of reduced diffusion in right temporal periventricular white matter  compatible with acute/early subacute infarction. No acute hemorrhage or mass effect. Chronic hemosiderin stained anterolateral frontal lobe infarction. Extensive patchynonspecific foci of T2 FLAIR hyperintense signal abnormality in subcortical and periventricular white matter are compatible withmoderate to severechronic microvascular ischemic changes for age. Moderatebrain parenchymal volume loss. Small chronic lacunar infarct in left thalamus. No hydrocephalus, effacement of basilar cisterns, or extra-axial collection. Vascular: Normal flow voids. Skull and upper cervical spine: Normal marrow signal. Sinuses/Orbits: Diffuse paranasal sinus mucosal thickening, partial opacification of ethmoid air cells, and left maxillary sinus mucous retention cyst. No abnormal signal of mastoid air cells. Bilateral  intra-ocular lens replacement. Other: None. IMPRESSION: 1. Punctate acute/early subacute infarct within right temporal periventricular white matter. No associated hemorrhage or mass effect. 2. Moderate to severe chronic microvascular ischemic changes and moderate parenchymal volume loss of the brain. 3. Chronic hemosiderin stained infarction in left anterolateral frontal lobe. Small chronic lacunar infarct in left thalamus. 4. Moderate paranasal sinus disease. Electronically Signed   By: Kristine Garbe M.D.   On: 06/13/2017 06:02   Dg Chest Port 1 View  Result Date: 06/13/2017 CLINICAL DATA:  Seizure. EXAM: PORTABLE CHEST 1 VIEW COMPARISON:  Radiograph of June 12, 2017. FINDINGS: Stable cardiomegaly. Endotracheal and nasogastric tubes are unchanged in position. No pneumothorax or pleural effusion is noted. Mild bibasilar subsegmental atelectasis is noted. Bony thorax is unremarkable. Hypoinflation of the lungs is noted. IMPRESSION: Stable support apparatus. Hypoinflation of the lungs is noted with mild bibasilar subsegmental atelectasis. Electronically Signed   By: Marijo Conception, M.D.   On: 06/13/2017 09:47   Dg Chest Portable 1 View  Result Date: 06/12/2017 CLINICAL DATA:  Intubation. EXAM: PORTABLE CHEST 1 VIEW COMPARISON:  August 28, 2005 FINDINGS: The mediastinal contour is normal. The heart size is enlarged. Endotracheal tube is identified distal tip 3.9 cm from carina. There is no pleural line to suggest pneumothorax. There is mild interstitial edema. There is no focal pneumonia or pleural effusion. Nasogastric tube is identified distal tip in the proximal stomach. The bony structures are unremarkable. IMPRESSION: Endotracheal tube in good position. There is no pneumothorax. Nasogastric tube distal tip in the proximal stomach. Mild interstitial edema. Electronically Signed   By: Abelardo Diesel M.D.   On: 06/12/2017 21:50    Results/Tests Pending at Time of Discharge: none  Discharge  Medications:  Allergies as of 06/20/2017      Reactions   Enalapril Maleate Cough      Medication List    STOP taking these medications   insulin NPH-regular Human (70-30) 100 UNIT/ML injection Commonly known as:  NOVOLIN 70/30   metoprolol succinate 50 MG 24 hr tablet Commonly known as:  TOPROL-XL   PARoxetine 40 MG tablet Commonly known as:  PAXIL     TAKE these medications   apixaban 5 MG Tabs tablet Commonly known as:  ELIQUIS Take 1 tablet by mouth twice a day What changed:    how much to take  how to take this  when to take this  additional instructions   calcitRIOL 0.25 MCG capsule Commonly known as:  ROCALTROL TAKE 0.25 MG BY MOUTH EVERY MON WED AND FRIDAY   FLUoxetine 20 MG capsule Commonly known as:  PROZAC Take 1 capsule (20 mg total) by mouth daily.   furosemide 40 MG tablet Commonly known as:  LASIX Take 1 tablet (40 mg total) by mouth 2 (two) times daily.   gabapentin 300 MG capsule Commonly known as:  NEURONTIN TAKE ONE CAPSULE BY MOUTH AT  BEDTIME What changed:    how much to take  how to take this  when to take this   hydrALAZINE 25 MG tablet Commonly known as:  APRESOLINE Take 1 tablet (25 mg total) by mouth every 4 (four) hours as needed (Give SBP >160 or DBP >110).   insulin aspart 100 UNIT/ML injection Commonly known as:  novoLOG Inject 0-15 Units into the skin 3 (three) times daily with meals.   insulin glargine 100 UNIT/ML injection Commonly known as:  LANTUS Inject 0.28 mLs (28 Units total) into the skin at bedtime.   losartan 100 MG tablet Commonly known as:  COZAAR Take 0.5 tablets (50 mg total) by mouth daily. What changed:  how much to take   magnesium oxide 400 (241.3 Mg) MG tablet Commonly known as:  MAG-OX Take 0.5 tablets (200 mg total) by mouth daily.   metoprolol tartrate 100 MG tablet Commonly known as:  LOPRESSOR Take 1 tablet (100 mg total) by mouth 2 (two) times daily.   multivitamin tablet Take 1  tablet by mouth daily.   nitroGLYCERIN 0.4 MG SL tablet Commonly known as:  NITROSTAT Place 0.4 mg under the tongue every 5 (five) minutes as needed for chest pain. If no relief by 3rd tab, call 911   Omega 3 1000 MG Caps Take 1,000 mg by mouth 3 (three) times daily.   omeprazole 40 MG capsule Commonly known as:  PRILOSEC Take 1 capsule (40 mg total) by mouth daily.   oxymorphone 5 MG tablet Commonly known as:  OPANA Take 5 mg by mouth every 6 (six) hours as needed for pain (max 3 tablets daily). Per Dr Brien Few   polyethylene glycol packet Commonly known as:  MIRALAX / GLYCOLAX Take 17 g by mouth daily.   potassium chloride SA 20 MEQ tablet Commonly known as:  K-DUR,KLOR-CON Take 1 tablet (20 mEq total) by mouth 2 (two) times daily.   rosuvastatin 20 MG tablet Commonly known as:  CRESTOR Take 1 tablet (20 mg total) by mouth daily. What changed:    medication strength  how much to take            Durable Medical Equipment  (From admission, onward)        Start     Ordered   06/18/17 1354  For home use only DME 4 wheeled rolling walker with seat  Once    Comments:  With 5" wheels  Question:  Patient needs a walker to treat with the following condition  Answer:  Limited mobility   06/18/17 1354      Discharge Instructions: Please refer to Patient Instructions section of EMR for full details.  Patient was counseled important signs and symptoms that should prompt return to medical care, changes in medications, dietary instructions, activity restrictions, and follow up appointments.   Follow-Up Appointments: Contact information for after-discharge care    [facility] .   Service:  Skilled Nursing Contact information: [address] [place] [phone]              Kathrene Alu, MD 06/20/2017, 10:53 AM PGY-1, [place]

## 2017-06-19 NOTE — Progress Notes (Signed)
Inpatient Diabetes Program Recommendations  AACE/ADA: New Consensus Statement on Inpatient Glycemic Control (2015)  Target Ranges:  Prepandial:   less than 140 mg/dL      Peak postprandial:   less than 180 mg/dL (1-2 hours)      Critically ill patients:  140 - 180 mg/dL   Results for Anthony [name], Anthony [name] (MRN [id number]) as of 06/19/2017 09:42  Ref. Range 06/18/2017 07:42 06/18/2017 11:57 06/18/2017 13:03 06/18/2017 16:05 06/18/2017 20:59 06/18/2017 21:03 06/18/2017 23:55 06/19/2017 07:57  Glucose-Capillary Latest Ref Range: 65 - 99 mg/dL 183 (H) 239 (H) 310 (H) 352 (H) 331 (H) 307 (H) 278 (H) 238 (H)   Review of Glycemic Control  Diabetes history: DM 2 Outpatient Diabetes medications: 70/30 55 units Daily at noon,  Current orders for Inpatient glycemic control: Lantus 23 units, Novolog Moderate Correction 0-15 units tid   Inpatient Diabetes Program Recommendations:   Noted 23 units of Lantus given yesterday and glucose is still elevated in the 230 range. Please consider increasing Lantus to 28 units. post prandial glucose continues to be consistently greater than 180 mg/dl, please consider ordering Novolog 3 units TID with meals for meal coverage. Give if patient consumes at least 50% of meals and glucose is at least 80 mg/dl.  Thanks,  Tama Headings RN, MSN, [facility] Inpatient Diabetes Coordinator Team Pager [phone] (8a-5p)

## 2017-06-19 NOTE — Progress Notes (Signed)
Orthopedic Tech Progress Note Patient Details:  RODGER GIANGREGORIO 1950-11-16 [id number] Patient did not want ace wrap. Patient ID: FADY STAMPS, male   DOB: 03-15-1951, 67 y.o.   MRN: [id number]   Braulio Bosch 06/19/2017, 3:32 PM

## 2017-06-20 LAB — BASIC METABOLIC PANEL
Anion gap: 11 (ref 5–15)
BUN: 17 mg/dL (ref 6–20)
CO2: 25 mmol/L (ref 22–32)
Calcium: 8.9 mg/dL (ref 8.9–10.3)
Chloride: 101 mmol/L (ref 101–111)
Creatinine, Ser: 2.17 mg/dL — ABNORMAL HIGH (ref 0.61–1.24)
GFR calc Af Amer: 35 mL/min — ABNORMAL LOW (ref >60)
GFR calc non Af Amer: 30 mL/min — ABNORMAL LOW (ref >60)
Glucose, Bld: 255 mg/dL — ABNORMAL HIGH (ref 65–99)
Potassium: 4.4 mmol/L (ref 3.5–5.1)
Sodium: 137 mmol/L (ref 135–145)

## 2017-06-20 LAB — GLUCOSE, CAPILLARY
Glucose-Capillary: 194 mg/dL — ABNORMAL HIGH (ref 65–99)
Glucose-Capillary: 235 mg/dL — ABNORMAL HIGH (ref 65–99)

## 2017-06-20 MED ORDER — METOPROLOL TARTRATE 100 MG PO TABS: 100.0000 mg | ORAL_TABLET | Freq: Two times a day (BID) | ORAL | 0 refills | Status: DC

## 2017-06-20 MED ORDER — POLYETHYLENE GLYCOL 3350 17 G PO PACK: 17.0000 g | PACK | Freq: Every day | ORAL | 0 refills | Status: DC

## 2017-06-20 MED ORDER — MAGNESIUM OXIDE 400 (241.3 MG) MG PO TABS
200.0000 mg | ORAL_TABLET | Freq: Every day | ORAL | Status: DC
  Administered 2017-06-20: 200 mg via ORAL
  Filled 2017-06-20: qty 1

## 2017-06-20 MED ORDER — MAGNESIUM OXIDE 400 (241.3 MG) MG PO TABS: 200.0000 mg | ORAL_TABLET | Freq: Every day | ORAL | 0 refills | Status: DC

## 2017-06-20 MED ORDER — HYDRALAZINE HCL 25 MG PO TABS: 25.0000 mg | ORAL_TABLET | ORAL | 0 refills | Status: DC | PRN

## 2017-06-20 MED ORDER — INSULIN ASPART 100 UNIT/ML ~~LOC~~ SOLN: 0.0000 [IU] | Freq: Three times a day (TID) | SUBCUTANEOUS | 11 refills | Status: DC

## 2017-06-20 MED ORDER — LOSARTAN POTASSIUM 100 MG PO TABS: 50.0000 mg | ORAL_TABLET | Freq: Every day | ORAL | 0 refills | Status: DC

## 2017-06-20 MED ORDER — FLUOXETINE HCL 20 MG PO CAPS: 20.0000 mg | ORAL_CAPSULE | Freq: Every day | ORAL | 0 refills | Status: DC

## 2017-06-20 MED ORDER — ROSUVASTATIN CALCIUM 20 MG PO TABS: 20.0000 mg | ORAL_TABLET | Freq: Every day | ORAL | 0 refills | Status: DC

## 2017-06-20 MED ORDER — INSULIN GLARGINE 100 UNIT/ML ~~LOC~~ SOLN: 28.0000 [IU] | Freq: Every day | SUBCUTANEOUS | 11 refills | Status: DC

## 2017-06-20 NOTE — Discharge Instructions (Signed)
Mr. Sheppard, thank you for allowing [place] to participate in your care.  You were treated for seizures due to hyperglycemia and small stroke as well as atrial fibrillation with RPR.  It will be very important for you to take your insulin as prescribed to keep your sugar under control.  We are also giving you a medication called Lopressor twice per day to help control your heart rate.  Please see Dr. Lindell Noe at the [facility] once you go home from your rehab stay.

## 2017-06-20 NOTE — Progress Notes (Signed)
Occupational Therapy Treatment Patient Details Name: Anthony [name] MRN: [id number] DOB: 25-Aug-1950 Today's Date: 06/20/2017    History of present illness Pt is a 67 y.o. male admitted 06/12/17 with new onset seizure. Intubated 1/17-1/18. MRI brain showed a subcentimeter focus of acute infarction in the white matter adjacent to the right lateral ventricle, likely small vessel disease however small cardioembolic stroke is also possible per MD. PMH significant for chronic atrial fibrillation on Eliquis, CAD, DM, HTN, CHF, OSA on CPAP, GERD, hyperparathyroidism, CKD stage III.   OT comments  Pt making progress with functional goals. OT will continue with acute OT services to maximize level of function and safety  Follow Up Recommendations  SNF(CIR did not approve)    Equipment Recommendations  Other (comment)(TBD at next venue of care)    Recommendations for Other Services      Precautions / Restrictions Precautions Precautions: Fall Restrictions Weight Bearing Restrictions: No       Mobility Bed Mobility Overal bed mobility: Needs Assistance Bed Mobility: Sit to Supine       Sit to supine: Supervision   General bed mobility comments: pt sitting EOB upon arrival  Transfers Overall transfer level: Needs assistance Equipment used: Rolling walker (2 wheeled) Transfers: Sit to/from Stand Sit to Stand: Min guard              Balance Overall balance assessment: Needs assistance Sitting-balance support: No upper extremity supported;Feet supported Sitting balance-Leahy Scale: Good     Standing balance support: During functional activity;Bilateral upper extremity supported;Single extremity supported Standing balance-Leahy Scale: Fair                             ADL either performed or assessed with clinical judgement   ADL Overall ADL's : Needs assistance/impaired                                             Vision Baseline  Vision/History: Wears glasses Wears Glasses: Reading only Patient Visual Report: No change from baseline     Perception     Praxis      Cognition Arousal/Alertness: Awake/alert Behavior During Therapy: WFL for tasks assessed/performed Overall Cognitive Status: Within Functional Limits for tasks assessed                                          Exercises     Shoulder Instructions       General Comments      Pertinent Vitals/ Pain       Pain Assessment: 0-10 Pain Score: 5  Pain Location: R foot/ankle Pain Descriptors / Indicators: Sore;Tingling Pain Intervention(s): Monitored during session;Repositioned  Home Living                                          Prior Functioning/Environment              Frequency  Min 2X/week        Progress Toward Goals  OT Goals(current goals can now be found in the care plan section)  Progress towards OT goals: Progressing toward goals  Acute Rehab OT Goals Patient  Stated Goal: Get stronger at rehab before returning home  Plan Discharge plan remains appropriate    Co-evaluation                 AM-PAC PT "6 Clicks" Daily Activity     Outcome Measure   Help from another person eating meals?: None Help from another person taking care of personal grooming?: A Little Help from another person toileting, which includes using toliet, bedpan, or urinal?: A Little Help from another person bathing (including washing, rinsing, drying)?: A Lot Help from another person to put on and taking off regular upper body clothing?: A Little Help from another person to put on and taking off regular lower body clothing?: A Lot 6 Click Score: 17    End of Session Equipment Utilized During Treatment: Gait belt;Rolling walker  OT Visit Diagnosis: Unsteadiness on feet (R26.81);Other abnormalities of gait and mobility (R26.89);Muscle weakness (generalized) (M62.81);Other symptoms and signs involving  cognitive function;Pain Pain - Right/Left: Right Pain - part of body: Ankle and joints of foot   Activity Tolerance Patient tolerated treatment well   Patient Left with call bell/phone within reach;in bed   Nurse Communication      Functional Assessment Tool Used: AM-PAC 6 Clicks Daily Activity   Time: 2550-0164 OT Time Calculation (min): 25 min  Charges: OT G-codes **NOT FOR INPATIENT CLASS** Functional Assessment Tool Used: AM-PAC 6 Clicks Daily Activity OT General Charges $OT Visit: 1 Visit OT Treatments $Self Care/Home Management : 8-22 mins $Therapeutic Activity: 8-22 mins     Britt Bottom 06/20/2017, 2:01 PM

## 2017-06-20 NOTE — Progress Notes (Signed)
Report given to nurse Lanelle Bal at [facility]. All belongings gathered & sent with pt. avs printed & placed in chart. Call back number given to nurse in case any questions came about later. Anthony [name]

## 2017-06-20 NOTE — Clinical Social Work Note (Signed)
Clinical Social Worker facilitated patient discharge including contacting patient and facility to confirm patient discharge plans.  Clinical information faxed to facility and patient agreeable with plan.  Patient plans to notify family of plans for discharge.  CSW arranged ambulance transport via [facility] to [facility] [phone] Room 128A) .  RN to call report prior to discharge.  Clinical Social Worker will sign off for now as social work intervention is no longer needed. Please consult [place] again if new need arises.  Barbette Or, [place]

## 2017-06-20 NOTE — Progress Notes (Signed)
Family Medicine Teaching Service Daily Progress Note Intern Pager: [phone]  Patient name: Anthony [name] Medical record number: [id number] Date of birth: 10-Feb-1951 Age: 67 y.o. Gender: male  Primary Care Provider: Sela Hilding, MD Consultants: Neurology, CCM, PMH Code Status: full  Pt Overview and Major Events to Date:  1/17 Admitted to CCM, intubated 1/18 extubated 1/22 transferred to family medicine   Assessment and Plan: Anthony [name] is a 67 year old male who presented with new-onset seizure due to hyperglycemia >600.  PMH includes atrial fibrillation with RVR on Eliquis, CVA, CAD, CHF, T2DM, CKD Stage III, HTN, Depression, long-QT syndrome.  New-onset seizure: No longer on anti-convulsant since seizure thought to be due to hyperglycemia and electrolyte derangements (hyponatremia of 131, hypochloremia).  However, very small acute stroke was visualized near right lateral ventricle on MRI, so the stroke may have also contributed to patient's seizure.  Another possible contributor to patient's seizure according to neurology includes a chronic hemosiderin stained infarction as the seizure focus.  Was intubated on 1/17 for airway protection during postictal state and given Keppra and Ativan for seizure control.  Extubated on 1/18.  Seizure free since 1/17.   - PT/OT recommended CIR, but CIR declined patient, so now recommending SNF - if patient develops seizures again, will need long-term anticonvulsant - blood sugar control, detailed below  Atrial fibrillation with RVR: Patient on Eliquis 5 mg BID, Toprol XL. Patient's Toprol was transitioned to '100mg'$  daily from BID. Subsequently had tachycardia to 110-120s overnight, changed to Metoprolol tartrate '50mg'$  BID with good response at first, then patient tachycardic to 143 morning of 1/24; also bradycardic to 45 earlier in the evening. - continue Eliquis - increase Lopressor to 100 mg BID given patient's continued tachycardia  CVA:  Patient with an acute punctate CVA in R temporal region as well as several old infarcts and moderate to severe chronic microvascular ischemic changes with moderate parenchymal volume loss. Patient may have vascular dementia given these findings, however alert and oriented on exam this morning. According to PCP, patient able to do ADLs but may have had help with IADLs before hospitalization. - continue increased Crestor dose of 20 mg - consider referral to geriatric clinic for neurocognitive testing  CAD: history of stent placed in 2003.   - continue metoprolol as above  CHF: Last echo on 11/21/16 with EF 25-30%, low voltage and left ventricular hypertrophy concerning for possible amyloidosis.  Further imaging with pyrophosphate showed only grade 1 findings.  Was prescribed Losartan outpatient, but this was held inpatient due to AKI.  ICD placement is a possibility for the near future.  - continue Metoprolol - continue Losartan at 50 mg daily, half of patient's home dose - Lasix 40 mg PO BID  T2DM: Hemoglobin 06/12/17 was 8.6.  On insulin NPH-regular 70/30 55 units daily at home.  An extra 5 units Lantus was given in addition to prescribed 18 units due to CBGs in mid-300s.  This resulted in CBGs in mid 200s to low 300s overnight.  Received Lantus 23 units for 1/24 evening dose. - increase Lantus to 28 units daily at discharge  AKI on CKD Stage III: Baseline appears to ~1.6.  Creatinine at presentation 3.30>2.17 1/25.   - daily BMP - continue Losartan  HTN: Patient's BP is 113/95 on 1/25.  Currently controlled with PO hydralazine 25 mg Q4H PRN for SBP >160, DBP >110 and half dose of home losartan. - continue hydralazine for BP control - continue Losartan 50 mg  daily.  Hypokalemia: Patient's potassium has been low during hospitalization, likely due to Lasix and concurrent hypomagnesemia.  Potassium 4.4 on 1/25.  Replacement includes Kdur 20 mg BID.  Hypomagnesemia: 1.4 1/22 > 2.0 s/p repletion.  1.8 on 1/24 - replete with 2 g MgSO4 as needed - will need daily magnesium supplementation if patient's Mg is still low on 1/24  Chronic Ankle Pain: Patient endorses chronic R ankle pain for the last 15-20 years however worsened in the last few days. Xray obtained to r/o fracture given recent seizure. No evidence of fracture.  Patient declined brace but would like a different type, detailed in subjective below. - tylenol PRN  Depression: Patient taking Paxil 40 mg daily, which is not a recommended SSRI in the elderly. Switched to fluoxetine on 1/23 due to better safety profile in people aged 65+. - continue fluoxetine '20mg'$  daily   Long-QT syndrome: Patient with QT length of 550 on EKG - avoid QT-prolonging medications  FEN/GI: carb-modified diet PPx: not indicated given Eliquis therapy  Disposition: SNF today  Subjective:  Patient says that the ace brace was not the type he needed, but he has used pull-on sock-type braces before that he says have worked well in the past.  Otherwise he has no complaints.  I talked with him about the importance of using his insulin for better glucose control since his high blood sugar likely contributed to his seizures, and he very much agreed and take his insulin as prescribed.  He is looking forward to leaving the hospital today.  Objective: Temp:  [98.1 F (36.7 C)-98.6 F (37 C)] 98.2 F (36.8 C) (01/25 0523) Pulse Rate:  [75-143] 75 (01/25 0523) Resp:  [16-18] 17 (01/25 0523) BP: (113-134)/(95-102) 113/95 (01/25 0523) SpO2:  [98 %-100 %] 100 % (01/25 0523) Physical Exam  Constitutional: He is oriented to person, place, and time. He appears well-developed and well-nourished. No distress.  HENT:  Head: Normocephalic and atraumatic.  Eyes: EOM are normal.  Neck: Normal range of motion. Neck supple.  Cardiovascular:  No murmur heard. Irregular rhythm  Pulmonary/Chest: Effort normal and breath sounds normal. No respiratory distress. He has no  wheezes.  Abdominal: Soft. Bowel sounds are normal. There is no tenderness.  Musculoskeletal: Normal range of motion.  Neurological: He is alert and oriented to person, place, and time.  Skin: Skin is warm and dry.  Psychiatric: He has a normal mood and affect. His behavior is normal.   Laboratory: Recent Labs  Lab 06/16/17 0341  WBC 8.4  HGB 14.4  HCT 40.8  PLT 110*   Recent Labs  Lab 06/18/17 0221 06/19/17 0424 06/20/17 0236  NA 138 138 137  K 3.4* 3.6 4.4  CL 102 104 101  CO2 '23 23 25  '$ BUN '15 16 17  '$ CREATININE 1.92* 1.93* 2.17*  CALCIUM 8.7* 8.5* 8.9  GLUCOSE 178* 233* 255*    Imaging/Diagnostic Tests:  1/23 Ankle Xray FINDINGS: There is no evidence of fracture, dislocation, or joint effusion. There is no evidence of ankle joint arthropathy. Small plantar calcaneal bone spur noted. Peripheral vascular calcification also seen. IMPRESSION: No acute findings. Small plantar calcaneal bone spur.  Ct Head Wo Contrast  Result Date: 06/12/2017 CLINICAL DATA:  Seizure EXAM: CT HEAD WITHOUT CONTRAST TECHNIQUE: Contiguous axial images were obtained from the base of the skull through the vertex without intravenous contrast. COMPARISON:  None. FINDINGS: Brain: Moderate atrophy. Moderate to marked small vessel ischemic changes of the white matter. Encephalomalacia in the left  frontal lobe consistent with old infarct. Gyriform areas of increased density at the left insula and inferior frontal lobe. Ventricles are nonenlarged. No mass lesion. Vascular: No hyperdense vessels.  Carotid artery calcification. Skull: No fracture. Sinuses/Orbits: Mucosal thickening and retention cysts in the maxillary sinuses, mucosal thickening in the ethmoid sinuses. No acute orbital abnormality. Other: None IMPRESSION: 1. Gyriform areas of increased density along the left insula and inferior left frontal lobe, favored to represent cortical calcifications as opposed to hemorrhage, this could be related to  sequela of prior necrosis or infection. Evidence of old left frontal lobe infarct. 2. Atrophy and fairly extensive small vessel ischemic changes of the white matter. 3. Sinus disease. Electronically Signed   By: Donavan Foil M.D.   On: 06/12/2017 22:11   Mr Brain Wo Contrast  Result Date: 06/13/2017 CLINICAL DATA:  67 y/o  M; new seizure. EXAM: MRI HEAD WITHOUT CONTRAST TECHNIQUE: Multiplanar, multiecho pulse sequences of the brain and surrounding structures were obtained without intravenous contrast. COMPARISON:  06/12/2017 CT head. FINDINGS: Brain: Punctate focus of reduced diffusion in right temporal periventricular white matter compatible with acute/early subacute infarction. No acute hemorrhage or mass effect. Chronic hemosiderin stained anterolateral frontal lobe infarction. Extensive patchynonspecific foci of T2 FLAIR hyperintense signal abnormality in subcortical and periventricular white matter are compatible withmoderate to severechronic microvascular ischemic changes for age. Moderatebrain parenchymal volume loss. Small chronic lacunar infarct in left thalamus. No hydrocephalus, effacement of basilar cisterns, or extra-axial collection. Vascular: Normal flow voids. Skull and upper cervical spine: Normal marrow signal. Sinuses/Orbits: Diffuse paranasal sinus mucosal thickening, partial opacification of ethmoid air cells, and left maxillary sinus mucous retention cyst. No abnormal signal of mastoid air cells. Bilateral intra-ocular lens replacement. Other: None. IMPRESSION: 1. Punctate acute/early subacute infarct within right temporal periventricular white matter. No associated hemorrhage or mass effect. 2. Moderate to severe chronic microvascular ischemic changes and moderate parenchymal volume loss of the brain. 3. Chronic hemosiderin stained infarction in left anterolateral frontal lobe. Small chronic lacunar infarct in left thalamus. 4. Moderate paranasal sinus disease. Electronically Signed   By:  Kristine Garbe M.D.   On: 06/13/2017 06:02   Dg Chest Port 1 View  Result Date: 06/13/2017 CLINICAL DATA:  Seizure. EXAM: PORTABLE CHEST 1 VIEW COMPARISON:  Radiograph of June 12, 2017. FINDINGS: Stable cardiomegaly. Endotracheal and nasogastric tubes are unchanged in position. No pneumothorax or pleural effusion is noted. Mild bibasilar subsegmental atelectasis is noted. Bony thorax is unremarkable. Hypoinflation of the lungs is noted. IMPRESSION: Stable support apparatus. Hypoinflation of the lungs is noted with mild bibasilar subsegmental atelectasis. Electronically Signed   By: Marijo Conception, M.D.   On: 06/13/2017 09:47   Dg Chest Portable 1 View  Result Date: 06/12/2017 CLINICAL DATA:  Intubation. EXAM: PORTABLE CHEST 1 VIEW COMPARISON:  August 28, 2005 FINDINGS: The mediastinal contour is normal. The heart size is enlarged. Endotracheal tube is identified distal tip 3.9 cm from carina. There is no pleural line to suggest pneumothorax. There is mild interstitial edema. There is no focal pneumonia or pleural effusion. Nasogastric tube is identified distal tip in the proximal stomach. The bony structures are unremarkable. IMPRESSION: Endotracheal tube in good position. There is no pneumothorax. Nasogastric tube distal tip in the proximal stomach. Mild interstitial edema. Electronically Signed   By: Abelardo Diesel M.D.   On: 06/12/2017 21:50    Kathrene Alu, MD 06/20/2017, 6:53 AM PGY-1, [place] Intern pager: [phone], text pages welcome

## 2017-06-20 NOTE — Clinical Social Work Placement (Signed)
   CLINICAL SOCIAL WORK PLACEMENT  NOTE  Date:  06/20/2017  Patient Details  Name: ANICETO DENNING MRN: [id number] Date of Birth: [birth year]  Clinical Social Work is seeking post-discharge placement for this patient at the [place] level of care (*CSW will initial, date and re-position this form in  chart as items are completed):  Yes   Patient/family provided with [place] Work Department's list of facilities offering this level of care within the geographic area requested by the patient (or if unable, by the patient's family).  Yes   Patient/family informed of their freedom to choose among providers that offer the needed level of care, that participate in Medicare, Medicaid or managed care program needed by the patient, have an available bed and are willing to accept the patient.  Yes   Patient/family informed of [facility]'s ownership interest in [facility] and [facility], as well as of the fact that they are under no obligation to receive care at these facilities.  PASRR submitted to EDS on 06/17/17     PASRR number received on 06/17/17     Existing PASRR number confirmed on       FL2 transmitted to all facilities in geographic area requested by pt/family on 06/17/17     FL2 transmitted to all facilities within larger geographic area on       Patient informed that his/her managed care company has contracts with or will negotiate with certain facilities, including the following:        Yes   Patient/family informed of bed offers received.  Patient chooses bed at [facility]     Physician recommends and patient chooses bed at      Patient to be transferred to [facility] on 06/20/17.  Patient to be transferred to facility by Ambulance     Patient family notified on 06/20/17 of transfer.  Name of family member notified:  Patient to notify family     PHYSICIAN Please sign FL2, Please prepare priority discharge  summary, including medications     Additional Comment:    Barbette Or, [place]

## 2017-06-20 NOTE — Care Management Important Message (Signed)
Important Message  Patient Details  Name: ALBERTUS CHIARELLI MRN: [id number] Date of Birth: 1951-01-24   Medicare Important Message Given:  Yes    Barb Merino Ginny Loomer 06/20/2017, 2:38 PM

## 2017-06-24 ENCOUNTER — Telehealth: Payer: Self-pay

## 2017-06-24 NOTE — Telephone Encounter (Signed)
Left message with pt's mobile number. His start time for his ICD placement on 2/15 has changed. He will need to be at the hospital at 0830 for a 1030 start time.

## 2017-06-24 NOTE — Telephone Encounter (Signed)
Follow UP:    Pt called and I gave him the information on the new time of his replacement on 07-11-17 at 8:30 for 10:30 start time.

## 2017-07-01 ENCOUNTER — Other Ambulatory Visit: Payer: Self-pay | Admitting: Family Medicine

## 2017-07-02 ENCOUNTER — Other Ambulatory Visit: Payer: Medicare Other | Admitting: *Deleted

## 2017-07-02 DIAGNOSIS — I5041 Acute combined systolic (congestive) and diastolic (congestive) heart failure: Secondary | ICD-10-CM

## 2017-07-02 DIAGNOSIS — Z01812 Encounter for preprocedural laboratory examination: Secondary | ICD-10-CM

## 2017-07-02 LAB — BASIC METABOLIC PANEL
BUN/Creatinine Ratio: 8 — ABNORMAL LOW (ref 10–24)
BUN: 20 mg/dL (ref 8–27)
CO2: 24 mmol/L (ref 20–29)
Calcium: 8.8 mg/dL (ref 8.6–10.2)
Chloride: 103 mmol/L (ref 96–106)
Creatinine, Ser: 2.42 mg/dL — ABNORMAL HIGH (ref 0.76–1.27)
GFR calc Af Amer: 31 mL/min/{1.73_m2} — ABNORMAL LOW (ref >59)
GFR calc non Af Amer: 27 mL/min/{1.73_m2} — ABNORMAL LOW (ref >59)
Glucose: 127 mg/dL — ABNORMAL HIGH (ref 65–99)
Potassium: 4.5 mmol/L (ref 3.5–5.2)
Sodium: 141 mmol/L (ref 134–144)

## 2017-07-02 LAB — CBC WITH DIFFERENTIAL/PLATELET
Basophils Absolute: 0 10*3/uL (ref 0.0–0.2)
Basos: 0 %
EOS (ABSOLUTE): 0.1 10*3/uL (ref 0.0–0.4)
Eos: 2 %
Hematocrit: 39.2 % (ref 37.5–51.0)
Hemoglobin: 13.3 g/dL (ref 13.0–17.7)
Immature Grans (Abs): 0 10*3/uL (ref 0.0–0.1)
Immature Granulocytes: 0 %
Lymphocytes Absolute: 1.7 10*3/uL (ref 0.7–3.1)
Lymphs: 25 %
MCH: 29.4 pg (ref 26.6–33.0)
MCHC: 33.9 g/dL (ref 31.5–35.7)
MCV: 87 fL (ref 79–97)
Monocytes Absolute: 0.6 10*3/uL (ref 0.1–0.9)
Monocytes: 9 %
Neutrophils Absolute: 4.4 10*3/uL (ref 1.4–7.0)
Neutrophils: 64 %
Platelets: 172 10*3/uL (ref 150–379)
RBC: 4.52 x10E6/uL (ref 4.14–5.80)
RDW: 15.4 % (ref 12.3–15.4)
WBC: 6.9 10*3/uL (ref 3.4–10.8)

## 2017-07-04 ENCOUNTER — Encounter: Payer: Self-pay | Admitting: Internal Medicine

## 2017-07-07 ENCOUNTER — Telehealth: Payer: Self-pay

## 2017-07-07 MED ORDER — LOSARTAN POTASSIUM 25 MG PO TABS: 25.0000 mg | ORAL_TABLET | Freq: Every day | ORAL | 3 refills | Status: DC

## 2017-07-07 NOTE — Telephone Encounter (Signed)
Spoke with pt regarding decreasing his Cozaar to 25mg  everyday and to repeat his BMP in a week or so. I asked him to f/u with his PCP or Renal Dr regarding kidney function per Dr Olin Pia recommendation. I also confirmed with him, his upcoming procedure on 2/15 with Dr Caryl Comes (ICD placement). Pt stated he understood and had no additional questions.

## 2017-07-07 NOTE — Telephone Encounter (Signed)
-----   Message from Deboraha Sprang, MD sent at 07/05/2017 10:48 AM EST ----- Please Inform Patient that labs show signicant worsening of renal function  He was just in hosptial where is Cr peaked @ 3 losartan held and then resumed.   Cr improved and is now worseing Will need to see PCP/Renal to figure out what to do with the losartan and BP For now letss have him cut it in 1/2 and with someone ([place]??) will need recheck in 1 week    Thanks

## 2017-07-11 ENCOUNTER — Encounter (HOSPITAL_COMMUNITY)
Admission: RE | Disposition: A | Discharge status: Home / Self Care | Payer: Self-pay | Source: Ambulatory Visit | Attending: Internal Medicine

## 2017-07-11 ENCOUNTER — Telehealth: Payer: Self-pay | Admitting: Family Medicine

## 2017-07-11 ENCOUNTER — Other Ambulatory Visit: Payer: Self-pay | Admitting: Family Medicine

## 2017-07-11 ENCOUNTER — Ambulatory Visit (HOSPITAL_COMMUNITY)
Admission: RE | Admit: 2017-07-11 | Discharge: 2017-07-11 | Disposition: A | Discharge status: Home / Self Care | Payer: Medicare Other | Source: Ambulatory Visit | Attending: Internal Medicine | Admitting: Internal Medicine

## 2017-07-11 DIAGNOSIS — E1122 Type 2 diabetes mellitus with diabetic chronic kidney disease: Secondary | ICD-10-CM | POA: Insufficient documentation

## 2017-07-11 DIAGNOSIS — Z955 Presence of coronary angioplasty implant and graft: Secondary | ICD-10-CM | POA: Diagnosis not present

## 2017-07-11 DIAGNOSIS — Z8673 Personal history of transient ischemic attack (TIA), and cerebral infarction without residual deficits: Secondary | ICD-10-CM | POA: Diagnosis not present

## 2017-07-11 DIAGNOSIS — I5041 Acute combined systolic (congestive) and diastolic (congestive) heart failure: Secondary | ICD-10-CM | POA: Insufficient documentation

## 2017-07-11 DIAGNOSIS — N183 Chronic kidney disease, stage 3 (moderate): Secondary | ICD-10-CM | POA: Insufficient documentation

## 2017-07-11 DIAGNOSIS — I130 Hypertensive heart and chronic kidney disease with heart failure and stage 1 through stage 4 chronic kidney disease, or unspecified chronic kidney disease: Secondary | ICD-10-CM | POA: Insufficient documentation

## 2017-07-11 DIAGNOSIS — Z5309 Procedure and treatment not carried out because of other contraindication: Secondary | ICD-10-CM | POA: Diagnosis not present

## 2017-07-11 DIAGNOSIS — F329 Major depressive disorder, single episode, unspecified: Secondary | ICD-10-CM | POA: Diagnosis not present

## 2017-07-11 DIAGNOSIS — I2510 Atherosclerotic heart disease of native coronary artery without angina pectoris: Secondary | ICD-10-CM | POA: Insufficient documentation

## 2017-07-11 DIAGNOSIS — I4891 Unspecified atrial fibrillation: Secondary | ICD-10-CM | POA: Diagnosis not present

## 2017-07-11 DIAGNOSIS — Z7901 Long term (current) use of anticoagulants: Secondary | ICD-10-CM | POA: Diagnosis not present

## 2017-07-11 LAB — GLUCOSE, CAPILLARY: Glucose-Capillary: 230 mg/dL — ABNORMAL HIGH (ref 65–99)

## 2017-07-11 SURGERY — BIV ICD INSERTION CRT-D

## 2017-07-11 MED ORDER — MUPIROCIN 2 % EX OINT
TOPICAL_OINTMENT | CUTANEOUS | Status: AC
  Administered 2017-07-11: 08:00:00
  Filled 2017-07-11: qty 22

## 2017-07-11 NOTE — Telephone Encounter (Signed)
Pt came in office and dropped a copy of his mail from [facility], requesting to change his Prescribe Medicine Novolin to the alternative that they are suggesting Humulin 70/30. Papers were placed in [facility] team folder. Call pt if have any questions [phone]

## 2017-07-11 NOTE — H&P (Signed)
Pt for device implantation But he had eaten breakfast and taken his apixoban this am Have rescheduled procedure for 3/1

## 2017-07-11 NOTE — H&P (View-Only) (Signed)
Pt for device implantation But he had eaten breakfast and taken his apixoban this am Have rescheduled procedure for 3/1

## 2017-07-11 NOTE — Progress Notes (Signed)
Pt states he ate peanut butter crackers and applesauce at 0600 and took his eliquis.  Dr Caryl Comes notified and came to talk with pt.  Pt cancelled to be contacted by office to reschedule

## 2017-07-11 NOTE — Progress Notes (Signed)
Spoke with pt in detail regarding his rescheduling of ICD implant. We discussed directions on medications and NPO the day before and day of his procedure. He was instructed to hold Eliquis starting Feb 28 for his procedure scheduled March 1. Pt repeated instructions and stated he understood. I encouraged him to contact the office for any additional questions. He verbalized understanding and stated he will pick up his instructions and CHG soap today.

## 2017-07-14 NOTE — Telephone Encounter (Signed)
Clinical info completed on medication change form.  Place form in Dr. Manual Meier box for completion.  Ottis Stain, CMA

## 2017-07-17 NOTE — Telephone Encounter (Signed)
Please call patient and have him come in to see someone for hospital follow up or Dr. Valentina Lucks for med management. Per chart review, he is not on a 70/30 insulin (neither novolin or humalin), but instead novolog. There is some concern for cognitive decline, and i'm afraid if we send a new type of insulin without a visit he may become confused and misdose.

## 2017-07-17 NOTE — Telephone Encounter (Signed)
Pt has an appt on 07/24/2017 with Dr. Lindell Noe. Ottis Stain, CMA

## 2017-07-18 NOTE — Telephone Encounter (Signed)
Pt wants to know if Dr Lindell Noe has made the change in his medication or does he need to wait to discuss this on Feb 28?  Please advise

## 2017-07-21 ENCOUNTER — Other Ambulatory Visit: Payer: Self-pay | Admitting: *Deleted

## 2017-07-21 ENCOUNTER — Telehealth: Payer: Self-pay | Admitting: Cardiology

## 2017-07-21 MED ORDER — APIXABAN 5 MG PO TABS: 5.0000 mg | ORAL_TABLET | Freq: Two times a day (BID) | ORAL | 3 refills | Status: DC

## 2017-07-21 MED ORDER — APIXABAN 5 MG PO TABS: 5.0000 mg | ORAL_TABLET | Freq: Two times a day (BID) | ORAL | 11 refills | Status: DC

## 2017-07-21 NOTE — Telephone Encounter (Signed)
Spoke with patient in regards to a form that was to be faxed to Dr. Meda Coffee from [facility] pertaining to patient's Eliquis prescription.  I told patent that I would follow up on this and he verbalized understanding.Marland KitchenMarland Kitchen

## 2017-07-21 NOTE — Telephone Encounter (Signed)
New message     *STAT* If patient is at the pharmacy, call can be transferred to refill team.   1. Which medications need to be refilled? (please list name of each medication and dose if known) eliquis 5 mg  2. Which pharmacy/location (including street and city if local pharmacy) is medication to be sent to? [facility]  3. Do they need a 30 day or 90 day supply? 30 day   Pt wants to know if there are samples available.

## 2017-07-21 NOTE — Telephone Encounter (Signed)
New message     Pt called to ask if fax was received from [facility]?

## 2017-07-23 ENCOUNTER — Ambulatory Visit: Payer: Medicare Other

## 2017-07-24 ENCOUNTER — Ambulatory Visit (INDEPENDENT_AMBULATORY_CARE_PROVIDER_SITE_OTHER): Payer: Medicare Other | Admitting: Family Medicine

## 2017-07-24 ENCOUNTER — Other Ambulatory Visit: Payer: Self-pay

## 2017-07-24 DIAGNOSIS — E119 Type 2 diabetes mellitus without complications: Secondary | ICD-10-CM | POA: Diagnosis not present

## 2017-07-24 DIAGNOSIS — Z794 Long term (current) use of insulin: Secondary | ICD-10-CM

## 2017-07-24 DIAGNOSIS — R413 Other amnesia: Secondary | ICD-10-CM | POA: Diagnosis not present

## 2017-07-24 DIAGNOSIS — IMO0001 Reserved for inherently not codable concepts without codable children: Secondary | ICD-10-CM

## 2017-07-24 MED ORDER — INSULIN ISOPHANE & REGULAR (HUMAN 70-30)100 UNIT/ML KWIKPEN: 55.0000 [IU] | PEN_INJECTOR | Freq: Every day | SUBCUTANEOUS | 1 refills | Status: DC

## 2017-07-24 NOTE — Progress Notes (Signed)
   CC: DM and hospital follow up   HPI DM - patient recently in ICU and intubated 2/2 DKA. Was discharged with Lantus and SSI, but patient doesn't recall this and did not pick these up. States his CBGs have been ~200s since leaving the hospital. Denies lows. He has been taking the 70/30 as previously prescribed.   Memory change - states he does have trouble remembering things. He recalls the events of recent hospital admission clearly in detail however. Has questions about MRI findings with hx of acute stroke and one or more old CVAs.   ROS: denies CP, SOB, abd pain, dysuria.    CC, SH/smoking status, and VS noted  Objective: BP 132/76 (BP Location: Right Arm, Patient Position: Sitting, Cuff Size: Normal)   Pulse 70   Temp 98.3 F (36.8 C) (Oral)   Ht 6' 1"$  (1.854 m)   Wt 233 lb (105.7 kg)   SpO2 97%   BMI 30.74 kg/m  Gen: NAD, alert, cooperative, and pleasant obese male.  HEENT: NCAT, EOMI, PERRL CV: RRR, no murmur Resp: CTAB, no wheezes, non-labored Neuro: Alert and oriented, Speech clear, No gross deficits  Assessment and plan:  IDDM (insulin dependent diabetes mellitus) ([place]) I would like for him to be on lantus and sliding scale, but he is significantly limited by cognitive decline. Given that he was recently admitted with DKA, will continue his 70/30 insulin, but change to humalin as preferred by his insurance company. Attempting to simplify his medication regimen in order to hopefully increase compliance. See memory change problem.   Memory change Patient with recent MRI with significant atrophy. Have been suspicious of dementia prior to this evaluation given his trouble with recalling appt times. Referred to geriatric clinic for full memory eval. States he has a son in the area, but they are not involved in each other's healthcare. Endorses trouble with memory. Also placed [facility] referral for possible in home pharmacy assessment for compliance.    Orders Placed This  Encounter  Procedures  . AMB Referral to [facility]    Referral Priority:   Routine    Referral Type:   Consultation    Referral Reason:   [facility]    Number of Visits Requested:   1    Meds ordered this encounter  Medications  . Insulin Isophane & Regular Human (HUMULIN 70/30 [id number]) (70-30) 100 UNIT/ML PEN    Sig: Inject 55 Units into the skin daily.    Dispense:  15 mL    Refill:  1     Ralene Ok, MD, PGY2 07/25/2017 9:11 PM

## 2017-07-24 NOTE — Patient Instructions (Signed)
It was a pleasure to see you today! Thank you for choosing [facility] for your primary care. Anthony [name] was seen for diabetes.   Our plans for today were:  Decrease your humalin to 55units in the morning.   Come back to see Dr. McDiarmid for a full memory assessment.   Best,  Dr. Lindell Noe

## 2017-07-25 ENCOUNTER — Ambulatory Visit (HOSPITAL_COMMUNITY)
Admission: RE | Disposition: A | Discharge status: Home / Self Care | Payer: Self-pay | Source: Ambulatory Visit | Attending: Internal Medicine

## 2017-07-25 ENCOUNTER — Ambulatory Visit (HOSPITAL_COMMUNITY)
Admission: RE | Admit: 2017-07-25 | Discharge: 2017-07-26 | Disposition: A | Discharge status: Home / Self Care | Payer: Medicare Other | Source: Ambulatory Visit | Attending: Internal Medicine | Admitting: Internal Medicine

## 2017-07-25 ENCOUNTER — Encounter (HOSPITAL_COMMUNITY): Payer: Self-pay | Admitting: *Deleted

## 2017-07-25 DIAGNOSIS — N183 Chronic kidney disease, stage 3 (moderate): Secondary | ICD-10-CM | POA: Insufficient documentation

## 2017-07-25 DIAGNOSIS — I428 Other cardiomyopathies: Secondary | ICD-10-CM

## 2017-07-25 DIAGNOSIS — Z89422 Acquired absence of other left toe(s): Secondary | ICD-10-CM | POA: Diagnosis not present

## 2017-07-25 DIAGNOSIS — I5042 Chronic combined systolic (congestive) and diastolic (congestive) heart failure: Secondary | ICD-10-CM | POA: Diagnosis not present

## 2017-07-25 DIAGNOSIS — I130 Hypertensive heart and chronic kidney disease with heart failure and stage 1 through stage 4 chronic kidney disease, or unspecified chronic kidney disease: Secondary | ICD-10-CM | POA: Insufficient documentation

## 2017-07-25 DIAGNOSIS — R569 Unspecified convulsions: Secondary | ICD-10-CM | POA: Insufficient documentation

## 2017-07-25 DIAGNOSIS — Z8249 Family history of ischemic heart disease and other diseases of the circulatory system: Secondary | ICD-10-CM | POA: Insufficient documentation

## 2017-07-25 DIAGNOSIS — Z6829 Body mass index (BMI) 29.0-29.9, adult: Secondary | ICD-10-CM | POA: Diagnosis not present

## 2017-07-25 DIAGNOSIS — G4733 Obstructive sleep apnea (adult) (pediatric): Secondary | ICD-10-CM | POA: Diagnosis not present

## 2017-07-25 DIAGNOSIS — I482 Chronic atrial fibrillation, unspecified: Secondary | ICD-10-CM | POA: Diagnosis present

## 2017-07-25 DIAGNOSIS — F329 Major depressive disorder, single episode, unspecified: Secondary | ICD-10-CM | POA: Insufficient documentation

## 2017-07-25 DIAGNOSIS — Z7901 Long term (current) use of anticoagulants: Secondary | ICD-10-CM

## 2017-07-25 DIAGNOSIS — K219 Gastro-esophageal reflux disease without esophagitis: Secondary | ICD-10-CM | POA: Insufficient documentation

## 2017-07-25 DIAGNOSIS — E119 Type 2 diabetes mellitus without complications: Secondary | ICD-10-CM

## 2017-07-25 DIAGNOSIS — M1990 Unspecified osteoarthritis, unspecified site: Secondary | ICD-10-CM | POA: Insufficient documentation

## 2017-07-25 DIAGNOSIS — E785 Hyperlipidemia, unspecified: Secondary | ICD-10-CM | POA: Diagnosis not present

## 2017-07-25 DIAGNOSIS — I2510 Atherosclerotic heart disease of native coronary artery without angina pectoris: Secondary | ICD-10-CM | POA: Diagnosis not present

## 2017-07-25 DIAGNOSIS — I252 Old myocardial infarction: Secondary | ICD-10-CM | POA: Diagnosis not present

## 2017-07-25 DIAGNOSIS — E669 Obesity, unspecified: Secondary | ICD-10-CM | POA: Diagnosis not present

## 2017-07-25 DIAGNOSIS — Z87891 Personal history of nicotine dependence: Secondary | ICD-10-CM | POA: Insufficient documentation

## 2017-07-25 DIAGNOSIS — Z794 Long term (current) use of insulin: Secondary | ICD-10-CM | POA: Insufficient documentation

## 2017-07-25 DIAGNOSIS — E1122 Type 2 diabetes mellitus with diabetic chronic kidney disease: Secondary | ICD-10-CM | POA: Insufficient documentation

## 2017-07-25 DIAGNOSIS — I5022 Chronic systolic (congestive) heart failure: Secondary | ICD-10-CM | POA: Diagnosis present

## 2017-07-25 DIAGNOSIS — N184 Chronic kidney disease, stage 4 (severe): Secondary | ICD-10-CM | POA: Diagnosis present

## 2017-07-25 DIAGNOSIS — I4821 Permanent atrial fibrillation: Secondary | ICD-10-CM | POA: Diagnosis present

## 2017-07-25 DIAGNOSIS — Z95810 Presence of automatic (implantable) cardiac defibrillator: Secondary | ICD-10-CM | POA: Diagnosis not present

## 2017-07-25 DIAGNOSIS — Z959 Presence of cardiac and vascular implant and graft, unspecified: Secondary | ICD-10-CM

## 2017-07-25 DIAGNOSIS — Z955 Presence of coronary angioplasty implant and graft: Secondary | ICD-10-CM | POA: Insufficient documentation

## 2017-07-25 HISTORY — DX: Presence of automatic (implantable) cardiac defibrillator: Z95.810

## 2017-07-25 HISTORY — PX: BIV ICD INSERTION CRT-D: EP1195

## 2017-07-25 LAB — BASIC METABOLIC PANEL
Anion gap: 11 (ref 5–15)
BUN: 16 mg/dL (ref 6–20)
CO2: 22 mmol/L (ref 22–32)
Calcium: 8.6 mg/dL — ABNORMAL LOW (ref 8.9–10.3)
Chloride: 107 mmol/L (ref 101–111)
Creatinine, Ser: 2.24 mg/dL — ABNORMAL HIGH (ref 0.61–1.24)
GFR calc Af Amer: 33 mL/min — ABNORMAL LOW (ref >60)
GFR calc non Af Amer: 29 mL/min — ABNORMAL LOW (ref >60)
Glucose, Bld: 184 mg/dL — ABNORMAL HIGH (ref 65–99)
Potassium: 3.9 mmol/L (ref 3.5–5.1)
Sodium: 140 mmol/L (ref 135–145)

## 2017-07-25 LAB — CBC
HCT: 40 % (ref 39.0–52.0)
Hemoglobin: 13.2 g/dL (ref 13.0–17.0)
MCH: 29.6 pg (ref 26.0–34.0)
MCHC: 33 g/dL (ref 30.0–36.0)
MCV: 89.7 fL (ref 78.0–100.0)
Platelets: 144 10*3/uL — ABNORMAL LOW (ref 150–400)
RBC: 4.46 MIL/uL (ref 4.22–5.81)
RDW: 15.3 % (ref 11.5–15.5)
WBC: 7.2 10*3/uL (ref 4.0–10.5)

## 2017-07-25 LAB — GLUCOSE, CAPILLARY: Glucose-Capillary: 183 mg/dL — ABNORMAL HIGH (ref 65–99)

## 2017-07-25 LAB — SURGICAL PCR SCREEN
MRSA, PCR: NEGATIVE
Staphylococcus aureus: NEGATIVE

## 2017-07-25 SURGERY — BIV ICD INSERTION CRT-D

## 2017-07-25 MED ORDER — CEFAZOLIN SODIUM-DEXTROSE 1-4 GM/50ML-% IV SOLN
1.0000 g | Freq: Four times a day (QID) | INTRAVENOUS | Status: AC
  Administered 2017-07-25 – 2017-07-26 (×3): 1 g via INTRAVENOUS
  Filled 2017-07-25 (×3): qty 50

## 2017-07-25 MED ORDER — CHLORHEXIDINE GLUCONATE 4 % EX LIQD
60.0000 mL | Freq: Once | CUTANEOUS | Status: DC
  Filled 2017-07-25: qty 60

## 2017-07-25 MED ORDER — MIDAZOLAM HCL 5 MG/5ML IJ SOLN
INTRAMUSCULAR | Status: AC
  Filled 2017-07-25: qty 5

## 2017-07-25 MED ORDER — ONDANSETRON HCL 4 MG/2ML IJ SOLN: 4.0000 mg | Freq: Four times a day (QID) | INTRAMUSCULAR | Status: DC | PRN

## 2017-07-25 MED ORDER — AMIODARONE HCL 200 MG PO TABS
400.0000 mg | ORAL_TABLET | Freq: Two times a day (BID) | ORAL | Status: DC
  Administered 2017-07-25 – 2017-07-26 (×2): 400 mg via ORAL
  Filled 2017-07-25 (×2): qty 2

## 2017-07-25 MED ORDER — SODIUM CHLORIDE 0.9 % IV SOLN
INTRAVENOUS | Status: DC
  Administered 2017-07-25: 12:00:00 via INTRAVENOUS

## 2017-07-25 MED ORDER — FENTANYL CITRATE (PF) 100 MCG/2ML IJ SOLN
INTRAMUSCULAR | Status: AC
  Filled 2017-07-25: qty 2

## 2017-07-25 MED ORDER — CEFAZOLIN SODIUM-DEXTROSE 2-4 GM/100ML-% IV SOLN
2.0000 g | INTRAVENOUS | Status: AC
  Administered 2017-07-25: 2 g via INTRAVENOUS

## 2017-07-25 MED ORDER — MUPIROCIN 2 % EX OINT
TOPICAL_OINTMENT | CUTANEOUS | Status: AC
  Filled 2017-07-25: qty 22

## 2017-07-25 MED ORDER — FUROSEMIDE 10 MG/ML IJ SOLN
INTRAMUSCULAR | Status: DC | PRN
  Administered 2017-07-25: 40 mg via INTRAVENOUS

## 2017-07-25 MED ORDER — LIDOCAINE HCL (PF) 1 % IJ SOLN
INTRAMUSCULAR | Status: AC
  Filled 2017-07-25: qty 60

## 2017-07-25 MED ORDER — FENTANYL CITRATE (PF) 100 MCG/2ML IJ SOLN
INTRAMUSCULAR | Status: DC | PRN
  Administered 2017-07-25 (×3): 25 ug via INTRAVENOUS

## 2017-07-25 MED ORDER — IOPAMIDOL (ISOVUE-370) INJECTION 76%
INTRAVENOUS | Status: DC | PRN
  Administered 2017-07-25: 10 mL via INTRAVENOUS

## 2017-07-25 MED ORDER — SODIUM CHLORIDE 0.9 % IR SOLN
80.0000 mg | Status: AC
  Administered 2017-07-25: 80 mg

## 2017-07-25 MED ORDER — METOPROLOL TARTRATE 50 MG PO TABS
150.0000 mg | ORAL_TABLET | Freq: Two times a day (BID) | ORAL | Status: DC
  Administered 2017-07-25 – 2017-07-26 (×2): 150 mg via ORAL
  Filled 2017-07-25 (×2): qty 1

## 2017-07-25 MED ORDER — HEPARIN (PORCINE) IN NACL 2-0.9 UNIT/ML-% IJ SOLN
INTRAMUSCULAR | Status: AC
  Filled 2017-07-25: qty 500

## 2017-07-25 MED ORDER — SODIUM CHLORIDE 0.9 % IV SOLN: INTRAVENOUS | Status: AC

## 2017-07-25 MED ORDER — MUPIROCIN 2 % EX OINT
1.0000 "application " | TOPICAL_OINTMENT | Freq: Once | CUTANEOUS | Status: AC
  Administered 2017-07-25: 1 via TOPICAL

## 2017-07-25 MED ORDER — HEPARIN (PORCINE) IN NACL 2-0.9 UNIT/ML-% IJ SOLN
INTRAMUSCULAR | Status: AC | PRN
  Administered 2017-07-25: 500 mL

## 2017-07-25 MED ORDER — LIDOCAINE HCL (PF) 1 % IJ SOLN
INTRAMUSCULAR | Status: DC | PRN
  Administered 2017-07-25: 45 mL

## 2017-07-25 MED ORDER — IOPAMIDOL (ISOVUE-370) INJECTION 76%
INTRAVENOUS | Status: AC
  Filled 2017-07-25: qty 50

## 2017-07-25 MED ORDER — GENTAMICIN SULFATE 40 MG/ML IJ SOLN
INTRAMUSCULAR | Status: AC
  Filled 2017-07-25: qty 2

## 2017-07-25 MED ORDER — SODIUM CHLORIDE 0.9 % IV SOLN
INTRAVENOUS | Status: DC
  Administered 2017-07-25: 11:00:00 via INTRAVENOUS

## 2017-07-25 MED ORDER — MIDAZOLAM HCL 5 MG/5ML IJ SOLN
INTRAMUSCULAR | Status: DC | PRN
  Administered 2017-07-25: 1 mg via INTRAVENOUS
  Administered 2017-07-25: 2 mg via INTRAVENOUS
  Administered 2017-07-25: 1 mg via INTRAVENOUS

## 2017-07-25 MED ORDER — FUROSEMIDE 10 MG/ML IJ SOLN
INTRAMUSCULAR | Status: AC
  Filled 2017-07-25: qty 4

## 2017-07-25 MED ORDER — ACETAMINOPHEN 325 MG PO TABS
325.0000 mg | ORAL_TABLET | ORAL | Status: DC | PRN
  Administered 2017-07-25: 650 mg via ORAL
  Filled 2017-07-25: qty 2

## 2017-07-25 MED ORDER — CEFAZOLIN SODIUM-DEXTROSE 2-4 GM/100ML-% IV SOLN
INTRAVENOUS | Status: AC
  Filled 2017-07-25: qty 100

## 2017-07-25 SURGICAL SUPPLY — 17 items
BALLN ATTAIN 80 (BALLOONS) ×2
BALLOON ATTAIN 80 (BALLOONS) IMPLANT
CABLE SURGICAL S-101-97-12 (CABLE) ×1 IMPLANT
CATH CPS DIRECT 135 DS2C020 (CATHETERS) ×1 IMPLANT
ICD CLARIA MRI DTMA1QQ (ICD Generator) ×1 IMPLANT
KIT ESSENTIALS PG (KITS) ×1 IMPLANT
LEAD QUARTET 1458Q-86CM (Lead) ×1 IMPLANT
LEAD SPRINT QUAT SEC 6935M-62 (Lead) ×1 IMPLANT
PAD DEFIB LIFELINK (PAD) ×1 IMPLANT
PIN PLUG IS-1 DEFIB (PIN) ×1 IMPLANT
SHEATH CLASSIC 7F (SHEATH) IMPLANT
SHEATH CLASSIC 9.5F (SHEATH) ×1 IMPLANT
SHEATH CLASSIC 9F (SHEATH) ×1 IMPLANT
SLITTER UNIVERSAL DS2A003 (MISCELLANEOUS) ×1 IMPLANT
TRAY PACEMAKER INSERTION (PACKS) ×1 IMPLANT
WIRE ACUITY WHISPER EDS 4648 (WIRE) ×2 IMPLANT
WIRE HI TORQ VERSACORE-J 145CM (WIRE) ×2 IMPLANT

## 2017-07-25 NOTE — H&P (Signed)
Patient Care Team: Sela Hilding, MD as PCP - General (Family Medicine) Tobi Bastos, RN as [place] Management   HPI  Anthony [name] is a 67 y.o. male Seen originally for consideration of AV ablation for atrial fibrillation with a poorly controlled ventricular response.  Is a history of ischemic heart disease with stenting 2003.  He was noted to have low voltage and left ventricular hypertrophy and the issue was raised as to whether he might have transthyretin amyloid technetium; pyrophosphate imaging was notable for only grade 1 findings.    1`/19 hospitalized for new onset seizures assoc with acute R temopral stroke.   Occurred in the context of DKA and unresponsive and was intubated at that time    DATE TEST    7/15 Myoview  No ischemia  9/15 Echo EF 55-60%   6/18 Echo EF 25-30% Severe LAE    Date Cr K Mg Hgb  10/17 1.55 3.4  13.7  7/18 1.82  1.5  13.8  9/18 2.2 4.5  13.5  3/19 2.2 3.9      Still with short at less than 100 ft Sleeping flat-2 pillows  Edema intermittent     Records and Results Reviewed hospital records   Past Medical History:  Diagnosis Date  . Arthritis   . CAD in native artery    a. reported MI 2000, 2001, s/p stenting of the circumflex lesion extending into the second obtuse marginal branch with a drug-eluting stent in 2003. b. low risk nuc 2015.  Marland Kitchen Chronic atrial fibrillation ([place])   . Chronic combined systolic and diastolic CHF (congestive heart failure) ([place])    a. Previously diastolic, then EF 08-14% in 10/2016.  . CKD (chronic kidney disease), stage III ([place])   . Depression   . Diabetes mellitus   . Edema of extremities   . Erectile dysfunction   . GERD (gastroesophageal reflux disease)   . Hyperlipidemia   . Hypertension   . Myocardial infarction ([place]) 2000&2001  . Obesity   . Onychomycosis   . OSA (obstructive sleep apnea)   . Seizures ([place])   . Tubular adenoma of colon  10/2002    Past Surgical History:  Procedure Laterality Date  . ANGIOPLASTY  2001   stent x 1  . COLONOSCOPY  2000?   negative  . FOOT ARTHROTOMY Right   . TOE AMPUTATION Left 2012    Current Facility-Administered Medications  Medication Dose Route Frequency Provider Last Rate Last Dose  . 0.9 %  sodium chloride infusion   Intravenous Continuous Chanetta Marshall K, NP 50 mL/hr at 07/25/17 1050    . ceFAZolin (ANCEF) IVPB 2g/100 mL premix  2 g Intravenous On Call Chanetta Marshall K, NP      . chlorhexidine (HIBICLENS) 4 % liquid 4 application  60 mL Topical Once Lynnell Jude, [employer] K, NP      . gentamicin (GARAMYCIN) 80 mg in sodium chloride irrigation 0.9 % 500 mL irrigation  80 mg Irrigation On Call Chanetta Marshall K, NP      . mupirocin ointment (BACTROBAN) 2 %             Allergies  Allergen Reactions  . Enalapril Maleate Cough      Social History   Tobacco Use  . Smoking status: Former Smoker    Packs/day: 0.25    Years: 45.00    Pack years: 11.25    Types: Cigarettes    Start date: 05/28/1967  Last attempt to quit: 06/11/2016    Years since quitting: 1.1  . Smokeless tobacco: Never Used  Substance Use Topics  . Alcohol use: No    Comment: quit in 1993  . Drug use: No     Family History  Problem Relation Age of Onset  . Heart attack Mother   . Heart attack Father   . Colon cancer Neg Hx   . Stomach cancer Neg Hx      Current Meds  Medication Sig  . apixaban (ELIQUIS) 5 MG TABS tablet Take 1 tablet (5 mg total) by mouth 2 (two) times daily.  . calcitRIOL (ROCALTROL) 0.25 MCG capsule TAKE 0.25 MG BY MOUTH daily  . FLUoxetine (PROZAC) 20 MG capsule Take 1 capsule (20 mg total) by mouth daily.  . furosemide (LASIX) 40 MG tablet Take 1 tablet (40 mg total) by mouth 2 (two) times daily.  Marland Kitchen gabapentin (NEURONTIN) 300 MG capsule TAKE ONE CAPSULE BY MOUTH AT BEDTIME (Patient taking differently: TAKE ONE CAPSULE (300mg )BY MOUTH AT BEDTIME)  . hydrALAZINE (APRESOLINE) 25 MG  tablet Take 1 tablet (25 mg total) by mouth every 4 (four) hours as needed (Give SBP >160 or DBP >110).  . Insulin Isophane & Regular Human (HUMULIN 70/30 KWIKPEN) (70-30) 100 UNIT/ML PEN Inject 55 Units into the skin daily.  Marland Kitchen losartan (COZAAR) 25 MG tablet Take 1 tablet (25 mg total) by mouth daily.  . magnesium oxide (MAG-OX) 400 (241.3 Mg) MG tablet Take 0.5 tablets (200 mg total) by mouth daily.  . metoprolol tartrate (LOPRESSOR) 100 MG tablet Take 1 tablet (100 mg total) by mouth 2 (two) times daily.  . Multiple Vitamin (MULTIVITAMIN) tablet Take 1 tablet by mouth daily.    . nitroGLYCERIN (NITROSTAT) 0.4 MG SL tablet Place 0.4 mg under the tongue every 5 (five) minutes as needed for chest pain. If no relief by 3rd tab, call 911   . Omega 3 1000 MG CAPS Take 1,000 mg by mouth 3 (three) times daily.   Marland Kitchen omeprazole (PRILOSEC) 40 MG capsule TAKE ONE CAPSULE BY MOUTH DAILY (Patient taking differently: TAKE ONE CAPSULE (40mg ) BY MOUTH DAILY)  . oxymorphone (OPANA) 5 MG tablet Take 5 mg by mouth every 6 (six) hours as needed for pain (max 3 tablets daily). Per Dr Brien Few  . polyethylene glycol (MIRALAX / GLYCOLAX) packet Take 17 g by mouth daily.  . potassium chloride SA (K-DUR,KLOR-CON) 20 MEQ tablet Take 1 tablet (20 mEq total) by mouth 2 (two) times daily.  . rosuvastatin (CRESTOR) 20 MG tablet Take 1 tablet (20 mg total) by mouth daily.     Review of Systems negative except from HPI and PMH  Physical Exam Pulse (!) 143   Temp 98.2 F (36.8 C) (Oral)   Resp 18   Ht 6\' 1"  (1.854 m)   Wt 220 lb (99.8 kg)   SpO2 99%   BMI 29.03 kg/m  Well developed and well nourished in no acute distress HENT normal E scleral and icterus clear Neck Supple JVP flat; carotids brisk and full Clear to ausculation Irregular rate and rhythm, no murmurs gallops or rub Soft with active bowel sounds No clubbing cyanos2+Edema Alert and oriented, grossly normal motor and sensory function Skin Warm and  Dry  ECG Personally reviewed  AFib wqitrh RVR 130   Assessment and  Plan  Atrial fibrillation-permanent rapid ventricular response  Low-voltage limb leads and severe concentric LVH concerning for amyloid   Congestive heart failure-chronic-systolic-class IIb 3  Hypertension  Renal Insufficiency gd 4  Presyncope  Nonischemic cardiomyopathy-new  PT   Here for CRT-D implant with anticpated AV ablation in about 3 weeks  Volume overloaded so will diurese overnight with attention to kidney,

## 2017-07-25 NOTE — Assessment & Plan Note (Signed)
I would like for him to be on lantus and sliding scale, but he is significantly limited by cognitive decline. Given that he was recently admitted with DKA, will continue his 70/30 insulin, but change to humalin as preferred by his insurance company. Attempting to simplify his medication regimen in order to hopefully increase compliance. See memory change problem.

## 2017-07-25 NOTE — Progress Notes (Addendum)
   07/25/17 2000  Vitals  Temp 97.7 F (36.5 C)  Temp Source Oral  BP (!) 153/119  MAP (mmHg) 128  BP Location Right Arm  BP Method Automatic  Patient Position (if appropriate) Lying  Pulse Rate (!) 127  Pulse Rate Source Monitor  ECG Heart Rate (!) 121  Cardiac Rhythm Ventricular paced  Resp 15  Oxygen Therapy  SpO2 96 %  O2 Device Room Air  Pain Assessment  Pain Assessment No/denies pain  Pain Score 0  PCA/Epidural/Spinal Assessment  Respiratory Pattern Regular  Glasgow Coma Scale  Eye Opening 4  Best Verbal Response (NON-intubated) 5  Best Motor Response 6  Glasgow Coma Scale Score 15  Md paged, BP meds given early, also reported Monitor Tech concern for pacer spike falling on top of the P wave, nursing will cont to monitor

## 2017-07-25 NOTE — Interval H&P Note (Signed)
History and Physical Interval Note:  07/25/2017 11:14 AM  Anthony [name]  has presented today for surgery, with the diagnosis of hf  The various methods of treatment have been discussed with the patient and family. After consideration of risks, benefits and other options for treatment, the patient has consented to  Procedure(s): BIV ICD INSERTION CRT-D (N/A) as a surgical intervention .  The patient's history has been reviewed, patient examined, no change in status, stable for surgery.  I have reviewed the patient's chart and labs.  Questions were answered to the patient's satisfaction.     Virl Axe

## 2017-07-25 NOTE — Assessment & Plan Note (Signed)
Patient with recent MRI with significant atrophy. Have been suspicious of dementia prior to this evaluation given his trouble with recalling appt times. Referred to geriatric clinic for full memory eval. States he has a son in the area, but they are not involved in each other's healthcare. Endorses trouble with memory. Also placed [facility] referral for possible in home pharmacy assessment for compliance.

## 2017-07-25 NOTE — Progress Notes (Signed)
CVP quite high in lab,  Lasix 40 Iv given  Would hope for vigorous diuresis, and if not, would give 80 in am  With renal insufficiency, have ordered BMET  Also with rapid rates have added amio for rate control with anticipation of AV ablation in a few weeks   Will increase metoprolol tartrate >>150 bid

## 2017-07-26 ENCOUNTER — Encounter (HOSPITAL_COMMUNITY): Payer: Self-pay | Admitting: Cardiology

## 2017-07-26 ENCOUNTER — Ambulatory Visit (HOSPITAL_COMMUNITY): Payer: Medicare Other

## 2017-07-26 DIAGNOSIS — I428 Other cardiomyopathies: Secondary | ICD-10-CM

## 2017-07-26 DIAGNOSIS — I482 Chronic atrial fibrillation: Secondary | ICD-10-CM | POA: Diagnosis not present

## 2017-07-26 DIAGNOSIS — E1122 Type 2 diabetes mellitus with diabetic chronic kidney disease: Secondary | ICD-10-CM | POA: Diagnosis not present

## 2017-07-26 DIAGNOSIS — I130 Hypertensive heart and chronic kidney disease with heart failure and stage 1 through stage 4 chronic kidney disease, or unspecified chronic kidney disease: Secondary | ICD-10-CM | POA: Diagnosis not present

## 2017-07-26 DIAGNOSIS — Z95810 Presence of automatic (implantable) cardiac defibrillator: Secondary | ICD-10-CM

## 2017-07-26 HISTORY — DX: Presence of automatic (implantable) cardiac defibrillator: Z95.810

## 2017-07-26 LAB — BASIC METABOLIC PANEL
Anion gap: 11 (ref 5–15)
BUN: 18 mg/dL (ref 6–20)
CO2: 22 mmol/L (ref 22–32)
Calcium: 8.5 mg/dL — ABNORMAL LOW (ref 8.9–10.3)
Chloride: 104 mmol/L (ref 101–111)
Creatinine, Ser: 2.09 mg/dL — ABNORMAL HIGH (ref 0.61–1.24)
GFR calc Af Amer: 36 mL/min — ABNORMAL LOW (ref >60)
GFR calc non Af Amer: 31 mL/min — ABNORMAL LOW (ref >60)
Glucose, Bld: 210 mg/dL — ABNORMAL HIGH (ref 65–99)
Potassium: 3.3 mmol/L — ABNORMAL LOW (ref 3.5–5.1)
Sodium: 137 mmol/L (ref 135–145)

## 2017-07-26 MED ORDER — OFF THE BEAT BOOK
Freq: Once | Status: AC
  Administered 2017-07-26: 1
  Filled 2017-07-26: qty 1

## 2017-07-26 MED ORDER — POTASSIUM CHLORIDE CRYS ER 20 MEQ PO TBCR
40.0000 meq | EXTENDED_RELEASE_TABLET | Freq: Once | ORAL | Status: AC
  Administered 2017-07-26: 40 meq via ORAL
  Filled 2017-07-26: qty 2

## 2017-07-26 MED ORDER — METOPROLOL TARTRATE 75 MG PO TABS: 150.0000 mg | ORAL_TABLET | Freq: Two times a day (BID) | ORAL | 6 refills | Status: DC

## 2017-07-26 MED ORDER — AMIODARONE HCL 200 MG PO TABS: 400.0000 mg | ORAL_TABLET | Freq: Two times a day (BID) | ORAL | 6 refills | Status: DC

## 2017-07-26 MED ORDER — ACETAMINOPHEN 325 MG PO TABS: 325.0000 mg | ORAL_TABLET | ORAL | Status: DC | PRN

## 2017-07-26 MED ORDER — YOU HAVE A PACEMAKER BOOK
Freq: Once | Status: AC
  Administered 2017-07-26: 1
  Filled 2017-07-26: qty 1

## 2017-07-26 NOTE — Discharge Instructions (Signed)
° ° °  Supplemental Discharge Instructions for  Pacemaker/Defibrillator Patients  Activity No heavy lifting or vigorous activity with your left/right arm for 6 to 8 weeks.  Do not raise your left/right arm above your head for one week.  Gradually raise your affected arm as drawn below.           __         07/28/17                   07/29/17                      07/30/17                      07/31/17  NO DRIVING for   1 week  WOUND CARE - Keep the wound area clean and dry.   - No bandage is needed on the site.  DO  NOT apply any creams, oils, or ointments to the wound area. - If you notice any drainage or discharge from the wound, any swelling or bruising at the site, or you develop a fever > 101? F after you are discharged home, call the office at once.  Special Instructions - You are still able to use cellular telephones; use the ear opposite the side where you have your pacemaker/defibrillator.  Avoid carrying your cellular phone near your device. - When traveling through airports, show security personnel your identification card to avoid being screened in the metal detectors.  Ask the security personnel to use the hand wand. - Avoid arc welding equipment, TENS units (transcutaneous nerve stimulators).  Call the office for questions about other devices. - Avoid electrical appliances that are in poor condition or are not properly grounded. - Microwave ovens are safe to be near or to operate.  Additional information for defibrillator patients should your device go off: - If your device goes off ONCE and you feel fine afterward, notify the device clinic nurses. - If your device goes off ONCE and you do not feel well afterward, call 911. - If your device goes off TWICE, call 911. - If your device goes off THREE times in one day, call 911.  DO NOT DRIVE YOURSELF OR A FAMILY MEMBER WITH A DEFIBRILLATOR TO THE HOSPITAL--CALL 911.   On amiodarone take 2 of 200 mg tabs twice a day for 2 weeks,  beginning 08/10/17 decrease amiodarone and take 1 of 200 mg tab twice a day then beginning 08/24/17 decrease amiodarone to 200 mg tab daily.    We increased your metoprolol 75 mg tab take 2 tabs twice per day.   New prescription

## 2017-07-26 NOTE — Discharge Summary (Addendum)
Discharge Summary    Patient ID: Anthony [name],  MRN: [id number], DOB/AGE: 10-08-1950 67 y.o.  Admit date: 07/25/2017 Discharge date: 07/26/2017  Primary Care Provider: Sela Hilding Primary Cardiologist: Ena Dawley, MD   EP Dr. Caryl Comes   Discharge Diagnoses    Principal Problem:   NICM (nonischemic cardiomyopathy) [facility]) Active Problems:   IDDM (insulin dependent diabetes mellitus) ([place])   Chronic atrial fibrillation (HCC)   Chronic anticoagulation   CKD stage 3 due to type 2 diabetes mellitus ([place])   Permanent atrial fibrillation (HCC)   Congestive heart failure, NYHA class 3, chronic, systolic (HCC)   Atrial fibrillation, permanent ([place])   S/P ICD (internal cardiac defibrillator) procedure and BiV device,  07/25/17 Medtronic    Allergies Allergies  Allergen Reactions  . Enalapril Maleate Cough    Diagnostic Studies/Procedures    07/25/17  [name] [id number] @CSN$ @  Preop Dx: ATRIAL FIB WITH rvr AND ANTICIPATED  Postop Dx same/  Procedure:crt icd IMPLANT  Cx: None   ICD Criteria  _____________   History of Present Illness     67 y.o. male seen originally for consideration of AV ablation for atrial fibrillation with a poorly controlled ventricular response.  He has a history of ischemic heart disease with stenting 2003.  He was noted to have low voltage and left ventricular hypertrophy and the issue was raised as to whether he might have transthyretin amyloid technetium;pyrophosphate imaging was notable for only grade 1 findings.    1`/19 He was hospitalized for new onset seizures assoc with acute R temopral stroke.   Occurred in the context of DKA and unresponsive and was intubated at that time.    With a fib and RVR and need for rate control AV ablation seems the most reasonable approach and with LV dysfunction and presyncope plans for ICD and CRT implantation were made.  Medtronic device BiV ICD placed.   Hospital Course       Consultants: none  07/26/17 pt seen and evaluated by Dr. [name] and found stable for discharge, his BP was mildly elevated but had not yet had AM meds, and HR up but pt has higher dose of BB and amiodarone has been added.   He was neg  578 ml from IV lasix given with procedure for elevated CVP.   He was stable on day of discharge.  Ambulated in hall without problems.  No SOB, he feels his energy is better.  K+ 3.3 on day of discharge so KDur 40 meq given prior to discharge.   07/26/17 device interrogated and numbers were stable.  His CXR 2 V with satisfactory cardiac device placement, no pneumothorax, no pleural effusion + cardiomegaly with mild pulmonary vascular congestion.   _____________  Discharge Vitals Blood pressure (!) 175/143, pulse (!) 118, temperature 98.3 F (36.8 C), temperature source Oral, resp. rate (!) 28, height 6' 1"$  (1.854 m), weight 240 lb 8.4 oz (109.1 kg), SpO2 92 %.  Filed Weights   07/25/17 0950 07/26/17 0339  Weight: 220 lb (99.8 kg) 240 lb 8.4 oz (109.1 kg)  Per Dr. [name]  General:Pleasant affect, NAD Skin:Warm and dry, brisk capillary refill HEENT:normocephalic, sclera clear, mucus membranes moist Neck:supple, no JVD, no bruits  Heart:irregular without murmur, gallup, rub or click, ICD site stable. Lungs:clear without rales, rhonchi, or wheezes [id number], non tender, + BS, do not palpate liver spleen or masses Ext:no lower ext edema, 2+ pedal pulses, 2+ radial pulses Neuro:alert and oriented, MAE, follows commands, +  facial symmetry   Labs & Radiologic Studies    CBC Recent Labs    07/25/17 1014  WBC 7.2  HGB 13.2  HCT 40.0  MCV 89.7  PLT [id number]*   Basic Metabolic Panel Recent Labs    07/25/17 1014 07/26/17 0430  NA 140 137  K 3.9 3.3*  CL 107 104  CO2 22 22  GLUCOSE 184* 210*  BUN 16 18  CREATININE 2.24* 2.09*  CALCIUM 8.6* 8.5*   Liver Function Tests No results for input(s): AST, ALT, ALKPHOS, BILITOT, PROT, ALBUMIN in the last 72  hours. No results for input(s): LIPASE, AMYLASE in the last 72 hours. Cardiac Enzymes No results for input(s): CKTOTAL, CKMB, CKMBINDEX, TROPONINI in the last 72 hours. BNP Invalid input(s): POCBNP D-Dimer No results for input(s): DDIMER in the last 72 hours. Hemoglobin A1C No results for input(s): HGBA1C in the last 72 hours. Fasting Lipid Panel No results for input(s): CHOL, HDL, LDLCALC, TRIG, CHOLHDL, LDLDIRECT in the last 72 hours. Thyroid Function Tests No results for input(s): TSH, T4TOTAL, T3FREE, THYROIDAB in the last 72 hours.  Invalid input(s): FREET3 _____________  Dg Chest 2 View  Result Date: 07/26/2017 CLINICAL DATA:  Cardiac device placement EXAM: CHEST  2 VIEW COMPARISON:  06/13/2017 FINDINGS: Diffuse bilateral mild interstitial prominence. No pleural effusion or pneumothorax. No focal consolidation. Interval extubation. Interval removal of the nasogastric tube. Stable cardiomegaly. Interval placement of a cardiac pacemaker in satisfactory position. No acute osseous abnormality. IMPRESSION: 1. Interval placement of a cardiac pacemaker in satisfactory position. 2. Cardiomegaly with mild pulmonary vascular congestion. Electronically Signed   By: Kathreen Devoid   On: 07/26/2017 08:12   Disposition   Pt is being discharged home today in good condition.  Follow-up Plans & Appointments    Follow-up Information    [facility] Follow up on 08/07/2017.   Specialty:  Cardiology Why:  at 10:30AM Contact information: [address] [place]       Deboraha Sprang, MD Follow up on 10/31/2017.   Specialty:  Cardiology Why:  at 10:30AM Contact information: [address] [place] [address] [phone]          pacer ICD instruction.  On amiodarone take 2 of 200 mg tabs twice a day for 2 weeks, beginning 08/10/17 decrease amiodarone and take 1 of 200 mg tab twice a day then  beginning 08/24/17 decrease amiodarone to 200 mg tab daily.    We increased your metoprolol 75 mg tab take 2 tabs twice per day.   New prescription   Discharge Medications   Allergies as of 07/26/2017      Reactions   Enalapril Maleate Cough      Medication List    TAKE these medications   acetaminophen 325 MG tablet Commonly known as:  TYLENOL Take 1-2 tablets (325-650 mg total) by mouth every 4 (four) hours as needed for mild pain.   amiodarone 200 MG tablet Commonly known as:  PACERONE Take 2 tablets (400 mg total) by mouth 2 (two) times daily. 2 tabs twice a day for 2 weeks, beginning 08/10/17 decrease to 1 tab twice a day for 2 weeks, then  08/24/17 begin 1 200 mg daily.   apixaban 5 MG Tabs tablet Commonly known as:  ELIQUIS Take 1 tablet (5 mg total) by mouth 2 (two) times daily.   calcitRIOL 0.25 MCG capsule Commonly known as:  ROCALTROL TAKE 0.25 MG  BY MOUTH daily   FLUoxetine 20 MG capsule Commonly known as:  PROZAC Take 1 capsule (20 mg total) by mouth daily.   furosemide 40 MG tablet Commonly known as:  LASIX Take 1 tablet (40 mg total) by mouth 2 (two) times daily.   gabapentin 300 MG capsule Commonly known as:  NEURONTIN TAKE ONE CAPSULE BY MOUTH AT BEDTIME What changed:    how much to take  how to take this  when to take this   hydrALAZINE 25 MG tablet Commonly known as:  APRESOLINE Take 1 tablet (25 mg total) by mouth every 4 (four) hours as needed (Give SBP >160 or DBP >110).   Insulin Isophane & Regular Human (70-30) 100 UNIT/ML PEN Commonly known as:  HUMULIN 70/30 KWIKPEN Inject 55 Units into the skin daily.   losartan 25 MG tablet Commonly known as:  COZAAR Take 1 tablet (25 mg total) by mouth daily.   magnesium oxide 400 (241.3 Mg) MG tablet Commonly known as:  MAG-OX Take 0.5 tablets (200 mg total) by mouth daily.   Metoprolol Tartrate 75 MG Tabs Take 150 mg by mouth 2 (two) times daily. What changed:    medication  strength  how much to take   multivitamin tablet Take 1 tablet by mouth daily.   nitroGLYCERIN 0.4 MG SL tablet Commonly known as:  NITROSTAT Place 0.4 mg under the tongue every 5 (five) minutes as needed for chest pain. If no relief by 3rd tab, call 911   Omega 3 1000 MG Caps Take 1,000 mg by mouth 3 (three) times daily.   omeprazole 40 MG capsule Commonly known as:  PRILOSEC TAKE ONE CAPSULE BY MOUTH DAILY What changed:    how much to take  how to take this  when to take this   oxymorphone 5 MG tablet Commonly known as:  OPANA Take 5 mg by mouth every 6 (six) hours as needed for pain (max 3 tablets daily). Per Dr Brien Few   polyethylene glycol packet Commonly known as:  MIRALAX / GLYCOLAX Take 17 g by mouth daily.   potassium chloride SA 20 MEQ tablet Commonly known as:  K-DUR,KLOR-CON Take 1 tablet (20 mEq total) by mouth 2 (two) times daily.   rosuvastatin 20 MG tablet Commonly known as:  CRESTOR Take 1 tablet (20 mg total) by mouth daily.          Outstanding Labs/Studies   BMP in future Plan for ablation to be scheduled with Dr. Caryl Comes  Duration of Discharge Encounter   Greater than 30 minutes including physician time.  Signed, Cecilie Kicks NP 07/26/2017, 10:57 AM   I have seen and examined this patient with Cecilie Kicks.  Agree with above, note added to reflect my findings.  On exam, iRRR, no murmurs, lungs clear.  CRT-D implanted for atrial fibrillation and failure.  Plan for future AV nodal ablation.  We Anthony [name] plan to increase metoprolol today for further rate control.  Discharge today with follow-up in device clinic.  Anthony [name] M. Ruthy Forry MD 07/26/2017 11:04 AM

## 2017-07-28 ENCOUNTER — Other Ambulatory Visit: Payer: Self-pay | Admitting: *Deleted

## 2017-07-28 ENCOUNTER — Telehealth: Payer: Self-pay | Admitting: Internal Medicine

## 2017-07-28 ENCOUNTER — Encounter (HOSPITAL_COMMUNITY): Payer: Self-pay | Admitting: Internal Medicine

## 2017-07-28 ENCOUNTER — Encounter: Payer: Self-pay | Admitting: *Deleted

## 2017-07-28 MED FILL — Gentamicin Sulfate Inj 40 MG/ML: INTRAMUSCULAR | Qty: 80 | Status: AC

## 2017-07-28 NOTE — Telephone Encounter (Signed)
New message    Pt c/o medication issue:  1. Name of Medication:amiodarone (PACERONE) 200 MG tablet  2. How are you currently taking this medication (dosage and times per day)? Take 2 tablets (400 mg total) by mouth 2 (two) times daily. 2 tabs twice a day for 2 weeks, beginning 08/10/17 decrease to 1 tab twice a day for 2 weeks, then 08/24/17 begin 1 200 mg daily.  3. Are you having a reaction (difficulty breathing--STAT)? NO  4. What is your medication issue? Patient wants clarification on how to take medication.

## 2017-07-28 NOTE — Patient Outreach (Signed)
[facility]) Care Management  07/28/2017  DONOVIN LAMACCHIA 08-Jan-1951 [id number]    Transition of care (Initial d/c 3/2)  RN spoke with pt and introduced the [facility] services and purpose for today's call. Pt very receptive and RN able to complete the transition of care template and a major of the initial assessment. Discussed focused on pt's recent HF with defibrillator placement and his diabetes (recent hospitalization with DKA-seizure lead to a stroke). Pt states he need re-education on his diabetes however not limited to just his diabetes. Pt states he is also interested on information about his HF but will stay focused on his diabetes due to his recent A1C at 8.6 on 06/12/17. RN educated pt on HF and the HF zones and verified pt is in the GREEN now that he has his defibrillator. Pt states he remembers the symptoms and no long has those symptoms but will remain aware when to contact his provider. Discussed pt's diabetes in detail and verify pt's is aware of what to do if acute symptoms occur with hypo-hyperglycemia. Pt reports his CBG at 135 this morning and states he is aware of his diabetic medications. Discussed and generated a plan of care with goals and interventions pt is receptive to following to manage his diabetes. RN offered a one-on-one consultation in the home however pt declined but receptive to the ongoing telephone transition of care calls over the next several weeks. Reviewed all medication with some discrepancies on pt's Amirodarone and Losartan. Pt has called his provider concerning the Amiodarone and will follow up with his CAD provider on the correct dosage noted in Epic concerning his Losartan indicated 12.5 mg daily however pt taking his last instruction of 25 mg daily. RN has offered to assist further with calls however pt again indicated he has already call his primary provider on the Amiodarone. RN encouraged pt to call once again after 4 pm if they have not call back  as a reminder to follow up with confirmation on this very importance medications as pt is weaned with different doses every 2 weeks (pt verbalized an understanding).  RN will scheduled and follow up with ongoing transition of care calls next week. Pt has RN contact number if any concerns or if further assistance if needed. No other issues or request at this time. Will notify provider of pt's disposition with [facility] services.  Patient was recently discharged from hospital and all medications have been reviewed.  Raina Mina, RN Care Management Coordinator [place] Office [phone]

## 2017-07-28 NOTE — Telephone Encounter (Signed)
Called patient to clarify his DC instructions for Amiodarone. He agreed to begin taking the medication tomorrow 3/5. He verbalized understanding that he will be tapering his doses over a period of 4 weeks; starting at 400mg  bid x2wks; 200mg  bid x2wks; then 200mg  qd. We reviewed it together for some time and he verbalized he understood. I told him if he had any additional questions, to call back.

## 2017-07-28 NOTE — Op Note (Signed)
NAME:  Anthony [name], Anthony [name]                    ACCOUNT NO.:  MEDICAL RECORD NO.:  [id number]  LOCATION:                                 FACILITY:  PHYSICIAN:  Deboraha Sprang, MD, FACCDATE OF BIRTH:  Nov 08, 1950  DATE OF PROCEDURE:  07/25/2017 DATE OF DISCHARGE:                              OPERATIVE REPORT   PREOPERATIVE DIAGNOSIS:  Nonischemic cardiomyopathy, thought to be rate related in the setting of atrial fibrillation with a rapid ventricular response and anticipated atrioventricular junction ablation.  POSTOPERATIVE DIAGNOSIS:  Nonischemic cardiomyopathy, thought to be rate related in the setting of atrial fibrillation with a rapid ventricular response and anticipated atrioventricular junction ablation.  PROCEDURE:  Single-chamber defibrillator implantation with left ventricular lead placement.  DESCRIPTION OF PROCEDURE:  Following we obtained informed consent, the patient was brought to electrophysiology laboratory and placed on the fluoroscopic table in the supine position.  After routine prep and drape of the left upper chest, lidocaine was infiltrated in the prepectoral subclavicular region.  An incision was made and carried down to layer of the prepectoral fascia using electrocautery and sharp dissection.  A pocket was formed similarly, hemostasis was obtained.  Thereafter, attention was turned to gain access to the extrathoracic left subclavian vein, which was accomplished without difficulty without the aspiration of air or puncture of the artery.  Two separate venipunctures were accomplished.  Guidewires were placed and retained and sequentially a 9 and 9.5-French sheaths were placed through which were passed a Medtronic 6935 MRI conditional lead serial #[id number] and a St. Jude 135 CS cannulation sheath.  Under fluoroscopic guidance, the RV lead was manipulated to the apex where the bipolar R-wave was 12.1 with a pace impedance of 598, a threshold of 0.8 V at 0.5  milliseconds.  Current threshold was 0.8 mA and there was no diaphragmatic pacing at 10 V.  The current of injury was brisk.  Thereafter, CS was cannulated without significant difficulty.  Turned out to have quite a tortuous and free point, so we ended up using a double Wooley wire technique to deploy the sheath.  A venogram demonstrated no lateral branches except for a very proximal posterior branch.  Based on recent data regarding efficacy in the anterior veins, the anterior vein was targeted and its most lateral ramification was selected.  With a Whisper wire, a St. Jude 1458 lead, serial [id number] was deployed to a junction between the mid and distal third.  Pacing configurations were not ideal, but in the 2-coil configuration, the amplitude was 9 with a pace impedance of 483, a threshold of 1.8 V at 0.5 milliseconds.  There was no diaphragmatic pacing at 10 V.  With these acceptable parameters recorded, the deployment system was removed.  The lead was secured to the prepectoral fascia.  In this regard, it was noteworthy that the RV lead had previously been prepared.  The pocket was copiously irrigated with antibiotic containing saline solution.  The leads were attached to Medtronic MRI compatible device serial #[id number].  Through the device, the bipolar R-wave was 19.6 with a pace impedance of 475, a threshold of 0.5 V at 0.4 milliseconds.  The LV impedance was  456 with a threshold 1.75 V at 0.4 milliseconds.  The pocket was copiously irrigated with antibiotic containing saline solution.  Hemostasis was assured.  The leads in the pulse generator were placed in the pocket secured to the prepectoral fascia.  A plug had been placed in the atrial port.  The wound was then closed in 2 layers in normal fashion.  The wound was washed, dried, and a Dermabond dressing was applied.  Needle counts, sponge counts, and instrument counts were correct at the end of the procedure according to the  staff.  The patient tolerated the procedure without apparent complication.     Deboraha Sprang, MD, [facility]     SCK/MEDQ  D:  07/25/2017  T:  07/26/2017  Job:  [id number]

## 2017-07-30 ENCOUNTER — Other Ambulatory Visit: Payer: Self-pay | Admitting: *Deleted

## 2017-07-30 ENCOUNTER — Encounter: Payer: Self-pay | Admitting: Internal Medicine

## 2017-07-30 NOTE — Patient Outreach (Signed)
[facility]) Care Management  07/30/2017  MAHMUD KEITHLY 1950-09-04 [id number]  Care coordination  Received an in-basket message from the provider Dr. Lindell Noe that pt would benefit from a pharmacy consult via [facility]. Will place an order to intervention and assist pt accordingly.   Raina Mina, RN Care Management Coordinator [place] Office [phone]

## 2017-07-30 NOTE — H&P (View-Only) (Signed)
ICD Criteria  Current LVEF:25%. Within 12 months prior to implant: Yes   Heart failure history: Yes, Class III  Cardiomyopathy history: Yes, Mixed Ischemic and Non-Ischemic Cardiomyopathies.  Atrial Fibrillation/Atrial Flutter: Yes, Long standing (> 1 Year).  Ventricular tachycardia history: No.  Cardiac arrest history: No.  History of syndromes with risk of sudden death: No.  Previous ICD: No.  Current ICD indication: Primary  PPM indication: Yes. Pacing type: Ventricular. Greater than 40% RV pacing requirement anticipated. Indication: Complete Heart Block   Class I or II Bradycardia indication present: Yes  Beta Blocker therapy for 3 or more months: Yes, prescribed.   Ace Inhibitor/ARB therapy for 3 or more months: Yes, prescribed.

## 2017-07-30 NOTE — Progress Notes (Signed)
ICD Criteria  Current LVEF:25%. Within 12 months prior to implant: Yes   Heart failure history: Yes, Class III  Cardiomyopathy history: Yes, Mixed Ischemic and Non-Ischemic Cardiomyopathies.  Atrial Fibrillation/Atrial Flutter: Yes, Long standing (> 1 Year).  Ventricular tachycardia history: No.  Cardiac arrest history: No.  History of syndromes with risk of sudden death: No.  Previous ICD: No.  Current ICD indication: Primary  PPM indication: Yes. Pacing type: Ventricular. Greater than 40% RV pacing requirement anticipated. Indication: Complete Heart Block   Class I or II Bradycardia indication present: Yes  Beta Blocker therapy for 3 or more months: Yes, prescribed.   Ace Inhibitor/ARB therapy for 3 or more months: Yes, prescribed.

## 2017-08-04 ENCOUNTER — Other Ambulatory Visit: Payer: Self-pay | Admitting: *Deleted

## 2017-08-04 NOTE — Patient Outreach (Signed)
[facility]) Care Management  08/04/2017  Anthony [name] 1950/10/26 [id number]    Transition of care  RN spoke with pt and reviewed plan of care with goals and interventions. Adjusted according to pt's progress. Based upon follow up with two medication pt has a better understanding on his Amiodarone and indicates he did speak with his cardiologist office and was informed to take 25 mg of the Losartan however Epic indicated take 1/2 this dose due to pt's kidney function. RN will contact [name]) the involved pharmacy to intervene further on this medication issues. Pt is aware and will continue the current dosage of 25 mg until instructed by his provider to take differently.  Pt has not been taking his CBG on a regular bases and does not have any AM readings. RN strongly encouraged pt to adhere to his daily accucheck in order to matter manage his diabetes. Stress the importance and risk involved if he does not monitor this medical condition. Discussed his recent seizure activity which lead to a stroke related to his elevated CBGs. Pt verbalized an understanding and will attempt to adherence to the plan of care and goals discussed today. RN offered home visit to intervene further with engagement however pt wishes to continue ongoing transition of care. Will follow up next week with ongoing transition of care and again alert [place] to engage on pharmacy needs and request an update on resolving this problem. Pt appreciative and grateful for the call and assistance today.  Raina Mina, RN Care Management Coordinator [place] Office [phone]

## 2017-08-04 NOTE — Patient Outreach (Signed)
[facility]) Care Management  08/04/2017  Anthony [name] 05-Feb-1951 [id number]    Transition of care  RN attempted outreach call unsuccessful but RN able to leave a HIPAA approved voice message requesting a call back. Will reschedule another call for tomorrow for ongoing transition of care disease management services.   Raina Mina, RN Care Management Coordinator [place] Office [phone]

## 2017-08-05 ENCOUNTER — Other Ambulatory Visit: Payer: Self-pay | Admitting: Family Medicine

## 2017-08-05 ENCOUNTER — Ambulatory Visit: Payer: Self-pay | Admitting: *Deleted

## 2017-08-07 ENCOUNTER — Ambulatory Visit (INDEPENDENT_AMBULATORY_CARE_PROVIDER_SITE_OTHER): Payer: Medicare Other | Admitting: *Deleted

## 2017-08-07 ENCOUNTER — Telehealth: Payer: Self-pay

## 2017-08-07 ENCOUNTER — Ambulatory Visit
Admission: RE | Admit: 2017-08-07 | Discharge: 2017-08-07 | Disposition: A | Discharge status: Home / Self Care | Payer: Medicare Other | Source: Ambulatory Visit | Attending: Internal Medicine | Admitting: Internal Medicine

## 2017-08-07 DIAGNOSIS — I428 Other cardiomyopathies: Secondary | ICD-10-CM

## 2017-08-07 DIAGNOSIS — Z95810 Presence of automatic (implantable) cardiac defibrillator: Secondary | ICD-10-CM

## 2017-08-07 LAB — CUP PACEART INCLINIC DEVICE CHECK
Battery Remaining Longevity: 84 mo
Battery Voltage: 3.02 V
Brady Statistic AP VP Percent: 0 %
Brady Statistic AP VS Percent: 0 %
Brady Statistic AS VP Percent: 0 %
Brady Statistic AS VS Percent: 0 %
Brady Statistic RA Percent Paced: 0 %
Brady Statistic RV Percent Paced: 56.15 %
Date Time Interrogation Session: 20190314145334
HighPow Impedance: 68 Ohm
Implantable Lead Implant Date: 20190301
Implantable Lead Implant Date: 20190301
Implantable Lead Location: 753858
Implantable Lead Location: 753860
Implantable Pulse Generator Implant Date: 20190301
Lead Channel Impedance Value: 1121 Ohm
Lead Channel Impedance Value: 1140 Ohm
Lead Channel Impedance Value: 1235 Ohm
Lead Channel Impedance Value: 1311 Ohm
Lead Channel Impedance Value: 1349 Ohm
Lead Channel Impedance Value: 1349 Ohm
Lead Channel Impedance Value: 336.648
Lead Channel Impedance Value: 360.753
Lead Channel Impedance Value: 365.195
Lead Channel Impedance Value: 377.863
Lead Channel Impedance Value: 393.735
Lead Channel Impedance Value: 4047 Ohm
Lead Channel Impedance Value: 418 Ohm
Lead Channel Impedance Value: 513 Ohm
Lead Channel Impedance Value: 646 Ohm
Lead Channel Impedance Value: 703 Ohm
Lead Channel Impedance Value: 760 Ohm
Lead Channel Impedance Value: 817 Ohm
Lead Channel Pacing Threshold Amplitude: 0.5 V
Lead Channel Pacing Threshold Amplitude: 3 V
Lead Channel Pacing Threshold Pulse Width: 0.4 ms
Lead Channel Pacing Threshold Pulse Width: 0.4 ms
Lead Channel Sensing Intrinsic Amplitude: 26.5 mV
Lead Channel Sensing Intrinsic Amplitude: 29.5 mV
Lead Channel Setting Pacing Amplitude: 3.5 V
Lead Channel Setting Pacing Amplitude: 4 V
Lead Channel Setting Pacing Pulse Width: 0.4 ms
Lead Channel Setting Pacing Pulse Width: 0.4 ms
Lead Channel Setting Sensing Sensitivity: 0.3 mV

## 2017-08-07 NOTE — Progress Notes (Signed)
Wound check appointment. Dermabond removed. Wound without redness or edema. Incision edges approximated, wound well healed. Normal device function. RV Threshold, sensing, and impedances consistent with implant measurements. LV threshold noted to be 3.00V@0 .38ms and 2.25V@0 .11ms. Per SK pt to obtain a chest x ray to verify lead placement.  Effective Bi-Vp 47.8%. Device programmed with adaptive on for extra safety margin until 3 month visit. Histogram distribution appropriate for patient and level of activity. No ventricular arrhythmias noted. Patient educated about wound care, arm mobility, lifting restrictions, shock plan. Plan for AV node ablation.

## 2017-08-07 NOTE — Telephone Encounter (Signed)
Spoke with pt regarding Dr. Olin Pia recommendations to get chest x ray to check lead placement, pt voiced understanding and agreeable to go to [facility] to obtain chest x ray. Instructions given to pt on how to get to [place] center

## 2017-08-08 ENCOUNTER — Other Ambulatory Visit: Payer: Medicare Other

## 2017-08-08 ENCOUNTER — Telehealth: Payer: Self-pay

## 2017-08-08 DIAGNOSIS — Z01818 Encounter for other preprocedural examination: Secondary | ICD-10-CM

## 2017-08-08 LAB — CBC WITH DIFFERENTIAL/PLATELET
Basophils Absolute: 0 10*3/uL (ref 0.0–0.2)
Basos: 0 %
EOS (ABSOLUTE): 0.3 10*3/uL (ref 0.0–0.4)
Eos: 3 %
Hematocrit: 42.4 % (ref 37.5–51.0)
Hemoglobin: 14.2 g/dL (ref 13.0–17.7)
Immature Grans (Abs): 0 10*3/uL (ref 0.0–0.1)
Immature Granulocytes: 0 %
Lymphocytes Absolute: 2.4 10*3/uL (ref 0.7–3.1)
Lymphs: 29 %
MCH: 29.6 pg (ref 26.6–33.0)
MCHC: 33.5 g/dL (ref 31.5–35.7)
MCV: 89 fL (ref 79–97)
Monocytes Absolute: 0.8 10*3/uL (ref 0.1–0.9)
Monocytes: 10 %
Neutrophils Absolute: 4.6 10*3/uL (ref 1.4–7.0)
Neutrophils: 58 %
Platelets: 139 10*3/uL — ABNORMAL LOW (ref 150–379)
RBC: 4.79 x10E6/uL (ref 4.14–5.80)
RDW: 14.9 % (ref 12.3–15.4)
WBC: 8.1 10*3/uL (ref 3.4–10.8)

## 2017-08-08 LAB — BASIC METABOLIC PANEL
BUN/Creatinine Ratio: 7 — ABNORMAL LOW (ref 10–24)
BUN: 18 mg/dL (ref 8–27)
CO2: 25 mmol/L (ref 20–29)
Calcium: 8.8 mg/dL (ref 8.6–10.2)
Chloride: 100 mmol/L (ref 96–106)
Creatinine, Ser: 2.45 mg/dL — ABNORMAL HIGH (ref 0.76–1.27)
GFR calc Af Amer: 31 mL/min/{1.73_m2} — ABNORMAL LOW (ref >59)
GFR calc non Af Amer: 26 mL/min/{1.73_m2} — ABNORMAL LOW (ref >59)
Glucose: 218 mg/dL — ABNORMAL HIGH (ref 65–99)
Potassium: 4.2 mmol/L (ref 3.5–5.2)
Sodium: 138 mmol/L (ref 134–144)

## 2017-08-08 NOTE — Telephone Encounter (Signed)
Spoke with pt informed him that chest x-ray was normal, pt voiced understanding

## 2017-08-08 NOTE — Addendum Note (Signed)
Addended by: Dollene Primrose on: 08/08/2017 11:48 AM   Modules accepted: Orders

## 2017-08-08 NOTE — Telephone Encounter (Signed)
-----   Message from Deboraha Sprang, MD sent at 08/08/2017  8:35 AM EDT ----- Please Inform Patient  Thanks CXR shows stable lead position

## 2017-08-11 ENCOUNTER — Other Ambulatory Visit: Payer: Self-pay | Admitting: Family Medicine

## 2017-08-11 ENCOUNTER — Other Ambulatory Visit: Payer: Self-pay | Admitting: *Deleted

## 2017-08-11 NOTE — Patient Outreach (Signed)
[facility] [facility] Care Management  08/11/2017  Anthony [name] 11-12-1950 [id number]    Transition of care  RN spoke with pt today and received an update on his ongoing care. Pt reports BS last week of 98 and on Thursday at 257 indicating he had "peanut butter crackers" along with something else to eat at 2 AM. Pt reports his BS was taken at 8 AM with the read of 257 however he averages under 100-115 on a good day. Pt reports he will have his A1C repeated on April at his provider's office and hopes it is lower then before. Pt states he is taking all his recommended medications and attending all his appointments with no problems. RN discussed pt's plan of care with goal and interventions adjusted accordingly to accommodates pt's ongoing management of care. Discussed different type of snacks and informed pt to pay attention to the serving size especially if they are peanut butter crackers already packaged. No other issues to address as pt feels these are reasonable and obtainable as he continue to manage his diabetes. RN continue to offer home visits if ne felt he would benefit from home visits however pt feels the ongoing transition of care calls are good and wants to continue participation with [facility] services. RN will scheduled and follow up next week with pt's progress.   Raina Mina, RN Care Management Coordinator [place] Office [phone]

## 2017-08-12 ENCOUNTER — Other Ambulatory Visit: Payer: Self-pay | Admitting: Pharmacist

## 2017-08-12 NOTE — Patient Outreach (Signed)
[facility] Care Management  08/12/2017  Anthony [name] 1951/01/22 [id number]  Patient was referred to [place] Pharmacist by [facility] RN Lattie Haw for medication confusion per a referral from his PCP.    Successful phone outreach to patient---HIPAA details verified and purpose of call explained to patient.   Patient reports he was not by his medications.   Offered to schedule a call back at a more convenient time and he agreed.   Plan:  Will make outreach attempt to patient as scheduled.   Karrie Meres, PharmD, [place] [phone]

## 2017-08-13 ENCOUNTER — Other Ambulatory Visit: Payer: Self-pay | Admitting: Pharmacist

## 2017-08-13 NOTE — Patient Outreach (Signed)
[place] [facility]) Care Management  [place]   08/13/2017  ALEXES LANNIN 1951-04-25 [id number]  Subjective:  Patient was referred to [place] Pharmacist by [facility] RN Lattie Haw for medication review.    Of note, patient's PCP also referred patient to [place] Pharmacist for potential in home pharmacy assessment.   Initial call to patient was not answered, HIPAA compliant message left and patient returned call.  HIPAA details verified, explained purpose of call and offered to complete medication review with patient in person---he declined and preferred via phone.    Patient also declined [facility] RN home visit per 07/28/17 note.    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     Objective:   Current Medications: Current Outpatient Medications  Medication Sig Dispense Refill  . acetaminophen (TYLENOL) 325 MG tablet Take 1-2 tablets (325-650 mg total) by mouth every 4 (four) hours as needed for mild pain. (Patient taking differently: Take 650 mg by mouth every 4 (four) hours as needed for mild pain. )    . amiodarone (PACERONE) 200 MG tablet Take 2 tablets (400 mg total) by mouth 2 (two) times daily. 2 tabs twice a day for 2 weeks, beginning 08/10/17 decrease to 1 tab twice a day for 2 weeks, then  08/24/17 begin 1 200 mg daily. (Patient taking differently: Take 200-400 mg by mouth See admin instructions. Take 400 mg by mouth twice a day for 2 weeks, beginning 08/10/17 decrease to 200 mg by mouth twice a day for 2 weeks, then  08/24/17 begin 200 mg by mouth daily.) 80 tablet 6  . apixaban (ELIQUIS) 5 MG TABS tablet Take 1 tablet (5 mg total) by mouth 2 (two) times daily. 60 tablet 11  . calcitRIOL (ROCALTROL) 0.25 MCG capsule TAKE 0.25 MG BY MOUTH daily  5  .  FLUoxetine (PROZAC) 20 MG capsule Take 1 capsule (20 mg total) by mouth daily. (Patient not taking: Reported on 08/13/2017) 30 capsule 0  . furosemide (LASIX) 40 MG tablet Take 1 tablet (40 mg total) by mouth 2 (two) times daily. (Patient taking differently: Take 40-80 mg by mouth See admin instructions. Take 80 mg by mouth in the morning and take 40 mg by mouth in the afternoon) 180 tablet 3  . gabapentin (NEURONTIN) 300 MG capsule TAKE ONE CAPSULE BY MOUTH AT BEDTIME (Patient taking differently: TAKE 300 MG BY MOUTH AT BEDTIME) 90 capsule 0  . hydrALAZINE (APRESOLINE) 25 MG tablet Take 1 tablet (25 mg total) by mouth every 4 (four) hours as needed (Give SBP >160 or DBP >110). (Patient not taking: Reported on 08/13/2017) 100 tablet 0  . Insulin Isophane & Regular Human (HUMULIN 70/30 KWIKPEN) (70-30) 100 UNIT/ML PEN Inject 55 Units into the skin daily. 15 mL 1  . losartan (COZAAR) 25 MG tablet Take 1 tablet (25 mg total) by mouth daily. 90 tablet 3  . magnesium oxide (MAG-OX) 400 (241.3 Mg) MG tablet Take 0.5 tablets (200 mg total) by mouth daily. (Patient taking differently: Take 400 mg by mouth daily. ) 30 tablet 0  . metoprolol succinate (TOPROL-XL) 50 MG 24 hr tablet Take 75 mg by mouth 2 (two) times daily. Take with or immediately following a meal.    . Multiple Vitamin (MULTIVITAMIN) tablet Take 1 tablet by mouth daily.      . nitroGLYCERIN (NITROSTAT)  0.4 MG SL tablet Place 0.4 mg under the tongue every 5 (five) minutes as needed for chest pain. If no relief by 3rd tab, call 911     . Omega 3 1000 MG CAPS Take 2,000 mg by mouth daily.     Marland Kitchen omeprazole (PRILOSEC) 40 MG capsule TAKE ONE CAPSULE BY MOUTH DAILY (Patient taking differently: TAKE 40 MG BY MOUTH DAILY) 30 capsule 0  . oxymorphone (OPANA) 5 MG tablet Take 5 mg by mouth every 6 (six) hours as needed for pain (max 3 tablets daily). Per Dr Brien Few    . PARoxetine (PAXIL) 40 MG tablet Take 40 mg by mouth daily.    . polyethylene glycol  (MIRALAX / GLYCOLAX) packet Take 17 g by mouth daily. (Patient taking differently: Take 17 g by mouth daily as needed for moderate constipation. ) 14 each 0  . potassium chloride SA (K-DUR,KLOR-CON) 20 MEQ tablet Take 1 tablet (20 mEq total) by mouth 2 (two) times daily. 180 tablet 3  . rosuvastatin (CRESTOR) 20 MG tablet Take 1 tablet (20 mg total) by mouth daily. (Patient taking differently: Take 5 mg by mouth daily. ) 30 tablet 0   No current facility-administered medications for this visit.     Functional Status: In your present state of health, do you have any difficulty performing the following activities: 08/11/2017 07/28/2017  Hearing? N -  Vision? N -  Difficulty concentrating or making decisions? N -  Walking or climbing stairs? N -  Dressing or bathing? N -  Doing errands, shopping? Y -  Conservation officer, nature and eating ? N N  Using the Toilet? N N  In the past six months, have you accidently leaked urine? N N  Do you have problems with loss of bowel control? N N  Managing your Medications? N N  Managing your Finances? N N  Housekeeping or managing your Housekeeping? N N  Some recent data might be hidden    Fall/Depression Screening: Fall Risk  07/28/2017 02/12/2017 01/17/2017  Falls in the past year? Yes No No  Number falls in past yr: 1 - -  Injury with Fall? No - -  Risk for fall due to : - - -  Risk for fall due to: Comment - - -  Follow up Falls prevention discussed - -  Comment pt had seizure-stroke - -   PHQ 2/9 Scores 07/28/2017 07/24/2017 02/12/2017 01/17/2017 11/07/2016 09/04/2016 08/26/2016  PHQ - 2 Score 0 0 0 0 0 0 -  PHQ- 9 Score - - - - - - -  Exception Documentation - - - - - - Other- indicate reason in comment box  Not completed - - - - - - currently on Paxil    Assessment:  Medication review per patient report and review of medication list in chart.    Drugs sorted by system:  Neurologic/Psychologic: -fluoxetine---patient reports he is not taking  -gabapentin   -paroxetine 40 mg daily  Cardiovascular: -amiodarone---patient confirmed taper directions per 07/28/17 cardiology note  -apixaban  -furosemide---patient reports he is taking 80 mg in am and 40 mg in pm  -hydralazine---patient reports he does not have  -losartan  -metoprolol succinate---patient reports using 50 mg---75 mg twice daily  -nitroglycerin sublingual as needed  -potassium chloride  -rosuvastatin---patient reports taking 5 mg daily   Gastrointestinal: -omeprazole  -Miralax  Endocrine: -Humulin 70/30 insulin pen   Renal: -calcitriol   Pain: -acetaminophen prn  -oxymorphone IR as needed   Vitamins/Minerals: -magnesium  -multivitamin  -  omega 3 fatty acid   Other issues noted:   1) Rosuvastatin:  Patient reports taking 5 mg daily, med list indicated 20 mg daily---appears to have been increased following 06/19/17 discharge  2) fluoxetine---noted to be started following 06/19/17 discharge in place of paroxetine due to side effect profile---patient reports he continued paroxetine and did not start fluoxetine.     3) Furosemide:  Patient reports 80 mg in am and 40 mg in pm, med list indicated 40 mg twice daily   4) Metoprolol:  Patient reports taking metoprolol succinate 50 mg---75 mg twice daily---07/26/17 cardiology discharge summary notes metoprolol tartrate 75 mg---150 mg twice daily   5) Suggest evaluation of his renal function with dose of losartan and furosemide---last SCr 08/08/17 increased to 2.45  Plan:  Will route note to Dr Olin Pia for furosemide and metoprolol clarification.  Will route note to PCP for clarification on rosuvastatin, SSRI, and renal function.    Will place follow-up call to patient in the next 2 weeks.    Karrie Meres, PharmD, [place] [phone]

## 2017-08-18 ENCOUNTER — Other Ambulatory Visit: Payer: Self-pay | Admitting: *Deleted

## 2017-08-18 ENCOUNTER — Telehealth: Payer: Self-pay

## 2017-08-18 DIAGNOSIS — Z79899 Other long term (current) drug therapy: Secondary | ICD-10-CM

## 2017-08-18 NOTE — Telephone Encounter (Signed)
Pt verbalized understanding of Dr Olin Pia recommendation to stop Losartan and repeat a BMP on April 8th. He stated his urine was dark at times and I advised him to speak with Dr Caryl Comes about that when he sees him on Wed March 27th for his AVN ablation. He verbalized understanding and had no additional questions.

## 2017-08-18 NOTE — Telephone Encounter (Signed)
-----   [name], MD sent at 08/15/2017  5:24 PM EDT ----- Please Inform Patient that labs are normal x worsening renal function Please stop losartan  And recehck in 3 weeks  Thanks

## 2017-08-18 NOTE — Patient Outreach (Signed)
[facility]) Care Management  08/18/2017  DEVEAN SKOCZYLAS 08/21/1950 [id number]   Transition of care  RN spoke with pt and received an update on his ongoing managing of care. Pt reports he has missed a few daily CBGs (pt verified once daily) but the last readings was 150. Pt states he has been attending all medical appointments and following all recommendations from his providers. States one of his medications Losartan has been discontinued today and he is pending an appointment on Wednesday with his cardiologist. Pt states no swelling and he continue to do well with no acute events or issues. RN offer any needed resources however pt continue to do well with no needs reported at this time. Note pharmacy concerning to document on this pt as RN will update [facility] pharmacy accordingly with no potential needs. Plan of care discussed with strongly encouraged on daily CBG for ongoing management of care related to his diabetes. RN discussed the risk involved if this condition is not monitor closely. Pt with understanding and indicated he would try to improve. Pt aware that RN is able to visit for a more one-on-one consultation however prefers ongoing transition of care call instead.. Will scheduled the next call for next week.  Raina Mina, RN Care Management Coordinator [place] Office [phone]

## 2017-08-20 ENCOUNTER — Encounter (HOSPITAL_COMMUNITY)
Admission: RE | Disposition: A | Discharge status: Home / Self Care | Payer: Self-pay | Source: Ambulatory Visit | Attending: Internal Medicine

## 2017-08-20 ENCOUNTER — Ambulatory Visit (HOSPITAL_COMMUNITY)
Admission: RE | Admit: 2017-08-20 | Discharge: 2017-08-20 | Disposition: A | Discharge status: Home / Self Care | Payer: Medicare Other | Source: Ambulatory Visit | Attending: Internal Medicine | Admitting: Internal Medicine

## 2017-08-20 DIAGNOSIS — Z79899 Other long term (current) drug therapy: Secondary | ICD-10-CM | POA: Insufficient documentation

## 2017-08-20 DIAGNOSIS — I428 Other cardiomyopathies: Secondary | ICD-10-CM | POA: Diagnosis not present

## 2017-08-20 DIAGNOSIS — I442 Atrioventricular block, complete: Secondary | ICD-10-CM | POA: Insufficient documentation

## 2017-08-20 DIAGNOSIS — I10 Essential (primary) hypertension: Secondary | ICD-10-CM | POA: Insufficient documentation

## 2017-08-20 DIAGNOSIS — I4891 Unspecified atrial fibrillation: Secondary | ICD-10-CM | POA: Diagnosis not present

## 2017-08-20 HISTORY — PX: AV NODE ABLATION: EP1193

## 2017-08-20 LAB — GLUCOSE, CAPILLARY
Glucose-Capillary: 203 mg/dL — ABNORMAL HIGH (ref 65–99)
Glucose-Capillary: 221 mg/dL — ABNORMAL HIGH (ref 65–99)

## 2017-08-20 SURGERY — AV NODE ABLATION

## 2017-08-20 MED ORDER — ONDANSETRON HCL 4 MG/2ML IJ SOLN: 4.0000 mg | Freq: Four times a day (QID) | INTRAMUSCULAR | Status: DC | PRN

## 2017-08-20 MED ORDER — SODIUM CHLORIDE 0.9 % IV SOLN: 250.0000 mL | INTRAVENOUS | Status: DC | PRN

## 2017-08-20 MED ORDER — LABETALOL HCL 5 MG/ML IV SOLN
INTRAVENOUS | Status: AC
  Administered 2017-08-20: 20 mg via INTRAVENOUS
  Filled 2017-08-20: qty 4

## 2017-08-20 MED ORDER — SODIUM CHLORIDE 0.9% FLUSH: 3.0000 mL | INTRAVENOUS | Status: DC | PRN

## 2017-08-20 MED ORDER — LOSARTAN POTASSIUM 25 MG PO TABS
25.0000 mg | ORAL_TABLET | Freq: Every day | ORAL | Status: DC
  Administered 2017-08-20: 25 mg via ORAL
  Filled 2017-08-20: qty 1

## 2017-08-20 MED ORDER — BUPIVACAINE HCL (PF) 0.25 % IJ SOLN
INTRAMUSCULAR | Status: AC
  Filled 2017-08-20: qty 60

## 2017-08-20 MED ORDER — HEPARIN (PORCINE) IN NACL 2-0.9 UNIT/ML-% IJ SOLN
INTRAMUSCULAR | Status: AC | PRN
  Administered 2017-08-20: 500 mL

## 2017-08-20 MED ORDER — LABETALOL HCL 5 MG/ML IV SOLN
20.0000 mg | Freq: Once | INTRAVENOUS | Status: AC
  Administered 2017-08-20: 20 mg via INTRAVENOUS

## 2017-08-20 MED ORDER — FENTANYL CITRATE (PF) 100 MCG/2ML IJ SOLN
INTRAMUSCULAR | Status: DC | PRN
  Administered 2017-08-20: 50 ug via INTRAVENOUS

## 2017-08-20 MED ORDER — CLONIDINE HCL 0.1 MG PO TABS
0.1000 mg | ORAL_TABLET | Freq: Once | ORAL | Status: AC
  Administered 2017-08-20: 0.1 mg via ORAL
  Filled 2017-08-20 (×2): qty 1

## 2017-08-20 MED ORDER — CLONIDINE HCL 0.1 MG PO TABS
0.1000 mg | ORAL_TABLET | ORAL | Status: DC | PRN
  Administered 2017-08-20: 0.1 mg via ORAL
  Filled 2017-08-20 (×2): qty 1

## 2017-08-20 MED ORDER — SODIUM CHLORIDE 0.9 % IV SOLN
INTRAVENOUS | Status: DC
  Administered 2017-08-20: 10:00:00 via INTRAVENOUS

## 2017-08-20 MED ORDER — HEPARIN (PORCINE) IN NACL 2-0.9 UNIT/ML-% IJ SOLN
INTRAMUSCULAR | Status: AC
  Filled 2017-08-20: qty 500

## 2017-08-20 MED ORDER — LABETALOL HCL 5 MG/ML IV SOLN
40.0000 mg | Freq: Once | INTRAVENOUS | Status: AC
  Administered 2017-08-20: 20 mg via INTRAVENOUS

## 2017-08-20 MED ORDER — MIDAZOLAM HCL 5 MG/5ML IJ SOLN
INTRAMUSCULAR | Status: AC
  Filled 2017-08-20: qty 5

## 2017-08-20 MED ORDER — SODIUM CHLORIDE 0.9% FLUSH: 3.0000 mL | Freq: Two times a day (BID) | INTRAVENOUS | Status: DC

## 2017-08-20 MED ORDER — SODIUM CHLORIDE 0.9 % IV SOLN: INTRAVENOUS | Status: DC

## 2017-08-20 MED ORDER — ACETAMINOPHEN 325 MG PO TABS: 650.0000 mg | ORAL_TABLET | ORAL | Status: DC | PRN

## 2017-08-20 MED ORDER — HYDRALAZINE HCL 25 MG PO TABS: 25.0000 mg | ORAL_TABLET | Freq: Three times a day (TID) | ORAL | 0 refills | Status: DC

## 2017-08-20 MED ORDER — FENTANYL CITRATE (PF) 100 MCG/2ML IJ SOLN
INTRAMUSCULAR | Status: AC
  Filled 2017-08-20: qty 2

## 2017-08-20 MED ORDER — SODIUM CHLORIDE 0.45 % IV SOLN: INTRAVENOUS | Status: DC

## 2017-08-20 MED ORDER — LABETALOL HCL 5 MG/ML IV SOLN
20.0000 mg | Freq: Once | INTRAVENOUS | Status: AC
  Administered 2017-08-20: 20 mg via INTRAVENOUS
  Filled 2017-08-20: qty 4

## 2017-08-20 MED ORDER — LABETALOL HCL 200 MG PO TABS: 400.0000 mg | ORAL_TABLET | Freq: Two times a day (BID) | ORAL | 1 refills | Status: DC

## 2017-08-20 MED ORDER — BUPIVACAINE HCL (PF) 0.25 % IJ SOLN
INTRAMUSCULAR | Status: DC | PRN
  Administered 2017-08-20: 20 mL

## 2017-08-20 MED ORDER — MIDAZOLAM HCL 5 MG/5ML IJ SOLN
INTRAMUSCULAR | Status: DC | PRN
  Administered 2017-08-20: 2 mg via INTRAVENOUS

## 2017-08-20 SURGICAL SUPPLY — 8 items
BAG SNAP BAND KOVER 36X36 (MISCELLANEOUS) ×1 IMPLANT
CATH BLAZER 5MM LG 8F 5086TMK2 (ABLATOR) ×1 IMPLANT
INTRODUCER SWARTZ SL2 8F (SHEATH) ×1 IMPLANT
INTRODUCER SWARTZ SRO 8F (SHEATH) ×1 IMPLANT
PACK EP LATEX FREE (CUSTOM PROCEDURE TRAY) ×2
PACK EP LF (CUSTOM PROCEDURE TRAY) IMPLANT
PAD DEFIB LIFELINK (PAD) ×1 IMPLANT
SHEATH AVANTI 11CM 8FR (SHEATH) ×1 IMPLANT

## 2017-08-20 NOTE — Progress Notes (Signed)
Dr. Caryl Comes in to see pt. Informed of elevated blood pressure and last eliquis dose. No new orders received.

## 2017-08-20 NOTE — Interval H&P Note (Signed)
History and Physical Interval Note:  08/20/2017 10:32 AM  Anthony [name]  has presented today for surgery, with the diagnosis of av  The various methods of treatment have been discussed with the patient and family. After consideration of risks, benefits and other options for treatment, the patient has consented to  Procedure(s): AV NODE ABLATION (N/A) as a surgical intervention .  The patient's history has been reviewed, patient examined, no change in status, stable for surgery.  I have reviewed the patient's chart and labs.  Questions were answered to the patient's satisfaction.     Virl Axe  For av ablation

## 2017-08-20 NOTE — Progress Notes (Signed)
Site area: Right groin a 8 french venous sheath was removed  Site Prior to Removal:  Level 0  Pressure Applied For 25 MINUTES    Bedrest Beginning at 1350p  Manual:   Yes.    Patient Status During Pull:  stable  Post Pull Groin Site:  Level 0  Post Pull Instructions Given:  Yes.    Post Pull Pulses Present:  Yes.    Dressing Applied:  Yes.    Comments:  VS remain stable

## 2017-08-20 NOTE — Progress Notes (Signed)
D/w Short stay nurse - Patient is s/p ablation and has had refractory hypertension. Dr. Caryl Comes adjusted his BP meds and felt he could be discharged. I was called as the on call physician regarding concerns of persistent hypertension - he is asymptomatic. IV and short-acting PO meds have not been helpful. Labetalol 400 mg BID was started.  Dr. Caryl Comes recommended outpatient work-up of refractory hypertension with his PCP.   Pixie Casino, MD, [facility], [place] Director of the Advanced Lipid Disorders &  Cardiovascular Risk Reduction Clinic Diplomate of the American Board of Clinical Lipidology Attending Cardiologist  Direct Dial: [phone]  Fax: [phone]  Website:  [email or web address]

## 2017-08-20 NOTE — Progress Notes (Signed)
Pt was given home BP meds (see MAR) with very little improvement.  BP remains >329 diastolic Dr Caryl Comes notified

## 2017-08-20 NOTE — Discharge Instructions (Signed)

## 2017-08-20 NOTE — Progress Notes (Signed)
New discharge orders entered per Dr Olin Pia recommendations  1- stop amiodarone 2- increase hydralazine to 25mg  q8 hours 3- discontinue metoprolol, start labetalol 400mg  bid   Chanetta Marshall, NP 08/20/2017 2:06 PM

## 2017-08-20 NOTE — Progress Notes (Signed)
HTN continues, spoke with Kerin Ransom Pa regarding bp issue, he discussed this with Dr Debara Pickett cardiology and he called me and we reviewed BP events with medications that have been given. Pt will be DC as scheduled,

## 2017-08-20 NOTE — Progress Notes (Addendum)
Hand off Report was received, during assessment groin was noted to have a saturated gauze dressing that was new. BP remains elevated. Dr Caryl Comes was paged. . [id number] page was returned, orders for bp meds given and pt to f/u with pcp, pt to be dc'd as scheduled.

## 2017-08-21 ENCOUNTER — Encounter (HOSPITAL_COMMUNITY): Payer: Self-pay | Admitting: Internal Medicine

## 2017-08-21 ENCOUNTER — Other Ambulatory Visit: Payer: Self-pay | Admitting: Family Medicine

## 2017-08-22 ENCOUNTER — Other Ambulatory Visit: Payer: Self-pay | Admitting: *Deleted

## 2017-08-22 ENCOUNTER — Other Ambulatory Visit: Payer: Self-pay | Admitting: Pharmacist

## 2017-08-22 NOTE — Patient Outreach (Signed)
[facility]) Care Management  08/22/2017  [name] [birth year] [id number]  Patient returned call to [facility] Pharmacist.  HIPAA details verified.    He reports he did have medication changes following his ablation on 08/20/17.    Patient reports he stopped amiodarone and metoprolol.  He confirms he started labetalol and hydralazine.    Patient reports he had stopped losartan but resumed it following his procedure because his after visit medication list contained losartan as active.   Patient reports he restarted it 08/21/17.    08/18/17 telephone note from cardiology indicated losartan was stopped secondary to renal function with lab re-check 09/01/17.    Patient confirms he has appointment with family medicine clinic on 08/28/17.   Plan:  Message sent to Dr Caryl Comes to request clarification on losartan.    Will get in touch with PCP office regarding needed clarification on rosuvastatin and paroxetine from 08/13/17 note as no reply was received from PCP office.    Karrie Meres, PharmD, [place] [phone]

## 2017-08-22 NOTE — Patient Outreach (Signed)
[place] [facility]) Care Management  08/22/2017  RASHOD GOUGEON 03-Oct-1950 [id number]  Unsuccessful phone outreach to patient to follow-up on his pharmacy needs.    Noted per chart review, patient has had multiple medication changes since last [facility] Pharmacist phone call.   HIPAA compliant message left requesting return call.   Plan:  Will make second outreach attempt next week.   Karrie Meres, PharmD, [place] [phone]

## 2017-08-22 NOTE — Patient Outreach (Signed)
[facility]) Care Management  08/22/2017  Anthony [name] August 13, 1950 [id number]   Follow up on an ED with a 10 hours admit for a procedure Ablation (cat lab).  RN spoke with pt today after his procedure on yesterday for an Ablation. Pt states he is doing well with no problems. States several changes in his medications but Anthony [name] is working with him on those issues. RN inquired on his diabetes as pt admits no CBG readings over the next two days but was taking prior to his procedure with a reading of 203. RN again stress the importance of taking his CBG in managing his diabetes and preventing acute symptoms from occurring. Discussed again his plan of care along with interventions related to his diabetes and recent procedure. RN offered to review his discharge instructions but pt did not have this information. RN extended once again community home visits however pt remains receptive to the ongoing transition of care contacts. Will follow next week for a transition of care call.  Raina Mina, RN Care Management Coordinator [place] Office [phone]

## 2017-08-26 ENCOUNTER — Telehealth: Payer: Self-pay

## 2017-08-26 ENCOUNTER — Other Ambulatory Visit: Payer: Self-pay | Admitting: *Deleted

## 2017-08-26 NOTE — Patient Outreach (Signed)
[facility]) Care Management  08/26/2017  Anthony [name] 07-24-1950 [id number]    Transition of care (Successful)  Rn spoke with pt today and inquired on his ongoing management of care related to his diabetes. Pt reports his last two morning reads 135 and 140 much improved from last week's conversation. RN praised pt for his efforts in trying to reduce his CBG under 150 and continue to encourage adherence with this process. Plan of care discussed with goals and interventions adjusted accordingly. Pt also indicated his lower legs were swollen but he believes it is due to his recent increase in activities over the last few days. Pt states when he ambulates his legs are "archy". RN explained if pain is involved he needs to seek medical attention immediately. Pt states he will take some Tylenol and see his kidney doctor today. RN encouraged pt once again if severe pain he needs to seek medical attention. Pt verbalized an understanding but will wait once again to consult his kidney provider later today. RN will continue to offer community home visits however pt feels the ongoing telephone calls are appreciated and receptive to the calls. Will outreach to pt in one month however verified if pt needs to consult RN case manager prior to the call to call RN directly with the informed contact number. No other inquires or request at this time.  Raina Mina, RN Care Management Coordinator [place] Office [phone]

## 2017-08-26 NOTE — Telephone Encounter (Signed)
Patient left message he needs pen needles sent as soon as possible to [facility] on [address]. Danley Danker, RN [facility] RN)

## 2017-08-27 MED ORDER — PEN NEEDLES 32G X 6 MM MISC: 1.0000 [IU] | 11 refills | Status: DC | PRN

## 2017-08-27 NOTE — Telephone Encounter (Signed)
Sent rx for pen [facility] to [facility].

## 2017-08-28 ENCOUNTER — Ambulatory Visit: Payer: Medicare Other

## 2017-08-28 ENCOUNTER — Telehealth: Payer: Self-pay | Admitting: Internal Medicine

## 2017-08-28 ENCOUNTER — Other Ambulatory Visit: Payer: Self-pay | Admitting: Pharmacist

## 2017-08-28 NOTE — Patient Outreach (Signed)
[facility]) Care Management  08/28/2017  Anthony [name] 1950/09/16 [id number]  Placed a call to Dr Olin Pia office, cardiology, to follow-up on losartan.  Patient resumed 08/21/17 based off of after visit summary and 08/18/17 notes indicated losartan was held secondary to renal function.    Received call back from [place], RN with Dr Caryl Comes, who confirms patient was to hold losartan pending repeat renal function.   Discussed above with [place], she reported she would contact patient to reiterate Dr Olin Pia directions and reschedule lab appointment.   Please see telephone note from her today 08/28/17, for details.   Noted that patient's appointment with PCP office was cancelled for later today as well.   Plan:  Will continue to follow-up with patient.    Message sent to PharmD in [facility] for assistance with clarification of rosuvastatin dose and paroxetine discrepancies noted on 08/13/17.    Karrie Meres, PharmD, [place] [phone]

## 2017-08-28 NOTE — Telephone Encounter (Signed)
Pt c/o medication issue:  1. Name of Medication: Losartan   2. How are you currently taking this medication (dosage and times per day)? 25 mg// 1x daily   3. Are you having a reaction (difficulty breathing--STAT)? no  4. What is your medication issue? Lennette Bihari a pharmacist with [facility] is calling to verify if patient should continue taking the medication or if he should hold medication.

## 2017-08-28 NOTE — Telephone Encounter (Signed)
I spoke with Anthony [name], a pharmacist with [facility] who has been working with Anthony [name] and his medication list. He stated Anthony [name] began taking his Losartan again on March 28th based off his AVS. Dr Caryl Comes had wanted pt to hold his Losartan until he had a repeat BMP on 4/8 for decreased renal function.   I called Anthony [name] to discuss. He confirmed he had started his Losartan on March 28th. I advised Anthony [name] to continue to hold his Losartan and I would move his repeat BMP to April 12 to reassess kidney function. I told him I would call him to let him know when he should begin his losartan again. He verbalized understanding of medication instructions and lab draw instructions. I advised him to call me with any questions regarding his heart medicines.

## 2017-09-01 ENCOUNTER — Other Ambulatory Visit: Payer: Medicare Other

## 2017-09-01 ENCOUNTER — Telehealth: Payer: Self-pay | Admitting: Pharmacist

## 2017-09-01 ENCOUNTER — Telehealth: Payer: Self-pay | Admitting: Internal Medicine

## 2017-09-01 MED ORDER — FUROSEMIDE 40 MG PO TABS: 40.0000 mg | ORAL_TABLET | Freq: Every day | ORAL | 3 refills | Status: DC

## 2017-09-01 MED ORDER — ROSUVASTATIN CALCIUM 5 MG PO TABS: 5.0000 mg | ORAL_TABLET | Freq: Every day | ORAL | Status: DC

## 2017-09-01 NOTE — Telephone Encounter (Signed)
-----   Message from Lin Givens, [facility] sent at 08/28/2017  9:57 AM EDT ----- Regarding: question  Hi Pete:  Quick question---I had sent a note to Dr Lindell Noe last month regarding a couple of primary care med concerns on patient but didn't hear back.  He has appt with geriatric clinic in [facility] at 1330 today, not sure who the provider is.    Is there anyway you would be able to help get this patient's rosuvastatin dose clarified (he's taking 5 mg not 20 mg) and fluoxetine vs paroxetine---pt continues paroxetine.  Both changes appeared to have come from 06/19/17 discharge.    Thanks for any help you can provide.   Lennette Bihari   [phone]

## 2017-09-01 NOTE — Telephone Encounter (Signed)
Spoke with pt today regarding his fall. I told him his device showed it was WNL. I suggested he take tylenol for shoulder soreness and alternate ice on and off 51min at a time. He then c/o a 6lb weight gain since we have stopped his furosemide for elevated creatinine.   I spoke with Dr End, DOD, and he ordered pt to begin lasix 40mg  bid and have a repeat BMP with f/up asap. Pt stated he has an appt with his PCP tomorrow (4/9). I have messaged his PCP to update her on recent medication changes and to request he have a BMP drawn tomorrow during his OV. Anthony [name] stated he will also make this request.   Lastly, a f/up has been scheduled with Dr Caryl Comes on 4/19 unless there is a cancellation for something sooner. Unfortunately, there is no availability with an APP or Dr Caryl Comes until that time.

## 2017-09-01 NOTE — Telephone Encounter (Signed)
F/U Call:  Patient calling, states that he is returning call

## 2017-09-01 NOTE — Telephone Encounter (Signed)
Reviewed normal remote transmission.

## 2017-09-01 NOTE — Telephone Encounter (Signed)
Anthony [name] is calling because he has a pacemaker that was just placed. He had a fall and he used the side that the pacemaker is in to break the fall and is pretty sore especially when he try to take a deep breath or move the (L) arm . Please call   Thanks

## 2017-09-01 NOTE — Telephone Encounter (Signed)
Phone call made by pharmacy student and dose was clarified as:  Rosuvastatin 5 mg tablet once daily and Paroxetine 40 mg tablet once daily   Medication list updated.

## 2017-09-01 NOTE — Telephone Encounter (Signed)
Spoke w/ pt and instructed him how to send a remote transmission w/ his home monitor. Transmission received. Informed pt that Device Tech RN will review and call him back. Pt verbalized understanding.

## 2017-09-02 ENCOUNTER — Other Ambulatory Visit: Payer: Self-pay | Admitting: Pharmacist

## 2017-09-02 ENCOUNTER — Encounter: Payer: Self-pay | Admitting: Family Medicine

## 2017-09-02 ENCOUNTER — Ambulatory Visit (INDEPENDENT_AMBULATORY_CARE_PROVIDER_SITE_OTHER): Payer: Medicare Other | Admitting: Family Medicine

## 2017-09-02 ENCOUNTER — Telehealth: Payer: Self-pay

## 2017-09-02 ENCOUNTER — Other Ambulatory Visit: Payer: Self-pay

## 2017-09-02 VITALS — BP 130/70 | HR 90 | Temp 97.9°F | Wt 237.4 lb

## 2017-09-02 DIAGNOSIS — Z794 Long term (current) use of insulin: Secondary | ICD-10-CM | POA: Diagnosis not present

## 2017-09-02 DIAGNOSIS — E119 Type 2 diabetes mellitus without complications: Secondary | ICD-10-CM | POA: Diagnosis not present

## 2017-09-02 DIAGNOSIS — IMO0001 Reserved for inherently not codable concepts without codable children: Secondary | ICD-10-CM

## 2017-09-02 DIAGNOSIS — I5022 Chronic systolic (congestive) heart failure: Secondary | ICD-10-CM

## 2017-09-02 LAB — POCT GLYCOSYLATED HEMOGLOBIN (HGB A1C): Hemoglobin A1C: 8.1

## 2017-09-02 NOTE — Patient Instructions (Signed)
It was a pleasure to see you today! Thank you for choosing [facility] for your primary care. Anthony [name] was seen for fall, CHF.   Our plans for today were:  Increase your lasix to 40mg  twice per day, once in the morning and once at dinner time.   See Dr. Caryl Comes as scheduled.   Keep your insulin the same.   You should return to our clinic to see Dr. Lindell Noe in 3 months for diabetes.   Best,  Dr. Lindell Noe

## 2017-09-02 NOTE — Telephone Encounter (Signed)
Delana Meyer, RN from [facility], called today. Anthony [name] is in for an OV and she is calling to verify his Furosemide dose. Yesterday, Dr End wrote for him to begin Furosemide 72mb bid with a BMP draw asap for a 6lb weight gain.  Delana Meyer stated he has gained appx 10lbs between now and his hospital stay on 3/27. She stated they will have his BMP drawn today and Dr Lindell Noe will further assess his fluid status when she assess him.

## 2017-09-02 NOTE — Patient Outreach (Signed)
[place] [facility]) Care Management  09/02/2017  TAY WHITWELL 23-Dec-1950 [id number]  Unsuccessful phone outreach to patient to follow-up on [place] related patient needs.  HIPAA compliant message left requesting return call.   Plan:  Will continue to attempt patient outreach.   Karrie Meres, PharmD, [place] [phone]

## 2017-09-02 NOTE — Progress Notes (Signed)
   CC: DM, fluid status per cards  HPI  DM - taking 55U once per day. Learning how to use pen needles, used have syringe. Had the pharmacist give him teaching w these.   CHF - states weight is up. Weight at home 230 today. Last Thursday weight was 225-6 at home. Took 4m of lasix this am. States he was taking 87mdaily or BID before. UOP normal. Noticed no change in UOP After lasix this am. Four times voiding yesterday. Doesn't recall that the CHF RN told him to use lasix 4027mID not QD. CHF RN had messaged me asking to check a BMP and fluid status on exam today.   Wt Readings from Last 3 Encounters:  09/02/17 237 lb 6.4 oz (107.7 kg)  08/20/17 220 lb (99.8 kg)  07/26/17 240 lb 8.4 oz (109.1 kg)     Fall - 2 days ago. Tripped on a tree limb on the ground by his truck. Grabbed side of the truck with his left arm. Did not hit the ground. Hit ribs on left side. No pain with deep breathing. Mostly worried about his pacer. Had some arthritis pills and took those. Still on opana from neurosurgery. No pain today.   Wt Readings from Last 3 Encounters:  09/02/17 237 lb 6.4 oz (107.7 kg)  08/20/17 220 lb (99.8 kg)  07/26/17 240 lb 8.4 oz (109.1 kg)    ROS: Denies CP, SOB, abdominal pain, dysuria, changes in BMs.   CC, SH/smoking status, and VS noted  Objective: BP 130/70   Pulse 90   Temp 97.9 F (36.6 C) (Oral)   Wt 237 lb 6.4 oz (107.7 kg)   SpO2 98%   BMI 31.32 kg/m  Gen: NAD, alert, cooperative, and pleasant. HEENT: NCAT, EOMI, PERRL CV: RRR, no murmur Resp: CTAB, no wheezes, non-labored Abd: SNTND, BS present, no guarding or organomegaly Ext: 3+ pitting edema to knees bilaterally  warm Neuro: Alert and oriented, Speech clear, No gross deficits  Assessment and plan:  Congestive heart failure, NYHA class 3, chronic, systolic (HCC) Mild volume overload based on weight and 3+ BLE edema, but no crackles on exam. Repeat BMP, encouraged him to take lasix 41m76mD. Continue  close CHF follow up. Messaged CHF RN, he will follow up 4/11 pm.   IDDM (insulin dependent diabetes mellitus) (HCC)[place] current management, would not favor tight CBG control given possible underlying dementia.    Orders Placed This Encounter  Procedures  . Basic metabolic panel  . HgB A1c    No orders of the defined types were placed in this encounter.   KateRalene Ok, PGY2 09/04/2017 2:00 PM

## 2017-09-03 ENCOUNTER — Other Ambulatory Visit: Payer: Self-pay | Admitting: Pharmacist

## 2017-09-03 ENCOUNTER — Telehealth: Payer: Self-pay

## 2017-09-03 LAB — BASIC METABOLIC PANEL
BUN/Creatinine Ratio: 10 (ref 10–24)
BUN: 24 mg/dL (ref 8–27)
CO2: 23 mmol/L (ref 20–29)
Calcium: 9.3 mg/dL (ref 8.6–10.2)
Chloride: 105 mmol/L (ref 96–106)
Creatinine, Ser: 2.5 mg/dL — ABNORMAL HIGH (ref 0.76–1.27)
GFR calc Af Amer: 30 mL/min/{1.73_m2} — ABNORMAL LOW (ref >59)
GFR calc non Af Amer: 26 mL/min/{1.73_m2} — ABNORMAL LOW (ref >59)
Glucose: 41 mg/dL — ABNORMAL LOW (ref 65–99)
Potassium: 5.3 mmol/L — ABNORMAL HIGH (ref 3.5–5.2)
Sodium: 142 mmol/L (ref 134–144)

## 2017-09-03 NOTE — Telephone Encounter (Signed)
Per DOD, [place], pt is to see APP.   Spoke with pt today to clarify weights. Weight recorded on 3/27 (220lbs) was a pre procedure dry weight. Today pt states his dry weight is 230lbs. 4/09 dry weight was also 230lbs. Pt states weight has not changed since beginning lasix 40mg  bid on 4/8. In addition, Creatinine has risen to 2.50, up from 2.09 one month prior. Recently, losartan and lasix has been held due to continuous rise in creatinine.   Pt agrees to plan of seeing APP tomorrow 4/11 at our [place] location.

## 2017-09-03 NOTE — Patient Outreach (Signed)
[facility]) Care Management  09/03/2017  Anthony [name] [birth year] [id number]  Successful phone outreach to patient, HIPAA details verified.    Patient reports he did stop losartan last week per phone note in chart from 08/28/17.  He reports he is following up with cardiology tomorrow and denies medication questions at this time.    Patient declines [facility] Pharmacist home visit at this time.  He reports he has no further pharmacy needs at this time.   Plan:  Provided patient with [facility] Pharmacist phone number should he be interested in home visit or have questions later.    Will close pharmacy episode.   Will update [facility] RN Lattie Haw.   Karrie Meres, PharmD, [place] [phone]

## 2017-09-04 ENCOUNTER — Encounter: Payer: Self-pay | Admitting: Physician Assistant

## 2017-09-04 ENCOUNTER — Ambulatory Visit (INDEPENDENT_AMBULATORY_CARE_PROVIDER_SITE_OTHER): Payer: Medicare Other | Admitting: Physician Assistant

## 2017-09-04 VITALS — BP 118/66 | HR 87 | Ht 73.0 in

## 2017-09-04 DIAGNOSIS — I5043 Acute on chronic combined systolic (congestive) and diastolic (congestive) heart failure: Secondary | ICD-10-CM

## 2017-09-04 DIAGNOSIS — N189 Chronic kidney disease, unspecified: Secondary | ICD-10-CM | POA: Diagnosis not present

## 2017-09-04 DIAGNOSIS — I2510 Atherosclerotic heart disease of native coronary artery without angina pectoris: Secondary | ICD-10-CM | POA: Diagnosis not present

## 2017-09-04 DIAGNOSIS — N289 Disorder of kidney and ureter, unspecified: Secondary | ICD-10-CM | POA: Diagnosis not present

## 2017-09-04 DIAGNOSIS — I10 Essential (primary) hypertension: Secondary | ICD-10-CM | POA: Diagnosis not present

## 2017-09-04 DIAGNOSIS — Z95810 Presence of automatic (implantable) cardiac defibrillator: Secondary | ICD-10-CM

## 2017-09-04 DIAGNOSIS — I429 Cardiomyopathy, unspecified: Secondary | ICD-10-CM | POA: Diagnosis not present

## 2017-09-04 NOTE — Assessment & Plan Note (Signed)
Continue current management, would not favor tight CBG control given possible underlying dementia.

## 2017-09-04 NOTE — Progress Notes (Signed)
Cardiology Office Note   Date:  09/04/2017   ID:  Anthony, [name] [birth year], MRN [id number]  PCP:  Sela Hilding, MD  Cardiologist: Dr. Caryl Comes and Dr. Asencion Partridge, PA-C    History of Present Illness: Anthony [name] is a 67 y.o. male with a history of Afib, RVR and CHB>>MDT ICD CLARIA MRI [id number] 07/25/2017>>AVN ablation 08/20/2017, CFX>OM2 stent 2001, MV low risk 2015, S-D-CHF, DM, HLD, HTN, GERD, OSA, obesity, CHA2DS2VASc=5 (age x 1, CHF, CAD, DM, HTN) on Eliquis  Anthony [name] presents for cardiology follow up.  He was on Lasix 80 mg bid>>80 mg am, 40 mg pm>>40 mg qd>>Lasix 40 mg bid 04/09 by Dr End. Dr Lorrene Reid also follows his renal function.   He is waking with a little fluid in his legs, it gets worse during the day. By evening, it is uncomfortable.   No bleeding issues with the Eliquis.   His ICD has not fired. No palpitations, no presyncope or syncope.  He has been weighing at home, but has regular scale, it is hard to see the numbers. His weight has been 226-230 lbs. Cr checked 04/09 and was 2.5, higher than usual for him.  He has dyspnea on exertion, not much worse than usual.  May have some orthopnea, but denies PND.  He is able to walk more without getting out of breath.   No chest pain with exertion.   Wants to lay on his L side, is that ok? Can he do yard work?   Past Medical History:  Diagnosis Date  . Arthritis   . CAD in native artery    a. reported MI 2000, 2001, s/p stenting of the circumflex lesion extending into the second obtuse marginal branch with a drug-eluting stent in 2003. b. low risk nuc 2015.  Marland Kitchen Chronic atrial fibrillation ([place])   . Chronic combined systolic and diastolic CHF (congestive heart failure) ([place])    a. Previously diastolic, then EF 123XX123 in 10/2016.  . CKD (chronic kidney disease), stage III ([place])   . Depression   . Diabetes mellitus   . Edema of extremities   . Erectile dysfunction   . GERD  (gastroesophageal reflux disease)   . Hyperlipidemia   . Hypertension   . Myocardial infarction ([place]) 2000&2001  . Obesity   . Onychomycosis   . OSA (obstructive sleep apnea)   . S/P ICD (internal cardiac defibrillator) procedure and BiV device,  07/25/17 Medtronic  07/26/2017  . Seizures ([place])   . Tubular adenoma of colon 10/2002    Past Surgical History:  Procedure Laterality Date  . ANGIOPLASTY  2001   stent x 1  . AV NODE ABLATION N/A 08/20/2017   Procedure: AV NODE ABLATION;  Surgeon: Deboraha Sprang, MD;  Location: [facility];  Service: Cardiovascular;  Laterality: N/A;  . BIV ICD INSERTION CRT-D N/A 07/25/2017   Procedure: BIV ICD INSERTION CRT-D;  Surgeon: Deboraha Sprang, MD;  Location: [facility];  Service: Cardiovascular;  Laterality: N/A;  . COLONOSCOPY  2000?   negative  . FOOT ARTHROTOMY Right   . TOE AMPUTATION Left 2012    Current Outpatient Medications  Medication Sig Dispense Refill  . acetaminophen (TYLENOL) 325 MG tablet Take 1-2 tablets (325-650 mg total) by mouth every 4 (four) hours as needed for mild pain.    Marland Kitchen apixaban (ELIQUIS) 5 MG TABS tablet Take 1 tablet (5 mg total) by mouth 2 (two) times daily. [place]  tablet 11  . calcitRIOL (ROCALTROL) 0.25 MCG capsule TAKE 0.25 MG BY MOUTH daily  5  . furosemide (LASIX) 40 MG tablet Take 1 tablet (40 mg total) by mouth daily. (Patient taking differently: Take 40 mg by mouth 2 (two) times daily. ) 90 tablet 3  . gabapentin (NEURONTIN) 300 MG capsule TAKE ONE CAPSULE BY MOUTH AT BEDTIME (Patient taking differently: TAKE 300 MG BY MOUTH AT BEDTIME) 90 capsule 0  . hydrALAZINE (APRESOLINE) 25 MG tablet Take 1 tablet (25 mg total) by mouth every 8 (eight) hours. 90 tablet 0  . Insulin Isophane & Regular Human (HUMULIN 70/30 KWIKPEN) (70-30) 100 UNIT/ML PEN Inject 55 Units into the skin daily. 15 mL 1  . Insulin Pen Needle (PEN NEEDLES) 32G X 6 MM MISC 1 Units by Does not apply route as needed. 100 each 11  .  labetalol (NORMODYNE) 200 MG tablet Take 2 tablets (400 mg total) by mouth 2 (two) times daily. 120 tablet 1  . magnesium oxide (MAG-OX) 400 (241.3 Mg) MG tablet Take 0.5 tablets (200 mg total) by mouth daily. (Patient taking differently: Take 400 mg by mouth daily. ) 30 tablet 0  . Multiple Vitamin (MULTIVITAMIN) tablet Take 1 tablet by mouth daily.      . nitroGLYCERIN (NITROSTAT) 0.4 MG SL tablet Place 0.4 mg under the tongue every 5 (five) minutes as needed for chest pain. If no relief by 3rd tab, call 911     . Omega 3 1000 MG CAPS Take 2,000 mg by mouth daily.     Marland Kitchen omeprazole (PRILOSEC) 40 MG capsule TAKE ONE CAPSULE BY MOUTH DAILY 30 capsule 0  . oxymorphone (OPANA) 5 MG tablet Take 5 mg by mouth every 6 (six) hours as needed for pain (max 3 tablets daily). Per Dr Brien Few    . PARoxetine (PAXIL) 40 MG tablet Take 40 mg by mouth daily.    . polyethylene glycol (MIRALAX / GLYCOLAX) packet Take 17 g by mouth daily. 14 each 0  . potassium chloride SA (K-DUR,KLOR-CON) 20 MEQ tablet Take 1 tablet (20 mEq total) by mouth 2 (two) times daily. 180 tablet 3  . rosuvastatin (CRESTOR) 5 MG tablet Take 1 tablet (5 mg total) by mouth daily.     No current facility-administered medications for this visit.     Allergies:   Enalapril maleate    Social History:  The patient  reports that he quit smoking about 14 months ago. His smoking use included cigarettes. He started smoking about 50 years ago. He has a 11.25 pack-year smoking history. He has never used smokeless tobacco. He reports that he does not drink alcohol or use drugs.   Family History:  The patient's family history includes Heart attack in his father and mother.    ROS:  Please see the history of present illness. All other systems are reviewed and negative.    PHYSICAL EXAM: VS:  BP 118/66 (BP Location: Left Arm, Patient Position: Sitting, Cuff Size: Normal)   Pulse 87   Ht '6\' 1"'$  (1.854 m)   SpO2 97%   BMI 31.32 kg/m  , BMI Body  mass index is 31.32 kg/m. GEN: Well nourished, well developed, male in no acute distress  HEENT: normal for age  Neck: minimal JVD, no carotid bruit, no masses Cardiac: RRR; soft murmur, no rubs, or gallops Respiratory:  clear to auscultation bilaterally, normal work of breathing GI: soft, nontender, nondistended, + BS MS: no deformity or atrophy; 2+ lower extremity edema;  distal pulses are 2+ in all 4 extremities   Skin: warm and dry, no rash Neuro:  Strength and sensation are intact Psych: euthymic mood, full affect   EKG:  EKG is not ordered today.  ECHO: 11/21/2016 - Left ventricle: The cavity size was normal. There was severe   concentric hypertrophy. Systolic function was severely reduced.   The estimated ejection fraction was in the range of 25% to 30%.   Diffuse hypokinesis. - Left atrium: The atrium was severely dilated. - Right ventricle: Systolic function was moderately reduced. - Right atrium: The atrium was moderately dilated. - Tricuspid valve: There was trivial regurgitation. - Pulmonary arteries: Systolic pressure was at the upper limits of   normal. - Inferior vena cava: The vessel was normal in size. - Pericardium, extracardiac: There was no pericardial effusion. Impressions: - When compared to the prior study from 02/23/2014, LVEF has   decreased from 55-60% to 25-30% with diffuse hypokinesis, RVEF is   moderately decreased. This is possibly tachycardia mediated, the   study was acquired while patient in atrial fibrillation with RVR.  CATH: 03/19/2002 DIAGNOSTIC FINDINGS:  1. Left main:  Angiographically normal.  2. LAD:  The LAD is a large vessel giving rise to two diagonal branches.  It     is angiographically normal.  3. Circumflex:  The circumflex is a large vessel giving rise to two obtuse     marginal branches.  There is a 60-70% stenosis of the mid circumflex     extending into the proximal portion of the second obtuse marginal branch.  4. RCA:   The RCA is a large vessel.  There is a 20% stenosis of the mid     vessel.  5. LV:  EF equals 65% without regional wall motion abnormality.  PERCUTANEOUS CORONARY INTERVENTION TECHNIQUE:  The lesion of the circumflex  was directly stented with a 3.0 x 18 mm CYPHER stent  deployed at 14 atmospheres.  The mid and distal portions of the stent were  post-dilated using a 3.25 x 12 mm Quantum balloon at 14 atmospheres.  The  proximal portion of the stent was post-dilated using this balloon at 16  atmospheres.   IMPRESSION/PLAN:  Successful stenting of the circumflex lesion extending at  a second obtuse marginal branch using a drug-eluting stent.   Recent Labs: 11/21/2016: NT-Pro BNP 930 11/26/2016: TSH 1.320 06/12/2017: ALT 34 06/19/2017: Magnesium 1.8 08/08/2017: Hemoglobin 14.2; Platelets 139 09/02/2017: BUN 24; Creatinine, Ser 2.50; Potassium 5.3; Sodium 142    Lipid Panel    Component Value Date/Time   CHOL 108 12/24/2016 1313   TRIG 89 12/24/2016 1313   HDL 35 (L) 12/24/2016 1313   CHOLHDL 3.1 12/24/2016 1313   CHOLHDL 3.2 02/28/2016 0906   VLDL 19 02/28/2016 0906   LDLCALC 55 12/24/2016 1313   LDLDIRECT 58 11/06/2011 1434     Wt Readings from Last 3 Encounters:  09/02/17 237 lb 6.4 oz (107.7 kg)  08/20/17 220 lb (99.8 kg)  07/26/17 240 lb 8.4 oz (109.1 kg)     Other studies Reviewed: Additional studies/ records that were reviewed today include: office notes, hospital records and testing.  ASSESSMENT AND PLAN:  1.  Acute on chronic combined systolic and diastolic CHF: Because his creatinine was higher than normal for him, and because he has only been on the current dose of Lasix a couple of days, I will continue this for now.  - He is encouraged to watch the sodium carefully in his  diet and not over drink.  - He has an appointment scheduled with Dr. Caryl Comes on the 19th, reassess at that time and make medication changes as needed. - Though his weight is up, he is breathing  better and feels that he is able to do a little more. -He was given a digital scale  2.  Acute on chronic renal insufficiency: In January, his creatinine was generally less than 2.  In February and March, his creatinine is fluctuated between 2 and 2.5.  - 2.5 is the highest reading and also the most recent.  However, his BUN has been within normal limits.  - He has been seen by Dr. Lorrene Reid in the past - if we cannot keep the fluid off without worsening his creatinine, may need to let Nephrology manage his volume.  3. S/P ICD w/ AVN ablation:  -The improvement in his exercise tolerance may be related to the functioning ICD. -He was by V pacing approximately 50% of the time at his wound check. -Management per Dr. Caryl Comes.  4.  Hypertension: Good control on current medications.  5.  CAD: Circumflex stent 2003, nonischemic Myoview 2015 -He is on a statin and a beta-blocker, no ACE or ARB because of his poor renal function.  6.  Cardiomyopathy: His EF is newly decreased.  This is felt secondary to tachycardia/atrial fibrillation. -Consider changing labetalol to carvedilol -Continue hydralazine, Lasix and potassium -Recheck echo in 3 months   Current medicines are reviewed at length with the patient today.  The patient does not have concerns regarding medicines.  The following changes have been made:  no change  Labs/ tests ordered today include:  No orders of the defined types were placed in this encounter.    Disposition:   FU with Dr. Caryl Comes on 4/19 and Dr. Meda Coffee in 4 weeks  Signed, Rosaria Ferries, PA-C  09/04/2017 2:28 PM    [place] Phone: [phone]; Fax: [phone]  This note was written with the assistance of speech recognition software. Please excuse any transcriptional errors.

## 2017-09-04 NOTE — Assessment & Plan Note (Signed)
Mild volume overload based on weight and 3+ BLE edema, but no crackles on exam. Repeat BMP, encouraged him to take lasix 40mg  BID. Continue close CHF follow up. Messaged CHF RN, he will follow up 4/11 pm.

## 2017-09-04 NOTE — Patient Instructions (Signed)
Weigh daily in morning  Record weights and bring list to appointment   Take Lasix 40 mg twice a day    500 mg sodium per meal    No more than 2 liters of fluid daily     Lab work ( bmet ) at [place] office visit 4/19     Your physician recommends that you schedule a follow-up appointment in: 4 weeks with Dr.Nelson or Pa on her team

## 2017-09-05 ENCOUNTER — Other Ambulatory Visit: Payer: Medicare Other

## 2017-09-11 ENCOUNTER — Ambulatory Visit (INDEPENDENT_AMBULATORY_CARE_PROVIDER_SITE_OTHER): Payer: Medicare Other | Admitting: Internal Medicine

## 2017-09-11 ENCOUNTER — Encounter: Payer: Self-pay | Admitting: Internal Medicine

## 2017-09-11 ENCOUNTER — Other Ambulatory Visit: Payer: Self-pay | Admitting: Internal Medicine

## 2017-09-11 VITALS — BP 146/98 | HR 89 | Ht 73.0 in | Wt 241.1 lb

## 2017-09-11 DIAGNOSIS — Z95810 Presence of automatic (implantable) cardiac defibrillator: Secondary | ICD-10-CM | POA: Diagnosis not present

## 2017-09-11 DIAGNOSIS — I429 Cardiomyopathy, unspecified: Secondary | ICD-10-CM

## 2017-09-11 DIAGNOSIS — N189 Chronic kidney disease, unspecified: Secondary | ICD-10-CM

## 2017-09-11 DIAGNOSIS — I428 Other cardiomyopathies: Secondary | ICD-10-CM

## 2017-09-11 DIAGNOSIS — N289 Disorder of kidney and ureter, unspecified: Secondary | ICD-10-CM

## 2017-09-11 MED ORDER — LABETALOL HCL 200 MG PO TABS: 400.0000 mg | ORAL_TABLET | Freq: Two times a day (BID) | ORAL | 1 refills | Status: DC

## 2017-09-11 MED ORDER — FUROSEMIDE 40 MG PO TABS: 40.0000 mg | ORAL_TABLET | Freq: Two times a day (BID) | ORAL | 3 refills | Status: DC

## 2017-09-11 MED ORDER — HYDRALAZINE HCL 25 MG PO TABS: 25.0000 mg | ORAL_TABLET | Freq: Three times a day (TID) | ORAL | 0 refills | Status: DC

## 2017-09-11 NOTE — Progress Notes (Signed)
Patient Care Team: Sela Hilding, MD as PCP - General (Family Medicine) Dorothy Spark, MD as PCP - Cardiology (Cardiology) Tobi Bastos, RN as [facility] Management   HPI  Anthony [name] is a 67 y.o. male Seen for atrial fibrillation with a poorly controlled ventricular response now s/p AV ablation.3/19  Is a history of ischemic heart disease with stenting 2003.  He was noted to have low voltage and left ventricular hypertrophy and the issue was raised as to whether he might have transthyretin amyloid technetium; pyrophosphate imaging was notable for only grade 1 findings.  He has not undergone electrophoresis testing.  DATE TEST    7/15  Myoview    No ischemia  9/15  Echo   EF 55-60 %   6/18 Echo EF 25-30% Severe LAE     Date Cr K Mg Hgb  10/17 1.55 3.4  13.7  7/18 1.82  1.5  13.8  9/18 2.2 4.5  13.5  3/19 2.09 3.3    4/19 2.5 5.3             He is somewhat improved over recent days following outpt titration of his diuretics--a challenge with renal insufficiency No interval dizziness; no chest pain  Some edema   We talked of sickle cell that contributed to the deathof his (step?) daughter and whose son now lives with him    Past Medical History:  Diagnosis Date  . Arthritis   . CAD in native artery    a. reported MI 2000, 2001, s/p stenting of the circumflex lesion extending into the second obtuse marginal branch with a drug-eluting stent in 2003. b. low risk nuc 2015.  Marland Kitchen Chronic atrial fibrillation ([place])   . Chronic combined systolic and diastolic CHF (congestive heart failure) ([place])    a. Previously diastolic, then EF 123XX123 in 10/2016.  . CKD (chronic kidney disease), stage III ([place])   . Depression   . Diabetes mellitus   . Edema of extremities   . Erectile dysfunction   . GERD (gastroesophageal reflux disease)   . Hyperlipidemia   . Hypertension   . Myocardial infarction ([place]) 2000&2001  . Obesity   .  Onychomycosis   . OSA (obstructive sleep apnea)   . S/P ICD (internal cardiac defibrillator) procedure and BiV device,  07/25/17 Medtronic  07/26/2017  . Seizures ([place])   . Tubular adenoma of colon 10/2002    Past Surgical History:  Procedure Laterality Date  . ANGIOPLASTY  2001   stent x 1  . AV NODE ABLATION N/A 08/20/2017   Procedure: AV NODE ABLATION;  Surgeon: Deboraha Sprang, MD;  Location: [facility];  Service: Cardiovascular;  Laterality: N/A;  . BIV ICD INSERTION CRT-D N/A 07/25/2017   Procedure: BIV ICD INSERTION CRT-D;  Surgeon: Deboraha Sprang, MD;  Location: [facility];  Service: Cardiovascular;  Laterality: N/A;  . COLONOSCOPY  2000?   negative  . FOOT ARTHROTOMY Right   . TOE AMPUTATION Left 2012    Current Outpatient Medications  Medication Sig Dispense Refill  . acetaminophen (TYLENOL) 325 MG tablet Take 1-2 tablets (325-650 mg total) by mouth every 4 (four) hours as needed for mild pain.    Marland Kitchen apixaban (ELIQUIS) 5 MG TABS tablet Take 1 tablet (5 mg total) by mouth 2 (two) times daily. 60 tablet 11  . calcitRIOL (ROCALTROL) 0.25 MCG capsule TAKE 0.25 MG BY MOUTH daily  5  .  furosemide (LASIX) 40 MG tablet Take 1 tablet (40 mg total) by mouth daily. (Patient taking differently: Take 40 mg by mouth 2 (two) times daily. ) 90 tablet 3  . gabapentin (NEURONTIN) 300 MG capsule TAKE ONE CAPSULE BY MOUTH AT BEDTIME (Patient taking differently: TAKE 300 MG BY MOUTH AT BEDTIME) 90 capsule 0  . hydrALAZINE (APRESOLINE) 25 MG tablet Take 1 tablet (25 mg total) by mouth every 8 (eight) hours. 90 tablet 0  . Insulin Isophane & Regular Human (HUMULIN 70/30 KWIKPEN) (70-30) 100 UNIT/ML PEN Inject 55 Units into the skin daily. 15 mL 1  . Insulin Pen Needle (PEN NEEDLES) 32G X 6 MM MISC 1 Units by Does not apply route as needed. 100 each 11  . labetalol (NORMODYNE) 200 MG tablet Take 2 tablets (400 mg total) by mouth 2 (two) times daily. 120 tablet 1  . magnesium oxide (MAG-OX)  400 (241.3 Mg) MG tablet Take 0.5 tablets (200 mg total) by mouth daily. (Patient taking differently: Take 400 mg by mouth daily. ) 30 tablet 0  . Multiple Vitamin (MULTIVITAMIN) tablet Take 1 tablet by mouth daily.      . nitroGLYCERIN (NITROSTAT) 0.4 MG SL tablet Place 0.4 mg under the tongue every 5 (five) minutes as needed for chest pain. If no relief by 3rd tab, call 911     . Omega 3 1000 MG CAPS Take 2,000 mg by mouth daily.     Marland Kitchen omeprazole (PRILOSEC) 40 MG capsule TAKE ONE CAPSULE BY MOUTH DAILY 30 capsule 0  . oxymorphone (OPANA) 5 MG tablet Take 5 mg by mouth every 6 (six) hours as needed for pain (max 3 tablets daily). Per Dr Brien Few    . PARoxetine (PAXIL) 40 MG tablet Take 40 mg by mouth daily.    . polyethylene glycol (MIRALAX / GLYCOLAX) packet Take 17 g by mouth daily. 14 each 0  . potassium chloride SA (K-DUR,KLOR-CON) 20 MEQ tablet Take 1 tablet (20 mEq total) by mouth 2 (two) times daily. 180 tablet 3  . rosuvastatin (CRESTOR) 5 MG tablet Take 1 tablet (5 mg total) by mouth daily.     No current facility-administered medications for this visit.     Allergies  Allergen Reactions  . Enalapril Maleate Cough      Review of Systems negative except from HPI and PMH  Physical Exam BP (!) 146/98   Pulse 89   Ht 6' 1"$  (1.854 m)   Wt 241 lb 1.6 oz (109.4 kg)   SpO2 98%   BMI 31.81 kg/m   Well developed and nourished in no acute distress HENT normal Neck supple with JVP-flat Clear Regular rate and rhythm, no murmurs or gallops Abd-soft with active BS No Clubbing cyanosis 1-2+ edema Skin-warm and dry A & Oriented  Grossly normal sensory and motor function   ECG  vpacing @ 90   Assessment and  Plan  Atrial fibrillation-permanent rapid ventricular response  AV ablation   Low-voltage limb leads and severe concentric LVH concerning for amyloid   Congestive heart failure-chronic-systolic-class IIb 3  Hypertension  Renal insufficiency grade  4  Presyncope  Nonischemic cardiomyopathy-new  Implantable defibrillator Medtronic     His CHF is somewhat improved, the challenge as we discussed is the balance with renal function   wil recheck BMET today  We have reprogrammed the pacing rate from 90 >> VVIR 80-120 and will reporgram to 70 in about 2 weeks  Will plan recheck echo when we see him in  3 months   We spent more than 50% of our >25 min visit in face to face counseling regarding the above

## 2017-09-11 NOTE — Patient Instructions (Addendum)
Medication Instructions:  Your physician recommends that you continue on your current medications as directed. Please refer to the Current Medication list given to you today.  Labwork: Your physician recommends that you have a BMP drawn today  Testing/Procedures: None ordered.  Follow-Up: Your physician recommends that you schedule a follow-up appointment in:   2 weeks with the device clinic 3 months with Dr Caryl Comes   Any Other Special Instructions Will Be Listed Below (If Applicable).     If you need a refill on your cardiac medications before your next appointment, please call your pharmacy.

## 2017-09-12 ENCOUNTER — Other Ambulatory Visit: Payer: Self-pay | Admitting: Nurse Practitioner

## 2017-09-12 ENCOUNTER — Telehealth: Payer: Self-pay | Admitting: Internal Medicine

## 2017-09-12 ENCOUNTER — Encounter: Payer: Medicare Other | Admitting: Internal Medicine

## 2017-09-12 LAB — BASIC METABOLIC PANEL
BUN/Creatinine Ratio: 8 — ABNORMAL LOW (ref 10–24)
BUN: 18 mg/dL (ref 8–27)
CO2: 22 mmol/L (ref 20–29)
Calcium: 8.9 mg/dL (ref 8.6–10.2)
Chloride: 107 mmol/L — ABNORMAL HIGH (ref 96–106)
Creatinine, Ser: 2.38 mg/dL — ABNORMAL HIGH (ref 0.76–1.27)
GFR calc Af Amer: 32 mL/min/{1.73_m2} — ABNORMAL LOW (ref >59)
GFR calc non Af Amer: 27 mL/min/{1.73_m2} — ABNORMAL LOW (ref >59)
Glucose: 28 mg/dL — CL (ref 65–99)
Potassium: 4.3 mmol/L (ref 3.5–5.2)
Sodium: 145 mmol/L — ABNORMAL HIGH (ref 134–144)

## 2017-09-12 NOTE — Telephone Encounter (Signed)
New Message   Seth Bake calling from [facility] to give critical labs

## 2017-09-12 NOTE — Telephone Encounter (Signed)
See result note.  

## 2017-09-18 ENCOUNTER — Encounter: Payer: Self-pay | Admitting: Physician Assistant

## 2017-09-24 LAB — HM DIABETES EYE EXAM

## 2017-09-25 ENCOUNTER — Telehealth: Payer: Self-pay

## 2017-09-25 ENCOUNTER — Ambulatory Visit (INDEPENDENT_AMBULATORY_CARE_PROVIDER_SITE_OTHER): Payer: Self-pay | Admitting: *Deleted

## 2017-09-25 DIAGNOSIS — N183 Chronic kidney disease, stage 3 unspecified: Secondary | ICD-10-CM

## 2017-09-25 DIAGNOSIS — I428 Other cardiomyopathies: Secondary | ICD-10-CM

## 2017-09-25 LAB — CUP PACEART MISC DEVICE CHECK
Battery Remaining Longevity: 69 mo
Battery Voltage: 3 V
Brady Statistic AP VP Percent: 0 %
Brady Statistic AP VS Percent: 0 %
Brady Statistic AS VP Percent: 0 %
Brady Statistic AS VS Percent: 0 %
Brady Statistic RA Percent Paced: 0 %
Brady Statistic RV Percent Paced: 99.92 %
Date Time Interrogation Session: 20190502160024
HighPow Impedance: 65 Ohm
Implantable Lead Implant Date: 20190301
Implantable Lead Implant Date: 20190301
Implantable Lead Location: 753858
Implantable Lead Location: 753860
Implantable Pulse Generator Implant Date: 20190301
Lead Channel Impedance Value: 1007 Ohm
Lead Channel Impedance Value: 1026 Ohm
Lead Channel Impedance Value: 1064 Ohm
Lead Channel Impedance Value: 1083 Ohm
Lead Channel Impedance Value: 1140 Ohm
Lead Channel Impedance Value: 1140 Ohm
Lead Channel Impedance Value: 308.092
Lead Channel Impedance Value: 312.348
Lead Channel Impedance Value: 313.212
Lead Channel Impedance Value: 317.612
Lead Channel Impedance Value: 327.681
Lead Channel Impedance Value: 399 Ohm
Lead Channel Impedance Value: 4047 Ohm
Lead Channel Impedance Value: 475 Ohm
Lead Channel Impedance Value: 589 Ohm
Lead Channel Impedance Value: 608 Ohm
Lead Channel Impedance Value: 646 Ohm
Lead Channel Impedance Value: 665 Ohm
Lead Channel Pacing Threshold Amplitude: 0.5 V
Lead Channel Pacing Threshold Amplitude: 3.5 V
Lead Channel Pacing Threshold Pulse Width: 0.4 ms
Lead Channel Pacing Threshold Pulse Width: 0.4 ms
Lead Channel Sensing Intrinsic Amplitude: 24.625 mV
Lead Channel Sensing Intrinsic Amplitude: 24.625 mV
Lead Channel Setting Pacing Amplitude: 3.5 V
Lead Channel Setting Pacing Amplitude: 4.5 V
Lead Channel Setting Pacing Pulse Width: 0.4 ms
Lead Channel Setting Pacing Pulse Width: 0.4 ms
Lead Channel Setting Sensing Sensitivity: 0.3 mV

## 2017-09-25 NOTE — Telephone Encounter (Signed)
Pt called, states he has heard that Ozempic is a good medication for diabetics and wanting to know if this is something that might work for him. Advised I would forward to MD for review, but that one issue may be whether or not his insurance will cover this medication. Pt aware, but would like to see if MD thinks its a good medication for him first.  Pt call back [phone] Wallace Cullens, RN

## 2017-09-26 ENCOUNTER — Telehealth: Payer: Self-pay

## 2017-09-26 ENCOUNTER — Other Ambulatory Visit: Payer: Self-pay | Admitting: Family Medicine

## 2017-09-26 LAB — BASIC METABOLIC PANEL
BUN/Creatinine Ratio: 7 — ABNORMAL LOW (ref 10–24)
BUN: 17 mg/dL (ref 8–27)
CO2: 22 mmol/L (ref 20–29)
Calcium: 9 mg/dL (ref 8.6–10.2)
Chloride: 105 mmol/L (ref 96–106)
Creatinine, Ser: 2.49 mg/dL — ABNORMAL HIGH (ref 0.76–1.27)
GFR calc Af Amer: 30 mL/min/{1.73_m2} — ABNORMAL LOW (ref >59)
GFR calc non Af Amer: 26 mL/min/{1.73_m2} — ABNORMAL LOW (ref >59)
Glucose: 76 mg/dL (ref 65–99)
Potassium: 4.2 mmol/L (ref 3.5–5.2)
Sodium: 141 mmol/L (ref 134–144)

## 2017-09-26 NOTE — Telephone Encounter (Signed)
-----   Message from Deboraha Sprang, MD sent at 09/26/2017 11:08 AM EDT ----- Please Inform Patient that labs show worsening kidney function  Should prob have him stop his magnesium  And he may need referral to renal  Have included PCP on this  Thanks

## 2017-09-26 NOTE — Telephone Encounter (Signed)
Pt made aware of lab results. He states he has been seeing Dr Jamal Maes. He saw her last on April 29th. I have called the office and requested most recent H&P to review her recommendations. Pt stated he would d/c his Mg and await further recommendation.

## 2017-09-29 ENCOUNTER — Encounter: Payer: Self-pay | Admitting: Family Medicine

## 2017-09-30 ENCOUNTER — Other Ambulatory Visit: Payer: Self-pay | Admitting: *Deleted

## 2017-09-30 ENCOUNTER — Other Ambulatory Visit: Payer: Self-pay | Admitting: Cardiology

## 2017-09-30 NOTE — Patient Outreach (Signed)
[facility]) Care Management  09/30/2017  Anthony [name] 23-Jul-1950 [id number]    Telephone Assessment  RN spoke with pt today who indicated he has been doing well in managing his diabetes. Plan of care discussed with goals and interventions. Pt states currently he is working on the right time at night to consume his diabetes snack so his BS will not be to elevated in the morning. States he will consume his snacks earlier in the night to avoid high readings. Reports his readings ranging were around 147 but alterating his evening snack its was 117 this AM. No major issues as pt continue to consume healthier food items in managing his diabetes. Pt has a better understanding of his diabetes and feels his is able to manager it "a lot better". No request or inquires today as pt informed to continue managing his diabetes. RN offered pt a health coach to continue telephonic disease management however pt prefers to receive another call from this RN case manager. Will follow up next month on pt's progress.  Raina Mina, RN Care Management Coordinator [place] Office [phone]

## 2017-09-30 NOTE — Telephone Encounter (Signed)
REFILL 

## 2017-10-01 NOTE — Telephone Encounter (Signed)
Could we have him come in to check over medications and discuss his DM?

## 2017-10-06 ENCOUNTER — Other Ambulatory Visit: Payer: Self-pay | Admitting: Family Medicine

## 2017-10-06 NOTE — Progress Notes (Signed)
Device check to lower rate from 80bpm to 70bpm per SK note from 09/11/17. ROV w/ SK 12/16/2017.

## 2017-10-07 ENCOUNTER — Ambulatory Visit (INDEPENDENT_AMBULATORY_CARE_PROVIDER_SITE_OTHER): Payer: Medicare Other | Admitting: Physician Assistant

## 2017-10-07 ENCOUNTER — Encounter: Payer: Self-pay | Admitting: Physician Assistant

## 2017-10-07 VITALS — BP 146/78 | HR 98 | Ht 73.0 in | Wt 241.0 lb

## 2017-10-07 DIAGNOSIS — I5022 Chronic systolic (congestive) heart failure: Secondary | ICD-10-CM

## 2017-10-07 DIAGNOSIS — I428 Other cardiomyopathies: Secondary | ICD-10-CM | POA: Diagnosis not present

## 2017-10-07 DIAGNOSIS — N183 Chronic kidney disease, stage 3 (moderate): Secondary | ICD-10-CM | POA: Diagnosis not present

## 2017-10-07 DIAGNOSIS — I10 Essential (primary) hypertension: Secondary | ICD-10-CM | POA: Diagnosis not present

## 2017-10-07 DIAGNOSIS — E1122 Type 2 diabetes mellitus with diabetic chronic kidney disease: Secondary | ICD-10-CM

## 2017-10-07 DIAGNOSIS — Z95810 Presence of automatic (implantable) cardiac defibrillator: Secondary | ICD-10-CM | POA: Diagnosis not present

## 2017-10-07 DIAGNOSIS — I482 Chronic atrial fibrillation: Secondary | ICD-10-CM | POA: Diagnosis not present

## 2017-10-07 DIAGNOSIS — I4821 Permanent atrial fibrillation: Secondary | ICD-10-CM

## 2017-10-07 NOTE — Patient Instructions (Signed)
Medication Instructions:  Your physician recommends that you continue on your current medications as directed. Please refer to the Current Medication list given to you today.  Labwork: NONE today  Testing/Procedures: NONE today  Follow-Up: Your physician recommends that you schedule a follow-up appointment in: 2 months with Dr. Meda Coffee.  If you need a refill on your cardiac medications before your next appointment, please call your pharmacy.

## 2017-10-07 NOTE — Progress Notes (Signed)
Cardiology Office Note    Date:  10/07/2017   ID:  [name], [name] 04-27-1951, MRN [id number]  PCP:  Sela Hilding, MD  Cardiologist: Ena Dawley, MD EPS Dr. Caryl Comes  Chief Complaint  Patient presents with  . Follow-up    History of Present Illness:  Anthony [name] is a 67 y.o. male with history of atrial fibrillation with RVR status post ablation and ICD with CRT implant 08/20/2017,  CHA2DS2-VASc equals 5 on Eliquis CAD status post stenting to the circumflex OM 06/1999, low risk Myoview 7341, chronic systolic and diastolic CHF, hypertension, HLD, DM, obesity,  Patient saw Rosaria Ferries, PA-C 09/03/2017 with acute on chronic systolic and diastolic CHF.  He was having trouble with his renal function so she continued his current dose of Lasix.  Patient saw Dr. Caryl Comes 09/11/2017 and pacer was reprogrammed to rate from 90 and was reprogrammed  2 weeks later to 70.  Last creatinine 09/25/2017 was 2.49.  Patient comes in today for follow-up.  Overall he is feeling well and has had no recent problems with heart failure.  Watching his diet closely.  Continues to smoke 5 or 6 cigarettes daily.  Patient states he can walk much further than he was able to in the past.  Dyspnea on exertion is greatly improved.  Past Medical History:  Diagnosis Date  . Arthritis   . CAD in native artery    a. reported MI 2000, 2001, s/p stenting of the circumflex lesion extending into the second obtuse marginal branch with a drug-eluting stent in 2003. b. low risk nuc 2015.  Marland Kitchen Chronic atrial fibrillation ([place])   . Chronic combined systolic and diastolic CHF (congestive heart failure) ([place])    a. Previously diastolic, then EF 93-79% in 10/2016.  . CKD (chronic kidney disease), stage III ([place])   . Depression   . Diabetes mellitus   . Edema of extremities   . Erectile dysfunction   . GERD (gastroesophageal reflux disease)   . Hyperlipidemia   . Hypertension   . Myocardial infarction ([place]) 2000&2001  .  Obesity   . Onychomycosis   . OSA (obstructive sleep apnea)   . S/P ICD (internal cardiac defibrillator) procedure and BiV device,  07/25/17 Medtronic  07/26/2017  . Seizures ([place])   . Tubular adenoma of colon 10/2002    Past Surgical History:  Procedure Laterality Date  . ANGIOPLASTY  2001   stent x 1  . AV NODE ABLATION N/A 08/20/2017   Procedure: AV NODE ABLATION;  Surgeon: Deboraha Sprang, MD;  Location: [facility];  Service: Cardiovascular;  Laterality: N/A;  . BIV ICD INSERTION CRT-D N/A 07/25/2017   Procedure: BIV ICD INSERTION CRT-D;  Surgeon: Deboraha Sprang, MD;  Location: [facility];  Service: Cardiovascular;  Laterality: N/A;  . COLONOSCOPY  2000?   negative  . FOOT ARTHROTOMY Right   . TOE AMPUTATION Left 2012    Current Medications: Current Meds  Medication Sig  . acetaminophen (TYLENOL) 325 MG tablet Take 1-2 tablets (325-650 mg total) by mouth every 4 (four) hours as needed for mild pain.  Marland Kitchen apixaban (ELIQUIS) 5 MG TABS tablet Take 1 tablet (5 mg total) by mouth 2 (two) times daily.  . calcitRIOL (ROCALTROL) 0.25 MCG capsule TAKE 0.25 MG BY MOUTH daily  . furosemide (LASIX) 40 MG tablet Take 1 tablet (40 mg total) by mouth 2 (two) times daily.  Marland Kitchen gabapentin (NEURONTIN) 300 MG capsule TAKE ONE CAPSULE BY  MOUTH AT BEDTIME (Patient taking differently: TAKE 300 MG BY MOUTH AT BEDTIME)  . HUMULIN 70/30 KWIKPEN (70-30) 100 UNIT/ML PEN INJECT 55 UNITS INTO THE SKIN DAILY.  . hydrALAZINE (APRESOLINE) 25 MG tablet Take 1 tablet (25 mg total) by mouth 3 (three) times daily.  . Insulin Pen Needle (PEN NEEDLES) 32G X 6 MM MISC 1 Units by Does not apply route as needed.  . labetalol (NORMODYNE) 200 MG tablet TAKE 2 TABLETS (400 MG TOTAL) BY MOUTH 2 (TWO) TIMES DAILY.  . magnesium oxide (MAG-OX) 400 (241.3 Mg) MG tablet Take 0.5 tablets (200 mg total) by mouth daily. (Patient taking differently: Take 400 mg by mouth daily. )  . Multiple Vitamin (MULTIVITAMIN) tablet Take  1 tablet by mouth daily.    . nitroGLYCERIN (NITROSTAT) 0.4 MG SL tablet Place 0.4 mg under the tongue every 5 (five) minutes as needed for chest pain. If no relief by 3rd tab, call 911   . Omega 3 1000 MG CAPS Take 2,000 mg by mouth daily.   Marland Kitchen omeprazole (PRILOSEC) 40 MG capsule TAKE ONE CAPSULE BY MOUTH DAILY  . oxymorphone (OPANA) 5 MG tablet Take 5 mg by mouth every 6 (six) hours as needed for pain (max 3 tablets daily). Per Dr Brien Few  . PARoxetine (PAXIL) 40 MG tablet Take 40 mg by mouth daily.  . polyethylene glycol (MIRALAX / GLYCOLAX) packet Take 17 g by mouth daily.  . potassium chloride SA (K-DUR,KLOR-CON) 20 MEQ tablet Take 1 tablet (20 mEq total) by mouth 2 (two) times daily.  . rosuvastatin (CRESTOR) 5 MG tablet TAKE 1 TABLET BY MOUTH DAILY.     Allergies:   Enalapril maleate   Social History   Socioeconomic History  . Marital status: Married    Spouse name: [name]  . Number of children: 2  . Years of education: GED  . Highest education level: Not on file  Occupational History  . Occupation: CUSTOMER SERVICES    Employer: [employer]    Comment: [employer]  Social Needs  . Financial resource strain: Not on file  . Food insecurity:    Worry: Not on file    Inability: Not on file  . Transportation needs:    Medical: Not on file    Non-medical: Not on file  Tobacco Use  . Smoking status: Former Smoker    Packs/day: 0.25    Years: 45.00    Pack years: 11.25    Types: Cigarettes    Start date: 05/28/1967    Last attempt to quit: 06/11/2016    Years since quitting: 1.3  . Smokeless tobacco: Never Used  Substance and Sexual Activity  . Alcohol use: No    Comment: quit in 1993  . Drug use: No  . Sexual activity: Not on file  Lifestyle  . Physical activity:    Days per week: Not on file    Minutes per session: Not on file  . Stress: Not on file  Relationships  . Social connections:    Talks on phone: Not on file    Gets together: Not on file    Attends religious  service: Not on file    Active member of club or organization: Not on file    Attends meetings of clubs or organizations: Not on file    Relationship status: Not on file  Other Topics Concern  . Not on file  Social History Narrative   Patient is right handed, consumes caffeine rarely. Patient resides in home with  wife.     Family History:  The patient's family history includes Heart attack in his father and mother.   ROS:   Please see the history of present illness.    Review of Systems  Constitution: Negative.  HENT: Negative.   Cardiovascular: Positive for dyspnea on exertion and leg swelling.  Respiratory: Negative.   Endocrine: Negative.   Hematologic/Lymphatic: Negative.   Musculoskeletal: Negative.   Gastrointestinal: Negative.   Genitourinary: Negative.   Neurological: Negative.    All other systems reviewed and are negative.   PHYSICAL EXAM:   VS:  BP (!) 146/78 (BP Location: Right Arm, Patient Position: Sitting, Cuff Size: Normal)   Pulse 98   Ht 6\' 1"  (1.854 m)   Wt 241 lb (109.3 kg)   SpO2 99%   BMI 31.80 kg/m   Physical Exam  GEN: Obese, in no acute distress  Neck: no JVD, carotid bruits, or masses Cardiac:RRR; 1/6 systolic murmur at the left sternal border Respiratory:  clear to auscultation bilaterally, normal work of breathing GI: soft, nontender, nondistended, + BS Ext: Trace of edema bilaterally without cyanosis, clubbing, Good distal pulses bilaterally Neuro:  Alert and Oriented x 3, Psych: euthymic mood, full affect  Wt Readings from Last 3 Encounters:  10/07/17 241 lb (109.3 kg)  09/11/17 241 lb 1.6 oz (109.4 kg)  09/02/17 237 lb 6.4 oz (107.7 kg)      Studies/Labs Reviewed:   EKG:  EKG is  ordered today.  The ekg ordered today demonstrates paced rhythm at 81 bpm looks like underlying atrial fibrillation  Recent Labs: 11/21/2016: NT-Pro BNP 930 11/26/2016: TSH 1.320 06/12/2017: ALT 34 06/19/2017: Magnesium 1.8 08/08/2017: Hemoglobin 14.2;  Platelets 139 09/25/2017: BUN 17; Creatinine, Ser 2.49; Potassium 4.2; Sodium 141   Lipid Panel    Component Value Date/Time   CHOL 108 12/24/2016 1313   TRIG 89 12/24/2016 1313   HDL 35 (L) 12/24/2016 1313   CHOLHDL 3.1 12/24/2016 1313   CHOLHDL 3.2 02/28/2016 0906   VLDL 19 02/28/2016 0906   LDLCALC 55 12/24/2016 1313   LDLDIRECT 58 11/06/2011 1434    Additional studies/ records that were reviewed today include:    ECHO: 11/21/2016 - Left ventricle: The cavity size was normal. There was severe   concentric hypertrophy. Systolic function was severely reduced.   The estimated ejection fraction was in the range of 25% to 30%.   Diffuse hypokinesis. - Left atrium: The atrium was severely dilated. - Right ventricle: Systolic function was moderately reduced. - Right atrium: The atrium was moderately dilated. - Tricuspid valve: There was trivial regurgitation. - Pulmonary arteries: Systolic pressure was at the upper limits of   normal. - Inferior vena cava: The vessel was normal in size. - Pericardium, extracardiac: There was no pericardial effusion.  Impressions:  - When compared to the prior study from 02/23/2014, LVEF has   decreased from 55-60% to 25-30% with diffuse hypokinesis, RVEF is   moderately decreased. This is possibly tachycardia mediated, the   study was acquired while patient in atrial fibrillation with RVR.   CATH: 03/19/2002 DIAGNOSTIC FINDINGS:  1. Left main:  Angiographically normal.  2. LAD:  The LAD is a large vessel giving rise to two diagonal branches.  It     is angiographically normal.  3. Circumflex:  The circumflex is a large vessel giving rise to two obtuse     marginal branches.  There is a 60-70% stenosis of the mid circumflex     extending into  the proximal portion of the second obtuse marginal branch.  4. RCA:  The RCA is a large vessel.  There is a 20% stenosis of the mid     vessel.  5. LV:  EF equals 65% without regional wall motion  abnormality.   PERCUTANEOUS CORONARY INTERVENTION TECHNIQUE:  The lesion of the circumflex  was directly stented with a 3.0 x 18 mm CYPHER stent  deployed at 14 atmospheres.  The mid and distal portions of the stent were  post-dilated using a 3.25 x 12 mm Quantum balloon at 14 atmospheres.  The  proximal portion of the stent was post-dilated using this balloon at 16  atmospheres.    IMPRESSION/PLAN:  Successful stenting of the circumflex lesion extending at  a second obtuse marginal branch using a drug-eluting stent.        ASSESSMENT:    1. Congestive heart failure, NYHA class 3, chronic, systolic ([place])   2. HYPERTENSION, BENIGN SYSTEMIC   3. NICM (nonischemic cardiomyopathy) ([place])   4. Permanent atrial fibrillation ([place])   5. S/P ICD (internal cardiac defibrillator) procedure and BiV device,  07/25/17 Medtronic    6. CKD stage 3 due to type 2 diabetes mellitus (HCC)      PLAN:  In order of problems listed above:  Chronic systolic and diastolic CHF LVEF 25 to 65% on most recent echo 11/21/2016.  Heart failure, currently compensated.  Renal function on 09/25/2017 stable at 2.49.  Will not adjust diuretics at this time.  Not on ACE or ARB because of renal function.  Prescription for compression hose given for lower extremity edema.  Has not been able to afford in the past and will go to [facility] to get stockings.  Nonischemic cardiomyopathy EF 25 to 30% currently compensated  Hypertension blood pressure controlled on labetalol and hydralazine  Permanent atrial fibrillation status post ICD reprogrammed by Dr. Caryl Comes last month  Status post ICD by Dr. Caryl Comes.  CKD notes from Dr. Lorrene Reid from 09/26/2017 recommends will need to accept some degree of worsening renal function in order to keep him out of heart failure.    Medication Adjustments/Labs and Tests Ordered: Current medicines are reviewed at length with the patient today.  Concerns regarding medicines are outlined above.   Medication changes, Labs and Tests ordered today are listed in the Patient Instructions below. Patient Instructions  Medication Instructions:  Your physician recommends that you continue on your current medications as directed. Please refer to the Current Medication list given to you today.  Labwork: NONE today  Testing/Procedures: NONE today  Follow-Up: Your physician recommends that you schedule a follow-up appointment in: 2 months with Dr. Meda Coffee.  If you need a refill on your cardiac medications before your next appointment, please call your pharmacy.      Signed, Ermalinda Barrios, PA-C  10/07/2017 2:16 PM    [facility] [address], [place], [place]  [address] Phone: [phone]; Fax: [phone]

## 2017-10-15 ENCOUNTER — Encounter: Payer: Medicare Other | Admitting: Internal Medicine

## 2017-10-22 ENCOUNTER — Other Ambulatory Visit: Payer: Self-pay | Admitting: Cardiology

## 2017-10-29 ENCOUNTER — Ambulatory Visit (INDEPENDENT_AMBULATORY_CARE_PROVIDER_SITE_OTHER): Payer: Medicare Other | Admitting: Family Medicine

## 2017-10-29 ENCOUNTER — Encounter: Payer: Self-pay | Admitting: Family Medicine

## 2017-10-29 VITALS — BP 140/80 | HR 87 | Temp 98.6°F | Ht 73.0 in | Wt 231.8 lb

## 2017-10-29 DIAGNOSIS — E119 Type 2 diabetes mellitus without complications: Secondary | ICD-10-CM | POA: Diagnosis not present

## 2017-10-29 DIAGNOSIS — I482 Chronic atrial fibrillation, unspecified: Secondary | ICD-10-CM

## 2017-10-29 DIAGNOSIS — IMO0001 Reserved for inherently not codable concepts without codable children: Secondary | ICD-10-CM

## 2017-10-29 DIAGNOSIS — Z794 Long term (current) use of insulin: Secondary | ICD-10-CM | POA: Diagnosis not present

## 2017-10-29 LAB — POCT UA - MICROALBUMIN
Albumin/Creatinine Ratio, Urine, POC: >300
Creatinine, POC: 50 mg/dL
Microalbumin Ur, POC: 150 mg/L

## 2017-10-29 MED ORDER — SEMAGLUTIDE(0.25 OR 0.5MG/DOS) 2 MG/1.5ML ~~LOC~~ SOPN: 0.2500 mg | PEN_INJECTOR | SUBCUTANEOUS | 0 refills | Status: DC

## 2017-10-29 NOTE — Progress Notes (Signed)
   CC: DM  HPI  Forgot his CBG book, his friend was telling him that he takes ozempic. Has changed some eating habits since he had [facility] RNs checking on him. Using lantus 55U daily. Denies lows. Checks sugar BID to TID.   Afib- he feels much improved after rate control.   ROS: Denies CP, SOB, abdominal pain, dysuria, changes in BMs.   CC, SH/smoking status, and VS noted  Objective: BP 140/80 (BP Location: Left Arm, Patient Position: Sitting, Cuff Size: Large)   Pulse 87   Temp 98.6 F (37 C) (Oral)   Ht 6\' 1"  (1.854 m)   Wt 231 lb 12.8 oz (105.1 kg)   SpO2 99%   BMI 30.58 kg/m  Gen: NAD, alert, cooperative, and pleasant. HEENT: NCAT, EOMI, PERRL CV: irregular rhythm, no murmur Resp: CTAB, no wheezes, non-labored Ext: No edema, warm Neuro: Alert and oriented, Speech clear, No gross deficits  Assessment and plan:  Chronic atrial fibrillation (HCC) Seeing cards, feels much improved w increased exercise tolerance (mostly walking) after achieving rate control. Continue current meds.   IDDM (insulin dependent diabetes mellitus) ([place]) Add ozempic, samples given today. Given poor control recently, will continue current lantus dosing. Patient to call if low, will reduce lantus. Recheck a1c 12/02/17 or after. Follow up 1 month w me to recheck a1c and reassess CBGs.   Orders Placed This Encounter  Procedures  . POCT UA - Microalbumin    Meds ordered this encounter  Medications  . Semaglutide (OZEMPIC) 0.25 or 0.5 MG/DOSE SOPN    Sig: Inject 0.25 mg into the skin once a week.    Dispense:  1 pen    Refill:  0    Order Specific Question:   Lot Number?    Answer:   [id number]    Order Specific Question:   Expiration Date?    Answer:   05/27/2019    Order Specific Question:   Manufacturer?    Answer:   [employer] [[id number]]    Comments:   NOVO [place]    Order Specific Question:   Quantity    Answer:   1     Ralene Ok, MD, PGY2 10/31/2017 1:30 PM

## 2017-10-29 NOTE — Patient Instructions (Addendum)
It was a pleasure to see you today! Thank you for choosing [facility] for your primary care. Anthony [name] was seen for diabetes.   Our plans for today were:  You can try the ozempic 0.25mg  injection once WEEKLY for 4 weeks. If you feel funny, check your blood sugar to make sure it isn't too low. If it gets low, call us.   You should return to our clinic to see Dr. Lindell Noe in 1 month for diabetes.   Best,  Dr. Lindell Noe

## 2017-10-31 ENCOUNTER — Other Ambulatory Visit: Payer: Self-pay | Admitting: *Deleted

## 2017-10-31 ENCOUNTER — Encounter: Payer: Medicare Other | Admitting: Internal Medicine

## 2017-10-31 ENCOUNTER — Ambulatory Visit (INDEPENDENT_AMBULATORY_CARE_PROVIDER_SITE_OTHER): Payer: Medicare Other | Admitting: Podiatry

## 2017-10-31 ENCOUNTER — Encounter: Payer: Self-pay | Admitting: Podiatry

## 2017-10-31 DIAGNOSIS — B351 Tinea unguium: Secondary | ICD-10-CM

## 2017-10-31 DIAGNOSIS — E119 Type 2 diabetes mellitus without complications: Secondary | ICD-10-CM | POA: Diagnosis not present

## 2017-10-31 DIAGNOSIS — Q6650 Congenital pes planus, unspecified foot: Secondary | ICD-10-CM | POA: Diagnosis not present

## 2017-10-31 DIAGNOSIS — M79609 Pain in unspecified limb: Secondary | ICD-10-CM | POA: Diagnosis not present

## 2017-10-31 NOTE — Progress Notes (Signed)
Patient ID: Anthony [name], male   DOB: 01/31/1951, 67 y.o.   MRN: [id number] Complaint:  Visit Type: Patient returns to my office for continued preventative foot care services. Complaint: Patient states" my nails have grown long and thick and become painful to walk and wear shoes" Patient has been diagnosed with DM with neuropathy.. The patient presents for preventative foot care services. No changes to ROS.  Patient has had health problems so he has not been seen in over 16 months.  Podiatric Exam: Vascular: dorsalis pedis and posterior tibial pulses are palpable bilateral. Capillary return is immediate. Temperature gradient is WNL. Skin turgor WNL  Sensorium: Normal Semmes Weinstein monofilament test. Normal tactile sensation bilaterally. Nail Exam: Pt has thick disfigured discolored nails with subungual debris noted bilateral entire nail hallux through fifth toenails Ulcer Exam: There is no evidence of ulcer or pre-ulcerative changes or infection. Orthopedic Exam: Muscle tone and strength are WNL. No limitations in general ROM. No crepitus or effusions noted. HAV  B/L.  Pes planus. Amputation second toe left foot. Skin: No Porokeratosis. No infection or ulcers  Diagnosis:  Onychomycosis, , Pain in right toe, pain in left toes  Treatment & Plan Procedures and Treatment: Consent by patient was obtained for treatment procedures. The patient understood the discussion of treatment and procedures well. All questions were answered thoroughly reviewed. Debridement of mycotic and hypertrophic toenails, 1 through 5 bilateral and clearing of subungual debris. No ulceration, no infection noted.  Return Visit-Office Procedure: Patient instructed to return to the office for a follow up visit 3 months for continued evaluation and treatment.   Gardiner Barefoot DPM

## 2017-10-31 NOTE — Patient Outreach (Signed)
[facility]) Care Management  10/31/2017  Anthony [name] May 15, 1951 [id number]    Telephone Assessment  RN spoke with pt today and review all goals and recent interventions related to pt's diabetes and HF. Discussed pt's plan of care related to his diabetes as reported today CBG was 109 and yesterday 190. Pt states he is eating better and managing this condition. Pt also states he weight was 239 lbs and has been stable over the last few days with no signs or symptoms of fluid retention. RN verified pt remains in the GREEN zone with no precipitating symptoms.  All interventions and goal reviewed and discussed with adjusted interventions based upon pt's progress. Will also generated more education on pt's HF and discuss referral for ongoing education with a health coach for ongoing disease management of care (pt receptive). Will alert provider of pt's disposition with [facility] community and refer to a health coach for ongoing monitoring of pt's DM/HF. Pt very appreciative for the follow up calls over the last several month. No additional needs or issues to address at this time.  Raina Mina, RN Care Management Coordinator [place] Office [phone]

## 2017-10-31 NOTE — Assessment & Plan Note (Signed)
Add ozempic, samples given today. Given poor control recently, will continue current lantus dosing. Patient to call if low, will reduce lantus. Recheck a1c 12/02/17 or after. Follow up 1 month w me to recheck a1c and reassess CBGs.

## 2017-10-31 NOTE — Assessment & Plan Note (Signed)
Seeing cards, feels much improved w increased exercise tolerance (mostly walking) after achieving rate control. Continue current meds.

## 2017-11-03 ENCOUNTER — Encounter: Payer: Self-pay | Admitting: *Deleted

## 2017-11-05 ENCOUNTER — Other Ambulatory Visit: Payer: Self-pay | Admitting: Cardiology

## 2017-11-08 ENCOUNTER — Other Ambulatory Visit: Payer: Self-pay | Admitting: Family Medicine

## 2017-11-10 ENCOUNTER — Other Ambulatory Visit: Payer: Self-pay | Admitting: Family Medicine

## 2017-11-14 ENCOUNTER — Telehealth: Payer: Self-pay | Admitting: Internal Medicine

## 2017-11-14 MED ORDER — FUROSEMIDE 40 MG PO TABS: 40.0000 mg | ORAL_TABLET | Freq: Two times a day (BID) | ORAL | 3 refills | Status: DC

## 2017-11-14 NOTE — Telephone Encounter (Signed)
Spoke with pt who need a refill for lasix. Otherwise, pt states he is doing well and has not had any weight gains. I encouraged him to keep his follow up appointment with his renal and heart doctors. He had no additional questions or concerns.

## 2017-11-14 NOTE — Telephone Encounter (Signed)
New Message   Pt c/o medication issue:  1. Name of Medication: furosemide (LASIX) 40 MG tablet  2. How are you currently taking this medication (dosage and times per day)?  3. Are you having a reaction (difficulty breathing--STAT)?   4. What is your medication issue? Patient is calling because he needs to clarify how he should be taking the lasix

## 2017-11-17 ENCOUNTER — Other Ambulatory Visit: Payer: Self-pay | Admitting: *Deleted

## 2017-11-17 NOTE — Patient Outreach (Signed)
[facility]) Care Management  11/17/2017  Anthony [name] 12-12-1950 [id number]  RN Health Coach attempted #[phone] up outreach call to patient.  Patient was unavailable. HIPPA compliance voicemail message left with return callback number.  Plan: RN will call patient again within 3-5 business days [facility] [phone]

## 2017-11-19 ENCOUNTER — Encounter: Payer: Self-pay | Admitting: *Deleted

## 2017-11-19 ENCOUNTER — Other Ambulatory Visit: Payer: Self-pay | Admitting: *Deleted

## 2017-11-19 NOTE — Patient Outreach (Signed)
[facility]) Care Management  11/19/2017   NOLIN GRELL [birth year] [id number]  RN Health Coach telephone call to patient.  Hipaa compliance verified. Per patient he is doing pretty good. Patient had not checked his blood sugar this am. Per patient his fingers are a little sore so he takes a few days of checking his blood sugar. Per patient he also had not weighed himself today. Patient stated he stays between 236-239 pounds. RN discussed the importance of checking blood sugar and weight. Patient stated that he gives his insulin in his abdomen only. Patient stated that his insulin is pricey and hard to afford. Patient was able to tell his signs and symptoms of hypo and hyperglycemia. Patient has agreed to further outreach calls.  Current Medications:  Current Outpatient Medications  Medication Sig Dispense Refill  . acetaminophen (TYLENOL) 325 MG tablet Take 1-2 tablets (325-650 mg total) by mouth every 4 (four) hours as needed for mild pain.    Marland Kitchen apixaban (ELIQUIS) 5 MG TABS tablet Take 1 tablet (5 mg total) by mouth 2 (two) times daily. 60 tablet 11  . calcitRIOL (ROCALTROL) 0.25 MCG capsule TAKE 0.25 MG BY MOUTH daily  5  . furosemide (LASIX) 40 MG tablet Take 1 tablet (40 mg total) by mouth 2 (two) times daily. 180 tablet 3  . gabapentin (NEURONTIN) 300 MG capsule TAKE ONE CAPSULE BY MOUTH EVERY NIGHT AT BEDTIME 90 capsule 0  . HUMULIN 70/30 KWIKPEN (70-30) 100 UNIT/ML PEN INJECT 55 UNITS INTO THE SKIN DAILY. 15 pen 2  . hydrALAZINE (APRESOLINE) 25 MG tablet Take 1 tablet (25 mg total) by mouth 3 (three) times daily. 90 tablet 11  . Insulin Pen Needle (PEN NEEDLES) 32G X 6 MM MISC 1 Units by Does not apply route as needed. 100 each 11  . KLOR-CON M20 20 MEQ tablet TAKE 1 TABLET BY MOUTH TWICE A DAY 180 tablet 2  . labetalol (NORMODYNE) 200 MG tablet TAKE 2 TABLETS (400 MG TOTAL) BY MOUTH 2 (TWO) TIMES DAILY. 120 tablet 11  . magnesium oxide (MAG-OX) 400 (241.3 Mg) MG  tablet Take 0.5 tablets (200 mg total) by mouth daily. (Patient taking differently: Take 400 mg by mouth daily. ) 30 tablet 0  . Multiple Vitamin (MULTIVITAMIN) tablet Take 1 tablet by mouth daily.      . nitroGLYCERIN (NITROSTAT) 0.4 MG SL tablet Place 0.4 mg under the tongue every 5 (five) minutes as needed for chest pain. If no relief by 3rd tab, call 911     . Omega 3 1000 MG CAPS Take 2,000 mg by mouth daily.     Marland Kitchen omeprazole (PRILOSEC) 40 MG capsule TAKE ONE CAPSULE BY MOUTH DAILY 30 capsule 0  . oxymorphone (OPANA) 5 MG tablet Take 5 mg by mouth every 6 (six) hours as needed for pain (max 3 tablets daily). Per Dr Brien Few    . PARoxetine (PAXIL) 40 MG tablet Take 40 mg by mouth daily.    . polyethylene glycol (MIRALAX / GLYCOLAX) packet Take 17 g by mouth daily. 14 each 0  . rosuvastatin (CRESTOR) 5 MG tablet TAKE 1 TABLET BY MOUTH DAILY. 30 tablet 2  . Semaglutide (OZEMPIC) 0.25 or 0.5 MG/DOSE SOPN Inject 0.25 mg into the skin once a week. 1 pen 0   No current facility-administered medications for this visit.     Functional Status:  In your present state of health, do you have any difficulty performing the following activities: 11/19/2017 08/11/2017  Hearing?  N N  Vision? N N  Difficulty concentrating or making decisions? N N  Walking or climbing stairs? N N  Dressing or bathing? N N  Doing errands, shopping? N Y  Conservation officer, nature and eating ? N N  Using the Toilet? N N  In the past six months, have you accidently leaked urine? N N  Do you have problems with loss of bowel control? N N  Managing your Medications? N N  Managing your Finances? N N  Housekeeping or managing your Housekeeping? N N  Some recent data might be hidden    Fall/Depression Screening: Fall Risk  11/19/2017 09/02/2017 07/28/2017  Falls in the past year? Yes No Yes  Number falls in past yr: 1 - 1  Injury with Fall? Yes - No  Risk for fall due to : - - -  Risk for fall due to: Comment - - -  Follow up Falls  evaluation completed - Falls prevention discussed  Comment - - pt had seizure-stroke   PHQ 2/9 Scores 11/19/2017 09/02/2017 07/28/2017 07/24/2017 02/12/2017 01/17/2017 11/07/2016  PHQ - 2 Score 0 0 0 0 0 0 0  PHQ- 9 Score - - - - - - -  Exception Documentation - - - - - - -  Not completed - - - - - - -     THN CM Care Plan Problem One     Most Recent Value  Care Plan Problem One  Knowlwdge Deficit in self management of Diabetes  Role Documenting the Problem One  [place] for Problem One  Active  THN Long Term Goal   Patient will check his blood sugars and record as per physician order   [place] Term Goal Start Date  11/19/17  Interventions for Problem One Long Term Goal  RN discussed the importance of checking blood sugar. RN discussed how the blood sugars affect the A1C. RN sent a patient living well with diabetes bookle.  THN CM Short Term Goal #1   Patient will be able to verbalize other areas to give his insulin within the next 30 days  THN CM Short Term Goal #1 Start Date  11/19/17  Interventions for Short Term Goal #1  RN discussed different sites to give insulin. RN sent patient educational information with pictures for insulin injection sites. RN will follow up with further discussion  THN CM Short Term Goal #2   patient will be able to verbalize healthy menu choices from fast food restaurants within the next 30 days  THN CM Short Term Goal #2 Start Date  11/19/17  Interventions for Short Term Goal #2  RN discussed fast food eating. RN sent patient a list of menu choices for a diabetic from fast food restaurants, RN will follow up with further discussion and teach back  THN CM Short Term Goal #3  Patient will verbalize weighing and documenting within the next 30 days  THN CM Short Term Goal #3 Start Date  11/19/17  Interventions for Short Tern Goal #3  RN discussed doing daily weights. RN sent patient a 2019 calendar book to document weights and blood sugars in. RN will  follow up with further discussion        Assessment:  Patient is not checking his blood sugars daily Patient is not weighing daily Patient is administering insulin in abdomen only Patient will benefit from [place] telephonic outreach for education and support for diabetes and congestive Heart Failure self management.  Plan:  RN discussed changing insulin sites Rn sent educational material on changing insulin administration  sites RN discussed checking blood sugars as per physician order RN discussed daily weights RN sent a 2019 Calendar book RN sent living well with diabetes book RN sent fast food menus Referral to pharmacy RN will follow up outreach within the month of July  Raphael Espe Refugio BSN RN [facility] [facility] [phone]

## 2017-11-20 ENCOUNTER — Other Ambulatory Visit: Payer: Self-pay | Admitting: Cardiology

## 2017-11-20 NOTE — Telephone Encounter (Signed)
Outpatient Medication Detail    Disp Refills Start End   furosemide (LASIX) 40 MG tablet 180 tablet 3 11/14/2017 02/12/2018   Sig - Route: Take 1 tablet (40 mg total) by mouth 2 (two) times daily. - Oral   Sent to pharmacy as: furosemide (LASIX) 40 MG tablet   E-Prescribing Status: Receipt confirmed by pharmacy (11/14/2017 4:01 PM EDT)   Pharmacy   [id number] - [place], [place] - [address]

## 2017-11-24 ENCOUNTER — Other Ambulatory Visit: Payer: Self-pay

## 2017-11-24 NOTE — Patient Outreach (Signed)
[facility]) Care Management  11/24/2017  BASEM YANNUZZI April 15, 1951 [id number]  67 year old male referred to [place] Management.  [place] services requested for medication assistance for Humulin 70/30, Semaglutide and Eliquis.  PMHx includes, but not limited to, hypertension, atrial fibrillation, GERD, Type 2 diabetes mellitus, Stage 3 CKD, diabetic retinopathy, hypercholesterolemia and seizures.  Successful outreach attempt to [name].  HIPAA identifiers verified.  Subjective: Mr. [place] reports that he pays about $80/month for his Eliquis and Humulin 70/30.  He states that he a sample of semaglutide and that he currently has about 2 weeks left.  Medication Assistance: Per financial discussion, Mr. [place] does not qualify for Extra Help LIS.  Per [employer], Mr. Petite has spent $2314.04 out of pocket (OOP) on prescription medications this year.  To qualify for semaglutide patient assistance from [employer] he will need to spend $1000 OOP.   Eliquis through [employer] requires an OOP of 3% of household income which is estimated at ~ $1000.  The Humulin from [place] requires an OOP of $1100.   It would be optimal if PCP will allow switch to Novolin 70/30 and then [place] could apply to insulin and Ozempic through one application with [employer].  Informed patient that he can get Relion 70/30 insulin at [facility] for $25/bottle while waiting on patient assistance applications to be processed.   Plan:  Route note to CPhT, Etter Sjogren to ask her to begin patient assistance applications for Eliquis, Ozempic and insulin 70/30.  Route note to PCP, Dr. Lindell Noe.  Can patient switch to Novolin 70/30?   Joetta Manners, PharmD Clinical Pharmacist [place] [phone]

## 2017-11-26 ENCOUNTER — Other Ambulatory Visit: Payer: Self-pay

## 2017-11-26 NOTE — Patient Outreach (Addendum)
[facility]) Care Management  11/26/2017  Anthony [name] July 04, 1950 [id number]  67 year old male referred to [place] [facility].  [place] services requested for medication assistance for Humulin 70/30, Semaglutide and Eliquis.  PMHx includes, but not limited to, hypertension, atrial fibrillation, GERD, Type 2 diabetes mellitus, Stage 3 CKD, diabetic retinopathy, hypercholesterolemia and seizures.  Successful outreach attempt to [name].  HIPAA identifiers verified.  Medication Assistance: Due to high prescription out of pocket (OOP) reported to me by Anthony [name]'s insurance, patient was asked to get a printout pf his medication expenditures from his pharmacy.  Based on pharmacy records he has spent ~ $796 OOP.  His pharmacy states that he is not in the donut hole and test claim reveals he can get Ozempic for $40/month.  Patient states that he will call his PCP and request a new script, as he is almost out of his sample.   [place] requested that Anthony [name] get a pharmacy printout of his expenditures every time he gets a refill and to contact us when he approaches the $1000 OOP eligibility requirement from [employer] for his Ozempic.  Patient verbalized understanding.    Plan: Hold on patient assistance application until he is closer to the $1000 OOP requirement.   Patient has my phone number to all me at that time.   Will close patient case until I hear from [name].   Joetta Manners, PharmD Clinical Pharmacist [place] [phone]

## 2017-11-28 MED ORDER — SEMAGLUTIDE(0.25 OR 0.5MG/DOS) 2 MG/1.5ML ~~LOC~~ SOPN: 0.2500 mg | PEN_INJECTOR | SUBCUTANEOUS | 2 refills | Status: DC

## 2017-11-28 NOTE — Telephone Encounter (Signed)
White team, please call patient. I am sending ozempic script in, but he needs to see me sometime this month to review his CBGs and recheck a1c. Please ask him to bring meter to appt.

## 2017-11-28 NOTE — Telephone Encounter (Signed)
Left message to call office back to inform him of below and also to assist in getting him an appointment.  If he calls back please give him the below message and get him an appointment scheduled. Katharina Caper, [place] Coran D, [place]

## 2017-12-01 ENCOUNTER — Ambulatory Visit: Payer: Medicare Other | Admitting: Family Medicine

## 2017-12-01 ENCOUNTER — Encounter: Payer: Self-pay | Admitting: Family Medicine

## 2017-12-01 ENCOUNTER — Telehealth: Payer: Self-pay | Admitting: Internal Medicine

## 2017-12-01 ENCOUNTER — Other Ambulatory Visit: Payer: Self-pay

## 2017-12-01 VITALS — BP 122/72 | HR 80 | Temp 98.9°F | Ht 73.0 in | Wt 244.8 lb

## 2017-12-01 DIAGNOSIS — E119 Type 2 diabetes mellitus without complications: Secondary | ICD-10-CM | POA: Diagnosis not present

## 2017-12-01 DIAGNOSIS — IMO0001 Reserved for inherently not codable concepts without codable children: Secondary | ICD-10-CM

## 2017-12-01 DIAGNOSIS — Z794 Long term (current) use of insulin: Secondary | ICD-10-CM

## 2017-12-01 LAB — POCT GLYCOSYLATED HEMOGLOBIN (HGB A1C): HbA1c, POC (controlled diabetic range): 7.2 % — AB (ref 0.0–7.0)

## 2017-12-01 NOTE — Patient Instructions (Addendum)
It was a pleasure to see you today! Thank you for choosing [facility] for your primary care. Anthony [name] was seen for diabetes.   Our plans for today were:  CONGRATULATIONS, YOUR DIABETES NUMBER IS AWESOME! 7.2. Keep the same plan for your medications.   You should return to our clinic to see Dr. Lindell Noe in 3 months for diabetes.   Best,  Dr. Lindell Noe

## 2017-12-01 NOTE — Telephone Encounter (Signed)
Pt has an appt today at 2:30 pm. Anthony [name], [place]

## 2017-12-01 NOTE — Telephone Encounter (Signed)
New Message   Pt wants to know if its safe for him to do overhead exercises with his pacemaker. Please call

## 2017-12-01 NOTE — Progress Notes (Signed)
   CC: DM   HPI  DM - had ozempic x 1 month. No side effects, denies hypoglycemia. Had some regular sodas over the last month, so he thinks his a1c might be high. Has been working with [facility] for help with coordination of care and DM education, he is pleased with this.   ROS: Denies CP, SOB, abdominal pain, dysuria, changes in BMs.   CC, SH/smoking status, and VS noted  Objective: BP 122/72   Pulse 80   Temp 98.9 F (37.2 C) (Oral)   Ht 6\' 1"  (1.854 m)   Wt 244 lb 12.8 oz (111 kg)   SpO2 98%   BMI 32.30 kg/m  Gen: NAD, alert, cooperative, and pleasant. HEENT: NCAT, EOMI, PERRL CV: RRR, no murmur Resp: CTAB, no wheezes, non-labored Ext: No edema, warm Neuro: Alert and oriented, Speech clear, No gross deficits  Assessment and plan:  IDDM (insulin dependent diabetes mellitus) ([place]) We are both thrilled with his improved a1c today without hypoglycemia or SEs.  Continue current regimen and recheck 3 months.    Orders Placed This Encounter  Procedures  . HgB A1c    No orders of the defined types were placed in this encounter.   Ralene Ok, MD, PGY3 12/05/2017 2:07 PM

## 2017-12-02 NOTE — Telephone Encounter (Signed)
LVM with pt stating he should have no movement restrictions due to his device at this time. He may call back with any additional questions.

## 2017-12-05 NOTE — Assessment & Plan Note (Signed)
We are both thrilled with his improved a1c today without hypoglycemia or SEs.  Continue current regimen and recheck 3 months.

## 2017-12-07 ENCOUNTER — Other Ambulatory Visit: Payer: Self-pay | Admitting: Family Medicine

## 2017-12-15 NOTE — Progress Notes (Deleted)
Cardiology Office Note    Date:  12/15/2017   ID:  Delford, Liechty 1951-05-14, MRN [id number]  PCP:  Sela Hilding, MD  Cardiologist: Ena Dawley, MD EPS Dr. Caryl Comes No chief complaint on file.   History of Present Illness:  Anthony [name] is a 67 y.o. male with history of atrial fibrillation with RVR status post ablation and ICD with CRT implant 08/20/2017, CHA2DS2-VASc equals 5 on Eliquis CAD status post stenting to the circumflex OM 06/1999, low risk Myoview [id number], chronic systolic and diastolic CHF, hypertension, HLD, DM, obesity.  Patient was having trouble with acute on chronic systolic and diastolic CHF back in April and Lasix was continued at same dose because of rising creatinine of 2.49.  Dr. Caryl Comes reprogram pacer to 18.  I saw the patient 10/07/2017 and he was feeling better without CHF.  He was watching his diet closely.  LVEF 25 to 30% on echo 11/21/2016.  Gave him a prescription for compression hose for lower extremity edema.     Past Medical History:  Diagnosis Date  . Arthritis   . CAD in native artery    a. reported MI 2000, 2001, s/p stenting of the circumflex lesion extending into the second obtuse marginal branch with a drug-eluting stent in 2003. b. low risk nuc 2015.  Marland Kitchen Chronic atrial fibrillation ([place])   . Chronic combined systolic and diastolic CHF (congestive heart failure) ([place])    a. Previously diastolic, then EF [id number] in 10/2016.  . CKD (chronic kidney disease), stage III ([place])   . Depression   . Diabetes mellitus   . Edema of extremities   . Erectile dysfunction   . GERD (gastroesophageal reflux disease)   . Hyperlipidemia   . Hypertension   . Myocardial infarction ([place]) 2000&2001  . Obesity   . Onychomycosis   . OSA (obstructive sleep apnea)   . S/P ICD (internal cardiac defibrillator) procedure and BiV device,  07/25/17 Medtronic  07/26/2017  . Seizures ([place])   . Tubular adenoma of colon 10/2002    Past Surgical History:  Procedure  Laterality Date  . ANGIOPLASTY  2001   stent x 1  . AV NODE ABLATION N/A 08/20/2017   Procedure: AV NODE ABLATION;  Surgeon: Deboraha Sprang, MD;  Location: [facility];  Service: Cardiovascular;  Laterality: N/A;  . BIV ICD INSERTION CRT-D N/A 07/25/2017   Procedure: BIV ICD INSERTION CRT-D;  Surgeon: Deboraha Sprang, MD;  Location: [facility];  Service: Cardiovascular;  Laterality: N/A;  . COLONOSCOPY  2000?   negative  . FOOT ARTHROTOMY Right   . TOE AMPUTATION Left 2012    Current Medications: No outpatient medications have been marked as taking for the 12/16/17 encounter (Appointment) with Imogene Burn, PA-C.     Allergies:   Enalapril maleate   Social History   Socioeconomic History  . Marital status: Married    Spouse name: [name]  . Number of children: 2  . Years of education: GED  . Highest education level: Not on file  Occupational History  . Occupation: CUSTOMER SERVICES    Employer: [employer]    Comment: [employer]  Social Needs  . Financial resource strain: Not on file  . Food insecurity:    Worry: Not on file    Inability: Not on file  . Transportation needs:    Medical: Not on file    Non-medical: Not on file  Tobacco Use  . Smoking status: Former Smoker  Packs/day: 0.25    Years: 45.00    Pack years: 11.25    Types: Cigarettes    Start date: 05/28/1967    Last attempt to quit: 06/11/2016    Years since quitting: 1.5  . Smokeless tobacco: Never Used  Substance and Sexual Activity  . Alcohol use: No    Comment: quit in 1993  . Drug use: No  . Sexual activity: Not on file  Lifestyle  . Physical activity:    Days per week: Not on file    Minutes per session: Not on file  . Stress: Not on file  Relationships  . Social connections:    Talks on phone: Not on file    Gets together: Not on file    Attends religious service: Not on file    Active member of club or organization: Not on file    Attends meetings of clubs or organizations: Not  on file    Relationship status: Not on file  Other Topics Concern  . Not on file  Social History Narrative   Patient is right handed, consumes caffeine rarely. Patient resides in home with wife.     Family History:  The patient's ***family history includes Heart attack in his father and mother.   ROS:   Please see the history of present illness.    ROS All other systems reviewed and are negative.   PHYSICAL EXAM:   VS:  There were no vitals taken for this visit.  Physical Exam  GEN: Well nourished, well developed, in no acute distress  HEENT: normal  Neck: no JVD, carotid bruits, or masses Cardiac:RRR; no murmurs, rubs, or gallops  Respiratory:  clear to auscultation bilaterally, normal work of breathing GI: soft, nontender, nondistended, + BS Ext: without cyanosis, clubbing, or edema, Good distal pulses bilaterally MS: no deformity or atrophy  Skin: warm and dry, no rash Neuro:  Alert and Oriented x 3, Strength and sensation are intact Psych: euthymic mood, full affect  Wt Readings from Last 3 Encounters:  12/01/17 244 lb 12.8 oz (111 kg)  10/29/17 231 lb 12.8 oz (105.1 kg)  10/07/17 241 lb (109.3 kg)      Studies/Labs Reviewed:   EKG:  EKG is*** ordered today.  The ekg ordered today demonstrates ***  Recent Labs: 06/12/2017: ALT 34 06/19/2017: Magnesium 1.8 08/08/2017: Hemoglobin 14.2; Platelets 139 09/25/2017: BUN 17; Creatinine, Ser 2.49; Potassium 4.2; Sodium 141   Lipid Panel    Component Value Date/Time   CHOL 108 12/24/2016 1313   TRIG 89 12/24/2016 1313   HDL 35 (L) 12/24/2016 1313   CHOLHDL 3.1 12/24/2016 1313   CHOLHDL 3.2 02/28/2016 0906   VLDL 19 02/28/2016 0906   LDLCALC 55 12/24/2016 1313   LDLDIRECT 58 11/06/2011 1434    Additional studies/ records that were reviewed today include:   ECHO: 11/21/2016 - Left ventricle: The cavity size was normal. There was severe   concentric hypertrophy. Systolic function was severely reduced.   The estimated  ejection fraction was in the range of 25% to 30%.   Diffuse hypokinesis. - Left atrium: The atrium was severely dilated. - Right ventricle: Systolic function was moderately reduced. - Right atrium: The atrium was moderately dilated. - Tricuspid valve: There was trivial regurgitation. - Pulmonary arteries: Systolic pressure was at the upper limits of   normal. - Inferior vena cava: The vessel was normal in size. - Pericardium, extracardiac: There was no pericardial effusion.  Impressions:  - When compared to the  prior study from 02/23/2014, LVEF has   decreased from 55-60% to 25-30% with diffuse hypokinesis, RVEF is   moderately decreased. This is possibly tachycardia mediated, the   study was acquired while patient in atrial fibrillation with RVR.   CATH: 03/19/2002 DIAGNOSTIC FINDINGS:  1. Left main:  Angiographically normal.  2. LAD:  The LAD is a large vessel giving rise to two diagonal branches.  It     is angiographically normal.  3. Circumflex:  The circumflex is a large vessel giving rise to two obtuse     marginal branches.  There is a 60-70% stenosis of the mid circumflex     extending into the proximal portion of the second obtuse marginal branch.  4. RCA:  The RCA is a large vessel.  There is a 20% stenosis of the mid     vessel.  5. LV:  EF equals 65% without regional wall motion abnormality.   PERCUTANEOUS CORONARY INTERVENTION TECHNIQUE:  The lesion of the circumflex  was directly stented with a 3.0 x 18 mm CYPHER stent  deployed at 14 atmospheres.  The mid and distal portions of the stent were  post-dilated using a 3.25 x 12 mm Quantum balloon at 14 atmospheres.  The  proximal portion of the stent was post-dilated using this balloon at 16  atmospheres.    IMPRESSION/PLAN:  Successful stenting of the circumflex lesion extending at  a second obtuse marginal branch using a drug-eluting stent.           ASSESSMENT:    1. Congestive heart failure, NYHA class 3,  chronic, systolic ([place])   2. NICM (nonischemic cardiomyopathy) ([place])   3. Permanent atrial fibrillation ([place])   4. HYPERTENSION, BENIGN SYSTEMIC      PLAN:  In order of problems listed above:  Chronic systolic and diastolic CHF LVEF 25 to [id number] echo 10/2016 creatinine 2.49 range no ACE or ARB because of renal function.  Nonischemic cardiomyopathy  Permanent atrial fibrillation status post ICD per reprogrammed by Dr. Caryl Comes last month on chronic anticoagulation  Status post ICD followed by Dr. Caryl Comes  CKD followed by Dr. Lorrene Reid who recommends we will need to accept some degree of worsening renal function in order to keep him out of heart failure.    Medication Adjustments/Labs and Tests Ordered: Current medicines are reviewed at length with the patient today.  Concerns regarding medicines are outlined above.  Medication changes, Labs and Tests ordered today are listed in the Patient Instructions below. There are no Patient Instructions on file for this visit.   Sumner Boast, PA-C  12/15/2017 3:03 PM    [facility] [place], [place], [place]  [address] Phone: [phone]; Fax: [phone]

## 2017-12-16 ENCOUNTER — Encounter: Payer: Self-pay | Admitting: Internal Medicine

## 2017-12-16 ENCOUNTER — Ambulatory Visit: Payer: Medicare Other | Admitting: Physician Assistant

## 2017-12-16 ENCOUNTER — Ambulatory Visit: Payer: Medicare Other | Admitting: Internal Medicine

## 2017-12-16 VITALS — BP 140/88 | HR 73 | Ht 73.0 in | Wt 247.0 lb

## 2017-12-16 DIAGNOSIS — I5022 Chronic systolic (congestive) heart failure: Secondary | ICD-10-CM

## 2017-12-16 DIAGNOSIS — I482 Chronic atrial fibrillation: Secondary | ICD-10-CM

## 2017-12-16 DIAGNOSIS — Z9889 Other specified postprocedural states: Secondary | ICD-10-CM

## 2017-12-16 DIAGNOSIS — I4821 Permanent atrial fibrillation: Secondary | ICD-10-CM

## 2017-12-16 DIAGNOSIS — I428 Other cardiomyopathies: Secondary | ICD-10-CM | POA: Diagnosis not present

## 2017-12-16 DIAGNOSIS — Z95810 Presence of automatic (implantable) cardiac defibrillator: Secondary | ICD-10-CM

## 2017-12-16 LAB — CUP PACEART INCLINIC DEVICE CHECK
Battery Remaining Longevity: 70 mo
Battery Voltage: 2.98 V
Brady Statistic AP VP Percent: 0 %
Brady Statistic AP VS Percent: 0 %
Brady Statistic AS VP Percent: 0 %
Brady Statistic AS VS Percent: 0 %
Brady Statistic RA Percent Paced: 0 %
Brady Statistic RV Percent Paced: 99.95 %
Date Time Interrogation Session: 20190723165904
HighPow Impedance: 64 Ohm
Implantable Lead Implant Date: 20190301
Implantable Lead Implant Date: 20190301
Implantable Lead Location: 753858
Implantable Lead Location: 753860
Implantable Pulse Generator Implant Date: 20190301
Lead Channel Impedance Value: 1064 Ohm
Lead Channel Impedance Value: 1083 Ohm
Lead Channel Impedance Value: 1121 Ohm
Lead Channel Impedance Value: 1235 Ohm
Lead Channel Impedance Value: 1254 Ohm
Lead Channel Impedance Value: 1292 Ohm
Lead Channel Impedance Value: 304 Ohm
Lead Channel Impedance Value: 317.612
Lead Channel Impedance Value: 330.057
Lead Channel Impedance Value: 330.057
Lead Channel Impedance Value: 346.164
Lead Channel Impedance Value: 399 Ohm
Lead Channel Impedance Value: 4047 Ohm
Lead Channel Impedance Value: 513 Ohm
Lead Channel Impedance Value: 608 Ohm
Lead Channel Impedance Value: 608 Ohm
Lead Channel Impedance Value: 665 Ohm
Lead Channel Impedance Value: 722 Ohm
Lead Channel Pacing Threshold Amplitude: 0.75 V
Lead Channel Pacing Threshold Amplitude: 2.25 V
Lead Channel Pacing Threshold Pulse Width: 0.4 ms
Lead Channel Pacing Threshold Pulse Width: 1.5 ms
Lead Channel Sensing Intrinsic Amplitude: 24.625 mV
Lead Channel Setting Pacing Amplitude: 2.5 V
Lead Channel Setting Pacing Amplitude: 2.5 V
Lead Channel Setting Pacing Pulse Width: 0.4 ms
Lead Channel Setting Pacing Pulse Width: 1.5 ms
Lead Channel Setting Sensing Sensitivity: 0.3 mV

## 2017-12-16 NOTE — Progress Notes (Signed)
Patient Care Team: Sela Hilding, MD as PCP - General (Family Medicine) Dorothy Spark, MD as PCP - Cardiology (Cardiology) Pleasant, Eppie Gibson, RN as [place] Management   HPI  Anthony [name] is a 67 y.o. male Seen for atrial fibrillation with a poorly controlled ventricular response now s/p AV ablation.3/19  Is a history of ischemic heart disease with stenting 2003.  He was noted to have low voltage and left ventricular hypertrophy and the issue was raised as to whether he might have transthyretin amyloid technetium; pyrophosphate imaging was notable for only grade 1 findings.  He has not undergone electrophoresis testing.  DATE TEST    7/15  Myoview    No ischemia  9/15  Echo   EF 55-60 %   6/18 Echo EF 25-30% Severe LAE     Date Cr K Mg Hgb  10/17 1.55 3.4  13.7  7/18 1.82  1.5  13.8  9/18 2.2 4.5  13.5  3/19 2.09 3.3    4/19 2.5 5.3            He is improved since AV ablation;  More recently he has had more edema and accompanying shortness of breath  Takes diuretics bid with only modest response  Past Medical History:  Diagnosis Date  . Arthritis   . CAD in native artery    a. reported MI 2000, 2001, s/p stenting of the circumflex lesion extending into the second obtuse marginal branch with a drug-eluting stent in 2003. b. low risk nuc 2015.  Marland Kitchen Chronic atrial fibrillation ([place])   . Chronic combined systolic and diastolic CHF (congestive heart failure) ([place])    a. Previously diastolic, then EF 97-98% in 10/2016.  . CKD (chronic kidney disease), stage III ([place])   . Depression   . Diabetes mellitus   . Edema of extremities   . Erectile dysfunction   . GERD (gastroesophageal reflux disease)   . Hyperlipidemia   . Hypertension   . Myocardial infarction ([place]) 2000&2001  . Obesity   . Onychomycosis   . OSA (obstructive sleep apnea)   . S/P ICD (internal cardiac defibrillator) procedure and BiV device,  07/25/17 Medtronic   07/26/2017  . Seizures ([place])   . Tubular adenoma of colon 10/2002    Past Surgical History:  Procedure Laterality Date  . ANGIOPLASTY  2001   stent x 1  . AV NODE ABLATION N/A 08/20/2017   Procedure: AV NODE ABLATION;  Surgeon: Deboraha Sprang, MD;  Location: [facility];  Service: Cardiovascular;  Laterality: N/A;  . BIV ICD INSERTION CRT-D N/A 07/25/2017   Procedure: BIV ICD INSERTION CRT-D;  Surgeon: Deboraha Sprang, MD;  Location: [facility];  Service: Cardiovascular;  Laterality: N/A;  . COLONOSCOPY  2000?   negative  . FOOT ARTHROTOMY Right   . TOE AMPUTATION Left 2012    Current Outpatient Medications  Medication Sig Dispense Refill  . acetaminophen (TYLENOL) 325 MG tablet Take 1-2 tablets (325-650 mg total) by mouth every 4 (four) hours as needed for mild pain.    Marland Kitchen apixaban (ELIQUIS) 5 MG TABS tablet Take 1 tablet (5 mg total) by mouth 2 (two) times daily. 60 tablet 11  . calcitRIOL (ROCALTROL) 0.25 MCG capsule TAKE 0.25 MG BY MOUTH daily  5  . furosemide (LASIX) 40 MG tablet Take 1 tablet (40 mg total) by mouth 2 (two) times daily. 180 tablet 3  . gabapentin (NEURONTIN)  300 MG capsule TAKE ONE CAPSULE BY MOUTH EVERY NIGHT AT BEDTIME 90 capsule 0  . HUMULIN 70/30 KWIKPEN (70-30) 100 UNIT/ML PEN INJECT 55 UNITS INTO THE SKIN DAILY. 15 pen 2  . hydrALAZINE (APRESOLINE) 25 MG tablet Take 1 tablet (25 mg total) by mouth 3 (three) times daily. 90 tablet 11  . Insulin Pen Needle (PEN NEEDLES) 32G X 6 MM MISC 1 Units by Does not apply route as needed. 100 each 11  . KLOR-CON M20 20 MEQ tablet TAKE 1 TABLET BY MOUTH TWICE A DAY 180 tablet 2  . labetalol (NORMODYNE) 200 MG tablet TAKE 2 TABLETS (400 MG TOTAL) BY MOUTH 2 (TWO) TIMES DAILY. 120 tablet 11  . Multiple Vitamin (MULTIVITAMIN) tablet Take 1 tablet by mouth daily.      . nitroGLYCERIN (NITROSTAT) 0.4 MG SL tablet Place 0.4 mg under the tongue every 5 (five) minutes as needed for chest pain. If no relief by 3rd tab,  call 911     . Omega 3 1000 MG CAPS Take 2,000 mg by mouth daily.     Marland Kitchen omeprazole (PRILOSEC) 40 MG capsule TAKE ONE CAPSULE BY MOUTH DAILY 30 capsule 0  . oxymorphone (OPANA) 5 MG tablet Take 5 mg by mouth every 6 (six) hours as needed for pain (max 3 tablets daily). Per Dr Brien Few    . PARoxetine (PAXIL) 40 MG tablet Take 40 mg by mouth daily.    . polyethylene glycol (MIRALAX / GLYCOLAX) packet Take 17 g by mouth daily. 14 each 0  . rosuvastatin (CRESTOR) 5 MG tablet TAKE 1 TABLET BY MOUTH DAILY. 30 tablet 2  . Semaglutide (OZEMPIC) 0.25 or 0.5 MG/DOSE SOPN Inject 0.25 mg into the skin once a week. 1.5 mL 2   No current facility-administered medications for this visit.     Allergies  Allergen Reactions  . Enalapril Maleate Cough      Review of Systems negative except from HPI and PMH  Physical Exam BP 140/88   Pulse 73   Ht 6\' 1"  (1.854 m)   Wt 247 lb (112 kg)   SpO2 99%   BMI 32.59 kg/m  Well developed and nourished in no acute distress HENT normal Neck supple with JVP-7 Clear Regular rate and rhythm, no murmurs or gallops Abd-soft with active BS No Clubbing cyanosis 1-2+ edema Skin-warm and dry A & Oriented  Grossly normal sensory and motor function   ECG  Atrial fib Vpacing @ 73 -/15/47 QRS neg 1; rS V1  Assessment and  Plan  Atrial fibrillation-permanent rapid ventricular response  AV ablation   Low-voltage limb leads and severe concentric LVH concerning for amyloid   Congestive heart failure-chronic-systolic-class IIb    Hypertension  Renal insufficiency grade 4  Presyncope  Nonischemic cardiomyopathy-new  Implantable defibrillator Medtronic     With volume overload, will increase diuretic 40 bid >>80 daily  Continue other meds  Will reevaluate LV function following AV ablation   No syncope

## 2017-12-16 NOTE — Patient Instructions (Signed)
Medication Instructions:  Your physician has recommended you make the following change in your medication:   1. Take 80mg  of lasix, 2 tablets together, in the morning for the next 3 days. On Saturday July 27, begin taking your usual dose and frequency of lasix of 40mg , one tablet, two times per day.  Labwork: None ordered.  Testing/Procedures: Your physician has requested that you have an echocardiogram. Echocardiography is a painless test that uses sound waves to create images of your heart. It provides your doctor with information about the size and shape of your heart and how well your heart's chambers and valves are working. This procedure takes approximately one hour. There are no restrictions for this procedure.   Follow-Up: Your physician wants you to follow-up in: 6 months with Anthony [name]. You will receive a reminder letter in the mail two months in advance. If you don't receive a letter, please call our office to schedule the follow-up appointment.  Remote monitoring is used to monitor your ICD from home. This monitoring reduces the   number of office visits required to check your device to one time per year. It allows [place] to keep an eye on the functioning of your device to ensure it is working properly. You are scheduled for a device check from home on 03/17/2018. You may send your transmission at any time that day. If you have a wireless device, the transmission will be sent automatically. After your physician reviews your transmission, you will receive a postcard with your next transmission date.    Any Other Special Instructions Will Be Listed Below (If Applicable).     If you need a refill on your cardiac medications before your next appointment, please call your pharmacy.

## 2017-12-17 ENCOUNTER — Other Ambulatory Visit: Payer: Self-pay

## 2017-12-17 NOTE — Patient Outreach (Signed)
[facility] [facility]) Care Management  12/17/2017  Anthony [name] 1950/09/06 [id number]  67year old malereferred to [place] Management.[place] services requested for medication assistance for Humulin 70/30, Semaglutide and Eliquis.PMHx includes, but not limited to, hypertension, atrial fibrillation, GERD, Type 2 diabetes mellitus, Stage 3 CKD, diabetic retinopathy, hypercholesterolemia and seizures.  Incoming call received from Anthony [name]. HIPAA identifiers verified.   Medication Management: Anthony [name] states that he has gotten a print out from both of his pharmacies and he feels that he may be eligible to apply for patient assistance.  He states that his out of pocket expenditure is $973.35.  He would need to spend $1000 to be eligible for Ozempic and Novolin 70/30 made by [employer].  He may also be eligible for Eliquis for BMS.    Plan: Route note to PCP, Dr. Lindell Noe and ask if patient can switch to Novolin 70/30.  When I hear back about Novolin 74/73, will begin application process.    Joetta Manners, PharmD Clinical Pharmacist [place] [phone]

## 2017-12-18 ENCOUNTER — Other Ambulatory Visit: Payer: Self-pay

## 2017-12-18 NOTE — Patient Outreach (Signed)
[place] [facility]) Care Management  12/18/2017  Anthony [name] January 14, 1951 [id number]  In-basket message received from Dr. Lindell Noe.   She approved switch to Novolin 70/30,  wiith directions to inject 55 units  SQ daily.   Will begin application process for patient assistance for Novolin 70/30 and Ozempic from [employer].  Patient currently using Humulin 46/96 until application approved.  Plan: Route note to CPhT, Etter Sjogren to begin patient assistance application for Ozempic and Novolin 70/30 through [employer].  Joetta Manners, PharmD Clinical Pharmacist [place] [phone]

## 2017-12-19 ENCOUNTER — Other Ambulatory Visit: Payer: Self-pay | Admitting: Family Medicine

## 2017-12-19 ENCOUNTER — Other Ambulatory Visit: Payer: Self-pay | Admitting: Pharmacy Technician

## 2017-12-19 ENCOUNTER — Ambulatory Visit: Payer: Self-pay | Admitting: *Deleted

## 2017-12-19 NOTE — Patient Outreach (Signed)
[place] [facility]) Care Management  12/19/2017  Anthony [name] [birth year] [id number]   Received Novo Nordisk patient assistance referral from [place] for Novolin 70/30 and Ozempic. Prepared patient portion to be mailed and faxed provider portion to Dr. Lindell Noe.  Will follow up with patient in 7-10 business days to confirm application has been received.  [name] [place], [place] Management [phone]

## 2017-12-29 ENCOUNTER — Other Ambulatory Visit: Payer: Self-pay | Admitting: Cardiology

## 2018-01-01 ENCOUNTER — Other Ambulatory Visit: Payer: Self-pay | Admitting: Family Medicine

## 2018-01-06 ENCOUNTER — Other Ambulatory Visit: Payer: Self-pay

## 2018-01-06 ENCOUNTER — Other Ambulatory Visit: Payer: Self-pay | Admitting: Family Medicine

## 2018-01-06 ENCOUNTER — Other Ambulatory Visit: Payer: Self-pay | Admitting: Pharmacy Technician

## 2018-01-06 NOTE — Patient Outreach (Addendum)
[place] [facility]) Care Management  01/06/2018  RHYKER SILVERSMITH 08-Feb-1951 [id number]  Unsuccessful outreach attempt to [name].  Left HIPAA compliant voice message requesting a return call.   Plan: Outreach attempt in 3 business days to see if he has received his patient assistance applications.    Joetta Manners, PharmD Clinical Pharmacist [place] [phone]  Addendum: Incoming call received from [name].  HIPAA identifiers verified.   Mr. Gladu states that he has not received the patient assistance application.  He verbalized that he will look through his mail again, but asked that we resend the application.  He reports that he has his OOP printout needed for the application.   Requested patient call me back in two weeks if he has not received the new application.   Plan: Route note to CPhT, Etter Sjogren and ask her to resend the patient assistance application.  Joetta Manners, PharmD Clinical Pharmacist [place] [phone]

## 2018-01-06 NOTE — Patient Outreach (Signed)
[facility]) Care Management  01/06/2018  Anthony [name] 08/22/1950 [id number]   Received message from [place] that patient states he has not received patient assistance application that was mailed to him. Prepared another application to be mailed.  [name] [place], [place] Management [phone]

## 2018-01-09 ENCOUNTER — Ambulatory Visit: Payer: Medicare Other

## 2018-01-11 ENCOUNTER — Other Ambulatory Visit: Payer: Self-pay | Admitting: Cardiology

## 2018-01-12 ENCOUNTER — Other Ambulatory Visit (HOSPITAL_COMMUNITY): Payer: Medicare Other

## 2018-01-14 ENCOUNTER — Other Ambulatory Visit: Payer: Self-pay | Admitting: Pharmacy Technician

## 2018-01-14 NOTE — Patient Outreach (Signed)
[place] [facility]) Care Management  01/14/2018  Anthony [name] [birth year] [id number]   Incoming call from patient to inform me that he received the [employer] patient assistance application that was mailed to him. Patient also wanted to confirm what documents he needed to send back in. Reviewed with patient and he stated he would send mail items back in.  Will follow up with patient if application has not been received in 10-14 business days.  [name] [place], [place] Management [phone]

## 2018-01-15 ENCOUNTER — Other Ambulatory Visit: Payer: Self-pay

## 2018-01-15 ENCOUNTER — Ambulatory Visit (HOSPITAL_COMMUNITY): Payer: Medicare Other | Attending: Cardiovascular Disease

## 2018-01-15 DIAGNOSIS — I071 Rheumatic tricuspid insufficiency: Secondary | ICD-10-CM | POA: Diagnosis not present

## 2018-01-15 DIAGNOSIS — I110 Hypertensive heart disease with heart failure: Secondary | ICD-10-CM | POA: Diagnosis not present

## 2018-01-15 DIAGNOSIS — E119 Type 2 diabetes mellitus without complications: Secondary | ICD-10-CM | POA: Insufficient documentation

## 2018-01-15 DIAGNOSIS — I4821 Permanent atrial fibrillation: Secondary | ICD-10-CM

## 2018-01-15 DIAGNOSIS — Z9889 Other specified postprocedural states: Secondary | ICD-10-CM

## 2018-01-15 DIAGNOSIS — I5022 Chronic systolic (congestive) heart failure: Secondary | ICD-10-CM

## 2018-01-15 DIAGNOSIS — I428 Other cardiomyopathies: Secondary | ICD-10-CM

## 2018-01-15 DIAGNOSIS — I482 Chronic atrial fibrillation: Secondary | ICD-10-CM | POA: Insufficient documentation

## 2018-01-15 DIAGNOSIS — E785 Hyperlipidemia, unspecified: Secondary | ICD-10-CM | POA: Diagnosis not present

## 2018-01-15 DIAGNOSIS — E669 Obesity, unspecified: Secondary | ICD-10-CM | POA: Diagnosis not present

## 2018-01-15 DIAGNOSIS — Z95810 Presence of automatic (implantable) cardiac defibrillator: Secondary | ICD-10-CM | POA: Diagnosis not present

## 2018-01-15 DIAGNOSIS — I2510 Atherosclerotic heart disease of native coronary artery without angina pectoris: Secondary | ICD-10-CM | POA: Diagnosis not present

## 2018-01-15 NOTE — Patient Outreach (Signed)
[facility]) Care Management  01/15/2018  Anthony [name] 1951/01/23 [id number]  Incoming message received from [name] on 01/14/18.   Return outreach attempt was unsuccessful.  Left HIPAA compliant voice message requesting a return call.  Plan: Outreach attempt tomorrow.  Joetta Manners, PharmD Clinical Pharmacist [place] [phone]

## 2018-01-16 ENCOUNTER — Other Ambulatory Visit: Payer: Self-pay

## 2018-01-16 ENCOUNTER — Ambulatory Visit: Payer: Self-pay

## 2018-01-16 ENCOUNTER — Ambulatory Visit: Payer: Medicare Other | Admitting: Family Medicine

## 2018-01-16 NOTE — Patient Outreach (Signed)
[facility]) Care Management  01/16/2018  TAQUAN BRALLEY 04-30-1951 [id number]  Incoming call received from [name].  HIPAA identifiers verified.   Mr. Hubers reports that he mailed the completed patient assistance application to [place] yesterday.  Plan: Continue to follow application process with [facility] CPhT, Etter Sjogren.  Joetta Manners, PharmD Clinical Pharmacist [place] [phone]

## 2018-01-17 ENCOUNTER — Other Ambulatory Visit: Payer: Self-pay | Admitting: Cardiology

## 2018-01-20 ENCOUNTER — Other Ambulatory Visit: Payer: Self-pay | Admitting: *Deleted

## 2018-01-20 NOTE — Patient Outreach (Signed)
[place]) Care Management  01/20/2018  ASTIN SAYRE [birth year] [id number]   RN Health Coach attempted # 60follow up outreach call to patient.  Per patient this was not a good time to talk with me. Patient requested that [place] call back at 4 pm.  [place] Management [phone]

## 2018-01-20 NOTE — Telephone Encounter (Signed)
Please advise as requested medication is not on patients current med list. Thanks, MI

## 2018-01-20 NOTE — Patient Outreach (Signed)
[place] [facility]) Care Management  01/20/2018   DEMETRIOUS RAINFORD 1950-12-17 [id number]  RN Health Coach telephone call to patient.  Hipaa compliance verified. Per patient he is doing very well. Patient A1C is 7.2. Patient is trying to eat healthier. Per patient he has received the information that the RN Health Coach sent him. Patient has not had any symptoms of hyper or hypoglycemia since last outreach. Patient has agreed to further outreach calls.  Current Medications:  Current Outpatient Medications  Medication Sig Dispense Refill  . acetaminophen (TYLENOL) 325 MG tablet Take 1-2 tablets (325-650 mg total) by mouth every 4 (four) hours as needed for mild pain.    Marland Kitchen apixaban (ELIQUIS) 5 MG TABS tablet Take 1 tablet (5 mg total) by mouth 2 (two) times daily. 60 tablet 11  . calcitRIOL (ROCALTROL) 0.25 MCG capsule TAKE 0.25 MG BY MOUTH daily  5  . furosemide (LASIX) 40 MG tablet Take 1 tablet (40 mg total) by mouth 2 (two) times daily. 180 tablet 3  . gabapentin (NEURONTIN) 300 MG capsule TAKE ONE CAPSULE BY MOUTH EVERY NIGHT AT BEDTIME 90 capsule 0  . HUMULIN 70/30 KWIKPEN (70-30) 100 UNIT/ML PEN INJECT 55 UNITS INTO THE SKIN DAILY. 15 pen 2  . hydrALAZINE (APRESOLINE) 25 MG tablet Take 1 tablet (25 mg total) by mouth 3 (three) times daily. 90 tablet 11  . Insulin Pen Needle (PEN NEEDLES) 32G X 6 MM MISC 1 Units by Does not apply route as needed. 100 each 11  . KLOR-CON M20 20 MEQ tablet TAKE 1 TABLET BY MOUTH TWICE A DAY 180 tablet 1  . labetalol (NORMODYNE) 200 MG tablet TAKE 2 TABLETS (400 MG TOTAL) BY MOUTH 2 (TWO) TIMES DAILY. 120 tablet 11  . Multiple Vitamin (MULTIVITAMIN) tablet Take 1 tablet by mouth daily.      . nitroGLYCERIN (NITROSTAT) 0.4 MG SL tablet Place 0.4 mg under the tongue every 5 (five) minutes as needed for chest pain. If no relief by 3rd tab, call 911     . Omega 3 1000 MG CAPS Take 2,000 mg by mouth daily.     Marland Kitchen omeprazole (PRILOSEC) 40 MG capsule TAKE  ONE CAPSULE BY MOUTH DAILY 30 capsule 0  . oxymorphone (OPANA) 5 MG tablet Take 5 mg by mouth every 6 (six) hours as needed for pain (max 3 tablets daily). Per Dr Brien Few    . PARoxetine (PAXIL) 40 MG tablet TAKE ONE TABLET BY MOUTH EVERY MORNING 90 tablet 2  . polyethylene glycol (MIRALAX / GLYCOLAX) packet Take 17 g by mouth daily. 14 each 0  . rosuvastatin (CRESTOR) 5 MG tablet TAKE 1 TABLET BY MOUTH EVERY DAY 90 tablet 1  . Semaglutide (OZEMPIC) 0.25 or 0.5 MG/DOSE SOPN Inject 0.25 mg into the skin once a week. 1.5 mL 2   No current facility-administered medications for this visit.     Functional Status:  In your present state of health, do you have any difficulty performing the following activities: 11/19/2017 08/11/2017  Hearing? N N  Vision? N N  Difficulty concentrating or making decisions? N N  Walking or climbing stairs? N N  Dressing or bathing? N N  Doing errands, shopping? N Y  Conservation officer, nature and eating ? N N  Using the Toilet? N N  In the past six months, have you accidently leaked urine? N N  Do you have problems with loss of bowel control? N N  Managing your Medications? N N  Managing your  Finances? N N  Housekeeping or managing your Housekeeping? N N  Some recent data might be hidden    Fall/Depression Screening: Fall Risk  12/01/2017 11/19/2017 09/02/2017  Falls in the past year? No Yes No  Number falls in past yr: - 1 -  Injury with Fall? - Yes -  Risk for fall due to : - - -  Risk for fall due to: Comment - - -  Follow up - Falls evaluation completed -  Comment - - -   PHQ 2/9 Scores 12/01/2017 11/19/2017 09/02/2017 07/28/2017 07/24/2017 02/12/2017 01/17/2017  PHQ - 2 Score 0 0 0 0 0 0 0  PHQ- 9 Score - - - - - - -  Exception Documentation - - - - - - -  Not completed - - - - - - -   TH[facility] CM Care Plan Problem One     Most Recent Value  Care Plan Problem One  Knowlwdge Deficit in self management of Diabetes  Role Documenting the Problem One  [place] for  Problem One  Active  THN Long Term Goal   Patient will check his blood sugars and record as per physician order   [facility] CM Short Term Goal #1   Patient will be able to verbalize other areas to give his insulin within the next 30 days  Interventions for Short Term Goal #1  Patient received information on changing sites and will have to explore more with site rotation. RN will follow up with further discussion  THN CM Short Term Goal #2   patient will be able to verbalize healthy menu choices from fast food restaurants within the next 30 days  Interventions for Short Term Goal #2  Patient has received information on making healthuier food choices dining out. Patient is trying to use the list. RN will follow up with further discussion  THN CM Short Term Goal #3  Patient will verbalize weighing and documenting within the next 30 days  Interventions for Short Tern Goal #3  Patient is doing daily weights. Will follow up with further compliance.       Assessment:  A1C 7.2 Fasting blood sugar 129    Plan:  RN discussed A1C and encouraged patient on progress RN discussed healthy food choices RN sent educational packet on CHF RN discussed cooking with less salt RN discussed low sodium diet RN sent educational material on low sodium and Dash diet RN will follow up outreach within the month of September  [place] Luis [place] Management [phone]

## 2018-01-27 ENCOUNTER — Other Ambulatory Visit: Payer: Self-pay

## 2018-01-27 NOTE — Patient Outreach (Signed)
[place] [facility]) Care Management  01/27/2018  Anthony [name] Dec 11, 1950 [id number]  Incoming call received from [name].  HIPAA identifiers verified.   [name] states that the patient assistance application that he sent to [place] was received  "return to sender" today.  He reports that he is close to the [facility] office and will drop the application off.  I have made arrangements for [facility] CPhT, Dewaine Oats to meet him in the parking lot to pick up the envelope.  Plan: Continue to follow patient assistance application process with CPhT, Etter Sjogren.  Joetta Manners, PharmD Clinical Pharmacist [place] [phone]

## 2018-01-29 ENCOUNTER — Other Ambulatory Visit: Payer: Self-pay | Admitting: Pharmacy Technician

## 2018-01-29 NOTE — Patient Outreach (Signed)
[place] [facility]) Care Management  01/29/2018  EMIGDIO WILDEMAN 04/05/1951 [id number]   Received patient portion of Novo Nordisk patient assistance application. Faxed completed application and required documents to [employer].  Will contact company in 2-3 business days to check status of application.  [name] [place], [place] Management [phone]

## 2018-02-03 ENCOUNTER — Other Ambulatory Visit: Payer: Self-pay | Admitting: Family Medicine

## 2018-02-03 ENCOUNTER — Other Ambulatory Visit: Payer: Self-pay | Admitting: Pharmacy Technician

## 2018-02-03 NOTE — Patient Outreach (Signed)
[place] [facility]) Care Management  02/03/2018  Anthony [name] 09-03-1950 [id number]   Follow up call to [place] to check status of patient applications for Ozempic and Novolin 70/30. Janace Hoard confirmed patient has been approved as of 9/9 until 04/25/18 and that medication should arrive at providers office in 7-10 business days.  Will follow up with patient in 10-14 business days to confirm medication has been received.  [name] [place], [place] Management [phone]

## 2018-02-06 ENCOUNTER — Ambulatory Visit: Payer: Medicare Other | Admitting: Podiatry

## 2018-02-12 ENCOUNTER — Other Ambulatory Visit: Payer: Self-pay | Admitting: Pharmacy Technician

## 2018-02-12 ENCOUNTER — Telehealth: Payer: Self-pay

## 2018-02-12 ENCOUNTER — Telehealth: Payer: Self-pay | Admitting: *Deleted

## 2018-02-12 NOTE — Patient Outreach (Signed)
[place] [facility]) Care Management  02/12/2018  Anthony [name] [birth year] [id number]   Unsuccessful outreach attempt, HIPAA compliant voicemail left. Called patient in regards to "encounter" in Epic from providers office stating that patient picked up medicine from office today. Need to confirm with patient that her received Ozempic and Novolin 70/30.  Will make 2nd call attempt in 2-3 business days if call has not been returned.  [name] [place], [place] Management [phone]

## 2018-02-12 NOTE — Telephone Encounter (Signed)
Pt called to check status of meds from [employer].  Advised we have not received yet.  We will call when we receive.  Will forward to Dr. Valentina Lucks and Dr. Lindell Noe as Juluis Rainier. Ethelle Ola, Salome Spotted, CMA

## 2018-02-12 NOTE — Telephone Encounter (Signed)
LVM for pt to call the office. If he calls, please let him know we have some medicine in the refrigerator for him to pick up. Ottis Stain, CMA

## 2018-02-12 NOTE — Patient Outreach (Signed)
[place]) Care Management  02/12/2018  Anthony [name] 06-20-1950 [id number]   Return call from patient, HIPAA identifiers verified. Patient confirmed that he picked up his Ozempic and Novolin 70/30 from providers office today. He states he received 5 boxes of box medications which should last him until the end of the year.  Will route note to [place] for case closure.  [name] [place], [place] Management [phone]

## 2018-02-12 NOTE — Telephone Encounter (Signed)
Pt picked up medication. Ottis Stain, CMA

## 2018-02-12 NOTE — Telephone Encounter (Signed)
Patient informed. Danley Danker, RN [facility] RN)

## 2018-02-13 ENCOUNTER — Other Ambulatory Visit: Payer: Self-pay

## 2018-02-13 NOTE — Patient Outreach (Signed)
[place] [facility]) Care Management  02/13/2018  Anthony [name] 03-23-1951 [id number]   Note received from CPhT, Anthony [name] that Anthony [name] had received his patient assistance medications.   Patient confirmed that he picked up his Ozempic and Novolin 70/30 from providers office today. He states he received 5 boxes of box medications which should last him until the end of the year  Plan: Close [place] case.   Route case closure letter to PCP, Anthony [name].  Anthony [name], PharmD Clinical Pharmacist [place] [phone]

## 2018-02-13 NOTE — Telephone Encounter (Signed)
I haven't heard anything since doing the form. Will route to his [facility] pharmacist too.

## 2018-02-18 ENCOUNTER — Other Ambulatory Visit: Payer: Self-pay | Admitting: *Deleted

## 2018-02-18 NOTE — Patient Outreach (Signed)
[place] [facility]) Care Management  02/18/2018  LEVAUGHN PUCCINELLI May 16, 1951 [id number]  RN Health Coach attempted #20follow up outreach call to patient.  Patient was unavailable. HIPPA compliance voicemail message left with return callback number.  Plan: RN will call patient again within 10 business days.  [place] Care Management [phone]

## 2018-02-19 NOTE — Patient Outreach (Signed)
[facility]) Care Management  02/19/2018   Anthony [name] [birth year] [id number]  RN Health Coach telephone call to patient.  Hipaa compliance verified. Per patient his fasting blood sugar was 118. Patient stated he is trying to eat healthier. Patient is doing his daily weight also.  Per patient he stated he received the educational material on CHF. Patient is trying to decrease the sodium in diet and decrease the carbohydrates. Per patient he does not have an advance directive or living will. Patient has agreed to [place] to send him one. Patient stated that he had picked up the medication  Ozempic and Novolin 70/30 from the provider office. Per patient he is so thankful for the assistance.  Patient has agreed to follow up outreach calls   Current Medications:  Current Outpatient Medications  Medication Sig Dispense Refill  . acetaminophen (TYLENOL) 325 MG tablet Take 1-2 tablets (325-650 mg total) by mouth every 4 (four) hours as needed for mild pain.    Marland Kitchen apixaban (ELIQUIS) 5 MG TABS tablet Take 1 tablet (5 mg total) by mouth 2 (two) times daily. 60 tablet 11  . calcitRIOL (ROCALTROL) 0.25 MCG capsule TAKE 0.25 MG BY MOUTH daily  5  . furosemide (LASIX) 40 MG tablet Take 1 tablet (40 mg total) by mouth 2 (two) times daily. 180 tablet 3  . gabapentin (NEURONTIN) 300 MG capsule TAKE ONE CAPSULE BY MOUTH EVERY NIGHT AT BEDTIME 90 capsule 0  . HUMULIN 70/30 KWIKPEN (70-30) 100 UNIT/ML PEN INJECT 55 UNITS INTO THE SKIN DAILY. 15 pen 2  . hydrALAZINE (APRESOLINE) 25 MG tablet Take 1 tablet (25 mg total) by mouth 3 (three) times daily. 90 tablet 11  . Insulin Pen Needle (PEN NEEDLES) 32G X 6 MM MISC 1 Units by Does not apply route as needed. 100 each 11  . KLOR-CON M20 20 MEQ tablet TAKE 1 TABLET BY MOUTH TWICE A DAY 180 tablet 1  . labetalol (NORMODYNE) 200 MG tablet TAKE 2 TABLETS (400 MG TOTAL) BY MOUTH 2 (TWO) TIMES DAILY. 120 tablet 11  . Multiple Vitamin  (MULTIVITAMIN) tablet Take 1 tablet by mouth daily.      . nitroGLYCERIN (NITROSTAT) 0.4 MG SL tablet Place 0.4 mg under the tongue every 5 (five) minutes as needed for chest pain. If no relief by 3rd tab, call 911     . Omega 3 1000 MG CAPS Take 2,000 mg by mouth daily.     Marland Kitchen omeprazole (PRILOSEC) 40 MG capsule TAKE ONE CAPSULE BY MOUTH DAILY 30 capsule 0  . oxymorphone (OPANA) 5 MG tablet Take 5 mg by mouth every 6 (six) hours as needed for pain (max 3 tablets daily). Per Dr Brien Few    . PARoxetine (PAXIL) 40 MG tablet TAKE ONE TABLET BY MOUTH EVERY MORNING 90 tablet 2  . polyethylene glycol (MIRALAX / GLYCOLAX) packet Take 17 g by mouth daily. 14 each 0  . rosuvastatin (CRESTOR) 5 MG tablet TAKE 1 TABLET BY MOUTH EVERY DAY 90 tablet 1  . Semaglutide (OZEMPIC) 0.25 or 0.5 MG/DOSE SOPN Inject 0.25 mg into the skin once a week. 1.5 mL 2   No current facility-administered medications for this visit.     Functional Status:  In your present state of health, do you have any difficulty performing the following activities: 11/19/2017 08/11/2017  Hearing? N N  Vision? N N  Difficulty concentrating or making decisions? N N  Walking or climbing stairs? N N  Dressing or  bathing? N N  Doing errands, shopping? N Y  Conservation officer, nature and eating ? N N  Using the Toilet? N N  In the past six months, have you accidently leaked urine? N N  Do you have problems with loss of bowel control? N N  Managing your Medications? N N  Managing your Finances? N N  Housekeeping or managing your Housekeeping? N N  Some recent data might be hidden    Fall/Depression Screening: Fall Risk  02/18/2018 12/01/2017 11/19/2017  Falls in the past year? Yes No Yes  Number falls in past yr: 1 - 1  Injury with Fall? Yes - Yes  Risk for fall due to : - - -  Risk for fall due to: Comment - - -  Follow up Falls evaluation completed - Falls evaluation completed  Comment - - -   PHQ 2/9 Scores 02/18/2018 12/01/2017 11/19/2017 09/02/2017  07/28/2017 07/24/2017 02/12/2017  PHQ - 2 Score 0 0 0 0 0 0 0  PHQ- 9 Score - - - - - - -  Exception Documentation - - - - - - -  Not completed - - - - - - -   THN CM Care Plan Problem One     Most Recent Value  Care Plan Problem One  Knowledge Deficit in self management of Diabetes  Role Documenting the Problem One  [place] for Problem One  Active  THN Long Term Goal   Patient will check his blood sugars and record as per physician order   [place] Term Goal Start Date  02/18/18  Interventions for Problem One Long Term Goal  RN reiterates checking blood sugars and documenting. RN follows up for compliance  THN CM Short Term Goal #1   Patient will be able to verbalize other areas to give his insulin within the next 30 days  THN CM Short Term Goal #1 Start Date  02/19/18  Interventions for Short Term Goal #1  Patient has gooten inforamtion. Patient has to get use to rotating sies. RN will follow up with further  discussion  THN CM Short Term Goal #2   patient will be able to verbalize healthy menu choices from fast food restaurants within the next 30 days  THN CM Short Term Goal #2 Start Date  02/19/18  Interventions for Short Term Goal #2  Patient is trying to eat healthier and made behavior modification. RN will follow up for further discussion  THN CM Short Term Goal #3  Patient will verbalize weighing and documenting within the next 30 days  THN CM Short Term Goal #3 Met Date  02/19/18  [facility] CM Short Term Goal #4  Patient will verbalize receiving the Advance directive packet within the next 30 days  THN CM Short Term Goal #4 Start Date  02/19/18  Interventions for Short Term Goal #4  RN discussed with patient about an advance directive and living well. Patient agreed for the RN to send advance directive packet. RN will follow up for receiving next outreach      Assessment:  Patient is checking blood sugar and daily weights Patient does not have an advance directive or  health care power of attorney Patient is trying to make healthier choices  Plan: RN discussed health care power of attorney RN sent an advance directive packet to patient RN reiterated checking blood sugars and weight RN will follow up outreach within the month of  November   Johny Shock BSN  [facility] [phone]

## 2018-03-03 ENCOUNTER — Telehealth: Payer: Self-pay | Admitting: Internal Medicine

## 2018-03-03 ENCOUNTER — Ambulatory Visit: Payer: Self-pay | Admitting: *Deleted

## 2018-03-03 NOTE — Telephone Encounter (Signed)
New Message:     Pt says he need a note stating that it is alright for him to go back work.

## 2018-03-04 ENCOUNTER — Other Ambulatory Visit: Payer: Self-pay | Admitting: Family Medicine

## 2018-03-05 ENCOUNTER — Other Ambulatory Visit: Payer: Self-pay

## 2018-03-05 ENCOUNTER — Ambulatory Visit: Payer: Medicare Other | Admitting: Family Medicine

## 2018-03-05 ENCOUNTER — Encounter: Payer: Self-pay | Admitting: Family Medicine

## 2018-03-05 VITALS — BP 118/62 | HR 84 | Temp 98.4°F | Ht 73.0 in | Wt 246.6 lb

## 2018-03-05 DIAGNOSIS — Z794 Long term (current) use of insulin: Secondary | ICD-10-CM | POA: Diagnosis not present

## 2018-03-05 DIAGNOSIS — Z23 Encounter for immunization: Secondary | ICD-10-CM

## 2018-03-05 DIAGNOSIS — Z1211 Encounter for screening for malignant neoplasm of colon: Secondary | ICD-10-CM | POA: Diagnosis not present

## 2018-03-05 DIAGNOSIS — E119 Type 2 diabetes mellitus without complications: Secondary | ICD-10-CM

## 2018-03-05 DIAGNOSIS — G4733 Obstructive sleep apnea (adult) (pediatric): Secondary | ICD-10-CM | POA: Diagnosis not present

## 2018-03-05 DIAGNOSIS — IMO0001 Reserved for inherently not codable concepts without codable children: Secondary | ICD-10-CM

## 2018-03-05 DIAGNOSIS — Z9989 Dependence on other enabling machines and devices: Secondary | ICD-10-CM

## 2018-03-05 LAB — POCT GLYCOSYLATED HEMOGLOBIN (HGB A1C): HbA1c, POC (controlled diabetic range): 6.8 % (ref 0.0–7.0)

## 2018-03-05 NOTE — Telephone Encounter (Signed)
Pt would like a letter to clear him for work. 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 Pt states he does not want to do any heavy lifting greater than 40lbs. I advised pt I would bring his request to Dr Caryl Comes. If he agrees, I will place his letter at the front desk as requested.

## 2018-03-05 NOTE — Telephone Encounter (Signed)
Dr Caryl Comes agrees with these return to work recommendations. Letter is printed and brought to the front for pt to pick up.

## 2018-03-05 NOTE — Patient Instructions (Addendum)
It was a pleasure to see you today! Thank you for choosing [facility] for your primary care. DELMAS FAUCETT was seen for DM.   Our plans for today were:  Reduce your insulin to 50  Units.   We talked about that you should avoid medicines with ibuprofen in them, like advil, aleeve, motrin. Tylenol/acetaminophen is a safe medicine if you have aches and pains.   Do the home colon cancer screening test again please.   Best,  Dr. Lindell Noe

## 2018-03-05 NOTE — Assessment & Plan Note (Addendum)
Reduce humalin to 50U to avoid overcontrol. Recheck 3 months. UTD on eyes, going to [facility] for CKD likely 2/2 DM. Foot exam performed today, got pneumonia vaccine today. Already had flu shot.

## 2018-03-05 NOTE — Progress Notes (Signed)
   CC: DM, health maintenance  HPI  DM - feels well. Taking ozempic, humalin 55U daily. He denies lows, but does feel a little funny right before it's time to eat.   CKD - following with [facility]. They did discuss HD as a future possibility. Last labs at [facility] on 01/22/18. Cr 3.00 GFR 21.   HM: Got flu shot at [facility] on [address]. Due for a home FOBT test.   Needs rx for new mask for CPAP. Wears it 5-6 hours 6/7 nights per week.   Still smoking 2-3 cigarettes per day. Proud of his progress from a lot more previously.   ROS: Denies CP, SOB, abdominal pain, dysuria, changes in BMs.   CC, SH/smoking status, and VS noted  Objective: BP 118/62   Pulse 84   Temp 98.4 F (36.9 C) (Oral)   Ht 6\' 1"  (1.854 m)   Wt 246 lb 9.6 oz (111.9 kg)   SpO2 98%   BMI 32.53 kg/m  Gen: NAD, alert, cooperative, and pleasant. HEENT: NCAT, EOMI, PERRL CV: RRR, no murmur Resp: CTAB, no wheezes, non-labored Ext: No edema, warm Neuro: Alert and oriented, Speech clear, No gross deficits  Assessment and plan:  IDDM (insulin dependent diabetes mellitus) (HCC) Reduce humalin to 50U to avoid overcontrol. Recheck 3 months. UTD on eyes, going to [facility] for CKD likely 2/2 DM. Foot exam performed today, got pneumonia vaccine today. Already had flu shot.   [facility] on CPAP Compliant, should continue use. I will message [facility] for how to order new supplies.    Orders Placed This Encounter  Procedures  . Fecal occult blood, imunochemical([facility])  . Pneumococcal polysaccharide vaccine 23-valent greater than or equal to 2yo subcutaneous/IM  . HgB A1c    No orders of the defined types were placed in this encounter.  Health Maintenance reviewed - home FOBT instructions given.  Ralene Ok, MD, PGY3 03/06/2018 11:58 AM

## 2018-03-06 NOTE — Assessment & Plan Note (Signed)
Compliant, should continue use. I will message [facility] for how to order new supplies.

## 2018-03-09 ENCOUNTER — Telehealth: Payer: Self-pay | Admitting: Family Medicine

## 2018-03-09 DIAGNOSIS — Z9989 Dependence on other enabling machines and devices: Principal | ICD-10-CM

## 2018-03-09 DIAGNOSIS — G4733 Obstructive sleep apnea (adult) (pediatric): Secondary | ICD-10-CM

## 2018-03-09 NOTE — Telephone Encounter (Signed)
Pt called nurse line, informed him a DME order has been sent to [facility], their number was given to patient.

## 2018-03-09 NOTE — Telephone Encounter (Signed)
Messaged with [facility] about new CPAP supples. Used misc DME order per their instructions. Please call Anthony [name] and ask him to call [facility] and let them know exactly what new supplies he needs - their number is  [phone]. Thanks!

## 2018-03-13 ENCOUNTER — Other Ambulatory Visit: Payer: Self-pay | Admitting: Family Medicine

## 2018-03-17 ENCOUNTER — Telehealth: Payer: Self-pay | Admitting: Cardiology

## 2018-03-17 ENCOUNTER — Ambulatory Visit (INDEPENDENT_AMBULATORY_CARE_PROVIDER_SITE_OTHER): Payer: Medicare Other | Admitting: *Deleted

## 2018-03-17 DIAGNOSIS — I428 Other cardiomyopathies: Secondary | ICD-10-CM | POA: Diagnosis not present

## 2018-03-17 NOTE — Progress Notes (Signed)
Remote ICD transmission.   

## 2018-03-17 NOTE — Telephone Encounter (Signed)
Spoke with pt and reminded pt of remote transmission that is due today. Pt verbalized understanding.   

## 2018-03-18 ENCOUNTER — Encounter: Payer: Self-pay | Admitting: Cardiology

## 2018-03-18 LAB — FECAL OCCULT BLOOD, IMMUNOCHEMICAL: Fecal Occult Bld: POSITIVE — AB

## 2018-03-20 ENCOUNTER — Telehealth: Payer: Self-pay | Admitting: Family Medicine

## 2018-03-20 DIAGNOSIS — R195 Other fecal abnormalities: Secondary | ICD-10-CM

## 2018-03-20 NOTE — Telephone Encounter (Signed)
LVM for pt to call the office. If pt calls, please give him the information below. Ottis Stain, CMA

## 2018-03-20 NOTE — Telephone Encounter (Signed)
Please call patient. His screening stool card was positive, meaning there could be some small blood in his stool. He will have to have a colonoscopy after all. I have placed GI referral and they will call him. Dr. Fuller Plan at [facility] was his previous GI.

## 2018-03-20 NOTE — Telephone Encounter (Signed)
Pt informed.  He also wanted to Let Dr.Timberlake know that he has not heard from [facility] about a CPAP mask.    Lamonta Cypress, Salome Spotted, CMA

## 2018-03-21 NOTE — Telephone Encounter (Signed)
I have sent another message to [facility] and I will let him know if there is an issue on our end.

## 2018-03-26 NOTE — Telephone Encounter (Signed)
Pt was informed but he has heard back from [facility].  He has an appt set up for a fitting soon. Altheria Shadoan, Salome Spotted, CMA

## 2018-03-26 NOTE — Telephone Encounter (Signed)
This is from [place] on 10/26:  This has been received. I have sent a message to our supply team to follow up on Monday. Thanks!   Please let Anthony [name] know. He can try calling [facility] to make sure his contact info with them is correct. Their number is [place], [place].......... [phone]

## 2018-03-26 NOTE — Telephone Encounter (Signed)
Have you heard back from [facility]?  Pt was calling to check status. [place], Salome Spotted, CMA

## 2018-03-31 ENCOUNTER — Other Ambulatory Visit: Payer: Self-pay | Admitting: Family Medicine

## 2018-04-01 ENCOUNTER — Ambulatory Visit: Payer: Medicare Other | Admitting: Podiatry

## 2018-04-01 ENCOUNTER — Encounter: Payer: Self-pay | Admitting: Podiatry

## 2018-04-01 ENCOUNTER — Encounter

## 2018-04-01 DIAGNOSIS — E119 Type 2 diabetes mellitus without complications: Secondary | ICD-10-CM | POA: Diagnosis not present

## 2018-04-01 DIAGNOSIS — M79609 Pain in unspecified limb: Secondary | ICD-10-CM

## 2018-04-01 DIAGNOSIS — M2010 Hallux valgus (acquired), unspecified foot: Secondary | ICD-10-CM | POA: Diagnosis not present

## 2018-04-01 DIAGNOSIS — B351 Tinea unguium: Secondary | ICD-10-CM | POA: Diagnosis not present

## 2018-04-01 DIAGNOSIS — Q6650 Congenital pes planus, unspecified foot: Secondary | ICD-10-CM

## 2018-04-01 NOTE — Progress Notes (Signed)
Patient ID: Anthony [name], male   DOB: Sep 26, 1950, 67 y.o.   MRN: [id number] Complaint:  Visit Type: Patient returns to my office for continued preventative foot care services. Complaint: Patient states" my nails have grown long and thick and become painful to walk and wear shoes" Patient has been diagnosed with DM with neuropathy.. The patient presents for preventative foot care services. No changes to ROS.    Podiatric Exam: Vascular: dorsalis pedis and posterior tibial pulses are palpable bilateral. Capillary return is immediate. Temperature gradient is WNL. Skin turgor WNL  Sensorium: Normal Semmes Weinstein monofilament test. Normal tactile sensation bilaterally. Nail Exam: Pt has thick disfigured discolored nails with subungual debris noted bilateral entire nail hallux through fifth toenails Ulcer Exam: There is no evidence of ulcer or pre-ulcerative changes or infection. Orthopedic Exam: Muscle tone and strength are WNL. No limitations in general ROM. No crepitus or effusions noted. HAV  B/L.  Pes planus. Amputation second toe left foot. Skin: No Porokeratosis. No infection or ulcers  Diagnosis:  Onychomycosis, , Pain in right toe, pain in left toes  Treatment & Plan Procedures and Treatment: Consent by patient was obtained for treatment procedures. The patient understood the discussion of treatment and procedures well. All questions were answered thoroughly reviewed. Debridement of mycotic and hypertrophic toenails, 1 through 5 bilateral and clearing of subungual debris. No ulceration, no infection noted. Patient qualifies for diabetic shoes for DPN  HAV and pes planus and amputation second toe left foot. Return Visit-Office Procedure: Patient instructed to return to the office for a follow up visit 3 months for continued evaluation and treatment.   Gardiner Barefoot DPM

## 2018-04-05 IMAGING — US US BIOPSY
1 series · 5 of 5 positions shown · non-contrast
Comparison: none

INDICATION: 66-year-old male with a history of amyloidosis

[Series 1: us biopsy · 0.06mm/px · 5 acquisitions, 5 frames shown]
[im 1/5]
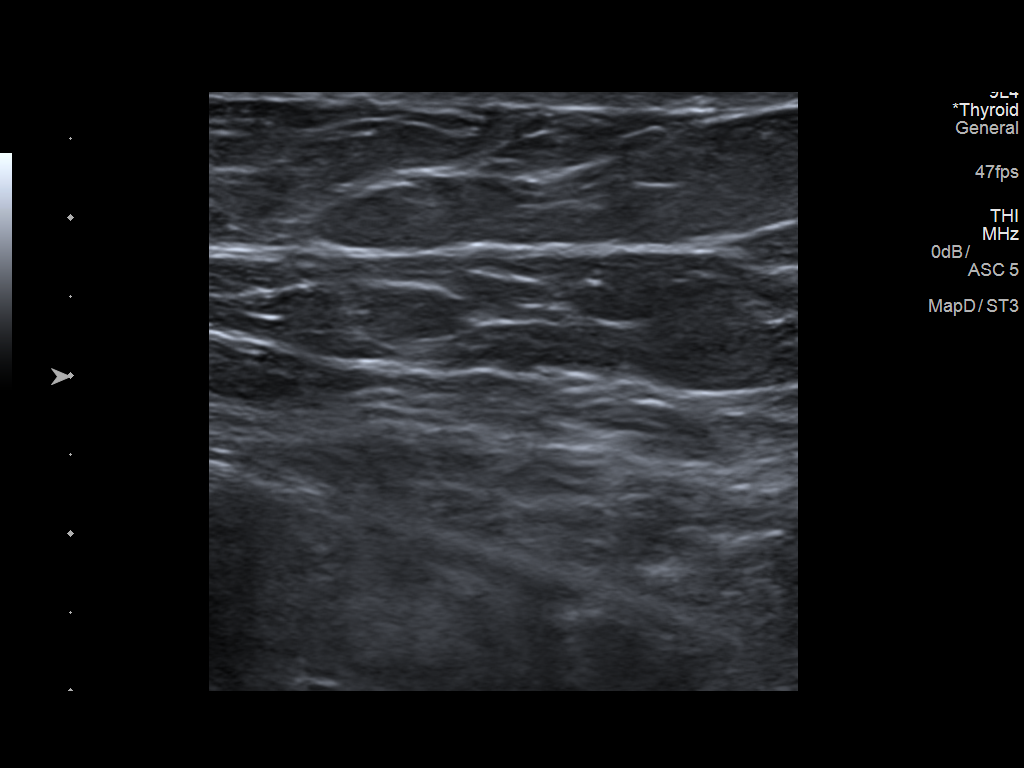
[im 2/5]
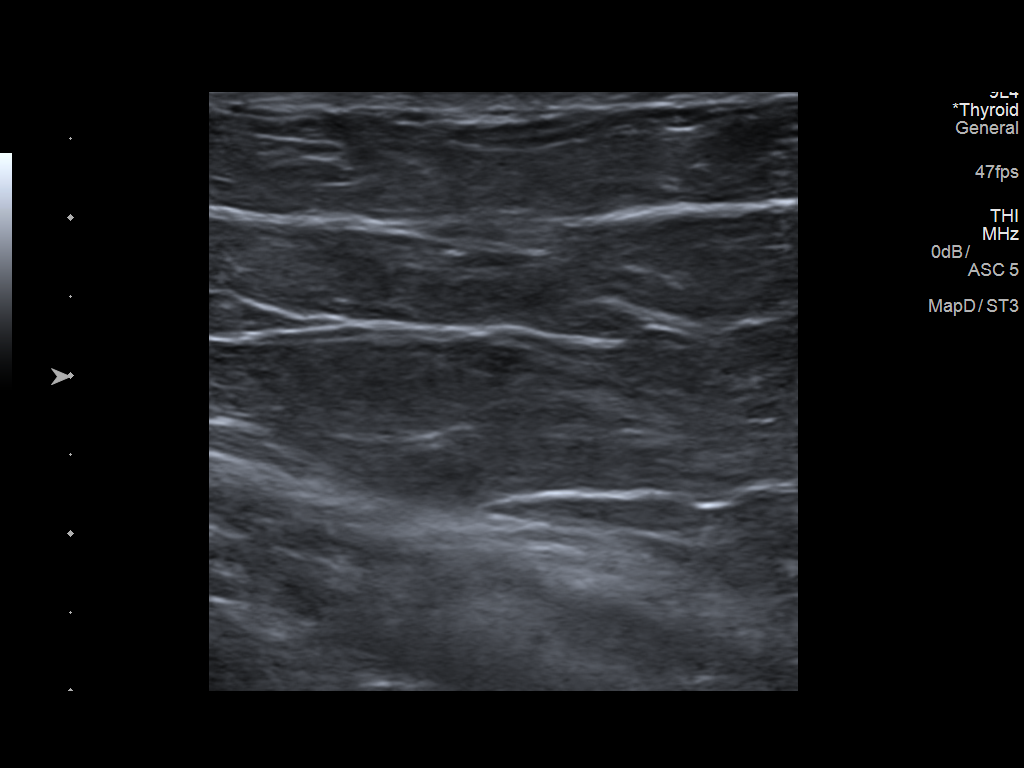
[im 3/5]
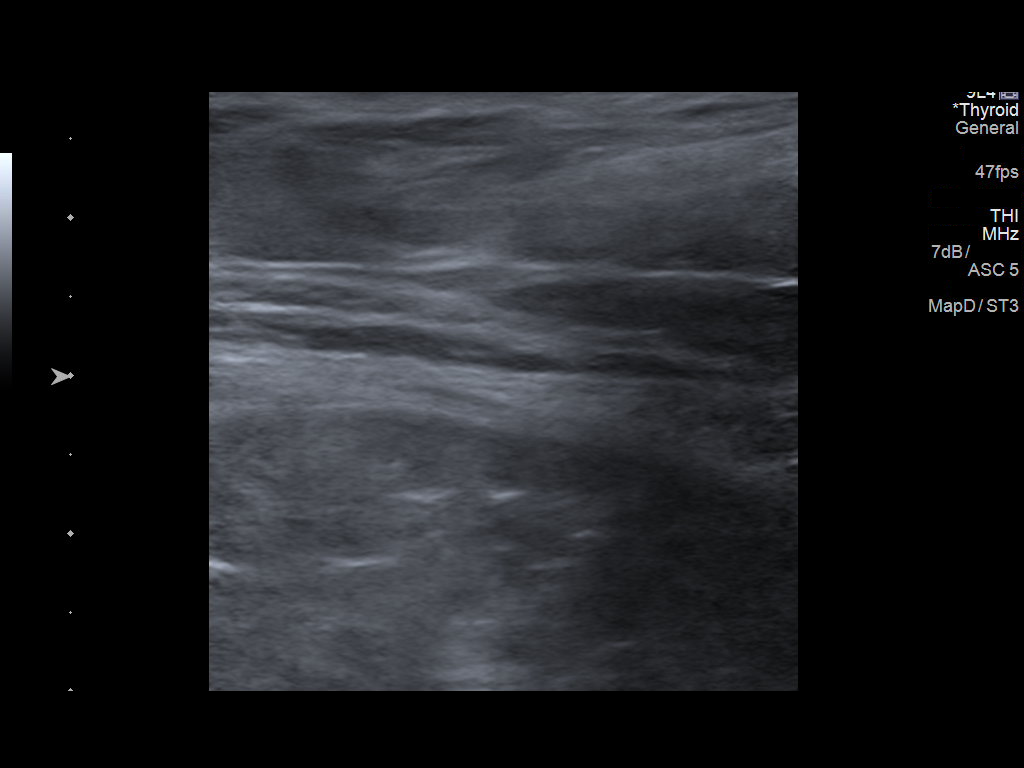
[im 4/5]
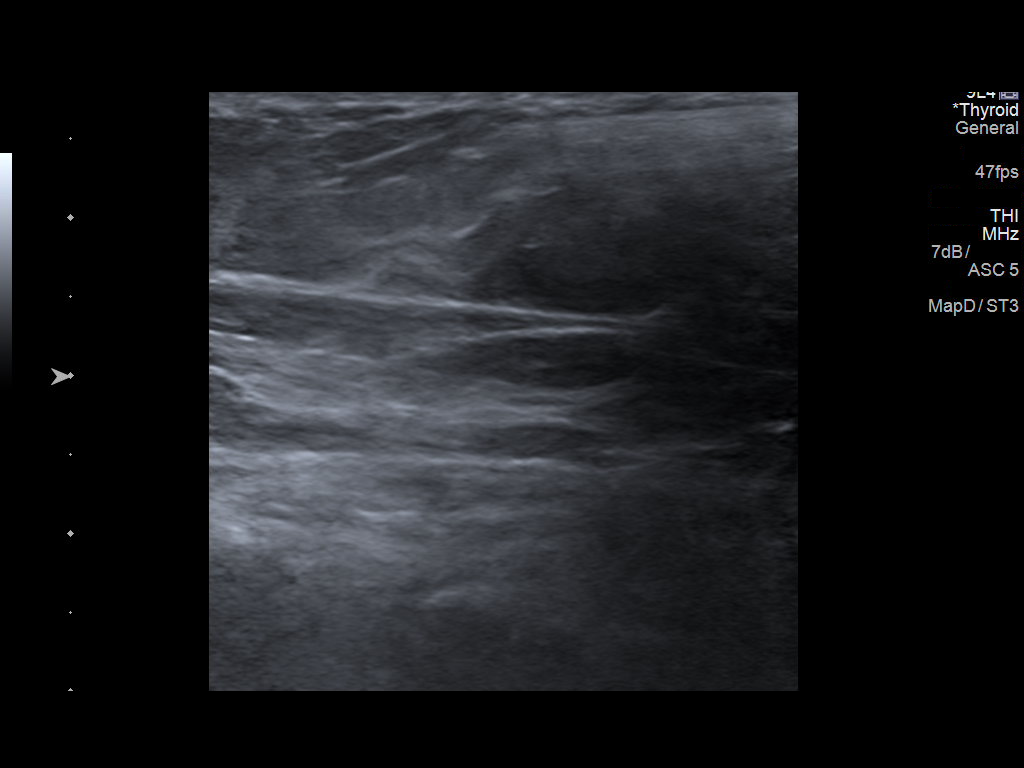
[im 5/5]
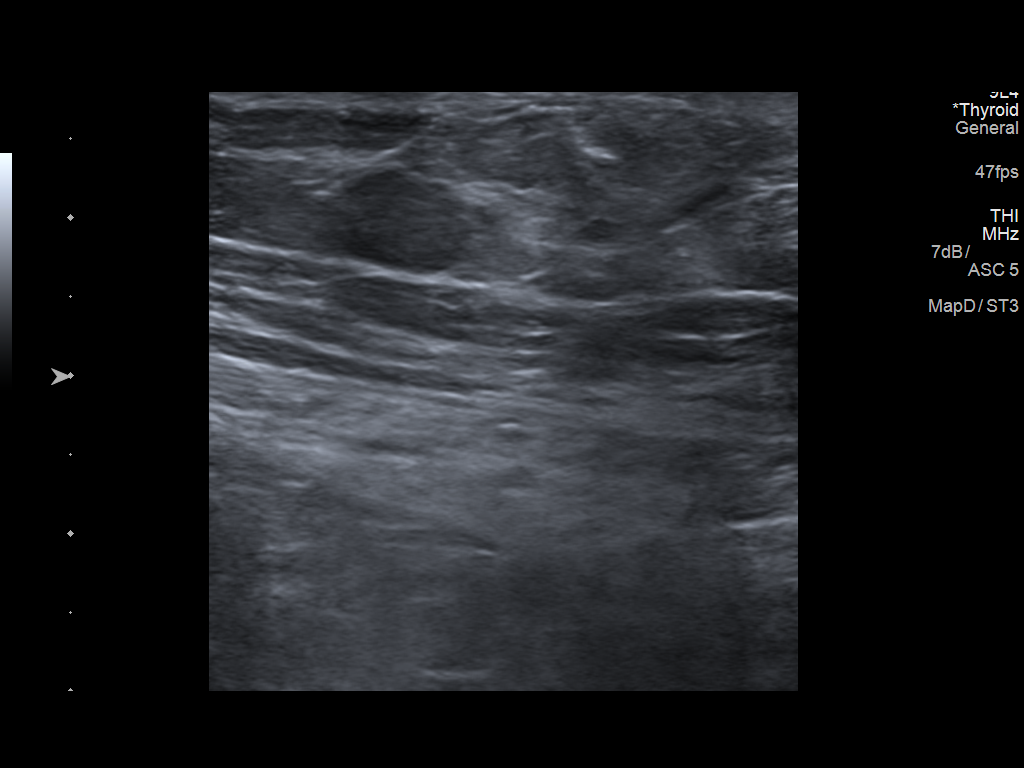

[5 of 5 positions shown; findings below may reference images not displayed]

EXAM:
ULTRASOUND-GUIDED FAT PAD BIOPSY OF ABDOMINAL WALL

MEDICATIONS:
None.

ANESTHESIA/SEDATION:
Moderate (conscious) sedation was employed during this procedure. A
total of Versed 2.0 mg and Fentanyl 75 mcg was administered
intravenously.

Moderate Sedation Time: 10 minutes. The patient's level of
consciousness and vital signs were monitored continuously by
radiology nursing throughout the procedure under my direct
supervision.

FLUOROSCOPY TIME:  None

COMPLICATIONS:
None

PROCEDURE:
Informed written consent was obtained from the patient and the
patient's family after a thorough discussion of the procedural
risks, benefits and alternatives. All questions were addressed.
Maximal Sterile Barrier Technique was utilized including caps, mask,
sterile gowns, sterile gloves, sterile drape, hand hygiene and skin
antiseptic. A timeout was performed prior to the initiation of the
procedure.

Ultrasound survey of the right abdomen was performed with images
stored and sent to PACs.

The patient is then prepped and draped in the usual sterile fashion.
The skin and subcutaneous tissues were generously infiltrated 1%
lidocaine for local anesthesia.

Small stab incision was made with 11 blade scalpel. Using ultrasound
guidance, an osteo site 11 gauge needle was advanced into the
subcutaneous fat, observed under ultrasound to acquire multiple
small adipose samples. Samples were placed into saline for
transportation to the lab.

Final ultrasound image was stored.  Sterile bandage was placed.

Patient tolerated the procedure well and remained hemodynamically
stable throughout.

No complications were encountered and no significant blood loss
encountered.
IMPRESSION: Status post ultrasound-guided biopsy of abdominal wall fat pad.
Tissue specimen sent to pathology for complete histopathologic
analysis.

## 2018-04-09 LAB — CUP PACEART REMOTE DEVICE CHECK
Battery Remaining Longevity: 52 mo
Battery Voltage: 2.97 V
Brady Statistic AP VP Percent: 0 %
Brady Statistic AP VS Percent: 0 %
Brady Statistic AS VP Percent: 0 %
Brady Statistic AS VS Percent: 0 %
Brady Statistic RA Percent Paced: 0 %
Brady Statistic RV Percent Paced: 98.64 %
Date Time Interrogation Session: 20191022183748
HighPow Impedance: 68 Ohm
Implantable Lead Implant Date: 20190301
Implantable Lead Implant Date: 20190301
Implantable Lead Location: 753858
Implantable Lead Location: 753860
Implantable Pulse Generator Implant Date: 20190301
Lead Channel Impedance Value: 1121 Ohm
Lead Channel Impedance Value: 1140 Ohm
Lead Channel Impedance Value: 1178 Ohm
Lead Channel Impedance Value: 1197 Ohm
Lead Channel Impedance Value: 1197 Ohm
Lead Channel Impedance Value: 1235 Ohm
Lead Channel Impedance Value: 313.212
Lead Channel Impedance Value: 317.612
Lead Channel Impedance Value: 327.681
Lead Channel Impedance Value: 340.944
Lead Channel Impedance Value: 346.164
Lead Channel Impedance Value: 399 Ohm
Lead Channel Impedance Value: 4047 Ohm
Lead Channel Impedance Value: 513 Ohm
Lead Channel Impedance Value: 608 Ohm
Lead Channel Impedance Value: 646 Ohm
Lead Channel Impedance Value: 665 Ohm
Lead Channel Impedance Value: 722 Ohm
Lead Channel Pacing Threshold Amplitude: 0.5 V
Lead Channel Pacing Threshold Amplitude: 2.5 V
Lead Channel Pacing Threshold Pulse Width: 0.4 ms
Lead Channel Pacing Threshold Pulse Width: 1.5 ms
Lead Channel Sensing Intrinsic Amplitude: 27.875 mV
Lead Channel Sensing Intrinsic Amplitude: 27.875 mV
Lead Channel Setting Pacing Amplitude: 2.5 V
Lead Channel Setting Pacing Amplitude: 3 V
Lead Channel Setting Pacing Pulse Width: 0.4 ms
Lead Channel Setting Pacing Pulse Width: 1.5 ms
Lead Channel Setting Sensing Sensitivity: 0.3 mV

## 2018-04-15 ENCOUNTER — Other Ambulatory Visit: Payer: Self-pay | Admitting: *Deleted

## 2018-04-15 NOTE — Patient Outreach (Signed)
Anthony [name]) Care Management  04/15/2018   NGHIA Anthony [name] 09/01/1950 [id number]  RN Health Coach telephone call to patient.  Hipaa compliance verified. per patient he is doing very good. Patient was outside raking leaves. Patient A1C has decreased to 6.8 from 7.2 . He is taking his mediations as per ordered. Per patient he is wearing his CPAP at night and is cleaning CPAP with so clean machine. Patient is scheduled for colonoscopy but have not heard from the center. RN called center and checked on scheduling. Patient has made good progress.Patient has agreed to follow up outreach calls.    Current Medications:  Current Outpatient Medications  Medication Sig Dispense Refill  . apixaban (ELIQUIS) 5 MG TABS tablet Take 1 tablet (5 mg total) by mouth 2 (two) times daily. 60 tablet 11  . B-D ULTRAFINE III SHORT PEN 31G X 8 MM MISC 1 UNITS BY DOES NOT APPLY ROUTE AS NEEDED.  11  . calcitRIOL (ROCALTROL) 0.25 MCG capsule TAKE 0.25 MG BY MOUTH daily  5  . furosemide (LASIX) 40 MG tablet Take 1 tablet (40 mg total) by mouth 2 (two) times daily. 180 tablet 3  . gabapentin (NEURONTIN) 300 MG capsule TAKE ONE CAPSULE BY MOUTH EVERY NIGHT AT BEDTIME 90 capsule 0  . hydrALAZINE (APRESOLINE) 25 MG tablet Take 1 tablet (25 mg total) by mouth 3 (three) times daily. 90 tablet 11  . Insulin Isophane & Regular Human (HUMULIN 70/30 KWIKPEN) (70-30) 100 UNIT/ML PEN Inject 50 Units into the skin daily. 15 mL 2  . KLOR-CON M20 20 MEQ tablet TAKE 1 TABLET BY MOUTH TWICE A DAY 180 tablet 1  . labetalol (NORMODYNE) 200 MG tablet TAKE 2 TABLETS (400 MG TOTAL) BY MOUTH 2 (TWO) TIMES DAILY. 120 tablet 11  . Multiple Vitamin (MULTIVITAMIN) tablet Take 1 tablet by mouth daily.      . nitroGLYCERIN (NITROSTAT) 0.4 MG SL tablet Place 0.4 mg under the tongue every 5 (five) minutes as needed for chest pain. If no relief by 3rd tab, call 911     . Omega 3 1000 MG CAPS Take 2,000 mg by mouth daily.     Marland Kitchen  omeprazole (PRILOSEC) 40 MG capsule TAKE ONE CAPSULE BY MOUTH DAILY 30 capsule 0  . oxymorphone (OPANA) 5 MG tablet Take 5 mg by mouth every 6 (six) hours as needed for pain (max 3 tablets daily). Per Dr Brien Few    . PARoxetine (PAXIL) 40 MG tablet TAKE ONE TABLET BY MOUTH EVERY MORNING 90 tablet 2  . polyethylene glycol (MIRALAX / GLYCOLAX) packet Take 17 g by mouth daily. 14 each 0  . rosuvastatin (CRESTOR) 5 MG tablet TAKE 1 TABLET BY MOUTH EVERY DAY 90 tablet 1  . Semaglutide (OZEMPIC) 0.25 or 0.5 MG/DOSE SOPN Inject 0.25 mg into the skin once a week. 1.5 mL 2   No current facility-administered medications for this visit.     Functional Status:  In your present state of health, do you have any difficulty performing the following activities: 04/15/2018 11/19/2017  Hearing? N N  Vision? N N  Difficulty concentrating or making decisions? N N  Walking or climbing stairs? N N  Dressing or bathing? N N  Doing errands, shopping? N N  Preparing Food and eating ? N N  Using the Toilet? N N  In the past six months, have you accidently leaked urine? N N  Do you have problems with loss of bowel control? N N  Managing your  Medications? N N  Managing your Finances? N N  Housekeeping or managing your Housekeeping? N N  Some recent data might be hidden    Fall/Depression Screening: Fall Risk  04/15/2018 03/05/2018 02/18/2018  Falls in the past year? 1 No Yes  Number falls in past yr: 1 - 1  Injury with Fall? 1 - Yes  Risk for fall due to : History of fall(s) - -  Risk for fall due to: Comment - - -  Follow up Falls evaluation completed - Falls evaluation completed  Comment - - -   PHQ 2/9 Scores 04/15/2018 03/05/2018 02/18/2018 12/01/2017 11/19/2017 09/02/2017 07/28/2017  PHQ - 2 Score 0 0 0 0 0 0 0  PHQ- 9 Score - - - - - - -  Exception Documentation - - - - - - -  Not completed - - - - - - -    Assessment:  A1C 6.8 from 7.2 Patient uses CPAP Patient is taking medications as  prescribed Patient is documenting blood sugars  Plan:  RN congratulated patient on decrease in A1C to 6.8 from 7.2 RN sent 2020 Calendar book for documentation RN sent EMMI on Taking care of yourself day to day RN sent EMMI Taking care of yourself year to year RN will follow up outreach within the moth of February 2020  [place] [facility] [phone]

## 2018-04-26 ENCOUNTER — Other Ambulatory Visit: Payer: Self-pay | Admitting: Family Medicine

## 2018-05-11 ENCOUNTER — Ambulatory Visit: Payer: Medicare Other | Admitting: Orthotics

## 2018-05-12 ENCOUNTER — Ambulatory Visit (INDEPENDENT_AMBULATORY_CARE_PROVIDER_SITE_OTHER): Payer: Medicare Other | Admitting: Orthotics

## 2018-05-12 DIAGNOSIS — Q6652 Congenital pes planus, left foot: Secondary | ICD-10-CM | POA: Diagnosis not present

## 2018-05-12 DIAGNOSIS — Q6650 Congenital pes planus, unspecified foot: Secondary | ICD-10-CM

## 2018-05-12 DIAGNOSIS — E119 Type 2 diabetes mellitus without complications: Secondary | ICD-10-CM

## 2018-05-12 DIAGNOSIS — M2011 Hallux valgus (acquired), right foot: Secondary | ICD-10-CM | POA: Diagnosis not present

## 2018-05-12 DIAGNOSIS — Q6651 Congenital pes planus, right foot: Secondary | ICD-10-CM | POA: Diagnosis not present

## 2018-05-12 DIAGNOSIS — M2012 Hallux valgus (acquired), left foot: Secondary | ICD-10-CM

## 2018-05-12 DIAGNOSIS — M2010 Hallux valgus (acquired), unspecified foot: Secondary | ICD-10-CM

## 2018-05-18 NOTE — Progress Notes (Signed)

## 2018-05-26 ENCOUNTER — Other Ambulatory Visit: Payer: Self-pay | Admitting: Family Medicine

## 2018-06-01 ENCOUNTER — Other Ambulatory Visit: Payer: Self-pay | Admitting: *Deleted

## 2018-06-01 NOTE — Patient Outreach (Signed)
[facility]) Care Management  06/01/2018  Anthony [name] September 08, 1950 [id number]   RN received telephone call from patient. Hipaa compliance verified. Patient requesting a pharmacist call and discuss with him about his medications. Per patient he wants to go over what all his medications that he is prescribed is for. Patient stated that he is having some side effects and he is not sure which one could be causing them. Per patient when he takes his morning meds he is immedicatedly tired and feels so sleepy that he has to go take an hour nap right after getting up. Then at night when he goes to bed he falls into a very deep and heavy deep sleep that he doesn't hear or wake up until the next day.  Plan referred to [facility] [facility] [phone]

## 2018-06-03 ENCOUNTER — Other Ambulatory Visit: Payer: Self-pay | Admitting: Pharmacist

## 2018-06-03 ENCOUNTER — Ambulatory Visit: Payer: Self-pay | Admitting: Pharmacist

## 2018-06-03 NOTE — Patient Outreach (Signed)
[place] [facility]) Care Management  [place]  06/03/2018  Anthony [name] [birth year] [id number]   Reason for call: medication management   Unsuccessful telephone call attempt #1 to patient.   HIPAA compliant voicemail left requesting a return call  Plan:  I will make another outreach attempt to patient within 3-4 business days  I will send unsuccessful letter   Regina Eck, PharmD, [place]  [phone]

## 2018-06-04 ENCOUNTER — Other Ambulatory Visit: Payer: Self-pay | Admitting: Pharmacist

## 2018-06-04 NOTE — Patient Outreach (Signed)
[place] [facility]) Care Management  [place]   06/04/2018  LUKA STOHR 1950/10/04 [id number]  Reason for referral: Medication Assistance with insulin/Medication Management  Outreach:  Successful telephone call with Mr. Savant.  HIPAA identifiers verified.  Patient states he is doing "okay" today.  He states he has been feeling drowsy and would like to review medications telephonically for potential causes.  He states has recently transitioned to the oxymorphone generic and states this may be the cause of his drowsiness.  Encouraged patient to follow up with pain MD to clarify his dosing and to see if it can be reduced to help with sedation while still controlling pain.  Patient's gabapentin is low dose and he has been on this medication "for sometime".  It is unlikely that gabapentin is the cause of sedation.  He states he has been stable on hydralazine and labetolol for his BP.  No other medications are well-known offenders for sedation.   Encouraged patient to reach out to PCP if he continues to experience sedation or has any other side effects from medication.  Objective: Lab Results  Component Value Date   CREATININE 2.49 (H) 09/25/2017   CREATININE 2.38 (H) 09/11/2017   CREATININE 2.50 (H) 09/02/2017    Lab Results  Component Value Date   HGBA1C 6.8 03/05/2018    Lipid Panel     Component Value Date/Time   CHOL 108 12/24/2016 1313   TRIG 89 12/24/2016 1313   HDL 35 (L) 12/24/2016 1313   CHOLHDL 3.1 12/24/2016 1313   CHOLHDL 3.2 02/28/2016 0906   VLDL 19 02/28/2016 0906   LDLCALC 55 12/24/2016 1313   LDLDIRECT 58 11/06/2011 1434    BP Readings from Last 3 Encounters:  03/05/18 118/62  12/16/17 140/88  12/01/17 122/72    Allergies  Allergen Reactions  . Enalapril Maleate Cough    Medications Reviewed Today    Reviewed by Lavera Guise, [facility] (Pharmacist) on 06/04/18 at [place] List Status: <None>  Medication Order Taking? Sig Documenting  Provider Last Dose Status Informant  apixaban (ELIQUIS) 5 MG TABS tablet [id number] Yes Take 1 tablet (5 mg total) by mouth 2 (two) times daily. Deboraha Sprang, MD Taking Active Self           Med Note Modena Nunnery, Ernest Mallick Aug 13, 2017 11:43 AM)    B-D ULTRAFINE III SHORT PEN 31G X 8 MM MISC [id number] Yes 1 UNITS BY DOES NOT APPLY ROUTE AS NEEDED. [provider] Taking Active   calcitRIOL (ROCALTROL) 0.25 MCG capsule [id number] Yes TAKE 0.25 MG BY MOUTH daily [provider] Taking Active Self  furosemide (LASIX) 40 MG tablet [id number]  Take 1 tablet (40 mg total) by mouth 2 (two) times daily. Deboraha Sprang, MD  Expired 02/12/18 2359   gabapentin (NEURONTIN) 300 MG capsule [id number] Yes TAKE ONE CAPSULE BY MOUTH EVERY NIGHT AT BEDTIME Everrett Coombe, MD Taking Active   hydrALAZINE (APRESOLINE) 25 MG tablet [id number] Yes Take 1 tablet (25 mg total) by mouth 3 (three) times daily. Deboraha Sprang, MD Taking Active   Insulin Isophane & Regular Human (HUMULIN 70/30 KWIKPEN) (70-30) 100 UNIT/ML PEN [id number] Yes Inject 50 Units into the skin daily. Sela Hilding, MD Taking Active   KLOR-CON M20 20 MEQ tablet [id number] Yes TAKE 1 TABLET BY MOUTH TWICE A DAY Consuelo Pandy, PA-C Taking Active   labetalol (NORMODYNE) 200 MG tablet [id number] Yes TAKE 2 TABLETS (400 MG  TOTAL) BY MOUTH 2 (TWO) TIMES DAILY. Patsey Berthold, NP Taking Active   Multiple Vitamin (MULTIVITAMIN) tablet [id number] Yes Take 1 tablet by mouth daily.   [provider] Taking Active Self  nitroGLYCERIN (NITROSTAT) 0.4 MG SL tablet [id number] Yes Place 0.4 mg under the tongue every 5 (five) minutes as needed for chest pain. If no relief by 3rd tab, call 911  [provider] Taking Active Self           Med Note Ala Dach   Wed Aug 20, 2017 10:08 AM) Never used  Omega 3 1000 MG CAPS [id number] Yes Take 2,000 mg by mouth daily.  [provider] Taking Active Self  omeprazole  (PRILOSEC) 40 MG capsule [id number] Yes TAKE ONE CAPSULE BY MOUTH DAILY Sela Hilding, MD Taking Active   oxymorphone (OPANA) 5 MG tablet [id number] Yes Take 5 mg by mouth every 6 (six) hours as needed for pain (max 3 tablets daily). Per Dr Alvin Critchley, MD Taking Active Self  PARoxetine (PAXIL) 40 MG tablet [id number] Yes TAKE ONE TABLET BY MOUTH EVERY MORNING Sela Hilding, MD Taking Active   polyethylene glycol [facility] / GLYCOLAX) packet [id number] Yes Take 17 g by mouth daily. Kathrene Alu, MD Taking Active Self  rosuvastatin (CRESTOR) 5 MG tablet [id number] Yes TAKE 1 TABLET BY MOUTH EVERY DAY Lyda Jester M, PA-C Taking Active   Semaglutide [facility]) 0.25 or 0.5 MG/DOSE SOPN [id number]  Inject 0.25 mg into the skin once a week. Sela Hilding, MD  Active   Med List Note Wynona Canes, CPhT 08/13/17 1152): CPAP          Assessment:  Drugs sorted by system:  Neurologic/Psychologic: gabapentin, paroxetine  Cardiovascular: labetalol, hydralazine, furosemide, apixaban, fish oil, rosuvastatin  Gastrointestinal: omeprazole, Miralax  Endocrine: Novolin 70/30 PEN, Ozempic (GLP1), calcitriol  Pain: oxymorphone  Vitamins/Minerals/Supplements: KlorCon (potassium)  Medication Review Findings:  . A1c is 6.8 from 02/2018.  Patient has been well controlled with the addition of Ozempic.  Will be assisting patient with patient assistance and potentially switching to Lilly products (Humulin 70/35 pen and Trulicity) if PCP deems appropriate.   Plan: -I will follow up with patient in 1 weeks to assess side effects -I will follow up with PAPs for medication assistance  Regina Eck, PharmD, [place]  [phone]

## 2018-06-08 ENCOUNTER — Other Ambulatory Visit: Payer: Self-pay | Admitting: Family Medicine

## 2018-06-08 ENCOUNTER — Ambulatory Visit: Payer: Self-pay | Admitting: Pharmacist

## 2018-06-11 ENCOUNTER — Ambulatory Visit: Payer: Self-pay | Admitting: Pharmacist

## 2018-06-11 ENCOUNTER — Other Ambulatory Visit: Payer: Self-pay

## 2018-06-11 ENCOUNTER — Other Ambulatory Visit: Payer: Self-pay | Admitting: Pharmacist

## 2018-06-11 NOTE — Patient Outreach (Signed)
  [place] [facility]) Care Management Chronic Special Needs Program  06/11/2018  Name: Anthony [name] DOB: May 26, 1951  MRN: [id number]  [name] is enrolled in a chronic special needs plan for Heart Failure  Chronic Care Management Coordinator telephoned client to review health risk assessment and to develop individualized care plan.  Introduced the chronic care management program, importance of client participation, and taking their care plan to all provider appointments and inpatient facilities.  Reviewed the transition of care process and possible referral to community care management.  Subjective:client reports his A1C has decreased. He reports he is having trouble getting medications Humulin 70/30 and Elliquis. Client is also interested in glucose meters and co-pay amount if any. Client reports he quite smoking October 2019, but declines any smoking cessation material. Client reports he has completed advanced directives. RNCM encouraged client to take copy to his provider/hospital. Client denies having trouble obtaining food. Client states he is now eating a more balanced diet and denies going hungry or having difficulty getting food.  Goals Addressed            This Visit's Progress   . Client understands the importance of follow-up with providers by attending scheduled visits      . COMPLETED: Client will have food insecurities addressed by 3 months.      . Client will use Assistive Devices as needed and verbalize understanding of device use      . Client will verbalize knowledge of self management of Hypertension as evidences by BP reading of 140/90 or less; or as defined by provider      . HEMOGLOBIN A1C < 7.0      . Maintain timely refills of diabetic medication as prescribed within the year .      . Maintain timely refills of Heart Failure medication as prescribed within the year       . Obtain annual  Lipid Profile, LDL-C      . Obtain Annual Eye (retinal)   Exam       . Obtain Annual Foot Exam      . Obtain annual screen for micro albuminuria (urine) , nephropathy (kidney problems)      . Obtain Hemoglobin A1C at least 2 times per year      . Visit Primary Care Provider or Cardiologist at least 2 times per year      . Visit Primary Care Provider or Endocrinologist at least 2 times per year          Plan:  Send successful outreach letter with a copy of their individualized care plan and Send individual care plan to provider  Chronic care management coordination will outreach in:  3 Months Currently working with [facility] pharmacist, Lottie Dawson. RNCM will update              [name], RN, MSN, [place] [phone]

## 2018-06-11 NOTE — Patient Outreach (Deleted)
[facility]) Care Management  [place]   06/11/2018  AJIT ERRICO April 27, 1951 [id number]  Reason for referral: Medication Management  Referral source: [facility] RN Current insurance:[employer]  PMHx includes but not limited to:  HTN, CAD, Afib, GERD, IDDM, HLD  Outreach:  Successful telephone call with Mr. Eichel.  HIPAA identifiers verified.  Patient agreeable to review medications telephonically.  He states he has been fe                                                                                       eling drowsy for the past few weeks.   Subjective:  ***   Objective: Lab Results  Component Value Date   CREATININE 2.49 (H) 09/25/2017   CREATININE 2.38 (H) 09/11/2017   CREATININE 2.50 (H) 09/02/2017    Lab Results  Component Value Date   HGBA1C 6.8 03/05/2018    Lipid Panel     Component Value Date/Time   CHOL 108 12/24/2016 1313   TRIG 89 12/24/2016 1313   HDL 35 (L) 12/24/2016 1313   CHOLHDL 3.1 12/24/2016 1313   CHOLHDL 3.2 02/28/2016 0906   VLDL 19 02/28/2016 0906   LDLCALC 55 12/24/2016 1313   LDLDIRECT 58 11/06/2011 1434    BP Readings from Last 3 Encounters:  03/05/18 118/62  12/16/17 140/88  12/01/17 122/72    Allergies  Allergen Reactions  . Enalapril Maleate Cough    Medications Reviewed Today    Reviewed by Lavera Guise, [facility] (Pharmacist) on 06/11/18 at [phone]  Med List Status: <None>  Medication Order Taking? Sig Documenting Provider Last Dose Status Informant  apixaban (ELIQUIS) 5 MG TABS tablet [id number] Yes Take 1 tablet (5 mg total) by mouth 2 (two) times daily. Deboraha Sprang, MD Taking Active Self           Med Note Modena Nunnery, Ernest Mallick Aug 13, 2017 11:43 AM)    B-D ULTRAFINE III SHORT PEN 31G X 8 MM MISC [id number] Yes 1 UNITS BY DOES NOT APPLY ROUTE AS NEEDED. [provider] Taking Active   calcitRIOL (ROCALTROL) 0.25 MCG capsule [id number] Yes TAKE 0.25 MG BY MOUTH daily [provider] Taking Active Self  furosemide (LASIX) 40 MG tablet [id number]  Take 1 tablet (40 mg total) by mouth 2 (two) times daily. Deboraha Sprang, MD  Expired 02/12/18 2359   gabapentin (NEURONTIN) 300 MG capsule [id number] Yes TAKE ONE CAPSULE BY MOUTH EVERY NIGHT AT BEDTIME Everrett Coombe, MD Taking Active   hydrALAZINE (APRESOLINE) 25 MG tablet [id number] Yes Take 1 tablet (25 mg total) by mouth 3 (three) times daily. Deboraha Sprang, MD Taking Active   Insulin Isophane & Regular Human (HUMULIN 70/30 MIX) (70-30) 100 UNIT/ML PEN [id number] Yes INJECT 50 UNITS INTO THE SKIN DAILY. Sela Hilding, MD Taking Active   KLOR-CON M20 20 MEQ tablet [id number] Yes TAKE 1 TABLET BY MOUTH TWICE A DAY Lyda Jester M, PA-C Taking Active   labetalol (NORMODYNE) 200 MG tablet [id number] Yes TAKE 2 TABLETS (400 MG TOTAL) BY MOUTH 2 (TWO) TIMES DAILY. Patsey Berthold, NP Taking Active  Multiple Vitamin (MULTIVITAMIN) tablet [id number] Yes Take 1 tablet by mouth daily.   [provider] Taking Active Self  nitroGLYCERIN (NITROSTAT) 0.4 MG SL tablet [id number] Yes Place 0.4 mg under the tongue every 5 (five) minutes as needed for chest pain. If no relief by 3rd tab, call 911  [provider] Taking Active Self           Med Note Ala Dach   Wed Aug 20, 2017 10:08 AM) Never used  Omega 3 1000 MG CAPS [id number] Yes Take 2,000 mg by mouth daily.  [provider] Taking Active Self  omeprazole (PRILOSEC) 40 MG capsule [id number] Yes TAKE ONE CAPSULE BY MOUTH DAILY Sela Hilding, MD Taking Active   oxymorphone (OPANA) 5 MG tablet [id number] Yes Take 5 mg by mouth every 6 (six) hours as needed for pain (max 3 tablets daily). Per Dr Alvin Critchley, MD Taking Active Self  PARoxetine (PAXIL) 40 MG tablet [id number] Yes TAKE ONE TABLET BY MOUTH EVERY MORNING Sela Hilding, MD Taking Active   polyethylene glycol [facility] / GLYCOLAX) packet [id number] Yes Take 17 g by mouth  daily. Kathrene Alu, MD Taking Active Self  rosuvastatin (CRESTOR) 5 MG tablet [id number] Yes TAKE 1 TABLET BY MOUTH EVERY DAY Lyda Jester M, PA-C Taking Active   Semaglutide [facility]) 0.25 or 0.5 MG/DOSE SOPN [id number] Yes Inject 0.25 mg into the skin once a week. Sela Hilding, MD Taking Active   Med List Note Jaymes Graff 08/13/17 1152): CPAP          Assessment:  Drugs sorted by system:  Neurologic/Psychologic:  Cardiovascular:  Pulmonary/Allergy:  Gastrointestinal:  Endocrine:  Renal:  Topical:  Pain:  Infectious Diseases:  Oncology:  Genitourinary:  Vitamins/Minerals/Supplements:  Miscellaneous:  Medication Review Findings:  . ***   Medication Assistance Findings:  {Medication assistance THN:22160}  Extra Help:   '[]'  Already receiving Full Extra Help  '[]'  Already receiving Partial Extra Help  '[]'  Eligible based on reported income and assets  '[]'  Not Eligible based on reported income and assets  Patient Assistance Programs: 1) *** made by *** o Income requirement met: '[]'  Yes '[]'  No '[]'  Unknown o Out-of-pocket prescription expenditure met:    '[]'  Yes '[]'  No  '[]'  Unknown  '[]'  Not applicable - {THN patient assistance program assessment:22161:s}   Plan: -Will follow up with patient next week to review medications and side effects  Regina Eck, PharmD, [place]  [phone]

## 2018-06-11 NOTE — Patient Outreach (Signed)
[place] [facility]) Care Management  06/11/2018  Anthony [name] April 25, 1951 [id number]   Successful outreach call to [name] with HIPAA identifiers verified.  Patient states he is doing well today.  He has spaced out his oxymorphone tablets and is now only taking 2 tablets daily.  His drowsiness has subsided and his pain is controlled. Patient stated his insulin pens were $90 at [facility] (filled on 06/08/2018).  Call placed to [facility] [phone]) and technician confirmed that 30-day supply of insulin pens was $90. Emailed HTA for additional support.  Patient has enough insulin for today and tomorrow.  Will follow up with him tomorrow.  Anthony [name], PharmD, [place]  [phone]

## 2018-06-12 ENCOUNTER — Telehealth: Payer: Self-pay

## 2018-06-12 ENCOUNTER — Other Ambulatory Visit: Payer: Self-pay | Admitting: Pharmacist

## 2018-06-12 ENCOUNTER — Other Ambulatory Visit: Payer: Self-pay | Admitting: *Deleted

## 2018-06-12 NOTE — Patient Outreach (Signed)
[place] [facility]) Care Management  06/12/2018  Anthony [name] 1950-09-13 [id number]  RN Health Coach telephone call to patient.  Hipaa compliance verified. 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  [place] Care Management [phone]

## 2018-06-12 NOTE — Telephone Encounter (Signed)
[name] [facility] pharmacist called. Patient insurance formulary has changed.  Please D/C Humalog pen and send Novolog pen with same directions to [facility].  [name] call back is [phone]  Danley Danker, RN [facility] RN)

## 2018-06-15 ENCOUNTER — Ambulatory Visit: Payer: Self-pay | Admitting: Pharmacist

## 2018-06-15 ENCOUNTER — Telehealth: Payer: Self-pay

## 2018-06-15 MED ORDER — INSULIN NPH ISOPHANE & REGULAR (70-30) 100 UNIT/ML ~~LOC~~ SUSP: 50.0000 [IU] | Freq: Every day | SUBCUTANEOUS | 11 refills | Status: DC

## 2018-06-15 MED ORDER — ONETOUCH VERIO W/DEVICE KIT: PACK | 0 refills | Status: DC

## 2018-06-15 NOTE — Telephone Encounter (Signed)
Called julie to clarify - I think it should be changing humalin to novalin - he is on 70/30 as far as I know, not humalog which is short acting only. Encouraged her to call back or message back thru epic

## 2018-06-15 NOTE — Telephone Encounter (Signed)
[name] called back to confirm- I switched humalin to novolin at 50 units.

## 2018-06-15 NOTE — Telephone Encounter (Signed)
Patient request Rx for new glucometer.  According to our diabetic supply paper, [facility] uses One Touch Verio  Call back is [phone]  Danley Danker, RN [facility] RN)

## 2018-06-17 ENCOUNTER — Ambulatory Visit (INDEPENDENT_AMBULATORY_CARE_PROVIDER_SITE_OTHER): Payer: HMO | Admitting: Nurse Practitioner

## 2018-06-17 ENCOUNTER — Encounter: Payer: Medicare Other | Admitting: Nurse Practitioner

## 2018-06-17 ENCOUNTER — Other Ambulatory Visit: Payer: Self-pay

## 2018-06-17 ENCOUNTER — Encounter: Payer: Self-pay | Admitting: Nurse Practitioner

## 2018-06-17 ENCOUNTER — Telehealth: Payer: Self-pay | Admitting: Family Medicine

## 2018-06-17 VITALS — BP 142/68 | HR 85 | Ht 73.0 in | Wt 245.0 lb

## 2018-06-17 DIAGNOSIS — I428 Other cardiomyopathies: Secondary | ICD-10-CM | POA: Diagnosis not present

## 2018-06-17 DIAGNOSIS — I10 Essential (primary) hypertension: Secondary | ICD-10-CM

## 2018-06-17 DIAGNOSIS — I4821 Permanent atrial fibrillation: Secondary | ICD-10-CM

## 2018-06-17 DIAGNOSIS — I5022 Chronic systolic (congestive) heart failure: Secondary | ICD-10-CM

## 2018-06-17 LAB — CUP PACEART INCLINIC DEVICE CHECK
Date Time Interrogation Session: 20200122111249
Implantable Lead Implant Date: 20190301
Implantable Lead Implant Date: 20190301
Implantable Lead Location: 753858
Implantable Lead Location: 753860
Implantable Pulse Generator Implant Date: 20190301

## 2018-06-17 MED ORDER — INSULIN ISOPHANE & REGULAR (HUMAN 70-30)100 UNIT/ML KWIKPEN: 50.0000 [IU] | PEN_INJECTOR | Freq: Every day | SUBCUTANEOUS | 11 refills | Status: DC

## 2018-06-17 MED ORDER — GLUCOSE BLOOD VI STRP: ORAL_STRIP | 12 refills | Status: DC

## 2018-06-17 NOTE — Patient Outreach (Signed)
  [place] [facility]) Care Management Chronic Special Needs Program   06/17/2018  Name: Nadir, Vasques: 02-01-1951  MRN: [id number]  The client was discussed in today's interdisciplinary care team meeting.  The following issues were discussed:  Client's needs, Key risk triggers/risk stratification, Care Plan and Coordination of care  Participants present:  Thea Silversmith, MSN, RN, CCM; Peter Garter RN, BSN, CCM, CDE; Dr. Maryella Shivers; Dr. Marco Collie  Plan:  Continue education reinforcement for disease processes.  Follow up: 3 months as previously scheduled.   Thea Silversmith, RN, MSN, [place] [phone]

## 2018-06-17 NOTE — Telephone Encounter (Signed)
Changing novalin vial to pens - will also send one touch verio strips. Greatly appreciate [facility] pharmacy help in getting Mr. Borjon affordable meds!

## 2018-06-17 NOTE — Progress Notes (Signed)
Electrophysiology Office Note Date: 06/17/2018  ID:  Anthony [name], Anthony [name] 06, 1952, MRN [id number]  PCP: Sela Hilding, MD Primary Cardiologist: Meda Coffee Electrophysiologist: Caryl Comes  CC: Routine ICD follow-up  Anthony [name] is a 68 y.o. male seen today for Dr Caryl Comes.  He presents today for routine electrophysiology followup.  Since last being seen in our clinic, the patient reports doing very well. He denies chest pain, palpitations, dyspnea, PND, orthopnea, nausea, vomiting, dizziness, syncope, edema, weight gain, or early satiety.  He has not had ICD shocks.   Device History: MDT CRTD implanted 2019 for HF, AF with RVR with concomitant AVN ablation History of appropriate therapy: No History of AAD therapy: No   Past Medical History:  Diagnosis Date  . Arthritis   . CAD in native artery    a. reported MI 2000, 2001, s/p stenting of the circumflex lesion extending into the second obtuse marginal branch with a drug-eluting stent in 2003. b. low risk nuc 2015.  Marland Kitchen Chronic atrial fibrillation   . Chronic combined systolic and diastolic CHF (congestive heart failure) ([place])    a. Previously diastolic, then EF 28-31% in 10/2016.  . CKD (chronic kidney disease), stage III ([place])   . Depression   . Diabetes mellitus   . Edema of extremities   . Erectile dysfunction   . GERD (gastroesophageal reflux disease)   . Hyperlipidemia   . Hypertension   . Myocardial infarction ([place]) 2000&2001  . Obesity   . Onychomycosis   . OSA (obstructive sleep apnea)   . S/P ICD (internal cardiac defibrillator) procedure and BiV device,  07/25/17 Medtronic  07/26/2017  . Seizures ([place])   . Tubular adenoma of colon 10/2002   Past Surgical History:  Procedure Laterality Date  . ANGIOPLASTY  2001   stent x 1  . AV NODE ABLATION N/A 08/20/2017   Procedure: AV NODE ABLATION;  Surgeon: Deboraha Sprang, MD;  Location: [facility];  Service: Cardiovascular;  Laterality: N/A;  . BIV ICD INSERTION  CRT-D N/A 07/25/2017   Procedure: BIV ICD INSERTION CRT-D;  Surgeon: Deboraha Sprang, MD;  Location: [facility];  Service: Cardiovascular;  Laterality: N/A;  . COLONOSCOPY  2000?   negative  . FOOT ARTHROTOMY Right   . TOE AMPUTATION Left 2012    Current Outpatient Medications  Medication Sig Dispense Refill  . apixaban (ELIQUIS) 5 MG TABS tablet Take 1 tablet (5 mg total) by mouth 2 (two) times daily. 60 tablet 11  . B-D ULTRAFINE III SHORT PEN 31G X 8 MM MISC 1 UNITS BY DOES NOT APPLY ROUTE AS NEEDED.  11  . Blood Glucose Monitoring Suppl (ONETOUCH VERIO) w/Device KIT Use kit to check blood sugars. 1 kit 0  . calcitRIOL (ROCALTROL) 0.25 MCG capsule TAKE 0.25 MG BY MOUTH daily  5  . gabapentin (NEURONTIN) 300 MG capsule TAKE ONE CAPSULE BY MOUTH EVERY NIGHT AT BEDTIME 90 capsule 0  . glucose blood (ONETOUCH VERIO) test strip Use as instructed 100 each 12  . hydrALAZINE (APRESOLINE) 25 MG tablet Take 1 tablet (25 mg total) by mouth 3 (three) times daily. 90 tablet 11  . Insulin Isophane & Regular Human (NOVOLIN 70/30 FLEXPEN) (70-30) 100 UNIT/ML PEN Inject 50 Units into the skin daily. 15 mL 11  . KLOR-CON M20 20 MEQ tablet TAKE 1 TABLET BY MOUTH TWICE A DAY 180 tablet 1  . labetalol (NORMODYNE) 200 MG tablet TAKE 2 TABLETS (400 MG TOTAL) BY MOUTH  2 (TWO) TIMES DAILY. 120 tablet 11  . Multiple Vitamin (MULTIVITAMIN) tablet Take 1 tablet by mouth daily.      . nitroGLYCERIN (NITROSTAT) 0.4 MG SL tablet Place 0.4 mg under the tongue every 5 (five) minutes as needed for chest pain. If no relief by 3rd tab, call 911     . omeprazole (PRILOSEC) 40 MG capsule TAKE ONE CAPSULE BY MOUTH DAILY 90 capsule 3  . oxymorphone (OPANA) 5 MG tablet Take 5 mg by mouth every 6 (six) hours as needed for pain (max 3 tablets daily). Per Dr Brien Few    . PARoxetine (PAXIL) 40 MG tablet TAKE ONE TABLET BY MOUTH EVERY MORNING 90 tablet 2  . polyethylene glycol (MIRALAX / GLYCOLAX) packet Take 17 g by mouth  daily. 14 each 0  . rosuvastatin (CRESTOR) 5 MG tablet TAKE 1 TABLET BY MOUTH EVERY DAY 90 tablet 1  . Semaglutide (OZEMPIC) 0.25 or 0.5 MG/DOSE SOPN Inject 0.25 mg into the skin once a week. 1.5 mL 2  . furosemide (LASIX) 40 MG tablet Take 1 tablet (40 mg total) by mouth 2 (two) times daily. 180 tablet 3   No current facility-administered medications for this visit.     Allergies:   Enalapril maleate   Social History: Social History   Socioeconomic History  . Marital status: Married    Spouse name: [name]  . Number of children: 2  . Years of education: GED  . Highest education level: Not on file  Occupational History  . Occupation: CUSTOMER SERVICES    Employer: [employer]    Comment: [employer]  Social Needs  . Financial resource strain: Not on file  . Food insecurity:    Worry: Not on file    Inability: Not on file  . Transportation needs:    Medical: Not on file    Non-medical: Not on file  Tobacco Use  . Smoking status: Former Smoker    Packs/day: 0.25    Years: 45.00    Pack years: 11.25    Types: Cigarettes    Start date: 05/28/1967    Last attempt to quit: 06/11/2016    Years since quitting: 2.0  . Smokeless tobacco: Never Used  Substance and Sexual Activity  . Alcohol use: No    Comment: quit in 1993  . Drug use: No  . Sexual activity: Not on file  Lifestyle  . Physical activity:    Days per week: Not on file    Minutes per session: Not on file  . Stress: Not on file  Relationships  . Social connections:    Talks on phone: Not on file    Gets together: Not on file    Attends religious service: Not on file    Active member of club or organization: Not on file    Attends meetings of clubs or organizations: Not on file    Relationship status: Not on file  . Intimate partner violence:    Fear of current or ex partner: Not on file    Emotionally abused: Not on file    Physically abused: Not on file    Forced sexual activity: Not on file  Other Topics Concern    . Not on file  Social History Narrative   Patient is right handed, consumes caffeine rarely. Patient resides in home with wife.    Family History: Family History  Problem Relation Age of Onset  . Heart attack Mother   . Heart attack Father   .  Colon cancer Neg Hx   . Stomach cancer Neg Hx     Review of Systems: All other systems reviewed and are otherwise negative except as noted above.   Physical Exam: VS:  BP (!) 142/68   Pulse 85   Ht _0  (1.854 m)   Wt 245 lb (111.1 kg)   SpO2 99%   BMI 32.32 kg/m  , BMI Body mass index is 32.32 kg/m.  GEN- The patient is well appearing, alert and oriented x 3 today.   HEENT: normocephalic, atraumatic; sclera clear, conjunctiva pink; hearing intact; oropharynx clear; neck supple  Lungs- Clear to ausculation bilaterally, normal work of breathing.  No wheezes, rales, rhonchi Heart- Regular rate and rhythm (paced) GI- soft, non-tender, non-distended, bowel sounds present  Extremities- no clubbing, cyanosis, or edema  MS- no significant deformity or atrophy Skin- warm and dry, no rash or lesion; ICD pocket well healed Psych- euthymic mood, full affect Neuro- strength and sensation are intact  ICD interrogation- reviewed in detail today,  See PACEART report  EKG:  EKG is not ordered today.  Recent Labs: 06/19/2017: Magnesium 1.8 08/08/2017: Hemoglobin 14.2; Platelets 139 09/25/2017: BUN 17; Creatinine, Ser 2.49; Potassium 4.2; Sodium 141   Wt Readings from Last 3 Encounters:  06/17/18 245 lb (111.1 kg)  03/05/18 246 lb 9.6 oz (111.9 kg)  12/16/17 247 lb (112 kg)     Other studies Reviewed: Additional studies/ records that were reviewed today include: Dr Olin Pia office notes   Assessment and Plan:  1.  Chronic systolic dysfunction euvolemic today EF normalized post AVN ablation and CRT Stable on an appropriate medical regimen Normal ICD function See Pace Art report No changes today  2.  Permanent AF with RVR S/p AVN  ablation Continue Eliquis for CHADS2VASC of 4 CBC, BMET followed by renal No bleeding issues  3.  HTN Stable No change required today   Current medicines are reviewed at length with the patient today.   The patient does not have concerns regarding his medicines.  The following changes were made today:  none  Labs/ tests ordered today include: none Orders Placed This Encounter  Procedures  . CUP PACEART INCLINIC DEVICE CHECK     Disposition:   Follow up with [facility], Dr Meda Coffee as scheduled, Dr Caryl Comes 6 months   Signed, Chanetta Marshall, NP 06/17/2018 12:09 PM  [facility] [address] [place] [address] [phone] (office) [phone] (fax)

## 2018-06-17 NOTE — Patient Instructions (Signed)
Medication Instructions:  NONE If you need a refill on your cardiac medications before your next appointment, please call your pharmacy.   Lab work:PLEASE HAVE DOCTOR FAX LABS TO [phone] [facility]) NONE If you have labs (blood work) drawn today and your tests are completely normal, you will receive your results only by: Marland Kitchen MyChart Message (if you have MyChart) OR . A paper copy in the mail If you have any lab test that is abnormal or we need to change your treatment, we will call you to review the results.  Testing/Procedures: NONE  Follow-Up: At [facility], you and your health needs are our priority.  As part of our continuing mission to provide you with exceptional heart care, we have created designated Provider Care Teams.  These Care Teams include your primary Cardiologist (physician) and Advanced Practice Providers (APPs -  Physician Assistants and Nurse Practitioners) who all work together to provide you with the care you need, when you need it. You will need a follow up appointment in 6 months.  Please call our office 2 months in advance to schedule this appointment.  You may see Dr Caryl Comes or one of the following Advanced Practice Providers on your designated Care Team:   Chanetta Marshall, NP . Tommye Standard, PA-C  Any Other Special Instructions Will Be Listed Below (If Applicable). Remote monitoring is used to monitor your  ICD from home. This monitoring reduces the number of office visits required to check your device to one time per year. It allows [place] to keep an eye on the functioning of your device to ensure it is working properly. You are scheduled for a device check from home on 09/16/2018. You may send your transmission at any time that day. If you have a wireless device, the transmission will be sent automatically. After your physician reviews your transmission, you will receive a postcard with your next transmission date.

## 2018-06-17 NOTE — Patient Outreach (Signed)
  [facility]) Care Management Chronic Special Needs Program    06/17/2018  Name: Anthony [name], DOB: 1950-12-05  MRN: [id number]  Dolores returned call to client. Mr. Bucklin wanted to provide additional information about his diet and questioned if he should be drinking a protein drink adding that he does not think he is getting enough protein. RNCM reviewed protein rich foods. Reinforced with client that Glucerna or supplements for people with diabetes is what he would need to consider. RNCM also reinforced he had to be mindful of fluid intake due to history of heart failure. Client states he was on the way into his cardiologist office. RNCM encouraged client to also discuss with his doctor. Client declines additional education information at this time.  Plan: outreach in 3 months as previously planned.  Thea Silversmith, RN, MSN, [place] [phone]

## 2018-06-19 ENCOUNTER — Other Ambulatory Visit: Payer: Self-pay

## 2018-06-19 NOTE — Patient Outreach (Signed)
[facility]) Care Management  [place]   06/19/2018  Anthony [name] 29-May-1950 [id number]  Reason for referral: Medication Assistance with DM medications & Medication Management  Referral source: [facility] CSNP RN Current insurance:[employer]  PMHx includes but not limited to:  HTN, Afib, T2DM, GERD, CKD  Outreach:  Successful telephone call with Ms. Anthony [name].  HIPAA identifiers verified.  Patient states he is doing well today.  His BGs remain at goal.  He and his PCP are in agreement to pursue patient assistance for DM medications.  Medications previously reviewed with patient last week.  No changes at this time.  No adverse events reported.   Objective: Lab Results  Component Value Date   CREATININE 2.49 (H) 09/25/2017   CREATININE 2.38 (H) 09/11/2017   CREATININE 2.50 (H) 09/02/2017    Lab Results  Component Value Date   HGBA1C 6.8 03/05/2018    Lipid Panel     Component Value Date/Time   CHOL 108 12/24/2016 1313   TRIG 89 12/24/2016 1313   HDL 35 (L) 12/24/2016 1313   CHOLHDL 3.1 12/24/2016 1313   CHOLHDL 3.2 02/28/2016 0906   VLDL 19 02/28/2016 0906   LDLCALC 55 12/24/2016 1313   LDLDIRECT 58 11/06/2011 1434    BP Readings from Last 3 Encounters:  06/17/18 (!) 142/68  03/05/18 118/62  12/16/17 140/88    Allergies  Allergen Reactions  . Enalapril Maleate Cough    Medications Reviewed Today    Reviewed by Luretha Rued, RN (Registered Nurse) on 06/11/18 at 1604  Med List Status: <None>  Medication Order Taking? Sig Documenting Provider Last Dose Status Informant  apixaban (ELIQUIS) 5 MG TABS tablet [id number] Yes Take 1 tablet (5 mg total) by mouth 2 (two) times daily. Deboraha Sprang, MD Taking Active Self           Med Note Modena Nunnery, Ernest Mallick Aug 13, 2017 11:43 AM)    B-D ULTRAFINE III SHORT PEN 31G X 8 MM MISC [id number]  1 UNITS BY DOES NOT APPLY ROUTE AS NEEDED. [provider]  Active   calcitRIOL  (ROCALTROL) 0.25 MCG capsule [id number] Yes TAKE 0.25 MG BY MOUTH daily [provider] Taking Active Self  furosemide (LASIX) 40 MG tablet [id number]  Take 1 tablet (40 mg total) by mouth 2 (two) times daily. Deboraha Sprang, MD  Expired 02/12/18 2359   gabapentin (NEURONTIN) 300 MG capsule [id number] Yes TAKE ONE CAPSULE BY MOUTH EVERY NIGHT AT BEDTIME Everrett Coombe, MD Taking Active   hydrALAZINE (APRESOLINE) 25 MG tablet [id number] Yes Take 1 tablet (25 mg total) by mouth 3 (three) times daily. Deboraha Sprang, MD Taking Active   Insulin Isophane & Regular Human (HUMULIN 70/30 MIX) (70-30) 100 UNIT/ML PEN [id number] Yes INJECT 50 UNITS INTO THE SKIN DAILY. Sela Hilding, MD Taking Active   KLOR-CON M20 20 MEQ tablet [id number] Yes TAKE 1 TABLET BY MOUTH TWICE A DAY Lyda Jester M, PA-C Taking Active   labetalol (NORMODYNE) 200 MG tablet [id number] Yes TAKE 2 TABLETS (400 MG TOTAL) BY MOUTH 2 (TWO) TIMES DAILY. Patsey Berthold, NP Taking Active   Multiple Vitamin (MULTIVITAMIN) tablet [id number] Yes Take 1 tablet by mouth daily.   [provider] Taking Active Self  nitroGLYCERIN (NITROSTAT) 0.4 MG SL tablet [id number] Yes Place 0.4 mg under the tongue every 5 (five) minutes as needed for chest pain. If no relief by 3rd tab, call 911  [provider] Taking Active Self           Med Note Ala Dach   Wed Aug 20, 2017 10:08 AM) Never used  Omega 3 1000 MG CAPS [id number] No Take 2,000 mg by mouth daily.  [provider] Not Taking Active Self  omeprazole (PRILOSEC) 40 MG capsule [id number] Yes TAKE ONE CAPSULE BY MOUTH DAILY Sela Hilding, MD Taking Active   oxymorphone (OPANA) 5 MG tablet [id number] Yes Take 5 mg by mouth every 6 (six) hours as needed for pain (max 3 tablets daily). Per Dr Alvin Critchley, MD Taking Active Self  PARoxetine (PAXIL) 40 MG tablet [id number] Yes TAKE ONE TABLET BY MOUTH EVERY MORNING Sela Hilding, MD Taking Active    polyethylene glycol [facility] / GLYCOLAX) packet [id number] Yes Take 17 g by mouth daily. Kathrene Alu, MD Taking Active Self  rosuvastatin (CRESTOR) 5 MG tablet [id number] Yes TAKE 1 TABLET BY MOUTH EVERY DAY Lyda Jester M, PA-C Taking Active   Semaglutide [facility]) 0.25 or 0.5 MG/DOSE SOPN [id number] Yes Inject 0.25 mg into the skin once a week. Sela Hilding, MD Taking Active   Med List Note Wynona Canes, CPhT 08/13/17 1152): CPAP         Medication Assistance Findings:  Patient Assistance Programs: 1) [place] made by [place] requirement met: _0  Yes _1  No _2  Unknown o Out-of-pocket prescription expenditure met:    _3  Yes _4  No  _5  Unknown  <SWFUXNATFTDDUKGU>_5<\/KYHCWCBJSEGBTDVV>_6  Not applicable - Patient has met application requirements to apply for this patient assistance program.          2)  Trulicity made by [place] requirement met: _7  Yes _8  No  _9  Unknown o Out-of-pocket prescription expenditure met:   _10  Yes _11  No   _12  Unknown <HYWVPXTGGYIRSWNI>_6<\/EVOJJKKXFGHWEXHB>_71  Not applicable - Patient has met application requirements to apply for this patient assistance program.      Plan: I will route patient assistance letter to [place] technician who will coordinate patient assistance program application process for medications listed above.  [facility] pharmacy technician will assist with obtaining all required documents from both patient and provider(s) and submit application(s) once completed.   Regina Eck, PharmD, [place]  [phone]

## 2018-06-19 NOTE — Patient Outreach (Signed)
  [facility]) Care Management Chronic Special Needs Program  06/19/2018  Name: Anthony [name] DOB: October 27, 1950  MRN: [id number]  Mr. Markhi Kleckner is enrolled in a chronic special needs plan for Heart Failure. Reviewed and updated care plan.  Subjective: RNCM received call from client regarding insulin cost. He reports he thought his insulin was supposed to be free. Client states he has already informed [facility] Pharmacist and states she is following up on this.   Goals Addressed            This Visit's Progress   . Client will report abillity to obtain Medications within the next 3 months.         Plan:  care coordination with Lottie Dawson, [facility]. Continue to follow as scheduled.   Thea Silversmith, RN, MSN, [place] [phone]  .

## 2018-06-22 ENCOUNTER — Ambulatory Visit: Payer: Self-pay | Admitting: Pharmacist

## 2018-06-22 ENCOUNTER — Other Ambulatory Visit: Payer: Self-pay | Admitting: Pharmacy Technician

## 2018-06-22 ENCOUNTER — Encounter: Payer: Self-pay | Admitting: Cardiology

## 2018-06-22 ENCOUNTER — Telehealth: Payer: Self-pay | Admitting: Family Medicine

## 2018-06-22 NOTE — Telephone Encounter (Signed)
Received [facility] paperwork for [employer]. Filled out with most recent doses - Humalog 75/25 at 50 U daily and trulicity 0.75mg  weekly which would be the approximate conversion from ozempic low dose. Will fax back to [facility].

## 2018-06-22 NOTE — Patient Outreach (Signed)
[facility]) Care Management  06/22/2018  YOHAN SAMONS [birth year] [id number]                                                  Medication Assistance Referral  Referral From: [facility] RPh Jenne Pane.  Medication/Company: Humalog 97/74 and Trulicity / Ralph Ley[employer] Cares Patient application portion:  Education officer, museum portion: Faxed  to Dr. Lindell Noe  Follow up:  Will follow up with patient in 5-7 business days to confirm application(s) have been received.  [name] [place] Certified Pharmacy Technician [place] Management Direct [phone]

## 2018-06-23 ENCOUNTER — Encounter: Payer: Self-pay | Admitting: Family Medicine

## 2018-06-23 ENCOUNTER — Ambulatory Visit (INDEPENDENT_AMBULATORY_CARE_PROVIDER_SITE_OTHER): Payer: BC Managed Care – PPO | Admitting: Family Medicine

## 2018-06-23 ENCOUNTER — Other Ambulatory Visit: Payer: Self-pay

## 2018-06-23 VITALS — BP 145/80 | HR 88 | Temp 98.1°F | Wt 246.0 lb

## 2018-06-23 DIAGNOSIS — E1122 Type 2 diabetes mellitus with diabetic chronic kidney disease: Secondary | ICD-10-CM | POA: Diagnosis not present

## 2018-06-23 DIAGNOSIS — E119 Type 2 diabetes mellitus without complications: Secondary | ICD-10-CM

## 2018-06-23 DIAGNOSIS — I10 Essential (primary) hypertension: Secondary | ICD-10-CM

## 2018-06-23 DIAGNOSIS — Z794 Long term (current) use of insulin: Secondary | ICD-10-CM

## 2018-06-23 DIAGNOSIS — IMO0001 Reserved for inherently not codable concepts without codable children: Secondary | ICD-10-CM

## 2018-06-23 DIAGNOSIS — N183 Chronic kidney disease, stage 3 (moderate): Secondary | ICD-10-CM

## 2018-06-23 LAB — POCT GLYCOSYLATED HEMOGLOBIN (HGB A1C): HbA1c, POC (controlled diabetic range): 8.2 % — AB (ref 0.0–7.0)

## 2018-06-23 MED ORDER — LABETALOL HCL 200 MG PO TABS: 600.0000 mg | ORAL_TABLET | Freq: Two times a day (BID) | ORAL | 11 refills | Status: DC

## 2018-06-23 NOTE — Assessment & Plan Note (Signed)
Likely causing his CKD, suboptimal control today and at the last nephrology visit.  He has been compliant with his medicines today including Lasix 40 twice daily, hydro-25 3 times daily and labetalol 400 twice daily.  We will increase his labetalol to 600 twice daily.  Reviewed this with him several times as he sometimes has trouble remembering medication dose changes.

## 2018-06-23 NOTE — Assessment & Plan Note (Signed)
A1c worsened today to 8.2, likely due to drinking lots of regular sodas of the past couple months.  Patient self identifies his goal is decreasing or abstaining from these.  We will keep his insulin regimen the same at present and plan to recheck in 3 months.  I hope to decrease his 70/30 and possibly increase his Ozempic at next visit if A1c improves.

## 2018-06-23 NOTE — Progress Notes (Signed)
   CC: DM, HTN  HPI  DM - has been drinking 3 regular sodas per day. Still doing novolin and ozempic. [facility] pharmacy is helping with getting meds.   No one has called yet for the colonoscopy. Previously saw Dr. Fuller Plan.  HTN - high last at [place]. Taking lasix in the am and then at noon. Took hydral altready twice.   ROS: Denies CP, SOB, abdominal pain, dysuria, changes in BMs.   CC, SH/smoking status, and VS noted  Objective: BP (!) 145/80   Pulse 88   Temp 98.1 F (36.7 C) (Oral)   Wt 246 lb (111.6 kg)   SpO2 91%   BMI 32.46 kg/m  Gen: NAD, alert, cooperative, and pleasant. HEENT: NCAT, EOMI, PERRL CV: RRR, no murmur Resp: CTAB, no wheezes, non-labored Ext: No edema, warm Neuro: Alert and oriented, Speech clear, No gross deficits  Assessment and plan:  HYPERTENSION, BENIGN SYSTEMIC Likely causing his CKD, suboptimal control today and at the last nephrology visit.  He has been compliant with his medicines today including Lasix 40 twice daily, hydro-25 3 times daily and labetalol 400 twice daily.  We will increase his labetalol to 600 twice daily.  Reviewed this with him several times as he sometimes has trouble remembering medication dose changes.  IDDM (insulin dependent diabetes mellitus) (HCC) A1c worsened today to 8.2, likely due to drinking lots of regular sodas of the past couple months.  Patient self identifies his goal is decreasing or abstaining from these.  We will keep his insulin regimen the same at present and plan to recheck in 3 months.  I hope to decrease his 70/30 and possibly increase his Ozempic at next visit if A1c improves.   Orders Placed This Encounter  Procedures  . HgB A1c    Meds ordered this encounter  Medications  . labetalol (NORMODYNE) 200 MG tablet    Sig: Take 3 tablets (600 mg total) by mouth 2 (two) times daily.    Dispense:  120 tablet    Refill:  11    Ralene Ok, MD, [facility] 06/23/2018 4:38 PM

## 2018-06-23 NOTE — Patient Instructions (Signed)
It was a pleasure to see you today! Thank you for choosing [facility] for your primary care. Anthony [name] was seen for blood pressure, diabetes.   Our plans for today were:  Please decrease the sodas. We will check again in 3 months.   For your blood pressure, increase your labetalol to 3 pills twice per day.    Best,  Dr. Lindell Noe

## 2018-06-24 ENCOUNTER — Ambulatory Visit: Payer: Self-pay | Admitting: Pharmacist

## 2018-06-24 ENCOUNTER — Other Ambulatory Visit: Payer: Self-pay | Admitting: Pharmacist

## 2018-06-24 ENCOUNTER — Other Ambulatory Visit: Payer: Self-pay | Admitting: Cardiology

## 2018-06-26 NOTE — Patient Outreach (Signed)
[name]) Care Management  06/26/2018  Anthony [name] [birth year] [id number]   Care coordination calls to [facility], [facility] and Anthony [name].  HIPAA identifiers verified as warranted.  Patient was being charged $47 for insulin, however it was a Tier 6 insulin under [place] plan and should be $0 copay for a time period.  Insurance company added NDC to formulary for Novolog 70/30 Flex Pen as this is a formulary DM product for 2020 (this NDC was not listed previously).  Patient was [facility]ed [facility] from [facility].  Copay for Novolog 70/30 flex pen is not $0 for a limited time under HTA CSNP.  BG meter and strips obtained for $0 copay as well. [place] in the process of applying for patient assistance for Humalog 59/74 & Trulicity pens.  Message left for patient to reach out if any additional needs or issues.  PLAN: -I will reach out to patient next month  Regina Eck, PharmD, [place]  [phone]

## 2018-06-30 ENCOUNTER — Other Ambulatory Visit: Payer: Self-pay | Admitting: Pharmacy Technician

## 2018-06-30 ENCOUNTER — Ambulatory Visit: Payer: Medicare Other | Admitting: *Deleted

## 2018-06-30 NOTE — Patient Outreach (Signed)
[facility]) Care Management  06/30/2018  BORA BRONER Mar 24, 1951 [id number]    Successful call placed to patient regarding patient assistance application(s) for Humalog and Basaglar , HIPAA identifiers verified. Mr. Macintyre confirms that he received patient assistance applications. He stated that he would fill them out and get them back in as soon as he can.  Will follow up with patient in 14-21 business days if documents have not been received.  [name] [place] Certified Pharmacy Technician [place] Management Direct Dial:[phone]

## 2018-07-01 ENCOUNTER — Encounter: Payer: Self-pay | Admitting: Podiatry

## 2018-07-01 ENCOUNTER — Ambulatory Visit (INDEPENDENT_AMBULATORY_CARE_PROVIDER_SITE_OTHER): Payer: HMO | Admitting: Podiatry

## 2018-07-01 DIAGNOSIS — E119 Type 2 diabetes mellitus without complications: Secondary | ICD-10-CM | POA: Diagnosis not present

## 2018-07-01 DIAGNOSIS — M79609 Pain in unspecified limb: Secondary | ICD-10-CM

## 2018-07-01 DIAGNOSIS — M2010 Hallux valgus (acquired), unspecified foot: Secondary | ICD-10-CM

## 2018-07-01 DIAGNOSIS — B351 Tinea unguium: Secondary | ICD-10-CM

## 2018-07-01 NOTE — Progress Notes (Signed)
Patient ID: Anthony [name], male   DOB: 1951-04-18, 68 y.o.   MRN: [id number] Complaint:  Visit Type: Patient returns to my office for continued preventative foot care services. Complaint: Patient states" my nails have grown long and thick and become painful to walk and wear shoes" Patient has been diagnosed with DM with neuropathy.. The patient presents for preventative foot care services. No changes to ROS.    Podiatric Exam: Vascular: dorsalis pedis and posterior tibial pulses are palpable bilateral. Capillary return is immediate. Temperature gradient is WNL. Skin turgor WNL  Sensorium: Normal Semmes Weinstein monofilament test. Normal tactile sensation bilaterally. Nail Exam: Pt has thick disfigured discolored nails with subungual debris noted bilateral entire nail hallux through fifth toenails Ulcer Exam: There is no evidence of ulcer or pre-ulcerative changes or infection. Orthopedic Exam: Muscle tone and strength are WNL. No limitations in general ROM. No crepitus or effusions noted. HAV  B/L.  Pes planus. Amputation second toe left foot. Skin: No Porokeratosis. No infection or ulcers.  Skin separation at proximal nail fold third toe right foot.  Diagnosis:  Onychomycosis, , Pain in right toe, pain in left toes  Treatment & Plan Procedures and Treatment: Consent by patient was obtained for treatment procedures. The patient understood the discussion of treatment and procedures well. All questions were answered thoroughly reviewed. Debridement of mycotic and hypertrophic toenails, 1 through 5 bilateral and clearing of subungual debris. No ulceration, no infection noted. Betadine/DSD 3rd toe right. Return Visit-Office Procedure: Patient instructed to return to the office for a follow up visit 3 months for continued evaluation and treatment.   Gardiner Barefoot DPM

## 2018-07-03 ENCOUNTER — Other Ambulatory Visit: Payer: Self-pay | Admitting: Cardiology

## 2018-07-06 ENCOUNTER — Other Ambulatory Visit: Payer: Self-pay | Admitting: Pharmacy Technician

## 2018-07-06 NOTE — Patient Outreach (Signed)
[place] [facility]) Care Management  07/06/2018  Anthony [name] 29-Mar-1951 [id number]    Successful call placed to patient regarding patient assistance application(s) for Humalog and Trulicity , HIPAA identifiers verified. [name] states that he still hasn't received the patient assistance application that was mailed out to him. Informed him that I would mail another application out and confirmed his mailing address.  Will follow up with patient in 5-7 business days to confirm application has been received.  [name] [place] Certified Pharmacy Technician [place] Management Direct Dial:[phone]

## 2018-07-13 ENCOUNTER — Telehealth: Payer: Self-pay | Admitting: Internal Medicine

## 2018-07-13 ENCOUNTER — Ambulatory Visit: Payer: Self-pay | Admitting: Pharmacist

## 2018-07-13 ENCOUNTER — Ambulatory Visit (INDEPENDENT_AMBULATORY_CARE_PROVIDER_SITE_OTHER): Payer: HMO

## 2018-07-13 ENCOUNTER — Telehealth: Payer: Self-pay | Admitting: Cardiology

## 2018-07-13 DIAGNOSIS — I428 Other cardiomyopathies: Secondary | ICD-10-CM

## 2018-07-13 NOTE — Telephone Encounter (Signed)
Patient called and stated that he is experiencing some shocking sensation around his device site. He also reports some shortness of breath, fatigue (he stated that he tires easily. No chest pain, no dizziness. Informed pt that a Device Tech RN will review his remote transmission and call him back after lunchtime. Pt verbalized pt.

## 2018-07-13 NOTE — Telephone Encounter (Signed)
I will close this phone note out as pt called back and has been speaking with [facility]. See 07/13/18 phone note from [facility], [place].

## 2018-07-13 NOTE — Telephone Encounter (Signed)
Spoke with pt informed him that I hard reviewed his transmission and that his device working correctly, he has not had any episodes and that his fluid level was not up. Informed pt that the sensations he was feeling around his device could be nerve related. Pt reported feeling ok today in his usual state of health, pt stated that he was out working in the yard the yesterday informed pt that this could have some relation to his SOB yesterday. Pt stated that he would see how the rest of this week goes and call back if he has any more problems.

## 2018-07-13 NOTE — Telephone Encounter (Signed)
I called back the pt to find out about why he was calling. I asked for him to please call back to discuss further as that we may try to have him speak with the person her needs to, (ie) does he need to s/w a triage nurse or the nurse for the cardiologist or was he calling in for a refill etc. Need more information.

## 2018-07-14 LAB — CUP PACEART REMOTE DEVICE CHECK
Battery Remaining Longevity: 47 mo
Battery Voltage: 2.96 V
Brady Statistic AP VP Percent: 0 %
Brady Statistic AP VS Percent: 0 %
Brady Statistic AS VP Percent: 0 %
Brady Statistic AS VS Percent: 0 %
Brady Statistic RA Percent Paced: 0 %
Brady Statistic RV Percent Paced: 99.54 %
Date Time Interrogation Session: 20200217151555
HighPow Impedance: 70 Ohm
Implantable Lead Implant Date: 20190301
Implantable Lead Implant Date: 20190301
Implantable Lead Location: 753858
Implantable Lead Location: 753860
Implantable Pulse Generator Implant Date: 20190301
Lead Channel Impedance Value: 1064 Ohm
Lead Channel Impedance Value: 1121 Ohm
Lead Channel Impedance Value: 1121 Ohm
Lead Channel Impedance Value: 1140 Ohm
Lead Channel Impedance Value: 1140 Ohm
Lead Channel Impedance Value: 1140 Ohm
Lead Channel Impedance Value: 323 Ohm
Lead Channel Impedance Value: 323 Ohm
Lead Channel Impedance Value: 323 Ohm
Lead Channel Impedance Value: 323 Ohm
Lead Channel Impedance Value: 323 Ohm
Lead Channel Impedance Value: 4047 Ohm
Lead Channel Impedance Value: 418 Ohm
Lead Channel Impedance Value: 551 Ohm
Lead Channel Impedance Value: 646 Ohm
Lead Channel Impedance Value: 646 Ohm
Lead Channel Impedance Value: 646 Ohm
Lead Channel Impedance Value: 646 Ohm
Lead Channel Pacing Threshold Amplitude: 0.375 V
Lead Channel Pacing Threshold Amplitude: 2.75 V
Lead Channel Pacing Threshold Pulse Width: 0.4 ms
Lead Channel Pacing Threshold Pulse Width: 1.5 ms
Lead Channel Sensing Intrinsic Amplitude: 13 mV
Lead Channel Sensing Intrinsic Amplitude: 13 mV
Lead Channel Setting Pacing Amplitude: 2.5 V
Lead Channel Setting Pacing Amplitude: 3.25 V
Lead Channel Setting Pacing Pulse Width: 0.4 ms
Lead Channel Setting Pacing Pulse Width: 1.5 ms
Lead Channel Setting Sensing Sensitivity: 0.3 mV

## 2018-07-15 ENCOUNTER — Other Ambulatory Visit: Payer: Self-pay | Admitting: Pharmacy Technician

## 2018-07-15 ENCOUNTER — Ambulatory Visit: Payer: Self-pay | Admitting: Pharmacist

## 2018-07-15 NOTE — Patient Outreach (Signed)
[place] [facility]) Care Management  07/15/2018  REILEY KEISLER 02-Sep-1950 [id number]    Incoming call from patient stating he received the [facility] patient assistance application in the mail. He stated he will fill it out and mail it back as soon as he can.  Will follow up with patient in 14-21 business days if documents have not been received.  [name] [place] Certified Pharmacy Technician [place] Management Direct Dial:[phone]

## 2018-07-21 ENCOUNTER — Other Ambulatory Visit: Payer: Self-pay | Admitting: Pharmacist

## 2018-07-21 ENCOUNTER — Ambulatory Visit: Payer: Self-pay | Admitting: Pharmacist

## 2018-07-22 NOTE — Progress Notes (Signed)
Remote ICD transmission.   

## 2018-07-23 ENCOUNTER — Other Ambulatory Visit: Payer: Self-pay | Admitting: Pharmacy Technician

## 2018-07-23 ENCOUNTER — Encounter: Payer: Self-pay | Admitting: Pharmacy Technician

## 2018-07-23 NOTE — Patient Outreach (Signed)
[name] [name]) Care Management  07/23/2018  Anthony [name] 03/22/1951 [id number]   Incoming call from patient with questions in regards to filling out patient assistance application for Humalog and Trulicity. Assisted patient with filling out application.  Patient brought his portion of application and document into [facility] office.  Faxed completed application and required documents into [employer].  Will follow up with company in 7-10 business days.  [name] [name] Certified Pharmacy Technician [name] Management Direct Dial:[phone]

## 2018-07-27 NOTE — Patient Outreach (Signed)
[place] [facility]) Care Management  07/21/2018  Anthony [name] July 26, 1950 [id number]   Successful routine outreach to Anthony [name] with HIPAA identifiers verified.  Patient is HTA-CSNP patient and [place] is following him for DM management and PAPs for medications.  Patient states he is doing well today.  His FBG was 159 and he states 100% compliance with ALL medications.  He denies current or recent illnesses.  Reinforced DM healthy diet. Patient verbalizes understanding and appreciative of call.  Patient states he does not understand how to fill out PAP applications for [employer].  Called [facility] CPhT, Etter Sjogren to assist in meeting with patient to complete applications.  Appreciate assistance.  PLAN: -Patient appears stable and capable of managing his disease state.  I will follow up with him quarterly from this point on.  Regina Eck, PharmD, [place]  [phone]

## 2018-07-29 ENCOUNTER — Telehealth: Payer: Self-pay | Admitting: Family Medicine

## 2018-07-29 NOTE — Telephone Encounter (Signed)
RN team, could you please call patient and have him come in to get his medicines? They are in the lab fridge.

## 2018-07-29 NOTE — Telephone Encounter (Signed)
Pt contacted and informed his medications are ready for pick up.

## 2018-07-30 ENCOUNTER — Other Ambulatory Visit: Payer: Self-pay | Admitting: Pharmacy Technician

## 2018-07-30 NOTE — Patient Outreach (Addendum)
[place] [facility]) Care Management  07/30/2018  Anthony [name] 23-Apr-1951 [id number]    Follow up call placed to [facility] regarding patient assistance application(s) for Humalog and Trulicity , Lovena Le confirms patient has been approved as of 2/28 until 05/27/19. Medication should arrive at providers office in 3-5 business days.  Follow up:  Will follow up with patient in 5-7 business days to confirm medication has been received.  [name] [place] Certified Pharmacy Technician [place] Management Direct Dial:[phone]

## 2018-07-31 ENCOUNTER — Encounter: Payer: Self-pay | Admitting: Pharmacy Technician

## 2018-07-31 ENCOUNTER — Other Ambulatory Visit: Payer: Self-pay | Admitting: Pharmacy Technician

## 2018-07-31 NOTE — Patient Outreach (Signed)
[place] [facility]) Care Management  07/31/2018  Anthony [name] 04-13-1951 [id number]    Incoming call from patient regarding patient assistance medication receipt from [facility], HIPAA identifiers verified. Patient confirms that he picked up his Humalog and Trulicity from providers office. Reviewed with patient how to obtain refills.  Patient had question about authorization for Colonoscopy. Informed him that I am not able to see information about that. Suggested that he contact his primary provider for information.  Patient also questioned Eliquis patient assistance. Informed him that I will mail  Follow up:  Will follow up with patient in 7-10 business days to confirm application has been received.  [name] [place] Certified Pharmacy Technician [place] Management Direct Dial:[phone]

## 2018-08-03 ENCOUNTER — Telehealth: Payer: Self-pay

## 2018-08-03 NOTE — Telephone Encounter (Signed)
We received Pt Asst application (providers portion) for Eliquis from [employer] at [facility]. I have filled out our part and place in Dr Aquilla Hacker box for signature.

## 2018-08-07 NOTE — Telephone Encounter (Signed)
Dr Caryl Comes has signed the provider part BMS pt assistance application and I have faxed it to [place] at [facility].

## 2018-08-14 ENCOUNTER — Telehealth: Payer: Self-pay | Admitting: Family Medicine

## 2018-08-14 NOTE — Telephone Encounter (Signed)
Pt called and wanted to schedule his f/u for his sugars.  I was not able to get a hold of Dr. Lindell Noe. I informed the pt that the doctors are mainly doing office visits over the phone and that Dr. Lindell Noe will call him to discuss if they can have this visit over the phone or if she wants to schedule him to come in to the clinic.   Please call pt back at the number listed in his chart.

## 2018-08-14 NOTE — Telephone Encounter (Signed)
Called patient, he wanted to come in to check on his sugars (not having any lows or highs) for his regular DM checkup. I offered for him to come in later as we are trying to prevent exposure to possible COVID in the community. He is willing to wait and call back at the end of April. He appreciates the call and will call us back in about a month to schedule. I asked him to stay home as much as possible, he will do so. He will call if any new sxs or concerns.

## 2018-08-17 ENCOUNTER — Other Ambulatory Visit: Payer: Self-pay | Admitting: Pharmacy Technician

## 2018-08-17 NOTE — Patient Outreach (Signed)
[place] [facility]) Care Management  08/17/2018  TRAVARIS KOSH April 28, 1951 [id number]    Unsuccessful call #1 placed to patient regarding patient assistance application(s) for Eliquis , HIPAA compliant voicemail left.   Follow up:  Will make 2nd call attempt in 3-5 business days if call has not been returned.  [name] [place] Certified Pharmacy Technician [place] Management Direct Dial:[phone]

## 2018-08-18 ENCOUNTER — Telehealth: Payer: Self-pay | Admitting: *Deleted

## 2018-08-18 NOTE — Telephone Encounter (Signed)
Pt wants to know how much trulicity to take.  He would like to start this week. According to notes it looks like he is to take 0.75mg  / week but I would like to verify with MD since this medications is not on his med list.  Will forward to PCP. Christen Bame, CMA

## 2018-08-18 NOTE — Telephone Encounter (Signed)
Pt contacted and informed. Pt verbalized understanding and was appreciative of the call.

## 2018-08-18 NOTE — Telephone Encounter (Signed)
Yes, 0.75mg /week is correct, thank you.

## 2018-08-19 ENCOUNTER — Encounter: Payer: Self-pay | Admitting: Pharmacy Technician

## 2018-08-27 ENCOUNTER — Other Ambulatory Visit: Payer: Self-pay | Admitting: Student in an Organized Health Care Education/Training Program

## 2018-08-27 ENCOUNTER — Other Ambulatory Visit: Payer: Self-pay | Admitting: Nurse Practitioner

## 2018-08-28 ENCOUNTER — Other Ambulatory Visit: Payer: Self-pay | Admitting: Internal Medicine

## 2018-08-28 MED ORDER — HYDRALAZINE HCL 25 MG PO TABS: 25.0000 mg | ORAL_TABLET | Freq: Three times a day (TID) | ORAL | 8 refills | Status: DC

## 2018-08-31 ENCOUNTER — Other Ambulatory Visit: Payer: Self-pay

## 2018-08-31 NOTE — Patient Outreach (Signed)
  [place] [facility]) Care Management Chronic Special Needs Program  08/31/2018  Name: JERL MUNYAN DOB: May 27, 1951  MRN: [id number]  Mr. Naveen Clardy is enrolled in a chronic special needs plan for Heart Failure. Reviewed and updated care plan. Discussed COVID19 cause, symptoms, precautions (social distancing, stay at home order, hand washing), confirmed client knows how to contact provider.  Subjective: Client reports he listens to the news, he reports he will be making himself a face covering if he has to go out. Client voiced the best thing for him to do to decease his chances of getting the virus is to stay at home. Client acknowledges that he is working with [place] for medication assistance.  Goals Addressed            This Visit's Progress   . Client understands the importance of follow-up with providers by attending scheduled visits   On track   . Client verbalize knowledge of Heart Failure disease self management skills within the next 6 months.      . Client will report abillity to obtain Medications within the next 3 months.   On track   . Client will use Assistive Devices as needed and verbalize understanding of device use   On track   . Client will verbalize knowledge of self management of Hypertension as evidences by BP reading of 140/90 or less; or as defined by provider   On track   . Maintain timely refills of diabetic medication as prescribed within the year .   On track   . Maintain timely refills of Heart Failure medication as prescribed within the year    On track   . Obtain annual  Lipid Profile, LDL-C   On track   . Obtain Annual Eye (retinal)  Exam    On track   . Obtain Annual Foot Exam   On track   . Obtain annual screen for micro albuminuria (urine) , nephropathy (kidney problems)   On track   . Obtain Hemoglobin A1C at least 2 times per year   On track   . Visit Primary Care Provider or Cardiologist at least 2 times per year   On track   . Visit Primary Care Provider or Endocrinologist at least 2 times per year    On track      Plan: send copy of updated care plan. Chronic care management coordinator will outreach in: 3 months.   Thea Silversmith, RN, MSN, [place] [phone]   .

## 2018-09-10 ENCOUNTER — Ambulatory Visit: Payer: BC Managed Care – PPO

## 2018-09-10 ENCOUNTER — Other Ambulatory Visit: Payer: Self-pay

## 2018-09-10 MED ORDER — ONETOUCH ULTRASOFT LANCETS MISC: 12 refills | Status: DC

## 2018-09-10 NOTE — Telephone Encounter (Signed)
Pt calls nurse line requesting lancets to be sent to his pharmacy to use with his one touch meter.   This was not on his med list, so I just added one.. not sure if correct.

## 2018-09-15 DIAGNOSIS — N184 Chronic kidney disease, stage 4 (severe): Secondary | ICD-10-CM | POA: Diagnosis not present

## 2018-09-15 DIAGNOSIS — E1122 Type 2 diabetes mellitus with diabetic chronic kidney disease: Secondary | ICD-10-CM | POA: Diagnosis not present

## 2018-09-15 DIAGNOSIS — I129 Hypertensive chronic kidney disease with stage 1 through stage 4 chronic kidney disease, or unspecified chronic kidney disease: Secondary | ICD-10-CM | POA: Diagnosis not present

## 2018-09-15 DIAGNOSIS — N2581 Secondary hyperparathyroidism of renal origin: Secondary | ICD-10-CM | POA: Diagnosis not present

## 2018-09-16 ENCOUNTER — Other Ambulatory Visit: Payer: Self-pay | Admitting: Nurse Practitioner

## 2018-09-16 NOTE — Telephone Encounter (Signed)
Per pt call the pharmacy needs this medication authorized and would like a call back to do so.  LABETALOL HCL 221m

## 2018-09-23 ENCOUNTER — Other Ambulatory Visit: Payer: Self-pay | Admitting: Pharmacist

## 2018-09-23 ENCOUNTER — Ambulatory Visit: Payer: Self-pay | Admitting: Pharmacist

## 2018-09-26 ENCOUNTER — Other Ambulatory Visit: Payer: Self-pay | Admitting: Family Medicine

## 2018-09-29 NOTE — Patient Outreach (Addendum)
[place] [facility]) Care Management [place]  09/29/2018  Anthony [name] Sep 22, 1950 [id number]  Reason for referral: HTA C-SNP, medication management/assistance  Successful routine outreach to [name] with HIPAA identifiers verified.  Patient is HTA-CSNP patient and [place] is following him for DM management and PAPs for medications.  Patient states he is doing well today.  He states his FBG have ben <150 and reports 100% compliance with ALL medications.  His most recent A1c was 8.1 on 09/03/18 (down form 8.6).  Encouraged patient to continue his progress.  He denies current or recent illnesses.  Reinforced DM healthy diet. Patient verbalizes understanding and appreciative of call.  Reminded patient he is $110 out of pocket spend away from applying for Eliquis.  Confirmed with [facility] CPhT, Etter Sjogren who is assisting with process. Appreciate assistance.  PLAN: -Patient appears stable and capable of managing his disease state.  I will continue to follow up with him quarterly. -Next A1c due in July  Regina Eck, PharmD, [place]  [phone]

## 2018-09-30 ENCOUNTER — Encounter: Payer: Self-pay | Admitting: Podiatry

## 2018-09-30 ENCOUNTER — Other Ambulatory Visit: Payer: Self-pay

## 2018-09-30 ENCOUNTER — Ambulatory Visit (INDEPENDENT_AMBULATORY_CARE_PROVIDER_SITE_OTHER): Payer: HMO | Admitting: Podiatry

## 2018-09-30 VITALS — Temp 97.5°F

## 2018-09-30 DIAGNOSIS — B351 Tinea unguium: Secondary | ICD-10-CM

## 2018-09-30 DIAGNOSIS — M2010 Hallux valgus (acquired), unspecified foot: Secondary | ICD-10-CM

## 2018-09-30 DIAGNOSIS — E119 Type 2 diabetes mellitus without complications: Secondary | ICD-10-CM

## 2018-09-30 DIAGNOSIS — M79609 Pain in unspecified limb: Secondary | ICD-10-CM

## 2018-09-30 DIAGNOSIS — D689 Coagulation defect, unspecified: Secondary | ICD-10-CM

## 2018-09-30 NOTE — Progress Notes (Signed)
Patient ID: Anthony [name], male   DOB: [birth year], 68 y.o.   MRN: [id number] Complaint:  Visit Type: Patient returns to my office for continued preventative foot care services. Complaint: Patient states" my nails have grown long and thick and become painful to walk and wear shoes" Patient has been diagnosed with DM with neuropathy.. The patient presents for preventative foot care services. No changes to ROS.  Patient is taking eliquiss.  Podiatric Exam: Vascular: dorsalis pedis and posterior tibial pulses are palpable bilateral. Capillary return is immediate. Temperature gradient is WNL. Skin turgor WNL  Sensorium: Normal Semmes Weinstein monofilament test. Normal tactile sensation bilaterally. Nail Exam: Pt has thick disfigured discolored nails with subungual debris noted bilateral entire nail hallux through fifth toenails Ulcer Exam: There is no evidence of ulcer or pre-ulcerative changes or infection. Orthopedic Exam: Muscle tone and strength are WNL. No limitations in general ROM. No crepitus or effusions noted. HAV  B/L.  Pes planus. Amputation second toe left foot. Skin: No Porokeratosis. No infection or ulcers.    Diagnosis:  Onychomycosis, , Pain in right toe, pain in left toes  Treatment & Plan Procedures and Treatment: Consent by patient was obtained for treatment procedures. The patient understood the discussion of treatment and procedures well. All questions were answered thoroughly reviewed. Debridement of mycotic and hypertrophic toenails, 1 through 5 bilateral and clearing of subungual debris. No ulceration, no infection noted.   Return Visit-Office Procedure: Patient instructed to return to the office for a follow up visit 3 months for continued evaluation and treatment.   Gardiner Barefoot DPM

## 2018-10-01 IMAGING — CT CT HEAD W/O CM
4 series · 16 of 47 positions shown, 18 images · non-contrast
Comparison: None.

CLINICAL DATA: Seizure

EXAM:
CT HEAD WITHOUT CONTRAST
TECHNIQUE: Contiguous axial images were obtained from the base of the skull
through the vertex without intravenous contrast.

[Series 3: head without · axial · non-contrast · 0.45mm/px · z∈[+1348,+1468]mm · 7 of 34 slices shown, 9 images]
[im 5/34  brain]
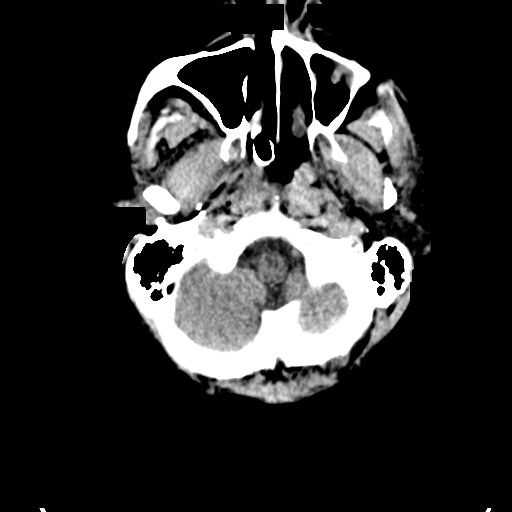
[im 5/34  bone]
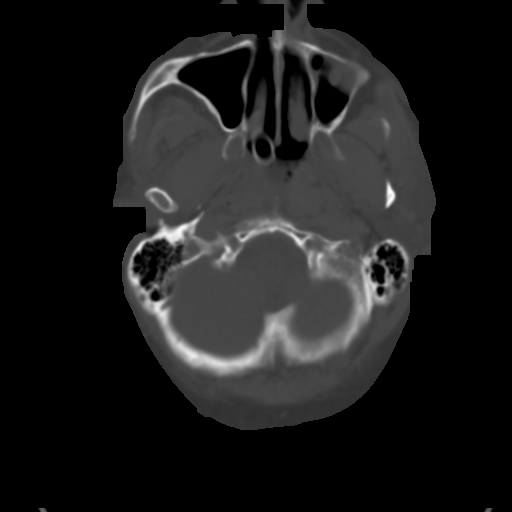
[im 9/34  brain]
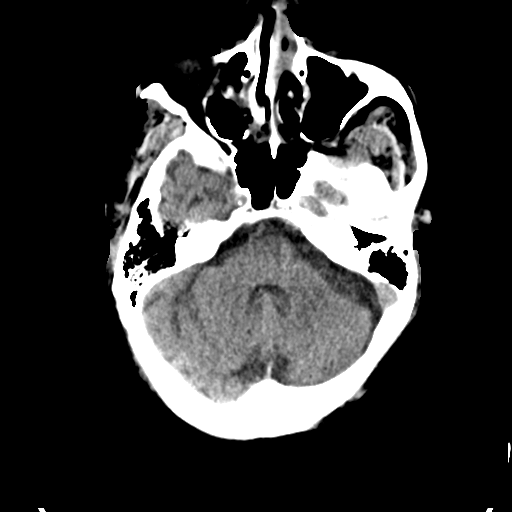
[im 13/34  brain]
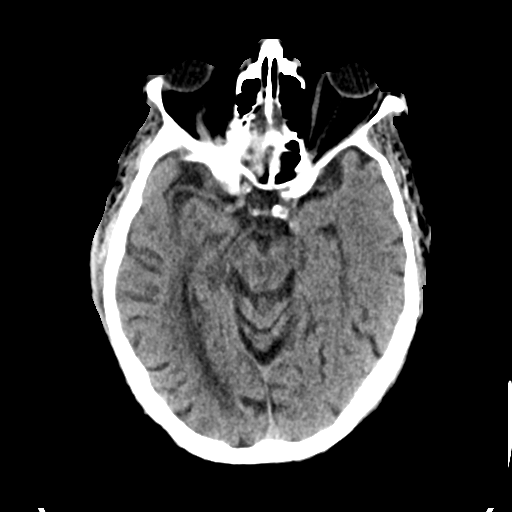
[im 17/34  brain]
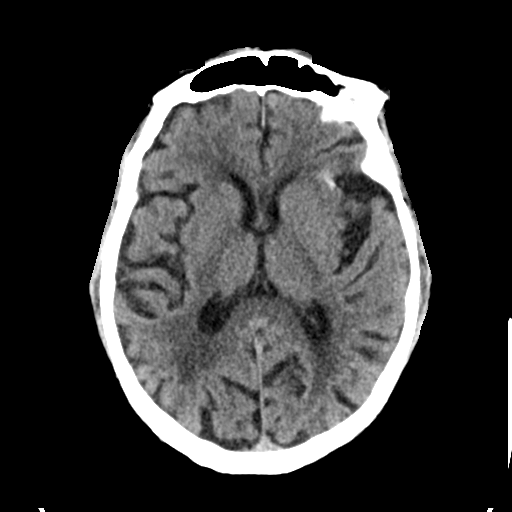
[im 21/34  brain]
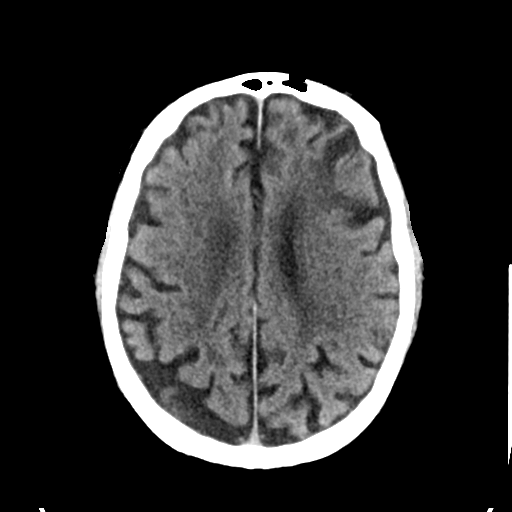
[im 21/34  bone]
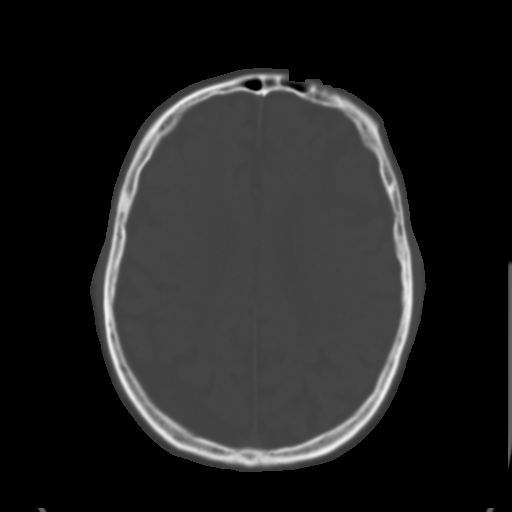
[im 25/34  brain]
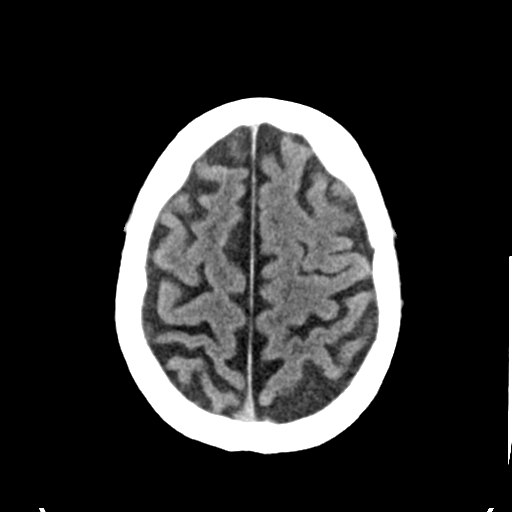
[im 29/34  brain]
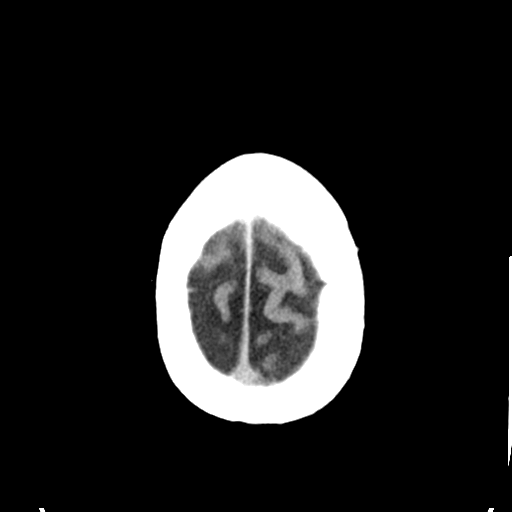

[Series 4: head bone · axial · 0.45mm/px · z∈[+1344,+1378]mm · 3 of 85 slices shown]
[im 9/85  bone]
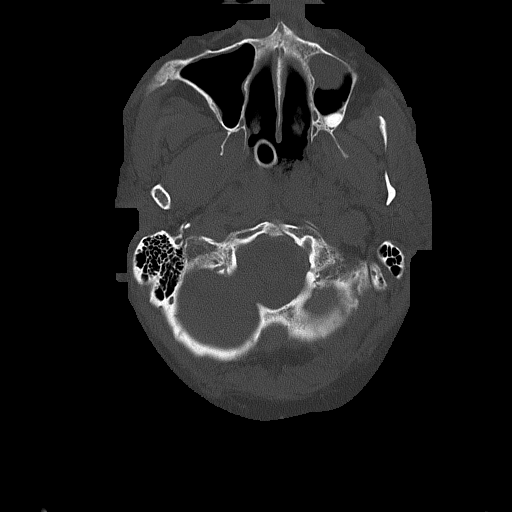
[im 17/85  bone]
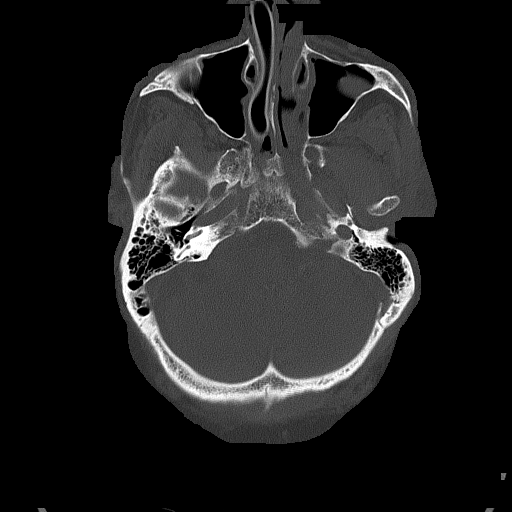
[im 26/85  bone]
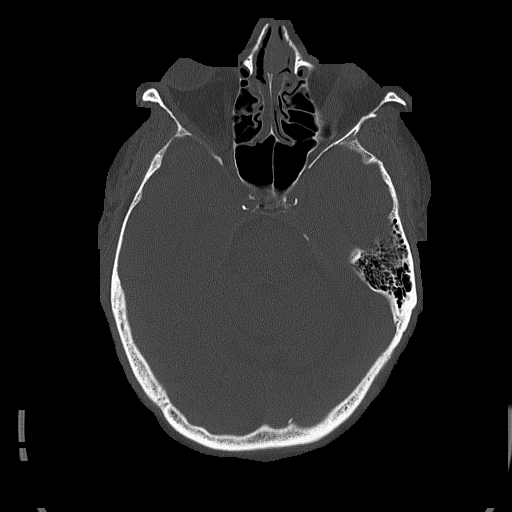

[Series 5: head without cor · coronal · non-contrast · 0.34mm/px · 3 of 73 slices shown]
[im 25/73  brain]
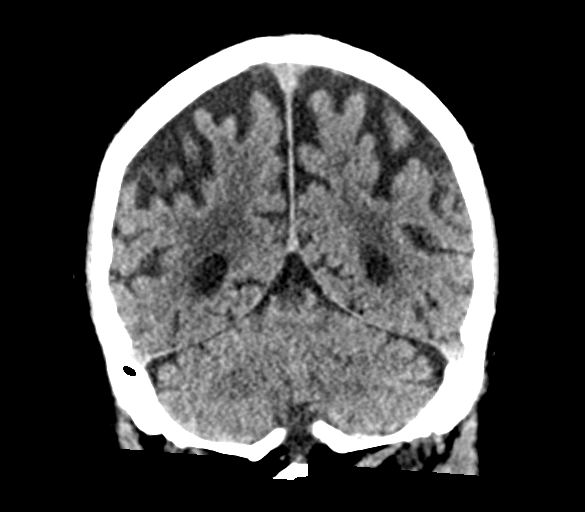
[im 33/73  brain]
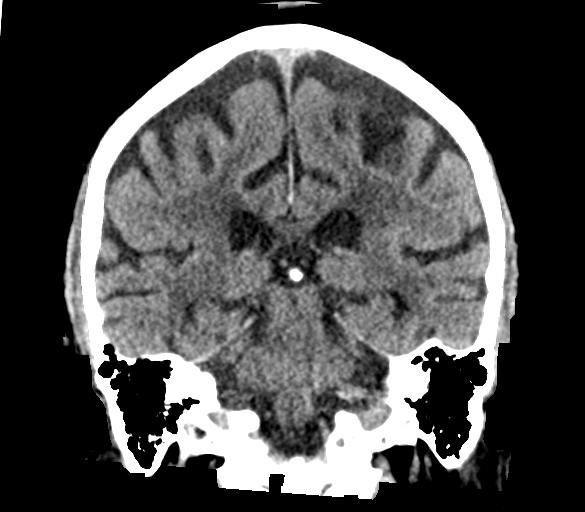
[im 41/73  brain]
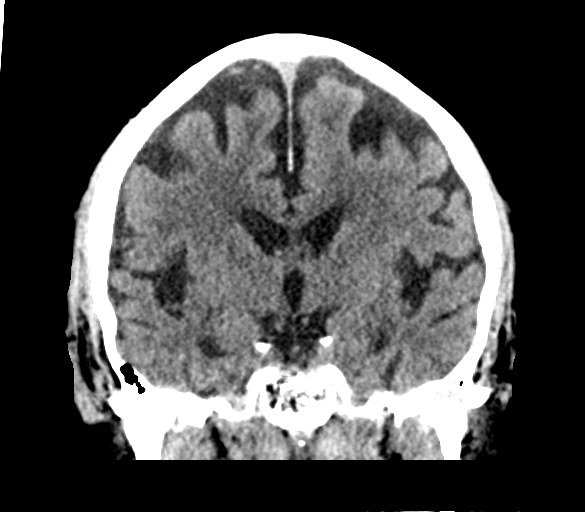

[Series 6: head without sag · sagittal · non-contrast · 0.34mm/px · 3 of 65 slices shown]
[im 23/65  brain]
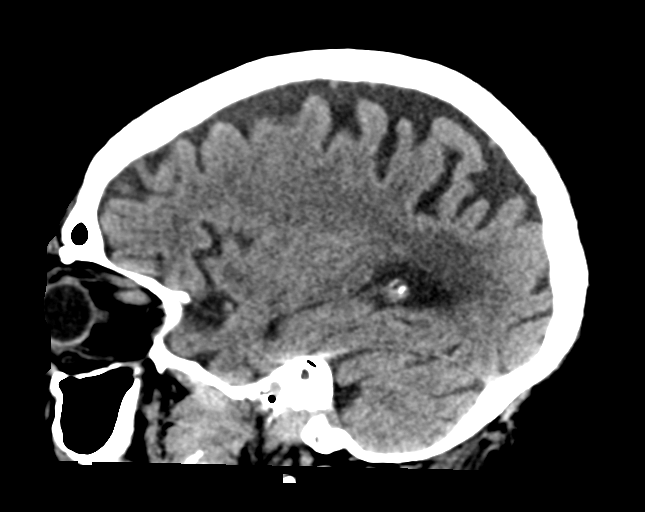
[im 33/65  brain]
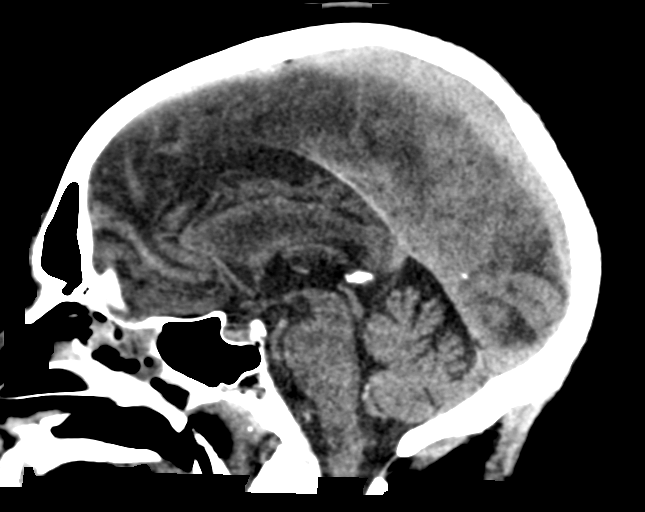
[im 42/65  brain]
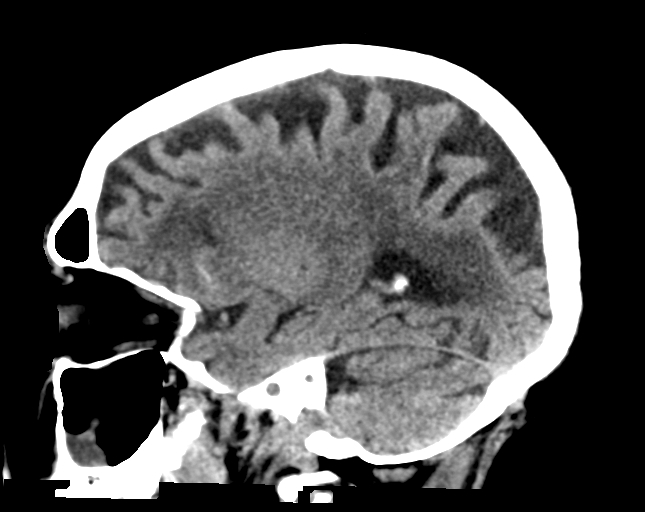

[16 of 47 positions shown; findings below may reference images not displayed]

FINDINGS: Brain: Moderate atrophy. Moderate to marked small vessel ischemic
changes of the white matter. Encephalomalacia in the left frontal
lobe consistent with old infarct. Gyriform areas of increased
density at the left insula and inferior frontal lobe. Ventricles are
nonenlarged. No mass lesion.

Vascular: No hyperdense vessels.  Carotid artery calcification.

Skull: No fracture.

Sinuses/Orbits: Mucosal thickening and retention cysts in the
maxillary sinuses, mucosal thickening in the ethmoid sinuses. No
acute orbital abnormality.

Other: None
IMPRESSION: 1. Gyriform areas of increased density along the left insula and
inferior left frontal lobe, favored to represent cortical
calcifications as opposed to hemorrhage, this could be related to
sequela of prior necrosis or infection. Evidence of old left frontal
lobe infarct.
2. Atrophy and fairly extensive small vessel ischemic changes of the
white matter.
3. Sinus disease.

## 2018-10-12 ENCOUNTER — Ambulatory Visit (INDEPENDENT_AMBULATORY_CARE_PROVIDER_SITE_OTHER): Payer: HMO | Admitting: Family Medicine

## 2018-10-12 ENCOUNTER — Encounter: Payer: Self-pay | Admitting: Family Medicine

## 2018-10-12 ENCOUNTER — Encounter: Payer: HMO | Admitting: *Deleted

## 2018-10-12 ENCOUNTER — Other Ambulatory Visit: Payer: Self-pay

## 2018-10-12 VITALS — Ht 73.0 in | Wt 235.0 lb

## 2018-10-12 DIAGNOSIS — Z0000 Encounter for general adult medical examination without abnormal findings: Secondary | ICD-10-CM | POA: Diagnosis not present

## 2018-10-12 NOTE — Patient Instructions (Addendum)
Continue to work on weaning down your smoking. Call 1800-QUIT-NOW for help with stopping smoking. They can assist with free resources such as patches, check-in calls, and counseling.   Keep up the good work on controlling your blood sugar. We will check your a1c when you come in next time.   Call [facility] to talk about when they might be doing colonoscopies again.   Bring in your healthcare power of attorney form next time you come.   Think about whether you would want to get the lung picture to see if there are any spots on your lung that look worrisome. This is not urgent.

## 2018-10-12 NOTE — Progress Notes (Signed)
Subjective:   Anthony [name] is a 68 y.o. male who presents for Medicare Annual/Subsequent preventive examination.  The patient consented to a virtual visit.   Review of Systems:  Denies CP, SOB, abdominal pain, dysuria, changes in BMs.   Body mass index is 31 kg/m.        Objective:    Vitals: Ht '6\' 1"'  (1.854 m)   Wt 235 lb (106.6 kg)   BMI 31.00 kg/m   Body mass index is 31 kg/m.  Advanced Directives 06/23/2018 06/11/2018 02/18/2018 12/01/2017 11/19/2017 09/02/2017 08/20/2017  Does Patient Have a Medical Advance Directive? Yes Yes No No No - No  Type of Advance Directive Healthcare Power of [place]  Does patient want to make changes to medical advance directive? No - Patient declined - - - - - -  Copy of [place] in Chart? No - copy requested No - copy requested - - - - -  Would patient like information on creating a medical advance directive? No - Patient declined - Yes (MAU/Ambulatory/Procedural Areas - Information given) No - Patient declined No - Patient declined No - Patient declined No - Patient declined    Tobacco Social History   Tobacco Use  Smoking Status Light Tobacco Smoker  . Packs/day: 0.25  . Years: 45.00  . Pack years: 11.25  . Types: Cigarettes  . Start date: 05/28/1967  Smokeless Tobacco Never Used     Ready to quit: No Counseling given: Yes   Clinical Intake:     Pain : No/denies pain     BMI - recorded: 31 Nutritional Status: BMI > 30  Obese Nutritional Risks: None Diabetes: Yes CBG done?: Yes(187 this am) CBG resulted in Enter/ Edit results?: No Did pt. bring in CBG monitor from home?: No  How often do you need to have someone help you when you read instructions, pamphlets, or other written materials from your doctor or pharmacy?: 1 - Never What is the last grade level you completed in school?: GED, 10th   Interpreter Needed?: No     Past Medical History:  Diagnosis Date   . Arthritis   . Blood transfusion without reported diagnosis   . CAD in native artery    a. reported MI 2000, 2001, s/p stenting of the circumflex lesion extending into the second obtuse marginal branch with a drug-eluting stent in 2003. b. low risk nuc 2015.  Marland Kitchen Chronic atrial fibrillation   . Chronic combined systolic and diastolic CHF (congestive heart failure) ([name])    a. Previously diastolic, then EF 70-01% in 10/2016.  . CKD (chronic kidney disease), stage III ([place])   . Depression   . Diabetes mellitus   . Edema of extremities   . Erectile dysfunction   . GERD (gastroesophageal reflux disease)   . Hyperlipidemia   . Hypertension   . Myocardial infarction ([place]) 2000&2001  . Obesity   . Onychomycosis   . OSA (obstructive sleep apnea)   . S/P ICD (internal cardiac defibrillator) procedure and BiV device,  07/25/17 Medtronic  07/26/2017  . Seizures ([place])   . Tubular adenoma of colon 10/2002   Past Surgical History:  Procedure Laterality Date  . ANGIOPLASTY  2001   stent x 1  . AV NODE ABLATION N/A 08/20/2017   Procedure: AV NODE ABLATION;  Surgeon: Deboraha Sprang, MD;  Location: [facility];  Service: Cardiovascular;  Laterality: N/A;  . BIV  ICD INSERTION CRT-D N/A 07/25/2017   Procedure: BIV ICD INSERTION CRT-D;  Surgeon: Deboraha Sprang, MD;  Location: [facility];  Service: Cardiovascular;  Laterality: N/A;  . COLONOSCOPY  2000?   negative  . FOOT ARTHROTOMY Right   . TOE AMPUTATION Left 2012   Family History  Problem Relation Age of Onset  . Heart attack Mother   . Heart attack Father   . Colon cancer Neg Hx   . Stomach cancer Neg Hx    Social History   Socioeconomic History  . Marital status: Married    Spouse name: [name]  . Number of children: 2  . Years of education: GED  . Highest education level: Not on file  Occupational History  . Occupation: CUSTOMER SERVICES    Employer: [employer]    Comment: [employer]  Social Needs  . Financial resource strain:  Not on file  . Food insecurity:    Worry: Not on file    Inability: Not on file  . Transportation needs:    Medical: Not on file    Non-medical: Not on file  Tobacco Use  . Smoking status: Light Tobacco Smoker    Packs/day: 0.25    Years: 45.00    Pack years: 11.25    Types: Cigarettes    Start date: 05/28/1967  . Smokeless tobacco: Never Used  Substance and Sexual Activity  . Alcohol use: No    Comment: quit in 1993  . Drug use: No  . Sexual activity: Not on file  Lifestyle  . Physical activity:    Days per week: Not on file    Minutes per session: Not on file  . Stress: Not on file  Relationships  . Social connections:    Talks on phone: Not on file    Gets together: Not on file    Attends religious service: Not on file    Active member of club or organization: Not on file    Attends meetings of clubs or organizations: Not on file    Relationship status: Not on file  Other Topics Concern  . Not on file  Social History Narrative   Patient is right handed, consumes caffeine rarely. Patient resides in home with wife.    Outpatient Encounter Medications as of 10/12/2018  Medication Sig  . apixaban (ELIQUIS) 5 MG TABS tablet Take 1 tablet (5 mg total) by mouth 2 (two) times daily.  . B-D ULTRAFINE III SHORT PEN 31G X 8 MM MISC 1 UNITS BY DOES NOT APPLY ROUTE AS NEEDED.  Marland Kitchen Blood Glucose Monitoring Suppl (ONETOUCH VERIO) w/Device KIT Use kit to check blood sugars.  . calcitRIOL (ROCALTROL) 0.5 MCG capsule Take by mouth daily.  . Dulaglutide (TRULICITY) 6.72 CN/4.7SJ SOPN Inject 0.75 mg into the skin once a week.  . furosemide (LASIX) 40 MG tablet   . gabapentin (NEURONTIN) 300 MG capsule TAKE ONE CAPSULE BY MOUTH EVERY NIGHT AT BEDTIME  . glucose blood (ONETOUCH VERIO) test strip Use as instructed  . hydrALAZINE (APRESOLINE) 25 MG tablet Take 1 tablet (25 mg total) by mouth 3 (three) times daily.  . Insulin Isophane & Regular Human (NOVOLIN 70/30 FLEXPEN) (70-30) 100 UNIT/ML  PEN Inject 50 Units into the skin daily.  Marland Kitchen KLOR-CON M20 20 MEQ tablet TAKE 1 TABLET BY MOUTH TWICE A DAY  . labetalol (NORMODYNE) 200 MG tablet TAKE 2 TABLETS (400 MG TOTAL) BY MOUTH 2 (TWO) TIMES DAILY.  Marland Kitchen Lancets (ONETOUCH ULTRASOFT) lancets Use as instructed  .  Multiple Vitamin (MULTIVITAMIN) tablet Take 1 tablet by mouth daily.    . nitroGLYCERIN (NITROSTAT) 0.4 MG SL tablet Place 0.4 mg under the tongue every 5 (five) minutes as needed for chest pain. If no relief by 3rd tab, call 911   . omeprazole (PRILOSEC) 40 MG capsule TAKE ONE CAPSULE BY MOUTH DAILY  . oxymorphone (OPANA) 5 MG tablet Take 5 mg by mouth every 6 (six) hours as needed for pain (max 3 tablets daily). Per Dr Brien Few  . PARoxetine (PAXIL) 40 MG tablet TAKE ONE TABLET BY MOUTH EVERY MORNING  . polyethylene glycol (MIRALAX / GLYCOLAX) packet Take 17 g by mouth daily.  . rosuvastatin (CRESTOR) 5 MG tablet TAKE 1 TABLET BY MOUTH EVERY DAY   No facility-administered encounter medications on file as of 10/12/2018.     Activities of Daily Living In your present state of health, do you have any difficulty performing the following activities: 06/11/2018 04/15/2018  Hearing? N N  Vision? N N  Difficulty concentrating or making decisions? N N  Walking or climbing stairs? N N  Dressing or bathing? N N  Doing errands, shopping? N N  Preparing Food and eating ? N N  Using the Toilet? N N  In the past six months, have you accidently leaked urine? N N  Do you have problems with loss of bowel control? N N  Managing your Medications? N N  Managing your Finances? N N  Housekeeping or managing your Housekeeping? N N  Some recent data might be hidden    Patient Care Team: Sela Hilding, MD as PCP - General (Family Medicine) Dorothy Spark, MD as PCP - Cardiology (Cardiology) Luretha Rued, RN as [place] Management [place], Royce Macadamia, [facility] as [place] Management (Pharmacist)  Adaline Sill, CPhT as [place] (Pharmacy Technician)   Assessment:   This is a routine wellness examination for Aiyden.  Exercise Activities and Dietary recommendations    Goals    . Client understands the importance of follow-up with providers by attending scheduled visits    . Client verbalize knowledge of Heart Failure disease self management skills within the next 6 months.    . Client will report abillity to obtain Medications within the next 3 months.    . Client will use Assistive Devices as needed and verbalize understanding of device use    . Client will verbalize knowledge of self management of Hypertension as evidences by BP reading of 140/90 or less; or as defined by provider    . HEMOGLOBIN A1C < 7.0    . Maintain timely refills of diabetic medication as prescribed within the year .    . Maintain timely refills of Heart Failure medication as prescribed within the year     . Obtain annual  Lipid Profile, LDL-C    . Obtain Annual Eye (retinal)  Exam     . Obtain Annual Foot Exam    . Obtain annual screen for micro albuminuria (urine) , nephropathy (kidney problems)    . Obtain Hemoglobin A1C at least 2 times per year    . Visit Primary Care Provider or Cardiologist at least 2 times per year    . Visit Primary Care Provider or Endocrinologist at least 2 times per year        Fall Risk Fall Risk  06/23/2018 04/15/2018 03/05/2018 02/18/2018 12/01/2017  Falls in the past year? 0 1 No Yes No  Number falls  in past yr: - 1 - 1 -  Injury with Fall? - 1 - Yes -  Risk for fall due to : - History of fall(s) - - -  Risk for fall due to: Comment - - - - -  Follow up - Falls evaluation completed - Falls evaluation completed -  Comment - - - - -   Is the patient's home free of loose throw rugs in walkways, pet beds, electrical cords, etc?   no      Grab bars in the bathroom? no      Handrails on the stairs?   yes      Adequate lighting?   yes   Timed Get Up and Go Performed: none Patient rating of health (0-10): 7   Depression Screen PHQ 2/9 Scores 06/23/2018 06/11/2018 04/15/2018 03/05/2018  PHQ - 2 Score 0 0 0 0  PHQ- 9 Score - - - -  Exception Documentation - - - -  Not completed - - - -    Cognitive Function        Immunization History  Administered Date(s) Administered  . Influenza Split 02/28/2011, 02/26/2012  . Influenza Whole 03/09/2007, 02/25/2008, 03/30/2009, 06/11/2010  . Influenza,inj,Quad PF,6+ Mos 03/08/2013, 02/23/2014, 02/15/2015, 04/05/2016  . Influenza-Unspecified 03/24/2017  . Pneumococcal Conjugate-13 10/26/2013  . Pneumococcal Polysaccharide-23 03/27/2004, 07/02/2012, 03/05/2018  . Td 11/24/2001  . Tdap 10/26/2013    Qualifies for Shingles Vaccine? yes  Screening Tests Health Maintenance  Topic Date Due  . COLONOSCOPY  06/29/2001  . OPHTHALMOLOGY EXAM  09/25/2018  . HEMOGLOBIN A1C  12/22/2018  . INFLUENZA VACCINE  12/26/2018  . FOOT EXAM  03/06/2019  . COLON CANCER SCREENING ANNUAL FOBT  03/13/2019  . TETANUS/TDAP  10/27/2023  . Hepatitis C Screening  Completed  . PNA vac Low Risk Adult  Completed   Cancer Screenings: Lung: Low Dose CT Chest recommended if Age 12-80 years, 30 pack-year currently smoking OR have quit w/in 15years. Patient does qualify. Colorectal: due for [facility]   Additional Screenings:  Hepatitis C Screening:  done 2018 negative    Plan:     DM - he is anxious to come back and check A1c, CBGs. He wishes he could "rid himself" of this. Explained that this is not likely, but we can continue to work on CBG control. Hasn't been to the eye doctor yet this year, he will have them fax report when he does.   HM - he will think about low dose CT for lung cancer screening, he will call [facility] to check on scheduling his colonoscopy.    Smoking - this is his primary goal. He wants to "wean down" like he did with alcohol many years ago.   AD - His son is his 11  and he could bring it next time.  I have personally reviewed and noted the following in the patient's chart:   . Medical and social history . Use of alcohol, tobacco or illicit drugs  . Current medications and supplements . Functional ability and status . Nutritional status . Physical activity . Advanced directives . List of other physicians . Hospitalizations, surgeries, and ER visits in previous 12 months . Vitals . Screenings to include cognitive, depression, and falls . Referrals and appointments  In addition, I have reviewed and discussed with patient certain preventive protocols, quality metrics, and best practice recommendations. A written personalized care plan for preventive services as well as general preventive health recommendations were provided to patient.  This visit was conducted virtually in the setting of the [place] pandemic.    Ralene Ok, MD  10/12/2018

## 2018-10-13 ENCOUNTER — Telehealth: Payer: Self-pay

## 2018-10-13 NOTE — Telephone Encounter (Signed)
Left message for patient to remind of missed remote transmission.  

## 2018-10-14 ENCOUNTER — Other Ambulatory Visit: Payer: Self-pay | Admitting: *Deleted

## 2018-10-22 ENCOUNTER — Encounter: Payer: Self-pay | Admitting: Cardiology

## 2018-10-26 ENCOUNTER — Other Ambulatory Visit: Payer: Self-pay | Admitting: Pharmacy Technician

## 2018-10-26 NOTE — Patient Outreach (Signed)
[facility]) Care Management  10/26/2018  Anthony [name] 01-Dec-1950 [id number]   Faxed completed application for Eliquis and required documents into [employer].  Will follow up with BMS in 5-7 business days to check application status.  [name] [place] Certified Pharmacy Technician [place] Management Direct Dial:[phone]

## 2018-10-29 ENCOUNTER — Other Ambulatory Visit: Payer: Self-pay | Admitting: Internal Medicine

## 2018-10-29 MED ORDER — APIXABAN 5 MG PO TABS: 5.0000 mg | ORAL_TABLET | Freq: Two times a day (BID) | ORAL | 1 refills | Status: DC

## 2018-10-29 NOTE — Telephone Encounter (Signed)
° ° ° °*  STAT* If patient is at the pharmacy, call can be transferred to refill team.   1. Which medications need to be refilled? (please list name of each medication and dose if known) apixaban (ELIQUIS) 5 MG TABS tablet  2. Which pharmacy/location (including street and city if local pharmacy) is medication to be sent to? [id number] - [place], [place] - [address]  3. Do they need a 30 day or 90 day supply? 90  

## 2018-10-29 NOTE — Telephone Encounter (Signed)
Last OV 06/17/2018 Scr 3.09 (09/15/2018) Age 68 Weight 106kg eliquis 5 BID sent to pharmacy

## 2018-11-02 ENCOUNTER — Other Ambulatory Visit: Payer: Self-pay | Admitting: Pharmacy Technician

## 2018-11-02 NOTE — Patient Outreach (Signed)
[facility]) Care Management  11/02/2018  Anthony [name] 03/04/1951 [id number]    Follow up call placed to [employer] regarding patient assistance application(s) for Eliquis , Anthony [name] states that patient needs to spend an additional 139.96 on medications in order for application to be approved.  Follow up:  Patient aware that he has not spent the required amount to be approved. Will follow up with [employer] in 2-3 weeks for updated spending report and will submit to [place] if requirement has been met.  [name] [place] Certified Pharmacy Technician [place] Management Direct [phone]

## 2018-11-10 ENCOUNTER — Telehealth: Payer: Self-pay | Admitting: Internal Medicine

## 2018-11-10 NOTE — Telephone Encounter (Signed)
Spoke w/ pt and instructed him that his letter was suppose to say that he missed his May remote transmission was missed not August. Instructed pt to send a remote transmission w/ his home monitor today. Instructed him how to do this. Transmission received

## 2018-11-10 NOTE — Telephone Encounter (Signed)
New message:   Patient calling stating he received a letter in the concering a transmission. Please call patient back.

## 2018-11-11 ENCOUNTER — Other Ambulatory Visit: Payer: Self-pay | Admitting: Pharmacy Technician

## 2018-11-11 ENCOUNTER — Ambulatory Visit (INDEPENDENT_AMBULATORY_CARE_PROVIDER_SITE_OTHER): Payer: HMO | Admitting: *Deleted

## 2018-11-11 DIAGNOSIS — I428 Other cardiomyopathies: Secondary | ICD-10-CM | POA: Diagnosis not present

## 2018-11-11 LAB — CUP PACEART REMOTE DEVICE CHECK
Battery Remaining Longevity: 28 mo
Battery Voltage: 2.94 V
Brady Statistic AP VP Percent: 0 %
Brady Statistic AP VS Percent: 0 %
Brady Statistic AS VP Percent: 0 %
Brady Statistic AS VS Percent: 0 %
Brady Statistic RA Percent Paced: 0 %
Brady Statistic RV Percent Paced: 97.34 %
Date Time Interrogation Session: 20200616210923
HighPow Impedance: 73 Ohm
Implantable Lead Implant Date: 20190301
Implantable Lead Implant Date: 20190301
Implantable Lead Location: 753858
Implantable Lead Location: 753860
Implantable Pulse Generator Implant Date: 20190301
Lead Channel Impedance Value: 1178 Ohm
Lead Channel Impedance Value: 1178 Ohm
Lead Channel Impedance Value: 1197 Ohm
Lead Channel Impedance Value: 1235 Ohm
Lead Channel Impedance Value: 1254 Ohm
Lead Channel Impedance Value: 1292 Ohm
Lead Channel Impedance Value: 332.5 Ohm
Lead Channel Impedance Value: 332.5 Ohm
Lead Channel Impedance Value: 332.5 Ohm
Lead Channel Impedance Value: 354.667
Lead Channel Impedance Value: 354.667
Lead Channel Impedance Value: 4047 Ohm
Lead Channel Impedance Value: 418 Ohm
Lead Channel Impedance Value: 513 Ohm
Lead Channel Impedance Value: 665 Ohm
Lead Channel Impedance Value: 665 Ohm
Lead Channel Impedance Value: 665 Ohm
Lead Channel Impedance Value: 760 Ohm
Lead Channel Pacing Threshold Amplitude: 0.5 V
Lead Channel Pacing Threshold Amplitude: 2.75 V
Lead Channel Pacing Threshold Pulse Width: 0.4 ms
Lead Channel Pacing Threshold Pulse Width: 1.5 ms
Lead Channel Sensing Intrinsic Amplitude: 13.25 mV
Lead Channel Sensing Intrinsic Amplitude: 13.25 mV
Lead Channel Setting Pacing Amplitude: 2.5 V
Lead Channel Setting Pacing Amplitude: 4.25 V
Lead Channel Setting Pacing Pulse Width: 0.4 ms
Lead Channel Setting Pacing Pulse Width: 1.5 ms
Lead Channel Setting Sensing Sensitivity: 0.3 mV

## 2018-11-11 NOTE — Patient Outreach (Signed)
[facility] [facility]) Care Management  11/11/2018  NESTOR WIENEKE Mar 03, 1951 [id number]   In basket message received from [place] stating that patient had contacted her regarding a letter that he received in the mail from [employer].  Successful call placed to patient, HIPAA identifiers verified. Informed Mr. Anthis that I had submitted the Eliquis application to [facility]. Explained to him that he would need to spend the additional amount that was included in the letter in order for the company to reprocess it. Requested that patient contact me when he has reached the required OOP spending of 229.36. he stated he would.  [name] [place] Certified Pharmacy Technician [place] Management Direct Dial:[phone]

## 2018-11-23 ENCOUNTER — Other Ambulatory Visit: Payer: Self-pay | Admitting: Family Medicine

## 2018-11-25 ENCOUNTER — Encounter: Payer: Self-pay | Admitting: Cardiology

## 2018-11-25 NOTE — Progress Notes (Signed)
Remote ICD transmission.   

## 2018-11-25 DEATH — deceased

## 2018-11-26 IMAGING — CR DG CHEST 2V
2 series · 2 of 2 positions shown · non-contrast
Comparison: Portable chest x-ray of 06/13/2016 and 06/12/2017

CLINICAL DATA: Abnormal ICD lead measurements

EXAM:
CHEST - 2 VIEW

[w chest pa]
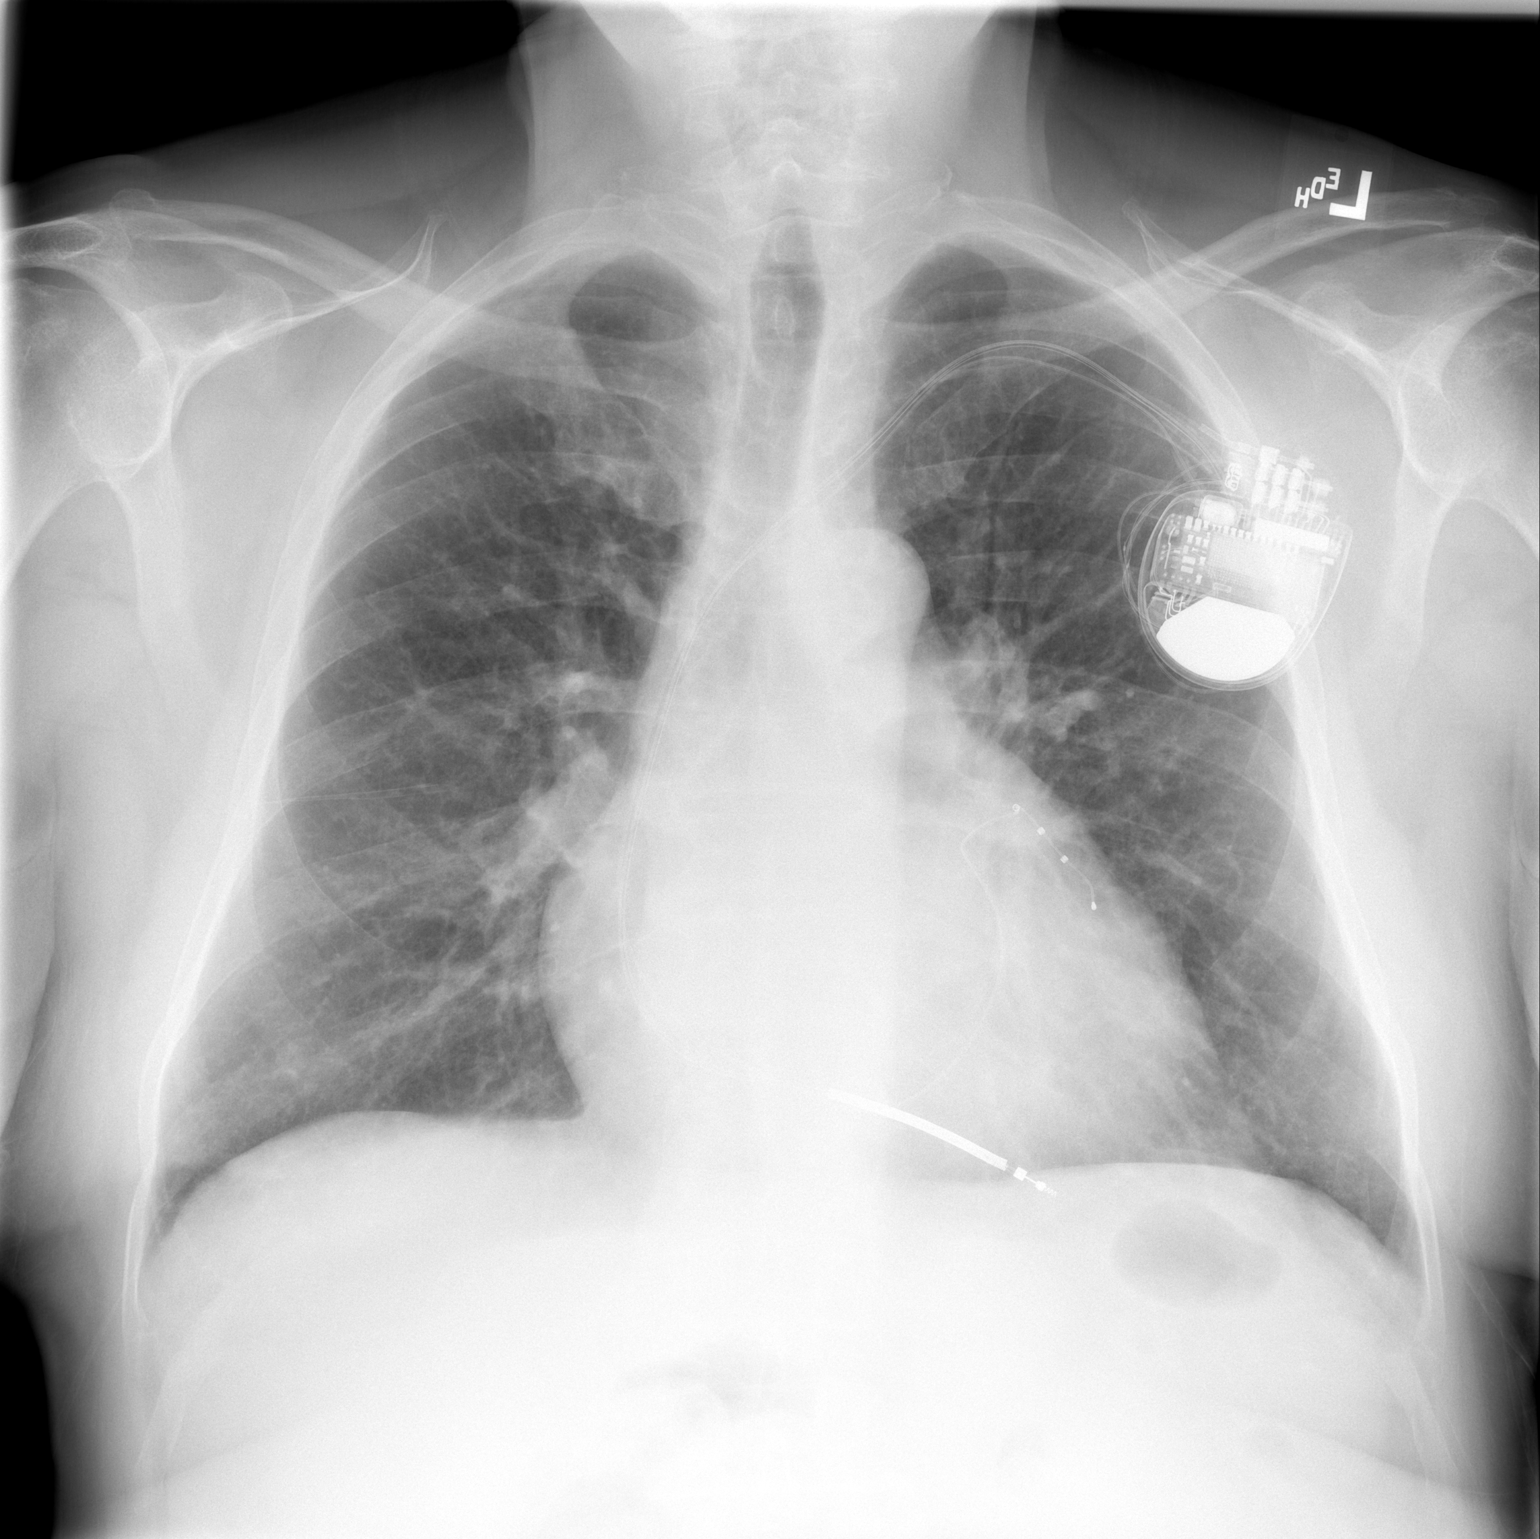

[w chest lat]
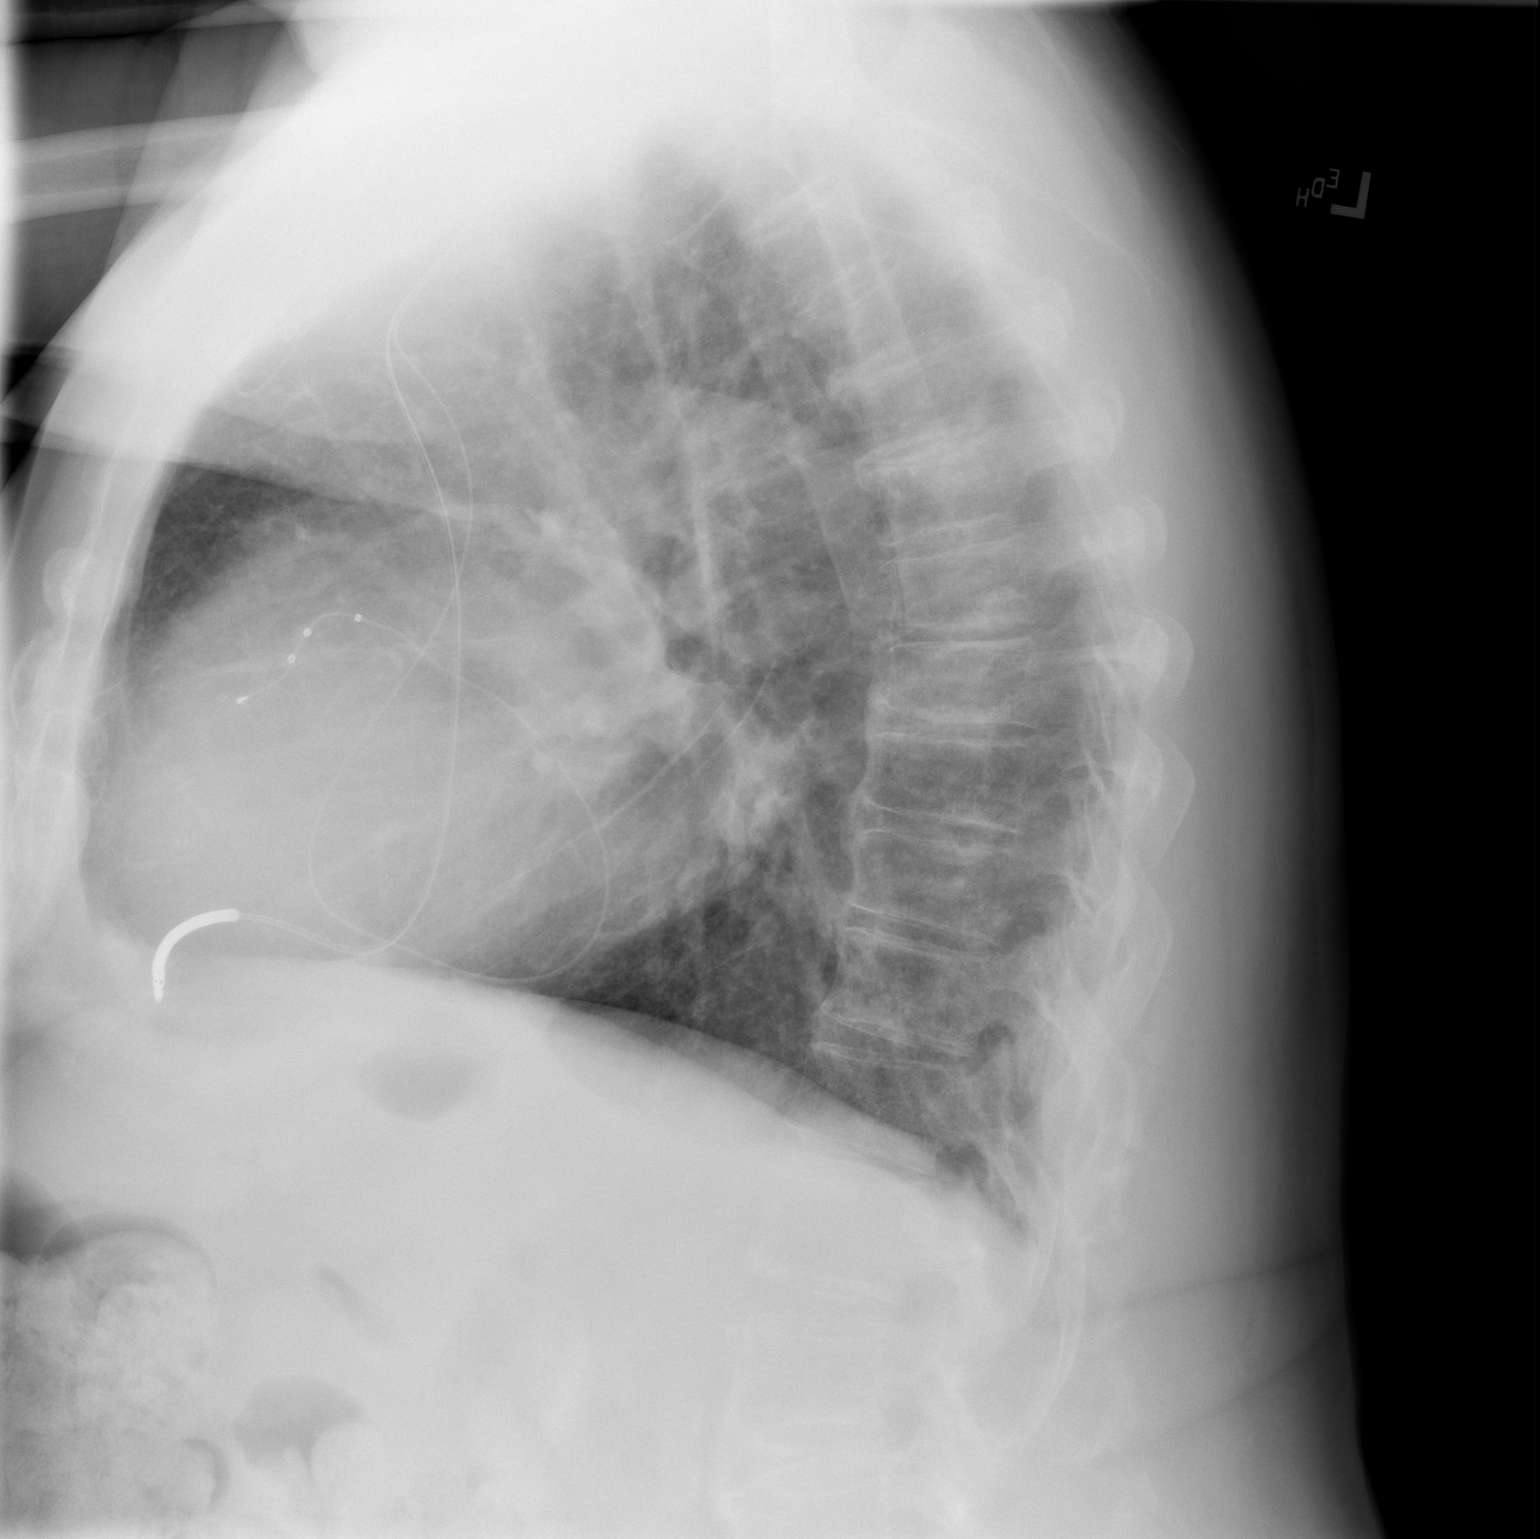

[2 of 2 positions shown; findings below may reference images not displayed]

FINDINGS: No active infiltrate or effusion is seen. Mediastinal and hilar
contours are unremarkable. Cardiomegaly is stable. AICD lead remains
with no significant change in position. No bony abnormality is seen.
IMPRESSION: 1. No active cardiopulmonary disease.
2. Stable cardiomegaly with AICD lead.

## 2018-11-30 ENCOUNTER — Ambulatory Visit: Payer: Self-pay | Admitting: Pharmacist

## 2018-12-02 ENCOUNTER — Other Ambulatory Visit: Payer: Self-pay

## 2018-12-02 NOTE — Patient Outreach (Signed)
  [facility]) Care Management Chronic Special Needs Program  12/02/2018  Name: Anthony [name] DOB: 1950-06-06  MRN: [id number]  [name] is enrolled in a chronic special needs plan for Heart Failure. Reviewed and updated care plan.  Subjective: client reports he last saw primary care in April 2020. He denies any difficulty obtaining medications. He states he has been weighing self, but has not been recording weights. He reports that he will pay more attention to his weight readings. He continues to work in smoking cessation and states he is down to about 5 cigarettes/day. Client reports he cannot afford a blood pressure cuff.   Goals Addressed            This Visit's Progress   . Client understands the importance of follow-up with providers by attending scheduled visits   On track   . Client verbalize knowledge of Heart Failure disease self management skills within the next 6 months.   On track    Reviewed:  Importance of the Action plan Salt in the diet Taking medications as prescribed Fluid intake Following up with provider as scheduled    . COMPLETED: Client will report abillity to obtain Medications within the next 3 months.       Denies any issues with obtaining medications.    . Client will use Assistive Devices as needed and verbalize understanding of device use   On track    Denies any difficulty with use of glucometer. Does not have a blood pressure cuff.    . Client will verbalize knowledge of self management of Hypertension as evidences by BP reading of 140/90 or less; or as defined by provider   On track    Please review educational material regarding taking blood pressure.    . COMPLETED: Maintain timely refills of diabetic medication as prescribed within the year .      . Maintain timely refills of Heart Failure medication as prescribed within the year    On track   . Obtain annual  Lipid Profile, LDL-C   On track   . Obtain Annual Eye  (retinal)  Exam    On track   . Obtain Annual Foot Exam   On track   . Obtain annual screen for micro albuminuria (urine) , nephropathy (kidney problems)   On track   . COMPLETED: Obtain Hemoglobin A1C at least 2 times per year   On track    Completed 06/23/2018  Per client completed 08/2018    . Quit Smoking (pt-stated)   On track    He is wanting to quit completely. He is weaning down. Compares this to when he quit alcohol in 1993.  Discuss with provider smoking cessation options. He reports he is down to about 5 cigarettes/day.    . Visit Primary Care Provider or Cardiologist at least 2 times per year   On track    Primary care provider visit 06/23/2018 Primary care provider visit 08/2018      COVID-19 precautions discussed; encouraged client to call the health care concierge regarding OTC benefit card; encouraged client to call 24 hour nurse advice line as needed. RNCM encouraged client to call as needed-confirmed he has RNCM's contact number.  Plan: RNCM will call in 5 months. Send educational material.send updated care plan to client. Assist client with obtaining blood pressure cuff.  Thea Silversmith, RN, MSN, [place] [phone]

## 2018-12-08 ENCOUNTER — Ambulatory Visit: Payer: Self-pay | Admitting: Pharmacist

## 2018-12-15 ENCOUNTER — Other Ambulatory Visit: Payer: Self-pay | Admitting: Cardiology

## 2018-12-16 ENCOUNTER — Ambulatory Visit: Payer: Self-pay | Admitting: Pharmacist

## 2018-12-17 ENCOUNTER — Ambulatory Visit: Payer: Self-pay | Admitting: Pharmacist

## 2018-12-18 ENCOUNTER — Other Ambulatory Visit: Payer: Self-pay | Admitting: Pharmacist

## 2018-12-18 NOTE — Patient Outreach (Signed)
[facility] [facility]) Care Management  [place]  12/18/2018  Anthony [name] 03/29/1951 [id number]   Reason for referral: Medication Management  Referral source: [employer] C-SNP Care Manager with [facility] Current insurance: [employer] C-SNP  Reason for call: medication management/HTA CSNP  Outreach:  Unsuccessful telephone call attempt  to patient.   HIPAA compliant voicemail left requesting a return call.  Callback information given.  Plan:  -I will make another outreach attempt to patient in the next 2-3 weeks.     Regina Eck, PharmD, BCPS Clinical Pharmacist, [facility] [place]: [phone]

## 2018-12-22 ENCOUNTER — Other Ambulatory Visit: Payer: Self-pay | Admitting: Pharmacist

## 2018-12-23 ENCOUNTER — Other Ambulatory Visit: Payer: Self-pay

## 2018-12-23 ENCOUNTER — Ambulatory Visit (INDEPENDENT_AMBULATORY_CARE_PROVIDER_SITE_OTHER): Payer: HMO | Admitting: Family Medicine

## 2018-12-23 ENCOUNTER — Encounter: Payer: Self-pay | Admitting: Family Medicine

## 2018-12-23 VITALS — BP 138/86 | HR 97 | Wt 247.6 lb

## 2018-12-23 DIAGNOSIS — N183 Chronic kidney disease, stage 3 (moderate): Secondary | ICD-10-CM

## 2018-12-23 DIAGNOSIS — D696 Thrombocytopenia, unspecified: Secondary | ICD-10-CM | POA: Diagnosis not present

## 2018-12-23 DIAGNOSIS — E7800 Pure hypercholesterolemia, unspecified: Secondary | ICD-10-CM

## 2018-12-23 DIAGNOSIS — IMO0001 Reserved for inherently not codable concepts without codable children: Secondary | ICD-10-CM

## 2018-12-23 DIAGNOSIS — Z794 Long term (current) use of insulin: Secondary | ICD-10-CM | POA: Diagnosis not present

## 2018-12-23 DIAGNOSIS — R195 Other fecal abnormalities: Secondary | ICD-10-CM | POA: Diagnosis not present

## 2018-12-23 DIAGNOSIS — E119 Type 2 diabetes mellitus without complications: Secondary | ICD-10-CM

## 2018-12-23 DIAGNOSIS — E1122 Type 2 diabetes mellitus with diabetic chronic kidney disease: Secondary | ICD-10-CM | POA: Diagnosis not present

## 2018-12-23 LAB — POCT GLYCOSYLATED HEMOGLOBIN (HGB A1C): HbA1c, POC (controlled diabetic range): 8 % — AB (ref 0.0–7.0)

## 2018-12-23 NOTE — Patient Instructions (Signed)
Dear Anthony [name],   It was good to see you! Thank you for taking your time to come in to be seen. Today, we discussed the following:   High blood pressure  Your blood pressures have been under 140/90 which is our goal.  Continue to take your blood pressure medications and try to take your blood pressure at least 1 time per week.  This is very important to keep track of because high blood pressures can increase your risk of stroke and further kidney damage.  Diabetes  Continue your diabetic regimen at this time.  Your A1c is 8.0 today and looks fantastic.  You are at goal.  We also spoke about the colonoscopy, which I will send another referral for.  Please follow-up in 6 months or sooner for concerning symptoms.   Be well,   Zettie Cooley, M.D   [facility] [phone]  *Sign up for MyChart for instant access to your health profile, labs, orders, upcoming appointments or to contact your provider with questions*  =================================================================================== Some important items for Anthony [name]'s health:    Your Blood Pressure.  Should be LESS THAN 140/90 or 150/90 if you are over 65 BP Readings from Last 3 Encounters:  12/23/18 138/86  06/23/18 (!) 145/80  06/17/18 (!) 142/68    Your Weight History Wt Readings from Last 3 Encounters:  12/23/18 247 lb 9.6 oz (112.3 kg)  10/12/18 235 lb (106.6 kg)  06/23/18 246 lb (111.6 kg)    Body mass index is 32.67 kg/m.  BMI Classes Classification BMI Category (kg/m2)  Underweight < 18.5  Normal Weight 18.5-24.9  Overweight  25.0-29.9  Obese Class I 30.0-34.9  Obese Class II 35.0-39.9  Obese Class III  > or  = 40.0      Your last A1C     Every 3-6 months if you have diabetes Lab Results  Component Value Date   HGBA1C 8.0 (A) 12/23/2018    Your last Cholesterol   Every 1-5 years    Component Value Date/Time   CHOL 108 12/24/2016 1313   HDL 35 (L) 12/24/2016 1313   LDLCALC 55 12/24/2016 1313   LDLDIRECT 58 11/06/2011 1434    Your last Blood Tests -  Once a year if you take medications    Component Value Date/Time   K 4.2 09/25/2017 1450   CREATININE 2.49 (H) 09/25/2017 1450   CREATININE 1.55 (H) 02/28/2016 0906   GLUCOSE 76 09/25/2017 1450   GLUCOSE 210 (H) 07/26/2017 0430    To Keep You Healthy Your are due for the following Health Maintenance Items:  Health Maintenance Due  Topic Date Due  . COLONOSCOPY  06/29/2001  . OPHTHALMOLOGY EXAM  09/25/2018  . HEMOGLOBIN A1C  12/22/2018    Please schedule an appointment with your healthcare provider for any questions or concerns regarding your health or any of the items above.

## 2018-12-23 NOTE — Progress Notes (Signed)
Established Patient - Acute Visit Subjective  Subjective  Patient ID: MRN [id number]  Date of birth: Dec 29, 1950   PCP: [name], MD  CC: DM F/u  HPI: Anthony [name] is a 68 y.o. male with past medical history significant for IDDM who presents today with the following problems:  Diabetes Mellitus: Patient presents for follow up of diabetes.  Patient's medications include 50 u humalin 70/30 daily, 0.'75mg'$  trulicity weekly and 5 rosuvastatin Symptoms: none. Symptoms have been well-controlled and progressed to a point and plateaued. Patient denies foot ulcerations, increase appetite, nausea, paresthesia of the feet, polydipsia, polyuria, visual disturbances, vomitting and weight loss.  Evaluation to date has been included: hemoglobin A1C.  Home sugars: BGs are running  consistent with Hgb A1C.  Making an appointment in November with the eye doctor. Last appt was last year.  Last Friday, was 125. 50 units daily in the morning. Walking for exercise, has to take breaks for sciatic pain ~ q 5 minutes.   HTN Never hospitalization. At home, stays under 140/ 90. Compliant with medications at home. Meds include lasix, labetalol, hydralizine  HISTORY Medications, allergies, medical history, family history and social history were reviewed and edited as necessary. Pertinent findings included in HPI.  Social Hx: Anthony [name] reports that he has been smoking cigarettes. He started smoking about 51 years ago. He has a 11.25 pack-year smoking history. He has never used smokeless tobacco. He reports that he does not drink alcohol or use drugs. ROS: See HPI    Objective   Objective  Physical Exam:  BP 138/86   Pulse 97   Wt 247 lb 9.6 oz (112.3 kg)   SpO2 99%   BMI 32.67 kg/m  General: NAD, non-toxic, well-appearing elderly african [place] male, sitting comfortably in chair   HEENT: [place]/AT. PERRLA. EOMI.  Cardiovascular: irregular rhythm, normal S1, S2. B/L 2+ RP. Mild BLEE, compression stocking in  place to above knees  Respiratory: CTAB. No IWOB.  Abdomen: + BS. NT, ND, soft to palpation.  Extremities: Warm and well perfused. Moving spontaneously.  Integumentary: No obvious rashes, lesions, trauma on general exam. Neuro: A & O x4. CN grossly intact. No FND  Pertinent Labs & Imaging:  Results for OAKLEN, GALLIHUGH (MRN [id number]) as of 12/27/2018 13:52  06/18/2017 02:21 06/19/2017 04:24 06/20/2017 02:36 07/02/2017 10:03 07/25/2017 10:14 07/26/2017 04:30 08/08/2017 11:49 09/02/2017 16:26 09/11/2017 15:38 09/25/2017 14:50 12/23/2018 14:52  Creatinine 1.92 (H) 1.93 (H) 2.17 (H) 2.42 (H) 2.24 (H) 2.09 (H) 2.45 (H) 2.50 (H) 2.38 (H) 2.49 (H) 2.94 (H)   GFR 24  Platelets 127, stable   Assessment  Assessment & Plan  CKD stage 3 due to type 2 diabetes mellitus ([place]) Patient's creatinine continues to worsen.  Obtained creatinine for this visit and was 2.94.  It appears that it is slowly been rising from just under 2 through 2019 to now.  Patient follows with [facility]. Currently on lasix for hypertension and CHF. Will avoid other neprhotoxic agents in this patient.  -- defer to [facility] on  -- optimize BP and DM control, patient is meeting goals at this time     HYPERCHOLESTEROLEMIA Lipid panel with borderline HDL and LDL numbers. Will not make any changes to rosuvastatin at this time.   Thrombocytopenia (HCC) Platelets 127 and stable. Unsure if previously worked up; however, not acutely necessary at this time. Will continue to follow   IDDM (insulin dependent diabetes mellitus) ([place]) No changes in medications as patient is at  goal with a1c. F/u in 6 months for a1c check.   Orders Placed This Encounter  Procedures  . Comprehensive metabolic panel  . CBC  . Magnesium  . Lipid Panel  . Ambulatory referral to Gastroenterology  . POCT glycosylated hemoglobin (Hb A1C)      [name], M.D.  PGY-2  Family Medicine  [phone] 12/27/2018 6:22 PM

## 2018-12-24 ENCOUNTER — Telehealth: Payer: Self-pay | Admitting: *Deleted

## 2018-12-24 ENCOUNTER — Other Ambulatory Visit: Payer: Self-pay | Admitting: Family Medicine

## 2018-12-24 LAB — CBC
Hematocrit: 39.5 % (ref 37.5–51.0)
Hemoglobin: 13.1 g/dL (ref 13.0–17.7)
MCH: 28.2 pg (ref 26.6–33.0)
MCHC: 33.2 g/dL (ref 31.5–35.7)
MCV: 85 fL (ref 79–97)
Platelets: 127 10*3/uL — ABNORMAL LOW (ref 150–450)
RBC: 4.65 x10E6/uL (ref 4.14–5.80)
RDW: 14.2 % (ref 11.6–15.4)
WBC: 5.9 10*3/uL (ref 3.4–10.8)

## 2018-12-24 LAB — COMPREHENSIVE METABOLIC PANEL
ALT: 19 IU/L (ref 0–44)
AST: 20 IU/L (ref 0–40)
Albumin/Globulin Ratio: 1.6 (ref 1.2–2.2)
Albumin: 4 g/dL (ref 3.8–4.8)
Alkaline Phosphatase: 95 IU/L (ref 39–117)
BUN/Creatinine Ratio: 6 — ABNORMAL LOW (ref 10–24)
BUN: 19 mg/dL (ref 8–27)
Bilirubin Total: 0.8 mg/dL (ref 0.0–1.2)
CO2: 25 mmol/L (ref 20–29)
Calcium: 9.5 mg/dL (ref 8.6–10.2)
Chloride: 103 mmol/L (ref 96–106)
Creatinine, Ser: 2.94 mg/dL — ABNORMAL HIGH (ref 0.76–1.27)
GFR calc Af Amer: 24 mL/min/{1.73_m2} — ABNORMAL LOW (ref >59)
GFR calc non Af Amer: 21 mL/min/{1.73_m2} — ABNORMAL LOW (ref >59)
Globulin, Total: 2.5 g/dL (ref 1.5–4.5)
Glucose: 95 mg/dL (ref 65–99)
Potassium: 4.9 mmol/L (ref 3.5–5.2)
Sodium: 144 mmol/L (ref 134–144)
Total Protein: 6.5 g/dL (ref 6.0–8.5)

## 2018-12-24 LAB — LIPID PANEL
Chol/HDL Ratio: 4.5 ratio (ref 0.0–5.0)
Cholesterol, Total: 165 mg/dL (ref 100–199)
HDL: 37 mg/dL — ABNORMAL LOW (ref >39)
LDL Calculated: 100 mg/dL — ABNORMAL HIGH (ref 0–99)
Triglycerides: 139 mg/dL (ref 0–149)
VLDL Cholesterol Cal: 28 mg/dL (ref 5–40)

## 2018-12-24 LAB — MAGNESIUM: Magnesium: 1.7 mg/dL (ref 1.6–2.3)

## 2018-12-24 NOTE — Telephone Encounter (Signed)
Pt calls and states that [facility] for his trulicity and novolin faxed to  [phone].  To PCP. Christen Bame, CMA

## 2018-12-27 ENCOUNTER — Encounter: Payer: Self-pay | Admitting: Family Medicine

## 2018-12-27 NOTE — Assessment & Plan Note (Signed)
Lipid panel with borderline HDL and LDL numbers. Will not make any changes to rosuvastatin at this time.

## 2018-12-27 NOTE — Assessment & Plan Note (Signed)
No changes in medications as patient is at goal with a1c. F/u in 6 months for a1c check.

## 2018-12-27 NOTE — Assessment & Plan Note (Signed)
Platelets 127 and stable. Unsure if previously worked up; however, not acutely necessary at this time. Will continue to follow

## 2018-12-27 NOTE — Assessment & Plan Note (Addendum)
Patient's creatinine continues to worsen.  Obtained creatinine for this visit and was 2.94.  It appears that it is slowly been rising from just under 2 through 2019 to now.  Patient follows with [facility]. Currently on lasix for hypertension and CHF. Will avoid other neprhotoxic agents in this patient.  -- defer to [facility] on  -- optimize BP and DM control, patient is meeting goals at this time

## 2018-12-30 ENCOUNTER — Encounter: Payer: Self-pay | Admitting: Gastroenterology

## 2018-12-30 ENCOUNTER — Ambulatory Visit (INDEPENDENT_AMBULATORY_CARE_PROVIDER_SITE_OTHER): Payer: HMO | Admitting: Gastroenterology

## 2018-12-30 ENCOUNTER — Telehealth: Payer: Self-pay

## 2018-12-30 VITALS — BP 142/80 | HR 88 | Ht 73.0 in | Wt 244.0 lb

## 2018-12-30 DIAGNOSIS — Z86010 Personal history of colonic polyps: Secondary | ICD-10-CM

## 2018-12-30 DIAGNOSIS — Z7901 Long term (current) use of anticoagulants: Secondary | ICD-10-CM

## 2018-12-30 MED ORDER — NA SULFATE-K SULFATE-MG SULF 17.5-3.13-1.6 GM/177ML PO SOLN: 1.0000 | Freq: Once | ORAL | 0 refills | Status: AC

## 2018-12-30 NOTE — Progress Notes (Signed)
History of Present Illness: This is a 68 year old male returning for a personal history of colon polyps. Colonoscopy in 2004 had several tubular adenomas and hyperplastic polyps. He was lost to follow up for surveillance until 2017 when he was scheduled for colonoscopy in 2017 but did not proceed.  He has no gastrointestinal complaints.  He is maintained on Eliquis for atrial fibrillation.  Additional cardiac problems as outlined below. Denies weight loss, abdominal pain, constipation, diarrhea, change in stool caliber, melena, hematochezia, nausea, vomiting, dysphagia, reflux symptoms, chest pain.     Allergies  Allergen Reactions  . Enalapril Maleate Cough   Outpatient Medications Prior to Visit  Medication Sig Dispense Refill  . apixaban (ELIQUIS) 5 MG TABS tablet Take 1 tablet (5 mg total) by mouth 2 (two) times daily. 180 tablet 1  . B-D ULTRAFINE III SHORT PEN 31G X 8 MM MISC 1 UNITS BY DOES NOT APPLY ROUTE AS NEEDED.  11  . Blood Glucose Monitoring Suppl (ONETOUCH VERIO) w/Device KIT Use kit to check blood sugars. 1 kit 0  . calcitRIOL (ROCALTROL) 0.5 MCG capsule Take by mouth daily.    . Dulaglutide (TRULICITY) 5.70 VX/7.9TJ SOPN Inject 0.75 mg into the skin once a week.    . furosemide (LASIX) 40 MG tablet     . gabapentin (NEURONTIN) 300 MG capsule TAKE ONE CAPSULE BY MOUTH EVERY NIGHT AT BEDTIME 90 capsule 0  . glucose blood (ONETOUCH VERIO) test strip Use as instructed 100 each 12  . hydrALAZINE (APRESOLINE) 25 MG tablet Take 1 tablet (25 mg total) by mouth 3 (three) times daily. 90 tablet 8  . Insulin Isophane & Regular Human (NOVOLIN 70/30 FLEXPEN) (70-30) 100 UNIT/ML PEN Inject 50 Units into the skin daily. 15 mL 11  . KLOR-CON M20 20 MEQ tablet TAKE 1 TABLET BY MOUTH TWICE A DAY 180 tablet 3  . labetalol (NORMODYNE) 200 MG tablet TAKE 2 TABLETS (400 MG TOTAL) BY MOUTH 2 (TWO) TIMES DAILY. 360 tablet 1  . Lancets (ONETOUCH ULTRASOFT) lancets Use as instructed 100 each 12   . Multiple Vitamin (MULTIVITAMIN) tablet Take 1 tablet by mouth daily.      . nitroGLYCERIN (NITROSTAT) 0.4 MG SL tablet Place 0.4 mg under the tongue every 5 (five) minutes as needed for chest pain. If no relief by 3rd tab, call 911     . omeprazole (PRILOSEC) 40 MG capsule TAKE ONE CAPSULE BY MOUTH DAILY 90 capsule 3  . oxymorphone (OPANA) 5 MG tablet Take 5 mg by mouth every 6 (six) hours as needed for pain (max 3 tablets daily). Per Dr Brien Few    . PARoxetine (PAXIL) 40 MG tablet TAKE ONE TABLET BY MOUTH EVERY MORNING 90 tablet 1  . polyethylene glycol (MIRALAX / GLYCOLAX) packet Take 17 g by mouth daily. 14 each 0  . rosuvastatin (CRESTOR) 5 MG tablet TAKE 1 TABLET BY MOUTH EVERY DAY 90 tablet 0   No facility-administered medications prior to visit.    Past Medical History:  Diagnosis Date  . Arthritis   . CAD in native artery    a. reported MI 2000, 2001, s/p stenting of the circumflex lesion extending into the second obtuse marginal branch with a drug-eluting stent in 2003. b. low risk nuc 2015.  Marland Kitchen Chronic atrial fibrillation   . Chronic combined systolic and diastolic CHF (congestive heart failure) ([place])    a. Previously diastolic, then EF 03-00% in 10/2016.  . CKD (chronic kidney disease), stage III (  [place])   . Depression   . Diabetes mellitus   . Edema of extremities   . Erectile dysfunction   . GERD (gastroesophageal reflux disease)   . Hyperlipidemia   . Hypertension   . Myocardial infarction ([place]) 2000&2001  . Obesity   . Onychomycosis   . OSA (obstructive sleep apnea)   . S/P ICD (internal cardiac defibrillator) procedure and BiV device,  07/25/17 Medtronic  07/26/2017  . Seizures ([place])   . Sleep apnea   . Stroke ([place])   . Tubular adenoma of colon 10/2002   Past Surgical History:  Procedure Laterality Date  . ANGIOPLASTY  2001   stent x 1  . AV NODE ABLATION N/A 08/20/2017   Procedure: AV NODE ABLATION;  Surgeon: Deboraha Sprang, MD;  Location: [facility];   Service: Cardiovascular;  Laterality: N/A;  . BIV ICD INSERTION CRT-D N/A 07/25/2017   Procedure: BIV ICD INSERTION CRT-D;  Surgeon: Deboraha Sprang, MD;  Location: [facility];  Service: Cardiovascular;  Laterality: N/A;  . COLONOSCOPY  2000?   negative  . FOOT ARTHROTOMY Right   . TOE AMPUTATION Left 2012   Social History   Socioeconomic History  . Marital status: Married    Spouse name: [name]  . Number of children: 2  . Years of education: GED  . Highest education level: Not on file  Occupational History  . Occupation: CUSTOMER SERVICES    Employer: [employer]    Comment: [employer]  Social Needs  . Financial resource strain: Somewhat hard  . Food insecurity    Worry: Not on file    Inability: Not on file  . Transportation needs    Medical: No    Non-medical: No  Tobacco Use  . Smoking status: Light Tobacco Smoker    Packs/day: 0.25    Years: 45.00    Pack years: 11.25    Types: Cigarettes    Start date: 05/28/1967  . Smokeless tobacco: Never Used  Substance and Sexual Activity  . Alcohol use: No    Comment: quit in 1993  . Drug use: No  . Sexual activity: Not on file  Lifestyle  . Physical activity    Days per week: Not on file    Minutes per session: Not on file  . Stress: Not on file  Relationships  . Social Herbalist on phone: Not on file    Gets together: Not on file    Attends religious service: Not on file    Active member of club or organization: Not on file    Attends meetings of clubs or organizations: Not on file    Relationship status: Not on file  Other Topics Concern  . Not on file  Social History Narrative   Patient is right handed, consumes caffeine rarely. Patient resides in home with wife.   Family History  Problem Relation Age of Onset  . Heart attack Mother   . CVA Mother   . Diabetes Mother   . Heart attack Father   . Colon cancer Neg Hx   . Stomach cancer Neg Hx         Physical Exam: General: Well developed, well  nourished, no acute distress Head: Normocephalic and atraumatic Eyes:  sclerae anicteric, EOMI Ears: Normal auditory acuity Mouth: No deformity or lesions Lungs: Clear throughout to auscultation Heart: Regular rate and rhythm; no murmurs, rubs or bruits Abdomen: Soft, non tender and non distended. No masses, hepatosplenomegaly  or hernias noted. Normal Bowel sounds Rectal: Deferred to colonoscopy Musculoskeletal: Symmetrical with no gross deformities  Pulses:  Normal pulses noted Extremities: No clubbing, cyanosis, edema or deformities noted Neurological: Alert oriented x 4, grossly nonfocal Psychological:  Alert and cooperative. Normal mood and affect   Assessment and Recommendations:  1. Personal history of adenomatous colon polyps.  He is way overdue for surveillance colonoscopy.  Schedule colonoscopy.  He relates vomiting with his second dose of MoviPrep in 2017 and could not complete the prep.  Suprep with Reglan 10 mg p.o. 30 minutes before both doses. The risks (including bleeding, perforation, infection, missed lesions, medication reactions and possible hospitalization or surgery if complications occur), benefits, and alternatives to colonoscopy with possible biopsy and possible polypectomy were discussed with the patient and they consent to proceed.   2. Hold Eliquis 2 days before procedure - will instruct when and how to resume after procedure. Low but real risk of cardiovascular event such as heart attack, stroke, embolism, thrombosis or ischemia/infarct of other organs off Eliquis explained and need to seek urgent help if this occurs. The patient consents to proceed. Will communicate by phone or EMR with patient's prescribing provider to confirm that holding Eliquis is reasonable in this case.   3. NICM, CHF with EF=30%  4. OSA.   5. S/P ICD

## 2018-12-30 NOTE — Patient Instructions (Signed)
You have been scheduled for a colonoscopy. Please follow written instructions given to you at your visit today.  Please pick up your prep supplies at the pharmacy within the next 1-3 days. If you use inhalers (even only as needed), please bring them with you on the day of your procedure.  Due to recent COVID-19 restrictions implemented by our local and state authorities and in an effort to keep both patients and staff as safe as possible, our hospital system now requires COVID-19 testing prior to any scheduled hospital procedure. Please go to our [facility] location drive thru testing site ([address] [place], [place] [address]) on 02/04/19, any time between 9:30am - 3 pm (Wednesday hours are 9:30 am-12 pm and Saturday hours are 9 am-12:30 pm). There will be multiple testing areas, the first checkpoint being for pre-procedure/surgery testing. Get into the right (yellow) lane that leads to the PAT testing team. You will not be billed at the time of testing but may receive a bill later depending on your insurance. The approximate cost of the test is $100. You must agree to quarantine from the time of your testing until the procedure date on 02/08/19 . This should include staying at home with ONLY the people you live with. Avoid take-out, grocery store shopping or leaving the house for any non-emergent reason. Please call our office at [phone] if you have any questions.   Thank you for choosing me and [facility].  Pricilla Riffle. Dagoberto Ligas., MD., Marval Regal

## 2018-12-30 NOTE — Telephone Encounter (Signed)
 [facility] Pre-operative Risk Assessment     Request for surgical clearance:     Endoscopy Procedure  What type of surgery is being performed?     Colonoscopy at [facility]  When is this surgery scheduled?     02/08/19  What type of clearance is required ?   Pharmacy  Are there any medications that need to be held prior to surgery and how long? Eliquis x 2 days  Practice name and name of physician performing surgery?      [facility]  What is your office phone and fax number?      Phone- [phone]  Fax661 [phone]  Anesthesia type (None, local, MAC, general) ?       MAC

## 2018-12-31 ENCOUNTER — Telehealth: Payer: Self-pay

## 2018-12-31 MED ORDER — METOCLOPRAMIDE HCL 10 MG PO TABS: ORAL_TABLET | ORAL | 0 refills | Status: DC

## 2018-12-31 NOTE — Telephone Encounter (Signed)
Pt takes Eliquis for afib with CHADS2VASc score of 5 (age, CHF, HTN, DM, CAD). SCr 2.94, CrCl 68mL/min. Ok to hold Eliquis for 2 days prior to procedure.

## 2018-12-31 NOTE — Telephone Encounter (Signed)
Patient informed per Cardiology he can hold Eliquis 2 days prior to his procedure. Patient verbalized understanding.

## 2018-12-31 NOTE — Telephone Encounter (Signed)
Informed patient Dr. Fuller Plan wanted patient to tae reglan 10 mg prior to drinking each prep dose for his Colonoscopy. Patient got sick drinking his prep at the last colonoscopy. Informed patient he will take 1 tablet by mouth one hour prior to each prep dose. Patient verbalized understanding. Prescription sent to the pharmacy.

## 2018-12-31 NOTE — Telephone Encounter (Signed)
   Primary Cardiologist: Ena Dawley, MD  Chart reviewed as part of pre-operative protocol coverage. Per pharmacy recommendations, patient can hold eliquis 2 days prior to his upcoming colonoscopy. He should restart eliquis as soon as he is cleared to do so by his gastroenterologist.   I will route this recommendation to the requesting party via [facility] fax function and remove from pre-op pool.  Please call with questions.  Abigail Butts, PA-C 12/31/2018, 9:13 AM

## 2018-12-31 NOTE — Patient Outreach (Signed)
[facility]) Care Management  [place]   12/31/2018  Anthony [name] [birth year] [id number]  Reason for referral: HTA C-SNP, medication management/assistance  Successful routine outreach to Anthony [name] with HIPAA identifiers verified. Patient is HTA-CSNP patient and [facility] is following him for DM management and PAPs for medications. Patient states he is doing well today. He states his FBG have been <130 and reports 100% compliance with ALL medications. His most recent A1c was 8.0 on 12/23/18 (down from 8.2 on 08/2017). Encouraged patient to continue his progress.  He denies current or recent illnesses. Reinforced DM healthy diet. Patient verbalizes understanding and appreciative of call.  He has an upcoming colonoscopy.  Reminded patient he is $110 out of pocket spend away from applying for Eliquis.  Confirmed with [facility] CPhT, Etter Sjogren who is assisting with process.  Appreciate assistance.   PLAN: -Patient appears stable and capable of managing his disease state. I will continue to follow up with him quarterly.    Regina Eck, PharmD, [place]  [phone]   Objective: The 10-year ASCVD risk score Mikey Bussing DC Jr., et al., 2013) is: 54.5%   Values used to calculate the score:     Age: 68 years     Sex: Male     Is Non-Hispanic African American: Yes     Diabetic: Yes     Tobacco smoker: Yes     Systolic Blood Pressure: 641 mmHg     Is BP treated: Yes     HDL Cholesterol: 37 mg/dL     Total Cholesterol: 165 mg/dL  Lab Results  Component Value Date   CREATININE 2.94 (H) 12/23/2018   CREATININE 2.49 (H) 09/25/2017   CREATININE 2.38 (H) 09/11/2017    Lab Results  Component Value Date   HGBA1C 8.0 (A) 12/23/2018    Lipid Panel     Component Value Date/Time   CHOL 165 12/23/2018 1452   TRIG 139 12/23/2018 1452   HDL 37 (L) 12/23/2018 1452   CHOLHDL 4.5 12/23/2018 1452   CHOLHDL 3.2  02/28/2016 0906   VLDL 19 02/28/2016 0906   LDLCALC 100 (H) 12/23/2018 1452   LDLDIRECT 58 11/06/2011 1434    BP Readings from Last 3 Encounters:  12/30/18 (!) 142/80  12/23/18 138/86  06/23/18 (!) 145/80    Allergies  Allergen Reactions  . Enalapril Maleate Cough    Medications Reviewed Today    Reviewed by Ladene Artist, MD (Physician) on 12/30/18 at 1522  Med List Status: <None>  Medication Order Taking? Sig Documenting Provider Last Dose Status Informant  apixaban (ELIQUIS) 5 MG TABS tablet [id number]  Take 1 tablet (5 mg total) by mouth 2 (two) times daily. Deboraha Sprang, MD  Active   B-D ULTRAFINE III SHORT PEN 31G X 8 MM MISC [id number] No 1 UNITS BY DOES NOT APPLY ROUTE AS NEEDED. [provider] Taking Active   Blood Glucose Monitoring Suppl [facility] VERIO) w/Device KIT [id number] No Use kit to check blood sugars. Sela Hilding, MD Taking Active   calcitRIOL (ROCALTROL) 0.5 MCG capsule [id number] No Take by mouth daily. [provider] Taking Active   Dulaglutide (TRULICITY) 1.77 NH/6.5BX SOPN [id number] No Inject 0.75 mg into the skin once a week. [provider] Taking Active            Med Note Blanca Friend, Azaylah Stailey D   Wed Sep 23, 2018 10:32 AM) Patient receiving free through manufacturer program Horticulturist, commercial)  furosemide (LASIX) 40 MG tablet [id number] No  [provider] Taking Active   gabapentin (NEURONTIN) 300 MG capsule [id number]  TAKE ONE CAPSULE BY MOUTH EVERY NIGHT AT BEDTIME Sela Hilding, MD  Active   glucose blood [facility] VERIO) test strip [id number] No Use as instructed Sela Hilding, MD Taking Active   hydrALAZINE (APRESOLINE) 25 MG tablet [id number] No Take 1 tablet (25 mg total) by mouth 3 (three) times daily. Deboraha Sprang, MD Taking Active   Insulin Isophane & Regular Human (NOVOLIN 70/30 FLEXPEN) (70-30) 100 UNIT/ML PEN [id number] No Inject 50 Units into the skin daily. Sela Hilding, MD Taking Active    KLOR-CON M20 20 MEQ tablet [id number] No TAKE 1 TABLET BY MOUTH TWICE A DAY Lyda Jester M, PA-C Taking Active   labetalol (NORMODYNE) 200 MG tablet [id number] No TAKE 2 TABLETS (400 MG TOTAL) BY MOUTH 2 (TWO) TIMES DAILY. Deboraha Sprang, MD Taking Active   Lancets [facility] ULTRASOFT) lancets [id number] No Use as instructed Sela Hilding, MD Taking Active   Multiple Vitamin (MULTIVITAMIN) tablet [id number] No Take 1 tablet by mouth daily.   [provider] Taking Active Self  nitroGLYCERIN (NITROSTAT) 0.4 MG SL tablet [id number] No Place 0.4 mg under the tongue every 5 (five) minutes as needed for chest pain. If no relief by 3rd tab, call 911  [provider] Taking Active Self           Med Note Mart Piggs Aug 20, 2017 10:08 AM) Never used  omeprazole (PRILOSEC) 40 MG capsule [id number] No TAKE ONE CAPSULE BY MOUTH DAILY Sela Hilding, MD Taking Active   oxymorphone (OPANA) 5 MG tablet [id number] No Take 5 mg by mouth every 6 (six) hours as needed for pain (max 3 tablets daily). Per Dr Alvin Critchley, MD Taking Active Self           Med Note Blanca Friend, Iven Finn Jun 16, 2018  4:47 PM) Patient only taking TWICE DAILY  PARoxetine (PAXIL) 40 MG tablet [id number] No TAKE ONE TABLET BY MOUTH EVERY MORNING Sela Hilding, MD Taking Active   polyethylene glycol [facility] / GLYCOLAX) packet [id number] No Take 17 g by mouth daily. Kathrene Alu, MD Taking Active Self  rosuvastatin (CRESTOR) 5 MG tablet [id number]  TAKE 1 TABLET BY MOUTH EVERY DAY Consuelo Pandy, PA-C  Active   Med List Note Saunders Revel T, [place] 08/13/17 1152): CPAP

## 2019-01-04 ENCOUNTER — Ambulatory Visit: Payer: Self-pay | Admitting: Pharmacist

## 2019-01-05 ENCOUNTER — Ambulatory Visit: Payer: BC Managed Care – PPO | Admitting: Podiatry

## 2019-01-05 NOTE — Telephone Encounter (Signed)
In PCP box for authorization and signature.

## 2019-01-06 NOTE — Telephone Encounter (Signed)
Faxed from [phone] today at 2:45PM.

## 2019-01-11 ENCOUNTER — Telehealth: Payer: Self-pay

## 2019-01-11 ENCOUNTER — Other Ambulatory Visit: Payer: Self-pay | Admitting: Family Medicine

## 2019-01-11 MED ORDER — ONETOUCH VERIO VI STRP: ORAL_STRIP | 12 refills | Status: DC

## 2019-01-11 NOTE — Telephone Encounter (Signed)
Patient calls nurse line stating he needs new test strips for his new meter. Patient he needs one touch delica test strips. Please advise.

## 2019-01-13 ENCOUNTER — Other Ambulatory Visit: Payer: Self-pay | Admitting: *Deleted

## 2019-01-13 MED ORDER — ONETOUCH ULTRASOFT LANCETS MISC: 12 refills | Status: DC

## 2019-01-15 ENCOUNTER — Other Ambulatory Visit: Payer: Self-pay | Admitting: Cardiology

## 2019-01-18 ENCOUNTER — Telehealth: Payer: Self-pay | Admitting: Internal Medicine

## 2019-01-18 MED ORDER — FUROSEMIDE 40 MG PO TABS: 40.0000 mg | ORAL_TABLET | Freq: Every day | ORAL | 2 refills | Status: DC

## 2019-01-18 NOTE — Telephone Encounter (Signed)
Pt calling requesting a refill on Furosemide 40 mg tablet. This medication was prescribed by another provider. Would Dr. Caryl Comes like to refill this medication? Please address

## 2019-01-18 NOTE — Telephone Encounter (Signed)
 *  STAT* If patient is at the pharmacy, call can be transferred to refill team.   1. Which medications need to be refilled? (please list name of each medication and dose if known) furosemide (LASIX) 40 MG tablet  2. Which pharmacy/location (including street and city if local pharmacy) is medication to be sent to? [facility]  3. Do they need a 30 day or 90 day supply? 30  Patient is completely out of medication

## 2019-01-18 NOTE — Telephone Encounter (Signed)
30 day refill x 2 sent in for pt. Pt will need to schedule follow up appt with Dr. Caryl Comes for additional refills.

## 2019-01-20 DIAGNOSIS — N2581 Secondary hyperparathyroidism of renal origin: Secondary | ICD-10-CM | POA: Diagnosis not present

## 2019-01-20 DIAGNOSIS — E1122 Type 2 diabetes mellitus with diabetic chronic kidney disease: Secondary | ICD-10-CM | POA: Diagnosis not present

## 2019-01-20 DIAGNOSIS — I129 Hypertensive chronic kidney disease with stage 1 through stage 4 chronic kidney disease, or unspecified chronic kidney disease: Secondary | ICD-10-CM | POA: Diagnosis not present

## 2019-01-20 DIAGNOSIS — N184 Chronic kidney disease, stage 4 (severe): Secondary | ICD-10-CM | POA: Diagnosis not present

## 2019-01-20 NOTE — Telephone Encounter (Signed)
Noted  

## 2019-01-27 ENCOUNTER — Telehealth: Payer: Self-pay | Admitting: Internal Medicine

## 2019-01-27 ENCOUNTER — Telehealth: Payer: Self-pay

## 2019-01-27 DIAGNOSIS — I739 Peripheral vascular disease, unspecified: Secondary | ICD-10-CM

## 2019-01-27 MED ORDER — FUROSEMIDE 40 MG PO TABS: 40.0000 mg | ORAL_TABLET | Freq: Two times a day (BID) | ORAL | 3 refills | Status: DC

## 2019-01-27 NOTE — Telephone Encounter (Signed)
Pt calling stating that his medication Furosemide 40 mg tablet was sent to his pharmacy wrong. Pt states that he takes furosemide 40 mg BID and not daily. Pt would like a call back concerning this matter at [phone]. Please address

## 2019-01-27 NOTE — Telephone Encounter (Signed)
I spoke to the patient who called because he is taking his Lasix 40 mg bid and wanted that changed with his pharmacy.  This dosage seems to keep his swelling down.  I told him that I would update.

## 2019-01-27 NOTE — Telephone Encounter (Signed)
Noreene Larsson, nurse with [facility], calls nurse line to report house call findings. Patient was diagnosed with peripheral artery disease per findings. 0.84 left foot and 0.70 right foot. Findings will be faxed to our office. Will forward to PCP.

## 2019-01-27 NOTE — Telephone Encounter (Addendum)
Findings suggest some arterial disease in the left leg and moderate arterial disease in right leg.  Currently waiting on results to be faxed into the office.  Patient is not on any antiplatelet therapy at this time.  He does have a history of thrombocytopenia that is chronic.  Platelets at last appointment were 127.  Patient has other risk factors including diabetes, hypercholesterolemia, CKD 3, hypertension.  Given patient's risk factors and thrombocytopenia, will refer this patient to vascular surgery for further work-up and management.  Please call patient and let him know that he will be receiving a phone call for this referral given the results found by patient's health insurance company.

## 2019-02-02 ENCOUNTER — Ambulatory Visit: Payer: Self-pay | Admitting: Pharmacist

## 2019-02-02 NOTE — Telephone Encounter (Signed)
Pt informed. I gave him the number to [facility]. He will call them on Friday if he hasn't heard from them. Ottis Stain, CMA

## 2019-02-03 ENCOUNTER — Encounter (HOSPITAL_COMMUNITY): Payer: Self-pay | Admitting: *Deleted

## 2019-02-03 NOTE — Progress Notes (Addendum)
SPOKE W/  [place] OF COVID 19:   COUGH--no   RUNNY NOSE--- no  SORE THROAT---no  NASAL CONGESTION----no  SNEEZING----no  SHORTNESS OF BREATH---no  DIFFICULTY BREATHING---no  TEMP >100.0 -----no  UNEXPLAINED BODY ACHES------no  CHILLS -------- no  HEADACHES ---------no  LOSS OF SMELL/ TASTE --------no    HAVE YOU OR ANY FAMILY MEMBER TRAVELLED PAST 14 DAYS OUT OF THE   COUNTY---no STATE----no COUNTRY----no  HAVE YOU OR ANY FAMILY MEMBER BEEN EXPOSED TO ANYONE WITH COVID 19? no

## 2019-02-04 ENCOUNTER — Other Ambulatory Visit (HOSPITAL_COMMUNITY)
Admission: RE | Admit: 2019-02-04 | Discharge: 2019-02-04 | Disposition: A | Discharge status: Home / Self Care | Payer: HMO | Source: Ambulatory Visit | Attending: Gastroenterology | Admitting: Gastroenterology

## 2019-02-04 DIAGNOSIS — Z20828 Contact with and (suspected) exposure to other viral communicable diseases: Secondary | ICD-10-CM | POA: Diagnosis not present

## 2019-02-04 DIAGNOSIS — Z01812 Encounter for preprocedural laboratory examination: Secondary | ICD-10-CM | POA: Insufficient documentation

## 2019-02-05 LAB — NOVEL CORONAVIRUS, NAA (HOSP ORDER, SEND-OUT TO REF LAB; TAT 18-24 HRS): SARS-CoV-2, NAA: NOT DETECTED

## 2019-02-06 NOTE — Anesthesia Preprocedure Evaluation (Addendum)
Anesthesia Evaluation  Patient identified by MRN, date of birth, ID band Patient awake    Reviewed: Allergy & Precautions, NPO status , Patient's Chart, lab work & pertinent test results  History of Anesthesia Complications Negative for: history of anesthetic complications  Airway Mallampati: II  TM Distance: >3 FB Neck ROM: Full    Dental no notable dental hx. (+) Dental Advisory Given   Pulmonary sleep apnea , former smoker,    Pulmonary exam normal        Cardiovascular hypertension, + CAD and + Past MI  Normal cardiovascular exam+ dysrhythmias Atrial Fibrillation + pacemaker + Cardiac Defibrillator   Study Conclusions  - Left ventricle: The cavity size was normal. There was mild   concentric hypertrophy. Systolic function was normal. The   estimated ejection fraction was in the range of 55% to 60%. Wall   motion was normal; there were no regional wall motion   abnormalities. - Aortic valve: Transvalvular velocity was within the normal range.   There was no stenosis. There was no regurgitation. - Mitral valve: Transvalvular velocity was within the normal range.   There was no evidence for stenosis. There was trivial   regurgitation. - Left atrium: The atrium was moderately dilated. - Right ventricle: The cavity size was mildly dilated. Wall   thickness was normal. Systolic function was normal. - Tricuspid valve: There was mild regurgitation. - Pulmonary arteries: Systolic pressure was within the normal   range. PA peak pressure: 21 mm Hg (S).   Neuro/Psych PSYCHIATRIC DISORDERS Depression CVA    GI/Hepatic Neg liver ROS, GERD  ,  Endo/Other  negative endocrine ROSdiabetes  Renal/GU negative Renal ROS     Musculoskeletal negative musculoskeletal ROS (+)   Abdominal   Peds  Hematology negative hematology ROS (+)   Anesthesia Other Findings Day of surgery medications reviewed with the patient.  Reproductive/Obstetrics                            Anesthesia Physical Anesthesia Plan  ASA: III  Anesthesia Plan: MAC   Post-op Pain Management:    Induction:   PONV Risk Score and Plan: Ondansetron and Propofol infusion  Airway Management Planned: Natural Airway  Additional Equipment:   Intra-op Plan:   Post-operative Plan:   Informed Consent: I have reviewed the patients History and Physical, chart, labs and discussed the procedure including the risks, benefits and alternatives for the proposed anesthesia with the patient or authorized representative who has indicated his/her understanding and acceptance.       Plan Discussed with: CRNA and Anesthesiologist  Anesthesia Plan Comments:        Anesthesia Quick Evaluation

## 2019-02-08 ENCOUNTER — Ambulatory Visit (HOSPITAL_COMMUNITY): Payer: HMO | Admitting: Anesthesiology

## 2019-02-08 ENCOUNTER — Other Ambulatory Visit: Payer: Self-pay

## 2019-02-08 ENCOUNTER — Ambulatory Visit (HOSPITAL_COMMUNITY)
Admission: RE | Admit: 2019-02-08 | Discharge: 2019-02-08 | Disposition: A | Discharge status: Home / Self Care | Payer: HMO | Attending: Gastroenterology | Admitting: Gastroenterology

## 2019-02-08 ENCOUNTER — Ambulatory Visit: Payer: Self-pay | Admitting: Pharmacist

## 2019-02-08 ENCOUNTER — Encounter (HOSPITAL_COMMUNITY)
Admission: RE | Disposition: A | Discharge status: Home / Self Care | Payer: Self-pay | Source: Home / Self Care | Attending: Gastroenterology

## 2019-02-08 ENCOUNTER — Encounter (HOSPITAL_COMMUNITY): Payer: Self-pay | Admitting: Gastroenterology

## 2019-02-08 DIAGNOSIS — D124 Benign neoplasm of descending colon: Secondary | ICD-10-CM

## 2019-02-08 DIAGNOSIS — I5042 Chronic combined systolic (congestive) and diastolic (congestive) heart failure: Secondary | ICD-10-CM | POA: Insufficient documentation

## 2019-02-08 DIAGNOSIS — I252 Old myocardial infarction: Secondary | ICD-10-CM | POA: Diagnosis not present

## 2019-02-08 DIAGNOSIS — G4733 Obstructive sleep apnea (adult) (pediatric): Secondary | ICD-10-CM | POA: Insufficient documentation

## 2019-02-08 DIAGNOSIS — Z8673 Personal history of transient ischemic attack (TIA), and cerebral infarction without residual deficits: Secondary | ICD-10-CM | POA: Insufficient documentation

## 2019-02-08 DIAGNOSIS — N183 Chronic kidney disease, stage 3 (moderate): Secondary | ICD-10-CM | POA: Diagnosis not present

## 2019-02-08 DIAGNOSIS — F329 Major depressive disorder, single episode, unspecified: Secondary | ICD-10-CM | POA: Diagnosis not present

## 2019-02-08 DIAGNOSIS — D123 Benign neoplasm of transverse colon: Secondary | ICD-10-CM

## 2019-02-08 DIAGNOSIS — Z7901 Long term (current) use of anticoagulants: Secondary | ICD-10-CM | POA: Diagnosis not present

## 2019-02-08 DIAGNOSIS — M1990 Unspecified osteoarthritis, unspecified site: Secondary | ICD-10-CM | POA: Insufficient documentation

## 2019-02-08 DIAGNOSIS — Z1211 Encounter for screening for malignant neoplasm of colon: Secondary | ICD-10-CM | POA: Diagnosis not present

## 2019-02-08 DIAGNOSIS — D125 Benign neoplasm of sigmoid colon: Secondary | ICD-10-CM | POA: Diagnosis not present

## 2019-02-08 DIAGNOSIS — K635 Polyp of colon: Secondary | ICD-10-CM | POA: Insufficient documentation

## 2019-02-08 DIAGNOSIS — Z955 Presence of coronary angioplasty implant and graft: Secondary | ICD-10-CM | POA: Diagnosis not present

## 2019-02-08 DIAGNOSIS — F17210 Nicotine dependence, cigarettes, uncomplicated: Secondary | ICD-10-CM | POA: Insufficient documentation

## 2019-02-08 DIAGNOSIS — K5730 Diverticulosis of large intestine without perforation or abscess without bleeding: Secondary | ICD-10-CM | POA: Diagnosis not present

## 2019-02-08 DIAGNOSIS — E669 Obesity, unspecified: Secondary | ICD-10-CM | POA: Diagnosis not present

## 2019-02-08 DIAGNOSIS — Z86010 Personal history of colonic polyps: Secondary | ICD-10-CM | POA: Diagnosis not present

## 2019-02-08 DIAGNOSIS — E1122 Type 2 diabetes mellitus with diabetic chronic kidney disease: Secondary | ICD-10-CM | POA: Diagnosis not present

## 2019-02-08 DIAGNOSIS — E785 Hyperlipidemia, unspecified: Secondary | ICD-10-CM | POA: Insufficient documentation

## 2019-02-08 DIAGNOSIS — Z95810 Presence of automatic (implantable) cardiac defibrillator: Secondary | ICD-10-CM | POA: Diagnosis not present

## 2019-02-08 DIAGNOSIS — Z794 Long term (current) use of insulin: Secondary | ICD-10-CM | POA: Diagnosis not present

## 2019-02-08 DIAGNOSIS — Z79899 Other long term (current) drug therapy: Secondary | ICD-10-CM | POA: Insufficient documentation

## 2019-02-08 DIAGNOSIS — K640 First degree hemorrhoids: Secondary | ICD-10-CM | POA: Insufficient documentation

## 2019-02-08 DIAGNOSIS — K219 Gastro-esophageal reflux disease without esophagitis: Secondary | ICD-10-CM | POA: Insufficient documentation

## 2019-02-08 DIAGNOSIS — Z860101 Personal history of adenomatous and serrated colon polyps: Secondary | ICD-10-CM

## 2019-02-08 DIAGNOSIS — I2510 Atherosclerotic heart disease of native coronary artery without angina pectoris: Secondary | ICD-10-CM | POA: Diagnosis not present

## 2019-02-08 DIAGNOSIS — I130 Hypertensive heart and chronic kidney disease with heart failure and stage 1 through stage 4 chronic kidney disease, or unspecified chronic kidney disease: Secondary | ICD-10-CM | POA: Diagnosis not present

## 2019-02-08 DIAGNOSIS — I482 Chronic atrial fibrillation, unspecified: Secondary | ICD-10-CM | POA: Insufficient documentation

## 2019-02-08 DIAGNOSIS — Z6831 Body mass index (BMI) 31.0-31.9, adult: Secondary | ICD-10-CM | POA: Insufficient documentation

## 2019-02-08 HISTORY — PX: POLYPECTOMY: SHX5525

## 2019-02-08 HISTORY — PX: COLONOSCOPY WITH PROPOFOL: SHX5780

## 2019-02-08 LAB — GLUCOSE, CAPILLARY: Glucose-Capillary: 190 mg/dL — ABNORMAL HIGH (ref 70–99)

## 2019-02-08 SURGERY — COLONOSCOPY WITH PROPOFOL
Anesthesia: Monitor Anesthesia Care

## 2019-02-08 MED ORDER — HYDRALAZINE HCL 20 MG/ML IJ SOLN
INTRAMUSCULAR | Status: DC | PRN
  Administered 2019-02-08: 5 mg via INTRAVENOUS

## 2019-02-08 MED ORDER — SODIUM CHLORIDE 0.9 % IV SOLN
INTRAVENOUS | Status: DC
  Administered 2019-02-08: 1000 mL via INTRAVENOUS

## 2019-02-08 MED ORDER — PROPOFOL 500 MG/50ML IV EMUL
INTRAVENOUS | Status: DC | PRN
  Administered 2019-02-08: 150 ug/kg/min via INTRAVENOUS

## 2019-02-08 MED ORDER — PROPOFOL 10 MG/ML IV BOLUS
INTRAVENOUS | Status: AC
  Filled 2019-02-08: qty 40

## 2019-02-08 MED ORDER — ONDANSETRON HCL 4 MG/2ML IJ SOLN
INTRAMUSCULAR | Status: DC | PRN
  Administered 2019-02-08: 4 mg via INTRAVENOUS

## 2019-02-08 SURGICAL SUPPLY — 22 items

## 2019-02-08 NOTE — Discharge Instructions (Signed)

## 2019-02-08 NOTE — Telephone Encounter (Signed)
Noted Legrand Como  If this is a new and stable dose, can we please arrange for a BMET  Thanks SK

## 2019-02-08 NOTE — H&P (Signed)
History of Present Illness: This is a 68 year old male returning for a personal history of colon polyps. Colonoscopy in 2004 had several tubular adenomas and hyperplastic polyps. He was lost to follow up for surveillance until 2017 when he was scheduled for colonoscopy in 2017 but did not proceed.  He has no gastrointestinal complaints.  He is maintained on Eliquis for atrial fibrillation.  Additional cardiac and other medical problems as outlined below. Denies weight loss, abdominal pain, constipation, diarrhea, change in stool caliber, melena, hematochezia, nausea, vomiting, dysphagia, reflux symptoms, chest pain.        Allergies  Allergen Reactions  . Enalapril Maleate Cough         Outpatient Medications Prior to Visit  Medication Sig Dispense Refill  . apixaban (ELIQUIS) 5 MG TABS tablet Take 1 tablet (5 mg total) by mouth 2 (two) times daily. 180 tablet 1  . B-D ULTRAFINE III SHORT PEN 31G X 8 MM MISC 1 UNITS BY DOES NOT APPLY ROUTE AS NEEDED.  11  . Blood Glucose Monitoring Suppl (ONETOUCH VERIO) w/Device KIT Use kit to check blood sugars. 1 kit 0  . calcitRIOL (ROCALTROL) 0.5 MCG capsule Take by mouth daily.    . Dulaglutide (TRULICITY) 1.21 FX/5.8IT SOPN Inject 0.75 mg into the skin once a week.    . furosemide (LASIX) 40 MG tablet     . gabapentin (NEURONTIN) 300 MG capsule TAKE ONE CAPSULE BY MOUTH EVERY NIGHT AT BEDTIME 90 capsule 0  . glucose blood (ONETOUCH VERIO) test strip Use as instructed 100 each 12  . hydrALAZINE (APRESOLINE) 25 MG tablet Take 1 tablet (25 mg total) by mouth 3 (three) times daily. 90 tablet 8  . Insulin Isophane & Regular Human (NOVOLIN 70/30 FLEXPEN) (70-30) 100 UNIT/ML PEN Inject 50 Units into the skin daily. 15 mL 11  . KLOR-CON M20 20 MEQ tablet TAKE 1 TABLET BY MOUTH TWICE A DAY 180 tablet 3  . labetalol (NORMODYNE) 200 MG tablet TAKE 2 TABLETS (400 MG TOTAL) BY MOUTH 2 (TWO) TIMES DAILY. 360 tablet 1  . Lancets (ONETOUCH ULTRASOFT)  lancets Use as instructed 100 each 12  . Multiple Vitamin (MULTIVITAMIN) tablet Take 1 tablet by mouth daily.      . nitroGLYCERIN (NITROSTAT) 0.4 MG SL tablet Place 0.4 mg under the tongue every 5 (five) minutes as needed for chest pain. If no relief by 3rd tab, call 911     . omeprazole (PRILOSEC) 40 MG capsule TAKE ONE CAPSULE BY MOUTH DAILY 90 capsule 3  . oxymorphone (OPANA) 5 MG tablet Take 5 mg by mouth every 6 (six) hours as needed for pain (max 3 tablets daily). Per Dr Brien Few    . PARoxetine (PAXIL) 40 MG tablet TAKE ONE TABLET BY MOUTH EVERY MORNING 90 tablet 1  . polyethylene glycol (MIRALAX / GLYCOLAX) packet Take 17 g by mouth daily. 14 each 0  . rosuvastatin (CRESTOR) 5 MG tablet TAKE 1 TABLET BY MOUTH EVERY DAY 90 tablet 0   No facility-administered medications prior to visit.        Past Medical History:  Diagnosis Date  . Arthritis   . CAD in native artery    a. reported MI 2000, 2001, s/p stenting of the circumflex lesion extending into the second obtuse marginal branch with a drug-eluting stent in 2003. b. low risk nuc 2015.  Marland Kitchen Chronic atrial fibrillation   . Chronic combined systolic and diastolic CHF (congestive heart failure) ([place])    a. Previously  diastolic, then EF 05-69% in 10/2016.  . CKD (chronic kidney disease), stage III ([place])   . Depression   . Diabetes mellitus   . Edema of extremities   . Erectile dysfunction   . GERD (gastroesophageal reflux disease)   . Hyperlipidemia   . Hypertension   . Myocardial infarction ([place]) 2000&2001  . Obesity   . Onychomycosis   . OSA (obstructive sleep apnea)   . S/P ICD (internal cardiac defibrillator) procedure and BiV device,  07/25/17 Medtronic  07/26/2017  . Seizures ([place])   . Sleep apnea   . Stroke ([place])   . Tubular adenoma of colon 10/2002        Past Surgical History:  Procedure Laterality Date  . ANGIOPLASTY  2001   stent x 1  . AV NODE ABLATION N/A 08/20/2017   Procedure: AV  NODE ABLATION;  Surgeon: Deboraha Sprang, MD;  Location: [facility];  Service: Cardiovascular;  Laterality: N/A;  . BIV ICD INSERTION CRT-D N/A 07/25/2017   Procedure: BIV ICD INSERTION CRT-D;  Surgeon: Deboraha Sprang, MD;  Location: [facility];  Service: Cardiovascular;  Laterality: N/A;  . COLONOSCOPY  2000?   negative  . FOOT ARTHROTOMY Right   . TOE AMPUTATION Left 2012   Social History        Socioeconomic History  . Marital status: Married    Spouse name: [name]  . Number of children: 2  . Years of education: GED  . Highest education level: Not on file  Occupational History  . Occupation: CUSTOMER SERVICES    Employer: [employer]    Comment: [employer]  Social Needs  . Financial resource strain: Somewhat hard  . Food insecurity    Worry: Not on file    Inability: Not on file  . Transportation needs    Medical: No    Non-medical: No  Tobacco Use  . Smoking status: Light Tobacco Smoker    Packs/day: 0.25    Years: 45.00    Pack years: 11.25    Types: Cigarettes    Start date: 05/28/1967  . Smokeless tobacco: Never Used  Substance and Sexual Activity  . Alcohol use: No    Comment: quit in 1993  . Drug use: No  . Sexual activity: Not on file  Lifestyle  . Physical activity    Days per week: Not on file    Minutes per session: Not on file  . Stress: Not on file  Relationships  . Social Herbalist on phone: Not on file    Gets together: Not on file    Attends religious service: Not on file    Active member of club or organization: Not on file    Attends meetings of clubs or organizations: Not on file    Relationship status: Not on file  Other Topics Concern  . Not on file  Social History Narrative   Patient is right handed, consumes caffeine rarely. Patient resides in home with wife.   Family History  Problem Relation Age of Onset  . Heart attack Mother   . CVA Mother   . Diabetes Mother    . Heart attack Father   . Colon cancer Neg Hx   . Stomach cancer Neg Hx         Physical Exam: General: Well developed, well nourished, no acute distress Head: Normocephalic and atraumatic Eyes:  sclerae anicteric, EOMI Ears: Normal auditory acuity Mouth: No deformity or lesions Lungs:  Clear throughout to auscultation Heart: Regular rate and rhythm; no murmurs, rubs or bruits Abdomen: Soft, non tender and non distended. No masses, hepatosplenomegaly or hernias noted. Normal Bowel sounds Rectal: Deferred to colonoscopy Musculoskeletal: Symmetrical with no gross deformities  Pulses:  Normal pulses noted Extremities: No clubbing, cyanosis, edema or deformities noted Neurological: Alert oriented x 4, grossly nonfocal Psychological:  Alert and cooperative. Normal mood and affect   Assessment and Recommendations:  1. Personal history of adenomatous colon polyps.  He is way overdue for surveillance colonoscopy.  Schedule colonoscopy.  He relates vomiting with his second dose of MoviPrep in 2017 and could not complete the prep.  Suprep with Reglan 10 mg p.o. 30 minutes before both doses. The risks (including bleeding, perforation, infection, missed lesions, medication reactions and possible hospitalization or surgery if complications occur), benefits, and alternatives to colonoscopy with possible biopsy and possible polypectomy were discussed with the patient and they consent to proceed.   2. Hold Eliquis 2 days before procedure - will instruct when and how to resume after procedure. Low but real risk of cardiovascular event such as heart attack, stroke, embolism, thrombosis or ischemia/infarct of other organs off Eliquis explained and need to seek urgent help if this occurs. The patient consents to proceed. Will communicate by phone or EMR with patient's prescribing provider to confirm that holding Eliquis is reasonable in this case.   3. NICM, CHF with EF=30%  4. OSA.   5.  S/P ICD

## 2019-02-08 NOTE — Op Note (Addendum)
[facility] Patient Name: Anthony [name] Procedure Date: 02/08/2019 MRN: [id number] Attending MD: Ladene Artist , MD Date of Birth: [birth year] CSN: [id number] Age: 68 Admit Type: Inpatient Procedure:                Colonoscopy Indications:              Surveillance: Personal history of adenomatous                            polyps on last colonoscopy > 5 years ago Providers:                Pricilla Riffle. Fuller Plan, MD, Cleda Daub, RN, Ladona Ridgel, Technician Referring MD:             Zettie Cooley , MD Medicines:                Monitored Anesthesia Care Complications:            No immediate complications. Estimated blood loss:                            None. Estimated Blood Loss:     Estimated blood loss: none. Procedure:                Pre-Anesthesia Assessment:                           - Prior to the procedure, a History and Physical                            was performed, and patient medications and                            allergies were reviewed. The patient's tolerance of                            previous anesthesia was also reviewed. The risks                            and benefits of the procedure and the sedation                            options and risks were discussed with the patient.                            All questions were answered, and informed consent                            was obtained. Prior Anticoagulants: The patient has                            taken Eliquis (apixaban), last dose was 2 days  prior to procedure. ASA Grade Assessment: III - A                            patient with severe systemic disease. After                            reviewing the risks and benefits, the patient was                            deemed in satisfactory condition to undergo the                            procedure.                           After obtaining informed consent, the colonoscope                        was passed under direct vision. Throughout the                            procedure, the patient's blood pressure, pulse, and                            oxygen saturations were monitored continuously. The                            [id number]) Olympus colonoscope was                            introduced through the anus and advanced to the the                            cecum, identified by appendiceal orifice and                            ileocecal valve. The ileocecal valve, appendiceal                            orifice, and rectum were photographed. The quality                            of the bowel preparation was adequate. The                            colonoscopy was performed without difficulty. The                            patient tolerated the procedure well. Scope In: 8:50:48 AM Scope Out: 9:12:51 AM Scope Withdrawal Time: 0 hours 20 minutes 3 seconds  Total Procedure Duration: 0 hours 22 minutes 3 seconds  Findings:      The perianal and digital rectal examinations were normal.      Six sessile polyps were found in the transverse colon. The polyps were 6  to 8 mm in size. These polyps were removed with a cold snare. Resection       and retrieval were complete.      Two sessile polyps were found in the descending colon. The polyps were 7       to 8 mm in size. These polyps were removed with a cold snare. Resection       and retrieval were complete.      Ten sessile polyps were found in the sigmoid colon. The polyps were 6 to       8 mm in size. These polyps were removed with a cold snare. Resection and       retrieval were complete.      Multiple small-mouthed diverticula were found in the left colon. There       was no evidence of diverticular bleeding.      Internal hemorrhoids were found during retroflexion. The hemorrhoids       were medium-sized and Grade I (internal hemorrhoids that do not       prolapse).      The exam was  otherwise without abnormality on direct and retroflexion       views. Impression:               - Six 6 to 8 mm polyps in the transverse colon,                            removed with a cold snare. Resected and retrieved.                           - Two 7 to 8 mm polyps in the descending colon,                            removed with a cold snare. Resected and retrieved.                           - Ten 6 to 8 mm polyps in the sigmoid colon,                            removed with a cold snare. Resected and retrieved.                           - Mild diverticulosis in the left colon.                           - Internal hemorrhoids.                           - The examination was otherwise normal on direct                            and retroflexion views. Moderate Sedation:      Not Applicable - Patient had care per Anesthesia. Recommendation:           - Repeat colonoscopy after studies are complete for                            surveillance based  on pathology results.                           - Resume Eliquis (apixaban) in 2 days at prior                            dose. Refer to managing physician for further                            adjustment of therapy.                           - Patient has a contact number available for                            emergencies. The signs and symptoms of potential                            delayed complications were discussed with the                            patient. Return to normal activities tomorrow.                            Written discharge instructions were provided to the                            patient.                           - Resume previous diet.                           - Continue present medications.                           - Await pathology results.                           - No aspirin, ibuprofen, naproxen, or other                            non-steroidal anti-inflammatory drugs for 2 weeks                             after polyp removal. Procedure Code(s):        --- Professional ---                           [phone], Colonoscopy, flexible; with removal of                            tumor(s), polyp(s), or other lesion(s) by snare                            technique Diagnosis Code(s):        ---  Professional ---                           Z86.010, Personal history of colonic polyps                           K63.5, Polyp of colon                           K64.0, First degree hemorrhoids                           K57.30, Diverticulosis of large intestine without                            perforation or abscess without bleeding CPT copyright 2019 American Medical Association. All rights reserved. The codes documented in this report are preliminary and upon coder review may  be revised to meet current compliance requirements. Ladene Artist, MD 02/08/2019 9:24:59 AM This report has been signed electronically. Number of Addenda: 0

## 2019-02-08 NOTE — Anesthesia Procedure Notes (Signed)
Procedure Name: MAC Date/Time: 02/08/2019 8:44 AM Performed by: Lissa Morales, CRNA Pre-anesthesia Checklist: Patient identified, Emergency Drugs available, Suction available, Patient being monitored and Timeout performed Patient Re-evaluated:Patient Re-evaluated prior to induction Oxygen Delivery Method: Simple face mask Placement Confirmation: positive ETCO2

## 2019-02-08 NOTE — Transfer of Care (Signed)
Immediate Anesthesia Transfer of Care Note  Patient: Anthony [name]  Procedure(s) Performed: COLONOSCOPY WITH PROPOFOL (N/A ) POLYPECTOMY  Patient Location: PACU  Anesthesia Type:MAC  Level of Consciousness: awake, alert , oriented and patient cooperative  Airway & Oxygen Therapy: Patient Spontanous Breathing and Patient connected to face mask oxygen  Post-op Assessment: Report given to RN, Post -op Vital signs reviewed and stable and Patient moving all extremities X 4  Post vital signs: stable  Last Vitals:  Vitals Value Taken Time  BP 155/88 02/08/19 0922  Temp    Pulse 70 02/08/19 0923  Resp 17 02/08/19 0923  SpO2 100 % 02/08/19 0923  Vitals shown include unvalidated device data.  Last Pain:  Vitals:   02/08/19 0737  TempSrc: Oral         Complications: No apparent anesthesia complications

## 2019-02-08 NOTE — Anesthesia Postprocedure Evaluation (Signed)
Anesthesia Post Note  Patient: Anthony [name]  Procedure(s) Performed: COLONOSCOPY WITH PROPOFOL (N/A ) POLYPECTOMY     Patient location during evaluation: Endoscopy Anesthesia Type: MAC Level of consciousness: awake and alert Pain management: pain level controlled Vital Signs Assessment: post-procedure vital signs reviewed and stable Respiratory status: spontaneous breathing, nonlabored ventilation, respiratory function stable and patient connected to nasal cannula oxygen Cardiovascular status: blood pressure returned to baseline and stable Postop Assessment: no apparent nausea or vomiting Anesthetic complications: no    Last Vitals:  Vitals:   02/08/19 0930 02/08/19 0940  BP: (!) 167/95 (!) 155/89  Pulse: 70 76  Resp: 17 17  Temp:    SpO2: 100% 100%    Last Pain:  Vitals:   02/08/19 0940  TempSrc:   PainSc: 0-No pain                 Koleson Reifsteck DANIEL

## 2019-02-09 ENCOUNTER — Encounter (HOSPITAL_COMMUNITY): Payer: Self-pay | Admitting: Gastroenterology

## 2019-02-09 ENCOUNTER — Encounter: Payer: Self-pay | Admitting: Gastroenterology

## 2019-02-09 NOTE — Telephone Encounter (Signed)
Pt has been taking lasix 40mg  bid since January. He is being followed by Jamal Maes as well for renal. Last BMP in July was noted to have increased Creat by his PCP, however he is seen q44mo by renal. He states Dr. Lorrene Reid is okay with this dose.

## 2019-02-10 ENCOUNTER — Ambulatory Visit (INDEPENDENT_AMBULATORY_CARE_PROVIDER_SITE_OTHER): Payer: HMO | Admitting: *Deleted

## 2019-02-10 DIAGNOSIS — I428 Other cardiomyopathies: Secondary | ICD-10-CM | POA: Diagnosis not present

## 2019-02-11 ENCOUNTER — Ambulatory Visit: Payer: Self-pay | Admitting: Pharmacist

## 2019-02-12 NOTE — Telephone Encounter (Signed)
noted 

## 2019-02-14 LAB — CUP PACEART REMOTE DEVICE CHECK
Battery Remaining Longevity: 23 mo
Battery Voltage: 2.94 V
Brady Statistic AP VP Percent: 0 %
Brady Statistic AP VS Percent: 0 %
Brady Statistic AS VP Percent: 0 %
Brady Statistic AS VS Percent: 0 %
Brady Statistic RA Percent Paced: 0 %
Brady Statistic RV Percent Paced: 98.44 %
Date Time Interrogation Session: 20200920052405
HighPow Impedance: 74 Ohm
Implantable Lead Implant Date: 20190301
Implantable Lead Implant Date: 20190301
Implantable Lead Location: 753858
Implantable Lead Location: 753860
Implantable Pulse Generator Implant Date: 20190301
Lead Channel Impedance Value: 1197 Ohm
Lead Channel Impedance Value: 1197 Ohm
Lead Channel Impedance Value: 1254 Ohm
Lead Channel Impedance Value: 1254 Ohm
Lead Channel Impedance Value: 1254 Ohm
Lead Channel Impedance Value: 1292 Ohm
Lead Channel Impedance Value: 332.5 Ohm
Lead Channel Impedance Value: 341.736
Lead Channel Impedance Value: 341.736
Lead Channel Impedance Value: 354.667
Lead Channel Impedance Value: 365.195
Lead Channel Impedance Value: 4047 Ohm
Lead Channel Impedance Value: 475 Ohm
Lead Channel Impedance Value: 475 Ohm
Lead Channel Impedance Value: 665 Ohm
Lead Channel Impedance Value: 665 Ohm
Lead Channel Impedance Value: 703 Ohm
Lead Channel Impedance Value: 760 Ohm
Lead Channel Pacing Threshold Amplitude: 0.375 V
Lead Channel Pacing Threshold Amplitude: 3 V
Lead Channel Pacing Threshold Pulse Width: 0.4 ms
Lead Channel Pacing Threshold Pulse Width: 1.5 ms
Lead Channel Sensing Intrinsic Amplitude: 5.5 mV
Lead Channel Sensing Intrinsic Amplitude: 5.5 mV
Lead Channel Setting Pacing Amplitude: 2.5 V
Lead Channel Setting Pacing Amplitude: 4 V
Lead Channel Setting Pacing Pulse Width: 0.4 ms
Lead Channel Setting Pacing Pulse Width: 1.5 ms
Lead Channel Setting Sensing Sensitivity: 0.3 mV

## 2019-02-16 ENCOUNTER — Encounter: Payer: Self-pay | Admitting: Cardiology

## 2019-02-16 NOTE — Progress Notes (Signed)
Remote ICD transmission.   

## 2019-02-22 ENCOUNTER — Ambulatory Visit: Payer: Self-pay | Admitting: Pharmacist

## 2019-02-22 ENCOUNTER — Other Ambulatory Visit: Payer: Self-pay | Admitting: *Deleted

## 2019-02-22 MED ORDER — GABAPENTIN 300 MG PO CAPS: 300.0000 mg | ORAL_CAPSULE | Freq: Every day | ORAL | 0 refills | Status: DC

## 2019-02-23 ENCOUNTER — Other Ambulatory Visit: Payer: Self-pay | Admitting: Pharmacy Technician

## 2019-02-23 ENCOUNTER — Other Ambulatory Visit: Payer: Self-pay | Admitting: Pharmacist

## 2019-02-23 NOTE — Patient Outreach (Signed)
[facility]) Care Management  02/23/2019  Anthony [name] 12/03/1950 [id number]   Received in basket message from [place] stating that patient has met OOP spending amount to continue applying for Eliquis. [name] also states that patient has not received his refill for Humalog from [facility].  Faxed updated OOP spend report to [employer] and will follow up in 3-5 business days to check Eliquis application status.  [place] request form to Dr. Vista Lawman for Humalog 75/25 for completion and to be faxed to [place] at [phone].  [name] Ar[place] Certified Pharmacy Technician [place] Management Direct Dial:[phone]

## 2019-02-23 NOTE — Patient Outreach (Signed)
[place] [facility]) Care Management [place]  02/23/2019  Anthony [name] 08/25/1950 [id number]  Reason for referral:HTA C-SNP, medication management/assistance  Incoming voicemail from [name].  Successful return call to patient with HIPAA identifiers verified. Patient is HTA-CSNP patient and [facility] is following him for DM management and PAPs for medications. Patient states he is doing well today.He states his FBGcontinue to be <130and reports compliance with ALL medications. Hismost recent A1c was 8.0 on 12/23/18 (down from 8.2 on 06/23/18). Encouraged patient to continue his progress.Statin last filled on 12/15/18 for #90 days supply.  He denies current or recent illnesses. Reinforced DM healthy diet. Patient verbalizes understanding and appreciative of call.  He states his colonoscopy went well and all findings were benign.    Message sent to [place], Etter Sjogren who is assisting with process BMS/Eliquis application.  Appreciate assistance.   PLAN: -Patient appears stable and capable of managing his disease state. I willcontinue to follow up withhim quarterly.   Regina Eck, PharmD, [place]  [phone]

## 2019-02-26 ENCOUNTER — Other Ambulatory Visit: Payer: Self-pay | Admitting: Pharmacy Technician

## 2019-02-26 NOTE — Patient Outreach (Signed)
[place] [facility]) Care Management  02/26/2019  Anthony [name] 03-19-1951 [id number]    Follow up call placed to [employer] regarding patient assistance application(s) for Eliquis , [place] confirms patient has been approved as of 9/29 until 05/27/19. Medication is in process of being shipped.  Follow up:  Will follow up with patient in 5-7 business days to confirm medication has been recieved.  [name] [place] Certified Pharmacy Technician [place] Management Direct [phone]

## 2019-03-01 ENCOUNTER — Ambulatory Visit: Payer: Self-pay | Admitting: Pharmacist

## 2019-03-05 ENCOUNTER — Other Ambulatory Visit: Payer: Self-pay | Admitting: Pharmacist

## 2019-03-05 NOTE — Patient Outreach (Signed)
[facility]) Care Management [place]  03/05/2019  TREVER STREATER Sep 01, 1950 [id number]  Reason for referral:HTA C-SNP, medication management/assistance  Successful telephone outreach to [name] with HIPAA identifiers verified. Patient is HTA-CSNP patient and [place] is following him for DM management and PAPs for medications. Patient states he is doing well today.His FBGcontinue to be <130and reports compliance with ALL medications. Hismost recent A1c was 8.0on 12/23/18 (down from 8.2 on 06/23/18).  Encouraged patient to continue his progress.Statin last filled on 12/15/18 for #90 days supply.  He denies current or recent illnesses. Reinforced DM healthy diet. Patient verbalizes understanding and appreciative of call.  Patient has received Eliquis from [employer] patient assistance program.  Patient has also received insulin supply for [employer].  Supplies will last patient until the end of the year.  We will help him reapply for next year.  PLAN: -I will follow up with patient towards the end of the year.  Regina Eck, PharmD, [place]  [phone]

## 2019-03-09 ENCOUNTER — Other Ambulatory Visit: Payer: Self-pay

## 2019-03-09 DIAGNOSIS — I739 Peripheral vascular disease, unspecified: Secondary | ICD-10-CM

## 2019-03-10 ENCOUNTER — Other Ambulatory Visit: Payer: Self-pay

## 2019-03-10 NOTE — Patient Outreach (Signed)
  [facility]) Care Management Chronic Special Needs Program    03/10/2019  Name: Anthony [name], DOB: Nov 02, 1950  MRN: [id number]   [name] is enrolled in a chronic special needs plan. RNCM returned call to client. Client with questions about letters he received in the mail regarding health plans and benefits. RNCM provided client the number to health care concierge and to Seniors health insurance information program. Then RNCM completed a warm transfer to the healthcare concierge.  Plan: RNCM to follow up at next scheduled outreach.  Thea Silversmith, RN, MSN, [place] [phone]

## 2019-03-11 ENCOUNTER — Other Ambulatory Visit: Payer: Self-pay | Admitting: Internal Medicine

## 2019-03-11 ENCOUNTER — Ambulatory Visit (HOSPITAL_COMMUNITY)
Admission: RE | Admit: 2019-03-11 | Discharge: 2019-03-11 | Disposition: A | Discharge status: Home / Self Care | Payer: HMO | Source: Ambulatory Visit | Attending: Vascular Surgery | Admitting: Vascular Surgery

## 2019-03-11 ENCOUNTER — Other Ambulatory Visit: Payer: Self-pay

## 2019-03-11 ENCOUNTER — Encounter: Payer: Self-pay | Admitting: Vascular Surgery

## 2019-03-11 ENCOUNTER — Ambulatory Visit (INDEPENDENT_AMBULATORY_CARE_PROVIDER_SITE_OTHER): Payer: HMO | Admitting: Vascular Surgery

## 2019-03-11 VITALS — BP 142/80 | HR 71 | Temp 97.5°F | Resp 20 | Ht 73.0 in | Wt 241.8 lb

## 2019-03-11 DIAGNOSIS — M25562 Pain in left knee: Secondary | ICD-10-CM

## 2019-03-11 DIAGNOSIS — I739 Peripheral vascular disease, unspecified: Secondary | ICD-10-CM | POA: Diagnosis not present

## 2019-03-11 DIAGNOSIS — M25561 Pain in right knee: Secondary | ICD-10-CM | POA: Diagnosis not present

## 2019-03-11 MED ORDER — LABETALOL HCL 200 MG PO TABS: ORAL_TABLET | ORAL | 0 refills | Status: DC

## 2019-03-11 NOTE — Telephone Encounter (Signed)
Pt's medication was sent to pt's pharmacy as requested. Confirmation received.  °

## 2019-03-11 NOTE — Progress Notes (Signed)
Referring Physician: Dorris Singh  Patient name: Anthony [name] MRN: [id number] DOB: 04-17-1951 Sex: male  REASON FOR CONSULT: Leg pain  HPI: Anthony [name] is a 68 y.o. male, with several month history of hip and back pain.  He also gets some pain with walking and standing again this is mainly concentrated in the hips and back.  He does not describe calf claudication.  He does get some numbness and tingling occasionally in his right calf.  He previously had a stroke that affected his right leg.  He says this has mostly recovered but he does occasionally drag his right foot.  He is currently smoking.  Greater than 3 minutes today spent regarding smoking cessation counseling.  He does not have any open wounds.  He does not have rest pain.  Other medical problems include diabetes, coronary artery disease, high blood pressure, hyperlipidemia atrial fibrillation on Eliquis.  All of these are stable.  Past Medical History:  Diagnosis Date  . Arthritis   . CAD in native artery    a. reported MI 2000, 2001, s/p stenting of the circumflex lesion extending into the second obtuse marginal branch with a drug-eluting stent in 2003. b. low risk nuc 2015.  Marland Kitchen Chronic atrial fibrillation ([place])   . Chronic combined systolic and diastolic CHF (congestive heart failure) ([place])    a. Previously diastolic, then EF 81-44% in 10/2016.  . CKD (chronic kidney disease), stage III   . Depression   . Diabetes mellitus   . Edema of extremities   . Erectile dysfunction   . GERD (gastroesophageal reflux disease)   . Hyperlipidemia   . Hypertension   . Myocardial infarction ([place]) 2000&2001  . Obesity   . Onychomycosis   . OSA (obstructive sleep apnea)   . S/P ICD (internal cardiac defibrillator) procedure and BiV device,  07/25/17 Medtronic  07/26/2017  . Seizures ([place])   . Sleep apnea   . Stroke ([place])   . Tubular adenoma of colon 10/2002   Past Surgical History:  Procedure Laterality Date  . ANGIOPLASTY  2001   stent x 1  . AV NODE ABLATION N/A 08/20/2017   Procedure: AV NODE ABLATION;  Surgeon: Deboraha Sprang, MD;  Location: [place] [facility];  Service: Cardiovascular;  Laterality: N/A;  . BIV ICD INSERTION CRT-D N/A 07/25/2017   Procedure: BIV ICD INSERTION CRT-D;  Surgeon: Deboraha Sprang, MD;  Location: [place] [facility];  Service: Cardiovascular;  Laterality: N/A;  . COLONOSCOPY  2000?   negative  . COLONOSCOPY WITH PROPOFOL N/A 02/08/2019   Procedure: COLONOSCOPY WITH PROPOFOL;  Surgeon: Ladene Artist, MD;  [facility];  Service: Endoscopy;  Laterality: N/A;  . FOOT ARTHROTOMY Right   . POLYPECTOMY  02/08/2019   Procedure: POLYPECTOMY;  Surgeon: Ladene Artist, MD;  [facility];  Service: Endoscopy;;  . TOE AMPUTATION Left 2012    Family History  Problem Relation Age of Onset  . Heart attack Mother   . CVA Mother   . Diabetes Mother   . Heart attack Father   . Colon cancer Neg Hx   . Stomach cancer Neg Hx     SOCIAL HISTORY: Social History   Socioeconomic History  . Marital status: Married    Spouse name: [name]  . Number of children: 2  . Years of education: GED  . Highest education level: Not on file  Occupational History  . Occupation: Facilities manager:  [employer]    Comment: [employer]  Social Needs  . Financial resource strain: Somewhat hard  . Food insecurity    Worry: Not on file    Inability: Not on file  . Transportation needs    Medical: No    Non-medical: No  Tobacco Use  . Smoking status: Former Smoker    Packs/day: 0.25    Years: 45.00    Pack years: 11.25    Types: Cigarettes    Start date: 05/28/1967    Quit date: 10/28/2018    Years since quitting: 0.3  . Smokeless tobacco: Never Used  Substance and Sexual Activity  . Alcohol use: No    Comment: quit in 1993  . Drug use: No  . Sexual activity: Not on file  Lifestyle  . Physical activity    Days per week: Not on file    Minutes per session: Not on file  . Stress:  Not on file  Relationships  . Social Herbalist on phone: Not on file    Gets together: Not on file    Attends religious service: Not on file    Active member of club or organization: Not on file    Attends meetings of clubs or organizations: Not on file    Relationship status: Not on file  . Intimate partner violence    Fear of current or ex partner: Not on file    Emotionally abused: Not on file    Physically abused: Not on file    Forced sexual activity: Not on file  Other Topics Concern  . Not on file  Social History Narrative   Patient is right handed, consumes caffeine rarely. Patient resides in home with wife.    Allergies  Allergen Reactions  . Enalapril Maleate Cough    Current Outpatient Medications  Medication Sig Dispense Refill  . apixaban (ELIQUIS) 5 MG TABS tablet Take 1 tablet (5 mg total) by mouth 2 (two) times daily. 180 tablet 1  . B-D ULTRAFINE III SHORT PEN 31G X 8 MM MISC 1 UNITS BY DOES NOT APPLY ROUTE AS NEEDED.  11  . Blood Glucose Monitoring Suppl (ONETOUCH VERIO) w/Device KIT Use kit to check blood sugars. 1 kit 0  . calcitRIOL (ROCALTROL) 0.5 MCG capsule Take 0.5 mcg by mouth daily at 12 noon.     . Dulaglutide (TRULICITY) 6.56 CL/2.7NT SOPN Inject 0.75 mg into the skin every Thursday. In the morning    . furosemide (LASIX) 40 MG tablet Take 1 tablet (40 mg total) by mouth 2 (two) times daily. (Patient taking differently: Take 40 mg by mouth 2 (two) times daily. Morning & midday) 180 tablet 3  . gabapentin (NEURONTIN) 300 MG capsule Take 1 capsule (300 mg total) by mouth at bedtime. 90 capsule 0  . glucose blood (ONETOUCH VERIO) test strip Use as instructed 100 each 12  . hydrALAZINE (APRESOLINE) 25 MG tablet Take 1 tablet (25 mg total) by mouth 3 (three) times daily. 90 tablet 8  . Insulin Isophane & Regular Human (NOVOLIN 70/30 FLEXPEN) (70-30) 100 UNIT/ML PEN Inject 50 Units into the skin daily. 15 mL 11  . KLOR-CON M20 20 MEQ tablet TAKE  1 TABLET BY MOUTH TWICE A DAY (Patient taking differently: Take 20 mEq by mouth 2 (two) times daily. ) 180 tablet 3  . labetalol (NORMODYNE) 200 MG tablet TAKE 2 TABLETS (400 MG TOTAL) BY MOUTH 2 (TWO) TIMES DAILY. Please keep upcoming appt in October with Dr.  Caryl Comes for future refills. Thank you 360 tablet 0  . Lancets (ONETOUCH ULTRASOFT) lancets Use as instructed 100 each 12  . Multiple Vitamin (MULTIVITAMIN) tablet Take 1 tablet by mouth daily.      . nicotine (NICODERM CQ - DOSED IN MG/24 HOURS) 14 mg/24hr patch Place 14 mg onto the skin daily as needed (smoking cessation.).    Marland Kitchen nitroGLYCERIN (NITROSTAT) 0.4 MG SL tablet Place 0.4 mg under the tongue every 5 (five) minutes as needed for chest pain. If no relief by 3rd tab, call 911     . omeprazole (PRILOSEC) 40 MG capsule TAKE ONE CAPSULE BY MOUTH DAILY (Patient taking differently: Take 40 mg by mouth daily before supper. ) 90 capsule 3  . oxymorphone (OPANA) 5 MG tablet Take 5 mg by mouth 3 (three) times daily as needed for pain (max 3 tablets daily). Per Dr Brien Few    . PARoxetine (PAXIL) 40 MG tablet TAKE ONE TABLET BY MOUTH EVERY MORNING (Patient taking differently: Take 40 mg by mouth daily. ) 90 tablet 1  . rosuvastatin (CRESTOR) 5 MG tablet TAKE 1 TABLET BY MOUTH EVERY DAY (Patient taking differently: Take 5 mg by mouth every evening. ) 90 tablet 0   No current facility-administered medications for this visit.     ROS:   General:  No weight loss, Fever, chills  HEENT: No recent headaches, no nasal bleeding, no visual changes, no sore throat  Neurologic: No dizziness, blackouts, seizures. No recent symptoms of stroke or mini- stroke. No recent episodes of slurred speech, or temporary blindness.  Cardiac: No recent episodes of chest pain/pressure, no shortness of breath at rest.  No shortness of breath with exertion.  Denies history of atrial fibrillation or irregular heartbeat  Vascular: No history of rest pain in feet.  No history  of claudication.  No history of non-healing ulcer, No history of DVT   Pulmonary: No home oxygen, no productive cough, no hemoptysis,  No asthma or wheezing  Musculoskeletal:  _0  Arthritis, _1  Low back pain,  _2  Joint pain  Hematologic:No history of hypercoagulable state.  No history of easy bleeding.  No history of anemia  Gastrointestinal: No hematochezia or melena,  No gastroesophageal reflux, no trouble swallowing  Urinary: _3  chronic Kidney disease, _4  on HD - _5  MWF or _6  TTHS, _7  Burning with urination, _8  Frequent urination, _9  Difficulty urinating;   Skin: No rashes  Psychological: No history of anxiety,  No history of depression   Physical Examination  Vitals:   03/11/19 1440  BP: (!) 142/80  Pulse: 71  Resp: 20  Temp: (!) 97.5 F (36.4 C)  SpO2: 99%  Weight: 241 lb 12.8 oz (109.7 kg)  Height: _10  (1.854 m)    Body mass index is 31.9 kg/m.  General:  Alert and oriented, no acute distress HEENT: Normal Neck: No JVD Cardiac: Regular Rate and Rhythm  Abdomen: Soft, non-tender, non-distended, no mass Skin: No rash Extremity Pulses:  2+ radial, brachial, femoral, dorsalis pedis pulses bilaterally Musculoskeletal: No deformity or edema  Neurologic: Upper and lower extremity motor 5/5 and symmetric  DATA:  ABIs right 1.20 biphasic, left 1.22 biphasic toe pressure 101 left 106 right I reviewed and interpreted the study  ASSESSMENT: Patient symptoms seem more consistent with back pain of a musculoskeletal origin rather than peripheral arterial disease.  He has palpable pulses in his feet with normal ABIs   PLAN: Patient was advised to  quit smoking.  He will start a walking program of 30 minutes daily.  He will follow-up with me on as-needed basis.   Ruta Hinds, MD [facility] Office: [phone] Pager: [phone]

## 2019-03-16 ENCOUNTER — Other Ambulatory Visit: Payer: Self-pay | Admitting: *Deleted

## 2019-03-16 MED ORDER — GABAPENTIN 300 MG PO CAPS: 300.0000 mg | ORAL_CAPSULE | Freq: Every day | ORAL | 0 refills | Status: DC

## 2019-03-16 MED ORDER — BD PEN NEEDLE SHORT U/F 31G X 8 MM MISC: 11 refills | Status: DC

## 2019-03-16 NOTE — Addendum Note (Signed)
Addended by: Christen Bame D on: 03/16/2019 10:59 AM   Modules accepted: Orders

## 2019-03-16 NOTE — Telephone Encounter (Signed)
Pt never picked up script from [facility] (he does not use that pharmacy).  Resent to [facility] as requested. Christen Bame, CMA

## 2019-03-19 ENCOUNTER — Encounter: Payer: Self-pay | Admitting: Internal Medicine

## 2019-03-19 ENCOUNTER — Other Ambulatory Visit: Payer: Self-pay

## 2019-03-19 ENCOUNTER — Ambulatory Visit (INDEPENDENT_AMBULATORY_CARE_PROVIDER_SITE_OTHER): Payer: HMO | Admitting: Internal Medicine

## 2019-03-19 VITALS — BP 156/88 | HR 75 | Ht 73.0 in | Wt 242.2 lb

## 2019-03-19 DIAGNOSIS — I5022 Chronic systolic (congestive) heart failure: Secondary | ICD-10-CM | POA: Diagnosis not present

## 2019-03-19 DIAGNOSIS — I4821 Permanent atrial fibrillation: Secondary | ICD-10-CM | POA: Diagnosis not present

## 2019-03-19 DIAGNOSIS — Z95810 Presence of automatic (implantable) cardiac defibrillator: Secondary | ICD-10-CM

## 2019-03-19 DIAGNOSIS — I10 Essential (primary) hypertension: Secondary | ICD-10-CM

## 2019-03-19 DIAGNOSIS — I428 Other cardiomyopathies: Secondary | ICD-10-CM

## 2019-03-19 MED ORDER — FUROSEMIDE 40 MG PO TABS: 40.0000 mg | ORAL_TABLET | Freq: Every day | ORAL | 3 refills | Status: DC

## 2019-03-19 NOTE — Patient Instructions (Signed)
Medication Instructions:   Begin taking your lasix, 80mg , two tablets, once daily.   Labwork: None ordered.  Testing/Procedures: None ordered.  Follow-Up: Your physician recommends that you schedule a follow-up appointment in:   12 months with Dr. Caryl Comes  Any Other Special Instructions Will Be Listed Below (If Applicable).     If you need a refill on your cardiac medications before your next appointment, please call your pharmacy.

## 2019-03-19 NOTE — Progress Notes (Signed)
Patient Care Team: [name], MD as PCP - General (Family Medicine) Dorothy Spark, MD as PCP - Cardiology (Cardiology) Luretha Rued, RN as [place] Management [place], Royce Macadamia, [place] as [place] Management (Pharmacist) Marlaine Hind, MD as Consulting Physician (Physical Medicine and Rehabilitation) Jamal Maes, MD as Consulting Physician (Nephrology)   HPI  Anthony [name] is a 68 y.o. male Seen for atrial fibrillation with a poorly controlled ventricular response now s/p AV ablation.3/19  Is a history of ischemic heart disease with stenting 2003.  He was noted to have low voltage and left ventricular hypertrophy and the issue was raised as to whether he might have transthyretin amyloid technetium; pyrophosphate imaging was notable for only grade 1 findings.  He has not undergone electrophoresis testing.  DATE TEST EF   7/15  Myoview    No ischemia  9/15  Echo    55-60 %   6/18 Echo  25-30% Severe LAE   8/19 Echo  55-65     Date Cr K Mg Hgb  10/17 1.55 3.4  13.7  7/18 1.82  1.5  13.8  9/18 2.2 4.5  13.5  3/19 2.09 3.3    4/19 2.5 5.3    7.20 2.94 4.9  13.1   He continues to do well with only modest sob and mild edema No chest pain or palps     Past Medical History:  Diagnosis Date  . Arthritis   . CAD in native artery    a. reported MI 2000, 2001, s/p stenting of the circumflex lesion extending into the second obtuse marginal branch with a drug-eluting stent in 2003. b. low risk nuc 2015.  Marland Kitchen Chronic atrial fibrillation ([place])   . Chronic combined systolic and diastolic CHF (congestive heart failure) ([place])    a. Previously diastolic, then EF 47-82% in 10/2016.  . CKD (chronic kidney disease), stage III   . Depression   . Diabetes mellitus   . Edema of extremities   . Erectile dysfunction   . GERD (gastroesophageal reflux disease)   . Hyperlipidemia   . Hypertension   . Myocardial infarction  ([place]) 2000&2001  . Obesity   . Onychomycosis   . OSA (obstructive sleep apnea)   . S/P ICD (internal cardiac defibrillator) procedure and BiV device,  07/25/17 Medtronic  07/26/2017  . Seizures ([place])   . Sleep apnea   . Stroke ([place])   . Tubular adenoma of colon 10/2002    Past Surgical History:  Procedure Laterality Date  . ANGIOPLASTY  2001   stent x 1  . AV NODE ABLATION N/A 08/20/2017   Procedure: AV NODE ABLATION;  Surgeon: Deboraha Sprang, MD;  Location: [place] [facility];  Service: Cardiovascular;  Laterality: N/A;  . BIV ICD INSERTION CRT-D N/A 07/25/2017   Procedure: BIV ICD INSERTION CRT-D;  Surgeon: Deboraha Sprang, MD;  Location: [place] [facility];  Service: Cardiovascular;  Laterality: N/A;  . COLONOSCOPY  2000?   negative  . COLONOSCOPY WITH PROPOFOL N/A 02/08/2019   Procedure: COLONOSCOPY WITH PROPOFOL;  Surgeon: Ladene Artist, MD;  [facility];  Service: Endoscopy;  Laterality: N/A;  . FOOT ARTHROTOMY Right   . POLYPECTOMY  02/08/2019   Procedure: POLYPECTOMY;  Surgeon: Ladene Artist, MD;  [facility];  Service: Endoscopy;;  . TOE AMPUTATION Left 2012    Current Outpatient Medications  Medication Sig Dispense Refill  . apixaban (  ELIQUIS) 5 MG TABS tablet Take 1 tablet (5 mg total) by mouth 2 (two) times daily. 180 tablet 1  . Blood Glucose Monitoring Suppl (ONETOUCH VERIO) w/Device KIT Use kit to check blood sugars. 1 kit 0  . calcitRIOL (ROCALTROL) 0.5 MCG capsule Take 0.5 mcg by mouth daily at 12 noon.     . Dulaglutide (TRULICITY) 3.81 RR/1.1AF SOPN Inject 0.75 mg into the skin every Thursday. In the morning    . furosemide (LASIX) 40 MG tablet Take 1 tablet (40 mg total) by mouth 2 (two) times daily. 180 tablet 3  . gabapentin (NEURONTIN) 300 MG capsule Take 1 capsule (300 mg total) by mouth at bedtime. 90 capsule 0  . glucose blood (ONETOUCH VERIO) test strip Use as instructed 100 each 12  . hydrALAZINE (APRESOLINE) 25 MG tablet Take 1  tablet (25 mg total) by mouth 3 (three) times daily. 90 tablet 8  . Insulin Isophane & Regular Human (NOVOLIN 70/30 FLEXPEN) (70-30) 100 UNIT/ML PEN Inject 50 Units into the skin daily. 15 mL 11  . Insulin Pen Needle (B-D ULTRAFINE III SHORT PEN) 31G X 8 MM MISC 1 UNITS BY DOES NOT APPLY ROUTE AS NEEDED. 100 each 11  . KLOR-CON M20 20 MEQ tablet TAKE 1 TABLET BY MOUTH TWICE A DAY 180 tablet 3  . labetalol (NORMODYNE) 200 MG tablet TAKE 2 TABLETS (400 MG TOTAL) BY MOUTH 2 (TWO) TIMES DAILY. Please keep upcoming appt in October with Dr. Caryl Comes for future refills. Thank you 360 tablet 0  . Lancets (ONETOUCH ULTRASOFT) lancets Use as instructed 100 each 12  . Multiple Vitamin (MULTIVITAMIN) tablet Take 1 tablet by mouth daily.      . nitroGLYCERIN (NITROSTAT) 0.4 MG SL tablet Place 0.4 mg under the tongue every 5 (five) minutes as needed for chest pain. If no relief by 3rd tab, call 911     . omeprazole (PRILOSEC) 40 MG capsule TAKE ONE CAPSULE BY MOUTH DAILY (Patient taking differently: Take 40 mg by mouth daily before supper. ) 90 capsule 3  . oxymorphone (OPANA) 5 MG tablet Take 5 mg by mouth 3 (three) times daily as needed for pain (max 3 tablets daily). Per Dr Brien Few    . PARoxetine (PAXIL) 40 MG tablet TAKE ONE TABLET BY MOUTH EVERY MORNING 90 tablet 1  . rosuvastatin (CRESTOR) 5 MG tablet TAKE 1 TABLET BY MOUTH EVERY DAY 90 tablet 0   No current facility-administered medications for this visit.     Allergies  Allergen Reactions  . Enalapril Maleate Cough      Review of Systems negative except from HPI and PMH  Physical Exam BP (!) 156/88   Pulse 75   Ht '6\' 1"'  (1.854 m)   Wt 242 lb 3.2 oz (109.9 kg)   SpO2 98%   BMI 31.95 kg/m  Well developed and well nourished in no acute distress HENT normal Neck supple with JVP-8+ HJR Clear Device pocket well healed; without hematoma or erythema.  There is no tethering  Regular rate and rhythm, no  gallop No  murmur Abd-soft with active BS  No Clubbing cyanosis 1+ edema Skin-warm and dry A & Oriented  Grossly normal sensory and motor function  ECG afib with CHB and      ECG  Atrial fib Vpacing @ 73 -/15/47 QRS neg 1; rS V1  Assessment and  Plan  Atrial fibrillation-permanent rapid ventricular response  AV ablation   LV threshold elevated   Low-voltage limb leads and  severe concentric LVH concerning for amyloid   Congestive heart failure-chronic-systolic-class IIb    Hypertension  Renal insufficiency grade 4  Presyncope  Nonischemic cardiomyopathy-new  Implantable defibrillator Medtronic The patient's device was interrogated and the information was fully reviewed.  The device was reprogrammed to reduce LV capture margin   Volume overloaded  Will have him take diuretics 80 qam not bid  Elevated threshold on LV lead with an impact on LV   On Anticoagulation;  No bleeding issues   BP still elevated will defer to renal

## 2019-03-22 LAB — CUP PACEART INCLINIC DEVICE CHECK
Battery Remaining Longevity: 26 mo
Battery Voltage: 2.92 V
Brady Statistic AP VP Percent: 0 %
Brady Statistic AP VS Percent: 0 %
Brady Statistic AS VP Percent: 0 %
Brady Statistic AS VS Percent: 0 %
Brady Statistic RA Percent Paced: 0 %
Brady Statistic RV Percent Paced: 97.66 %
Date Time Interrogation Session: 20201023162734
HighPow Impedance: 77 Ohm
Implantable Lead Implant Date: 20190301
Implantable Lead Implant Date: 20190301
Implantable Lead Location: 753858
Implantable Lead Location: 753860
Implantable Pulse Generator Implant Date: 20190301
Lead Channel Impedance Value: 1197 Ohm
Lead Channel Impedance Value: 1197 Ohm
Lead Channel Impedance Value: 1235 Ohm
Lead Channel Impedance Value: 1235 Ohm
Lead Channel Impedance Value: 1235 Ohm
Lead Channel Impedance Value: 1235 Ohm
Lead Channel Impedance Value: 332.5 Ohm
Lead Channel Impedance Value: 346.164
Lead Channel Impedance Value: 346.164
Lead Channel Impedance Value: 346.164
Lead Channel Impedance Value: 346.164
Lead Channel Impedance Value: 4047 Ohm
Lead Channel Impedance Value: 456 Ohm
Lead Channel Impedance Value: 551 Ohm
Lead Channel Impedance Value: 665 Ohm
Lead Channel Impedance Value: 665 Ohm
Lead Channel Impedance Value: 722 Ohm
Lead Channel Impedance Value: 722 Ohm
Lead Channel Pacing Threshold Amplitude: 0.5 V
Lead Channel Pacing Threshold Amplitude: 2.75 V
Lead Channel Pacing Threshold Pulse Width: 0.4 ms
Lead Channel Pacing Threshold Pulse Width: 1.5 ms
Lead Channel Sensing Intrinsic Amplitude: 5.5 mV
Lead Channel Setting Pacing Amplitude: 2.5 V
Lead Channel Setting Pacing Amplitude: 3.25 V
Lead Channel Setting Pacing Pulse Width: 0.4 ms
Lead Channel Setting Pacing Pulse Width: 1.5 ms
Lead Channel Setting Sensing Sensitivity: 0.3 mV

## 2019-03-26 ENCOUNTER — Other Ambulatory Visit: Payer: Self-pay

## 2019-03-26 MED ORDER — PAROXETINE HCL 40 MG PO TABS: 40.0000 mg | ORAL_TABLET | Freq: Every morning | ORAL | 1 refills | Status: DC

## 2019-04-06 ENCOUNTER — Telehealth: Payer: Self-pay | Admitting: Internal Medicine

## 2019-04-06 DIAGNOSIS — I5022 Chronic systolic (congestive) heart failure: Secondary | ICD-10-CM

## 2019-04-06 NOTE — Telephone Encounter (Signed)
  Pt c/o Shortness Of Breath: STAT if SOB developed within the last 24 hours or pt is noticeably SOB on the phone  1. Are you currently SOB (can you hear that pt is SOB on the phone)? no  2. How long have you been experiencing SOB? ~3 weeks  3. Are you SOB when sitting or when up moving around? Up and moving around, when he first wakes up in the morning  4. Are you currently experiencing any other symptoms? Weakness, dizziness  Patient wanted to go clean the gutters on his house, but was just too SOB to do so. He thinks some of his medication may need to be adjusted. Pt sat for about 40 min prior to the phone call and SOB settled down

## 2019-04-06 NOTE — Telephone Encounter (Signed)
Patient reports SOB with exertion that has been going on for about 3 weeks. He is unable to walk as far as he used to and was unable to clean his gutters this morning due to this. He also reports some weakness and dizziness. Patient feels much better at rest. He is taking Lasix 80 mg per day. He denies chest pain, weight gain, or swelling. He states that he feels similar to how he did prior to placement of his defibrillator in 2019. Patient saw Dr. Caryl Comes on 10/23 and reported same symptoms however it has gotten worse since then.

## 2019-04-07 ENCOUNTER — Other Ambulatory Visit: Payer: Self-pay

## 2019-04-07 NOTE — Patient Outreach (Signed)
  [facility]) Care Management Chronic Special Needs Program    04/07/2019  Name: Anthony [name], DOB: [birth year]  MRN: [id number]   [name] is enrolled in a chronic special needs plan.  Per House calls nurse/[employer]-client is unable to pay for prescriptions.  [facility] pharmacy actively involved-client received Eliquis from patient assistance program; received insulin supply that will last until the end of the year. [facility] pharmacy continues to be actively involved for assistance for next year as well. No additional interventions at this time.  Plan: continue care coordination as needed. RNCM will continue to follow.  Thea Silversmith, RN, MSN, [place] [phone]

## 2019-04-08 ENCOUNTER — Other Ambulatory Visit: Payer: Self-pay

## 2019-04-08 NOTE — Patient Outreach (Addendum)
  [facility]) Care Management Chronic Special Needs Program    04/08/2019  Name: Anthony [name], DOB: 28-Jan-1951  MRN: [id number]   Anthony [name] is enrolled in a chronic special needs plan for Diabetes. RNCM returned call to client who has benefits question regarding Medicare and Insurance.   RNCM confirmed client has concierge contact number and completed warm transfer to health care concierge.  Thea Silversmith, RN, MSN, [place] [phone]

## 2019-04-09 NOTE — Telephone Encounter (Signed)
He will need a bmet  Could you also ask him if he has a renal doctor

## 2019-04-09 NOTE — Telephone Encounter (Signed)
Patient reports that his SOB has gotten a little bit better but still has trouble when walking up stairs.  He reports that he does a nephrologist but it has been a few months since he has seen them.   He is coming in 11/18 for BMET.

## 2019-04-09 NOTE — Addendum Note (Signed)
Addended by: Antonieta Iba on: 04/09/2019 03:43 PM   Modules accepted: Orders

## 2019-04-12 ENCOUNTER — Other Ambulatory Visit: Payer: Self-pay | Admitting: Cardiology

## 2019-04-12 ENCOUNTER — Other Ambulatory Visit: Payer: Self-pay | Admitting: Internal Medicine

## 2019-04-12 NOTE — Telephone Encounter (Signed)
Eliquis 5mg  refill request received and last filled 10/29/2018. Pt is 68yrs old, weight-109.9kg, Crea-2.94 on 12/23/2018, Diagnosis-Afib, and last seen by Dr. Caryl Comes on 03/19/2019. Dose is appropriate based on dosing criteria. Will send in refill to requested pharmacy.

## 2019-04-14 ENCOUNTER — Other Ambulatory Visit: Payer: HMO | Admitting: *Deleted

## 2019-04-14 ENCOUNTER — Other Ambulatory Visit: Payer: Self-pay

## 2019-04-15 LAB — BASIC METABOLIC PANEL
BUN/Creatinine Ratio: 7 — ABNORMAL LOW (ref 10–24)
BUN: 19 mg/dL (ref 8–27)
CO2: 23 mmol/L (ref 20–29)
Calcium: 9.2 mg/dL (ref 8.6–10.2)
Chloride: 101 mmol/L (ref 96–106)
Creatinine, Ser: 2.92 mg/dL — ABNORMAL HIGH (ref 0.76–1.27)
GFR calc Af Amer: 25 mL/min/{1.73_m2} — ABNORMAL LOW (ref >59)
GFR calc non Af Amer: 21 mL/min/{1.73_m2} — ABNORMAL LOW (ref >59)
Glucose: 161 mg/dL — ABNORMAL HIGH (ref 65–99)
Potassium: 4.1 mmol/L (ref 3.5–5.2)
Sodium: 139 mmol/L (ref 134–144)

## 2019-04-19 ENCOUNTER — Ambulatory Visit: Payer: Self-pay | Admitting: Pharmacist

## 2019-04-20 ENCOUNTER — Other Ambulatory Visit: Payer: Self-pay | Admitting: *Deleted

## 2019-04-20 MED ORDER — OMEPRAZOLE 40 MG PO CPDR: 40.0000 mg | DELAYED_RELEASE_CAPSULE | Freq: Every day | ORAL | 0 refills | Status: DC

## 2019-04-21 ENCOUNTER — Telehealth: Payer: Self-pay | Admitting: Internal Medicine

## 2019-04-21 ENCOUNTER — Encounter: Payer: Self-pay | Admitting: Internal Medicine

## 2019-04-21 NOTE — Telephone Encounter (Signed)
I spoke with the patient. He is aware of his lab results.  I inquired how he is currently doing with his breathing and if he is having any edema. Per the patient, he feels he is doing really well at this time. He took a course of OTC Osteo Bi-flex for arthritis and felt like this also helped his issue.  He confirms he is currently on lasix 40 mg- taking 2 tablets (80 mg) once daily. He feels this is working for him.  I advised the patient if he feels like he is having any increased SOB/ swelling/ weight gain, to please call us back.  The patient voices understanding of the above and is agreeable.

## 2019-04-21 NOTE — Telephone Encounter (Signed)
Notes recorded by Deboraha Sprang, MD on 04/20/2019 at 8:26 AM EST  Please Inform Patient   Labs are normal x stable kidney problems   Thanks

## 2019-04-21 NOTE — Telephone Encounter (Signed)
Reviewed the patient's chart late yesterday prior to calling results.  I asked Dr. Caryl Comes to please review phone notes from 04/06/19 as I was unsure if the patient's diuretics needed to be adjusted.  CC'ed chart message received from Dr. Caryl Comes today- see "documentation" note in the patient's chart from Dr. Caryl Comes.   Dr. Caryl Comes attempted to call the patient today.  No answer- LM to call.   Will initiate a phone note as well.

## 2019-04-21 NOTE — Telephone Encounter (Signed)
Patient returning call.

## 2019-04-21 NOTE — Telephone Encounter (Signed)
Editor: Deboraha Sprang, MD (Physician)    Treid to call pt but got VM and left a message  Renal function is stable, the question is how is he faring with diuresis, ie  Does the 80 mg work or not,  If urinating is brisk with 80 and his shortness of breath persists and he still has edema 80 bid x qod x 4 doses  If urinating is not brisk with 80 then increase to 120 x 3d If urinating is brisk with 80 and his SOB persists and he has no edema, then 80 bid x 3 d Thanks SK

## 2019-04-21 NOTE — Telephone Encounter (Signed)
Is Carly short for [place] ?

## 2019-04-21 NOTE — Progress Notes (Signed)
Treid to call pt but got VM and left a message  Renal function is stable, the question is how is he faring with diuresis, ie  Does the 80 mg work or not,  If urinating is brisk with 80 and his shortness of breath persists and he still has edema 80 bid x qod x 4 doses  If urinating is not brisk with 80 then increase to 120 x 3d If urinating is brisk with 80 and his SOB persists and he has no edema, then 80 bid x 3 d Thanks SK

## 2019-05-05 ENCOUNTER — Ambulatory Visit: Payer: BC Managed Care – PPO | Admitting: Podiatry

## 2019-05-07 ENCOUNTER — Telehealth: Payer: Self-pay | Admitting: *Deleted

## 2019-05-07 NOTE — Telephone Encounter (Signed)
Patient wants let let provider know that we will receive a fax from a "company that starts with a Q" for DM shoes and a meter.   He is authorizing this and would like for [place] to complete it. Christen Bame, CMA

## 2019-05-10 ENCOUNTER — Ambulatory Visit (INDEPENDENT_AMBULATORY_CARE_PROVIDER_SITE_OTHER): Payer: HMO | Admitting: Family Medicine

## 2019-05-10 ENCOUNTER — Other Ambulatory Visit: Payer: Self-pay

## 2019-05-10 ENCOUNTER — Encounter: Payer: Self-pay | Admitting: Family Medicine

## 2019-05-10 VITALS — BP 148/82 | HR 87 | Ht 73.0 in | Wt 242.0 lb

## 2019-05-10 DIAGNOSIS — I10 Essential (primary) hypertension: Secondary | ICD-10-CM | POA: Diagnosis not present

## 2019-05-10 DIAGNOSIS — Z794 Long term (current) use of insulin: Secondary | ICD-10-CM

## 2019-05-10 DIAGNOSIS — N184 Chronic kidney disease, stage 4 (severe): Secondary | ICD-10-CM | POA: Diagnosis not present

## 2019-05-10 DIAGNOSIS — M65342 Trigger finger, left ring finger: Secondary | ICD-10-CM

## 2019-05-10 DIAGNOSIS — E119 Type 2 diabetes mellitus without complications: Secondary | ICD-10-CM | POA: Diagnosis not present

## 2019-05-10 LAB — POCT GLYCOSYLATED HEMOGLOBIN (HGB A1C): HbA1c, POC (controlled diabetic range): 7.8 % — AB (ref 0.0–7.0)

## 2019-05-10 NOTE — Progress Notes (Signed)
Subjective  CC: Diabetes  [id number] Anthony [name] is a 68 y.o. male who presents today with the following problems:  Diabetes Diabetes Mellitus Type II, Follow-up: Patient here for follow-up of Type 2 diabetes mellitus.   Known diabetic complications: retinopathy, cardiovascular disease and peripheral vascular disease Cardiovascular risk factors: advanced age (older than 4 for men, 64 for women), dyslipidemia, hypertension, male gender, microalbuminuria, obesity (BMI >= 30 kg/m2) and sedentary lifestyle Current diabetic medications include humulin 50 units daily (morning), dulaglutide 0.75 mg, crestor 5 mg.   Eye exam current (within one year): February 2021  Weight trend: increasing steadily Prior visit with dietician: no Current diet: in general, a "healthy" diet  , on average, 2 + snacks meals per day, on average, 0 fast food meals per week, on average, 1 servings fruit per day, on average, at least one servings vegetables per day Current exercise: walking - 10-15 minutes of walking. About as long as he can handle. Back pain limits walking.   Current monitoring regimen: none Home blood sugar records: fasting range: 125-130 Any episodes of hypoglycemia? no  Is He on ACE inhibitor or angiotensin II receptor blocker?  No - enalipril allergy  HTN: BP 148/82 today. Patient reports that she does not take his blood pressures at home, but does has the ability to. He is currently taking labetolol, hydralazine 25 mg TID, lasix 40 mg. He is followed by cardiology for his pacemaker.   CKD IV  Has appointment with kidney doctor at the end of the month.   Right 4th finger pain  Patient reports pain x 1 month. He reports difficulty straightening his fourth finger when opening his hand. He reports that he has to manually open the finger most times. He denies anything that makes the pain better or worse.   Pertinent PM/FHx: CVA, systolic heart failure, CKD stage III, status post ICD placement,  A. fib, venous insufficiency, GERD, diabetic retinopathy Social Hx: Former smoker . Denies ETOH & drug use.  ROS: Pertinent ROS included in HPI. Objective  Physical Exam:  BP (!) 148/82   Pulse 87   Ht '6\' 1"'$  (1.854 m)   Wt 242 lb (109.8 kg)   SpO2 100%   BMI 31.93 kg/m  General: Well-appearing male in no acute distress Diabetic foot exam: Upon inspection there are no lesions, rash, macerated areas between toes, bony deformities, bunions. DPs are 2+ bilaterally.  There is no loss of sensation to monofilament touch or pinprick sensation bilaterally at sites at high risk for ulceration. Patient has a normal gait and joint mobility.  Patient has preserved great toe proprioception bilaterally.  Pertinent Labs:  Hemoglobin A1c 7.8 Assessment & Plan    Problem List Items Addressed This Visit      Active Problems   Insulin dependent type 2 diabetes mellitus (HCC)    A1c is 7.8 today and within goal range.  No changes in medication today.  We will continue Novolin 70/30 50 units daily and Trulicity [id number] mg q. Thursday      Relevant Orders   HgB A1c (Completed)   Ambulatory referral to Chronic Care Management Services   HYPERTENSION, BENIGN SYSTEMIC    Patient's hypertension is managed by Dr. Caryl Comes as as he is the one feeling patient's medications.  Patient would benefit from ACE/ARB (allergy to enalipril in the past: cough).  Would ideally start losartan; however, given patient's worsening kidney function, will hold for now and follow-up with patient after [facility] appointment.  Current medication includes labetalol 400 mg twice daily and hydralazine 25 mg 3 times daily and Lasix 80 mg daily      Relevant Orders   Ambulatory referral to Chronic Care Management Services   Chronic kidney disease (CKD), stage IV (severe) ([place]) - Primary    Patient has follow-up with [facility] within the next month.  I have also placed a referral to chronic care management  for patient's medications to optimize hypertension regimen and for renal dosing.      Trigger finger, left ring finger    Patient counseled on therapies for trigger finger.  He would like to avoid injection at this time and continue conservative management.         [name], M.D.  4:53 PM 05/16/2019

## 2019-05-10 NOTE — Patient Instructions (Addendum)
Dear Anthony [name],   It was good to see you! Thank you for taking your time to come in to be seen. Today, we discussed the following:   Diabetes  You are well controlled! Great job on managing your diabetes!   High Blood Pressure   Your pressures have been a little bit high. I would like to call your other doctors to get you on the best regimen possible   Please write down your blood pressures in the morning before eating your drinking. Please write them down and bring the paper back at your next visit.   Chronic Kidney Disease  . I will be looking closely at your chart to review your medications given your declining kidney function. I will call you with any updates.   Gabapentin   Decrease to 1 capsule every other day for one week.   Then twice a week   And then stop taking the gabapentin   If you experience any side effects, dizziness, nausea, vomiting, please call us or go to the nearest ED   Please follow up in 1 month for high blood pressure check or sooner for concerning or worsening symptoms.   Be well,   Zettie Cooley, M.D   [facility] [phone]  *Sign up for MyChart for instant access to your health profile, labs, orders, upcoming appointments or to contact your provider with questions*  ===================================================================================   Medication List:   High Blood pressure Hydralazine 25 mg 3 times a day Labetalol 400 mg twice daily Potassium 20 mg daily   Diabetes Dulaglutide 0.75 mg weekly Insulin 70/30-50 units daily Rosuvastatin 5 mg daily  Chronic back pain Oxymorphone 5 mg 3 times daily as needed  Persistent atrial fibrillation Eliquis 5 mg daily   DECREASE GABAPENTIN

## 2019-05-11 LAB — BASIC METABOLIC PANEL
BUN/Creatinine Ratio: 5 — ABNORMAL LOW (ref 10–24)
BUN: 15 mg/dL (ref 8–27)
CO2: 25 mmol/L (ref 20–29)
Calcium: 9.6 mg/dL (ref 8.6–10.2)
Chloride: 103 mmol/L (ref 96–106)
Creatinine, Ser: 2.89 mg/dL — ABNORMAL HIGH (ref 0.76–1.27)
GFR calc Af Amer: 25 mL/min/{1.73_m2} — ABNORMAL LOW (ref >59)
GFR calc non Af Amer: 21 mL/min/{1.73_m2} — ABNORMAL LOW (ref >59)
Glucose: 58 mg/dL — ABNORMAL LOW (ref 65–99)
Potassium: 3.9 mmol/L (ref 3.5–5.2)
Sodium: 141 mmol/L (ref 134–144)

## 2019-05-12 ENCOUNTER — Other Ambulatory Visit: Payer: Self-pay

## 2019-05-12 ENCOUNTER — Ambulatory Visit (INDEPENDENT_AMBULATORY_CARE_PROVIDER_SITE_OTHER): Payer: HMO | Admitting: *Deleted

## 2019-05-12 DIAGNOSIS — I5022 Chronic systolic (congestive) heart failure: Secondary | ICD-10-CM | POA: Diagnosis not present

## 2019-05-12 NOTE — Telephone Encounter (Signed)
Pt calling to see if we received the form.  Form is not in PCP box.  Dr. [name],  Have you seen this form. Christen Bame, CMA

## 2019-05-12 NOTE — Patient Outreach (Signed)
  [facility]) Care Management Chronic Special Needs Program  05/12/2019  Name: Anthony [name] DOB: 1951-02-10  MRN: [id number]  Mr. Garrit Marrow is enrolled in a chronic special needs plan for Heart Failure. Reviewed and updated care plan.  Subjective: client reports he is doing well. He states he has attended his provider visits and is able to get his medications without any difficulty. Cleint states he does not weigh routinely. Client states, "I feel good". Client denies any issues or concerns at this time.   Goals Addressed            This Visit's Progress   . COMPLETED: Client understands the importance of follow-up with providers by attending scheduled visits       voiced the importance of attending provider visits.    . COMPLETED: Client verbalize knowledge of Heart Failure disease self management skills within the next 6 months.       Voiced taking medications as prescribed, attending provider visits, signs/symptoms of exacerbations and knowing when to call, voiced knowledge of importance of daily weights.     . COMPLETED: Client will use Assistive Devices as needed and verbalize understanding of device use       Denies any difficulty with use of glucometer.     . Client will verbalize knowledge of self management of Hypertension as evidences by BP reading of 140/90 or less; or as defined by provider   On track    Please review educational material regarding taking blood pressure.    . COMPLETED: Maintain timely refills of Heart Failure medication as prescribed within the year        Client denies any difficulty obtaining medications.    . Obtain annual  Lipid Profile, LDL-C   On track   . Obtain Annual Eye (retinal)  Exam    On track   . COMPLETED: Obtain Annual Foot Exam       Client reports done    . Obtain annual screen for micro albuminuria (urine) , nephropathy (kidney problems)   On track   . Quit Smoking (pt-stated)   On track    He is wanting  to quit completely. He is weaning down. Compares this to when he quit alcohol in 1993.  Discuss with provider smoking cessation options. He reports he is down to about 5 cigarettes/day.    . COMPLETED: Visit Primary Care Provider or Cardiologist at least 2 times per year       Have seen provider twice this year.      Discussed the importance of weights; signs/symptoms of heart failure exacerbation. Reinforced importance of monitoring salt and reviewed foods that are high in sodium.  Covid precautions discussed; RNCM encouraged client to call health concierge for benefits questions as needed. Encouraged client to call RNCM as needed.    Plan: RNCM will send updated care plan to client; send updated care plan to primary care. RNCM will follow up per tier level within the next 6 months.   Thea Silversmith, RN, MSN, [facility] [phone]    .

## 2019-05-13 LAB — CUP PACEART REMOTE DEVICE CHECK
Battery Remaining Longevity: 24 mo
Battery Voltage: 2.94 V
Brady Statistic AP VP Percent: 0 %
Brady Statistic AP VS Percent: 0 %
Brady Statistic AS VP Percent: 0 %
Brady Statistic AS VS Percent: 0 %
Brady Statistic RA Percent Paced: 0 %
Brady Statistic RV Percent Paced: 99.57 %
Date Time Interrogation Session: 20201216044224
HighPow Impedance: 70 Ohm
Implantable Lead Implant Date: 20190301
Implantable Lead Implant Date: 20190301
Implantable Lead Location: 753858
Implantable Lead Location: 753860
Implantable Pulse Generator Implant Date: 20190301
Lead Channel Impedance Value: 1026 Ohm
Lead Channel Impedance Value: 1064 Ohm
Lead Channel Impedance Value: 1064 Ohm
Lead Channel Impedance Value: 1083 Ohm
Lead Channel Impedance Value: 1083 Ohm
Lead Channel Impedance Value: 1083 Ohm
Lead Channel Impedance Value: 294.5 Ohm
Lead Channel Impedance Value: 299.175
Lead Channel Impedance Value: 299.175
Lead Channel Impedance Value: 308.092
Lead Channel Impedance Value: 313.212
Lead Channel Impedance Value: 4047 Ohm
Lead Channel Impedance Value: 418 Ohm
Lead Channel Impedance Value: 475 Ohm
Lead Channel Impedance Value: 589 Ohm
Lead Channel Impedance Value: 589 Ohm
Lead Channel Impedance Value: 608 Ohm
Lead Channel Impedance Value: 646 Ohm
Lead Channel Pacing Threshold Amplitude: 0.375 V
Lead Channel Pacing Threshold Amplitude: 3.25 V
Lead Channel Pacing Threshold Pulse Width: 0.4 ms
Lead Channel Pacing Threshold Pulse Width: 1.5 ms
Lead Channel Sensing Intrinsic Amplitude: 5.5 mV
Lead Channel Sensing Intrinsic Amplitude: 5.5 mV
Lead Channel Setting Pacing Amplitude: 2.5 V
Lead Channel Setting Pacing Amplitude: 3.25 V
Lead Channel Setting Pacing Pulse Width: 0.4 ms
Lead Channel Setting Pacing Pulse Width: 1.5 ms
Lead Channel Setting Sensing Sensitivity: 0.3 mV

## 2019-05-13 NOTE — Telephone Encounter (Signed)
Pt informed and very appreciative. Christen Bame, CMA

## 2019-05-15 NOTE — Assessment & Plan Note (Addendum)
Patient's hypertension is managed by Dr. Caryl Comes as as he is the one feeling patient's medications.  Patient would benefit from ACE/ARB (allergy to enalipril in the past: cough).  Would ideally start losartan; however, given patient's worsening kidney function, will hold for now and follow-up with patient after [facility] appointment. Current medication includes labetalol 400 mg twice daily and hydralazine 25 mg 3 times daily and Lasix 80 mg daily

## 2019-05-16 ENCOUNTER — Other Ambulatory Visit: Payer: Self-pay | Admitting: Family Medicine

## 2019-05-16 ENCOUNTER — Encounter: Payer: Self-pay | Admitting: Family Medicine

## 2019-05-16 DIAGNOSIS — M65341 Trigger finger, right ring finger: Secondary | ICD-10-CM | POA: Insufficient documentation

## 2019-05-16 NOTE — Assessment & Plan Note (Signed)
Patient has follow-up with [facility] within the next month.  I have also placed a referral to chronic care management for patient's medications to optimize hypertension regimen and for renal dosing.

## 2019-05-16 NOTE — Assessment & Plan Note (Signed)
Patient counseled on therapies for trigger finger.  He would like to avoid injection at this time and continue conservative management.

## 2019-05-16 NOTE — Assessment & Plan Note (Signed)
A1c is 7.8 today and within goal range.  No changes in medication today.  We will continue Novolin 70/30 50 units daily and Trulicity 2.34 mg q. Thursday

## 2019-05-17 ENCOUNTER — Telehealth: Payer: Self-pay | Admitting: Family Medicine

## 2019-05-17 NOTE — Chronic Care Management (AMB) (Signed)
  Chronic Care Management   Note  05/17/2019 Name: Anthony [name] MRN: [id number] DOB: [birth year]  Anthony [name] is a 68 y.o. year old male who is a primary care patient of [name], MD. I reached out to [name] by phone today in response to a referral sent by Anthony [name] PCP, Dr. Zettie Cooley     Anthony [name] was given information about Chronic Care Management services today including:  1. CCM service includes personalized support from designated clinical staff supervised by his physician, including individualized plan of care and coordination with other care providers 2. 24/7 contact phone numbers for assistance for urgent and routine care needs. 3. Service will only be billed when office clinical staff spend 20 minutes or more in a month to coordinate care. 4. Only one practitioner may furnish and bill the service in a calendar month. 5. The patient may stop CCM services at any time (effective at the end of the month) by phone call to the office staff. 6. The patient will be responsible for cost sharing (co-pay) of up to 20% of the service fee (after annual deductible is met).  Patient agreed to services and verbal consent obtained.   Follow up plan: Telephone appointment with CCM team member scheduled for:05/25/2019  Glenna Durand, LPN Health Advisor, [place] Management ??[email or web address] ??[phone]

## 2019-05-24 ENCOUNTER — Telehealth: Payer: Self-pay

## 2019-05-24 NOTE — Telephone Encounter (Signed)
Noted. Thank you for the message. Patient has f/u with Pharmacy on 05/25/19 at 9 am. If he needs to continue the gabapentin, he can certainly do so, as our goal was to decrease he medication burden when taking off gabapentin.

## 2019-05-24 NOTE — Telephone Encounter (Signed)
Patient calls nurse line stating he is unable to wean himself off of Gabapentin. Patient stated his leg has been bothering him, therefore he is back to taking 1 tablet every night at bedtime. Patient stated he did go down to 1 tab every other night, however has been taking 1 table every night since 12/24.

## 2019-05-25 ENCOUNTER — Ambulatory Visit: Payer: HMO | Admitting: Pharmacist

## 2019-05-25 ENCOUNTER — Other Ambulatory Visit: Payer: Self-pay

## 2019-05-25 ENCOUNTER — Encounter: Payer: Self-pay | Admitting: Family Medicine

## 2019-05-25 DIAGNOSIS — I129 Hypertensive chronic kidney disease with stage 1 through stage 4 chronic kidney disease, or unspecified chronic kidney disease: Secondary | ICD-10-CM | POA: Diagnosis not present

## 2019-05-25 DIAGNOSIS — N184 Chronic kidney disease, stage 4 (severe): Secondary | ICD-10-CM | POA: Diagnosis not present

## 2019-05-25 DIAGNOSIS — E1122 Type 2 diabetes mellitus with diabetic chronic kidney disease: Secondary | ICD-10-CM | POA: Diagnosis not present

## 2019-05-25 DIAGNOSIS — N2581 Secondary hyperparathyroidism of renal origin: Secondary | ICD-10-CM | POA: Diagnosis not present

## 2019-05-25 NOTE — Progress Notes (Signed)
Received fax from [employer] supply for continuous glucose monitoring system.  They are requesting recent clinic notes that show the following  Patient has had documented doctors visit to evaluate her diabetes control within the last 6 months  Patient performs for more blood glucose checks per day utilizing a home blood glucose monitor or currently uses CGM  Patient is insulin treated with 3 or more daily injections or uses a infusion pump  Patient's insulin treatment regimen requires frequent adjustment   The patient does not meet these requirements for CGM and thus I cannot sign this paperwork.  Will forward to pool to reach out to patient.

## 2019-05-25 NOTE — Progress Notes (Signed)
Chronic Care Management   Initial Visit Note  05/25/2019 Name: Anthony [name] MRN: [id number] DOB: 1951-01-19  Referred by: Anthony Oliphant, MD Reason for referral : Chronic Care Management and Diabetes   Anthony [name] is a 68 y.o. year old male who is a primary care patient of Anthony [name], Anthony Quale, MD. The CCM team was consulted for assistance with chronic disease management and care coordination needs related to HTN, HLD and DMII  Review of patient status, including review of consultants reports, relevant laboratory and other test results, and collaboration with appropriate care team members and the patient's provider was performed as part of comprehensive patient evaluation and provision of chronic care management services.    I spoke with Anthony [name] by telephone today regarding his diabetes management  Medications: Outpatient Encounter Medications as of 05/25/2019  Medication Sig Note  . Blood Glucose Monitoring Suppl (ONETOUCH VERIO) w/Device KIT Use kit to check blood sugars.   . calcitRIOL (ROCALTROL) 0.5 MCG capsule Take 0.5 mcg by mouth daily at 12 noon.    . Dulaglutide (TRULICITY) 5.43 KG/6.7PC SOPN Inject 0.75 mg into the skin every Thursday. In the morning 09/23/2018: Patient receiving free through manufacturer program [employer])  . ELIQUIS 5 MG TABS tablet TAKE 1 TABLET BY MOUTH TWICE A DAY   . furosemide (LASIX) 40 MG tablet Take 1 tablet (40 mg total) by mouth daily. (Patient taking differently: Take 80 mg by mouth daily. )   . gabapentin (NEURONTIN) 300 MG capsule Take 1 capsule (300 mg total) by mouth at bedtime.   Marland Kitchen glucose blood (ONETOUCH VERIO) test strip Use as instructed   . hydrALAZINE (APRESOLINE) 25 MG tablet Take 1 tablet (25 mg total) by mouth 3 (three) times daily.   . Insulin Isophane & Regular Human (NOVOLIN 70/30 FLEXPEN) (70-30) 100 UNIT/ML PEN Inject 50 Units into the skin daily.   . Insulin Pen Needle (B-D ULTRAFINE III SHORT PEN) 31G X 8 MM MISC 1 UNITS BY DOES  NOT APPLY ROUTE AS NEEDED.   Marland Kitchen KLOR-CON M20 20 MEQ tablet TAKE 1 TABLET BY MOUTH TWICE A DAY   . labetalol (NORMODYNE) 200 MG tablet TAKE 2 TABLETS (400 MG TOTAL) BY MOUTH 2 (TWO) TIMES DAILY. Please keep upcoming appt in October with Dr. Caryl Comes for future refills. Thank you   . Lancets (ONETOUCH ULTRASOFT) lancets Use as instructed   . Multiple Vitamin (MULTIVITAMIN) tablet Take 1 tablet by mouth daily.     . nitroGLYCERIN (NITROSTAT) 0.4 MG SL tablet Place 0.4 mg under the tongue every 5 (five) minutes as needed for chest pain. If no relief by 3rd tab, call 911  08/20/2017: Never used  . omeprazole (PRILOSEC) 40 MG capsule TAKE ONE CAPSULE BY MOUTH DAILY BEFORE SUPPER   . oxymorphone (OPANA) 5 MG tablet Take 5 mg by mouth 3 (three) times daily as needed for pain (max 3 tablets daily). Per Dr Brien Few 06/16/2018: Patient only taking TWICE DAILY  . PARoxetine (PAXIL) 40 MG tablet Take 1 tablet (40 mg total) by mouth every morning.   . rosuvastatin (CRESTOR) 5 MG tablet TAKE 1 TABLET BY MOUTH EVERY DAY    No facility-administered encounter medications on file as of 05/25/2019.     Objective:   Goals Addressed            This Visit's Progress     Patient Stated   . I would like to optimize my diabetes management (pt-stated)       Current  Barriers:  . Diabetes: T0VX; complicated by chronic medical conditions including HTN, HLD, most recent A1c 7.8% . Current antihyperglycemic regimen: Trulicity 7.93, Humalog 75.25 mix-50 units in the AM o Patient wishes to continue current regimen  o Will explore optimizing GLP1, transition insulin mix to basal o Clinic visit scheduled with patient on 06/01/19; will complete PAP process at that time; patient to bring in forms . Denies hypoglycemic symptoms; Denies hyperglycemic symptoms . 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 Current exercise: attempts to walk 10-57mn per day, limited by back pain, muscle weakness improving . Current blood glucose readings: checks Bgs around 10am-1pm: 105-130, 127 this AM . Cardiovascular risk reduction: o Current hypertensive regimen: hydralazine, labetalol (ACEi induced cough) o Current hyperlipidemia regimen: rosuvastatin (tolerating well) o Current antiplatelet regimen: apixaban  o Neuropathy: Patient restarted gabapentin [id number] on 05/20/19.  He states he experienced muscle weakness when he stopped taking this medication.  This condition is improving with re-initiation of gabapentin.  Continue current management   Pharmacist Clinical Goal(s):  . Marland Kitchenver the next 90 days, patient will work with PharmD and primary care provider to address related to optimization of medication management of diabetes  Interventions: . Comprehensive medication review performed, medication list updated in electronic medical record . Reviewed & discussed the following diabetes-related information with patient: o Reviewed ADA recommended "diabetes-friendly" diet  (reviewed healthy snack/food options) o Discussed insulin/GLP-1 injection technique; Patient uses One touch Verio glucometer o Reviewed medication purpose/side effects-->patient denies adverse events   Patient Self Care Activities:  . Patient will check blood glucose daily, document, and provide at future appointments . Patient will focus on medication adherence . Patient will take medications as prescribed . Patient will contact provider with any episodes of hypoglycemia . Patient will report any questions or concerns to provider   Initial goal documentation         [name] given information about Chronic Care Management services today including:  1. CCM service includes personalized support from  designated clinical staff supervised by his physician, including individualized plan of care and coordination with other care providers 2. 24/7 contact phone numbers for assistance for urgent and routine care needs. 3. Service will only be billed when office clinical staff spend 20 minutes or more in a month to coordinate care. 4. Only one practitioner may furnish and bill the service in a calendar month. 5. The patient may stop CCM services at any time (effective at the end of the month) by phone call to the office staff. 6. The patient will be responsible for cost sharing (co-pay) of up to 20% of the service fee (after annual deductible is met).  Patient agreed to services and verbal consent obtained.   Plan:   Face to Face appointment with care management team member scheduled for:  06/01/2019 at 10RandolphProvider Signature JuRegina EckPharmD, BCPS Clinical Pharmacist, [phone]

## 2019-06-01 ENCOUNTER — Ambulatory Visit: Payer: HMO | Admitting: Pharmacist

## 2019-06-01 ENCOUNTER — Other Ambulatory Visit: Payer: Self-pay

## 2019-06-01 DIAGNOSIS — I10 Essential (primary) hypertension: Secondary | ICD-10-CM

## 2019-06-01 DIAGNOSIS — E119 Type 2 diabetes mellitus without complications: Secondary | ICD-10-CM

## 2019-06-01 DIAGNOSIS — Z794 Long term (current) use of insulin: Secondary | ICD-10-CM

## 2019-06-01 NOTE — Progress Notes (Addendum)
Chronic Care Management    Visit Note  06/01/2019 Name: Anthony [name] MRN: [id number] DOB: 1950/12/23  Referred by: [name], MD Reason for referral : Chronic Care Management   LAEL PILCH is a 69 y.o. year old male who is a primary care patient of [name], [name], MD. The CCM team was consulted for assistance with chronic disease management and care coordination needs related to HTN and DMII  Review of patient status, including review of consultants reports, relevant laboratory and other test results, and collaboration with appropriate care team members and the patient's provider was performed as part of comprehensive patient evaluation and provision of chronic care management services.    I met with Mr. Propes today in clinic  Medications: Outpatient Encounter Medications as of 06/01/2019  Medication Sig Note  . Blood Glucose Monitoring Suppl (ONETOUCH VERIO) w/Device KIT Use kit to check blood sugars.   . calcitRIOL (ROCALTROL) 0.5 MCG capsule Take 0.5 mcg by mouth daily at 12 noon.    . Dulaglutide (TRULICITY) 2.84 XL/2.4MW SOPN Inject 0.75 mg into the skin every Thursday. In the morning 09/23/2018: Patient receiving free through manufacturer program [employer])  . ELIQUIS 5 MG TABS tablet TAKE 1 TABLET BY MOUTH TWICE A DAY   . furosemide (LASIX) 40 MG tablet Take 1 tablet (40 mg total) by mouth daily. (Patient taking differently: Take 80 mg by mouth daily. )   . gabapentin (NEURONTIN) 300 MG capsule Take 1 capsule (300 mg total) by mouth at bedtime.   Marland Kitchen glucose blood (ONETOUCH VERIO) test strip Use as instructed   . hydrALAZINE (APRESOLINE) 25 MG tablet Take 1 tablet (25 mg total) by mouth 3 (three) times daily.   . Insulin Isophane & Regular Human (NOVOLIN 70/30 FLEXPEN) (70-30) 100 UNIT/ML PEN Inject 50 Units into the skin daily.   . Insulin Pen Needle (B-D ULTRAFINE III SHORT PEN) 31G X 8 MM MISC 1 UNITS BY DOES NOT APPLY ROUTE AS NEEDED.   Marland Kitchen KLOR-CON M20 20 MEQ tablet TAKE 1  TABLET BY MOUTH TWICE A DAY   . labetalol (NORMODYNE) 200 MG tablet TAKE 2 TABLETS (400 MG TOTAL) BY MOUTH 2 (TWO) TIMES DAILY. Please keep upcoming appt in October with Dr. Caryl Comes for future refills. Thank you   . Lancets (ONETOUCH ULTRASOFT) lancets Use as instructed   . Multiple Vitamin (MULTIVITAMIN) tablet Take 1 tablet by mouth daily.     . nitroGLYCERIN (NITROSTAT) 0.4 MG SL tablet Place 0.4 mg under the tongue every 5 (five) minutes as needed for chest pain. If no relief by 3rd tab, call 911  08/20/2017: Never used  . omeprazole (PRILOSEC) 40 MG capsule TAKE ONE CAPSULE BY MOUTH DAILY BEFORE SUPPER   . oxymorphone (OPANA) 5 MG tablet Take 5 mg by mouth 3 (three) times daily as needed for pain (max 3 tablets daily). Per Dr Brien Few 06/16/2018: Patient only taking TWICE DAILY  . PARoxetine (PAXIL) 40 MG tablet Take 1 tablet (40 mg total) by mouth every morning.   . rosuvastatin (CRESTOR) 5 MG tablet TAKE 1 TABLET BY MOUTH EVERY DAY    No facility-administered encounter medications on file as of 06/01/2019.     Objective:   Goals Addressed            This Visit's Progress     Patient Stated   . I would like to optimize my diabetes management (pt-stated)       Current Barriers:  . Diabetes: N0UV; complicated by  chronic medical conditions including HTN, HLD, most recent A1c 7.8% . Current antihyperglycemic regimen: Trulicity 8.52, Humalog 75.25 mix-50 units in the AM o Plan to transition patient to [place] 35 units daily and Ozempic 0.42m - Will explore optimizing GLP1, transition insulin mix to basal - Clinic visit completed with patient on 06/01/19; PAPs signed by patient. MD portion left in PCP mail box. Patient to bring in forms . Denies hypoglycemic symptoms; Denies hyperglycemic symptoms . Current meal patterns: o Breakfast: eggs,bacon, toast-coffee (no sugar) o Lunch: sometimes skips, will have a pack of crackers around 3pm o Supper: at 5-6pm chicken/fish, veggie, baked potato  (encouraged pt to limit salt/half of potato) o Snacks: not a big snacker o Drinks: avoids sugary drinks, mostly coffee, tea or water . Current exercise: attempts to walk 10-155m per day, limited by back pain, muscle weakness improving . Current blood glucose readings: checks Bgs around 10am-1pm: per glucometer--patient has not checked Bgs consistently.  Readings in the AM 130-200s, Having Bg<70 around 2-3pm when he doesn't eat. . Cardiovascular risk reduction: o Current hypertensive regimen: hydralazine, labetalol (ACEi induced cough) - Encouraged patient to check BP 3x weekly - He brought in Omron BP machine today - BP at recent Kidney MD visit was 130/88.  Goal 130/80. Readings 150-160/80-90 on current BP machine - Encouraged low salt diet; discussed foods o Current hyperlipidemia regimen: rosuvastatin (tolerating well) o Current antiplatelet regimen: apixaban  o Neuropathy: Patient restarted gabapentin 30020mHS on 05/20/19.  He states he experienced muscle weakness when he stopped taking this medication.  This condition is improving with re-initiation of gabapentin.  Continue current management   Pharmacist Clinical Goal(s):  . OMarland Kitchener the next 90 days, patient will work with PharmD and primary care provider to address related to optimization of medication management of diabetes  Interventions: . Comprehensive medication review performed, medication list updated in electronic medical record . Reviewed & discussed the following diabetes-related information with patient: o Reviewed ADA recommended "diabetes-friendly" diet  (reviewed healthy snack/food options) o Discussed insulin/GLP-1 injection technique; Patient uses One touch Verio glucometer o Reviewed medication purpose/side effects-->patient denies adverse events   Patient Self Care Activities:  . Patient will check blood glucose daily, document, and provide at future appointments . Patient will focus on medication  adherence . Patient will take medications as prescribed . Patient will contact provider with any episodes of hypoglycemia . Patient will report any questions or concerns to provider   Please see past updates related to this goal by clicking on the "Past Updates" button in the selected goal          Plan:   The care management team will reach out to the patient again over the next 14 days.   Provider Signature JulRegina EckharmD, BCPS Clinical Pharmacist, [facility] [phone]

## 2019-06-02 ENCOUNTER — Other Ambulatory Visit: Payer: Self-pay

## 2019-06-02 ENCOUNTER — Emergency Department (HOSPITAL_COMMUNITY)
Admission: EM | Admit: 2019-06-02 | Discharge: 2019-06-02 | Disposition: A | Discharge status: Home / Self Care | Payer: HMO | Attending: Emergency Medicine | Admitting: Emergency Medicine

## 2019-06-02 DIAGNOSIS — Z5321 Procedure and treatment not carried out due to patient leaving prior to being seen by health care provider: Secondary | ICD-10-CM | POA: Diagnosis not present

## 2019-06-02 DIAGNOSIS — E162 Hypoglycemia, unspecified: Secondary | ICD-10-CM | POA: Diagnosis not present

## 2019-06-02 DIAGNOSIS — R2689 Other abnormalities of gait and mobility: Secondary | ICD-10-CM | POA: Diagnosis not present

## 2019-06-02 DIAGNOSIS — R531 Weakness: Secondary | ICD-10-CM | POA: Diagnosis present

## 2019-06-02 LAB — CBG MONITORING, ED
Glucose-Capillary: 30 mg/dL — CL (ref 70–99)
Glucose-Capillary: 95 mg/dL (ref 70–99)

## 2019-06-02 NOTE — ED Notes (Signed)
Pt states he "feels much better, if I knew it was my blood sugar I would have done something about it and not came to ED".  Pt wanting to leave.  Encouraged pt to stay, pt declined, advised pt to call 911 or to return immediately to ED if symptoms worsens or persists.

## 2019-06-02 NOTE — ED Triage Notes (Signed)
Pt went to [facility] to have COVID 19 test.  Pt states at 1pm he started feeling like legs are weak and he is off balance.  Pt requesting BS checked.  Pt states he normally eats around 3pm and has not ate.  Pt states all he came for is COVID test and BS check.   Pt wanting to leave.   Pt was given OJ, graham crackers.  Advised pt we will need to recheck BS again.

## 2019-06-02 NOTE — ED Notes (Signed)
RN Maryfrances Bunnell notified of low sugar. Pt was given by this tech orange juice and some crackers.

## 2019-06-03 ENCOUNTER — Other Ambulatory Visit: Payer: Self-pay | Admitting: Internal Medicine

## 2019-06-03 ENCOUNTER — Ambulatory Visit: Payer: Self-pay | Admitting: Pharmacist

## 2019-06-03 DIAGNOSIS — E119 Type 2 diabetes mellitus without complications: Secondary | ICD-10-CM

## 2019-06-03 NOTE — Progress Notes (Signed)
  Chronic Care Management   Outreach Note  06/03/2019 Name: Anthony [name] MRN: [id number] DOB: Feb 05, 1951  Referred by: [name], MD Reason for referral : Chronic Care Management (Diabetes)    Follow Up Plan: Face to Face appointment with care management team member scheduled for: 06/08/2019 at 10am.  We will provide Ozempic teaching and sample at that time.  Paperwork submitted for [employer] program 2021 Tyler Aas, [place]).  Will continue to follow.  SIGNATURE Regina Eck, PharmD, BCPS Clinical Pharmacist, [place]: [phone]

## 2019-06-04 ENCOUNTER — Other Ambulatory Visit: Payer: Self-pay

## 2019-06-04 ENCOUNTER — Ambulatory Visit: Payer: HMO | Attending: Internal Medicine

## 2019-06-04 DIAGNOSIS — Z20822 Contact with and (suspected) exposure to covid-19: Secondary | ICD-10-CM

## 2019-06-04 DIAGNOSIS — E119 Type 2 diabetes mellitus without complications: Secondary | ICD-10-CM | POA: Diagnosis not present

## 2019-06-04 NOTE — Patient Outreach (Signed)
  [place] [facility]) Care Management Chronic Special Needs Program    06/04/2019  Name: Anthony [name], DOB: 03-02-1951  MRN: [id number]   Anthony [name] is enrolled in a chronic special needs plan. RNCM returned call to client. No answer. HIPPA compliant left. No health risk assessment completed at this time.  Plan: RNCM will attempt outreach next week to follow up and complete health risk assessment.  Thea Silversmith, RN, MSN, [place] [phone]

## 2019-06-06 LAB — NOVEL CORONAVIRUS, NAA: SARS-CoV-2, NAA: NOT DETECTED

## 2019-06-07 ENCOUNTER — Ambulatory Visit: Payer: Self-pay | Admitting: Pharmacist

## 2019-06-07 DIAGNOSIS — E119 Type 2 diabetes mellitus without complications: Secondary | ICD-10-CM

## 2019-06-07 DIAGNOSIS — Z794 Long term (current) use of insulin: Secondary | ICD-10-CM

## 2019-06-07 NOTE — Progress Notes (Signed)
  Chronic Care Management   Outreach Note  06/07/2019 Name: Anthony [name] MRN: [id number] DOB: 20-Dec-1950  Referred by: [name], MD Reason for referral : Chronic care management-Diabetes  Successful outreach call to [name] to follow up after his ED visit.  Patient attempted to get COVID test at [facility] on 06/02/2019 and was told to go to the ED where he began feeling shaky/sweaty/dizzy.  BG was in the 30s per ED notes and patient report.  Patient feels fine, no symptoms.  He states he skipped over lunch.  His other activities were normal that day.  Instructed patient to check at least twice daily and if symptomatic.  He hasn't not checked BG today.  He was also instructed to eat 3 steady meals daily.   Follow Up Plan: Face to Face appointment with care management team member scheduled for: 06/08/2019   Regina Eck, PharmD, BCPS Clinical Pharmacist, [place]: [phone]

## 2019-06-08 ENCOUNTER — Ambulatory Visit: Payer: HMO | Admitting: Pharmacist

## 2019-06-08 ENCOUNTER — Other Ambulatory Visit: Payer: Self-pay

## 2019-06-08 DIAGNOSIS — E119 Type 2 diabetes mellitus without complications: Secondary | ICD-10-CM

## 2019-06-08 DIAGNOSIS — E7800 Pure hypercholesterolemia, unspecified: Secondary | ICD-10-CM

## 2019-06-08 NOTE — Progress Notes (Addendum)
Chronic Care Management    Visit Note  06/08/2019 Name: Anthony [name] MRN: [id number] DOB: April 12, 1951  Referred by: [name], MD Reason for referral : Chronic Care Management (Diabetes)   Anthony [name] is a 69 y.o. year old male who is a primary care patient of [name], [name], MD. The CCM team was consulted for assistance with chronic disease management and care coordination needs related to HLD and DMII  Review of patient status, including review of consultants reports, relevant laboratory and other test results, and collaboration with appropriate care team members and the patient's provider was performed as part of comprehensive patient evaluation and provision of chronic care management services.    I met with Anthony [name] in clinic today.  Medications: Outpatient Encounter Medications as of 06/08/2019  Medication Sig Note  . Blood Glucose Monitoring Suppl (ONETOUCH VERIO) w/Device KIT Use kit to check blood sugars.   . calcitRIOL (ROCALTROL) 0.5 MCG capsule Take 0.5 mcg by mouth daily at 12 noon.    Marland Kitchen ELIQUIS 5 MG TABS tablet TAKE 1 TABLET BY MOUTH TWICE A DAY   . furosemide (LASIX) 40 MG tablet Take 1 tablet (40 mg total) by mouth daily. (Patient taking differently: Take 80 mg by mouth daily. )   . gabapentin (NEURONTIN) 300 MG capsule Take 1 capsule (300 mg total) by mouth at bedtime.   Marland Kitchen glucose blood (ONETOUCH VERIO) test strip Use as instructed   . hydrALAZINE (APRESOLINE) 25 MG tablet Take 1 tablet (25 mg total) by mouth 3 (three) times daily.   . Insulin Isophane & Regular Human (NOVOLIN 70/30 FLEXPEN) (70-30) 100 UNIT/ML PEN Inject 50 Units into the skin daily.   . Insulin Pen Needle (B-D ULTRAFINE III SHORT PEN) 31G X 8 MM MISC 1 UNITS BY DOES NOT APPLY ROUTE AS NEEDED.   Marland Kitchen KLOR-CON M20 20 MEQ tablet TAKE 1 TABLET BY MOUTH TWICE A DAY   . labetalol (NORMODYNE) 200 MG tablet TAKE 2 TABLETS BY MOUTH TIMES DAILY.   Marland Kitchen Lancets (ONETOUCH ULTRASOFT) lancets Use as instructed     . Multiple Vitamin (MULTIVITAMIN) tablet Take 1 tablet by mouth daily.     . nitroGLYCERIN (NITROSTAT) 0.4 MG SL tablet Place 0.4 mg under the tongue every 5 (five) minutes as needed for chest pain. If no relief by 3rd tab, call 911  08/20/2017: Never used  . omeprazole (PRILOSEC) 40 MG capsule TAKE ONE CAPSULE BY MOUTH DAILY BEFORE SUPPER   . oxymorphone (OPANA) 5 MG tablet Take 5 mg by mouth 3 (three) times daily as needed for pain (max 3 tablets daily). Per Dr Brien Few 06/16/2018: Patient only taking TWICE DAILY  . PARoxetine (PAXIL) 40 MG tablet Take 1 tablet (40 mg total) by mouth every morning.   . rosuvastatin (CRESTOR) 5 MG tablet TAKE 1 TABLET BY MOUTH EVERY DAY   . [DISCONTINUED] Dulaglutide (TRULICITY) 4.66 ZL/9.3TT SOPN Inject 0.75 mg into the skin every Thursday. In the morning 09/23/2018: Patient receiving free through manufacturer program ([place])   No facility-administered encounter medications on file as of 06/08/2019.     Objective:   Goals Addressed            This Visit's Progress     Patient Stated   . I would like to optimize my diabetes management (pt-stated)       Current Barriers:  . Diabetes: S1XB; complicated by chronic medical conditions including HTN, HLD, most recent A1c 7.8% . Current antihyperglycemic regimen: Ozempic 0.82m  weekly, Humalog 75.25 mix-45 units in the AM o Plan to transition patient to [place] 30-35 units daily and Ozempic 0.47m - Will explore optimizing GLP1, transition insulin mix to basal - Clinic visit completed with patient on 06/08/19-PAP completed and faxed to [facility]. Denies hypoglycemic symptoms; Denies hyperglycemic symptoms . Current meal patterns: o Breakfast: eggs,bacon, toast-coffee (no sugar) o Lunch: sometimes skips, will have a pack of crackers around 3pm o Supper: at 5-6pm chicken/fish, veggie, baked potato (encouraged pt to limit salt/half of potato) o Snacks: not a big snacker o Drinks: avoids sugary drinks, mostly  coffee, tea or water - Discovered patient was drinking gingerale x2 daily at times.  Reviewed sugar content 36gm in 12oz.  Encouraged pt to try diet or bubbly flavored water alternative . Current exercise: attempts to walk 10-1103m per day, limited by back pain, muscle weakness improving . Current blood glucose readings: checks Bgs around 10am-1pm: per glucometer--patient has not checked Bgs consistently.  Readings in the AM 130-200s, Having Bg<70 around 2-3pm when he doesn't eat. o Reported low BG in 30s at the ED last week.  Patient was did not each much on this day and all symptoms are attributed to low BGs.  Reviewed hypoglycemia protocol . Cardiovascular risk reduction: o Current hypertensive regimen: hydralazine, labetalol (ACEi induced cough) - Encouraged patient to check BP 3x weekly - He brought in Omron BP machine  - BP at recent Kidney MD visit was 130/88.  Goal 130/80. Readings 150-160/80-90 on current BP machine - Encouraged low salt diet; discussed foods o Current hyperlipidemia regimen: rosuvastatin 1m34mHS (tolerating well) last LDL 100 in 11/2018 o Current antiplatelet regimen: apixaban  o Neuropathy: Patient restarted gabapentin 300m66mS on 05/20/19.  He states he experienced muscle weakness when he stopped taking this medication.  This condition is improving with re-initiation of gabapentin.  Continue current management   Pharmacist Clinical Goal(s):  . OvMarland Kitchenr the next 90 days, patient will work with PharmD and primary care provider to address related to optimization of medication management of diabetes  Interventions: . Comprehensive medication review performed, medication list updated in electronic medical record . Reviewed & discussed the following diabetes-related information with patient: o Reviewed ADA recommended "diabetes-friendly" diet  (reviewed healthy snack/food options) o Discussed insulin/GLP-1 injection technique; Patient uses One touch Verio  glucometer o Reviewed medication purpose/side effects-->patient denies adverse events   Patient Self Care Activities:  . Patient will check blood glucose daily, document, and provide at future appointments . Patient will focus on medication adherence . Patient will take medications as prescribed . Patient will contact provider with any episodes of hypoglycemia . Patient will report any questions or concerns to provider   Please see past updates related to this goal by clicking on the "Past Updates" button in the selected goal         Plan:   The care management team will reach out to the patient again over the next 7 days.   Provider Signature JuliRegina EckarmD, BCPS Clinical Pharmacist, [phone]

## 2019-06-09 ENCOUNTER — Other Ambulatory Visit: Payer: Self-pay | Admitting: Pharmacy Technician

## 2019-06-09 NOTE — Patient Outreach (Addendum)
[place] [facility]) Care Management  06/09/2019  JOSHUS ROGAN [birth year] [phone]   Received patient and provider portion(s) of patient assistance application(s) for Tyler Aas and Ozempic however, based on income documents would need to provide copy of LIS denial letter  Will inform [place] of findings.  [name] Garrett Certified Pharmacy Technician [place] Management Direct Dial:[phone]

## 2019-06-11 ENCOUNTER — Other Ambulatory Visit: Payer: Self-pay

## 2019-06-11 NOTE — Patient Outreach (Addendum)
[place] [facility]) Care Management Chronic Special Needs Program  06/11/2019  Name: Anthony [name] DOB: [birth year]  MRN: [id number]  [name] is enrolled in a chronic special needs plan for Heart Failure. Chronic Care Management Coordinator return call to client: reports his question was answered. Call to also assist client with completion of health risk assessment and to update individualized care plan.  RNCM reviewed the chronic care management program, importance of client participation, and taking their care plan to all provider appointments and inpatient facilities.    Subjective: Client reports a history of heart failure, heart disease, congestive heart failure, heart attack, HTN, atrial fibrillation and state years ago he was told he had a light stroke with no deficits. Client reports he is able to get his medications and has been working with pharmacist in provider practice regarding medications and medication assistance. Client states he has transportation to provider visits and denies any concerns at this time.  History of heart failure. Client states he is not weighing self because his scales need adjusting. When asked to explain. Client states my scales and the doctor scale weight is not the same. Client states he follows up with provider visits and takes medications as prescribed. Client denies any edema at this time.  History of diabetes. Client reports he is attending provider visits and is actively working with pharmacist at primary care office to improve blood sugar control. Client states he checks blood sugar once a day. Per client his last A1C was 7.5. RNCM noted A1C 7.8 on 05/10/2019  Reports current smoker and currently in a smoking cessation program.  Client states he has a medical power of attorney, but would like to complete an advanced directive and request another packet.  Goals Addressed            This Visit's Progress   . COMPLETED: Client  understands the importance of follow-up with providers by attending scheduled visits       Discussed importance of following up with as scheduled with providers in managing your health.  Voiced you do not miss your doctor appointments.    . Client verbalize knowledge of Heart Failure disease self management skills within the next 6 months(continued 06/11/2019)   On track     Reinforced action plan to call cardiologist for weight gain over 3 pounds overnight or 5 pounds in a week; increased shortness of breath, increased fatigue, increased swelling or edema, chest discomfort.   Heart failure self management actions: Know signs and symptoms of congestive heart failure exacerbation. Know when to call the doctor to who to call. Verbalize how fluid intake can affect congestive heart failure. Eat healthy and monitor salt intake. Visit your primary care or cardiologist as scheduled. Weight daily and record.      . Client will verbalize knowledge of self management of Hypertension as evidences by BP reading of 140/90 or less; or as defined by provider   No change    Please review educational material regarding taking blood pressure.    Marland Kitchen HEMOGLOBIN A1C < 7.0       Diabetes self management actions:  Glucose monitoring per provider recommendations  Eat Healthy  Check feet daily  Visit provider every 3-6 months as directed  Hbg A1C level every 3-6 months.  Eye Exam yearly    . COMPLETED: Maintain timely refills of diabetic medication as prescribed within the year .       Denies any difficulty obtaining medications.    Marland Kitchen  COMPLETED: Maintain timely refills of Heart Failure medication as prescribed within the year        Denies any difficulty with obtaining medications.    . COMPLETED: Obtain annual  Lipid Profile, LDL-C       Done 12/23/2018    . Obtain Annual Eye (retinal)  Exam    Not on track    Reports has an upcoming appointment.    . COMPLETED: Obtain annual screen for micro  albuminuria (urine) , nephropathy (kidney problems)       Discussed this is a urine test that reflects how well your your kidneys may be working. Per client Done by kidney doctor.     . COMPLETED: Obtain Hemoglobin A1C at least 2 times per year   On track    Completed A1C 8.0 on 12/23/2018 and 7.8 on 05/10/2019. Provided positive feedback regarding improvement of A1C.    . Quit Smoking (pt-stated)   On track    Discussed progress on smoking cessation. Client states he is receiving information regarding smoking cessation.    . COMPLETED: Visit Primary Care Provider or Cardiologist at least 2 times per year   On track    Addressed follow up appointment with cardiologist-remote cardiology device check on 08/11/2019, discussed follow up appointment with primary care.      RNCM discussed importance of daily weights, encouraged client to continue to use his scales and educated client that his scale and his doctors scale may not have the exact same result. Encouraged client to use his scales daily and record weights to look for any trends or patterns. Reinforced action plan to call cardiologist for weight gain over 3 pounds overnight or 5 pounds in a week; increased shortness of breath, increased fatigue, chest discomfort. Low salt diet discussed.   Client has both diabetes and heart failure, upon assessment and review, client in heart failure program to provide additional education and reinforcement of heart failure self management skills. Self management deficit- client not weighing daily and Knowledge deficit related importance of daily weights.    Covid-19 precautions discussed-encouraged client to continue wearing face mask; wait at least 6 feet distance and handwashing.  RNCM encouraged client to call 24 hour nurse advice line as needed. Encouraged client to call healthcare concierge for benefits questions. RNCM encouraged to call RNCM as needed.  Plan: send updated careplan to primary care; send  updated careplan to primary care. Send advanced care packet. Send Neurosurgeon. RNCM will outreach within the next 6 months.   Thea Silversmith, RN, MSN, [place] [phone]

## 2019-06-12 ENCOUNTER — Other Ambulatory Visit: Payer: Self-pay | Admitting: Family Medicine

## 2019-06-14 ENCOUNTER — Ambulatory Visit: Payer: Self-pay | Admitting: Pharmacist

## 2019-06-14 DIAGNOSIS — E119 Type 2 diabetes mellitus without complications: Secondary | ICD-10-CM

## 2019-06-15 ENCOUNTER — Ambulatory Visit: Payer: Self-pay | Admitting: Licensed Clinical Social Worker

## 2019-06-15 ENCOUNTER — Telehealth: Payer: HMO

## 2019-06-15 NOTE — Chronic Care Management (AMB) (Signed)
  Care Management   Clinical Social Work Outreach Note  06/15/2019 Name: Anthony [name] MRN: [id number] DOB: 1951-04-28  Anthony [name] is a 69 y.o. year old male who is a primary care patient of [name], MD . The Care Management team was consulted for assistance with Advanced Directive Education.   LCSW reached out to Anthony [name] today by phone to introduce and schedule phone appointment,    Plan: phone appointment scheduled 06/16/2019  Casimer Lanius, [place] / [place]   [phone] 1:24 PM

## 2019-06-15 NOTE — Progress Notes (Signed)
Chronic Care Management   Visit Note  06/14/2019 Name: Anthony [name] MRN: [id number] DOB: 1950/12/20  Referred by: [name], MD Reason for referral : Chronic Care Management (Diabetes)   Anthony [name] is a 69 y.o. year old male who is a primary care patient of [name], [name], MD. The CCM team was consulted for assistance with chronic disease management and care coordination needs related to DMII  Review of patient status, including review of consultants reports, relevant laboratory and other test results, and collaboration with appropriate care team members and the patient's provider was performed as part of comprehensive patient evaluation and provision of chronic care management services.    I spoke with Mr. Vandervelden by telephone today.  Medications: Outpatient Encounter Medications as of 06/14/2019  Medication Sig Note  . Blood Glucose Monitoring Suppl (ONETOUCH VERIO) w/Device KIT Use kit to check blood sugars.   . calcitRIOL (ROCALTROL) 0.5 MCG capsule Take 0.5 mcg by mouth daily at 12 noon.    Marland Kitchen ELIQUIS 5 MG TABS tablet TAKE 1 TABLET BY MOUTH TWICE A DAY   . furosemide (LASIX) 40 MG tablet Take 1 tablet (40 mg total) by mouth daily. (Patient taking differently: Take 80 mg by mouth daily. ) 06/11/2019: Reports  takes 80 mg daily  . gabapentin (NEURONTIN) 300 MG capsule TAKE ONE CAPSULE BY MOUTH EVERY NIGHT AT BEDTIME   . glucose blood (ONETOUCH VERIO) test strip Use as instructed   . hydrALAZINE (APRESOLINE) 25 MG tablet Take 1 tablet (25 mg total) by mouth 3 (three) times daily.   . Insulin Isophane & Regular Human (NOVOLIN 70/30 FLEXPEN) (70-30) 100 UNIT/ML PEN Inject 50 Units into the skin daily.   . Insulin Pen Needle (B-D ULTRAFINE III SHORT PEN) 31G X 8 MM MISC 1 UNITS BY DOES NOT APPLY ROUTE AS NEEDED.   Marland Kitchen KLOR-CON M20 20 MEQ tablet TAKE 1 TABLET BY MOUTH TWICE A DAY   . labetalol (NORMODYNE) 200 MG tablet TAKE 2 TABLETS BY MOUTH TIMES DAILY.   Marland Kitchen Lancets (ONETOUCH  ULTRASOFT) lancets Use as instructed   . Multiple Vitamin (MULTIVITAMIN) tablet Take 1 tablet by mouth daily.     . nitroGLYCERIN (NITROSTAT) 0.4 MG SL tablet Place 0.4 mg under the tongue every 5 (five) minutes as needed for chest pain. If no relief by 3rd tab, call 911  08/20/2017: Never used  . omeprazole (PRILOSEC) 40 MG capsule TAKE ONE CAPSULE BY MOUTH DAILY BEFORE SUPPER   . oxymorphone (OPANA) 5 MG tablet Take 5 mg by mouth 3 (three) times daily as needed for pain (max 3 tablets daily). Per Dr Brien Few 06/16/2018: Patient only taking TWICE DAILY  . PARoxetine (PAXIL) 40 MG tablet Take 1 tablet (40 mg total) by mouth every morning.   . rosuvastatin (CRESTOR) 5 MG tablet TAKE 1 TABLET BY MOUTH EVERY DAY    No facility-administered encounter medications on file as of 06/14/2019.     Objective:   Goals Addressed            This Visit's Progress     Patient Stated   . I would like to optimize my diabetes management (pt-stated)       Current Barriers:  . Diabetes: C1EX; complicated by chronic medical conditions including HTN, HLD, most recent A1c 7.8% . Current antihyperglycemic regimen: Ozempic 0.'5mg'$  weekly, Humalog 75.25 mix-45 units in the AM o Plan to transition patient to [place] 30-35 units daily and Ozempic 0.'5mg'$  - Will explore optimizing GLP1,  transition insulin mix to basal - Clinic visit completed with patient on 06/08/19-PAP completed and faxed to [employer] requiring LIS/extra help application-complete with patient via telephone on 06/14/19 . Denies hypoglycemic symptoms; Denies hyperglycemic symptoms . Current meal patterns: o Breakfast: eggs,bacon, toast-coffee (no sugar) o Lunch: sometimes skips, will have a pack of crackers around 3pm o Supper: at 5-6pm chicken/fish, veggie, baked potato (encouraged pt to limit salt/half of potato) o Snacks: not a big snacker o Drinks: avoids sugary drinks, mostly coffee, tea or water - Discovered patient was drinking gingerale  x2 daily at times.  Reviewed sugar content 36gm in 12oz.  Encouraged pt to try diet or bubbly flavored water alternative . Current exercise: attempts to walk 10-60mn per day, limited by back pain, muscle weakness improving . Current blood glucose readings: checks Bgs around 10am-1pm: per glucometer--patient has not checked Bgs consistently.  Readings in the AM 130-200s, Having Bg<70 around 2-3pm when he doesn't eat. o Reported low BG in 30s at the ED last week.  Patient was getting COVID tested and had skipped eating most of the day.  Reviewed hypoglycemia protocol . Cardiovascular risk reduction: o Current hypertensive regimen: hydralazine, labetalol (ACEi induced cough) - Encouraged patient to check BP 3x weekly - He brought in [employer] BP machine  - BP at recent Kidney MD visit was 130/88.  Goal 130/80. Readings 150-160/80-90 on current BP machine - Encouraged low salt diet; discussed foods o Current hyperlipidemia regimen: rosuvastatin 559mqHS (tolerating well) last LDL 100 in 11/2018 o Current antiplatelet regimen: apixaban  o Neuropathy: Patient restarted gabapentin 30058mHS on 05/20/19.  He states he experienced muscle weakness when he stopped taking this medication.  This condition is improving with re-initiation of gabapentin.  Continue current management   Pharmacist Clinical Goal(s):  . OMarland Kitchener the next 90 days, patient will work with PharmD and primary care provider to address related to optimization of medication management of diabetes  Interventions: . Comprehensive medication review performed, medication list updated in electronic medical record . Reviewed & discussed the following diabetes-related information with patient: o Reviewed ADA recommended "diabetes-friendly" diet  (reviewed healthy snack/food options) o Discussed insulin/GLP-1 injection technique; Patient uses One touch Verio glucometer o Reviewed medication purpose/side effects-->patient denies adverse events  Patient  Self Care Activities:  . Patient will check blood glucose daily, document, and provide at future appointments . Patient will focus on medication adherence . Patient will take medications as prescribed . Patient will contact provider with any episodes of hypoglycemia . Patient will report any questions or concerns to provider   Please see past updates related to this goal by clicking on the "Past Updates" button in the selected goal        Plan:   The care management team will reach out to the patient again over the next 14 days.   Provider Signature JulRegina EckharmD, BCPS Clinical Pharmacist, [phone]

## 2019-06-16 ENCOUNTER — Other Ambulatory Visit: Payer: Self-pay

## 2019-06-16 ENCOUNTER — Ambulatory Visit: Payer: HMO | Admitting: Licensed Clinical Social Worker

## 2019-06-16 DIAGNOSIS — I5022 Chronic systolic (congestive) heart failure: Secondary | ICD-10-CM

## 2019-06-16 DIAGNOSIS — Z7189 Other specified counseling: Secondary | ICD-10-CM

## 2019-06-16 DIAGNOSIS — I10 Essential (primary) hypertension: Secondary | ICD-10-CM

## 2019-06-16 NOTE — Patient Instructions (Signed)
Licensed Clinical Social Worker Visit Information Mr. Criado  it was nice speaking with you. Please call me directly if you have questions [phone] Goals we discussed today:  Goals Addressed            This Visit's Progress   . Advance Directives       Current Barriers:  . Patient with CHF and HTN  needs assistance with advance directives . Would like more education about healthcare power of attorney verses power of attorney . Has a document not sure what it covers Clinical Social Work Clinical Goal(s):  Marland Kitchen Over the next 30 days, patient will verbalize basic understanding of Advanced Directives and importance of completion . LCSW will provide explanation of healthcare proxy and living as well as review  forms with expiration of how to fill them   Interventions: . A voluntary discussion about advanced care planning including explanation and discussion of advanced directives discussed with the patient  Patient Self Care Activities related to goal:  . Patient will get a copy of his documents and review them prior to next appointment with LCSW  Initial goal documentation         Materials provided: no Mr. Romney was given information about Care Management services today including:  1. Care Management services include personalized support from designated clinical staff supervised by his physician, including individualized plan of care and coordination with other care providers 2. 24/7 contact [phone] for assistance for urgent and routine care needs. 3. Care Management services at any time by phone call to the office staff.  The patient verbalized understanding of instructions provided today and declined a print copy of patient instruction materials.  Follow up plan:  SW will follow up with patient by phone over the next 2 days  Maurine Cane, LCSW

## 2019-06-16 NOTE — Chronic Care Management (AMB) (Signed)
Care Management   Clinical Social Work General Note  06/16/2019 Name: Anthony [name] MRN: [id number] DOB: 24-Nov-1950  Anthony [name] is a 69 y.o. year old male who is a primary care patient of [name], MD. The Care Management team was consulted to assist the patient with Advanced Directive Education. Patient is currently active with [facility] pharmacy.   Review of patient status, including review of consultants reports, relevant laboratory and other test results, and collaboration with appropriate care team members and the patient's provider was performed as part of comprehensive patient evaluation and provision of chronic care management services.    Outpatient Encounter Medications as of 06/16/2019  Medication Sig Note  . Blood Glucose Monitoring Suppl (ONETOUCH VERIO) w/Device KIT Use kit to check blood sugars.   . calcitRIOL (ROCALTROL) 0.5 MCG capsule Take 0.5 mcg by mouth daily at 12 noon.    Marland Kitchen ELIQUIS 5 MG TABS tablet TAKE 1 TABLET BY MOUTH TWICE A DAY   . furosemide (LASIX) 40 MG tablet Take 1 tablet (40 mg total) by mouth daily. (Patient taking differently: Take 80 mg by mouth daily. ) 06/11/2019: Reports  takes 80 mg daily  . gabapentin (NEURONTIN) 300 MG capsule TAKE ONE CAPSULE BY MOUTH EVERY NIGHT AT BEDTIME   . glucose blood (ONETOUCH VERIO) test strip Use as instructed   . hydrALAZINE (APRESOLINE) 25 MG tablet Take 1 tablet (25 mg total) by mouth 3 (three) times daily.   . Insulin Isophane & Regular Human (NOVOLIN 70/30 FLEXPEN) (70-30) 100 UNIT/ML PEN Inject 50 Units into the skin daily.   . Insulin Pen Needle (B-D ULTRAFINE III SHORT PEN) 31G X 8 MM MISC 1 UNITS BY DOES NOT APPLY ROUTE AS NEEDED.   Marland Kitchen KLOR-CON M20 20 MEQ tablet TAKE 1 TABLET BY MOUTH TWICE A DAY   . labetalol (NORMODYNE) 200 MG tablet TAKE 2 TABLETS BY MOUTH TIMES DAILY.   Marland Kitchen Lancets (ONETOUCH ULTRASOFT) lancets Use as instructed   . Multiple Vitamin (MULTIVITAMIN) tablet Take 1 tablet by mouth daily.     .  nitroGLYCERIN (NITROSTAT) 0.4 MG SL tablet Place 0.4 mg under the tongue every 5 (five) minutes as needed for chest pain. If no relief by 3rd tab, call 911  08/20/2017: Never used  . omeprazole (PRILOSEC) 40 MG capsule TAKE ONE CAPSULE BY MOUTH DAILY BEFORE SUPPER   . oxymorphone (OPANA) 5 MG tablet Take 5 mg by mouth 3 (three) times daily as needed for pain (max 3 tablets daily). Per Dr Brien Few 06/16/2018: Patient only taking TWICE DAILY  . PARoxetine (PAXIL) 40 MG tablet Take 1 tablet (40 mg total) by mouth every morning.   . rosuvastatin (CRESTOR) 5 MG tablet TAKE 1 TABLET BY MOUTH EVERY DAY    No facility-administered encounter medications on file as of 06/16/2019.    SDOH (Social Determinants of Health) screening performed today: Advanced Directives. See Care Plan for related entries.   Goals Addressed            This Visit's Progress   . Advance Directives       Current Barriers:  . Marland KitchenPatient with CHF and HTN  needs assistance with advance directives . Would like more education about healthcare power of attorney verses power of attorney . Has a document not sure what it covers Clinical Social Work Clinical Goal(s):  Marland Kitchen Over the next 30 days, patient will verbalize basic understanding of Advanced Directives and importance of completion . LCSW will provide explanation of healthcare  proxy and living as well as review  forms with expiration of how to fill them   Interventions: . A voluntary discussion about advanced care planning including explanation and discussion of advanced directives discussed with the patient  Patient Self Care Activities related to goal:  . Patient will get a copy of his documents and review them prior to next appointment with LCSW  Initial goal documentation       Follow Up Plan: LCSW will schedule follow up phone appointment with patient after her reviews his current document.  Appointment scheduled 06/18/19      Casimer Lanius, LCSW Clinical Social Worker  [place] / [place]   [phone] 11:22 AM

## 2019-06-18 ENCOUNTER — Ambulatory Visit: Payer: Self-pay | Admitting: Licensed Clinical Social Worker

## 2019-06-18 ENCOUNTER — Telehealth: Payer: HMO

## 2019-06-18 ENCOUNTER — Other Ambulatory Visit: Payer: Self-pay

## 2019-06-18 NOTE — Chronic Care Management (AMB) (Signed)
    Clinical Social Work  Care Management Outreach   06/18/2019 Name: Anthony [name] MRN: [id number] DOB: [birth year]  Anthony [name] is a 69 y.o. year old male who is a primary care patient of [name], MD . The Care Management team was consulted for assistance with Advanced Directive Education.   Today is the second phone appointment with Anthony [name] to review and provided education on advance directives. Reports he has not had time to review the information and would like to reschedule the phone appointment again.  Follow Up Plan:  1. Patient was given contact information for LCSW.   2. He will call when he is ready to review and completed advance directives 3. No further follow up by LCSW at this time  Anthony [name], [place] / [place]   [phone] 2:08 PM

## 2019-06-20 ENCOUNTER — Other Ambulatory Visit: Payer: Self-pay | Admitting: Cardiology

## 2019-06-24 ENCOUNTER — Ambulatory Visit: Payer: Self-pay | Admitting: Licensed Clinical Social Worker

## 2019-06-24 NOTE — Chronic Care Management (AMB) (Signed)
    Clinical Social Work  Care Management Outreach   06/24/2019 Name: Anthony [name] MRN: [id number] DOB: February 06, 1951  [name] is a 69 y.o. year old male who is a primary care patient of [name], MD . The Care Management team was consulted for assistance with Advanced Directive Education.  Patient left voice message for LCSW wanting to schedule time to review his documents and advance directive information.   LCSW reached out to [name] today by phone  Follow Up Plan: Phone appointment scheduled with LCSW on 06/25/19   Casimer Lanius, [place] / [place]   [phone] 1:45 PM

## 2019-06-25 ENCOUNTER — Other Ambulatory Visit: Payer: Self-pay

## 2019-06-25 ENCOUNTER — Ambulatory Visit: Payer: HMO | Admitting: Licensed Clinical Social Worker

## 2019-06-25 DIAGNOSIS — I10 Essential (primary) hypertension: Secondary | ICD-10-CM

## 2019-06-25 DIAGNOSIS — E119 Type 2 diabetes mellitus without complications: Secondary | ICD-10-CM

## 2019-06-25 DIAGNOSIS — Z7189 Other specified counseling: Secondary | ICD-10-CM

## 2019-06-25 NOTE — Patient Instructions (Addendum)
Licensed Clinical Social Worker Visit Information Mr. Tallarico  it was nice speaking with you. Please call me directly if you have questions [phone] Goals we discussed today:  Goals Addressed            This Visit's Progress   . Advance Directives   On track    Current Barriers:  Marland Kitchen Knowledge Deficits and Care Coordination needs related to Advanced Directives for patient with HTN and DMII   . Limited education about the importance of naming a healthcare power of attorney .  Clinical Social Work Clinical Goal(s):  Marland Kitchen Over the next 20 days, the patient will review mailed EMMI education on Advance Directive as evidenced by patient self report of review . Over the next 45 days, the patient will  work with care management team on completion of Advance Directive  . Over the next 45 days, the patient will have Advance Directive notarized and provide a copy to provider office  . Interventions provided by LCSW: . Mailed the patient an EMMI educational handout on Advance Directives as well as an Emergency planning/management officer . Advised patient to review information mailed by LCSW . A voluntary discussion about advanced care planning including explanation and discussion of advanced directives, healthcare proxy and living will was discussed with the patient.  . Reviewed packet and provided explanation of how to fill them out .    Marland Kitchen Patient Self Care Activities:  . Is able to complete documentation independently . Able to identify next of kin or power or attorney  Please see past updates related to this goal by clicking on the "Past Updates" button in the selected goal       Materials provided: Yes: advance directives information  Mr. Thomure was given information about Care Management services today including:  1. Care Management services include personalized support from designated clinical staff supervised by his physician, including individualized plan of care and coordination with other care  providers 2. 24/7 contact [phone] for assistance for urgent and routine care needs. 3. Care Management services at any time by phone call to the office staff.  The patient verbalized understanding of instructions provided today and declined a print copy of patient instruction materials.  Follow up plan:  SW will follow up with patient by phone over the next 2 weeks  Maurine Cane, LCSW

## 2019-06-25 NOTE — Chronic Care Management (AMB) (Signed)
Social Work Care Management  Follow Up   06/25/2019 Name: Anthony [name] MRN: [id number] DOB: [birth year]  Referred by: [name], MD Reason for referral : Care Coordination (advance Directive)  Anthony [name] is a 69 y.o. year old male who is a primary care patient of [name], [name], MD.  Reason for follow-up: Phone encounter with patient today for ongoing assessment and brief interventions to assist with care coordination needs related to Advance Directives education .   Intervention: SDOH (Social Determinants of Health) screening and general assessment completed today: no needs identified in this encounter.  Advanced Directives: See Care Plan for related entries.   Review of patient status, including review of consultants reports, relevant laboratory and other test results, and collaboration with appropriate care team members and the patient's provider was performed as part of comprehensive patient evaluation and provision of chronic care management services.    Outpatient Encounter Medications as of 06/25/2019  Medication Sig Note  . Blood Glucose Monitoring Suppl (ONETOUCH VERIO) w/Device KIT Use kit to check blood sugars.   . calcitRIOL (ROCALTROL) 0.5 MCG capsule Take 0.5 mcg by mouth daily at 12 noon.    Marland Kitchen ELIQUIS 5 MG TABS tablet TAKE 1 TABLET BY MOUTH TWICE A DAY   . furosemide (LASIX) 40 MG tablet Take 1 tablet (40 mg total) by mouth daily. (Patient taking differently: Take 80 mg by mouth daily. ) 06/11/2019: Reports  takes 80 mg daily  . gabapentin (NEURONTIN) 300 MG capsule TAKE ONE CAPSULE BY MOUTH EVERY NIGHT AT BEDTIME   . glucose blood (ONETOUCH VERIO) test strip Use as instructed   . hydrALAZINE (APRESOLINE) 25 MG tablet Take 1 tablet (25 mg total) by mouth 3 (three) times daily.   . Insulin Isophane & Regular Human (NOVOLIN 70/30 FLEXPEN) (70-30) 100 UNIT/ML PEN Inject 50 Units into the skin daily.   . Insulin Pen Needle (B-D ULTRAFINE III SHORT PEN) 31G X 8 MM MISC 1  UNITS BY DOES NOT APPLY ROUTE AS NEEDED.   Marland Kitchen KLOR-CON M20 20 MEQ tablet TAKE 1 TABLET BY MOUTH TWICE A DAY   . labetalol (NORMODYNE) 200 MG tablet TAKE 2 TABLETS BY MOUTH TIMES DAILY.   Marland Kitchen Lancets (ONETOUCH ULTRASOFT) lancets Use as instructed   . Multiple Vitamin (MULTIVITAMIN) tablet Take 1 tablet by mouth daily.     . nitroGLYCERIN (NITROSTAT) 0.4 MG SL tablet Place 0.4 mg under the tongue every 5 (five) minutes as needed for chest pain. If no relief by 3rd tab, call 911  08/20/2017: Never used  . omeprazole (PRILOSEC) 40 MG capsule TAKE ONE CAPSULE BY MOUTH DAILY BEFORE SUPPER   . oxymorphone (OPANA) 5 MG tablet Take 5 mg by mouth 3 (three) times daily as needed for pain (max 3 tablets daily). Per Dr Brien Few 06/16/2018: Patient only taking TWICE DAILY  . PARoxetine (PAXIL) 40 MG tablet Take 1 tablet (40 mg total) by mouth every morning.   . rosuvastatin (CRESTOR) 5 MG tablet TAKE 1 TABLET BY MOUTH EVERY DAY    No facility-administered encounter medications on file as of 06/25/2019.    Goals Addressed            This Visit's Progress   . Advance Directives   On track    Current Barriers:  Marland Kitchen Knowledge Deficits and Care Coordination needs related to Advanced Directives for patient with HTN and DMII   . Limited education about the importance of naming a healthcare power of attorney .  Clinical Social Work Clinical Goal(s):  Marland Kitchen Over the next 20 days, the patient will review mailed EMMI education on Advance Directive as evidenced by patient self report of review . Over the next 45 days, the patient will  work with care management team on completion of Advance Directive  . Over the next 45 days, the patient will have Advance Directive notarized and provide a copy to provider office  . Interventions provided by LCSW: . Mailed the patient an EMMI educational handout on Advance Directives as well as an Emergency planning/management officer . Advised patient to review information mailed by LCSW . A voluntary  discussion about advanced care planning including explanation and discussion of advanced directives, healthcare proxy and living will was discussed with the patient.  . Reviewed packet and provided explanation of how to fill them out .    Marland Kitchen Patient Self Care Activities:  . Is able to complete documentation independently . Able to identify next of kin or power or attorney  Please see past updates related to this goal by clicking on the "Past Updates" button in the selected goal       Plan:  1. Advance Directive Packet mailed to patient. 2. LCSW will follow-up by phone in 2 weeks after patient has reviewed packet  Casimer Lanius, [place] / [place]   [phone] 2:43 PM

## 2019-06-29 ENCOUNTER — Ambulatory Visit: Payer: HMO | Admitting: Pharmacist

## 2019-06-29 ENCOUNTER — Other Ambulatory Visit: Payer: Self-pay

## 2019-06-29 DIAGNOSIS — E119 Type 2 diabetes mellitus without complications: Secondary | ICD-10-CM

## 2019-06-29 NOTE — Progress Notes (Signed)
Chronic Care Management   Visit Note  06/29/2019 Name: Anthony [name] MRN: [id number] DOB: 08/10/1950  Referred by: Kim, Rachel E, MD Reason for referral : Chronic Care Management (Diabetes)   Anthony [name] is a 69 y.o. year old male who is a primary care patient of Kim, Rachel E, MD. The CCM team was consulted for assistance with chronic disease management and care coordination needs related to DMII  Review of patient status, including review of consultants reports, relevant laboratory and other test results, and collaboration with appropriate care team members and the patient's provider was performed as part of comprehensive patient evaluation and provision of chronic care management services.    Medications: Outpatient Encounter Medications as of 06/29/2019  Medication Sig Note  . Blood Glucose Monitoring Suppl (ONETOUCH VERIO) w/Device KIT Use kit to check blood sugars.   . calcitRIOL (ROCALTROL) 0.5 MCG capsule Take 0.5 mcg by mouth daily at 12 noon.    . ELIQUIS 5 MG TABS tablet TAKE 1 TABLET BY MOUTH TWICE A DAY   . furosemide (LASIX) 40 MG tablet Take 1 tablet (40 mg total) by mouth daily. (Patient taking differently: Take 80 mg by mouth daily. ) 06/11/2019: Reports  takes 80 mg daily  . gabapentin (NEURONTIN) 300 MG capsule TAKE ONE CAPSULE BY MOUTH EVERY NIGHT AT BEDTIME   . glucose blood (ONETOUCH VERIO) test strip Use as instructed   . hydrALAZINE (APRESOLINE) 25 MG tablet Take 1 tablet (25 mg total) by mouth 3 (three) times daily.   . Insulin Isophane & Regular Human (NOVOLIN 70/30 FLEXPEN) (70-30) 100 UNIT/ML PEN Inject 50 Units into the skin daily.   . Insulin Pen Needle (B-D ULTRAFINE III SHORT PEN) 31G X 8 MM MISC 1 UNITS BY DOES NOT APPLY ROUTE AS NEEDED.   . KLOR-CON M20 20 MEQ tablet TAKE 1 TABLET BY MOUTH TWICE A DAY   . labetalol (NORMODYNE) 200 MG tablet TAKE 2 TABLETS BY MOUTH TIMES DAILY.   . Lancets (ONETOUCH ULTRASOFT) lancets Use as instructed   . Multiple  Vitamin (MULTIVITAMIN) tablet Take 1 tablet by mouth daily.     . nitroGLYCERIN (NITROSTAT) 0.4 MG SL tablet Place 0.4 mg under the tongue every 5 (five) minutes as needed for chest pain. If no relief by 3rd tab, call 911  08/20/2017: Never used  . omeprazole (PRILOSEC) 40 MG capsule TAKE ONE CAPSULE BY MOUTH DAILY BEFORE SUPPER   . oxymorphone (OPANA) 5 MG tablet Take 5 mg by mouth 3 (three) times daily as needed for pain (max 3 tablets daily). Per Dr Bartko 06/16/2018: Patient only taking TWICE DAILY  . PARoxetine (PAXIL) 40 MG tablet Take 1 tablet (40 mg total) by mouth every morning.   . rosuvastatin (CRESTOR) 5 MG tablet TAKE 1 TABLET BY MOUTH EVERY DAY    No facility-administered encounter medications on file as of 06/29/2019.     Objective:   Goals Addressed            This Visit's Progress     Patient Stated   . I would like to optimize my diabetes management (pt-stated)       Current Barriers:  . Diabetes: T2DM; complicated by chronic medical conditions including HTN, HLD, most recent A1c 7.8% . Current antihyperglycemic regimen: Ozempic 0.5mg weekly, Tresiba 30 units o Plan to transition patient to Tresiba 30-35 units daily and Ozempic 0.5mg weekly - Will explore optimizing GLP1, transition insulin mix to basal - Ozempic sample given-1 box, LOT [id number],   EXP 05/2021 - TRESIBA sample given-1 pen/box, LOT [id number]; exp 03/2021 - Novo requiring LIS/extra help application-complete with patient via telephone on 06/14/19 . SS office called patient, but we had to leave VM with DSS . Reports hypoglycemic symptoms--shaky, sweaty around 2-3pm, this is like due to humalog 75/25 mix--d/c'd--starting tresiba; Denies hyperglycemic symptoms . Current meal patterns: o Breakfast: eggs,bacon, toast-coffee (no sugar) o Lunch: sometimes skips, will have a pack of crackers around 3pm o Supper: at 5-6pm chicken/fish, veggie, baked potato (encouraged pt to limit salt/half of potato) o Snacks: not a big  snacker o Drinks: avoids sugary drinks, mostly coffee, tea or water - Discovered patient was drinking gingerale x2 daily at times.  Reviewed sugar content 36gm in 12oz.  Encouraged pt to try diet or bubbly flavored water alternative . Current exercise: attempts to walk 10-15min per day, limited by back pain, muscle weakness improving . Current blood glucose readings: checks Bgs around 10am-1pm: per glucometer--patient has not checked Bgs consistently.  Readings in the AM 130-200s, Having Bg<70 around 2-3pm when he doesn't eat. o Reported low BG in 30s at the ED last week.  Patient was getting COVID tested and had skipped eating most of the day.  Reviewed hypoglycemia protocol . Cardiovascular risk reduction: o Current hypertensive regimen: hydralazine, labetalol (ACEi induced cough) - Encouraged patient to check BP 3x weekly - He brought in Omron BP machine  - BP at recent Kidney MD visit was 130/88.  Goal 130/80. Readings 150-160/80-90 on current BP machine - Encouraged low salt diet; discussed foods o Current hyperlipidemia regimen: rosuvastatin 5mg qHS (tolerating well) last LDL 100 in 11/2018 o Current antiplatelet regimen: apixaban  o Neuropathy: Patient restarted gabapentin 300mg qHS on 05/20/19.  He states he experienced muscle weakness when he stopped taking this medication.  This condition is improving with re-initiation of gabapentin.  Continue current management   Pharmacist Clinical Goal(s):  . Over the next 90 days, patient will work with PharmD and primary care provider to address related to optimization of medication management of diabetes  Interventions: . Comprehensive medication review performed, medication list updated in electronic medical record . Reviewed & discussed the following diabetes-related information with patient: o Reviewed ADA recommended "diabetes-friendly" diet  (reviewed healthy snack/food options) o Discussed insulin/GLP-1 injection technique; Patient uses  One touch Verio glucometer o Reviewed medication purpose/side effects-->patient denies adverse events  Patient Self Care Activities:  . Patient will check blood glucose daily, document, and provide at future appointments . Patient will focus on medication adherence . Patient will take medications as prescribed . Patient will contact provider with any episodes of hypoglycemia . Patient will report any questions or concerns to provider   Please see past updates related to this goal by clicking on the "Past Updates" button in the selected goal         Plan:   The care management team will reach out to the patient again over the next 14 days.   Provider Signature [name] Dattero Pruitt, PharmD, BCPS Clinical Pharmacist, [facility] [place]  [facility]  Direct Dial: [phone]     

## 2019-07-02 ENCOUNTER — Ambulatory Visit: Payer: Self-pay | Admitting: Pharmacist

## 2019-07-02 DIAGNOSIS — E119 Type 2 diabetes mellitus without complications: Secondary | ICD-10-CM

## 2019-07-05 NOTE — Progress Notes (Signed)
Chronic Care Management   Visit Note  07/02/2019 Name: Anthony [name] MRN: [id number] DOB: 12/20/1950  Referred by: [name], MD Reason for referral : Chronic Care Management   Anthony [name] is a 69 y.o. year old male who is a primary care patient of [name], [name], MD. The CCM team was consulted for assistance with chronic disease management and care coordination needs related to DMII  Review of patient status, including review of consultants reports, relevant laboratory and other test results, and collaboration with appropriate care team members and the patient's provider was performed as part of comprehensive patient evaluation and provision of chronic care management services.    Patient was contacted via telephone for today's visit regarding his diabetes and chronic care management  Medications: Outpatient Encounter Medications as of 07/02/2019  Medication Sig Note  . Blood Glucose Monitoring Suppl (ONETOUCH VERIO) w/Device KIT Use kit to check blood sugars.   . calcitRIOL (ROCALTROL) 0.5 MCG capsule Take 0.5 mcg by mouth daily at 12 noon.    Marland Kitchen ELIQUIS 5 MG TABS tablet TAKE 1 TABLET BY MOUTH TWICE A DAY   . furosemide (LASIX) 40 MG tablet Take 1 tablet (40 mg total) by mouth daily. (Patient taking differently: Take 80 mg by mouth daily. ) 06/11/2019: Reports  takes 80 mg daily  . gabapentin (NEURONTIN) 300 MG capsule TAKE ONE CAPSULE BY MOUTH EVERY NIGHT AT BEDTIME   . glucose blood (ONETOUCH VERIO) test strip Use as instructed   . hydrALAZINE (APRESOLINE) 25 MG tablet Take 1 tablet (25 mg total) by mouth 3 (three) times daily.   . Insulin Degludec (TRESIBA FLEXTOUCH) 200 UNIT/ML SOPN Inject 30 Units into the skin daily.   . Insulin Pen Needle (B-D ULTRAFINE III SHORT PEN) 31G X 8 MM MISC 1 UNITS BY DOES NOT APPLY ROUTE AS NEEDED.   Marland Kitchen KLOR-CON M20 20 MEQ tablet TAKE 1 TABLET BY MOUTH TWICE A DAY   . labetalol (NORMODYNE) 200 MG tablet TAKE 2 TABLETS BY MOUTH TIMES DAILY.   Marland Kitchen  Lancets (ONETOUCH ULTRASOFT) lancets Use as instructed   . Multiple Vitamin (MULTIVITAMIN) tablet Take 1 tablet by mouth daily.     . nitroGLYCERIN (NITROSTAT) 0.4 MG SL tablet Place 0.4 mg under the tongue every 5 (five) minutes as needed for chest pain. If no relief by 3rd tab, call 911  08/20/2017: Never used  . omeprazole (PRILOSEC) 40 MG capsule TAKE ONE CAPSULE BY MOUTH DAILY BEFORE SUPPER   . oxymorphone (OPANA) 5 MG tablet Take 5 mg by mouth 3 (three) times daily as needed for pain (max 3 tablets daily). Per Dr Brien Few 06/16/2018: Patient only taking TWICE DAILY  . PARoxetine (PAXIL) 40 MG tablet Take 1 tablet (40 mg total) by mouth every morning.   . rosuvastatin (CRESTOR) 5 MG tablet TAKE 1 TABLET BY MOUTH EVERY DAY   . Semaglutide,0.25 or 0.5MG/DOS, (OZEMPIC, 0.25 OR 0.5 MG/DOSE,) 2 MG/1.5ML SOPN Inject 0.5 mg into the skin once a week.    No facility-administered encounter medications on file as of 07/02/2019.     Objective:   Goals Addressed            This Visit's Progress     Patient Stated   . I would like to optimize my diabetes management (pt-stated)       Current Barriers:  . Diabetes: O3JK; complicated by chronic medical conditions including HTN, HLD, most recent A1c 7.8% . Current antihyperglycemic regimen: Ozempic 0.51m weekly, TTyler Aas  30 units o Plan to transition patient to [place] 40 units daily and Ozempic 0.4m weekly - Will explore optimizing GLP1, transition insulin mix to basal - Ozempic sample given-1 box, LOT [id number] EXP 05/2021 - TRESIBA sample given-1 pen/box, LOT [id number] exp 03/2021 - Novo requiring LIS/extra help application-complete with patient via telephone on 06/14/19 . SS office called patient, but we had to leave VM with DSS . Reports hypoglycemic symptoms--shaky, sweaty around 2-3pm, this is like due to humalog 75/25 mix--d/c'd--starting tresiba; Denies hyperglycemic symptoms . Current meal patterns: o Breakfast: eggs,bacon, toast-coffee (no  sugar) o Lunch: sometimes skips, will have a pack of crackers around 3pm o Supper: at 5-6pm chicken/fish, veggie, baked potato (encouraged pt to limit salt/half of potato) o Snacks: not a big snacker o Drinks: avoids sugary drinks, mostly coffee, tea or water - Discovered patient was drinking gingerale x2 daily at times.  Reviewed sugar content 36gm in 12oz.  Encouraged pt to try diet or bubbly flavored water alternative . Current exercise: attempts to walk 10-114m per day, limited by back pain, muscle weakness improving . Current blood glucose readings: checks Bgs around 10am-1pm: per glucometer--patient has not checked Bgs consistently.  Readings in the AM 130-200s, Having Bg<70 around 2-3pm when he doesn't eat. o Reported low BG in 30s at the ED last week.  Patient was getting COVID tested and had skipped eating most of the day.  Reviewed hypoglycemia protocol . Cardiovascular risk reduction: o Current hypertensive regimen: hydralazine, labetalol (ACEi induced cough) - Encouraged patient to check BP 3x weekly - He brought in Omron BP machine  - BP at recent Kidney MD visit was 130/88.  Goal 130/80. Readings 150-160/80-90 on current BP machine - Encouraged low salt diet; discussed foods o Current hyperlipidemia regimen: rosuvastatin 28m43mHS (tolerating well) last LDL 100 in 11/2018 o Current antiplatelet regimen: apixaban  o Neuropathy: Patient restarted gabapentin 300m83mS on 05/20/19.  He states he experienced muscle weakness when he stopped taking this medication.  This condition is improving with re-initiation of gabapentin.  Continue current management   Pharmacist Clinical Goal(s):  . OvMarland Kitchenr the next 90 days, patient will work with PharmD and primary care provider to address related to optimization of medication management of diabetes  Interventions: . Comprehensive medication review performed, medication list updated in electronic medical record . Reviewed & discussed the following  diabetes-related information with patient: o Reviewed ADA recommended "diabetes-friendly" diet  (reviewed healthy snack/food options) o Discussed insulin/GLP-1 injection technique; Patient uses One touch Verio glucometer o Reviewed medication purpose/side effects-->patient denies adverse events  Patient Self Care Activities:  . Patient will check blood glucose daily, document, and provide at future appointments . Patient will focus on medication adherence . Patient will take medications as prescribed . Patient will contact provider with any episodes of hypoglycemia . Patient will report any questions or concerns to provider   Please see past updates related to this goal by clicking on the "Past Updates" button in the selected goal         Plan:   The care management team will reach out to the patient again over the next 30 days.   Provider Signature JuliRegina EckarmD, BCPS Clinical Pharmacist, [phone]

## 2019-07-06 ENCOUNTER — Ambulatory Visit: Payer: Self-pay | Admitting: Pharmacist

## 2019-07-06 DIAGNOSIS — E119 Type 2 diabetes mellitus without complications: Secondary | ICD-10-CM

## 2019-07-06 MED ORDER — TRESIBA FLEXTOUCH 200 UNIT/ML ~~LOC~~ SOPN: 42.0000 [IU] | PEN_INJECTOR | Freq: Every day | SUBCUTANEOUS | 4 refills | Status: DC

## 2019-07-06 NOTE — Progress Notes (Signed)
Chronic Care Management   Visit Note  07/06/2019 Name: Anthony [name] MRN: [id number] DOB: 1951/02/05  Referred by: Anthony Oliphant, MD Reason for referral : Chronic Care Management (Diabetes)   Anthony [name] is a 69 y.o. year old male who is a primary care patient of Anthony [name], Anthony Quale, MD. The CCM team was consulted for assistance with chronic disease management and care coordination needs related to DMII  Review of patient status, including review of consultants reports, relevant laboratory and other test results, and collaboration with appropriate care team members and the patient's provider was performed as part of comprehensive patient evaluation and provision of chronic care management services.     Medications: Outpatient Encounter Medications as of 07/06/2019  Medication Sig Note  . Blood Glucose Monitoring Suppl (ONETOUCH VERIO) w/Device KIT Use kit to check blood sugars.   . calcitRIOL (ROCALTROL) 0.5 MCG capsule Take 0.5 mcg by mouth daily at 12 noon.    Marland Kitchen ELIQUIS 5 MG TABS tablet TAKE 1 TABLET BY MOUTH TWICE A DAY   . furosemide (LASIX) 40 MG tablet Take 1 tablet (40 mg total) by mouth daily. (Patient taking differently: Take 80 mg by mouth daily. ) 06/11/2019: Reports  takes 80 mg daily  . gabapentin (NEURONTIN) 300 MG capsule TAKE ONE CAPSULE BY MOUTH EVERY NIGHT AT BEDTIME   . glucose blood (ONETOUCH VERIO) test strip Use as instructed   . hydrALAZINE (APRESOLINE) 25 MG tablet Take 1 tablet (25 mg total) by mouth 3 (three) times daily.   . Insulin Degludec (TRESIBA FLEXTOUCH) 200 UNIT/ML SOPN Inject 30 Units into the skin daily.   . Insulin Pen Needle (B-D ULTRAFINE III SHORT PEN) 31G X 8 MM MISC 1 UNITS BY DOES NOT APPLY ROUTE AS NEEDED.   Marland Kitchen KLOR-CON M20 20 MEQ tablet TAKE 1 TABLET BY MOUTH TWICE A DAY   . labetalol (NORMODYNE) 200 MG tablet TAKE 2 TABLETS BY MOUTH TIMES DAILY.   Marland Kitchen Lancets (ONETOUCH ULTRASOFT) lancets Use as instructed   . Multiple Vitamin (MULTIVITAMIN) tablet  Take 1 tablet by mouth daily.     . nitroGLYCERIN (NITROSTAT) 0.4 MG SL tablet Place 0.4 mg under the tongue every 5 (five) minutes as needed for chest pain. If no relief by 3rd tab, call 911  08/20/2017: Never used  . omeprazole (PRILOSEC) 40 MG capsule TAKE ONE CAPSULE BY MOUTH DAILY BEFORE SUPPER   . oxymorphone (OPANA) 5 MG tablet Take 5 mg by mouth 3 (three) times daily as needed for pain (max 3 tablets daily). Per Dr Brien Few 06/16/2018: Patient only taking TWICE DAILY  . PARoxetine (PAXIL) 40 MG tablet Take 1 tablet (40 mg total) by mouth every morning.   . rosuvastatin (CRESTOR) 5 MG tablet TAKE 1 TABLET BY MOUTH EVERY DAY   . Semaglutide,0.25 or 0.5MG/DOS, (OZEMPIC, 0.25 OR 0.5 MG/DOSE,) 2 MG/1.5ML SOPN Inject 0.5 mg into the skin once a week.    No facility-administered encounter medications on file as of 07/06/2019.     Objective:   Goals Addressed            This Visit's Progress     Patient Stated   . I would like to optimize my diabetes management (pt-stated)       Current Barriers:  . Diabetes: K5VV; complicated by chronic medical conditions including HTN, HLD, most recent A1c 7.8% . Current antihyperglycemic regimen: Ozempic 0.79m weekly, Tresiba 42 units - Will continue to optimize GLP1 - Ozempic sample given-1 box, LOT  [id number], EXP 05/2021 - TRESIBA sample given-1 pen/box, LOT [id number]; exp 03/2021 - Novo requiring LIS/extra help application-complete with patient via telephone on 06/14/19 . SS office called patient, but we had to leave VM with DSS x2 . Must transition GLP1 to Trulicity-->Application to be submitted for Trulicity via [facility] due to novo nordisk issues (unable to obtain Ozempic) . Will have Tyler Aas called into pharmacy since $0 copay on current insurance . Denies Hypogylcemia now that he is on basal insulin; Denies hyperglycemic symptoms . Current meal patterns: o Breakfast: eggs,bacon, toast-coffee (no sugar) o Lunch: sometimes skips, will have a pack of  crackers around 3pm o Supper: at 5-6pm chicken/fish, veggie, baked potato (encouraged pt to limit salt/half of potato) o Snacks: not a big snacker o Drinks: avoids sugary drinks, mostly coffee, tea or water - Discovered patient was drinking gingerale x2 daily at times.  Reviewed sugar content 36gm in 12oz.  Encouraged pt to try diet or bubbly flavored water alternative . Current exercise: attempts to walk 10-2mn per day, limited by back pain, muscle weakness improving . Current blood glucose readings: checks Bgs around 10am-1pm: per glucometer--patient has not checked Bgs consistently.  Readings in the AM 130-180 (will increase insulin) . Cardiovascular risk reduction: o Current hypertensive regimen: hydralazine, labetalol (ACEi induced cough) - Encouraged patient to check BP 3x weekly - He brought in Omron BP machine  - BP at recent Kidney MD visit was 130/88.  Goal 130/80. Readings 150-160/80-90 on current BP machine - Encouraged low salt diet; discussed foods o Current hyperlipidemia regimen: rosuvastatin 578mqHS (tolerating well) last LDL 100 in 11/2018 o Current antiplatelet regimen: apixaban  o Neuropathy: Patient restarted gabapentin 30028mHS on 05/20/19.  He states he experienced muscle weakness when he stopped taking this medication.  This condition is improving with re-initiation of gabapentin.  Continue current management   Pharmacist Clinical Goal(s):  . OMarland Kitchener the next 90 days, patient will work with PharmD and primary care provider to address related to optimization of medication management of diabetes  Interventions: . Comprehensive medication review performed, medication list updated in electronic medical record . Reviewed & discussed the following diabetes-related information with patient: o Reviewed ADA recommended "diabetes-friendly" diet  (reviewed healthy snack/food options) o Discussed insulin/GLP-1 injection technique; Patient uses One touch Verio  glucometer o Reviewed medication purpose/side effects-->patient denies adverse events  Patient Self Care Activities:  . Patient will check blood glucose daily, document, and provide at future appointments . Patient will focus on medication adherence . Patient will take medications as prescribed . Patient will contact provider with any episodes of hypoglycemia . Patient will report any questions or concerns to provider   Please see past updates related to this goal by clicking on the "Past Updates" button in the selected goal          Plan:   The care management team will reach out to the patient again over the next 7 days.   Provider Signature JulRegina EckharmD, BCPS Clinical Pharmacist, [phone]

## 2019-07-07 DIAGNOSIS — Z961 Presence of intraocular lens: Secondary | ICD-10-CM | POA: Diagnosis not present

## 2019-07-07 DIAGNOSIS — H524 Presbyopia: Secondary | ICD-10-CM | POA: Diagnosis not present

## 2019-07-07 DIAGNOSIS — E113393 Type 2 diabetes mellitus with moderate nonproliferative diabetic retinopathy without macular edema, bilateral: Secondary | ICD-10-CM | POA: Diagnosis not present

## 2019-07-07 LAB — HM DIABETES EYE EXAM

## 2019-07-08 ENCOUNTER — Other Ambulatory Visit: Payer: Self-pay | Admitting: Pharmacy Technician

## 2019-07-08 NOTE — Patient Outreach (Signed)
[facility]) Care Management  07/08/2019  LEANDRE WIEN 10/15/1950 [id number]   Incoming message from [place] requesting that provider portion of Trulicity application thru [employer] be faxed to Dr. [name] for completion.  [name] [place] Certified Pharmacy Technician [place] Management Direct [phone]

## 2019-07-09 ENCOUNTER — Ambulatory Visit: Payer: HMO | Admitting: Licensed Clinical Social Worker

## 2019-07-09 ENCOUNTER — Other Ambulatory Visit: Payer: Self-pay

## 2019-07-09 DIAGNOSIS — I5022 Chronic systolic (congestive) heart failure: Secondary | ICD-10-CM

## 2019-07-09 DIAGNOSIS — Z7189 Other specified counseling: Secondary | ICD-10-CM

## 2019-07-09 DIAGNOSIS — I10 Essential (primary) hypertension: Secondary | ICD-10-CM

## 2019-07-09 NOTE — Chronic Care Management (AMB) (Signed)
Social Work Care Management  Follow Up   07/09/2019 Name: Anthony [name] MRN: [id number] DOB: Oct 19, 1950  Referred by: [name], MD Reason for referral : Care Coordination (advance directives)  Anthony [name] is a 69 y.o. year old male who is a primary care patient of [name], [name], MD.  Reason for follow-up: Phone encounter with patient today for ongoing assessment and brief interventions to assist with care coordination needs for Advance Directives.   Intervention: SDOH (Social Determinants of Health) screening and general assessment of needs completed today. Continued interventions provided with Advanced Directives.  See Care Plan for related entries.   Review of patient status, including review of consultants reports, relevant laboratory and other test results, and collaboration with appropriate care team members and the patient's provider was performed as part of comprehensive patient evaluation and provision of chronic care management services.   Outpatient Encounter Medications as of 07/09/2019  Medication Sig Note  . Blood Glucose Monitoring Suppl (ONETOUCH VERIO) w/Device KIT Use kit to check blood sugars.   . calcitRIOL (ROCALTROL) 0.5 MCG capsule Take 0.5 mcg by mouth daily at 12 noon.    Marland Kitchen ELIQUIS 5 MG TABS tablet TAKE 1 TABLET BY MOUTH TWICE A DAY   . furosemide (LASIX) 40 MG tablet Take 1 tablet (40 mg total) by mouth daily. (Patient taking differently: Take 80 mg by mouth daily. ) 06/11/2019: Reports  takes 80 mg daily  . gabapentin (NEURONTIN) 300 MG capsule TAKE ONE CAPSULE BY MOUTH EVERY NIGHT AT BEDTIME   . glucose blood (ONETOUCH VERIO) test strip Use as instructed   . hydrALAZINE (APRESOLINE) 25 MG tablet Take 1 tablet (25 mg total) by mouth 3 (three) times daily.   . Insulin Degludec (TRESIBA FLEXTOUCH) 200 UNIT/ML SOPN Inject 42 Units into the skin daily.   . Insulin Pen Needle (B-D ULTRAFINE III SHORT PEN) 31G X 8 MM MISC 1 UNITS BY DOES NOT APPLY ROUTE AS NEEDED.     Marland Kitchen KLOR-CON M20 20 MEQ tablet TAKE 1 TABLET BY MOUTH TWICE A DAY   . labetalol (NORMODYNE) 200 MG tablet TAKE 2 TABLETS BY MOUTH TIMES DAILY.   Marland Kitchen Lancets (ONETOUCH ULTRASOFT) lancets Use as instructed   . Multiple Vitamin (MULTIVITAMIN) tablet Take 1 tablet by mouth daily.     . nitroGLYCERIN (NITROSTAT) 0.4 MG SL tablet Place 0.4 mg under the tongue every 5 (five) minutes as needed for chest pain. If no relief by 3rd tab, call 911  08/20/2017: Never used  . omeprazole (PRILOSEC) 40 MG capsule TAKE ONE CAPSULE BY MOUTH DAILY BEFORE SUPPER   . oxymorphone (OPANA) 5 MG tablet Take 5 mg by mouth 3 (three) times daily as needed for pain (max 3 tablets daily). Per Dr Brien Few 06/16/2018: Patient only taking TWICE DAILY  . PARoxetine (PAXIL) 40 MG tablet Take 1 tablet (40 mg total) by mouth every morning.   . rosuvastatin (CRESTOR) 5 MG tablet TAKE 1 TABLET BY MOUTH EVERY DAY   . Semaglutide,0.25 or 0.5MG/DOS, (OZEMPIC, 0.25 OR 0.5 MG/DOSE,) 2 MG/1.5ML SOPN Inject 0.5 mg into the skin once a week.    No facility-administered encounter medications on file as of 07/09/2019.   Goals Addressed            This Visit's Progress   . Advance Directives   On track    Current Barriers:  Marland Kitchen Knowledge Deficits and Care Coordination needs related to Advanced Directives for patient with HTN and DMII   .  Reviewed documents and has questions Clinical Social Work Clinical Goal(s):  Marland Kitchen Over the next 45 days, the patient will  work with care management team on completion of Advance Directive   And provide notarized copy for provider's office . Interventions provided by LCSW: . Assessed barriers and discussed concerns with completing advance directive . Will review documents with patient during next provider office visit . Patient Self Care Activities:  . Is able to complete documentation independently . Able to identify next of kin or power or attorney Please see past updates related to this goal by clicking on the  "Past Updates" button in the selected goal       Plan: LCSW will meet with patient before or after office visit with PCP on 07/13/19 to review documents and answer questions.  Casimer Lanius, LCSW Clinical Social Worker [place] / [place]   [phone] 12:32 PM

## 2019-07-09 NOTE — Patient Instructions (Signed)
Licensed Clinical Social Worker Visit Information Anthony [name]  it was nice speaking with you. Please call me directly if you have questions [phone] Goals we discussed today:  Goals Addressed            This Visit's Progress   . Advance Directives   On track    Current Barriers:  Marland Kitchen Knowledge Deficits and Care Coordination needs related to Advanced Directives for patient with HTN and DMII   . Reviewed documents and has questions Clinical Social Work Clinical Goal(s):  Marland Kitchen Over the next 45 days, the patient will  work with care management team on completion of Advance Directive   And provide notarized copy for provider's office . Interventions provided by LCSW: . Assessed barriers and discussed concerns with completing advance directive . Will review documents with patient during next provider office visit . Patient Self Care Activities:  . Is able to complete documentation independently . Able to identify next of kin or power or attorney Please see past updates related to this goal by clicking on the "Past Updates" button in the selected goal      Materials provided:  Anthony [name] was given information about Care Management services today including:  1. Care Management services include personalized support from designated clinical staff supervised by his physician, including individualized plan of care and coordination with other care providers 2. 24/7 contact [phone] for assistance for urgent and routine care needs. 3. Care Management services at any time by phone call to the office staff. The patient verbalized understanding of instructions provided today and declined a print copy of patient instruction materials.  Follow up plan:  Next PCP appointment scheduled for: 06/22/19  LCSW will meet with patient before or after this visit  Maurine Cane, LCSW

## 2019-07-13 ENCOUNTER — Ambulatory Visit (INDEPENDENT_AMBULATORY_CARE_PROVIDER_SITE_OTHER): Payer: HMO | Admitting: Family Medicine

## 2019-07-13 ENCOUNTER — Other Ambulatory Visit: Payer: Self-pay

## 2019-07-13 ENCOUNTER — Ambulatory Visit: Payer: Self-pay | Admitting: Pharmacist

## 2019-07-13 ENCOUNTER — Encounter: Payer: Self-pay | Admitting: Family Medicine

## 2019-07-13 DIAGNOSIS — M79644 Pain in right finger(s): Secondary | ICD-10-CM | POA: Diagnosis not present

## 2019-07-13 DIAGNOSIS — Z794 Long term (current) use of insulin: Secondary | ICD-10-CM

## 2019-07-13 DIAGNOSIS — M65341 Trigger finger, right ring finger: Secondary | ICD-10-CM

## 2019-07-13 DIAGNOSIS — E119 Type 2 diabetes mellitus without complications: Secondary | ICD-10-CM

## 2019-07-13 MED ORDER — METHYLPREDNISOLONE ACETATE 40 MG/ML IJ SUSP
40.0000 mg | Freq: Once | INTRAMUSCULAR | Status: AC
  Administered 2019-07-13: 40 mg via INTRAMUSCULAR

## 2019-07-13 NOTE — Assessment & Plan Note (Signed)
Patient tolerated 1-1 lidocaine 1% without epi and Depo-Medrol injection well.  Patient provided return precautions.  If no improvement in 2 to 4 weeks, patient to return for second injection.  If no improvement at that time, will refer to surgery.

## 2019-07-13 NOTE — Progress Notes (Signed)
   CHIEF COMPLAINT / HPI:  Trigger finger follow-up Patient was seen in December and diagnosed with trigger finger.  He did not want any injections at that time.  Patient returns today with worsening and more bothersome pain in his right hand associated with trigger finger.  Patient denies any paresthesias.  PERTINENT  PMH / PSH: Diabetes   OBJECTIVE: There were no vitals taken for this visit.  Patient appears well, no acute distress Right hand: Nodule palpated on palmar aspect of right hand just proximal to MCP.  Finger appears to catch with finger flexion and requires manual extension.   Trigger Finger Injection Note PRE-OP DIAGNOSIS: Trigger Finger, 4th finger, laterality: right hand  POST-OP DIAGNOSIS: Same  PROCEDURE: joint injection Performing Physician: Dr. [name] Supervising Physician (if applicable): Dr. Nori Riis   1:1 Lidocaine 1 % w/o epi + 1 mL Depomedrol    Procedure: The area was prepped in the usual sterile manner. The needle was inserted into the affected area and the steroid was injected.  There were no complications during this procedure. 1 mL of solution was discarded.    Followup: The patient tolerated the procedure well without complications.  Standard post-procedure care is explained and return precautions are given.  ASSESSMENT / PLAN:  Trigger finger, right ring finger Patient tolerated 1-1 lidocaine 1% without epi and Depo-Medrol injection well.  Patient provided return precautions.  If no improvement in 2 to 4 weeks, patient to return for second injection.  If no improvement at that time, will refer to surgery.    [name], MD [place]

## 2019-07-15 ENCOUNTER — Other Ambulatory Visit: Payer: Self-pay | Admitting: Cardiology

## 2019-07-16 NOTE — Progress Notes (Signed)
Chronic Care Management   Visit Note  07/13/2019 Name: Anthony [name] MRN: [id number] DOB: 08-Mar-1951  Referred by: [name], MD Reason for referral : Chronic Care Management   Anthony [name] is a 69 y.o. year old male who is a primary care patient of Anthony [name], Anthony Quale, MD. The CCM team was consulted for assistance with chronic disease management and care coordination needs related to DMII  Review of patient status, including review of consultants reports, relevant laboratory and other test results, and collaboration with appropriate care team members and the patient's provider was performed as part of comprehensive patient evaluation and provision of chronic care management services.    SDOH (Social Determinants of Health) assessments performed: Yes    Medications: Outpatient Encounter Medications as of 07/13/2019  Medication Sig Note  . Blood Glucose Monitoring Suppl (ONETOUCH VERIO) w/Device KIT Use kit to check blood sugars.   . calcitRIOL (ROCALTROL) 0.5 MCG capsule Take 0.5 mcg by mouth daily at 12 noon.    Marland Kitchen ELIQUIS 5 MG TABS tablet TAKE 1 TABLET BY MOUTH TWICE A DAY   . furosemide (LASIX) 40 MG tablet Take 1 tablet (40 mg total) by mouth daily. (Patient taking differently: Take 80 mg by mouth daily. ) 06/11/2019: Reports  takes 80 mg daily  . gabapentin (NEURONTIN) 300 MG capsule TAKE ONE CAPSULE BY MOUTH EVERY NIGHT AT BEDTIME   . glucose blood (ONETOUCH VERIO) test strip Use as instructed   . hydrALAZINE (APRESOLINE) 25 MG tablet Take 1 tablet (25 mg total) by mouth 3 (three) times daily.   . Insulin Degludec (TRESIBA FLEXTOUCH) 200 UNIT/ML SOPN Inject 42 Units into the skin daily.   . Insulin Pen Needle (B-D ULTRAFINE III SHORT PEN) 31G X 8 MM MISC 1 UNITS BY DOES NOT APPLY ROUTE AS NEEDED.   Marland Kitchen KLOR-CON M20 20 MEQ tablet TAKE 1 TABLET BY MOUTH TWICE A DAY   . labetalol (NORMODYNE) 200 MG tablet TAKE 2 TABLETS BY MOUTH TIMES DAILY.   Marland Kitchen Lancets (ONETOUCH ULTRASOFT) lancets Use  as instructed   . Multiple Vitamin (MULTIVITAMIN) tablet Take 1 tablet by mouth daily.     . nitroGLYCERIN (NITROSTAT) 0.4 MG SL tablet Place 0.4 mg under the tongue every 5 (five) minutes as needed for chest pain. If no relief by 3rd tab, call 911  08/20/2017: Never used  . omeprazole (PRILOSEC) 40 MG capsule TAKE ONE CAPSULE BY MOUTH DAILY BEFORE SUPPER   . oxymorphone (OPANA) 5 MG tablet Take 5 mg by mouth 3 (three) times daily as needed for pain (max 3 tablets daily). Per Dr Brien Few 06/16/2018: Patient only taking TWICE DAILY  . PARoxetine (PAXIL) 40 MG tablet Take 1 tablet (40 mg total) by mouth every morning.   . rosuvastatin (CRESTOR) 5 MG tablet TAKE 1 TABLET BY MOUTH EVERY DAY   . Semaglutide,0.25 or 0.5MG/DOS, (OZEMPIC, 0.25 OR 0.5 MG/DOSE,) 2 MG/1.5ML SOPN Inject 0.5 mg into the skin once a week.    No facility-administered encounter medications on file as of 07/13/2019.     Objective:   Goals Addressed            This Visit's Progress     Patient Stated   . I would like to optimize my diabetes management (pt-stated)       Current Barriers:  . Diabetes: U7ML; complicated by chronic medical conditions including HTN, HLD, most recent A1c 7.8% . Current antihyperglycemic regimen: Ozempic 0.34m weekly, Tresiba 42 units - Will continue  to optimize GLP1 - Ozempic sample given-1 box, LOT [id number], EXP 05/2021 - TRESIBA sample given-1 pen/box, LOT [id number]; exp 03/2021 - Novo requiring LIS/extra help application-complete with patient via telephone on 06/14/19 . SS office called patient, but we had to leave VM with DSS x2 . Must transition GLP1 to Trulicity-->Application to be submitted for Trulicity via [facility] due to novo nordisk issues (unable to obtain Ozempic)--need patient's newest SS but he only has 2019. Stay tuned . 07/13/19-Patient picked up [place] from local pharmacy--> $0 copay . Denies Hypogylcemia now that he is on basal insulin; Denies hyperglycemic symptoms . Current meal  patterns: o Breakfast: eggs,bacon, toast-coffee (no sugar) o Lunch: sometimes skips, will have a pack of crackers around 3pm o Supper: at 5-6pm chicken/fish, veggie, baked potato (encouraged pt to limit salt/half of potato) o Snacks: not a big snacker o Drinks: avoids sugary drinks, mostly coffee, tea or water - Discovered patient was drinking gingerale x2 daily at times.  Reviewed sugar content 36gm in 12oz.  Encouraged pt to try diet or bubbly flavored water alternative . Current exercise: attempts to walk 10-2mn per day, limited by back pain, muscle weakness improving . Current blood glucose readings: checks Bgs around 10am-1pm: per glucometer--patient has not checked Bgs consistently.  Readings in the AM 130-180 (will increase insulin) . Cardiovascular risk reduction: o Current hypertensive regimen: hydralazine, labetalol (ACEi induced cough) - Encouraged patient to check BP 3x weekly - He brought in [employer] BP machine  - BP at recent Kidney MD visit was 130/88.  Goal 130/80. Readings 150-160/80-90 on current BP machine - Encouraged low salt diet; discussed foods o Current hyperlipidemia regimen: rosuvastatin 554mqHS (tolerating well) last LDL 100 in 11/2018 o Current antiplatelet regimen: apixaban  o Neuropathy: Patient restarted gabapentin 30072mHS on 05/20/19.  He states he experienced muscle weakness when he stopped taking this medication.  This condition is improving with re-initiation of gabapentin.  Continue current management   Pharmacist Clinical Goal(s):  . OMarland Kitchener the next 90 days, patient will work with PharmD and primary care provider to address related to optimization of medication management of diabetes  Interventions: . Comprehensive medication review performed, medication list updated in electronic medical record . Reviewed & discussed the following diabetes-related information with patient: o Reviewed ADA recommended "diabetes-friendly" diet  (reviewed healthy snack/food  options) o Discussed insulin/GLP-1 injection technique; Patient uses One touch Verio glucometer o Reviewed medication purpose/side effects-->patient denies adverse events  Patient Self Care Activities:  . Patient will check blood glucose daily, document, and provide at future appointments . Patient will focus on medication adherence . Patient will take medications as prescribed . Patient will contact provider with any episodes of hypoglycemia . Patient will report any questions or concerns to provider   Please see past updates related to this goal by clicking on the "Past Updates" button in the selected goal            Plan:   The care management team will reach out to the patient again over the next 30 days.   Provider Signature\ JulRegina EckharmD, BCPS Clinical Pharmacist, [phone]

## 2019-07-20 DIAGNOSIS — E1165 Type 2 diabetes mellitus with hyperglycemia: Secondary | ICD-10-CM | POA: Diagnosis not present

## 2019-08-03 ENCOUNTER — Ambulatory Visit: Payer: BC Managed Care – PPO | Admitting: Podiatry

## 2019-08-05 ENCOUNTER — Ambulatory Visit: Payer: Self-pay | Admitting: Pharmacist

## 2019-08-05 ENCOUNTER — Other Ambulatory Visit: Payer: Self-pay | Admitting: Cardiology

## 2019-08-05 DIAGNOSIS — Z794 Long term (current) use of insulin: Secondary | ICD-10-CM

## 2019-08-05 DIAGNOSIS — G4733 Obstructive sleep apnea (adult) (pediatric): Secondary | ICD-10-CM | POA: Diagnosis not present

## 2019-08-05 DIAGNOSIS — E119 Type 2 diabetes mellitus without complications: Secondary | ICD-10-CM

## 2019-08-06 NOTE — Progress Notes (Signed)
  Chronic Care Management   Outreach Note  08/05/2019 Name: Anthony [name] MRN: [id number] DOB: 1951-03-30  Referred by: [name], MD Reason for referral : Chronic Care Management and Diabetes    Follow Up Plan: Face to Face appointment with care management team member scheduled for: 08/10/19 at [facility] Hessie Varone, PharmD, BCPS Clinical Pharmacist, [place]: [phone]

## 2019-08-10 ENCOUNTER — Other Ambulatory Visit: Payer: Self-pay

## 2019-08-10 ENCOUNTER — Ambulatory Visit: Payer: Self-pay | Admitting: Pharmacist

## 2019-08-10 DIAGNOSIS — E119 Type 2 diabetes mellitus without complications: Secondary | ICD-10-CM

## 2019-08-11 ENCOUNTER — Other Ambulatory Visit: Payer: Self-pay | Admitting: Pharmacy Technician

## 2019-08-11 ENCOUNTER — Ambulatory Visit (INDEPENDENT_AMBULATORY_CARE_PROVIDER_SITE_OTHER): Payer: HMO | Admitting: *Deleted

## 2019-08-11 DIAGNOSIS — I5022 Chronic systolic (congestive) heart failure: Secondary | ICD-10-CM

## 2019-08-11 LAB — CUP PACEART REMOTE DEVICE CHECK
Battery Remaining Longevity: 23 mo
Battery Voltage: 2.94 V
Brady Statistic AP VP Percent: 0 %
Brady Statistic AP VS Percent: 0 %
Brady Statistic AS VP Percent: 0 %
Brady Statistic AS VS Percent: 0 %
Brady Statistic RA Percent Paced: 0 %
Brady Statistic RV Percent Paced: 99.54 %
Date Time Interrogation Session: 20210317012405
HighPow Impedance: 73 Ohm
Implantable Lead Implant Date: 20190301
Implantable Lead Implant Date: 20190301
Implantable Lead Location: 753858
Implantable Lead Location: 753860
Implantable Pulse Generator Implant Date: 20190301
Lead Channel Impedance Value: 1235 Ohm
Lead Channel Impedance Value: 1235 Ohm
Lead Channel Impedance Value: 1235 Ohm
Lead Channel Impedance Value: 1254 Ohm
Lead Channel Impedance Value: 1254 Ohm
Lead Channel Impedance Value: 1292 Ohm
Lead Channel Impedance Value: 341.736
Lead Channel Impedance Value: 341.736
Lead Channel Impedance Value: 351.5 Ohm
Lead Channel Impedance Value: 358.75 Ohm
Lead Channel Impedance Value: 369.526
Lead Channel Impedance Value: 399 Ohm
Lead Channel Impedance Value: 4047 Ohm
Lead Channel Impedance Value: 475 Ohm
Lead Channel Impedance Value: 665 Ohm
Lead Channel Impedance Value: 703 Ohm
Lead Channel Impedance Value: 703 Ohm
Lead Channel Impedance Value: 779 Ohm
Lead Channel Pacing Threshold Amplitude: 0.5 V
Lead Channel Pacing Threshold Amplitude: 3.25 V
Lead Channel Pacing Threshold Pulse Width: 0.4 ms
Lead Channel Pacing Threshold Pulse Width: 1.5 ms
Lead Channel Sensing Intrinsic Amplitude: 4.875 mV
Lead Channel Sensing Intrinsic Amplitude: 4.875 mV
Lead Channel Setting Pacing Amplitude: 2.5 V
Lead Channel Setting Pacing Amplitude: 3.25 V
Lead Channel Setting Pacing Pulse Width: 0.4 ms
Lead Channel Setting Pacing Pulse Width: 1.5 ms
Lead Channel Setting Sensing Sensitivity: 0.3 mV

## 2019-08-11 NOTE — Patient Outreach (Signed)
[name]) Care Management  08/11/2019  Anthony [name] [birth year] [id number]   Received patient portion(s) of patient assistance application(s) for Trulicity. Faxed completed application and required documents into [employer].  Will follow up with company(ies) in 10-14 business days to check status of application(s).  [name] [place] Certified Pharmacy Technician [place] Management Direct [phone]

## 2019-08-12 ENCOUNTER — Ambulatory Visit (INDEPENDENT_AMBULATORY_CARE_PROVIDER_SITE_OTHER): Payer: HMO | Admitting: *Deleted

## 2019-08-12 ENCOUNTER — Telehealth: Payer: Self-pay | Admitting: Student

## 2019-08-12 ENCOUNTER — Other Ambulatory Visit: Payer: Self-pay

## 2019-08-12 ENCOUNTER — Telehealth: Payer: Self-pay | Admitting: Emergency Medicine

## 2019-08-12 DIAGNOSIS — I4821 Permanent atrial fibrillation: Secondary | ICD-10-CM | POA: Diagnosis not present

## 2019-08-12 DIAGNOSIS — T829XXA Unspecified complication of cardiac and vascular prosthetic device, implant and graft, initial encounter: Secondary | ICD-10-CM

## 2019-08-12 LAB — CUP PACEART INCLINIC DEVICE CHECK
Battery Remaining Longevity: 22 mo
Battery Voltage: 2.94 V
Brady Statistic AP VP Percent: 0 %
Brady Statistic AP VS Percent: 0 %
Brady Statistic AS VP Percent: 0 %
Brady Statistic AS VS Percent: 0 %
Brady Statistic RA Percent Paced: 0 %
Brady Statistic RV Percent Paced: 99.56 %
Date Time Interrogation Session: 20210318141400
HighPow Impedance: 73 Ohm
Implantable Lead Implant Date: 20190301
Implantable Lead Implant Date: 20190301
Implantable Lead Location: 753858
Implantable Lead Location: 753860
Implantable Pulse Generator Implant Date: 20190301
Lead Channel Impedance Value: 1026 Ohm
Lead Channel Impedance Value: 1064 Ohm
Lead Channel Impedance Value: 1083 Ohm
Lead Channel Impedance Value: 1121 Ohm
Lead Channel Impedance Value: 1140 Ohm
Lead Channel Impedance Value: 1140 Ohm
Lead Channel Impedance Value: 299.175
Lead Channel Impedance Value: 308.092
Lead Channel Impedance Value: 312.348
Lead Channel Impedance Value: 313.212
Lead Channel Impedance Value: 327.681
Lead Channel Impedance Value: 399 Ohm
Lead Channel Impedance Value: 4047 Ohm
Lead Channel Impedance Value: 513 Ohm
Lead Channel Impedance Value: 589 Ohm
Lead Channel Impedance Value: 608 Ohm
Lead Channel Impedance Value: 646 Ohm
Lead Channel Impedance Value: 665 Ohm
Lead Channel Pacing Threshold Amplitude: 0.5 V
Lead Channel Pacing Threshold Amplitude: 3.25 V
Lead Channel Pacing Threshold Pulse Width: 0.4 ms
Lead Channel Pacing Threshold Pulse Width: 1.5 ms
Lead Channel Sensing Intrinsic Amplitude: 4.875 mV
Lead Channel Sensing Intrinsic Amplitude: 4.875 mV
Lead Channel Setting Pacing Amplitude: 2.5 V
Lead Channel Setting Pacing Amplitude: 3.5 V
Lead Channel Setting Pacing Pulse Width: 0.4 ms
Lead Channel Setting Pacing Pulse Width: 1.5 ms
Lead Channel Setting Sensing Sensitivity: 0.3 mV

## 2019-08-12 NOTE — Telephone Encounter (Signed)
Discussed with Dr. Caryl Comes.  With slight increase in LV threshold, reasonable to check chest Xray.  Called and left message with patient to call back device clinic to discuss timing.    CXR ordered as future order, and from my understanding that means he can present to [facility] imaging at [facility] any time during business hours to have done.   Please let him know if he calls back.   Legrand Como [address]" [place], [place]  08/12/2019 3:45 PM

## 2019-08-12 NOTE — Telephone Encounter (Signed)
Patient to come in today for device clinic visit due to LV threshold of 3.25 V and outputs set at 3.25 V. Readings on LV was 2.75 V at last in clinic and per Dr Caryl Comes LV placed on monitor only and LV amplitude placed on 3.25 V. Patient needs refill on Crestor.

## 2019-08-12 NOTE — Telephone Encounter (Signed)
Patient was returning Anthony [name]'s Call. Patient would still like Anthony [name] to call him at his earliest convenience. Pt has other questions for [facility]

## 2019-08-12 NOTE — Progress Notes (Signed)
CRT-D device check in office. Sensing consistent with previous device measurements. Lead impedance trends stable over time. Threshold in LV was increased to 3.50 V. No mode switch episodes recorded. No ventricular arrhythmia episodes recorded. Patient bi-ventricularly pacing 99.6% of the time. Device programmed with appropriate safety margins. Heart failure diagnostics reviewed and trends are stable for patient. Estimated longevity 1.9 years.  Patient enrolled in remote follow up. Patient education completed.

## 2019-08-12 NOTE — Progress Notes (Signed)
ICD Remote  

## 2019-08-13 ENCOUNTER — Ambulatory Visit
Admission: RE | Admit: 2019-08-13 | Discharge: 2019-08-13 | Disposition: A | Discharge status: Home / Self Care | Payer: HMO | Source: Ambulatory Visit | Attending: Student | Admitting: Student

## 2019-08-13 DIAGNOSIS — T829XXA Unspecified complication of cardiac and vascular prosthetic device, implant and graft, initial encounter: Secondary | ICD-10-CM

## 2019-08-13 DIAGNOSIS — Z950 Presence of cardiac pacemaker: Secondary | ICD-10-CM | POA: Diagnosis not present

## 2019-08-13 NOTE — Telephone Encounter (Signed)
I let patient know he could proceed to [facility] imaging for CXR at his earliest convenience. Order is in.

## 2019-08-16 NOTE — Progress Notes (Signed)
Chronic Care Management    Visit Note  08/10/2019 Name: Anthony [name] MRN: [id number] DOB: [birth year]  Referred by: [name], MD Reason for referral : Chronic Care Management and Diabetes   Anthony [name] is a 69 y.o. year old male who is a primary care patient of Anthony [name], Anthony Quale, MD. The CCM team was consulted for assistance with chronic disease management and care coordination needs related to DMII  Review of patient status, including review of consultants reports, relevant laboratory and other test results, and collaboration with appropriate care team members and the patient's provider was performed as part of comprehensive patient evaluation and provision of chronic care management services.    SDOH (Social Determinants of Health) assessments performed: Yes See Care Plan activities for detailed interventions related to SDOH     Medications: Outpatient Encounter Medications as of 08/10/2019  Medication Sig Note  . Blood Glucose Monitoring Suppl (ONETOUCH VERIO) w/Device KIT Use kit to check blood sugars.   . calcitRIOL (ROCALTROL) 0.5 MCG capsule Take 0.5 mcg by mouth daily at 12 noon.    Marland Kitchen ELIQUIS 5 MG TABS tablet TAKE 1 TABLET BY MOUTH TWICE A DAY   . furosemide (LASIX) 40 MG tablet Take 1 tablet (40 mg total) by mouth daily. (Patient taking differently: Take 80 mg by mouth daily. ) 06/11/2019: Reports  takes 80 mg daily  . gabapentin (NEURONTIN) 300 MG capsule TAKE ONE CAPSULE BY MOUTH EVERY NIGHT AT BEDTIME   . glucose blood (ONETOUCH VERIO) test strip Use as instructed   . hydrALAZINE (APRESOLINE) 25 MG tablet Take 1 tablet (25 mg total) by mouth 3 (three) times daily.   . Insulin Degludec (TRESIBA FLEXTOUCH) 200 UNIT/ML SOPN Inject 42 Units into the skin daily.   . Insulin Pen Needle (B-D ULTRAFINE III SHORT PEN) 31G X 8 MM MISC 1 UNITS BY DOES NOT APPLY ROUTE AS NEEDED.   Marland Kitchen KLOR-CON M20 20 MEQ tablet TAKE 1 TABLET BY MOUTH TWICE A DAY   . labetalol (NORMODYNE) 200 MG  tablet TAKE 2 TABLETS BY MOUTH TIMES DAILY.   Marland Kitchen Lancets (ONETOUCH ULTRASOFT) lancets Use as instructed   . Multiple Vitamin (MULTIVITAMIN) tablet Take 1 tablet by mouth daily.     . nitroGLYCERIN (NITROSTAT) 0.4 MG SL tablet Place 0.4 mg under the tongue every 5 (five) minutes as needed for chest pain. If no relief by 3rd tab, call 911  08/20/2017: Never used  . omeprazole (PRILOSEC) 40 MG capsule TAKE ONE CAPSULE BY MOUTH DAILY BEFORE SUPPER   . oxymorphone (OPANA) 5 MG tablet Take 5 mg by mouth 3 (three) times daily as needed for pain (max 3 tablets daily). Per Dr Brien Few 06/16/2018: Patient only taking TWICE DAILY  . PARoxetine (PAXIL) 40 MG tablet Take 1 tablet (40 mg total) by mouth every morning.   . rosuvastatin (CRESTOR) 5 MG tablet TAKE 1 TABLET BY MOUTH EVERY DAY   . Semaglutide,0.25 or 0.5MG/DOS, (OZEMPIC, 0.25 OR 0.5 MG/DOSE,) 2 MG/1.5ML SOPN Inject 0.5 mg into the skin once a week.    No facility-administered encounter medications on file as of 08/10/2019.     Objective:   Goals Addressed            This Visit's Progress     Patient Stated   . I would like to optimize my diabetes management (pt-stated)       Current Barriers:  . Diabetes: G8ZM; complicated by chronic medical conditions including HTN, HLD, most recent A1c  7.8% . Current antihyperglycemic regimen: Ozempic 0.71m weekly, Tresiba 44 units - Will continue to optimize GLP1 - Ozempic sample given-1 box, LOT [id number] EXP 05/2021 - Patient able to now get [place]345-monthupply for $0 copay under HTA insurance at local pharmacy (picked up on 07/13/19) - Novo requiring LIS/extra help application-complete with patient via telephone . Patient denied for LIS--will submit to [employer] [facility] proof of denial . THN CPhT, AsEtter Sjogrenollowing for patient assistance . Set up patient's Freestyle Libre CGM--he is excited about this technology . Denies Hypogylcemia now that he is on basal insulin; Denies  hyperglycemic symptoms . Current meal patterns: o Breakfast: eggs,bacon, toast-coffee (no sugar) o Lunch: sometimes skips, will have a pack of crackers around 3pm o Supper: at 5-6pm chicken/fish, veggie, baked potato (encouraged pt to limit salt/half of potato) o Snacks: not a big snacker o Drinks: avoids sugary drinks, mostly coffee, tea or water - Discovered patient was drinking gingerale x2 daily at times.  Reviewed sugar content 36gm in 12oz.  Encouraged pt to try diet or bubbly flavored water alternative . Current exercise: attempts to walk 10-1558mper day, limited by back pain, muscle weakness improving . Current blood glucose readings: checks Bgs around 10am-1pm: per glucometer--patient has not checked Bgs consistently.  Readings in the AM 90-125 . Cardiovascular risk reduction: o Current hypertensive regimen: hydralazine, labetalol (ACEi induced cough) - Encouraged patient to check BP 3x weekly - He brought in Omron BP machine  - BP at recent Kidney MD visit was 130/88.  Goal 130/80. Readings 150-160/80-90 on current BP machine - Encouraged low salt diet; discussed foods o Current hyperlipidemia regimen: rosuvastatin 5mg8mS (tolerating well) last LDL 100 in 11/2018 o Current antiplatelet regimen: apixaban  o Neuropathy: Patient restarted gabapentin 300mg60m on 05/20/19.  He states he experienced muscle weakness when he stopped taking this medication.  This condition is improving with re-initiation of gabapentin.  Continue current management   Pharmacist Clinical Goal(s):  . OveMarland Kitchen the next 90 days, patient will work with PharmD and primary care provider to address related to optimization of medication management of diabetes  Interventions: . Comprehensive medication review performed, medication list updated in electronic medical record . Reviewed & discussed the following diabetes-related information with patient: o Reviewed ADA recommended "diabetes-friendly" diet  (reviewed  healthy snack/food options) o Discussed insulin/GLP-1 injection technique; Patient uses One touch Verio glucometer o Reviewed medication purpose/side effects-->patient denies adverse events  Patient Self Care Activities:  . Patient will check blood glucose daily, document, and provide at future appointments . Patient will focus on medication adherence . Patient will take medications as prescribed . Patient will contact provider with any episodes of hypoglycemia . Patient will report any questions or concerns to provider   Please see past updates related to this goal by clicking on the "Past Updates" button in the selected goal          Provider Signature   JulieRegina EckrmD, BCPS Josephinemacist, [facility].[phone]

## 2019-08-19 DIAGNOSIS — E1165 Type 2 diabetes mellitus with hyperglycemia: Secondary | ICD-10-CM | POA: Diagnosis not present

## 2019-08-20 ENCOUNTER — Other Ambulatory Visit: Payer: Self-pay

## 2019-08-20 MED ORDER — ROSUVASTATIN CALCIUM 5 MG PO TABS: 5.0000 mg | ORAL_TABLET | Freq: Every day | ORAL | 2 refills | Status: DC

## 2019-08-25 ENCOUNTER — Telehealth: Payer: Self-pay | Admitting: Pharmacist

## 2019-08-25 NOTE — Telephone Encounter (Signed)
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 Valentina Lucks) will be following him and managing his DM. He remembers Laurey Arrow as "the tall guy".   Patient reports BG readings below..  08/24/19 11:30 PM 300 08/25/19 4:11 AM 271 08/25/19 11:29 AM 191 08/25/19 1:30 PM 317 08/25/19 2:11 PM 201 08/25/19 4:57 PM 158  He says he feels less shaky taking [place]. He confirms he is not experiencing any episodes of hypoglycemia.  Patient has increased from [place] 48 units daily to [place] 50 units daily. He also administers 0.5 mg subQ once weekly (Thursdays). He states he has been slowly increasing his insulin based on Dr. Gayleen Orem previous recommendations.  Patient is monitoring blood glucose very frequently. Discussed with patient it is optimal to monitor fasting blood glucose daily and if he is interested he can monitor 2-hr PPBG readings after meals (usually dinner). Patient verbalized understanding.   Advised patient to monitor BG and if he notices his morning BG readings are > 150 mg/dL he can increase Tresiba by 2 units every 3 days. Thoroughly discussed hypoglycemia management (he prefers to use apple juice). Patient verbalized understanding. Continue Ozempic 0.5 mg subQ weekly (Thursdays).   He prefers to be called in the afternoons  Will contact patient in 1 week to follow up and re-assess BG management.  Thank you for involving pharmacy to assist in providing this patient's care.   Drexel Iha, PharmD PGY2 Ambulatory Care Pharmacy Resident

## 2019-08-27 ENCOUNTER — Telehealth: Payer: Self-pay | Admitting: *Deleted

## 2019-08-27 NOTE — Telephone Encounter (Signed)
Duplicate encounter, see phone note from 08/12/19.

## 2019-08-27 NOTE — Telephone Encounter (Signed)
-----   Message from Deboraha Sprang, MD sent at 08/25/2019  5:41 PM EDT ----- Remote reviewed. This remote is abnormal for high LV threshold

## 2019-09-02 NOTE — Telephone Encounter (Signed)
Patient calls nurse line returning [place] phone call. Will router to [employer].

## 2019-09-03 ENCOUNTER — Telehealth: Payer: Self-pay | Admitting: Pharmacist

## 2019-09-03 NOTE — Telephone Encounter (Signed)
Called patient on 09/03/2019 at 12:15 PM   Scheduled office visit appt on 09/16/19 at 1:30 PM to discuss DM management and counsel on Trulicity pen device (switched from [place] due to cost; will be receiving Trulicity from [place] you for involving pharmacy to assist in providing this patient's care.   Drexel Iha, PharmD PGY2 Ambulatory Care Pharmacy Resident

## 2019-09-09 ENCOUNTER — Telehealth: Payer: Self-pay | Admitting: Pharmacist

## 2019-09-09 NOTE — Telephone Encounter (Signed)
Call from patient who reports that his blood sugars are much improved.  He reports blood sugars 52, 61 and 40.  He denies feeling "shakey".  He does try to eat every 2 hours during the day.   He is currently taking Ozempic 0.5mg  once weekly (took dose this AM) and  Tresiba 50 units once daily.   After some discussion we agreed to decrease his Tresiba to 40 units once daily until his appointment with me in 1 week.   He requested samples of both Ozempic and Tyler Aas (which he will run out of prior to visit in 1 week).  Medication Samples have been provided to the patient.  Drug name: Tyler Aas (insulin degludec)       Strength: 200units/ml        Qty: 3 pens  LOT: [id number]  Exp.Date: 04/25/2021  Dosing instructions:  Decrease dose from 50 to 40 units once daily in the AM   Drug name: Ozempic (semaglutide)         Qty: 2 pens  LOT: [id number]  Exp.Date: 06/26/2021  Dosing instructions: 0.5mg  dose once weekly   The patient has been instructed regarding the correct time, dose, and frequency of taking this medication, including desired effects and most common side effects.   Collier Salina Carlissa Pesola 1:08 PM 09/09/2019

## 2019-09-09 NOTE — Telephone Encounter (Signed)
Noted and agree. 

## 2019-09-13 ENCOUNTER — Other Ambulatory Visit: Payer: Self-pay | Admitting: Family Medicine

## 2019-09-15 DIAGNOSIS — G4733 Obstructive sleep apnea (adult) (pediatric): Secondary | ICD-10-CM | POA: Diagnosis not present

## 2019-09-16 ENCOUNTER — Other Ambulatory Visit: Payer: Self-pay

## 2019-09-16 ENCOUNTER — Ambulatory Visit (INDEPENDENT_AMBULATORY_CARE_PROVIDER_SITE_OTHER): Payer: HMO | Admitting: Pharmacist

## 2019-09-16 ENCOUNTER — Ambulatory Visit: Payer: HMO | Admitting: Pharmacist

## 2019-09-16 VITALS — BP 144/82 | HR 72 | Ht 73.0 in | Wt 244.0 lb

## 2019-09-16 DIAGNOSIS — I10 Essential (primary) hypertension: Secondary | ICD-10-CM

## 2019-09-16 DIAGNOSIS — Z794 Long term (current) use of insulin: Secondary | ICD-10-CM | POA: Diagnosis not present

## 2019-09-16 DIAGNOSIS — N184 Chronic kidney disease, stage 4 (severe): Secondary | ICD-10-CM | POA: Diagnosis not present

## 2019-09-16 DIAGNOSIS — Z95810 Presence of automatic (implantable) cardiac defibrillator: Secondary | ICD-10-CM

## 2019-09-16 DIAGNOSIS — E119 Type 2 diabetes mellitus without complications: Secondary | ICD-10-CM | POA: Diagnosis not present

## 2019-09-16 DIAGNOSIS — N2581 Secondary hyperparathyroidism of renal origin: Secondary | ICD-10-CM | POA: Diagnosis not present

## 2019-09-16 DIAGNOSIS — E1122 Type 2 diabetes mellitus with diabetic chronic kidney disease: Secondary | ICD-10-CM | POA: Diagnosis not present

## 2019-09-16 DIAGNOSIS — I129 Hypertensive chronic kidney disease with stage 1 through stage 4 chronic kidney disease, or unspecified chronic kidney disease: Secondary | ICD-10-CM | POA: Diagnosis not present

## 2019-09-16 LAB — POCT GLYCOSYLATED HEMOGLOBIN (HGB A1C): HbA1c, POC (controlled diabetic range): 8.1 % — AB (ref 0.0–7.0)

## 2019-09-16 MED ORDER — PEN NEEDLES 32G X 4 MM MISC: 1.0000 | 11 refills | Status: DC

## 2019-09-16 MED ORDER — TRESIBA FLEXTOUCH 200 UNIT/ML ~~LOC~~ SOPN: 25.0000 [IU] | PEN_INJECTOR | Freq: Every day | SUBCUTANEOUS | Status: DC

## 2019-09-16 MED ORDER — NITROGLYCERIN 0.4 MG SL SUBL: 0.4000 mg | SUBLINGUAL_TABLET | SUBLINGUAL | 3 refills | Status: DC | PRN

## 2019-09-16 MED ORDER — OZEMPIC (1 MG/DOSE) 2 MG/1.5ML ~~LOC~~ SOPN: 1.0000 mg | PEN_INJECTOR | SUBCUTANEOUS | 3 refills | Status: DC

## 2019-09-16 NOTE — Progress Notes (Signed)
S:     Chief Complaint  Patient presents with  . Medication Management    diabetes    Patient arrives in good spirits, ambulating without assistance. Presents for diabetes evaluation, education, and management.  Patient reports starting use of [place] recently.  Patient was referred and last seen by Primary Care Provider on 07/13/2019  Patient reports Diabetes was diagnosed in 2008.   Insurance coverage/medication affordability: [employer]  Patient reports adherence with medications. Patient reports tolerating Ozempic better, no GI adverse effects. Patient denies any weight loss since starting Ozempic. Current diabetes medications include: Ozempic 0.5 mg once weekly, Tresiba 40 units daily Current hypertension medications include: Lasix 80 mg daily, hydralazine 50 mg TID, labetalol 200 mg BID Current hyperlipidemia medications include: rosuvastatin 5 mg daily  Patient reports hypoglycemic events if he doesn't eat at noon, he "feels drunk" around 2 pm.  Patient reported dietary habits: Snacks every 2-3 hours. Eats a big meal at 5 pm. Typically eat vegetables, potatoes, and some meat. Patient eats fried chicken frequently.  Patient-reported exercise habits: walks 15 minutes daily   Patient reports nocturia (nighttime urination) about 3x/night up to 5x/night. Patient denies neuropathy (nerve pain). Patient reports blurry vision periodically caused by medication, patient thinks it's hydralazine. Pt sees ophthalmologist annually, last visit in February 2021. Patient reports seeing podiatrist regularly.    O:  Physical Exam Constitutional:      Appearance: He is obese.  Neurological:     Mental Status: Mental status is at baseline.      Review of Systems  Eyes: Positive for blurred vision.  All other systems reviewed and are negative.    Lab Results  Component Value Date   HGBA1C 8.1 (A) 09/16/2019   Vitals:   09/16/19 1142  BP: (!) 144/82   Pulse: 72  SpO2: 98%    Lipid Panel     Component Value Date/Time   CHOL 165 12/23/2018 1452   TRIG 139 12/23/2018 1452   HDL 37 (L) 12/23/2018 1452   CHOLHDL 4.5 12/23/2018 1452   CHOLHDL 3.2 02/28/2016 0906   VLDL 19 02/28/2016 0906   LDLCALC 100 (H) 12/23/2018 1452   LDLDIRECT 58 11/06/2011 1434    Home fasting blood sugars between 150-200s.  Clinical Atherosclerotic Cardiovascular Disease (ASCVD): No  The 10-year ASCVD risk score Mikey Bussing DC Jr., et al., 2013) is: 38.4%   Values used to calculate the score:     Age: 69 years     Sex: Male     Is Non-Hispanic African American: Yes     Diabetic: Yes     Tobacco smoker: No     Systolic Blood Pressure: 294 mmHg     Is BP treated: Yes     HDL Cholesterol: 37 mg/dL     Total Cholesterol: 165 mg/dL    A/P: Diabetes longstanding currently uncontrolled. Patient is able to verbalize appropriate hypoglycemia management plan. Patient is adherent with medication. A1C today is 8.1. Control is suboptimal due to sedentary lifestyle and poor diet. -Decreased dose of basal insulin Tresiba (insulin degludec) to 25 units daily.  -Increased dose of GLP-1 Ozempic (semaglutide) to 1 mg weekly on Thursdays.  -Extensively discussed pathophysiology of diabetes, recommended lifestyle interventions, dietary effects on blood sugar control -Counseled on s/sx of and management of hypoglycemia -Next A1C anticipated July 2021.   Hypertension longstanding currently uncontrolled.  Blood pressure goal = 130/80 mmHg. Patient reports medication adherence. Patient has been on losartan in the past but  medication was stopped with worsening renal function. Blood pressure control is suboptimal due to age, diet, and CKD. -Continue labetalol 200 mg BID, hydralazine 50 mg TID, and furosemide 80 mg daily -F/u with nephrologist Dr. Lorrene Reid about starting losartan  Written patient instructions provided.  Total time in face to face counseling 20 minutes.    Patient  seen with Berenice Bouton, PharmD, PGY-1 Pharmacy Resident and Drexel Iha, PharmD, PGY-2 Pharmacy Resident.   NOTE:  Patient called after nephrlology visit and left message that losartan restart at 25mg  was accepted by Dr. Lorrene Reid.  She will monitor re-challenge labs.

## 2019-09-16 NOTE — Assessment & Plan Note (Signed)
Hypertension longstanding currently uncontrolled.  Blood pressure goal = 130/80 mmHg. Patient reports medication adherence. Patient has been on losartan in the past but medication was stopped with worsening renal function. Blood pressure control is suboptimal due to age, diet, and CKD. -Continue labetalol 200 mg BID, hydralazine 50 mg TID, and furosemide 80 mg daily -F/u with nephrologist Dr. Lorrene Reid about starting losartan   NOTE:  Patient called after nephrlology visit and left message that losartan restart at 25mg  was accepted by Dr. Lorrene Reid.  She will monitor re-challenge labs.

## 2019-09-16 NOTE — Assessment & Plan Note (Signed)
Diabetes longstanding currently uncontrolled. Patient is able to verbalize appropriate hypoglycemia management plan. Patient is adherent with medication. A1C today is 8.1. Control is suboptimal due to sedentary lifestyle and poor diet. -Decreased dose of basal insulin Tresiba (insulin degludec) to 25 units daily.  -Increased dose of GLP-1 Ozempic (semaglutide) to 1 mg weekly on Thursdays.  -Extensively discussed pathophysiology of diabetes, recommended lifestyle interventions, dietary effects on blood sugar control -Counseled on s/sx of and management of hypoglycemia -Next A1C anticipated July 2021.

## 2019-09-16 NOTE — Patient Instructions (Addendum)
It was good to see you today.  Continue checking your blood sugar with Freestyle Libre at least every 8 hours. When you get home, take an extra dose of Ozempic 0.5 mg. Then on next Thursday, use two of the 0.5 mg pens until you run out, then use the 1 mg pen once weekly. Starting tomorrow, reduce Tyler Aas to 25 units daily  Ask Dr. Lorrene Reid about losartan for your blood pressure.  Follow up with Dr. [name].

## 2019-09-17 ENCOUNTER — Telehealth: Payer: Self-pay | Admitting: Pharmacist

## 2019-09-17 DIAGNOSIS — E119 Type 2 diabetes mellitus without complications: Secondary | ICD-10-CM

## 2019-09-17 DIAGNOSIS — N184 Chronic kidney disease, stage 4 (severe): Secondary | ICD-10-CM

## 2019-09-17 MED ORDER — TRESIBA FLEXTOUCH 200 UNIT/ML ~~LOC~~ SOPN: 20.0000 [IU] | PEN_INJECTOR | Freq: Every day | SUBCUTANEOUS | Status: DC

## 2019-09-17 NOTE — Progress Notes (Signed)
Reviewed: I agree with Dr. Koval's documentation and management. 

## 2019-09-17 NOTE — Telephone Encounter (Signed)
Returned patient's call on 09/17/2019 at 1:46 PM   Patient states he is doing well since appt yesterday. He states at his appt with his nephrologist, Dr. Lorrene Reid, losartan 25 mg daily was initiated (added med to his medication list). His next appt with Dr. Lorrene Reid will be in ~4 months. Dr. Lorrene Reid requested patient receive follow up labs via [facility]. Scheduled appt on 09/23/19 at 11:15 and ordered BMET.  He states he checked his BG this AM and reading was 72. He reports he did not feel shaky/sweat/dizzy. Advised patient to decrease Tresiba from 25 to 20 units. Also, advised patient to contact [facility] if he experiences any BG < 80 mg/dL. Patient verbalized understanding. Continue Ozempic 1 mg subQ weekly (Thursdays). Will re-assess DM at follow up appt on 09/23/19.  Thank you for involving pharmacy to assist in providing this patient's care.   Drexel Iha, PharmD PGY2 Ambulatory Care Pharmacy Resident

## 2019-09-18 DIAGNOSIS — E1165 Type 2 diabetes mellitus with hyperglycemia: Secondary | ICD-10-CM | POA: Diagnosis not present

## 2019-09-21 ENCOUNTER — Other Ambulatory Visit: Payer: Self-pay | Admitting: Family Medicine

## 2019-09-23 ENCOUNTER — Encounter: Payer: Self-pay | Admitting: Pharmacist

## 2019-09-23 ENCOUNTER — Other Ambulatory Visit: Payer: Self-pay

## 2019-09-23 ENCOUNTER — Ambulatory Visit (INDEPENDENT_AMBULATORY_CARE_PROVIDER_SITE_OTHER): Payer: HMO | Admitting: Pharmacist

## 2019-09-23 DIAGNOSIS — E119 Type 2 diabetes mellitus without complications: Secondary | ICD-10-CM | POA: Diagnosis not present

## 2019-09-23 DIAGNOSIS — Z87891 Personal history of nicotine dependence: Secondary | ICD-10-CM | POA: Diagnosis not present

## 2019-09-23 DIAGNOSIS — F5232 Male orgasmic disorder: Secondary | ICD-10-CM

## 2019-09-23 DIAGNOSIS — Z794 Long term (current) use of insulin: Secondary | ICD-10-CM

## 2019-09-23 DIAGNOSIS — I10 Essential (primary) hypertension: Secondary | ICD-10-CM | POA: Diagnosis not present

## 2019-09-23 MED ORDER — TRESIBA FLEXTOUCH 200 UNIT/ML ~~LOC~~ SOPN: 22.0000 [IU] | PEN_INJECTOR | Freq: Every day | SUBCUTANEOUS | Status: DC

## 2019-09-23 NOTE — Progress Notes (Signed)
Subjective:  Anthony [name] is a 69 y.o. male with hypertension.  Recently initiated losartan 25mg  (previously had taken lisinopril but had cough).   Arrives in good spirits however states he feels " a little sluggish today".    Hypertension ROS: no medication side effects noted, no TIA's, no chest pain on exertion, no dyspnea on exertion and no swelling of ankles.  New concerns: None.   Slightly elevated blood glucose since reducing Tresiba from 25 units to 20 units on dailly.  Increased Tresiba to 22 units daily.   Objective:  Wt 240 lb 6.4 oz (109 kg)   BMI 31.72 kg/m   Appearance alert, well appearing, and in no distress. General exam BP noted to be well controlled today in office, S1, S2 normal, no gallop, no murmur, chest clear, no JVD, no HSM, no edema.  Lab review: orders written for new lab studies as appropriate; see orders.   Assessment:   Hypertension improved.   Plan:  Hypertension and Diabetes with CKD recently with BP above goal.  At recent nephrology visit, agreement for trial of losartan 25mg  by nephrologist.  Patient had BMET today in office.     Diabetes, much improved blood glucose control, tolerating Ozempic 1mg  once weekly.  Denies any hypoglycemia.  Review of CGM reveals readings of 100-200 with no low readings apparent.   - Increase Tresiba from 20 to 22 units once daily.   PSA requested by patient (last checked in 2013 per EMR review), discussed with attending preceptor - agreed with assessment in this 69 year-old patient.    Follow-up with Dr. [name] 5/18

## 2019-09-23 NOTE — Assessment & Plan Note (Signed)
Hypertension and Diabetes with CKD recently with BP above goal.  At recent nephrology visit, agreement for trial of losartan 25mg  by nephrologist.  Patient had BMET today in office.

## 2019-09-23 NOTE — Progress Notes (Signed)
Reviewed: I agree with Dr. Koval's documentation and management. 

## 2019-09-23 NOTE — Assessment & Plan Note (Signed)
Diabetes, much improved blood glucose control, tolerating Ozempic 1mg  once weekly.  Denies any hypoglycemia.  Review of CGM reveals readings of 100-200 with no low readings apparent.   - Increase Tresiba from 20 to 22 units once daily.

## 2019-09-23 NOTE — Patient Instructions (Addendum)
Great to see you again today.   Please Increase your Tyler Aas from 20 to 22 units daily.   Continue all other medications as previous.   Next follow-up with Dr. [name] 5/18

## 2019-09-23 NOTE — Assessment & Plan Note (Addendum)
Longstanding history of tobacco abuse, since ~ age 69.  Quit smoking 10/28/2018.  Congratulated and encouraged to continue with treatment plan.

## 2019-09-24 ENCOUNTER — Telehealth: Payer: Self-pay | Admitting: Pharmacist

## 2019-09-24 ENCOUNTER — Encounter: Payer: Self-pay | Admitting: Podiatry

## 2019-09-24 ENCOUNTER — Ambulatory Visit (INDEPENDENT_AMBULATORY_CARE_PROVIDER_SITE_OTHER): Payer: HMO | Admitting: Podiatry

## 2019-09-24 VITALS — Temp 97.2°F

## 2019-09-24 DIAGNOSIS — D689 Coagulation defect, unspecified: Secondary | ICD-10-CM | POA: Diagnosis not present

## 2019-09-24 DIAGNOSIS — E119 Type 2 diabetes mellitus without complications: Secondary | ICD-10-CM

## 2019-09-24 DIAGNOSIS — M79676 Pain in unspecified toe(s): Secondary | ICD-10-CM | POA: Diagnosis not present

## 2019-09-24 DIAGNOSIS — B351 Tinea unguium: Secondary | ICD-10-CM

## 2019-09-24 DIAGNOSIS — M79609 Pain in unspecified limb: Secondary | ICD-10-CM

## 2019-09-24 LAB — BASIC METABOLIC PANEL
BUN/Creatinine Ratio: 9 — ABNORMAL LOW (ref 10–24)
BUN: 27 mg/dL (ref 8–27)
CO2: 24 mmol/L (ref 20–29)
Calcium: 9.3 mg/dL (ref 8.6–10.2)
Chloride: 103 mmol/L (ref 96–106)
Creatinine, Ser: 2.87 mg/dL — ABNORMAL HIGH (ref 0.76–1.27)
GFR calc Af Amer: 25 mL/min/{1.73_m2} — ABNORMAL LOW (ref >59)
GFR calc non Af Amer: 22 mL/min/{1.73_m2} — ABNORMAL LOW (ref >59)
Glucose: 233 mg/dL — ABNORMAL HIGH (ref 65–99)
Potassium: 4.4 mmol/L (ref 3.5–5.2)
Sodium: 140 mmol/L (ref 134–144)

## 2019-09-24 LAB — PSA: Prostate Specific Ag, Serum: 1.4 ng/mL (ref 0.0–4.0)

## 2019-09-24 NOTE — Telephone Encounter (Signed)
Noted and agree. 

## 2019-09-24 NOTE — Telephone Encounter (Signed)
Called left message - lab work was unchanged from previous.  No concerning changes.  Plan to follow-up with PCP, Dr. [name] in May.

## 2019-09-24 NOTE — Progress Notes (Signed)
This patient returns to my office for at risk foot care.  This patient requires this care by a professional since this patient will be at risk due to having venous insufficiency, chronic kidney disease stage IV diabetes and coagulation defect.  Patient is taking eliquiss.  Patient has not been seen in over 11 months.This patient is unable to cut nails himself since the patient cannot reach his nails.These nails are painful walking and wearing shoes.  This patient presents for at risk foot care today.  General Appearance  Alert, conversant and in no acute stress.  Vascular  Dorsalis pedis and posterior tibial  pulses are palpable  bilaterally.  Capillary return is within normal limits  bilaterally. Temperature is within normal limits  bilaterally.  Neurologic  Senn-Weinstein monofilament wire test within normal limits  bilaterally. Muscle power within normal limits bilaterally.  Nails Thick disfigured discolored nails with subungual debris  from hallux to fifth toes bilaterally. No evidence of bacterial infection or drainage bilaterally.  Orthopedic  No limitations of motion  feet .  No crepitus or effusions noted.  No bony pathology or digital deformities noted.  Amputation second toe left foot.  Skin  normotropic skin with no porokeratosis noted bilaterally.  No signs of infections or ulcers noted.     Onychomycosis  Pain in right toes  Pain in left toes  Consent was obtained for treatment procedures.   Mechanical debridement of nails 1-5  Right and 1,3-5 left foot. performed with a nail nipper.  Filed with dremel without incident.    Return office visit   3 months                   Told patient to return for periodic foot care and evaluation due to potential at risk complications.   Gardiner Barefoot DPM

## 2019-09-28 ENCOUNTER — Other Ambulatory Visit: Payer: Self-pay | Admitting: Pharmacy Technician

## 2019-09-28 NOTE — Patient Outreach (Signed)
[facility]) Care Management  09/28/2019  Anthony [name] 10-03-1950 [id number]    Follow up call placed to [facility] regarding patient assistance application(s) for Trulicity , [name] confirms patient has been approved as of 3/19 until 05/26/2020. Medication delivered to patients home on 4/1.  Follow up:  Informed RPh [facility]. Chana Bode, [place] Certified Pharmacy Technician [employer]

## 2019-09-29 ENCOUNTER — Other Ambulatory Visit: Payer: Self-pay | Admitting: Family Medicine

## 2019-10-11 ENCOUNTER — Telehealth: Payer: Self-pay | Admitting: Pharmacist

## 2019-10-11 ENCOUNTER — Other Ambulatory Visit: Payer: Self-pay

## 2019-10-11 NOTE — Telephone Encounter (Signed)
Call from patient stating he called his pharmacy and was told his Ozempic would bel $247  He stated he was confused b/c he thought we did paperwork to get this for him for free.   I reviewed the chart notes and found on 4/9 it appears we did paperwork with Orson Ape to obtain Trulicity (dulaglutide) in place of Ozempic.  Appears like the process was started but patient did not yet receive medication.   I shared with patient that I will have someone follow-up to evaluate status for Trulicity.  I shared that they may reach out to him in the upcoming days.   *note - he has not yet run out of Ozempic.

## 2019-10-11 NOTE — Telephone Encounter (Signed)
I called [employer] but they were unable to provide me with any information due to the fact I could not confirm the pt's address and provider info.  The pt's address is different than what is in [place], I called the pt and verified with him that his physical shipping address is [place], [place].  Would you know which MD signed the application by chance?  [employer] said it was a male and I went down the list that I have and was told the names didn't match.

## 2019-10-12 ENCOUNTER — Other Ambulatory Visit: Payer: Self-pay

## 2019-10-12 ENCOUNTER — Other Ambulatory Visit: Payer: Self-pay | Admitting: Internal Medicine

## 2019-10-12 ENCOUNTER — Encounter: Payer: Self-pay | Admitting: Pharmacist

## 2019-10-12 ENCOUNTER — Ambulatory Visit (INDEPENDENT_AMBULATORY_CARE_PROVIDER_SITE_OTHER): Payer: HMO | Admitting: Family Medicine

## 2019-10-12 ENCOUNTER — Encounter: Payer: Self-pay | Admitting: Family Medicine

## 2019-10-12 VITALS — BP 122/72 | HR 87 | Wt 240.2 lb

## 2019-10-12 DIAGNOSIS — I10 Essential (primary) hypertension: Secondary | ICD-10-CM | POA: Diagnosis not present

## 2019-10-12 DIAGNOSIS — Z596 Low income: Secondary | ICD-10-CM | POA: Diagnosis not present

## 2019-10-12 DIAGNOSIS — Z794 Long term (current) use of insulin: Secondary | ICD-10-CM | POA: Diagnosis not present

## 2019-10-12 DIAGNOSIS — E119 Type 2 diabetes mellitus without complications: Secondary | ICD-10-CM | POA: Diagnosis not present

## 2019-10-12 DIAGNOSIS — N184 Chronic kidney disease, stage 4 (severe): Secondary | ICD-10-CM

## 2019-10-12 MED ORDER — LOSARTAN POTASSIUM 25 MG PO TABS: 25.0000 mg | ORAL_TABLET | Freq: Every day | ORAL | 3 refills | Status: DC

## 2019-10-12 MED ORDER — GABAPENTIN 300 MG PO CAPS: 300.0000 mg | ORAL_CAPSULE | Freq: Every day | ORAL | 0 refills | Status: DC

## 2019-10-12 MED ORDER — POTASSIUM CHLORIDE CRYS ER 20 MEQ PO TBCR: 20.0000 meq | EXTENDED_RELEASE_TABLET | Freq: Two times a day (BID) | ORAL | 2 refills | Status: DC

## 2019-10-12 MED ORDER — ROSUVASTATIN CALCIUM 20 MG PO TABS: 20.0000 mg | ORAL_TABLET | Freq: Every day | ORAL | 3 refills | Status: DC

## 2019-10-12 NOTE — Telephone Encounter (Signed)
Prescription refill request for Eliquis received.  Last office visit: Caryl Comes, 03/19/2019 Scr: 2.87, 09/23/2019 Age: 69 y.o. Weight:109 kg  Prescription refill sent.

## 2019-10-12 NOTE — Progress Notes (Signed)
    SUBJECTIVE:  CHIEF COMPLAINT / HPI:   Hypertension and diabetes follow up:  Patient saw Dr. Landry Mellow ball on 09/23/2019 for diabetes and hypertension. It is important that he have good control on these for CKD. Patient reports that he was taking 22 units daily after previous appointment with Dr. Valentina Lucks.  Patient reports that he increased to 40 units since his sugars were in 300-400's daily starting last week.  Increased tresiba to 40 around last Wednesday when CBG staying 200-350  Patient takes tresiba 40 around 7-8 in the morning. Eats breakfast sometimes.  Eating lunch 12-2 pm. Usually has sandwich.  Eats dinner around 5-6 chicken fish veges, sometimes baked potato  9-10 has peanut butter and crackers, cheese and crackers   Reviewing patient's CGM, Pt has spikes at noon up to 350   Last 7 days  12-6a- 172 6-150 12-6.Marland Kitchen201 6-12--139   Last 90 days  12-6a--183 6-12p 158 12-6p 196 6-12a 171   Last 30 days  12-6 186 6-12 174 12-6 218 6-12 188   Last 14 days  12-6 210 190 240 192  Low events in last 14 days total 5: 4 from 6-12 p  6-12 1   Not interested in doing a meal time insulin with extra shot   Hypertension Patient was switched to losartan on 4/23 and 4/29 f/u labs wnl.   HLD Doing well. No issues. Will increase rosuvastatin increase to 20 mg   PERTINENT  PMH / PSH: IDDM 2, HLD, HTN, CKD   Medication List was reviewed and updated.   OBJECTIVE:  BP 122/72   Pulse 87   Wt 240 lb 3.2 oz (109 kg)   SpO2 97%   BMI 31.69 kg/m   General: NAD, non-toxic, well-appearing, sitting comfortably in chair   HEENT: [place]/AT. PERRLA. EOMI.  Cardiovascular: RRR, normal S1, S2. B/L 2+ RP. No BLEE Respiratory: CTAB. No IWOB.  Abdomen: + BS. NT, ND, soft to palpation.  Extremities: Warm and well perfused. Moving spontaneously.  Integumentary: No obvious rashes, lesions, trauma on general exam. Neuro: A & O x4. CN grossly intact. No FND  ASSESSMENT/PLAN:    HYPERTENSION, BENIGN SYSTEMIC Patient tolerating losartan well. Will continue at this time. Patient to continue monitoring BP at home. Follow up in 3 months   Chronic kidney disease (CKD), stage IV (severe) (HCC) Close monitoring of HTN & IDDM for prevention of decline of kidney disease   Insulin dependent type 2 diabetes mellitus (HCC) Trulicity 1.5 mg weekly replaced Ozempic. Rx sent out. Continuing 40 units. Patient spiking to 300s around noon. Reviewed diet with patient and we are unable to determine a pattern of food that is causing this spike. Asking patient to monitor his meals in a food diary. Follow up in 1 month.  Increasing rosuvastatin to 20 mg   Patient cannot afford medications Patient also pays about $100 out of pocket and has missed medicine in the past due to financial cost. Currently has all medicaitons and no issues at this time. Encouraged patient to reach out to pharamcies for lowest prices and we can coordinate Rx as needed.       [name], MD [place]

## 2019-10-12 NOTE — Progress Notes (Signed)
Noted and agree. 

## 2019-10-12 NOTE — Patient Instructions (Addendum)
Dear [name],   It was good to see you! Thank you for taking your time to come in to be seen. Today, we discussed the following:   Follow-up Continue with the tresiba as you are taking now. Please start a food diary to help [place] figure out why your sugars are getting so high in the afternoon.   Call your pharmacy and ask them about how much a 90 day supply costs for your current medications. If there are any cheaper alternatives, please let me know so that you are not paying more than you have to.  Check specifically with gabapentin and rosuvastatin   For all labs obtained today, I will send results via MyChart.  If anything is abnormal, we will call you for details on further management.   You are due for the following Health Maintenance items. Please schedule an appointment to address these prior to leaving.  Health Maintenance Due  Topic Date Due  . COVID-19 Vaccine (1) Never done  . OPHTHALMOLOGY EXAM  09/25/2018     Be well,   Zettie Cooley, M.D   [place] [phone]  *Sign up for MyChart for instant access to your health profile, labs, orders, upcoming appointments or to contact your provider with questions*  ===================================================================================

## 2019-10-13 LAB — LIPID PANEL
Chol/HDL Ratio: 3.5 ratio (ref 0.0–5.0)
Cholesterol, Total: 132 mg/dL (ref 100–199)
HDL: 38 mg/dL — ABNORMAL LOW (ref >39)
LDL Chol Calc (NIH): 77 mg/dL (ref 0–99)
Triglycerides: 89 mg/dL (ref 0–149)
VLDL Cholesterol Cal: 17 mg/dL (ref 5–40)

## 2019-10-18 ENCOUNTER — Encounter: Payer: Self-pay | Admitting: Family Medicine

## 2019-10-18 DIAGNOSIS — Z596 Low income: Secondary | ICD-10-CM | POA: Insufficient documentation

## 2019-10-18 DIAGNOSIS — E1165 Type 2 diabetes mellitus with hyperglycemia: Secondary | ICD-10-CM | POA: Diagnosis not present

## 2019-10-18 NOTE — Assessment & Plan Note (Addendum)
Trulicity 1.5 mg weekly replaced Ozempic. Rx sent out. Continuing 40 units. Patient spiking to 300s around noon. Reviewed diet with patient and we are unable to determine a pattern of food that is causing this spike. Asking patient to monitor his meals in a food diary. Follow up in 1 month.  Increasing rosuvastatin to 20 mg

## 2019-10-18 NOTE — Assessment & Plan Note (Signed)
Close monitoring of HTN & IDDM for prevention of decline of kidney disease

## 2019-10-18 NOTE — Assessment & Plan Note (Addendum)
Patient tolerating losartan well. Will continue at this time. Patient to continue monitoring BP at home. Follow up in 3 months

## 2019-10-18 NOTE — Assessment & Plan Note (Signed)
Patient also pays about $100 out of pocket and has missed medicine in the past due to financial cost. Currently has all medicaitons and no issues at this time. Encouraged patient to reach out to pharamcies for lowest prices and we can coordinate Rx as needed.

## 2019-11-02 ENCOUNTER — Other Ambulatory Visit: Payer: Self-pay | Admitting: Family Medicine

## 2019-11-10 ENCOUNTER — Other Ambulatory Visit: Payer: Self-pay | Admitting: Family Medicine

## 2019-11-10 ENCOUNTER — Ambulatory Visit (INDEPENDENT_AMBULATORY_CARE_PROVIDER_SITE_OTHER): Payer: HMO | Admitting: *Deleted

## 2019-11-10 DIAGNOSIS — I4821 Permanent atrial fibrillation: Secondary | ICD-10-CM | POA: Diagnosis not present

## 2019-11-10 LAB — CUP PACEART REMOTE DEVICE CHECK
Battery Remaining Longevity: 20 mo
Battery Voltage: 2.93 V
Brady Statistic AP VP Percent: 0 %
Brady Statistic AP VS Percent: 0 %
Brady Statistic AS VP Percent: 0 %
Brady Statistic AS VS Percent: 0 %
Brady Statistic RA Percent Paced: 0 %
Brady Statistic RV Percent Paced: 99.09 %
Date Time Interrogation Session: 20210616033427
HighPow Impedance: 68 Ohm
Implantable Lead Implant Date: 20190301
Implantable Lead Implant Date: 20190301
Implantable Lead Location: 753858
Implantable Lead Location: 753860
Implantable Pulse Generator Implant Date: 20190301
Lead Channel Impedance Value: 1026 Ohm
Lead Channel Impedance Value: 1026 Ohm
Lead Channel Impedance Value: 1026 Ohm
Lead Channel Impedance Value: 1026 Ohm
Lead Channel Impedance Value: 1026 Ohm
Lead Channel Impedance Value: 1064 Ohm
Lead Channel Impedance Value: 275.5 Ohm
Lead Channel Impedance Value: 289.049
Lead Channel Impedance Value: 289.049
Lead Channel Impedance Value: 297.365
Lead Channel Impedance Value: 297.365
Lead Channel Impedance Value: 399 Ohm
Lead Channel Impedance Value: 4047 Ohm
Lead Channel Impedance Value: 475 Ohm
Lead Channel Impedance Value: 551 Ohm
Lead Channel Impedance Value: 551 Ohm
Lead Channel Impedance Value: 608 Ohm
Lead Channel Impedance Value: 646 Ohm
Lead Channel Pacing Threshold Amplitude: 0.5 V
Lead Channel Pacing Threshold Amplitude: 3 V
Lead Channel Pacing Threshold Pulse Width: 0.4 ms
Lead Channel Pacing Threshold Pulse Width: 1.5 ms
Lead Channel Sensing Intrinsic Amplitude: 15 mV
Lead Channel Sensing Intrinsic Amplitude: 15 mV
Lead Channel Setting Pacing Amplitude: 2.5 V
Lead Channel Setting Pacing Amplitude: 3.5 V
Lead Channel Setting Pacing Pulse Width: 0.4 ms
Lead Channel Setting Pacing Pulse Width: 1.5 ms
Lead Channel Setting Sensing Sensitivity: 0.3 mV

## 2019-11-11 NOTE — Progress Notes (Signed)
Remote ICD transmission.   

## 2019-11-15 ENCOUNTER — Telehealth: Payer: Self-pay | Admitting: Pharmacist

## 2019-11-15 NOTE — Telephone Encounter (Signed)
Phone call returned to patient who called and left two voice messages for me on 6/16 while I was out of office.   Patient reports that his cost for Ozempic 1mg  injection was > $200.  He states his blood sugar control is "doing great" on his current regiment of Ozempic 1mg  weekly.  He requested samples for both Ozempic and Tresiba  Medication Samples have been provided to the patient.  Drug name: Ozempic (semaglutide)        Qty: 2 pens  LOT: [id number]  Exp.Date: 03/26/2022  Dosing instructions: Take 2 x 0.5mg  injections once weekly   Drug name: Tyler Aas (insulin degludec)       Strength: 200units/ml        Qty: 3 pens  LOT: [id number]  Exp.Date:  12/24/2020  Dosing instructions: Take 42 units once daily  The patient has been instructed regarding the correct time, dose, and frequency of taking this medication, including desired effects and most common side effects.   Janeann Forehand 10:25 AM 11/15/2019  Called pharmacy and discussed why costs of medicines were elevated:  Patient is currently in the payment gap for his Medicare coverage.   Patient has attempted to apply for "[place]"  (LIS) in the past (using the [employer]SA 10-20 form) without success.  He is not interested in applying again.   We agreed that paying through the coverage gap is likely the only way to create lower payments in the future.  (I agreed to help with samples as requested and available).

## 2019-11-17 DIAGNOSIS — E1165 Type 2 diabetes mellitus with hyperglycemia: Secondary | ICD-10-CM | POA: Diagnosis not present

## 2019-11-17 NOTE — Telephone Encounter (Signed)
Noted and thank you

## 2019-11-19 ENCOUNTER — Ambulatory Visit (INDEPENDENT_AMBULATORY_CARE_PROVIDER_SITE_OTHER): Payer: HMO | Admitting: Family Medicine

## 2019-11-19 ENCOUNTER — Other Ambulatory Visit: Payer: Self-pay

## 2019-11-19 DIAGNOSIS — S161XXA Strain of muscle, fascia and tendon at neck level, initial encounter: Secondary | ICD-10-CM | POA: Diagnosis not present

## 2019-11-19 DIAGNOSIS — N6082 Other benign mammary dysplasias of left breast: Secondary | ICD-10-CM

## 2019-11-19 NOTE — Progress Notes (Signed)
SUBJECTIVE:   CHIEF COMPLAINT / HPI:   Boil above nipple  Patient reports lump above left nipple that has been present for several years.  He reports that his been easily drained in the past but does continue to recur.  He notes that there is a foul smell from the area.  He denies any fevers, erythema, draining from the area.  Per chart review, Dr. Lindell Noe during this sebaceous cyst a couple of years ago and recommended that he follow-up with Derm for cyst wall removal which he never did.  The patient is requesting that we drain the lesion again.   Neck pain: Patient reports pain with coughing and stretching of his left anterior neck.  He does not know when it started.  He denies any recent trauma or accidents.  The pain is worse when he coughs.  Nothing makes the pain better.  It is tender to touch.  PERTINENT  PMH / PSH: Previous sebaceous cyst noted left chest wall  OBJECTIVE:   BP 138/74   Pulse 79   Ht 6\' 1"  (1.854 m)   Wt 236 lb 3.2 oz (107.1 kg)   SpO2 99%   BMI 31.16 kg/m   General: Patient well-appearing, no acute distress, no changes since last visit Chest wall: There is a large dilated circular pore about 2 mm in diameter that is soft to palpation. The circular cyst was palpated to about 2.5 cm in diameter.  With manipulation, there is a malodorous multicolored thick substance that is expressed easily pore. After material fully expressed, cyst wall inside of lesions is appreciated.  Neck: Full ROM. No pain with palpation to bony prominences. TTP of left SCM from mid neck down to origin site on left clavicle. No discoloration, lesions appreciated. No warmth to palpation or gross abnormalities.    Procedure Note PRE-OP DIAGNOSIS: Sebaceous cyst   POST-OP DIAGNOSIS: Same  PROCEDURE: Excision of Benign Lesion Performing Physician: Zettie Cooley, MD    PROCEDURE:  Prior to cyst wall removal, most material was expressed without anesthetics. Patient was asked to return to  clinic for cyst wall removal that same day due to time constraints. Upon return to clinic, procedure was continued.  The area was prepared in the usual, sterile manner. The site was anesthetized with 1% lidocaine without epinephrine. Thick, clay like material expressed easily. Vigorous expression was continued to express further contents and loosen cyst wall for extraction. The surgical site was thorougly explored for cyst contents. The site was cleaned with normal saline and dressed with sterile gauze. Site was not packed and left to heal by secondary intention. Bleeding was minimal.    Followup: The patient tolerated the procedure well without complications.  Standard post-procedure care is explained and return precautions are given.   ASSESSMENT/PLAN:   Sebaceous cyst of skin of breast, left Easily expressed thick clay like substance from 2 mm dilated pore on left chest wall. Cyst wall appreciated during expression and patient wanted cyst removed to prevent recurrence. Dr. Erin Hearing also saw patient to check for appropriateness of procedure. Cyst wall successfully removed. Patient given return precautions, and wound supplies. He tolerated procedure well.   Neck muscle strain Patient with TTP of left SCM. No other gross abnormalities appreciated. Likely due to malpositioning of head as patient denies any recent traumas. No concern to c-spine issues. Full ROM. Offered SCM neck stretches. If no improvement, patient should return to care.       [name], MD [facility]

## 2019-11-22 ENCOUNTER — Encounter: Payer: Self-pay | Admitting: Family Medicine

## 2019-11-22 DIAGNOSIS — S161XXA Strain of muscle, fascia and tendon at neck level, initial encounter: Secondary | ICD-10-CM | POA: Insufficient documentation

## 2019-11-22 NOTE — Assessment & Plan Note (Signed)
Patient with TTP of left SCM. No other gross abnormalities appreciated. Likely due to malpositioning of head as patient denies any recent traumas. No concern to c-spine issues. Full ROM. Offered SCM neck stretches. If no improvement, patient should return to care.

## 2019-11-22 NOTE — Assessment & Plan Note (Signed)
Easily expressed thick clay like substance from 2 mm dilated pore on left chest wall. Cyst wall appreciated during expression and patient wanted cyst removed to prevent recurrence. Dr. Erin Hearing also saw patient to check for appropriateness of procedure. Cyst wall successfully removed. Patient given return precautions, and wound supplies. He tolerated procedure well.

## 2019-11-24 ENCOUNTER — Other Ambulatory Visit: Payer: Self-pay

## 2019-11-24 NOTE — Patient Outreach (Signed)
[facility]) Care Management Chronic Special Needs Program  11/24/2019  Name: Anthony [name] DOB: Mar 17, 1951  MRN: [id number]  Anthony [name] is enrolled in a chronic special needs plan for Heart Failure. Reviewed and updated care plan.  Subjective: Client reports he is doing well. He states he has all his medications and denies any difficulty obtaining his medications at this time. Client is not sure if he has an appointment with cardiology. He reports he will call to make sure he is on the schedule. Client denies any issues or concerns at this time. No additional care management needs noted at this time.  Goals Addressed              This Visit's Progress   .  Client verbalize knowledge of Heart Failure disease self management skills within the next 6 months(continued 06/11/2019)   On track     Discussed follow up appointment schedule. Please call your heart doctor's (cardiologist) office to schedule visit for this year. Signs and symptoms of worsening heart failure reviewed. It is important to weigh daily and write down your weights. Pay attention to your body if you have any shortness of breath, swelling in your feet, ankles, legs or your waistband gets tight. Reinforced action plan to call cardiologist for weight gain over 3 pounds overnight or 5 pounds in a week; increased shortness of breath, increased fatigue, increased swelling or edema, chest discomfort.  Your provider may restrict or limit how much liquids you drink every day.       .  Client will verbalize knowledge of self management of Hypertension as evidences by BP reading of 140/90 or less; or as defined by provider   On track      Blood pressure 138/75 on 11/19/2019; 10/12/2019 122/72 and 4/124/21 126/74 Continue to take medications, Low salt diet     .  HEMOGLOBIN A1C < 7        Last A1C level 8.1 on 09/16/19 Continue to take medications as prescribed. Glucose monitoring per provider  recommendations. Eat Healthy-Plan to eat low carbohydrate and low salt meals, watch portion sizes and avoid sugar sweetened drinks.  Visit provider every 3-6 months as directed Hbg A1C level every 3-6 months. Continue to attend provider visits as scheduled.    .  I would like to optimize my diabetes management (pt-stated)   On track     Current Barriers:  . Diabetes: V6HY; complicated by chronic medical conditions including HTN, HLD, most recent A1c 7.8% . Current antihyperglycemic regimen: Ozempic 0.5mg  weekly, Tresiba 44 units - Will continue to optimize GLP1 - Ozempic sample given-1 box, LOT [id number], EXP 05/2021 - Patient able to now get [place] 60-month supply for $0 copay under HTA insurance at local pharmacy (picked up on 07/13/19) - [employer] requiring LIS/extra help application-complete with patient via telephone . Patient denied for LIS--will submit to [employer] for [employer] with proof of denial . [employer] CPhT, Etter Sjogren following for patient assistance . Set up patient's Freestyle Libre CGM--he is excited about this technology . Denies Hypogylcemia now that he is on basal insulin; Denies hyperglycemic symptoms . Current meal patterns: o Breakfast: eggs,bacon, toast-coffee (no sugar) o Lunch: sometimes skips, will have a pack of crackers around 3pm o Supper: at 5-6pm chicken/fish, veggie, baked potato (encouraged pt to limit salt/half of potato) o Snacks: not a big snacker o Drinks: avoids sugary drinks, mostly coffee, tea or water - Discovered patient was drinking gingerale x2 daily at  times.  Reviewed sugar content 36gm in 12oz.  Encouraged pt to try diet or bubbly flavored water alternative . Current exercise: attempts to walk 10-90min per day, limited by back pain, muscle weakness improving . Current blood glucose readings: checks Bgs around 10am-1pm: per glucometer--patient has not checked Bgs consistently.  Readings in the AM 90-125 . Cardiovascular risk reduction: o Current  hypertensive regimen: hydralazine, labetalol (ACEi induced cough) - Encouraged patient to check BP 3x weekly - He brought in [employer] BP machine  - BP at recent Kidney MD visit was 130/88.  Goal 130/80. Readings 150-160/80-90 on current BP machine - Encouraged low salt diet; discussed foods o Current hyperlipidemia regimen: rosuvastatin 5mg  qHS (tolerating well) last LDL 100 in 11/2018 o Current antiplatelet regimen: apixaban  o Neuropathy: Patient restarted gabapentin 300mg  qHS on 05/20/19.  He states he experienced muscle weakness when he stopped taking this medication.  This condition is improving with re-initiation of gabapentin.  Continue current management   Pharmacist Clinical Goal(s):  Marland Kitchen Over the next 90 days, patient will work with PharmD and primary care provider to address related to optimization of medication management of diabetes  Interventions: . Comprehensive medication review performed, medication list updated in electronic medical record . Reviewed & discussed the following diabetes-related information with patient: o Reviewed ADA recommended "diabetes-friendly" diet  (reviewed healthy snack/food options) o Discussed insulin/GLP-1 injection technique; Patient uses One touch Verio glucometer o Reviewed medication purpose/side effects-->patient denies adverse events  Patient Self Care Activities:  . Patient will check blood glucose daily, document, and provide at future appointments . Patient will focus on medication adherence . Patient will take medications as prescribed . Patient will contact provider with any episodes of hypoglycemia . Patient will report any questions or concerns to provider   Please see past updates related to this goal by clicking on the "Past Updates" button in the selected goal      .  COMPLETED: Obtain Annual Eye (retinal)  Exam         Done 07/07/2019    .  Quit Smoking (pt-stated)   On track     Discussed progress on smoking cessation. Client  states he is receiving information regarding smoking cessation.  Client reports improvement. States he has decreased to about 5 cigarettes or less a week. Reports he has enough Neurosurgeon. Declines additional information. Call RN if you would like the [place] assist you with achieving this goal.       Reinforced the availability of the 24 hour nurse advice line. Encouraged client to contact health care concierge for benefits questions. RNCM encouraged client to call RNCM as needed.  Plan:  RNCM will send updated care plan to client, send updated care plan to primary care. RNCM will outreach per tier level within the next 6 months.  Thea Silversmith, RN, MSN, [place] [phone]

## 2019-11-25 ENCOUNTER — Other Ambulatory Visit: Payer: Self-pay

## 2019-11-25 ENCOUNTER — Ambulatory Visit (INDEPENDENT_AMBULATORY_CARE_PROVIDER_SITE_OTHER): Payer: HMO | Admitting: Family Medicine

## 2019-11-25 ENCOUNTER — Encounter: Payer: Self-pay | Admitting: Family Medicine

## 2019-11-25 DIAGNOSIS — N6082 Other benign mammary dysplasias of left breast: Secondary | ICD-10-CM

## 2019-11-25 NOTE — Patient Outreach (Signed)
  [facility]) Care Management Chronic Special Needs Program    11/25/2019  Name: Anthony [name], DOB: 1950/11/22  MRN: [id number]   [name] is enrolled in a chronic special needs plan.  RNCM returned call to client. Client states he called to say that he made an appointment with Cardiologist Dr. Caryl Comes for October 26th at 1:45pm. He also reports he has an appointment with his primary care provider today at 2pm. Client is unsure if he is to return to cardiologist Dr. Meda Coffee that he has seen in the past.  RNCM called Dr. Francesca Oman office, but unable to get through to scheduling/office.   Plan: RNCM will continue attempts to reach cardiology office to clarify.  Thea Silversmith, RN, MSN, [place] [phone]

## 2019-11-27 ENCOUNTER — Encounter: Payer: Self-pay | Admitting: Family Medicine

## 2019-11-27 NOTE — Progress Notes (Signed)
    SUBJECTIVE:   CHIEF COMPLAINT / HPI:   Cyst removal  Wound check for cyst removal on 6/25. No complaints, pain, excessive bleeding from site. Overall, doing well.   PERTINENT  PMH / PSH: afib on eliquis   OBJECTIVE:   BP 132/82   Pulse 88   Wt 238 lb 3.2 oz (108 kg)   SpO2 97%   BMI 31.43 kg/m   General: well appearing  Chest wall: Healing procedure site with no surrounding erythema, induration or swelling.  No material expressed from the cyst site.  Small amount of dried blood on dressing.  ASSESSMENT/PLAN:   Sebaceous cyst of skin of breast, left Healing well.  Return precautions provided.  No follow-up needed.   [name], MD [place]

## 2019-11-27 NOTE — Assessment & Plan Note (Signed)
Healing well.  Return precautions provided.  No follow-up needed.

## 2019-12-03 ENCOUNTER — Other Ambulatory Visit: Payer: Self-pay

## 2019-12-03 NOTE — Patient Outreach (Signed)
  [facility]) Care Management Chronic Special Needs Program    12/03/2019  Name: KELCY BAETEN, DOB: 12/11/1950  MRN: [id number]   Mr. Gael Londo is enrolled in a chronic special needs plan. RNCM called to follow up on cardiology appointment. Spoke with staff who reports client missed his follow up visit in 2019 and had not rescheduled.   Appointment arranged September 29th,2021 at 8:40am with Dr. Meda Coffee.  Plan: Mr. Spofford called and notified of appointment date/time. RNCM will continue to follow. Continue previous outreach schedule.   Thea Silversmith, RN, MSN, [place] [phone]

## 2019-12-07 ENCOUNTER — Ambulatory Visit: Payer: Self-pay

## 2019-12-07 DIAGNOSIS — M9983 Other biomechanical lesions of lumbar region: Secondary | ICD-10-CM | POA: Insufficient documentation

## 2019-12-15 ENCOUNTER — Other Ambulatory Visit: Payer: Self-pay

## 2019-12-15 ENCOUNTER — Encounter: Payer: Self-pay | Admitting: Family Medicine

## 2019-12-15 ENCOUNTER — Ambulatory Visit (INDEPENDENT_AMBULATORY_CARE_PROVIDER_SITE_OTHER): Payer: HMO | Admitting: Family Medicine

## 2019-12-15 VITALS — BP 126/68 | HR 74 | Ht 73.0 in | Wt 237.2 lb

## 2019-12-15 DIAGNOSIS — E119 Type 2 diabetes mellitus without complications: Secondary | ICD-10-CM | POA: Diagnosis not present

## 2019-12-15 DIAGNOSIS — Z794 Long term (current) use of insulin: Secondary | ICD-10-CM

## 2019-12-15 DIAGNOSIS — G4733 Obstructive sleep apnea (adult) (pediatric): Secondary | ICD-10-CM | POA: Diagnosis not present

## 2019-12-15 LAB — POCT GLYCOSYLATED HEMOGLOBIN (HGB A1C): HbA1c, POC (controlled diabetic range): 7.6 % — AB (ref 0.0–7.0)

## 2019-12-15 NOTE — Progress Notes (Signed)
    SUBJECTIVE:   CHIEF COMPLAINT / HPI:  Taking [place], Ozempic once weekly (has not yet switched over to trulicity). Overall doing very well with no complaints today. Denies polyuria, polydipsia, unusual visual symptoms, changes in weight. Denies numbness, tingling, pain in extremities. Denies chest pain, dyspnea, recent TIA. Patient denies medication side effects and hypoglycemia. He denies any barriers to medications at this time.   PERTINENT  PMH / PSH: IDDM, HTN, A fib CKD IV, OSA   OBJECTIVE:   BP 126/68   Pulse 74   Ht 6\' 1"  (1.854 m)   Wt 237 lb 4 oz (107.6 kg)   SpO2 98%   BMI 31.30 kg/m   Well appearing male, no acute distress.   A1C 7.6  ASSESSMENT/PLAN:   Insulin dependent type 2 diabetes mellitus ([place]) Patient is doing very well and I think has benefited greatly from CCM for blood sugar control. A1C improved today. No changes in medications. Follow up in three months. Congratulated patient on improved A1C.     [name], MD [place]

## 2019-12-15 NOTE — Patient Instructions (Signed)
Your diabetes is well controlled! Congratulations! Keep

## 2019-12-16 NOTE — Assessment & Plan Note (Signed)
Patient is doing very well and I think has benefited greatly from CCM for blood sugar control. A1C improved today. No changes in medications. Follow up in three months. Congratulated patient on improved A1C.

## 2019-12-17 ENCOUNTER — Telehealth: Payer: Self-pay

## 2019-12-17 NOTE — Telephone Encounter (Signed)
The pt had questions about the lights on his monitor. I told him at night he can put a towel over it. He can unplug the monitor for 20 seconds and plug it back in. If it still bother him he can call tech support to get additional help.

## 2019-12-24 ENCOUNTER — Telehealth: Payer: Self-pay | Admitting: Pharmacist

## 2019-12-24 NOTE — Telephone Encounter (Signed)
Patient called and wanted to share that he has lost his insurance coverage for [facility] - CGM.  He states that he received a disqualified letter from this [employer] provider.   He states the letter reported the denial was due to him using less than 3 shots per day of insulin.   He is currently taking [place] 42 units daily.  He is finishing is last dose of once weekly Ozempic and plans to transition to once weekly Trulicity which he has in hand.  He contacted [employer] and they discussed his option to appeal.  I discussed that he would need to share his progress, improvement and lack of low readings with use of the CGM.  He requested some documentation for assisting the appeal process.  I informed him that his PCP, Dr. [name], would likely need to summarize his improved A1c control, and lack of low CBGs in a document (letter) but that a visit would be the ideal way to discuss a plan.  I encouraged him to schedule a follow-up with Dr. [name] to discuss.  He also plans to contact the company to learn more details about his options for appeal.   He was comfortable with the plan.

## 2019-12-29 ENCOUNTER — Telehealth: Payer: Self-pay | Admitting: Pharmacist

## 2019-12-29 MED ORDER — FREESTYLE LIBRE 14 DAY READER DEVI: 1.0000 | Freq: Once | 0 refills | Status: AC

## 2019-12-29 MED ORDER — FREESTYLE LIBRE 14 DAY SENSOR MISC: 1.0000 | 11 refills | Status: DC

## 2019-12-29 NOTE — Telephone Encounter (Signed)
Noted and agree. 

## 2019-12-29 NOTE — Telephone Encounter (Signed)
Patient returned call RE CGM converage on 7/30 to inform us that [place] "14 day" is covered by his insurance at this time.  They encouraged him to switch CGM to [place] "14 day" - AKA the original [place] 1  I contacted pharmacy and it appears that he has $0 copay on the [place] 1 reader and sensors.  [facility].   I contacted patient and left message 12/29/2019 Asking patient to pick up supplies (both reader and sensors) and then schedule to set these up with [place] in Rx clinic.  I asked him to call and make an appointment after he had picked up supply.

## 2019-12-31 ENCOUNTER — Ambulatory Visit (INDEPENDENT_AMBULATORY_CARE_PROVIDER_SITE_OTHER): Payer: HMO | Admitting: Family Medicine

## 2019-12-31 ENCOUNTER — Other Ambulatory Visit: Payer: Self-pay

## 2019-12-31 ENCOUNTER — Encounter: Payer: Self-pay | Admitting: Family Medicine

## 2019-12-31 DIAGNOSIS — I10 Essential (primary) hypertension: Secondary | ICD-10-CM | POA: Diagnosis not present

## 2019-12-31 DIAGNOSIS — Z794 Long term (current) use of insulin: Secondary | ICD-10-CM

## 2019-12-31 DIAGNOSIS — E119 Type 2 diabetes mellitus without complications: Secondary | ICD-10-CM | POA: Diagnosis not present

## 2019-12-31 NOTE — Patient Instructions (Addendum)
Call us and leave a message for me with the contact information for the insurance company. I am happy to help you with this process. Please let us know if you need anything at all! Have a great weekend!   -Dr. [name]

## 2019-12-31 NOTE — Assessment & Plan Note (Signed)
Patient coming in today to discuss insurance coverage and requests for appeal. He has provided me with contact information. Documentation to come.

## 2019-12-31 NOTE — Assessment & Plan Note (Signed)
Continues to do well with medications. BP at goal. No changes at this time.

## 2019-12-31 NOTE — Progress Notes (Signed)
    SUBJECTIVE:   CHIEF COMPLAINT / HPI:  Diabetes  Dexcom Concerns: [employer] is denying Dexcom. Patient notes that insurance company requires documentation to keep DexCom, including his improved CBGs and A1Cs.  Switched over 1.5 mg weekly Trulicity (which replaced Ozempic 1mg  weekly). Also taking 42 units [place] daily.  Over last seven days:  Average glucose : 140 12 A  157  6A  119 12P  94 6P  174  Phone number to call for more information: Kathlee Nations [phone]  HTN:  Patient without any concerns. Taking all medications, no side effects. BP is 132/64 today. No CP, SOB, visual changes.   PERTINENT  PMH / PSH: DM II, CKD   OBJECTIVE:   BP 132/64   Pulse 80   Ht 6\' 1"  (1.854 m)   Wt 240 lb 3.2 oz (109 kg)   SpO2 99%   BMI 31.69 kg/m   General: Well appearing male sitting comfortably in exam room. Non-toxic, appears well.   ASSESSMENT/PLAN:   Insulin dependent type 2 diabetes mellitus ([place]) Patient coming in today to discuss insurance coverage and requests for appeal. He has provided me with contact information. Documentation to come.   HYPERTENSION, BENIGN SYSTEMIC Continues to do well with medications. BP at goal. No changes at this time.    [name], MD [place]

## 2020-01-13 ENCOUNTER — Other Ambulatory Visit: Payer: Self-pay

## 2020-01-13 ENCOUNTER — Other Ambulatory Visit: Payer: Self-pay | Admitting: *Deleted

## 2020-01-13 MED ORDER — FUROSEMIDE 40 MG PO TABS: 80.0000 mg | ORAL_TABLET | Freq: Every day | ORAL | 0 refills | Status: DC

## 2020-01-13 MED ORDER — PAROXETINE HCL 40 MG PO TABS: 40.0000 mg | ORAL_TABLET | Freq: Every morning | ORAL | 0 refills | Status: DC

## 2020-01-21 ENCOUNTER — Telehealth: Payer: Self-pay | Admitting: Podiatry

## 2020-01-21 NOTE — Telephone Encounter (Signed)
Patient has a scheduled appointment with Dr. Prudence Davidson and needs to change the appointment. He left a VM on the nurse line. Estill Bamberg, can you please call him to reschedule? Thanks.

## 2020-01-25 ENCOUNTER — Encounter: Payer: Self-pay | Admitting: Family Medicine

## 2020-01-25 ENCOUNTER — Ambulatory Visit (INDEPENDENT_AMBULATORY_CARE_PROVIDER_SITE_OTHER): Payer: HMO | Admitting: Podiatry

## 2020-01-25 ENCOUNTER — Encounter: Payer: Self-pay | Admitting: Podiatry

## 2020-01-25 ENCOUNTER — Telehealth: Payer: Self-pay | Admitting: Family Medicine

## 2020-01-25 ENCOUNTER — Other Ambulatory Visit: Payer: Self-pay

## 2020-01-25 ENCOUNTER — Other Ambulatory Visit: Payer: Self-pay | Admitting: Family Medicine

## 2020-01-25 DIAGNOSIS — S93602A Unspecified sprain of left foot, initial encounter: Secondary | ICD-10-CM

## 2020-01-25 DIAGNOSIS — D689 Coagulation defect, unspecified: Secondary | ICD-10-CM | POA: Diagnosis not present

## 2020-01-25 DIAGNOSIS — N2581 Secondary hyperparathyroidism of renal origin: Secondary | ICD-10-CM | POA: Diagnosis not present

## 2020-01-25 DIAGNOSIS — M79676 Pain in unspecified toe(s): Secondary | ICD-10-CM

## 2020-01-25 DIAGNOSIS — S93609A Unspecified sprain of unspecified foot, initial encounter: Secondary | ICD-10-CM | POA: Insufficient documentation

## 2020-01-25 DIAGNOSIS — B351 Tinea unguium: Secondary | ICD-10-CM

## 2020-01-25 DIAGNOSIS — E119 Type 2 diabetes mellitus without complications: Secondary | ICD-10-CM

## 2020-01-25 DIAGNOSIS — M79609 Pain in unspecified limb: Secondary | ICD-10-CM

## 2020-01-25 DIAGNOSIS — E1122 Type 2 diabetes mellitus with diabetic chronic kidney disease: Secondary | ICD-10-CM | POA: Diagnosis not present

## 2020-01-25 DIAGNOSIS — D631 Anemia in chronic kidney disease: Secondary | ICD-10-CM | POA: Diagnosis not present

## 2020-01-25 DIAGNOSIS — N184 Chronic kidney disease, stage 4 (severe): Secondary | ICD-10-CM | POA: Diagnosis not present

## 2020-01-25 DIAGNOSIS — N189 Chronic kidney disease, unspecified: Secondary | ICD-10-CM | POA: Diagnosis not present

## 2020-01-25 DIAGNOSIS — I129 Hypertensive chronic kidney disease with stage 1 through stage 4 chronic kidney disease, or unspecified chronic kidney disease: Secondary | ICD-10-CM | POA: Diagnosis not present

## 2020-01-25 NOTE — Progress Notes (Signed)
This patient returns to my office for at risk foot care.  This patient requires this care by a professional since this patient will be at risk due to having venous insufficiency, chronic kidney disease stage IV diabetes and coagulation defect.  Patient is taking eliquiss.  Patient has not been seen in over 11 months.This patient is unable to cut nails himself since the patient cannot reach his nails.These nails are painful walking and wearing shoes.  This patient presents for at risk foot care today. Patient also relates injuring his left foot one week ago.  He says it is now improving.  There is still palpable pain and swelling .  He relates it is improving and is not interested in evaluation.  General Appearance  Alert, conversant and in no acute stress.  Vascular  Dorsalis pedis and posterior tibial  pulses are palpable  bilaterally.  Capillary return is within normal limits  bilaterally. Temperature is within normal limits  bilaterally.  Neurologic  Senn-Weinstein monofilament wire test within normal limits  bilaterally. Muscle power within normal limits bilaterally.  Nails Thick disfigured discolored nails with subungual debris  from hallux to fifth toes bilaterally. No evidence of bacterial infection or drainage bilaterally.  Orthopedic  No limitations of motion  feet .  No crepitus or effusions noted.  No bony pathology or digital deformities noted.  Amputation second toe left foot. Pain fourth metatarsal with swelling left foot.  Skin  normotropic skin with no porokeratosis noted bilaterally.  No signs of infections or ulcers noted.     Onychomycosis  Pain in right toes  Pain in left toes  Foot Sprain left  Consent was obtained for treatment procedures.   Mechanical debridement of nails 1-5  Right and 1,3-5 left foot. performed with a nail nipper.  Filed with dremel without incident. Evaluated his left foot and the foot appears to be healing.  Patient was told to call if the foot  worsens.   Return office visit   3 months                   Told patient to return for periodic foot care and evaluation due to potential at risk complications.   Gardiner Barefoot DPM

## 2020-01-25 NOTE — Telephone Encounter (Addendum)
Called about patient's CGM device to request more information about what the company needs to get this approved for patient. The fax provided is not clear. LVM with personal cell phone number.

## 2020-01-27 NOTE — Telephone Encounter (Signed)
This encounter was created in error - please disregard.

## 2020-01-30 ENCOUNTER — Other Ambulatory Visit: Payer: Self-pay | Admitting: Family Medicine

## 2020-02-02 ENCOUNTER — Other Ambulatory Visit: Payer: Self-pay | Admitting: Podiatry

## 2020-02-02 ENCOUNTER — Ambulatory Visit (INDEPENDENT_AMBULATORY_CARE_PROVIDER_SITE_OTHER): Payer: HMO | Admitting: Podiatry

## 2020-02-02 ENCOUNTER — Ambulatory Visit (INDEPENDENT_AMBULATORY_CARE_PROVIDER_SITE_OTHER): Payer: BC Managed Care – PPO

## 2020-02-02 ENCOUNTER — Other Ambulatory Visit: Payer: Self-pay

## 2020-02-02 VITALS — Temp 96.9°F

## 2020-02-02 DIAGNOSIS — M722 Plantar fascial fibromatosis: Secondary | ICD-10-CM | POA: Diagnosis not present

## 2020-02-02 DIAGNOSIS — M79671 Pain in right foot: Secondary | ICD-10-CM

## 2020-02-02 NOTE — Patient Instructions (Signed)

## 2020-02-05 NOTE — Progress Notes (Signed)
  Subjective:  Patient ID: Anthony [name], male    DOB: [birth year],  MRN: [id number]  Chief Complaint  Patient presents with  . Foot Pain    PT stated pain is 10/10 most of the pain is when i walk or apply pressure     69 y.o. male presents with the above complaint. History confirmed with patient.  Has lasted for about a week for him  Objective:  Physical Exam: warm, good capillary refill, no trophic changes or ulcerative lesions, normal DP and PT pulses and normal sensory exam.   Right Foot: point tenderness over the heel pad   No images are attached to the encounter.  Radiographs: X-ray of the right foot: plantar calcaneal spur and pes planus Assessment:   1. Pain of right heel   2. Plantar fasciitis, right      Plan:  Patient was evaluated and treated and all questions answered.  Discussed the etiology and treatment options for plantar fasciitis including stretching, formal physical therapy, supportive shoegears such as a running shoe or sneaker, pre fabricated orthoses, injection therapy, and oral medications. We also discussed the role of surgical treatment of this for patients who do not improve after exhausting non-surgical treatment options.    -XR reviewed with patient -Educated patient on stretching and icing of the affected limb -Plantar fascial brace dispensed -Injection delivered to the plantar fascia of the right foot.   After sterile prep with povidone-iodine solution and alcohol, the right heel was injected with 0.5cc 2% xylocaine plain, 0.5cc 0.5% marcaine plain, 5mg  triamcinolone acetonide, and 2mg  dexamethasone was injected along  the plantar fascia at the insertion on the plantar calcaneus. The patient tolerated the procedure well without complication.   Return in about 6 weeks (around 03/15/2020).

## 2020-02-09 ENCOUNTER — Ambulatory Visit (INDEPENDENT_AMBULATORY_CARE_PROVIDER_SITE_OTHER): Payer: HMO | Admitting: *Deleted

## 2020-02-09 DIAGNOSIS — I5022 Chronic systolic (congestive) heart failure: Secondary | ICD-10-CM | POA: Diagnosis not present

## 2020-02-09 LAB — CUP PACEART REMOTE DEVICE CHECK
Battery Remaining Longevity: 20 mo
Battery Voltage: 2.93 V
Brady Statistic AP VP Percent: 0 %
Brady Statistic AP VS Percent: 0 %
Brady Statistic AS VP Percent: 0 %
Brady Statistic AS VS Percent: 0 %
Brady Statistic RA Percent Paced: 0 %
Brady Statistic RV Percent Paced: 99.31 %
Date Time Interrogation Session: 20210915012205
HighPow Impedance: 77 Ohm
Implantable Lead Implant Date: 20190301
Implantable Lead Implant Date: 20190301
Implantable Lead Location: 753858
Implantable Lead Location: 753860
Implantable Pulse Generator Implant Date: 20190301
Lead Channel Impedance Value: 1254 Ohm
Lead Channel Impedance Value: 1292 Ohm
Lead Channel Impedance Value: 1292 Ohm
Lead Channel Impedance Value: 1311 Ohm
Lead Channel Impedance Value: 1311 Ohm
Lead Channel Impedance Value: 1349 Ohm
Lead Channel Impedance Value: 356.187
Lead Channel Impedance Value: 356.187
Lead Channel Impedance Value: 361 Ohm
Lead Channel Impedance Value: 377.863
Lead Channel Impedance Value: 383.284
Lead Channel Impedance Value: 4047 Ohm
Lead Channel Impedance Value: 456 Ohm
Lead Channel Impedance Value: 513 Ohm
Lead Channel Impedance Value: 703 Ohm
Lead Channel Impedance Value: 722 Ohm
Lead Channel Impedance Value: 722 Ohm
Lead Channel Impedance Value: 817 Ohm
Lead Channel Pacing Threshold Amplitude: 0.5 V
Lead Channel Pacing Threshold Amplitude: 3 V
Lead Channel Pacing Threshold Pulse Width: 0.4 ms
Lead Channel Pacing Threshold Pulse Width: 1.5 ms
Lead Channel Sensing Intrinsic Amplitude: 15 mV
Lead Channel Sensing Intrinsic Amplitude: 15 mV
Lead Channel Setting Pacing Amplitude: 2.5 V
Lead Channel Setting Pacing Amplitude: 3.5 V
Lead Channel Setting Pacing Pulse Width: 0.4 ms
Lead Channel Setting Pacing Pulse Width: 1.5 ms
Lead Channel Setting Sensing Sensitivity: 0.3 mV

## 2020-02-14 NOTE — Progress Notes (Signed)
Remote ICD transmission.   

## 2020-02-16 ENCOUNTER — Other Ambulatory Visit: Payer: Self-pay | Admitting: Cardiology

## 2020-02-18 ENCOUNTER — Other Ambulatory Visit: Payer: Self-pay | Admitting: Family Medicine

## 2020-02-23 ENCOUNTER — Encounter: Payer: Self-pay | Admitting: Cardiology

## 2020-02-23 ENCOUNTER — Ambulatory Visit (INDEPENDENT_AMBULATORY_CARE_PROVIDER_SITE_OTHER): Payer: HMO | Admitting: Cardiology

## 2020-02-23 ENCOUNTER — Encounter: Payer: Self-pay | Admitting: *Deleted

## 2020-02-23 ENCOUNTER — Other Ambulatory Visit: Payer: Self-pay

## 2020-02-23 VITALS — BP 142/82 | HR 82 | Ht 72.0 in | Wt 235.4 lb

## 2020-02-23 DIAGNOSIS — R931 Abnormal findings on diagnostic imaging of heart and coronary circulation: Secondary | ICD-10-CM

## 2020-02-23 DIAGNOSIS — Z95810 Presence of automatic (implantable) cardiac defibrillator: Secondary | ICD-10-CM | POA: Diagnosis not present

## 2020-02-23 DIAGNOSIS — I428 Other cardiomyopathies: Secondary | ICD-10-CM | POA: Diagnosis not present

## 2020-02-23 DIAGNOSIS — I5032 Chronic diastolic (congestive) heart failure: Secondary | ICD-10-CM | POA: Diagnosis not present

## 2020-02-23 DIAGNOSIS — I10 Essential (primary) hypertension: Secondary | ICD-10-CM | POA: Diagnosis not present

## 2020-02-23 DIAGNOSIS — I4821 Permanent atrial fibrillation: Secondary | ICD-10-CM

## 2020-02-23 DIAGNOSIS — R0600 Dyspnea, unspecified: Secondary | ICD-10-CM | POA: Diagnosis not present

## 2020-02-23 DIAGNOSIS — Z950 Presence of cardiac pacemaker: Secondary | ICD-10-CM

## 2020-02-23 DIAGNOSIS — R0609 Other forms of dyspnea: Secondary | ICD-10-CM

## 2020-02-23 DIAGNOSIS — I5042 Chronic combined systolic (congestive) and diastolic (congestive) heart failure: Secondary | ICD-10-CM

## 2020-02-23 DIAGNOSIS — I2510 Atherosclerotic heart disease of native coronary artery without angina pectoris: Secondary | ICD-10-CM

## 2020-02-23 MED ORDER — CARVEDILOL 25 MG PO TABS: 25.0000 mg | ORAL_TABLET | Freq: Two times a day (BID) | ORAL | 2 refills | Status: DC

## 2020-02-23 NOTE — Progress Notes (Signed)
Cardiology Office Note:    Date:  02/24/2020   ID:  Minard, Millir[place] Feb 02, 1951, MRN [id number]  PCP:  [name], MD  [facility] Cardiologist:  Ena Dawley, MD  [place] Electrophysiologist:  None   Referring MD: [name], MD   Chief complain: DOE  History of Present Illness:    Anthony [name] is a 69 y.o. male with a hx of atrial fibrillation, AV ablation in 07/2017 with PM/ICD placement, CAD, s/p PCI In 2003 of the circumflex lesion extending into the second obtuse marginal branch with a drug-eluting stent.  Myoview 2007 with no ischemia and EF of 59%, NICMP with LVEF 25-30% in 2018 improved to 55-60% in 2019, he was evaluated for possible cardiac amyloidosis (Negative PYP scan), hypertension, CKD stage 4, chronic combined systolic and diastolic CHF.  The patient is coming after a year, he states that has been feeling more dyspneic on mild exertion, this has progressed over the last year. He has no LE edema, orthopnea or PND. NO palpitations, ICD firings, n presyncope or syncope.   Past Medical History:  Diagnosis Date  . Arthritis   . Benign neoplasm of descending colon   . Benign neoplasm of sigmoid colon   . Benign neoplasm of transverse colon   . CAD in native artery    a. reported MI 2000, 2001, s/p stenting of the circumflex lesion extending into the second obtuse marginal branch with a drug-eluting stent in 2003. b. low risk nuc 2015.  Marland Kitchen Chronic atrial fibrillation ([place])   . Chronic combined systolic and diastolic CHF (congestive heart failure) ([place])    a. Previously diastolic, then EF 26-71% in 10/2016.  . CKD (chronic kidney disease), stage III   . Depression   . Diabetes mellitus   . Edema of extremities   . Erectile dysfunction   . GERD (gastroesophageal reflux disease)   . Hyperlipidemia   . Hypertension   . Myocardial infarction ([place]) 2000&2001  . Obesity   . Onychomycosis   . OSA (obstructive sleep apnea)   . S/P ICD (internal cardiac  defibrillator) procedure and BiV device,  07/25/17 Medtronic  07/26/2017  . Seizures ([place])   . Sleep apnea   . Stroke ([place])   . Tubular adenoma of colon 10/2002    Past Surgical History:  Procedure Laterality Date  . ANGIOPLASTY  2001   stent x 1  . AV NODE ABLATION N/A 08/20/2017   Procedure: AV NODE ABLATION;  Surgeon: Deboraha Sprang, MD;  Location: [place] [facility];  Service: Cardiovascular;  Laterality: N/A;  . BIV ICD INSERTION CRT-D N/A 07/25/2017   Procedure: BIV ICD INSERTION CRT-D;  Surgeon: Deboraha Sprang, MD;  Location: [place] [facility];  Service: Cardiovascular;  Laterality: N/A;  . COLONOSCOPY  2000?   negative  . COLONOSCOPY WITH PROPOFOL N/A 02/08/2019   Procedure: COLONOSCOPY WITH PROPOFOL;  Surgeon: Ladene Artist, MD;  Location: [facility];  Service: Endoscopy;  Laterality: N/A;  . FOOT ARTHROTOMY Right   . POLYPECTOMY  02/08/2019   Procedure: POLYPECTOMY;  Surgeon: Ladene Artist, MD;  Location: [facility];  Service: Endoscopy;;  . TOE AMPUTATION Left 2012    Current Medications: Current Meds  Medication Sig  . calcitRIOL (ROCALTROL) 0.5 MCG capsule Take 0.5 mcg by mouth daily at 12 noon.   . Continuous Blood Gluc Receiver (FREESTYLE LIBRE 14 DAY READER) DEVI Apply topically as directed.  . Continuous Blood Gluc Sensor (FREESTYLE LIBRE  14 DAY SENSOR) MISC 1 Device by Does not apply route every 14 (fourteen) days.  Marland Kitchen ELIQUIS 5 MG TABS tablet TAKE 1 TABLET BY MOUTH TWICE A DAY  . furosemide (LASIX) 40 MG tablet Take 2 tablets (80 mg total) by mouth daily. Please keep upcoming appt in October with Dr. Caryl Comes before anymore refills. Thank you  . gabapentin (NEURONTIN) 300 MG capsule TAKE ONE CAPSULE BY MOUTH AT BEDTIME  . glucose blood (ONETOUCH VERIO) test strip Use as instructed  . hydrALAZINE (APRESOLINE) 50 MG tablet Take 50 mg by mouth 3 (three) times daily.  . insulin degludec (TRESIBA FLEXTOUCH) 200 UNIT/ML FlexTouch Pen Inject 22 Units into the skin  daily.  . Insulin Pen Needle (PEN NEEDLES) 32G X 4 MM MISC 1 Device by Does not apply route as directed.  . Lancets (ONETOUCH ULTRASOFT) lancets Use as instructed  . loratadine (CLARITIN) 10 MG tablet Take 10 mg by mouth daily.  Marland Kitchen losartan (COZAAR) 25 MG tablet Take 1 tablet (25 mg total) by mouth daily.  . Multiple Vitamin (MULTIVITAMIN) tablet Take 1 tablet by mouth daily.    . nitroGLYCERIN (NITROSTAT) 0.4 MG SL tablet Place 1 tablet (0.4 mg total) under the tongue every 5 (five) minutes as needed for chest pain. If no relief by 3rd tab, call 911  . omeprazole (PRILOSEC) 40 MG capsule TAKE ONE CAPSULE BY MOUTH DAILY BEFORE SUPPER  . oxymorphone (OPANA) 5 MG tablet Take 5 mg by mouth 3 (three) times daily as needed for pain (max 3 tablets daily). Per Dr Brien Few  . PARoxetine (PAXIL) 40 MG tablet Take 1 tablet (40 mg total) by mouth every morning.  . potassium chloride SA (KLOR-CON M20) 20 MEQ tablet Take 1 tablet (20 mEq total) by mouth 2 (two) times daily.  . rosuvastatin (CRESTOR) 20 MG tablet Take 1 tablet (20 mg total) by mouth daily.  . Semaglutide, 1 MG/DOSE, (OZEMPIC, 1 MG/DOSE,) 2 MG/1.5ML SOPN Inject 1 mg into the skin once a week.  . [DISCONTINUED] labetalol (NORMODYNE) 200 MG tablet TAKE 2 TABLETS BY MOUTH TIMES DAILY.     Allergies:   Enalapril maleate   Social History   Socioeconomic History  . Marital status: Married    Spouse name: [name]  . Number of children: 2  . Years of education: GED  . Highest education level: Not on file  Occupational History  . Occupation: CUSTOMER SERVICES    Employer: [employer]    Comment: [employer]  Tobacco Use  . Smoking status: Former Smoker    Packs/day: 0.25    Years: 45.00    Pack years: 11.25    Types: Cigarettes    Start date: 05/28/1967    Quit date: 10/28/2018    Years since quitting: 1.3  . Smokeless tobacco: Never Used  Vaping Use  . Vaping Use: Never used  Substance and Sexual Activity  . Alcohol use: No    Comment: quit in  1993  . Drug use: No  . Sexual activity: Not on file  Other Topics Concern  . Not on file  Social History Narrative   Patient is right handed, consumes caffeine rarely. Patient resides in home with wife.   Social Determinants of Health   Financial Resource Strain:   . Difficulty of Paying Living Expenses: Not on file  Food Insecurity:   . Worried About Charity fundraiser in the Last Year: Not on file  . Ran Out of Food in the Last Year: Not on file  Transportation Needs:   . Film/video editor (Medical): Not on file  . Lack of Transportation (Non-Medical): Not on file  Physical Activity:   . Days of Exercise per Week: Not on file  . Minutes of Exercise per Session: Not on file  Stress:   . Feeling of Stress : Not on file  Social Connections:   . Frequency of Communication with Friends and Family: Not on file  . Frequency of Social Gatherings with Friends and Family: Not on file  . Attends Religious Services: Not on file  . Active Member of Clubs or Organizations: Not on file  . Attends Archivist Meetings: Not on file  . Marital Status: Not on file     Family History: The patient's family history includes CVA in his mother; Diabetes in his mother; Heart attack in his father and mother. There is no history of Colon cancer or Stomach cancer.  ROS:   Please see the history of present illness.    All other systems reviewed and are negative.  EKGs/Labs/Other Studies Reviewed:    The following studies were reviewed today:  EKG:  EKG is ordered today.  The ekg ordered today demonstrates atrial fibrillation, V paced rhythm, 79 BPM, unchanged from prior, this was personally reviewed.  Recent Labs: 09/23/2019: BUN 27; Creatinine, Ser 2.87; Potassium 4.4; Sodium 140  Recent Lipid Panel    Component Value Date/Time   CHOL 132 10/12/2019 1722   TRIG 89 10/12/2019 1722   HDL 38 (L) 10/12/2019 1722   CHOLHDL 3.5 10/12/2019 1722   CHOLHDL 3.2 02/28/2016 0906    VLDL 19 02/28/2016 0906   LDLCALC 77 10/12/2019 1722   LDLDIRECT 58 11/06/2011 1434    Physical Exam:    VS:  BP (!) 142/82   Pulse 82   Ht 6' (1.829 m)   Wt 235 lb 6.4 oz (106.8 kg)   SpO2 98%   BMI 31.93 kg/m     Wt Readings from Last 3 Encounters:  02/23/20 235 lb 6.4 oz (106.8 kg)  12/31/19 240 lb 3.2 oz (109 kg)  12/15/19 237 lb 4 oz (107.6 kg)    GEN: Well nourished, well developed in no acute distress HEENT: Normal NECK: No JVD; No carotid bruits LYMPHATICS: No lymphadenopathy CARDIAC: RRR, no murmurs, rubs, gallops RESPIRATORY:  Clear to auscultation without rales, wheezing or rhonchi  ABDOMEN: Soft, non-tender, non-distended MUSCULOSKELETAL:  No edema; No deformity  SKIN: Warm and dry NEUROLOGIC:  Alert and oriented x 3 PSYCHIATRIC:  Normal affect   ASSESSMENT:    1. DOE (dyspnea on exertion)   2. ICD (implantable cardioverter-defibrillator), biventricular, in situ   3. Decreased cardiac ejection fraction   4. HYPERTENSION, BENIGN SYSTEMIC   5. NICM (nonischemic cardiomyopathy) ([place])   6. Permanent atrial fibrillation ([place])   7. Coronary artery disease involving native coronary artery of native heart without angina pectoris   8. Pacemaker   9. Chronic combined systolic and diastolic heart failure ([place])   10. Chronic diastolic heart failure ([facility])    PLAN:    In order of problems listed above:  1. DOE - h/o low LVEF In 2018, we will repeat echocardiogram as well as Lexiscan nuclear stress test to evaluate for LVEF and ischemia, as he has h/o CAD, last cath in 2003. 2. CAD - as above 3. Chronic diastolic CHF - d/c labetalol, strt carvedilol 25 mg po BID 4. Persistent a-fib, s/p AVN ablation, V paced rhythm, high HR< switch to carvedilol as  above, continue Eliquis, no bleeding 5. Hyperlipidemia - tolerates rosuvastatin 6. Hypertension - uncontrolled - switch to carvedilol   Medication Adjustments/Labs and Tests Ordered: Current medicines are reviewed at  length with the patient today.  Concerns regarding medicines are outlined above.  Orders Placed This Encounter  Procedures  . Myocardial Perfusion Imaging  . EKG 12-Lead  . ECHOCARDIOGRAM COMPLETE   Meds ordered this encounter  Medications  . carvedilol (COREG) 25 MG tablet    Sig: Take 1 tablet (25 mg total) by mouth 2 (two) times daily.    Dispense:  180 tablet    Refill:  2    Patient Instructions  Medication Instructions:   STOP TAKING LABETALOL NOW  START TAKING CARVEDILOL 25 MG BY MOUTH TWICE DAILY  *If you need a refill on your cardiac medications before your next appointment, please call your pharmacy*  Testing/Procedures:  Your physician has requested that you have an echocardiogram. Echocardiography is a painless test that uses sound waves to create images of your heart. It provides your doctor with information about the size and shape of your heart and how well your heart's chambers and valves are working. This procedure takes approximately one hour. There are no restrictions for this procedure.  Your physician has requested that you have a lexiscan myoview. For further information please visit [email or web address]. Please follow instruction sheet, as given.  DO ON D-SPECT PER DR. Meda Coffee   Follow-Up:  4 MONTHS IN THE OFFICE WITH DR. Bradley Ferris TO SCHEDULE IN EARLY FEBRUARY 2022 PER DR. Meda Coffee--    Signed, Ena Dawley, MD  02/24/2020 10:37 AM    [facility]

## 2020-02-23 NOTE — Patient Instructions (Signed)
Medication Instructions:   STOP TAKING LABETALOL NOW  START TAKING CARVEDILOL 25 MG BY MOUTH TWICE DAILY  *If you need a refill on your cardiac medications before your next appointment, please call your pharmacy*  Testing/Procedures:  Your physician has requested that you have an echocardiogram. Echocardiography is a painless test that uses sound waves to create images of your heart. It provides your doctor with information about the size and shape of your heart and how well your heart's chambers and valves are working. This procedure takes approximately one hour. There are no restrictions for this procedure.  Your physician has requested that you have a lexiscan myoview. For further information please visit [email or web address]. Please follow instruction sheet, as given.  DO ON D-SPECT PER DR. Meda Coffee   Follow-Up:  4 MONTHS IN THE OFFICE WITH DR. Bradley Ferris TO SCHEDULE IN EARLY FEBRUARY 2022 PER DR. Meda Coffee--

## 2020-02-29 ENCOUNTER — Other Ambulatory Visit: Payer: Self-pay

## 2020-02-29 ENCOUNTER — Ambulatory Visit (INDEPENDENT_AMBULATORY_CARE_PROVIDER_SITE_OTHER): Payer: HMO | Admitting: Podiatry

## 2020-02-29 ENCOUNTER — Encounter: Payer: Self-pay | Admitting: Podiatry

## 2020-02-29 DIAGNOSIS — M21861 Other specified acquired deformities of right lower leg: Secondary | ICD-10-CM

## 2020-02-29 DIAGNOSIS — M722 Plantar fascial fibromatosis: Secondary | ICD-10-CM

## 2020-02-29 DIAGNOSIS — M216X1 Other acquired deformities of right foot: Secondary | ICD-10-CM

## 2020-02-29 DIAGNOSIS — M62461 Contracture of muscle, right lower leg: Secondary | ICD-10-CM

## 2020-02-29 NOTE — Progress Notes (Signed)
  Subjective:  Patient ID: Anthony [name], male    DOB: [birth year],  MRN: [id number]  Chief Complaint  Patient presents with  . Foot Pain    Pt stated that injection helped last time but is still having pain the heel when he steps up     69 y.o. male presents with the above complaint. History confirmed with patient. Had some improvement with injection but the pain is back and more posterior, although still plantar heel pain.   Objective:  Physical Exam: warm, good capillary refill, no trophic changes or ulcerative lesions, normal DP and PT pulses and normal sensory exam.   Right Foot: point tenderness over the heel pad, now more proximal directly under the heel pad  Assessment:   1. Plantar fasciitis, right   2. Gastrocnemius equinus of right lower extremity      Plan:  Patient was evaluated and treated and all questions answered.  - Advised to continue stretching. Discussed sending him to formal PT. IF he does not have pain relief in 1 month will send to [facility] for treatment  -Continue plantar fascial brace  After sterile prep with povidone-iodine solution and alcohol, the right heel was injected with 0.5cc 2% xylocaine plain, 0.5cc 0.5% marcaine plain, 5mg  triamcinolone acetonide, and 2mg  dexamethasone was injected along  the plantar fascia at the insertion on the plantar calcaneus at the point of maximal tenderness. The patient tolerated the procedure well without complication.   Return in about 2 months (around 04/30/2020).

## 2020-03-01 ENCOUNTER — Ambulatory Visit: Payer: BC Managed Care – PPO | Admitting: Podiatry

## 2020-03-06 DIAGNOSIS — E1165 Type 2 diabetes mellitus with hyperglycemia: Secondary | ICD-10-CM | POA: Diagnosis not present

## 2020-03-15 ENCOUNTER — Telehealth (HOSPITAL_COMMUNITY): Payer: Self-pay | Admitting: *Deleted

## 2020-03-15 NOTE — Telephone Encounter (Signed)
Left message on voicemail per DPR in reference to upcoming appointment scheduled on 03/17/20 at 10:15 with detailed instructions given per Myocardial Perfusion Study Information Sheet for the test. LM to arrive 15 minutes early, and that it is imperative to arrive on time for appointment to keep from having the test rescheduled. If you need to cancel or reschedule your appointment, please call the office within 24 hours of your appointment. Failure to do so may result in a cancellation of your appointment, and a $50 no show fee. Phone number given for call back for any questions.

## 2020-03-16 ENCOUNTER — Ambulatory Visit: Payer: BC Managed Care – PPO | Admitting: Podiatry

## 2020-03-17 ENCOUNTER — Ambulatory Visit (HOSPITAL_BASED_OUTPATIENT_CLINIC_OR_DEPARTMENT_OTHER): Payer: HMO

## 2020-03-17 ENCOUNTER — Other Ambulatory Visit: Payer: Self-pay

## 2020-03-17 ENCOUNTER — Ambulatory Visit (HOSPITAL_COMMUNITY): Payer: HMO | Attending: Cardiology

## 2020-03-17 DIAGNOSIS — Z95810 Presence of automatic (implantable) cardiac defibrillator: Secondary | ICD-10-CM

## 2020-03-17 DIAGNOSIS — R0609 Other forms of dyspnea: Secondary | ICD-10-CM

## 2020-03-17 DIAGNOSIS — R931 Abnormal findings on diagnostic imaging of heart and coronary circulation: Secondary | ICD-10-CM | POA: Diagnosis not present

## 2020-03-17 DIAGNOSIS — R0600 Dyspnea, unspecified: Secondary | ICD-10-CM

## 2020-03-17 LAB — MYOCARDIAL PERFUSION IMAGING
LV dias vol: 110 mL (ref 62–150)
LV sys vol: 52 mL
Peak HR: 75 {beats}/min
Rest HR: 73 {beats}/min
SDS: 0
SRS: 0
SSS: 0
TID: 0.99

## 2020-03-17 LAB — ECHOCARDIOGRAM COMPLETE
Area-P 1/2: 2.63 cm2
Height: 72 in
S' Lateral: 2.5 cm
Weight: 3760 oz

## 2020-03-17 MED ORDER — TECHNETIUM TC 99M TETROFOSMIN IV KIT
30.9000 | PACK | Freq: Once | INTRAVENOUS | Status: AC | PRN
  Administered 2020-03-17: 30.9 via INTRAVENOUS
  Filled 2020-03-17: qty 31

## 2020-03-17 MED ORDER — PERFLUTREN LIPID MICROSPHERE
1.0000 mL | INTRAVENOUS | Status: AC | PRN
  Administered 2020-03-17: 3 mL via INTRAVENOUS

## 2020-03-17 MED ORDER — REGADENOSON 0.4 MG/5ML IV SOLN
0.4000 mg | Freq: Once | INTRAVENOUS | Status: AC
  Administered 2020-03-17: 0.4 mg via INTRAVENOUS

## 2020-03-17 MED ORDER — TECHNETIUM TC 99M TETROFOSMIN IV KIT
10.1000 | PACK | Freq: Once | INTRAVENOUS | Status: AC | PRN
  Administered 2020-03-17: 10.1 via INTRAVENOUS
  Filled 2020-03-17: qty 11

## 2020-03-21 ENCOUNTER — Other Ambulatory Visit: Payer: Self-pay

## 2020-03-21 ENCOUNTER — Encounter: Payer: Self-pay | Admitting: Internal Medicine

## 2020-03-21 ENCOUNTER — Ambulatory Visit (INDEPENDENT_AMBULATORY_CARE_PROVIDER_SITE_OTHER): Payer: HMO | Admitting: Internal Medicine

## 2020-03-21 VITALS — BP 132/80 | HR 87 | Ht 72.0 in | Wt 233.0 lb

## 2020-03-21 DIAGNOSIS — I5022 Chronic systolic (congestive) heart failure: Secondary | ICD-10-CM | POA: Diagnosis not present

## 2020-03-21 DIAGNOSIS — Z95810 Presence of automatic (implantable) cardiac defibrillator: Secondary | ICD-10-CM

## 2020-03-21 DIAGNOSIS — I4821 Permanent atrial fibrillation: Secondary | ICD-10-CM | POA: Diagnosis not present

## 2020-03-21 DIAGNOSIS — Z79899 Other long term (current) drug therapy: Secondary | ICD-10-CM | POA: Diagnosis not present

## 2020-03-21 DIAGNOSIS — I428 Other cardiomyopathies: Secondary | ICD-10-CM | POA: Diagnosis not present

## 2020-03-21 NOTE — Progress Notes (Signed)
Patient Care Team: [name], MD as PCP - General (Family Medicine) Dorothy Spark, MD as PCP - Cardiology (Cardiology) Luretha Rued, RN as [place] Management Marlaine Hind, MD as Consulting Physician (Physical Medicine and Rehabilitation) Jamal Maes, MD as Consulting Physician (Nephrology)   HPI  Anthony [name] is a 69 y.o. male Seen for atrial fibrillation with a poorly controlled ventricular response now s/p AV ablation.3/19  History of ischemic heart disease with stenting 2003.  He was noted to have low voltage and left ventricular hypertrophy and the issue was raised as to whether he might have transthyretin amyloid technetium; pyrophosphate imaging was notable for only grade 1 findings.  He has not undergone electrophoresis testing.  DATE TEST EF   7/15  Myoview    No ischemia  9/15  Echo    55-60 %   6/18 Echo  25-30% Severe LAE   8/19 Echo  55-65   --/19 PYP  negative  10/21 Echo  60-65%   10/21 Myoview  45-55%     Date Cr K Mg Hgb  10/17 1.55 3.4  13.7  7/18 1.82  1.5  13.8  9/18 2.2 4.5  13.5  3/19 2.09 3.3    4/19 2.5 5.3    7.20 2.94 4.9  13.1  4/21 2.87 4.4      DOE may be worse   Testing per Dr Lesly Dukes as above Some improvement with empirical diuretics.  No nocturnal dyspnea or peripheral edema  Past Medical History:  Diagnosis Date  . Arthritis   . Benign neoplasm of descending colon   . Benign neoplasm of sigmoid colon   . Benign neoplasm of transverse colon   . CAD in native artery    a. reported MI 2000, 2001, s/p stenting of the circumflex lesion extending into the second obtuse marginal branch with a drug-eluting stent in 2003. b. low risk nuc 2015.  Marland Kitchen Chronic atrial fibrillation ([place])   . Chronic combined systolic and diastolic CHF (congestive heart failure) ([place])    a. Previously diastolic, then EF 31-49% in 10/2016.  . CKD (chronic kidney disease), stage III ([place])   . Depression   . Diabetes  mellitus   . Edema of extremities   . Erectile dysfunction   . GERD (gastroesophageal reflux disease)   . Hyperlipidemia   . Hypertension   . Myocardial infarction ([place]) 2000&2001  . Obesity   . Onychomycosis   . OSA (obstructive sleep apnea)   . S/P ICD (internal cardiac defibrillator) procedure and BiV device,  07/25/17 Medtronic  07/26/2017  . Seizures ([place])   . Sleep apnea   . Stroke ([place])   . Tubular adenoma of colon 10/2002    Past Surgical History:  Procedure Laterality Date  . ANGIOPLASTY  2001   stent x 1  . AV NODE ABLATION N/A 08/20/2017   Procedure: AV NODE ABLATION;  Surgeon: Deboraha Sprang, MD;  Location: [facility];  Service: Cardiovascular;  Laterality: N/A;  . BIV ICD INSERTION CRT-D N/A 07/25/2017   Procedure: BIV ICD INSERTION CRT-D;  Surgeon: Deboraha Sprang, MD;  Location: [facility];  Service: Cardiovascular;  Laterality: N/A;  . COLONOSCOPY  2000?   negative  . COLONOSCOPY WITH PROPOFOL N/A 02/08/2019   Procedure: COLONOSCOPY WITH PROPOFOL;  Surgeon: Ladene Artist, MD;  Location: [facility];  Service: Endoscopy;  Laterality: N/A;  . FOOT ARTHROTOMY Right   .  POLYPECTOMY  02/08/2019   Procedure: POLYPECTOMY;  Surgeon: Ladene Artist, MD;  Location: [facility];  Service: Endoscopy;;  . TOE AMPUTATION Left 2012    Current Outpatient Medications  Medication Sig Dispense Refill  . calcitRIOL (ROCALTROL) 0.5 MCG capsule Take 0.5 mcg by mouth daily at 12 noon.     . carvedilol (COREG) 25 MG tablet Take 1 tablet (25 mg total) by mouth 2 (two) times daily. 180 tablet 2  . Continuous Blood Gluc Receiver (FREESTYLE LIBRE 14 DAY READER) DEVI Apply topically as directed.    . Continuous Blood Gluc Sensor (FREESTYLE LIBRE 14 DAY SENSOR) MISC 1 Device by Does not apply route every 14 (fourteen) days. 2 each 11  . Dulaglutide (TRULICITY) 4.5 ZO/1.0RU SOPN Inject into the skin once a week.    Marland Kitchen ELIQUIS 5 MG TABS tablet TAKE 1 TABLET BY MOUTH TWICE A DAY  180 tablet 1  . furosemide (LASIX) 40 MG tablet Take 2 tablets (80 mg total) by mouth daily. Please keep upcoming appt in October with Dr. Caryl Comes before anymore refills. Thank you 180 tablet 0  . gabapentin (NEURONTIN) 300 MG capsule TAKE ONE CAPSULE BY MOUTH AT BEDTIME 30 capsule 5  . glucose blood (ONETOUCH VERIO) test strip Use as instructed 100 each 12  . hydrALAZINE (APRESOLINE) 50 MG tablet Take 50 mg by mouth 3 (three) times daily.    . insulin degludec (TRESIBA FLEXTOUCH) 200 UNIT/ML FlexTouch Pen Inject 22 Units into the skin daily.    . Insulin Pen Needle (PEN NEEDLES) 32G X 4 MM MISC 1 Device by Does not apply route as directed. 200 each 11  . Lancets (ONETOUCH ULTRASOFT) lancets Use as instructed 100 each 12  . loratadine (CLARITIN) 10 MG tablet Take 10 mg by mouth daily.    Marland Kitchen losartan (COZAAR) 25 MG tablet Take 1 tablet (25 mg total) by mouth daily. 90 tablet 3  . Multiple Vitamin (MULTIVITAMIN) tablet Take 1 tablet by mouth daily.      . nitroGLYCERIN (NITROSTAT) 0.4 MG SL tablet Place 1 tablet (0.4 mg total) under the tongue every 5 (five) minutes as needed for chest pain. If no relief by 3rd tab, call 911 25 tablet 3  . omeprazole (PRILOSEC) 40 MG capsule TAKE ONE CAPSULE BY MOUTH DAILY BEFORE SUPPER 90 capsule 3  . oxymorphone (OPANA) 5 MG tablet Take 5 mg by mouth 3 (three) times daily as needed for pain (max 3 tablets daily). Per Dr Brien Few    . PARoxetine (PAXIL) 40 MG tablet Take 1 tablet (40 mg total) by mouth every morning. 90 tablet 0  . potassium chloride SA (KLOR-CON M20) 20 MEQ tablet Take 1 tablet (20 mEq total) by mouth 2 (two) times daily. 180 tablet 2  . rosuvastatin (CRESTOR) 20 MG tablet Take 1 tablet (20 mg total) by mouth daily. 90 tablet 3   No current facility-administered medications for this visit.    Allergies  Allergen Reactions  . Enalapril Maleate Cough      Review of Systems negative except from HPI and PMH  Physical Exam BP 132/80   Pulse 87    Ht 6' (1.829 m)   Wt 233 lb (105.7 kg)   SpO2 98%   BMI 31.60 kg/m  Well developed and well nourished in no acute distress HENT normal Neck supple with JVP 6-8 Clear Device pocket well healed; without hematoma or erythema.  There is no tethering  Regular rate and rhythm, no  gallop No murmur  Abd-soft with active BS No Clubbing cyanosis  edema Skin-warm and dry A & Oriented  Grossly normal sensory and motor function  ECG atrial fibrillation with underlying ventricular pacing with a negative QRS lead I and rS V1 rare PVC  Assessment and  Plan  Atrial fibrillation-permanent rapid ventricular response  AV Node ablation   LV threshold elevated   Low-voltage limb leads and severe concentric LVH concerning for amyloid   Congestive heart failure-chronic-systolic-class IIb    Hypertension  Renal insufficiency grade 4  Presyncope  Nonischemic cardiomyopathy-new  Implantable defibrillator   Euvolemic continue current meds  Dyspnea could be multifactorial suspect related to HFpEF.  Given the recent MR trial, will ask Dr. Lesly Dukes as to whether she would like to initiate an SGLT2 inhibitor  No intercurrent Ventricular tachycardia  On Anticoagulation;  No bleeding issues

## 2020-03-21 NOTE — Patient Instructions (Signed)
Medication Instructions:  Your physician recommends that you continue on your current medications as directed. Please refer to the Current Medication list given to you today.  *If you need a refill on your cardiac medications before your next appointment, please call your pharmacy*   Lab Work: CBC and BMET today If you have labs (blood work) drawn today and your tests are completely normal, you will receive your results only by: . MyChart Message (if you have MyChart) OR . A paper copy in the mail If you have any lab test that is abnormal or we need to change your treatment, we will call you to review the results.   Testing/Procedures: None ordered.    Follow-Up: At [facility], you and your health needs are our priority.  As part of our continuing mission to provide you with exceptional heart care, we have created designated Provider Care Teams.  These Care Teams include your primary Cardiologist (physician) and Advanced Practice Providers (APPs -  Physician Assistants and Nurse Practitioners) who all work together to provide you with the care you need, when you need it.  We recommend signing up for the patient portal called "MyChart".  Sign up information is provided on this After Visit Summary.  MyChart is used to connect with patients for Virtual Visits (Telemedicine).  Patients are able to view lab/test results, encounter notes, upcoming appointments, etc.  Non-urgent messages can be sent to your provider as well.   To learn more about what you can do with MyChart, go to [email or web address].    Your next appointment:   12 month(s)  The format for your next appointment:   In Person  Provider:   Steven Klein, MD     

## 2020-03-22 LAB — CBC
Hematocrit: 42.6 % (ref 37.5–51.0)
Hemoglobin: 14.3 g/dL (ref 13.0–17.7)
MCH: 29.3 pg (ref 26.6–33.0)
MCHC: 33.6 g/dL (ref 31.5–35.7)
MCV: 87 fL (ref 79–97)
Platelets: 142 10*3/uL — ABNORMAL LOW (ref 150–450)
RBC: 4.88 x10E6/uL (ref 4.14–5.80)
RDW: 13.6 % (ref 11.6–15.4)
WBC: 7.2 10*3/uL (ref 3.4–10.8)

## 2020-03-22 LAB — BASIC METABOLIC PANEL
BUN/Creatinine Ratio: 11 (ref 10–24)
BUN: 26 mg/dL (ref 8–27)
CO2: 25 mmol/L (ref 20–29)
Calcium: 9.3 mg/dL (ref 8.6–10.2)
Chloride: 101 mmol/L (ref 96–106)
Creatinine, Ser: 2.47 mg/dL — ABNORMAL HIGH (ref 0.76–1.27)
GFR calc Af Amer: 30 mL/min/{1.73_m2} — ABNORMAL LOW (ref >59)
GFR calc non Af Amer: 26 mL/min/{1.73_m2} — ABNORMAL LOW (ref >59)
Glucose: 153 mg/dL — ABNORMAL HIGH (ref 65–99)
Potassium: 4.1 mmol/L (ref 3.5–5.2)
Sodium: 139 mmol/L (ref 134–144)

## 2020-03-31 ENCOUNTER — Telehealth: Payer: Self-pay | Admitting: Cardiology

## 2020-03-31 NOTE — Telephone Encounter (Signed)
Spoke with pt and advised RN has contacted Dr Meda Coffee with message re: Medication recommendation as Dr Caryl Comes is out of the office x 2 weeks.  Pt verbalizes understanding and agrees with current plan.

## 2020-03-31 NOTE — Telephone Encounter (Signed)
Pt said Dr. Meda Coffee and Dr. Caryl Comes were going to discuss amongst themselves a medication that the patient was going to try. The patient has not heard anything from either Dr. Meda Coffee or Dr. Caryl Comes yet. He is not sure what to do next.

## 2020-03-31 NOTE — Telephone Encounter (Signed)
Attempted phone call to pt.  Left voicemail message to contact RN at [phone]. 

## 2020-04-03 ENCOUNTER — Telehealth: Payer: Self-pay | Admitting: *Deleted

## 2020-04-03 MED ORDER — EMPAGLIFLOZIN 10 MG PO TABS: 10.0000 mg | ORAL_TABLET | Freq: Every day | ORAL | 1 refills | Status: DC

## 2020-04-03 NOTE — Telephone Encounter (Signed)
Pt called me back to inform me that he called his insurance company and informed them that we were starting him on Jardiance 10 mg po daily.  Pt states per his insurance company, this medication will be covered for him at no cost for right now.  This could change when re-enrollment occurs.  Pt states he will see how much validity there is to his insurance covering this medication, but he will keep [place] in touch if there is the need for our office to assist him with patient assistance for jardiance.  Pt was very appreciative for all the assistance and states he will go and pick this medication up today, and start taking this tomorrow.  Informed the pt to keep [place] updated as needed and I will pass the good news onto Dr. Meda Coffee, our prior [place], and our Pharmacist.  Pt verbalized understanding and agrees with this plan.

## 2020-04-03 NOTE — Telephone Encounter (Signed)
Nuala Alpha, LPN  medication management Reason for call  Conversation: medication management (Newest Message First) Me to Via, Deliah Boston, LPN . Cv Div Pharmd . Maccia, Melissa D, RPH-CPP . Supple, Megan E, RPH-CPP . Dorothy Spark, MD     04/03/20 1:40 PM Pt interested with 90 day supply of this med, but he states he will need help with patient assistance on lowering the cost.  Can you help with this?  Thanks so much!  Me     04/03/20 1:38 PM Note Spoke with the pt and he is interested in starting Jardiance 10 mg po daily, but will need to see if our Prior Auth Nurse/Pharmacist could help him with patient assistance, for cost will be an issue.  Pt states he would like a 90 day supply sent to his confirmed pharmacy of choice.  Informed the pt that I will send a message to our prior [place], to assist him in getting pt assistance for this medication, or help in lowering the cost of this medicine.  Will also route this message to our Pharmacist, to inquire their assistance with lowering the cost of Jardiance for him.  Pt is aware that our office will be in contact with him about this.  Pt verbalized understanding and agrees with this plan.  Pt was more than gracious for all the assistance provided.     Me     04/03/20 1:31 PM Note ----- Message from Dorothy Spark, MD sent at 04/03/2020 12:29 PM EST ----- Regarding: RE: SGLT2 inhibitor Thank you, Karlene Einstein would ou ask him if this is acceptable for him? Thank you, KN ----- Message ----- From: Ramond Dial, RPH-CPP Sent: 04/03/2020  12:22 PM EST To: Dorothy Spark, MD, Nuala Alpha, LPN, # Subject: RE: SGLT2 inhibitor                            Patient has medicare so he does not qualify for coupons. Vania Rea is a tier 3- cost would be $45/month or 90/90 days (cheaper for a 90 days supply). We could try patient assistance if this cost is too high. Other SGLT2 is non-formulary.  Melissa ----- Message  ----- From: Dorothy Spark, MD Sent: 04/01/2020  11:39 AM EST To: Nuala Alpha, LPN, Leeroy Bock, RPH-CPP, # Subject: RE: SGLT2 inhibitor                            Karlene Einstein, Would you start him on Jardiance 10 mg po daily? Megan, do we have any copay discount cards? Are you able to see how much it will cost him? Is any other SGLT2 antagonist cheaper?Thank you, KN ----- Message ----- From: Thora Lance, RN Sent: 03/31/2020   3:43 PM EDT To: Dorothy Spark, MD Subject: SGLT2 inhibitor                                Dr Meda Coffee,  Will you please take a look at Dr Olin Pia last note on this pt?  He was going to reach out to you.  See his comment below.  Pt is calling asking about recommendation.  Dr Caryl Comes is out of town for the next 2 weeks.  Thank you for your help.  Dyspnea could be multifactorial suspect related to HFpEF.  Given the  recent MR trial, will ask Dr. Lesly Dukes as to whether she would like to initiate an SGLT2 inhibitor.  Rosann Auerbach R,RN

## 2020-04-03 NOTE — Telephone Encounter (Signed)
Nuala Alpha, LPN at 28/08/1322 4:01 PM  Status: Signed    Pt called me back to inform me that he called his insurance company and informed them that we were starting him on Jardiance 10 mg po daily.  Pt states per his insurance company, this medication will be covered for him at no cost for right now.  This could change when re-enrollment occurs.  Pt states he will see how much validity there is to his insurance covering this medication, but he will keep [place] in touch if there is the need for our office to assist him with patient assistance for jardiance.  Pt was very appreciative for all the assistance and states he will go and pick this medication up today, and start taking this tomorrow.  Informed the pt to keep [place] updated as needed and I will pass the good news onto Dr. Meda Coffee, our prior [place], and our Pharmacist.  Pt verbalized understanding and agrees with this plan.

## 2020-04-03 NOTE — Telephone Encounter (Signed)
This is correct. I looked at the wrong formulary the first time. It is a tier 6, which means it is zero cost during the initial phase.

## 2020-04-03 NOTE — Telephone Encounter (Signed)
Spoke with the pt and he is interested in starting Jardiance 10 mg po daily, but will need to see if our Prior Auth Nurse/Pharmacist could help him with patient assistance, for cost will be an issue.  Pt states he would like a 90 day supply sent to his confirmed pharmacy of choice.  Informed the pt that I will send a message to our prior [place], to assist him in getting pt assistance for this medication, or help in lowering the cost of this medicine.  Will also route this message to our Pharmacist, to inquire their assistance with lowering the cost of Jardiance for him.  Pt is aware that our office will be in contact with him about this.  Pt verbalized understanding and agrees with this plan.  Pt was more than gracious for all the assistance provided.

## 2020-04-03 NOTE — Telephone Encounter (Signed)
-----   Message from Dorothy Spark, MD sent at 04/03/2020 12:29 PM EST ----- Regarding: RE: SGLT2 inhibitor Thank you, Karlene Einstein would ou ask him if this is acceptable for him? Thank you, KN ----- Message ----- From: Ramond Dial, RPH-CPP Sent: 04/03/2020  12:22 PM EST To: Dorothy Spark, MD, Nuala Alpha, LPN, # Subject: RE: SGLT2 inhibitor                            Patient has medicare so he does not qualify for coupons. Vania Rea is a tier 3- cost would be $45/month or 90/90 days (cheaper for a 90 days supply). We could try patient assistance if this cost is too high. Other SGLT2 is non-formulary.  Melissa ----- Message ----- From: Dorothy Spark, MD Sent: 04/01/2020  11:39 AM EST To: Nuala Alpha, LPN, Leeroy Bock, RPH-CPP, # Subject: RE: SGLT2 inhibitor                            Karlene Einstein, Would you start him on Jardiance 10 mg po daily? Megan, do we have any copay discount cards? Are you able to see how much it will cost him? Is any other SGLT2 antagonist cheaper?Thank you, KN ----- Message ----- From: Thora Lance, RN Sent: 03/31/2020   3:43 PM EDT To: Dorothy Spark, MD Subject: SGLT2 inhibitor                                Dr Meda Coffee,  Will you please take a look at Dr Olin Pia last note on this pt?  He was going to reach out to you.  See his comment below.  Pt is calling asking about recommendation.  Dr Caryl Comes is out of town for the next 2 weeks.  Thank you for your help.  Dyspnea could be multifactorial suspect related to HFpEF.  Given the recent MR trial, will ask Dr. Lesly Dukes as to whether she would like to initiate an SGLT2 inhibitor.  Rosann Auerbach R,RN

## 2020-04-05 DIAGNOSIS — E1165 Type 2 diabetes mellitus with hyperglycemia: Secondary | ICD-10-CM | POA: Diagnosis not present

## 2020-04-05 NOTE — Telephone Encounter (Signed)
Pt called to inform Anthony [name] that his insurance is still causing a hold up with the medication. He states that one person said ins would cover it bu twent he went to the pharmacy they said it is still showing $300 + . Patient states that he called the insurance company back in order to find out what was going on and they said they would have to call him back with an update. That was on 04/03/2020. Patient would like to know if Anthony [name] has any advice on next steps - if there are discounts or something if insurance does not cover. Please call/advise. Thank you!

## 2020-04-05 NOTE — Telephone Encounter (Signed)
Will route this message to our Prior [place] and our Pharmacist, to further assist the pt with getting the cost reduced on newly prescribed Jardiance.  Pts insurance initially informed him that this would be covered at no cost when this was prescribed on 11/8, but now they are informing him this will cost him $300.  Pt aware that he will receive a call back from our prior auth nurse, once the status of his prior Anthony [name] is complete and determined.  Pt verbalized understanding and agrees with this plan.

## 2020-04-06 NOTE — Telephone Encounter (Signed)
Still waiting for pt to call back. Will place samples at front desk

## 2020-04-06 NOTE — Telephone Encounter (Addendum)
Called [facility]- the reason the cost is >$300 for a 90 DS and not $0 is because patient is in the coverage gap. Will be $0 once the new year comes. We can sample the patient through the end of the year.  -tried to call pt to let him know we have samples for him through the end of the year. Left VM for pt to call back

## 2020-04-06 NOTE — Telephone Encounter (Signed)
Spoke with the pt and he is aware of being in the doughnut hole and that the cost of his Anthony [name] will be $0 at the beginning of the year.  Informed the pt that our Pharmacist Marcelle Overlie, left him samples of Jardiance at the front desk for him to pick up, and this will get him through until the new year.  Pt verbalized understanding and agrees with this plan.  Pt was more than gracious for all the assistance provided.  Pt states he will pick his samples up from the office tomorrow.

## 2020-04-09 ENCOUNTER — Other Ambulatory Visit: Payer: Self-pay | Admitting: Family Medicine

## 2020-04-10 NOTE — Telephone Encounter (Signed)
Will speak to patient about discontinuing this medication at his next follow up visit (due for office visit in January)

## 2020-04-12 ENCOUNTER — Other Ambulatory Visit: Payer: Self-pay

## 2020-04-12 NOTE — Patient Outreach (Signed)
  [facility]) Care Management Chronic Special Needs Program    04/12/2020  Name: Anthony [name], Anthony [name]: 1951/04/24  MRN: [id number]   [employer] Care Management Team has assumed care and services for this member. Case closed by [place] Management.  Thea Silversmith, RN, MSN, [place] [phone]

## 2020-04-17 DIAGNOSIS — G4733 Obstructive sleep apnea (adult) (pediatric): Secondary | ICD-10-CM | POA: Diagnosis not present

## 2020-04-26 ENCOUNTER — Ambulatory Visit (INDEPENDENT_AMBULATORY_CARE_PROVIDER_SITE_OTHER): Payer: HMO

## 2020-04-26 ENCOUNTER — Other Ambulatory Visit: Payer: Self-pay

## 2020-04-26 ENCOUNTER — Ambulatory Visit (INDEPENDENT_AMBULATORY_CARE_PROVIDER_SITE_OTHER): Payer: HMO | Admitting: Podiatry

## 2020-04-26 ENCOUNTER — Encounter: Payer: Self-pay | Admitting: Podiatry

## 2020-04-26 DIAGNOSIS — Z23 Encounter for immunization: Secondary | ICD-10-CM | POA: Diagnosis not present

## 2020-04-26 DIAGNOSIS — M79609 Pain in unspecified limb: Secondary | ICD-10-CM | POA: Diagnosis not present

## 2020-04-26 DIAGNOSIS — E119 Type 2 diabetes mellitus without complications: Secondary | ICD-10-CM

## 2020-04-26 DIAGNOSIS — B351 Tinea unguium: Secondary | ICD-10-CM

## 2020-04-26 DIAGNOSIS — Q828 Other specified congenital malformations of skin: Secondary | ICD-10-CM

## 2020-04-26 DIAGNOSIS — D689 Coagulation defect, unspecified: Secondary | ICD-10-CM

## 2020-04-26 NOTE — Progress Notes (Signed)
   Covid-19 Vaccination Clinic  Name:  Anthony [name]    MRN: [id number] DOB: June 18, 1950  04/26/2020  Anthony [name] was observed post Covid-19 immunization for 15 minutes without incident. He was provided with Vaccine Information Sheet and instruction to access the V-Safe system.   Anthony [name] was instructed to call 911 with any severe reactions post vaccine: Marland Kitchen Difficulty breathing  . Swelling of face and throat  . A fast heartbeat  . A bad rash all over body  . Dizziness and weakness   Booster administered RD without complication.

## 2020-04-26 NOTE — Progress Notes (Signed)
This patient returns to my office for at risk foot care.  This patient requires this care by a professional since this patient will be at risk due to having venous insufficiency, chronic kidney disease stage IV diabetes and coagulation defect.  Patient is taking eliquiss.  This patient is unable to cut nails himself since the patient cannot reach his nails.These nails are painful walking and wearing shoes.  This patient presents for at risk foot care today.  General Appearance  Alert, conversant and in no acute stress.  Vascular  Dorsalis pedis and posterior tibial  pulses are palpable  bilaterally.  Capillary return is within normal limits  bilaterally. Temperature is within normal limits  bilaterally.  Neurologic  Senn-Weinstein monofilament wire test within normal limits  bilaterally. Muscle power within normal limits bilaterally.  Nails Thick disfigured discolored nails with subungual debris  from hallux to fifth toes bilaterally. No evidence of bacterial infection or drainage bilaterally.  Orthopedic  No limitations of motion  feet .  No crepitus or effusions noted.  No bony pathology or digital deformities noted.  Amputation second toe left foot.  Skin  normotropic skin with no porokeratosis noted bilaterally.  No signs of infections or ulcers noted.  Porokeratosis sub 4 left and sub 5th right   Onychomycosis  Pain in right toes  Pain in left toes  Consent was obtained for treatment procedures.   Mechanical debridement of nails 1-5  Right and 1,3-5 left foot. performed with a nail nipper.  Filed with dremel without incident. Debridement of the porokeratosis  B/L with # 15 blade.   Return office visit   10 weeks                   Told patient to return for periodic foot care and evaluation due to potential at risk complications.   Gardiner Barefoot DPM

## 2020-05-04 ENCOUNTER — Ambulatory Visit: Payer: Self-pay

## 2020-05-05 DIAGNOSIS — E1165 Type 2 diabetes mellitus with hyperglycemia: Secondary | ICD-10-CM | POA: Diagnosis not present

## 2020-05-09 ENCOUNTER — Ambulatory Visit (INDEPENDENT_AMBULATORY_CARE_PROVIDER_SITE_OTHER): Payer: HMO

## 2020-05-09 DIAGNOSIS — I5022 Chronic systolic (congestive) heart failure: Secondary | ICD-10-CM | POA: Diagnosis not present

## 2020-05-09 LAB — CUP PACEART REMOTE DEVICE CHECK
Battery Remaining Longevity: 19 mo
Battery Voltage: 2.92 V
Brady Statistic AP VP Percent: 0 %
Brady Statistic AP VS Percent: 0 %
Brady Statistic AS VP Percent: 0 %
Brady Statistic AS VS Percent: 0 %
Brady Statistic RA Percent Paced: 0 %
Brady Statistic RV Percent Paced: 99.35 %
Date Time Interrogation Session: 20211214044223
HighPow Impedance: 76 Ohm
Implantable Lead Implant Date: 20190301
Implantable Lead Implant Date: 20190301
Implantable Lead Location: 753858
Implantable Lead Location: 753860
Implantable Pulse Generator Implant Date: 20190301
Lead Channel Impedance Value: 1178 Ohm
Lead Channel Impedance Value: 1178 Ohm
Lead Channel Impedance Value: 1178 Ohm
Lead Channel Impedance Value: 1178 Ohm
Lead Channel Impedance Value: 1197 Ohm
Lead Channel Impedance Value: 1197 Ohm
Lead Channel Impedance Value: 317.612
Lead Channel Impedance Value: 326.029
Lead Channel Impedance Value: 326.029
Lead Channel Impedance Value: 341.736
Lead Channel Impedance Value: 341.736
Lead Channel Impedance Value: 4047 Ohm
Lead Channel Impedance Value: 418 Ohm
Lead Channel Impedance Value: 513 Ohm
Lead Channel Impedance Value: 608 Ohm
Lead Channel Impedance Value: 665 Ohm
Lead Channel Impedance Value: 703 Ohm
Lead Channel Impedance Value: 703 Ohm
Lead Channel Pacing Threshold Amplitude: 0.5 V
Lead Channel Pacing Threshold Amplitude: 3.5 V
Lead Channel Pacing Threshold Pulse Width: 0.4 ms
Lead Channel Pacing Threshold Pulse Width: 1.5 ms
Lead Channel Sensing Intrinsic Amplitude: 15 mV
Lead Channel Sensing Intrinsic Amplitude: 15 mV
Lead Channel Setting Pacing Amplitude: 2.5 V
Lead Channel Setting Pacing Amplitude: 3.5 V
Lead Channel Setting Pacing Pulse Width: 0.4 ms
Lead Channel Setting Pacing Pulse Width: 1.5 ms
Lead Channel Setting Sensing Sensitivity: 0.3 mV

## 2020-05-15 ENCOUNTER — Other Ambulatory Visit: Payer: Self-pay

## 2020-05-15 NOTE — Patient Outreach (Signed)
  [facility]) Care Management Chronic Special Needs Program    05/15/2020  Name: Anthony [name], Anthony [name]: 02-04-1951  MRN: [id number]   RNCM received voice message from client requesting a call back (did not leave specific message). [employer] Care Management Team has assumed care. RNCM send message to [facility] Advantage care management team with request to contact client.  Thea Silversmith, RN, MSN, [place] [phone]

## 2020-05-16 ENCOUNTER — Telehealth: Payer: Self-pay | Admitting: Cardiology

## 2020-05-16 NOTE — Telephone Encounter (Signed)
Per phone note 04/03/2020, patient was provided with enough samples to last him through until the new year, but he is now saying that he is on a fixed income and needs help with this medication.

## 2020-05-16 NOTE — Telephone Encounter (Signed)
    Patient calling the office for samples of medication:   1.  What medication and dosage are you requesting samples for?empagliflozin (JARDIANCE) 10 MG TABS tablet  2.  Are you currently out of this medication?   Pt said his in fixed income and need help with this medication

## 2020-05-17 NOTE — Telephone Encounter (Signed)
Per patients insurance his copay for next year will be $0. He can choose to apply for patient assistance now or when he hits the coverage gap later on in the year.  Called to discuss with patient. Left VM on machine.

## 2020-05-22 ENCOUNTER — Encounter: Payer: Self-pay | Admitting: Family Medicine

## 2020-05-22 ENCOUNTER — Ambulatory Visit (INDEPENDENT_AMBULATORY_CARE_PROVIDER_SITE_OTHER): Payer: HMO | Admitting: Family Medicine

## 2020-05-22 ENCOUNTER — Other Ambulatory Visit: Payer: Self-pay

## 2020-05-22 VITALS — BP 138/92 | HR 86 | Ht 72.0 in | Wt 232.6 lb

## 2020-05-22 DIAGNOSIS — Z79899 Other long term (current) drug therapy: Secondary | ICD-10-CM | POA: Diagnosis not present

## 2020-05-22 DIAGNOSIS — Z794 Long term (current) use of insulin: Secondary | ICD-10-CM

## 2020-05-22 DIAGNOSIS — E119 Type 2 diabetes mellitus without complications: Secondary | ICD-10-CM

## 2020-05-22 DIAGNOSIS — Z7901 Long term (current) use of anticoagulants: Secondary | ICD-10-CM | POA: Diagnosis not present

## 2020-05-22 LAB — POCT GLYCOSYLATED HEMOGLOBIN (HGB A1C): Hemoglobin A1C: 8.9 % — AB (ref 4.0–5.6)

## 2020-05-22 MED ORDER — APIXABAN 5 MG PO TABS: 5.0000 mg | ORAL_TABLET | Freq: Two times a day (BID) | ORAL | 3 refills | Status: DC

## 2020-05-22 MED ORDER — EMPAGLIFLOZIN 10 MG PO TABS: 10.0000 mg | ORAL_TABLET | Freq: Every day | ORAL | 0 refills | Status: DC

## 2020-05-22 NOTE — Patient Instructions (Signed)
We gave you a month of Eliquis samples today. Please restart this medication today.   Please come back next week, 1/4 at 1:50pm and bring all of your medications with you.

## 2020-05-22 NOTE — Assessment & Plan Note (Signed)
No changes in medications made today.  A1c is elevated at 8.9.  Patient has been out of Trulicity for 2 weeks --patient does not know where he gets Trulicity from.  I have notes that show that we sent a fax previously for 1.5 mg weekly which was to replace his Ozempic 1 mg weekly.  Not sure where he has been getting his Trulicity from.   Weight has been stable.Unable to interrogate freestyle libre today.  Given other acute issues today (being out of Eliquis), will revisit diabetes next week with interrogation of freestyle libre and make adjustments as needed.

## 2020-05-22 NOTE — Assessment & Plan Note (Addendum)
Reviewed medication list with patient today.  He does not have his medications with him today.  He does have multiple medication assistance programs that he uses for different medications.  Per chart review, appears that he has been a part of the [facility] program in the past as well.  Patient signed forms for medication assistance today.  Staff message sent to [employer]. Have scheduled an appointment for patient to come back in 1 week with all of his medications to more thoroughly review his medications.  We will also do further chart review on medications from cardiology.  Would strongly consider a very long taper for Paxil given his age.

## 2020-05-22 NOTE — Progress Notes (Signed)
   SUBJECTIVE:  CHIEF COMPLAINT / HPI:   1. Insulin dependent type 2 diabetes mellitus (HCC) Tresiba 42 units (H675?)  Trulicity - out of this for two weeks  Jardiance - once daily  Uses CGM: Freestyle Libre 14 days (unable to interrogate device today. Dr. Valentina Lucks out of office).  Denies fatigue, nausea/vmoiting, appetite changes, paraesthesias, polyuria/polydipsia, visual changes. Denies hypoglycemia with tremulousness, diaphoresis, tachycardia. Reports diabetic foot checks at home.  Wt Readings from Last 3 Encounters:  05/22/20 232 lb 9.6 oz (105.5 kg)  03/21/20 233 lb (105.7 kg)  03/17/20 235 lb (106.6 kg)   2. Polypharmacy /out of medications Reviewed medication list with patient today. He reports that he is out of eliquis and trulicity. Unsure which medication assisatnace programs he uses for each.   PERTINENT  PMH / PSH: CKD IV, A fib on eliquis, hx of CVA, CHF  OBJECTIVE:  BP (!) 138/92   Pulse 86   Ht 6' (1.829 m)   Wt 232 lb 9.6 oz (105.5 kg)   SpO2 99%   BMI 31.55 kg/m   General: well appearing male, NAD   ASSESSMENT/PLAN:  Polypharmacy Reviewed medication list with patient today.  He does not have his medications with him today.  He does have multiple medication assistance programs that he uses for different medications.  Per chart review, appears that he has been a part of the [place] [facility] program in the past as well.  Patient signed forms for medication assistance today.  Staff message sent to [employer]. Have scheduled an appointment for patient to come back in 1 week with all of his medications to more thoroughly review his medications.  We will also do further chart review on medications from cardiology.  Would strongly consider a very long taper for Paxil given his age.  Insulin dependent type 2 diabetes mellitus (HCC) No changes in medications made today.  A1c is elevated at 8.9.  Patient has been out of Trulicity for 2 weeks --patient does not know where he  gets Trulicity from.  I have notes that show that we sent a fax previously for 1.5 mg weekly which was to replace his Ozempic 1 mg weekly.  Not sure where he has been getting his Trulicity from.   Weight has been stable.Unable to interrogate freestyle libre today.  Given other acute issues today (being out of Eliquis), will revisit diabetes next week with interrogation of freestyle libre and make adjustments as needed.   Chronic anticoagulation Patient reports that he has been out of Eliquis since Saturday.  Provided 4 weeks of samples to patient today.  Medication assistance forms have been started.    [name], MD [place]   Visit was video precepted with Dr. McDiarmid today

## 2020-05-22 NOTE — Telephone Encounter (Signed)
Spoke to patient. States he needs one more bottle to get to the new year. Will leave him one more bottle of Jardiance 10mg  at the front desk

## 2020-05-22 NOTE — Assessment & Plan Note (Signed)
Patient reports that he has been out of Eliquis since Saturday.  Provided 4 weeks of samples to patient today.  Medication assistance forms have been started.

## 2020-05-23 NOTE — Progress Notes (Signed)
Remote ICD transmission.   

## 2020-05-24 ENCOUNTER — Telehealth: Payer: Self-pay | Admitting: Pharmacist

## 2020-05-24 NOTE — Telephone Encounter (Signed)
Return of phone call received 12/21 RE Eliquis sample   Patient shared that he has received a supply from his PCP, Dr. Zettie Cooley, at his recent visit on Monday.  He reports having ~ 1 month supply at this time.   He requested support for long-term supply of this medication through manufacturer.  I will route to Cheryle Horsfall for additional support.

## 2020-05-25 DIAGNOSIS — E119 Type 2 diabetes mellitus without complications: Secondary | ICD-10-CM | POA: Diagnosis not present

## 2020-05-30 ENCOUNTER — Ambulatory Visit (INDEPENDENT_AMBULATORY_CARE_PROVIDER_SITE_OTHER): Payer: HMO | Admitting: Family Medicine

## 2020-05-30 ENCOUNTER — Other Ambulatory Visit: Payer: Self-pay

## 2020-05-30 VITALS — BP 110/60 | HR 80 | Ht 72.0 in | Wt 233.4 lb

## 2020-05-30 DIAGNOSIS — N529 Male erectile dysfunction, unspecified: Secondary | ICD-10-CM

## 2020-05-30 DIAGNOSIS — F32A Depression, unspecified: Secondary | ICD-10-CM | POA: Diagnosis not present

## 2020-05-30 DIAGNOSIS — E119 Type 2 diabetes mellitus without complications: Secondary | ICD-10-CM

## 2020-05-30 DIAGNOSIS — Z79899 Other long term (current) drug therapy: Secondary | ICD-10-CM

## 2020-05-30 DIAGNOSIS — Z794 Long term (current) use of insulin: Secondary | ICD-10-CM

## 2020-05-30 MED ORDER — TADALAFIL 5 MG PO TABS: 5.0000 mg | ORAL_TABLET | Freq: Every day | ORAL | 0 refills | Status: DC | PRN

## 2020-05-30 MED ORDER — PAROXETINE HCL 30 MG PO TABS: 30.0000 mg | ORAL_TABLET | Freq: Every day | ORAL | 0 refills | Status: DC

## 2020-05-30 MED ORDER — GABAPENTIN 300 MG PO CAPS: 300.0000 mg | ORAL_CAPSULE | Freq: Every day | ORAL | 3 refills | Status: DC

## 2020-05-30 NOTE — Patient Instructions (Addendum)
Erectile Dysfunction  Please come back at your earliest convenience for a morning lab draw. I have sent a prescription for Cialis to the pharmacy.  Take one 5 mg tablet this 1 hour prior to sexual intercourse. You can increase to 10 mg (two tablets) if needed.  Do not take Cialis if you are experiencing chest pain or with nitroglycerin   Decreasing medications  1. Stop taking Claritin  2. Start to decrease the Paxil. I have sent a prescription for 30 mg tablets to the pharmacy. We will do 30 mg for one month, then decrease to 20 mg for one month, then 10 mg for one month. We will continue to follow you closely as you come off of this medicaiton. If you notice any change in your mood (increase depression, anxiety, suicidal thoughts), please call us immediately.  Follow up in 3 weeks to check in.      Tadalafil tablets (Cialis) What is this medicine? TADALAFIL (tah DA la fil) is used to treat erection problems in men. It is also used for enlargement of the prostate gland in men, a condition called benign prostatic hyperplasia or BPH. This medicine improves urine flow and reduces BPH symptoms. This medicine can also treat both erection problems and BPH when they occur together. This medicine may be used for other purposes; ask your health care provider or pharmacist if you have questions. COMMON BRAND NAME(S): Kathaleen Bury, Cialis What should I tell my health care provider before I take this medicine? They need to know if you have any of these conditions:  bleeding disorders  eye or vision problems, including a rare inherited eye disease called retinitis pigmentosa  anatomical deformation of the penis, Peyronie's disease, or history of priapism (painful and prolonged erection)  heart disease, angina, a history of heart attack, irregular heart beats, or other heart problems  high or low blood pressure  history of blood diseases, like sickle cell anemia or leukemia  history of stomach  bleeding  kidney disease  liver disease  stroke  an unusual or allergic reaction to tadalafil, other medicines, foods, dyes, or preservatives  pregnant or trying to get pregnant  breast-feeding How should I use this medicine? Take this medicine by mouth with a glass of water. Follow the directions on the prescription label. You may take this medicine with or without meals. When this medicine is used for erection problems, your doctor may prescribe it to be taken once daily or as needed. If you are taking the medicine as needed, you may be able to have sexual activity 30 minutes after taking it and for up to 36 hours after taking it. Whether you are taking the medicine as needed or once daily, you should not take more than one dose per day. If you are taking this medicine for symptoms of benign prostatic hyperplasia (BPH) or to treat both BPH and an erection problem, take the dose once daily at about the same time each day. Do not take your medicine more often than directed. Talk to your pediatrician regarding the use of this medicine in children. Special care may be needed. Overdosage: If you think you have taken too much of this medicine contact a poison control center or emergency room at once. NOTE: This medicine is only for you. Do not share this medicine with others. What if I miss a dose? If you are taking this medicine as needed for erection problems, this does not apply. If you miss a dose while taking  this medicine once daily for an erection problem, benign prostatic hyperplasia, or both, take it as soon as you remember, but do not take more than one dose per day. What may interact with this medicine? Do not take this medicine with any of the following medications:  nitrates like amyl nitrite, isosorbide dinitrate, isosorbide mononitrate, nitroglycerin  other medicines for erectile dysfunction like avanafil, sildenafil, vardenafil  other tadalafil products  (Adcirca)  riociguat This medicine may also interact with the following medications:  certain drugs for high blood pressure  certain drugs for the treatment of HIV infection or AIDS  certain drugs used for fungal or yeast infections, like fluconazole, itraconazole, ketoconazole, and voriconazole  certain drugs used for seizures like carbamazepine, phenytoin, and phenobarbital  grapefruit juice  macrolide antibiotics like clarithromycin, erythromycin, troleandomycin  medicines for prostate problems  rifabutin, rifampin or rifapentine This list may not describe all possible interactions. Give your health care provider a list of all the medicines, herbs, non-prescription drugs, or dietary supplements you use. Also tell them if you smoke, drink alcohol, or use illegal drugs. Some items may interact with your medicine. What should I watch for while using this medicine? If you notice any changes in your vision while taking this drug, call your doctor or health care professional as soon as possible. Stop using this medicine and call your health care provider right away if you have a loss of sight in one or both eyes. Contact your doctor or health care professional right away if the erection lasts longer than 4 hours or if it becomes painful. This may be a sign of serious problem and must be treated right away to prevent permanent damage. If you experience symptoms of nausea, dizziness, chest pain or arm pain upon initiation of sexual activity after taking this medicine, you should refrain from further activity and call your doctor or health care professional as soon as possible. Do not drink alcohol to excess (examples, 5 glasses of wine or 5 shots of whiskey) when taking this medicine. When taken in excess, alcohol can increase your chances of getting a headache or getting dizzy, increasing your heart rate or lowering your blood pressure. Using this medicine does not protect you or your partner  against HIV infection (the virus that causes AIDS) or other sexually transmitted diseases. What side effects may I notice from receiving this medicine? Side effects that you should report to your doctor or health care professional as soon as possible:  allergic reactions like skin rash, itching or hives, swelling of the face, lips, or tongue  breathing problems  changes in hearing  changes in vision  chest pain  fast, irregular heartbeat  prolonged or painful erection  seizures Side effects that usually do not require medical attention (report to your doctor or health care professional if they continue or are bothersome):  back pain  dizziness  flushing  headache  indigestion  muscle aches  nausea  stuffy or runny nose This list may not describe all possible side effects. Call your doctor for medical advice about side effects. You may report side effects to FDA at 1-800-FDA-1088. Where should I keep my medicine? Keep out of the reach of children. Store at room temperature between 15 and 30 degrees C (59 and 86 degrees F). Throw away any unused medicine after the expiration date. NOTE: This sheet is a summary. It may not cover all possible information. If you have questions about this medicine, talk to your doctor, pharmacist, or  health care provider.  2020 Elsevier/Gold Standard (2013-10-01 13:15:49)

## 2020-05-31 ENCOUNTER — Encounter: Payer: Self-pay | Admitting: Family Medicine

## 2020-05-31 ENCOUNTER — Other Ambulatory Visit: Payer: Self-pay

## 2020-05-31 ENCOUNTER — Other Ambulatory Visit: Payer: HMO

## 2020-05-31 DIAGNOSIS — N184 Chronic kidney disease, stage 4 (severe): Secondary | ICD-10-CM

## 2020-05-31 DIAGNOSIS — N529 Male erectile dysfunction, unspecified: Secondary | ICD-10-CM

## 2020-05-31 NOTE — Assessment & Plan Note (Signed)
After careful review of medications, can discontinue claritin and monitor for symptoms. See depression for paxil taper

## 2020-05-31 NOTE — Assessment & Plan Note (Signed)
Will start tapering Paxil. His PHQ 9 is 0 today and has been stable. He has been on Paxil for several years. Given notorious difficulty weaning paxil, will start very slow taper over months. Sent #30 30mg  paxil to pharmacy. Return precautions provided. Follow up in 3 weeks before next dose change.  Additionally, spoke with Dr. Valentina Lucks for further recommendations. If patient develops symptoms, can consider starting prozac 10 mg to help with withdrawal symptoms.

## 2020-05-31 NOTE — Assessment & Plan Note (Signed)
Previous testosterone level wnl in 2013. Will recheck AM testosterone levels and TSH. Medications reviewed and no obvious causes of ED. Patient has diabetes, CKD and CAD. He is also on Paxil, which are going to start to wean today given age group and patient concerns for polypharmacy. Normal physical exam and no visual field deficit. Will start cialis 5 mg (can increase to 10 mg). Drug information reviewed with patient and return precautions provided. Will follow up with patient regarding results and further evaluation and management

## 2020-05-31 NOTE — Progress Notes (Signed)
    SUBJECTIVE:  CHIEF COMPLAINT / HPI:   1. Polypharmacy  Patient returning today for medication reconciliation which was conducted by the pharmacy resident.   2. Erectile dysfunction, unspecified erectile dysfunction type Patient reports loss of spontaneous erections for 1 year. Patient denies any history of genital surgery, ejaculatory disorder, low testosterone. Reports that inability to obtain erection has been traumatic and is leading patient to feel significantly depressed in those situations. Patient denies any changes in vision  PERTINENT  PMH / PSH: Hx seizures,CKD IV, DM, a fib on eliquis, CHF  OBJECTIVE:  BP 110/60   Pulse 80   Ht 6' (1.829 m)   Wt 233 lb 6 oz (105.9 kg)   SpO2 98%   BMI 31.65 kg/m   General: Well appearing male, NAD  HEENT: Normal visual field testing  Genital: normal femoral pulses bilateraly. Penis appears normal with no rashes, lesions, or trauma on glans and shaft. No structural abnormalities appreciated. Not circumcised. There is no erythema or discharge from the meatus. Scrotum appears normal on inspection and without masses on palpation. Testes are symmetric in size. There is no tenderness to epididymis and spermatic cord.   ASSESSMENT/PLAN:  Erectile dysfunction Previous testosterone level wnl in 2013. Will recheck AM testosterone levels and TSH. Medications reviewed and no obvious causes of ED. Patient has diabetes, CKD and CAD. He is also on Paxil, which are going to start to wean today given age group and patient concerns for polypharmacy. Normal physical exam and no visual field deficit. Will start cialis 5 mg (can increase to 10 mg). Drug information reviewed with patient and return precautions provided. Will follow up with patient regarding results and further evaluation and management   Polypharmacy After careful review of medications, can discontinue claritin and monitor for symptoms. See depression for paxil taper   Depression Will start  tapering Paxil. His PHQ 9 is 0 today and has been stable. He has been on Paxil for several years. Given notorious difficulty weaning paxil, will start very slow taper over months. Sent #30 30mg  paxil to pharmacy. Return precautions provided. Follow up in 3 weeks before next dose change.  Additionally, spoke with Dr. Valentina Lucks for further recommendations. If patient develops symptoms, can consider starting prozac 10 mg to help with withdrawal symptoms.     [name], MD [place]

## 2020-06-01 LAB — BASIC METABOLIC PANEL
BUN/Creatinine Ratio: 8 — ABNORMAL LOW (ref 10–24)
BUN: 27 mg/dL (ref 8–27)
CO2: 25 mmol/L (ref 20–29)
Calcium: 9.8 mg/dL (ref 8.6–10.2)
Chloride: 100 mmol/L (ref 96–106)
Creatinine, Ser: 3.29 mg/dL — ABNORMAL HIGH (ref 0.76–1.27)
GFR calc Af Amer: 21 mL/min/{1.73_m2} — ABNORMAL LOW (ref >59)
GFR calc non Af Amer: 18 mL/min/{1.73_m2} — ABNORMAL LOW (ref >59)
Glucose: 105 mg/dL — ABNORMAL HIGH (ref 65–99)
Potassium: 4.2 mmol/L (ref 3.5–5.2)
Sodium: 141 mmol/L (ref 134–144)

## 2020-06-01 LAB — TSH: TSH: 1.03 u[IU]/mL (ref 0.450–4.500)

## 2020-06-02 ENCOUNTER — Encounter: Payer: Self-pay | Admitting: Family Medicine

## 2020-06-02 ENCOUNTER — Other Ambulatory Visit: Payer: Self-pay | Admitting: Family Medicine

## 2020-06-02 MED ORDER — GABAPENTIN 300 MG PO CAPS: 300.0000 mg | ORAL_CAPSULE | Freq: Every day | ORAL | 3 refills | Status: DC

## 2020-06-02 NOTE — Progress Notes (Signed)
Repeat BMP for elevated creatinine.  Also, obtain testosterone, as it was not collected at last lab visit.   Gabapentin resent to [facility] as the medication is less expensive there.

## 2020-06-04 DIAGNOSIS — E1165 Type 2 diabetes mellitus with hyperglycemia: Secondary | ICD-10-CM | POA: Diagnosis not present

## 2020-06-05 ENCOUNTER — Other Ambulatory Visit: Payer: Self-pay | Admitting: Family Medicine

## 2020-06-05 DIAGNOSIS — N184 Chronic kidney disease, stage 4 (severe): Secondary | ICD-10-CM

## 2020-06-05 DIAGNOSIS — N529 Male erectile dysfunction, unspecified: Secondary | ICD-10-CM

## 2020-06-05 NOTE — Progress Notes (Signed)
BMP for elevated creatinine.  Testosterone - new order placed as last order not correct resulting agency per Herbie [place].

## 2020-06-05 NOTE — Addendum Note (Signed)
Addended by: Zettie Cooley E on: 06/05/2020 01:28 PM   Modules accepted: Orders

## 2020-06-06 ENCOUNTER — Other Ambulatory Visit: Payer: Self-pay | Admitting: Internal Medicine

## 2020-06-07 ENCOUNTER — Other Ambulatory Visit: Payer: Self-pay

## 2020-06-07 ENCOUNTER — Other Ambulatory Visit: Payer: HMO

## 2020-06-07 DIAGNOSIS — E1122 Type 2 diabetes mellitus with diabetic chronic kidney disease: Secondary | ICD-10-CM | POA: Diagnosis not present

## 2020-06-07 DIAGNOSIS — D631 Anemia in chronic kidney disease: Secondary | ICD-10-CM | POA: Diagnosis not present

## 2020-06-07 DIAGNOSIS — N529 Male erectile dysfunction, unspecified: Secondary | ICD-10-CM | POA: Diagnosis not present

## 2020-06-07 DIAGNOSIS — N184 Chronic kidney disease, stage 4 (severe): Secondary | ICD-10-CM

## 2020-06-07 DIAGNOSIS — N2581 Secondary hyperparathyroidism of renal origin: Secondary | ICD-10-CM | POA: Diagnosis not present

## 2020-06-07 DIAGNOSIS — I129 Hypertensive chronic kidney disease with stage 1 through stage 4 chronic kidney disease, or unspecified chronic kidney disease: Secondary | ICD-10-CM | POA: Diagnosis not present

## 2020-06-08 LAB — BASIC METABOLIC PANEL
BUN/Creatinine Ratio: 10 (ref 10–24)
BUN: 32 mg/dL — ABNORMAL HIGH (ref 8–27)
CO2: 24 mmol/L (ref 20–29)
Calcium: 9.7 mg/dL (ref 8.6–10.2)
Chloride: 100 mmol/L (ref 96–106)
Creatinine, Ser: 3.24 mg/dL — ABNORMAL HIGH (ref 0.76–1.27)
GFR calc Af Amer: 21 mL/min/{1.73_m2} — ABNORMAL LOW (ref >59)
GFR calc non Af Amer: 19 mL/min/{1.73_m2} — ABNORMAL LOW (ref >59)
Glucose: 136 mg/dL — ABNORMAL HIGH (ref 65–99)
Potassium: 4 mmol/L (ref 3.5–5.2)
Sodium: 141 mmol/L (ref 134–144)

## 2020-06-08 LAB — TESTOSTERONE: Testosterone: 298 ng/dL (ref 264–916)

## 2020-06-12 ENCOUNTER — Other Ambulatory Visit: Payer: Self-pay | Admitting: Cardiology

## 2020-06-12 DIAGNOSIS — I4821 Permanent atrial fibrillation: Secondary | ICD-10-CM

## 2020-06-12 MED ORDER — EMPAGLIFLOZIN 10 MG PO TABS: 10.0000 mg | ORAL_TABLET | Freq: Every day | ORAL | 2 refills | Status: DC

## 2020-06-12 NOTE — Telephone Encounter (Signed)
*  STAT* If patient is at the pharmacy, call can be transferred to refill team.   1. Which medications need to be refilled? (please list name of each medication and dose if known)  apixaban (ELIQUIS) 5 MG TABS tablet empagliflozin (JARDIANCE) 10 MG TABS tablet  2. Which pharmacy/location (including street and city if local pharmacy) is medication to be sent to? [facility] [address] [place]  3. Do they need a 30 day or 90 day supply? 90 day

## 2020-06-13 MED ORDER — APIXABAN 5 MG PO TABS: 5.0000 mg | ORAL_TABLET | Freq: Two times a day (BID) | ORAL | 1 refills | Status: DC

## 2020-06-13 NOTE — Telephone Encounter (Signed)
Prescription refill request for Eliquis received. Indication: a fib Last office visit: 03/21/20 Scr: 3.24 Age: 70 Weight: 105kg

## 2020-06-19 ENCOUNTER — Telehealth: Payer: Self-pay | Admitting: Cardiology

## 2020-06-19 NOTE — Telephone Encounter (Signed)
**Note De-Identified Ligia Duguay Obfuscation** No answer so I left a message on the pts VM asking him to call Jeani Hawking back at [phone].

## 2020-06-19 NOTE — Telephone Encounter (Signed)
Called pt and pt stated that he needed a copay card for his Eliquis. I told the pt that if would leave a 10 dollar copay card at the front desk for pt to pick up. I advised pt that if he has any other problems, questions or concerns, to give our office a call. Pt verbalized understanding.

## 2020-06-19 NOTE — Telephone Encounter (Signed)
Pt c/o medication issue:  1. Name of Medication: apixaban (ELIQUIS) 5 MG TABS tablet  2. How are you currently taking this medication (dosage and times per day)? 1 tablet twice a day  3. Are you having a reaction (difficulty breathing--STAT)? no  4. What is your medication issue? Patient would like to know if there are any coupons for the medication.

## 2020-06-20 ENCOUNTER — Other Ambulatory Visit: Payer: Self-pay

## 2020-06-20 ENCOUNTER — Encounter: Payer: Self-pay | Admitting: Family Medicine

## 2020-06-20 ENCOUNTER — Ambulatory Visit (INDEPENDENT_AMBULATORY_CARE_PROVIDER_SITE_OTHER): Payer: HMO | Admitting: Family Medicine

## 2020-06-20 DIAGNOSIS — Z794 Long term (current) use of insulin: Secondary | ICD-10-CM

## 2020-06-20 DIAGNOSIS — E119 Type 2 diabetes mellitus without complications: Secondary | ICD-10-CM | POA: Diagnosis not present

## 2020-06-20 DIAGNOSIS — F32A Depression, unspecified: Secondary | ICD-10-CM | POA: Diagnosis not present

## 2020-06-20 DIAGNOSIS — Z596 Low income: Secondary | ICD-10-CM | POA: Diagnosis not present

## 2020-06-20 NOTE — Patient Instructions (Addendum)
In two weeks, if you decide you can decrease to 20 mg of paxil, you can pick the medication up from the pharmacy (I will send it in today). If you decide you want to stay on the 30 mg for a month longer, call the office and ask for a refill.   I have scheduled you for a follow up appointment with Dr. Jeannine Kitten to follow up about further decreasing the paxil in one month. 07/24/20 at 10:30 AM   I would like you to come back to see Dr. Valentina Lucks for diabetes management. We'll call you with an appointment time.

## 2020-06-20 NOTE — Progress Notes (Addendum)
    SUBJECTIVE:  CHIEF COMPLAINT / HPI:   Medication Follow up  Paxil Taper  Decreased to 30 mg tablets of paxil. Started the 30 mg pills about two weeks ago. Felt like his mind was "fluctuating", more irritation, but all of these side effects are wearing. Patient also reports that he was abruptly discontinued on Opana a few weeks ago (worker's comp case managed by another physician), which could have caused some of these symptoms.   Out of Eliquis  Patient reports that he was out of eliquis. His cardiologist gave him a $10 copay card and [facility] did not accept due to medicare status. Paid $90 for 3 month supply of eliquis, which is not sustainable for him.   PERTINENT  PMH / PSH: Hx seizures,CKD IV, DM, a fib on eliquis, CHF  OBJECTIVE:  BP 128/60   Pulse 80   Ht 6' (1.829 m)   Wt 233 lb 3.2 oz (105.8 kg)   SpO2 99%   BMI 31.63 kg/m   General: well appearing male, NAD  Psych: Patient is well groomed with good hygiene.  Speech is normal with normal rate, latency, volume and intonation.  Behavior is normal with normal eye contact.  Patient is friendly, cooperative.  Tight and logical thought process is.  No abnormal thought content.  Mood is good with appropriate affect and range of emotion.  ASSESSMENT/PLAN:  Depression I have scheduled patient to follow up closely with Dr. Jeannine Kitten over the next two months to continue tapering. Patient is unsure if he wants to go down to 20 mg right now and would like to continue as he is only 2 weeks into his taper. I will send 20 mg #30 right now, but if in two weeks, pt feels that he would like to stay on 30 mg for a bit longer, he is to call the office for refill. Patient scheduled for f/u with Dr. Jeannine Kitten 2/28 @ 10:30 AM. Please continue to follow patient q 3-4 weeks during this taper. If he develops any antidepressant withdrawal symptoms, can also start prozac 10 mg in addition to tapering doses of paxil.    Patient cannot afford medications After  patient left office, I checked my inbox and there was a packet of medication assistance forms to sign. Patient has enough for 3 months at this time. Hopefully, he will be approved. Patient reminded to let us know about any difficulties with obtaining medication.   Insulin dependent type 2 diabetes mellitus ([place]) Patient reports being compliant with all of his medications.  He takes 42 units of U2 100 Tresiba.  He has also been continuing his Trulicity.  Patient's next diabetes visit and A1C check at the end of March.  If patient has any issues regarding his diabetes during my maternity leave, please make an appointment with Dr. Valentina Lucks, as he has followed closely with him in the past.    [name], MD [place]

## 2020-06-22 ENCOUNTER — Encounter: Payer: Self-pay | Admitting: Family Medicine

## 2020-06-22 NOTE — Assessment & Plan Note (Signed)
I have scheduled patient to follow up closely with Dr. Jeannine Kitten over the next two months to continue tapering. Patient is unsure if he wants to go down to 20 mg right now and would like to continue as he is only 2 weeks into his taper. I will send 20 mg #30 right now, but if in two weeks, pt feels that he would like to stay on 30 mg for a bit longer, he is to call the office for refill. Patient scheduled for f/u with Dr. Jeannine Kitten 2/28 @ 10:30 AM. Please continue to follow patient q 3-4 weeks during this taper. If he develops any antidepressant withdrawal symptoms, can also start prozac 10 mg in addition to tapering doses of paxil.

## 2020-06-22 NOTE — Assessment & Plan Note (Addendum)
Patient reports being compliant with all of his medications.  He takes 42 units of U2 100 Tresiba.  He has also been continuing his Trulicity.  Patient's next diabetes visit and A1C check at the end of March.  If patient has any issues regarding his diabetes during my maternity leave, please make an appointment with Dr. Valentina Lucks, as he has followed closely with him in the past.

## 2020-06-22 NOTE — Assessment & Plan Note (Signed)
After patient left office, I checked my inbox and there was a packet of medication assistance forms to sign. Patient has enough for 3 months at this time. Hopefully, he will be approved. Patient reminded to let us know about any difficulties with obtaining medication.

## 2020-06-24 ENCOUNTER — Other Ambulatory Visit: Payer: Self-pay | Admitting: Family Medicine

## 2020-06-25 ENCOUNTER — Other Ambulatory Visit: Payer: Self-pay | Admitting: Family Medicine

## 2020-07-03 ENCOUNTER — Telehealth: Payer: Self-pay

## 2020-07-03 NOTE — Telephone Encounter (Signed)
Spoke to patient about needing proof of income and a LIS (Low income subsidy) letter of approval/denial for [employer] (jardiance) & [employer] (tresiba). Pt will try and find his SSI letter and will work on looking for his LIS denial as well. He mentioned if he could not find either, he would reach out to Social Security to request a new one.

## 2020-07-04 DIAGNOSIS — E1165 Type 2 diabetes mellitus with hyperglycemia: Secondary | ICD-10-CM | POA: Diagnosis not present

## 2020-07-05 ENCOUNTER — Telehealth: Payer: Self-pay

## 2020-07-05 ENCOUNTER — Other Ambulatory Visit: Payer: Self-pay | Admitting: Pharmacist

## 2020-07-05 MED ORDER — TRULICITY 1.5 MG/0.5ML ~~LOC~~ SOAJ: 1.5000 mg | SUBCUTANEOUS | 3 refills | Status: DC

## 2020-07-05 NOTE — Progress Notes (Signed)
Noted and agree. 

## 2020-07-05 NOTE — Telephone Encounter (Signed)
Spoke to patient about his approval for Eliquis through [employer] patient assistance. He spoke with them yesterday and scheduled his shipping. He will follow up with me in a few weeks to schedule refills.

## 2020-07-05 NOTE — Progress Notes (Signed)
Received notification from [place] ([place]) regarding approval for Eliquis. Patient assistance approved from 06/28/2020  to 05/26/21. Meds will ship to patients home.  Phone: [phone]

## 2020-07-05 NOTE — Progress Notes (Signed)
Fax communication from [employer] for patient on Trulicity. Patient has been seen in Rx Clinic in the past.  Last interaction 05/24/2020.  Clarification of dose escalation needed.   Patient dose escalation from 0.75mg  weekly to 1.5mg  weekly intended. (4.5mg  received by [facility]).   Clarified with [facility] dose of 1.5mg  weekly for Trulicity (dulaglutide)  New Rx sent by filling out new Anthony [name].

## 2020-07-06 ENCOUNTER — Telehealth: Payer: Self-pay | Admitting: Cardiology

## 2020-07-06 DIAGNOSIS — N184 Chronic kidney disease, stage 4 (severe): Secondary | ICD-10-CM

## 2020-07-06 DIAGNOSIS — Z95810 Presence of automatic (implantable) cardiac defibrillator: Secondary | ICD-10-CM

## 2020-07-06 DIAGNOSIS — I5022 Chronic systolic (congestive) heart failure: Secondary | ICD-10-CM

## 2020-07-06 DIAGNOSIS — E119 Type 2 diabetes mellitus without complications: Secondary | ICD-10-CM

## 2020-07-06 DIAGNOSIS — Z79899 Other long term (current) drug therapy: Secondary | ICD-10-CM

## 2020-07-06 NOTE — Telephone Encounter (Signed)
Please have him stop it for two weeks and see how he feels, also can you arrange for CMP and BNP? Thank you, KN

## 2020-07-06 NOTE — Telephone Encounter (Signed)
Spoke with pt and this week has noted severe weakness "muscle " and SOB Pt thinks it maybe coming from the new medicine started back in November 2021 Empagliflozin. Will forward message to Dr Meda Coffee to review .Adonis Housekeeper

## 2020-07-06 NOTE — Telephone Encounter (Signed)
Pt c/o medication issue:  1. Name of Medication: Empagliflozin  2. How are you currently taking this medication (dosage and times per day)?  1 time a day  3. Are you having a reaction (difficulty breathing--STAT)?yes  4. What is your medication issue? Tired, weak, shortr of breath        r

## 2020-07-07 NOTE — Telephone Encounter (Signed)
Spoke with the pt and advised him that per Dr. Meda Coffee, she wants him to hold his Jardiance for 2 weeks, or until he comes to see [place] in clinic on 2/21, and we will draw labs on him same day.  Advised the pt to hold this med until he see's [place], and come into the office a few minutes early on 2/21, so that he can stop by the lab first, so we can draw a CMET and PRO-BNP. Pt verbalized understanding and agrees with this plan. Will place a hold note on the pts Jardiance, as indicated by Dr. Meda Coffee. Scheduled the pt to have labs done same day as he see's [place] in clinic, on 2/21.

## 2020-07-10 NOTE — Progress Notes (Signed)
Received notification from [employer] regarding approval for TRULICITY 1.5MG /0.5ML. Patient assistance approved from 06/2020 to 05/26/21. Medicine will ship to patients home through auto refills.   Phone: [phone] [facility] pharmacy)

## 2020-07-10 NOTE — Progress Notes (Signed)
Patient ID: Anthony [name], male   DOB: August 03, 1950, 70 y.o.   MRN: [id number]

## 2020-07-11 ENCOUNTER — Other Ambulatory Visit: Payer: Self-pay | Admitting: Family Medicine

## 2020-07-11 DIAGNOSIS — Z961 Presence of intraocular lens: Secondary | ICD-10-CM | POA: Diagnosis not present

## 2020-07-11 DIAGNOSIS — H52203 Unspecified astigmatism, bilateral: Secondary | ICD-10-CM | POA: Diagnosis not present

## 2020-07-11 DIAGNOSIS — E113293 Type 2 diabetes mellitus with mild nonproliferative diabetic retinopathy without macular edema, bilateral: Secondary | ICD-10-CM | POA: Diagnosis not present

## 2020-07-11 LAB — HM DIABETES EYE EXAM

## 2020-07-17 ENCOUNTER — Ambulatory Visit (INDEPENDENT_AMBULATORY_CARE_PROVIDER_SITE_OTHER): Payer: HMO | Admitting: Cardiology

## 2020-07-17 ENCOUNTER — Other Ambulatory Visit: Payer: Self-pay

## 2020-07-17 ENCOUNTER — Other Ambulatory Visit: Payer: HMO | Admitting: *Deleted

## 2020-07-17 ENCOUNTER — Encounter: Payer: Self-pay | Admitting: Cardiology

## 2020-07-17 VITALS — BP 146/76 | HR 80 | Ht 72.0 in | Wt 233.0 lb

## 2020-07-17 DIAGNOSIS — I10 Essential (primary) hypertension: Secondary | ICD-10-CM | POA: Diagnosis not present

## 2020-07-17 DIAGNOSIS — Z794 Long term (current) use of insulin: Secondary | ICD-10-CM

## 2020-07-17 DIAGNOSIS — I2510 Atherosclerotic heart disease of native coronary artery without angina pectoris: Secondary | ICD-10-CM | POA: Diagnosis not present

## 2020-07-17 DIAGNOSIS — I5022 Chronic systolic (congestive) heart failure: Secondary | ICD-10-CM

## 2020-07-17 DIAGNOSIS — Z95810 Presence of automatic (implantable) cardiac defibrillator: Secondary | ICD-10-CM

## 2020-07-17 DIAGNOSIS — I4821 Permanent atrial fibrillation: Secondary | ICD-10-CM | POA: Diagnosis not present

## 2020-07-17 DIAGNOSIS — E119 Type 2 diabetes mellitus without complications: Secondary | ICD-10-CM

## 2020-07-17 DIAGNOSIS — E782 Mixed hyperlipidemia: Secondary | ICD-10-CM

## 2020-07-17 DIAGNOSIS — N184 Chronic kidney disease, stage 4 (severe): Secondary | ICD-10-CM

## 2020-07-17 DIAGNOSIS — Z79899 Other long term (current) drug therapy: Secondary | ICD-10-CM

## 2020-07-17 NOTE — Progress Notes (Signed)
Cardiology Office Note:    Date:  07/17/2020   ID:  Anthony [name], Anthony [name] 1950-10-01, MRN [id number]  PCP:  [name], MD  [facility] Cardiologist:  Ena Dawley, MD  [place] Electrophysiologist:  None   Referring MD: [name], MD   Chief complain: DOE  History of Present Illness:    Anthony [name] is a 70 y.o. male with a hx of atrial fibrillation, AV ablation in 07/2017 with PM/ICD placement, CAD, s/p PCI In 2003 of the circumflex lesion extending into the second obtuse marginal branch with a drug-eluting stent.  Myoview 2007 with no ischemia and EF of 59%, NICMP with LVEF 25-30% in 2018 improved to 55-60% in 2019, he was evaluated for possible cardiac amyloidosis (Negative PYP scan), hypertension, CKD stage 4, chronic combined systolic and diastolic CHF.  The patient is coming after 6 months, he has been doing great, his weight is stable, no lower extremity edema orthopnea or proximal nocturnal dyspnea.  He states that his blood pressures at home are in 120s, he does not have signs of orthostatic hypotension, no falls.  At the last visit he complained of worsening dyspnea and echocardiogram was checked that showed normal systolic and diastolic function and no valvular abnormalities.  He tolerates his medications well, he has no palpitations and no bleeding on Eliquis.  Past Medical History:  Diagnosis Date  . Arthritis   . Benign neoplasm of descending colon   . Benign neoplasm of sigmoid colon   . Benign neoplasm of transverse colon   . CAD in native artery    a. reported MI 2000, 2001, s/p stenting of the circumflex lesion extending into the second obtuse marginal branch with a drug-eluting stent in 2003. b. low risk nuc 2015.  Marland Kitchen Chronic atrial fibrillation ([place])   . Chronic combined systolic and diastolic CHF (congestive heart failure) ([place])    a. Previously diastolic, then EF 51-02% in 10/2016.  . CKD (chronic kidney disease), stage III ([place])   . Depression    . Diabetes mellitus   . Edema of extremities   . Erectile dysfunction   . GERD (gastroesophageal reflux disease)   . Hyperlipidemia   . Hypertension   . Myocardial infarction ([place]) 2000&2001  . Obesity   . Onychomycosis   . OSA (obstructive sleep apnea)   . S/P ICD (internal cardiac defibrillator) procedure and BiV device,  07/25/17 Medtronic  07/26/2017  . Seizures ([place])   . Sleep apnea   . Stroke ([place])   . Tubular adenoma of colon 10/2002    Past Surgical History:  Procedure Laterality Date  . ANGIOPLASTY  2001   stent x 1  . AV NODE ABLATION N/A 08/20/2017   Procedure: AV NODE ABLATION;  Surgeon: Deboraha Sprang, MD;  Location: [facility];  Service: Cardiovascular;  Laterality: N/A;  . BIV ICD INSERTION CRT-D N/A 07/25/2017   Procedure: BIV ICD INSERTION CRT-D;  Surgeon: Deboraha Sprang, MD;  Location: [facility];  Service: Cardiovascular;  Laterality: N/A;  . COLONOSCOPY  2000?   negative  . COLONOSCOPY WITH PROPOFOL N/A 02/08/2019   Procedure: COLONOSCOPY WITH PROPOFOL;  Surgeon: Ladene Artist, MD;  Location: [facility];  Service: Endoscopy;  Laterality: N/A;  . FOOT ARTHROTOMY Right   . POLYPECTOMY  02/08/2019   Procedure: POLYPECTOMY;  Surgeon: Ladene Artist, MD;  Location: [facility];  Service: Endoscopy;;  . TOE AMPUTATION Left 2012    Current Medications:  Current Meds  Medication Sig  . apixaban (ELIQUIS) 5 MG TABS tablet Take 1 tablet (5 mg total) by mouth 2 (two) times daily.  . calcitRIOL (ROCALTROL) 0.5 MCG capsule Take 0.5 mcg by mouth daily at 12 noon.   . carvedilol (COREG) 25 MG tablet Take 1 tablet (25 mg total) by mouth 2 (two) times daily.  . Continuous Blood Gluc Receiver (FREESTYLE LIBRE 14 DAY READER) DEVI Apply topically as directed.  . Continuous Blood Gluc Sensor (FREESTYLE LIBRE 14 DAY SENSOR) MISC 1 Device by Does not apply route every 14 (fourteen) days.  . Dulaglutide (TRULICITY) 1.5 ER/7.4YC SOPN Inject 1.5 mg into the skin  once a week.  . empagliflozin (JARDIANCE) 10 MG TABS tablet Take 1 tablet (10 mg total) by mouth daily before breakfast.  . furosemide (LASIX) 40 MG tablet Take 2 tablets (80 mg total) by mouth daily.  Marland Kitchen gabapentin (NEURONTIN) 300 MG capsule Take 1 capsule (300 mg total) by mouth at bedtime.  Marland Kitchen glucose blood (ONETOUCH VERIO) test strip Use as instructed  . hydrALAZINE (APRESOLINE) 50 MG tablet Take 50 mg by mouth 3 (three) times daily.  . insulin degludec (TRESIBA FLEXTOUCH) 200 UNIT/ML FlexTouch Pen Inject 22 Units into the skin daily.  . Insulin Pen Needle (PEN NEEDLES) 32G X 4 MM MISC 1 Device by Does not apply route as directed.  . Lancets (ONETOUCH ULTRASOFT) lancets Use as instructed  . loratadine (CLARITIN) 10 MG tablet Take 10 mg by mouth daily.  Marland Kitchen losartan (COZAAR) 25 MG tablet Take 1 tablet (25 mg total) by mouth daily.  . Multiple Vitamin (MULTIVITAMIN) tablet Take 1 tablet by mouth daily.    . nitroGLYCERIN (NITROSTAT) 0.4 MG SL tablet Place 1 tablet (0.4 mg total) under the tongue every 5 (five) minutes as needed for chest pain. If no relief by 3rd tab, call 911  . omeprazole (PRILOSEC) 40 MG capsule TAKE ONE CAPSULE BY MOUTH DAILY BEFORE SUPPER  . oxymorphone (OPANA) 5 MG tablet Take 5 mg by mouth 3 (three) times daily as needed for pain (max 3 tablets daily). Per Dr Brien Few  . PARoxetine (PAXIL) 30 MG tablet TAKE 1 TABLET BY MOUTH EVERY DAY  . potassium chloride SA (KLOR-CON M20) 20 MEQ tablet Take 1 tablet (20 mEq total) by mouth 2 (two) times daily.  . rosuvastatin (CRESTOR) 20 MG tablet TAKE 1 TABLET BY MOUTH EVERY DAY  . tadalafil (CIALIS) 5 MG tablet Take 1 tablet (5 mg total) by mouth daily as needed for erectile dysfunction.  . tamsulosin (FLOMAX) 0.4 MG CAPS capsule Take 0.4 mg by mouth.  . vitamin B-12 (CYANOCOBALAMIN) 1000 MCG tablet Take 1,000 mcg by mouth daily.     Allergies:   Enalapril maleate   Social History   Socioeconomic History  . Marital status: Married     Spouse name: [name]  . Number of children: 2  . Years of education: GED  . Highest education level: Not on file  Occupational History  . Occupation: CUSTOMER SERVICES    Employer: [employer]    Comment: [employer]  Tobacco Use  . Smoking status: Former Smoker    Packs/day: 0.25    Years: 45.00    Pack years: 11.25    Types: Cigarettes    Start date: 05/28/1967    Quit date: 10/28/2018    Years since quitting: 1.7  . Smokeless tobacco: Never Used  Vaping Use  . Vaping Use: Never used  Substance and Sexual Activity  . Alcohol use:  No    Comment: quit in 1993  . Drug use: No  . Sexual activity: Not on file  Other Topics Concern  . Not on file  Social History Narrative   Patient is right handed, consumes caffeine rarely. Patient resides in home with wife.   Social Determinants of Health   Financial Resource Strain: Not on file  Food Insecurity: Not on file  Transportation Needs: Not on file  Physical Activity: Not on file  Stress: Not on file  Social Connections: Not on file     Family History: The patient's family history includes CVA in his mother; Diabetes in his mother; Heart attack in his father and mother. There is no history of Colon cancer or Stomach cancer.  ROS:   Please see the history of present illness.    All other systems reviewed and are negative.  EKGs/Labs/Other Studies Reviewed:    The following studies were reviewed today:  EKG:  EKG is ordered today.  The ekg ordered today demonstrates atrial fibrillation, V paced rhythm, 79 BPM, unchanged from prior, this was personally reviewed.  Recent Labs: 03/21/2020: Hemoglobin 14.3; Platelets 142 05/31/2020: TSH 1.030 06/07/2020: BUN 32; Creatinine, Ser 3.24; Potassium 4.0; Sodium 141  Recent Lipid Panel    Component Value Date/Time   CHOL 132 10/12/2019 1722   TRIG 89 10/12/2019 1722   HDL 38 (L) 10/12/2019 1722   CHOLHDL 3.5 10/12/2019 1722   CHOLHDL 3.2 02/28/2016 0906   VLDL 19 02/28/2016 0906    LDLCALC 77 10/12/2019 1722   LDLDIRECT 58 11/06/2011 1434    Physical Exam:    VS:  BP (!) 146/76   Pulse 80   Ht 6' (1.829 m)   Wt 233 lb (105.7 kg)   SpO2 98%   BMI 31.60 kg/m     Wt Readings from Last 3 Encounters:  07/17/20 233 lb (105.7 kg)  06/20/20 233 lb 3.2 oz (105.8 kg)  05/30/20 233 lb 6 oz (105.9 kg)    GEN: Well nourished, well developed in no acute distress HEENT: Normal NECK: No JVD; No carotid bruits LYMPHATICS: No lymphadenopathy CARDIAC: RRR, no murmurs, rubs, gallops RESPIRATORY:  Clear to auscultation without rales, wheezing or rhonchi  ABDOMEN: Soft, non-tender, non-distended MUSCULOSKELETAL:  No edema; No deformity  SKIN: Warm and dry NEUROLOGIC:  Alert and oriented x 3 PSYCHIATRIC:  Normal affect   ASSESSMENT:    1. Coronary artery disease involving native coronary artery of native heart without angina pectoris   2. Atrial fibrillation, permanent ([place])   3. Primary hypertension   4. Mixed hyperlipidemia      PLAN:    In order of problems listed above:  1. CAD -he is asymptomatic, most recent echocardiogram showed normal systolic and diastolic function no valvular abnormalities, will continue carvedilol, losartan, rosuvastatin, Jardiance, no aspirin as he is on Eliquis. 2. Chronic diastolic CHF -tolerating carvedilol well, no bleeding with Eliquis, most recent hemoglobin 14.3.  Persistent a-fib, s/p AVN ablation, V paced rhythm, high HR< switch to carvedilol as above, continue Eliquis, no bleeding 3. Hyperlipidemia - tolerates rosuvastatin, most recent lipids LDL 77, HDL 38 and triglycerides 89. 4. Hypertension -improved with carvedilol, at home blood pressure in 120s. 5. Diabetes mellitus, on insulin, the added Jardiance that he is tolerating well, his most recent hemoglobin A1c was 8.9%.  Medication Adjustments/Labs and Tests Ordered: Current medicines are reviewed at length with the patient today.  Concerns regarding medicines are outlined  above.  No orders of the defined  types were placed in this encounter.  No orders of the defined types were placed in this encounter.   Patient Instructions  Medication Instructions:  Your provider recommends that you continue on your current medications as directed. Please refer to the Current Medication list given to you today.   *If you need a refill on your cardiac medications before your next appointment, please call your pharmacy*   Follow-Up: At [facility], you and your health needs are our priority.  As part of our continuing mission to provide you with exceptional heart care, we have created designated Provider Care Teams.  These Care Teams include your primary Cardiologist (physician) and Advanced Practice Providers (APPs -  Physician Assistants and Nurse Practitioners) who all work together to provide you with the care you need, when you need it. Your next appointment:   6 month(s) The format for your next appointment:   In Person Provider:   Gwyndolyn Kaufman, MD     Signed, Ena Dawley, MD  07/17/2020 10:13 AM    [place]

## 2020-07-17 NOTE — Patient Instructions (Signed)
Medication Instructions:  Your provider recommends that you continue on your current medications as directed. Please refer to the Current Medication list given to you today.   *If you need a refill on your cardiac medications before your next appointment, please call your pharmacy*   Follow-Up: At [facility], you and your health needs are our priority.  As part of our continuing mission to provide you with exceptional heart care, we have created designated Provider Care Teams.  These Care Teams include your primary Cardiologist (physician) and Advanced Practice Providers (APPs -  Physician Assistants and Nurse Practitioners) who all work together to provide you with the care you need, when you need it. Your next appointment:   6 month(s) The format for your next appointment:   In Person Provider:   Gwyndolyn Kaufman, MD

## 2020-07-24 ENCOUNTER — Other Ambulatory Visit: Payer: Self-pay

## 2020-07-24 ENCOUNTER — Ambulatory Visit (INDEPENDENT_AMBULATORY_CARE_PROVIDER_SITE_OTHER): Payer: HMO | Admitting: Family Medicine

## 2020-07-24 DIAGNOSIS — F32A Depression, unspecified: Secondary | ICD-10-CM

## 2020-07-24 DIAGNOSIS — Z794 Long term (current) use of insulin: Secondary | ICD-10-CM | POA: Diagnosis not present

## 2020-07-24 DIAGNOSIS — E119 Type 2 diabetes mellitus without complications: Secondary | ICD-10-CM | POA: Diagnosis not present

## 2020-07-24 NOTE — Progress Notes (Signed)
    SUBJECTIVE:   CHIEF COMPLAINT / HPI:   Depression: Patient is still taking his 30 mg of Paxil because he had a 90-day supply is still 2 months left on it.  Mostly concerned about tapering too quickly.  States he would like to finish out this prescription and then go to the 20 mg that was called in previously by Dr. [name].  No worsening of depression after taper.  Patient states he is working on getting his Eliquis assistance.  Does not need any help with that At this time.  Diabetes: Patient states he felt sluggish and short of breath after taking the Jardiance.  This is not a sudden change but a gradual change.  Is resolved after stopping the medication.  Wants to know if he can start taking the medicine again but try doing it every other day instead  PERTINENT  PMH / PSH: Diabetes, depression  OBJECTIVE:   BP (!) 144/80   Pulse 86   Ht 6' (1.829 m)   Wt 234 lb 9.6 oz (106.4 kg)   SpO2 99%   BMI 31.82 kg/m   General: Alert and oriented.  No acute distress CV: Regular rate and rhythm, no murmurs. Pulmonary: Lungs clear auscultation bilaterally, no wheezes or crackles. Extremities: No pedal edema. Psych: Pleasant affect, spontaneous speech, makes eye contact.  ASSESSMENT/PLAN:   Depression We will continue to finish out the 30 mg dose that he has and then patient will pick up the 20 mg Needs a refill.  Advised to schedule appointment with me or Dr. [name] if she is available in approximately 1 month.  Insulin dependent type 2 diabetes mellitus ([place]) Discussed with patient restarting Jardiance slowly, every other day.  This will patient suggestion.  Told patient it was okay but if he starts developing similar symptoms to what he is experiencing previously he should stop the medication     Benay Pike, MD [place]

## 2020-07-24 NOTE — Patient Instructions (Signed)
It was nice to meet today,  I think we can continue with the plan of taking your Paxil at the current dose of 30 mg until you run out in approximately 2 months and then using the 20 mg tablets that Dr. [name] sent in.  You can try restarting the Jardiance every other day but if you start to feel sluggish or short of breath I would stop the medicine again and discuss it further with Dr. [name].  Please schedule follow-up appoint with Dr. [name] for about 6 to 8 weeks from now.  Have a great day,  Clemetine Marker, MD

## 2020-07-26 ENCOUNTER — Other Ambulatory Visit: Payer: Self-pay

## 2020-07-26 ENCOUNTER — Encounter: Payer: Self-pay | Admitting: Podiatry

## 2020-07-26 ENCOUNTER — Ambulatory Visit (INDEPENDENT_AMBULATORY_CARE_PROVIDER_SITE_OTHER): Payer: HMO | Admitting: Podiatry

## 2020-07-26 DIAGNOSIS — M79609 Pain in unspecified limb: Secondary | ICD-10-CM | POA: Diagnosis not present

## 2020-07-26 DIAGNOSIS — E119 Type 2 diabetes mellitus without complications: Secondary | ICD-10-CM

## 2020-07-26 DIAGNOSIS — B351 Tinea unguium: Secondary | ICD-10-CM | POA: Diagnosis not present

## 2020-07-26 DIAGNOSIS — D689 Coagulation defect, unspecified: Secondary | ICD-10-CM

## 2020-07-26 DIAGNOSIS — Q828 Other specified congenital malformations of skin: Secondary | ICD-10-CM

## 2020-07-26 NOTE — Assessment & Plan Note (Signed)
Discussed with patient restarting Jardiance slowly, every other day.  This will patient suggestion.  Told patient it was okay but if he starts developing similar symptoms to what he is experiencing previously he should stop the medication

## 2020-07-26 NOTE — Assessment & Plan Note (Signed)
We will continue to finish out the 30 mg dose that he has and then patient will pick up the 20 mg Needs a refill.  Advised to schedule appointment with me or Dr. [name] if she is available in approximately 1 month.

## 2020-07-26 NOTE — Progress Notes (Signed)
This patient returns to my office for at risk foot care.  This patient requires this care by a professional since this patient will be at risk due to having venous insufficiency, chronic kidney disease stage IV diabetes and coagulation defect.  Patient is taking eliquiss.  This patient is unable to cut nails himself since the patient cannot reach his nails.These nails are painful walking and wearing shoes.  This patient presents for at risk foot care today.  General Appearance  Alert, conversant and in no acute stress.  Vascular  Dorsalis pedis and posterior tibial  pulses are palpable  bilaterally.  Capillary return is within normal limits  bilaterally. Temperature is within normal limits  bilaterally.  Neurologic  Senn-Weinstein monofilament wire test within normal limits  bilaterally. Muscle power within normal limits bilaterally.  Nails Thick disfigured discolored nails with subungual debris  from hallux to fifth toes bilaterally. No evidence of bacterial infection or drainage bilaterally.  Orthopedic  No limitations of motion  feet .  No crepitus or effusions noted.  No bony pathology or digital deformities noted.  Amputation second toe left foot.  Skin  normotropic skin with no porokeratosis noted bilaterally.  No signs of infections or ulcers noted.  Porokeratosis sub 4 left and sub 5th right   Onychomycosis  Pain in right toes  Pain in left toes  Consent was obtained for treatment procedures.   Mechanical debridement of nails 1-5  Right and 1,3-5 left foot. performed with a nail nipper.  Filed with dremel without incident. Debridement of the porokeratosis  B/L with # 15 blade.   Return office visit   10 weeks                   Told patient to return for periodic foot care and evaluation due to potential at risk complications.   Gardiner Barefoot DPM

## 2020-08-03 DIAGNOSIS — E1165 Type 2 diabetes mellitus with hyperglycemia: Secondary | ICD-10-CM | POA: Diagnosis not present

## 2020-08-08 ENCOUNTER — Ambulatory Visit (INDEPENDENT_AMBULATORY_CARE_PROVIDER_SITE_OTHER): Payer: HMO

## 2020-08-08 DIAGNOSIS — I4821 Permanent atrial fibrillation: Secondary | ICD-10-CM | POA: Diagnosis not present

## 2020-08-08 LAB — CUP PACEART REMOTE DEVICE CHECK
Battery Remaining Longevity: 18 mo
Battery Voltage: 2.91 V
Brady Statistic AP VP Percent: 0 %
Brady Statistic AP VS Percent: 0 %
Brady Statistic AS VP Percent: 0 %
Brady Statistic AS VS Percent: 0 %
Brady Statistic RA Percent Paced: 0 %
Brady Statistic RV Percent Paced: 99.51 %
Date Time Interrogation Session: 20220315044224
HighPow Impedance: 70 Ohm
Implantable Lead Implant Date: 20190301
Implantable Lead Implant Date: 20190301
Implantable Lead Location: 753858
Implantable Lead Location: 753860
Implantable Pulse Generator Implant Date: 20190301
Lead Channel Impedance Value: 1178 Ohm
Lead Channel Impedance Value: 1197 Ohm
Lead Channel Impedance Value: 1235 Ohm
Lead Channel Impedance Value: 1235 Ohm
Lead Channel Impedance Value: 1235 Ohm
Lead Channel Impedance Value: 1235 Ohm
Lead Channel Impedance Value: 336.648
Lead Channel Impedance Value: 336.648
Lead Channel Impedance Value: 340.944
Lead Channel Impedance Value: 351.5 Ohm
Lead Channel Impedance Value: 356.187
Lead Channel Impedance Value: 399 Ohm
Lead Channel Impedance Value: 4047 Ohm
Lead Channel Impedance Value: 475 Ohm
Lead Channel Impedance Value: 646 Ohm
Lead Channel Impedance Value: 703 Ohm
Lead Channel Impedance Value: 703 Ohm
Lead Channel Impedance Value: 722 Ohm
Lead Channel Pacing Threshold Amplitude: 0.375 V
Lead Channel Pacing Threshold Amplitude: 3.5 V
Lead Channel Pacing Threshold Pulse Width: 0.4 ms
Lead Channel Pacing Threshold Pulse Width: 1.5 ms
Lead Channel Sensing Intrinsic Amplitude: 13.5 mV
Lead Channel Sensing Intrinsic Amplitude: 13.5 mV
Lead Channel Setting Pacing Amplitude: 2.5 V
Lead Channel Setting Pacing Amplitude: 3.5 V
Lead Channel Setting Pacing Pulse Width: 0.4 ms
Lead Channel Setting Pacing Pulse Width: 1.5 ms
Lead Channel Setting Sensing Sensitivity: 0.3 mV

## 2020-08-10 ENCOUNTER — Telehealth: Payer: Self-pay | Admitting: Podiatry

## 2020-08-10 ENCOUNTER — Telehealth: Payer: Self-pay

## 2020-08-10 ENCOUNTER — Other Ambulatory Visit: Payer: Self-pay | Admitting: Family Medicine

## 2020-08-10 NOTE — Telephone Encounter (Signed)
Patient wants to inform Dr. Prudence Davidson that his insurance will pay for diabetic shoes as long as he has a written prescription for the shoe. Patient requesting call back.

## 2020-08-10 NOTE — Telephone Encounter (Signed)
Received notification from [place] regarding approval for TRESIBA 200U. Patient assistance approved from 08/10/20 to 04/25/21.  Meds will ship to family medicine clinic in the next 10-14 days.  Phone: [phone]

## 2020-08-10 NOTE — Telephone Encounter (Signed)
Monitor closely with renal function and losartan use.  Takes with lasix.

## 2020-08-11 ENCOUNTER — Telehealth: Payer: Self-pay | Admitting: Podiatry

## 2020-08-11 NOTE — Telephone Encounter (Signed)
Pt called stating he had left a message for Dr Prudence Davidson to call him yesterday and he has not heard anything back.  I told pt Dr Prudence Davidson is out of the office until next week. He then said he was interested in diabetic shoes. I read the last note from Dr Prudence Davidson and it did not mention diabetic shoes. I told pt that it has to be documented for [place] to send to the insurance and I would put a note on his next appt for Dr Prudence Davidson to discuss diabetic shoes at that appt.

## 2020-08-15 NOTE — Telephone Encounter (Signed)
Called patient and explained per Dr Prudence Davidson that he does qualify for diabetic shoes because of his amputation and that he can write a prescription for those or he can order at our office. He is scheduled/confirmed for Monday March 28th @2 :45.

## 2020-08-16 NOTE — Progress Notes (Signed)
Remote ICD transmission.   

## 2020-08-21 ENCOUNTER — Ambulatory Visit (INDEPENDENT_AMBULATORY_CARE_PROVIDER_SITE_OTHER): Payer: HMO | Admitting: Podiatry

## 2020-08-21 ENCOUNTER — Other Ambulatory Visit: Payer: Self-pay

## 2020-08-21 DIAGNOSIS — M2010 Hallux valgus (acquired), unspecified foot: Secondary | ICD-10-CM

## 2020-08-21 DIAGNOSIS — E119 Type 2 diabetes mellitus without complications: Secondary | ICD-10-CM

## 2020-08-21 NOTE — Progress Notes (Addendum)
Patient presented for foam casting for 3 pair custom diabetic shoe inserts. Patient is measured with a [employer] Device to be a size 13 m  Diabetic shoes are chosen from the [employer] step catalog. The shoes chosen are [id number]  The patient will be contacted when the shoes and inserts are ready to be picked up.   Gardiner Barefoot DPM

## 2020-08-28 ENCOUNTER — Telehealth: Payer: Self-pay

## 2020-08-28 NOTE — Telephone Encounter (Signed)
Patient returns call to nurse line stating that he was returning a missed call. Unable to find documentation of phone call attempt. Will forward to PCP and pharmacy team. It appears that patient has been working with pharmacy regarding medication assistance program.   Please advise if you were reaching out to patient.   Talbot Grumbling, RN

## 2020-08-29 NOTE — Telephone Encounter (Signed)
Returned pt call, patient assistance medication Anthony [name] is ready for pickup & he will be by before lunch to get it. Thanks!

## 2020-08-29 NOTE — Telephone Encounter (Signed)
Called pt & let him know Tyler Aas (2 boxes) was delivered to the clinic and is ready for pickup. Novofine Pentips are also with the order. Pt said he will pickup before lunch today.

## 2020-09-02 DIAGNOSIS — E1165 Type 2 diabetes mellitus with hyperglycemia: Secondary | ICD-10-CM | POA: Diagnosis not present

## 2020-09-22 ENCOUNTER — Ambulatory Visit (INDEPENDENT_AMBULATORY_CARE_PROVIDER_SITE_OTHER): Payer: HMO | Admitting: Family Medicine

## 2020-09-22 ENCOUNTER — Other Ambulatory Visit: Payer: Self-pay

## 2020-09-22 ENCOUNTER — Encounter: Payer: Self-pay | Admitting: Family Medicine

## 2020-09-22 VITALS — BP 124/70 | HR 87 | Ht 72.0 in | Wt 235.2 lb

## 2020-09-22 DIAGNOSIS — E119 Type 2 diabetes mellitus without complications: Secondary | ICD-10-CM

## 2020-09-22 DIAGNOSIS — Z794 Long term (current) use of insulin: Secondary | ICD-10-CM | POA: Diagnosis not present

## 2020-09-22 LAB — POCT GLYCOSYLATED HEMOGLOBIN (HGB A1C): HbA1c, POC (controlled diabetic range): 8.6 % — AB (ref 0.0–7.0)

## 2020-09-22 NOTE — Progress Notes (Signed)
SUBJECTIVE:   CHIEF COMPLAINT / HPI:   Depression  Seen with Dr. Jeannine Kitten on 2/28. Patient is currently taking 30 mg and would like to stay at this dose. He reports that he is also working on himself, which has helped improve his symptoms. PHQ 9 is 0 today.   DM Type II  Current medication regimen: Tresiba 42 units R154, Trulicity 1.5 mg weekly. Jardiance 10 mg patient dsicontinued due to causing sluggishness (due to nocturia) which , but did restart it after his last appointment and is taking it every other day. He reports that he is now not getting up 5x a night to urinate, which is making him feel more rested. He reports excellent compliance with treatment. He is not having side effects.   Meal times:  [name] around [place]. First meal is 10-11am. Snack around 2 pm. Dinner around [employer] (biggest meal). Snakc around 9 pm. He started the snack around 9 pm because his AM sugars were getting low, in the 50's. He did note that he felt dizzy with some blurred vision with standing up and he would check his sugar and was in the 50's. This has improved with a 9pm snack.  ROS:no polyuria or polydipsia, no chest pain, dyspnea or TIA's, no numbness, tingling or pain in extremities, no medication side effects noted Diabetic complications include: retinopathy, neuropathy, nephropathy     PERTINENT  PMH / PSH: NICM, CAD s/p DES 2003, Anemia, CKD IV, HTN, 2' hyperparathyroidism, A fib on (hx of ablation), ICD, OSA on CPAP, tobacco abuse, HLD, diabetic retinopathy, chronic back pain  OBJECTIVE:   BP 124/70   Pulse 87   Ht 6' (1.829 m)   Wt 235 lb 3.2 oz (106.7 kg)   SpO2 98%   BMI 31.90 kg/m   Well appearing male, NAD.   ASSESSMENT/PLAN:   Depression We will continue to address this at each visit to see if patient is ready to taper to next dose. He does note that it may take him a while to adjust to each lowered dose.    Insulin dependent type 2 diabetes mellitus (H[facility]) A1C today 8.6  (8.9) Weight trend: .  Wt Readings from Last 3 Encounters:  09/22/20 235 lb 3.2 oz (106.7 kg)  07/24/20 234 lb 9.6 oz (106.4 kg)  07/17/20 233 lb (105.7 kg)     Most Recent Eye Exam: 07/11/2020, positive for retinopathy Last Diabetic Foot Check: 09/24/19, performed at next visit  Overall, borderline controlled, needs further observation and no significant medication side effects noted. Reviewing patient's CGM today, it appears that patient is having lows overnight, sometimes into the 50s.  Patient does report that this improved with a 9 PM snack.  He seems to peak around mealtimes at 12 and 6:00 with sugars up to 300+.  Will reach out to Dr. Valentina Lucks [facility]) to discuss further possible options for diabetic control.  He previously was on NovoLog, but I believe this was discontinued given concerns for confusion of mealtime doses.  If we did add a short acting, I would want him to be very closely monitored.   He can continue his Jardiance every other day. I provided patient strict return precautions about episodes of hypoglycemia, which it seems he was having prior to starting his 9 PM snacks.  -Medications: Jardiance 10 mg every other day, Tresiba M086 42 units, Trulicity 1.5 mg weekly.   -Encouraged compliance with medications, and adherence to diet and exercise recommendations. -Follow-up in  q3 months and bring medications to next appointment. -We will call patient early next week for instructions for follow-up after speaking to [place], MD [place]

## 2020-09-22 NOTE — Assessment & Plan Note (Signed)
We will continue to address this at each visit to see if patient is ready to taper to next dose. He does note that it may take him a while to adjust to each lowered dose.

## 2020-09-22 NOTE — Patient Instructions (Signed)
No changes to your medications today. I am going to talk with our pharmacy colleagues to come up with a plan for your insulin. We will call you early next week.  If you experience hypoglycemia symptoms more often, you must call us.

## 2020-09-22 NOTE — Assessment & Plan Note (Addendum)
A1C today 8.6 (8.9) Weight trend: .  Wt Readings from Last 3 Encounters:  09/22/20 235 lb 3.2 oz (106.7 kg)  07/24/20 234 lb 9.6 oz (106.4 kg)  07/17/20 233 lb (105.7 kg)     Most Recent Eye Exam: 07/11/2020, positive for retinopathy Last Diabetic Foot Check: 09/24/19, performed at next visit  Overall, borderline controlled, needs further observation and no significant medication side effects noted. Reviewing patient's CGM today, it appears that patient is having lows overnight, sometimes into the 27s.  Patient does report that this improved with a 9 PM snack.  He seems to peak around mealtimes at 12 and 6:00 with sugars up to 300+.  Will reach out to Dr. Valentina Lucks [facility] on [facility]) to discuss further possible options for diabetic control.  He previously was on NovoLog, but I believe this was discontinued given concerns for confusion of mealtime doses.  If we did add a short acting, I would want him to be very closely monitored.   He can continue his Jardiance every other day. I provided patient strict return precautions about episodes of hypoglycemia, which it seems he was having prior to starting his 9 PM snacks.  -Medications: Jardiance 10 mg every other day, Tresiba [id number] 42 units, Trulicity 1.5 mg weekly.   -Encouraged compliance with medications, and adherence to diet and exercise recommendations. -Follow-up in q3 months and bring medications to next appointment. -We will call patient early next week for instructions for follow-up after speaking to pharmacy

## 2020-09-27 ENCOUNTER — Telehealth: Payer: Self-pay | Admitting: Pharmacist

## 2020-09-27 DIAGNOSIS — Z794 Long term (current) use of insulin: Secondary | ICD-10-CM

## 2020-09-27 DIAGNOSIS — E119 Type 2 diabetes mellitus without complications: Secondary | ICD-10-CM

## 2020-09-27 MED ORDER — NOVOLOG FLEXPEN 100 UNIT/ML ~~LOC~~ SOPN: 10.0000 [IU] | PEN_INJECTOR | Freq: Every day | SUBCUTANEOUS | 11 refills | Status: DC

## 2020-09-27 NOTE — Telephone Encounter (Signed)
-----   Message from Anthony Oliphant, MD sent at 09/22/2020  7:20 PM EDT ----- Hi Dr. Valentina [name],  I have some concerns about [name]'s DM medications. I wanted to talk through this patient's treatment plan and options for his high peaks with meal time. Previously on meal time insulin, but I believe he was taken off due to concern for confusion. I am not in the office again until next Wednesday, but please give me a call to discuss any time! Thank you!   Anthony [name]

## 2020-09-27 NOTE — Assessment & Plan Note (Signed)
Following discussion with PCP, Dr. [name], I agreed to contact patient to discuss basal PLUS 1 regimen.   Patient reports taking long-acting Tresiba insulin 42 units daily.  Contacted patient and discussed the addition of a short acting insulin.  Educated him to take this once daily with her largest meal (dinner).   New prescription sent for Novolog 10 units once daily prior to dinner.    Follow-up by phone in 1-2 weeks by PCP or pharmacy team.

## 2020-09-27 NOTE — Telephone Encounter (Signed)
Noted and agree. 

## 2020-09-27 NOTE — Telephone Encounter (Signed)
Following discussion with PCP, Dr. [name], I agreed to contact patient to discuss basal PLUS 1 regimen.   Patient reports taking long-acting Tresiba insulin 42 units daily.  Contacted patient and discussed the addition of a short acting insulin.  Educated him to take this once daily with her largest meal (dinner).   New prescription sent for Novolog 10 units once daily prior to dinner.    Follow-up by phone in 1-2 weeks by PCP or pharmacy team.

## 2020-10-02 DIAGNOSIS — E1165 Type 2 diabetes mellitus with hyperglycemia: Secondary | ICD-10-CM | POA: Diagnosis not present

## 2020-10-03 DIAGNOSIS — N2581 Secondary hyperparathyroidism of renal origin: Secondary | ICD-10-CM | POA: Diagnosis not present

## 2020-10-03 DIAGNOSIS — N184 Chronic kidney disease, stage 4 (severe): Secondary | ICD-10-CM | POA: Diagnosis not present

## 2020-10-03 DIAGNOSIS — I129 Hypertensive chronic kidney disease with stage 1 through stage 4 chronic kidney disease, or unspecified chronic kidney disease: Secondary | ICD-10-CM | POA: Diagnosis not present

## 2020-10-03 DIAGNOSIS — D631 Anemia in chronic kidney disease: Secondary | ICD-10-CM | POA: Diagnosis not present

## 2020-10-03 DIAGNOSIS — E1122 Type 2 diabetes mellitus with diabetic chronic kidney disease: Secondary | ICD-10-CM | POA: Diagnosis not present

## 2020-10-04 ENCOUNTER — Encounter: Payer: Self-pay | Admitting: Podiatry

## 2020-10-04 ENCOUNTER — Ambulatory Visit (INDEPENDENT_AMBULATORY_CARE_PROVIDER_SITE_OTHER): Payer: HMO | Admitting: Podiatry

## 2020-10-04 ENCOUNTER — Other Ambulatory Visit: Payer: Self-pay

## 2020-10-04 DIAGNOSIS — Q828 Other specified congenital malformations of skin: Secondary | ICD-10-CM | POA: Diagnosis not present

## 2020-10-04 DIAGNOSIS — D689 Coagulation defect, unspecified: Secondary | ICD-10-CM | POA: Diagnosis not present

## 2020-10-04 DIAGNOSIS — E119 Type 2 diabetes mellitus without complications: Secondary | ICD-10-CM | POA: Diagnosis not present

## 2020-10-04 DIAGNOSIS — B351 Tinea unguium: Secondary | ICD-10-CM | POA: Diagnosis not present

## 2020-10-04 DIAGNOSIS — M79609 Pain in unspecified limb: Secondary | ICD-10-CM | POA: Diagnosis not present

## 2020-10-04 NOTE — Progress Notes (Signed)
This patient returns to my office for at risk foot care.  This patient requires this care by a professional since this patient will be at risk due to having venous insufficiency, chronic kidney disease stage IV diabetes and coagulation defect.  Patient is taking eliquiss.  This patient is unable to cut nails himself since the patient cannot reach his nails.These nails are painful walking and wearing shoes.  This patient presents for at risk foot care today.  General Appearance  Alert, conversant and in no acute stress.  Vascular  Dorsalis pedis and posterior tibial  pulses are palpable  bilaterally.  Capillary return is within normal limits  bilaterally. Temperature is within normal limits  bilaterally.  Neurologic  Senn-Weinstein monofilament wire test within normal limits  bilaterally. Muscle power within normal limits bilaterally.  Nails Thick disfigured discolored nails with subungual debris  from hallux to fifth toes bilaterally. No evidence of bacterial infection or drainage bilaterally.  Orthopedic  No limitations of motion  feet .  No crepitus or effusions noted.  No bony pathology or digital deformities noted.  Amputation second toe left foot.  Skin  normotropic skin with no porokeratosis noted bilaterally.  No signs of infections or ulcers noted.  Porokeratosis sub 4 left and sub 5th right   Onychomycosis  Pain in right toes  Pain in left toes  Consent was obtained for treatment procedures.   Mechanical debridement of nails 1-5  Right and 1,3-5 left foot. performed with a nail nipper.  Filed with dremel without incident. Debridement of the porokeratosis  B/L with # 15 blade.   Return office visit   10 weeks                   Told patient to return for periodic foot care and evaluation due to potential at risk complications.   Gardiner Barefoot DPM

## 2020-10-18 ENCOUNTER — Telehealth: Payer: Self-pay | Admitting: Podiatry

## 2020-10-18 DIAGNOSIS — G4733 Obstructive sleep apnea (adult) (pediatric): Secondary | ICD-10-CM | POA: Diagnosis not present

## 2020-10-18 NOTE — Telephone Encounter (Signed)
Pt left message checking to see if diabetic shoes were in..   I returned call and left message that they are not in that the company we use is a little behind due to moving the warehouses but they should be shipping soon and if I hear anything differently I would give him a call. I did tell pt I was out of the office until next Tuesday.

## 2020-11-01 DIAGNOSIS — E1165 Type 2 diabetes mellitus with hyperglycemia: Secondary | ICD-10-CM | POA: Diagnosis not present

## 2020-11-06 ENCOUNTER — Other Ambulatory Visit: Payer: Self-pay | Admitting: Family Medicine

## 2020-11-06 ENCOUNTER — Other Ambulatory Visit: Payer: Self-pay

## 2020-11-06 ENCOUNTER — Telehealth: Payer: Self-pay | Admitting: Podiatry

## 2020-11-06 DIAGNOSIS — R0609 Other forms of dyspnea: Secondary | ICD-10-CM

## 2020-11-06 DIAGNOSIS — R931 Abnormal findings on diagnostic imaging of heart and coronary circulation: Secondary | ICD-10-CM

## 2020-11-06 DIAGNOSIS — Z95810 Presence of automatic (implantable) cardiac defibrillator: Secondary | ICD-10-CM

## 2020-11-06 MED ORDER — CARVEDILOL 25 MG PO TABS: 25.0000 mg | ORAL_TABLET | Freq: Two times a day (BID) | ORAL | 0 refills | Status: DC

## 2020-11-06 NOTE — Telephone Encounter (Signed)
Diabetic shoes and inserts in.. lvm for pt to call to schedule an appt to pick them up. 

## 2020-11-07 ENCOUNTER — Ambulatory Visit (INDEPENDENT_AMBULATORY_CARE_PROVIDER_SITE_OTHER): Payer: HMO

## 2020-11-07 DIAGNOSIS — I428 Other cardiomyopathies: Secondary | ICD-10-CM | POA: Diagnosis not present

## 2020-11-13 LAB — CUP PACEART REMOTE DEVICE CHECK
Battery Remaining Longevity: 16 mo
Battery Voltage: 2.91 V
Brady Statistic AP VP Percent: 0 %
Brady Statistic AP VS Percent: 0 %
Brady Statistic AS VP Percent: 0 %
Brady Statistic AS VS Percent: 0 %
Brady Statistic RA Percent Paced: 0 %
Brady Statistic RV Percent Paced: 99.52 %
Date Time Interrogation Session: 20220618181502
HighPow Impedance: 73 Ohm
Implantable Lead Implant Date: 20190301
Implantable Lead Implant Date: 20190301
Implantable Lead Location: 753858
Implantable Lead Location: 753860
Implantable Pulse Generator Implant Date: 20190301
Lead Channel Impedance Value: 1235 Ohm
Lead Channel Impedance Value: 1235 Ohm
Lead Channel Impedance Value: 1254 Ohm
Lead Channel Impedance Value: 1254 Ohm
Lead Channel Impedance Value: 1254 Ohm
Lead Channel Impedance Value: 1254 Ohm
Lead Channel Impedance Value: 332.5 Ohm
Lead Channel Impedance Value: 346.164
Lead Channel Impedance Value: 346.164
Lead Channel Impedance Value: 354.667
Lead Channel Impedance Value: 354.667
Lead Channel Impedance Value: 4047 Ohm
Lead Channel Impedance Value: 456 Ohm
Lead Channel Impedance Value: 532 Ohm
Lead Channel Impedance Value: 665 Ohm
Lead Channel Impedance Value: 665 Ohm
Lead Channel Impedance Value: 722 Ohm
Lead Channel Impedance Value: 760 Ohm
Lead Channel Pacing Threshold Amplitude: 0.5 V
Lead Channel Pacing Threshold Amplitude: 3.5 V
Lead Channel Pacing Threshold Pulse Width: 0.4 ms
Lead Channel Pacing Threshold Pulse Width: 1.5 ms
Lead Channel Sensing Intrinsic Amplitude: 13.5 mV
Lead Channel Sensing Intrinsic Amplitude: 13.5 mV
Lead Channel Setting Pacing Amplitude: 2.5 V
Lead Channel Setting Pacing Amplitude: 3.5 V
Lead Channel Setting Pacing Pulse Width: 0.4 ms
Lead Channel Setting Pacing Pulse Width: 1.5 ms
Lead Channel Setting Sensing Sensitivity: 0.3 mV

## 2020-11-16 ENCOUNTER — Other Ambulatory Visit: Payer: Self-pay

## 2020-11-16 ENCOUNTER — Encounter: Payer: Self-pay | Admitting: Family Medicine

## 2020-11-16 ENCOUNTER — Ambulatory Visit (INDEPENDENT_AMBULATORY_CARE_PROVIDER_SITE_OTHER): Payer: HMO | Admitting: Family Medicine

## 2020-11-16 VITALS — BP 124/68 | HR 88 | Ht 72.0 in | Wt 230.0 lb

## 2020-11-16 DIAGNOSIS — E785 Hyperlipidemia, unspecified: Secondary | ICD-10-CM

## 2020-11-16 DIAGNOSIS — E1169 Type 2 diabetes mellitus with other specified complication: Secondary | ICD-10-CM | POA: Diagnosis not present

## 2020-11-16 DIAGNOSIS — R221 Localized swelling, mass and lump, neck: Secondary | ICD-10-CM | POA: Diagnosis not present

## 2020-11-16 NOTE — Assessment & Plan Note (Signed)
Unclear etiology.  It is reassuring that the mass has improved in size.  Patient is a longtime smoker which is concerning for a malignancy, however, history of the nodule sounds more consistent with possible lymph node enlargement.  Less likely sialadenitis.  Offered option of imaging versus watching given quick improvement.  Patient would like to continue to watch.  We discussed that this is unlikely due to the Trulicity.  He is agreeable to restart the Trulicity at this time.  If he has recurrence of symptoms with 3 starting Trulicity, he should stop and follow-up with [place] immediately.  If he does have recurrence with or without Trulicity, he should follow-up with [place] for imaging.

## 2020-11-16 NOTE — Patient Instructions (Signed)
Dear Anthony [name],   Today we discussed the following:   Lump in the neck   Go back onto the Trulicity  If you have any swelling or recurrence in the neck, please call us right away   For any labs obtained today, I will send results via MyChart.  If anything is abnormal, we will reach out to you for details on further management.   Be well,   Dr. [name]  =================================================================================== Some important items for Rickard's health:   Please schedule an appointment with Dr. [name], [name], MD to address the following or for any questions or concerns regarding the information below.   To keep you healthy, you are due for the following Health Maintenance Items:  Health Maintenance Due  Topic Date Due   Zoster Vaccines- Shingrix (1 of 2) Never done   COVID-19 Vaccine (4 - Booster for Pfizer series) 07/25/2020   FOOT EXAM  09/23/2020   COLON CANCER SCREENING ANNUAL FOBT  10/11/2020

## 2020-11-16 NOTE — Progress Notes (Signed)
   SUBJECTIVE:  CHIEF COMPLAINT / HPI:   Lump in neck Patient presenting after feeling a knot in his neck that started about 3 weeks ago.  He denied any pain but does report that it felt like there was some crowding in his neck.  Patient felt like the mass was inside of his neck but could feel an elevation on the outside of his neck.  Patient denies any fevers, chills or upper respiratory symptoms.  He does note that he had some trouble swallowing but was able to eat and swallow food without any issue.  He did not have any choking or other dysphagia.  He is not sure if the mass was hard or soft.  He does note that it is decreased in size greatly. He had some discomfort moving his neck towards the left and downward.  Patient denies any dry mouth or pain with salivation at the time.  PERTINENT  PMH / PSH: NICM, CAD s/p DES 2003, Anemia, CKD IV, HTN, 2' hyperparathyroidism, A fib on (hx of ablation), ICD, OSA on CPAP, tobacco abuse, HLD, diabetic retinopathy, chronic back pain    OBJECTIVE:  BP 124/68   Pulse 88   Ht 6' (1.829 m)   Wt 230 lb (104.3 kg)   SpO2 96%   BMI 31.19 kg/m   General: well appearing, NAD  Neck: 2-3 cm soft, smooth, mobile mass palpated in submandibular space on left side. No TTP. No oral lesions appreciated.   ASSESSMENT/PLAN:  Neck mass Unclear etiology.  It is reassuring that the mass has improved in size.  Patient is a longtime smoker which is concerning for a malignancy, however, history of the nodule sounds more consistent with possible lymph node enlargement.  Less likely sialadenitis.  Offered option of imaging versus watching given quick improvement.  Patient would like to continue to watch.  We discussed that this is unlikely due to the Trulicity.  He is agreeable to restart the Trulicity at this time.  If he has recurrence of symptoms with 3 starting Trulicity, he should stop and follow-up with [place] immediately.  If he does have recurrence with or without Trulicity,  he should follow-up with [place] for imaging.    [name], MD [place]

## 2020-11-17 ENCOUNTER — Encounter: Payer: Self-pay | Admitting: Family Medicine

## 2020-11-17 LAB — LIPID PANEL
Chol/HDL Ratio: 4 ratio (ref 0.0–5.0)
Cholesterol, Total: 139 mg/dL (ref 100–199)
HDL: 35 mg/dL — ABNORMAL LOW (ref >39)
LDL Chol Calc (NIH): 86 mg/dL (ref 0–99)
Triglycerides: 92 mg/dL (ref 0–149)
VLDL Cholesterol Cal: 18 mg/dL (ref 5–40)

## 2020-11-20 ENCOUNTER — Other Ambulatory Visit: Payer: Self-pay

## 2020-11-20 ENCOUNTER — Ambulatory Visit (INDEPENDENT_AMBULATORY_CARE_PROVIDER_SITE_OTHER): Payer: HMO | Admitting: *Deleted

## 2020-11-20 DIAGNOSIS — M2010 Hallux valgus (acquired), unspecified foot: Secondary | ICD-10-CM

## 2020-11-20 DIAGNOSIS — E119 Type 2 diabetes mellitus without complications: Secondary | ICD-10-CM | POA: Diagnosis not present

## 2020-11-20 DIAGNOSIS — M2012 Hallux valgus (acquired), left foot: Secondary | ICD-10-CM

## 2020-11-20 DIAGNOSIS — M2011 Hallux valgus (acquired), right foot: Secondary | ICD-10-CM

## 2020-11-20 NOTE — Progress Notes (Signed)
Patient presents today to pick up diabetic shoes and insoles.  Patient was dispensed 1 pair of diabetic shoes and 3 pairs of foam casted diabetic insoles. Fit was satisfactory. Instructions for break-in and wear was reviewed and a copy was given to the patient.   Re-appointment for regularly scheduled diabetic foot care visits or if they should experience any trouble with the shoes or insoles.  

## 2020-11-28 ENCOUNTER — Other Ambulatory Visit: Payer: Self-pay | Admitting: Family Medicine

## 2020-11-29 NOTE — Progress Notes (Signed)
Remote ICD transmission.   

## 2020-12-07 ENCOUNTER — Telehealth: Payer: Self-pay | Admitting: Cardiology

## 2020-12-07 NOTE — Telephone Encounter (Signed)
Hey Lynn, LPN, can you please advise on this matter? Thanks  ?

## 2020-12-07 NOTE — Telephone Encounter (Signed)
Patient calling the office for samples of medication:   1.  What medication and dosage are you requesting samples for? empagliflozin (JARDIANCE) 10 MG TABS tablet  2.  Are you currently out of this medication?  Patient states he has 1 tablet remaining.

## 2020-12-08 NOTE — Telephone Encounter (Signed)
Yes ma'am, I will leave samples at the front desk for pt to pick up. Thanks

## 2020-12-08 NOTE — Telephone Encounter (Signed)
**Note De-Identified Karthik Whittinghill Obfuscation** The pt and I discussed him applying for pt asst for his Jardiance through [employer] and he is interested so I gave him their phone number and advised him to call them with questions about their Jardiance program and his eligibility to be approved or the program. He is aware to request that they mail him an application and that once he receives it to complete his part, obtain any documents required per [facility], and to bring all to Dr Holland Commons office a [place] on [address] in [place] to drop off in the front office and that we will take care of the provider page and will fax all to [facility].  He is also aware that we are leaving him a 2 week supply of Jardiance 10 mg samples in the front office for hi to pick up today.  He thanked me for our assistance.

## 2020-12-14 ENCOUNTER — Other Ambulatory Visit: Payer: Self-pay | Admitting: Family Medicine

## 2020-12-14 ENCOUNTER — Telehealth: Payer: Self-pay

## 2020-12-14 NOTE — Telephone Encounter (Signed)
Received phone call from representative at [employer] regarding rx for patient's CGM.   Rep states that they need an updated rx faxed with recent clinical notes to [phone].  Will forward to Dr. Valentina Lucks as he ordered last CGM supplies.   Talbot Grumbling, RN

## 2020-12-18 ENCOUNTER — Encounter: Payer: Self-pay | Admitting: Podiatry

## 2020-12-18 ENCOUNTER — Other Ambulatory Visit: Payer: Self-pay

## 2020-12-18 ENCOUNTER — Ambulatory Visit (INDEPENDENT_AMBULATORY_CARE_PROVIDER_SITE_OTHER): Payer: HMO | Admitting: Podiatry

## 2020-12-18 DIAGNOSIS — D689 Coagulation defect, unspecified: Secondary | ICD-10-CM

## 2020-12-18 DIAGNOSIS — Q828 Other specified congenital malformations of skin: Secondary | ICD-10-CM

## 2020-12-18 DIAGNOSIS — M79609 Pain in unspecified limb: Secondary | ICD-10-CM | POA: Diagnosis not present

## 2020-12-18 DIAGNOSIS — B351 Tinea unguium: Secondary | ICD-10-CM

## 2020-12-18 DIAGNOSIS — E119 Type 2 diabetes mellitus without complications: Secondary | ICD-10-CM

## 2020-12-18 NOTE — Progress Notes (Signed)
This patient returns to my office for at risk foot care.  This patient requires this care by a professional since this patient will be at risk due to having venous insufficiency, chronic kidney disease stage IV diabetes and coagulation defect.  Patient is taking eliquiss.  This patient is unable to cut nails himself since the patient cannot reach his nails.These nails are painful walking and wearing shoes.  This patient presents for at risk foot care today.  General Appearance  Alert, conversant and in no acute stress.  Vascular  Dorsalis pedis and posterior tibial  pulses are palpable  bilaterally.  Capillary return is within normal limits  bilaterally. Temperature is within normal limits  bilaterally.  Neurologic  Senn-Weinstein monofilament wire test within normal limits  bilaterally. Muscle power within normal limits bilaterally.  Nails Thick disfigured discolored nails with subungual debris  from hallux to fifth toes bilaterally. No evidence of bacterial infection or drainage bilaterally.  Orthopedic  No limitations of motion  feet .  No crepitus or effusions noted.  No bony pathology or digital deformities noted.  Amputation second toe left foot.  Skin  normotropic skin with no porokeratosis noted bilaterally.  No signs of infections or ulcers noted.  Porokeratosis sub 4 left and sub 5th right   Onychomycosis  Pain in right toes  Pain in left toes  Consent was obtained for treatment procedures.   Mechanical debridement of nails 1-5  Right and 1,3-5 left foot. performed with a nail nipper.  Filed with dremel without incident. Debridement of the porokeratosis  B/L with # 15 blade.   Return office visit   10 weeks                   Told patient to return for periodic foot care and evaluation due to potential at risk complications.   Gardiner Barefoot DPM

## 2020-12-19 NOTE — Telephone Encounter (Signed)
[employer] returns phone call today regarding need for updated prescription and updated office notes.   Will forward to pharmacy team as they have been assisting patient with CGM supplies in the past.   Talbot Grumbling, RN

## 2020-12-20 ENCOUNTER — Other Ambulatory Visit: Payer: Self-pay | Admitting: Family Medicine

## 2020-12-20 MED ORDER — FREESTYLE LIBRE 14 DAY SENSOR MISC: 1.0000 | 11 refills | Status: DC

## 2020-12-20 NOTE — Telephone Encounter (Signed)
New [employer] prescription needed for CGM supply.   Sent new prescription to local pharmacy.    Hortencia Pilar, CPhT will handle submission of recent clinical notes.

## 2020-12-27 ENCOUNTER — Ambulatory Visit (INDEPENDENT_AMBULATORY_CARE_PROVIDER_SITE_OTHER): Payer: HMO

## 2020-12-27 ENCOUNTER — Ambulatory Visit (INDEPENDENT_AMBULATORY_CARE_PROVIDER_SITE_OTHER): Payer: HMO | Admitting: Family Medicine

## 2020-12-27 ENCOUNTER — Other Ambulatory Visit: Payer: Self-pay

## 2020-12-27 ENCOUNTER — Encounter: Payer: Self-pay | Admitting: Family Medicine

## 2020-12-27 VITALS — BP 133/94 | HR 74 | Ht 72.0 in | Wt 234.4 lb

## 2020-12-27 DIAGNOSIS — Z794 Long term (current) use of insulin: Secondary | ICD-10-CM

## 2020-12-27 DIAGNOSIS — I10 Essential (primary) hypertension: Secondary | ICD-10-CM | POA: Diagnosis not present

## 2020-12-27 DIAGNOSIS — E119 Type 2 diabetes mellitus without complications: Secondary | ICD-10-CM | POA: Diagnosis not present

## 2020-12-27 DIAGNOSIS — E1169 Type 2 diabetes mellitus with other specified complication: Secondary | ICD-10-CM | POA: Diagnosis not present

## 2020-12-27 DIAGNOSIS — Z23 Encounter for immunization: Secondary | ICD-10-CM

## 2020-12-27 DIAGNOSIS — E785 Hyperlipidemia, unspecified: Secondary | ICD-10-CM

## 2020-12-27 LAB — POCT GLYCOSYLATED HEMOGLOBIN (HGB A1C): HbA1c, POC (controlled diabetic range): 9.1 % — AB (ref 0.0–7.0)

## 2020-12-27 MED ORDER — EMPAGLIFLOZIN 10 MG PO TABS
10.0000 mg | ORAL_TABLET | Freq: Every day | ORAL | 0 refills | Status: DC
Start: 2020-12-27 — End: 2021-01-04

## 2020-12-27 NOTE — Patient Instructions (Signed)
Thank you for coming to see me today. It was a pleasure. Today we discussed  your diabetes. It is not very well controlled unfortunately and it has got worse since we tested her last.  I recommend: -Increasing Tresiba to 48 units daily -Adding NovoLog 10 units at dinnertime in addition to lunchtime -Eating dinner later in the day i.e. 6-7pm -We have provided you with Jardiance samples -Try this regimen for the next 1 week and keep monitoring your blood sugars -If you have any hypos please call and let us know   We will get some labs today.  If they are abnormal or we need to do something about them, I will call you.  If they are normal, I will send you a message on MyChart (if it is active) or a letter in the mail.  If you don't hear from [place] in 2 weeks, please call the office at the number below.   Please follow-up with Dr. Valentina Lucks in 1 week's time and he will help titrate your diabetic medication  If you have any questions or concerns, please do not hesitate to call the office at [phone].  Best wishes,   Dr Posey Pronto

## 2020-12-27 NOTE — Assessment & Plan Note (Addendum)
A1c 9.1 today, worsening from 8.6 in April this year. Pt is struggling with both high and low blood sugar levels.  Recommended pt increases Tresiba to 48units and adding novolog 10 U to evening meals in addition to lunch time.  Patient would like to try this regimen for 1 week before he sees Dr. Valentina Lucks next week. Also provided Jardiance 10mg  samples from the clinic today.  Also recommended eating dinner a little later to avoid hypoglycemic episodes.  Obtained BMP today. Follow-up next week with Dr. Valentina Lucks on 01/04/21 for further dose titration.

## 2020-12-27 NOTE — Progress Notes (Addendum)
     SUBJECTIVE:   CHIEF COMPLAINT / HPI:   Anthony [name] is a 70 y.o. male presents for diabetic check    Diabetes Patient's current diabetic medications include jardiance  36m, trulicity and tresiba 42 units. Takes novolog PRN with after meals. Tolerating well without side effects.  Patient endorses compliance with these medications. CBG readings averaging in the 83-300s range. Average glucose in 177 over the last 7 days on CGM. Sometimes eats breakfast (9am) has heavier lunch (1-2pm) and dinner (4-5pm) meals. When he has hypos he has a sugary drink.  Experiences hypos infrequently between breakfast and lunch. Patient's last A1c was  Lab Results  Component Value Date   HGBA1C 9.1 (A) 12/27/2020   HGBA1C 8.6 (A) 09/22/2020   HGBA1C 8.9 (A) 05/22/2020   Denies abdominal pain, blurred vision, polyuria, polydipsia, hypoglycemia. Patient states they understand that diet and exercise can help with her diabetes. Requesting jardiance samples as he is running out.   Last Microalbumin, LDL, Creatinine: Lab Results  Component Value Date   MICROALBUR 150 10/29/2017   Basile 86 11/16/2020   CREATININE 2.85 (H) 12/27/2020     Hypertension: Patient's current antihypertensive  medications include: Losartan and coreg.Compliant with medications and tolerating well without side effects. Denies any SOB, CP, vision changes, LE edema, medication SEs, or symptoms of hypotension.   Most recent creatinine trend:  Lab Results  Component Value Date   CREATININE 2.85 (H) 12/27/2020   CREATININE 3.24 (H) 06/07/2020   CREATININE 3.29 (H) 05/31/2020     Patient has not had a BMP in the past 1 year.   [place] Office Visit from 12/27/2020 in [place]  PHQ-9 Total Score 0       Pt will also get second COVID booster today.   PERTINENT  PMH / PSH: Diabetes, HTN, CKD, depression   OBJECTIVE:   BP (!) 133/94   Pulse 74   Ht 6' (1.829 m)   Wt 234 lb 6.4 oz (106.3  kg)   SpO2 100%   BMI 31.79 kg/m    General: Alert, no acute distress Cardio: well perfused  Pulm:  normal work of breathing  ASSESSMENT/PLAN:   Insulin dependent type 2 diabetes mellitus (HCC) A1c 9.1 today, worsening from 8.6 in April this year. Pt is struggling with both high and low blood sugar levels.  Recommended pt increases Tresiba to 48units and adding novolog 10 U to evening meals in addition to lunch time.  Patient would like to try this regimen for 1 week before he sees Dr. Valentina Lucks next week. Also provided Jardiance 10mg  samples from the clinic today.  Also recommended eating dinner a little later to avoid hypoglycemic episodes.  Obtained BMP today. Follow-up next week with Dr. Valentina Lucks on 01/04/21 for further dose titration.  COVID-19 vaccine administered COVID 19 booster administered today.   HYPERTENSION, BENIGN SYSTEMIC BP at goal. Continue Coreg and Losartan.      Lattie Haw, MD PGY-3 [place]

## 2020-12-27 NOTE — Assessment & Plan Note (Signed)
COVID-19 booster administered today

## 2020-12-28 LAB — BASIC METABOLIC PANEL
BUN/Creatinine Ratio: 6 — ABNORMAL LOW (ref 10–24)
BUN: 18 mg/dL (ref 8–27)
CO2: 24 mmol/L (ref 20–29)
Calcium: 9.8 mg/dL (ref 8.6–10.2)
Chloride: 103 mmol/L (ref 96–106)
Creatinine, Ser: 2.85 mg/dL — ABNORMAL HIGH (ref 0.76–1.27)
Glucose: 159 mg/dL — ABNORMAL HIGH (ref 65–99)
Potassium: 4.1 mmol/L (ref 3.5–5.2)
Sodium: 143 mmol/L (ref 134–144)
eGFR: 23 mL/min/{1.73_m2} — ABNORMAL LOW (ref >59)

## 2020-12-29 ENCOUNTER — Telehealth: Payer: Self-pay

## 2020-12-29 NOTE — Telephone Encounter (Signed)
Patient calls nurse line regarding itching at the site of COVID vaccine. Vaccine was administered on 8/3. Patient denies pain, swelling or redness.   Provided patient with supportive measures. Patient will call back if symptoms worsen or if he develops new symptoms.   FYI to PCP.   Talbot Grumbling, RN

## 2020-12-30 NOTE — Telephone Encounter (Signed)
Noted and agreed thank you Jarrett Soho.

## 2020-12-31 NOTE — Assessment & Plan Note (Signed)
BP at goal. Continue Coreg and Losartan.

## 2021-01-04 ENCOUNTER — Encounter: Payer: Self-pay | Admitting: Pharmacist

## 2021-01-04 ENCOUNTER — Ambulatory Visit (INDEPENDENT_AMBULATORY_CARE_PROVIDER_SITE_OTHER): Payer: HMO | Admitting: Pharmacist

## 2021-01-04 ENCOUNTER — Other Ambulatory Visit: Payer: Self-pay

## 2021-01-04 DIAGNOSIS — E119 Type 2 diabetes mellitus without complications: Secondary | ICD-10-CM

## 2021-01-04 DIAGNOSIS — Z794 Long term (current) use of insulin: Secondary | ICD-10-CM | POA: Diagnosis not present

## 2021-01-04 MED ORDER — TRESIBA FLEXTOUCH 200 UNIT/ML ~~LOC~~ SOPN: 32.0000 [IU] | PEN_INJECTOR | Freq: Every day | SUBCUTANEOUS | Status: DC

## 2021-01-04 MED ORDER — NOVOLOG FLEXPEN 100 UNIT/ML ~~LOC~~ SOPN: 12.0000 [IU] | PEN_INJECTOR | Freq: Two times a day (BID) | SUBCUTANEOUS | 11 refills | Status: DC

## 2021-01-04 NOTE — Assessment & Plan Note (Signed)
Diabetes currently uncontrolled, with most recent A1c on 12/27/20 of 9.1 (up from previous 8.6 in April). Patient is able to verbalize appropriate hypoglycemia management plan. Medication adherence appears appropriate, however, educated patient that he is supposed to take Novolog before meals for it to work appropriately rather than after. He is also experiencing multiple hypoglycemic events, but BG high throughout the day. Need to decrease basal insulin to prevent morning lows, but increase bolus insulin to help with highs from meals, in order to achieve closer to a 50/50 balance between basal/bolus.  -Decrease Tresiba (insulin degludec) to 32 units daily -Increase Novolog (insulin aspart) to 12 units before two largest meals -Instructed to call if any BG <70 mg/dL -Extensively discussed pathophysiology of diabetes, recommended lifestyle interventions, dietary effects on blood sugar control -Counseled on s/sx of and management of hypoglycemia -Next A1C anticipated 03/2021.

## 2021-01-04 NOTE — Progress Notes (Signed)
S:     Chief Complaint  Patient presents with   Medication Management    Diabetes Follow Up   Patient arrives in good spirits ambulating independently. Presents for diabetes evaluation, education, and management. Patient was referred and last seen by Primary Care Provider, Dr. Posey Pronto, on 12/27/20.   Today, patient reports taking Novolog 10 units after dinner. However he has only taken it about 4x in the last week when his BG before dinner was high. Reports some recent lows which happen when skips a snack/meal. Endorses "funny feeling" (dizzy, drowsy) when this occurs. Sometimes when he wakes up in the morning to go to the bathroom and checks BG which is low. He is asking for a referral to rehab to help with his balance.   Family/Social History:  -Tobacco: Former smoker  Human resources officer affordability: [employer]  Medication adherence reported.  Current diabetes medications include: Jardiance (empagliflozin) 10 mg daily, Trulicity (dulaglutide) 1.5 mg weekly, Tresiba (insulin degludec) 48 units daily, Novolog (insulin aspart) 10 units before dinner (not doing this daily, taking after dinner) Current hypertension medications include: losartan 25 mg daily, carvedilol 25 mg BID Current hyperlipidemia medications include: rosuvastatin 20 mg daily  Patient reports hypoglycemic events.  Patient reported dietary habits: Eats at least 2 meals/day with snacks in between which is usually peanut butter crackers  O:  Physical Exam Vitals reviewed.  Cardiovascular:     Rate and Rhythm: Normal rate.  Pulmonary:     Effort: Pulmonary effort is normal.  Neurological:     Mental Status: He is alert.  Psychiatric:        Mood and Affect: Mood normal.        Behavior: Behavior normal.        Thought Content: Thought content normal.   Review of Systems  All other systems reviewed and are negative.  Lab Results  Component Value Date   HGBA1C 9.1 (A) 12/27/2020    Vitals:   01/04/21 1341  BP: 134/76    Lipid Panel     Component Value Date/Time   CHOL 139 11/16/2020 0954   TRIG 92 11/16/2020 0954   HDL 35 (L) 11/16/2020 0954   CHOLHDL 4.0 11/16/2020 0954   CHOLHDL 3.2 02/28/2016 0906   VLDL 19 02/28/2016 0906   LDLCALC 86 11/16/2020 0954   LDLDIRECT 58 11/06/2011 1434    CGM data:  Date of Download: 01/04/21 % Time CGM is active: 96% Average Glucose: 154 mg/dL Glucose Management Indicator: 7.0%  Glucose Variability: 35.8% (goal <36%) Time in Goal:  - Time in range 70-180: 65% - Time above range: 30% - Time below range: 5% Observed patterns: Multiple low BG overnight and in the morning  Clinical Atherosclerotic Cardiovascular Disease (ASCVD): No  The 10-year ASCVD risk score Mikey Bussing DC Jr., et al., 2013) is: 34.5%   Values used to calculate the score:     Age: 70 years     Sex: Male     Is Non-Hispanic African American: Yes     Diabetic: Yes     Tobacco smoker: No     Systolic Blood Pressure: 474 mmHg     Is BP treated: Yes     HDL Cholesterol: 35 mg/dL     Total Cholesterol: 139 mg/dL   A/P: Diabetes currently uncontrolled, with most recent A1c on 12/27/20 of 9.1 (up from previous 8.6 in April). Patient is able to verbalize appropriate hypoglycemia management plan. Medication adherence appears appropriate, however, educated patient that  he is supposed to take Novolog before meals for it to work appropriately rather than after. He is also experiencing multiple hypoglycemic events, but BG high throughout the day. Need to decrease basal insulin to prevent morning lows, but increase bolus insulin to help with highs from meals, in order to achieve closer to a 50/50 balance between basal/bolus.  -Decrease Tresiba (insulin degludec) to 32 units daily -Increase Novolog (insulin aspart) to 12 units before two largest meals -Instructed to call if any BG <70 mg/dL -Extensively discussed pathophysiology of diabetes, recommended lifestyle  interventions, dietary effects on blood sugar control -Counseled on s/sx of and management of hypoglycemia -Next A1C anticipated 03/2021.   Follow up PCP Clinic Visit in 2-3 weeks. Instructed him to discuss referral to rehab with Dr. Posey Pronto at that time.   Written patient instructions provided.  Total time in face to face counseling 30 minutes.    Patient seen with Meyer Russel, PharmD Candidate, Joseph Art, PharmD - PGY-1, Rebbeca Paul, PharmD - PGY2 Pharmacy Resident.

## 2021-01-04 NOTE — Patient Instructions (Addendum)
It was nice to see you today!  Your goal blood sugar is 80-130 before eating and less than 180 after eating.  Medication Changes: DECREASE Tresiba to 32 units daily  INCREASE Novolog to 12 units BEFORE your two largest meals  Monitor blood sugars at home and keep a log (glucometer or piece of paper) to bring with you to your next visit.  Keep up the good work with diet and exercise. Aim for a diet full of vegetables, fruit and lean meats (chicken, [place], fish). Try to limit salt intake by eating fresh or frozen vegetables (instead of canned), rinse canned vegetables prior to cooking and do not add any additional salt to meals.   Schedule follow up with PCP in 2-3 weeks.   Call us if you see any blood sugars less than 70.

## 2021-01-05 NOTE — Progress Notes (Signed)
Reviewed: I agree with Dr. Koval's documentation and management. 

## 2021-01-09 ENCOUNTER — Telehealth: Payer: Self-pay | Admitting: Pharmacist

## 2021-01-09 NOTE — Telephone Encounter (Signed)
Noted and agree. 

## 2021-01-09 NOTE — Telephone Encounter (Signed)
Patient called to my office phone on 8/15.    I returned call in AM of 8/16.   Patient reports low blood glucose reading of 40 with recent insulin change.  He stated he did not think that two meal time shots per day was a good regimen for him.    He is currently taking 32 units of Tresiba and 12 units of Novolog prior to largest meal.   We discussed options and we agreed to continue without change to once daily Novolog regimen and discuss plans again in 2 days.   Patient verbalized understanding and agreement with treatment plan.

## 2021-01-09 NOTE — Telephone Encounter (Signed)
Noted and agreed thank you Dr Valentina Lucks.

## 2021-01-11 ENCOUNTER — Telehealth: Payer: Self-pay | Admitting: Pharmacist

## 2021-01-11 DIAGNOSIS — E119 Type 2 diabetes mellitus without complications: Secondary | ICD-10-CM

## 2021-01-11 DIAGNOSIS — Z794 Long term (current) use of insulin: Secondary | ICD-10-CM

## 2021-01-11 MED ORDER — NOVOLOG FLEXPEN 100 UNIT/ML ~~LOC~~ SOPN: 12.0000 [IU] | PEN_INJECTOR | Freq: Every day | SUBCUTANEOUS | Status: DC

## 2021-01-11 NOTE — Telephone Encounter (Signed)
Noted and agreed. Thank you Dr Valentina Lucks.

## 2021-01-11 NOTE — Telephone Encounter (Signed)
Patient contacted for follow/up of blood sugar control.  Since last contact patient reports CBGs of 163 102 121 126 124 130   Medications currently being used;  [place] (insulin degludec)  32 units once daily Novolog (insulin aspart) 12 units prior to largest meal Trulicity (dulaglutide) 1.5mg  weekly Jardiance (empagliflozin) 25mg  daily  Following discussion we agreed to continue same regimen.   Patient has plan to follow-up with Dr. Posey Pronto on 9/27 I asked him to call me with any additional concerning readings in the interim.   Total time with patient call and documentation of interaction: 14 minutes.

## 2021-01-11 NOTE — Telephone Encounter (Signed)
-----   Message from Leavy Cella, [place] sent at 01/09/2021 10:59 AM EDT ----- Regarding: Diabetes control follow-up

## 2021-01-11 NOTE — Telephone Encounter (Signed)
Noted and agree. 

## 2021-01-17 ENCOUNTER — Other Ambulatory Visit: Payer: Self-pay | Admitting: Family Medicine

## 2021-01-31 ENCOUNTER — Other Ambulatory Visit: Payer: Self-pay | Admitting: Family Medicine

## 2021-02-01 ENCOUNTER — Other Ambulatory Visit: Payer: Self-pay | Admitting: Family Medicine

## 2021-02-05 ENCOUNTER — Telehealth: Payer: Self-pay

## 2021-02-05 NOTE — Telephone Encounter (Signed)
Noted and agree. 

## 2021-02-05 NOTE — Telephone Encounter (Signed)
Patient calls nurse line requesting samples of jardiance 10 mg tablets. Patient states that he is unable to afford the medication from retail pharmacy.   Please advise.   Talbot Grumbling, RN

## 2021-02-05 NOTE — Telephone Encounter (Signed)
Patient called to clarify request for Jardiance.(Empagliflozin) 10mg .   Patient states that he cannot afford his Jardiance this month.    Reports his blood sugars have been under good control and he has had minimal low readings.   Medication Samples have been provided to the patient.  Drug name: Vania Rea (smpagliflozin0  Qty: 28  LOT: [id number], [id number]  Exp.Date: 10/25/2022, 05/26/2022  The patient has been instructed regarding the correct time, dose, and frequency of taking this medication, including desired effects and most common side effects.   Janeann Forehand 11:27 AM 02/05/2021  Patient may need help with LIS or potentially he may be in coverage gap status.  I will ask Hortencia Pilar, CPhT to investigate options.

## 2021-02-06 ENCOUNTER — Telehealth: Payer: Self-pay

## 2021-02-06 ENCOUNTER — Ambulatory Visit (INDEPENDENT_AMBULATORY_CARE_PROVIDER_SITE_OTHER): Payer: Medicare Other

## 2021-02-06 DIAGNOSIS — I428 Other cardiomyopathies: Secondary | ICD-10-CM | POA: Diagnosis not present

## 2021-02-06 LAB — CUP PACEART REMOTE DEVICE CHECK
Battery Remaining Longevity: 16 mo
Battery Voltage: 2.89 V
Brady Statistic AP VP Percent: 0 %
Brady Statistic AP VS Percent: 0 %
Brady Statistic AS VP Percent: 0 %
Brady Statistic AS VS Percent: 0 %
Brady Statistic RA Percent Paced: 0 %
Brady Statistic RV Percent Paced: 99.51 %
Date Time Interrogation Session: 20220913033624
HighPow Impedance: 73 Ohm
Implantable Lead Implant Date: 20190301
Implantable Lead Implant Date: 20190301
Implantable Lead Location: 753858
Implantable Lead Location: 753860
Implantable Pulse Generator Implant Date: 20190301
Lead Channel Impedance Value: 1197 Ohm
Lead Channel Impedance Value: 1197 Ohm
Lead Channel Impedance Value: 1197 Ohm
Lead Channel Impedance Value: 1197 Ohm
Lead Channel Impedance Value: 1235 Ohm
Lead Channel Impedance Value: 1235 Ohm
Lead Channel Impedance Value: 327.681
Lead Channel Impedance Value: 336.648
Lead Channel Impedance Value: 340.944
Lead Channel Impedance Value: 341.736
Lead Channel Impedance Value: 346.164
Lead Channel Impedance Value: 4047 Ohm
Lead Channel Impedance Value: 418 Ohm
Lead Channel Impedance Value: 513 Ohm
Lead Channel Impedance Value: 646 Ohm
Lead Channel Impedance Value: 665 Ohm
Lead Channel Impedance Value: 703 Ohm
Lead Channel Impedance Value: 722 Ohm
Lead Channel Pacing Threshold Amplitude: 0.5 V
Lead Channel Pacing Threshold Amplitude: 4.25 V
Lead Channel Pacing Threshold Pulse Width: 0.4 ms
Lead Channel Pacing Threshold Pulse Width: 1.5 ms
Lead Channel Sensing Intrinsic Amplitude: 8.875 mV
Lead Channel Sensing Intrinsic Amplitude: 8.875 mV
Lead Channel Setting Pacing Amplitude: 2.5 V
Lead Channel Setting Pacing Amplitude: 3.5 V
Lead Channel Setting Pacing Pulse Width: 0.4 ms
Lead Channel Setting Pacing Pulse Width: 1.5 ms
Lead Channel Setting Sensing Sensitivity: 0.3 mV

## 2021-02-06 NOTE — Telephone Encounter (Signed)
Scheduled remote reviewed. Normal device function.   LV pacing threshold (auto) at 4.25v > programmed output 3.5v/1.69ms.  Lead trends show thresholds > 3.5v majority of time since last remote transmission. Routing for further review   Known history of chronically elevated LV threshold with max 3.25V 08/12/2019. Confirmed with manual transmission sent today at 13:35 pm. Patient due for recall with Dr. Caryl Comes 03/21/21.

## 2021-02-06 NOTE — Telephone Encounter (Signed)
Pt is returning phone call please advise

## 2021-02-08 NOTE — Telephone Encounter (Signed)
Noted and agree. 

## 2021-02-09 DIAGNOSIS — E1165 Type 2 diabetes mellitus with hyperglycemia: Secondary | ICD-10-CM | POA: Diagnosis not present

## 2021-02-10 DIAGNOSIS — G4733 Obstructive sleep apnea (adult) (pediatric): Secondary | ICD-10-CM | POA: Diagnosis not present

## 2021-02-14 NOTE — Progress Notes (Signed)
Remote ICD transmission.   

## 2021-02-16 ENCOUNTER — Other Ambulatory Visit: Payer: Self-pay | Admitting: Internal Medicine

## 2021-02-16 ENCOUNTER — Encounter (HOSPITAL_COMMUNITY): Payer: Self-pay

## 2021-02-16 ENCOUNTER — Other Ambulatory Visit: Payer: Self-pay

## 2021-02-16 ENCOUNTER — Observation Stay (HOSPITAL_COMMUNITY)
Admission: EM | Admit: 2021-02-16 | Discharge: 2021-02-19 | Disposition: A | Discharge status: Home / Self Care | Payer: Medicare Other | Attending: Emergency Medicine | Admitting: Emergency Medicine

## 2021-02-16 ENCOUNTER — Ambulatory Visit (HOSPITAL_COMMUNITY)
Admission: EM | Admit: 2021-02-16 | Discharge: 2021-02-16 | Disposition: A | Discharge status: Home / Self Care | Payer: Medicare Other

## 2021-02-16 DIAGNOSIS — J341 Cyst and mucocele of nose and nasal sinus: Secondary | ICD-10-CM | POA: Diagnosis not present

## 2021-02-16 DIAGNOSIS — E1122 Type 2 diabetes mellitus with diabetic chronic kidney disease: Secondary | ICD-10-CM | POA: Diagnosis not present

## 2021-02-16 DIAGNOSIS — I2510 Atherosclerotic heart disease of native coronary artery without angina pectoris: Secondary | ICD-10-CM | POA: Diagnosis not present

## 2021-02-16 DIAGNOSIS — N189 Chronic kidney disease, unspecified: Secondary | ICD-10-CM

## 2021-02-16 DIAGNOSIS — N179 Acute kidney failure, unspecified: Secondary | ICD-10-CM

## 2021-02-16 DIAGNOSIS — Z7901 Long term (current) use of anticoagulants: Secondary | ICD-10-CM | POA: Insufficient documentation

## 2021-02-16 DIAGNOSIS — I130 Hypertensive heart and chronic kidney disease with heart failure and stage 1 through stage 4 chronic kidney disease, or unspecified chronic kidney disease: Secondary | ICD-10-CM | POA: Insufficient documentation

## 2021-02-16 DIAGNOSIS — Z794 Long term (current) use of insulin: Secondary | ICD-10-CM | POA: Insufficient documentation

## 2021-02-16 DIAGNOSIS — I639 Cerebral infarction, unspecified: Secondary | ICD-10-CM | POA: Diagnosis not present

## 2021-02-16 DIAGNOSIS — I672 Cerebral atherosclerosis: Secondary | ICD-10-CM | POA: Diagnosis not present

## 2021-02-16 DIAGNOSIS — Z87891 Personal history of nicotine dependence: Secondary | ICD-10-CM | POA: Diagnosis not present

## 2021-02-16 DIAGNOSIS — M6281 Muscle weakness (generalized): Secondary | ICD-10-CM | POA: Diagnosis not present

## 2021-02-16 DIAGNOSIS — Z95810 Presence of automatic (implantable) cardiac defibrillator: Secondary | ICD-10-CM | POA: Diagnosis not present

## 2021-02-16 DIAGNOSIS — Z79899 Other long term (current) drug therapy: Secondary | ICD-10-CM | POA: Diagnosis not present

## 2021-02-16 DIAGNOSIS — I482 Chronic atrial fibrillation, unspecified: Secondary | ICD-10-CM | POA: Insufficient documentation

## 2021-02-16 DIAGNOSIS — R42 Dizziness and giddiness: Secondary | ICD-10-CM | POA: Diagnosis not present

## 2021-02-16 DIAGNOSIS — I5042 Chronic combined systolic (congestive) and diastolic (congestive) heart failure: Secondary | ICD-10-CM | POA: Insufficient documentation

## 2021-02-16 DIAGNOSIS — Z20822 Contact with and (suspected) exposure to covid-19: Secondary | ICD-10-CM | POA: Insufficient documentation

## 2021-02-16 DIAGNOSIS — G459 Transient cerebral ischemic attack, unspecified: Principal | ICD-10-CM | POA: Insufficient documentation

## 2021-02-16 DIAGNOSIS — R29898 Other symptoms and signs involving the musculoskeletal system: Secondary | ICD-10-CM | POA: Diagnosis not present

## 2021-02-16 DIAGNOSIS — N184 Chronic kidney disease, stage 4 (severe): Secondary | ICD-10-CM | POA: Insufficient documentation

## 2021-02-16 DIAGNOSIS — M542 Cervicalgia: Secondary | ICD-10-CM | POA: Diagnosis not present

## 2021-02-16 DIAGNOSIS — I10 Essential (primary) hypertension: Secondary | ICD-10-CM | POA: Diagnosis not present

## 2021-02-16 LAB — CBC WITH DIFFERENTIAL/PLATELET
Abs Immature Granulocytes: 0.02 10*3/uL (ref 0.00–0.07)
Basophils Absolute: 0 10*3/uL (ref 0.0–0.1)
Basophils Relative: 0 %
Eosinophils Absolute: 0.2 10*3/uL (ref 0.0–0.5)
Eosinophils Relative: 3 %
HCT: 43 % (ref 39.0–52.0)
Hemoglobin: 14.3 g/dL (ref 13.0–17.0)
Immature Granulocytes: 0 %
Lymphocytes Relative: 25 %
Lymphs Abs: 1.8 10*3/uL (ref 0.7–4.0)
MCH: 30 pg (ref 26.0–34.0)
MCHC: 33.3 g/dL (ref 30.0–36.0)
MCV: 90.1 fL (ref 80.0–100.0)
Monocytes Absolute: 0.7 10*3/uL (ref 0.1–1.0)
Monocytes Relative: 9 %
Neutro Abs: 4.7 10*3/uL (ref 1.7–7.7)
Neutrophils Relative %: 63 %
Platelets: 133 10*3/uL — ABNORMAL LOW (ref 150–400)
RBC: 4.77 MIL/uL (ref 4.22–5.81)
RDW: 14.3 % (ref 11.5–15.5)
WBC: 7.4 10*3/uL (ref 4.0–10.5)
nRBC: 0 % (ref 0.0–0.2)

## 2021-02-16 LAB — COMPREHENSIVE METABOLIC PANEL
ALT: 21 U/L (ref 0–44)
AST: 18 U/L (ref 15–41)
Albumin: 3.6 g/dL (ref 3.5–5.0)
Alkaline Phosphatase: 81 U/L (ref 38–126)
Anion gap: 9 (ref 5–15)
BUN: 30 mg/dL — ABNORMAL HIGH (ref 8–23)
CO2: 29 mmol/L (ref 22–32)
Calcium: 9.9 mg/dL (ref 8.9–10.3)
Chloride: 99 mmol/L (ref 98–111)
Creatinine, Ser: 3.51 mg/dL — ABNORMAL HIGH (ref 0.61–1.24)
GFR, Estimated: 18 mL/min — ABNORMAL LOW (ref >60)
Glucose, Bld: 207 mg/dL — ABNORMAL HIGH (ref 70–99)
Potassium: 3.5 mmol/L (ref 3.5–5.1)
Sodium: 137 mmol/L (ref 135–145)
Total Bilirubin: 1 mg/dL (ref 0.3–1.2)
Total Protein: 7 g/dL (ref 6.5–8.1)

## 2021-02-16 LAB — TROPONIN I (HIGH SENSITIVITY)
Troponin I (High Sensitivity): 25 ng/L — ABNORMAL HIGH (ref <18)
Troponin I (High Sensitivity): 26 ng/L — ABNORMAL HIGH (ref <18)

## 2021-02-16 NOTE — Discharge Instructions (Addendum)
EMS transport to ED for further evaluation

## 2021-02-16 NOTE — ED Notes (Signed)
Patient is being discharged from the Urgent Care and sent to the Emergency Department via [facility] . Per Provider Vladimir Faster, patient is in need of higher level of care due to stroke like symptoms. Patient is aware and verbalizes understanding of plan of care.   Vitals:   02/16/21 1643  BP: 110/65  Pulse: 77  Resp: 17  Temp: 98.4 F (36.9 C)  SpO2: 99%

## 2021-02-16 NOTE — ED Triage Notes (Signed)
Pt from UC with [facility]. Pt reports dizziness since Wednesday along with left sided head/neck pain. Pt also reports left leg weakness that has gotten worse since  Wednesday. No vision changes, no n/v.

## 2021-02-16 NOTE — ED Provider Notes (Signed)
[place]    CSN: [id number] Arrival date & time: 02/16/21  1540      History   Chief Complaint Chief Complaint  Patient presents with   Dizziness   Left Side Pain    HPI JASMINE MACEACHERN is a 70 y.o. male with multiple medical problems including PMH of hypertension, hyperlipidemia, diabetes, CAD presents to urgent care today with several nonspecific complaints.  However, on further discussion patient reports onset of dizziness 3 days ago that has become progressively worse.  Patient states he began having weakness in left leg 1 day ago and started "dragging his foot".  He is using a cane today due to weakness in left leg that is not baseline for him.  He states dizziness is worse when moving positions or with any abrupt movement.  Patient denies any recent fever or chills, chest pain, shortness of breath.    Past Medical History:  Diagnosis Date   Arthritis    Benign neoplasm of descending colon    Benign neoplasm of sigmoid colon    Benign neoplasm of transverse colon    CAD in native artery    a. reported MI 2000, 2001, s/p stenting of the circumflex lesion extending into the second obtuse marginal branch with a drug-eluting stent in 2003. b. low risk nuc 2015.   Chronic atrial fibrillation (HCC)    Chronic combined systolic and diastolic CHF (congestive heart failure) ([place])    a. Previously diastolic, then EF 00-93% in 10/2016.   CKD (chronic kidney disease), stage III (HCC)    Depression    Diabetes mellitus    Edema of extremities    Erectile dysfunction    GERD (gastroesophageal reflux disease)    Hyperlipidemia    Hypertension    Myocardial infarction ([place]) 2000&2001   Obesity    Onychomycosis    OSA (obstructive sleep apnea)    S/P ICD (internal cardiac defibrillator) procedure and BiV device,  07/25/17 Medtronic  07/26/2017   Seizures ([place])    Sleep apnea    Stroke ([place])    Tubular adenoma of colon 10/2002    Patient Active Problem List   Diagnosis  Date Noted   COVID-19 vaccine administered 12/27/2020   Neck mass 11/16/2020   Erectile dysfunction 05/31/2020   Polypharmacy 05/22/2020   Porokeratosis 04/26/2020   Sprain of foot 01/25/2020   Neck muscle strain 11/22/2019   Patient cannot afford medications 10/18/2019   Hx of adenomatous colonic polyps    S/P ICD (internal cardiac defibrillator) procedure and BiV device,  07/25/17 Medtronic  07/26/2017   NICM (nonischemic cardiomyopathy) ([place]) 07/25/2017   Congestive heart failure, NYHA class 3, chronic, systolic ([place]) 81/82/9937   Atrial fibrillation, permanent ([place]) 07/25/2017   Memory change    Thrombocytopenia ([place])    Right temporal lobe infarction ([place]) 06/13/2017   Seizures ([place]) 06/12/2017   Sebaceous cyst of skin of breast, left 04/25/2017   Chronic kidney disease (CKD), stage IV (severe) ([place]) 01/22/2017   Secondary hyperparathyroidism of renal origin ([place]) 01/22/2017   Chronic anticoagulation 03/06/2016   Chronic, continuous use of opioids 10/18/2014   OSA on CPAP 04/04/2014   History of tobacco abuse 06/28/2013   DIABETIC  RETINOPATHY 02/24/2008   Insulin dependent type 2 diabetes mellitus ([place]) 07/24/2006   HYPERCHOLESTEROLEMIA 07/24/2006   Depression 07/24/2006   HYPERTENSION, BENIGN SYSTEMIC 07/24/2006   Coronary atherosclerosis 07/24/2006   GASTROESOPHAGEAL REFLUX, NO ESOPHAGITIS 07/24/2006    Past Surgical History:  Procedure Laterality Date  ANGIOPLASTY  2001   stent x 1   AV NODE ABLATION N/A 08/20/2017   Procedure: AV NODE ABLATION;  Surgeon: Deboraha Sprang, MD;  Location: [facility];  Service: Cardiovascular;  Laterality: N/A;   BIV ICD INSERTION CRT-D N/A 07/25/2017   Procedure: BIV ICD INSERTION CRT-D;  Surgeon: Deboraha Sprang, MD;  Location: [facility];  Service: Cardiovascular;  Laterality: N/A;   COLONOSCOPY  2000?   negative   COLONOSCOPY WITH PROPOFOL N/A 02/08/2019   Procedure: COLONOSCOPY WITH PROPOFOL;  Surgeon: Ladene Artist, MD;  Location: [facility];  Service: Endoscopy;  Laterality: N/A;   FOOT ARTHROTOMY Right    POLYPECTOMY  02/08/2019   Procedure: POLYPECTOMY;  Surgeon: Ladene Artist, MD;  Location: [facility];  Service: Endoscopy;;   TOE AMPUTATION Left 2012       Home Medications    Prior to Admission medications   Medication Sig Start Date End Date Taking? Authorizing Provider  apixaban (ELIQUIS) 5 MG TABS tablet Take 1 tablet (5 mg total) by mouth 2 (two) times daily. 06/13/20   Deboraha Sprang, MD  calcitRIOL (ROCALTROL) 0.5 MCG capsule Take 0.5 mcg by mouth daily at 12 noon.  05/06/18   [provider]  carvedilol (COREG) 25 MG tablet Take 1 tablet (25 mg total) by mouth 2 (two) times daily. Please make appt with Dr. Johney Frame (Cardiologist) before anymore refills. Thank you 1st attempt 11/06/20   Freada Bergeron, MD  Continuous Blood Gluc Receiver (FREESTYLE LIBRE 14 DAY READER) DEVI Apply topically as directed. 12/29/19   [provider]  Continuous Blood Gluc Sensor (FREESTYLE LIBRE 14 DAY SENSOR) MISC 1 Device by Does not apply route every 14 (fourteen) days. 12/20/20   Leavy Cella, RPH-CPP  Dulaglutide (TRULICITY) 1.5 ZO/1.0RU SOPN Inject 1.5 mg into the skin once a week. 07/05/20   Zenia Resides, MD  empagliflozin (JARDIANCE) 10 MG TABS tablet Take 1 tablet (10 mg total) by mouth daily before breakfast. 06/12/20   Dorothy Spark, MD  furosemide (LASIX) 40 MG tablet Take 2 tablets (80 mg total) by mouth daily. 06/07/20   Deboraha Sprang, MD  gabapentin (NEURONTIN) 300 MG capsule Take 1 capsule (300 mg total) by mouth at bedtime. 06/02/20   [name], MD  glucose blood [facility] VERIO) test strip Use as instructed 01/11/19   [name], MD  hydrALAZINE (APRESOLINE) 50 MG tablet Take 50 mg by mouth 3 (three) times daily. 08/20/19   [provider]  insulin aspart (NOVOLOG FLEXPEN) 100 UNIT/ML FlexPen Inject 12 Units into the skin daily before supper. Or  largest meal of the day. 01/11/21   Leavy Cella, RPH-CPP  insulin degludec (TRESIBA FLEXTOUCH) 200 UNIT/ML FlexTouch Pen Inject 32 Units into the skin daily. 01/04/21   Zenia Resides, MD  Insulin Pen Needle (PEN NEEDLES) 32G X 4 MM MISC 1 Device by Does not apply route as directed. 09/16/19   Zenia Resides, MD  KLOR-CON M20 20 MEQ tablet TAKE 1 TABLET BY MOUTH TWICE A DAY 01/31/21   Lattie Haw, MD  Lancets [facility] ULTRASOFT) lancets Use as instructed 01/13/19   [name], MD  loratadine (CLARITIN) 10 MG tablet Take 10 mg by mouth daily.    [provider]  losartan (COZAAR) 25 MG tablet TAKE 1 TABLET BY MOUTH EVERY DAY 11/28/20   Lattie Haw, MD  Multiple Vitamin (MULTIVITAMIN) tablet Take 1 tablet by mouth  daily.      [provider]  nitroGLYCERIN (NITROSTAT) 0.4 MG SL tablet Place 1 tablet (0.4 mg total) under the tongue every 5 (five) minutes as needed for chest pain. If no relief by 3rd tab, call 911 09/16/19   Zenia Resides, MD  omeprazole (PRILOSEC) 40 MG capsule TAKE ONE CAPSULE BY MOUTH DAILY BEFORE SUPPER 02/01/21   Lattie Haw, MD  oxymorphone (OPANA) 5 MG tablet Take 5 mg by mouth 3 (three) times daily as needed for pain (max 3 tablets daily). Per Dr Alvin Critchley, MD  PARoxetine (PAXIL) 30 MG tablet TAKE 1 TABLET BY MOUTH EVERY DAY 12/20/20   Lattie Haw, MD  rosuvastatin (CRESTOR) 20 MG tablet TAKE 1 TABLET BY MOUTH EVERY DAY 06/27/20   Milus Banister C, DO  tadalafil (CIALIS) 5 MG tablet Take 1 tablet (5 mg total) by mouth daily as needed for erectile dysfunction. 05/30/20   [name], MD  tamsulosin (FLOMAX) 0.4 MG CAPS capsule Take 0.4 mg by mouth.    [provider]  vitamin B-12 (CYANOCOBALAMIN) 1000 MCG tablet Take 1,000 mcg by mouth daily.    [provider]    Family History Family History  Problem Relation Age of Onset   Heart attack Mother    CVA Mother    Diabetes Mother    Heart attack Father     Colon cancer Neg Hx    Stomach cancer Neg Hx     Social History Social History   Tobacco Use   Smoking status: Former    Packs/day: 0.25    Years: 45.00    Pack years: 11.25    Types: Cigarettes    Start date: 05/28/1967    Quit date: 10/28/2018    Years since quitting: 2.3   Smokeless tobacco: Never  Vaping Use   Vaping Use: Never used  Substance Use Topics   Alcohol use: No    Comment: quit in 1993   Drug use: No     Allergies   Enalapril maleate   Review of Systems As stated in HPI otherwise negative   Physical Exam Triage Vital Signs ED Triage Vitals  Enc Vitals Group     BP 02/16/21 1643 110/65     Pulse Rate 02/16/21 1643 77     Resp 02/16/21 1643 17     Temp 02/16/21 1643 98.4 F (36.9 C)     Temp Source 02/16/21 1643 Oral     SpO2 02/16/21 1643 99 %     Weight --      Height --      Head Circumference --      Peak Flow --      Pain Score 02/16/21 1648 6     Pain Loc --      Pain Edu? --      Excl. in [place]? --    No data found.  Updated Vital Signs BP 110/65 (BP Location: Right Arm)   Pulse 77   Temp 98.4 F (36.9 C) (Oral)   Resp 17   SpO2 99%   Visual Acuity Right Eye Distance:   Left Eye Distance:   Bilateral Distance:    Right Eye Near:   Left Eye Near:    Bilateral Near:     Physical Exam Constitutional:      General: He is not in acute distress.    Appearance: Normal appearance. He is not ill-appearing.  Eyes:     Extraocular  Movements: Extraocular movements intact.  Cardiovascular:     Rate and Rhythm: Normal rate and regular rhythm.  Pulmonary:     Effort: Pulmonary effort is normal.     Breath sounds: Normal breath sounds.  Abdominal:     General: Bowel sounds are normal.     Palpations: Abdomen is soft.  Musculoskeletal:     Cervical back: Normal range of motion and neck supple.  Skin:    General: Skin is warm and dry.  Neurological:     Mental Status: He is alert and oriented to person, place, and time.      Motor: Weakness present.     Comments: On limited exam no facial asymmetry, tongue is midline, no pronator drift 5/5 strength in upper extremities.  LLE with 3/5 strength and difficulty with ambulation  Psychiatric:        Mood and Affect: Mood normal.        Behavior: Behavior normal.     UC Treatments / Results  Labs (all labs ordered are listed, but only abnormal results are displayed) Labs Reviewed - No data to display  EKG   Radiology No results found.  Procedures Procedures (including critical care time)  Medications Ordered in UC Medications - No data to display  Initial Impression / Assessment and Plan / UC Course  I have reviewed the triage vital signs and the nursing notes.  Pertinent labs & imaging results that were available during my care of the patient were reviewed by me and considered in my medical decision making (see chart for details).  Dizziness, LLE weakness -Findings concerning for acute neurological event.  Onset of initial symptoms 3 days prior to this visit -Given history and exam findings will transport patient via EMS to ED for further evaluation -Patient agreeable to transport via [facility] as he drove himself to urgent care   Final Clinical Impressions(s) / UC Diagnoses   Final diagnoses:  None   Discharge Instructions   None    ED Prescriptions   None    PDMP not reviewed this encounter.   Rudolpho Sevin, NP 02/16/21 1743

## 2021-02-16 NOTE — ED Triage Notes (Signed)
Pt presents with ongoing dizziness & left side pain from neck to legs X 3 days.

## 2021-02-16 NOTE — ED Notes (Signed)
[facility] called & in in route  ED Charge called & notified.

## 2021-02-16 NOTE — ED Provider Notes (Addendum)
Emergency Medicine Provider Triage Evaluation Note  Anthony [name] , a 70 y.o. male  was evaluated in triage.  Pt complains of left-sided headache and neck pain and left-sided leg weakness.  Patient states that he has had a headache starting Wednesday has been left-sided which is now subsided.  He also feels like the pain is traveling down his neck into the lateral side.  He also feels like he has left-sided weakness worse than his baseline, though he cannot tell me why he has left-sided weakness at baseline.  He was standing from urgent care for further evaluation.  Denies any chest pain or shortness of breath, but does endorse dizziness.  Review of Systems  Positive: As above Negative: As above  Physical Exam  BP 108/73 (BP Location: Right Arm)   Pulse 74   Temp 98.9 F (37.2 C)   Resp 18   SpO2 100%  Gen:   Awake, no distress   Resp:  Normal effort  MSK:   Moves extremities without difficulty  Other:  5/5 strength upper lower extremities bilaterally, pupils equal and reactive bilaterally, no cervical spine tenderness  Medical Decision Making  Medically screening exam initiated at 6:28 PM.  Appropriate orders placed.  DIMITRIOUS MICCICHE was informed that the remainder of the evaluation will be completed by another provider, this initial triage assessment does not replace that evaluation, and the importance of remaining in the ED until their evaluation is complete.  Discussed case with Dr. Vanita Panda, will proceed forward with CT of the head and neck to evaluate for possible dissection/stroke  Addendum: GFR of 18, cannot get contrast, will proceed forward with MRA of head and neck    Lyndel Safe 02/16/21 1830    Carmin Muskrat, MD 02/16/21 1953    Garald Balding, PA-C 02/16/21 2030    Carmin Muskrat, MD 02/16/21 2255

## 2021-02-17 ENCOUNTER — Emergency Department (HOSPITAL_COMMUNITY): Payer: Medicare Other

## 2021-02-17 ENCOUNTER — Observation Stay (HOSPITAL_COMMUNITY): Payer: Medicare Other

## 2021-02-17 ENCOUNTER — Observation Stay (HOSPITAL_BASED_OUTPATIENT_CLINIC_OR_DEPARTMENT_OTHER): Payer: Medicare Other

## 2021-02-17 ENCOUNTER — Encounter (HOSPITAL_COMMUNITY): Payer: Self-pay | Admitting: Neurology

## 2021-02-17 DIAGNOSIS — M542 Cervicalgia: Secondary | ICD-10-CM | POA: Diagnosis not present

## 2021-02-17 DIAGNOSIS — I482 Chronic atrial fibrillation, unspecified: Secondary | ICD-10-CM | POA: Diagnosis not present

## 2021-02-17 DIAGNOSIS — J329 Chronic sinusitis, unspecified: Secondary | ICD-10-CM | POA: Diagnosis not present

## 2021-02-17 DIAGNOSIS — I639 Cerebral infarction, unspecified: Secondary | ICD-10-CM

## 2021-02-17 DIAGNOSIS — I6930 Unspecified sequelae of cerebral infarction: Secondary | ICD-10-CM

## 2021-02-17 DIAGNOSIS — N189 Chronic kidney disease, unspecified: Secondary | ICD-10-CM | POA: Diagnosis not present

## 2021-02-17 DIAGNOSIS — Z7901 Long term (current) use of anticoagulants: Secondary | ICD-10-CM

## 2021-02-17 DIAGNOSIS — I672 Cerebral atherosclerosis: Secondary | ICD-10-CM | POA: Diagnosis not present

## 2021-02-17 DIAGNOSIS — G459 Transient cerebral ischemic attack, unspecified: Secondary | ICD-10-CM | POA: Diagnosis not present

## 2021-02-17 DIAGNOSIS — J341 Cyst and mucocele of nose and nasal sinus: Secondary | ICD-10-CM | POA: Diagnosis not present

## 2021-02-17 DIAGNOSIS — N179 Acute kidney failure, unspecified: Secondary | ICD-10-CM

## 2021-02-17 DIAGNOSIS — R29898 Other symptoms and signs involving the musculoskeletal system: Secondary | ICD-10-CM | POA: Diagnosis present

## 2021-02-17 LAB — GLUCOSE, CAPILLARY
Glucose-Capillary: 151 mg/dL — ABNORMAL HIGH (ref 70–99)
Glucose-Capillary: 244 mg/dL — ABNORMAL HIGH (ref 70–99)

## 2021-02-17 LAB — RESP PANEL BY RT-PCR (FLU A&B, COVID) ARPGX2
Influenza A by PCR: NEGATIVE
Influenza B by PCR: NEGATIVE
SARS Coronavirus 2 by RT PCR: NEGATIVE

## 2021-02-17 LAB — RENAL FUNCTION PANEL
Albumin: 3.2 g/dL — ABNORMAL LOW (ref 3.5–5.0)
Anion gap: 11 (ref 5–15)
BUN: 31 mg/dL — ABNORMAL HIGH (ref 8–23)
CO2: 25 mmol/L (ref 22–32)
Calcium: 9.4 mg/dL (ref 8.9–10.3)
Chloride: 100 mmol/L (ref 98–111)
Creatinine, Ser: 3.31 mg/dL — ABNORMAL HIGH (ref 0.61–1.24)
GFR, Estimated: 19 mL/min — ABNORMAL LOW (ref >60)
Glucose, Bld: 225 mg/dL — ABNORMAL HIGH (ref 70–99)
Phosphorus: 4.5 mg/dL (ref 2.5–4.6)
Potassium: 3.6 mmol/L (ref 3.5–5.1)
Sodium: 136 mmol/L (ref 135–145)

## 2021-02-17 LAB — CBG MONITORING, ED
Glucose-Capillary: 206 mg/dL — ABNORMAL HIGH (ref 70–99)
Glucose-Capillary: 222 mg/dL — ABNORMAL HIGH (ref 70–99)

## 2021-02-17 LAB — TSH: TSH: 0.935 u[IU]/mL (ref 0.350–4.500)

## 2021-02-17 LAB — ECHOCARDIOGRAM COMPLETE
Area-P 1/2: 3.48 cm2
S' Lateral: 3.1 cm

## 2021-02-17 LAB — MRSA NEXT GEN BY PCR, NASAL: MRSA by PCR Next Gen: NOT DETECTED

## 2021-02-17 LAB — HIV ANTIBODY (ROUTINE TESTING W REFLEX): HIV Screen 4th Generation wRfx: NONREACTIVE

## 2021-02-17 LAB — VITAMIN B12: Vitamin B-12: 4025 pg/mL — ABNORMAL HIGH (ref 180–914)

## 2021-02-17 MED ORDER — ROSUVASTATIN CALCIUM 20 MG PO TABS
20.0000 mg | ORAL_TABLET | Freq: Every day | ORAL | Status: DC
  Administered 2021-02-17 – 2021-02-19 (×3): 20 mg via ORAL
  Filled 2021-02-17 (×3): qty 1

## 2021-02-17 MED ORDER — APIXABAN 5 MG PO TABS
5.0000 mg | ORAL_TABLET | Freq: Two times a day (BID) | ORAL | Status: DC
  Administered 2021-02-17 – 2021-02-19 (×5): 5 mg via ORAL
  Filled 2021-02-17 (×5): qty 1

## 2021-02-17 MED ORDER — CALCITRIOL 0.25 MCG PO CAPS
0.5000 ug | ORAL_CAPSULE | Freq: Every day | ORAL | Status: DC
  Administered 2021-02-17 – 2021-02-19 (×3): 0.5 ug via ORAL
  Filled 2021-02-17 (×2): qty 2
  Filled 2021-02-17: qty 1

## 2021-02-17 MED ORDER — GABAPENTIN 300 MG PO CAPS
300.0000 mg | ORAL_CAPSULE | Freq: Every day | ORAL | Status: DC
  Administered 2021-02-17 – 2021-02-18 (×2): 300 mg via ORAL
  Filled 2021-02-17 (×2): qty 1

## 2021-02-17 MED ORDER — MORPHINE SULFATE 15 MG PO TABS
15.0000 mg | ORAL_TABLET | Freq: Once | ORAL | Status: AC | PRN
  Administered 2021-02-18: 15 mg via ORAL
  Filled 2021-02-17: qty 1

## 2021-02-17 MED ORDER — APIXABAN 5 MG PO TABS: 5.0000 mg | ORAL_TABLET | Freq: Two times a day (BID) | ORAL | Status: DC

## 2021-02-17 MED ORDER — PANTOPRAZOLE SODIUM 40 MG PO TBEC
80.0000 mg | DELAYED_RELEASE_TABLET | Freq: Every day | ORAL | Status: DC
  Administered 2021-02-17 – 2021-02-19 (×3): 80 mg via ORAL
  Filled 2021-02-17 (×3): qty 2

## 2021-02-17 MED ORDER — HYDRALAZINE HCL 50 MG PO TABS
50.0000 mg | ORAL_TABLET | Freq: Three times a day (TID) | ORAL | Status: DC
  Administered 2021-02-17 – 2021-02-19 (×8): 50 mg via ORAL
  Filled 2021-02-17 (×7): qty 1
  Filled 2021-02-17: qty 2

## 2021-02-17 MED ORDER — PAROXETINE HCL 30 MG PO TABS
30.0000 mg | ORAL_TABLET | Freq: Every day | ORAL | Status: DC
  Administered 2021-02-17 – 2021-02-19 (×3): 30 mg via ORAL
  Filled 2021-02-17 (×3): qty 1

## 2021-02-17 MED ORDER — CARVEDILOL 25 MG PO TABS
25.0000 mg | ORAL_TABLET | Freq: Two times a day (BID) | ORAL | Status: DC
  Administered 2021-02-17 – 2021-02-19 (×5): 25 mg via ORAL
  Filled 2021-02-17 (×2): qty 1
  Filled 2021-02-17: qty 8
  Filled 2021-02-17 (×2): qty 1

## 2021-02-17 MED ORDER — INSULIN ASPART 100 UNIT/ML IJ SOLN
0.0000 [IU] | Freq: Three times a day (TID) | INTRAMUSCULAR | Status: DC
  Administered 2021-02-17 (×2): 3 [IU] via SUBCUTANEOUS
  Administered 2021-02-18: 2 [IU] via SUBCUTANEOUS
  Administered 2021-02-18: 3 [IU] via SUBCUTANEOUS
  Administered 2021-02-19 (×2): 2 [IU] via SUBCUTANEOUS
  Administered 2021-02-19: 5 [IU] via SUBCUTANEOUS

## 2021-02-17 MED ORDER — TAMSULOSIN HCL 0.4 MG PO CAPS
0.4000 mg | ORAL_CAPSULE | Freq: Every day | ORAL | Status: DC
  Administered 2021-02-17 – 2021-02-19 (×3): 0.4 mg via ORAL
  Filled 2021-02-17 (×3): qty 1

## 2021-02-17 NOTE — Consult Note (Addendum)
Neurology Consult H&P  Anthony [name] MR# [id number] 02/17/2021  CC: left sided weakness  History is obtained from: patient and chart  HPI: Anthony [name] is a 70 y.o. male PMHx as reviewed below, A-fib (apixaban) s/p pacemaker implantation developed numbness and weakness of left arm and leg when he woke the morning of 02/14/2021. He said he woke in the morning and blew his nose as he usually does and felt a sensation of water running down the left side of his head behind his ear and down his left neck. When he got up to walk he felt unsteady and had to sit back down. He noticed he had numbness in his left arm as well as numbness and weakness in his left leg. His symptoms improved by Thursday but Friday he notice that his left leg would jerk every time he tried to lift it and he became worried about stroke and presented to ED for further evaluation.  He denies missing dose of apixaban.  LKW: unclear tNK given: No OSw IR Thrombectomy No Modified Rankin Scale: 1-No significant post stroke disability and can perform usual duties with stroke symptoms NIHSS: 0  ROS: A complete ROS was performed and is negative except as noted in the HPI.   Past Medical History:  Diagnosis Date   Arthritis    Benign neoplasm of descending colon    Benign neoplasm of sigmoid colon    Benign neoplasm of transverse colon    CAD in native artery    a. reported MI 2000, 2001, s/p stenting of the circumflex lesion extending into the second obtuse marginal branch with a drug-eluting stent in 2003. b. low risk nuc 2015.   Chronic atrial fibrillation (HCC)    Chronic combined systolic and diastolic CHF (congestive heart failure) ([place])    a. Previously diastolic, then EF 29-92% in 10/2016.   CKD (chronic kidney disease), stage III (HCC)    Depression    Diabetes mellitus    Edema of extremities    Erectile dysfunction    GERD (gastroesophageal reflux disease)    Hyperlipidemia    Hypertension    Myocardial  infarction (HCC) 2000&2001   Obesity    Onychomycosis    OSA (obstructive sleep apnea)    S/P ICD (internal cardiac defibrillator) procedure and BiV device,  07/25/17 Medtronic  07/26/2017   Seizures ([place])    Sleep apnea    Stroke ([place])    Tubular adenoma of colon 10/2002    Family History  Problem Relation Age of Onset   Heart attack Mother    CVA Mother    Diabetes Mother    Heart attack Father    Colon cancer Neg Hx    Stomach cancer Neg Hx     Social History:  reports that he quit smoking about 2 years ago. His smoking use included cigarettes. He started smoking about 53 years ago. He has a 11.25 pack-year smoking history. He has never used smokeless tobacco. He reports that he does not drink alcohol and does not use drugs.   Prior to Admission medications   Medication Sig Start Date End Date Taking? Authorizing Provider  apixaban (ELIQUIS) 5 MG TABS tablet Take 1 tablet (5 mg total) by mouth 2 (two) times daily. 06/13/20   Deboraha Sprang, MD  calcitRIOL (ROCALTROL) 0.5 MCG capsule Take 0.5 mcg by mouth daily at 12 noon.  05/06/18   [provider]  carvedilol (COREG) 25 MG tablet Take 1 tablet (25 mg  total) by mouth 2 (two) times daily. Please make appt with Dr. Johney Frame (Cardiologist) before anymore refills. Thank you 1st attempt 11/06/20   Freada Bergeron, MD  Continuous Blood Gluc Receiver (FREESTYLE LIBRE 14 DAY READER) DEVI Apply topically as directed. 12/29/19   [provider]  Continuous Blood Gluc Sensor (FREESTYLE LIBRE 14 DAY SENSOR) MISC 1 Device by Does not apply route every 14 (fourteen) days. 12/20/20   Leavy Cella, RPH-CPP  Dulaglutide (TRULICITY) 1.5 NW/2.9FA SOPN Inject 1.5 mg into the skin once a week. 07/05/20   Zenia Resides, MD  empagliflozin (JARDIANCE) 10 MG TABS tablet Take 1 tablet (10 mg total) by mouth daily before breakfast. 06/12/20   Dorothy Spark, MD  furosemide (LASIX) 40 MG tablet Take 2 tablets (80 mg total) by mouth  daily. 06/07/20   Deboraha Sprang, MD  gabapentin (NEURONTIN) 300 MG capsule Take 1 capsule (300 mg total) by mouth at bedtime. 06/02/20   [name], MD  glucose blood [facility] VERIO) test strip Use as instructed 01/11/19   [name], MD  hydrALAZINE (APRESOLINE) 50 MG tablet Take 50 mg by mouth 3 (three) times daily. 08/20/19   [provider]  insulin aspart (NOVOLOG FLEXPEN) 100 UNIT/ML FlexPen Inject 12 Units into the skin daily before supper. Or largest meal of the day. 01/11/21   Leavy Cella, RPH-CPP  insulin degludec (TRESIBA FLEXTOUCH) 200 UNIT/ML FlexTouch Pen Inject 32 Units into the skin daily. 01/04/21   Zenia Resides, MD  Insulin Pen Needle (PEN NEEDLES) 32G X 4 MM MISC 1 Device by Does not apply route as directed. 09/16/19   Zenia Resides, MD  KLOR-CON M20 20 MEQ tablet TAKE 1 TABLET BY MOUTH TWICE A DAY 01/31/21   Lattie Haw, MD  Lancets [facility] ULTRASOFT) lancets Use as instructed 01/13/19   [name], MD  loratadine (CLARITIN) 10 MG tablet Take 10 mg by mouth daily.    [provider]  losartan (COZAAR) 25 MG tablet TAKE 1 TABLET BY MOUTH EVERY DAY 11/28/20   Lattie Haw, MD  Multiple Vitamin (MULTIVITAMIN) tablet Take 1 tablet by mouth daily.      [provider]  nitroGLYCERIN (NITROSTAT) 0.4 MG SL tablet Place 1 tablet (0.4 mg total) under the tongue every 5 (five) minutes as needed for chest pain. If no relief by 3rd tab, call 911 09/16/19   Zenia Resides, MD  omeprazole (PRILOSEC) 40 MG capsule TAKE ONE CAPSULE BY MOUTH DAILY BEFORE SUPPER 02/01/21   Lattie Haw, MD  oxymorphone (OPANA) 5 MG tablet Take 5 mg by mouth 3 (three) times daily as needed for pain (max 3 tablets daily). Per Dr Alvin Critchley, MD  PARoxetine (PAXIL) 30 MG tablet TAKE 1 TABLET BY MOUTH EVERY DAY 12/20/20   Lattie Haw, MD  rosuvastatin (CRESTOR) 20 MG tablet TAKE 1 TABLET BY MOUTH EVERY DAY 06/27/20   Milus Banister C, DO  tadalafil (CIALIS) 5  MG tablet Take 1 tablet (5 mg total) by mouth daily as needed for erectile dysfunction. 05/30/20   [name], MD  tamsulosin (FLOMAX) 0.4 MG CAPS capsule Take 0.4 mg by mouth.    [provider]  vitamin B-12 (CYANOCOBALAMIN) 1000 MCG tablet Take 1,000 mcg by mouth daily.    [provider]    Exam: Current vital signs: BP (!) 132/91   Pulse 71   Temp 98.9 F (37.2 C)   Resp  16   SpO2 97%   Physical Exam  Constitutional: Appears well-developed and well-nourished.  Psych: Affect appropriate to situation Eyes: No scleral injection HENT: No OP obstruction. Head: Normocephalic.  Cardiovascular: Normal rate and regular rhythm.  Respiratory: Effort normal, symmetric excursions bilaterally, no audible wheezing. GI: Soft.  No distension. There is no tenderness.  Skin: WDI  Neuro: Mental Status: Patient is awake, alert, oriented to person, place, month, year, and situation. Patient is able to give a clear and coherent history. Speech  fluent, intact comprehension and repetition. No signs of aphasia or neglect. Visual Fields are full. Pupils are equal, round, and reactive to light. EOMI without ptosis or diploplia.  Facial sensation is symmetric to temperature Facial movement is symmetric.  Hearing is intact to voice. Uvula midline and palate elevates symmetrically. Shoulder shrug is symmetric. Tongue is midline without atrophy or fasciculations.  Tone is normal. Bulk is normal. ~4+/5 left lower extremity Sensation is symmetric to cold temperature in the arms and legs. Deep Tendon Reflexes: 2+ and symmetric in the biceps and patellae. Toes are downgoing on right mute on left. FNF and HKS are intact bilaterally. Gait - Deferred  I have reviewed labs in epic and the pertinent results are: Toponin 26  I have reviewed the images obtained: NCT head showed no acute ischemic changes, chronic left frontal stroke withy encephalomalacia and punctate remote infarct in  the left basal ganglia and left cerebellar stokes.  Assessment: VERDIE BARROWS is a 69 y.o. male PMHx as above, A-fib (apixaban) s/p pacemaker implantation with left lower extremity weakness which is only mild and improved since onset. He has significant vascular risk factors and pacemaker which is MRI compatible however it is not clear if he will be able to have MRI over weekend. He also has GFR18 and cannot have contrast for vessel imaging.  Pacemaker is known and MRI compatible. He cannot have contrast due to CKD.  Impression:  Acute ischemic stroke Chronic ischemic and embolic strokes HTN CAD HTN  HLD Seizure?  Plan: - MRI brain without contrast. - Recommend vascular imaging with MRA head and neck without contrast. - If he cannot have MRI/MRA, he may have carotid ultrasound and repeat NCT head along with the rest of the stroke workup. - Recommend TTE. - Recommend labs: HbA1c, lipid panel. - Recommend Statin if LDL > 70 - Continue rivaroxaban - BP goal <130/90. - Telemetry monitoring for arrhythmia. - Recommend bedside Swallow screen. - Recommend Stroke education. - Recommend PT/OT/SLP consult.   Electronically signed by:  Lynnae Sandhoff, MD Page: [id number] 02/17/2021, 4:11 AM

## 2021-02-17 NOTE — Evaluation (Signed)
Clinical/Bedside Swallow Evaluation Patient Details  Name: Anthony [name] MRN: [id number] Date of Birth: 06/14/1950  Today's Date: 02/17/2021 Time: SLP Start Time (ACUTE ONLY): 1003 SLP Stop Time (ACUTE ONLY): 1017 SLP Time Calculation (min) (ACUTE ONLY): 14 min  Past Medical History:  Past Medical History:  Diagnosis Date   Arthritis    Benign neoplasm of descending colon    Benign neoplasm of sigmoid colon    Benign neoplasm of transverse colon    CAD in native artery    a. reported MI 2000, 2001, s/p stenting of the circumflex lesion extending into the second obtuse marginal branch with a drug-eluting stent in 2003. b. low risk nuc 2015.   Chronic atrial fibrillation (HCC)    Chronic combined systolic and diastolic CHF (congestive heart failure) ([place])    a. Previously diastolic, then EF 37-85% in 10/2016.   CKD (chronic kidney disease), stage III (HCC)    Depression    Diabetes mellitus    Edema of extremities    Erectile dysfunction    GERD (gastroesophageal reflux disease)    Hyperlipidemia    Hypertension    Myocardial infarction ([place]) 2000&2001   Obesity    Onychomycosis    OSA (obstructive sleep apnea)    S/P ICD (internal cardiac defibrillator) procedure and BiV device,  07/25/17 Medtronic  07/26/2017   Seizures ([place])    Sleep apnea    Stroke ([place])    Tubular adenoma of colon 10/2002   Past Surgical History:  Past Surgical History:  Procedure Laterality Date   ANGIOPLASTY  2001   stent x 1   AV NODE ABLATION N/A 08/20/2017   Procedure: AV NODE ABLATION;  Surgeon: Deboraha Sprang, MD;  Location: [facility];  Service: Cardiovascular;  Laterality: N/A;   BIV ICD INSERTION CRT-D N/A 07/25/2017   Procedure: BIV ICD INSERTION CRT-D;  Surgeon: Deboraha Sprang, MD;  Location: [facility];  Service: Cardiovascular;  Laterality: N/A;   COLONOSCOPY  2000?   negative   COLONOSCOPY WITH PROPOFOL N/A 02/08/2019   Procedure: COLONOSCOPY WITH PROPOFOL;  Surgeon: Ladene Artist, MD;  [facility];  Service: Endoscopy;  Laterality: N/A;   FOOT ARTHROTOMY Right    POLYPECTOMY  02/08/2019   Procedure: POLYPECTOMY;  Surgeon: Ladene Artist, MD;  [facility]: [facility];  Service: Endoscopy;;   TOE AMPUTATION Left 2012   HPI:  70 y.o. male presented to ED with left sided numbness. PMHx A-fib, pacemaker, GERD, DM, stroke. CT head negative for acute event; chronic left frontal stroke with encephalomalacia; punctate remote infarct left BG and left cerebellum.    Assessment / Plan / Recommendation  Clinical Impression  Pt presents with normal oropharyngeal swallow c/b adequate mastication despite absence of teeth, brisk swallow response, no s/s of aspiration with any consistencies.  Pt is already on a regular diet, thin liquids. No dysphagia. No SLP f/u needed. Our service will sign off. D/W pt and RN. SLP Visit Diagnosis: Dysphagia, unspecified (R13.10)    Aspiration Risk  No limitations    Diet Recommendation   Regular solids, thin liquids  Medication Administration: Whole meds with liquid    Other  Recommendations Oral Care Recommendations: Oral care BID    Recommendations for follow up therapy are one component of a multi-disciplinary discharge planning process, led by the attending physician.  Recommendations may be updated based on patient status, additional functional criteria and insurance authorization.  Follow up Recommendations None  Frequency and Duration            Prognosis        Swallow Study   General HPI: 70 y.o. male presented to ED with left sided numbness. PMHx A-fib, pacemaker, GERD, DM, stroke. CT head negative for acute event; chronic left frontal stroke with encephalomalacia; punctate remote infarct left BG and left cerebellum. Type of Study: Bedside Swallow Evaluation Previous Swallow Assessment: [id number] after CVA, initial dysphagia which resolved Diet Prior to this Study: Regular;Thin liquids Temperature  Spikes Noted: No Respiratory Status: Room air History of Recent Intubation: No Behavior/Cognition: Alert;Cooperative;Pleasant mood Oral Cavity Assessment: Within Functional Limits Oral Care Completed by SLP: No Oral Cavity - Dentition: Edentulous (does not wear dentures) Vision: Functional for self-feeding Self-Feeding Abilities: Able to feed self Patient Positioning: Upright in bed Baseline Vocal Quality: Normal Volitional Cough: Strong Volitional Swallow: Able to elicit    Oral/Motor/Sensory Function Overall Oral Motor/Sensory Function: Within functional limits   Ice Chips Ice chips: Within functional limits   Thin Liquid Thin Liquid: Within functional limits    Nectar Thick Nectar Thick Liquid: Not tested   Honey Thick Honey Thick Liquid: Not tested   Puree Puree: Within functional limits   Solid     Solid: Within functional limits      Anthony [name] 02/17/2021,10:22 AM  Estill Bamberg L. Tivis Ringer, [place] Office number [phone] Pager [phone]

## 2021-02-17 NOTE — Plan of Care (Signed)
  Problem: Clinical Measurements: Goal: Ability to maintain clinical measurements within normal limits will improve Outcome: Progressing Goal: Will remain free from infection Outcome: Progressing Goal: Diagnostic test results will improve Outcome: Progressing Goal: Respiratory complications will improve Outcome: Not Applicable Goal: Cardiovascular complication will be avoided Outcome: Progressing   Problem: Activity: Goal: Risk for activity intolerance will decrease Outcome: Progressing   Problem: Nutrition: Goal: Adequate nutrition will be maintained Outcome: Progressing   Problem: Coping: Goal: Level of anxiety will decrease Outcome: Progressing   Problem: Elimination: Goal: Will not experience complications related to urinary retention Outcome: Progressing   Problem: Pain Managment: Goal: General experience of comfort will improve Outcome: Progressing   Problem: Safety: Goal: Ability to remain free from injury will improve Outcome: Progressing   Problem: Skin Integrity: Goal: Risk for impaired skin integrity will decrease Outcome: Progressing

## 2021-02-17 NOTE — H&P (Signed)
[facility] Admission History and Physical Service Pager: [phone]  Patient name: Anthony [name] Medical record number: [id number] Date of birth: Nov 12, 1950 Age: 70 y.o. Gender: male  Primary Care Provider: Lattie Haw, MD Consultants: Neurology Code Status: FULL  Preferred Emergency Contact: Slater Mcmanaman (wife) [phone]  Chief Complaint: Left leg weakness w/ dizziness  Assessment and Plan: BENNET KUJAWA is a 70 y.o. male presenting with left sided leg weakness, with left sideded headache and neck pain . PMH is significant for A. Fib, CHF (diastolic and systolic, EF 95-18%), s/p AICD (Medtronic device placed 2019), CAD s/p stent placement, Seizures, CVA, CKD IV, HTN, HLD, DM2, CAD, MDD, OSA, and GERD.  Left Leg Weakness  Dizziness Patient presents with left leg weakness, after having a day of left sided headache, neck pain, and dizziness 3 days ago that has slowly improved overtime. He is back to baseline without pain, weakness, or dizziness. Labs showed PLT 133, Glc 207, BUN 30, Cr 3.51, Trop x2 (25, 26). CT Head showed no acute intracranial abnormality, with stable atrophy, chronic small vessel ischemia, and remote infarcts. CT Cervical spine showed multilevel degenerative disc disease and facet hypertrophy throughout cervical spine. No high-grade canal stenosis. Physical Exam demonstrated symmetric strength throughout, with symmetric sensory sensation. Neurology consulted and wants a MRI brain / MRA head and neck w/o contrast. Needed admission to shut off ICD for imaging. Family medicine happy to help. Ddx includes stroke/TIA, seizure w/ todd paralysis, hemiplegic migraine. HPI consistent mostly with TIA given complete resolution of symptoms. Will appreciate neurology recommendations and obtain needed risk stratification labs.  -Admit to FPTS, Attending Dr. Gwendlyn Deutscher -Continuous cardiac monitoring -TTE -F/u TSH -F/u B12 -MRI -PT/OT/SLP -Bedside swallow  study -Stroke Education  A o CKD IV Cr 3.51. Baseline 2.8-3.2.  -Continue calcitriol -AM BMP -Avoid nephrotoxic drugs  CHF diastolic and systolic EF 84-16% in 6063. Home medications include furosemide.  -hold home medication in the setting of AKI; restart when able -daily wts -strict I/Os  A fib w/ chronic anticoagulation HR 70. Patient had AV nodal ablation 2019, on Eliquis -Continue eliquis; CT head without acute hemorrhage.   DM2 Last A1c 9.1 on 12/27/20. Patient taking Tresiba 32 units, and novolog 12 units before evening meals. Gabapentin 016 mg, Trulicity 1.5 mg, Jardiance 10 mg -Hold home meds -sSSI -Resume basal when patient is eating regularly  HTN Patient BP on admission is 134/93 Home medications include Losartan 25 mg, Coreg 25 mg, Hydralazine 50 mg -Continue coreg and hydral -Hold ARB in the setting of impaired kidney function  HLD Patient most recent lipid panel 6/23 showed a T. Chol 139, Tri 92, HDL 35, LDL 86.  -Continue Crestor 20 mg  MDD Taking paroxetine 30 mg -Continue home meds  OSA on CPAP Noted per chart review -Consider using CPAP qhs while admitted  GERD Patient taking Prilosec 40 mg  Chronic back pain w/ chronic opioid use Patient taking oxymorphone PO TID -Hold home medication. Patient denies pain at this time.   FEN/GI: Hear healthy-carb mod, Protonix Prophylaxis: Eliquis  Disposition: Tele cardiac, OBV  History of Present Illness:  Anthony [name] is a 70 y.o. male presenting with left leg weakness. Patient blew his nose on Wednesday morning and got a lot of snot out. He was able to breathe better but when he laid down he felt as if fluid was draining in his head down to his neck. When he tried to get up, he did not have any  balance as if he was drunk. Better Thursday but his balance was still off. Friday still had left leg weakness and appeared to drag and bounce up if he tried to pick it up. When he stood up he felt dizzy. Called the  clinic and could not be seen because there were not appointment. Was seen in urgent care and then sent over to ED. Here his neck was initially stiff but is not stiff now. He was concerned about a stroke.   [id number] Tobacco- 2020 Denies other drug use  Review Of Systems: Per HPI with the following additions:   Review of Systems  Constitutional:  Negative for fever.  HENT:  Positive for congestion and rhinorrhea.   Eyes:  Negative for visual disturbance.  Respiratory:  Negative for cough.   Cardiovascular:  Negative for chest pain.  Gastrointestinal:  Negative for abdominal pain, constipation and diarrhea.  Genitourinary:  Negative for difficulty urinating.  Musculoskeletal:  Negative for arthralgias, myalgias and neck pain.  Neurological:  Positive for dizziness, weakness and numbness. Negative for facial asymmetry.    Patient Active Problem List   Diagnosis Date Noted   COVID-19 vaccine administered 12/27/2020   Neck mass 11/16/2020   Erectile dysfunction 05/31/2020   Polypharmacy 05/22/2020   Porokeratosis 04/26/2020   Sprain of foot 01/25/2020   Neck muscle strain 11/22/2019   Patient cannot afford medications 10/18/2019   Hx of adenomatous colonic polyps    S/P ICD (internal cardiac defibrillator) procedure and BiV device,  07/25/17 Medtronic  07/26/2017   NICM (nonischemic cardiomyopathy) ([place]) 07/25/2017   Congestive heart failure, NYHA class 3, chronic, systolic ([place]) 67/67/2094   Atrial fibrillation, permanent ([place]) 07/25/2017   Memory change    Thrombocytopenia ([place])    Right temporal lobe infarction ([place]) 06/13/2017   Seizures ([place]) 06/12/2017   Sebaceous cyst of skin of breast, left 04/25/2017   Chronic kidney disease (CKD), stage IV (severe) ([place]) 01/22/2017   Secondary hyperparathyroidism of renal origin ([place]) 01/22/2017   Chronic anticoagulation 03/06/2016   Chronic, continuous use of opioids 10/18/2014   OSA on CPAP 04/04/2014   History of tobacco abuse  06/28/2013   DIABETIC  RETINOPATHY 02/24/2008   Insulin dependent type 2 diabetes mellitus ([place]) 07/24/2006   HYPERCHOLESTEROLEMIA 07/24/2006   Depression 07/24/2006   HYPERTENSION, BENIGN SYSTEMIC 07/24/2006   Coronary atherosclerosis 07/24/2006   GASTROESOPHAGEAL REFLUX, NO ESOPHAGITIS 07/24/2006    Past Medical History: Past Medical History:  Diagnosis Date   Arthritis    Benign neoplasm of descending colon    Benign neoplasm of sigmoid colon    Benign neoplasm of transverse colon    CAD in native artery    a. reported MI 2000, 2001, s/p stenting of the circumflex lesion extending into the second obtuse marginal branch with a drug-eluting stent in 2003. b. low risk nuc 2015.   Chronic atrial fibrillation (HCC)    Chronic combined systolic and diastolic CHF (congestive heart failure) ([place])    a. Previously diastolic, then EF 70-96% in 10/2016.   CKD (chronic kidney disease), stage III (HCC)    Depression    Diabetes mellitus    Edema of extremities    Erectile dysfunction    GERD (gastroesophageal reflux disease)    Hyperlipidemia    Hypertension    Myocardial infarction ([place]) 2000&2001   Obesity    Onychomycosis    OSA (obstructive sleep apnea)    S/P ICD (internal cardiac defibrillator) procedure and BiV device,  07/25/17 Medtronic  07/26/2017  Seizures ([place])    Sleep apnea    Stroke [facility])    Tubular adenoma of colon 10/2002    Past Surgical History: Past Surgical History:  Procedure Laterality Date   ANGIOPLASTY  2001   stent x 1   AV NODE ABLATION N/A 08/20/2017   Procedure: AV NODE ABLATION;  Surgeon: Deboraha Sprang, MD;  Location: [facility];  Service: Cardiovascular;  Laterality: N/A;   BIV ICD INSERTION CRT-D N/A 07/25/2017   Procedure: BIV ICD INSERTION CRT-D;  Surgeon: Deboraha Sprang, MD;  Location: [facility];  Service: Cardiovascular;  Laterality: N/A;   COLONOSCOPY  2000?   negative   COLONOSCOPY WITH PROPOFOL N/A 02/08/2019   Procedure:  COLONOSCOPY WITH PROPOFOL;  Surgeon: Ladene Artist, MD;  [facility];  Service: Endoscopy;  Laterality: N/A;   FOOT ARTHROTOMY Right    POLYPECTOMY  02/08/2019   Procedure: POLYPECTOMY;  Surgeon: Ladene Artist, MD;  [facility];  Service: Endoscopy;;   TOE AMPUTATION Left 2012    Social History: Social History   Tobacco Use   Smoking status: Former    Packs/day: 0.25    Years: 45.00    Pack years: 11.25    Types: Cigarettes    Start date: 05/28/1967    Quit date: 10/28/2018    Years since quitting: 2.3   Smokeless tobacco: Never  Vaping Use   Vaping Use: Never used  Substance Use Topics   Alcohol use: No    Comment: quit in 1993   Drug use: No   Additional social history: n/a  Please also refer to relevant sections of EMR.  Family History: Family History  Problem Relation Age of Onset   Heart attack Mother    CVA Mother    Diabetes Mother    Heart attack Father    Colon cancer Neg Hx    Stomach cancer Neg Hx    Allergies and Medications: Allergies  Allergen Reactions   Enalapril Maleate Cough   No current facility-administered medications on file prior to encounter.   Current Outpatient Medications on File Prior to Encounter  Medication Sig Dispense Refill   apixaban (ELIQUIS) 5 MG TABS tablet Take 1 tablet (5 mg total) by mouth 2 (two) times daily. 180 tablet 1   calcitRIOL (ROCALTROL) 0.5 MCG capsule Take 0.5 mcg by mouth daily at 12 noon.      carvedilol (COREG) 25 MG tablet Take 1 tablet (25 mg total) by mouth 2 (two) times daily. Please make appt with Dr. Johney Frame (Cardiologist) before anymore refills. Thank you 1st attempt 180 tablet 0   Continuous Blood Gluc Receiver (FREESTYLE LIBRE 14 DAY READER) DEVI Apply topically as directed.     Continuous Blood Gluc Sensor (FREESTYLE LIBRE 14 DAY SENSOR) MISC 1 Device by Does not apply route every 14 (fourteen) days. 2 each 11   Dulaglutide (TRULICITY) 1.5 KZ/6.0FU SOPN Inject 1.5 mg into  the skin once a week. 6 mL 3   empagliflozin (JARDIANCE) 10 MG TABS tablet Take 1 tablet (10 mg total) by mouth daily before breakfast. 90 tablet 2   furosemide (LASIX) 40 MG tablet Take 2 tablets (80 mg total) by mouth daily. 180 tablet 2   gabapentin (NEURONTIN) 300 MG capsule Take 1 capsule (300 mg total) by mouth at bedtime. 90 capsule 3   glucose blood (ONETOUCH VERIO) test strip Use as instructed 100 each 12   hydrALAZINE (APRESOLINE) 50 MG tablet Take 50 mg by  mouth 3 (three) times daily.     insulin aspart (NOVOLOG FLEXPEN) 100 UNIT/ML FlexPen Inject 12 Units into the skin daily before supper. Or largest meal of the day.     insulin degludec (TRESIBA FLEXTOUCH) 200 UNIT/ML FlexTouch Pen Inject 32 Units into the skin daily.     Insulin Pen Needle (PEN NEEDLES) 32G X 4 MM MISC 1 Device by Does not apply route as directed. 200 each 11   KLOR-CON M20 20 MEQ tablet TAKE 1 TABLET BY MOUTH TWICE A DAY 180 tablet 0   Lancets (ONETOUCH ULTRASOFT) lancets Use as instructed 100 each 12   loratadine (CLARITIN) 10 MG tablet Take 10 mg by mouth daily.     losartan (COZAAR) 25 MG tablet TAKE 1 TABLET BY MOUTH EVERY DAY 90 tablet 3   Multiple Vitamin (MULTIVITAMIN) tablet Take 1 tablet by mouth daily.       nitroGLYCERIN (NITROSTAT) 0.4 MG SL tablet Place 1 tablet (0.4 mg total) under the tongue every 5 (five) minutes as needed for chest pain. If no relief by 3rd tab, call 911 25 tablet 3   omeprazole (PRILOSEC) 40 MG capsule TAKE ONE CAPSULE BY MOUTH DAILY BEFORE SUPPER 90 capsule 3   oxymorphone (OPANA) 5 MG tablet Take 5 mg by mouth 3 (three) times daily as needed for pain (max 3 tablets daily). Per Dr Brien Few     PARoxetine (PAXIL) 30 MG tablet TAKE 1 TABLET BY MOUTH EVERY DAY 90 tablet 1   rosuvastatin (CRESTOR) 20 MG tablet TAKE 1 TABLET BY MOUTH EVERY DAY 90 tablet 3   tadalafil (CIALIS) 5 MG tablet Take 1 tablet (5 mg total) by mouth daily as needed for erectile dysfunction. 10 tablet 0    tamsulosin (FLOMAX) 0.4 MG CAPS capsule Take 0.4 mg by mouth.     vitamin B-12 (CYANOCOBALAMIN) 1000 MCG tablet Take 1,000 mcg by mouth daily.      Objective: BP (!) 132/91   Pulse 71   Temp 98.9 F (37.2 C)   Resp 16   SpO2 97%  Exam: General: African American, elderly male, no acute signs of distress Eyes: PERRL, conjunctiva clear ENTM: Moist mucous membranes Neck: Gross range of motion intact Cardiovascular: RRR, nrmg Respiratory: CTABL, no wheezes or stridor Gastrointestinal: Abdomen soft, non-tender, non-distended MSK: Moving all extremities independently Derm: No rashes or lesions Neuro: Strength symmetric and intact, sensation symmetric and intact, no focal deficits Psych: Good mood, polite affect  Labs and Imaging: CBC BMET  Recent Labs  Lab 02/16/21 1820  WBC 7.4  HGB 14.3  HCT 43.0  PLT 133*   Recent Labs  Lab 02/16/21 1820  NA 137  K 3.5  CL 99  CO2 29  BUN 30*  CREATININE 3.51*  GLUCOSE 207*  CALCIUM 9.9     EKG: Paced EKG with PVC   Imaging:  CT Head WO contrast: IMPRESSION: 1. No acute intracranial abnormality. 2. Stable atrophy, chronic small vessel ischemia, and remote infarcts.  CT Cervical Spine WO contrast IMPRESSION: Multilevel degenerative disc disease and facet hypertrophy throughout the cervical spine. No high-grade canal stenosis.     Holley Bouche, MD 02/17/2021, 4:10 AM PGY-1, [place] Intern pager: [phone], text pages welcome

## 2021-02-17 NOTE — Progress Notes (Signed)
  Echocardiogram 2D Echocardiogram has been performed.  Anthony [name] 02/17/2021, 1:07 PM

## 2021-02-17 NOTE — ED Notes (Signed)
Speech at bedside

## 2021-02-17 NOTE — ED Notes (Signed)
PT at bedside walking with patient.

## 2021-02-17 NOTE — Hospital Course (Addendum)
Anthony [name] is a 70 y.o. male presenting with left sided leg weakness, with left sideded headache and neck pain . PMH is significant for A. Fib, CHF (diastolic and systolic, EF 46-28%), s/p AICD (Medtronic device placed 2019), CAD s/p stent placement, Seizures, CVA, CKD IV, HTN, HLD, DM2, CAD, MDD, OSA, and GERD. His hospital course is detailed below, by problem.   Left Leg Weakness  Dizziness  TIA Patient blew his nose and started having left sided headache, neck pain, and dizziness. Patient also appreciated left leg weakness that has slowly improved overtime. Labs showed PLT 133, Glc 207, BUN 30, Cr 3.51, Trop x2 (25, 26), B12 was 4,025. CT Head showed no acute intracranial abnormality, with stable atrophy, chronic small vessel ischemia, and remote infarcts. CT Cervical spine showed multilevel degenerative disc disease and facet hypertrophy throughout cervical spine. No high-grade canal stenosis. Patient was admitted for need of a MRI, which was delayed because no one onsite could switch his is ICD into safe mode. Patient also received Echocardiogram demonstrating EF 60-65%. Carotid U/S was also performed demonstrating near normal exam with minimal wall thickening or plaque. Patient remained stable in the hospital, awaiting MRI. On physical exam patient had symmetric strength and sensation by day 1 of admission, with no FND. Neurology was consulted and decided that given clinical picture and history, patient no longer needed MRI, and was cleared to go home on Eliquis 5 mg BID. Neurology believed the event was secondary to a change in intracranial pressure while blowing nose.     All other conditions were chronic and stable.   Issues to follow up Stop taking B12 on discharge, only restart after re-evaluating with PCP  Jardiance and Losartan stopped during admission, consider restarting in outpatient setting.

## 2021-02-17 NOTE — Progress Notes (Addendum)
Spoke with MRI.  Patient unable to have ordered MRI head as no staff on weekends to turn pacemaker to safety mode. MRI will not be able to be completed until Monday. Messaged consulting Neurologist for recommendations if wanting to proceed with carotid u/s and repeat CT head.    Carollee Leitz, MD Family Medicine Residency

## 2021-02-17 NOTE — ED Provider Notes (Signed)
Emergency Department Provider Note   I have reviewed the triage vital signs and the nursing notes.   HISTORY  Chief Complaint Dizziness and Weakness   HPI Anthony [name] is a 70 y.o. male with past medical history reviewed below including diabetes, CKD, A. Fib on Eliquis presents to the emergency department with acute onset left leg numbness/weakness, gait instability, dizziness.  Symptoms began 3 days ago.  He denies any symptoms in the arm or face.  When symptoms began he had some discomfort in the left side of his head radiating down into the neck but those symptoms have resolved.  He denies specific vertigo feeling but does feel unsteady, especially with walking attributing this to his left leg symptoms.  He is not having any mid or lower back discomfort.  No groin numbness.  No difficulty with bowel/bladder. He does have a pacemaker.   Past Medical History:  Diagnosis Date   Arthritis    Benign neoplasm of descending colon    Benign neoplasm of sigmoid colon    Benign neoplasm of transverse colon    CAD in native artery    a. reported MI 2000, 2001, s/p stenting of the circumflex lesion extending into the second obtuse marginal branch with a drug-eluting stent in 2003. b. low risk nuc 2015.   Chronic atrial fibrillation (HCC)    Chronic combined systolic and diastolic CHF (congestive heart failure) ([place])    a. Previously diastolic, then EF 68-12% in 10/2016.   CKD (chronic kidney disease), stage III (HCC)    Depression    Diabetes mellitus    Edema of extremities    Erectile dysfunction    GERD (gastroesophageal reflux disease)    Hyperlipidemia    Hypertension    Myocardial infarction ([place]) 2000&2001   Obesity    Onychomycosis    OSA (obstructive sleep apnea)    S/P ICD (internal cardiac defibrillator) procedure and BiV device,  07/25/17 Medtronic  07/26/2017   Seizures ([place])    Sleep apnea    Stroke ([facility])    Tubular adenoma of colon 10/2002    Patient Active Problem  List   Diagnosis Date Noted   Left leg weakness 02/17/2021   COVID-19 vaccine administered 12/27/2020   Neck mass 11/16/2020   Erectile dysfunction 05/31/2020   Polypharmacy 05/22/2020   Porokeratosis 04/26/2020   Sprain of foot 01/25/2020   Neck muscle strain 11/22/2019   Patient cannot afford medications 10/18/2019   Hx of adenomatous colonic polyps    S/P ICD (internal cardiac defibrillator) procedure and BiV device,  07/25/17 Medtronic  07/26/2017   NICM (nonischemic cardiomyopathy) ([place]) 07/25/2017   Congestive heart failure, NYHA class 3, chronic, systolic ([place]) 75/17/0017   Atrial fibrillation, permanent ([place]) 07/25/2017   Memory change    Thrombocytopenia ([place])    Right temporal lobe infarction ([place]) 06/13/2017   Acute kidney injury superimposed on chronic kidney disease ([place]) 06/13/2017   Seizures ([employer]) 06/12/2017   Sebaceous cyst of skin of breast, left 04/25/2017   Chronic kidney disease (CKD), stage IV (severe) ([place]) 01/22/2017   Secondary hyperparathyroidism of renal origin ([place]) 01/22/2017   Chronic anticoagulation 03/06/2016   Chronic, continuous use of opioids 10/18/2014   OSA on CPAP 04/04/2014   History of tobacco abuse 06/28/2013   DIABETIC  RETINOPATHY 02/24/2008   Insulin dependent type 2 diabetes mellitus ([place]) 07/24/2006   HYPERCHOLESTEROLEMIA 07/24/2006   Depression 07/24/2006   HYPERTENSION, BENIGN SYSTEMIC 07/24/2006   Coronary atherosclerosis 07/24/2006   GASTROESOPHAGEAL  REFLUX, NO ESOPHAGITIS 07/24/2006    Past Surgical History:  Procedure Laterality Date   ANGIOPLASTY  2001   stent x 1   AV NODE ABLATION N/A 08/20/2017   Procedure: AV NODE ABLATION;  Surgeon: Deboraha Sprang, MD;  Location: [facility];  Service: Cardiovascular;  Laterality: N/A;   BIV ICD INSERTION CRT-D N/A 07/25/2017   Procedure: BIV ICD INSERTION CRT-D;  Surgeon: Deboraha Sprang, MD;  Location: [facility];  Service: Cardiovascular;  Laterality: N/A;    COLONOSCOPY  2000?   negative   COLONOSCOPY WITH PROPOFOL N/A 02/08/2019   Procedure: COLONOSCOPY WITH PROPOFOL;  Surgeon: Ladene Artist, MD;  Location: [facility];  Service: Endoscopy;  Laterality: N/A;   FOOT ARTHROTOMY Right    POLYPECTOMY  02/08/2019   Procedure: POLYPECTOMY;  Surgeon: Ladene Artist, MD;  Location: [facility];  Service: Endoscopy;;   TOE AMPUTATION Left 2012    Allergies Enalapril maleate  Family History  Problem Relation Age of Onset   Heart attack Mother    CVA Mother    Diabetes Mother    Heart attack Father    Colon cancer Neg Hx    Stomach cancer Neg Hx     Social History Social History   Tobacco Use   Smoking status: Former    Packs/day: 0.25    Years: 45.00    Pack years: 11.25    Types: Cigarettes    Start date: 05/28/1967    Quit date: 10/28/2018    Years since quitting: 2.3   Smokeless tobacco: Never  Vaping Use   Vaping Use: Never used  Substance Use Topics   Alcohol use: No    Comment: quit in 1993   Drug use: No    Review of Systems  Constitutional: No fever/chills Eyes: No visual changes. ENT: No sore throat. Cardiovascular: Denies chest pain. Respiratory: Denies shortness of breath. Gastrointestinal: No abdominal pain.  No nausea, no vomiting.  No diarrhea.  No constipation. Genitourinary: Negative for dysuria. Musculoskeletal: Negative for back pain. Skin: Negative for rash. Neurological: Positive left leg numbness/weakness. Positive HA and neck pain at the time of onset, now resolved.   10-point ROS otherwise negative.  ____________________________________________   PHYSICAL EXAM:  VITAL SIGNS: ED Triage Vitals  Enc Vitals Group     BP 02/16/21 1801 108/73     Pulse Rate 02/16/21 1801 74     Resp 02/16/21 1801 18     Temp 02/16/21 1801 98.9 F (37.2 C)     Temp src --      SpO2 02/16/21 1801 100 %    Constitutional: Alert and oriented. Well appearing and in no acute distress. Eyes: Conjunctivae are  normal. PERRL. Head: Atraumatic. Nose: No congestion/rhinnorhea. Mouth/Throat: Mucous membranes are moist.   Neck: No stridor.   Cardiovascular: Normal rate, regular rhythm. Good peripheral circulation. Grossly normal heart sounds.   Respiratory: Normal respiratory effort.  No retractions. Lungs CTAB. Gastrointestinal: Soft and nontender. No distention.  Musculoskeletal: No lower extremity tenderness nor edema. No gross deformities of extremities. Neurologic:  Normal speech and language.  No facial asymmetry.  Normal strength and sensation of bilateral upper extremities with no drift.  Patient with 4+/5 strength in the left leg especially with hip flexion and some left lower extremity drift noted.  Normal sensation in the bilateral legs.  Skin:  Skin is warm, dry and intact. No rash noted.   ____________________________________________   LABS (all labs ordered are listed,  but only abnormal results are displayed)  Labs Reviewed  CBC WITH DIFFERENTIAL/PLATELET - Abnormal; Notable for the following components:      Result Value   Platelets 133 (*)    All other components within normal limits  COMPREHENSIVE METABOLIC PANEL - Abnormal; Notable for the following components:   Glucose, Bld 207 (*)    BUN 30 (*)    Creatinine, Ser 3.51 (*)    GFR, Estimated 18 (*)    All other components within normal limits  RENAL FUNCTION PANEL - Abnormal; Notable for the following components:   Glucose, Bld 225 (*)    BUN 31 (*)    Creatinine, Ser 3.31 (*)    Albumin 3.2 (*)    GFR, Estimated 19 (*)    All other components within normal limits  VITAMIN B12 - Abnormal; Notable for the following components:   Vitamin B-12 4,025 (*)    All other components within normal limits  GLUCOSE, CAPILLARY - Abnormal; Notable for the following components:   Glucose-Capillary 151 (*)    All other components within normal limits  CBC - Abnormal; Notable for the following components:   Platelets 119 (*)    All  other components within normal limits  BASIC METABOLIC PANEL - Abnormal; Notable for the following components:   Potassium 3.4 (*)    Glucose, Bld 174 (*)    BUN 30 (*)    Creatinine, Ser 3.21 (*)    GFR, Estimated 20 (*)    All other components within normal limits  GLUCOSE, CAPILLARY - Abnormal; Notable for the following components:   Glucose-Capillary 244 (*)    All other components within normal limits  GLUCOSE, CAPILLARY - Abnormal; Notable for the following components:   Glucose-Capillary 180 (*)    All other components within normal limits  GLUCOSE, CAPILLARY - Abnormal; Notable for the following components:   Glucose-Capillary 122 (*)    All other components within normal limits  GLUCOSE, CAPILLARY - Abnormal; Notable for the following components:   Glucose-Capillary 208 (*)    All other components within normal limits  BASIC METABOLIC PANEL - Abnormal; Notable for the following components:   Glucose, Bld 201 (*)    BUN 33 (*)    Creatinine, Ser 3.39 (*)    GFR, Estimated 19 (*)    All other components within normal limits  CBC - Abnormal; Notable for the following components:   Platelets 113 (*)    All other components within normal limits  GLUCOSE, CAPILLARY - Abnormal; Notable for the following components:   Glucose-Capillary 205 (*)    All other components within normal limits  CBC - Abnormal; Notable for the following components:   HCT 38.9 (*)    Platelets 105 (*)    All other components within normal limits  BASIC METABOLIC PANEL - Abnormal; Notable for the following components:   Glucose, Bld 220 (*)    BUN 33 (*)    Creatinine, Ser 3.35 (*)    GFR, Estimated 19 (*)    All other components within normal limits  GLUCOSE, CAPILLARY - Abnormal; Notable for the following components:   Glucose-Capillary 200 (*)    All other components within normal limits  CBG MONITORING, ED - Abnormal; Notable for the following components:   Glucose-Capillary 206 (*)    All  other components within normal limits  CBG MONITORING, ED - Abnormal; Notable for the following components:   Glucose-Capillary 222 (*)    All other components  within normal limits  TROPONIN I (HIGH SENSITIVITY) - Abnormal; Notable for the following components:   Troponin I (High Sensitivity) 25 (*)    All other components within normal limits  TROPONIN I (HIGH SENSITIVITY) - Abnormal; Notable for the following components:   Troponin I (High Sensitivity) 26 (*)    All other components within normal limits  RESP PANEL BY RT-PCR (FLU A&B, COVID) ARPGX2  MRSA NEXT GEN BY PCR, NASAL  HIV ANTIBODY (ROUTINE TESTING W REFLEX)  TSH  RPR   ____________________________________________  EKG   EKG Interpretation  Date/Time:  Friday February 16 2021 18:03:54 EDT Ventricular Rate:  73 PR Interval:    QRS Duration: 158 QT Interval:  466 QTC Calculation: 513 R Axis:   122 Text Interpretation: Ventricular-paced rhythm with occasional Premature ventricular complexes Abnormal ECG Confirmed by Nanda Quinton [phone]) on 02/17/2021 2:29:00 AM        ____________________________________________  RADIOLOGY  VAS US CAROTID  Result Date: 02/18/2021 Carotid Arterial Duplex Study Patient Name:  Anthony [name]  Date of Exam:   02/18/2021 Medical Rec #: [id number]        Accession #:    [id number] Date of Birth: 05-Sep-1950         Patient Gender: M Patient Age:   81 years Exam Location:  [facility] Procedure:      VAS US CAROTID Referring Phys: Andrena Mews --------------------------------------------------------------------------------  Indications:      Dizziness, weakness. Risk Factors:     Hypertension, hyperlipidemia, Diabetes, coronary artery                   disease. Other Factors:    CKD IV, CHF, Atrial fibrillation, ICD, OSA. Comparison Study: No prior study Performing Technologist: Sharion Dove RVS  Examination Guidelines: A complete evaluation includes B-mode imaging, spectral Doppler,  color Doppler, and power Doppler as needed of all accessible portions of each vessel. Bilateral testing is considered an integral part of a complete examination. Limited examinations for reoccurring indications may be performed as noted.  Right Carotid Findings: +----------+--------+--------+--------+------------------+------------------+           PSV cm/sEDV cm/sStenosisPlaque DescriptionComments           +----------+--------+--------+--------+------------------+------------------+ CCA Prox  49      13                                intimal thickening +----------+--------+--------+--------+------------------+------------------+ CCA Distal61      14              heterogenous                         +----------+--------+--------+--------+------------------+------------------+ ICA Prox  49      17              heterogenous                         +----------+--------+--------+--------+------------------+------------------+ ICA Distal27      10                                                   +----------+--------+--------+--------+------------------+------------------+ ECA       58      5                                                    +----------+--------+--------+--------+------------------+------------------+ +----------+--------+-------+--------+-------------------+  PSV cm/sEDV cmsDescribeArm Pressure (mmHG) +----------+--------+-------+--------+-------------------+ [id number]                                         +----------+--------+-------+--------+-------------------+ +---------+--------+--+--------+--+ VertebralPSV cm/s55EDV cm/s23 +---------+--------+--+--------+--+  Left Carotid Findings: +----------+--------+--------+--------+------------------+------------------+           PSV cm/sEDV cm/sStenosisPlaque DescriptionComments           +----------+--------+--------+--------+------------------+------------------+ CCA Prox   70      21                                intimal thickening +----------+--------+--------+--------+------------------+------------------+ CCA Distal60      15              heterogenous                         +----------+--------+--------+--------+------------------+------------------+ ICA Prox  57      20              heterogenous                         +----------+--------+--------+--------+------------------+------------------+ ICA Distal32      12                                                   +----------+--------+--------+--------+------------------+------------------+ ECA       36      10                                                   +----------+--------+--------+--------+------------------+------------------+ +----------+--------+--------+--------+-------------------+           PSV cm/sEDV cm/sDescribeArm Pressure (mmHG) +----------+--------+--------+--------+-------------------+ Subclavian70                                          +----------+--------+--------+--------+-------------------+ +---------+--------+--+--------+-+ VertebralPSV cm/s29EDV cm/s6 +---------+--------+--+--------+-+   Summary: Right Carotid: The extracranial vessels were near-normal with only minimal wall                thickening or plaque. Left Carotid: The extracranial vessels were near-normal with only minimal wall               thickening or plaque. Vertebrals:  Bilateral vertebral arteries demonstrate antegrade flow. Subclavians: Normal flow hemodynamics were seen in bilateral subclavian              arteries. *See table(s) above for measurements and observations.     Preliminary     ____________________________________________   PROCEDURES  Procedure(s) performed:   Procedures   ____________________________________________   INITIAL IMPRESSION / ASSESSMENT AND PLAN / ED COURSE  Pertinent labs & imaging results that were available during my care of the  patient were reviewed by me and considered in my medical decision making (see chart for details).   Patient presents to the emergency department with left leg weakness with some subjective numbness although on exam no sensory deficit.  He does  seem somewhat weak with movement of his left leg.  Symptoms began with some discomfort in the head and neck.  After MSE process, initial plan was for MRI/MRA of the head and neck although patient has a pacemaker and so cannot get MRI at this time.  It does appear compatible and note that this may be able to be completed during the daytime.  CT imaging of the head does not show acute infarct or bleed.  Patient does have some baseline CKD and so not a good candidate for CTA of the head or neck.  Overall lower suspicion for vertebral artery dissection given his exam here and lack of current cerebellar symptoms/exam findings.   Discussed case with Dr. Theda Sers with Neurology. Plan for consultation and MRI in the AM when able to coordinate with the patient's pacemaker.   Discussed patient's case with Family Medicine to request admission. Patient and family (if present) updated with plan. Care transferred to [facility] Medicine service.  I reviewed all nursing notes, vitals, pertinent old records, EKGs, labs, imaging (as available).  ____________________________________________  FINAL CLINICAL IMPRESSION(S) / ED DIAGNOSES  Final diagnoses:  Left leg weakness     MEDICATIONS GIVEN DURING THIS VISIT:  Medications  carvedilol (COREG) tablet 25 mg (25 mg Oral Given 02/18/21 2117)  hydrALAZINE (APRESOLINE) tablet 50 mg (50 mg Oral Given 02/18/21 2117)  rosuvastatin (CRESTOR) tablet 20 mg (20 mg Oral Given 02/18/21 1042)  PARoxetine (PAXIL) tablet 30 mg (30 mg Oral Given 02/18/21 1042)  calcitRIOL (ROCALTROL) capsule 0.5 mcg (0.5 mcg Oral Given 02/18/21 1702)  pantoprazole (PROTONIX) EC tablet 80 mg (80 mg Oral Given 02/18/21 1042)  tamsulosin (FLOMAX) capsule 0.4 mg (0.4  mg Oral Given 02/18/21 1042)  apixaban (ELIQUIS) tablet 5 mg (5 mg Oral Given 02/18/21 2117)  gabapentin (NEURONTIN) capsule 300 mg (300 mg Oral Given 02/18/21 2117)  insulin aspart (novoLOG) injection 0-9 Units (2 Units Subcutaneous Given 02/19/21 0638)  morphine (MSIR) tablet 15 mg (15 mg Oral Given 02/18/21 0655)  potassium chloride SA (KLOR-CON) CR tablet 20 mEq (20 mEq Oral Given 02/18/21 2117)    Note:  This document was prepared using Dragon voice recognition software and may include unintentional dictation errors.  Nanda Quinton, MD, [facility] Emergency Medicine    Lexianna Weinrich, [facility], MD 02/19/21 [phone]

## 2021-02-17 NOTE — Evaluation (Signed)
Occupational Therapy Evaluation Patient Details Name: Anthony [name] MRN: [id number] DOB: 03-27-1951 Today's Date: 02/17/2021   History of Present Illness 70 yo male with onset of L side weakness and numbness on both upper and lower extremities, L side HA,  was noted to be dizzy, and after three days of symptoms came to ED on 9/23.  Started using L ankle brace and SPC at home, but pt sees improvement on LLE strength, sensation and dizziness.  CT was negative for an acute change.  MRI pending.  PMHx: CVA, TIA, seizure with todd paralysis, hemiplegic migraine, MI, HTN, CKD3, a-fib, CAD, colon neoplasm, OSA, seizures, ICD and BiV device   Clinical Impression   Anthony [name] was indep in all ADLs and mobility prior to the above admission. He lives alone with 8 STE, and states that his daughter, son or friend can provide support at d/c. Pt was min guard for all mobility within the room with [facility] for safety one, no apparent LOB and good safety awareness. He is requiring up to min guard for ADLs for safety due to LLE weakness. Educated pt to have 24/7 supervision initially upon d/c, and he verbalized understanding. Pt does not require further OT acutely. Recommend d/c tp home with direct assistance for all ADLs and mobility.      Recommendations for follow up therapy are one component of a multi-disciplinary discharge planning process, led by the attending physician.  Recommendations may be updated based on patient status, additional functional criteria and insurance authorization.   Follow Up Recommendations  No OT follow up;Supervision/Assistance - 24 hour    Equipment Recommendations  None recommended by OT       Precautions / Restrictions Precautions Precautions: Fall Precaution Comments: has SPC from home Required Braces or Orthoses: Other Brace Other Brace: pt brought a R ankle support from home Restrictions Weight Bearing Restrictions: No      Mobility Bed Mobility Overal bed mobility: Needs  Assistance Bed Mobility: Supine to Sit;Sit to Supine     Supine to sit: Supervision Sit to supine: Supervision   General bed mobility comments: superivsion for safety only due to elevated bed height    Transfers Overall transfer level: Needs assistance Equipment used: Straight cane Transfers: Sit to/from Stand Sit to Stand: Min guard         General transfer comment: min guard for safety only, good safety awareness with lines    Balance Overall balance assessment: Needs assistance Sitting-balance support: Feet supported Sitting balance-Leahy Scale: Good       Standing balance-Leahy Scale: Fair Standing balance comment: less than fair dynamically                           ADL either performed or assessed with clinical judgement   ADL Overall ADL's : Needs assistance/impaired Eating/Feeding: Independent;Sitting   Grooming: Standing;Supervision/safety   Upper Body Bathing: Supervision/ safety;Sitting   Lower Body Bathing: Supervison/ safety;Sit to/from stand   Upper Body Dressing : Supervision/safety;Sitting   Lower Body Dressing: Sit to/from stand;Min guard   Toilet Transfer: Supervision/safety;Ambulation (SPC)   Toileting- Clothing Manipulation and Hygiene: Supervision/safety;Sitting/lateral lean       Functional mobility during ADLs: Min guard;Cane General ADL Comments: pt min guard-supervision for all tasks assessed with SPC; noted some abnormal gait with L leg. pt described it as "catching." no apparent LOB     Vision Baseline Vision/History: 1 Wears glasses Patient Visual Report: No change from baseline Vision Assessment?: No  apparent visual deficits            Pertinent Vitals/Pain Pain Assessment: No/denies pain     Hand Dominance Right   Extremity/Trunk Assessment Upper Extremity Assessment Upper Extremity Assessment: Overall WFL for tasks assessed   Lower Extremity Assessment Lower Extremity Assessment: Defer to PT  evaluation   Cervical / Trunk Assessment Cervical / Trunk Assessment: Normal   Communication Communication Communication: No difficulties   Cognition Arousal/Alertness: Awake/alert Behavior During Therapy: WFL for tasks assessed/performed Overall Cognitive Status: Within Functional Limits for tasks assessed             General Comments: answering questions appropriately   General Comments  VSS on RA; monitor showed vtach once sitting EOB.    Exercises Exercises: Other exercises (coordination for knees and ankles WFL B, strength is 4- L hip and 4 R hip but WFL otherwise)   Shoulder Instructions      Home Living Family/patient expects to be discharged to:: Private residence Living Arrangements: Alone Available Help at Discharge: Friend(s);Family (Pt reports his son, daughter or a friend can provided support at d/c) Type of Home: House Home Access: Stairs to enter Technical brewer of Steps: 8 Entrance Stairs-Rails: Can reach both Home Layout: One level         Biochemist, clinical: [place]: Cane - single point          Prior Functioning/Environment Level of Independence: Independent        Comments: did not need AD prior to this event        OT Problem List: Decreased strength;Decreased range of motion;Impaired balance (sitting and/or standing);Decreased activity tolerance;Decreased safety awareness;Decreased knowledge of use of DME or AE;Pain      OT Treatment/Interventions:      OT Goals(Current goals can be found in the care plan section) Acute Rehab OT Goals Patient Stated Goal: to go home today OT Goal Formulation: All assessment and education complete, DC therapy Time For Goal Achievement: 03/03/21 Potential to Achieve Goals: Fair   AM-PAC OT "6 Clicks" Daily Activity     Outcome Measure Help from another person eating meals?: None Help from another person taking care of personal grooming?: A Little Help from another  person toileting, which includes using toliet, bedpan, or urinal?: A Little Help from another person bathing (including washing, rinsing, drying)?: A Little Help from another person to put on and taking off regular upper body clothing?: None Help from another person to put on and taking off regular lower body clothing?: A Little 6 Click Score: 20   End of Session Equipment Utilized During Treatment: Gait belt [facility]) Nurse Communication: Mobility status  Activity Tolerance: Patient tolerated treatment well Patient left: in bed;with call bell/phone within reach  OT Visit Diagnosis: Unsteadiness on feet (R26.81);Other abnormalities of gait and mobility (R26.89);Pain;Muscle weakness (generalized) (M62.81)                Time: [id number] OT Time Calculation (min): 16 min Charges:  OT General Charges $OT Visit: 1 Visit OT Evaluation $OT Eval Moderate Complexity: 1 Mod  Wilma Michaelson A Raya Mckinstry 02/17/2021, 2:08 PM

## 2021-02-17 NOTE — Evaluation (Addendum)
Physical Therapy Evaluation Patient Details Name: Anthony [name] MRN: [id number] DOB: [birth year] Today's Date: 02/17/2021  History of Present Illness  70 yo male with onset of L side weakness and numbness on both upper and lower extremities, L side HA,  was noted to be dizzy, and after three days of symptoms came to ED on 9/23.  Started using L ankle brace and SPC at home, but pt sees improvement on LLE strength, sensation and dizziness.  CT was negative for an acute change.  MRI pending.  PMHx: CVA, TIA, seizure with todd paralysis, hemiplegic migraine, MI, HTN, CKD3, a-fib, CAD, colon neoplasm, OSA, seizures, ICD and BiV device  Clinical Impression  Pt was seen for initial mobiltiy assessment and note that the [facility] he brought from home is working well to balance with gait after height adjustment done. He is making the effort to walk with both no cane and with cane, and feels comfortable with the result of using it.   Follow along with him to monitor the result of his MRI, and did note that while he has balance change with no SPC to walk, he has very minor L hip strength change, normal sensation of LLE and no foot or knee coordination changes.  Requesting HHPT to follow up and then can continue outpatient if needed.       Recommendations for follow up therapy are one component of a multi-disciplinary discharge planning process, led by the attending physician.  Recommendations may be updated based on patient status, additional functional criteria and insurance authorization.  Follow Up Recommendations Home health PT;Supervision for mobility/OOB    Equipment Recommendations  None recommended by PT    Recommendations for Other Services       Precautions / Restrictions Precautions Precautions: Fall Precaution Comments: has SPC from home Required Braces or Orthoses: Other Brace Other Brace: pt brought a R ankle support from home Restrictions Weight Bearing Restrictions: No      Mobility   Bed Mobility Overal bed mobility: Needs Assistance Bed Mobility: Supine to Sit;Sit to Supine     Supine to sit: Min guard;Min assist Sit to supine: Min guard   General bed mobility comments: minor help for safety and lines    Transfers Overall transfer level: Needs assistance Equipment used: 1 person hand held assist Transfers: Sit to/from Stand Sit to Stand: Min guard         General transfer comment: stands with SPC and PT assisting lines  Ambulation/Gait Ambulation/Gait assistance: Min guard Gait Distance (Feet): 150 Feet Assistive device: 1 person hand held assist;Straight cane Gait Pattern/deviations: Step-through pattern;Decreased stride length;Wide base of support Gait velocity: reduced Gait velocity interpretation: <1.31 ft/sec, indicative of household ambulator General Gait Details: pt is mildly unsteady without cane but with cane is S level  Financial trader Rankin (Stroke Patients Only)       Balance Overall balance assessment: Needs assistance Sitting-balance support: Feet supported Sitting balance-Leahy Scale: Good       Standing balance-Leahy Scale: Fair Standing balance comment: less than fair dynamically                             Pertinent Vitals/Pain Pain Assessment: No/denies pain    Home Living Family/patient expects to be discharged to:: Private residence Living Arrangements: Alone Available Help at Discharge: Other (Comment) (pt is not giving a definite  answer) Type of Home: House Home Access: Stairs to enter Entrance Stairs-Rails: Can reach both Entrance Stairs-Number of Steps: 8 Home Layout: One level Home Equipment: Cane - single point      Prior Function Level of Independence: Independent         Comments: did not need AD prior to this event     Hand Dominance   Dominant Hand: Right    Extremity/Trunk Assessment   Upper Extremity Assessment Upper Extremity  Assessment: Overall WFL for tasks assessed    Lower Extremity Assessment Lower Extremity Assessment: Generalized weakness (in the hips)    Cervical / Trunk Assessment Cervical / Trunk Assessment: Normal  Communication   Communication: No difficulties  Cognition Arousal/Alertness: Awake/alert Behavior During Therapy: WFL for tasks assessed/performed Overall Cognitive Status: Within Functional Limits for tasks assessed                                 General Comments: answering questions appropriately      General Comments General comments (skin integrity, edema, etc.): pt is up to side of bed with good effort, then walked S level with SPC.  Requires that support and tends to lose balance to L a bit unless supporting on cane    Exercises     Assessment/Plan    PT Assessment Patient needs continued PT services  PT Problem List Decreased strength;Decreased balance;Decreased activity tolerance       PT Treatment Interventions DME instruction;Gait training;Stair training;Functional mobility training;Therapeutic activities;Therapeutic exercise;Balance training;Neuromuscular re-education;Patient/family education    PT Goals (Current goals can be found in the Care Plan section)  Acute Rehab PT Goals Patient Stated Goal: to go home today PT Goal Formulation: With patient Time For Goal Achievement: 02/24/21 Potential to Achieve Goals: Good    Frequency Min 3X/week   Barriers to discharge Inaccessible home environment;Decreased caregiver support home alone with 8 stairs    Co-evaluation               AM-PAC PT "6 Clicks" Mobility  Outcome Measure Help needed turning from your back to your side while in a flat bed without using bedrails?: None Help needed moving from lying on your back to sitting on the side of a flat bed without using bedrails?: None Help needed moving to and from a bed to a chair (including a wheelchair)?: A Little Help needed standing  up from a chair using your arms (e.g., wheelchair or bedside chair)?: A Little Help needed to walk in hospital room?: A Little Help needed climbing 3-5 steps with a railing? : A Lot 6 Click Score: 19    End of Session Equipment Utilized During Treatment: Gait belt Activity Tolerance: Patient tolerated treatment well Patient left: in bed;with call bell/phone within reach Nurse Communication: Mobility status PT Visit Diagnosis: Unsteadiness on feet (R26.81);Muscle weakness (generalized) (M62.81)    Time: 1010-1036 PT Time Calculation (min) (ACUTE ONLY): 26 min   Charges:   PT Evaluation $PT Eval Moderate Complexity: 1 Mod PT Treatments $Gait Training: 8-22 mins       Ramond Dial 02/17/2021, 12:58 PM  Mee Hives, PT MS Acute Rehab Dept. Number: [place] [facility] [place]

## 2021-02-18 ENCOUNTER — Observation Stay (HOSPITAL_BASED_OUTPATIENT_CLINIC_OR_DEPARTMENT_OTHER): Payer: Medicare Other

## 2021-02-18 DIAGNOSIS — I639 Cerebral infarction, unspecified: Secondary | ICD-10-CM | POA: Diagnosis not present

## 2021-02-18 DIAGNOSIS — G459 Transient cerebral ischemic attack, unspecified: Secondary | ICD-10-CM | POA: Diagnosis not present

## 2021-02-18 DIAGNOSIS — R29898 Other symptoms and signs involving the musculoskeletal system: Secondary | ICD-10-CM | POA: Diagnosis not present

## 2021-02-18 LAB — CBC
HCT: 39.8 % (ref 39.0–52.0)
Hemoglobin: 13.3 g/dL (ref 13.0–17.0)
MCH: 29.4 pg (ref 26.0–34.0)
MCHC: 33.4 g/dL (ref 30.0–36.0)
MCV: 88.1 fL (ref 80.0–100.0)
Platelets: 119 10*3/uL — ABNORMAL LOW (ref 150–400)
RBC: 4.52 MIL/uL (ref 4.22–5.81)
RDW: 13.9 % (ref 11.5–15.5)
WBC: 7.5 10*3/uL (ref 4.0–10.5)
nRBC: 0 % (ref 0.0–0.2)

## 2021-02-18 LAB — BASIC METABOLIC PANEL
Anion gap: 9 (ref 5–15)
BUN: 30 mg/dL — ABNORMAL HIGH (ref 8–23)
CO2: 26 mmol/L (ref 22–32)
Calcium: 9.8 mg/dL (ref 8.9–10.3)
Chloride: 102 mmol/L (ref 98–111)
Creatinine, Ser: 3.21 mg/dL — ABNORMAL HIGH (ref 0.61–1.24)
GFR, Estimated: 20 mL/min — ABNORMAL LOW (ref >60)
Glucose, Bld: 174 mg/dL — ABNORMAL HIGH (ref 70–99)
Potassium: 3.4 mmol/L — ABNORMAL LOW (ref 3.5–5.1)
Sodium: 137 mmol/L (ref 135–145)

## 2021-02-18 LAB — GLUCOSE, CAPILLARY
Glucose-Capillary: 180 mg/dL — ABNORMAL HIGH (ref 70–99)
Glucose-Capillary: 122 mg/dL — ABNORMAL HIGH (ref 70–99)
Glucose-Capillary: 208 mg/dL — ABNORMAL HIGH (ref 70–99)
Glucose-Capillary: 205 mg/dL — ABNORMAL HIGH (ref 70–99)

## 2021-02-18 LAB — RPR: RPR Ser Ql: NONREACTIVE

## 2021-02-18 MED ORDER — POTASSIUM CHLORIDE CRYS ER 20 MEQ PO TBCR
20.0000 meq | EXTENDED_RELEASE_TABLET | Freq: Two times a day (BID) | ORAL | Status: AC
  Administered 2021-02-18 (×2): 20 meq via ORAL
  Filled 2021-02-18 (×2): qty 1

## 2021-02-18 NOTE — Progress Notes (Signed)
Family Medicine Teaching Service Daily Progress Note Intern Pager: [phone]  Patient name: Anthony [name] Medical record number: [id number] Date of birth: Dec 08, 1950 Age: 70 y.o. Gender: male  Primary Care Provider: Lattie Haw, MD Consultants: Nephrology Code Status: Full  Pt Overview and Major Events to Date:  Patient Admitted - 02/17/21  Assessment and Plan: PRATYUSH AMMON is a 70 y.o. male presenting with 70 y.o. male presenting with left sided leg weakness, with left sideded headache and neck pain . PMH is significant for A. Fib, CHF (diastolic and systolic, EF 73-71%), s/p AICD (Medtronic device placed 2019), CAD s/p stent placement, Seizures, CVA, CKD IV, HTN, HLD, DM2, CAD, MDD, OSA, and GERD.   Left Leg Weakness  Dizziness Patient received CT Head WO contrast yesterday. Findings demonstrated no acute intracranial hemorrhage or infarct, stable remote left frontal cortical and scattered lacunar infarcts, mild paranasal disease, stable from last CT Head. Patient also had TTE yesterday that was wnl, EF 60-65%, with some mitral valve calcification. Patient has carotid US scheduled for today.  -MRI card, angio head, angio neck for 9/26 -Follow-up Carotid US -Continuous cardiac monitoring -TSH: 0.935 - wnl -B12: 4,025 - elevated -PT/OT/SLP -Stroke education  A o CKD IV CR 3.21 today, 3.51 on admission, baseline 2.8-3.2. -Continue calcitriol -AM BMP -Avoid nephrotoxic agents  Chronic back pain w/ chronic opioid use Patient given MSIR 15 mg x1 this am 0700. Patient taking oxymorphone 10-15 mg TID at home. -Consider PRN pain medicine   CHF diastolic and systolic EF 25 to 06% in 6969. Patient wt 98.7 kg today. Home medications include furosemide. -Hold home meds in setting of AKI. -Daily weights -Strict I's and O's  A fib w/ chronic anticoagulation HR 70 today.  Patient had AV nodal ablation 2019 on Eliquis -Continue Eliquis, CT head without acute hemorrhage  DM2 Last A1c 9.1 on 12/27/2020.  Patient  taking Tresiba 32 units and NovoLog 12 units before evening meals.  Gabapentin 854 mg, Trulicity 1.5 mg, Jardiance 10 mg. Glucose 180 today, range 240-151. -Hold home meds, resume basal insulin when patient is eating -sSSI  HTN -Patient BP 122/77 today, goal BP 130/90.  Home medications include losartan 25 mg Coreg 25 mg hydralazine 50 mg. -Continue Coreg and hydralazine -Hold ARB in setting of impaired kidney function, restart at d/c  HLD Patient most recent lipid panel 6/23 showed a T. Chol 139, Tri 92, HDL 35, LDL 86.  -Continue Crestor 20 mg  MDD -Continue Paroxetine 30 mg  OSA on CPAP -Consider CPAP nightly while admitted  GERD -Continue Prilosec 40 mg    FEN/GI: Heart Healthy -Carb modified, Protonix PPx: Eliquis Dispo: tele cardiac  pending clinical improvement .    Subjective:  Patient resting comfortably today, no acute signs of distress. Reports no weakness of FND. Appreciates range of motion in neck is improved.   Objective: Temp:  [98 F (36.7 C)-98.7 F (37.1 C)] 98.4 F (36.9 C) (09/25 0720) Pulse Rate:  [69-72] 69 (09/25 0720) Resp:  [13-19] 13 (09/25 0720) BP: (119-144)/(72-98) 122/77 (09/25 0720) SpO2:  [96 %-100 %] 96 % (09/25 0720) Weight:  [98.7 kg] 98.7 kg (09/25 0407) Physical Exam: General: Elderly, African American male, no acute distress Cardiovascular: RRR, nrmg Respiratory: CTABL, no wheezes or stridor Abdomen: Soft, NT/ND Extremities: No swelling, moving all extremities independently  Laboratory: Recent Labs  Lab 02/16/21 1820 02/18/21 0045  WBC 7.4 7.5  HGB 14.3 13.3  HCT 43.0 39.8  PLT 133* 119*   Recent Labs  Lab 02/16/21 1820  02/17/21 0606 02/18/21 0045  NA 137 136 137  K 3.5 3.6 3.4*  CL 99 100 102  CO2 29 25 26   BUN 30* 31* 30*  CREATININE 3.51* 3.31* 3.21*  CALCIUM 9.9 9.4 9.8  PROT 7.0  --   --   BILITOT 1.0  --   --   ALKPHOS 81  --   --   ALT 21  --   --   AST 18  --   --   GLUCOSE 207* 225* 174*       Imaging/Diagnostic Tests: CT Head WO contrast: No acute intracranial hemorrhage or infarct, stable remote left frontal cortical and scattered lacunar infarcts, mild paranasal disease, stable from last CT Head.  TTE: Wnl, EF 60-65%, some mitral valve calcification  Holley Bouche, MD 02/18/2021, 8:37 AM PGY-1, [place] Intern pager: [phone], text pages welcome

## 2021-02-18 NOTE — Progress Notes (Signed)
VASCULAR LAB    Carotid duplex has been performed.  See CV proc for preliminary results.   Ebba Goll, RVT 02/18/2021, 10:45 AM

## 2021-02-18 NOTE — Progress Notes (Addendum)
Overnight attending Dr. Volanda Napoleon paged for telemetry order.

## 2021-02-18 NOTE — Care Management Obs Status (Signed)
[facility] NOTIFICATION   Patient Details  Name: Anthony [name] MRN: [id number] Date of Birth: 1951/02/05   Medicare Observation Status Notification Given:  Yes    Dawayne Patricia, RN 02/18/2021, 4:06 PM

## 2021-02-18 NOTE — Progress Notes (Signed)
FPTS Interim Night Progress Note  S:Patient sleeping comfortably.  Rounded with primary night RN. Had requested pain medication earlier.  MSIR 15 mg ordered x1.  RN reports went to assess patient and was sleeping.  Medication not given. No other concerns voiced.  No orders required.    O: Today's Vitals   02/17/21 2009 02/17/21 2108 02/17/21 2334 02/17/21 2341  BP: (!) 142/83 (!) 142/80 119/76   Pulse: 70 70 71   Resp: 15 17 18    Temp: 98.5 F (36.9 C) 98 F (36.7 C) 98.3 F (36.8 C)   TempSrc: Oral Oral Oral   SpO2: 98% 96% 100%   PainSc: Asleep   0-No pain    A/P:  Acute Ischemic Stroke NCT head shows no acute infarct or hemorrhage. Stable remote left frontal cortical and scattered lacunar infarcts.  -F/U Carotid u/s -Neuro following  Chronic back pain Home medication Opana 10-15mg  daily.  -Can have MSIR 15 mg x 1 for pain -Day team to reassess pain control to avoid withdrawal  Carollee Leitz MD PGY-3, [place] Medicine Service pager [phone]

## 2021-02-19 DIAGNOSIS — R29898 Other symptoms and signs involving the musculoskeletal system: Secondary | ICD-10-CM | POA: Diagnosis not present

## 2021-02-19 DIAGNOSIS — N189 Chronic kidney disease, unspecified: Secondary | ICD-10-CM | POA: Diagnosis not present

## 2021-02-19 DIAGNOSIS — G459 Transient cerebral ischemic attack, unspecified: Secondary | ICD-10-CM | POA: Diagnosis not present

## 2021-02-19 DIAGNOSIS — N179 Acute kidney failure, unspecified: Secondary | ICD-10-CM | POA: Diagnosis not present

## 2021-02-19 LAB — BASIC METABOLIC PANEL
Anion gap: 8 (ref 5–15)
Anion gap: 9 (ref 5–15)
BUN: 33 mg/dL — ABNORMAL HIGH (ref 8–23)
BUN: 33 mg/dL — ABNORMAL HIGH (ref 8–23)
CO2: 26 mmol/L (ref 22–32)
CO2: 25 mmol/L (ref 22–32)
Calcium: 9.4 mg/dL (ref 8.9–10.3)
Calcium: 9.2 mg/dL (ref 8.9–10.3)
Chloride: 101 mmol/L (ref 98–111)
Chloride: 102 mmol/L (ref 98–111)
Creatinine, Ser: 3.39 mg/dL — ABNORMAL HIGH (ref 0.61–1.24)
Creatinine, Ser: 3.35 mg/dL — ABNORMAL HIGH (ref 0.61–1.24)
GFR, Estimated: 19 mL/min — ABNORMAL LOW (ref >60)
GFR, Estimated: 19 mL/min — ABNORMAL LOW (ref >60)
Glucose, Bld: 201 mg/dL — ABNORMAL HIGH (ref 70–99)
Glucose, Bld: 220 mg/dL — ABNORMAL HIGH (ref 70–99)
Potassium: 3.7 mmol/L (ref 3.5–5.1)
Potassium: 3.7 mmol/L (ref 3.5–5.1)
Sodium: 135 mmol/L (ref 135–145)
Sodium: 136 mmol/L (ref 135–145)

## 2021-02-19 LAB — CBC
HCT: 41.5 % (ref 39.0–52.0)
HCT: 38.9 % — ABNORMAL LOW (ref 39.0–52.0)
Hemoglobin: 13.7 g/dL (ref 13.0–17.0)
Hemoglobin: 13.2 g/dL (ref 13.0–17.0)
MCH: 29.5 pg (ref 26.0–34.0)
MCH: 30.1 pg (ref 26.0–34.0)
MCHC: 33 g/dL (ref 30.0–36.0)
MCHC: 33.9 g/dL (ref 30.0–36.0)
MCV: 89.4 fL (ref 80.0–100.0)
MCV: 88.8 fL (ref 80.0–100.0)
Platelets: 113 10*3/uL — ABNORMAL LOW (ref 150–400)
Platelets: 105 10*3/uL — ABNORMAL LOW (ref 150–400)
RBC: 4.64 MIL/uL (ref 4.22–5.81)
RBC: 4.38 MIL/uL (ref 4.22–5.81)
RDW: 14.3 % (ref 11.5–15.5)
RDW: 14 % (ref 11.5–15.5)
WBC: 6.3 10*3/uL (ref 4.0–10.5)
WBC: 6.2 10*3/uL (ref 4.0–10.5)
nRBC: 0 % (ref 0.0–0.2)
nRBC: 0 % (ref 0.0–0.2)

## 2021-02-19 LAB — GLUCOSE, CAPILLARY
Glucose-Capillary: 200 mg/dL — ABNORMAL HIGH (ref 70–99)
Glucose-Capillary: 270 mg/dL — ABNORMAL HIGH (ref 70–99)
Glucose-Capillary: 169 mg/dL — ABNORMAL HIGH (ref 70–99)

## 2021-02-19 MED ORDER — INSULIN ASPART 100 UNIT/ML IJ SOLN: 0.0000 [IU] | Freq: Every day | INTRAMUSCULAR | Status: DC

## 2021-02-19 MED ORDER — INSULIN ASPART 100 UNIT/ML IJ SOLN
0.0000 [IU] | Freq: Three times a day (TID) | INTRAMUSCULAR | Status: DC
Start: 2021-02-19 — End: 2021-02-19

## 2021-02-19 MED ORDER — INSULIN GLARGINE-YFGN 100 UNIT/ML ~~LOC~~ SOLN
16.0000 [IU] | Freq: Every day | SUBCUTANEOUS | Status: DC
  Administered 2021-02-19: 16 [IU] via SUBCUTANEOUS
  Filled 2021-02-19: qty 0.16

## 2021-02-19 NOTE — Progress Notes (Addendum)
Family Medicine Teaching Service Daily Progress Note Intern Pager: [phone]  Patient name: Anthony [name] Medical record number: [id number] Date of birth: 1950/07/27 Age: 70 y.o. Gender: male  Primary Care Provider: Lattie Haw, MD Consultants: Nephrology Code Status: Full  Pt Overview and Major Events to Date:  Patient Admitted - 02/17/21  Assessment and Plan: GRECO GASTELUM is a 70 y.o. male presenting with left sided leg weakness, with left sideded headache and neck pain . PMH is significant for A. Fib, CHF (diastolic and systolic, EF 64-68%), s/p AICD (Medtronic device placed 2019), CAD s/p stent placement, Seizures, CVA, CKD IV, HTN, HLD, DM2, CAD, MDD, OSA, and GERD.   Left Leg Weakness  Dizziness Patient carotid ultrasound preliminary read demonstrated near normal exam, with minimal wall thickening or plaque. Patient reports no loss of strength or dizziness when standing. MRI could not be obtained today because AICD is medtronic/st.judes device, and no one on site can switch to safe mode. Consulted Neurology, they do not require MRI, and believe this event was 2/2 his sneezing/coughing event that preceded the leg weakness. Patient cleared to d/c home on Eliquis 5 mg BID.  -MRI no longer needed -Continue cardiac monitoring -PT/OT/SLP -Stroke education  A o CKD IV Creatinine 3.35 (3.21, 3.31) today, 3.5 on admission, baseline 2.8-3.2 -Encourage PO fluids -Continue calcitriol -AM BMP -Avoid nephrotoxic agents  Chronic back pain w/ chronic opioid use No complaints of pain overnight.  Patient taking oxymorphone 5 mg daily (10-15 mg TID in chart) at home. Reports that he hasn't been up a lot and so he doesn't have much back pain.   CHF diastolic and systolic EF 60 to 03% in 07/08/22.  Patient wt 98.6 (98.7 yesterday) kilograms today.  Home medications include furosemide -Hold home meds in settingof AKI -Daily weights -Strict I's and O's  A fib w/ chronic  anticoagulation Heart rate 70's today.  Patient had AV nodal ablation 2019, on Eliquis. CT head without acute hemorrhage -Continue Eliquis 5 mg BID  DM2 Last A1c 9.1 on 12/28/2018 0.  Glucose today 220, range 208-220. Patient taking Tresiba 32 units nightly and NovoLog 12 units before evening meals, Gabapentin 825 mg, Trulicity 1.5 mg, Jardiance 10 mg -Hold home meds -Start basal insulin, Glargine 16 units daily, and aspart 0-5 qhs -sSSI  HTN Patient BP stable 115/77 this am, goal is </= 130/90. Patient had one BP recorded at 146/94 overnight. Home medications include losartan 25 mg, Coreg 25 mg, hydralazine 50 mg -Continue Coreg 25 mg, hydralazine 50 mg -Hold Losartan 25 mg in setting of AKI  HLD Patient most recent lipid panel on 6/23 showed T. Chol 139, Tri 92, HDL 35, LDL 86.  - Continue Crestor 20 mg  MDD -Continue Paroxetine 30 mg  OSA on CPAP -Consider CPAP nightly while admitted  GERD -Continue Prilosec 40 mg   FEN/GI: Heart Healthy Carb modified, Protonix PPx: Eliquis Dispo: Tele cardiac     .    Subjective:  Patient with no complaints and feeling well. Expressed concern about getting MRI with AICD, reassured patient.  Objective: Temp:  [97.7 F (36.5 C)-98.6 F (37 C)] 98.2 F (36.8 C) (09/26 0611) Pulse Rate:  [69-76] 69 (09/26 0611) Resp:  [14-20] 16 (09/26 0611) BP: (108-146)/(66-94) 108/66 (09/26 0611) SpO2:  [95 %-98 %] 97 % (09/26 0611) Weight:  [98.6 kg] 98.6 kg (09/26 0037) Physical Exam: General: African American male, Resting, no acute sings of distress Cardiovascular: RRR, NRMG Respiratory: CTABL, no wheezes or stridor  Abdomen: Soft, NT/ND Extremities: No edema in extremities  Laboratory: Recent Labs  Lab 02/18/21 0045 02/19/21 0044 02/19/21 0512  WBC 7.5 6.3 6.2  HGB 13.3 13.7 13.2  HCT 39.8 41.5 38.9*  PLT 119* 113* 105*   Recent Labs  Lab 02/16/21 1820 02/17/21 0606 02/18/21 0045 02/19/21 0044 02/19/21 0512  NA 137   < >  137 135 136  K 3.5   < > 3.4* 3.7 3.7  CL 99   < > 102 101 102  CO2 29   < > 26 26 25   BUN 30*   < > 30* 33* 33*  CREATININE 3.51*   < > 3.21* 3.39* 3.35*  CALCIUM 9.9   < > 9.8 9.4 9.2  PROT 7.0  --   --   --   --   BILITOT 1.0  --   --   --   --   ALKPHOS 81  --   --   --   --   ALT 21  --   --   --   --   AST 18  --   --   --   --   GLUCOSE 207*   < > 174* 201* 220*   < > = values in this interval not displayed.      Imaging/Diagnostic Tests: Scheduled for MRI today  Holley Bouche, MD 02/19/2021, 7:30 AM PGY-1, [place] Intern pager: [phone], text pages welcome

## 2021-02-19 NOTE — Progress Notes (Signed)
FPTS Brief Progress Note  S: Sleeping.   O: BP 125/88 (BP Location: Left Arm)   Pulse 71   Temp 98.3 F (36.8 C) (Oral)   Resp 19   Wt 98.7 kg   SpO2 96%   BMI 30.35 kg/m   General: Sleeping in bed, no acute distress. Age appropriate. Respiratory: normal effort   A/P: 1. Left leg weakness - Orders reviewed. Labs for AM ordered, which was adjusted as needed.   Gerlene Fee, DO 02/19/2021, 2:28 AM PGY-3, [facility] Night Resident  Please page [phone] with questions.

## 2021-02-19 NOTE — Discharge Summary (Addendum)
[facility] Discharge Summary  Patient name: Anthony [name] Medical record number: [id number] Date of birth: 1950-08-07 Age: 70 y.o. Gender: male Date of Admission: 02/16/2021  Date of Discharge: 02/19/21 Admitting Physician: Anthony Feil, MD  Primary Care Provider: Lattie Haw, MD Consultants: Neurology  Indication for Hospitalization: Concern for stroke  Discharge Diagnoses/Problem List:  TIA CKD IV CHF (EF 60-65%) A fib (w/ chronic anticoagulation) DM2 HTN HLD MDD OSA   Disposition: Home  Discharge Condition: Improved  Discharge Exam:  Temp:  [97.7 F (36.5 C)-98.6 F (37 C)] 98.2 F (36.8 C) (09/26 0611) Pulse Rate:  [69-76] 69 (09/26 0611) Resp:  [14-20] 16 (09/26 0611) BP: (108-146)/(66-94) 108/66 (09/26 0611) SpO2:  [95 %-98 %] 97 % (09/26 0611) Weight:  [98.6 kg] 98.6 kg (09/26 3710) Physical Exam: General: African American male, Resting, no acute sings of distress Cardiovascular: RRR, NRMG Respiratory: CTABL, no wheezes or stridor Abdomen: Soft, NT/ND Extremities: No edema in extremities  Brief Hospital Course:  Anthony [name] is a 70 y.o. male presenting with left sided leg weakness, with left sideded headache and neck pain . PMH is significant for A. Fib, CHF (diastolic and systolic, EF 62-69%), s/p AICD (Anthony [name] device placed 2019), CAD s/p stent placement, Seizures, CVA, CKD IV, HTN, HLD, DM2, CAD, MDD, OSA, and GERD. His hospital course is detailed below, by problem.   Left Leg Weakness  Dizziness  TIA Patient blew his nose and started having left sided headache, neck pain, and dizziness. Patient also appreciated left leg weakness that has slowly improved overtime. Labs showed PLT 133, Glc 207, BUN 30, Cr 3.51, Trop x2 (25, 26), B12 was 4,025. CT Head showed no acute intracranial abnormality, with stable atrophy, chronic small vessel ischemia, and remote infarcts. CT Cervical spine showed multilevel degenerative disc  disease and facet hypertrophy throughout cervical spine. No high-grade canal stenosis. Patient was admitted for need of a MRI, which was delayed because no one onsite could switch his is ICD into safe mode. Patient also received Echocardiogram demonstrating EF 60-65%. Carotid U/S was also performed demonstrating near normal exam with minimal wall thickening or plaque. Patient remained stable in the hospital, awaiting MRI. On physical exam patient had symmetric strength and sensation by day 1 of admission, with no FND. Neurology was consulted and decided that given clinical picture and history, patient no longer needed MRI, and was cleared to go home on Eliquis 5 mg BID. Neurology believed the event was secondary to a change in intracranial pressure while blowing nose.     All other conditions were chronic and stable.   Issues to follow up Stop taking B12 on discharge, only restart after re-evaluating with PCP  Anthony [name] and Losartan stopped during admission, consider restarting in outpatient setting. Patient has CKD IV, please recheck BMP and consider restarting these medications.  Significant Procedures:  Carotid U/S  CT Head CT Cervical Spine  Significant Labs and Imaging:  Recent Labs  Lab 02/18/21 0045 02/19/21 0044 02/19/21 0512  WBC 7.5 6.3 6.2  HGB 13.3 13.7 13.2  HCT 39.8 41.5 38.9*  PLT 119* 113* 105*   Recent Labs  Lab 02/16/21 1820 02/17/21 0606 02/18/21 0045 02/19/21 0044 02/19/21 0512  NA 137 136 137 135 136  K 3.5 3.6 3.4* 3.7 3.7  CL 99 100 102 101 102  CO2 29 25 26 26 25   GLUCOSE 207* 225* 174* 201* 220*  BUN 30* 31* 30* 33* 33*  CREATININE 3.51* 3.31* 3.21*  3.39* 3.35*  CALCIUM 9.9 9.4 9.8 9.4 9.2  PHOS  --  4.5  --   --   --   ALKPHOS 81  --   --   --   --   AST 18  --   --   --   --   ALT 21  --   --   --   --   ALBUMIN 3.6 3.2*  --   --   --       Results/Tests Pending at Time of Discharge: None  Discharge Medications:  Allergies as of  02/19/2021       Reactions   Enalapril Maleate Cough        Medication List     STOP taking these medications    empagliflozin 10 MG Tabs tablet Commonly known as: Anthony [name]   furosemide 40 MG tablet Commonly known as: LASIX   Klor-Con M20 20 MEQ tablet Generic drug: potassium chloride SA   losartan 25 MG tablet Commonly known as: COZAAR   vitamin B-12 1000 MCG tablet Commonly known as: CYANOCOBALAMIN       TAKE these medications    apixaban 5 MG Tabs tablet Commonly known as: Eliquis Take 1 tablet (5 mg total) by mouth 2 (two) times daily.   calcitRIOL 0.5 MCG capsule Commonly known as: ROCALTROL Take 0.5 mcg by mouth daily at 12 noon.   carvedilol 25 MG tablet Commonly known as: COREG Take 1 tablet (25 mg total) by mouth 2 (two) times daily. Please make appt with Dr. Johney [name] (Cardiologist) before anymore refills. Thank you 1st attempt   FreeStyle Libre 14 Day Reader Anthony [name] Apply topically as directed.   FreeStyle Libre 14 Day Sensor Misc 1 Device by Does not apply route every 14 (fourteen) days.   gabapentin 300 MG capsule Commonly known as: NEURONTIN Take 1 capsule (300 mg total) by mouth at bedtime.   hydrALAZINE 50 MG tablet Commonly known as: APRESOLINE Take 50 mg by mouth 3 (three) times daily.   loratadine 10 MG tablet Commonly known as: CLARITIN Take 10 mg by mouth daily as needed for allergies.   multivitamin tablet Take 1 tablet by mouth daily.   nitroGLYCERIN 0.4 MG SL tablet Commonly known as: NITROSTAT Place 1 tablet (0.4 mg total) under the tongue every 5 (five) minutes as needed for chest pain. If no relief by 3rd tab, call 911   NovoLOG FlexPen 100 UNIT/ML FlexPen Generic drug: insulin aspart Inject 12 Units into the skin daily before supper. Or largest meal of the day. What changed:  when to take this reasons to take this   omeprazole 40 MG capsule Commonly known as: PRILOSEC TAKE ONE CAPSULE BY MOUTH DAILY BEFORE  SUPPER What changed: See the new instructions.   onetouch ultrasoft lancets Use as instructed   OneTouch Verio test strip Generic drug: glucose blood Use as instructed   oxymorphone 5 MG tablet Commonly known as: OPANA Take 10-15 mg by mouth daily. Per Dr Anthony [name]   PARoxetine 30 MG tablet Commonly known as: PAXIL TAKE 1 TABLET BY MOUTH EVERY DAY   Pen Needles 32G X 4 MM Misc 1 Device by Does not apply route as directed.   rosuvastatin 20 MG tablet Commonly known as: CRESTOR TAKE 1 TABLET BY MOUTH EVERY DAY   tamsulosin 0.4 MG Caps capsule Commonly known as: FLOMAX Take 0.4 mg by mouth.   Tyler Aas FlexTouch 200 UNIT/ML FlexTouch Pen Generic drug: insulin degludec Inject 32 Units into the skin  daily.   Trulicity 1.5 QK/8.6NO Sopn Generic drug: Dulaglutide Inject 1.5 mg into the skin once a week.        Discharge Instructions: Please refer to Patient Instructions section of EMR for full details.  Patient was counseled important signs and symptoms that should prompt return to medical care, changes in medications, dietary instructions, activity restrictions, and follow up appointments.   Follow-Up Appointments:  Follow-up Information     Gerrit Heck, MD. Go on 02/28/2021.   Specialty: Family Medicine Why: Please arrive 15 minutes early for 2:30 PM appt Contact information: [place] [address] [phone]         [facility]. Call.   Contact information: [address]     [place] [id number] [phone]        Care, [facility] Follow up.   Specialty: Home Health Services Why: They will call you to schedule apt times. Contact information: [place] [address] [phone]                 Holley Bouche, MD 02/19/2021, 5:37 PM PGY-1, [place] Upper-Level Resident Addendum   I have independently interviewed  and examined the patient. I have discussed the above with the original author and agree with their documentation. I have made additional edits as necessary. Please see also any attending notes.   Gladys Damme, M.D. PGY-3, [place] [facility] 02/19/2021 5:51 PM  [place] Service pager: [phone] (text pages welcome through [facility])

## 2021-02-19 NOTE — Progress Notes (Signed)
RN went over d/c summary with pt and removed pt's PIV. Belongings with pt. PT will transport himself home in private vehicle. Melburn Popper Lift transported pt to his vehicle, parked in the [facility] parking lot on [address].

## 2021-02-19 NOTE — Progress Notes (Signed)
Heart Failure Navigator Progress Note  Assessed for Heart & Vascular TOC clinic readiness.  Patient does not meet criteria due to SCr baseline >2, currently 3.35.   Navigator available for reassessment of patient.   Pricilla Holm, MSN, RN Heart Failure Nurse Navigator [phone]

## 2021-02-19 NOTE — Plan of Care (Signed)

## 2021-02-19 NOTE — Progress Notes (Signed)
FPTS Interim Progress Note  Called Anthony [name] with MRI to ensure that patient's AICD is either turned off or placed on safety mode appropriately for MRI/MRA. She noted that this patient's hardware if made by different manufacturing companies. Given the presence of mismatched manufacturers, they will not be proceeding with the MRI/MRA imaging for safety reasons. I informed Willette Cluster that this is the first time the primary team has been informed of this change.      Donney Dice, DO 02/19/2021, 12:26 PM PGY-2, [place] [facility] Service pager [phone]

## 2021-02-19 NOTE — Progress Notes (Signed)
Inpatient Diabetes Program Recommendations  AACE/ADA: New Consensus Statement on Inpatient Glycemic Control   Target Ranges:  Prepandial:   less than 140 mg/dL      Peak postprandial:   less than 180 mg/dL (1-2 hours)      Critically ill patients:  140 - 180 mg/dL   Results for MERRIT, FRIESEN (MRN [id number]) as of 02/19/2021 13:59  Ref. Range 02/18/2021 06:45 02/18/2021 11:26 02/18/2021 16:20 02/18/2021 21:51 02/19/2021 06:09 02/19/2021 11:44  Glucose-Capillary Latest Ref Range: 70 - 99 mg/dL 180 (H) 122 (H) 208 (H) 205 (H) 200 (H) 270 (H)    Review of Glycemic Control  Diabetes history: DM2 Outpatient Diabetes medications: Tresiba 32 units daily, Novolog 12 units daily as needed with largest meal, Jardiance 10 mg daily, Trulicity 1.5 mg Qweek Current orders for Inpatient glycemic control: Novolog 0-9 units TID with meals  Inpatient Diabetes Program Recommendations:    Insulin: Please consider ordering Semglee 15 units Q24H and adding Novolog 0-5 units QHS for bedtime correction.  Thanks, Barnie Alderman, RN, MSN, CDE Diabetes Coordinator Inpatient Diabetes Program [phone] (Team Pager from 8am to 5pm)

## 2021-02-19 NOTE — Discharge Instructions (Addendum)
Thank you for allowing [place] to participate in your care! You came to use for concern of having a stroke. At this time we believe the symptoms were caused by the nose blowing episode. Given your return to baseline function, there is no longer a concern for a stroke.  Please continue: -Eliquis 5 mg, twice a day   Seek immediate medical care if you have new symptoms of: -Weakness -Loss of sensation -Difficulty speaking  -Changes in balance

## 2021-02-19 NOTE — TOC Transition Note (Signed)
Transition of Care [facility]) - CM/SW Discharge Note   Patient Details  Name: Anthony [name] MRN: [id number] Date of Birth: Feb 13, 1951  [facility] of [facility]) CM/SW Contact:  Zenon Mayo, RN Phone Number: 02/19/2021, 4:46 PM   Clinical Narrative:    Patient is for dc today, PT eval rec HHPT, NCM offered choice, he states Alvis Lemmings is ok with him.  NCM made referral to [facility] with Alvis Lemmings, he is able to take referral.  Soc will begin 24 to 48 hrs post dc.  He states he has transportation home today.    Final next level of care: [place] Barriers to Discharge: No Barriers Identified   Patient Goals and CMS Choice Patient states their goals for this hospitalization and ongoing recovery are:: return home CMS Medicare.gov Compare Post Acute Care list provided to:: Patient Choice offered to / list presented to : Patient  Discharge Placement                       Discharge Plan and Services                  DME Agency: NA       HH Arranged: PT [place] Agency: [place] Date [place]: 02/19/21 Time [place]: 4492 Representative spoke with at [place]: [place] ([place]) Interventions     Readmission Risk Interventions No flowsheet data found.

## 2021-02-19 NOTE — Progress Notes (Addendum)
Inpatient Diabetes Program Recommendations  AACE/ADA: New Consensus Statement on Inpatient Glycemic Control (2015)  Target Ranges:  Prepandial:   less than 140 mg/dL      Peak postprandial:   less than 180 mg/dL (1-2 hours)      Critically ill patients:  140 - 180 mg/dL   Lab Results  Component Value Date   GLUCAP 270 (H) 02/19/2021   HGBA1C 9.1 (A) 12/27/2020    Review of Glycemic Control Results for Anthony [name], Anthony [name] (MRN [id number]) as of 02/19/2021 14:01  Ref. Range 02/18/2021 16:20 02/18/2021 21:51 02/19/2021 06:09 02/19/2021 11:44  Glucose-Capillary Latest Ref Range: 70 - 99 mg/dL 208 (H) 205 (H) 200 (H) 270 (H)   Diabetes history: Type 2 Dm Outpatient Diabetes medications: Trulicity 1.5 mg Qwk, Jardiance 10 mg QD, Tresiba 32 units QD, Novolog 12 units QD Current orders for Inpatient glycemic control: Novolog 0-9 units TID  Inpatient Diabetes Program Recommendations:    Consider Levemir 15 units QD.   Thanks, Bronson Curb, MSN, RNC-OB Diabetes Coordinator [phone] (8a-5p)

## 2021-02-20 ENCOUNTER — Encounter: Payer: Self-pay | Admitting: Family Medicine

## 2021-02-20 ENCOUNTER — Ambulatory Visit (INDEPENDENT_AMBULATORY_CARE_PROVIDER_SITE_OTHER): Payer: BC Managed Care – PPO | Admitting: Family Medicine

## 2021-02-20 ENCOUNTER — Other Ambulatory Visit: Payer: Self-pay

## 2021-02-20 ENCOUNTER — Ambulatory Visit: Payer: HMO | Admitting: Family Medicine

## 2021-02-20 VITALS — BP 129/93 | HR 90 | Wt 224.2 lb

## 2021-02-20 DIAGNOSIS — Z794 Long term (current) use of insulin: Secondary | ICD-10-CM

## 2021-02-20 DIAGNOSIS — I10 Essential (primary) hypertension: Secondary | ICD-10-CM

## 2021-02-20 DIAGNOSIS — E119 Type 2 diabetes mellitus without complications: Secondary | ICD-10-CM

## 2021-02-20 NOTE — Progress Notes (Signed)
    SUBJECTIVE:   CHIEF COMPLAINT / HPI:   Hospital follow-up Patient was recently hospitalized for a TIA.  He reports that since discharge he has been doing well and has had no symptoms.  He did restart his Jardiance and left start and when he got out of the hospital although on discharge instructions he reports he is not supposed to continue taking these.  Patient reports he has an appointment with his nephrologist within a month.  Reports that he has known kidney disease and follows with [place] [facility].  Denies any signs or symptoms of hypertension.  Has been taking Eliquis twice daily since being discharged.  OBJECTIVE:   BP (!) 129/93   Pulse 90   Wt 224 lb 3.2 oz (101.7 kg)   SpO2 100%   BMI 30.41 kg/m   General: Well-appearing 70 year old male, no acute distress Cardiac: Regular rate and rhythm, no murmurs. Respiratory: Speaking in full sentences, normal work of breathing Abdomen: Soft, nontender, positive bowel sounds MSK: No gross abnormalities Neuro: No focal neurologic deficits, cranial nerves intact  ASSESSMENT/PLAN:   HYPERTENSION, BENIGN SYSTEMIC BP mildly elevated diastolic number today.  Patient has not taken his losartan yet because he usually takes it in the afternoon.  Because of his kidney function will continue to hold for the next week.  Discussed signs and symptoms of hypertension and strict ED precautions and patient was agreeable to this.  Plan on rechecking a BMP at the next visit in 1 week and consider restarting losartan at that time.  Insulin dependent type 2 diabetes mellitus [facility]) Patient reports that since discharge from the hospital he has been taking his Jardiance.  He also takes NovoLog and [place].  Tresiba 32 units daily and NovoLog is 12 g before 2 largest meals.  Patient was not supposed to continue taking the Jardiance after discharge given his kidney function.  Instructed him to discontinue this and he can increase his [place] to 35 units daily.   Discussed signs and symptoms of hypoglycemia and strict ED precautions were given.  Patient was agreeable to this.  Patient will follow up in 1 week but I feel that he will most likely not be able to restart the Jardiance.  Consider further discussion with nephrology regarding this.     Gifford Shave, MD [place]

## 2021-02-20 NOTE — Patient Instructions (Signed)
It was great seeing you today.  Regarding your medications I want you to not take your Jardiance or losartan until you follow-up with your kidney doctor.  Please call them and make sure you have an appointment with them.  I want you to be seen in our clinic in 1 week for repeat of your kidney functions as well as to check your blood pressures.  For your diabetes medicine I want you to increase your Tresiba to 35 units daily.  Please keep track of your blood sugars and if you have any signs or symptoms of low blood sugars let us know.  If you have a headache that does not resolve with Tylenol please seek medical attention.  He should also check your blood pressure if this occurs.  If you have any questions or concerns call the clinic.  I hope you have a wonderful afternoon!

## 2021-02-20 NOTE — Telephone Encounter (Signed)
Appointment with Dr Caryl Comes scheduled for 03/26/2021.

## 2021-02-21 NOTE — Assessment & Plan Note (Signed)
Patient reports that since discharge from the hospital he has been taking his Jardiance.  He also takes NovoLog and [place].  Tresiba 32 units daily and NovoLog is 12 g before 2 largest meals.  Patient was not supposed to continue taking the Jardiance after discharge given his kidney function.  Instructed him to discontinue this and he can increase his [place] to 35 units daily.  Discussed signs and symptoms of hypoglycemia and strict ED precautions were given.  Patient was agreeable to this.  Patient will follow up in 1 week but I feel that he will most likely not be able to restart the Jardiance.  Consider further discussion with nephrology regarding this.

## 2021-02-21 NOTE — Assessment & Plan Note (Signed)
BP mildly elevated diastolic number today.  Patient has not taken his losartan yet because he usually takes it in the afternoon.  Because of his kidney function will continue to hold for the next week.  Discussed signs and symptoms of hypertension and strict ED precautions and patient was agreeable to this.  Plan on rechecking a BMP at the next visit in 1 week and consider restarting losartan at that time.

## 2021-02-22 DIAGNOSIS — E119 Type 2 diabetes mellitus without complications: Secondary | ICD-10-CM | POA: Diagnosis not present

## 2021-02-26 ENCOUNTER — Encounter: Payer: Self-pay | Admitting: Podiatry

## 2021-02-26 ENCOUNTER — Ambulatory Visit (INDEPENDENT_AMBULATORY_CARE_PROVIDER_SITE_OTHER): Payer: Medicare Other | Admitting: Podiatry

## 2021-02-26 ENCOUNTER — Other Ambulatory Visit: Payer: Self-pay

## 2021-02-26 DIAGNOSIS — B351 Tinea unguium: Secondary | ICD-10-CM

## 2021-02-26 DIAGNOSIS — E119 Type 2 diabetes mellitus without complications: Secondary | ICD-10-CM

## 2021-02-26 DIAGNOSIS — M79609 Pain in unspecified limb: Secondary | ICD-10-CM

## 2021-02-26 DIAGNOSIS — D689 Coagulation defect, unspecified: Secondary | ICD-10-CM | POA: Diagnosis not present

## 2021-02-26 DIAGNOSIS — N189 Chronic kidney disease, unspecified: Secondary | ICD-10-CM

## 2021-02-26 DIAGNOSIS — M2010 Hallux valgus (acquired), unspecified foot: Secondary | ICD-10-CM

## 2021-02-26 DIAGNOSIS — N179 Acute kidney failure, unspecified: Secondary | ICD-10-CM

## 2021-02-26 NOTE — Progress Notes (Signed)
    SUBJECTIVE:   CHIEF COMPLAINT / HPI:   Renal function: Patient reports that he has made an appointment with nephrology for follow up. Will obtain BMP today.   HTN: BP is below goal today: 98/58. Losartan held due to renal function at hospital d/c. Recheck BMP today. Patient denies any dizziness, no falls, he is taking hydralazine 3 times daily.  DM2: jardiance held given renal function at d/c from hospital. He is taking tresiba 35U daily, novolog 12U TID qAC sliding scale.  He reports that he had a fasting blood sugar of 62 this morning without symptoms.  He drank coffee and it came back up.  His postprandial BG has been ranging from 200-355.  His blood sugar the day before was 155.  Last A1c in August was 9.1%.  Left hip: Patient notes that he still having some trouble going up stairs.  Feels like his hip is not as strong as it once was.  He is not having any pain but has just noticed this subtle change over time.  PERTINENT  PMH / PSH:   OBJECTIVE:   BP (!) 98/58   Pulse 85   Wt 220 lb (99.8 kg)   SpO2 97%   BMI 29.84 kg/m   Nursing note and vitals reviewed GEN: Age-appropriate, AAM, resting comfortably in chair, NAD, WNWD Cardiac: Regular rate and rhythm. Normal S1/S2. No murmurs, rubs, or gallops appreciated. 2+ radial pulses. Lungs: Clear bilaterally to ascultation. No increased WOB, no accessory muscle usage. No w/r/r. Hip, right: No obvious rash, erythema, ecchymosis, or edema. ROM full in all directions; Strength 5/5 in IR/ER/Flex/Ext/, 4/5 in add/abd. Pelvic alignment unremarkable to inspection and palpation. Standing hip rotation and gait without trendelenburg / unsteadiness. Greater trochanter without tenderness to palpation. No tenderness over piriformis. No SI joint tenderness and normal minimal SI movement.  Neuro: AOx3  Ext: no edema Psych: Pleasant and appropriate  ASSESSMENT/PLAN:   HYPERTENSION, BENIGN SYSTEMIC Patient with hypotension today, asymptomatic.  Do  not recommend restarting losartan given very low blood pressure.  Recommend discontinue hydralazine altogether.  Will check BMP for kidney function.  We will have patient follow-up in pharmacy clinic next Tuesday, October 11 to recheck blood pressure.  Based on kidney function, and if blood pressure normalizes can consider restarting losartan.  Insulin dependent type 2 diabetes mellitus ([place]) Patient with less than ideal control with A1c in August 2022 at 9.1%.  Appears he may be rather brittle diabetic with a range of 62 fasting glucose to 300 postprandial.  Recommend he follow-up next week with pharmacy clinic.  Recommend backing down from 35 to 32 units of insulin given low fasting glucose of 62 this morning.  Continue sliding scale given high postprandial values.  Acute kidney injury superimposed on chronic kidney disease ([place]) Recheck BMP to assess renal function  Hip weakness Patient reports this is not the same as he had in the hospital.  He does not have true weakness in the sensei is 5 out of 5 strength on flexion and extension of the hip however he has weak abductors and gluteus medius left greater than right.  To help prevent falls as patient has noticed this is particularly difficult when going upstairs, recommend he go to physical therapy to improve strength  Healthcare maintenance Flu shot given today.     Gladys Damme, MD [place]

## 2021-02-26 NOTE — Progress Notes (Signed)
This patient returns to my office for at risk foot care.  This patient requires this care by a professional since this patient will be at risk due to having venous insufficiency, chronic kidney disease stage IV diabetes and coagulation defect.  Patient is taking eliquiss.  This patient is unable to cut nails himself since the patient cannot reach his nails.These nails are painful walking and wearing shoes.  This patient presents for at risk foot care today.  General Appearance  Alert, conversant and in no acute stress.  Vascular  Dorsalis pedis and posterior tibial  pulses are palpable  bilaterally.  Capillary return is within normal limits  bilaterally. Temperature is within normal limits  bilaterally.  Neurologic  Senn-Weinstein monofilament wire test within normal limits  bilaterally. Muscle power within normal limits bilaterally.  Nails Thick disfigured discolored nails with subungual debris  from hallux to fifth toes bilaterally. No evidence of bacterial infection or drainage bilaterally.  Orthopedic  No limitations of motion  feet .  No crepitus or effusions noted.  No bony pathology or digital deformities noted.  Amputation second toe left foot.  Skin  normotropic skin with no porokeratosis noted bilaterally.  No signs of infections or ulcers noted.  Porokeratosis sub 4 left and sub 5th right asymptomatic.  Onychomycosis  Pain in right toes  Pain in left toes  Consent was obtained for treatment procedures.   Mechanical debridement of nails 1-5  Right and 1,3-5 left foot. performed with a nail nipper.  Filed with dremel without incident.    Return office visit   9  weeks                   Told patient to return for periodic foot care and evaluation due to potential at risk complications.   Markella Dao DPM 

## 2021-02-27 ENCOUNTER — Ambulatory Visit (INDEPENDENT_AMBULATORY_CARE_PROVIDER_SITE_OTHER): Payer: HMO | Admitting: Family Medicine

## 2021-02-27 VITALS — BP 98/58 | HR 85 | Wt 220.0 lb

## 2021-02-27 DIAGNOSIS — N179 Acute kidney failure, unspecified: Secondary | ICD-10-CM | POA: Diagnosis not present

## 2021-02-27 DIAGNOSIS — E119 Type 2 diabetes mellitus without complications: Secondary | ICD-10-CM | POA: Diagnosis not present

## 2021-02-27 DIAGNOSIS — I10 Essential (primary) hypertension: Secondary | ICD-10-CM | POA: Diagnosis not present

## 2021-02-27 DIAGNOSIS — R29898 Other symptoms and signs involving the musculoskeletal system: Secondary | ICD-10-CM | POA: Insufficient documentation

## 2021-02-27 DIAGNOSIS — Z23 Encounter for immunization: Secondary | ICD-10-CM | POA: Diagnosis not present

## 2021-02-27 DIAGNOSIS — Z794 Long term (current) use of insulin: Secondary | ICD-10-CM | POA: Diagnosis not present

## 2021-02-27 DIAGNOSIS — Z0000 Encounter for general adult medical examination without abnormal findings: Secondary | ICD-10-CM

## 2021-02-27 DIAGNOSIS — N189 Chronic kidney disease, unspecified: Secondary | ICD-10-CM | POA: Diagnosis not present

## 2021-02-27 DIAGNOSIS — N184 Chronic kidney disease, stage 4 (severe): Secondary | ICD-10-CM | POA: Diagnosis not present

## 2021-02-27 NOTE — Assessment & Plan Note (Signed)
Patient reports this is not the same as he had in the hospital.  He does not have true weakness in the sensei is 5 out of 5 strength on flexion and extension of the hip however he has weak abductors and gluteus medius left greater than right.  To help prevent falls as patient has noticed this is particularly difficult when going upstairs, recommend he go to physical therapy to improve strength

## 2021-02-27 NOTE — Assessment & Plan Note (Signed)
Flu shot given today

## 2021-02-27 NOTE — Assessment & Plan Note (Signed)
Patient with hypotension today, asymptomatic.  Do not recommend restarting losartan given very low blood pressure.  Recommend discontinue hydralazine altogether.  Will check BMP for kidney function.  We will have patient follow-up in pharmacy clinic next Tuesday, October 11 to recheck blood pressure.  Based on kidney function, and if blood pressure normalizes can consider restarting losartan.

## 2021-02-27 NOTE — Assessment & Plan Note (Signed)
Recheck BMP to assess renal function

## 2021-02-27 NOTE — Assessment & Plan Note (Signed)
Patient with less than ideal control with A1c in August 2022 at 9.1%.  Appears he may be rather brittle diabetic with a range of 62 fasting glucose to 300 postprandial.  Recommend he follow-up next week with pharmacy clinic.  Recommend backing down from 35 to 32 units of insulin given low fasting glucose of 62 this morning.  Continue sliding scale given high postprandial values.

## 2021-02-27 NOTE — Patient Instructions (Signed)
It was a pleasure to see you today!  I recommend stop taking hydralazine as your blood pressure is low If you have any dizzyness, call our office and let us know [phone] We will get some labs today.  If they are abnormal or we need to do something about them, I will call you.  If they are normal, I will send you a message on MyChart (if it is active) or a letter in the mail.  If you don't hear from [place] in 2 weeks, please call the office  [phone]. Please follow up with Dr. Valentina Lucks on 10/11 at 9:45 AM and bring all of your medications to that visit    Be Well,  Dr. Chauncey Reading

## 2021-02-28 ENCOUNTER — Telehealth: Payer: Self-pay | Admitting: Family Medicine

## 2021-02-28 ENCOUNTER — Inpatient Hospital Stay: Payer: Medicare Other | Admitting: Student

## 2021-02-28 LAB — BASIC METABOLIC PANEL
BUN/Creatinine Ratio: 13 (ref 10–24)
BUN: 37 mg/dL — ABNORMAL HIGH (ref 8–27)
CO2: 22 mmol/L (ref 20–29)
Calcium: 9.7 mg/dL (ref 8.6–10.2)
Chloride: 99 mmol/L (ref 96–106)
Creatinine, Ser: 2.89 mg/dL — ABNORMAL HIGH (ref 0.76–1.27)
Glucose: 379 mg/dL — ABNORMAL HIGH (ref 70–99)
Potassium: 4.2 mmol/L (ref 3.5–5.2)
Sodium: 139 mmol/L (ref 134–144)
eGFR: 23 mL/min/{1.73_m2} — ABNORMAL LOW (ref >59)

## 2021-02-28 NOTE — Telephone Encounter (Signed)
Attempted to call patient to discuss BMP results, he did not answer, HIPAA compliant VM. His acute kidney injury has resolved, his kidney function is back to his baseline. However, due to low blood pressure yesterday, I do not recommend restarting his losartan yet. I do recommend stopping hydralazine (as discussed at appt yesterday). He should follow up on 10/11 with pharmacy clinic to recheck BP. If he has any dizziness, lightheadedness, have him call the office and check his blood pressure at home. If he returns call it is ok to tell him the above.  Gladys Damme, MD [facility], PGY-3

## 2021-03-06 ENCOUNTER — Encounter: Payer: Self-pay | Admitting: Pharmacist

## 2021-03-06 ENCOUNTER — Ambulatory Visit (INDEPENDENT_AMBULATORY_CARE_PROVIDER_SITE_OTHER): Payer: HMO | Admitting: Pharmacist

## 2021-03-06 ENCOUNTER — Other Ambulatory Visit: Payer: Self-pay

## 2021-03-06 DIAGNOSIS — Z794 Long term (current) use of insulin: Secondary | ICD-10-CM

## 2021-03-06 DIAGNOSIS — E119 Type 2 diabetes mellitus without complications: Secondary | ICD-10-CM

## 2021-03-06 DIAGNOSIS — R0609 Other forms of dyspnea: Secondary | ICD-10-CM | POA: Diagnosis not present

## 2021-03-06 DIAGNOSIS — Z95810 Presence of automatic (implantable) cardiac defibrillator: Secondary | ICD-10-CM | POA: Diagnosis not present

## 2021-03-06 DIAGNOSIS — R931 Abnormal findings on diagnostic imaging of heart and coronary circulation: Secondary | ICD-10-CM | POA: Diagnosis not present

## 2021-03-06 DIAGNOSIS — I5022 Chronic systolic (congestive) heart failure: Secondary | ICD-10-CM | POA: Diagnosis not present

## 2021-03-06 MED ORDER — CARVEDILOL 25 MG PO TABS: 25.0000 mg | ORAL_TABLET | Freq: Two times a day (BID) | ORAL | 1 refills | Status: DC

## 2021-03-06 MED ORDER — LOSARTAN POTASSIUM 25 MG PO TABS: 25.0000 mg | ORAL_TABLET | Freq: Every day | ORAL | 3 refills | Status: DC

## 2021-03-06 MED ORDER — NOVOLOG FLEXPEN 100 UNIT/ML ~~LOC~~ SOPN: 10.0000 [IU] | PEN_INJECTOR | Freq: Two times a day (BID) | SUBCUTANEOUS | 11 refills | Status: DC

## 2021-03-06 MED ORDER — TRESIBA FLEXTOUCH 200 UNIT/ML ~~LOC~~ SOPN: 26.0000 [IU] | PEN_INJECTOR | Freq: Every day | SUBCUTANEOUS | 0 refills | Status: DC

## 2021-03-06 NOTE — Assessment & Plan Note (Signed)
Longstanding heart failure with recent medication reduction due to hypotension.  -Refilled carvedilol 25mg  BID. -Restarted losartan 25mg  once daily.  Patient has adequate previous dose supply.

## 2021-03-06 NOTE — Progress Notes (Signed)
S:     Chief Complaint  Patient presents with   Medication Management    Diabetes - Medication Review    Patient arrives in good spirits, walking without assistance but with noticeable limp favoring his left hip/leg.Joen Laura for diabetes evaluation, education, and management and medication review.  Patient was referred on 02/27/2021 by Dr. Chauncey Reading.  Patient was last seen by Primary Care Provider, Dr. Posey Pronto on 12/27/2020.   Patient seen in pharmacy clinic many times in the past and comes for medication and diabetes review.   Medication adherence reported good.  Brought all medications except injectables and opana to visit.    Current diabetes medications include: Trulicity (dulaglutide 1.5mg  weekly on Thursday), Tresiba (insulin degludec) 36 units daily and Novolog (insulin aspart) 12-15 units prior to largest single meal of the day.  Current hypertension medications include: carvedilol - holding losartan. Current hyperlipidemia medications include: rosuvastatin 20mg   Patient reports multiple early AM hypoglycemic events since recent dose increase of Tresiba from 32 to 36 units.   Patient reported dietary habits: Eats 2 larger meals/day Breakfast:small / minimal Eats larger lunch and dinner.   O:  Physical Exam Constitutional:      Appearance: Normal appearance.  Cardiovascular:     Rate and Rhythm: Normal rate.  Pulmonary:     Effort: Pulmonary effort is normal.  Musculoskeletal:     Right lower leg: No edema.     Left lower leg: No edema.  Neurological:     Mental Status: He is alert.  Psychiatric:        Mood and Affect: Mood normal.        Behavior: Behavior normal.        Thought Content: Thought content normal.    Review of Systems  Constitutional:  Positive for malaise/fatigue.  Respiratory:  Negative for shortness of breath.    Lab Results  Component Value Date   HGBA1C 9.1 (A) 12/27/2020   Vitals:   03/06/21 1046  BP: (!) 158/105  Pulse: 69  SpO2:  99%    Lipid Panel     Component Value Date/Time   CHOL 139 11/16/2020 0954   TRIG 92 11/16/2020 0954   HDL 35 (L) 11/16/2020 0954   CHOLHDL 4.0 11/16/2020 0954   CHOLHDL 3.2 02/28/2016 0906   VLDL 19 02/28/2016 0906   LDLCALC 86 11/16/2020 0954   LDLDIRECT 58 11/06/2011 1434   Libre GMI = 8.2 With lows multiple times 5:00-6:00 AM over the last 7 days.    Clinical Atherosclerotic Cardiovascular Disease (ASCVD): Yes  The 10-year ASCVD risk score (Arnett DK, et al., 2019) is: 44%   Values used to calculate the score:     Age: 70 years     Sex: Male     Is Non-Hispanic African American: Yes     Diabetic: Yes     Tobacco smoker: No     Systolic Blood Pressure: 967 mmHg     Is BP treated: Yes     HDL Cholesterol: 35 mg/dL     Total Cholesterol: 139 mg/dL    A/P: Diabetes longstanding with recently increased frequency of hypoglycemia.  Patient is able to verbalize appropriate hypoglycemia management plan. Medication adherence appears good. Control is suboptimal due to  suboptimal insulin dosing regimen. -Decreased dose of basal insulin Tresiba (insulin degludec)from 36 to 26 units once daily.  -Increased dose of  rapid insulin Novolog (insulin aspart) to 10,12, of 15 units prior to TWO meals per day.  -  Continued GLP-1 Trulicity (dulaglutide)) at 1.5mg  once weekly on Thursday.   - Continued to hold   SGLT2-I Jardiance (empagliflozin)  -Counseled on s/sx of and management of hypoglycemia   Longstanding heart failure with recent medication reduction due to hypotension.  -Refilled carvedilol 25mg  BID. -Restarted losartan 25mg  once daily.  Patient has adequate previous dose supply.  Repeat BMET - next visit.   Written patient instructions provided.  Total time in face to face counseling 45 minutes.   Follow up Pharmacist Clinic Visit in 4-5 weeks if unable to see PCP, Dr. Posey Pronto.   Patient seen with Elyse Jarvis, PharmD Candidate.

## 2021-03-06 NOTE — Progress Notes (Signed)
Reviewed: I agree with Dr. Koval's documentation and management. 

## 2021-03-06 NOTE — Patient Instructions (Signed)
Nice to see you today.   Anthony [name] - reduce to 26 units once daily.   Novolog - start twice daily prior to meals take 10, 12 or 15 units as discussed.   Next follow-up with Dr. Posey Pronto in 4-5 weeks.  If you are unable to get an appointment by mid-November please schedule back with pharmacy clinic [facility]).

## 2021-03-06 NOTE — Assessment & Plan Note (Signed)
Diabetes longstanding with recently increased frequency of hypoglycemia.  Patient is able to verbalize appropriate hypoglycemia management plan. Medication adherence appears good. Control is suboptimal due to  suboptimal insulin dosing regimen. -Decreased dose of basal insulin Tresiba (insulin degludec)from 36 to 26 units once daily.  -Increased dose of  rapid insulin Novolog (insulin aspart) to 10,12, of 15 units prior to TWO meals per day.  -Continued GLP-1 Trulicity (dulaglutide)) at 1.5mg  once weekly on Thursday.   -Continued to hold  SGLT2-I Jardiance (empagliflozin)  -Counseled on s/sx of and management of hypoglycemia

## 2021-03-12 ENCOUNTER — Ambulatory Visit: Payer: HMO | Attending: Family Medicine

## 2021-03-12 ENCOUNTER — Other Ambulatory Visit: Payer: Self-pay

## 2021-03-12 DIAGNOSIS — M6281 Muscle weakness (generalized): Secondary | ICD-10-CM | POA: Diagnosis not present

## 2021-03-12 DIAGNOSIS — Z7409 Other reduced mobility: Secondary | ICD-10-CM | POA: Diagnosis not present

## 2021-03-12 NOTE — Therapy (Signed)
[place], [place], [address] Phone: [phone]   Fax:  [phone]  Physical Therapy Evaluation  Patient Details  Name: Anthony [name] MRN: [id number] Date of Birth: Aug 25, 1950 Referring Provider (PT): Zenia Resides, MD   Encounter Date: 03/12/2021   PT End of Session - 03/12/21 1709     Visit Number 1    Number of Visits 17    Date for PT Re-Evaluation 05/07/21    Authorization Type [employer]    Progress Note Due on Visit 10    PT Start Time 1615    PT Stop Time 1700    PT Time Calculation (min) 45 min    Activity Tolerance Patient tolerated treatment well    Behavior During Therapy [facility] for tasks assessed/performed             Past Medical History:  Diagnosis Date   Arthritis    Benign neoplasm of descending colon    Benign neoplasm of sigmoid colon    Benign neoplasm of transverse colon    CAD in native artery    a. reported MI 2000, 2001, s/p stenting of the circumflex lesion extending into the second obtuse marginal branch with a drug-eluting stent in 2003. b. low risk nuc 2015.   Chronic atrial fibrillation (HCC)    Chronic combined systolic and diastolic CHF (congestive heart failure) ([place])    a. Previously diastolic, then EF 78-24% in 10/2016.   CKD (chronic kidney disease), stage III (HCC)    Depression    Diabetes mellitus    Edema of extremities    Erectile dysfunction    GERD (gastroesophageal reflux disease)    Hyperlipidemia    Hypertension    Myocardial infarction ([place]) 2000&2001   Obesity    Onychomycosis    OSA (obstructive sleep apnea)    S/P ICD (internal cardiac defibrillator) procedure and BiV device,  07/25/17 Medtronic  07/26/2017   Seizures ([place])    Sleep apnea    Stroke ([place])    Tubular adenoma of colon 10/2002    Past Surgical History:  Procedure Laterality Date   ANGIOPLASTY  2001   stent x 1   AV NODE ABLATION N/A 08/20/2017   Procedure:  AV NODE ABLATION;  Surgeon: Deboraha Sprang, MD;  Location: [place] [facility];  Service: Cardiovascular;  Laterality: N/A;   BIV ICD INSERTION CRT-D N/A 07/25/2017   Procedure: BIV ICD INSERTION CRT-D;  Surgeon: Deboraha Sprang, MD;  Location: [place] [facility];  Service: Cardiovascular;  Laterality: N/A;   COLONOSCOPY  2000?   negative   COLONOSCOPY WITH PROPOFOL N/A 02/08/2019   Procedure: COLONOSCOPY WITH PROPOFOL;  Surgeon: Ladene Artist, MD;  Location: [facility];  Service: Endoscopy;  Laterality: N/A;   FOOT ARTHROTOMY Right    POLYPECTOMY  02/08/2019   Procedure: POLYPECTOMY;  Surgeon: Ladene Artist, MD;  Location: [facility];  Service: Endoscopy;;   TOE AMPUTATION Left 2012    There were no vitals filed for this visit.    Subjective Assessment - 03/12/21 1618     Subjective Pt is a pleasant 70 y/o M wh opresents to PT with reports of recent L sided numbness and weakness. He was tested d/t stroke-like symptoms, with negative work-up noted during hospital stay. He denies pain but feels very weak and unsteady on L LE. Feels like his L knee is going to buckle when going up/down stairs and occasionally when walking. Also  feels as though his L foot slaps on the ground with stairs and he has very little control. Per hospital neurology and pt report, increased intracranial pressure from blowing nose may have caused initial L LE weakness.    Pertinent History roughly 3 week history of L LE weakness; negative workup for CVA    Limitations Walking;Other (comment);House hold activities   Stairs   Patient Stated Goals Pt wants to improve L LE strength and balance for improved safety during mobility    Currently in Pain? No/denies    Pain Score 0-No pain                [facility] PT Assessment - 03/12/21 0001       Assessment   Medical Diagnosis R29.898 (ICD-10-CM) - Weakness of left hip    Referring Provider (PT) [name], [name], MD    Hand Dominance Right    Prior Therapy none       Precautions   Precautions None      Restrictions   Weight Bearing Restrictions No      Balance Screen   Has the patient fallen in the past 6 months No    Has the patient had a decrease in activity level because of a fear of falling?  Yes    Is the patient reluctant to leave their home because of a fear of falling?  No      Home Environment   Living Environment Private residence    Living Arrangements Spouse/significant other    Type of [place] to enter    Entrance Stairs-Number of Steps 9    [place] One level      Prior Function   Level of Independence Independent;Independent with basic ADLs    Vocation Retired      Charity fundraiser Status Within Functional Limits for tasks assessed    Attention Focused      Observation/Other Assessments   Focus on Therapeutic Outcomes (FOTO)  33% function; 60% predicted      Functional Tests   Functional tests Sit to Stand      Sit to Stand   Comments 30 Sec STS: 7 reps      ROM / Strength   AROM / PROM / Strength Strength      Strength   Right Hip Flexion 5/5    Right Hip ABduction 4/5    Left Hip Flexion 3+/5    Left Hip ABduction 3+/5    Right Knee Flexion 5/5    Right Knee Extension 5/5    Left Knee Flexion 5/5    Left Knee Extension 5/5      Ambulation/Gait   Gait Comments amb up/down 10 stairs with L foot slap noted on R stance      Standardized Balance Assessment   Standardized Balance Assessment Timed Up and Go Test      Timed Up and Go Test   Normal TUG (seconds) 17                        Objective measurements completed on examination: See above findings.                PT Education - 03/12/21 1709     Education Details eval findings, FOTO, HEP, POC    Person(s) Educated Patient    Methods Explanation;Demonstration;Handout    Comprehension Verbalized understanding;Returned  demonstration               PT Short Term Goals - 03/12/21 1633       PT SHORT TERM GOAL #1   Title Pt will be compliant and knowledgeable with initial HEP for improved comfort and carryover    Baseline initial HEP given    Time 3    Period Weeks    Status New    Target Date 04/02/21               PT Long Term Goals - 03/12/21 1634       PT LONG TERM GOAL #1   Title Pt will improve FOTO score to no less than **% as proxy for functional improvement    Baseline **% function    Time 8    Period Weeks    Status New    Target Date 05/07/21      PT LONG TERM GOAL #2   Title Pt will increase reps in 30 Sec STS to no less than 10 reps for improved balance and mobility    Baseline 7 reps    Time 8    Period Weeks    Status New    Target Date 05/07/21      PT LONG TERM GOAL #3   Title Pt will decrease TUG to no greater than 12 seconds for improved balance and mobility    Baseline 17 sec, increased instability when turning L    Time 8    Period Weeks    Status New    Target Date 05/07/21      PT LONG TERM GOAL #4   Title Pt will increase L hip flex/abd to no less than 5/5 for improved functional mobility    Baseline see flowsheet    Time 8    Period Weeks    Status New    Target Date 05/07/21                    Plan - 03/12/21 1710     Clinical Impression Statement Pt is a pleasant 70 y/o M who presents to PT with onset of L LE weakness, negative workup for CVA. Physical findings are consistent with MD impression and observation from hospital course, as he demonstrates decreased L hip strength on MMT, and reduced mobility/increased fall risk during TUG and 30 Sec STS. He would benefit from skilled PT services working on improving LE strength, gait, and balance.    Personal Factors and Comorbidities Comorbidity 3+;Fitness    Comorbidities PMH: AFib, CKD, DM II, HTN, MI, CVA, ICD implantation    Examination-Activity Limitations Stand;Stairs;Squat;Sit     Examination-Participation Restrictions Yard Work;Community Activity    Stability/Clinical Decision Making Evolving/Moderate complexity    Clinical Decision Making Moderate    Rehab Potential Excellent    PT Frequency 2x / week    PT Duration 8 weeks    PT Treatment/Interventions ADLs/Self Care Home Management;Cryotherapy;Moist Heat;Gait training;Stair training;Functional mobility training;Therapeutic activities;Therapeutic exercise;Balance training;Neuromuscular re-education;Patient/family education;Manual techniques;Taping    PT Next Visit Plan assess response to HEP; progress LE strength, gait, and balance as able    PT Home Exercise Plan Access Code: [email or web address]    Consulted and Agree with Plan of Care Patient             Patient will benefit from skilled therapeutic intervention in order to improve the following deficits and impairments:  Abnormal gait, Decreased activity tolerance, Decreased balance, Decreased endurance, Decreased  mobility, Decreased range of motion, Decreased strength, Impaired sensation, Pain  Visit Diagnosis: Muscle weakness (generalized) - Plan: PT plan of care cert/re-cert  Impaired functional mobility, balance, gait, and endurance - Plan: PT plan of care cert/re-cert     Problem List Patient Active Problem List   Diagnosis Date Noted   Hip weakness 02/27/2021   Healthcare maintenance 02/27/2021   Left leg weakness 02/17/2021   COVID-19 vaccine administered 12/27/2020   Neck mass 11/16/2020   Erectile dysfunction 05/31/2020   Polypharmacy 05/22/2020   Porokeratosis 04/26/2020   Sprain of foot 01/25/2020   Neck muscle strain 11/22/2019   Patient cannot afford medications 10/18/2019   Hx of adenomatous colonic polyps    S/P ICD (internal cardiac defibrillator) procedure and BiV device,  07/25/17 Medtronic  07/26/2017   NICM (nonischemic cardiomyopathy) ([place]) 07/25/2017   Congestive heart failure, NYHA class 3, chronic, systolic ([place]) 51/88/4166    Atrial fibrillation, permanent ([place]) 07/25/2017   Memory change    Thrombocytopenia ([place])    Right temporal lobe infarction ([place]) 06/13/2017   Acute kidney injury superimposed on chronic kidney disease ([place]) 06/13/2017   Seizures ([place]) 06/12/2017   Sebaceous cyst of skin of breast, left 04/25/2017   Chronic kidney disease (CKD), stage IV (severe) ([place]) 01/22/2017   Secondary hyperparathyroidism of renal origin ([place]) 01/22/2017   Chronic anticoagulation 03/06/2016   Chronic, continuous use of opioids 10/18/2014   OSA on CPAP 04/04/2014   History of tobacco abuse 06/28/2013   DIABETIC  RETINOPATHY 02/24/2008   Insulin dependent type 2 diabetes mellitus ([place]) 07/24/2006   HYPERCHOLESTEROLEMIA 07/24/2006   Depression 07/24/2006   HYPERTENSION, BENIGN SYSTEMIC 07/24/2006   Coronary atherosclerosis 07/24/2006   GASTROESOPHAGEAL REFLUX, NO ESOPHAGITIS 07/24/2006    Ward Chatters, PT 03/12/2021, 5:26 PM  [facility] [address] [place], [place], [address] Phone: [phone]   Fax:  [phone]  Name: DALTON MOLESWORTH MRN: [id number] Date of Birth: 05/07/1951

## 2021-03-13 DIAGNOSIS — D631 Anemia in chronic kidney disease: Secondary | ICD-10-CM | POA: Diagnosis not present

## 2021-03-13 DIAGNOSIS — E1122 Type 2 diabetes mellitus with diabetic chronic kidney disease: Secondary | ICD-10-CM | POA: Diagnosis not present

## 2021-03-13 DIAGNOSIS — N2581 Secondary hyperparathyroidism of renal origin: Secondary | ICD-10-CM | POA: Diagnosis not present

## 2021-03-13 DIAGNOSIS — N184 Chronic kidney disease, stage 4 (severe): Secondary | ICD-10-CM | POA: Diagnosis not present

## 2021-03-13 DIAGNOSIS — I129 Hypertensive chronic kidney disease with stage 1 through stage 4 chronic kidney disease, or unspecified chronic kidney disease: Secondary | ICD-10-CM | POA: Diagnosis not present

## 2021-03-15 ENCOUNTER — Telehealth: Payer: Self-pay

## 2021-03-15 NOTE — Telephone Encounter (Signed)
Left VM regarding re-enrollment with [facility] for 2023.  Gave call back number [phone]

## 2021-03-20 ENCOUNTER — Ambulatory Visit: Payer: HMO

## 2021-03-20 ENCOUNTER — Other Ambulatory Visit: Payer: Self-pay

## 2021-03-20 DIAGNOSIS — Z7409 Other reduced mobility: Secondary | ICD-10-CM

## 2021-03-20 DIAGNOSIS — M6281 Muscle weakness (generalized): Secondary | ICD-10-CM | POA: Diagnosis not present

## 2021-03-20 NOTE — Therapy (Signed)
[place], [place], [address] Phone: [phone]   Fax:  [phone]  Physical Therapy Treatment  Patient Details  Name: Anthony [name] MRN: [id number] Date of Birth: 01/03/1951 Referring Provider (PT): Zenia Resides, MD   Encounter Date: 03/20/2021   PT End of Session - 03/20/21 1608     Visit Number 2    Number of Visits 17    Date for PT Re-Evaluation 05/07/21    Authorization Type [employer]    Progress Note Due on Visit 10    PT Start Time 1530    PT Stop Time 1608    PT Time Calculation (min) 38 min    Activity Tolerance Patient tolerated treatment well    Behavior During Therapy [facility] for tasks assessed/performed             Past Medical History:  Diagnosis Date   Arthritis    Benign neoplasm of descending colon    Benign neoplasm of sigmoid colon    Benign neoplasm of transverse colon    CAD in native artery    a. reported MI 2000, 2001, s/p stenting of the circumflex lesion extending into the second obtuse marginal branch with a drug-eluting stent in 2003. b. low risk nuc 2015.   Chronic atrial fibrillation (HCC)    Chronic combined systolic and diastolic CHF (congestive heart failure) ([place])    a. Previously diastolic, then EF 32-44% in 10/2016.   CKD (chronic kidney disease), stage III (HCC)    Depression    Diabetes mellitus    Edema of extremities    Erectile dysfunction    GERD (gastroesophageal reflux disease)    Hyperlipidemia    Hypertension    Myocardial infarction ([place]) 2000&2001   Obesity    Onychomycosis    OSA (obstructive sleep apnea)    S/P ICD (internal cardiac defibrillator) procedure and BiV device,  07/25/17 Medtronic  07/26/2017   Seizures ([place])    Sleep apnea    Stroke ([place])    Tubular adenoma of colon 10/2002    Past Surgical History:  Procedure Laterality Date   ANGIOPLASTY  2001   stent x 1   AV NODE ABLATION N/A 08/20/2017   Procedure: AV  NODE ABLATION;  Surgeon: Deboraha Sprang, MD;  Location: [place] [facility];  Service: Cardiovascular;  Laterality: N/A;   BIV ICD INSERTION CRT-D N/A 07/25/2017   Procedure: BIV ICD INSERTION CRT-D;  Surgeon: Deboraha Sprang, MD;  Location: [place] [facility];  Service: Cardiovascular;  Laterality: N/A;   COLONOSCOPY  2000?   negative   COLONOSCOPY WITH PROPOFOL N/A 02/08/2019   Procedure: COLONOSCOPY WITH PROPOFOL;  Surgeon: Ladene Artist, MD;  [facility];  Service: Endoscopy;  Laterality: N/A;   FOOT ARTHROTOMY Right    POLYPECTOMY  02/08/2019   Procedure: POLYPECTOMY;  Surgeon: Ladene Artist, MD;  [facility];  Service: Endoscopy;;   TOE AMPUTATION Left 2012    There were no vitals filed for this visit.   Subjective Assessment - 03/20/21 1522     Subjective Pt presents to PT with reports of some lower back discomfort, which he attributes to clamshell exercise. He has been compliant with his HEP with no adverse effect. Pt is ready to begin PT at this time.    Currently in Pain? Yes    Pain Score 10-Worst pain ever    Pain Location Back    Pain Orientation Lower;Left  [place] Adult PT Treatment/Exercise:   Therapeutic Exercise:  NuStep lvl 6 x 4 min UE/LE while taking subjective Seated clamshell 2x15 blue tband LAQ 2x10 3lb Fwd ball rollouts 2x10  Bridge 2x10 Supine SLR 2x10 ea Supine ball squeeze 2x10 - 5 sec hold                               PT Short Term Goals - 03/12/21 1633       PT SHORT TERM GOAL #1   Title Pt will be compliant and knowledgeable with initial HEP for improved comfort and carryover    Baseline initial HEP given    Time 3    Period Weeks    Status New    Target Date 04/02/21               PT Long Term Goals - 03/12/21 1634       PT LONG TERM GOAL #1   Title Pt will improve FOTO score to no less than **% as proxy for functional improvement    Baseline **% function    Time 8     Period Weeks    Status New    Target Date 05/07/21      PT LONG TERM GOAL #2   Title Pt will increase reps in 30 Sec STS to no less than 10 reps for improved balance and mobility    Baseline 7 reps    Time 8    Period Weeks    Status New    Target Date 05/07/21      PT LONG TERM GOAL #3   Title Pt will decrease TUG to no greater than 12 seconds for improved balance and mobility    Baseline 17 sec, increased instability when turning L    Time 8    Period Weeks    Status New    Target Date 05/07/21      PT LONG TERM GOAL #4   Title Pt will increase L hip flex/abd to no less than 5/5 for improved functional mobility    Baseline see flowsheet    Time 8    Period Weeks    Status New    Target Date 05/07/21                   Plan - 03/20/21 1558     Clinical Impression Statement Pt was able to complete prescribed exercises with no adverse effect. Today's session focused on increasing LE strength in order to improve balance and mobility. He continues to benefit from skilled PT services as prescribed. Will continue to progress as able.    PT Treatment/Interventions ADLs/Self Care Home Management;Cryotherapy;Moist Heat;Gait training;Stair training;Functional mobility training;Therapeutic activities;Therapeutic exercise;Balance training;Neuromuscular re-education;Patient/family education;Manual techniques;Taping    PT Next Visit Plan assess response to HEP; progress LE strength, gait, and balance as able    PT Home Exercise Plan Access Code: [email or web address]             Patient will benefit from skilled therapeutic intervention in order to improve the following deficits and impairments:  Abnormal gait, Decreased activity tolerance, Decreased balance, Decreased endurance, Decreased mobility, Decreased range of motion, Decreased strength, Impaired sensation, Pain  Visit Diagnosis: Impaired functional mobility, balance, gait, and endurance  Muscle weakness  (generalized)     Problem List Patient Active Problem List   Diagnosis Date Noted   Hip weakness 02/27/2021   Healthcare maintenance 02/27/2021  Left leg weakness 02/17/2021   COVID-19 vaccine administered 12/27/2020   Neck mass 11/16/2020   Erectile dysfunction 05/31/2020   Polypharmacy 05/22/2020   Porokeratosis 04/26/2020   Sprain of foot 01/25/2020   Neck muscle strain 11/22/2019   Patient cannot afford medications 10/18/2019   Hx of adenomatous colonic polyps    S/P ICD (internal cardiac defibrillator) procedure and BiV device,  07/25/17 Medtronic  07/26/2017   NICM (nonischemic cardiomyopathy) ([place]) 07/25/2017   Congestive heart failure, NYHA class 3, chronic, systolic ([place]) 01/06/8870   Atrial fibrillation, permanent ([place]) 07/25/2017   Memory change    Thrombocytopenia ([place])    Right temporal lobe infarction ([place]) 06/13/2017   Acute kidney injury superimposed on chronic kidney disease ([place]) 06/13/2017   Seizures ([place]) 06/12/2017   Sebaceous cyst of skin of breast, left 04/25/2017   Chronic kidney disease (CKD), stage IV (severe) ([place]) 01/22/2017   Secondary hyperparathyroidism of renal origin ([place]) 01/22/2017   Chronic anticoagulation 03/06/2016   Chronic, continuous use of opioids 10/18/2014   OSA on CPAP 04/04/2014   History of tobacco abuse 06/28/2013   DIABETIC  RETINOPATHY 02/24/2008   Insulin dependent type 2 diabetes mellitus ([place]) 07/24/2006   HYPERCHOLESTEROLEMIA 07/24/2006   Depression 07/24/2006   HYPERTENSION, BENIGN SYSTEMIC 07/24/2006   Coronary atherosclerosis 07/24/2006   GASTROESOPHAGEAL REFLUX, NO ESOPHAGITIS 07/24/2006    Ward Chatters, PT 03/20/2021, 4:09 PM  [facility] [address] [place], [place], [address] Phone: [phone]   Fax:  [phone]  Name: Anthony [name] MRN: [id number] Date of Birth: 05-13-1951

## 2021-03-22 ENCOUNTER — Other Ambulatory Visit: Payer: Self-pay

## 2021-03-22 ENCOUNTER — Ambulatory Visit: Payer: HMO

## 2021-03-22 DIAGNOSIS — Z7409 Other reduced mobility: Secondary | ICD-10-CM

## 2021-03-22 DIAGNOSIS — M6281 Muscle weakness (generalized): Secondary | ICD-10-CM | POA: Diagnosis not present

## 2021-03-22 NOTE — Therapy (Signed)
[place], [place], [address] Phone: [phone]   Fax:  [phone]  Physical Therapy Treatment  Patient Details  Name: Anthony [name] MRN: [id number] Date of Birth: February 08, 1951 Referring Provider (PT): Zenia Resides, MD   Encounter Date: 03/22/2021   PT End of Session - 03/22/21 1609     Visit Number 3    Number of Visits 17    Date for PT Re-Evaluation 05/07/21    Authorization Type [employer]    Progress Note Due on Visit 10    PT Start Time 1615    PT Stop Time 1655    PT Time Calculation (min) 40 min    Activity Tolerance Patient tolerated treatment well    Behavior During Therapy [facility] for tasks assessed/performed             Past Medical History:  Diagnosis Date   Arthritis    Benign neoplasm of descending colon    Benign neoplasm of sigmoid colon    Benign neoplasm of transverse colon    CAD in native artery    a. reported MI 2000, 2001, s/p stenting of the circumflex lesion extending into the second obtuse marginal branch with a drug-eluting stent in 2003. b. low risk nuc 2015.   Chronic atrial fibrillation (HCC)    Chronic combined systolic and diastolic CHF (congestive heart failure) ([place])    a. Previously diastolic, then EF 92-11% in 10/2016.   CKD (chronic kidney disease), stage III (HCC)    Depression    Diabetes mellitus    Edema of extremities    Erectile dysfunction    GERD (gastroesophageal reflux disease)    Hyperlipidemia    Hypertension    Myocardial infarction ([place]) 2000&2001   Obesity    Onychomycosis    OSA (obstructive sleep apnea)    S/P ICD (internal cardiac defibrillator) procedure and BiV device,  07/25/17 Medtronic  07/26/2017   Seizures ([place])    Sleep apnea    Stroke ([place])    Tubular adenoma of colon 10/2002    Past Surgical History:  Procedure Laterality Date   ANGIOPLASTY  2001   stent x 1   AV NODE ABLATION N/A 08/20/2017   Procedure: AV  NODE ABLATION;  Surgeon: Deboraha Sprang, MD;  Location: [place] [facility];  Service: Cardiovascular;  Laterality: N/A;   BIV ICD INSERTION CRT-D N/A 07/25/2017   Procedure: BIV ICD INSERTION CRT-D;  Surgeon: Deboraha Sprang, MD;  Location: [place] [facility];  Service: Cardiovascular;  Laterality: N/A;   COLONOSCOPY  2000?   negative   COLONOSCOPY WITH PROPOFOL N/A 02/08/2019   Procedure: COLONOSCOPY WITH PROPOFOL;  Surgeon: Ladene Artist, MD;  [facility];  Service: Endoscopy;  Laterality: N/A;   FOOT ARTHROTOMY Right    POLYPECTOMY  02/08/2019   Procedure: POLYPECTOMY;  Surgeon: Ladene Artist, MD;  [facility];  Service: Endoscopy;;   TOE AMPUTATION Left 2012    There were no vitals filed for this visit.   Subjective Assessment - 03/22/21 1609     Subjective Pt presents to PT with reports of continued stiffness in lower back and weakness in L LE. He has been compliant with HEP, no adverse effect. Pt is ready to begin PT treatment.    Currently in Pain? Yes    Pain Score 7     Pain Location Back    Pain Orientation Left;Lower  [place] Adult PT Treatment/Exercise:   Therapeutic Exercise:  NuStep lvl 6 x 4 min UE/LE while taking subjective STS no UE support 2x10 - from raised table Seated clamshell 2x15 black tband LAQ 3x10 5lb Seated hamstring curl 2x10 blue tband Fwd ball rollouts 2x10  Bridge 2x10 Supine SLR 2x10 ea 2lbs Supine ball squeeze 2x10 - 5 sec hold Standing hip abd/ext 2x10 at counter                               PT Short Term Goals - 03/12/21 1633       PT SHORT TERM GOAL #1   Title Pt will be compliant and knowledgeable with initial HEP for improved comfort and carryover    Baseline initial HEP given    Time 3    Period Weeks    Status New    Target Date 04/02/21               PT Long Term Goals - 03/12/21 1634       PT LONG TERM GOAL #1   Title Pt will improve FOTO score to no  less than **% as proxy for functional improvement    Baseline **% function    Time 8    Period Weeks    Status New    Target Date 05/07/21      PT LONG TERM GOAL #2   Title Pt will increase reps in 30 Sec STS to no less than 10 reps for improved balance and mobility    Baseline 7 reps    Time 8    Period Weeks    Status New    Target Date 05/07/21      PT LONG TERM GOAL #3   Title Pt will decrease TUG to no greater than 12 seconds for improved balance and mobility    Baseline 17 sec, increased instability when turning L    Time 8    Period Weeks    Status New    Target Date 05/07/21      PT LONG TERM GOAL #4   Title Pt will increase L hip flex/abd to no less than 5/5 for improved functional mobility    Baseline see flowsheet    Time 8    Period Weeks    Status New    Target Date 05/07/21                   Plan - 03/22/21 1628     Clinical Impression Statement Pt was able to complete all prescribed exercises with no adverse effect or change in baseline. Today's session continued to focus on improving LE strength and balance. Pt is progressing as expected thus far with therapy and continues to benefit from skilled services. Will continue to progress exercises as tolerated per POC.    PT Treatment/Interventions ADLs/Self Care Home Management;Cryotherapy;Moist Heat;Gait training;Stair training;Functional mobility training;Therapeutic activities;Therapeutic exercise;Balance training;Neuromuscular re-education;Patient/family education;Manual techniques;Taping    PT Next Visit Plan standing balance exercises; progress LE strength, gait, and balance as able    PT Home Exercise Plan Access Code: [email or web address]             Patient will benefit from skilled therapeutic intervention in order to improve the following deficits and impairments:  Abnormal gait, Decreased activity tolerance, Decreased balance, Decreased endurance, Decreased mobility, Decreased range of motion,  Decreased strength, Impaired sensation, Pain  Visit Diagnosis: Impaired functional mobility, balance, gait,  and endurance  Muscle weakness (generalized)     Problem List Patient Active Problem List   Diagnosis Date Noted   Hip weakness 02/27/2021   Healthcare maintenance 02/27/2021   Left leg weakness 02/17/2021   COVID-19 vaccine administered 12/27/2020   Neck mass 11/16/2020   Erectile dysfunction 05/31/2020   Polypharmacy 05/22/2020   Porokeratosis 04/26/2020   Sprain of foot 01/25/2020   Neck muscle strain 11/22/2019   Patient cannot afford medications 10/18/2019   Hx of adenomatous colonic polyps    S/P ICD (internal cardiac defibrillator) procedure and BiV device,  07/25/17 Medtronic  07/26/2017   NICM (nonischemic cardiomyopathy) ([place]) 07/25/2017   Congestive heart failure, NYHA class 3, chronic, systolic ([place]) 16/96/7893   Atrial fibrillation, permanent ([place]) 07/25/2017   Memory change    Thrombocytopenia ([place])    Right temporal lobe infarction ([place]) 06/13/2017   Acute kidney injury superimposed on chronic kidney disease ([place]) 06/13/2017   Seizures ([place]) 06/12/2017   Sebaceous cyst of skin of breast, left 04/25/2017   Chronic kidney disease (CKD), stage IV (severe) ([place]) 01/22/2017   Secondary hyperparathyroidism of renal origin ([place]) 01/22/2017   Chronic anticoagulation 03/06/2016   Chronic, continuous use of opioids 10/18/2014   OSA on CPAP 04/04/2014   History of tobacco abuse 06/28/2013   DIABETIC  RETINOPATHY 02/24/2008   Insulin dependent type 2 diabetes mellitus ([place]) 07/24/2006   HYPERCHOLESTEROLEMIA 07/24/2006   Depression 07/24/2006   HYPERTENSION, BENIGN SYSTEMIC 07/24/2006   Coronary atherosclerosis 07/24/2006   GASTROESOPHAGEAL REFLUX, NO ESOPHAGITIS 07/24/2006    Ward Chatters, PT 03/22/2021, 4:57 PM  [facility] [address] [place], [place], [address] Phone: [phone]   Fax:   [phone]  Name: Anthony [name] MRN: [id number] Date of Birth: 08/04/1950

## 2021-03-23 ENCOUNTER — Telehealth: Payer: Self-pay

## 2021-03-23 NOTE — Telephone Encounter (Signed)
Pt p/u medications from [facility]

## 2021-03-23 NOTE — Telephone Encounter (Signed)
Gave pt reminder call that he has medication ready for pickup. Michela Pitcher it could come by today before 12pm.  Tyler Aas & pen needles in med room, labeled & ready for p/u.

## 2021-03-26 ENCOUNTER — Encounter: Payer: Self-pay | Admitting: Internal Medicine

## 2021-03-26 ENCOUNTER — Ambulatory Visit (INDEPENDENT_AMBULATORY_CARE_PROVIDER_SITE_OTHER): Payer: HMO | Admitting: Internal Medicine

## 2021-03-26 ENCOUNTER — Other Ambulatory Visit: Payer: Self-pay

## 2021-03-26 VITALS — BP 142/90 | HR 79 | Ht 72.0 in | Wt 233.6 lb

## 2021-03-26 DIAGNOSIS — Z95810 Presence of automatic (implantable) cardiac defibrillator: Secondary | ICD-10-CM

## 2021-03-26 DIAGNOSIS — I4821 Permanent atrial fibrillation: Secondary | ICD-10-CM | POA: Diagnosis not present

## 2021-03-26 DIAGNOSIS — I5022 Chronic systolic (congestive) heart failure: Secondary | ICD-10-CM | POA: Diagnosis not present

## 2021-03-26 DIAGNOSIS — I428 Other cardiomyopathies: Secondary | ICD-10-CM | POA: Diagnosis not present

## 2021-03-26 NOTE — Progress Notes (Signed)
Patient Care Team: Lattie Haw, MD as PCP - General (Family Medicine) Dorothy Spark, MD (Inactive) as PCP - Cardiology (Cardiology) Marlaine Hind, MD as Consulting Physician (Physical Medicine and Rehabilitation) Jamal Maes, MD as Consulting Physician (Nephrology)   HPI  Anthony [name] is a 70 y.o. male Seen for atrial fibrillation with a poorly controlled ventricular response now s/p AV ablation.3/19  History of ischemic heart disease with stenting 2003.  He was noted to have low voltage and left ventricular hypertrophy and the issue was raised as to whether he might have transthyretin amyloid technetium; pyrophosphate imaging was notable for only grade 1 findings.  He has not undergone electrophoresis testing.  Hospitalized 9/22 with leg weakness which normalized.  Neurology was consulted" believe the event was secondary to a change in intracranial pressure while blowing nose "  Today, he is doing "halfway okay". Since his previous hospital visit, he is able to walk without a cane. However, moving quickly causes him to be short of breath. He sleeps on his side and uses a CPAP machine. His pacemaker battery has about 2 years left.  The patient denies chest pain, nocturnal dyspnea, orthopnea or peripheral edema.  There have been no palpitations, lightheadedness or syncope.  Complains of dyspnea on exertion.   DATE TEST EF     7/15  Myoview    No ischemia  9/15  Echo    55-60 %    6/18 Echo  25-30% Severe LAE   8/19 Echo  55-65   --/19 PYP  negative  10/21 Echo  60-65%   10/21 Myoview  45-55%   9/22 Echo 60 - 65 % Mild aortic calcification       Date Cr K Mg Hgb  10/17 1.55 3.4   13.7  7/18 1.82   1.5  13.8  9/18 2.2 4.5  13.5  3/19 2.09 3.3    4/19 2.5 5.3    7.20 2.94 4.9  13.1  4/21 2.87 4.4    10/22 2.89 4.2  13.2 (9/22)      Past Medical History:  Diagnosis Date   Arthritis    Benign neoplasm of descending colon    Benign neoplasm of  sigmoid colon    Benign neoplasm of transverse colon    CAD in native artery    a. reported MI 2000, 2001, s/p stenting of the circumflex lesion extending into the second obtuse marginal branch with a drug-eluting stent in 2003. b. low risk nuc 2015.   Chronic atrial fibrillation (HCC)    Chronic combined systolic and diastolic CHF (congestive heart failure) ([place])    a. Previously diastolic, then EF 62-70% in 10/2016.   CKD (chronic kidney disease), stage III (HCC)    Depression    Diabetes mellitus    Edema of extremities    Erectile dysfunction    GERD (gastroesophageal reflux disease)    Hyperlipidemia    Hypertension    Myocardial infarction ([place]) 2000&2001   Obesity    Onychomycosis    OSA (obstructive sleep apnea)    S/P ICD (internal cardiac defibrillator) procedure and BiV device,  07/25/17 Medtronic  07/26/2017   Seizures ([place])    Sleep apnea    Stroke ([place])    Tubular adenoma of colon 10/2002    Past Surgical History:  Procedure Laterality Date   ANGIOPLASTY  2001   stent x 1   AV NODE ABLATION N/A 08/20/2017   Procedure: AV NODE ABLATION;  Surgeon: Deboraha Sprang, MD;  Location: [place] [facility];  Service: Cardiovascular;  Laterality: N/A;   BIV ICD INSERTION CRT-D N/A 07/25/2017   Procedure: BIV ICD INSERTION CRT-D;  Surgeon: Deboraha Sprang, MD;  Location: [place] [facility];  Service: Cardiovascular;  Laterality: N/A;   COLONOSCOPY  2000?   negative   COLONOSCOPY WITH PROPOFOL N/A 02/08/2019   Procedure: COLONOSCOPY WITH PROPOFOL;  Surgeon: Ladene Artist, MD;  [facility];  Service: Endoscopy;  Laterality: N/A;   FOOT ARTHROTOMY Right    POLYPECTOMY  02/08/2019   Procedure: POLYPECTOMY;  Surgeon: Ladene Artist, MD;  [facility];  Service: Endoscopy;;   TOE AMPUTATION Left 2012    Current Outpatient Medications  Medication Sig Dispense Refill   apixaban (ELIQUIS) 5 MG TABS tablet Take 1 tablet (5 mg total) by mouth 2 (two) times daily.  180 tablet 1   calcitRIOL (ROCALTROL) 0.5 MCG capsule Take 0.5 mcg by mouth daily at 12 noon.      carvedilol (COREG) 25 MG tablet Take 1 tablet (25 mg total) by mouth 2 (two) times daily. Please make appt with Dr. Johney Frame (Cardiologist) before anymore refills. Thank you 1st attempt 180 tablet 1   Continuous Blood Gluc Receiver (FREESTYLE LIBRE 14 DAY READER) DEVI Apply topically as directed.     Continuous Blood Gluc Sensor (FREESTYLE LIBRE 14 DAY SENSOR) MISC 1 Device by Does not apply route every 14 (fourteen) days. 2 each 11   Dulaglutide (TRULICITY) 1.5 DJ/2.4QA SOPN Inject 1.5 mg into the skin once a week. 6 mL 3   gabapentin (NEURONTIN) 300 MG capsule Take 1 capsule (300 mg total) by mouth at bedtime. 90 capsule 3   glucose blood (ONETOUCH VERIO) test strip Use as instructed 100 each 12   hydrALAZINE (APRESOLINE) 50 MG tablet Take 50 mg by mouth 3 (three) times daily.     insulin aspart (NOVOLOG FLEXPEN) 100 UNIT/ML FlexPen Inject 10-15 Units into the skin 2 (two) times daily before lunch and supper. Or largest meal of the day. 15 mL 11   insulin degludec (TRESIBA FLEXTOUCH) 200 UNIT/ML FlexTouch Pen Inject 26 Units into the skin daily. 12 mL 0   Insulin Pen Needle (PEN NEEDLES) 32G X 4 MM MISC 1 Device by Does not apply route as directed. 200 each 11   Lancets (ONETOUCH ULTRASOFT) lancets Use as instructed 100 each 12   loratadine (CLARITIN) 10 MG tablet Take 10 mg by mouth daily as needed for allergies.     losartan (COZAAR) 25 MG tablet Take 1 tablet (25 mg total) by mouth at bedtime. 90 tablet 3   Multiple Vitamin (MULTIVITAMIN) tablet Take 1 tablet by mouth daily.       nitroGLYCERIN (NITROSTAT) 0.4 MG SL tablet Place 1 tablet (0.4 mg total) under the tongue every 5 (five) minutes as needed for chest pain. If no relief by 3rd tab, call 911 25 tablet 3   omeprazole (PRILOSEC) 40 MG capsule TAKE ONE CAPSULE BY MOUTH DAILY BEFORE SUPPER (Patient taking differently: Take 40 mg by mouth  daily.) 90 capsule 3   oxymorphone (OPANA) 5 MG tablet Take 10-15 mg by mouth daily. Per Dr Brien Few     PARoxetine (PAXIL) 30 MG tablet TAKE 1 TABLET BY MOUTH EVERY DAY (Patient taking differently: Take 30 mg by mouth daily.) 90 tablet 1   rosuvastatin (CRESTOR) 20 MG tablet TAKE 1 TABLET BY MOUTH EVERY DAY (Patient taking differently: Take 20 mg by mouth  daily.) 90 tablet 3   tamsulosin (FLOMAX) 0.4 MG CAPS capsule Take 0.4 mg by mouth.     No current facility-administered medications for this visit.    Allergies  Allergen Reactions   Enalapril Maleate Cough      Review of Systems negative except from HPI and PMH  Physical Exam BP (!) 142/90   Pulse 79   Ht 6' (1.829 m)   Wt 233 lb 9.6 oz (106 kg)   SpO2 100%   BMI 31.68 kg/m  .Well developed and well nourished in no acute distress HENT normal Neck supple with JVP-flat Clear Device pocket well healed; without hematoma or erythema.  There is no tethering  Regular rate and rhythm, no  gallop No  murmur Abd-soft with active BS No Clubbing cyanosis 1+ edema Skin-warm and dry A & Oriented  Grossly normal sensory and motor function  ECG  afib  With V pacing ay 79 -/15/45 Negative QRS lead 1 and RS lead V1     Assessment and  Plan  Atrial fibrillation-permanent rapid ventricular response  AV Node ablation   LV threshold elevated   Low-voltage limb leads and severe concentric LVH concerning for amyloid   Congestive heart failure-chronic-systolic-class IIb    Hypertension  Renal insufficiency grade 4  Presyncope  Nonischemic cardiomyopathy-new  Implantable defibrillator  Volume status stable, esp given renal function holding diuretic  HTN reasonably controlled again limited by BP  continue apresoline 50 tid, losartan 25 and coreg 25 bid  Atrial fib permanent. S/p av ablation  Continue Apixoban  5 bid         I,Mykaella Javier,acting as a scribe for Virl Axe, MD.,have documented all relevant  documentation on the behalf of Virl Axe, MD,as directed by  Virl Axe, MD while in the presence of Virl Axe, MD.   I, Virl Axe, MD, have reviewed all documentation for this visit. The documentation on 03/26/21 for the exam, diagnosis, procedures, and orders are all accurate and complete.

## 2021-03-26 NOTE — Patient Instructions (Signed)
Medication Instructions:  Your physician recommends that you continue on your current medications as directed. Please refer to the Current Medication list given to you today.  *If you need a refill on your cardiac medications before your next appointment, please call your pharmacy*   Lab Work: None ordered.  If you have labs (blood work) drawn today and your tests are completely normal, you will receive your results only by: [place] (if you have MyChart) OR A paper copy in the mail If you have any lab test that is abnormal or we need to change your treatment, we will call you to review the results.   Testing/Procedures: None ordered.    Follow-Up: At [facility], you and your health needs are our priority.  As part of our continuing mission to provide you with exceptional heart care, we have created designated Provider Care Teams.  These Care Teams include your primary Cardiologist (physician) and Advanced Practice Providers (APPs -  Physician Assistants and Nurse Practitioners) who all work together to provide you with the care you need, when you need it.  We recommend signing up for the patient portal called "MyChart".  Sign up information is provided on this After Visit Summary.  MyChart is used to connect with patients for Virtual Visits (Telemedicine).  Patients are able to view lab/test results, encounter notes, upcoming appointments, etc.  Non-urgent messages can be sent to your provider as well.   To learn more about what you can do with MyChart, go to [email or web address].    Your next appointment:   Dr Johney Frame - 6 months  Dr Caryl Comes 12 months

## 2021-03-27 ENCOUNTER — Ambulatory Visit: Payer: HMO | Attending: Family Medicine

## 2021-03-27 DIAGNOSIS — Z7409 Other reduced mobility: Secondary | ICD-10-CM | POA: Diagnosis not present

## 2021-03-27 DIAGNOSIS — M6281 Muscle weakness (generalized): Secondary | ICD-10-CM

## 2021-03-27 NOTE — Therapy (Signed)
[place], [place], [address] Phone: [phone]   Fax:  [phone]  Physical Therapy Treatment  Patient Details  Name: COLUM COLT MRN: [id number] Date of Birth: 04/29/1951 Referring Provider (PT): Zenia Resides, MD   Encounter Date: 03/27/2021   PT End of Session - 03/27/21 1459     Visit Number 4   arrived late   Number of Visits 17    Date for PT Re-Evaluation 05/07/21    Authorization Type [employer]    Progress Note Due on Visit 10    PT Start Time 1459    PT Stop Time 1526    PT Time Calculation (min) 27 min    Activity Tolerance Patient tolerated treatment well    Behavior During Therapy [facility] for tasks assessed/performed             Past Medical History:  Diagnosis Date   Arthritis    Benign neoplasm of descending colon    Benign neoplasm of sigmoid colon    Benign neoplasm of transverse colon    CAD in native artery    a. reported MI 2000, 2001, s/p stenting of the circumflex lesion extending into the second obtuse marginal branch with a drug-eluting stent in 2003. b. low risk nuc 2015.   Chronic atrial fibrillation (HCC)    Chronic combined systolic and diastolic CHF (congestive heart failure) ([place])    a. Previously diastolic, then EF 69-62% in 10/2016.   CKD (chronic kidney disease), stage III (HCC)    Depression    Diabetes mellitus    Edema of extremities    Erectile dysfunction    GERD (gastroesophageal reflux disease)    Hyperlipidemia    Hypertension    Myocardial infarction ([place]) 2000&2001   Obesity    Onychomycosis    OSA (obstructive sleep apnea)    S/P ICD (internal cardiac defibrillator) procedure and BiV device,  07/25/17 Medtronic  07/26/2017   Seizures ([place])    Sleep apnea    Stroke ([place])    Tubular adenoma of colon 10/2002    Past Surgical History:  Procedure Laterality Date   ANGIOPLASTY  2001   stent x 1   AV NODE ABLATION N/A 08/20/2017    Procedure: AV NODE ABLATION;  Surgeon: Deboraha Sprang, MD;  Location: [facility] [facility];  Service: Cardiovascular;  Laterality: N/A;   BIV ICD INSERTION CRT-D N/A 07/25/2017   Procedure: BIV ICD INSERTION CRT-D;  Surgeon: Deboraha Sprang, MD;  Location: [place];  Service: Cardiovascular;  Laterality: N/A;   COLONOSCOPY  2000?   negative   COLONOSCOPY WITH PROPOFOL N/A 02/08/2019   Procedure: COLONOSCOPY WITH PROPOFOL;  Surgeon: Ladene Artist, MD;  [facility]: [facility];  Service: Endoscopy;  Laterality: N/A;   FOOT ARTHROTOMY Right    POLYPECTOMY  02/08/2019   Procedure: POLYPECTOMY;  Surgeon: Ladene Artist, MD;  Location: [facility];  Service: Endoscopy;;   TOE AMPUTATION Left 2012    There were no vitals filed for this visit.   Subjective Assessment - 03/27/21 1459     Subjective Pt presents to PT with reports of slightly upset stomach. He has been compliant with HEP with no adverse effect. Pt is ready to begin PT treatment.    Currently in Pain? No/denies    Pain Score 0-No pain           [facility] Adult PT Treatment/Exercise:   Therapeutic Exercise:  NuStep  lvl 7 x 4 min UE/LE while taking subjective STS no UE support 2x10 - from raised table Seated clamshell 3x15 black tband LAQ 3x10 5lb Seated hamstring curl 2x10 black tband Fwd ball rollouts 2x10 - 3 sec hold  Past Interventions Not Performed Today: Bridge 2x10 Supine SLR 2x10 ea 2lbs Supine ball squeeze 2x10 - 5 sec hold Standing hip abd/ext 2x10 at counter                               PT Short Term Goals - 03/12/21 1633       PT SHORT TERM GOAL #1   Title Pt will be compliant and knowledgeable with initial HEP for improved comfort and carryover    Baseline initial HEP given    Time 3    Period Weeks    Status New    Target Date 04/02/21               PT Long Term Goals - 03/12/21 1634       PT LONG TERM GOAL #1   Title Pt will improve FOTO score to no  less than **% as proxy for functional improvement    Baseline **% function    Time 8    Period Weeks    Status New    Target Date 05/07/21      PT LONG TERM GOAL #2   Title Pt will increase reps in 30 Sec STS to no less than 10 reps for improved balance and mobility    Baseline 7 reps    Time 8    Period Weeks    Status New    Target Date 05/07/21      PT LONG TERM GOAL #3   Title Pt will decrease TUG to no greater than 12 seconds for improved balance and mobility    Baseline 17 sec, increased instability when turning L    Time 8    Period Weeks    Status New    Target Date 05/07/21      PT LONG TERM GOAL #4   Title Pt will increase L hip flex/abd to no less than 5/5 for improved functional mobility    Baseline see flowsheet    Time 8    Period Weeks    Status New    Target Date 05/07/21                   Plan - 03/27/21 1508     Clinical Impression Statement Pt was again able to complete prescribed exercises with no adverse effect. Today's session we continued to work on improving LE strength and general activity tolerance. Pt is progressing as expected with therapy thus far and continues to benefit from skilled services. PT will continue to progress exercises and balance as toelrated per POC.    PT Treatment/Interventions ADLs/Self Care Home Management;Cryotherapy;Moist Heat;Gait training;Stair training;Functional mobility training;Therapeutic activities;Therapeutic exercise;Balance training;Neuromuscular re-education;Patient/family education;Manual techniques;Taping    PT Next Visit Plan standing balance exercises; progress LE strength, gait, and balance as able    PT Home Exercise Plan Access Code: [email or web address]             Patient will benefit from skilled therapeutic intervention in order to improve the following deficits and impairments:  Abnormal gait, Decreased activity tolerance, Decreased balance, Decreased endurance, Decreased mobility, Decreased range of  motion, Decreased strength, Impaired sensation, Pain  Visit Diagnosis: Impaired functional mobility, balance,  gait, and endurance  Muscle weakness (generalized)     Problem List Patient Active Problem List   Diagnosis Date Noted   Hip weakness 02/27/2021   Healthcare maintenance 02/27/2021   Left leg weakness 02/17/2021   COVID-19 vaccine administered 12/27/2020   Neck mass 11/16/2020   Erectile dysfunction 05/31/2020   Polypharmacy 05/22/2020   Porokeratosis 04/26/2020   Sprain of foot 01/25/2020   Neck muscle strain 11/22/2019   Patient cannot afford medications 10/18/2019   Hx of adenomatous colonic polyps    S/P ICD (internal cardiac defibrillator) procedure and BiV device,  07/25/17 Medtronic  07/26/2017   NICM (nonischemic cardiomyopathy) ([place]) 07/25/2017   Congestive heart failure, NYHA class 3, chronic, systolic ([place]) 02/16/3006   Atrial fibrillation, permanent ([place]) 07/25/2017   Memory change    Thrombocytopenia ([place])    Right temporal lobe infarction ([place]) 06/13/2017   Acute kidney injury superimposed on chronic kidney disease ([place]) 06/13/2017   Seizures ([place]) 06/12/2017   Sebaceous cyst of skin of breast, left 04/25/2017   Chronic kidney disease (CKD), stage IV (severe) ([place]) 01/22/2017   Secondary hyperparathyroidism of renal origin ([place]) 01/22/2017   Chronic anticoagulation 03/06/2016   Chronic, continuous use of opioids 10/18/2014   OSA on CPAP 04/04/2014   History of tobacco abuse 06/28/2013   DIABETIC  RETINOPATHY 02/24/2008   Insulin dependent type 2 diabetes mellitus ([place]) 07/24/2006   HYPERCHOLESTEROLEMIA 07/24/2006   Depression 07/24/2006   HYPERTENSION, BENIGN SYSTEMIC 07/24/2006   Coronary atherosclerosis 07/24/2006   GASTROESOPHAGEAL REFLUX, NO ESOPHAGITIS 07/24/2006    Ward Chatters, PT 03/27/2021, 3:27 PM  [facility] [facility] [address] [place], [place], [address] Phone: [phone]   Fax:   [phone]  Name: SOCRATES CAHOON MRN: [id number] Date of Birth: 25-Mar-1951

## 2021-03-29 ENCOUNTER — Other Ambulatory Visit: Payer: Self-pay

## 2021-03-29 ENCOUNTER — Ambulatory Visit: Payer: HMO

## 2021-03-29 DIAGNOSIS — M6281 Muscle weakness (generalized): Secondary | ICD-10-CM

## 2021-03-29 DIAGNOSIS — Z7409 Other reduced mobility: Secondary | ICD-10-CM | POA: Diagnosis not present

## 2021-03-29 NOTE — Therapy (Signed)
[place], [place], [address] Phone: [phone]   Fax:  [phone]  Physical Therapy Treatment  Patient Details  Name: Anthony [name] MRN: [id number] Date of Birth: 04-Jan-1951 Referring Provider (PT): Zenia Resides, MD   Encounter Date: 03/29/2021   PT End of Session - 03/29/21 1525     Visit Number 5    Number of Visits 17    Date for PT Re-Evaluation 05/07/21    Authorization Type [employer]    Progress Note Due on Visit 10    PT Start Time 1528    PT Stop Time 1608    PT Time Calculation (min) 40 min    Activity Tolerance Patient tolerated treatment well    Behavior During Therapy [facility] for tasks assessed/performed             Past Medical History:  Diagnosis Date   Arthritis    Benign neoplasm of descending colon    Benign neoplasm of sigmoid colon    Benign neoplasm of transverse colon    CAD in native artery    a. reported MI 2000, 2001, s/p stenting of the circumflex lesion extending into the second obtuse marginal branch with a drug-eluting stent in 2003. b. low risk nuc 2015.   Chronic atrial fibrillation (HCC)    Chronic combined systolic and diastolic CHF (congestive heart failure) ([place])    a. Previously diastolic, then EF 85-46% in 10/2016.   CKD (chronic kidney disease), stage III (HCC)    Depression    Diabetes mellitus    Edema of extremities    Erectile dysfunction    GERD (gastroesophageal reflux disease)    Hyperlipidemia    Hypertension    Myocardial infarction ([place]) 2000&2001   Obesity    Onychomycosis    OSA (obstructive sleep apnea)    S/P ICD (internal cardiac defibrillator) procedure and BiV device,  07/25/17 Medtronic  07/26/2017   Seizures ([place])    Sleep apnea    Stroke ([place])    Tubular adenoma of colon 10/2002    Past Surgical History:  Procedure Laterality Date   ANGIOPLASTY  2001   stent x 1   AV NODE ABLATION N/A 08/20/2017   Procedure: AV  NODE ABLATION;  Surgeon: Deboraha Sprang, MD;  Location: [place] [facility];  Service: Cardiovascular;  Laterality: N/A;   BIV ICD INSERTION CRT-D N/A 07/25/2017   Procedure: BIV ICD INSERTION CRT-D;  Surgeon: Deboraha Sprang, MD;  Location: [place] [facility];  Service: Cardiovascular;  Laterality: N/A;   COLONOSCOPY  2000?   negative   COLONOSCOPY WITH PROPOFOL N/A 02/08/2019   Procedure: COLONOSCOPY WITH PROPOFOL;  Surgeon: Ladene Artist, MD;  [facility];  Service: Endoscopy;  Laterality: N/A;   FOOT ARTHROTOMY Right    POLYPECTOMY  02/08/2019   Procedure: POLYPECTOMY;  Surgeon: Ladene Artist, MD;  [facility];  Service: Endoscopy;;   TOE AMPUTATION Left 2012    There were no vitals filed for this visit.   Subjective Assessment - 03/29/21 1525     Subjective Pt presents to PT with reports of low back stiffness, but otherwise no pain. He continues compliant with HEP with no adverse effect. Ready to begin PT at this time.    Currently in Pain? No/denies    Pain Score 0-No pain           [facility] Adult PT Treatment/Exercise:   Therapeutic Exercise:  NuStep  lvl 5 x 4 min UE/LE while taking subjective Step up 2x10 fwd 6in ea STS no UE support 3x10 - from raised table LAQ 3x10 5lb Fwd ball rollouts 2x10 - 3 sec hold Cybex hip abd/ext x 10 ea 17.5lbs  Neuro Re-Ed:b Tandem stance 2x30 sec ea Hurdle step overs fwd ea LE leading; lateral ea x 4 hurdles   Past Interventions Not Performed Today: Bridge 2x10 Supine SLR 2x10 ea 2lbs Supine ball squeeze 2x10 - 5 sec hold Standing hip abd/ext 2x10 at counter Seated hamstring curl 2x10 black tband                               PT Short Term Goals - 03/12/21 1633       PT SHORT TERM GOAL #1   Title Pt will be compliant and knowledgeable with initial HEP for improved comfort and carryover    Baseline initial HEP given    Time 3    Period Weeks    Status New    Target Date  04/02/21               PT Long Term Goals - 03/12/21 1634       PT LONG TERM GOAL #1   Title Pt will improve FOTO score to no less than **% as proxy for functional improvement    Baseline **% function    Time 8    Period Weeks    Status New    Target Date 05/07/21      PT LONG TERM GOAL #2   Title Pt will increase reps in 30 Sec STS to no less than 10 reps for improved balance and mobility    Baseline 7 reps    Time 8    Period Weeks    Status New    Target Date 05/07/21      PT LONG TERM GOAL #3   Title Pt will decrease TUG to no greater than 12 seconds for improved balance and mobility    Baseline 17 sec, increased instability when turning L    Time 8    Period Weeks    Status New    Target Date 05/07/21      PT LONG TERM GOAL #4   Title Pt will increase L hip flex/abd to no less than 5/5 for improved functional mobility    Baseline see flowsheet    Time 8    Period Weeks    Status New    Target Date 05/07/21                   Plan - 03/29/21 1535     Clinical Impression Statement Pt was able to complete prescribed exercises with no adverse effect or change in baseline. Today we continued to focus on improving LE strength, balance, and functional mobility. He continues to progress very well with therapy, should continue to be seen per POC as prescribed.    PT Treatment/Interventions ADLs/Self Care Home Management;Cryotherapy;Moist Heat;Gait training;Stair training;Functional mobility training;Therapeutic activities;Therapeutic exercise;Balance training;Neuromuscular re-education;Patient/family education;Manual techniques;Taping    PT Next Visit Plan standing balance exercises; progress LE strength, gait, and balance as able    PT Home Exercise Plan Access Code: [email or web address]             Patient will benefit from skilled therapeutic intervention in order to improve the following deficits and impairments:  Abnormal gait, Decreased activity tolerance,  Decreased balance,  Decreased endurance, Decreased mobility, Decreased range of motion, Decreased strength, Impaired sensation, Pain  Visit Diagnosis: Impaired functional mobility, balance, gait, and endurance  Muscle weakness (generalized)     Problem List Patient Active Problem List   Diagnosis Date Noted   Hip weakness 02/27/2021   Healthcare maintenance 02/27/2021   Left leg weakness 02/17/2021   COVID-19 vaccine administered 12/27/2020   Neck mass 11/16/2020   Erectile dysfunction 05/31/2020   Polypharmacy 05/22/2020   Porokeratosis 04/26/2020   Sprain of foot 01/25/2020   Neck muscle strain 11/22/2019   Patient cannot afford medications 10/18/2019   Hx of adenomatous colonic polyps    S/P ICD (internal cardiac defibrillator) procedure and BiV device,  07/25/17 Medtronic  07/26/2017   NICM (nonischemic cardiomyopathy) ([place]) 07/25/2017   Congestive heart failure, NYHA class 3, chronic, systolic ([place]) 92/92/4462   Atrial fibrillation, permanent ([place]) 07/25/2017   Memory change    Thrombocytopenia ([place])    Right temporal lobe infarction ([place]) 06/13/2017   Acute kidney injury superimposed on chronic kidney disease ([place]) 06/13/2017   Seizures ([place]) 06/12/2017   Sebaceous cyst of skin of breast, left 04/25/2017   Chronic kidney disease (CKD), stage IV (severe) ([place]) 01/22/2017   Secondary hyperparathyroidism of renal origin ([place]) 01/22/2017   Chronic anticoagulation 03/06/2016   Chronic, continuous use of opioids 10/18/2014   OSA on CPAP 04/04/2014   History of tobacco abuse 06/28/2013   DIABETIC  RETINOPATHY 02/24/2008   Insulin dependent type 2 diabetes mellitus ([place]) 07/24/2006   HYPERCHOLESTEROLEMIA 07/24/2006   Depression 07/24/2006   HYPERTENSION, BENIGN SYSTEMIC 07/24/2006   Coronary atherosclerosis 07/24/2006   GASTROESOPHAGEAL REFLUX, NO ESOPHAGITIS 07/24/2006    Ward Chatters, PT 03/29/2021, 4:15 PM  [place] [facility] [address] [place], [place], [address] Phone: [phone]   Fax:  [phone]  Name: KRISTOFFER BALA MRN: [id number] Date of Birth: 1950/12/01

## 2021-04-03 ENCOUNTER — Ambulatory Visit: Payer: HMO

## 2021-04-03 ENCOUNTER — Other Ambulatory Visit: Payer: Self-pay

## 2021-04-03 DIAGNOSIS — M6281 Muscle weakness (generalized): Secondary | ICD-10-CM

## 2021-04-03 DIAGNOSIS — Z7409 Other reduced mobility: Secondary | ICD-10-CM | POA: Diagnosis not present

## 2021-04-03 NOTE — Therapy (Signed)
[place], [place], [address] Phone: [phone]   Fax:  [phone]  Physical Therapy Treatment  Patient Details  Name: Anthony [name] MRN: [id number] Date of Birth: [birth year] Referring Provider (PT): Zenia Resides, MD   Encounter Date: 04/03/2021   PT End of Session - 04/03/21 1523     Visit Number 6    Number of Visits 17    Date for PT Re-Evaluation 05/07/21    Authorization Type [employer]    Progress Note Due on Visit 10    PT Start Time 1530    PT Stop Time 3474    PT Time Calculation (min) 43 min    Activity Tolerance Patient tolerated treatment well    Behavior During Therapy [facility] for tasks assessed/performed             Past Medical History:  Diagnosis Date   Arthritis    Benign neoplasm of descending colon    Benign neoplasm of sigmoid colon    Benign neoplasm of transverse colon    CAD in native artery    a. reported MI 2000, 2001, s/p stenting of the circumflex lesion extending into the second obtuse marginal branch with a drug-eluting stent in 2003. b. low risk nuc 2015.   Chronic atrial fibrillation (HCC)    Chronic combined systolic and diastolic CHF (congestive heart failure) ([place])    a. Previously diastolic, then EF 25-95% in 10/2016.   CKD (chronic kidney disease), stage III (HCC)    Depression    Diabetes mellitus    Edema of extremities    Erectile dysfunction    GERD (gastroesophageal reflux disease)    Hyperlipidemia    Hypertension    Myocardial infarction ([place]) 2000&2001   Obesity    Onychomycosis    OSA (obstructive sleep apnea)    S/P ICD (internal cardiac defibrillator) procedure and BiV device,  07/25/17 Medtronic  07/26/2017   Seizures ([place])    Sleep apnea    Stroke ([place])    Tubular adenoma of colon 10/2002    Past Surgical History:  Procedure Laterality Date   ANGIOPLASTY  2001   stent x 1   AV NODE ABLATION N/A 08/20/2017   Procedure: AV  NODE ABLATION;  Surgeon: Deboraha Sprang, MD;  Location: [place] [facility];  Service: Cardiovascular;  Laterality: N/A;   BIV ICD INSERTION CRT-D N/A 07/25/2017   Procedure: BIV ICD INSERTION CRT-D;  Surgeon: Deboraha Sprang, MD;  Location: [place] [facility];  Service: Cardiovascular;  Laterality: N/A;   COLONOSCOPY  2000?   negative   COLONOSCOPY WITH PROPOFOL N/A 02/08/2019   Procedure: COLONOSCOPY WITH PROPOFOL;  Surgeon: Ladene Artist, MD;  [facility];  Service: Endoscopy;  Laterality: N/A;   FOOT ARTHROTOMY Right    POLYPECTOMY  02/08/2019   Procedure: POLYPECTOMY;  Surgeon: Ladene Artist, MD;  Location: [facility];  Service: Endoscopy;;   TOE AMPUTATION Left 2012    There were no vitals filed for this visit.   Subjective Assessment - 04/03/21 1523     Subjective Pt presents to PT with no current reports of pain, continues to note L LE weakness. No adverse effects with HEP. Ready to begin PT treatment at this time.    Currently in Pain? No/denies    Pain Score 0-No pain           [facility] Adult PT Treatment/Exercise:   Therapeutic Exercise:  NuStep lvl  5 x 4 min UE/LE while taking subjective Step up x10 fwd 8in ea Lateral walk red tband x 2 laps in // Standing hip ext x 10 red tband Fwd ball rollouts 2x10 - 3 sec hold  Past Interventions Not Performed Today: Bridge 2x10 Supine SLR 2x10 ea 2lbs Supine ball squeeze 2x10 - 5 sec hold Standing hip abd/ext 2x10 at counter Seated hamstring curl 2x10 black tband Cybex hip abd/ext x 10 ea 17.5lbs STS no UE support 3x10 - from raised table LAQ 3x10 5lb  Neuro Re-Ed: Tandem stance 2x30 sec ea Tandem walk x 1 lap in // FT on foam EC 2x30 sec Tandem on foam x 30 sec ea Hurdle step overs fwd ea LE leading; lateral ea x 4 hurdles      [facility] PT Assessment - 04/03/21 0001       Observation/Other Assessments   Focus on Therapeutic Outcomes (FOTO)  46% function                                       PT Short Term Goals - 03/12/21 1633       PT SHORT TERM GOAL #1   Title Pt will be compliant and knowledgeable with initial HEP for improved comfort and carryover    Baseline initial HEP given    Time 3    Period Weeks    Status New    Target Date 04/02/21               PT Long Term Goals - 03/12/21 1634       PT LONG TERM GOAL #1   Title Pt will improve FOTO score to no less than **% as proxy for functional improvement    Baseline **% function    Time 8    Period Weeks    Status New    Target Date 05/07/21      PT LONG TERM GOAL #2   Title Pt will increase reps in 30 Sec STS to no less than 10 reps for improved balance and mobility    Baseline 7 reps    Time 8    Period Weeks    Status New    Target Date 05/07/21      PT LONG TERM GOAL #3   Title Pt will decrease TUG to no greater than 12 seconds for improved balance and mobility    Baseline 17 sec, increased instability when turning L    Time 8    Period Weeks    Status New    Target Date 05/07/21      PT LONG TERM GOAL #4   Title Pt will increase L hip flex/abd to no less than 5/5 for improved functional mobility    Baseline see flowsheet    Time 8    Period Weeks    Status New    Target Date 05/07/21                   Plan - 04/03/21 1532     Clinical Impression Statement Pt was once again able ot complete prescribed exercises with no change in baseline status. Today's session progressed proximal hip strengthening and balance exercises, with pt continuing to demo improving functional mobility and strength. His FOTO function score has increased, moving from 33% at eval to 46% today. He continues to benefit form skilled PT services and will continue to  be seen and progressed as tolerated.    PT Treatment/Interventions ADLs/Self Care Home Management;Cryotherapy;Moist Heat;Gait training;Stair training;Functional mobility training;Therapeutic  activities;Therapeutic exercise;Balance training;Neuromuscular re-education;Patient/family education;Manual techniques;Taping    PT Next Visit Plan standing balance exercises; progress LE strength, gait, and balance as able    PT Home Exercise Plan Access Code: [email or web address]             Patient will benefit from skilled therapeutic intervention in order to improve the following deficits and impairments:  Abnormal gait, Decreased activity tolerance, Decreased balance, Decreased endurance, Decreased mobility, Decreased range of motion, Decreased strength, Impaired sensation, Pain  Visit Diagnosis: Impaired functional mobility, balance, gait, and endurance  Muscle weakness (generalized)     Problem List Patient Active Problem List   Diagnosis Date Noted   Hip weakness 02/27/2021   Healthcare maintenance 02/27/2021   Left leg weakness 02/17/2021   COVID-19 vaccine administered 12/27/2020   Neck mass 11/16/2020   Erectile dysfunction 05/31/2020   Polypharmacy 05/22/2020   Porokeratosis 04/26/2020   Sprain of foot 01/25/2020   Neck muscle strain 11/22/2019   Patient cannot afford medications 10/18/2019   Hx of adenomatous colonic polyps    S/P ICD (internal cardiac defibrillator) procedure and BiV device,  07/25/17 Medtronic  07/26/2017   NICM (nonischemic cardiomyopathy) ([place]) 07/25/2017   Congestive heart failure, NYHA class 3, chronic, systolic ([place]) 74/73/4037   Atrial fibrillation, permanent ([place]) 07/25/2017   Memory change    Thrombocytopenia ([place])    Right temporal lobe infarction ([place]) 06/13/2017   Acute kidney injury superimposed on chronic kidney disease ([place]) 06/13/2017   Seizures ([place]) 06/12/2017   Sebaceous cyst of skin of breast, left 04/25/2017   Chronic kidney disease (CKD), stage IV (severe) ([place]) 01/22/2017   Secondary hyperparathyroidism of renal origin ([place]) 01/22/2017   Chronic anticoagulation 03/06/2016   Chronic, continuous use of opioids 10/18/2014   OSA on  CPAP 04/04/2014   History of tobacco abuse 06/28/2013   DIABETIC  RETINOPATHY 02/24/2008   Insulin dependent type 2 diabetes mellitus ([place]) 07/24/2006   HYPERCHOLESTEROLEMIA 07/24/2006   Depression 07/24/2006   HYPERTENSION, BENIGN SYSTEMIC 07/24/2006   Coronary atherosclerosis 07/24/2006   GASTROESOPHAGEAL REFLUX, NO ESOPHAGITIS 07/24/2006    Ward Chatters, PT 04/03/2021, 4:17 PM  [facility] [address] [place], [place], [address] Phone: [phone]   Fax:  [phone]  Name: LENON KUENNEN MRN: [id number] Date of Birth: 03-19-1951

## 2021-04-03 NOTE — Progress Notes (Signed)
RE- ENROLLMENT application submitted  for [employer] to [place] for patient assistance.   NOVO [place] [phone] LILLY CARES [phone]

## 2021-04-04 LAB — CUP PACEART INCLINIC DEVICE CHECK
Battery Remaining Longevity: 15 mo
Battery Voltage: 2.89 V
Brady Statistic AP VP Percent: 0 %
Brady Statistic AP VS Percent: 0 %
Brady Statistic AS VP Percent: 0 %
Brady Statistic AS VS Percent: 0 %
Brady Statistic RA Percent Paced: 0 %
Brady Statistic RV Percent Paced: 99.46 %
Date Time Interrogation Session: 20221031131900
HighPow Impedance: 70 Ohm
Implantable Lead Implant Date: 20190301
Implantable Lead Implant Date: 20190301
Implantable Lead Location: 753858
Implantable Lead Location: 753860
Implantable Pulse Generator Implant Date: 20190301
Lead Channel Impedance Value: 1026 Ohm
Lead Channel Impedance Value: 1064 Ohm
Lead Channel Impedance Value: 1064 Ohm
Lead Channel Impedance Value: 1064 Ohm
Lead Channel Impedance Value: 1083 Ohm
Lead Channel Impedance Value: 1140 Ohm
Lead Channel Impedance Value: 304 Ohm
Lead Channel Impedance Value: 304 Ohm
Lead Channel Impedance Value: 304 Ohm
Lead Channel Impedance Value: 313.212
Lead Channel Impedance Value: 313.212
Lead Channel Impedance Value: 361 Ohm
Lead Channel Impedance Value: 4047 Ohm
Lead Channel Impedance Value: 456 Ohm
Lead Channel Impedance Value: 608 Ohm
Lead Channel Impedance Value: 608 Ohm
Lead Channel Impedance Value: 608 Ohm
Lead Channel Impedance Value: 646 Ohm
Lead Channel Pacing Threshold Amplitude: 0.5 V
Lead Channel Pacing Threshold Amplitude: 3.25 V
Lead Channel Pacing Threshold Pulse Width: 0.4 ms
Lead Channel Pacing Threshold Pulse Width: 1.5 ms
Lead Channel Sensing Intrinsic Amplitude: 3.875 mV
Lead Channel Sensing Intrinsic Amplitude: 3.875 mV
Lead Channel Setting Pacing Amplitude: 2.5 V
Lead Channel Setting Pacing Amplitude: 3.5 V
Lead Channel Setting Pacing Pulse Width: 0.4 ms
Lead Channel Setting Pacing Pulse Width: 1.5 ms
Lead Channel Setting Sensing Sensitivity: 0.3 mV

## 2021-04-05 ENCOUNTER — Other Ambulatory Visit: Payer: Self-pay

## 2021-04-05 ENCOUNTER — Ambulatory Visit: Payer: HMO

## 2021-04-05 DIAGNOSIS — Z7409 Other reduced mobility: Secondary | ICD-10-CM

## 2021-04-05 DIAGNOSIS — M6281 Muscle weakness (generalized): Secondary | ICD-10-CM

## 2021-04-05 NOTE — Therapy (Signed)
[place], [place], [address] Phone: [phone]   Fax:  [phone]  Physical Therapy Treatment  Patient Details  Name: Anthony [name] MRN: [id number] Date of Birth: 1950-07-23 Referring Provider (PT): Zenia Resides, MD   Encounter Date: 04/05/2021   PT End of Session - 04/05/21 1533     Visit Number 7    Number of Visits 17    Date for PT Re-Evaluation 05/07/21    Authorization Type [employer]    Progress Note Due on Visit 10    PT Start Time 1530    PT Stop Time 1612    PT Time Calculation (min) 42 min    Activity Tolerance Patient tolerated treatment well    Behavior During Therapy [facility] for tasks assessed/performed             Past Medical History:  Diagnosis Date   Arthritis    Benign neoplasm of descending colon    Benign neoplasm of sigmoid colon    Benign neoplasm of transverse colon    CAD in native artery    a. reported MI 2000, 2001, s/p stenting of the circumflex lesion extending into the second obtuse marginal branch with a drug-eluting stent in 2003. b. low risk nuc 2015.   Chronic atrial fibrillation (HCC)    Chronic combined systolic and diastolic CHF (congestive heart failure) ([place])    a. Previously diastolic, then EF 14-97% in 10/2016.   CKD (chronic kidney disease), stage III (HCC)    Depression    Diabetes mellitus    Edema of extremities    Erectile dysfunction    GERD (gastroesophageal reflux disease)    Hyperlipidemia    Hypertension    Myocardial infarction ([place]) 2000&2001   Obesity    Onychomycosis    OSA (obstructive sleep apnea)    S/P ICD (internal cardiac defibrillator) procedure and BiV device,  07/25/17 Medtronic  07/26/2017   Seizures ([place])    Sleep apnea    Stroke ([place])    Tubular adenoma of colon 10/2002    Past Surgical History:  Procedure Laterality Date   ANGIOPLASTY  2001   stent x 1   AV NODE ABLATION N/A 08/20/2017   Procedure: AV  NODE ABLATION;  Surgeon: Deboraha Sprang, MD;  Location: [place] [facility];  Service: Cardiovascular;  Laterality: N/A;   BIV ICD INSERTION CRT-D N/A 07/25/2017   Procedure: BIV ICD INSERTION CRT-D;  Surgeon: Deboraha Sprang, MD;  Location: [place] [facility];  Service: Cardiovascular;  Laterality: N/A;   COLONOSCOPY  2000?   negative   COLONOSCOPY WITH PROPOFOL N/A 02/08/2019   Procedure: COLONOSCOPY WITH PROPOFOL;  Surgeon: Ladene Artist, MD;  [facility];  Service: Endoscopy;  Laterality: N/A;   FOOT ARTHROTOMY Right    POLYPECTOMY  02/08/2019   Procedure: POLYPECTOMY;  Surgeon: Ladene Artist, MD;  [facility];  Service: Endoscopy;;   TOE AMPUTATION Left 2012    There were no vitals filed for this visit.   Subjective Assessment - 04/05/21 1533     Subjective Pt presents to PT with no current reports of pain, did have some muscle soreness after last session. Has been compliant with HEP, no adverse effects. Ready to begin PT at this time.    Currently in Pain? No/denies    Pain Score 0-No pain           [facility] Adult PT Treatment/Exercise:   Therapeutic  Exercise:  NuStep lvl 5 x 4 min UE/LE while taking subjective STS 2x10 10lb KB LAQ 3x10 5lb Bridge 2x10 Supine SLR 2x15 ea Step up x10 fwd 8in ea Lateral walk red tband x 3 laps in // Standing hip ext 2x10 red tband Fwd ball rollouts 2x10 - 3 sec hold   Past Interventions Not Performed Today: Supine ball squeeze 2x10 - 5 sec hold Standing hip abd/ext 2x10 at counter Seated hamstring curl 2x10 black tband Cybex hip abd/ext x 10 ea 17.5lbs                               PT Short Term Goals - 03/12/21 1633       PT SHORT TERM GOAL #1   Title Pt will be compliant and knowledgeable with initial HEP for improved comfort and carryover    Baseline initial HEP given    Time 3    Period Weeks    Status New    Target Date 04/02/21               PT Long Term Goals -  03/12/21 1634       PT LONG TERM GOAL #1   Title Pt will improve FOTO score to no less than **% as proxy for functional improvement    Baseline **% function    Time 8    Period Weeks    Status New    Target Date 05/07/21      PT LONG TERM GOAL #2   Title Pt will increase reps in 30 Sec STS to no less than 10 reps for improved balance and mobility    Baseline 7 reps    Time 8    Period Weeks    Status New    Target Date 05/07/21      PT LONG TERM GOAL #3   Title Pt will decrease TUG to no greater than 12 seconds for improved balance and mobility    Baseline 17 sec, increased instability when turning L    Time 8    Period Weeks    Status New    Target Date 05/07/21      PT LONG TERM GOAL #4   Title Pt will increase L hip flex/abd to no less than 5/5 for improved functional mobility    Baseline see flowsheet    Time 8    Period Weeks    Status New    Target Date 05/07/21                   Plan - 04/05/21 1544     Clinical Impression Statement Pt was able to complete all his prescribed exercises with no adverse effect. Today we continued to progress exercises focused on LE strengthening in multiple positions. He continues to progress well with therapy, showing improving functional activity tolerance during today's session. Pt continues to require skilled PT services working on L LE strengthening in order to improve mobility. Will continue to progress as tolerated per POC.    PT Treatment/Interventions ADLs/Self Care Home Management;Cryotherapy;Moist Heat;Gait training;Stair training;Functional mobility training;Therapeutic activities;Therapeutic exercise;Balance training;Neuromuscular re-education;Patient/family education;Manual techniques;Taping    PT Next Visit Plan standing balance exercises; progress LE strength, gait, and balance as able    PT Home Exercise Plan Access Code: [email or web address]             Patient will benefit from skilled therapeutic intervention in  order to improve  the following deficits and impairments:  Abnormal gait, Decreased activity tolerance, Decreased balance, Decreased endurance, Decreased mobility, Decreased range of motion, Decreased strength, Impaired sensation, Pain  Visit Diagnosis: Impaired functional mobility, balance, gait, and endurance  Muscle weakness (generalized)     Problem List Patient Active Problem List   Diagnosis Date Noted   Hip weakness 02/27/2021   Healthcare maintenance 02/27/2021   Left leg weakness 02/17/2021   COVID-19 vaccine administered 12/27/2020   Neck mass 11/16/2020   Erectile dysfunction 05/31/2020   Polypharmacy 05/22/2020   Porokeratosis 04/26/2020   Sprain of foot 01/25/2020   Neck muscle strain 11/22/2019   Patient cannot afford medications 10/18/2019   Hx of adenomatous colonic polyps    S/P ICD (internal cardiac defibrillator) procedure and BiV device,  07/25/17 Medtronic  07/26/2017   NICM (nonischemic cardiomyopathy) ([place]) 07/25/2017   Congestive heart failure, NYHA class 3, chronic, systolic ([place]) 01/74/9449   Atrial fibrillation, permanent ([place]) 07/25/2017   Memory change    Thrombocytopenia ([place])    Right temporal lobe infarction ([place]) 06/13/2017   Acute kidney injury superimposed on chronic kidney disease ([place]) 06/13/2017   Seizures ([place]) 06/12/2017   Sebaceous cyst of skin of breast, left 04/25/2017   Chronic kidney disease (CKD), stage IV (severe) ([place]) 01/22/2017   Secondary hyperparathyroidism of renal origin ([place]) 01/22/2017   Chronic anticoagulation 03/06/2016   Chronic, continuous use of opioids 10/18/2014   OSA on CPAP 04/04/2014   History of tobacco abuse 06/28/2013   DIABETIC  RETINOPATHY 02/24/2008   Insulin dependent type 2 diabetes mellitus ([place]) 07/24/2006   HYPERCHOLESTEROLEMIA 07/24/2006   Depression 07/24/2006   HYPERTENSION, BENIGN SYSTEMIC 07/24/2006   Coronary atherosclerosis 07/24/2006   GASTROESOPHAGEAL REFLUX, NO ESOPHAGITIS 07/24/2006     Ward Chatters, PT 04/05/2021, [facility] [address] [place], [place], [address] Phone: [phone]   Fax:  [phone]  Name: Anthony [name] MRN: [id number] Date of Birth: [birth year]

## 2021-04-09 ENCOUNTER — Encounter: Payer: Self-pay | Admitting: Pharmacist

## 2021-04-09 ENCOUNTER — Ambulatory Visit (INDEPENDENT_AMBULATORY_CARE_PROVIDER_SITE_OTHER): Payer: HMO | Admitting: Pharmacist

## 2021-04-09 ENCOUNTER — Other Ambulatory Visit: Payer: Self-pay

## 2021-04-09 DIAGNOSIS — Z794 Long term (current) use of insulin: Secondary | ICD-10-CM | POA: Diagnosis not present

## 2021-04-09 DIAGNOSIS — E119 Type 2 diabetes mellitus without complications: Secondary | ICD-10-CM | POA: Diagnosis not present

## 2021-04-09 DIAGNOSIS — I10 Essential (primary) hypertension: Secondary | ICD-10-CM | POA: Diagnosis not present

## 2021-04-09 MED ORDER — LOSARTAN POTASSIUM 25 MG PO TABS: 25.0000 mg | ORAL_TABLET | Freq: Every day | ORAL | 3 refills | Status: DC

## 2021-04-09 MED ORDER — TRESIBA FLEXTOUCH 200 UNIT/ML ~~LOC~~ SOPN: 30.0000 [IU] | PEN_INJECTOR | Freq: Every day | SUBCUTANEOUS | Status: DC

## 2021-04-09 NOTE — Assessment & Plan Note (Signed)
Hypertension with CKD longstanding currently uncontrolled blood pressure and appears adherent with carvedilol and hydralazine. -Restart losartan 25 mg daily which was stopped in September  - Continue to follow BMET and blood pressure at PCP follow-up with Dr. Posey Pronto

## 2021-04-09 NOTE — Progress Notes (Signed)
S:     Chief Complaint  Patient presents with   Medication Management    Diabetes    Patient arrives in a pleasant mood, ambulating without assistance.  Presents for diabetes evaluation, education, and management Patient was referred on 02/27/2021 by Dr. Chauncey Reading.  Patient was last seen by Primary Care Provider Dr. Posey Pronto on 12/27/2020.   Medication adherence reported good  Current diabetes medications include: Tresiba (insulin degludec) 32 units daily, Novolog (insulin aspart) 10-15 units with biggest meal (usually dinner)  Current hypertension medications include: hydralazine 50 mg TID, carvedilol 25 mg BID Current hyperlipidemia medications include: rosuvastatin 20 mg daily   Patient reports hypoglycemic events ~2x/week. Based on [place] report, it appears that the hypoglycemic events occur around 9-10 AM, as the patient does not usually eat anything in the morning, instead he eats his "breakfast" later in the morning, closer to lunch time.   [facility] A[facility] report shows that the patient has blood glucose in goal for ~94% of the day, with an average glucose of 143 mg/dL and GMI of 6.7%.   Patient reported dietary habits: Eats ~2 meals/day Breakfast: cereal, grits, usually eats later in the day (replaces lunch) Dinner: potatoes, yams,  Snacks: pie  O:  Physical Exam Constitutional:      Appearance: Normal appearance. He is obese.  Pulmonary:     Effort: Pulmonary effort is normal.  Neurological:     Mental Status: He is alert.  Psychiatric:        Mood and Affect: Mood normal.        Behavior: Behavior normal.        Thought Content: Thought content normal.    Review of Systems  All other systems reviewed and are negative.  Lab Results  Component Value Date   HGBA1C 9.1 (A) 12/27/2020   Vitals:   04/09/21 1006  BP: (!) 152/90  Pulse: 75  SpO2: 98%    Lipid Panel     Component Value Date/Time   CHOL 139 11/16/2020 0954   TRIG 92 11/16/2020 0954   HDL 35 (L)  11/16/2020 0954   CHOLHDL 4.0 11/16/2020 0954   CHOLHDL 3.2 02/28/2016 0906   VLDL 19 02/28/2016 0906   LDLCALC 86 11/16/2020 0954   LDLDIRECT 58 11/06/2011 1434    Clinical Atherosclerotic Cardiovascular Disease (ASCVD): Yes  The 10-year ASCVD risk score (Arnett DK, et al., 2019) is: 41.6%   Values used to calculate the score:     Age: 23 years     Sex: Male     Is Non-Hispanic African American: Yes     Diabetic: Yes     Tobacco smoker: No     Systolic Blood Pressure: 706 mmHg     Is BP treated: Yes     HDL Cholesterol: 35 mg/dL     Total Cholesterol: 139 mg/dL   A/P: Diabetes longstanding currently with improved control. Patient is able to verbalize appropriate hypoglycemia management plan. Medication adherence appears good. Due to excellent blood glucose readings, however, with relatively frequent (2-3 times per week) hypoglycemic events occurring in late mornings, a decrease in basal insulin is needed.  -Decreased dose of basal insulin Tresiba (insulin degludec) from 32 to 30 units daily  -Recommended eating a small meal in the morning, with foods like peanut butter and crackers to avoid hypoglycemic events in the late mornings.  -Continued  rapid insulin Novolog (insulin asapart) 10-15 units with largest meal of the day -Congratulated patient on improved control  -  Extensively discussed pathophysiology of diabetes, recommended lifestyle interventions, dietary effects on blood sugar control -Counseled on s/sx of and management of hypoglycemia -Next A1C anticipated January 2023.   Hypertension with CKD longstanding currently uncontrolled blood pressure and appears adherent with carvedilol and hydralazine. -Restart losartan 25 mg daily which was stopped in September  - Continue to follow BMET and blood pressure at PCP follow-up with Dr. Posey Pronto  Written patient instructions provided.  Total time in face to face counseling 30 minutes.   Follow up PCP Clinic Visit in January 2023.  Patient seen with Elyse Jarvis, PharmD Candidate.

## 2021-04-09 NOTE — Progress Notes (Signed)
Reviewed: I agree with Dr. Koval's documentation and management. 

## 2021-04-09 NOTE — Assessment & Plan Note (Signed)
Diabetes longstanding currently with improved control. Patient is able to verbalize appropriate hypoglycemia management plan. Medication adherence appears good. Due to excellent blood glucose readings, however, with relatively frequent (2-3 times per week) hypoglycemic events occurring in late mornings, a decrease in basal insulin is needed.  -Decreased dose of basal insulin Tresiba (insulin degludec) from 32 to 30 units daily  -Recommended eating a small meal in the morning, with foods like peanut butter and crackers to avoid hypoglycemic events in the late mornings.  -Continued  rapid insulin Novolog (insulin asapart) 10-15 units with largest meal of the day -Congratulated patient on improved control  -Extensively discussed pathophysiology of diabetes, recommended lifestyle interventions, dietary effects on blood sugar control -Counseled on s/sx of and management of hypoglycemia -Next A1C anticipated January 2023.

## 2021-04-09 NOTE — Patient Instructions (Addendum)
Nice to see you today.  Your [facility] report shows you are doing very well.   As we discussed avoiding low readings throughout the day is ideal.   Decrease your Tresiba (insulin degludec) from 32 units to 30 units.    Next appointment with Dr. Posey Pronto in January.

## 2021-04-10 ENCOUNTER — Ambulatory Visit: Payer: HMO

## 2021-04-10 DIAGNOSIS — Z7409 Other reduced mobility: Secondary | ICD-10-CM

## 2021-04-10 DIAGNOSIS — M6281 Muscle weakness (generalized): Secondary | ICD-10-CM

## 2021-04-10 NOTE — Therapy (Signed)
[place], [place], [address] Phone: [phone]   Fax:  [phone]  Physical Therapy Treatment  Patient Details  Name: Anthony [name] MRN: [id number] Date of Birth: [birth year] Referring Provider (PT): Zenia Resides, MD   Encounter Date: 04/10/2021   PT End of Session - 04/10/21 1355     Visit Number 8    Number of Visits 17    Date for PT Re-Evaluation 05/07/21    Authorization Type [employer]    Progress Note Due on Visit 10    PT Start Time 2094    PT Stop Time 1440    PT Time Calculation (min) 42 min    Activity Tolerance Patient tolerated treatment well    Behavior During Therapy [facility] for tasks assessed/performed             Past Medical History:  Diagnosis Date   Arthritis    Benign neoplasm of descending colon    Benign neoplasm of sigmoid colon    Benign neoplasm of transverse colon    CAD in native artery    a. reported MI 2000, 2001, s/p stenting of the circumflex lesion extending into the second obtuse marginal branch with a drug-eluting stent in 2003. b. low risk nuc 2015.   Chronic atrial fibrillation (HCC)    Chronic combined systolic and diastolic CHF (congestive heart failure) ([place])    a. Previously diastolic, then EF 70-96% in 10/2016.   CKD (chronic kidney disease), stage III (HCC)    Depression    Diabetes mellitus    Edema of extremities    Erectile dysfunction    GERD (gastroesophageal reflux disease)    Hyperlipidemia    Hypertension    Myocardial infarction ([place]) 2000&2001   Obesity    Onychomycosis    OSA (obstructive sleep apnea)    S/P ICD (internal cardiac defibrillator) procedure and BiV device,  07/25/17 Medtronic  07/26/2017   Seizures ([place])    Sleep apnea    Stroke ([place])    Tubular adenoma of colon 10/2002    Past Surgical History:  Procedure Laterality Date   ANGIOPLASTY  2001   stent x 1   AV NODE ABLATION N/A 08/20/2017   Procedure: AV  NODE ABLATION;  Surgeon: Deboraha Sprang, MD;  Location: [place] [facility];  Service: Cardiovascular;  Laterality: N/A;   BIV ICD INSERTION CRT-D N/A 07/25/2017   Procedure: BIV ICD INSERTION CRT-D;  Surgeon: Deboraha Sprang, MD;  Location: [place] [facility];  Service: Cardiovascular;  Laterality: N/A;   COLONOSCOPY  2000?   negative   COLONOSCOPY WITH PROPOFOL N/A 02/08/2019   Procedure: COLONOSCOPY WITH PROPOFOL;  Surgeon: Ladene Artist, MD;  [facility];  Service: Endoscopy;  Laterality: N/A;   FOOT ARTHROTOMY Right    POLYPECTOMY  02/08/2019   Procedure: POLYPECTOMY;  Surgeon: Ladene Artist, MD;  [facility];  Service: Endoscopy;;   TOE AMPUTATION Left 2012    There were no vitals filed for this visit.   Subjective Assessment - 04/10/21 1358     Subjective Pt presents to PT with no current pain, does note some lower back tightness. Has been compliant with HEP. Ready to begin PT at this time.    Currently in Pain? No/denies    Pain Score 0-No pain           [facility] Adult PT Treatment/Exercise:   Therapeutic Exercise:  NuStep lvl 5 x 4  min UE/LE while taking subjective STS 2x10 15lb KB Step up 2x10 fwd 8in ea Lateral walk green tband x 3 laps in // Fwd ball rollouts 2x10 - 3 sec hold   Past Interventions Not Performed Today: Supine ball squeeze 2x10 - 5 sec hold Standing hip abd/ext 2x10 at counter Seated hamstring curl 2x10 black tband Cybex hip abd/ext x 10 ea 17.5lbs Standing hip ext 2x10 red tband LAQ 3x10 5lb Bridge 2x10 Supine SLR 2x15 ea  Neuro Re-Ed: High march in // x 2 laps Tandem walk x 1 lap in // Tandem on foam x 30 sec ea Hurdle step overs fwd ea LE leading; lateral ea x 5 hurdles                              PT Short Term Goals - 03/12/21 1633       PT SHORT TERM GOAL #1   Title Pt will be compliant and knowledgeable with initial HEP for improved comfort and carryover    Baseline initial HEP given     Time 3    Period Weeks    Status New    Target Date 04/02/21               PT Long Term Goals - 03/12/21 1634       PT LONG TERM GOAL #1   Title Pt will improve FOTO score to no less than **% as proxy for functional improvement    Baseline **% function    Time 8    Period Weeks    Status New    Target Date 05/07/21      PT LONG TERM GOAL #2   Title Pt will increase reps in 30 Sec STS to no less than 10 reps for improved balance and mobility    Baseline 7 reps    Time 8    Period Weeks    Status New    Target Date 05/07/21      PT LONG TERM GOAL #3   Title Pt will decrease TUG to no greater than 12 seconds for improved balance and mobility    Baseline 17 sec, increased instability when turning L    Time 8    Period Weeks    Status New    Target Date 05/07/21      PT LONG TERM GOAL #4   Title Pt will increase L hip flex/abd to no less than 5/5 for improved functional mobility    Baseline see flowsheet    Time 8    Period Weeks    Status New    Target Date 05/07/21                   Plan - 04/10/21 1407     Clinical Impression Statement Pt was able to complete prescribed exercises today with no adverse effect. Pt continues to progress with therapy, increasing difficulty of balance exercises. He still loses his balance, espeically towards L in L LE stance. Today we continued to focus on improving LE strength and balance. He continues to benefit from skilled PT services working on improving strength, mobility, and safety and will continue to be seen and progressed as able.    PT Treatment/Interventions ADLs/Self Care Home Management;Cryotherapy;Moist Heat;Gait training;Stair training;Functional mobility training;Therapeutic activities;Therapeutic exercise;Balance training;Neuromuscular re-education;Patient/family education;Manual techniques;Taping    PT Next Visit Plan standing balance exercises; progress LE strength, gait, and balance as able  PT Home  Exercise Plan Access Code: [email or web address]             Patient will benefit from skilled therapeutic intervention in order to improve the following deficits and impairments:  Abnormal gait, Decreased activity tolerance, Decreased balance, Decreased endurance, Decreased mobility, Decreased range of motion, Decreased strength, Impaired sensation, Pain  Visit Diagnosis: Impaired functional mobility, balance, gait, and endurance  Muscle weakness (generalized)     Problem List Patient Active Problem List   Diagnosis Date Noted   Hip weakness 02/27/2021   Healthcare maintenance 02/27/2021   Left leg weakness 02/17/2021   COVID-19 vaccine administered 12/27/2020   Neck mass 11/16/2020   Erectile dysfunction 05/31/2020   Polypharmacy 05/22/2020   Porokeratosis 04/26/2020   Sprain of foot 01/25/2020   Neck muscle strain 11/22/2019   Patient cannot afford medications 10/18/2019   Hx of adenomatous colonic polyps    S/P ICD (internal cardiac defibrillator) procedure and BiV device,  07/25/17 Medtronic  07/26/2017   NICM (nonischemic cardiomyopathy) ([place]) 07/25/2017   Congestive heart failure, NYHA class 3, chronic, systolic ([place]) 24/40/1027   Atrial fibrillation, permanent ([place]) 07/25/2017   Memory change    Thrombocytopenia ([place])    Right temporal lobe infarction ([place]) 06/13/2017   Acute kidney injury superimposed on chronic kidney disease ([place]) 06/13/2017   Seizures ([place]) 06/12/2017   Sebaceous cyst of skin of breast, left 04/25/2017   Chronic kidney disease (CKD), stage IV (severe) ([place]) 01/22/2017   Secondary hyperparathyroidism of renal origin ([place]) 01/22/2017   Chronic anticoagulation 03/06/2016   Chronic, continuous use of opioids 10/18/2014   OSA on CPAP 04/04/2014   History of tobacco abuse 06/28/2013   DIABETIC  RETINOPATHY 02/24/2008   Insulin dependent type 2 diabetes mellitus ([place]) 07/24/2006   HYPERCHOLESTEROLEMIA 07/24/2006   Depression 07/24/2006   HYPERTENSION,  BENIGN SYSTEMIC 07/24/2006   Coronary atherosclerosis 07/24/2006   GASTROESOPHAGEAL REFLUX, NO ESOPHAGITIS 07/24/2006    Ward Chatters, PT 04/10/2021, 2:42 PM  [facility] [address] [place], [place], [address] Phone: [phone]   Fax:  [phone]  Name: KLINT LEZCANO MRN: [id number] Date of Birth: 1950-07-20

## 2021-04-12 ENCOUNTER — Other Ambulatory Visit: Payer: Self-pay

## 2021-04-12 ENCOUNTER — Ambulatory Visit: Payer: HMO

## 2021-04-12 DIAGNOSIS — Z7409 Other reduced mobility: Secondary | ICD-10-CM

## 2021-04-12 DIAGNOSIS — M6281 Muscle weakness (generalized): Secondary | ICD-10-CM

## 2021-04-12 NOTE — Therapy (Addendum)
[place], [place], [address] Phone: [phone]   Fax:  [phone]  Physical Therapy Treatment/Progress Not  Progress Note Reporting Period 03/12/21 to 04/12/21  See note below for Objective Data and Assessment of Progress/Goals.      Patient Details  Name: Anthony [name] MRN: [id number] Date of Birth: Dec 18, 1950 Referring Provider (PT): Zenia Resides, MD   Encounter Date: 04/12/2021   PT End of Session - 04/12/21 1448     Visit Number 9    Number of Visits 17    Date for PT Re-Evaluation 06/07/21    Authorization Type [employer]    Progress Note Due on Visit 10    PT Start Time 1448    PT Stop Time 1520    PT Time Calculation (min) 32 min    Activity Tolerance Patient tolerated treatment well    Behavior During Therapy [facility] for tasks assessed/performed             Past Medical History:  Diagnosis Date   Arthritis    Benign neoplasm of descending colon    Benign neoplasm of sigmoid colon    Benign neoplasm of transverse colon    CAD in native artery    a. reported MI 2000, 2001, s/p stenting of the circumflex lesion extending into the second obtuse marginal branch with a drug-eluting stent in 2003. b. low risk nuc 2015.   Chronic atrial fibrillation (HCC)    Chronic combined systolic and diastolic CHF (congestive heart failure) ([place])    a. Previously diastolic, then EF 03-00% in 10/2016.   CKD (chronic kidney disease), stage III (HCC)    Depression    Diabetes mellitus    Edema of extremities    Erectile dysfunction    GERD (gastroesophageal reflux disease)    Hyperlipidemia    Hypertension    Myocardial infarction ([place]) 2000&2001   Obesity    Onychomycosis    OSA (obstructive sleep apnea)    S/P ICD (internal cardiac defibrillator) procedure and BiV device,  07/25/17 Medtronic  07/26/2017   Seizures ([place])    Sleep apnea    Stroke ([place])    Tubular adenoma of colon  10/2002    Past Surgical History:  Procedure Laterality Date   ANGIOPLASTY  2001   stent x 1   AV NODE ABLATION N/A 08/20/2017   Procedure: AV NODE ABLATION;  Surgeon: Deboraha Sprang, MD;  Location: [place] [facility];  Service: Cardiovascular;  Laterality: N/A;   BIV ICD INSERTION CRT-D N/A 07/25/2017   Procedure: BIV ICD INSERTION CRT-D;  Surgeon: Deboraha Sprang, MD;  Location: [place] [facility];  Service: Cardiovascular;  Laterality: N/A;   COLONOSCOPY  2000?   negative   COLONOSCOPY WITH PROPOFOL N/A 02/08/2019   Procedure: COLONOSCOPY WITH PROPOFOL;  Surgeon: Ladene Artist, MD;  [facility];  Service: Endoscopy;  Laterality: N/A;   FOOT ARTHROTOMY Right    POLYPECTOMY  02/08/2019   Procedure: POLYPECTOMY;  Surgeon: Ladene Artist, MD;  [facility];  Service: Endoscopy;;   TOE AMPUTATION Left 2012    There were no vitals filed for this visit.   Subjective Assessment - 04/12/21 1448     Subjective Pt presetns to PT noting he is not feeling too well today, also noting increased bilateral LE swelling. Denies that this is a first time occurance, but states he is going to call his PCP and cardiologist. He is ready  to begin PT treatment at this time.    Currently in Pain? No/denies    Pain Score 0-No pain           [facility] Adult PT Treatment/Exercise:   Therapeutic Exercise:  STS 2x10 15lb KB Standing hip abd/ext 2x10 ea Tandem stance x 30" ea  Manual Therapy: N/A   Neuromuscular re-ed: N/A   Therapeutic Activity: Assessment of tests/measures, outcomes, and goals for purposes of progress note   Modalities: N/A   Self Care: N/A   Consider / progression for next session:      [facility] PT Assessment - 04/12/21 0001       Observation/Other Assessments   Focus on Therapeutic Outcomes (FOTO)  54% function      Sit to Stand   Comments 30 Sec STS: 9 reps      Strength   Right Hip ABduction 4/5    Left Hip Flexion 4/5      Timed Up and Go  Test   Normal TUG (seconds) 12                                    PT Education - 04/12/21 1525     Education Details HEP update and POC    Person(s) Educated Patient    Methods Explanation;Demonstration;Handout    Comprehension Verbalized understanding;Returned demonstration              PT Short Term Goals - 04/12/21 1524       PT SHORT TERM GOAL #1   Title Pt will be compliant and knowledgeable with initial HEP for improved comfort and carryover    Baseline initial HEP given    Time 3    Period Weeks    Status Achieved    Target Date 04/02/21               PT Long Term Goals - 04/12/21 1457       PT LONG TERM GOAL #1   Title Pt will improve FOTO score to no less than 59% as proxy for functional improvement    Baseline 54% function    Time 8    Period Weeks    Status On-going    Target Date 06/07/21      PT LONG TERM GOAL #2   Title Pt will increase reps in 30 Sec STS to no less than 10 reps for improved balance and mobility    Baseline 7 reps; 9 reps on 04/12/21    Time 8    Period Weeks    Status On-going    Target Date 06/07/21      PT LONG TERM GOAL #3   Title Pt will decrease TUG to no greater than 12 seconds for improved balance and mobility    Baseline 17 sec, increased instability when turning L - update on 11/17 - 12 sec    Time 8    Period Weeks    Status Achieved      PT LONG TERM GOAL #4   Title Pt will increase L hip flex/abd to no less than 5/5 for improved functional mobility    Baseline see flowsheet    Time 8    Period Weeks    Status On-going    Target Date 06/07/21                   Plan - 04/12/21 1525  Clinical Impression Statement Pt was able to complete prescribed exercises and showed knowledge of new HEP with no adverse effect. Over the course of PT treatment, he has made progress towards meeting goals and improving general functional mobility. He has decreased his TUG, meeting his  LTG and showing improved balance and mobility. Likewise he has also made progress towards meeting FOTO and 30 Sec STS, as well as full WNL L LE strength. He would continue to benefit from skilled PT services, with POC extension and decreased frequency. PT ended session early due to pt not feeling well, with HEP updated for continued strengthening and improving balance. Will continue to progress as tolerated per POC.    Personal Factors and Comorbidities Comorbidity 3+;Fitness    Comorbidities PMH: AFib, CKD, DM II, HTN, MI, CVA, ICD implantation    PT Frequency 1x / week    PT Duration 8 weeks    PT Treatment/Interventions ADLs/Self Care Home Management;Cryotherapy;Moist Heat;Gait training;Stair training;Functional mobility training;Therapeutic activities;Therapeutic exercise;Balance training;Neuromuscular re-education;Patient/family education;Manual techniques;Taping    PT Next Visit Plan standing balance exercises; progress LE strength, gait, and balance as able    PT Home Exercise Plan Access Code: [email or web address]             Patient will benefit from skilled therapeutic intervention in order to improve the following deficits and impairments:  Abnormal gait, Decreased activity tolerance, Decreased balance, Decreased endurance, Decreased mobility, Decreased range of motion, Decreased strength, Impaired sensation, Pain  Visit Diagnosis: Impaired functional mobility, balance, gait, and endurance - Plan: PT plan of care cert/re-cert  Muscle weakness (generalized) - Plan: PT plan of care cert/re-cert     Problem List Patient Active Problem List   Diagnosis Date Noted   Hip weakness 02/27/2021   Healthcare maintenance 02/27/2021   Left leg weakness 02/17/2021   COVID-19 vaccine administered 12/27/2020   Neck mass 11/16/2020   Erectile dysfunction 05/31/2020   Polypharmacy 05/22/2020   Porokeratosis 04/26/2020   Sprain of foot 01/25/2020   Neck muscle strain 11/22/2019   Patient cannot  afford medications 10/18/2019   Hx of adenomatous colonic polyps    S/P ICD (internal cardiac defibrillator) procedure and BiV device,  07/25/17 Medtronic  07/26/2017   NICM (nonischemic cardiomyopathy) ([place]) 07/25/2017   Congestive heart failure, NYHA class 3, chronic, systolic ([place]) 54/01/8118   Atrial fibrillation, permanent ([place]) 07/25/2017   Memory change    Thrombocytopenia ([place])    Right temporal lobe infarction ([place]) 06/13/2017   Acute kidney injury superimposed on chronic kidney disease ([place]) 06/13/2017   Seizures ([place]) 06/12/2017   Sebaceous cyst of skin of breast, left 04/25/2017   Chronic kidney disease (CKD), stage IV (severe) ([place]) 01/22/2017   Secondary hyperparathyroidism of renal origin ([place]) 01/22/2017   Chronic anticoagulation 03/06/2016   Chronic, continuous use of opioids 10/18/2014   OSA on CPAP 04/04/2014   History of tobacco abuse 06/28/2013   DIABETIC  RETINOPATHY 02/24/2008   Insulin dependent type 2 diabetes mellitus ([place]) 07/24/2006   HYPERCHOLESTEROLEMIA 07/24/2006   Depression 07/24/2006   HYPERTENSION, BENIGN SYSTEMIC 07/24/2006   Coronary atherosclerosis 07/24/2006   GASTROESOPHAGEAL REFLUX, NO ESOPHAGITIS 07/24/2006    Ward Chatters, PT 04/12/2021, 4:15 PM  [facility] [address] [place], [place], [address] Phone: [phone]   Fax:  [phone]  Name: Anthony [name] MRN: [id number] Date of Birth: [birth year]

## 2021-04-13 ENCOUNTER — Telehealth: Payer: Self-pay

## 2021-04-13 NOTE — Telephone Encounter (Signed)
Patient called in stating that he was feeling more SOB than ususal, had patient send in a manual transmission noted optivol increased since 02/26/21, optivol was elevated during OV with SK on 03/26/21 per SK notes "Volume status stable, esp given renal function holding diuretic" of note LV threshold 3.50V@1 .57ms LV capture management set 3.50V@1 .82ms~ monitor only, patient agreeable to appointment with DC on 04/16/21 at 10:40AM to check LV threshold.

## 2021-04-16 ENCOUNTER — Ambulatory Visit (INDEPENDENT_AMBULATORY_CARE_PROVIDER_SITE_OTHER): Payer: HMO | Admitting: Family

## 2021-04-16 ENCOUNTER — Other Ambulatory Visit: Payer: Self-pay

## 2021-04-16 ENCOUNTER — Ambulatory Visit (INDEPENDENT_AMBULATORY_CARE_PROVIDER_SITE_OTHER): Payer: HMO

## 2021-04-16 ENCOUNTER — Encounter (HOSPITAL_BASED_OUTPATIENT_CLINIC_OR_DEPARTMENT_OTHER): Payer: Self-pay | Admitting: Family

## 2021-04-16 VITALS — BP 160/100 | HR 71 | Ht 72.0 in | Wt 232.2 lb

## 2021-04-16 DIAGNOSIS — I5032 Chronic diastolic (congestive) heart failure: Secondary | ICD-10-CM

## 2021-04-16 DIAGNOSIS — I428 Other cardiomyopathies: Secondary | ICD-10-CM | POA: Diagnosis not present

## 2021-04-16 DIAGNOSIS — N184 Chronic kidney disease, stage 4 (severe): Secondary | ICD-10-CM

## 2021-04-16 DIAGNOSIS — I10 Essential (primary) hypertension: Secondary | ICD-10-CM | POA: Diagnosis not present

## 2021-04-16 DIAGNOSIS — Z95810 Presence of automatic (implantable) cardiac defibrillator: Secondary | ICD-10-CM

## 2021-04-16 LAB — CUP PACEART INCLINIC DEVICE CHECK
Battery Remaining Longevity: 13 mo
Battery Voltage: 2.88 V
Brady Statistic AP VP Percent: 0 %
Brady Statistic AP VS Percent: 0 %
Brady Statistic AS VP Percent: 0 %
Brady Statistic AS VS Percent: 0 %
Brady Statistic RA Percent Paced: 0 %
Brady Statistic RV Percent Paced: 98.95 %
Date Time Interrogation Session: 20221121112512
HighPow Impedance: 59 Ohm
Implantable Lead Implant Date: 20190301
Implantable Lead Implant Date: 20190301
Implantable Lead Location: 753858
Implantable Lead Location: 753860
Implantable Pulse Generator Implant Date: 20190301
Lead Channel Impedance Value: 250.943
Lead Channel Impedance Value: 250.943
Lead Channel Impedance Value: 255.093
Lead Channel Impedance Value: 266 Ohm
Lead Channel Impedance Value: 270.667
Lead Channel Impedance Value: 399 Ohm
Lead Channel Impedance Value: 4047 Ohm
Lead Channel Impedance Value: 475 Ohm
Lead Channel Impedance Value: 475 Ohm
Lead Channel Impedance Value: 532 Ohm
Lead Channel Impedance Value: 532 Ohm
Lead Channel Impedance Value: 551 Ohm
Lead Channel Impedance Value: 874 Ohm
Lead Channel Impedance Value: 893 Ohm
Lead Channel Impedance Value: 931 Ohm
Lead Channel Impedance Value: 931 Ohm
Lead Channel Impedance Value: 950 Ohm
Lead Channel Impedance Value: 988 Ohm
Lead Channel Pacing Threshold Amplitude: 0.625 V
Lead Channel Pacing Threshold Amplitude: 2.75 V
Lead Channel Pacing Threshold Pulse Width: 0.4 ms
Lead Channel Pacing Threshold Pulse Width: 1.5 ms
Lead Channel Sensing Intrinsic Amplitude: 3.875 mV
Lead Channel Sensing Intrinsic Amplitude: 3.875 mV
Lead Channel Setting Pacing Amplitude: 2.5 V
Lead Channel Setting Pacing Amplitude: 3.5 V
Lead Channel Setting Pacing Pulse Width: 0.4 ms
Lead Channel Setting Pacing Pulse Width: 1.5 ms
Lead Channel Setting Sensing Sensitivity: 0.3 mV

## 2021-04-16 MED ORDER — FUROSEMIDE 40 MG PO TABS: 40.0000 mg | ORAL_TABLET | Freq: Every day | ORAL | 1 refills | Status: DC

## 2021-04-16 NOTE — Progress Notes (Signed)
ICD check in clinic, to evaluate LV threshold due to automatic LV capture management on 04/13/21 3.50V@1 .20ms LV ouput set at 3.50V@1 .69ms and patient called in on 04/13/21 stating that he was more SOB than usual. LV threshold in office 2.75V@1 .25ms, output left at 3.50V@1 .54ms ( .75V safety margin). Optivol elevated and ongoing since 03/06/21, patient stated he feels more SOB than when he saw Dr Caryl Comes on 03/26/21, patient agreeable to appointment with Laurann Montana today at 2:45pm at [facility] office.

## 2021-04-16 NOTE — Patient Instructions (Addendum)
Medication Instructions:  Your physician has recommended you make the following change in your medication:  START Furosemide (Lasix) one 40mg  tablet daily  *If you need a refill on your cardiac medications before your next appointment, please call your pharmacy*   Lab Work: Your physician recommends that you return for lab work today: CBC, BNP, BMP  Your physician recommends that you return for lab work in 1 weeks for BMP  If you have labs (blood work) drawn today and your tests are completely normal, you will receive your results only by: [place] (if you have [place]) OR A paper copy in the mail If you have any lab test that is abnormal or we need to change your treatment, we will call you to review the results.   Testing/Procedures: Your EKG today showed your pacemaker was functioning well.   Follow-Up: At [facility], you and your health needs are our priority.  As part of our continuing mission to provide you with exceptional heart care, we have created designated Provider Care Teams.  These Care Teams include your primary Cardiologist (physician) and Advanced Practice Providers (APPs -  Physician Assistants and Nurse Practitioners) who all work together to provide you with the care you need, when you need it.  We recommend signing up for the patient portal called "MyChart".  Sign up information is provided on this After Visit Summary.  MyChart is used to connect with patients for Virtual Visits (Telemedicine).  Patients are able to view lab/test results, encounter notes, upcoming appointments, etc.  Non-urgent messages can be sent to your provider as well.   To learn more about what you can do with MyChart, go to [email or web address].    Your next appointment:   1 month(s)  The format for your next appointment:   In Person  Provider:   Freada Bergeron, MD or Advanced Practice Provider    Other Instructions   Recommend drinking less than 2 liters (64 oz)  of fluid per day  Heart Healthy Diet Recommendations: A low-salt diet is recommended. Meats should be grilled, baked, or boiled. Avoid fried foods. Focus on lean protein sources like fish or chicken with vegetables and fruits. The American Heart Association is a Microbiologist!  American Heart Association Diet and Lifeystyle Recommendations    Exercise recommendations: The American Heart Association recommends 150 minutes of moderate intensity exercise weekly. Try 30 minutes of moderate intensity exercise 4-5 times per week. This could include walking, jogging, or swimming.

## 2021-04-16 NOTE — Progress Notes (Signed)
Office Visit    Patient Name: Anthony [name] Date of Encounter: 04/16/2021  PCP:  Zenia Resides, MD   [place]  Cardiologist:  Freada Bergeron, MD  Advanced Practice Provider:  No care team member to display Electrophysiologist:  Virl Axe, MD      Chief Complaint    Anthony [name] is a 70 y.o. male with a hx of atrial fibrillation, AV ablation 07/2017 with PM/ICD placement, CAD s/p PCI in 2013 of circumflex lesion extending into second obtuse marginal branch with DES, hypertension, CKD 4, chronic systolic and diastolic heart failure, OSA presents today for dyspnea on exertion   Past Medical History    Past Medical History:  Diagnosis Date   Arthritis    Benign neoplasm of descending colon    Benign neoplasm of sigmoid colon    Benign neoplasm of transverse colon    CAD in native artery    a. reported MI 2000, 2001, s/p stenting of the circumflex lesion extending into the second obtuse marginal branch with a drug-eluting stent in 2003. b. low risk nuc 2015.   Chronic atrial fibrillation (HCC)    Chronic combined systolic and diastolic CHF (congestive heart failure) ([place])    a. Previously diastolic, then EF 17-51% in 10/2016.   CKD (chronic kidney disease), stage III (HCC)    Depression    Diabetes mellitus    Edema of extremities    Erectile dysfunction    GERD (gastroesophageal reflux disease)    Hyperlipidemia    Hypertension    Myocardial infarction ([place]) 2000&2001   Obesity    Onychomycosis    OSA (obstructive sleep apnea)    S/P ICD (internal cardiac defibrillator) procedure and BiV device,  07/25/17 Medtronic  07/26/2017   Seizures ([place])    Sleep apnea    Stroke ([place])    Tubular adenoma of colon 10/2002   Past Surgical History:  Procedure Laterality Date   ANGIOPLASTY  2001   stent x 1   AV NODE ABLATION N/A 08/20/2017   Procedure: AV NODE ABLATION;  Surgeon: Deboraha Sprang, MD;  Location: [facility];  Service:  Cardiovascular;  Laterality: N/A;   BIV ICD INSERTION CRT-D N/A 07/25/2017   Procedure: BIV ICD INSERTION CRT-D;  Surgeon: Deboraha Sprang, MD;  Location: [facility];  Service: Cardiovascular;  Laterality: N/A;   COLONOSCOPY  2000?   negative   COLONOSCOPY WITH PROPOFOL N/A 02/08/2019   Procedure: COLONOSCOPY WITH PROPOFOL;  Surgeon: Ladene Artist, MD;  Location: [facility];  Service: Endoscopy;  Laterality: N/A;   FOOT ARTHROTOMY Right    POLYPECTOMY  02/08/2019   Procedure: POLYPECTOMY;  Surgeon: Ladene Artist, MD;  Location: [facility];  Service: Endoscopy;;   TOE AMPUTATION Left 2012    Allergies  Allergies  Allergen Reactions   Enalapril Maleate Cough    History of Present Illness    Anthony [name] is a 71 y.o. male with a hx of atrial fibrillation, AV ablation 07/2017 with PM/ICD placement, CAD s/p PCI in 2013 of circumflex lesion extending into second obtuse marginal branch with DES, hypertension, CKD 4, chronic systolic and diastolic heart failure, OSA last seen 03/26/21 by Dr. Caryl Comes.  Previous cardiac catheterization in 2013 with DES to circumflex extending into second obtuse marginal branch.  Myoview 2007 with no ischemia.  Echocardiogram 2018 LVEF 25 to 30% improved in 2019 to 55-60%.  Previous testing for amyloid with negative  PYP.  Seen by Dr. Meda Coffee 06/2020 with no edema, orthopnea, PND.  No changes were made at that time.  Hospitalized 01/2021 with leg weakness which normalized and neurology was consulted.    He saw Dr. Caryl Comes 03/26/2021 noted marked dyspnea on exertion.  He was compliant with CPAP.  His same regimen was continued.  He had ICD in clinic check earlier this morning by RN staff.  OptiVol level elevated and ongoing since 03/06/2021.  He noted increasing shortness of breath.  He was set up for same-day appointment. Notes dyspnea on exertion but not at rest. Dyspneic even when walking from room to room in his home. This has been worsening over the  last 2 weeks. Notes no orthopnea nor PND. Notes some lower extremity edema which is mild. Sits at home with his feet down. He drinks water and soda (Diet Pepsi) throughout the day - drinks about 3 bottles of water per day and 3 or 4 cans of soda. No recent palpitations. He eats two meals per day.   EKGs/Labs/Other Studies Reviewed:   The following studies were reviewed today:  Carotid duplex 01/2021 Right Carotid: The extracranial vessels were near-normal with only minimal  wall                thickening or plaque.   Left Carotid: The extracranial vessels were near-normal with only minimal  wall               thickening or plaque.   Vertebrals:  Bilateral vertebral arteries demonstrate antegrade flow.  Subclavians: Normal flow hemodynamics were seen in bilateral subclavian               arteries.   Echo 01/2021  1. Left ventricular ejection fraction, by estimation, is 60 to 65%. The  left ventricle has normal function. The left ventricle has no regional  wall motion abnormalities. There is mild left ventricular hypertrophy.  Left ventricular diastolic parameters  are indeterminate.   2. Pacing wires in RA/RV. Right ventricular systolic function is normal.  The right ventricular size is normal.   3. Left atrial size was moderately dilated.   4. The mitral valve is abnormal. Trivial mitral valve regurgitation. No  evidence of mitral stenosis.   5. The aortic valve was not well visualized. There is mild calcification  of the aortic valve. Aortic valve regurgitation is not visualized. Mild  aortic valve sclerosis is present, with no evidence of aortic valve  stenosis.   6. The inferior vena cava is normal in size with greater than 50%  respiratory variability, suggesting right atrial pressure of 3 mmHg.   EKG:  EKG is  ordered today.  The ekg ordered today demonstrates ventricular paced rhythm 71 bpm.   Recent Labs: 02/16/2021: ALT 21 02/17/2021: TSH 0.935 02/19/2021: Hemoglobin  13.2; Platelets 105 02/27/2021: BUN 37; Creatinine, Ser 2.89; Potassium 4.2; Sodium 139  Recent Lipid Panel    Component Value Date/Time   CHOL 139 11/16/2020 0954   TRIG 92 11/16/2020 0954   HDL 35 (L) 11/16/2020 0954   CHOLHDL 4.0 11/16/2020 0954   CHOLHDL 3.2 02/28/2016 0906   VLDL 19 02/28/2016 0906   LDLCALC 86 11/16/2020 0954   LDLDIRECT 58 11/06/2011 1434    Risk Assessment/Calculations:   CHA2DS2-VASc Score = 5  This indicates a 7.2% annual risk of stroke. The patient's score is based upon: CHF History: 1 HTN History: 1 Diabetes History: 1 Stroke History: 0 Vascular Disease History: 1 Age Score: 1 Gender  Score: 0   Home Medications   Current Meds  Medication Sig   acetaminophen (TYLENOL) 650 MG CR tablet Take 1,300 mg by mouth 2 (two) times daily as needed for pain.   apixaban (ELIQUIS) 5 MG TABS tablet Take 1 tablet (5 mg total) by mouth 2 (two) times daily.   calcitRIOL (ROCALTROL) 0.5 MCG capsule Take 0.5 mcg by mouth daily at 12 noon.    carvedilol (COREG) 25 MG tablet Take 1 tablet (25 mg total) by mouth 2 (two) times daily. Please make appt with Dr. Johney Frame (Cardiologist) before anymore refills. Thank you 1st attempt   Continuous Blood Gluc Receiver (FREESTYLE LIBRE 14 DAY READER) DEVI Apply topically as directed.   Continuous Blood Gluc Sensor (FREESTYLE LIBRE 14 DAY SENSOR) MISC 1 Device by Does not apply route every 14 (fourteen) days.   Dulaglutide (TRULICITY) 1.5 IR/6.7EL SOPN Inject 1.5 mg into the skin once a week.   furosemide (LASIX) 40 MG tablet Take 1 tablet (40 mg total) by mouth daily.   gabapentin (NEURONTIN) 300 MG capsule Take 1 capsule (300 mg total) by mouth at bedtime.   glucose blood (ONETOUCH VERIO) test strip Use as instructed   hydrALAZINE (APRESOLINE) 50 MG tablet Take 50 mg by mouth 3 (three) times daily.   insulin aspart (NOVOLOG FLEXPEN) 100 UNIT/ML FlexPen Inject 10-15 Units into the skin 2 (two) times daily before lunch and  supper. Or largest meal of the day.   insulin degludec (TRESIBA FLEXTOUCH) 200 UNIT/ML FlexTouch Pen Inject 30 Units into the skin daily.   Insulin Pen Needle (PEN NEEDLES) 32G X 4 MM MISC 1 Device by Does not apply route as directed.   Lancets (ONETOUCH ULTRASOFT) lancets Use as instructed   loratadine (CLARITIN) 10 MG tablet Take 10 mg by mouth daily as needed for allergies.   losartan (COZAAR) 25 MG tablet Take 1 tablet (25 mg total) by mouth daily.   Multiple Vitamin (MULTIVITAMIN) tablet Take 1 tablet by mouth daily.     nitroGLYCERIN (NITROSTAT) 0.4 MG SL tablet Place 1 tablet (0.4 mg total) under the tongue every 5 (five) minutes as needed for chest pain. If no relief by 3rd tab, call 911   omeprazole (PRILOSEC) 40 MG capsule TAKE ONE CAPSULE BY MOUTH DAILY BEFORE SUPPER (Patient taking differently: Take 40 mg by mouth daily.)   oxymorphone (OPANA) 5 MG tablet Take 10-15 mg by mouth daily. Per Dr Brien Few   PARoxetine (PAXIL) 30 MG tablet TAKE 1 TABLET BY MOUTH EVERY DAY (Patient taking differently: Take 30 mg by mouth daily.)   rosuvastatin (CRESTOR) 20 MG tablet TAKE 1 TABLET BY MOUTH EVERY DAY (Patient taking differently: Take 20 mg by mouth daily.)   tamsulosin (FLOMAX) 0.4 MG CAPS capsule Take 0.4 mg by mouth.     Review of Systems      All other systems reviewed and are otherwise negative except as noted above.  Physical Exam    VS:  BP (!) 160/100   Pulse 71   Ht 6' (1.829 m)   Wt 232 lb 3.2 oz (105.3 kg)   SpO2 99%   BMI 31.49 kg/m  , BMI Body mass index is 31.49 kg/m.  Wt Readings from Last 3 Encounters:  04/16/21 232 lb 3.2 oz (105.3 kg)  04/09/21 235 lb (106.6 kg)  03/26/21 233 lb 9.6 oz (106 kg)     GEN: Well nourished, well developed, in no acute distress. HEENT: normal. Neck: Supple, no JVD, carotid bruits, or masses.  Cardiac: RRR, no murmurs, rubs, or gallops. No clubbing, cyanosis, edema.  Radials/PT 2+ and equal bilaterally.  Respiratory:  Respirations  regular and unlabored, clear to auscultation bilaterally. GI: Soft, nontender, nondistended. MS: No deformity or atrophy. Skin: Warm and dry, no rash. Neuro:  Strength and sensation are intact. Psych: Normal affect.  Assessment & Plan    CAD - No chest pain. Dyspnea due to volume overload, management below. EKG today no acute changes. GDMT includes carvedilol, coreg. No aspirin due to chronic anticoagulation. Heart healthy diet and regular cardiovascular exercise encouraged.    HFpEF - Volume overloaded per Optivol report. Endorses drinking >2L fluid per day and encouraged to reduce. Start Lasix 40mg  QD. BMP, BNP, CBC today. Repeat BMP in 1 week. Heart failure education provided.  S/p PPM/ICD - Follows with Dr. Caryl Comes.   Atrial fibrillation / Chronic anticoagulation - Rate controlled today. PPM functioning appropriately. Continue Eliquis 5mg  BID. Does not meet dose reduction criteria. CHA2DS2-VASc Score = 5 [CHF History: 1, HTN History: 1, Diabetes History: 1, Stroke History: 0, Vascular Disease History: 1, Age Score: 1, Gender Score: 0].  Therefore, the patient's annual risk of stroke is 7.2 %.     DM2 - Continue to follow with PCP.  HTN - BP elevated in setting of volume overload. Losartan recently resumed by primary care. Careful monitoring of renal function. Encouraged to check BP at home and bring log to next  clinic visit.   CKD - Follows with Dr. Posey Pronto of nephrology. Careful titration of diuretic and antihypertensive.  BMP today.   Disposition: Follow up in 1 month(s) with Freada Bergeron, MD or APP.  Signed, Loel Dubonnet, NP 04/16/2021, 9:25 PM [facility]

## 2021-04-17 LAB — CBC
Hematocrit: 40 % (ref 37.5–51.0)
Hemoglobin: 13.7 g/dL (ref 13.0–17.7)
MCH: 29.2 pg (ref 26.6–33.0)
MCHC: 34.3 g/dL (ref 31.5–35.7)
MCV: 85 fL (ref 79–97)
Platelets: 118 10*3/uL — ABNORMAL LOW (ref 150–450)
RBC: 4.69 x10E6/uL (ref 4.14–5.80)
RDW: 12.9 % (ref 11.6–15.4)
WBC: 8.4 10*3/uL (ref 3.4–10.8)

## 2021-04-17 LAB — BASIC METABOLIC PANEL
BUN/Creatinine Ratio: 7 — ABNORMAL LOW (ref 10–24)
BUN: 15 mg/dL (ref 8–27)
CO2: 25 mmol/L (ref 20–29)
Calcium: 9.6 mg/dL (ref 8.6–10.2)
Chloride: 104 mmol/L (ref 96–106)
Creatinine, Ser: 2.27 mg/dL — ABNORMAL HIGH (ref 0.76–1.27)
Glucose: 151 mg/dL — ABNORMAL HIGH (ref 70–99)
Potassium: 3.3 mmol/L — ABNORMAL LOW (ref 3.5–5.2)
Sodium: 143 mmol/L (ref 134–144)
eGFR: 30 mL/min/{1.73_m2} — ABNORMAL LOW (ref >59)

## 2021-04-17 LAB — BRAIN NATRIURETIC PEPTIDE: BNP: 297.1 pg/mL — ABNORMAL HIGH (ref 0.0–100.0)

## 2021-04-18 MED ORDER — POTASSIUM CHLORIDE CRYS ER 20 MEQ PO TBCR: 20.0000 meq | EXTENDED_RELEASE_TABLET | Freq: Every day | ORAL | 3 refills | Status: DC

## 2021-04-18 NOTE — Progress Notes (Signed)
Called pt. To review lab work and order new medications and labs. Pt. Endorses understanding of all results and new orders.   "Stable kidney function. Potassium mildly low. CBC with no evidence of anemia nor infection. BNP with mildly elevated fluid volume. Continue with Lasix 40mg  QD as discussed in clinic. Recommend Potassium 86mEq daily x 3 days then potassium 20 mEq daily. Repeat BMP 04/23/21. "  New prescription called in and lab slips will be printed and left upstairs in [facility] office

## 2021-04-18 NOTE — Addendum Note (Signed)
Addended by: Gerald Stabs on: 04/18/2021 01:44 PM   Modules accepted: Orders

## 2021-04-23 ENCOUNTER — Telehealth: Payer: Self-pay

## 2021-04-23 DIAGNOSIS — Z95810 Presence of automatic (implantable) cardiac defibrillator: Secondary | ICD-10-CM | POA: Diagnosis not present

## 2021-04-23 DIAGNOSIS — I5032 Chronic diastolic (congestive) heart failure: Secondary | ICD-10-CM | POA: Diagnosis not present

## 2021-04-23 DIAGNOSIS — I428 Other cardiomyopathies: Secondary | ICD-10-CM | POA: Diagnosis not present

## 2021-04-23 NOTE — Telephone Encounter (Signed)
Opened in error

## 2021-04-24 ENCOUNTER — Telehealth: Payer: Self-pay

## 2021-04-24 LAB — BASIC METABOLIC PANEL
BUN/Creatinine Ratio: 8 — ABNORMAL LOW (ref 10–24)
BUN: 20 mg/dL (ref 8–27)
CO2: 25 mmol/L (ref 20–29)
Calcium: 10.1 mg/dL (ref 8.6–10.2)
Chloride: 100 mmol/L (ref 96–106)
Creatinine, Ser: 2.62 mg/dL — ABNORMAL HIGH (ref 0.76–1.27)
Glucose: 135 mg/dL — ABNORMAL HIGH (ref 70–99)
Potassium: 4 mmol/L (ref 3.5–5.2)
Sodium: 141 mmol/L (ref 134–144)
eGFR: 26 mL/min/{1.73_m2} — ABNORMAL LOW (ref >59)

## 2021-04-24 NOTE — Telephone Encounter (Signed)
Spoke with patient and gave him his lab results and recommendations for lasix and potassium. He voiced understanding.

## 2021-04-24 NOTE — Telephone Encounter (Signed)
-----   Message from Loel Dubonnet, NP sent at 04/24/2021 11:18 AM EST ----- Potassium improved to normal range. Kidney function overall stable. Continue Lasix 40mg  QD and Potassium 20 mEq daily. Follow up as scheduled.

## 2021-04-26 ENCOUNTER — Ambulatory Visit: Payer: HMO | Attending: Family Medicine

## 2021-04-26 ENCOUNTER — Other Ambulatory Visit: Payer: Self-pay

## 2021-04-26 DIAGNOSIS — M6281 Muscle weakness (generalized): Secondary | ICD-10-CM

## 2021-04-26 DIAGNOSIS — Z7409 Other reduced mobility: Secondary | ICD-10-CM | POA: Diagnosis not present

## 2021-04-26 NOTE — Therapy (Signed)
[place], [place], [address] Phone: [phone]   Fax:  [phone]  Physical Therapy Treatment  Patient Details  Name: Anthony [name] MRN: [id number] Date of Birth: 1950/09/26 Referring Provider (PT): Zenia Resides, MD   Encounter Date: 04/26/2021   PT End of Session - 04/26/21 1215     Visit Number 10    Number of Visits 17    Date for PT Re-Evaluation 06/07/21    Authorization Type [employer]    Progress Note Due on Visit 19   performed at visit 9   PT Start Time 1216    PT Stop Time 1256    PT Time Calculation (min) 40 min    Activity Tolerance Patient tolerated treatment well    Behavior During Therapy [facility] for tasks assessed/performed             Past Medical History:  Diagnosis Date   Arthritis    Benign neoplasm of descending colon    Benign neoplasm of sigmoid colon    Benign neoplasm of transverse colon    CAD in native artery    a. reported MI 2000, 2001, s/p stenting of the circumflex lesion extending into the second obtuse marginal branch with a drug-eluting stent in 2003. b. low risk nuc 2015.   Chronic atrial fibrillation (HCC)    Chronic combined systolic and diastolic CHF (congestive heart failure) ([place])    a. Previously diastolic, then EF 50-35% in 10/2016.   CKD (chronic kidney disease), stage III (HCC)    Depression    Diabetes mellitus    Edema of extremities    Erectile dysfunction    GERD (gastroesophageal reflux disease)    Hyperlipidemia    Hypertension    Myocardial infarction ([place]) 2000&2001   Obesity    Onychomycosis    OSA (obstructive sleep apnea)    S/P ICD (internal cardiac defibrillator) procedure and BiV device,  07/25/17 Medtronic  07/26/2017   Seizures ([place])    Sleep apnea    Stroke ([place])    Tubular adenoma of colon 10/2002    Past Surgical History:  Procedure Laterality Date   ANGIOPLASTY  2001   stent x 1   AV NODE ABLATION N/A  08/20/2017   Procedure: AV NODE ABLATION;  Surgeon: Deboraha Sprang, MD;  Location: [facility];  Service: Cardiovascular;  Laterality: N/A;   BIV ICD INSERTION CRT-D N/A 07/25/2017   Procedure: BIV ICD INSERTION CRT-D;  Surgeon: Deboraha Sprang, MD;  Location: [facility];  Service: Cardiovascular;  Laterality: N/A;   COLONOSCOPY  2000?   negative   COLONOSCOPY WITH PROPOFOL N/A 02/08/2019   Procedure: COLONOSCOPY WITH PROPOFOL;  Surgeon: Ladene Artist, MD;  [facility];  Service: Endoscopy;  Laterality: N/A;   FOOT ARTHROTOMY Right    POLYPECTOMY  02/08/2019   Procedure: POLYPECTOMY;  Surgeon: Ladene Artist, MD;  [facility];  Service: Endoscopy;;   TOE AMPUTATION Left 2012    There were no vitals filed for this visit.   Subjective Assessment - 04/26/21 1216     Subjective Pt presents to PT noting low back and L hip stiffness, but denies pain. Has continued to be compliant with his updated HEP with no adverse effect. Is ready to begin PT treatment at this time.    Currently in Pain? No/denies    Pain Score 0-No pain  [place] Adult PT Treatment/Exercise:   Therapeutic Exercise:  NuStep lvl 6 x 4 min UE/LE while taking subjective Leg press 2x10 65lb Single leg press 2x10 35lb L Cybex hip abd/ext 2x10 ea 17.5lbs STS 2x10 15lb KB LAQ 2x10 5lb Seated hamstring curl 2x10 black tband Step up 2x10 fwd 8in ea Fwd ball rollouts 2x10 - 3"  Manual Therapy: N/A   Neuromuscular re-ed: N/A   Therapeutic Activity: N/A   Modalities: N/A   Self Care: N/A   Consider / progression for next session:                                PT Short Term Goals - 04/12/21 1524       PT SHORT TERM GOAL #1   Title Pt will be compliant and knowledgeable with initial HEP for improved comfort and carryover    Baseline initial HEP given    Time 3    Period Weeks    Status Achieved    Target Date 04/02/21                PT Long Term Goals - 04/12/21 1457       PT LONG TERM GOAL #1   Title Pt will improve FOTO score to no less than 59% as proxy for functional improvement    Baseline 54% function    Time 8    Period Weeks    Status On-going    Target Date 06/07/21      PT LONG TERM GOAL #2   Title Pt will increase reps in 30 Sec STS to no less than 10 reps for improved balance and mobility    Baseline 7 reps; 9 reps on 04/12/21    Time 8    Period Weeks    Status On-going    Target Date 06/07/21      PT LONG TERM GOAL #3   Title Pt will decrease TUG to no greater than 12 seconds for improved balance and mobility    Baseline 17 sec, increased instability when turning L - update on 11/17 - 12 sec    Time 8    Period Weeks    Status Achieved      PT LONG TERM GOAL #4   Title Pt will increase L hip flex/abd to no less than 5/5 for improved functional mobility    Baseline see flowsheet    Time 8    Period Weeks    Status On-going    Target Date 06/07/21                   Plan - 04/26/21 1256     Clinical Impression Statement Pt was able to complete all prescribed exercises with no adverse effect or increase in pain. Therapy today continued to focus on improving LE strength in order to improve functional mobility, with continued progress of resistance and standing activity. Pt continues to progress well with therapy, showing improving strength and activity tolerance. PT will continue to progress exercises as tolerated per POC as prescribed.    PT Treatment/Interventions ADLs/Self Care Home Management;Cryotherapy;Moist Heat;Gait training;Stair training;Functional mobility training;Therapeutic activities;Therapeutic exercise;Balance training;Neuromuscular re-education;Patient/family education;Manual techniques;Taping    PT Next Visit Plan standing balance exercises; progress LE strength, gait, and balance as able    PT Home Exercise Plan Access Code: [email or web address]              Patient will benefit from  skilled therapeutic intervention in order to improve the following deficits and impairments:  Abnormal gait, Decreased activity tolerance, Decreased balance, Decreased endurance, Decreased mobility, Decreased range of motion, Decreased strength, Impaired sensation, Pain  Visit Diagnosis: Impaired functional mobility, balance, gait, and endurance  Muscle weakness (generalized)     Problem List Patient Active Problem List   Diagnosis Date Noted   Hip weakness 02/27/2021   Healthcare maintenance 02/27/2021   Left leg weakness 02/17/2021   COVID-19 vaccine administered 12/27/2020   Neck mass 11/16/2020   Erectile dysfunction 05/31/2020   Polypharmacy 05/22/2020   Porokeratosis 04/26/2020   Sprain of foot 01/25/2020   Neck muscle strain 11/22/2019   Patient cannot afford medications 10/18/2019   Hx of adenomatous colonic polyps    S/P ICD (internal cardiac defibrillator) procedure and BiV device,  07/25/17 Medtronic  07/26/2017   NICM (nonischemic cardiomyopathy) ([place]) 07/25/2017   Congestive heart failure, NYHA class 3, chronic, systolic ([place]) 35/00/9381   Atrial fibrillation, permanent ([place]) 07/25/2017   Memory change    Thrombocytopenia ([place])    Right temporal lobe infarction ([place]) 06/13/2017   Acute kidney injury superimposed on chronic kidney disease ([place]) 06/13/2017   Seizures ([place]) 06/12/2017   Sebaceous cyst of skin of breast, left 04/25/2017   Chronic kidney disease (CKD), stage IV (severe) ([place]) 01/22/2017   Secondary hyperparathyroidism of renal origin ([place]) 01/22/2017   Chronic anticoagulation 03/06/2016   Chronic, continuous use of opioids 10/18/2014   OSA on CPAP 04/04/2014   History of tobacco abuse 06/28/2013   DIABETIC  RETINOPATHY 02/24/2008   Insulin dependent type 2 diabetes mellitus ([place]) 07/24/2006   HYPERCHOLESTEROLEMIA 07/24/2006   Depression 07/24/2006   HYPERTENSION, BENIGN SYSTEMIC 07/24/2006   Coronary atherosclerosis  07/24/2006   GASTROESOPHAGEAL REFLUX, NO ESOPHAGITIS 07/24/2006    Ward Chatters, PT 04/26/2021, 12:58 PM  [facility] [address] [place], [place], [address] Phone: [phone]   Fax:  [phone]  Name: Anthony [name] MRN: [id number] Date of Birth: [birth year]

## 2021-05-01 ENCOUNTER — Ambulatory Visit: Payer: HMO

## 2021-05-01 ENCOUNTER — Other Ambulatory Visit: Payer: Self-pay

## 2021-05-01 DIAGNOSIS — Z7409 Other reduced mobility: Secondary | ICD-10-CM

## 2021-05-01 DIAGNOSIS — M6281 Muscle weakness (generalized): Secondary | ICD-10-CM

## 2021-05-01 NOTE — Therapy (Signed)
[place], [place], [address] Phone: [phone]   Fax:  [phone]  Physical Therapy Treatment  Patient Details  Name: Anthony [name] MRN: [id number] Date of Birth: 06/29/1950 Referring Provider (PT): Zenia Resides, MD   Encounter Date: 05/01/2021   PT End of Session - 05/01/21 1449     Visit Number 11    Number of Visits 17    Date for PT Re-Evaluation 06/07/21    Authorization Type [employer]    Progress Note Due on Visit 19   performed at visit 9   PT Start Time 1447    PT Stop Time 1525    PT Time Calculation (min) 38 min    Activity Tolerance Patient tolerated treatment well    Behavior During Therapy [facility] for tasks assessed/performed             Past Medical History:  Diagnosis Date   Arthritis    Benign neoplasm of descending colon    Benign neoplasm of sigmoid colon    Benign neoplasm of transverse colon    CAD in native artery    a. reported MI 2000, 2001, s/p stenting of the circumflex lesion extending into the second obtuse marginal branch with a drug-eluting stent in 2003. b. low risk nuc 2015.   Chronic atrial fibrillation (HCC)    Chronic combined systolic and diastolic CHF (congestive heart failure) ([place])    a. Previously diastolic, then EF 78-24% in 10/2016.   CKD (chronic kidney disease), stage III (HCC)    Depression    Diabetes mellitus    Edema of extremities    Erectile dysfunction    GERD (gastroesophageal reflux disease)    Hyperlipidemia    Hypertension    Myocardial infarction ([place]) 2000&2001   Obesity    Onychomycosis    OSA (obstructive sleep apnea)    S/P ICD (internal cardiac defibrillator) procedure and BiV device,  07/25/17 Medtronic  07/26/2017   Seizures ([place])    Sleep apnea    Stroke ([place])    Tubular adenoma of colon 10/2002    Past Surgical History:  Procedure Laterality Date   ANGIOPLASTY  2001   stent x 1   AV NODE ABLATION N/A  08/20/2017   Procedure: AV NODE ABLATION;  Surgeon: Deboraha Sprang, MD;  Location: [place] [facility];  Service: Cardiovascular;  Laterality: N/A;   BIV ICD INSERTION CRT-D N/A 07/25/2017   Procedure: BIV ICD INSERTION CRT-D;  Surgeon: Deboraha Sprang, MD;  Location: [place] [facility];  Service: Cardiovascular;  Laterality: N/A;   COLONOSCOPY  2000?   negative   COLONOSCOPY WITH PROPOFOL N/A 02/08/2019   Procedure: COLONOSCOPY WITH PROPOFOL;  Surgeon: Ladene Artist, MD;  [facility];  Service: Endoscopy;  Laterality: N/A;   FOOT ARTHROTOMY Right    POLYPECTOMY  02/08/2019   Procedure: POLYPECTOMY;  Surgeon: Ladene Artist, MD;  [facility];  Service: Endoscopy;;   TOE AMPUTATION Left 2012    There were no vitals filed for this visit.   Subjective Assessment - 05/01/21 1450     Subjective Pt presents to PT with reports of slight increase in L hip stiffness. He has been compliant with his HEP with no adverse effect. Pt is ready to begin PT treatment at this time.    Currently in Pain? Yes    Pain Score 7     Pain Location Hip    Pain Orientation  Left           [facility] Adult PT Treatment/Exercise:   Therapeutic Exercise:  NuStep lvl 6 x 4 min UE/LE while taking subjective Cybex hip abd 2x10 ea 17.5lbs Cybex hip ext 2x10 ea 30bs STS 3x10 15lb KB LAQ 3x10 6lb ea Seated hamstring curl 3x10 black tband Step up x10 fwd 8in ea Fwd ball rollouts x10 - 3"  Not Performed Today: Leg press 2x10 65lb Single leg press 2x10 35lb L   Manual Therapy: N/A   Neuromuscular re-ed: N/A   Therapeutic Activity: N/A   Modalities: N/A   Self Care: N/A   Consider / progression for next session:                                PT Short Term Goals - 04/12/21 1524       PT SHORT TERM GOAL #1   Title Pt will be compliant and knowledgeable with initial HEP for improved comfort and carryover    Baseline initial HEP given    Time 3     Period Weeks    Status Achieved    Target Date 04/02/21               PT Long Term Goals - 04/12/21 1457       PT LONG TERM GOAL #1   Title Pt will improve FOTO score to no less than 59% as proxy for functional improvement    Baseline 54% function    Time 8    Period Weeks    Status On-going    Target Date 06/07/21      PT LONG TERM GOAL #2   Title Pt will increase reps in 30 Sec STS to no less than 10 reps for improved balance and mobility    Baseline 7 reps; 9 reps on 04/12/21    Time 8    Period Weeks    Status On-going    Target Date 06/07/21      PT LONG TERM GOAL #3   Title Pt will decrease TUG to no greater than 12 seconds for improved balance and mobility    Baseline 17 sec, increased instability when turning L - update on 11/17 - 12 sec    Time 8    Period Weeks    Status Achieved      PT LONG TERM GOAL #4   Title Pt will increase L hip flex/abd to no less than 5/5 for improved functional mobility    Baseline see flowsheet    Time 8    Period Weeks    Status On-going    Target Date 06/07/21                   Plan - 05/01/21 1452     Clinical Impression Statement Pt was able to complete all prescribed exercises with no adverse effect or change in baseline. Therapy today continued to focus on continued improvement in LE strength, with particular emphasis on L LE secondary to documented impairments. He continues to benefit from skilled PT services and will continue to be seen and progressed as tolerated.    PT Treatment/Interventions ADLs/Self Care Home Management;Cryotherapy;Moist Heat;Gait training;Stair training;Functional mobility training;Therapeutic activities;Therapeutic exercise;Balance training;Neuromuscular re-education;Patient/family education;Manual techniques;Taping    PT Next Visit Plan standing balance exercises; progress LE strength, gait, and balance as able    PT Home Exercise Plan Access Code: [facility]  Patient  will benefit from skilled therapeutic intervention in order to improve the following deficits and impairments:  Abnormal gait, Decreased activity tolerance, Decreased balance, Decreased endurance, Decreased mobility, Decreased range of motion, Decreased strength, Impaired sensation, Pain  Visit Diagnosis: Impaired functional mobility, balance, gait, and endurance  Muscle weakness (generalized)     Problem List Patient Active Problem List   Diagnosis Date Noted   Hip weakness 02/27/2021   Healthcare maintenance 02/27/2021   Left leg weakness 02/17/2021   COVID-19 vaccine administered 12/27/2020   Neck mass 11/16/2020   Erectile dysfunction 05/31/2020   Polypharmacy 05/22/2020   Porokeratosis 04/26/2020   Sprain of foot 01/25/2020   Neck muscle strain 11/22/2019   Patient cannot afford medications 10/18/2019   Hx of adenomatous colonic polyps    S/P ICD (internal cardiac defibrillator) procedure and BiV device,  07/25/17 Medtronic  07/26/2017   NICM (nonischemic cardiomyopathy) ([place]) 07/25/2017   Congestive heart failure, NYHA class 3, chronic, systolic ([place]) 37/02/6268   Atrial fibrillation, permanent ([place]) 07/25/2017   Memory change    Thrombocytopenia ([place])    Right temporal lobe infarction ([place]) 06/13/2017   Acute kidney injury superimposed on chronic kidney disease ([place]) 06/13/2017   Seizures ([place]) 06/12/2017   Sebaceous cyst of skin of breast, left 04/25/2017   Chronic kidney disease (CKD), stage IV (severe) ([place]) 01/22/2017   Secondary hyperparathyroidism of renal origin ([place]) 01/22/2017   Chronic anticoagulation 03/06/2016   Chronic, continuous use of opioids 10/18/2014   OSA on CPAP 04/04/2014   History of tobacco abuse 06/28/2013   DIABETIC  RETINOPATHY 02/24/2008   Insulin dependent type 2 diabetes mellitus ([place]) 07/24/2006   HYPERCHOLESTEROLEMIA 07/24/2006   Depression 07/24/2006   HYPERTENSION, BENIGN SYSTEMIC 07/24/2006   Coronary atherosclerosis 07/24/2006    GASTROESOPHAGEAL REFLUX, NO ESOPHAGITIS 07/24/2006    Ward Chatters, PT 05/01/2021, 3:25 PM  [facility] [address] [place], [place], [address] Phone: [phone]   Fax:  [phone]  Name: Anthony [name] MRN: [id number] Date of Birth: 01/25/1951

## 2021-05-02 ENCOUNTER — Ambulatory Visit (INDEPENDENT_AMBULATORY_CARE_PROVIDER_SITE_OTHER): Payer: HMO | Admitting: Podiatry

## 2021-05-02 ENCOUNTER — Encounter: Payer: Self-pay | Admitting: Podiatry

## 2021-05-02 DIAGNOSIS — B351 Tinea unguium: Secondary | ICD-10-CM

## 2021-05-02 DIAGNOSIS — M79609 Pain in unspecified limb: Secondary | ICD-10-CM

## 2021-05-02 DIAGNOSIS — Z7901 Long term (current) use of anticoagulants: Secondary | ICD-10-CM

## 2021-05-02 DIAGNOSIS — M2010 Hallux valgus (acquired), unspecified foot: Secondary | ICD-10-CM

## 2021-05-02 DIAGNOSIS — N184 Chronic kidney disease, stage 4 (severe): Secondary | ICD-10-CM

## 2021-05-02 DIAGNOSIS — D689 Coagulation defect, unspecified: Secondary | ICD-10-CM

## 2021-05-02 DIAGNOSIS — N189 Chronic kidney disease, unspecified: Secondary | ICD-10-CM

## 2021-05-02 DIAGNOSIS — N179 Acute kidney failure, unspecified: Secondary | ICD-10-CM

## 2021-05-02 DIAGNOSIS — E119 Type 2 diabetes mellitus without complications: Secondary | ICD-10-CM | POA: Diagnosis not present

## 2021-05-02 NOTE — Progress Notes (Signed)
This patient returns to my office for at risk foot care.  This patient requires this care by a professional since this patient will be at risk due to having venous insufficiency, chronic kidney disease stage IV diabetes and coagulation defect.  Patient is taking eliquiss.  This patient is unable to cut nails himself since the patient cannot reach his nails.These nails are painful walking and wearing shoes.  This patient presents for at risk foot care today.  General Appearance  Alert, conversant and in no acute stress.  Vascular  Dorsalis pedis and posterior tibial  pulses are palpable  bilaterally.  Capillary return is within normal limits  bilaterally. Temperature is within normal limits  bilaterally.  Neurologic  Senn-Weinstein monofilament wire test within normal limits  bilaterally. Muscle power within normal limits bilaterally.  Nails Thick disfigured discolored nails with subungual debris  from hallux to fifth toes bilaterally. No evidence of bacterial infection or drainage bilaterally.  Orthopedic  No limitations of motion  feet .  No crepitus or effusions noted.  No bony pathology or digital deformities noted.  Amputation second toe left foot.  Skin  normotropic skin with no porokeratosis noted bilaterally.  No signs of infections or ulcers noted.  Porokeratosis sub 4 left and sub 5th right asymptomatic.  Onychomycosis  Pain in right toes  Pain in left toes  Consent was obtained for treatment procedures.   Mechanical debridement of nails 1-5  Right and 1,3-5 left foot. performed with a nail nipper.  Filed with dremel without incident.    Return office visit   9  weeks                   Told patient to return for periodic foot care and evaluation due to potential at risk complications.   Rosamaria Donn DPM 

## 2021-05-08 ENCOUNTER — Ambulatory Visit: Payer: HMO

## 2021-05-08 ENCOUNTER — Other Ambulatory Visit: Payer: Self-pay

## 2021-05-08 ENCOUNTER — Ambulatory Visit (INDEPENDENT_AMBULATORY_CARE_PROVIDER_SITE_OTHER): Payer: HMO

## 2021-05-08 DIAGNOSIS — M6281 Muscle weakness (generalized): Secondary | ICD-10-CM

## 2021-05-08 DIAGNOSIS — Z7409 Other reduced mobility: Secondary | ICD-10-CM

## 2021-05-08 DIAGNOSIS — I428 Other cardiomyopathies: Secondary | ICD-10-CM

## 2021-05-08 LAB — CUP PACEART REMOTE DEVICE CHECK
Battery Remaining Longevity: 12 mo
Battery Voltage: 2.87 V
Brady Statistic AP VP Percent: 0 %
Brady Statistic AP VS Percent: 0 %
Brady Statistic AS VP Percent: 0 %
Brady Statistic AS VS Percent: 0 %
Brady Statistic RA Percent Paced: 0 %
Brady Statistic RV Percent Paced: 99.03 %
Date Time Interrogation Session: 20221213012205
HighPow Impedance: 71 Ohm
Implantable Lead Implant Date: 20190301
Implantable Lead Implant Date: 20190301
Implantable Lead Location: 753858
Implantable Lead Location: 753860
Implantable Pulse Generator Implant Date: 20190301
Lead Channel Impedance Value: 1292 Ohm
Lead Channel Impedance Value: 1311 Ohm
Lead Channel Impedance Value: 1311 Ohm
Lead Channel Impedance Value: 1311 Ohm
Lead Channel Impedance Value: 1311 Ohm
Lead Channel Impedance Value: 1349 Ohm
Lead Channel Impedance Value: 356.187
Lead Channel Impedance Value: 365.195
Lead Channel Impedance Value: 370.256
Lead Channel Impedance Value: 377.863
Lead Channel Impedance Value: 383.284
Lead Channel Impedance Value: 4047 Ohm
Lead Channel Impedance Value: 418 Ohm
Lead Channel Impedance Value: 513 Ohm
Lead Channel Impedance Value: 703 Ohm
Lead Channel Impedance Value: 722 Ohm
Lead Channel Impedance Value: 760 Ohm
Lead Channel Impedance Value: 817 Ohm
Lead Channel Pacing Threshold Amplitude: 0.5 V
Lead Channel Pacing Threshold Amplitude: 3 V
Lead Channel Pacing Threshold Pulse Width: 0.4 ms
Lead Channel Pacing Threshold Pulse Width: 1.5 ms
Lead Channel Sensing Intrinsic Amplitude: 3.875 mV
Lead Channel Sensing Intrinsic Amplitude: 3.875 mV
Lead Channel Setting Pacing Amplitude: 2.5 V
Lead Channel Setting Pacing Amplitude: 3.5 V
Lead Channel Setting Pacing Pulse Width: 0.4 ms
Lead Channel Setting Pacing Pulse Width: 1.5 ms
Lead Channel Setting Sensing Sensitivity: 0.3 mV

## 2021-05-08 NOTE — Therapy (Signed)
[place], [place], [address] Phone: [phone]   Fax:  [phone]  Physical Therapy Treatment  Patient Details  Name: Anthony [name] MRN: [id number] Date of Birth: [birth year] Referring Provider (PT): Zenia Resides, MD   Encounter Date: 05/08/2021   PT End of Session - 05/08/21 1446     Visit Number 12    Number of Visits 17    Date for PT Re-Evaluation 06/07/21    Authorization Type [employer]    Progress Note Due on Visit 19   performed at visit 9   PT Start Time 1446    PT Stop Time 1525    PT Time Calculation (min) 39 min    Activity Tolerance Patient tolerated treatment well    Behavior During Therapy [facility] for tasks assessed/performed             Past Medical History:  Diagnosis Date   Arthritis    Benign neoplasm of descending colon    Benign neoplasm of sigmoid colon    Benign neoplasm of transverse colon    CAD in native artery    a. reported MI 2000, 2001, s/p stenting of the circumflex lesion extending into the second obtuse marginal branch with a drug-eluting stent in 2003. b. low risk nuc 2015.   Chronic atrial fibrillation (HCC)    Chronic combined systolic and diastolic CHF (congestive heart failure) ([place])    a. Previously diastolic, then EF 01-60% in 10/2016.   CKD (chronic kidney disease), stage III (HCC)    Depression    Diabetes mellitus    Edema of extremities    Erectile dysfunction    GERD (gastroesophageal reflux disease)    Hyperlipidemia    Hypertension    Myocardial infarction ([place]) 2000&2001   Obesity    Onychomycosis    OSA (obstructive sleep apnea)    S/P ICD (internal cardiac defibrillator) procedure and BiV device,  07/25/17 Medtronic  07/26/2017   Seizures ([place])    Sleep apnea    Stroke ([place])    Tubular adenoma of colon 10/2002    Past Surgical History:  Procedure Laterality Date   ANGIOPLASTY  2001   stent x 1   AV NODE ABLATION N/A  08/20/2017   Procedure: AV NODE ABLATION;  Surgeon: Deboraha Sprang, MD;  Location: [place] [facility];  Service: Cardiovascular;  Laterality: N/A;   BIV ICD INSERTION CRT-D N/A 07/25/2017   Procedure: BIV ICD INSERTION CRT-D;  Surgeon: Deboraha Sprang, MD;  Location: [place] [facility];  Service: Cardiovascular;  Laterality: N/A;   COLONOSCOPY  2000?   negative   COLONOSCOPY WITH PROPOFOL N/A 02/08/2019   Procedure: COLONOSCOPY WITH PROPOFOL;  Surgeon: Ladene Artist, MD;  [facility];  Service: Endoscopy;  Laterality: N/A;   FOOT ARTHROTOMY Right    POLYPECTOMY  02/08/2019   Procedure: POLYPECTOMY;  Surgeon: Ladene Artist, MD;  [facility];  Service: Endoscopy;;   TOE AMPUTATION Left 2012    There were no vitals filed for this visit.   Subjective Assessment - 05/08/21 1446     Subjective Pt presents to PT with reports of no current pain or discomfort. He has been compliant with HEP with no adverse effects noted. Pt is ready to begin PT treatment at this time.    Currently in Pain? No/denies    Pain Score 0-No pain           [facility] Adult  PT Treatment/Exercise:   Therapeutic Exercise:  NuStep lvl 6 x 4 min UE/LE while taking subjective Leg press 3x10 65lb Single leg press 2x10 35lb L Cybex hip abd 2x10 ea 25lbs Cybex hip ext 2x10 ea 25bs STS 3x10 15lb KB LAQ 3x10 6lb ea Seated march 2x20 6lb Seated hamstring curl 3x10 black tband   Manual Therapy: N/A   Neuromuscular re-ed: N/A   Therapeutic Activity: N/A   Modalities: N/A   Self Care: N/A   Consider / progression for next session:                                PT Short Term Goals - 04/12/21 1524       PT SHORT TERM GOAL #1   Title Pt will be compliant and knowledgeable with initial HEP for improved comfort and carryover    Baseline initial HEP given    Time 3    Period Weeks    Status Achieved    Target Date 04/02/21               PT Long Term  Goals - 04/12/21 1457       PT LONG TERM GOAL #1   Title Pt will improve FOTO score to no less than 59% as proxy for functional improvement    Baseline 54% function    Time 8    Period Weeks    Status On-going    Target Date 06/07/21      PT LONG TERM GOAL #2   Title Pt will increase reps in 30 Sec STS to no less than 10 reps for improved balance and mobility    Baseline 7 reps; 9 reps on 04/12/21    Time 8    Period Weeks    Status On-going    Target Date 06/07/21      PT LONG TERM GOAL #3   Title Pt will decrease TUG to no greater than 12 seconds for improved balance and mobility    Baseline 17 sec, increased instability when turning L - update on 11/17 - 12 sec    Time 8    Period Weeks    Status Achieved      PT LONG TERM GOAL #4   Title Pt will increase L hip flex/abd to no less than 5/5 for improved functional mobility    Baseline see flowsheet    Time 8    Period Weeks    Status On-going    Target Date 06/07/21                   Plan - 05/08/21 1500     Clinical Impression Statement Pt was to complete all prescribed exercises with no adverse effect or increase in pain. Therapy today continued to focus on improving proximal hip and LE strength, particularly on L. He continues to progress very well with therapy, showing continued improvement in LE strength and balance. He continues to benefit from skilled PT, will assess goals and status at next session. Will otherwise continue per POC.    PT Treatment/Interventions ADLs/Self Care Home Management;Cryotherapy;Moist Heat;Gait training;Stair training;Functional mobility training;Therapeutic activities;Therapeutic exercise;Balance training;Neuromuscular re-education;Patient/family education;Manual techniques;Taping    PT Next Visit Plan standing balance exercises; progress LE strength, gait, and balance as able    PT Home Exercise Plan Access Code: [email or web address]             Patient will benefit from skilled  therapeutic intervention in order to improve the following deficits and impairments:  Abnormal gait, Decreased activity tolerance, Decreased balance, Decreased endurance, Decreased mobility, Decreased range of motion, Decreased strength, Impaired sensation, Pain  Visit Diagnosis: Impaired functional mobility, balance, gait, and endurance  Muscle weakness (generalized)     Problem List Patient Active Problem List   Diagnosis Date Noted   Hip weakness 02/27/2021   Healthcare maintenance 02/27/2021   Left leg weakness 02/17/2021   COVID-19 vaccine administered 12/27/2020   Neck mass 11/16/2020   Erectile dysfunction 05/31/2020   Polypharmacy 05/22/2020   Porokeratosis 04/26/2020   Sprain of foot 01/25/2020   Neck muscle strain 11/22/2019   Patient cannot afford medications 10/18/2019   Hx of adenomatous colonic polyps    S/P ICD (internal cardiac defibrillator) procedure and BiV device,  07/25/17 Medtronic  07/26/2017   NICM (nonischemic cardiomyopathy) ([place]) 07/25/2017   Congestive heart failure, NYHA class 3, chronic, systolic ([place]) 68/07/2120   Atrial fibrillation, permanent ([place]) 07/25/2017   Memory change    Thrombocytopenia ([place])    Right temporal lobe infarction ([place]) 06/13/2017   Acute kidney injury superimposed on chronic kidney disease ([place]) 06/13/2017   Seizures ([place]) 06/12/2017   Sebaceous cyst of skin of breast, left 04/25/2017   Chronic kidney disease (CKD), stage IV (severe) ([place]) 01/22/2017   Secondary hyperparathyroidism of renal origin ([place]) 01/22/2017   Chronic anticoagulation 03/06/2016   Chronic, continuous use of opioids 10/18/2014   OSA on CPAP 04/04/2014   History of tobacco abuse 06/28/2013   DIABETIC  RETINOPATHY 02/24/2008   Insulin dependent type 2 diabetes mellitus ([place]) 07/24/2006   HYPERCHOLESTEROLEMIA 07/24/2006   Depression 07/24/2006   HYPERTENSION, BENIGN SYSTEMIC 07/24/2006   Coronary atherosclerosis 07/24/2006   GASTROESOPHAGEAL REFLUX,  NO ESOPHAGITIS 07/24/2006    Ward Chatters, PT 05/08/2021, 3:28 PM  [facility] [address] [place], [place], [address] Phone: [phone]   Fax:  [phone]  Name: SHEAMUS HASTING MRN: [id number] Date of Birth: 08-02-1950

## 2021-05-10 ENCOUNTER — Other Ambulatory Visit: Payer: Self-pay

## 2021-05-10 ENCOUNTER — Encounter (HOSPITAL_BASED_OUTPATIENT_CLINIC_OR_DEPARTMENT_OTHER): Payer: Self-pay | Admitting: Family

## 2021-05-10 ENCOUNTER — Ambulatory Visit (INDEPENDENT_AMBULATORY_CARE_PROVIDER_SITE_OTHER): Payer: HMO | Admitting: Family

## 2021-05-10 VITALS — BP 150/72 | HR 81 | Ht 72.0 in | Wt 231.5 lb

## 2021-05-10 DIAGNOSIS — I4821 Permanent atrial fibrillation: Secondary | ICD-10-CM

## 2021-05-10 DIAGNOSIS — N184 Chronic kidney disease, stage 4 (severe): Secondary | ICD-10-CM | POA: Diagnosis not present

## 2021-05-10 DIAGNOSIS — D6859 Other primary thrombophilia: Secondary | ICD-10-CM | POA: Diagnosis not present

## 2021-05-10 DIAGNOSIS — I5032 Chronic diastolic (congestive) heart failure: Secondary | ICD-10-CM | POA: Diagnosis not present

## 2021-05-10 DIAGNOSIS — Z95810 Presence of automatic (implantable) cardiac defibrillator: Secondary | ICD-10-CM

## 2021-05-10 DIAGNOSIS — I10 Essential (primary) hypertension: Secondary | ICD-10-CM | POA: Diagnosis not present

## 2021-05-10 MED ORDER — HYDRALAZINE HCL 100 MG PO TABS: 100.0000 mg | ORAL_TABLET | Freq: Three times a day (TID) | ORAL | 3 refills | Status: DC

## 2021-05-10 NOTE — Progress Notes (Signed)
Reveiwed: I agree with Dr. Koval's documentation and management. 

## 2021-05-10 NOTE — Patient Instructions (Signed)
Medication Instructions:  Your physician has recommended you make the following change in your medication:   Change: Hydralazine (Apresoline) 100mg  tablet 3 times daily    *If you need a refill on your cardiac medications before your next appointment, please call your pharmacy*   Lab Work: Your physician recommends that you return for lab work today- [facility] (3rd floor)  If you have labs (blood work) drawn today and your tests are completely normal, you will receive your results only by: MyChart Message (if you have MyChart) OR A paper copy in the mail If you have any lab test that is abnormal or we need to change your treatment, we will call you to review the results.   Testing/Procedures: None ordered today    Follow-Up: At [facility], you and your health needs are our priority.  As part of our continuing mission to provide you with exceptional heart care, we have created designated Provider Care Teams.  These Care Teams include your primary Cardiologist (physician) and Advanced Practice Providers (APPs -  Physician Assistants and Nurse Practitioners) who all work together to provide you with the care you need, when you need it.  We recommend signing up for the patient portal called "MyChart".  Sign up information is provided on this After Visit Summary.  MyChart is used to connect with patients for Virtual Visits (Telemedicine).  Patients are able to view lab/test results, encounter notes, upcoming appointments, etc.  Non-urgent messages can be sent to your provider as well.   To learn more about what you can do with MyChart, go to [email or web address].    Your next appointment:   March 3rd at [facility]   The format for your next appointment:   In Person  Provider:   Freada Bergeron, MD     Other Instructions Recommend using an arm blood pressure cuff. Omron is a good brand.   Keep up the good working drinking less than 2 liters of fluid per day.

## 2021-05-10 NOTE — Progress Notes (Signed)
Office Visit    Patient Name: Anthony [name] Date of Encounter: 05/10/2021  PCP:  Zenia Resides, MD   [place]  Cardiologist:  Freada Bergeron, MD  Advanced Practice Provider:  No care team member to display Electrophysiologist:  Virl Axe, MD      Chief Complaint    Anthony [name] is a 70 y.o. male with a hx of atrial fibrillation, AV ablation 07/2017 with PM/ICD placement, CAD s/p PCI in 2013 of circumflex lesion extending into second obtuse marginal branch with DES, hypertension, CKD 4, chronic systolic and diastolic heart failure, OSA presents today for heart failure follow-up  Past Medical History    Past Medical History:  Diagnosis Date   Arthritis    Benign neoplasm of descending colon    Benign neoplasm of sigmoid colon    Benign neoplasm of transverse colon    CAD in native artery    a. reported MI 2000, 2001, s/p stenting of the circumflex lesion extending into the second obtuse marginal branch with a drug-eluting stent in 2003. b. low risk nuc 2015.   Chronic atrial fibrillation (HCC)    Chronic combined systolic and diastolic CHF (congestive heart failure) ([place])    a. Previously diastolic, then EF 39-76% in 10/2016.   CKD (chronic kidney disease), stage III (HCC)    Depression    Diabetes mellitus    Edema of extremities    Erectile dysfunction    GERD (gastroesophageal reflux disease)    Hyperlipidemia    Hypertension    Myocardial infarction ([place]) 2000&2001   Obesity    Onychomycosis    OSA (obstructive sleep apnea)    S/P ICD (internal cardiac defibrillator) procedure and BiV device,  07/25/17 Medtronic  07/26/2017   Seizures ([place])    Sleep apnea    Stroke ([place])    Tubular adenoma of colon 10/2002   Past Surgical History:  Procedure Laterality Date   ANGIOPLASTY  2001   stent x 1   AV NODE ABLATION N/A 08/20/2017   Procedure: AV NODE ABLATION;  Surgeon: Deboraha Sprang, MD;  Location: [facility];  Service:  Cardiovascular;  Laterality: N/A;   BIV ICD INSERTION CRT-D N/A 07/25/2017   Procedure: BIV ICD INSERTION CRT-D;  Surgeon: Deboraha Sprang, MD;  Location: [facility];  Service: Cardiovascular;  Laterality: N/A;   COLONOSCOPY  2000?   negative   COLONOSCOPY WITH PROPOFOL N/A 02/08/2019   Procedure: COLONOSCOPY WITH PROPOFOL;  Surgeon: Ladene Artist, MD;  [facility];  Service: Endoscopy;  Laterality: N/A;   FOOT ARTHROTOMY Right    POLYPECTOMY  02/08/2019   Procedure: POLYPECTOMY;  Surgeon: Ladene Artist, MD;  Location: [facility];  Service: Endoscopy;;   TOE AMPUTATION Left 2012    Allergies  Allergies  Allergen Reactions   Enalapril Maleate Cough    History of Present Illness    Anthony [name] is a 70 y.o. male with a hx of atrial fibrillation, AV ablation 07/2017 with PM/ICD placement, CAD s/p PCI in 2013 of circumflex lesion extending into second obtuse marginal branch with DES, hypertension, CKD 4, chronic systolic and diastolic heart failure, OSA last seen 04/16/2021  Previous cardiac catheterization in 2013 with DES to circumflex extending into second obtuse marginal branch.  Myoview 2007 with no ischemia.  Echocardiogram 2018 LVEF 25 to 30% improved in 2019 to 55-60%.  Previous testing for amyloid with negative PYP.  Seen by  Dr. Meda Coffee 06/2020 with no edema, orthopnea, PND.  No changes were made at that time.  Hospitalized 01/2021 with leg weakness which normalized and neurology was consulted.    He saw Dr. Caryl Comes 03/26/2021 noted marked dyspnea on exertion.  He was compliant with CPAP.  His same regimen was continued.  He was seen 04/16/2021.  Noted to have elevated OptiVol level that morning by ICD check.  He noted dyspnea and lower extremity edema but no orthopnea nor PND.  He was recommended to reduce his intake of Lasix 40 mg daily as well as potassium added.  He presents today for follow-up.  Tells me he has reduced to drink 1 can of soda per day and 3  bottles of water.  He is drinking less than 2 L of fluid per day.  He notes marked improvement in his dyspnea and edema.  No orthopnea, PND, chest pain, palpitations.  He is tolerating Lasix without difficulty.  EKGs/Labs/Other Studies Reviewed:   The following studies were reviewed today:  Carotid duplex 01/2021 Right Carotid: The extracranial vessels were near-normal with only minimal  wall                thickening or plaque.   Left Carotid: The extracranial vessels were near-normal with only minimal  wall               thickening or plaque.   Vertebrals:  Bilateral vertebral arteries demonstrate antegrade flow.  Subclavians: Normal flow hemodynamics were seen in bilateral subclavian               arteries.   Echo 01/2021  1. Left ventricular ejection fraction, by estimation, is 60 to 65%. The  left ventricle has normal function. The left ventricle has no regional  wall motion abnormalities. There is mild left ventricular hypertrophy.  Left ventricular diastolic parameters  are indeterminate.   2. Pacing wires in RA/RV. Right ventricular systolic function is normal.  The right ventricular size is normal.   3. Left atrial size was moderately dilated.   4. The mitral valve is abnormal. Trivial mitral valve regurgitation. No  evidence of mitral stenosis.   5. The aortic valve was not well visualized. There is mild calcification  of the aortic valve. Aortic valve regurgitation is not visualized. Mild  aortic valve sclerosis is present, with no evidence of aortic valve  stenosis.   6. The inferior vena cava is normal in size with greater than 50%  respiratory variability, suggesting right atrial pressure of 3 mmHg.   EKG:  None ordered today.   Recent Labs: 02/16/2021: ALT 21 02/17/2021: TSH 0.935 04/16/2021: BNP 297.1; Hemoglobin 13.7; Platelets 118 04/23/2021: BUN 20; Creatinine, Ser 2.62; Potassium 4.0; Sodium 141  Recent Lipid Panel    Component Value Date/Time   CHOL 139  11/16/2020 0954   TRIG 92 11/16/2020 0954   HDL 35 (L) 11/16/2020 0954   CHOLHDL 4.0 11/16/2020 0954   CHOLHDL 3.2 02/28/2016 0906   VLDL 19 02/28/2016 0906   LDLCALC 86 11/16/2020 0954   LDLDIRECT 58 11/06/2011 1434    Risk Assessment/Calculations:   CHA2DS2-VASc Score = 5  This indicates a 7.2% annual risk of stroke. The patient's score is based upon: CHF History: 1 HTN History: 1 Diabetes History: 1 Stroke History: 0 Vascular Disease History: 1 Age Score: 1 Gender Score: 0   Home Medications   Current Meds  Medication Sig   acetaminophen (TYLENOL) 650 MG CR tablet Take 1,300 mg by mouth  2 (two) times daily as needed for pain.   apixaban (ELIQUIS) 5 MG TABS tablet Take 1 tablet (5 mg total) by mouth 2 (two) times daily.   calcitRIOL (ROCALTROL) 0.5 MCG capsule Take 0.5 mcg by mouth daily at 12 noon.    carvedilol (COREG) 25 MG tablet Take 1 tablet (25 mg total) by mouth 2 (two) times daily. Please make appt with Dr. Johney Frame (Cardiologist) before anymore refills. Thank you 1st attempt   Continuous Blood Gluc Receiver (FREESTYLE LIBRE 14 DAY READER) DEVI Apply topically as directed.   Continuous Blood Gluc Sensor (FREESTYLE LIBRE 14 DAY SENSOR) MISC 1 Device by Does not apply route every 14 (fourteen) days.   Dulaglutide (TRULICITY) 1.5 AY/3.0ZS SOPN Inject 1.5 mg into the skin once a week.   furosemide (LASIX) 40 MG tablet Take 1 tablet (40 mg total) by mouth daily.   gabapentin (NEURONTIN) 300 MG capsule Take 1 capsule (300 mg total) by mouth at bedtime.   glucose blood (ONETOUCH VERIO) test strip Use as instructed   hydrALAZINE (APRESOLINE) 50 MG tablet Take 50 mg by mouth 3 (three) times daily.   insulin aspart (NOVOLOG FLEXPEN) 100 UNIT/ML FlexPen Inject 10-15 Units into the skin 2 (two) times daily before lunch and supper. Or largest meal of the day.   insulin degludec (TRESIBA FLEXTOUCH) 200 UNIT/ML FlexTouch Pen Inject 30 Units into the skin daily.   Insulin Pen  Needle (PEN NEEDLES) 32G X 4 MM MISC 1 Device by Does not apply route as directed.   Lancets (ONETOUCH ULTRASOFT) lancets Use as instructed   loratadine (CLARITIN) 10 MG tablet Take 10 mg by mouth daily as needed for allergies.   losartan (COZAAR) 25 MG tablet Take 1 tablet (25 mg total) by mouth daily.   Multiple Vitamin (MULTIVITAMIN) tablet Take 1 tablet by mouth daily.     nitroGLYCERIN (NITROSTAT) 0.4 MG SL tablet Place 1 tablet (0.4 mg total) under the tongue every 5 (five) minutes as needed for chest pain. If no relief by 3rd tab, call 911   omeprazole (PRILOSEC) 40 MG capsule TAKE ONE CAPSULE BY MOUTH DAILY BEFORE SUPPER (Patient taking differently: Take 40 mg by mouth daily.)   oxymorphone (OPANA) 5 MG tablet Take 10-15 mg by mouth daily. Per Dr Brien Few   PARoxetine (PAXIL) 30 MG tablet TAKE 1 TABLET BY MOUTH EVERY DAY (Patient taking differently: Take 30 mg by mouth daily.)   potassium chloride SA (KLOR-CON M20) 20 MEQ tablet Take 1 tablet (20 mEq total) by mouth daily. Please take 40mg  (2 tablets) for 3 days, then switch to 20mg  (1 tablet) daily. Please return to our office on 11/28 for lab work.   rosuvastatin (CRESTOR) 20 MG tablet TAKE 1 TABLET BY MOUTH EVERY DAY (Patient taking differently: Take 20 mg by mouth daily.)   tamsulosin (FLOMAX) 0.4 MG CAPS capsule Take 0.4 mg by mouth.     Review of Systems      All other systems reviewed and are otherwise negative except as noted above.  Physical Exam    VS:  BP (!) 150/72 (BP Location: Left Arm, Patient Position: Sitting, Cuff Size: Large)    Pulse 81    Ht 6' (1.829 m)    Wt 231 lb 8 oz (105 kg)    SpO2 98%    BMI 31.40 kg/m  , BMI Body mass index is 31.4 kg/m.  Wt Readings from Last 3 Encounters:  05/10/21 231 lb 8 oz (105 kg)  04/16/21 232 lb  3.2 oz (105.3 kg)  04/09/21 235 lb (106.6 kg)     GEN: Well nourished, well developed, in no acute distress. HEENT: normal. Neck: Supple, no JVD, carotid bruits, or  masses. Cardiac: RRR, no murmurs, rubs, or gallops. No clubbing, cyanosis, edema.  Radials/PT 2+ and equal bilaterally.  Respiratory:  Respirations regular and unlabored, clear to auscultation bilaterally. GI: Soft, nontender, nondistended. MS: No deformity or atrophy. Skin: Warm and dry, no rash. Neuro:  Strength and sensation are intact. Psych: Normal affect.  Assessment & Plan    CAD - No chest pain. Dyspnea due to volume overload, management below. EKG today no acute changes. GDMT includes carvedilol, coreg. No aspirin due to chronic anticoagulation. Heart healthy diet and regular cardiovascular exercise encouraged.    HFpEF -volume status, edema, dyspnea improved with the addition of Lasix 40 mg daily which we will continue.  Update BMP.  Heart failure education provided.  S/p PPM/ICD - Follows with Dr. Caryl Comes.   Atrial fibrillation / Chronic anticoagulation - Rate controlled today.  Continue Eliquis 5mg  BID. Does not meet dose reduction criteria. CHA2DS2-VASc Score = 5 [CHF History: 1, HTN History: 1, Diabetes History: 1, Stroke History: 0, Vascular Disease History: 1, Age Score: 1, Gender Score: 0].  Therefore, the patient's annual risk of stroke is 7.2 %.     DM2 - Continue to follow with PCP.  HTN - BP elevated. Increase Hydralazine to 100mg  TID. Will defer increasing dose of Losartan due to CKD.   CKD - Follows with Dr. Posey Pronto of nephrology. Careful titration of diuretic and antihypertensive.  BMP today.   Disposition: Follow up in March as scheduled with Freada Bergeron, MD or APP.  Signed, Loel Dubonnet, NP 05/10/2021, 2:10 PM [place]

## 2021-05-11 LAB — BASIC METABOLIC PANEL
BUN/Creatinine Ratio: 7 — ABNORMAL LOW (ref 10–24)
BUN: 19 mg/dL (ref 8–27)
CO2: 26 mmol/L (ref 20–29)
Calcium: 9.4 mg/dL (ref 8.6–10.2)
Chloride: 101 mmol/L (ref 96–106)
Creatinine, Ser: 2.55 mg/dL — ABNORMAL HIGH (ref 0.76–1.27)
Glucose: 167 mg/dL — ABNORMAL HIGH (ref 70–99)
Potassium: 4 mmol/L (ref 3.5–5.2)
Sodium: 142 mmol/L (ref 134–144)
eGFR: 26 mL/min/{1.73_m2} — ABNORMAL LOW (ref >59)

## 2021-05-11 NOTE — Progress Notes (Signed)
Results called and left on VM ok per pt. DPR

## 2021-05-15 ENCOUNTER — Ambulatory Visit: Payer: HMO

## 2021-05-15 ENCOUNTER — Other Ambulatory Visit: Payer: Self-pay

## 2021-05-15 DIAGNOSIS — M6281 Muscle weakness (generalized): Secondary | ICD-10-CM

## 2021-05-15 DIAGNOSIS — Z7409 Other reduced mobility: Secondary | ICD-10-CM

## 2021-05-15 NOTE — Therapy (Signed)
[facility] [place], [place], [address] Phone: [phone]   Fax:  [phone]  Physical Therapy Treatment  Patient Details  Name: Anthony [name] MRN: [id number] Date of Birth: 05/24/1951 Referring Provider (PT): Zenia Resides, MD   Encounter Date: 05/15/2021   PT End of Session - 05/15/21 1448     Visit Number 13    Number of Visits 17    Date for PT Re-Evaluation 06/07/21    Authorization Type [employer]    Progress Note Due on Visit 19   performed at visit 9   PT Start Time 1448    Activity Tolerance Patient tolerated treatment well    Behavior During Therapy [facility] for tasks assessed/performed             Past Medical History:  Diagnosis Date   Arthritis    Benign neoplasm of descending colon    Benign neoplasm of sigmoid colon    Benign neoplasm of transverse colon    CAD in native artery    a. reported MI 2000, 2001, s/p stenting of the circumflex lesion extending into the second obtuse marginal branch with a drug-eluting stent in 2003. b. low risk nuc 2015.   Chronic atrial fibrillation (HCC)    Chronic combined systolic and diastolic CHF (congestive heart failure) ([place])    a. Previously diastolic, then EF 94-76% in 10/2016.   CKD (chronic kidney disease), stage III (HCC)    Depression    Diabetes mellitus    Edema of extremities    Erectile dysfunction    GERD (gastroesophageal reflux disease)    Hyperlipidemia    Hypertension    Myocardial infarction ([place]) 2000&2001   Obesity    Onychomycosis    OSA (obstructive sleep apnea)    S/P ICD (internal cardiac defibrillator) procedure and BiV device,  07/25/17 Medtronic  07/26/2017   Seizures ([place])    Sleep apnea    Stroke ([place])    Tubular adenoma of colon 10/2002    Past Surgical History:  Procedure Laterality Date   ANGIOPLASTY  2001   stent x 1   AV NODE ABLATION N/A 08/20/2017   Procedure: AV NODE ABLATION;  Surgeon: Deboraha Sprang, MD;  Location: [facility];  Service: Cardiovascular;  Laterality: N/A;   BIV ICD INSERTION CRT-D N/A 07/25/2017   Procedure: BIV ICD INSERTION CRT-D;  Surgeon: Deboraha Sprang, MD;  Location: [facility];  Service: Cardiovascular;  Laterality: N/A;   COLONOSCOPY  2000?   negative   COLONOSCOPY WITH PROPOFOL N/A 02/08/2019   Procedure: COLONOSCOPY WITH PROPOFOL;  Surgeon: Ladene Artist, MD;  [facility];  Service: Endoscopy;  Laterality: N/A;   FOOT ARTHROTOMY Right    POLYPECTOMY  02/08/2019   Procedure: POLYPECTOMY;  Surgeon: Ladene Artist, MD;  [facility];  Service: Endoscopy;;   TOE AMPUTATION Left 2012    There were no vitals filed for this visit.   Subjective Assessment - 05/15/21 1459     Subjective Pt presented to PT stating he did not feel very good, noting he felt like he was starting to develop a bad cold. He notes that he would have cancelled, but he is unsure of the clinic attendance policy. PT discussed with pt the attendance policy and that if he is feeling very sick, it would be best to withhold treatment today. Patient agreed with plan and session ended with no interventions today. Rescheduled for  a later date.               PT Short Term Goals - 04/12/21 1524       PT SHORT TERM GOAL #1   Title Pt will be compliant and knowledgeable with initial HEP for improved comfort and carryover    Baseline initial HEP given    Time 3    Period Weeks    Status Achieved    Target Date 04/02/21               PT Long Term Goals - 04/12/21 1457       PT LONG TERM GOAL #1   Title Pt will improve FOTO score to no less than 59% as proxy for functional improvement    Baseline 54% function    Time 8    Period Weeks    Status On-going    Target Date 06/07/21      PT LONG TERM GOAL #2   Title Pt will increase reps in 30 Sec STS to no less than 10 reps for improved balance and mobility    Baseline 7 reps; 9 reps on  04/12/21    Time 8    Period Weeks    Status On-going    Target Date 06/07/21      PT LONG TERM GOAL #3   Title Pt will decrease TUG to no greater than 12 seconds for improved balance and mobility    Baseline 17 sec, increased instability when turning L - update on 11/17 - 12 sec    Time 8    Period Weeks    Status Achieved      PT LONG TERM GOAL #4   Title Pt will increase L hip flex/abd to no less than 5/5 for improved functional mobility    Baseline see flowsheet    Time 8    Period Weeks    Status On-going    Target Date 06/07/21                    Patient will benefit from skilled therapeutic intervention in order to improve the following deficits and impairments:     Visit Diagnosis: Impaired functional mobility, balance, gait, and endurance  Muscle weakness (generalized)     Problem List Patient Active Problem List   Diagnosis Date Noted   Hip weakness 02/27/2021   Healthcare maintenance 02/27/2021   Left leg weakness 02/17/2021   COVID-19 vaccine administered 12/27/2020   Neck mass 11/16/2020   Erectile dysfunction 05/31/2020   Polypharmacy 05/22/2020   Porokeratosis 04/26/2020   Sprain of foot 01/25/2020   Neck muscle strain 11/22/2019   Patient cannot afford medications 10/18/2019   Hx of adenomatous colonic polyps    S/P ICD (internal cardiac defibrillator) procedure and BiV device,  07/25/17 Medtronic  07/26/2017   NICM (nonischemic cardiomyopathy) ([place]) 07/25/2017   Congestive heart failure, NYHA class 3, chronic, systolic ([place]) 77/82/4235   Atrial fibrillation, permanent ([place]) 07/25/2017   Memory change    Thrombocytopenia ([place])    Right temporal lobe infarction ([place]) 06/13/2017   Acute kidney injury superimposed on chronic kidney disease ([place]) 06/13/2017   Seizures ([place]) 06/12/2017   Sebaceous cyst of skin of breast, left 04/25/2017   Chronic kidney disease (CKD), stage IV (severe) ([place]) 01/22/2017   Secondary hyperparathyroidism of  renal origin ([place]) 01/22/2017   Chronic anticoagulation 03/06/2016   Chronic, continuous use of opioids 10/18/2014   OSA on CPAP 04/04/2014  History of tobacco abuse 06/28/2013   DIABETIC  RETINOPATHY 02/24/2008   Insulin dependent type 2 diabetes mellitus ([place]) 07/24/2006   HYPERCHOLESTEROLEMIA 07/24/2006   Depression 07/24/2006   HYPERTENSION, BENIGN SYSTEMIC 07/24/2006   Coronary atherosclerosis 07/24/2006   GASTROESOPHAGEAL REFLUX, NO ESOPHAGITIS 07/24/2006    Ward Chatters, PT 05/15/2021, 3:04 PM  [facility] [address] [place], [place], [address] Phone: [phone]   Fax:  [phone]  Name: Anthony [name] MRN: [id number] Date of Birth: 13-Aug-1950

## 2021-05-18 NOTE — Progress Notes (Signed)
Remote ICD transmission.   

## 2021-05-22 ENCOUNTER — Other Ambulatory Visit: Payer: Self-pay | Admitting: Family Medicine

## 2021-05-22 DIAGNOSIS — N184 Chronic kidney disease, stage 4 (severe): Secondary | ICD-10-CM

## 2021-05-29 ENCOUNTER — Ambulatory Visit (INDEPENDENT_AMBULATORY_CARE_PROVIDER_SITE_OTHER): Payer: HMO | Admitting: Pharmacist

## 2021-05-29 ENCOUNTER — Ambulatory Visit: Payer: HMO | Attending: Family Medicine

## 2021-05-29 ENCOUNTER — Other Ambulatory Visit: Payer: Self-pay

## 2021-05-29 ENCOUNTER — Encounter: Payer: Self-pay | Admitting: Pharmacist

## 2021-05-29 DIAGNOSIS — Z794 Long term (current) use of insulin: Secondary | ICD-10-CM

## 2021-05-29 DIAGNOSIS — Z7409 Other reduced mobility: Secondary | ICD-10-CM

## 2021-05-29 DIAGNOSIS — M6281 Muscle weakness (generalized): Secondary | ICD-10-CM | POA: Insufficient documentation

## 2021-05-29 DIAGNOSIS — E119 Type 2 diabetes mellitus without complications: Secondary | ICD-10-CM | POA: Diagnosis not present

## 2021-05-29 MED ORDER — TRESIBA FLEXTOUCH 200 UNIT/ML ~~LOC~~ SOPN: 20.0000 [IU] | PEN_INJECTOR | Freq: Every day | SUBCUTANEOUS | Status: DC

## 2021-05-29 NOTE — Progress Notes (Signed)
Reviewed: I agree with Dr. Koval's documentation and management. 

## 2021-05-29 NOTE — Patient Instructions (Signed)
Nice to see you today.   Decrease Tresiba (insuline Degludec) from 30 to 20 units once daily in the morning.  Continue Novolog 5 units prior to largest meal.   STOP Auto fill of Trulicity.  Please call and cancel that mail-order.   Follow-up in Pharmacy clinic with [facility].

## 2021-05-29 NOTE — Therapy (Signed)
[place], [place], [address] Phone: [phone]   Fax:  [phone]  Physical Therapy Treatment/Discharge  Patient Details  Name: Anthony [name] MRN: [id number] Date of Birth: 01-Mar-1951 Referring Provider (PT): Zenia Resides, MD   Encounter Date: 05/29/2021   PT End of Session - 05/29/21 1735     Visit Number 14    Number of Visits 17    Date for PT Re-Evaluation 06/07/21    Authorization Type [employer]    Progress Note Due on Visit 19   performed at visit 9   PT Start Time 1735    PT Stop Time 1805    PT Time Calculation (min) 30 min    Activity Tolerance Patient tolerated treatment well    Behavior During Therapy [facility] for tasks assessed/performed             Past Medical History:  Diagnosis Date   Arthritis    Benign neoplasm of descending colon    Benign neoplasm of sigmoid colon    Benign neoplasm of transverse colon    CAD in native artery    a. reported MI 2000, 2001, s/p stenting of the circumflex lesion extending into the second obtuse marginal branch with a drug-eluting stent in 2003. b. low risk nuc 2015.   Chronic atrial fibrillation (HCC)    Chronic combined systolic and diastolic CHF (congestive heart failure) ([place])    a. Previously diastolic, then EF 94-70% in 10/2016.   CKD (chronic kidney disease), stage III (HCC)    Depression    Diabetes mellitus    Edema of extremities    Erectile dysfunction    GERD (gastroesophageal reflux disease)    Hyperlipidemia    Hypertension    Myocardial infarction ([place]) 2000&2001   Obesity    Onychomycosis    OSA (obstructive sleep apnea)    S/P ICD (internal cardiac defibrillator) procedure and BiV device,  07/25/17 Medtronic  07/26/2017   Seizures ([place])    Sleep apnea    Stroke ([place])    Tubular adenoma of colon 10/2002    Past Surgical History:  Procedure Laterality Date   ANGIOPLASTY  2001   stent x 1   AV NODE ABLATION  N/A 08/20/2017   Procedure: AV NODE ABLATION;  Surgeon: Deboraha Sprang, MD;  Location: [facility];  Service: Cardiovascular;  Laterality: N/A;   BIV ICD INSERTION CRT-D N/A 07/25/2017   Procedure: BIV ICD INSERTION CRT-D;  Surgeon: Deboraha Sprang, MD;  Location: [facility];  Service: Cardiovascular;  Laterality: N/A;   COLONOSCOPY  2000?   negative   COLONOSCOPY WITH PROPOFOL N/A 02/08/2019   Procedure: COLONOSCOPY WITH PROPOFOL;  Surgeon: Ladene Artist, MD;  Location: [facility];  Service: Endoscopy;  Laterality: N/A;   FOOT ARTHROTOMY Right    POLYPECTOMY  02/08/2019   Procedure: POLYPECTOMY;  Surgeon: Ladene Artist, MD;  Location: [facility];  Service: Endoscopy;;   TOE AMPUTATION Left 2012    There were no vitals filed for this visit.   Subjective Assessment - 05/29/21 1735     Subjective Pt presents to PT stating he is feeling a good bit better than last week, but he does continue to have some lower back stiffness. Pt has been compliant with his HEP with no adverse effect. Pt is ready to begin PT treatment at this time.    Currently in Pain? No/denies    Pain Score 0-No  pain           [facility] Adult PT Treatment/Exercise:   Therapeutic Exercise:  NuStep lvl 6 x 4 min UE/LE while taking subjective STS x 10 Seated march x 20 black TB Seated clamshell x 15 black TB Standing hip abd/ext x 15 GTB Tandem stance x 30" ea  Therapeutic Activity: Assessment of goals, tests/measures, and outcomes for purposes of discharge planning     [facility] PT Assessment - 05/29/21 0001       Observation/Other Assessments   Focus on Therapeutic Outcomes (FOTO)  49% function      Strength   Left Hip Flexion 5/5    Left Hip ABduction 4/5                                    PT Education - 05/29/21 1813     Education Details HEP and discharge plan    Person(s) Educated Patient    Methods Explanation;Demonstration;Handout    Comprehension  Verbalized understanding;Returned demonstration              PT Short Term Goals - 04/12/21 1524       PT SHORT TERM GOAL #1   Title Pt will be compliant and knowledgeable with initial HEP for improved comfort and carryover    Baseline initial HEP given    Time 3    Period Weeks    Status Achieved    Target Date 04/02/21               PT Long Term Goals - 05/29/21 1817       PT LONG TERM GOAL #1   Title Pt will improve FOTO score to no less than 59% as proxy for functional improvement    Baseline 54% function; 49% function on 05/29/21    Time 8    Period Weeks    Status Partially Met    Target Date 06/07/21      PT LONG TERM GOAL #2   Title Pt will increase reps in 30 Sec STS to no less than 10 reps for improved balance and mobility    Baseline 7 reps; 9 reps on 04/12/21; 10 reps on 05/29/21    Time 8    Period Weeks    Status Achieved    Target Date 06/07/21      PT LONG TERM GOAL #3   Title Pt will decrease TUG to no greater than 12 seconds for improved balance and mobility    Baseline 17 sec, increased instability when turning L - update on 11/17 - 12 sec    Time 8    Period Weeks    Status Achieved      PT LONG TERM GOAL #4   Title Pt will increase L hip flex/abd to no less than 5/5 for improved functional mobility    Baseline see flowsheet    Time 8    Period Weeks    Status Partially Met    Target Date 06/07/21                   Plan - 05/29/21 1818     Clinical Impression Statement Pt was able to complete all prescribed exercises and demonstrated knowledge of HEP with no adverse effect. Over the course of PT treatment, pt was able to improve LE strength and functional mobility, as evidenced by meeting 30"STS and TUG goals as well  as improving LE MMT. He should continue to improve with HEP compliance and no longer requires formal PT. Pt in agreement with current plan and agreeable to d/c at this time.    PT Treatment/Interventions ADLs/Self  Care Home Management;Cryotherapy;Moist Heat;Gait training;Stair training;Functional mobility training;Therapeutic activities;Therapeutic exercise;Balance training;Neuromuscular re-education;Patient/family education;Manual techniques;Taping    PT Home Exercise Plan Access Code: [email or web address]    Consulted and Agree with Plan of Care Patient             Patient will benefit from skilled therapeutic intervention in order to improve the following deficits and impairments:     Visit Diagnosis: Impaired functional mobility, balance, gait, and endurance  Muscle weakness (generalized)     Problem List Patient Active Problem List   Diagnosis Date Noted   Hip weakness 02/27/2021   Healthcare maintenance 02/27/2021   Left leg weakness 02/17/2021   COVID-19 vaccine administered 12/27/2020   Neck mass 11/16/2020   Erectile dysfunction 05/31/2020   Polypharmacy 05/22/2020   Porokeratosis 04/26/2020   Sprain of foot 01/25/2020   Neck muscle strain 11/22/2019   Patient cannot afford medications 10/18/2019   Hx of adenomatous colonic polyps    S/P ICD (internal cardiac defibrillator) procedure and BiV device,  07/25/17 Medtronic  07/26/2017   NICM (nonischemic cardiomyopathy) ([place]) 07/25/2017   Congestive heart failure, NYHA class 3, chronic, systolic ([place]) 60/63/0160   Atrial fibrillation, permanent ([place]) 07/25/2017   Memory change    Thrombocytopenia ([place])    Right temporal lobe infarction ([place]) 06/13/2017   Acute kidney injury superimposed on chronic kidney disease ([place]) 06/13/2017   Seizures ([place]) 06/12/2017   Sebaceous cyst of skin of breast, left 04/25/2017   Chronic kidney disease (CKD), stage IV (severe) ([place]) 01/22/2017   Secondary hyperparathyroidism of renal origin ([place]) 01/22/2017   Chronic anticoagulation 03/06/2016   Chronic, continuous use of opioids 10/18/2014   OSA on CPAP 04/04/2014   History of tobacco abuse 06/28/2013   DIABETIC  RETINOPATHY 02/24/2008   Insulin  dependent type 2 diabetes mellitus ([place]) 07/24/2006   HYPERCHOLESTEROLEMIA 07/24/2006   Depression 07/24/2006   HYPERTENSION, BENIGN SYSTEMIC 07/24/2006   Coronary atherosclerosis 07/24/2006   GASTROESOPHAGEAL REFLUX, NO ESOPHAGITIS 07/24/2006    Ward Chatters, PT 05/29/2021, 6:20 PM  [facility] [address] [place], [place], [address] Phone: [phone]   Fax:  [phone]  Name: Anthony [name] MRN: [id number] Date of Birth: [birth year]   PHYSICAL THERAPY DISCHARGE SUMMARY  Visits from Start of Care: 14  Current functional level related to goals / functional outcomes: See goals and objective findings   Remaining deficits: See goals and objective findings   Education / Equipment: HEP   Patient agrees to discharge. Patient goals were  mostly met . Patient is being discharged due to being pleased with the current functional level.

## 2021-05-29 NOTE — Progress Notes (Signed)
° ° °  S:     Chief Complaint  Patient presents with   Medication Management    diabetes    Patient arrives in good spirits, ambulates without assistance.  Presents for diabetes evaluation, education, and management. Patient has been seen for many years in pharmacy clinic.  Last seen by [facility] provider, Dr. Chauncey Reading.   Patient reports Diabetes was diagnosed in 2008.    Insurance coverage/medication affordability: HTA - continues to have CGM coverage.  Medication adherence reported excellent .   Current diabetes medications include: Trulicity (dulaglutide) 1.5mg , Tresiba (insulin degludec) 30 units daily and Novolog (insulin aspart) 5 units prior to meals (not taking 10units due to low readings.   Patient reports hypoglycemic events on multiple occasions.   Patient reported dietary habits: Eats 2 larger meals/day    O:  Physical Exam Constitutional:      Appearance: Normal appearance.  Neurological:     Mental Status: He is alert.  Psychiatric:        Mood and Affect: Mood normal.    Review of Systems  All other systems reviewed and are negative.  Lab Results  Component Value Date   HGBA1C 9.1 (A) 12/27/2020   Vitals:   05/29/21 1105  BP: (!) 142/76  Pulse: 71  SpO2: 98%    Lipid Panel     Component Value Date/Time   CHOL 139 11/16/2020 0954   TRIG 92 11/16/2020 0954   HDL 35 (L) 11/16/2020 0954   CHOLHDL 4.0 11/16/2020 0954   CHOLHDL 3.2 02/28/2016 0906   VLDL 19 02/28/2016 0906   LDLCALC 86 11/16/2020 0954   LDLDIRECT 58 11/06/2011 1434    CGM readings: High Readings 40% with only 7 % very high Target:  54% Low Readings 6 % with only 1% very low  Clinical Atherosclerotic Cardiovascular Disease (ASCVD): Yes  The 10-year ASCVD risk score (Arnett DK, et al., 2019) is: 37.7%   Values used to calculate the score:     Age: 71 years     Sex: Male     Is Non-Hispanic African American: Yes     Diabetic: Yes     Tobacco smoker: No     Systolic Blood  Pressure: 142 mmHg     Is BP treated: Yes     HDL Cholesterol: 35 mg/dL     Total Cholesterol: 139 mg/dL   GMI : 7/1 Variability 39%   A/P: Diabetes longstanding with fairly good control however the frequency of his low readings is higher than ideal.  Patient is able to verbalize appropriate hypoglycemia management plan.  Medication adherence appears good. Control is suboptimal due to excess basal insulin. -Decreased dose of basal insulin from 30  to 20 units daily  (insulin degludec).  -Continued  rapid insulin 5 (insulin aspart) prior to large meals.  -Continued GLP-1 Trulicity (dulaglutide) at 1.5mg  weekly on Thursday.  (Consider dose increase with patient taking two of his 1.5mg  doses at the same time at next visit - with decreased dose of basal most likely.  - Not currently taking  SGLT2-I  - Consider restart following discussion with Dr. Caron Presume. -Extensively discussed pathophysiology of diabetes, recommended lifestyle interventions, dietary effects on blood sugar control. -Counseled on s/sx of and management of hypoglycemia   Written patient instructions provided.  Total time in face to face counseling 25 minutes.   Follow up Pharmacist 3 weeks.   Patient seen with Gala Murdoch, PharmD Candidate.

## 2021-05-29 NOTE — Assessment & Plan Note (Signed)
Diabetes longstanding with fairly good control however the frequency of his low readings is higher than ideal.  Patient is able to verbalize appropriate hypoglycemia management plan.  Medication adherence appears good. Control is suboptimal due to excess basal insulin. -Decreased dose of basal insulin from 30  to 20 units daily  (insulin degludec).  -Continued  rapid insulin 5 (insulin aspart) prior to large meals.  -Continued GLP-1 Trulicity (dulaglutide) at 1.5mg  weekly on Thursday.  (Consider dose increase with patient taking two of his 1.5mg  doses at the same time at next visit - with decreased dose of basal most likely.  -Not currently taking SGLT2-I  - Consider restart following discussion with Dr. Caron Presume. -Extensively discussed pathophysiology of diabetes, recommended lifestyle interventions, dietary effects on blood sugar control. -Counseled on s/sx of and management of hypoglycemia

## 2021-05-31 ENCOUNTER — Other Ambulatory Visit (HOSPITAL_COMMUNITY): Payer: Self-pay

## 2021-06-04 ENCOUNTER — Other Ambulatory Visit: Payer: Self-pay

## 2021-06-04 ENCOUNTER — Ambulatory Visit (INDEPENDENT_AMBULATORY_CARE_PROVIDER_SITE_OTHER): Payer: HMO

## 2021-06-04 DIAGNOSIS — Z23 Encounter for immunization: Secondary | ICD-10-CM | POA: Diagnosis not present

## 2021-06-15 ENCOUNTER — Other Ambulatory Visit: Payer: Self-pay | Admitting: Family Medicine

## 2021-06-21 ENCOUNTER — Ambulatory Visit (INDEPENDENT_AMBULATORY_CARE_PROVIDER_SITE_OTHER): Payer: HMO | Admitting: Pharmacist

## 2021-06-21 ENCOUNTER — Other Ambulatory Visit: Payer: Self-pay

## 2021-06-21 ENCOUNTER — Other Ambulatory Visit (HOSPITAL_COMMUNITY): Payer: Self-pay

## 2021-06-21 ENCOUNTER — Encounter: Payer: Self-pay | Admitting: Pharmacist

## 2021-06-21 VITALS — BP 119/82 | HR 70 | Wt 223.0 lb

## 2021-06-21 DIAGNOSIS — Z794 Long term (current) use of insulin: Secondary | ICD-10-CM | POA: Diagnosis not present

## 2021-06-21 DIAGNOSIS — I428 Other cardiomyopathies: Secondary | ICD-10-CM

## 2021-06-21 DIAGNOSIS — Z95810 Presence of automatic (implantable) cardiac defibrillator: Secondary | ICD-10-CM | POA: Diagnosis not present

## 2021-06-21 DIAGNOSIS — I5032 Chronic diastolic (congestive) heart failure: Secondary | ICD-10-CM | POA: Diagnosis not present

## 2021-06-21 DIAGNOSIS — E119 Type 2 diabetes mellitus without complications: Secondary | ICD-10-CM | POA: Diagnosis not present

## 2021-06-21 LAB — POCT GLYCOSYLATED HEMOGLOBIN (HGB A1C): HbA1c, POC (controlled diabetic range): 8.7 % — AB (ref 0.0–7.0)

## 2021-06-21 MED ORDER — TRESIBA FLEXTOUCH 200 UNIT/ML ~~LOC~~ SOPN: 14.0000 [IU] | PEN_INJECTOR | Freq: Every day | SUBCUTANEOUS | 3 refills | Status: DC

## 2021-06-21 MED ORDER — FUROSEMIDE 40 MG PO TABS: 40.0000 mg | ORAL_TABLET | Freq: Every day | ORAL | 1 refills | Status: DC

## 2021-06-21 MED ORDER — TRULICITY 1.5 MG/0.5ML ~~LOC~~ SOAJ: 3.0000 mg | SUBCUTANEOUS | 3 refills | Status: DC

## 2021-06-21 MED ORDER — LOSARTAN POTASSIUM 25 MG PO TABS: 25.0000 mg | ORAL_TABLET | Freq: Every day | ORAL | 3 refills | Status: DC

## 2021-06-21 MED ORDER — NOVOLOG FLEXPEN 100 UNIT/ML ~~LOC~~ SOPN: PEN_INJECTOR | SUBCUTANEOUS | 11 refills | Status: DC

## 2021-06-21 MED ORDER — NOVOLOG FLEXPEN 100 UNIT/ML ~~LOC~~ SOPN: 5.0000 [IU] | PEN_INJECTOR | Freq: Two times a day (BID) | SUBCUTANEOUS | 11 refills | Status: DC

## 2021-06-21 NOTE — Progress Notes (Addendum)
S:    Chief Complaint  Patient presents with   Medication Management    Diabetes   Patient arrives in good spirits ambulating independently.  Presents for diabetes evaluation, education, and management Patient was last seen by Dr. Valentina Lucks on 06/21/2021.   Patient reports Diabetes was diagnosed in 2008.   Insurance coverage/medication affordability: [employer]  Medication adherence reported as good.   Current diabetes medications include: Trulicity 3mg  (2X1.5mg ) weekly, Novolog 5units-8units before dinner depending on the meal, Tresiba 20units daily Current hypertension medications include: Carvedilol 25mg  BID, furosemide 40mg  daily, hydralazine 100mg  TID, Losartan 25mg  daily Current hyperlipidemia medications include: Rosuvastatin 20mg  daily  Patient reports hypoglycemic events. Patient manages low blood sugars with peanut butter crackers and an orange.  Patient reported dietary habits: Eats 2 meals/day Lunch: sandwich, chips, cookies Dinner: Potato, salad Drinks: Water, sugar free beverages   Patient reports nocturia (nighttime urination).  Patient denies neuropathy (nerve pain). Patient denies visual changes. Patient reports self foot exams.    O:  Physical Exam Vitals reviewed.  Constitutional:      Appearance: Normal appearance.  Pulmonary:     Effort: Pulmonary effort is normal.  Musculoskeletal:     Right lower leg: No edema.     Left lower leg: No edema.  Neurological:     Mental Status: He is alert.  Psychiatric:        Mood and Affect: Mood normal.        Behavior: Behavior normal.        Thought Content: Thought content normal.    Review of Systems  Neurological:  Negative for tingling and headaches.  All other systems reviewed and are negative.  Lab Results  Component Value Date   HGBA1C 8.7 (A) 06/21/2021   Vitals:   06/21/21 1117  BP: 119/82  Pulse: 70  SpO2: 95%    Lipid Panel     Component Value Date/Time   CHOL 139  11/16/2020 0954   TRIG 92 11/16/2020 0954   HDL 35 (L) 11/16/2020 0954   CHOLHDL 4.0 11/16/2020 0954   CHOLHDL 3.2 02/28/2016 0906   VLDL 19 02/28/2016 0906   LDLCALC 86 11/16/2020 0954   LDLDIRECT 58 11/06/2011 1434   CGM Report: Jan 13 - Jun 21, 2021 Very High: 3% High: 32% Target Range: 64% Low and Very Low: 1% Average Blood Glucose = 159mg /dL GMI = 7.1% Glucose Variability: 32.6%  Clinical Atherosclerotic Cardiovascular Disease (ASCVD): Yes  The 10-year ASCVD risk score (Arnett DK, et al., 2019) is: 28.6%   Values used to calculate the score:     Age: 71 years     Sex: Male     Is Non-Hispanic African American: Yes     Diabetic: Yes     Tobacco smoker: No     Systolic Blood Pressure: 258 mmHg     Is BP treated: Yes     HDL Cholesterol: 35 mg/dL     Total Cholesterol: 139 mg/dL   A/P: Diabetes longstanding currently uncontrolled. Patient is able to verbalize appropriate hypoglycemia management plan. Medication adherence appears good. Control is suboptimal due to unoptimized insulin regimen. -Decreased dose of basal insulin Tresiba (insulin degludec) to 14units daily. -Increased dose of  rapid insulin Novolog (insulin aspart) to 5units before lunch and 5-8units before dinner depending on carb intake.  -Continued GLP-1 Trulicity (generic name dulaglutide) 3mg  once weekly.  -Extensively discussed pathophysiology of diabetes, recommended lifestyle interventions, dietary effects on blood sugar control -Counseled on s/sx of  and management of hypoglycemia -Next A1C anticipated 3mo.   ASCVD risk - secondary prevention in patient with diabetes. Last LDL is not controlled. ASCVD risk score is >20%  - high intensity statin indicated. -Continued rosuvastatin 20 mg. Consider increasing to 40mg  in future visits.  Hypertension longstanding currently controlled.  Blood pressure goal = <130/80 mmHg. Medication adherence excellent.   Written patient instructions provided. Total time in  face to face counseling 50 minutes.   Follow up with PCP, Dr. Posey Pronto, in 4-6 weeks.  Patient seen with Gala Murdoch, PharmD Candidate, and Rebbeca Paul, PharmD - PGY2 Pharmacy Resident.     Phone call to patient  06/22/2021  14:49 Shared that labs were consistent with previous.  No significant change in lab report.  Patient verbalized understanding and appreciative of follow-up.

## 2021-06-21 NOTE — Assessment & Plan Note (Signed)
Diabetes longstanding currently uncontrolled. Patient is able to verbalize appropriate hypoglycemia management plan. Medication adherence appears good. Control is suboptimal due to unoptimized insulin regimen. -Decreased dose of basal insulin Tresiba (insulin degludec) to 14units daily. -Increased dose of  rapid insulin Novolog (insulin aspart) to 5units before lunch and 5-8units before dinner depending on carb intake.  -Continued GLP-1 Trulicity (generic name dulaglutide) 3mg  once weekly.  -Extensively discussed pathophysiology of diabetes, recommended lifestyle interventions, dietary effects on blood sugar control

## 2021-06-21 NOTE — Patient Instructions (Addendum)
It was nice to see you today!  Your goal blood sugar is 80-130 before eating and less than 180 after eating.  Medication Changes: DECREASE Tresiba to 14 units daily.   ADD Novolog 5 units before lunch.   CONTINUE Novolog 5-8 units before lunch (7-8 units if eating a meal higher in carbs like potatoes or rice)  Follow up with a provider in 4-6 weeks.   Monitor blood sugars at home and keep a log (glucometer or piece of paper) to bring with you to your next visit.  Keep up the good work with diet and exercise. Aim for a diet full of vegetables, fruit and lean meats (chicken, [place], fish). Try to limit salt intake by eating fresh or frozen vegetables (instead of canned), rinse canned vegetables prior to cooking and do not add any additional salt to meals.

## 2021-06-21 NOTE — Progress Notes (Signed)
Reviewed: I agree with Dr. Koval's documentation and management. 

## 2021-06-22 LAB — BASIC METABOLIC PANEL
BUN/Creatinine Ratio: 12 (ref 10–24)
BUN: 33 mg/dL — ABNORMAL HIGH (ref 8–27)
CO2: 26 mmol/L (ref 20–29)
Calcium: 10 mg/dL (ref 8.6–10.2)
Chloride: 99 mmol/L (ref 96–106)
Creatinine, Ser: 2.71 mg/dL — ABNORMAL HIGH (ref 0.76–1.27)
Glucose: 179 mg/dL — ABNORMAL HIGH (ref 70–99)
Potassium: 4.5 mmol/L (ref 3.5–5.2)
Sodium: 142 mmol/L (ref 134–144)
eGFR: 25 mL/min/{1.73_m2} — ABNORMAL LOW (ref >59)

## 2021-07-04 ENCOUNTER — Encounter: Payer: Self-pay | Admitting: Podiatry

## 2021-07-04 ENCOUNTER — Ambulatory Visit (INDEPENDENT_AMBULATORY_CARE_PROVIDER_SITE_OTHER): Payer: HMO | Admitting: Podiatry

## 2021-07-04 ENCOUNTER — Other Ambulatory Visit: Payer: Self-pay

## 2021-07-04 DIAGNOSIS — B351 Tinea unguium: Secondary | ICD-10-CM | POA: Diagnosis not present

## 2021-07-04 DIAGNOSIS — E119 Type 2 diabetes mellitus without complications: Secondary | ICD-10-CM | POA: Diagnosis not present

## 2021-07-04 DIAGNOSIS — D689 Coagulation defect, unspecified: Secondary | ICD-10-CM

## 2021-07-04 DIAGNOSIS — N184 Chronic kidney disease, stage 4 (severe): Secondary | ICD-10-CM

## 2021-07-04 DIAGNOSIS — M79609 Pain in unspecified limb: Secondary | ICD-10-CM

## 2021-07-04 NOTE — Progress Notes (Signed)
This patient returns to my office for at risk foot care.  This patient requires this care by a professional since this patient will be at risk due to having venous insufficiency, chronic kidney disease stage IV diabetes and coagulation defect.  Patient is taking eliquiss.  This patient is unable to cut nails himself since the patient cannot reach his nails.These nails are painful walking and wearing shoes.  This patient presents for at risk foot care today.  General Appearance  Alert, conversant and in no acute stress.  Vascular  Dorsalis pedis and posterior tibial  pulses are palpable  bilaterally.  Capillary return is within normal limits  bilaterally. Temperature is within normal limits  bilaterally.  Neurologic  Senn-Weinstein monofilament wire test within normal limits  bilaterally. Muscle power within normal limits bilaterally.  Nails Thick disfigured discolored nails with subungual debris  from hallux to fifth toes bilaterally. No evidence of bacterial infection or drainage bilaterally.  Orthopedic  No limitations of motion  feet .  No crepitus or effusions noted.  No bony pathology or digital deformities noted.  Amputation second toe left foot.  Skin  normotropic skin with no porokeratosis noted bilaterally.  No signs of infections or ulcers noted.  Porokeratosis sub 4 left and sub 5th right asymptomatic.  Onychomycosis  Pain in right toes  Pain in left toes  Consent was obtained for treatment procedures.   Mechanical debridement of nails 1-5  Right and 1,3-5 left foot. performed with a nail nipper.  Filed with dremel without incident.    Return office visit   9  weeks                   Told patient to return for periodic foot care and evaluation due to potential at risk complications.   Brittin Janik DPM 

## 2021-07-11 DIAGNOSIS — E1122 Type 2 diabetes mellitus with diabetic chronic kidney disease: Secondary | ICD-10-CM | POA: Diagnosis not present

## 2021-07-11 DIAGNOSIS — N189 Chronic kidney disease, unspecified: Secondary | ICD-10-CM | POA: Diagnosis not present

## 2021-07-11 DIAGNOSIS — N184 Chronic kidney disease, stage 4 (severe): Secondary | ICD-10-CM | POA: Diagnosis not present

## 2021-07-11 DIAGNOSIS — N2581 Secondary hyperparathyroidism of renal origin: Secondary | ICD-10-CM | POA: Diagnosis not present

## 2021-07-11 DIAGNOSIS — I129 Hypertensive chronic kidney disease with stage 1 through stage 4 chronic kidney disease, or unspecified chronic kidney disease: Secondary | ICD-10-CM | POA: Diagnosis not present

## 2021-07-11 DIAGNOSIS — D631 Anemia in chronic kidney disease: Secondary | ICD-10-CM | POA: Diagnosis not present

## 2021-07-19 ENCOUNTER — Ambulatory Visit (INDEPENDENT_AMBULATORY_CARE_PROVIDER_SITE_OTHER): Payer: HMO | Admitting: Family Medicine

## 2021-07-19 ENCOUNTER — Encounter: Payer: Self-pay | Admitting: Family Medicine

## 2021-07-19 ENCOUNTER — Other Ambulatory Visit: Payer: Self-pay

## 2021-07-19 DIAGNOSIS — E119 Type 2 diabetes mellitus without complications: Secondary | ICD-10-CM | POA: Diagnosis not present

## 2021-07-19 DIAGNOSIS — Z0000 Encounter for general adult medical examination without abnormal findings: Secondary | ICD-10-CM | POA: Diagnosis not present

## 2021-07-19 DIAGNOSIS — Z794 Long term (current) use of insulin: Secondary | ICD-10-CM | POA: Diagnosis not present

## 2021-07-19 MED ORDER — DICLOFENAC SODIUM 1 % EX GEL: 4.0000 g | Freq: Four times a day (QID) | CUTANEOUS | 0 refills | Status: DC

## 2021-07-19 NOTE — Assessment & Plan Note (Addendum)
CBGs in 190-300. Not many readings <100. No true hypoglycemic readings or symptoms. Precepted pt with Dr Valentina Lucks who recommended: -Continue Trulicity 3mg  weekly -Reduce Tresiba 12U -Continue Novolog 5-6 units before lunch, 5-6 units before dinner, counseled pt not use 3U anymore-needs higher dose.  -Ongoing diabetic education -F/u with Dr Valentina Lucks in 1 month -A1c due 2 months ago

## 2021-07-19 NOTE — Progress Notes (Addendum)
° ° ° °  SUBJECTIVE:   CHIEF COMPLAINT / HPI:   Anthony [name] is a 71 y.o. male presents for diabetic follow up    Diabetes Patient's current diabetic medications include tresiba 14 U, aspart 5 units before lunch, 5-8 units before dinner, Trulicity 3mg  weekly. Tolerating well without side effects.  Patient endorses compliance with these medications. CBG readings averaging in the 190-310 range.   Lab Results  Component Value Date   HGBA1C 8.7 (A) 06/21/2021   HGBA1C 9.1 (A) 12/27/2020   HGBA1C 8.6 (A) 09/22/2020  Denies abdominal pain, blurred vision, polyuria, polydipsia, hypoglycemia. Patient states they understand that diet and exercise can help with her diabetes. Reports he has held Novolog over the last few days or reduced it to 3U.   Last Microalbumin, LDL, Creatinine: Lab Results  Component Value Date   MICROALBUR 150 10/29/2017   LDLCALC 86 11/16/2020   CREATININE 2.71 (H) 06/21/2021    Flowsheet Row Office Visit from 07/19/2021 in [facility]  PHQ-9 Total Score 0         PERTINENT  PMH / PSH: DM, CAD, HTN   OBJECTIVE:   BP 112/63    Pulse 85    Ht 6' (1.829 m)    Wt 219 lb 9.6 oz (99.6 kg)    SpO2 99%    BMI 29.78 kg/m    General: Alert, no acute distress, pleasant  Cardio: well perfused  Pulm: normal work of breathing Neuro: Cranial nerves grossly intact   ASSESSMENT/PLAN:   Insulin dependent type 2 diabetes mellitus (HCC) CBGs in 190-300. Not many readings <100. No true hypoglycemic readings or symptoms. Precepted pt with Dr Valentina Lucks who recommended: -Continue Trulicity 3mg  weekly -Reduce Tresiba 12U -Continue Novolog 5-6 units before lunch, 5-6 units before dinner, counseled pt not use 3U anymore-needs higher dose.  -Ongoing diabetic education -F/u with Dr Valentina Lucks in 1 month -A1c due 2 months ago  Healthcare maintenance Recommended shingles vaccine.     Lattie Haw, MD PGY-3 [place]

## 2021-07-19 NOTE — Patient Instructions (Addendum)
Thank you for coming to see me today. It was a pleasure. Today we discussed your blood sugars, overall they are stable but we working o getting them lower. I recommend:  -Continue Trulicity 3mg  -Reduce Tresiba 12units  -Continue Novolog 5 units before lunch, 5-6 units before dinner  Can increse tylenol 650mg  to three time a day   Please follow-up with Dr Valentina Lucks in 4 weeks   If you have any questions or concerns, please do not hesitate to call the office at [phone].  Best wishes,   Dr Barrie Dunker:

## 2021-07-19 NOTE — Assessment & Plan Note (Signed)
Recommended shingles vaccine.

## 2021-07-22 NOTE — Progress Notes (Deleted)
Cardiology Office Note:    Date:  07/22/2021   ID:  Anthony [name], Anthony [name] 1951/01/26, MRN [id number]  PCP:  Zenia Resides, MD   [facility] Providers Cardiologist:  Freada Bergeron, MD Electrophysiologist:  Virl Axe, MD {     Referring MD: Lattie Haw, MD     History of Present Illness:    Anthony [name] is a 71 y.o. male with a hx of atrial fibrillation, AV ablation 07/2017 with PM/ICD placement, CAD s/p PCI in 2013 of circumflex lesion extending into second obtuse marginal branch with DES, hypertension, CKD 4, chronic systolic and diastolic heart failure, OSA who was previously followed by Dr. Meda Coffee who presents to clinic for follow-up.  Previous cardiac catheterization in 2013 with DES to circumflex extending into second obtuse marginal branch. Myoview 2007 with no ischemia.  Echocardiogram 2018 LVEF 25 to 30% improved in 2019 to 55-60%.  Previous testing for amyloid with negative PYP.  Seen by Dr. Meda Coffee 06/2020 with no edema, orthopnea, PND.  No changes were made at that time. Hospitalized 01/2021 with leg weakness which normalized and neurology was consulted.     He saw Dr. Caryl Comes 03/26/2021 noted marked dyspnea on exertion.  He was compliant with CPAP.  His same regimen was continued.  He was seen 04/16/2021.  Noted to have elevated OptiVol level that morning by ICD check.  He noted dyspnea and lower extremity edema but no orthopnea nor PND.  He was recommended to start Lasix 40 mg daily as well as potassium added.  He was seen by Laurann Montana on 05/10/21 where he was feeling significantly improved with no HF symptoms.  Past Medical History:  Diagnosis Date   Arthritis    Benign neoplasm of descending colon    Benign neoplasm of sigmoid colon    Benign neoplasm of transverse colon    CAD in native artery    a. reported MI 2000, 2001, s/p stenting of the circumflex lesion extending into the second obtuse marginal branch with a drug-eluting stent in 2003. b. low  risk nuc 2015.   Chronic atrial fibrillation (HCC)    Chronic combined systolic and diastolic CHF (congestive heart failure) ([place])    a. Previously diastolic, then EF [id number] in 10/2016.   CKD (chronic kidney disease), stage III (HCC)    Depression    Diabetes mellitus    Edema of extremities    Erectile dysfunction    GERD (gastroesophageal reflux disease)    Hyperlipidemia    Hypertension    Myocardial infarction ([place]) 2000&2001   Obesity    Onychomycosis    OSA (obstructive sleep apnea)    S/P ICD (internal cardiac defibrillator) procedure and BiV device,  07/25/17 [employer]  07/26/2017   Seizures ([place])    Sleep apnea    Stroke ([place])    Tubular adenoma of colon 10/2002    Past Surgical History:  Procedure Laterality Date   ANGIOPLASTY  2001   stent x 1   AV NODE ABLATION N/A 08/20/2017   Procedure: AV NODE ABLATION;  Surgeon: Deboraha Sprang, MD;  Location: [place] [facility];  Service: Cardiovascular;  Laterality: N/A;   BIV ICD INSERTION CRT-D N/A 07/25/2017   Procedure: BIV ICD INSERTION CRT-D;  Surgeon: Deboraha Sprang, MD;  Location: [place] [facility];  Service: Cardiovascular;  Laterality: N/A;   COLONOSCOPY  2000?   negative   COLONOSCOPY WITH PROPOFOL N/A 02/08/2019   Procedure: COLONOSCOPY WITH PROPOFOL;  Surgeon: Fuller Plan,  Pricilla Riffle, MD;  Location: [facility];  Service: Endoscopy;  Laterality: N/A;   FOOT ARTHROTOMY Right    POLYPECTOMY  02/08/2019   Procedure: POLYPECTOMY;  Surgeon: Ladene Artist, MD;  Location: [facility];  Service: Endoscopy;;   TOE AMPUTATION Left 2012    Current Medications: No outpatient medications have been marked as taking for the 07/27/21 encounter (Appointment) with Freada Bergeron, MD.     Allergies:   Enalapril maleate   Social History   Socioeconomic History   Marital status: Married    Spouse name: [name]   Number of children: 2   Years of education: GED   Highest education level: Not on file  Occupational History    Occupation: CUSTOMER SERVICES    Employer: [employer]    Comment: [employer]  Tobacco Use   Smoking status: Former    Packs/day: 0.25    Years: 45.00    Pack years: 11.25    Types: Cigarettes    Start date: 05/28/1967    Quit date: 10/28/2018    Years since quitting: 2.7   Smokeless tobacco: Never  Vaping Use   Vaping Use: Never used  Substance and Sexual Activity   Alcohol use: No    Comment: quit in 1993   Drug use: No   Sexual activity: Not on file  Other Topics Concern   Not on file  Social History Narrative   Patient is right handed, consumes caffeine rarely. Patient resides in home with wife.   Social Determinants of Health   Financial Resource Strain: Not on file  Food Insecurity: Not on file  Transportation Needs: Not on file  Physical Activity: Not on file  Stress: Not on file  Social Connections: Not on file     Family History: The patient's ***family history includes CVA in his mother; Diabetes in his mother; Heart attack in his father and mother. There is no history of Colon cancer or Stomach cancer.  ROS:   Please see the history of present illness.    *** All other systems reviewed and are negative.  EKGs/Labs/Other Studies Reviewed:    The following studies were reviewed today: Carotid duplex 01/2021 Right Carotid: The extracranial vessels were near-normal with only minimal  wall                thickening or plaque.   Left Carotid: The extracranial vessels were near-normal with only minimal  wall               thickening or plaque.   Vertebrals:  Bilateral vertebral arteries demonstrate antegrade flow.  Subclavians: Normal flow hemodynamics were seen in bilateral subclavian               arteries.    Echo 01/2021  1. Left ventricular ejection fraction, by estimation, is 60 to 65%. The  left ventricle has normal function. The left ventricle has no regional  wall motion abnormalities. There is mild left ventricular hypertrophy.  Left ventricular diastolic  parameters  are indeterminate.   2. Pacing wires in RA/RV. Right ventricular systolic function is normal.  The right ventricular size is normal.   3. Left atrial size was moderately dilated.   4. The mitral valve is abnormal. Trivial mitral valve regurgitation. No  evidence of mitral stenosis.   5. The aortic valve was not well visualized. There is mild calcification  of the aortic valve. Aortic valve regurgitation is not visualized. Mild  aortic valve sclerosis is present, with no evidence  of aortic valve  stenosis.   6. The inferior vena cava is normal in size with greater than 50%  respiratory variability, suggesting right atrial pressure of 3 mmHg.   EKG:  EKG is *** ordered today.  The ekg ordered today demonstrates ***  Recent Labs: 02/16/2021: ALT 21 02/17/2021: TSH 0.935 04/16/2021: BNP 297.1; Hemoglobin 13.7; Platelets 118 06/21/2021: BUN 33; Creatinine, Ser 2.71; Potassium 4.5; Sodium 142  Recent Lipid Panel    Component Value Date/Time   CHOL 139 11/16/2020 0954   TRIG 92 11/16/2020 0954   HDL 35 (L) 11/16/2020 0954   CHOLHDL 4.0 11/16/2020 0954   CHOLHDL 3.2 02/28/2016 0906   VLDL 19 02/28/2016 0906   LDLCALC 86 11/16/2020 0954   LDLDIRECT 58 11/06/2011 1434     Risk Assessment/Calculations:   {Does this patient have ATRIAL FIBRILLATION?[phone]}       Physical Exam:    VS:  There were no vitals taken for this visit.    Wt Readings from Last 3 Encounters:  07/19/21 219 lb 9.6 oz (99.6 kg)  06/21/21 223 lb (101.2 kg)  05/29/21 226 lb 3.2 oz (102.6 kg)     GEN: *** Well nourished, well developed in no acute distress HEENT: Normal NECK: No JVD; No carotid bruits LYMPHATICS: No lymphadenopathy CARDIAC: ***RRR, no murmurs, rubs, gallops RESPIRATORY:  Clear to auscultation without rales, wheezing or rhonchi  ABDOMEN: Soft, non-tender, non-distended MUSCULOSKELETAL:  No edema; No deformity  SKIN: Warm and dry NEUROLOGIC:  Alert and oriented x  3 PSYCHIATRIC:  Normal affect   ASSESSMENT:    No diagnosis found. PLAN:    In order of problems listed above:  #CAD s/p to PCI Lcx: Doing well without anginal symptoms. -Continue crestor '20mg'$  daily -Continue losartan '25mg'$  daily -Continue coreg '25mg'$  BID  #Chronic HFpEF: #Nonischemic CM: LVEF 25-30% in 2018 improved to 55-60% in 2019. Myoview 2007 with no ischemia. He was evaluated for possible cardiac amyloidosis with negative PYP scan. Doing well and euvolemic on exam.  -Continue lasix '40mg'$  daily -Continue losartan '25mg'$  daily -Continue coreg '25mg'$  BID -Continue hydralazine '100mg'$  TID  #Permanent Afib s/p AVN ablation: CHADS-vasc 5. Followed by Dr. Caryl Comes. Tolerating AC without issues.  -Continue apixaban '5mg'$  BID -Continue coreg '25mg'$  BID  #Severe LVH: PYP scan negative for amyloid. -Continue BP medications  #HTN: -Continue losartan '25mg'$  daily -Continue coreg '25mg'$  BID -Continue hydralazine '100mg'$  TID  #HLD: -Continue crestor '20mg'$  daily -Repeat lipids today -If LDL not at goal <70, will add zetia '10mg'$  daily  #DMII on Insulin: -Management per PCP      {Are you ordering a CV Procedure (e.g. stress test, cath, DCCV, TEE, etc)?   Press F2        :[id number]    Medication Adjustments/Labs and Tests Ordered: Current medicines are reviewed at length with the patient today.  Concerns regarding medicines are outlined above.  No orders of the defined types were placed in this encounter.  No orders of the defined types were placed in this encounter.   There are no Patient Instructions on file for this visit.   Signed, Freada Bergeron, MD  07/22/2021 8:10 PM    [place]

## 2021-07-23 ENCOUNTER — Telehealth: Payer: Self-pay

## 2021-07-23 NOTE — Telephone Encounter (Signed)
Left hippa complaint message stating one of the medications called in to his pharmacy on 07/19/21 is an OTC medication not covered by his insurance.  Left call back number [phone]

## 2021-07-27 ENCOUNTER — Encounter: Payer: Self-pay | Admitting: Cardiology

## 2021-07-27 ENCOUNTER — Other Ambulatory Visit: Payer: Self-pay

## 2021-07-27 ENCOUNTER — Ambulatory Visit (INDEPENDENT_AMBULATORY_CARE_PROVIDER_SITE_OTHER): Payer: HMO | Admitting: Cardiology

## 2021-07-27 VITALS — BP 128/72 | HR 84 | Ht 72.0 in | Wt 224.0 lb

## 2021-07-27 DIAGNOSIS — Z95810 Presence of automatic (implantable) cardiac defibrillator: Secondary | ICD-10-CM | POA: Diagnosis not present

## 2021-07-27 DIAGNOSIS — E119 Type 2 diabetes mellitus without complications: Secondary | ICD-10-CM

## 2021-07-27 DIAGNOSIS — I2510 Atherosclerotic heart disease of native coronary artery without angina pectoris: Secondary | ICD-10-CM

## 2021-07-27 DIAGNOSIS — N184 Chronic kidney disease, stage 4 (severe): Secondary | ICD-10-CM | POA: Diagnosis not present

## 2021-07-27 DIAGNOSIS — I428 Other cardiomyopathies: Secondary | ICD-10-CM | POA: Diagnosis not present

## 2021-07-27 DIAGNOSIS — Z79899 Other long term (current) drug therapy: Secondary | ICD-10-CM

## 2021-07-27 DIAGNOSIS — I10 Essential (primary) hypertension: Secondary | ICD-10-CM | POA: Diagnosis not present

## 2021-07-27 DIAGNOSIS — I4821 Permanent atrial fibrillation: Secondary | ICD-10-CM | POA: Diagnosis not present

## 2021-07-27 DIAGNOSIS — E782 Mixed hyperlipidemia: Secondary | ICD-10-CM

## 2021-07-27 DIAGNOSIS — D6859 Other primary thrombophilia: Secondary | ICD-10-CM

## 2021-07-27 DIAGNOSIS — I5042 Chronic combined systolic (congestive) and diastolic (congestive) heart failure: Secondary | ICD-10-CM | POA: Diagnosis not present

## 2021-07-27 LAB — LIPID PANEL
Chol/HDL Ratio: 3.3 ratio (ref 0.0–5.0)
Cholesterol, Total: 128 mg/dL (ref 100–199)
HDL: 39 mg/dL — ABNORMAL LOW (ref >39)
LDL Chol Calc (NIH): 70 mg/dL (ref 0–99)
Triglycerides: 103 mg/dL (ref 0–149)
VLDL Cholesterol Cal: 19 mg/dL (ref 5–40)

## 2021-07-27 NOTE — Progress Notes (Signed)
Cardiology Office Note:    Date:  07/27/2021   ID:  Anthony [name], Anthony [name] 1951/01/22, MRN [id number]  PCP:  Zenia Resides, MD   [facility] Providers Cardiologist:  Freada Bergeron, MD Electrophysiologist:  Virl Axe, MD  { Referring MD: Lattie Haw, MD   History of Present Illness:    Anthony [name] is a 71 y.o. male with a hx of atrial fibrillation, AV ablation 07/2017 with PM/ICD placement, CAD s/p PCI in 2013 of circumflex lesion extending into second obtuse marginal branch with DES, hypertension, CKD 4, chronic systolic and diastolic heart failure, OSA who was previously followed by Dr. Meda Coffee who presents to clinic for follow-up.  Previous cardiac catheterization in 2013 with DES to circumflex extending into second obtuse marginal branch. Myoview 2007 with no ischemia.  Echocardiogram 2018 LVEF 25 to 30% improved in 2019 to 55-60%.  Previous testing for amyloid with negative PYP.  Seen by Dr. Meda Coffee 06/2020 with no edema, orthopnea, PND.  No changes were made at that time.  Hospitalized 01/2021 with leg weakness which normalized and neurology was consulted.     He saw Dr. Caryl Comes 03/26/2021 noted marked dyspnea on exertion.  He was compliant with CPAP.  His same regimen was continued.  He was seen 04/16/2021.  Noted to have elevated OptiVol level that morning by ICD check.  He noted dyspnea and lower extremity edema but no orthopnea nor PND.  He was recommended to start Lasix 40 mg daily as well as potassium.  He was seen by Laurann Montana on 05/10/21 where he was feeling significantly improved with no HF symptoms.  Today, the patient states that he is feeling okay overall. His breathing has been good lately, and he denies any edema. He is compliant with Lasix which seems to have helped.  In clinic today his BP is 128/72. At home he typically has similar readings.  He reports that he needs a refill of Eliquis, but it has also become cost-prohibitive. He has not missed any  doses and denies any bleeding issues.  He denies any palpitations, chest pain. No lightheadedness, headaches, syncope, orthopnea, or PND. No ICD shocks.  His diabetes is followed by his PCP.  Past Medical History:  Diagnosis Date   Arthritis    Benign neoplasm of descending colon    Benign neoplasm of sigmoid colon    Benign neoplasm of transverse colon    CAD in native artery    a. reported MI 2000, 2001, s/p stenting of the circumflex lesion extending into the second obtuse marginal branch with a drug-eluting stent in 2003. b. low risk nuc 2015.   Chronic atrial fibrillation (HCC)    Chronic combined systolic and diastolic CHF (congestive heart failure) ([place])    a. Previously diastolic, then EF [id number] in 10/2016.   CKD (chronic kidney disease), stage III (HCC)    Depression    Diabetes mellitus    Edema of extremities    Erectile dysfunction    GERD (gastroesophageal reflux disease)    Hyperlipidemia    Hypertension    Myocardial infarction ([place]) 2000&2001   Obesity    Onychomycosis    OSA (obstructive sleep apnea)    S/P ICD (internal cardiac defibrillator) procedure and BiV device,  07/25/17 Medtronic  07/26/2017   Seizures ([place])    Sleep apnea    Stroke [facility])    Tubular adenoma of colon 10/2002    Past Surgical History:  Procedure Laterality Date   ANGIOPLASTY  2001   stent x 1   AV NODE ABLATION N/A 08/20/2017   Procedure: AV NODE ABLATION;  Surgeon: Deboraha Sprang, MD;  Location: [place] [facility];  Service: Cardiovascular;  Laterality: N/A;   BIV ICD INSERTION CRT-D N/A 07/25/2017   Procedure: BIV ICD INSERTION CRT-D;  Surgeon: Deboraha Sprang, MD;  Location: [place] [facility];  Service: Cardiovascular;  Laterality: N/A;   COLONOSCOPY  2000?   negative   COLONOSCOPY WITH PROPOFOL N/A 02/08/2019   Procedure: COLONOSCOPY WITH PROPOFOL;  Surgeon: Ladene Artist, MD;  Location: [facility];  Service: Endoscopy;  Laterality: N/A;   FOOT ARTHROTOMY Right    POLYPECTOMY   02/08/2019   Procedure: POLYPECTOMY;  Surgeon: Ladene Artist, MD;  Location: [facility];  Service: Endoscopy;;   TOE AMPUTATION Left 2012    Current Medications: Current Meds  Medication Sig   acetaminophen (TYLENOL) 650 MG CR tablet Take 1,300 mg by mouth 2 (two) times daily as needed for pain.   apixaban (ELIQUIS) 5 MG TABS tablet Take 1 tablet (5 mg total) by mouth 2 (two) times daily.   calcitRIOL (ROCALTROL) 0.5 MCG capsule Take 0.5 mcg by mouth daily at 12 noon.    carvedilol (COREG) 25 MG tablet Take 1 tablet (25 mg total) by mouth 2 (two) times daily. Please make appt with Dr. Johney Frame (Cardiologist) before anymore refills. Thank you 1st attempt   Continuous Blood Gluc Receiver (FREESTYLE LIBRE 14 DAY READER) DEVI Apply topically as directed.   Continuous Blood Gluc Sensor (FREESTYLE LIBRE 14 DAY SENSOR) MISC 1 Device by Does not apply route every 14 (fourteen) days.   diclofenac Sodium (VOLTAREN) 1 % GEL Apply 4 g topically 4 (four) times daily.   Dulaglutide (TRULICITY) 1.5 0000000 SOPN Inject 3 mg into the skin once a week.   furosemide (LASIX) 40 MG tablet Take 1 tablet (40 mg total) by mouth daily.   gabapentin (NEURONTIN) 300 MG capsule Take 1 capsule (300 mg total) by mouth at bedtime.   glucose blood (ONETOUCH VERIO) test strip Use as instructed   hydrALAZINE (APRESOLINE) 100 MG tablet Take 1 tablet (100 mg total) by mouth 3 (three) times daily.   insulin aspart (NOVOLOG FLEXPEN) 100 UNIT/ML FlexPen Inject 5-8 Units into the skin 2 (two) times daily before lunch and supper. Inject 5 units before lunch and 5-8 units before dinner.   insulin degludec (TRESIBA FLEXTOUCH) 200 UNIT/ML FlexTouch Pen Inject 14 Units into the skin daily.   Insulin Pen Needle (PEN NEEDLES) 32G X 4 MM MISC 1 Device by Does not apply route as directed.   Lancets (ONETOUCH ULTRASOFT) lancets Use as instructed   loratadine (CLARITIN) 10 MG tablet Take 10 mg by mouth daily as needed for allergies.    losartan (COZAAR) 25 MG tablet Take 1 tablet (25 mg total) by mouth daily.   Multiple Vitamin (MULTIVITAMIN) tablet Take 1 tablet by mouth daily.     nitroGLYCERIN (NITROSTAT) 0.4 MG SL tablet Place 1 tablet (0.4 mg total) under the tongue every 5 (five) minutes as needed for chest pain. If no relief by 3rd tab, call 911   omeprazole (PRILOSEC) 40 MG capsule TAKE ONE CAPSULE BY MOUTH DAILY BEFORE SUPPER   oxymorphone (OPANA) 5 MG tablet Take 10-15 mg by mouth daily. Per Dr Brien Few   PARoxetine (PAXIL) 30 MG tablet Take 1 tablet (30 mg total) by mouth daily.   rosuvastatin (CRESTOR) 20 MG tablet TAKE 1 TABLET BY MOUTH EVERY DAY  tamsulosin (FLOMAX) 0.4 MG CAPS capsule TAKE 1 CAPSULE BY MOUTH EVERYDAY AT BEDTIME     Allergies:   Enalapril maleate   Social History   Socioeconomic History   Marital status: Married    Spouse name: [name]   Number of children: 2   Years of education: GED   Highest education level: Not on file  Occupational History   Occupation: CUSTOMER SERVICES    Employer: [employer]    Comment: [employer]  Tobacco Use   Smoking status: Former    Packs/day: 0.25    Years: 45.00    Pack years: 11.25    Types: Cigarettes    Start date: 05/28/1967    Quit date: 10/28/2018    Years since quitting: 2.7   Smokeless tobacco: Never  Vaping Use   Vaping Use: Never used  Substance and Sexual Activity   Alcohol use: No    Comment: quit in 1993   Drug use: No   Sexual activity: Not on file  Other Topics Concern   Not on file  Social History Narrative   Patient is right handed, consumes caffeine rarely. Patient resides in home with wife.   Social Determinants of Health   Financial Resource Strain: Not on file  Food Insecurity: Not on file  Transportation Needs: Not on file  Physical Activity: Not on file  Stress: Not on file  Social Connections: Not on file     Family History: The patient's family history includes CVA in his mother; Diabetes in his mother; Heart attack in  his father and mother. There is no history of Colon cancer or Stomach cancer.  ROS:   Review of Systems  Constitutional:  Negative for chills and fever.  HENT:  Negative for nosebleeds.   Eyes:  Negative for double vision and pain.  Respiratory:  Negative for cough and shortness of breath.   Gastrointestinal:  Negative for constipation and vomiting.  Genitourinary:  Negative for frequency.  Musculoskeletal:  Negative for falls.  Neurological:  Negative for dizziness and loss of consciousness.  Endo/Heme/Allergies:  Negative for polydipsia.  Psychiatric/Behavioral:  Negative for depression. The patient does not have insomnia.     EKGs/Labs/Other Studies Reviewed:    The following studies were reviewed today:  Carotid duplex 01/2021 Right Carotid: The extracranial vessels were near-normal with only minimal wall thickening or plaque.   Left Carotid: The extracranial vessels were near-normal with only minimal  wall thickening or plaque.   Vertebrals:  Bilateral vertebral arteries demonstrate antegrade flow.  Subclavians: Normal flow hemodynamics were seen in bilateral subclavian arteries.    Echo 01/2021  1. Left ventricular ejection fraction, by estimation, is 60 to 65%. The  left ventricle has normal function. The left ventricle has no regional  wall motion abnormalities. There is mild left ventricular hypertrophy.  Left ventricular diastolic parameters are indeterminate.   2. Pacing wires in RA/RV. Right ventricular systolic function is normal.  The right ventricular size is normal.   3. Left atrial size was moderately dilated.   4. The mitral valve is abnormal. Trivial mitral valve regurgitation. No  evidence of mitral stenosis.   5. The aortic valve was not well visualized. There is mild calcification  of the aortic valve. Aortic valve regurgitation is not visualized. Mild  aortic valve sclerosis is present, with no evidence of aortic valve  stenosis.   6. The inferior vena  cava is normal in size with greater than 50%  respiratory variability, suggesting right atrial pressure of 3  mmHg.   Lexiscan Myoview 03/17/2020: The left ventricular ejection fraction is mildly decreased (45-54%). There was no ST segment deviation noted during stress. The study is normal. This is a low risk study. Nuclear stress EF: 53%.   No ischemia or infarction on perfusion images.   EKG:  EKG is personally reviewed.  07/27/2021: EKG was not ordered. 04/16/2021: Ventricular paced rhythm, rate 71 bpm.  Recent Labs: 02/16/2021: ALT 21 02/17/2021: TSH 0.935 04/16/2021: BNP 297.1; Hemoglobin 13.7; Platelets 118 06/21/2021: BUN 33; Creatinine, Ser 2.71; Potassium 4.5; Sodium 142   Recent Lipid Panel    Component Value Date/Time   CHOL 139 11/16/2020 0954   TRIG 92 11/16/2020 0954   HDL 35 (L) 11/16/2020 0954   CHOLHDL 4.0 11/16/2020 0954   CHOLHDL 3.2 02/28/2016 0906   VLDL 19 02/28/2016 0906   LDLCALC 86 11/16/2020 0954   LDLDIRECT 58 11/06/2011 1434     Risk Assessment/Calculations:           Physical Exam:    VS:  BP 128/72   Pulse 84   Ht 6' (1.829 m)   Wt 224 lb (101.6 kg)   SpO2 98%   BMI 30.38 kg/m     Wt Readings from Last 3 Encounters:  07/27/21 224 lb (101.6 kg)  07/19/21 219 lb 9.6 oz (99.6 kg)  06/21/21 223 lb (101.2 kg)     GEN: Well nourished, well developed in no acute distress HEENT: Normal NECK: No JVD; No carotid bruits CARDIAC: RRR, no murmurs, rubs, gallops RESPIRATORY:  Clear to auscultation without rales, wheezing or rhonchi  ABDOMEN: Soft, non-tender, non-distended MUSCULOSKELETAL:  No edema; No deformity  SKIN: Warm and dry NEUROLOGIC:  Alert and oriented x 3 PSYCHIATRIC:  Normal affect   ASSESSMENT:    1. Coronary artery disease involving native coronary artery of native heart without angina pectoris   2. Mixed hyperlipidemia   3. Medication management   4. S/P ICD (internal cardiac defibrillator) procedure and BiV device,   07/25/17 Medtronic    5. Essential hypertension   6. Chronic combined systolic and diastolic heart failure ([place])   7. Primary hypertension   8. CKD (chronic kidney disease) stage 4, GFR 15-29 ml/min (HCC)   9. Hypercoagulable state ([place])   10. NICM (nonischemic cardiomyopathy) ([place])   11. Atrial fibrillation, permanent ([place])   12. Diabetes mellitus with coincident hypertension ([place])    PLAN:    In order of problems listed above:  #CAD s/p to PCI Lcx: Doing well without anginal symptoms. -Continue crestor '20mg'$  daily -Continue losartan '25mg'$  daily -Continue coreg '25mg'$  BID  #Chronic HFpEF: #Nonischemic CM: LVEF 25-30% in 2018 improved to 55-60% in 2019. Myoview 2007 with no ischemia. He was evaluated for possible cardiac amyloidosis with negative PYP scan. Doing well and euvolemic on exam.  -Continue lasix '40mg'$  daily -Continue losartan '25mg'$  daily -Continue coreg '25mg'$  BID -Continue hydralazine '100mg'$  TID  #Permanent Afib s/p AVN ablation: #Hypercoaguable state: CHADS-vasc 5. Followed by Dr. Caryl Comes. Tolerating AC without issues.  -Continue apixaban '5mg'$  BID -Continue coreg '25mg'$  BID  #Severe LVH: PYP scan negative for amyloid. -Continue BP medications  #HTN: Well controlled and at goal <120/80s. -Continue losartan '25mg'$  daily -Continue coreg '25mg'$  BID -Continue hydralazine '100mg'$  TID  #HLD: -Continue crestor '20mg'$  daily -Repeat lipids today -If LDL not at goal <70, will add zetia '10mg'$  daily  #DMII on Insulin: -Management per PCP -A1C above goal at 8         Follow-up:   6  months.  Medication Adjustments/Labs and Tests Ordered: Current medicines are reviewed at length with the patient today.  Concerns regarding medicines are outlined above.   Orders Placed This Encounter  Procedures   Lipid Profile   No orders of the defined types were placed in this encounter.  Patient Instructions  Medication Instructions:   Your physician recommends that you continue on your  current medications as directed. Please refer to the Current Medication list given to you today.  *If you need a refill on your cardiac medications before your next appointment, please call your pharmacy*   Lab Work:  TODAY--LIPIDS  If you have labs (blood work) drawn today and your tests are completely normal, you will receive your results only by: [place] (if you have MyChart) OR A paper copy in the mail If you have any lab test that is abnormal or we need to change your treatment, we will call you to review the results.   Follow-Up: At [facility], you and your health needs are our priority.  As part of our continuing mission to provide you with exceptional heart care, we have created designated Provider Care Teams.  These Care Teams include your primary Cardiologist (physician) and Advanced Practice Providers (APPs -  Physician Assistants and Nurse Practitioners) who all work together to provide you with the care you need, when you need it.  We recommend signing up for the patient portal called "MyChart".  Sign up information is provided on this After Visit Summary.  MyChart is used to connect with patients for Virtual Visits (Telemedicine).  Patients are able to view lab/test results, encounter notes, upcoming appointments, etc.  Non-urgent messages can be sent to your provider as well.   To learn more about what you can do with MyChart, go to [email or web address].    Your next appointment:   6 month(s)  The format for your next appointment:   In Person  Provider:   Freada Bergeron, MD {       [facility] Stumpf,acting as a scribe for Freada Bergeron, MD.,have documented all relevant documentation on the behalf of Freada Bergeron, MD,as directed by  Freada Bergeron, MD while in the presence of Freada Bergeron, MD.  I, Freada Bergeron, MD, have reviewed all documentation for this visit. The documentation on 07/27/21 for the exam, diagnosis,  procedures, and orders are all accurate and complete.   Signed, Freada Bergeron, MD  07/27/2021 9:46 AM    [place]

## 2021-07-27 NOTE — Patient Instructions (Signed)
Medication Instructions:  ? ?Your physician recommends that you continue on your current medications as directed. Please refer to the Current Medication list given to you today. ? ?*If you need a refill on your cardiac medications before your next appointment, please call your pharmacy* ? ? ?Lab Work: ? ?TODAY--LIPIDS ? ?If you have labs (blood work) drawn today and your tests are completely normal, you will receive your results only by: ?MyChart Message (if you have MyChart) OR ?A paper copy in the mail ?If you have any lab test that is abnormal or we need to change your treatment, we will call you to review the results. ? ? ?Follow-Up: ?At [facility], you and your health needs are our priority.  As part of our continuing mission to provide you with exceptional heart care, we have created designated Provider Care Teams.  These Care Teams include your primary Cardiologist (physician) and Advanced Practice Providers (APPs -  Physician Assistants and Nurse Practitioners) who all work together to provide you with the care you need, when you need it. ? ?We recommend signing up for the patient portal called "MyChart".  Sign up information is provided on this After Visit Summary.  MyChart is used to connect with patients for Virtual Visits (Telemedicine).  Patients are able to view lab/test results, encounter notes, upcoming appointments, etc.  Non-urgent messages can be sent to your provider as well.   ?To learn more about what you can do with MyChart, go to [email or web address].   ? ?Your next appointment:   ?6 month(s) ? ?The format for your next appointment:   ?In Person ? ?Provider:   ?Freada Bergeron, MD { ? ? ? ? ?

## 2021-07-30 ENCOUNTER — Telehealth: Payer: Self-pay | Admitting: Cardiology

## 2021-07-30 NOTE — Telephone Encounter (Signed)
The patient has been notified of the result and verbalized understanding.  All questions (if any) were answered. Nuala Alpha, LPN 0/01/8118 1:47 AM  ? ?

## 2021-07-30 NOTE — Telephone Encounter (Signed)
-----   Message from Freada Bergeron, MD sent at 07/29/2021  3:33 PM EST ----- ?His LDL cholesterol is right at goal of 70. No changes in medications. ?

## 2021-07-30 NOTE — Telephone Encounter (Signed)
Patient returning call to get lab results  ?

## 2021-08-03 NOTE — Progress Notes (Signed)
     SUBJECTIVE:   CHIEF COMPLAINT / HPI:   [name] is a 71 y.o. male presents for back pain  Back and hip pain Now resolved with voltaren gel.  Patient is now asymptomatic  BPH Pt would like PSA take today. Takes flomax 0.34m, tolerating well without side effects.  Patient reports improvement in nocturia symptoms since starting Flomax.  FMariannaOffice Visit from 08/06/2021 in [place] PHQ-9 Total Score 0       PERTINENT  PMH / PSH: Type 2 diabetes, CHF, hypertension  OBJECTIVE:   BP 122/78   Pulse 77   Ht 6' (1.829 m)   Wt 223 lb 6.4 oz (101.3 kg)   SpO2 99%   BMI 30.30 kg/m    General: Alert, no acute distress Cardio: Well-perfused Pulm: normal work of breathing Neuro: Cranial nerves grossly intact   ASSESSMENT/PLAN:   BPH (benign prostatic hyperplasia) PSA 1.2, stable from previous. Continue tamsulosin.    PLattie Haw MD PGY-3 [place]

## 2021-08-06 ENCOUNTER — Other Ambulatory Visit: Payer: Self-pay

## 2021-08-06 ENCOUNTER — Encounter: Payer: Self-pay | Admitting: Family Medicine

## 2021-08-06 ENCOUNTER — Ambulatory Visit (INDEPENDENT_AMBULATORY_CARE_PROVIDER_SITE_OTHER): Payer: HMO | Admitting: Family Medicine

## 2021-08-06 VITALS — BP 122/78 | HR 77 | Ht 72.0 in | Wt 223.4 lb

## 2021-08-06 DIAGNOSIS — N401 Enlarged prostate with lower urinary tract symptoms: Secondary | ICD-10-CM

## 2021-08-06 DIAGNOSIS — N400 Enlarged prostate without lower urinary tract symptoms: Secondary | ICD-10-CM | POA: Diagnosis not present

## 2021-08-06 NOTE — Patient Instructions (Signed)
Thank you for coming to see me today. It was a pleasure. Today we discussed your prostate symptoms. We did a blood test today to check the growth of the prostate. Continue flomax. ? ?Continue the voltaren gel for the back and hip pain. ? ?If you have any questions or concerns, please do not hesitate to call the office at [phone]. ? ?Best wishes,  ? ?Dr Posey Pronto   ?

## 2021-08-07 ENCOUNTER — Ambulatory Visit (INDEPENDENT_AMBULATORY_CARE_PROVIDER_SITE_OTHER): Payer: HMO

## 2021-08-07 DIAGNOSIS — I5032 Chronic diastolic (congestive) heart failure: Secondary | ICD-10-CM

## 2021-08-07 LAB — PSA: Prostate Specific Ag, Serum: 1.2 ng/mL (ref 0.0–4.0)

## 2021-08-08 ENCOUNTER — Telehealth: Payer: Self-pay

## 2021-08-08 LAB — CUP PACEART REMOTE DEVICE CHECK
Battery Remaining Longevity: 11 mo
Battery Voltage: 2.86 V
Brady Statistic AP VP Percent: 0 %
Brady Statistic AP VS Percent: 0 %
Brady Statistic AS VP Percent: 0 %
Brady Statistic AS VS Percent: 0 %
Brady Statistic RA Percent Paced: 0 %
Brady Statistic RV Percent Paced: 98.37 %
Date Time Interrogation Session: 20230315114745
HighPow Impedance: 63 Ohm
Implantable Lead Implant Date: 20190301
Implantable Lead Implant Date: 20190301
Implantable Lead Location: 753858
Implantable Lead Location: 753860
Implantable Pulse Generator Implant Date: 20190301
Lead Channel Impedance Value: 1026 Ohm
Lead Channel Impedance Value: 1083 Ohm
Lead Channel Impedance Value: 1083 Ohm
Lead Channel Impedance Value: 1083 Ohm
Lead Channel Impedance Value: 1083 Ohm
Lead Channel Impedance Value: 1140 Ohm
Lead Channel Impedance Value: 279.525
Lead Channel Impedance Value: 291.742
Lead Channel Impedance Value: 291.742
Lead Channel Impedance Value: 308.092
Lead Channel Impedance Value: 308.092
Lead Channel Impedance Value: 399 Ohm
Lead Channel Impedance Value: 4047 Ohm
Lead Channel Impedance Value: 475 Ohm
Lead Channel Impedance Value: 532 Ohm
Lead Channel Impedance Value: 589 Ohm
Lead Channel Impedance Value: 646 Ohm
Lead Channel Impedance Value: 646 Ohm
Lead Channel Pacing Threshold Amplitude: 0.625 V
Lead Channel Pacing Threshold Amplitude: 4 V
Lead Channel Pacing Threshold Pulse Width: 0.4 ms
Lead Channel Pacing Threshold Pulse Width: 1.5 ms
Lead Channel Sensing Intrinsic Amplitude: 17.625 mV
Lead Channel Sensing Intrinsic Amplitude: 17.625 mV
Lead Channel Setting Pacing Amplitude: 2.5 V
Lead Channel Setting Pacing Amplitude: 3.5 V
Lead Channel Setting Pacing Pulse Width: 0.4 ms
Lead Channel Setting Pacing Pulse Width: 1.5 ms
Lead Channel Setting Sensing Sensitivity: 0.3 mV

## 2021-08-08 NOTE — Telephone Encounter (Signed)
Patient returning call. Assisted with manual transmission. LV threshold remains 4.0V/1.25 ms which is above programmed output. Patient will be brought in for manual testing 08/09/21 at 2:15 pm. ? ? ? ?

## 2021-08-08 NOTE — Telephone Encounter (Signed)
Pt LMOVM returning nurse call. He is requesting a call back. ?

## 2021-08-08 NOTE — Telephone Encounter (Signed)
Scheduled remote reviewed. Normal device function.   ?Effective BiV pacing 87.6% ?LV threshold 4V @ 1.64m, programmed 3.5V @ 1.568m?Route to triage ?Next remote 91 days. ?LA ? ?Unsuccessful telephone encounter to patient to request follow up remote to reassess LV threshold. [employer] compliant VM message left requesting callback to [phone] threshold remains higher than programmed Voltage will bring patient in for manual testing.  ? ? ? ? ? ?

## 2021-08-09 ENCOUNTER — Other Ambulatory Visit: Payer: Self-pay

## 2021-08-09 DIAGNOSIS — I428 Other cardiomyopathies: Secondary | ICD-10-CM

## 2021-08-12 DIAGNOSIS — N400 Enlarged prostate without lower urinary tract symptoms: Secondary | ICD-10-CM | POA: Insufficient documentation

## 2021-08-12 NOTE — Assessment & Plan Note (Signed)
PSA 1.2, stable from previous. Continue tamsulosin. ?

## 2021-08-16 ENCOUNTER — Encounter: Payer: Self-pay | Admitting: Pharmacist

## 2021-08-16 ENCOUNTER — Other Ambulatory Visit: Payer: Self-pay

## 2021-08-16 ENCOUNTER — Ambulatory Visit (INDEPENDENT_AMBULATORY_CARE_PROVIDER_SITE_OTHER): Payer: HMO | Admitting: Pharmacist

## 2021-08-16 DIAGNOSIS — Z794 Long term (current) use of insulin: Secondary | ICD-10-CM | POA: Diagnosis not present

## 2021-08-16 DIAGNOSIS — E119 Type 2 diabetes mellitus without complications: Secondary | ICD-10-CM | POA: Diagnosis not present

## 2021-08-16 NOTE — Patient Instructions (Addendum)
Nice to see you today! ? ?We are decreasing your Tresiba (insulin degludec) dose to 14 units daily. ?We are decreasing your Novolog (insulin aspart) dose to 4 units and your dinner-time dose will stay the same at 6-8 units.  ?Continue your Trulicity dose and all other medications.  ? ?Try to take Tylenol when your headaches start and try remembering to use your glasses at night when reading to avoid eye straining.  ? ?We will follow up with you in 4 weeks on April 20 at 2:15 pm.  ?

## 2021-08-16 NOTE — Progress Notes (Signed)
? ? ?S:    ? ?Chief Complaint  ?Patient presents with  ? Medication Management  ?  DM f/u  ? ? ?Anthony [name] is a 71 y.o. male who presents for diabetes evaluation, education, and management. PMH is significant for diabetes, hypertension. Patient was referred and last seen by Primary Care Provider, Dr. Posey [name], on 07/19/21.  ? ?Today, He arrives in good spirits and presents without assistance.  ? ?Current diabetes medications include: Tresiba (insulin degludec) 20 units daily, Novolog (insulin aspart) 3-4 units with lunch and 6-8 units BID with evening meal, Trulicity (dulaglutide) 3 mg weekly ?Current hypertension medications include: carvedilol 25 mg BID, furosemide 40 mg daily, hydralazine 100 mg TID, Losartan 25 mg daily ?Current hyperlipidemia medications include: rosuvastatin 20 mg daily ? ?Patient states that He is taking his medications as prescribed. Patient reports adherence with medications. Patient states that He misses his medications 0 times per week, on average. Pt reports that he has not "had to use" his Novolog recently. He reports that he feels he gets low before dinner if he takes it. BG was 417 in office when he scanned his [place] and reports that he had not eaten lunch. ? ?Do you feel that your medications are working for you? Yes. Reports he feels like he has too many but he takes anyways because he knows he needs it. ?Have you been experiencing any side effects to the medications prescribed? no ? ?Patient denies hypoglycemic events. ? ?Date of Download: 08/16/21 ?% Time CGM is active: 84% ?Average Glucose: 209 mg/dL ?Glucose Management Indicator: 8.3  ?Glucose Variability: 25.1 (goal <36%) ?Time in Goal:  ?- Time in range 70-180: 30% ?- Time above range: 70% ?- Time below range: 0% ?Observed patterns: Consistently elevated in 200-250 range. Pt's sugars have had more up and down variability around meal time.  ? ?Patient reports nocturia (nighttime urination) about once a night every  night. ?Patient denies neuropathy (nerve pain). ?Patient denies visual changes. Reports his eyes water regularly. ?Patient reports self foot exams.  ? ?Pt reports a dull pain in his head and that it is aggravating. Mostly happens at night. Denies history of headaches or migraines. Pt reports these are happening ~3 days per week. Denies bilateral pain. Reports it is usually always unilateral pain. Reports trying to sleep on different side does not help. Reports this has started just this week and are 6/10 pain. Pt reports going to sleep helps with the pain. Pt reports he probably needs to begin wearing glasses and that this pain occurs after eye straining with reading at night.  ? ?Patient reported dietary habits: Eats 2 meals/day ?Breakfast: cheese toast, oatmeal ?Lunch: doesn't regularly eat ?Dinner: vegetables, mac and cheese, chicken ?Snacks: peanut butter crackers ?Drinks: water and diet pepsi  ? ?Patient-reported exercise habits: reports he tries to do some walking. Pt reports having trouble with left hip so doesn't do much. Reports walking a bit every day for 5-8 minutes.  ? ? ?O:  ?Physical Exam ?Constitutional:   ?   Appearance: Normal appearance. He is normal weight.  ?Pulmonary:  ?   Effort: Pulmonary effort is normal.  ?Neurological:  ?   Mental Status: He is alert.  ?Psychiatric:     ?   Mood and Affect: Mood normal.     ?   Behavior: Behavior normal.     ?   Thought Content: Thought content normal.     ?   Judgment: Judgment normal.  ? ? ?  Review of Systems  ?All other systems reviewed and are negative. ? ? ?Lab Results  ?Component Value Date  ? HGBA1C 8.7 (A) 06/21/2021  ? ?Vitals:  ? 08/16/21 1504  ?BP: 118/68  ?Pulse: 70  ?SpO2: 99%  ? ? ?Lipid Panel  ?   ?Component Value Date/Time  ? CHOL 128 07/27/2021 0955  ? TRIG 103 07/27/2021 0955  ? HDL 39 (L) 07/27/2021 0955  ? CHOLHDL 3.3 07/27/2021 0955  ? CHOLHDL 3.2 02/28/2016 0906  ? VLDL 19 02/28/2016 0906  ? [place] 70 07/27/2021 0955  ? LDLDIRECT 58  11/06/2011 1434  ? ?A/P: ?Diabetes longstanding currently uncontrolled. Patient is  able to verbalize appropriate hypoglycemia management plan. Medication adherence appears sub-optimal. Control is suboptimal due to non-adherence with meal time insulin. ?-Decreased dose of basal insulin Tresiba (insulin degludec) back to 14 units. ?-Decreased dose of lunch-time rapid insulin Novolog (insulin aspart) to 4 units and continued dinner-time dose at 6-8 units.  ?-Continued GLP-1 Trulicity (generic name dulaglutide) at 3 mg.  ?-Patient educated on purpose, proper use, and potential adverse effects of hypoglycemia.  ?-Extensively discussed pathophysiology of diabetes, recommended lifestyle interventions, dietary effects on blood sugar control.  ?-Counseled on s/sx of and management of hypoglycemia.  ?-Next A1c anticipated 08/2021.  ? ?Pt consulted to try to take Tylenol (acetaminophen) when headache occurs and to try to remember glasses when reading to avoid eye straining. Re-evaluate at next visit. ? ?Hypertension longstanding currently controlled on current regimen. Blood pressure goal of <130/80 mmHg. Medication adherence optimal.  ?-Continue current regimen ? ?Written patient instructions provided. Patient verbalized understanding of treatment plan. Total time in face to face counseling 33 minutes.   ? ?Follow up pharmacist in 4 weeks on 09/13/21. Patient seen with Anthony [name] PharmD Candidate. ? ?

## 2021-08-17 NOTE — Progress Notes (Signed)
Reviewed: I agree with the documentation and management of Dr. Koval. 

## 2021-08-17 NOTE — Assessment & Plan Note (Signed)
Diabetes longstanding currently uncontrolled. Patient is  able to verbalize appropriate hypoglycemia management plan. Medication adherence appears sub-optimal. Control is suboptimal due to non-adherence with meal time insulin. ?-Decreased dose of basal insulin Tresiba (insulin degludec) back to 14 units. ?-Decreased dose of lunch-time rapid insulin Novolog (insulin aspart) to 4 units and continued dinner-time dose at 6-8 units.  ?-Continued GLP-1 Trulicity (generic name dulaglutide) at 3 mg.  ?-Patient educated on purpose, proper use, and potential adverse effects of hypoglycemia ?

## 2021-08-21 NOTE — Progress Notes (Signed)
Remote ICD transmission.   

## 2021-08-28 ENCOUNTER — Other Ambulatory Visit: Payer: Self-pay | Admitting: Family Medicine

## 2021-08-28 DIAGNOSIS — R0609 Other forms of dyspnea: Secondary | ICD-10-CM

## 2021-08-28 DIAGNOSIS — Z95810 Presence of automatic (implantable) cardiac defibrillator: Secondary | ICD-10-CM

## 2021-08-28 DIAGNOSIS — R931 Abnormal findings on diagnostic imaging of heart and coronary circulation: Secondary | ICD-10-CM

## 2021-09-05 ENCOUNTER — Ambulatory Visit (INDEPENDENT_AMBULATORY_CARE_PROVIDER_SITE_OTHER): Payer: HMO | Admitting: Podiatry

## 2021-09-05 ENCOUNTER — Encounter: Payer: Self-pay | Admitting: Podiatry

## 2021-09-05 DIAGNOSIS — N184 Chronic kidney disease, stage 4 (severe): Secondary | ICD-10-CM

## 2021-09-05 DIAGNOSIS — D689 Coagulation defect, unspecified: Secondary | ICD-10-CM

## 2021-09-05 DIAGNOSIS — E119 Type 2 diabetes mellitus without complications: Secondary | ICD-10-CM

## 2021-09-05 DIAGNOSIS — M79609 Pain in unspecified limb: Secondary | ICD-10-CM

## 2021-09-05 DIAGNOSIS — Z7901 Long term (current) use of anticoagulants: Secondary | ICD-10-CM

## 2021-09-05 DIAGNOSIS — B351 Tinea unguium: Secondary | ICD-10-CM

## 2021-09-05 NOTE — Progress Notes (Signed)
This patient returns to my office for at risk foot care.  This patient requires this care by a professional since this patient will be at risk due to having venous insufficiency, chronic kidney disease stage IV diabetes and coagulation defect.  Patient is taking eliquiss.  This patient is unable to cut nails himself since the patient cannot reach his nails.These nails are painful walking and wearing shoes.  This patient presents for at risk foot care today.  General Appearance  Alert, conversant and in no acute stress.  Vascular  Dorsalis pedis and posterior tibial  pulses are palpable  bilaterally.  Capillary return is within normal limits  bilaterally. Temperature is within normal limits  bilaterally.  Neurologic  Senn-Weinstein monofilament wire test within normal limits  bilaterally. Muscle power within normal limits bilaterally.  Nails Thick disfigured discolored nails with subungual debris  from hallux to fifth toes bilaterally. No evidence of bacterial infection or drainage bilaterally.  Orthopedic  No limitations of motion  feet .  No crepitus or effusions noted.  No bony pathology or digital deformities noted.  Amputation second toe left foot.  Skin  normotropic skin with no porokeratosis noted bilaterally.  No signs of infections or ulcers noted.  Porokeratosis sub 4 left and sub 5th right asymptomatic.  Onychomycosis  Pain in right toes  Pain in left toes  Consent was obtained for treatment procedures.   Mechanical debridement of nails 1-5  Right and 1,3-5 left foot. performed with a nail nipper.  Filed with dremel without incident.    Return office visit   9  weeks                   Told patient to return for periodic foot care and evaluation due to potential at risk complications.   Senaida Chilcote DPM 

## 2021-09-12 DIAGNOSIS — Z95810 Presence of automatic (implantable) cardiac defibrillator: Secondary | ICD-10-CM | POA: Diagnosis not present

## 2021-09-12 DIAGNOSIS — E1122 Type 2 diabetes mellitus with diabetic chronic kidney disease: Secondary | ICD-10-CM | POA: Diagnosis not present

## 2021-09-12 DIAGNOSIS — Z794 Long term (current) use of insulin: Secondary | ICD-10-CM | POA: Diagnosis not present

## 2021-09-12 DIAGNOSIS — I2510 Atherosclerotic heart disease of native coronary artery without angina pectoris: Secondary | ICD-10-CM | POA: Diagnosis not present

## 2021-09-12 DIAGNOSIS — N184 Chronic kidney disease, stage 4 (severe): Secondary | ICD-10-CM | POA: Diagnosis not present

## 2021-09-12 DIAGNOSIS — Z87891 Personal history of nicotine dependence: Secondary | ICD-10-CM | POA: Diagnosis not present

## 2021-09-12 DIAGNOSIS — I4891 Unspecified atrial fibrillation: Secondary | ICD-10-CM | POA: Diagnosis not present

## 2021-09-12 DIAGNOSIS — Z8679 Personal history of other diseases of the circulatory system: Secondary | ICD-10-CM | POA: Diagnosis not present

## 2021-09-12 DIAGNOSIS — Z89422 Acquired absence of other left toe(s): Secondary | ICD-10-CM | POA: Diagnosis not present

## 2021-09-12 DIAGNOSIS — Z7985 Long-term (current) use of injectable non-insulin antidiabetic drugs: Secondary | ICD-10-CM | POA: Diagnosis not present

## 2021-09-13 ENCOUNTER — Ambulatory Visit (INDEPENDENT_AMBULATORY_CARE_PROVIDER_SITE_OTHER): Payer: HMO | Admitting: Pharmacist

## 2021-09-13 ENCOUNTER — Encounter: Payer: Self-pay | Admitting: Pharmacist

## 2021-09-13 DIAGNOSIS — E119 Type 2 diabetes mellitus without complications: Secondary | ICD-10-CM | POA: Diagnosis not present

## 2021-09-13 DIAGNOSIS — Z794 Long term (current) use of insulin: Secondary | ICD-10-CM | POA: Diagnosis not present

## 2021-09-13 NOTE — Patient Instructions (Addendum)
Nice to see you today! ? ?Your blood sugars are looking great! To get in even better control, we want to increase your dinner insulin if you are having any type of carb (rice, pasta, potato, bread) to 8 units. If you are only eating salads for dinner, 6 units is fine. ? ?Continue your Tyler Aas at 20 units daily. Continue all other medications.  ? ?You will have a visit with Dr. Andria Frames on May 10 at 2:10 pm. Come back to the pharmacy clinic in June. ? ? ?

## 2021-09-13 NOTE — Assessment & Plan Note (Signed)
Diabetes longstanding currently with improved control. Patient is  able to verbalize appropriate hypoglycemia management plan. Medication adherence appears optimal. Control is suboptimal due to dietary indiscretion and need for medication optimization. ?-Continued basal insulin Tresiba (insulin degludec) at 20 units daily.  ?-Continued rapid insulin Novolog (insulin aspart) at 5 units with lunch and 6-8 units with dinner. Counseled patient to take 8 units if eating any type of carb with dinner but can take 6 if only eating a salad.   ?-Continued GLP-1 Trulicity (dulaglutide) at 3 mg weekly.  ?-Patient educated on purpose, proper use, and potential adverse effects of insulin.  ?

## 2021-09-13 NOTE — Progress Notes (Signed)
? ? ?S:    ? ?Chief Complaint  ?Patient presents with  ? Medication Management  ?  DM f/u  ? ?Anthony [name] is a 71 y.o. male who presents for diabetes evaluation, education, and management. PMH is significant for T2DM, HTN. Patient was referred and last seen by Dr. Posey Pronto, on 07/19/21. Patient was last seen by the pharmacy team on 08/16/21. At last visit, we decreased Tresiba (insulin degludec) to 14 units daily and decreased Novolog (insulin aspart) dose to 4 units at lunch and dinner dose continued at 6-8 units. Patient reports he has been taking these medications differently as seen below.   ? ?Today, patient arrives in okay spirits and presents without assistance. Patient reports feeling a bit down and sluggish today for "some odd reason." Patient reports he does not have a good appetite and low energy today.  ? ?Patient reports his headaches have improved.  ? ?Current diabetes medications include: Tresiba (insulin degludec) 20 units daily, Novolog (insulin aspart) 5 units with lunch and 6-8 units with dinner (most often using 6 units), Trulicity (dulaglutide) 3 mg weekly. Patient reports skipping Novolog dose at lunch if he is not eating a big lunch with carbs but generally uses twice daily with lunch and dinner ?Current hypertension medications include: carvedilol 25 mg BID, furosemide 40 mg daily, hydralazine 100 mg TID, losartan 25 mg daily ?Current hyperlipidemia medications include: rosuvastatin 20 mg daily ? ?Patient reports taking all medications as prescribed. Patient reports adherence with medications. Patient reports missing his medications 0 times per week, on average. ? ?Do you feel that your medications are working for you? yes ?Have you been experiencing any side effects to the medications prescribed? no ? ?Patient denies hypoglycemic events. ? ?Date of Download: 09/13/21 ?% Time CGM is active: 94% ?Average Glucose: 170 mg/dL ?Glucose Management Indicator: 7.4  ?Glucose Variability: 31.8% (goal  <36%) ?Time in Goal:  ?- Time in range 70-180: 66% ?- Time above range: 34% ?- Time below range: 0% ? ?Patient reports nocturia (nighttime urination) 1 time per night every night. ?Patient denies neuropathy (nerve pain). ?Patient denies visual changes. ?Patient reports self foot exams.  ? ?Patient reported dietary habits: Eats 2 meals/day ?Breakfast: does not regularly eat breakfast; had peanut butter crackers yesterday and today ?Lunch: burger, chicken sandwich, fries ?Dinner: seafood, salad, vegetables. Reports weaning self back off of meats. ?Snacks: fruit salad ?Drinks: water. Patient does report fluid intake is restricted due to leg swelling; unsweetened tea.  ? ?Patient-reported exercise habits: walking ? ?O:  ?Physical Exam ?Constitutional:   ?   Appearance: Normal appearance. He is normal weight.  ?Pulmonary:  ?   Effort: Pulmonary effort is normal.  ?Neurological:  ?   Mental Status: He is alert.  ?Psychiatric:     ?   Mood and Affect: Mood normal.     ?   Behavior: Behavior normal.     ?   Thought Content: Thought content normal.     ?   Judgment: Judgment normal.  ? ? ?Review of Systems  ?All other systems reviewed and are negative. ? ? ?Lab Results  ?Component Value Date  ? HGBA1C 8.7 (A) 06/21/2021  ? ?There were no vitals filed for this visit. ? ?Lipid Panel  ?   ?Component Value Date/Time  ? CHOL 128 07/27/2021 0955  ? TRIG 103 07/27/2021 0955  ? HDL 39 (L) 07/27/2021 0955  ? CHOLHDL 3.3 07/27/2021 0955  ? CHOLHDL 3.2 02/28/2016 0906  ? VLDL 19  02/28/2016 0906  ? [place] 70 07/27/2021 0955  ? LDLDIRECT 58 11/06/2011 1434  ? ? ?A/P: ?Diabetes longstanding currently with improved control. Patient is  able to verbalize appropriate hypoglycemia management plan. Medication adherence appears optimal. Control is suboptimal due to dietary indiscretion and need for medication optimization. ?-Continued basal insulin Tresiba (insulin degludec) at 20 units daily.  ?-Continued rapid insulin Novolog (insulin  aspart) at 5 units with lunch and 6-8 units with dinner. Counseled patient to take 8 units if eating any type of carb with dinner but can take 6 if only eating a salad.   ?-Continued GLP-1 Trulicity (dulaglutide) at 3 mg weekly.  ?-Patient educated on purpose, proper use, and potential adverse effects of insulin.  ?-Extensively discussed pathophysiology of diabetes, recommended lifestyle interventions, dietary effects on blood sugar control.  ?-Counseled on s/sx of and management of hypoglycemia.  ?-Next A1c anticipated at next visit.  ? ?Written patient instructions provided. Patient verbalized understanding of treatment plan. Total time in face to face counseling 38 minutes.   ? ?Follow up pharmacist in 2-3 months. PCP clinic visit in 3 weeks on May 10. Patient seen with Earvin Hansen, PharmD Candidate, and Rebbeca Paul, PharmD, PGY2 Pharmacy Resident.  ? ?

## 2021-09-13 NOTE — Progress Notes (Signed)
Reviewed: I agree with Dr. Koval's documentation and management. 

## 2021-10-03 ENCOUNTER — Encounter: Payer: Self-pay | Admitting: Family Medicine

## 2021-10-03 ENCOUNTER — Ambulatory Visit (INDEPENDENT_AMBULATORY_CARE_PROVIDER_SITE_OTHER): Payer: HMO | Admitting: Family Medicine

## 2021-10-03 VITALS — BP 103/63 | HR 72 | Wt 217.8 lb

## 2021-10-03 DIAGNOSIS — Z794 Long term (current) use of insulin: Secondary | ICD-10-CM | POA: Diagnosis not present

## 2021-10-03 DIAGNOSIS — I5022 Chronic systolic (congestive) heart failure: Secondary | ICD-10-CM

## 2021-10-03 DIAGNOSIS — E119 Type 2 diabetes mellitus without complications: Secondary | ICD-10-CM | POA: Diagnosis not present

## 2021-10-03 DIAGNOSIS — I10 Essential (primary) hypertension: Secondary | ICD-10-CM

## 2021-10-03 DIAGNOSIS — I428 Other cardiomyopathies: Secondary | ICD-10-CM

## 2021-10-03 DIAGNOSIS — Z79899 Other long term (current) drug therapy: Secondary | ICD-10-CM | POA: Diagnosis not present

## 2021-10-03 DIAGNOSIS — Z95810 Presence of automatic (implantable) cardiac defibrillator: Secondary | ICD-10-CM

## 2021-10-03 DIAGNOSIS — I5032 Chronic diastolic (congestive) heart failure: Secondary | ICD-10-CM | POA: Diagnosis not present

## 2021-10-03 DIAGNOSIS — N184 Chronic kidney disease, stage 4 (severe): Secondary | ICD-10-CM | POA: Diagnosis not present

## 2021-10-03 LAB — POCT GLYCOSYLATED HEMOGLOBIN (HGB A1C): HbA1c, POC (controlled diabetic range): 8.7 % — AB (ref 0.0–7.0)

## 2021-10-03 MED ORDER — HYDRALAZINE HCL 50 MG PO TABS: 50.0000 mg | ORAL_TABLET | Freq: Three times a day (TID) | ORAL | 3 refills | Status: DC

## 2021-10-03 MED ORDER — FUROSEMIDE 40 MG PO TABS: 40.0000 mg | ORAL_TABLET | Freq: Every day | ORAL | 3 refills | Status: DC

## 2021-10-03 NOTE — Assessment & Plan Note (Signed)
Went over all his meds on AVS and wrote what they treat.   ?

## 2021-10-03 NOTE — Progress Notes (Signed)
? ? ?  SUBJECTIVE:  ? ?CHIEF COMPLAINT / HPI:  ? ?FU multiple important chronic problems ?HFrEF, on multiple appropriate meds.  BP lowish.  He complains of occasional lightheadedness. Denies swelling.  Has 1 flight DOE. ?CKD 4.  Denies swelling.  States taking meds although perhaps a bit confused with polypharmacy. ?HBP as above ?A fib on chronic anticoag.   ?Multiple reasons to be at risk for anemia.   ?CAD, secondary prevention.  On rosuvastatin. ?DM: Dr. Valentina Lucks helping with adjustments. ?Despite all these chronic problems, feels generally well.  Of note, he has lost 10-15 lbs over the last several months. ? ? ?OBJECTIVE:  ? ?BP 103/63   Pulse 72   Wt 217 lb 12.8 oz (98.8 kg)   SpO2 100%   BMI 30.38 kg/m?   ?Low BP noted. ?Lungs clear ?Cardiac RRR without m or g ?Ext trace edema. ? ?ASSESSMENT/PLAN:  ? ?Insulin dependent type 2 diabetes mellitus ([place]) ?Suboptimal control.  Keep working with Dr. Valentina Lucks ? ?Congestive heart failure, NYHA class 3, chronic, systolic ([place]) ?Trace edema.  Check BNP, he might benefit from slightly more diuresiss. ? ?HYPERTENSION, BENIGN SYSTEMIC ?Over treated with lightheadedness.  Decrease hydralazine. ? ?Chronic kidney disease (CKD), stage IV (severe) ([place]) ?Recheck BMP ? ?Polypharmacy ?Went over all his meds on AVS and wrote what they treat.   ?  ? ? ?Zenia Resides, MD ?[place]  ?

## 2021-10-03 NOTE — Assessment & Plan Note (Signed)
Trace edema.  Check BNP, he might benefit from slightly more diuresiss. ?

## 2021-10-03 NOTE — Assessment & Plan Note (Signed)
Recheck BMP.

## 2021-10-03 NOTE — Assessment & Plan Note (Signed)
Suboptimal control.  Keep working with Dr. Valentina Lucks ?

## 2021-10-03 NOTE — Assessment & Plan Note (Signed)
Over treated with lightheadedness.  Decrease hydralazine. ?

## 2021-10-03 NOTE — Patient Instructions (Signed)
I think you are doing pretty well.  I share you hope to get you on less medicine.   ?I did make one change.  I decreased your hydralazine from 100 to 50 mg three times per day.  I hope you have less lightheadedness and less shortness of breath on that new dose.   ?Keep working with Dr. Valentina Lucks about your diabetes. ?I will send a note about the blood test. ?

## 2021-10-04 ENCOUNTER — Other Ambulatory Visit: Payer: Self-pay | Admitting: Family Medicine

## 2021-10-04 LAB — BASIC METABOLIC PANEL
BUN/Creatinine Ratio: 9 — ABNORMAL LOW (ref 10–24)
BUN: 25 mg/dL (ref 8–27)
CO2: 25 mmol/L (ref 20–29)
Calcium: 9.2 mg/dL (ref 8.6–10.2)
Chloride: 99 mmol/L (ref 96–106)
Creatinine, Ser: 2.93 mg/dL — ABNORMAL HIGH (ref 0.76–1.27)
Glucose: 275 mg/dL — ABNORMAL HIGH (ref 70–99)
Potassium: 4 mmol/L (ref 3.5–5.2)
Sodium: 140 mmol/L (ref 134–144)
eGFR: 22 mL/min/{1.73_m2} — ABNORMAL LOW (ref >59)

## 2021-10-04 LAB — CBC
Hematocrit: 37.5 % (ref 37.5–51.0)
Hemoglobin: 12.6 g/dL — ABNORMAL LOW (ref 13.0–17.7)
MCH: 29.6 pg (ref 26.6–33.0)
MCHC: 33.6 g/dL (ref 31.5–35.7)
MCV: 88 fL (ref 79–97)
Platelets: 126 10*3/uL — ABNORMAL LOW (ref 150–450)
RBC: 4.26 x10E6/uL (ref 4.14–5.80)
RDW: 13.2 % (ref 11.6–15.4)
WBC: 6.5 10*3/uL (ref 3.4–10.8)

## 2021-10-04 LAB — BRAIN NATRIURETIC PEPTIDE: BNP: 110.7 pg/mL — ABNORMAL HIGH (ref 0.0–100.0)

## 2021-10-08 ENCOUNTER — Telehealth: Payer: Self-pay

## 2021-10-08 NOTE — Telephone Encounter (Signed)
Pt called needing a new sensor that goes in his arm (freestyle CGM). His ran out. He normally receives this from [employer] but his contract ran out & his bill needs to be paid but needs payment plan. He's been trying to reach the company but he cant get anyone. ? ?Pt unsure how to go about this. ? ?Anything I can do to help? ?

## 2021-10-12 NOTE — Telephone Encounter (Signed)
Contacted patient to follow-up on CGM supply issue.   Patient reported that he contacted insurance, and was able to resolve his [employer] CGM supply issue.  It was covered at his pharmacy and he picked up a new supply yesterday.    Control has been similar to previous with occasional high reading but overall balance between readings of 100-200 and readings > 200.  He denied any low/hypoglycemic readings. No changes in insulin regimen today.   I scheduled a follow-up appointment in ~ 3 weeks in pharmacy clinic with me to reevaluate blood sugar control.

## 2021-10-15 NOTE — Telephone Encounter (Signed)
Noted and agree. 

## 2021-10-25 DIAGNOSIS — E1122 Type 2 diabetes mellitus with diabetic chronic kidney disease: Secondary | ICD-10-CM | POA: Diagnosis not present

## 2021-10-25 DIAGNOSIS — D631 Anemia in chronic kidney disease: Secondary | ICD-10-CM | POA: Diagnosis not present

## 2021-10-25 DIAGNOSIS — N184 Chronic kidney disease, stage 4 (severe): Secondary | ICD-10-CM | POA: Diagnosis not present

## 2021-10-25 DIAGNOSIS — N2581 Secondary hyperparathyroidism of renal origin: Secondary | ICD-10-CM | POA: Diagnosis not present

## 2021-10-25 DIAGNOSIS — I428 Other cardiomyopathies: Secondary | ICD-10-CM | POA: Diagnosis not present

## 2021-10-25 DIAGNOSIS — I129 Hypertensive chronic kidney disease with stage 1 through stage 4 chronic kidney disease, or unspecified chronic kidney disease: Secondary | ICD-10-CM | POA: Diagnosis not present

## 2021-10-25 DIAGNOSIS — I5030 Unspecified diastolic (congestive) heart failure: Secondary | ICD-10-CM | POA: Diagnosis not present

## 2021-11-01 ENCOUNTER — Other Ambulatory Visit: Payer: Self-pay | Admitting: Family Medicine

## 2021-11-01 ENCOUNTER — Encounter: Payer: Self-pay | Admitting: Pharmacist

## 2021-11-01 ENCOUNTER — Ambulatory Visit (INDEPENDENT_AMBULATORY_CARE_PROVIDER_SITE_OTHER): Payer: HMO | Admitting: Pharmacist

## 2021-11-01 VITALS — BP 135/75 | HR 70 | Wt 222.0 lb

## 2021-11-01 DIAGNOSIS — M25561 Pain in right knee: Secondary | ICD-10-CM

## 2021-11-01 DIAGNOSIS — E119 Type 2 diabetes mellitus without complications: Secondary | ICD-10-CM

## 2021-11-01 DIAGNOSIS — N184 Chronic kidney disease, stage 4 (severe): Secondary | ICD-10-CM

## 2021-11-01 DIAGNOSIS — Z794 Long term (current) use of insulin: Secondary | ICD-10-CM | POA: Diagnosis not present

## 2021-11-01 MED ORDER — NOVOLOG FLEXPEN 100 UNIT/ML ~~LOC~~ SOPN: PEN_INJECTOR | SUBCUTANEOUS | 11 refills | Status: DC

## 2021-11-01 MED ORDER — GABAPENTIN 300 MG PO CAPS: 300.0000 mg | ORAL_CAPSULE | Freq: Every day | ORAL | 3 refills | Status: DC

## 2021-11-01 MED ORDER — TAMSULOSIN HCL 0.4 MG PO CAPS: ORAL_CAPSULE | ORAL | 3 refills | Status: DC

## 2021-11-01 MED ORDER — POTASSIUM CHLORIDE CRYS ER 20 MEQ PO TBCR: 20.0000 meq | EXTENDED_RELEASE_TABLET | Freq: Every day | ORAL | 3 refills | Status: DC

## 2021-11-01 MED ORDER — TRESIBA FLEXTOUCH 200 UNIT/ML ~~LOC~~ SOPN: 16.0000 [IU] | PEN_INJECTOR | Freq: Every day | SUBCUTANEOUS | 3 refills | Status: DC

## 2021-11-01 MED ORDER — TRULICITY 4.5 MG/0.5ML ~~LOC~~ SOAJ: 4.5000 mg | SUBCUTANEOUS | 11 refills | Status: DC

## 2021-11-01 NOTE — Assessment & Plan Note (Signed)
Diabetes longstanding currently uncontrolled with last A1c 8.7 (May 2023). Patient is able to verbalize appropriate hypoglycemia management plan. Medication adherence appears appropriate. Suspect elevated BG today is from stress of pain from knee. Emphasized taking rapid acting insulin BEFORE meals.  -Increase Trulicity (dulaglutide) to 4.5 mg weekly.  -Continue Tresiba (insulin glargine) 16 units daily.  -Adjust Novolog (insulin aspart) to 5 units before lunch and 7 units before dinner.  -Extensively discussed pathophysiology of diabetes, recommended lifestyle interventions, dietary effects on blood sugar control.  -Counseled on s/sx of and management of hypoglycemia.  -Next A1c anticipated August 2023.

## 2021-11-01 NOTE — Progress Notes (Signed)
Reviewed: I agree with the documentation and management of Dr. Valentina Lucks.  Also, I saw for acute onset right knee pain.  Was having left hip pain through yesterday.  After a good night's sleep, left hip pain resolved and now has right knee pain.  No trauma.  No known gout.  No fever, just knee pain.  Exam, no redness.  Likely modest effusion.   Diff Dx is gout versus overuse - acute flare of right knee osteoarthritis.  Likely favored right leg when left hip was bothering him.   Will check uric acid and call with results

## 2021-11-01 NOTE — Progress Notes (Signed)
S:     Chief Complaint  Patient presents with   Medication Management    Diabetes f/u   Anthony [name] is a 71 y.o. male who presents for diabetes evaluation, education, and management. PMH is significant for T2DM, HTN. Patient was referred and last seen by Primary Care Provider, Dr. Andria Frames, on 10/03/21. Hydralazine was decreased due to reports of lightheadedness. Last seen by pharmacy clinic on 09/13/21.   Today, patient arrives in good spirits and presents with assistance of a cane. He reports he started having knee pain today. Rates it as 10/10. Denies any lightheadedness since dose decrease of hydralazine.   Current diabetes medications include: Tresiba (insulin degludec) 16 units daily, Novolog (insulin aspart) 5-6 units with lunch and 6-8 units with dinner (most often using 6 units), Trulicity (dulaglutide) 3 mg weekly Current hypertension medications include: carvedilol 25 mg BID, furosemide 40 mg daily, hydralazine 100 mg TID, losartan 25 mg daily Current hyperlipidemia medications include: rosuvastatin 20 mg daily  Patient reports taking all medications as prescribed. Patient reports adherence with medications.   Do you feel that your medications are working for you? yes Have you been experiencing any side effects to the medications prescribed? no Do you have any problems obtaining medications due to transportation or finances? yes Insurance coverage: Medicare - reports trouble affording Eliquis (apixaban). Asks if he can have some samples while he saves a couple weeks for his copay (~$45).   Patient denies hypoglycemic events.  Date of Download: 11/01/21 % Time CGM is active: 97% Average Glucose: 207 mg/dL Glucose Management Indicator: 8.3  Glucose Variability: 36.8 (goal <36%) Time in Goal:  - Time in range 70-180: 39% - Time above range: 61% - Time below range: 0%  Patient reported dietary habits: Eats 2 meals/day Breakfast: mostly skips/sleeps late Lunch: cereal,  toast with cheese, sausage Dinner: beans, rice, chicken, fish, shrimp; no pork or beef Snacks: peanut butter and crackers Drinks: water  Patient-reported exercise habits: walking   O:  Physical Exam Vitals reviewed.  Cardiovascular:     Rate and Rhythm: Normal rate.  Pulmonary:     Effort: Pulmonary effort is normal.  Musculoskeletal:        General: Swelling (R knee) and tenderness (R knee) present.  Neurological:     Mental Status: He is alert.  Psychiatric:        Mood and Affect: Mood normal.        Behavior: Behavior normal.        Thought Content: Thought content normal.    Review of Systems  Musculoskeletal:  Positive for joint pain (R knee, 10/10).    Lab Results  Component Value Date   HGBA1C 8.7 (A) 10/03/2021   Vitals:   11/01/21 1536  BP: 135/75  Pulse: 70  SpO2: 100%    Lipid Panel     Component Value Date/Time   CHOL 128 07/27/2021 0955   TRIG 103 07/27/2021 0955   HDL 39 (L) 07/27/2021 0955   CHOLHDL 3.3 07/27/2021 0955   CHOLHDL 3.2 02/28/2016 0906   VLDL 19 02/28/2016 0906   LDLCALC 70 07/27/2021 0955   LDLDIRECT 58 11/06/2011 1434     A/P: Diabetes longstanding currently uncontrolled with last A1c 8.7 (May 2023). Patient is able to verbalize appropriate hypoglycemia management plan. Medication adherence appears appropriate. Suspect elevated BG today is from stress of pain from knee. Emphasized taking rapid acting insulin BEFORE meals.  -Increase Trulicity (dulaglutide) to 4.5 mg weekly.  -Continue  Tresiba (insulin glargine) 16 units daily.  -Adjust Novolog (insulin aspart) to 5 units before lunch and 7 units before dinner.  -Extensively discussed pathophysiology of diabetes, recommended lifestyle interventions, dietary effects on blood sugar control.  -Counseled on s/sx of and management of hypoglycemia.  -Next A1c anticipated August 2023.   Right knee pain and swelling. Consulted with Dr. Andria Frames who evaluated the patient. Differential  includes gout or possibly new knee pain on R side due to favoring R side given history of L hip pain.  -Ordered uric acid per Dr. Andria Frames.   Medication Samples have been provided to the patient.  Drug name: Eliquis (apixaban)  Qty: 56 tablets (28 day supply)  LOT: [id number]  Exp.Date: 09/24/23  The patient has been instructed regarding the correct time, dose, and frequency of taking this medication, including desired effects and most common side effects.   Janeann Forehand 4:04 PM 11/01/2021  Provided refills for gabapentin, tamsulosin, and potassium chloride per patient request.   Written patient instructions provided. Patient verbalized understanding of treatment plan. Total time in face to face counseling 57 minutes.    Follow up pharmacist visit in 1 month and PCP clinic visit in 2 months. Patient seen with Berdie Ogren, PharmD Candidate, and Rebbeca Paul, PharmD, PGY2 Pharmacy Resident.

## 2021-11-01 NOTE — Patient Instructions (Addendum)
It was nice to see you today!  Your goal blood sugar is 80-130 before eating and less than 180 after eating.  Medication Changes: Increase Trulicity to 4.5 mg once a week (1 shot). Use up your current supply first.   Continue Tresiba 16 units daily.   Adjust Novolog to 5 units BEFORE lunch and 7 units BEFORE dinner. About 15 minutes before.   If you have any trouble getting the Trulicity, call us.   Next follow up visit with Dr. Valentina Lucks on Thursday July 20th at 3:00 PM.   Call to schedule an appointment with Dr. Andria Frames for middle of August.   Monitor blood sugars at home and keep a log (glucometer or piece of paper) to bring with you to your next visit.  Keep up the good work with diet and exercise. Aim for a diet full of vegetables, fruit and lean meats (chicken, [place], fish). Try to limit salt intake by eating fresh or frozen vegetables (instead of canned), rinse canned vegetables prior to cooking and do not add any additional salt to meals.

## 2021-11-02 ENCOUNTER — Telehealth: Payer: Self-pay | Admitting: Pharmacist

## 2021-11-02 LAB — URIC ACID: Uric Acid: 5.4 mg/dL (ref 3.8–8.4)

## 2021-11-02 NOTE — Telephone Encounter (Signed)
-----   Message from Zenia Resides, MD sent at 11/02/2021  9:35 AM EDT ----- Juluis Rainier. ----- Message ----- From: Lavone Neri Lab Results In Sent: 11/02/2021   8:14 AM EDT To: Zenia Resides, MD

## 2021-11-02 NOTE — Telephone Encounter (Signed)
Left HIPAA compliant message that lab work completed yesterday was normal.

## 2021-11-05 ENCOUNTER — Other Ambulatory Visit (HOSPITAL_COMMUNITY): Payer: Self-pay

## 2021-11-05 ENCOUNTER — Telehealth: Payer: Self-pay

## 2021-11-05 NOTE — Telephone Encounter (Signed)
A Prior Authorization was initiated for this patients TRULICITY 4.'5MG'$  through CoverMyMeds.   Key: [id number]

## 2021-11-06 ENCOUNTER — Other Ambulatory Visit (HOSPITAL_COMMUNITY): Payer: Self-pay

## 2021-11-06 ENCOUNTER — Ambulatory Visit: Payer: HMO

## 2021-11-06 ENCOUNTER — Ambulatory Visit (INDEPENDENT_AMBULATORY_CARE_PROVIDER_SITE_OTHER): Payer: HMO

## 2021-11-06 DIAGNOSIS — I428 Other cardiomyopathies: Secondary | ICD-10-CM | POA: Diagnosis not present

## 2021-11-06 NOTE — Telephone Encounter (Signed)
Prior Auth for patients medication TRULICITY approved by [employer] from  11/06/21 to 11/06/22.  Key: [id number] [id number]

## 2021-11-07 ENCOUNTER — Ambulatory Visit (INDEPENDENT_AMBULATORY_CARE_PROVIDER_SITE_OTHER): Payer: HMO | Admitting: Podiatry

## 2021-11-07 ENCOUNTER — Encounter: Payer: Self-pay | Admitting: Podiatry

## 2021-11-07 DIAGNOSIS — M79609 Pain in unspecified limb: Secondary | ICD-10-CM | POA: Diagnosis not present

## 2021-11-07 DIAGNOSIS — D689 Coagulation defect, unspecified: Secondary | ICD-10-CM | POA: Diagnosis not present

## 2021-11-07 DIAGNOSIS — N184 Chronic kidney disease, stage 4 (severe): Secondary | ICD-10-CM

## 2021-11-07 DIAGNOSIS — B351 Tinea unguium: Secondary | ICD-10-CM

## 2021-11-07 DIAGNOSIS — E119 Type 2 diabetes mellitus without complications: Secondary | ICD-10-CM | POA: Diagnosis not present

## 2021-11-07 NOTE — Progress Notes (Signed)
This patient returns to my office for at risk foot care.  This patient requires this care by a professional since this patient will be at risk due to having venous insufficiency, chronic kidney disease stage IV diabetes and coagulation defect.  Patient is taking eliquiss.  This patient is unable to cut nails himself since the patient cannot reach his nails.These nails are painful walking and wearing shoes.  This patient presents for at risk foot care today.  General Appearance  Alert, conversant and in no acute stress.  Vascular  Dorsalis pedis and posterior tibial  pulses are palpable  bilaterally.  Capillary return is within normal limits  bilaterally. Temperature is within normal limits  bilaterally.  Neurologic  Senn-Weinstein monofilament wire test within normal limits  bilaterally. Muscle power within normal limits bilaterally.  Nails Thick disfigured discolored nails with subungual debris  from hallux to fifth toes bilaterally. No evidence of bacterial infection or drainage bilaterally.  Orthopedic  No limitations of motion  feet .  No crepitus or effusions noted.  No bony pathology or digital deformities noted.  Amputation second toe left foot.  Skin  normotropic skin with no porokeratosis noted bilaterally.  No signs of infections or ulcers noted.  Porokeratosis sub 4 left and sub 5th right asymptomatic.  Onychomycosis  Pain in right toes  Pain in left toes  Consent was obtained for treatment procedures.   Mechanical debridement of nails 1-5  Right and 1,3-5 left foot. performed with a nail nipper.  Filed with dremel without incident.    Return office visit   9  weeks                   Told patient to return for periodic foot care and evaluation due to potential at risk complications.   Corene Resnick DPM 

## 2021-11-07 NOTE — Telephone Encounter (Signed)
I called patient and informed him that he should be able to pick up his Trulicity prescription.

## 2021-11-09 ENCOUNTER — Telehealth: Payer: Self-pay

## 2021-11-09 LAB — CUP PACEART REMOTE DEVICE CHECK
Battery Remaining Longevity: 6 mo
Battery Remaining Longevity: 6 mo
Battery Voltage: 2.82 V
Battery Voltage: 2.82 V
Brady Statistic AP VP Percent: 0 %
Brady Statistic AP VP Percent: 0 %
Brady Statistic AP VS Percent: 0 %
Brady Statistic AP VS Percent: 0 %
Brady Statistic AS VP Percent: 0 %
Brady Statistic AS VP Percent: 0 %
Brady Statistic AS VS Percent: 0 %
Brady Statistic AS VS Percent: 0 %
Brady Statistic RA Percent Paced: 0 %
Brady Statistic RA Percent Paced: 0 %
Brady Statistic RV Percent Paced: 98.71 %
Brady Statistic RV Percent Paced: 98.71 %
Date Time Interrogation Session: 20230614140119
Date Time Interrogation Session: 20230614140119
HighPow Impedance: 63 Ohm
HighPow Impedance: 63 Ohm
Implantable Lead Implant Date: 20190301
Implantable Lead Implant Date: 20190301
Implantable Lead Implant Date: 20190301
Implantable Lead Implant Date: 20190301
Implantable Lead Location: 753858
Implantable Lead Location: 753860
Implantable Lead Location: 753858
Implantable Lead Location: 753860
Implantable Pulse Generator Implant Date: 20190301
Implantable Pulse Generator Implant Date: 20190301
Lead Channel Impedance Value: 1007 Ohm
Lead Channel Impedance Value: 1026 Ohm
Lead Channel Impedance Value: 1064 Ohm
Lead Channel Impedance Value: 1064 Ohm
Lead Channel Impedance Value: 1083 Ohm
Lead Channel Impedance Value: 1083 Ohm
Lead Channel Impedance Value: 284.683
Lead Channel Impedance Value: 284.683
Lead Channel Impedance Value: 294.5 Ohm
Lead Channel Impedance Value: 297.365
Lead Channel Impedance Value: 308.092
Lead Channel Impedance Value: 399 Ohm
Lead Channel Impedance Value: 4047 Ohm
Lead Channel Impedance Value: 456 Ohm
Lead Channel Impedance Value: 551 Ohm
Lead Channel Impedance Value: 589 Ohm
Lead Channel Impedance Value: 589 Ohm
Lead Channel Impedance Value: 646 Ohm
Lead Channel Impedance Value: 1007 Ohm
Lead Channel Impedance Value: 1026 Ohm
Lead Channel Impedance Value: 1064 Ohm
Lead Channel Impedance Value: 1064 Ohm
Lead Channel Impedance Value: 1083 Ohm
Lead Channel Impedance Value: 1083 Ohm
Lead Channel Impedance Value: 284.683
Lead Channel Impedance Value: 284.683
Lead Channel Impedance Value: 294.5 Ohm
Lead Channel Impedance Value: 297.365
Lead Channel Impedance Value: 308.092
Lead Channel Impedance Value: 399 Ohm
Lead Channel Impedance Value: 4047 Ohm
Lead Channel Impedance Value: 456 Ohm
Lead Channel Impedance Value: 551 Ohm
Lead Channel Impedance Value: 589 Ohm
Lead Channel Impedance Value: 589 Ohm
Lead Channel Impedance Value: 646 Ohm
Lead Channel Pacing Threshold Amplitude: 0.5 V
Lead Channel Pacing Threshold Amplitude: 3.25 V
Lead Channel Pacing Threshold Amplitude: 0.5 V
Lead Channel Pacing Threshold Amplitude: 3.25 V
Lead Channel Pacing Threshold Pulse Width: 0.4 ms
Lead Channel Pacing Threshold Pulse Width: 1.5 ms
Lead Channel Pacing Threshold Pulse Width: 0.4 ms
Lead Channel Pacing Threshold Pulse Width: 1.5 ms
Lead Channel Sensing Intrinsic Amplitude: 11 mV
Lead Channel Sensing Intrinsic Amplitude: 11 mV
Lead Channel Sensing Intrinsic Amplitude: 11 mV
Lead Channel Sensing Intrinsic Amplitude: 11 mV
Lead Channel Setting Pacing Amplitude: 2.5 V
Lead Channel Setting Pacing Amplitude: 3.75 V
Lead Channel Setting Pacing Amplitude: 2.5 V
Lead Channel Setting Pacing Amplitude: 3.75 V
Lead Channel Setting Pacing Pulse Width: 0.4 ms
Lead Channel Setting Pacing Pulse Width: 1.5 ms
Lead Channel Setting Pacing Pulse Width: 0.4 ms
Lead Channel Setting Pacing Pulse Width: 1.5 ms
Lead Channel Setting Sensing Sensitivity: 0.3 mV
Lead Channel Setting Sensing Sensitivity: 0.3 mV

## 2021-11-09 NOTE — Telephone Encounter (Signed)
I spoke with the patient and let him know that he has 6 months on his battery. I answered the patients questions.

## 2021-11-09 NOTE — Telephone Encounter (Signed)
Increased ICD remote checks to monthly, battery has estimate 6 months left.   Attempted to contact patient to advise, No answer, LMTCB.

## 2021-11-21 NOTE — Progress Notes (Signed)
Remote ICD transmission.   

## 2021-12-07 ENCOUNTER — Other Ambulatory Visit (HOSPITAL_COMMUNITY): Payer: Self-pay

## 2021-12-07 ENCOUNTER — Telehealth: Payer: Self-pay

## 2021-12-07 DIAGNOSIS — E785 Hyperlipidemia, unspecified: Secondary | ICD-10-CM | POA: Diagnosis not present

## 2021-12-07 DIAGNOSIS — Z6829 Body mass index (BMI) 29.0-29.9, adult: Secondary | ICD-10-CM | POA: Diagnosis not present

## 2021-12-07 DIAGNOSIS — I10 Essential (primary) hypertension: Secondary | ICD-10-CM | POA: Diagnosis not present

## 2021-12-07 DIAGNOSIS — I4821 Permanent atrial fibrillation: Secondary | ICD-10-CM

## 2021-12-07 MED ORDER — APIXABAN 5 MG PO TABS: 5.0000 mg | ORAL_TABLET | Freq: Two times a day (BID) | ORAL | 0 refills | Status: DC

## 2021-12-07 NOTE — Telephone Encounter (Signed)
Patient calls nurse line requesting Eliquis samples.   Patient reports he took his last pill this morning. Patient reports he has reached out to his Cardiologist, however has not heard back in regards to samples.   Patient has apt with [facility] on 7/20.  Will forward to pharmacy team for sample approval.

## 2021-12-07 NOTE — Telephone Encounter (Signed)
Pt has insurance and has a copay of $45 he cannot afford this month. He would like samples to hold him over until he can pickup his medication next month.  If we have '5mg'$  samples could someone prepare him a few weeks worth? I can give him a call back once prepared. (I wont be back in office unitl Tuesday)  I will look into assistance for him as well since he was enrolled last year. He has 2 insurances this year and may not qualify with BMS.

## 2021-12-07 NOTE — Telephone Encounter (Signed)
Prepared Eliquis samples. Patient takes Eliquis 5 mg BID. Set aside two boxes of 5 mg Eliquis tablets. This will give patient a two week supply.   Lot: [id number] Expiration: 07/25/2023  Set samples up front for patient pick up, per [facility].   Talbot Grumbling, RN

## 2021-12-07 NOTE — Addendum Note (Signed)
Addended by: Talbot Grumbling on: 12/07/2021 03:11 PM   Modules accepted: Orders

## 2021-12-10 ENCOUNTER — Ambulatory Visit (INDEPENDENT_AMBULATORY_CARE_PROVIDER_SITE_OTHER): Payer: HMO

## 2021-12-10 DIAGNOSIS — I5032 Chronic diastolic (congestive) heart failure: Secondary | ICD-10-CM

## 2021-12-10 DIAGNOSIS — I428 Other cardiomyopathies: Secondary | ICD-10-CM

## 2021-12-10 NOTE — Telephone Encounter (Signed)
Provided patient with samples on Friday (7/14) afternoon.   Talbot Grumbling, RN

## 2021-12-10 NOTE — Telephone Encounter (Signed)
Noted and agree. 

## 2021-12-12 LAB — CUP PACEART REMOTE DEVICE CHECK
Battery Remaining Longevity: 4 mo
Battery Voltage: 2.82 V
Brady Statistic AP VP Percent: 0 %
Brady Statistic AP VS Percent: 0 %
Brady Statistic AS VP Percent: 0 %
Brady Statistic AS VS Percent: 0 %
Brady Statistic RA Percent Paced: 0 %
Brady Statistic RV Percent Paced: 97.78 %
Date Time Interrogation Session: 20230718151416
HighPow Impedance: 68 Ohm
Implantable Lead Implant Date: 20190301
Implantable Lead Implant Date: 20190301
Implantable Lead Location: 753858
Implantable Lead Location: 753860
Implantable Pulse Generator Implant Date: 20190301
Lead Channel Impedance Value: 1197 Ohm
Lead Channel Impedance Value: 1197 Ohm
Lead Channel Impedance Value: 1197 Ohm
Lead Channel Impedance Value: 1197 Ohm
Lead Channel Impedance Value: 1197 Ohm
Lead Channel Impedance Value: 1235 Ohm
Lead Channel Impedance Value: 327.681
Lead Channel Impedance Value: 336.648
Lead Channel Impedance Value: 336.648
Lead Channel Impedance Value: 341.736
Lead Channel Impedance Value: 341.736
Lead Channel Impedance Value: 399 Ohm
Lead Channel Impedance Value: 4047 Ohm
Lead Channel Impedance Value: 475 Ohm
Lead Channel Impedance Value: 646 Ohm
Lead Channel Impedance Value: 665 Ohm
Lead Channel Impedance Value: 703 Ohm
Lead Channel Impedance Value: 703 Ohm
Lead Channel Pacing Threshold Amplitude: 0.625 V
Lead Channel Pacing Threshold Amplitude: 3.25 V
Lead Channel Pacing Threshold Pulse Width: 0.4 ms
Lead Channel Pacing Threshold Pulse Width: 1.5 ms
Lead Channel Sensing Intrinsic Amplitude: 11 mV
Lead Channel Sensing Intrinsic Amplitude: 11 mV
Lead Channel Setting Pacing Amplitude: 2.5 V
Lead Channel Setting Pacing Amplitude: 3.75 V
Lead Channel Setting Pacing Pulse Width: 0.4 ms
Lead Channel Setting Pacing Pulse Width: 1.5 ms
Lead Channel Setting Sensing Sensitivity: 0.3 mV

## 2021-12-13 ENCOUNTER — Encounter: Payer: Self-pay | Admitting: Pharmacist

## 2021-12-13 ENCOUNTER — Ambulatory Visit (INDEPENDENT_AMBULATORY_CARE_PROVIDER_SITE_OTHER): Payer: HMO | Admitting: Pharmacist

## 2021-12-13 VITALS — BP 134/84 | HR 71 | Wt 220.0 lb

## 2021-12-13 DIAGNOSIS — Z794 Long term (current) use of insulin: Secondary | ICD-10-CM | POA: Diagnosis not present

## 2021-12-13 DIAGNOSIS — I4821 Permanent atrial fibrillation: Secondary | ICD-10-CM | POA: Diagnosis not present

## 2021-12-13 DIAGNOSIS — E119 Type 2 diabetes mellitus without complications: Secondary | ICD-10-CM

## 2021-12-13 DIAGNOSIS — R29898 Other symptoms and signs involving the musculoskeletal system: Secondary | ICD-10-CM

## 2021-12-13 MED ORDER — APIXABAN 5 MG PO TABS: 5.0000 mg | ORAL_TABLET | Freq: Two times a day (BID) | ORAL | 0 refills | Status: DC

## 2021-12-13 MED ORDER — DICLOFENAC SODIUM 1 % EX GEL: 4.0000 g | Freq: Four times a day (QID) | CUTANEOUS | 0 refills | Status: DC

## 2021-12-13 MED ORDER — PEN NEEDLES 32G X 4 MM MISC: 1.0000 | 11 refills | Status: DC

## 2021-12-13 NOTE — Assessment & Plan Note (Signed)
Diabetes longstanding currently uncontrolled. Patient is able to verbalize appropriate hypoglycemia management plan. Medication adherence appears transient, only occasionally takes lunch time Novolog. Control is suboptimal due to non-adherence to meal time insulin. -Continued basal insulin Tresiba (insulin degludec) 16 units daily. -Continued rapid insulin Novolog (insulin aspart) 5 units prior to lunch and 7 units prior to dinner.  Reinforced need for more consistently taking mid-day lunch Novolog - patient agreed.  -Continued GLP-1 Trulicity (generic dulaglutide) 4.5 mg weekly. Refills for pen needles sent.  -Patient not agreeable to restarting SGLT2-I at this time.  -Extensively discussed pathophysiology of diabetes, recommended lifestyle interventions, dietary effects on blood sugar control.  -Counseled on s/sx of and management of hypoglycemia.  -Next A1c anticipated next office visit

## 2021-12-13 NOTE — Progress Notes (Signed)
S:    Chief Complaint  Patient presents with   Diabetes   Anthony [name] is a 71 y.o. male who presents for diabetes evaluation, education, and management.  PMH is significant for T2DM, HTN. Patient was referred and last seen by Primary Care Provider, Dr. Andria Frames, on 10/03/21. Last seen by pharmacy 11/01/2021. At last visit, Trulicity was increased to 4.5 mg and Novolog was adjusted to 5 units before lunch and 7 units before dinner.   Today, patient arrives in good spirits and presents with the assistance of a cane. He reports that he regularly misses his lunch insulin. He states he usually doesn't take it if his blood glucose is <180 mg/dL. Patient states he is not willing to restart SGLT2i at this time as it made him feel foggy.   Current diabetes medications include: Tresiba (insulin degludec) 16 units daily, Novolog 5 units with lunch (misses regularly) and 6-8 units before dinner (prescribed as 7 units), most commonly takes 6 units prior to evening meal.  Trulicity (dulaglutide) 4.5 mg weekly Current hypertension medications include: carvedilol 25 mg BID, furosemide 40 mg daily, hydralazine 50 mg TID Current hyperlipidemia medications include: rosuvastatin 20 mg daily  Patient reports adherence to taking all medications as prescribed.   Do you feel that your medications are working for you? yes Have you been experiencing any side effects to the medications prescribed? no Do you have any problems obtaining medications due to transportation or finances? Yes, price of Eliquis Insurance coverage: [employer]   Patient denies hypoglycemic events.  Patient reports nocturia (nighttime urination). 2x/night  Patient denies neuropathy (nerve pain). Patient denies visual changes. Patient reports self foot exams.   Patient reported dietary habits: Eats 2 meals/day Breakfast: usually skips Lunch: PB crackers, apple sauce Dinner: salad with chicken strips Snacks: 9PM usually, PB  crackers  Drinks: diet Pepsi or water   Within the past 12 months, did you worry whether your food would run out before you got money to buy more? no Within the past 12 months, did the food you bought run out, and you didn't have money to get more? no  O:  Review of Systems  All other systems reviewed and are negative.  Physical Exam Constitutional:      Appearance: Normal appearance.  Pulmonary:     Effort: Pulmonary effort is normal.  Neurological:     Mental Status: He is alert.  Psychiatric:        Mood and Affect: Mood normal.        Behavior: Behavior normal.    11/30/21-12/13/21 CGM Download:  % Time CGM is active: 88% Average Glucose: 206 mg/dL Glucose Management Indicator: 8.2  Glucose Variability: 29.3 (goal <36%) Time in Goal:  - Time in range 70-180: 35% - Time above range: 65% - Time below range: 0%   Lab Results  Component Value Date   HGBA1C 8.7 (A) 10/03/2021   Vitals:   12/13/21 1529  BP: 134/84  Pulse: 71  SpO2: 100%    Lipid Panel     Component Value Date/Time   CHOL 128 07/27/2021 0955   TRIG 103 07/27/2021 0955   HDL 39 (L) 07/27/2021 0955   CHOLHDL 3.3 07/27/2021 0955   CHOLHDL 3.2 02/28/2016 0906   VLDL 19 02/28/2016 0906   LDLCALC 70 07/27/2021 0955   LDLDIRECT 58 11/06/2011 1434    A/P: Diabetes longstanding currently uncontrolled. Patient is able to verbalize appropriate hypoglycemia management plan. Medication adherence appears transient, only  occasionally takes lunch time Novolog. Control is suboptimal due to non-adherence to meal time insulin. -Continued basal insulin Tresiba (insulin degludec) 16 units daily. -Continued rapid insulin Novolog (insulin aspart) 5 units prior to lunch and 7 units prior to dinner.  Reinforced need for more consistently taking mid-day lunch Novolog - patient agreed.  -Continued GLP-1 Trulicity (generic dulaglutide) 4.5 mg weekly. Refills for pen needles sent.  -Patient not agreeable to restarting  SGLT2-I at this time.  -Extensively discussed pathophysiology of diabetes, recommended lifestyle interventions, dietary effects on blood sugar control.  -Counseled on s/sx of and management of hypoglycemia.  -Next A1c anticipated next office visit.   Chronic anticoagulation. Patient requested sample of Eliquis. Provided 2 weeks of samples. Denies bruising and bleeding.   Written patient instructions provided. Patient verbalized understanding of treatment plan.  Total time in face to face counseling 32 minutes.    Follow-up:  Pharmacist in Sept. PCP clinic visit in 1 month.  Patient seen with Joseph Art, PharmD, PGY2 Pharmacy Resident.

## 2021-12-13 NOTE — Patient Instructions (Addendum)
It was nice to see you today!  Your goal blood sugar is 80-130 before eating and less than 180 after eating.  Medication Changes: Continue Tresiba (insulin degludec) 16 units daily Take Novolog (insulin aspart) 5 units before lunch and 7 units before dinner  Schedule an appointment with Dr. Andria Frames in August.   Monitor blood sugars at home and keep a log (glucometer or piece of paper) to bring with you to your next visit.  Keep up the good work with diet and exercise. Aim for a diet full of vegetables, fruit and lean meats (chicken, [place], fish). Try to limit salt intake by eating fresh or froznot en vegetables (instead of canned), rinse canned vegetables prior to cooking and do not add any additional salt to meals.

## 2021-12-14 NOTE — Progress Notes (Signed)
Reviewed: I agree with Dr. Koval's documentation and management. 

## 2021-12-17 ENCOUNTER — Telehealth: Payer: Self-pay

## 2021-12-17 NOTE — Telephone Encounter (Signed)
A Prior Authorization was initiated for this patients DICLOFENAC GEL through CoverMyMeds.   Key: [id number]

## 2021-12-18 NOTE — Telephone Encounter (Signed)
Prior Auth for patients medication DICLOFENAC GEL denied by [facility] via CoverMyMeds.   Reason: The policy states the requested drug may be covered when it is used for osteoarthritis pain in joints susceptible to topical treatment, such as feet, ankles, knees, hands, wrists, or elbows  CoverMyMeds Key: [id number]   Denial letter scanned to chart

## 2022-01-07 ENCOUNTER — Ambulatory Visit (INDEPENDENT_AMBULATORY_CARE_PROVIDER_SITE_OTHER): Payer: HMO | Admitting: Family Medicine

## 2022-01-07 ENCOUNTER — Encounter: Payer: Self-pay | Admitting: Family Medicine

## 2022-01-07 VITALS — BP 126/82 | HR 92 | Ht 72.0 in | Wt 216.0 lb

## 2022-01-07 DIAGNOSIS — I428 Other cardiomyopathies: Secondary | ICD-10-CM | POA: Diagnosis not present

## 2022-01-07 DIAGNOSIS — Z794 Long term (current) use of insulin: Secondary | ICD-10-CM | POA: Diagnosis not present

## 2022-01-07 DIAGNOSIS — N184 Chronic kidney disease, stage 4 (severe): Secondary | ICD-10-CM | POA: Diagnosis not present

## 2022-01-07 DIAGNOSIS — Z596 Low income: Secondary | ICD-10-CM

## 2022-01-07 DIAGNOSIS — M5441 Lumbago with sciatica, right side: Secondary | ICD-10-CM

## 2022-01-07 DIAGNOSIS — E119 Type 2 diabetes mellitus without complications: Secondary | ICD-10-CM | POA: Diagnosis not present

## 2022-01-07 DIAGNOSIS — G8929 Other chronic pain: Secondary | ICD-10-CM | POA: Diagnosis not present

## 2022-01-07 DIAGNOSIS — I4821 Permanent atrial fibrillation: Secondary | ICD-10-CM | POA: Diagnosis not present

## 2022-01-07 DIAGNOSIS — Z5986 Financial insecurity: Secondary | ICD-10-CM

## 2022-01-07 LAB — POCT GLYCOSYLATED HEMOGLOBIN (HGB A1C): HbA1c, POC (controlled diabetic range): 9 % — AB (ref 0.0–7.0)

## 2022-01-07 MED ORDER — APIXABAN 5 MG PO TABS: 5.0000 mg | ORAL_TABLET | Freq: Two times a day (BID) | ORAL | 0 refills | Status: DC

## 2022-01-07 MED ORDER — GABAPENTIN 300 MG PO CAPS: 300.0000 mg | ORAL_CAPSULE | Freq: Every day | ORAL | 3 refills | Status: DC

## 2022-01-07 MED ORDER — NOVOLOG FLEXPEN 100 UNIT/ML ~~LOC~~ SOPN: PEN_INJECTOR | SUBCUTANEOUS | 11 refills | Status: DC

## 2022-01-07 MED ORDER — TRESIBA FLEXTOUCH 200 UNIT/ML ~~LOC~~ SOPN: 20.0000 [IU] | PEN_INJECTOR | Freq: Every day | SUBCUTANEOUS | 3 refills | Status: DC

## 2022-01-07 MED ORDER — FREESTYLE LIBRE 14 DAY SENSOR MISC: 1.0000 | 11 refills | Status: DC

## 2022-01-07 NOTE — Patient Instructions (Addendum)
I refilled your gabapentin and your freestyle libre You diabetes control is not good.  A1C=9.0 today. Increase your long acting insulin tresiba to 20 units every morning. Increase your short acting insulin to 7 units before lunch and 10 units at dinner.   Give the voltaren gel a try on your back.

## 2022-01-07 NOTE — Progress Notes (Signed)
Remote ICD transmission.   

## 2022-01-07 NOTE — Addendum Note (Signed)
Addended by: Douglass Rivers D on: 01/07/2022 11:07 AM   Modules accepted: Level of Service

## 2022-01-08 ENCOUNTER — Other Ambulatory Visit (HOSPITAL_COMMUNITY): Payer: Self-pay

## 2022-01-08 ENCOUNTER — Encounter: Payer: Self-pay | Admitting: Family Medicine

## 2022-01-08 LAB — RENAL FUNCTION PANEL
Albumin: 4 g/dL (ref 3.9–4.9)
BUN/Creatinine Ratio: 9 — ABNORMAL LOW (ref 10–24)
BUN: 24 mg/dL (ref 8–27)
CO2: 25 mmol/L (ref 20–29)
Calcium: 9.4 mg/dL (ref 8.6–10.2)
Chloride: 101 mmol/L (ref 96–106)
Creatinine, Ser: 2.7 mg/dL — ABNORMAL HIGH (ref 0.76–1.27)
Glucose: 215 mg/dL — ABNORMAL HIGH (ref 70–99)
Phosphorus: 3.1 mg/dL (ref 2.8–4.1)
Potassium: 4.7 mmol/L (ref 3.5–5.2)
Sodium: 139 mmol/L (ref 134–144)
eGFR: 25 mL/min/{1.73_m2} — ABNORMAL LOW (ref >59)

## 2022-01-08 NOTE — Assessment & Plan Note (Addendum)
Refill gabapentin.  Encouraged to try voltarin gel.  Avoid oral NSAIDs

## 2022-01-08 NOTE — Progress Notes (Signed)
    SUBJECTIVE:   CHIEF COMPLAINT / HPI:   Multiple issues: Insulin requiring type 2 DM.  Poor control.  A1C =9.0  States he is now taking his insulin regularly.  He is aware of need for DM control to help prevent progression of CKD. QDI:YMEBR 4.  No recent labs or nephro visit.  Denies swelling Low back pain chronic with sciatica.  Knows to not use NSAIDs.  Asks for refill of gabapentin.  Has been given Rx for voltarin gel but has not yet staarted.  Using tylenol.   A fib, no recent cards visit.  Cannot afford apixiban and asks for samples.   See podiatry for DM foot care.    OBJECTIVE:   BP 126/82   Pulse 92   Ht 6' (1.829 m)   Wt 216 lb (98 kg)   SpO2 99%   BMI 29.29 kg/m   VS and wt noted.   Lungs clear Cardiac normal rate.  Seeming regular.  No murmur. Trace peripheral edema.  ASSESSMENT/PLAN:   NICM (nonischemic cardiomyopathy) (HCC) BP controled and euvolemia on exam.  DM not well controled.  Insulin dependent type 2 diabetes mellitus (HCC) Increase both short and long acting insulin.  He is already experiencing long term complications.  Needs tighter control.  Low back pain with right-sided sciatica Refill gabapentin.  Encouraged to try voltarin gel.  Avoid oral NSAIDs  Chronic kidney disease (CKD), stage IV (severe) (HCC) Stable on lab recheck.  Patient cannot afford medications Given eliquis sample     Anthony Resides, MD [place]

## 2022-01-08 NOTE — Assessment & Plan Note (Signed)
Given eliquis sample

## 2022-01-08 NOTE — Assessment & Plan Note (Signed)
Stable on lab recheck.

## 2022-01-08 NOTE — Assessment & Plan Note (Signed)
BP controled and euvolemia on exam.  DM not well controled.

## 2022-01-08 NOTE — Assessment & Plan Note (Signed)
Increase both short and long acting insulin.  He is already experiencing long term complications.  Needs tighter control.

## 2022-01-09 ENCOUNTER — Encounter: Payer: Self-pay | Admitting: Podiatry

## 2022-01-09 ENCOUNTER — Telehealth: Payer: Self-pay

## 2022-01-09 ENCOUNTER — Ambulatory Visit (INDEPENDENT_AMBULATORY_CARE_PROVIDER_SITE_OTHER): Payer: HMO | Admitting: Podiatry

## 2022-01-09 DIAGNOSIS — Z7901 Long term (current) use of anticoagulants: Secondary | ICD-10-CM

## 2022-01-09 DIAGNOSIS — E119 Type 2 diabetes mellitus without complications: Secondary | ICD-10-CM

## 2022-01-09 DIAGNOSIS — N184 Chronic kidney disease, stage 4 (severe): Secondary | ICD-10-CM

## 2022-01-09 DIAGNOSIS — B351 Tinea unguium: Secondary | ICD-10-CM

## 2022-01-09 DIAGNOSIS — M79609 Pain in unspecified limb: Secondary | ICD-10-CM

## 2022-01-09 DIAGNOSIS — M2010 Hallux valgus (acquired), unspecified foot: Secondary | ICD-10-CM

## 2022-01-09 DIAGNOSIS — D689 Coagulation defect, unspecified: Secondary | ICD-10-CM | POA: Diagnosis not present

## 2022-01-09 NOTE — Telephone Encounter (Signed)
Attempted to call patient, voicemail full. Wanted to follow up on patient assistance to Eliquis.   Will call again. Pt will need out of pocket expenses for this year to submit with application.

## 2022-01-09 NOTE — Progress Notes (Signed)
This patient returns to my office for at risk foot care.  This patient requires this care by a professional since this patient will be at risk due to having venous insufficiency, chronic kidney disease stage IV diabetes and coagulation defect.  Patient is taking eliquiss.  This patient is unable to cut nails himself since the patient cannot reach his nails.These nails are painful walking and wearing shoes.  This patient presents for at risk foot care today.  General Appearance  Alert, conversant and in no acute stress.  Vascular  Dorsalis pedis and posterior tibial  pulses are palpable  bilaterally.  Capillary return is within normal limits  bilaterally. Temperature is within normal limits  bilaterally.  Neurologic  Senn-Weinstein monofilament wire test within normal limits  bilaterally. Muscle power within normal limits bilaterally.  Nails Thick disfigured discolored nails with subungual debris  from hallux to fifth toes bilaterally. No evidence of bacterial infection or drainage bilaterally.  Orthopedic  No limitations of motion  feet .  No crepitus or effusions noted.  No bony pathology or digital deformities noted.  Amputation second toe left foot.  Skin  normotropic skin with no porokeratosis noted bilaterally.  No signs of infections or ulcers noted.  Porokeratosis sub 4 left and sub 5th right asymptomatic.  Onychomycosis  Pain in right toes  Pain in left toes  Consent was obtained for treatment procedures.   Mechanical debridement of nails 1-5  Right and 1,3-5 left foot. performed with a nail nipper.  Filed with dremel without incident.    Return office visit   9  weeks                   Told patient to return for periodic foot care and evaluation due to potential at risk complications.   Gardiner Barefoot DPM

## 2022-01-10 ENCOUNTER — Ambulatory Visit: Payer: HMO

## 2022-01-10 DIAGNOSIS — I428 Other cardiomyopathies: Secondary | ICD-10-CM

## 2022-01-11 LAB — CUP PACEART REMOTE DEVICE CHECK
Battery Remaining Longevity: 3 mo
Battery Voltage: 2.81 V
Brady Statistic AP VP Percent: 0 %
Brady Statistic AP VS Percent: 0 %
Brady Statistic AS VP Percent: 0 %
Brady Statistic AS VS Percent: 0 %
Brady Statistic RA Percent Paced: 0 %
Brady Statistic RV Percent Paced: 99.33 %
Date Time Interrogation Session: 20230818112718
HighPow Impedance: 71 Ohm
Implantable Lead Implant Date: 20190301
Implantable Lead Implant Date: 20190301
Implantable Lead Location: 753858
Implantable Lead Location: 753860
Implantable Pulse Generator Implant Date: 20190301
Lead Channel Impedance Value: 1140 Ohm
Lead Channel Impedance Value: 1178 Ohm
Lead Channel Impedance Value: 1178 Ohm
Lead Channel Impedance Value: 1178 Ohm
Lead Channel Impedance Value: 1178 Ohm
Lead Channel Impedance Value: 1197 Ohm
Lead Channel Impedance Value: 313.212
Lead Channel Impedance Value: 326.029
Lead Channel Impedance Value: 326.029
Lead Channel Impedance Value: 336.648
Lead Channel Impedance Value: 336.648
Lead Channel Impedance Value: 4047 Ohm
Lead Channel Impedance Value: 418 Ohm
Lead Channel Impedance Value: 513 Ohm
Lead Channel Impedance Value: 608 Ohm
Lead Channel Impedance Value: 646 Ohm
Lead Channel Impedance Value: 703 Ohm
Lead Channel Impedance Value: 703 Ohm
Lead Channel Pacing Threshold Amplitude: 0.625 V
Lead Channel Pacing Threshold Amplitude: 3.5 V
Lead Channel Pacing Threshold Pulse Width: 0.4 ms
Lead Channel Pacing Threshold Pulse Width: 1.5 ms
Lead Channel Sensing Intrinsic Amplitude: 11 mV
Lead Channel Sensing Intrinsic Amplitude: 11 mV
Lead Channel Setting Pacing Amplitude: 2.5 V
Lead Channel Setting Pacing Amplitude: 3.75 V
Lead Channel Setting Pacing Pulse Width: 0.4 ms
Lead Channel Setting Pacing Pulse Width: 1.5 ms
Lead Channel Setting Sensing Sensitivity: 0.3 mV

## 2022-01-14 NOTE — Telephone Encounter (Signed)
Spoke with pt regarding necessary paperwork needed for BMS application for Eliquis. Pt will get out of pocket printout from pharmacy of how much he's spent this year. Pt ok with me filling out and signing application on his behalf in meantime.

## 2022-01-15 ENCOUNTER — Other Ambulatory Visit (HOSPITAL_COMMUNITY): Payer: Self-pay

## 2022-02-05 ENCOUNTER — Telehealth: Payer: Self-pay | Admitting: Pharmacist

## 2022-02-05 DIAGNOSIS — I4821 Permanent atrial fibrillation: Secondary | ICD-10-CM

## 2022-02-05 MED ORDER — APIXABAN 5 MG PO TABS: 5.0000 mg | ORAL_TABLET | Freq: Two times a day (BID) | ORAL | 3 refills | Status: DC

## 2022-02-05 NOTE — Telephone Encounter (Signed)
Patient called requesting refill request for Eliquis '5mg'$  BID for Afib States only has 1 remaining.   New prescription provided.  90 day supply plus 3 refills

## 2022-02-05 NOTE — Telephone Encounter (Signed)
Noted and agree. 

## 2022-02-07 NOTE — Addendum Note (Signed)
Addended by: Cheri Kearns A on: 02/07/2022 10:10 AM   Modules accepted: Level of Service

## 2022-02-07 NOTE — Progress Notes (Deleted)
Cardiology Office Note:    Date:  02/18/2022   ID:  Anthony [name] [birth year], MRN [id number]  PCP:  Zenia Resides, MD   [facility] Providers Cardiologist:  Freada Bergeron, MD Electrophysiologist:  Virl Axe, MD  { Referring MD: Zenia Resides, MD   History of Present Illness:    Anthony [name] is a 71 y.o. male with a hx of atrial fibrillation, AV ablation 07/2017 with PM/ICD placement, CAD s/p PCI in 2013 of circumflex lesion extending into second obtuse marginal branch with DES, hypertension, CKD 4, chronic systolic and diastolic heart failure, OSA who was previously followed by Dr. Meda Coffee who presents to clinic for follow-up.  Previous cardiac catheterization in 2013 with DES to circumflex extending into second obtuse marginal branch. Myoview 2007 with no ischemia. Echocardiogram 2018 LVEF 25 to 30% improved in 2019 to 55-60%. Previous testing for amyloid with negative PYP.  Seen by Dr. Meda Coffee 06/2020 with no edema, orthopnea, PND.  No changes were made at that time.  Hospitalized 01/2021 with leg weakness which normalized and neurology was consulted.     He saw Dr. Caryl Comes 03/26/2021 noted marked dyspnea on exertion.  He was compliant with CPAP.  His same regimen was continued.  He was seen 04/16/2021. Noted to have elevated OptiVol level that morning by ICD check.  He noted dyspnea and lower extremity edema but no orthopnea nor PND.  He was recommended to start Lasix 40 mg daily as well as potassium.  He was last seen in clinic 07/27/21 where he was doing well from a CV standpoint.  Today, ***  Past Medical History:  Diagnosis Date   Arthritis    Benign neoplasm of descending colon    Benign neoplasm of sigmoid colon    Benign neoplasm of transverse colon    CAD in native artery    a. reported MI 2000, 2001, s/p stenting of the circumflex lesion extending into the second obtuse marginal branch with a drug-eluting stent in 2003. b. low risk nuc 2015.    Chronic atrial fibrillation (HCC)    Chronic combined systolic and diastolic CHF (congestive heart failure) ([place])    a. Previously diastolic, then EF 86-76% in 10/2016.   CKD (chronic kidney disease), stage III (HCC)    Depression    Diabetes mellitus    Edema of extremities    Erectile dysfunction    GERD (gastroesophageal reflux disease)    Hyperlipidemia    Hypertension    Myocardial infarction ([place]) 2000&2001   Obesity    Onychomycosis    OSA (obstructive sleep apnea)    S/P ICD (internal cardiac defibrillator) procedure and BiV device,  07/25/17 Med[employer]onic  07/26/2017   Seizures ([place])    Sleep apnea    Stroke ([place])    Tubular adenoma of colon 10/2002    Past Surgical History:  Procedure Laterality Date   ANGIOPLASTY  2001   stent x 1   AV NODE ABLATION N/A 08/20/2017   Procedure: AV NODE ABLATION;  Surgeon: Deboraha Sprang, MD;  Location: [place] [facility];  Service: Cardiovascular;  Laterality: N/A;   BIV ICD INSERTION CRT-D N/A 07/25/2017   Procedure: BIV ICD INSERTION CRT-D;  Surgeon: Deboraha Sprang, MD;  Location: [place] [facility];  Service: Cardiovascular;  Laterality: N/A;   COLONOSCOPY  2000?   negative   COLONOSCOPY WITH PROPOFOL N/A 02/08/2019   Procedure: COLONOSCOPY WITH PROPOFOL;  Surgeon: Ladene Artist, MD;  Location: [facility];  Service: Endoscopy;  Laterality: N/A;   FOOT ARTHROTOMY Right    POLYPECTOMY  02/08/2019   Procedure: POLYPECTOMY;  Surgeon: Ladene Artist, MD;  Location: [facility];  Service: Endoscopy;;   TOE AMPUTATION Left 2012    Current Medications: Current Meds  Medication Sig   acetaminophen (TYLENOL) 650 MG CR tablet Take 1,300 mg by mouth 2 (two) times daily as needed for pain.   apixaban (ELIQUIS) 5 MG TABS tablet Take 1 tablet (5 mg total) by mouth 2 (two) times daily.   calcitRIOL (ROCALTROL) 0.5 MCG capsule Take 0.5 mcg by mouth daily at 12 noon.   carvedilol (COREG) 25 MG tablet TAKE 1 TABLET 2 TIMES DAILY. PLEASE MAKE APPT  WITH DR. Johney Frame BEFORE ANYMORE REFILLS.   Continuous Blood Gluc Receiver (FREESTYLE LIBRE 14 DAY READER) DEVI Apply topically as directed.   Continuous Blood Gluc Sensor (FREESTYLE LIBRE 14 DAY SENSOR) MISC 1 Device by Does not apply route every 14 (fourteen) days.   diclofenac Sodium (VOLTAREN) 1 % GEL Apply 4 g topically 4 (four) times daily.   Dulaglutide (TRULICITY) 4.5 NF/6.2ZH SOPN Inject 4.5 mg as directed once a week.   furosemide (LASIX) 40 MG tablet Take 1 tablet (40 mg total) by mouth daily.   gabapentin (NEURONTIN) 300 MG capsule Take 1 capsule (300 mg total) by mouth at bedtime.   hydrALAZINE (APRESOLINE) 50 MG tablet Take 1 tablet (50 mg total) by mouth 3 (three) times daily.   insulin aspart (NOVOLOG FLEXPEN) 100 UNIT/ML FlexPen Inject 7 units before lunch and 10 units before dinner.   insulin degludec (TRESIBA FLEXTOUCH) 200 UNIT/ML FlexTouch Pen Inject 20 Units into the skin daily.   Insulin Pen Needle (PEN NEEDLES) 32G X 4 MM MISC 1 Device by Does not apply route as directed.   Lancets (ONETOUCH ULTRASOFT) lancets Use as instructed   loratadine (CLARITIN) 10 MG tablet Take 10 mg by mouth daily as needed for allergies.   losartan (COZAAR) 25 MG tablet Take 1 tablet (25 mg total) by mouth daily.   Multiple Vitamin (MULTIVITAMIN) tablet Take 1 tablet by mouth daily.     nitroGLYCERIN (NITROSTAT) 0.4 MG SL tablet Place 1 tablet (0.4 mg total) under the tongue every 5 (five) minutes as needed for chest pain. If no relief by 3rd tab, call 911   omeprazole (PRILOSEC) 40 MG capsule TAKE ONE CAPSULE BY MOUTH DAILY BEFORE SUPPER   oxymorphone (OPANA) 5 MG tablet Take 10-15 mg by mouth in the morning and at bedtime. Per Dr Brien Few   PARoxetine (PAXIL) 30 MG tablet Take 1 tablet (30 mg total) by mouth daily.   potassium chloride SA (KLOR-CON M20) 20 MEQ tablet Take 1 tablet (20 mEq total) by mouth daily.   rosuvastatin (CRESTOR) 20 MG tablet TAKE 1 TABLET BY MOUTH EVERY DAY   tamsulosin  (FLOMAX) 0.4 MG CAPS capsule TAKE 1 CAPSULE BY MOUTH EVERYDAY AT BEDTIME     Allergies:   Enalapril maleate   Social History   Socioeconomic History   Marital status: Married    Spouse name: [name]   Number of children: 2   Years of education: GED   Highest education level: Not on file  Occupational History   Occupation: CUSTOMER SERVICES    Employer: [employer]    Comment: [employer]  Tobacco Use   Smoking status: Former    Packs/day: 0.25    Years: 45.00    Total pack years: 11.25    Types: Cigarettes  Start date: 05/28/1967    Quit date: 10/28/2018    Years since quitting: 3.3   Smokeless tobacco: Never  Vaping Use   Vaping Use: Never used  Substance and Sexual Activity   Alcohol use: No    Comment: quit in 1993   Drug use: No   Sexual activity: Not on file  Other Topics Concern   Not on file  Social History Narrative   Patient is right handed, consumes caffeine rarely. Patient resides in home with wife.   Social Determinants of Health   Financial Resource Strain: Medium Risk (10/12/2018)   Overall Financial Resource Strain (CARDIA)    Difficulty of Paying Living Expenses: Somewhat hard  Food Insecurity: Not on file  Transportation Needs: No Transportation Needs (10/12/2018)   PRAPARE - Hydrologist (Medical): No    Lack of Transportation (Non-Medical): No  Physical Activity: Not on file  Stress: Not on file  Social Connections: Not on file     Family History: The patient's family history includes CVA in his mother; Diabetes in his mother; Heart attack in his father and mother. There is no history of Colon cancer or Stomach cancer.  ROS:   Review of Systems  Constitutional:  Negative for chills and fever.  HENT:  Negative for nosebleeds.   Eyes:  Negative for double vision and pain.  Respiratory:  Negative for cough and shortness of breath.   Gastrointestinal:  Negative for constipation and vomiting.  Genitourinary:  Negative for  frequency.  Musculoskeletal:  Negative for falls.  Neurological:  Negative for dizziness and loss of consciousness.  Endo/Heme/Allergies:  Negative for polydipsia.  Psychiatric/Behavioral:  Negative for depression. The patient does not have insomnia.      EKGs/Labs/Other Studies Reviewed:    The following studies were reviewed today:  Carotid duplex 01/2021 Right Carotid: The extracranial vessels were near-normal with only minimal wall thickening or plaque.   Left Carotid: The extracranial vessels were near-normal with only minimal  wall thickening or plaque.   Vertebrals:  Bilateral vertebral arteries demonstrate antegrade flow.  Subclavians: Normal flow hemodynamics were seen in bilateral subclavian arteries.    Echo 01/2021  1. Left ventricular ejection fraction, by estimation, is 60 to 65%. The  left ventricle has normal function. The left ventricle has no regional  wall motion abnormalities. There is mild left ventricular hypertrophy.  Left ventricular diastolic parameters are indeterminate.   2. Pacing wires in RA/RV. Right ventricular systolic function is normal.  The right ventricular size is normal.   3. Left atrial size was moderately dilated.   4. The mitral valve is abnormal. Trivial mitral valve regurgitation. No  evidence of mitral stenosis.   5. The aortic valve was not well visualized. There is mild calcification  of the aortic valve. Aortic valve regurgitation is not visualized. Mild  aortic valve sclerosis is present, with no evidence of aortic valve  stenosis.   6. The inferior vena cava is normal in size with greater than 50%  respiratory variability, suggesting right atrial pressure of 3 mmHg.   Lexiscan Myoview 03/17/2020: The left ventricular ejection fraction is mildly decreased (45-54%). There was no ST segment deviation noted during stress. The study is normal. This is a low risk study. Nuclear stress EF: 53%.   No ischemia or infarction on  perfusion images.   EKG:  EKG is personally reviewed.  07/27/2021: EKG was not ordered. 04/16/2021: Ventricular paced rhythm, rate 71 bpm.  Recent Labs: 10/03/2021:  BNP 110.7; Hemoglobin 12.6; Platelets 126 01/07/2022: BUN 24; Creatinine, Ser 2.70; Potassium 4.7; Sodium 139   Recent Lipid Panel    Component Value Date/Time   CHOL 128 07/27/2021 0955   TRIG 103 07/27/2021 0955   HDL 39 (L) 07/27/2021 0955   CHOLHDL 3.3 07/27/2021 0955   CHOLHDL 3.2 02/28/2016 0906   VLDL 19 02/28/2016 0906   LDLCALC 70 07/27/2021 0955   LDLDIRECT 58 11/06/2011 1434     Risk Assessment/Calculations:           Physical Exam:    VS:  BP 124/72   Pulse 72   Ht 6' (1.829 m)   Wt 218 lb (98.9 kg)   SpO2 97%   BMI 29.57 kg/m     Wt Readings from Last 3 Encounters:  02/18/22 218 lb (98.9 kg)  01/07/22 216 lb (98 kg)  12/13/21 220 lb (99.8 kg)     GEN: Well nourished, well developed in no acute distress HEENT: Normal NECK: No JVD; No carotid bruits CARDIAC: RRR, no murmurs, rubs, gallops RESPIRATORY:  Clear to auscultation without rales, wheezing or rhonchi  ABDOMEN: Soft, non-tender, non-distended MUSCULOSKELETAL:  No edema; No deformity  SKIN: Warm and dry NEUROLOGIC:  Alert and oriented x 3 PSYCHIATRIC:  Normal affect   ASSESSMENT:    1. DOE (dyspnea on exertion)   2. ICD (implantable cardioverter-defibrillator), biventricular, in situ   3. Decreased cardiac ejection fraction    PLAN:    In order of problems listed above:  #CAD s/p to PCI Lcx: Doing well without anginal symptoms. -Continue crestor '20mg'$  daily -Continue losartan '25mg'$  daily -Continue coreg '25mg'$  BID  #Chronic HFpEF: #Nonischemic CM: LVEF 25-30% in 2018 improved to 55-60% in 2019. Myoview 2007 with no ischemia. He was evaluated for possible cardiac amyloidosis with negative PYP scan. Doing well and euvolemic on exam.  -Continue lasix '40mg'$  daily -Continue losartan '25mg'$  daily -Continue coreg '25mg'$   BID -Continue hydralazine '50mg'$  TID  #Permanent Afib s/p AVN ablation: #Hypercoaguable state: CHADS-vasc 7. Followed by Dr. Caryl Comes. Tolerating AC without issues.  -Continue apixaban '5mg'$  BID -Continue coreg '25mg'$  BID  #Severe LVH: PYP scan negative for amyloid. -Continue BP medications  #HTN: Well controlled and at goal <130/90. -Continue losartan '25mg'$  daily -Continue coreg '25mg'$  BID -Continue hydralazine '50mg'$  TID  #HLD: -Continue crestor '20mg'$  daily -LDL well controlled at 70 on 07/2021  #DMII on Insulin: -Management per PCP -A1C above goal at 9.0         Follow-up:   6 months.  Medication Adjustments/Labs and Tests Ordered: Current medicines are reviewed at length with the patient today.  Concerns regarding medicines are outlined above.   No orders of the defined types were placed in this encounter.  No orders of the defined types were placed in this encounter.  There are no Patient Instructions on file for this visit.    I,Mathew Stumpf,acting as a Education administrator for Freada Bergeron, MD.,have documented all relevant documentation on the behalf of Freada Bergeron, MD,as directed by  Freada Bergeron, MD while in the presence of Freada Bergeron, MD.  I, Freada Bergeron, MD, have reviewed all documentation for this visit. The documentation on 02/18/22 for the exam, diagnosis, procedures, and orders are all accurate and complete.   Signed, Freada Bergeron, MD  02/18/2022 2:56 PM    [place]

## 2022-02-07 NOTE — Progress Notes (Signed)
Remote ICD transmission.   

## 2022-02-10 LAB — CUP PACEART REMOTE DEVICE CHECK
Battery Remaining Longevity: 3 mo
Battery Voltage: 2.78 V
Brady Statistic AP VP Percent: 0 %
Brady Statistic AP VS Percent: 0 %
Brady Statistic AS VP Percent: 0 %
Brady Statistic AS VS Percent: 0 %
Brady Statistic RA Percent Paced: 0 %
Brady Statistic RV Percent Paced: 99.52 %
Date Time Interrogation Session: 20230914033325
HighPow Impedance: 63 Ohm
Implantable Lead Implant Date: 20190301
Implantable Lead Implant Date: 20190301
Implantable Lead Location: 753858
Implantable Lead Location: 753860
Implantable Pulse Generator Implant Date: 20190301
Lead Channel Impedance Value: 1064 Ohm
Lead Channel Impedance Value: 1083 Ohm
Lead Channel Impedance Value: 1083 Ohm
Lead Channel Impedance Value: 1121 Ohm
Lead Channel Impedance Value: 1121 Ohm
Lead Channel Impedance Value: 1121 Ohm
Lead Channel Impedance Value: 289.049
Lead Channel Impedance Value: 297.365
Lead Channel Impedance Value: 301.328
Lead Channel Impedance Value: 313.212
Lead Channel Impedance Value: 317.612
Lead Channel Impedance Value: 399 Ohm
Lead Channel Impedance Value: 4047 Ohm
Lead Channel Impedance Value: 456 Ohm
Lead Channel Impedance Value: 551 Ohm
Lead Channel Impedance Value: 608 Ohm
Lead Channel Impedance Value: 646 Ohm
Lead Channel Impedance Value: 665 Ohm
Lead Channel Pacing Threshold Amplitude: 0.625 V
Lead Channel Pacing Threshold Amplitude: 3.25 V
Lead Channel Pacing Threshold Pulse Width: 0.4 ms
Lead Channel Pacing Threshold Pulse Width: 1.5 ms
Lead Channel Sensing Intrinsic Amplitude: 11 mV
Lead Channel Sensing Intrinsic Amplitude: 11 mV
Lead Channel Setting Pacing Amplitude: 2.5 V
Lead Channel Setting Pacing Amplitude: 3.75 V
Lead Channel Setting Pacing Pulse Width: 0.4 ms
Lead Channel Setting Pacing Pulse Width: 1.5 ms
Lead Channel Setting Sensing Sensitivity: 0.3 mV

## 2022-02-11 ENCOUNTER — Ambulatory Visit (INDEPENDENT_AMBULATORY_CARE_PROVIDER_SITE_OTHER): Payer: HMO

## 2022-02-11 ENCOUNTER — Other Ambulatory Visit: Payer: Self-pay | Admitting: *Deleted

## 2022-02-11 DIAGNOSIS — I428 Other cardiomyopathies: Secondary | ICD-10-CM

## 2022-02-11 MED ORDER — OMEPRAZOLE 40 MG PO CPDR
DELAYED_RELEASE_CAPSULE | ORAL | 3 refills | Status: AC
Start: 2022-02-11 — End: ?

## 2022-02-15 ENCOUNTER — Telehealth: Payer: Self-pay | Admitting: *Deleted

## 2022-02-15 NOTE — Telephone Encounter (Signed)
   Pre-operative Risk Assessment    Patient Name: Anthony [name]  DOB: 1950/10/06 MRN: [id number]      Request for Surgical Clearance    Procedure:   LUMBAR ESI  Date of Surgery:  Clearance TBD                                 Surgeon: NOT LISTED Surgeon's Group or Practice Name:  [facility] Phone number:  [phone] Fax number:  [phone] ATTN: WANDA RICE   Type of Clearance Requested:   - Medical  - Pharmacy:  Hold Apixaban (Eliquis)     Type of Anesthesia:   CLEARANCE STATES NO SEDATION TO BE USED   Additional requests/questions:    Jiles Prows   02/15/2022, 11:23 AM

## 2022-02-18 ENCOUNTER — Encounter: Payer: Self-pay | Admitting: Cardiology

## 2022-02-18 ENCOUNTER — Ambulatory Visit: Payer: HMO | Attending: Cardiology | Admitting: Cardiology

## 2022-02-18 VITALS — BP 124/72 | HR 72 | Ht 72.0 in | Wt 218.0 lb

## 2022-02-18 DIAGNOSIS — Z95810 Presence of automatic (implantable) cardiac defibrillator: Secondary | ICD-10-CM

## 2022-02-18 DIAGNOSIS — I2510 Atherosclerotic heart disease of native coronary artery without angina pectoris: Secondary | ICD-10-CM

## 2022-02-18 DIAGNOSIS — E782 Mixed hyperlipidemia: Secondary | ICD-10-CM

## 2022-02-18 DIAGNOSIS — R0609 Other forms of dyspnea: Secondary | ICD-10-CM | POA: Diagnosis not present

## 2022-02-18 DIAGNOSIS — I5042 Chronic combined systolic (congestive) and diastolic (congestive) heart failure: Secondary | ICD-10-CM

## 2022-02-18 DIAGNOSIS — D6859 Other primary thrombophilia: Secondary | ICD-10-CM | POA: Diagnosis not present

## 2022-02-18 DIAGNOSIS — I428 Other cardiomyopathies: Secondary | ICD-10-CM | POA: Diagnosis not present

## 2022-02-18 DIAGNOSIS — R931 Abnormal findings on diagnostic imaging of heart and coronary circulation: Secondary | ICD-10-CM

## 2022-02-18 DIAGNOSIS — I10 Essential (primary) hypertension: Secondary | ICD-10-CM | POA: Diagnosis not present

## 2022-02-18 MED ORDER — ROSUVASTATIN CALCIUM 20 MG PO TABS: 20.0000 mg | ORAL_TABLET | Freq: Every day | ORAL | 3 refills | Status: DC

## 2022-02-18 MED ORDER — CARVEDILOL 25 MG PO TABS: ORAL_TABLET | ORAL | 3 refills | Status: DC

## 2022-02-18 MED ORDER — LOSARTAN POTASSIUM 25 MG PO TABS: 25.0000 mg | ORAL_TABLET | Freq: Every day | ORAL | 3 refills | Status: DC

## 2022-02-18 MED ORDER — HYDRALAZINE HCL 50 MG PO TABS: 50.0000 mg | ORAL_TABLET | Freq: Three times a day (TID) | ORAL | 3 refills | Status: DC

## 2022-02-18 NOTE — Progress Notes (Signed)
Cardiology Office Note:    Date:  02/18/2022   ID:  Aldridge, Krzyzanowski 1951/02/03, MRN [id number]  PCP:  Zenia Resides, MD   [facility] Providers Cardiologist:  Freada Bergeron, MD Electrophysiologist:  Virl Axe, MD  { Referring MD: Zenia Resides, MD   History of Present Illness:    Anthony [name] is a 71 y.o. male with a hx of atrial fibrillation, AV ablation 07/2017 with PM/ICD placement, CAD s/p PCI in 2013 of circumflex lesion extending into second obtuse marginal branch with DES, hypertension, CKD 4, chronic systolic and diastolic heart failure, OSA who was previously followed by Dr. Meda Coffee who presents to clinic for follow-up.  Previous cardiac catheterization in 2013 with DES to circumflex extending into second obtuse marginal branch. Myoview 2007 with no ischemia.  Echocardiogram 2018 LVEF 25 to 30% improved in 2019 to 55-60%.  Previous testing for amyloid with negative PYP.  Seen by Dr. Meda Coffee 06/2020 with no edema, orthopnea, PND.  No changes were made at that time.  Hospitalized 01/2021 with leg weakness which normalized and neurology was consulted.     He saw Dr. Caryl Comes 03/26/2021 noted marked dyspnea on exertion.  He was compliant with CPAP.  His same regimen was continued.  He was seen 04/16/2021. Noted to have elevated OptiVol level that morning by ICD check.  He noted dyspnea and lower extremity edema but no orthopnea nor PND.  He was recommended to start Lasix 40 mg daily as well as potassium.  He was last seen in clinic 07/27/21 where he was doing well from a CV standpoint.  Today, the patient states that many years ago he had a stroke, so he is now on Eliquis. He will need to be off of Eliquis for a few days for a planned lumbar steroidal injection. Due to his back pain he requires the use of crutches to be able to walk.   Previously he experienced rapid heart palpitations prior to his ablation. At this time he denies feeling any palpitations or rapid  heart rates.  At home he reports well controlled blood pressures. His reading is 124/72 in clinic today, on carvedilol, losartan, and hydralazine.  His LDL was 70 as of 07/2021. He confirms that his last A1c was elevated. He is now being more diligent about his diet and he states his blood glucoses are better controlled.  He continues to work on quitting smoking. Currently he is down to 5-6 cigarettes a day.  He denies any chest pain, shortness of breath, or peripheral edema. No lightheadedness, headaches, syncope, orthopnea, or PND.   Past Medical History:  Diagnosis Date   Arthritis    Benign neoplasm of descending colon    Benign neoplasm of sigmoid colon    Benign neoplasm of transverse colon    CAD in native artery    a. reported MI 2000, 2001, s/p stenting of the circumflex lesion extending into the second obtuse marginal branch with a drug-eluting stent in 2003. b. low risk nuc 2015.   Chronic atrial fibrillation (HCC)    Chronic combined systolic and diastolic CHF (congestive heart failure) ([place])    a. Previously diastolic, then EF 83-66% in 10/2016.   CKD (chronic kidney disease), stage III (HCC)    Depression    Diabetes mellitus    Edema of extremities    Erectile dysfunction    GERD (gastroesophageal reflux disease)    Hyperlipidemia    Hypertension    Myocardial infarction ([place])  2000&2001   Obesity    Onychomycosis    OSA (obstructive sleep apnea)    S/P ICD (internal cardiac defibrillator) procedure and BiV device,  07/25/17 Medtronic  07/26/2017   Seizures ([place])    Sleep apnea    Stroke ([place])    Tubular adenoma of colon 10/2002    Past Surgical History:  Procedure Laterality Date   ANGIOPLASTY  2001   stent x 1   AV NODE ABLATION N/A 08/20/2017   Procedure: AV NODE ABLATION;  Surgeon: Deboraha Sprang, MD;  Location: [place] [facility];  Service: Cardiovascular;  Laterality: N/A;   BIV ICD INSERTION CRT-D N/A 07/25/2017   Procedure: BIV ICD INSERTION CRT-D;   Surgeon: Deboraha Sprang, MD;  Location: [place] [facility];  Service: Cardiovascular;  Laterality: N/A;   COLONOSCOPY  2000?   negative   COLONOSCOPY WITH PROPOFOL N/A 02/08/2019   Procedure: COLONOSCOPY WITH PROPOFOL;  Surgeon: Ladene Artist, MD;  Location: [facility];  Service: Endoscopy;  Laterality: N/A;   FOOT ARTHROTOMY Right    POLYPECTOMY  02/08/2019   Procedure: POLYPECTOMY;  Surgeon: Ladene Artist, MD;  Location: [facility];  Service: Endoscopy;;   TOE AMPUTATION Left 2012    Current Medications: Current Meds  Medication Sig   acetaminophen (TYLENOL) 650 MG CR tablet Take 1,300 mg by mouth 2 (two) times daily as needed for pain.   apixaban (ELIQUIS) 5 MG TABS tablet Take 1 tablet (5 mg total) by mouth 2 (two) times daily.   calcitRIOL (ROCALTROL) 0.5 MCG capsule Take 0.5 mcg by mouth daily at 12 noon.   Continuous Blood Gluc Receiver (FREESTYLE LIBRE 14 DAY READER) DEVI Apply topically as directed.   Continuous Blood Gluc Sensor (FREESTYLE LIBRE 14 DAY SENSOR) MISC 1 Device by Does not apply route every 14 (fourteen) days.   diclofenac Sodium (VOLTAREN) 1 % GEL Apply 4 g topically 4 (four) times daily.   Dulaglutide (TRULICITY) 4.5 IF/0.2DX SOPN Inject 4.5 mg as directed once a week.   furosemide (LASIX) 40 MG tablet Take 1 tablet (40 mg total) by mouth daily.   gabapentin (NEURONTIN) 300 MG capsule Take 1 capsule (300 mg total) by mouth at bedtime.   insulin aspart (NOVOLOG FLEXPEN) 100 UNIT/ML FlexPen Inject 7 units before lunch and 10 units before dinner.   insulin degludec (TRESIBA FLEXTOUCH) 200 UNIT/ML FlexTouch Pen Inject 20 Units into the skin daily.   Insulin Pen Needle (PEN NEEDLES) 32G X 4 MM MISC 1 Device by Does not apply route as directed.   Lancets (ONETOUCH ULTRASOFT) lancets Use as instructed   loratadine (CLARITIN) 10 MG tablet Take 10 mg by mouth daily as needed for allergies.   Multiple Vitamin (MULTIVITAMIN) tablet Take 1 tablet by mouth daily.      nitroGLYCERIN (NITROSTAT) 0.4 MG SL tablet Place 1 tablet (0.4 mg total) under the tongue every 5 (five) minutes as needed for chest pain. If no relief by 3rd tab, call 911   omeprazole (PRILOSEC) 40 MG capsule TAKE ONE CAPSULE BY MOUTH DAILY BEFORE SUPPER   oxymorphone (OPANA) 5 MG tablet Take 10-15 mg by mouth in the morning and at bedtime. Per Dr Brien Few   PARoxetine (PAXIL) 30 MG tablet Take 1 tablet (30 mg total) by mouth daily.   potassium chloride SA (KLOR-CON M20) 20 MEQ tablet Take 1 tablet (20 mEq total) by mouth daily.   tamsulosin (FLOMAX) 0.4 MG CAPS capsule TAKE 1 CAPSULE BY MOUTH EVERYDAY AT BEDTIME   [  DISCONTINUED] carvedilol (COREG) 25 MG tablet TAKE 1 TABLET 2 TIMES DAILY. PLEASE MAKE APPT WITH DR. Johney Frame BEFORE ANYMORE REFILLS.   [DISCONTINUED] hydrALAZINE (APRESOLINE) 50 MG tablet Take 1 tablet (50 mg total) by mouth 3 (three) times daily.   [DISCONTINUED] losartan (COZAAR) 25 MG tablet Take 1 tablet (25 mg total) by mouth daily.   [DISCONTINUED] rosuvastatin (CRESTOR) 20 MG tablet TAKE 1 TABLET BY MOUTH EVERY DAY     Allergies:   Enalapril maleate   Social History   Socioeconomic History   Marital status: Married    Spouse name: Anthony [name]   Number of children: 2   Years of education: GED   Highest education level: Not on file  Occupational History   Occupation: CUSTOMER SERVICES    Employer: [employer]    Comment: [employer]  Tobacco Use   Smoking status: Former    Packs/day: 0.25    Years: 45.00    Total pack years: 11.25    Types: Cigarettes    Start date: 05/28/1967    Quit date: 10/28/2018    Years since quitting: 3.3   Smokeless tobacco: Never  Vaping Use   Vaping Use: Never used  Substance and Sexual Activity   Alcohol use: No    Comment: quit in 1993   Drug use: No   Sexual activity: Not on file  Other Topics Concern   Not on file  Social History Narrative   Patient is right handed, consumes caffeine rarely. Patient resides in home with wife.   Social  Determinants of Health   Financial Resource Strain: Medium Risk (10/12/2018)   Overall Financial Resource Strain (CARDIA)    Difficulty of Paying Living Expenses: Somewhat hard  Food Insecurity: Not on file  Transportation Needs: No Transportation Needs (10/12/2018)   PRAPARE - Hydrologist (Medical): No    Lack of Transportation (Non-Medical): No  Physical Activity: Not on file  Stress: Not on file  Social Connections: Not on file     Family History: The patient's family history includes CVA in his mother; Diabetes in his mother; Heart attack in his father and mother. There is no history of Colon cancer or Stomach cancer.  ROS:   Review of Systems  Constitutional:  Negative for chills and fever.  HENT:  Negative for nosebleeds.   Eyes:  Negative for double vision and pain.  Respiratory:  Negative for cough and shortness of breath.   Cardiovascular:  Negative for chest pain, palpitations, orthopnea, claudication, leg swelling and PND.  Gastrointestinal:  Negative for constipation and vomiting.  Genitourinary:  Negative for frequency.  Musculoskeletal:  Positive for back pain. Negative for falls.  Neurological:  Negative for dizziness and loss of consciousness.  Endo/Heme/Allergies:  Negative for polydipsia.  Psychiatric/Behavioral:  Negative for depression. The patient does not have insomnia.      EKGs/Labs/Other Studies Reviewed:    The following studies were reviewed today:  Carotid duplex 01/2021 Right Carotid: The extracranial vessels were near-normal with only minimal wall thickening or plaque.   Left Carotid: The extracranial vessels were near-normal with only minimal  wall thickening or plaque.   Vertebrals:  Bilateral vertebral arteries demonstrate antegrade flow.  Subclavians: Normal flow hemodynamics were seen in bilateral subclavian arteries.    Echo 01/2021  1. Left ventricular ejection fraction, by estimation, is 60 to 65%. The   left ventricle has normal function. The left ventricle has no regional  wall motion abnormalities. There is mild left ventricular hypertrophy.  Left ventricular diastolic parameters are indeterminate.   2. Pacing wires in RA/RV. Right ventricular systolic function is normal.  The right ventricular size is normal.   3. Left atrial size was moderately dilated.   4. The mitral valve is abnormal. Trivial mitral valve regurgitation. No  evidence of mitral stenosis.   5. The aortic valve was not well visualized. There is mild calcification  of the aortic valve. Aortic valve regurgitation is not visualized. Mild  aortic valve sclerosis is present, with no evidence of aortic valve  stenosis.   6. The inferior vena cava is normal in size with greater than 50%  respiratory variability, suggesting right atrial pressure of 3 mmHg.   Lexiscan Myoview 03/17/2020: The left ventricular ejection fraction is mildly decreased (45-54%). There was no ST segment deviation noted during stress. The study is normal. This is a low risk study. Nuclear stress EF: 53%.   No ischemia or infarction on perfusion images.   EKG:  EKG is personally reviewed.  02/18/2022: EKG was not ordered. 07/27/2021: EKG was not ordered. 04/16/2021: Ventricular paced rhythm, rate 71 bpm.  Recent Labs: 10/03/2021: BNP 110.7; Hemoglobin 12.6; Platelets 126 01/07/2022: BUN 24; Creatinine, Ser 2.70; Potassium 4.7; Sodium 139   Recent Lipid Panel    Component Value Date/Time   CHOL 128 07/27/2021 0955   TRIG 103 07/27/2021 0955   HDL 39 (L) 07/27/2021 0955   CHOLHDL 3.3 07/27/2021 0955   CHOLHDL 3.2 02/28/2016 0906   VLDL 19 02/28/2016 0906   LDLCALC 70 07/27/2021 0955   LDLDIRECT 58 11/06/2011 1434     Risk Assessment/Calculations:           Physical Exam:    VS:  BP 124/72   Pulse 72   Ht 6' (1.829 m)   Wt 218 lb (98.9 kg)   SpO2 97%   BMI 29.57 kg/m     Wt Readings from Last 3 Encounters:  02/18/22 218 lb  (98.9 kg)  01/07/22 216 lb (98 kg)  12/13/21 220 lb (99.8 kg)     GEN: Well nourished, well developed in no acute distress HEENT: Normal NECK: No JVD; No carotid bruits CARDIAC: RRR, no murmurs, rubs, gallops RESPIRATORY:  Clear to auscultation without rales, wheezing or rhonchi  ABDOMEN: Soft, non-tender, non-distended MUSCULOSKELETAL:  No edema; No deformity  SKIN: Warm and dry NEUROLOGIC:  Alert and oriented x 3 PSYCHIATRIC:  Normal affect   ASSESSMENT:    1. Coronary artery disease involving native coronary artery of native heart without angina pectoris   2. DOE (dyspnea on exertion)   3. ICD (implantable cardioverter-defibrillator), biventricular, in situ   4. Decreased cardiac ejection fraction   5. HYPERTENSION, BENIGN SYSTEMIC   6. Primary hypertension   7. Chronic combined systolic and diastolic heart failure ([place])   8. Essential hypertension   9. Mixed hyperlipidemia   10. NICM (nonischemic cardiomyopathy) ([place])   11. Hypercoagulable state ([place])    PLAN:    In order of problems listed above:  #CAD s/p to PCI Lcx: Doing well without anginal symptoms. -Continue crestor '20mg'$  daily -Continue losartan '25mg'$  daily -Continue coreg '25mg'$  BID   #Chronic HFpEF: #Nonischemic CM: #S/p ICD placement LVEF 25-30% in 2018 improved to 55-60% in 2019. Myoview 2007 with no ischemia. He was evaluated for possible cardiac amyloidosis with negative PYP scan. Doing well and euvolemic on exam.  -Continue lasix '40mg'$  daily -Continue losartan '25mg'$  daily -Continue coreg '25mg'$  BID -Continue hydralazine '50mg'$  TID -Follows with Dr. Caryl Comes for  ICD; ERI in 10month   #Permanent Afib s/p AVN ablation: #Hypercoaguable state: CHADS-vasc 7. Followed by Dr. KCaryl Comes Tolerating AC without issues.  -Continue apixaban '5mg'$  BID -Continue coreg '25mg'$  BID   #Severe LVH: PYP scan negative for amyloid. -Continue BP medications   #HTN: Well controlled and at goal <130/90. -Continue losartan '25mg'$   daily -Continue coreg '25mg'$  BID -Continue hydralazine '50mg'$  TID   #HLD: -Continue crestor '20mg'$  daily -LDL well controlled at 70 on 07/2021   #DMII on Insulin: -Management per PCP -A1C above goal at 9.0 -Working on diet and has been compliant with insulin      Follow-up:   6 months  Medication Adjustments/Labs and Tests Ordered: Current medicines are reviewed at length with the patient today.  Concerns regarding medicines are outlined above.   No orders of the defined types were placed in this encounter.  Meds ordered this encounter  Medications   carvedilol (COREG) 25 MG tablet    Sig: TAKE 1 TABLET 2 TIMES DAILY.    Dispense:  180 tablet    Refill:  3    DX Code I48.21 and I42.8   rosuvastatin (CRESTOR) 20 MG tablet    Sig: Take 1 tablet (20 mg total) by mouth daily.    Dispense:  90 tablet    Refill:  3   hydrALAZINE (APRESOLINE) 50 MG tablet    Sig: Take 1 tablet (50 mg total) by mouth 3 (three) times daily.    Dispense:  270 tablet    Refill:  3   losartan (COZAAR) 25 MG tablet    Sig: Take 1 tablet (25 mg total) by mouth daily.    Dispense:  90 tablet    Refill:  3   Patient Instructions  Medication Instructions:   Your physician recommends that you continue on your current medications as directed. Please refer to the Current Medication list given to you today.  *If you need a refill on your cardiac medications before your next appointment, please call your pharmacy*   Follow-Up: [facility] and your health needs are our priority.  As part of our continuing mission to provide you with exceptional heart care, we have created designated Provider Care Teams.  These Care Teams include your primary Cardiologist (physician) and Advanced Practice Providers (APPs -  Physician Assistants and Nurse Practitioners) who all work together to provide you with the care you need, when you need it.  We recommend signing up for the patient portal called  "MyChart".  Sign up information is provided on this After Visit Summary.  MyChart is used to connect with patients for Virtual Visits (Telemedicine).  Patients are able to view lab/test results, encounter notes, upcoming appointments, etc.  Non-urgent messages can be sent to your provider as well.   To learn more about what you can do with MyChart, go to [email or web address]    Your next appointment:   6 month(s)  The format for your next appointment:   In Person  Provider:   HFreada Bergeron MD      Important Information About Sugar          I,Mathew Stumpf,acting as a scribe for HFreada Bergeron MD.,have documented all relevant documentation on the behalf of HFreada Bergeron MD,as directed by  HFreada Bergeron MD while in the presence of HFreada Bergeron MD.  I, HFreada Bergeron MD, have reviewed all documentation for this visit. The documentation on 02/18/22 for the exam, diagnosis, procedures, and  orders are all accurate and complete.   Signed, Freada Bergeron, MD  02/18/2022 3:22 PM    [facility]

## 2022-02-18 NOTE — Patient Instructions (Signed)
Medication Instructions:   Your physician recommends that you continue on your current medications as directed. Please refer to the Current Medication list given to you today.  *If you need a refill on your cardiac medications before your next appointment, please call your pharmacy*   Follow-Up: At [facility], you and your health needs are our priority.  As part of our continuing mission to provide you with exceptional heart care, we have created designated Provider Care Teams.  These Care Teams include your primary Cardiologist (physician) and Advanced Practice Providers (APPs -  Physician Assistants and Nurse Practitioners) who all work together to provide you with the care you need, when you need it.  We recommend signing up for the patient portal called "MyChart".  Sign up information is provided on this After Visit Summary.  MyChart is used to connect with patients for Virtual Visits (Telemedicine).  Patients are able to view lab/test results, encounter notes, upcoming appointments, etc.  Non-urgent messages can be sent to your provider as well.   To learn more about what you can do with MyChart, go to [email or web address].    Your next appointment:   6 month(s)  The format for your next appointment:   In Person  Provider:   Heather E Pemberton, MD     Important Information About Sugar       

## 2022-02-18 NOTE — Telephone Encounter (Signed)
Patient with diagnosis of A Fib on Eliquis for anticoagulation.    Procedure: Lumbar ESI Date of procedure: TBD   CHA2DS2-VASc Score = 5  This indicates a 7.2% annual risk of stroke. The patient's score is based upon: CHF History: 1 HTN History: 1 Diabetes History: 1 Stroke History: 0 Vascular Disease History: 1 Age Score: 1 Gender Score: 0   CrCl 35 mL/min Platelet count 126K   Per office protocol, patient can hold Eliquis  for 3 days prior to procedure.     **This guidance is not considered finalized until pre-operative APP has relayed final recommendations.**

## 2022-02-20 ENCOUNTER — Telehealth: Payer: Self-pay | Admitting: Cardiology

## 2022-02-20 ENCOUNTER — Encounter: Payer: Self-pay | Admitting: Pharmacist

## 2022-02-20 DIAGNOSIS — I639 Cerebral infarction, unspecified: Secondary | ICD-10-CM | POA: Insufficient documentation

## 2022-02-20 NOTE — Telephone Encounter (Signed)
Patient called to follow-up on holding his apixaban (ELIQUIS) 5 MG TABS tablet for ortho injection in back.

## 2022-02-20 NOTE — Telephone Encounter (Addendum)
Patient with diagnosis of A Fib on Eliquis for anticoagulation.    Procedure: Lumbar ESI Date of procedure: TBD   CHA2DS2-VASc Score = 7  This indicates a 11.2% annual risk of stroke. The patient's score is based upon: CHF History: 1 HTN History: 1 Diabetes History: 1 Stroke History: 2 Vascular Disease History: 1 Age Score: 1 Gender Score: 0    CrCl 35 mL/min Platelet count 126K  Per office protocol, patient can hold Eliquis for 3 days prior to procedure.    Patient WILL need bridging with Lovenox (enoxaparin) around procedure.  **This guidance is not considered finalized until pre-operative APP has relayed final recommendations.**

## 2022-02-20 NOTE — Telephone Encounter (Addendum)
   Patient Name: Anthony [name]  DOB: 11-09-1950 MRN: [id number]  Primary Cardiologist: Freada Bergeron, MD  Chart reviewed as part of pre-operative protocol coverage. Given past medical history and time since last visit, based on ACC/AHA guidelines, Anthony [name] would be at acceptable risk for the planned procedure without further cardiovascular testing.    Patient with diagnosis of A Fib on Eliquis for anticoagulation.     Procedure: Lumbar ESI Date of procedure: TBD     CHA2DS2-VASc Score = 7  This indicates a 11.2% annual risk of stroke. The patient's score is based upon: CHF History: 1 HTN History: 1 Diabetes History: 1 Stroke History: 2 Vascular Disease History: 1 Age Score: 1 Gender Score: 0     CrCl 35 mL/min Platelet count 126K   Per office protocol, patient can hold Eliquis for 3 days prior to procedure.     Patient WILL need bridging with Lovenox (enoxaparin) around procedure (revised recommendations). Patient was advised to contact the Coumadin clinic once his procedure is scheduled to coordinate Lovenox bridge/training.   I will route this recommendation to the requesting party via Epic fax function and remove from pre-op pool.  Please call with questions.  Lenna Sciara, NP 02/20/2022, 12:24 PM

## 2022-02-20 NOTE — Telephone Encounter (Addendum)
   Patient Name: Anthony [name]  DOB: [birth year] MRN: [id number]  Primary Cardiologist: Freada Bergeron, MD  Chart reviewed as part of pre-operative protocol coverage. Given past medical history and time since last visit, based on ACC/AHA guidelines, REZNOR FERRANDO would be at acceptable risk for the planned procedure without further cardiovascular testing.   Per office protocol, patient can hold Eliquis  for 3 days prior to procedure.  I will route this recommendation to the requesting party via Epic fax function and remove from pre-op pool.  Please call with questions.  Lenna Sciara, NP 02/20/2022, 10:59 AM

## 2022-02-26 NOTE — Progress Notes (Signed)
Remote ICD transmission.   

## 2022-03-04 DIAGNOSIS — N2581 Secondary hyperparathyroidism of renal origin: Secondary | ICD-10-CM | POA: Diagnosis not present

## 2022-03-04 DIAGNOSIS — Z794 Long term (current) use of insulin: Secondary | ICD-10-CM | POA: Diagnosis not present

## 2022-03-04 DIAGNOSIS — E119 Type 2 diabetes mellitus without complications: Secondary | ICD-10-CM | POA: Diagnosis not present

## 2022-03-04 DIAGNOSIS — Z89422 Acquired absence of other left toe(s): Secondary | ICD-10-CM | POA: Diagnosis not present

## 2022-03-04 DIAGNOSIS — N189 Chronic kidney disease, unspecified: Secondary | ICD-10-CM | POA: Diagnosis not present

## 2022-03-04 DIAGNOSIS — Z7985 Long-term (current) use of injectable non-insulin antidiabetic drugs: Secondary | ICD-10-CM | POA: Diagnosis not present

## 2022-03-04 DIAGNOSIS — I10 Essential (primary) hypertension: Secondary | ICD-10-CM | POA: Diagnosis not present

## 2022-03-04 DIAGNOSIS — N184 Chronic kidney disease, stage 4 (severe): Secondary | ICD-10-CM | POA: Diagnosis not present

## 2022-03-13 ENCOUNTER — Ambulatory Visit (INDEPENDENT_AMBULATORY_CARE_PROVIDER_SITE_OTHER): Payer: HMO

## 2022-03-13 DIAGNOSIS — Z23 Encounter for immunization: Secondary | ICD-10-CM | POA: Diagnosis not present

## 2022-03-14 ENCOUNTER — Ambulatory Visit (INDEPENDENT_AMBULATORY_CARE_PROVIDER_SITE_OTHER): Payer: HMO

## 2022-03-14 DIAGNOSIS — I428 Other cardiomyopathies: Secondary | ICD-10-CM

## 2022-03-15 LAB — CUP PACEART REMOTE DEVICE CHECK
Battery Remaining Longevity: 2 mo
Battery Voltage: 2.76 V
Brady Statistic AP VP Percent: 0 %
Brady Statistic AP VS Percent: 0 %
Brady Statistic AS VP Percent: 0 %
Brady Statistic AS VS Percent: 0 %
Brady Statistic RA Percent Paced: 0 %
Brady Statistic RV Percent Paced: 99.59 %
Date Time Interrogation Session: 20231020114840
HighPow Impedance: 65 Ohm
Implantable Lead Implant Date: 20190301
Implantable Lead Implant Date: 20190301
Implantable Lead Location: 753858
Implantable Lead Location: 753860
Implantable Pulse Generator Implant Date: 20190301
Lead Channel Impedance Value: 1064 Ohm
Lead Channel Impedance Value: 1083 Ohm
Lead Channel Impedance Value: 1083 Ohm
Lead Channel Impedance Value: 1121 Ohm
Lead Channel Impedance Value: 1121 Ohm
Lead Channel Impedance Value: 1121 Ohm
Lead Channel Impedance Value: 289.049
Lead Channel Impedance Value: 289.049
Lead Channel Impedance Value: 304 Ohm
Lead Channel Impedance Value: 308.894
Lead Channel Impedance Value: 326.029
Lead Channel Impedance Value: 399 Ohm
Lead Channel Impedance Value: 4047 Ohm
Lead Channel Impedance Value: 456 Ohm
Lead Channel Impedance Value: 551 Ohm
Lead Channel Impedance Value: 608 Ohm
Lead Channel Impedance Value: 608 Ohm
Lead Channel Impedance Value: 703 Ohm
Lead Channel Pacing Threshold Amplitude: 0.5 V
Lead Channel Pacing Threshold Amplitude: 2.75 V
Lead Channel Pacing Threshold Pulse Width: 0.4 ms
Lead Channel Pacing Threshold Pulse Width: 1.5 ms
Lead Channel Sensing Intrinsic Amplitude: 4.875 mV
Lead Channel Sensing Intrinsic Amplitude: 4.875 mV
Lead Channel Setting Pacing Amplitude: 2.5 V
Lead Channel Setting Pacing Amplitude: 3.75 V
Lead Channel Setting Pacing Pulse Width: 0.4 ms
Lead Channel Setting Pacing Pulse Width: 1.5 ms
Lead Channel Setting Sensing Sensitivity: 0.3 mV

## 2022-03-19 ENCOUNTER — Encounter: Payer: Self-pay | Admitting: Gastroenterology

## 2022-03-20 ENCOUNTER — Ambulatory Visit: Payer: HMO | Admitting: Podiatry

## 2022-03-22 NOTE — Addendum Note (Signed)
Addended by: Cheri Kearns A on: 03/22/2022 11:24 AM   Modules accepted: Level of Service

## 2022-03-22 NOTE — Progress Notes (Signed)
Remote ICD transmission.   

## 2022-03-26 ENCOUNTER — Encounter: Payer: Self-pay | Admitting: Internal Medicine

## 2022-03-26 ENCOUNTER — Ambulatory Visit: Payer: HMO | Attending: Internal Medicine | Admitting: Internal Medicine

## 2022-03-26 VITALS — BP 124/76 | HR 82 | Ht 72.0 in | Wt 209.8 lb

## 2022-03-26 DIAGNOSIS — I428 Other cardiomyopathies: Secondary | ICD-10-CM

## 2022-03-26 DIAGNOSIS — I4821 Permanent atrial fibrillation: Secondary | ICD-10-CM | POA: Diagnosis not present

## 2022-03-26 DIAGNOSIS — I5022 Chronic systolic (congestive) heart failure: Secondary | ICD-10-CM

## 2022-03-26 LAB — CUP PACEART INCLINIC DEVICE CHECK
Battery Remaining Longevity: 2 mo
Battery Voltage: 2.75 V
Brady Statistic AP VP Percent: 0 %
Brady Statistic AP VS Percent: 0 %
Brady Statistic AS VP Percent: 0 %
Brady Statistic AS VS Percent: 0 %
Brady Statistic RA Percent Paced: 0 %
Brady Statistic RV Percent Paced: 98.92 %
Date Time Interrogation Session: 20231031152737
HighPow Impedance: 68 Ohm
Implantable Lead Connection Status: 753985
Implantable Lead Connection Status: 753985
Implantable Lead Implant Date: 20190301
Implantable Lead Implant Date: 20190301
Implantable Lead Location: 753858
Implantable Lead Location: 753860
Implantable Pulse Generator Implant Date: 20190301
Lead Channel Impedance Value: 1121 Ohm
Lead Channel Impedance Value: 1121 Ohm
Lead Channel Impedance Value: 1140 Ohm
Lead Channel Impedance Value: 1140 Ohm
Lead Channel Impedance Value: 1178 Ohm
Lead Channel Impedance Value: 1178 Ohm
Lead Channel Impedance Value: 304 Ohm
Lead Channel Impedance Value: 317.612
Lead Channel Impedance Value: 317.612
Lead Channel Impedance Value: 326.029
Lead Channel Impedance Value: 326.029
Lead Channel Impedance Value: 4047 Ohm
Lead Channel Impedance Value: 418 Ohm
Lead Channel Impedance Value: 513 Ohm
Lead Channel Impedance Value: 608 Ohm
Lead Channel Impedance Value: 608 Ohm
Lead Channel Impedance Value: 665 Ohm
Lead Channel Impedance Value: 703 Ohm
Lead Channel Pacing Threshold Amplitude: 0.5 V
Lead Channel Pacing Threshold Amplitude: 2.75 V
Lead Channel Pacing Threshold Amplitude: 3 V
Lead Channel Pacing Threshold Pulse Width: 0.4 ms
Lead Channel Pacing Threshold Pulse Width: 1.5 ms
Lead Channel Pacing Threshold Pulse Width: 1.5 ms
Lead Channel Sensing Intrinsic Amplitude: 4.875 mV
Lead Channel Sensing Intrinsic Amplitude: 4.875 mV
Lead Channel Setting Pacing Amplitude: 2.5 V
Lead Channel Setting Pacing Amplitude: 3.75 V
Lead Channel Setting Pacing Pulse Width: 0.4 ms
Lead Channel Setting Pacing Pulse Width: 1.5 ms
Lead Channel Setting Sensing Sensitivity: 0.3 mV

## 2022-03-26 NOTE — Progress Notes (Signed)
\     Patient Care Team: Zenia Resides, MD as PCP - General (Family Medicine) Deboraha Sprang, MD as PCP - Electrophysiology (Cardiology) Freada Bergeron, MD as PCP - Cardiology (Cardiology) Marlaine Hind, MD as Consulting Physician (Physical Medicine and Rehabilitation) Jamal Maes, MD as Consulting Physician (Nephrology)   HPI  Anthony [name] is a 71 y.o. male Seen for atrial fibrillation with a poorly controlled ventricular response now s/p AV ablation.3/19 --- s/p CRTD 3/23 complicated by high LV thresholds and early battery depletion.  Approaching ERI. Interval recovery of LV function  History of ischemic heart disease with stenting 2003.  He was noted to have low voltage and left ventricular hypertrophy and the issue was raised as to whether he might have transthyretin amyloid technetium; pyrophosphate imaging was notable for only grade 1 findings.  He has not undergone electrophoresis testing.  The patient denies chest pain, shortness of breath, nocturnal dyspnea, orthopnea or peripheral edema.  There have been no palpitations, lightheadedness or syncope.     DATE TEST EF     7/15  Myoview    No ischemia  9/15  Echo    55-60 %    6/18 Echo  25-30% Severe LAE   8/19 Echo  55-65   --/19 PYP  negative  10/21 Echo  60-65%   10/21 Myoview  45-55%   9/22 Echo 60 - 65 % Mild aortic calcification       Date Cr K Mg Hgb  10/17 1.55 3.4   13.7  7/18 1.82   1.5  13.8  9/18 2.2 4.5  13.5  3/19 2.09 3.3    4/19 2.5 5.3    7.20 2.94 4.9  13.1  4/21 2.87 4.4    10/22 2.89 4.2  13.2 (9/22)  8/23 2.7 4.7  12.6      Past Medical History:  Diagnosis Date   Arthritis    Benign neoplasm of descending colon    Benign neoplasm of sigmoid colon    Benign neoplasm of transverse colon    CAD in native artery    a. reported MI 2000, 2001, s/p stenting of the circumflex lesion extending into the second obtuse marginal branch with a drug-eluting stent in 2003. b.  low risk nuc 2015.   Chronic atrial fibrillation (HCC)    Chronic combined systolic and diastolic CHF (congestive heart failure) ([place])    a. Previously diastolic, then EF 55-73% in 10/2016.   CKD (chronic kidney disease), stage III (HCC)    Depression    Diabetes mellitus    Edema of extremities    Erectile dysfunction    GERD (gastroesophageal reflux disease)    Hyperlipidemia    Hypertension    Myocardial infarction ([place]) 2000&2001   Obesity    Onychomycosis    OSA (obstructive sleep apnea)    S/P ICD (internal cardiac defibrillator) procedure and BiV device,  07/25/17 Medtronic  07/26/2017   Seizures ([place])    Sleep apnea    Stroke ([place])    Tubular adenoma of colon 10/2002    Past Surgical History:  Procedure Laterality Date   ANGIOPLASTY  2001   stent x 1   AV NODE ABLATION N/A 08/20/2017   Procedure: AV NODE ABLATION;  Surgeon: Deboraha Sprang, MD;  Location: [facility];  Service: Cardiovascular;  Laterality: N/A;   BIV ICD INSERTION CRT-D N/A 07/25/2017   Procedure: BIV ICD INSERTION CRT-D;  Surgeon: Deboraha Sprang, MD;  Location: [facility];  Service: Cardiovascular;  Laterality: N/A;   COLONOSCOPY  2000?   negative   COLONOSCOPY WITH PROPOFOL N/A 02/08/2019   Procedure: COLONOSCOPY WITH PROPOFOL;  Surgeon: Ladene Artist, MD;  [facility];  Service: Endoscopy;  Laterality: N/A;   FOOT ARTHROTOMY Right    POLYPECTOMY  02/08/2019   Procedure: POLYPECTOMY;  Surgeon: Ladene Artist, MD;  Location: [facility];  Service: Endoscopy;;   TOE AMPUTATION Left 2012    Current Outpatient Medications  Medication Sig Dispense Refill   acetaminophen (TYLENOL) 650 MG CR tablet Take 1,300 mg by mouth 2 (two) times daily as needed for pain.     apixaban (ELIQUIS) 5 MG TABS tablet Take 1 tablet (5 mg total) by mouth 2 (two) times daily. 180 tablet 3   carvedilol (COREG) 25 MG tablet TAKE 1 TABLET 2 TIMES DAILY. 180 tablet 3   Continuous Blood Gluc Receiver  (FREESTYLE LIBRE 14 DAY READER) DEVI Apply topically as directed.     Continuous Blood Gluc Sensor (FREESTYLE LIBRE 14 DAY SENSOR) MISC 1 Device by Does not apply route every 14 (fourteen) days. 2 each 11   diclofenac Sodium (VOLTAREN) 1 % GEL Apply 4 g topically 4 (four) times daily. 350 g 0   Dulaglutide (TRULICITY) 4.5 XF/8.1WE SOPN Inject 4.5 mg as directed once a week. 2 mL 11   furosemide (LASIX) 40 MG tablet Take 1 tablet (40 mg total) by mouth daily. 90 tablet 3   gabapentin (NEURONTIN) 300 MG capsule Take 1 capsule (300 mg total) by mouth at bedtime. 90 capsule 3   hydrALAZINE (APRESOLINE) 50 MG tablet Take 1 tablet (50 mg total) by mouth 3 (three) times daily. 270 tablet 3   insulin aspart (NOVOLOG FLEXPEN) 100 UNIT/ML FlexPen Inject 7 units before lunch and 10 units before dinner. 15 mL 11   insulin degludec (TRESIBA FLEXTOUCH) 200 UNIT/ML FlexTouch Pen Inject 20 Units into the skin daily. 15 mL 3   Insulin Pen Needle (PEN NEEDLES) 32G X 4 MM MISC 1 Device by Does not apply route as directed. 200 each 11   Lancets (ONETOUCH ULTRASOFT) lancets Use as instructed 100 each 12   loratadine (CLARITIN) 10 MG tablet Take 10 mg by mouth daily as needed for allergies.     losartan (COZAAR) 25 MG tablet Take 1 tablet (25 mg total) by mouth daily. 90 tablet 3   Multiple Vitamin (MULTIVITAMIN) tablet Take 1 tablet by mouth daily.       nitroGLYCERIN (NITROSTAT) 0.4 MG SL tablet Place 1 tablet (0.4 mg total) under the tongue every 5 (five) minutes as needed for chest pain. If no relief by 3rd tab, call 911 25 tablet 3   omeprazole (PRILOSEC) 40 MG capsule TAKE ONE CAPSULE BY MOUTH DAILY BEFORE SUPPER 90 capsule 3   oxymorphone (OPANA) 5 MG tablet Take 10-15 mg by mouth in the morning and at bedtime. Per Dr Brien Few     PARoxetine (PAXIL) 30 MG tablet Take 1 tablet (30 mg total) by mouth daily. 90 tablet 3   potassium chloride SA (KLOR-CON M20) 20 MEQ tablet Take 1 tablet (20 mEq total) by mouth daily.  90 tablet 3   rosuvastatin (CRESTOR) 20 MG tablet Take 1 tablet (20 mg total) by mouth daily. 90 tablet 3   tamsulosin (FLOMAX) 0.4 MG CAPS capsule TAKE 1 CAPSULE BY MOUTH EVERYDAY AT BEDTIME 90 capsule 3   loratadine (CLARITIN) 10 MG tablet Take 10 mg by mouth  daily as needed for allergies.     No current facility-administered medications for this visit.    Allergies  Allergen Reactions   Enalapril Maleate Cough      Review of Systems negative except from HPI and PMH  Physical Exam BP 124/76   Pulse 82   Ht 6' (1.829 m)   Wt 209 lb 12.8 oz (95.2 kg)   SpO2 97%   BMI 28.45 kg/m  Well developed and well nourished in no acute distress HENT normal Neck supple with JVP-flat Clear Device pocket well healed; without hematoma or erythema.  There is no tethering  Regular rate and rhythm, no murmur Abd-soft with active BS No Clubbing cyanosis  edema Skin-warm and dry A & Oriented  Grossly normal sensory and motor function  ECG atrial fibrillation with ventricular pacing to 82 Negative QRS lead I and RS lead V1  Device function is normal. Programming changes none  See Paceart for details    Assessment and  Plan  Atrial fibrillation-permanent rapid ventricular response  AV Node ablation   LV threshold elevated   Low-voltage limb leads and severe concentric LVH concerning for amyloid.  Negative PYP scan  Congestive heart failure-chronic-diastolic class IIa    Hypertension  Renal insufficiency grade 4  Presyncope  HFrecEF  Coronary artery disease with circumflex stenting remote  Implantable defibrillator-CRT Medtronic   Patients device is approaching ERI.  Demonstrated tones.  Reviewed the procedure.  Blood pressure well controlled, continue his Cozaar 25 and carvedilol 25 as well as hydralazine.  Discussed with his PCP the possibility of using amlodipine to get away from 3 times daily hydralazine.  Recovered heart muscle function  Continue carvedilol and  losartan.  Atrial fibrillation-permanent.  No bleeding.  Continue Eliquis.

## 2022-03-26 NOTE — Patient Instructions (Signed)
Medication Instructions:  Your physician recommends that you continue on your current medications as directed. Please refer to the Current Medication list given to you today.  *If you need a refill on your cardiac medications before your next appointment, please call your pharmacy*   Lab Work: None ordered.  If you have labs (blood work) drawn today and your tests are completely normal, you will receive your results only by: MyChart Message (if you have MyChart) OR A paper copy in the mail If you have any lab test that is abnormal or we need to change your treatment, we will call you to review the results.   Testing/Procedures: None ordered.    Follow-Up: At [facility], you and your health needs are our priority.  As part of our continuing mission to provide you with exceptional heart care, we have created designated Provider Care Teams.  These Care Teams include your primary Cardiologist (physician) and Advanced Practice Providers (APPs -  Physician Assistants and Nurse Practitioners) who all work together to provide you with the care you need, when you need it.  We recommend signing up for the patient portal called "MyChart".  Sign up information is provided on this After Visit Summary.  MyChart is used to connect with patients for Virtual Visits (Telemedicine).  Patients are able to view lab/test results, encounter notes, upcoming appointments, etc.  Non-urgent messages can be sent to your provider as well.   To learn more about what you can do with MyChart, go to [email or web address].    Your next appointment:   6 months with Dr Klein  Important Information About Sugar       

## 2022-03-28 ENCOUNTER — Ambulatory Visit (INDEPENDENT_AMBULATORY_CARE_PROVIDER_SITE_OTHER): Payer: BC Managed Care – PPO | Admitting: Family Medicine

## 2022-03-28 ENCOUNTER — Encounter: Payer: Self-pay | Admitting: Family Medicine

## 2022-03-28 VITALS — BP 112/62 | HR 68 | Ht 72.0 in | Wt 209.4 lb

## 2022-03-28 DIAGNOSIS — E119 Type 2 diabetes mellitus without complications: Secondary | ICD-10-CM

## 2022-03-28 DIAGNOSIS — I2510 Atherosclerotic heart disease of native coronary artery without angina pectoris: Secondary | ICD-10-CM

## 2022-03-28 DIAGNOSIS — I5032 Chronic diastolic (congestive) heart failure: Secondary | ICD-10-CM

## 2022-03-28 DIAGNOSIS — Z23 Encounter for immunization: Secondary | ICD-10-CM

## 2022-03-28 DIAGNOSIS — E7800 Pure hypercholesterolemia, unspecified: Secondary | ICD-10-CM

## 2022-03-28 DIAGNOSIS — Z794 Long term (current) use of insulin: Secondary | ICD-10-CM

## 2022-03-28 DIAGNOSIS — N184 Chronic kidney disease, stage 4 (severe): Secondary | ICD-10-CM | POA: Diagnosis not present

## 2022-03-28 DIAGNOSIS — I4821 Permanent atrial fibrillation: Secondary | ICD-10-CM

## 2022-03-28 DIAGNOSIS — Z95810 Presence of automatic (implantable) cardiac defibrillator: Secondary | ICD-10-CM

## 2022-03-28 DIAGNOSIS — I5022 Chronic systolic (congestive) heart failure: Secondary | ICD-10-CM

## 2022-03-28 DIAGNOSIS — I10 Essential (primary) hypertension: Secondary | ICD-10-CM

## 2022-03-28 DIAGNOSIS — I428 Other cardiomyopathies: Secondary | ICD-10-CM

## 2022-03-28 LAB — POCT GLYCOSYLATED HEMOGLOBIN (HGB A1C): HbA1c, POC (controlled diabetic range): 10.7 % — AB (ref 0.0–7.0)

## 2022-03-28 MED ORDER — NOVOLOG FLEXPEN 100 UNIT/ML ~~LOC~~ SOPN: PEN_INJECTOR | SUBCUTANEOUS | 11 refills | Status: DC

## 2022-03-28 MED ORDER — FUROSEMIDE 40 MG PO TABS: 40.0000 mg | ORAL_TABLET | Freq: Every day | ORAL | 3 refills | Status: DC

## 2022-03-28 MED ORDER — TRESIBA FLEXTOUCH 200 UNIT/ML ~~LOC~~ SOPN: 25.0000 [IU] | PEN_INJECTOR | Freq: Every day | SUBCUTANEOUS | 3 refills | Status: DC

## 2022-03-28 NOTE — Patient Instructions (Signed)
Things generally look good except the diabetes. I sent in a refill for your furosemide I will call with the blood test results. For the diabetes Increase the tresiba to 26 units every morning Start taking novalog 7 units before breakfast.  Keep taking 7 units before lunch.  Increase to 12 units before dinner.  See me again in six weeks.

## 2022-03-29 ENCOUNTER — Encounter: Payer: Self-pay | Admitting: Family Medicine

## 2022-03-29 LAB — RENAL FUNCTION PANEL
Albumin: 3.7 g/dL — ABNORMAL LOW (ref 3.9–4.9)
BUN/Creatinine Ratio: 10 (ref 10–24)
BUN: 21 mg/dL (ref 8–27)
CO2: 25 mmol/L (ref 20–29)
Calcium: 9.1 mg/dL (ref 8.6–10.2)
Chloride: 100 mmol/L (ref 96–106)
Creatinine, Ser: 2.12 mg/dL — ABNORMAL HIGH (ref 0.76–1.27)
Glucose: 299 mg/dL — ABNORMAL HIGH (ref 70–99)
Phosphorus: 3.5 mg/dL (ref 2.8–4.1)
Potassium: 3.9 mmol/L (ref 3.5–5.2)
Sodium: 141 mmol/L (ref 134–144)
eGFR: 33 mL/min/{1.73_m2} — ABNORMAL LOW (ref >59)

## 2022-03-29 NOTE — Assessment & Plan Note (Signed)
Stable on current meds 

## 2022-03-29 NOTE — Assessment & Plan Note (Signed)
I am pleased with the result of his renal panel.  Stable to improved renal function.

## 2022-03-29 NOTE — Assessment & Plan Note (Signed)
Poor control.  See AVS and med list for my changes to both long and short acting insulin.

## 2022-03-29 NOTE — Progress Notes (Signed)
    SUBJECTIVE:   CHIEF COMPLAINT / HPI:   FU DM and CKD. DM control now poor with A1C=10.7.  Current insulin regimen is tresiba 20 units daily.  Novulin 7 units with lunch, 10 units with dinner.  Dinner is his big meal.  No short acting insulin currently with breakfast. CKD due for recheck.  Asked about calcitriol which he is no longer taking.  Last GFR was 25 with normal phos and calcium.   Permanent a fib on eliquis and carvedilol.  No palpitations. CAD on rosuvastatin and eliquis.  No Chest pain or DOE. HFrEF.  On appropriate meds.  No DOE or leg swelling.   OBJECTIVE:   BP 112/62   Pulse 68   Ht 6' (1.829 m)   Wt 209 lb 6.4 oz (95 kg)   SpO2 98%   BMI 28.40 kg/m   Lungs clear Cardiac RRR without m or g No peripheral edema.  ASSESSMENT/PLAN:   Congestive heart failure, NYHA class 3, chronic, systolic (HCC) Stable on current meds  Chronic kidney disease (CKD), stage IV (severe) ([place]) I am pleased with the result of his renal panel.  Stable to improved renal function.  Insulin dependent type 2 diabetes mellitus (HCC) Poor control.  See AVS and med list for my changes to both long and short acting insulin.  Atrial fibrillation, permanent ([place]) Stable on current meds  Coronary atherosclerosis Stable on current meds     Zenia Resides, MD [place]

## 2022-04-15 ENCOUNTER — Ambulatory Visit (INDEPENDENT_AMBULATORY_CARE_PROVIDER_SITE_OTHER): Payer: HMO

## 2022-04-15 DIAGNOSIS — I4821 Permanent atrial fibrillation: Secondary | ICD-10-CM

## 2022-04-21 LAB — CUP PACEART REMOTE DEVICE CHECK
Battery Remaining Longevity: 1 mo
Battery Voltage: 2.72 V
Brady Statistic AP VP Percent: 0 %
Brady Statistic AP VS Percent: 0 %
Brady Statistic AS VP Percent: 0 %
Brady Statistic AS VS Percent: 0 %
Brady Statistic RA Percent Paced: 0 %
Brady Statistic RV Percent Paced: 98.78 %
Date Time Interrogation Session: 20231126021510
HighPow Impedance: 66 Ohm
Implantable Lead Connection Status: 753985
Implantable Lead Connection Status: 753985
Implantable Lead Implant Date: 20190301
Implantable Lead Implant Date: 20190301
Implantable Lead Location: 753858
Implantable Lead Location: 753860
Implantable Pulse Generator Implant Date: 20190301
Lead Channel Impedance Value: 1178 Ohm
Lead Channel Impedance Value: 1178 Ohm
Lead Channel Impedance Value: 1197 Ohm
Lead Channel Impedance Value: 1197 Ohm
Lead Channel Impedance Value: 1197 Ohm
Lead Channel Impedance Value: 1197 Ohm
Lead Channel Impedance Value: 313.212
Lead Channel Impedance Value: 326.029
Lead Channel Impedance Value: 326.029
Lead Channel Impedance Value: 336.648
Lead Channel Impedance Value: 336.648
Lead Channel Impedance Value: 399 Ohm
Lead Channel Impedance Value: 4047 Ohm
Lead Channel Impedance Value: 475 Ohm
Lead Channel Impedance Value: 608 Ohm
Lead Channel Impedance Value: 646 Ohm
Lead Channel Impedance Value: 703 Ohm
Lead Channel Impedance Value: 703 Ohm
Lead Channel Pacing Threshold Amplitude: 0.5 V
Lead Channel Pacing Threshold Amplitude: 2.75 V
Lead Channel Pacing Threshold Pulse Width: 0.4 ms
Lead Channel Pacing Threshold Pulse Width: 1.5 ms
Lead Channel Sensing Intrinsic Amplitude: 7.25 mV
Lead Channel Sensing Intrinsic Amplitude: 7.25 mV
Lead Channel Setting Pacing Amplitude: 2.5 V
Lead Channel Setting Pacing Amplitude: 3.75 V
Lead Channel Setting Pacing Pulse Width: 0.4 ms
Lead Channel Setting Pacing Pulse Width: 1.5 ms
Lead Channel Setting Sensing Sensitivity: 0.3 mV

## 2022-04-22 ENCOUNTER — Telehealth: Payer: Self-pay

## 2022-04-22 DIAGNOSIS — Z01812 Encounter for preprocedural laboratory examination: Secondary | ICD-10-CM

## 2022-04-22 DIAGNOSIS — I5022 Chronic systolic (congestive) heart failure: Secondary | ICD-10-CM

## 2022-04-22 DIAGNOSIS — I428 Other cardiomyopathies: Secondary | ICD-10-CM

## 2022-04-22 DIAGNOSIS — I4821 Permanent atrial fibrillation: Secondary | ICD-10-CM

## 2022-04-22 NOTE — Telephone Encounter (Signed)
Pt advised his device has reached RRT.  Advised he would receive a call from Dr. Olin Pia nurse to schedule gen change.  Pt aware.

## 2022-04-22 NOTE — Telephone Encounter (Signed)
ICD reached RRT 04/21/22. Called patient to advise. No answer, LMTCB.   Patient had apt with Dr. Caryl Comes on 03/26/22 and discussed procedure. Will not need another apt prior to scheduling.

## 2022-04-30 NOTE — Telephone Encounter (Signed)
Spoke with pt re: generator change.  Pt would like to schedule for 05/17/2022.  Pt advised once procedure is scheduled will contact with further instructions.  Pt verbalizes understanding and agrees with current plan.

## 2022-05-02 ENCOUNTER — Ambulatory Visit (INDEPENDENT_AMBULATORY_CARE_PROVIDER_SITE_OTHER): Payer: BC Managed Care – PPO | Admitting: Family Medicine

## 2022-05-02 ENCOUNTER — Encounter: Payer: Self-pay | Admitting: Family Medicine

## 2022-05-02 VITALS — BP 124/82 | HR 82 | Ht 72.0 in | Wt 211.2 lb

## 2022-05-02 DIAGNOSIS — I10 Essential (primary) hypertension: Secondary | ICD-10-CM

## 2022-05-02 DIAGNOSIS — I5022 Chronic systolic (congestive) heart failure: Secondary | ICD-10-CM

## 2022-05-02 DIAGNOSIS — I4821 Permanent atrial fibrillation: Secondary | ICD-10-CM

## 2022-05-02 DIAGNOSIS — Z794 Long term (current) use of insulin: Secondary | ICD-10-CM

## 2022-05-02 DIAGNOSIS — E119 Type 2 diabetes mellitus without complications: Secondary | ICD-10-CM

## 2022-05-02 NOTE — Patient Instructions (Signed)
I think things are look good. You blood pressure is great. Your heart failure is under control. I am pleased with no swelling.   For the diabetes, make sure you take the small dose of novulog (7 units before your brunch and with any snack.)  Take the big dose of novulog (12 units as you are sitting down for your big dinner. Plan to see me in late January.  I will do blood work at that visit. Merry Christmas and Happy New Year.

## 2022-05-02 NOTE — Telephone Encounter (Signed)
Attempted phone call to pt to review ICD generator change instructions.  Left voicemail to contact RN at [phone].

## 2022-05-03 ENCOUNTER — Encounter: Payer: Self-pay | Admitting: Family Medicine

## 2022-05-03 NOTE — Assessment & Plan Note (Addendum)
Nice rate control.  Continue on eliquis.

## 2022-05-03 NOTE — Progress Notes (Signed)
    SUBJECTIVE:   CHIEF COMPLAINT / HPI:   FU diabetes and more.   DM.  Poor control last A1C.  Back on CBG monitoring.  Sugars are improved.  Mostly afternoon highs.  States he takes his short acting insulin regularly, but he did not know the doses when asked. HBP stable today CKD 4.  Last creat 1 month ago, good for him CHF.  Wt stable.  Denies orthopnea or pedal edema.      OBJECTIVE:   BP 124/82   Pulse 82   Ht 6' (1.829 m)   Wt 211 lb 3.2 oz (95.8 kg)   SpO2 98%   BMI 28.64 kg/m   Lungs clear Cardiac RRR without m or g Ext no edema  ASSESSMENT/PLAN:   Insulin dependent type 2 diabetes mellitus ([place]) Control recently improved. With afternoon sugars being high, I emphasized short acting insulin with meals.  Congestive heart failure, NYHA class 3, chronic, systolic (HCC) Good control on current meds  Atrial fibrillation, permanent (HCC) Nice rate control.  Continue on eliquis.  HYPERTENSION, BENIGN SYSTEMIC Good control on current meds.     Zenia Resides, MD [place]

## 2022-05-03 NOTE — Assessment & Plan Note (Signed)
Good control on current meds. 

## 2022-05-03 NOTE — Telephone Encounter (Signed)
Patient returned RN's call. 

## 2022-05-03 NOTE — Assessment & Plan Note (Signed)
Control recently improved. With afternoon sugars being high, I emphasized short acting insulin with meals.

## 2022-05-06 NOTE — Telephone Encounter (Signed)
Spoke with pt and reviewed device instruction letter.  Pt advised to pick up a hard copy of the letter and surgical scrub at the front desk when he comes for labs on 05/10/2022.  Pt verbalizes understanding and agrees with current plan.

## 2022-05-10 ENCOUNTER — Ambulatory Visit: Payer: BC Managed Care – PPO | Attending: Internal Medicine

## 2022-05-10 DIAGNOSIS — Z01812 Encounter for preprocedural laboratory examination: Secondary | ICD-10-CM

## 2022-05-10 DIAGNOSIS — I4821 Permanent atrial fibrillation: Secondary | ICD-10-CM

## 2022-05-10 DIAGNOSIS — I5022 Chronic systolic (congestive) heart failure: Secondary | ICD-10-CM

## 2022-05-10 DIAGNOSIS — I428 Other cardiomyopathies: Secondary | ICD-10-CM

## 2022-05-11 LAB — BASIC METABOLIC PANEL
BUN/Creatinine Ratio: 13 (ref 10–24)
BUN: 28 mg/dL — ABNORMAL HIGH (ref 8–27)
CO2: 21 mmol/L (ref 20–29)
Calcium: 9.2 mg/dL (ref 8.6–10.2)
Chloride: 98 mmol/L (ref 96–106)
Creatinine, Ser: 2.08 mg/dL — ABNORMAL HIGH (ref 0.76–1.27)
Glucose: 288 mg/dL — ABNORMAL HIGH (ref 70–99)
Potassium: 3.8 mmol/L (ref 3.5–5.2)
Sodium: 137 mmol/L (ref 134–144)
eGFR: 34 mL/min/{1.73_m2} — ABNORMAL LOW (ref >59)

## 2022-05-11 LAB — CBC
Hematocrit: 44 % (ref 37.5–51.0)
Hemoglobin: 14.8 g/dL (ref 13.0–17.7)
MCH: 29.7 pg (ref 26.6–33.0)
MCHC: 33.6 g/dL (ref 31.5–35.7)
MCV: 88 fL (ref 79–97)
Platelets: 136 10*3/uL — ABNORMAL LOW (ref 150–450)
RBC: 4.98 x10E6/uL (ref 4.14–5.80)
RDW: 13.8 % (ref 11.6–15.4)
WBC: 7.3 10*3/uL (ref 3.4–10.8)

## 2022-05-16 ENCOUNTER — Ambulatory Visit (INDEPENDENT_AMBULATORY_CARE_PROVIDER_SITE_OTHER): Payer: PPO

## 2022-05-16 DIAGNOSIS — I428 Other cardiomyopathies: Secondary | ICD-10-CM | POA: Diagnosis not present

## 2022-05-16 LAB — CUP PACEART REMOTE DEVICE CHECK
Battery Remaining Longevity: 1 mo — CL
Battery Voltage: 2.68 V
Brady Statistic AP VP Percent: 0 %
Brady Statistic AP VS Percent: 0 %
Brady Statistic AS VP Percent: 0 %
Brady Statistic AS VS Percent: 0 %
Brady Statistic RA Percent Paced: 0 %
Brady Statistic RV Percent Paced: 99.47 %
Date Time Interrogation Session: 20231221033526
HighPow Impedance: 67 Ohm
Implantable Lead Connection Status: 753985
Implantable Lead Connection Status: 753985
Implantable Lead Implant Date: 20190301
Implantable Lead Implant Date: 20190301
Implantable Lead Location: 753858
Implantable Lead Location: 753860
Implantable Pulse Generator Implant Date: 20190301
Lead Channel Impedance Value: 1121 Ohm
Lead Channel Impedance Value: 1140 Ohm
Lead Channel Impedance Value: 1178 Ohm
Lead Channel Impedance Value: 1197 Ohm
Lead Channel Impedance Value: 1197 Ohm
Lead Channel Impedance Value: 1235 Ohm
Lead Channel Impedance Value: 289.049
Lead Channel Impedance Value: 301.328
Lead Channel Impedance Value: 312.507
Lead Channel Impedance Value: 317.612
Lead Channel Impedance Value: 330.057
Lead Channel Impedance Value: 4047 Ohm
Lead Channel Impedance Value: 418 Ohm
Lead Channel Impedance Value: 513 Ohm
Lead Channel Impedance Value: 551 Ohm
Lead Channel Impedance Value: 608 Ohm
Lead Channel Impedance Value: 665 Ohm
Lead Channel Impedance Value: 722 Ohm
Lead Channel Pacing Threshold Amplitude: 0.5 V
Lead Channel Pacing Threshold Amplitude: 3 V
Lead Channel Pacing Threshold Pulse Width: 0.4 ms
Lead Channel Pacing Threshold Pulse Width: 1.5 ms
Lead Channel Sensing Intrinsic Amplitude: 7.25 mV
Lead Channel Sensing Intrinsic Amplitude: 7.25 mV
Lead Channel Setting Pacing Amplitude: 2.5 V
Lead Channel Setting Pacing Amplitude: 3.75 V
Lead Channel Setting Pacing Pulse Width: 0.4 ms
Lead Channel Setting Pacing Pulse Width: 1.5 ms
Lead Channel Setting Sensing Sensitivity: 0.3 mV

## 2022-05-17 ENCOUNTER — Other Ambulatory Visit: Payer: Self-pay

## 2022-05-17 ENCOUNTER — Ambulatory Visit (HOSPITAL_COMMUNITY)
Admission: RE | Admit: 2022-05-17 | Discharge: 2022-05-17 | Disposition: A | Discharge status: Home / Self Care | Payer: HMO | Attending: Internal Medicine | Admitting: Internal Medicine

## 2022-05-17 ENCOUNTER — Ambulatory Visit (HOSPITAL_COMMUNITY)
Admission: RE | Disposition: A | Discharge status: Home / Self Care | Payer: Self-pay | Source: Home / Self Care | Attending: Internal Medicine

## 2022-05-17 DIAGNOSIS — Z4502 Encounter for adjustment and management of automatic implantable cardiac defibrillator: Secondary | ICD-10-CM

## 2022-05-17 DIAGNOSIS — Z87891 Personal history of nicotine dependence: Secondary | ICD-10-CM | POA: Insufficient documentation

## 2022-05-17 DIAGNOSIS — R55 Syncope and collapse: Secondary | ICD-10-CM | POA: Diagnosis not present

## 2022-05-17 DIAGNOSIS — I5042 Chronic combined systolic (congestive) and diastolic (congestive) heart failure: Secondary | ICD-10-CM | POA: Insufficient documentation

## 2022-05-17 DIAGNOSIS — I4821 Permanent atrial fibrillation: Secondary | ICD-10-CM | POA: Insufficient documentation

## 2022-05-17 DIAGNOSIS — N184 Chronic kidney disease, stage 4 (severe): Secondary | ICD-10-CM | POA: Diagnosis not present

## 2022-05-17 DIAGNOSIS — Z955 Presence of coronary angioplasty implant and graft: Secondary | ICD-10-CM | POA: Insufficient documentation

## 2022-05-17 DIAGNOSIS — I2510 Atherosclerotic heart disease of native coronary artery without angina pectoris: Secondary | ICD-10-CM | POA: Diagnosis not present

## 2022-05-17 DIAGNOSIS — I130 Hypertensive heart and chronic kidney disease with heart failure and stage 1 through stage 4 chronic kidney disease, or unspecified chronic kidney disease: Secondary | ICD-10-CM | POA: Insufficient documentation

## 2022-05-17 HISTORY — PX: BIV ICD GENERATOR CHANGEOUT: EP1194

## 2022-05-17 LAB — GLUCOSE, CAPILLARY
Glucose-Capillary: 244 mg/dL — ABNORMAL HIGH (ref 70–99)
Glucose-Capillary: 138 mg/dL — ABNORMAL HIGH (ref 70–99)

## 2022-05-17 SURGERY — BIV ICD GENERATOR CHANGEOUT

## 2022-05-17 MED ORDER — LIDOCAINE HCL 1 % IJ SOLN
INTRAMUSCULAR | Status: AC
  Filled 2022-05-17: qty 20

## 2022-05-17 MED ORDER — SODIUM CHLORIDE 0.9 % IV SOLN: INTRAVENOUS | Status: DC

## 2022-05-17 MED ORDER — LABETALOL HCL 5 MG/ML IV SOLN
INTRAVENOUS | Status: DC | PRN
  Administered 2022-05-17: 20 mg via INTRAVENOUS

## 2022-05-17 MED ORDER — POVIDONE-IODINE 10 % EX SWAB
2.0000 | Freq: Once | CUTANEOUS | Status: AC
  Administered 2022-05-17: 2 via TOPICAL

## 2022-05-17 MED ORDER — LIDOCAINE HCL (PF) 1 % IJ SOLN
INTRAMUSCULAR | Status: DC | PRN
  Administered 2022-05-17: 60 mL

## 2022-05-17 MED ORDER — SODIUM CHLORIDE 0.9 % IV SOLN
INTRAVENOUS | Status: AC
  Filled 2022-05-17: qty 2

## 2022-05-17 MED ORDER — CEFAZOLIN SODIUM-DEXTROSE 2-4 GM/100ML-% IV SOLN
INTRAVENOUS | Status: AC
  Filled 2022-05-17: qty 100

## 2022-05-17 MED ORDER — LABETALOL HCL 5 MG/ML IV SOLN
INTRAVENOUS | Status: AC
  Filled 2022-05-17: qty 4

## 2022-05-17 MED ORDER — FENTANYL CITRATE (PF) 100 MCG/2ML IJ SOLN
INTRAMUSCULAR | Status: DC | PRN
  Administered 2022-05-17: 50 ug via INTRAVENOUS

## 2022-05-17 MED ORDER — CHLORHEXIDINE GLUCONATE 4 % EX LIQD: 4.0000 | Freq: Once | CUTANEOUS | Status: DC

## 2022-05-17 MED ORDER — SODIUM CHLORIDE 0.9 % IV SOLN
80.0000 mg | INTRAVENOUS | Status: AC
  Administered 2022-05-17: 80 mg

## 2022-05-17 MED ORDER — MIDAZOLAM HCL 5 MG/5ML IJ SOLN
INTRAMUSCULAR | Status: DC | PRN
  Administered 2022-05-17: 2 mg via INTRAVENOUS

## 2022-05-17 MED ORDER — ACETAMINOPHEN 325 MG PO TABS: 325.0000 mg | ORAL_TABLET | ORAL | Status: DC | PRN

## 2022-05-17 MED ORDER — CEFAZOLIN SODIUM-DEXTROSE 2-4 GM/100ML-% IV SOLN
2.0000 g | INTRAVENOUS | Status: AC
  Administered 2022-05-17: 2 g via INTRAVENOUS

## 2022-05-17 MED ORDER — FENTANYL CITRATE (PF) 100 MCG/2ML IJ SOLN
INTRAMUSCULAR | Status: AC
  Filled 2022-05-17: qty 2

## 2022-05-17 MED ORDER — MIDAZOLAM HCL 5 MG/5ML IJ SOLN
INTRAMUSCULAR | Status: AC
  Filled 2022-05-17: qty 5

## 2022-05-17 SURGICAL SUPPLY — 5 items
CABLE SURGICAL S-101-97-12 (CABLE) IMPLANT
HEMOSTAT SURGICEL 2X4 FIBR (HEMOSTASIS) IMPLANT
ICD VIGILANT DF4 G247 (ICD Generator) IMPLANT
PAD DEFIB RADIO PHYSIO CONN (PAD) IMPLANT
TRAY PACEMAKER INSERTION (PACKS) IMPLANT

## 2022-05-17 NOTE — Discharge Instructions (Signed)

## 2022-05-17 NOTE — Progress Notes (Signed)
Patients B/P high during and after procedure. Patient stated that he has not taken his B/P meds since yesterday. Caryl Comes, MD aware. Instructed patient and his wife to take his B/P meds as soon as he got home. Patient and wife verbalized understanding.

## 2022-05-17 NOTE — H&P (Signed)
Patient Care Team: Zenia Resides, MD as PCP - General (Family Medicine) Deboraha Sprang, MD as PCP - Electrophysiology (Cardiology) Freada Bergeron, MD as PCP - Cardiology (Cardiology) Marlaine Hind, MD as Consulting Physician (Physical Medicine and Rehabilitation) Jamal Maes, MD as Consulting Physician (Nephrology)   HPI  Anthony [name] is a 71 y.o. male admtted for battery replacement for previously implanted CRTD Cx by high LV thresholds and early battery depletion.  Permanent AF s/p AV ablation 3/19      History of ischemic heart disease with stenting 2003.   He was noted to have low voltage and left ventricular hypertrophy and the issue was raised as to whether he might have transthyretin amyloid technetium; pyrophosphate imaging was notable for only grade 1 findings.  He has not undergone electrophoresis testing.   The patient denies chest pain nocturnal dyspnea, orthopnea or peripheral edema.  There have been no palpitations, lightheadedness or syncope.  More DOE without pain    DATE TEST EF     7/15  Myoview    No ischemia  9/15  Echo    55-60 %    6/18 Echo  25-30% Severe LAE   8/19 Echo  55-65    --/19 PYP   negative  10/21 Echo  60-65%    10/21 Myoview  45-55%    9/22 Echo 60 - 65 % Mild aortic calcification       Date Cr K Mg Hgb  10/17 1.55 3.4   13.7  7/18 1.82   1.5  13.8  9/18 2.2 4.5   13.5  3/19 2.09 3.3      4/19 2.5 5.3      7.20 2.94 4.9   13.1  4/21 2.87 4.4      10/22 2.89 4.2   13.2 (9/22)  8/23 2.7 4.7   12.6         Records and Results Reviewed   Past Medical History:  Diagnosis Date   Arthritis    Benign neoplasm of descending colon    Benign neoplasm of sigmoid colon    Benign neoplasm of transverse colon    CAD in native artery    a. reported MI 2000, 2001, s/p stenting of the circumflex lesion extending into the second obtuse marginal branch with a drug-eluting stent in 2003. b. low risk nuc 2015.   Chronic  atrial fibrillation (HCC)    Chronic combined systolic and diastolic CHF (congestive heart failure) ([place])    a. Previously diastolic, then EF 09-73% in 10/2016.   CKD (chronic kidney disease), stage III (HCC)    Depression    Diabetes mellitus    Edema of extremities    Erectile dysfunction    GERD (gastroesophageal reflux disease)    Hyperlipidemia    Hypertension    Myocardial infarction ([place]) 2000&2001   Obesity    Onychomycosis    OSA (obstructive sleep apnea)    S/P ICD (internal cardiac defibrillator) procedure and BiV device,  07/25/17 Medtronic  07/26/2017   Seizures ([place])    Sleep apnea    Stroke ([place])    Tubular adenoma of colon 10/2002    Past Surgical History:  Procedure Laterality Date   ANGIOPLASTY  2001   stent x 1   AV NODE ABLATION N/A 08/20/2017   Procedure: AV NODE ABLATION;  Surgeon: Deboraha Sprang, MD;  Location: [facility];  Service: Cardiovascular;  Laterality: N/A;   BIV ICD  INSERTION CRT-D N/A 07/25/2017   Procedure: BIV ICD INSERTION CRT-D;  Surgeon: Deboraha Sprang, MD;  Location: [facility];  Service: Cardiovascular;  Laterality: N/A;   COLONOSCOPY  2000?   negative   COLONOSCOPY WITH PROPOFOL N/A 02/08/2019   Procedure: COLONOSCOPY WITH PROPOFOL;  Surgeon: Ladene Artist, MD;  [facility];  Service: Endoscopy;  Laterality: N/A;   FOOT ARTHROTOMY Right    POLYPECTOMY  02/08/2019   Procedure: POLYPECTOMY;  Surgeon: Ladene Artist, MD;  [facility];  Service: Endoscopy;;   TOE AMPUTATION Left 2012    No current facility-administered medications for this encounter.    Allergies  Allergen Reactions   Enalapril Maleate Cough      Social History   Tobacco Use   Smoking status: Former    Packs/day: 0.25    Years: 45.00    Total pack years: 11.25    Types: Cigarettes    Start date: 05/28/1967    Quit date: 10/28/2018    Years since quitting: 3.5   Smokeless tobacco: Never  Vaping Use   Vaping Use: Never used   Substance Use Topics   Alcohol use: No    Comment: quit in 1993   Drug use: No     Family History  Problem Relation Age of Onset   Heart attack Mother    CVA Mother    Diabetes Mother    Heart attack Father    Colon cancer Neg Hx    Stomach cancer Neg Hx      No outpatient medications have been marked as taking for the 05/17/22 encounter [facility] Encounter).     Review of Systems negative except from HPI and PMH  Physical Exam There were no vitals taken for this visit. Well developed and well nourished in no acute distress HENT normal E scleral and icterus clear Neck Supple JVP flat; carotids brisk and full Clear to ausculation Regular rate and rhythm, no murmurs gallops or rub Soft with active bowel sounds No clubbing cyanosis Trace Edema Alert and oriented, grossly normal motor and sensory function Skin Warm and Dry    Assessment and  Plan  Atrial fibrillation-permanent rapid ventricular response   AV Node ablation    LV threshold elevated    Low-voltage limb leads and severe concentric LVH concerning for amyloid.  Negative PYP scan   Congestive heart failure-chronic-diastolic class IIa     Hypertension   Renal insufficiency grade 4   Presyncope   HFrecEF   Coronary artery disease with circumflex stenting remote   Implantable defibrillator-CRT Medtronic at ERI   For Generator replacement will change to BS for battery longevity Risks reviewed  Consider repeat echo and myoview

## 2022-05-21 ENCOUNTER — Encounter (HOSPITAL_COMMUNITY): Payer: Self-pay | Admitting: Internal Medicine

## 2022-05-21 ENCOUNTER — Telehealth: Payer: Self-pay

## 2022-05-21 ENCOUNTER — Ambulatory Visit: Payer: BC Managed Care – PPO | Attending: Internal Medicine

## 2022-05-21 DIAGNOSIS — I428 Other cardiomyopathies: Secondary | ICD-10-CM

## 2022-05-21 LAB — CUP PACEART INCLINIC DEVICE CHECK
Date Time Interrogation Session: 20231226133156
Implantable Lead Connection Status: 753985
Implantable Lead Connection Status: 753985
Implantable Lead Implant Date: 20190301
Implantable Lead Implant Date: 20190301
Implantable Lead Location: 753858
Implantable Lead Location: 753860
Implantable Pulse Generator Implant Date: 20231222
Pulse Gen Serial Number: 285896

## 2022-05-21 MED FILL — Lidocaine HCl Local Inj 1%: INTRAMUSCULAR | Qty: 60 | Status: AC

## 2022-05-21 NOTE — Telephone Encounter (Signed)
The patient called stating he is feeling SOB and really tired like he do not have the energy when he tries to move around. I could not get a transmission from his monitor. I told him the nurse will give him a call and bring him into the office today. I also told him to bring the monitor so I can help him with the monitor in the office.

## 2022-05-21 NOTE — Patient Instructions (Addendum)
Adjustments were made to your device today to help with your symptoms.    Please call the device clinic at [phone] if you have any concerns or questions.  We are open Monday - Friday 8am-5pm.  We are closed on Monday 05/27/22 in observance of New Years.   If any emergent symptoms: Chest pain, Shortness of Breath that is worsening or severe or feeling of passing out, or actual passing out, call 911 and go to the ER.

## 2022-05-21 NOTE — Progress Notes (Signed)
Chris with [employer] rep performed device check today. Patient complaints of significant SOB with any level of ambulation with severe fatigue. Post BIV upgrade from medtronic gen to [facility] CRT-D. Reviewed settings from [employer] gen at explant (05/16/22) to assist.    The following programming changes were made:  Programmed Normal Loletha Grayer LV-Amplitude Auto 4.0 V Programmed Normal Brady LV-Pulse Width 1.0 ms 1.5 ms Also, assisted patient with sync and connection with remote device patient verbalizes understanding of all instructions given today  Patient will follow up next week on 1/4 for wound check and to determine if sx's have improved.

## 2022-05-21 NOTE — Telephone Encounter (Signed)
Spoke with patient.  He is having significant SOB with any level of exertion.  Also reports significant fatigue, not feeling well since implant on 12/22 None at rest. Denies CP, or dizziness.  Patient is coming in at [place] today to device clinic. Kem, CMA, to work on helping him send a transmission. Dr. Caryl Comes present in office today.   Patient aware and verbalizes understanding.

## 2022-05-23 NOTE — Progress Notes (Signed)
Remote ICD transmission.   

## 2022-05-23 NOTE — Addendum Note (Signed)
Addended by: Douglass Rivers D on: 05/23/2022 03:53 PM   Modules accepted: Level of Service

## 2022-05-29 ENCOUNTER — Encounter: Payer: HMO | Admitting: Internal Medicine

## 2022-05-30 ENCOUNTER — Telehealth: Payer: Self-pay

## 2022-05-30 ENCOUNTER — Ambulatory Visit: Payer: PPO | Attending: Internal Medicine

## 2022-05-30 DIAGNOSIS — I428 Other cardiomyopathies: Secondary | ICD-10-CM

## 2022-05-30 NOTE — Patient Instructions (Addendum)
   After Your ICD (Implantable Cardiac Defibrillator)    Monitor your defibrillator site for redness, swelling, and drainage. Call the device clinic at [phone] if you experience these symptoms or fever/chills.  Your incision was closed with Steri-strips or staples:  You may shower 7 days after your procedure and wash your incision with soap and water. Avoid lotions, ointments, or perfumes over your incision until it is well-healed.  You still have one area that is healing at your incision site.  Steri strip re-applied okay to shower after tomorrow. Keep area clean and dry otherwise.   Appointment next Thursday, January 11th at 12:00pm for wound recheck.   You may use a hot tub or a pool after your wound check appointment if the incision is completely closed.   Your ICD is designed to protect you from life threatening heart rhythms. Because of this, you may receive a shock.   1 shock with no symptoms:  Call the office during business hours. 1 shock with symptoms (chest pain, chest pressure, dizziness, lightheadedness, shortness of breath, overall feeling unwell):  Call 911. If you experience 2 or more shocks in 24 hours:  Call 911. If you receive a shock, you should not drive.  [place] DMV - no driving for 6 months if you receive appropriate therapy from your ICD.   ICD Alerts:  Some alerts are vibratory and others beep. These are NOT emergencies. Please call our office to let us know. If this occurs at night or on weekends, it can wait until the next business day. Send a remote transmission.  If your device is capable of reading fluid status (for heart failure), you will be offered monthly monitoring to review this with you.   Remote monitoring is used to monitor your ICD from home. This monitoring is scheduled every 91 days by our office. It allows [place] to keep an eye on the functioning of your device to ensure it is working properly. You will routinely see your Electrophysiologist  annually (more often if necessary).

## 2022-05-30 NOTE — Telephone Encounter (Signed)
Left message for return call.

## 2022-06-03 DIAGNOSIS — I5040 Unspecified combined systolic (congestive) and diastolic (congestive) heart failure: Secondary | ICD-10-CM | POA: Diagnosis not present

## 2022-06-03 DIAGNOSIS — I4819 Other persistent atrial fibrillation: Secondary | ICD-10-CM | POA: Diagnosis not present

## 2022-06-03 DIAGNOSIS — N184 Chronic kidney disease, stage 4 (severe): Secondary | ICD-10-CM | POA: Diagnosis not present

## 2022-06-03 DIAGNOSIS — D6869 Other thrombophilia: Secondary | ICD-10-CM | POA: Diagnosis not present

## 2022-06-03 DIAGNOSIS — E1122 Type 2 diabetes mellitus with diabetic chronic kidney disease: Secondary | ICD-10-CM | POA: Diagnosis not present

## 2022-06-03 DIAGNOSIS — Z7985 Long-term (current) use of injectable non-insulin antidiabetic drugs: Secondary | ICD-10-CM | POA: Diagnosis not present

## 2022-06-03 DIAGNOSIS — Z7901 Long term (current) use of anticoagulants: Secondary | ICD-10-CM | POA: Diagnosis not present

## 2022-06-03 DIAGNOSIS — E1151 Type 2 diabetes mellitus with diabetic peripheral angiopathy without gangrene: Secondary | ICD-10-CM | POA: Diagnosis not present

## 2022-06-03 DIAGNOSIS — Z794 Long term (current) use of insulin: Secondary | ICD-10-CM | POA: Diagnosis not present

## 2022-06-03 DIAGNOSIS — Z6825 Body mass index (BMI) 25.0-25.9, adult: Secondary | ICD-10-CM | POA: Diagnosis not present

## 2022-06-03 DIAGNOSIS — N2581 Secondary hyperparathyroidism of renal origin: Secondary | ICD-10-CM | POA: Diagnosis not present

## 2022-06-05 NOTE — Progress Notes (Addendum)
Wound check appointment. Steri-strips removed. Wound without redness or edema, there is noted small open area at incision line, no s/s of infection.  Re-applied steri strip and patient to return in 1 week for recheck on healing.  Appt made for 06/06/22.    Normal device function. Thresholds, sensing, and impedances consistent with implant measurements. Device programmed at 3.5V for extra safety margin until 3 month visit. Histogram distribution appropriate for patient and level of activity. No mode switches or ventricular arrhythmias noted. Patient educated about wound care, arm mobility, lifting restrictions, shock plan. ROV in 3 months with implanting physician. check today performed with industry rep, Joey.  the following changes were made: 1.  Sleep duration  changed from 0.7 hr to 0.4 hr 2.  Sleep start time changed from 23:00 to 01:00.

## 2022-06-06 ENCOUNTER — Ambulatory Visit: Payer: PPO | Attending: Cardiology

## 2022-06-06 DIAGNOSIS — I428 Other cardiomyopathies: Secondary | ICD-10-CM

## 2022-06-06 LAB — CUP PACEART INCLINIC DEVICE CHECK
Battery Remaining Longevity: 96 mo
Date Time Interrogation Session: 20240104091035
HighPow Impedance: 64 Ohm
Implantable Lead Connection Status: 753985
Implantable Lead Connection Status: 753985
Implantable Lead Implant Date: 20190301
Implantable Lead Implant Date: 20190301
Implantable Lead Location: 753858
Implantable Lead Location: 753860
Implantable Pulse Generator Implant Date: 20231222
Lead Channel Impedance Value: 582 Ohm
Lead Channel Impedance Value: 786 Ohm
Lead Channel Pacing Threshold Amplitude: 0.3 V
Lead Channel Pacing Threshold Amplitude: 2.3 V
Lead Channel Pacing Threshold Pulse Width: 0.4 ms
Lead Channel Pacing Threshold Pulse Width: 1.5 ms
Pulse Gen Serial Number: 285896

## 2022-06-06 NOTE — Patient Instructions (Signed)
Follow up as scheduled.  

## 2022-06-06 NOTE — Progress Notes (Signed)
Device clinic appointment to recheck wound site.  Wound without redness or edema. Incision edges approximated, wound well healed.  Pt advised to continue to monitor site and call if any changes.  Pt will follow up as scheduled.

## 2022-06-07 NOTE — Progress Notes (Signed)
Remote ICD transmission.   

## 2022-06-17 ENCOUNTER — Ambulatory Visit (INDEPENDENT_AMBULATORY_CARE_PROVIDER_SITE_OTHER): Payer: PPO | Admitting: Family Medicine

## 2022-06-17 ENCOUNTER — Encounter: Payer: Self-pay | Admitting: Family Medicine

## 2022-06-17 VITALS — BP 136/72 | HR 70 | Wt 210.0 lb

## 2022-06-17 DIAGNOSIS — R3915 Urgency of urination: Secondary | ICD-10-CM

## 2022-06-17 DIAGNOSIS — N184 Chronic kidney disease, stage 4 (severe): Secondary | ICD-10-CM | POA: Diagnosis not present

## 2022-06-17 DIAGNOSIS — I10 Essential (primary) hypertension: Secondary | ICD-10-CM

## 2022-06-17 DIAGNOSIS — Z794 Long term (current) use of insulin: Secondary | ICD-10-CM | POA: Diagnosis not present

## 2022-06-17 DIAGNOSIS — E119 Type 2 diabetes mellitus without complications: Secondary | ICD-10-CM | POA: Diagnosis not present

## 2022-06-17 DIAGNOSIS — I5022 Chronic systolic (congestive) heart failure: Secondary | ICD-10-CM

## 2022-06-17 LAB — POCT GLYCOSYLATED HEMOGLOBIN (HGB A1C): HbA1c, POC (controlled diabetic range): 11 % — AB (ref 0.0–7.0)

## 2022-06-17 MED ORDER — NOVOLOG FLEXPEN 100 UNIT/ML ~~LOC~~ SOPN: PEN_INJECTOR | SUBCUTANEOUS | 11 refills | Status: DC

## 2022-06-17 MED ORDER — TRESIBA FLEXTOUCH 200 UNIT/ML ~~LOC~~ SOPN: 30.0000 [IU] | PEN_INJECTOR | Freq: Every day | SUBCUTANEOUS | 3 refills | Status: DC

## 2022-06-17 NOTE — Patient Instructions (Signed)
Increase your once a day insulin to 30 units every day Increase your meal coverage to 10 units for breakfast and lunch and 15 units with dinner. Stay off the trulicity since you can't afford in.   Come in one more time - try to get an appoint for 2/14 with me.  Bring your meter in.   I will call with the kidney test results.

## 2022-06-18 ENCOUNTER — Encounter: Payer: Self-pay | Admitting: Family Medicine

## 2022-06-18 DIAGNOSIS — R3915 Urgency of urination: Secondary | ICD-10-CM | POA: Insufficient documentation

## 2022-06-18 LAB — BASIC METABOLIC PANEL
BUN/Creatinine Ratio: 11 (ref 10–24)
BUN: 23 mg/dL (ref 8–27)
CO2: 23 mmol/L (ref 20–29)
Calcium: 8.8 mg/dL (ref 8.6–10.2)
Chloride: 95 mmol/L — ABNORMAL LOW (ref 96–106)
Creatinine, Ser: 2.1 mg/dL — ABNORMAL HIGH (ref 0.76–1.27)
Glucose: 409 mg/dL — ABNORMAL HIGH (ref 70–99)
Potassium: 3.9 mmol/L (ref 3.5–5.2)
Sodium: 135 mmol/L (ref 134–144)
eGFR: 33 mL/min/{1.73_m2} — ABNORMAL LOW (ref >59)

## 2022-06-18 NOTE — Assessment & Plan Note (Signed)
Terrible control off trulicity.  We decided on a significant bump in both long acting and short acting insulins.  FU in one month.  Check blood sugar at home.  Bring in meter so I can review at next visit.

## 2022-06-18 NOTE — Assessment & Plan Note (Signed)
At dry weight.  No change in therapy.

## 2022-06-18 NOTE — Assessment & Plan Note (Signed)
He is borderline between 3b and 4.  Nicely stable.

## 2022-06-18 NOTE — Progress Notes (Signed)
    SUBJECTIVE:   CHIEF COMPLAINT / HPI:   FU multiple problems.   Hypertension.  Tolerating meds well.  BP today is great. CKD 3b.  Denies swelling or sOB.  Due for a recheck. DM.  Not taking trulicity, he cannot afford.  We were both disappointed to learn his A1C today was 11.0 Urinary urgency.  He can only hold urine a max of 10 minutes once he feels the first urge.  If he cannot get to the bathroom, he will become incontinent. HFrEF.  Denies DOE.  Reviewed wts.  Seems to be at his dry weight.    OBJECTIVE:   BP 136/72   Pulse 70   Wt 210 lb (95.3 kg)   SpO2 97%   BMI 28.48 kg/m   Lungs clear Cardiac RRR without m or g  ASSESSMENT/PLAN:   Chronic kidney disease (CKD), stage IV (severe) (HCC) He is borderline between 3b and 4.  Nicely stable.  Congestive heart failure, NYHA class 3, chronic, systolic (HCC) At dry weight.  No change in therapy.  HYPERTENSION, BENIGN SYSTEMIC Nicely controled on current meds.  Insulin dependent type 2 diabetes mellitus ([place]) Terrible control off trulicity.  We decided on a significant bump in both long acting and short acting insulins.  FU in one month.  Check blood sugar at home.  Bring in meter so I can review at next visit.  Urinary urgency For now, attribute to polyuria of poorly controled DM.  Further WU if this urgency remains a problem once we have gotten better DM control.     Zenia Resides, MD [place]

## 2022-06-18 NOTE — Assessment & Plan Note (Signed)
For now, attribute to polyuria of poorly controled DM.  Further WU if this urgency remains a problem once we have gotten better DM control.

## 2022-06-18 NOTE — Assessment & Plan Note (Signed)
Nicely controled on current meds.

## 2022-06-23 ENCOUNTER — Other Ambulatory Visit: Payer: Self-pay | Admitting: Family Medicine

## 2022-06-25 ENCOUNTER — Telehealth: Payer: Self-pay

## 2022-06-25 NOTE — Telephone Encounter (Signed)
..  Pre-operative Risk Assessment    Patient Name: Anthony [name]  DOB: 1951/02/18 MRN: [id number]      Request for Surgical Clearance    Procedure:   LUMBAR EPIDURAL STEROID INJECTION  Date of Surgery:  Clearance TBD                                 Surgeon:  Melina Schools Surgeon's Group or Practice Name:  [facility] Phone number:  [phone] Fax number:  [phone]   Type of Clearance Requested:   - Medical  - Pharmacy:  Hold Apixaban (Eliquis)     Type of Anesthesia:  None    Additional requests/questions:    Gwenlyn Found   06/25/2022, 12:21 PM

## 2022-06-25 NOTE — Telephone Encounter (Signed)
Patient with diagnosis of A Fib on Eliquis for anticoagulation.    Procedure:  LUMBAR EPIDURAL STEROID INJECTION  Date of procedure: TBD   CHA2DS2-VASc Score = 7  This indicates a 11.2% annual risk of stroke. The patient's score is based upon: CHF History: 1 HTN History: 1 Diabetes History: 1 Stroke History: 2 Vascular Disease History: 1 Age Score: 1 Gender Score: 0    CrCl 44 mL/min Platelet count 136K  Patient will require holding Eliquis for 3 days before spinal procedure. Due to history of CVA, will route to Dr Johney Frame for input  **This guidance is not considered finalized until pre-operative APP has relayed final recommendations.**

## 2022-06-27 NOTE — Telephone Encounter (Signed)
Patient will need a Lovenox bridge before spinal injection. When scheduled, please route back to PharmD pool to organize

## 2022-06-28 ENCOUNTER — Telehealth: Payer: Self-pay | Admitting: *Deleted

## 2022-06-28 NOTE — Telephone Encounter (Signed)
I s/w the pt and I explained that I s/w Dr. Rolena Infante office and that we really needed to have a date before we could proceed as he is going to need Lovenox bridging. I offered the two date that [facility] with Dr. Rolena Infante office gave me 07/10/22 or 07/17/22. Pt opted for 07/10/22. I assured the pt that Mariann Laster will call him with a time for 07/10/22 procedure. Pt said ok. Pt has also been scheduled for tele pre op appt 07/04/22 @ 2:40, med rec and consent are done. Pt is aware pharm-d will call to make appt for Lovenox Bridge.   I then called Mariann Laster back and let her know that the pt opted for 07/10/22 and that I will fax over an [place] to update all notes from today.     Patient Consent for Virtual Visit        Anthony [name] has provided verbal consent on 06/28/2022 for a virtual visit (video or telephone).   CONSENT FOR VIRTUAL VISIT FOR:  Anthony [name]  By participating in this virtual visit I agree to the following:  I hereby voluntarily request, consent and authorize [facility] and its employed or contracted physicians, physician assistants, nurse practitioners or other licensed health care professionals (the Practitioner), to provide me with telemedicine health care services (the "Services") as deemed necessary by the treating Practitioner. I acknowledge and consent to receive the Services by the Practitioner via telemedicine. I understand that the telemedicine visit will involve communicating with the Practitioner through live audiovisual communication technology and the disclosure of certain medical information by electronic transmission. I acknowledge that I have been given the opportunity to request an in-person assessment or other available alternative prior to the telemedicine visit and am voluntarily participating in the telemedicine visit.  I understand that I have the right to withhold or withdraw my consent to the use of telemedicine in the course of my care at any time, without affecting my  right to future care or treatment, and that the Practitioner or I may terminate the telemedicine visit at any time. I understand that I have the right to inspect all information obtained and/or recorded in the course of the telemedicine visit and may receive copies of available information for a reasonable fee.  I understand that some of the potential risks of receiving the Services via telemedicine include:  Delay or interruption in medical evaluation due to technological equipment failure or disruption; Information transmitted may not be sufficient (e.g. poor resolution of images) to allow for appropriate medical decision making by the Practitioner; and/or  In rare instances, security protocols could fail, causing a breach of personal health information.  Furthermore, I acknowledge that it is my responsibility to provide information about my medical history, conditions and care that is complete and accurate to the best of my ability. I acknowledge that Practitioner's advice, recommendations, and/or decision may be based on factors not within their control, such as incomplete or inaccurate data provided by me or distortions of diagnostic images or specimens that may result from electronic transmissions. I understand that the practice of medicine is not an exact science and that Practitioner makes no warranties or guarantees regarding treatment outcomes. I acknowledge that a copy of this consent can be made available to me via my patient portal ([facility]), or I can request a printed copy by calling the office of [facility].    I understand that my insurance will be billed for this visit.  I have read or had this consent read to me. I understand the contents of this consent, which adequately explains the benefits and risks of the Services being provided via telemedicine.  I have been provided ample opportunity to ask questions regarding this consent and the Services and have had my  questions answered to my satisfaction. I give my informed consent for the services to be provided through the use of telemedicine in my medical care

## 2022-06-28 NOTE — Telephone Encounter (Signed)
I s/w the pt and I explained that I s/w Dr. Rolena Infante office and that we really needed to have a date before we could proceed as he is going to need Lovenox bridging. I offered the two date that [facility] with Dr. Rolena Infante office gave me 07/10/22 or 07/17/22. Pt opted for 07/10/22. I assured the pt that Mariann Laster will call him with a time for 07/10/22 procedure. Pt said ok. Pt has also been scheduled for tele pre op appt 07/04/22 @ 2:40, med rec and consent are done. Pt is aware pharm-d will call to make appt for Lovenox Bridge.    I then called Mariann Laster back and let her know that the pt opted for 07/10/22 and that I will fax over an [place] to update all notes from today.

## 2022-06-28 NOTE — Telephone Encounter (Signed)
Please arrange a virtual telephone visit for clearance.  Please also inform the patient that Dr. Johney Frame recommended Lovenox bridging when he come off of Eliquis for 3 days, therefore once he knows the date of the procedure, he will need to let us know so our clinical pharmacist can send in the Lovenox.

## 2022-07-01 DIAGNOSIS — E1129 Type 2 diabetes mellitus with other diabetic kidney complication: Secondary | ICD-10-CM | POA: Diagnosis not present

## 2022-07-01 DIAGNOSIS — Z794 Long term (current) use of insulin: Secondary | ICD-10-CM | POA: Diagnosis not present

## 2022-07-01 DIAGNOSIS — I25118 Atherosclerotic heart disease of native coronary artery with other forms of angina pectoris: Secondary | ICD-10-CM | POA: Diagnosis not present

## 2022-07-01 DIAGNOSIS — N184 Chronic kidney disease, stage 4 (severe): Secondary | ICD-10-CM | POA: Diagnosis not present

## 2022-07-01 DIAGNOSIS — Z89422 Acquired absence of other left toe(s): Secondary | ICD-10-CM | POA: Diagnosis not present

## 2022-07-01 DIAGNOSIS — G40909 Epilepsy, unspecified, not intractable, without status epilepticus: Secondary | ICD-10-CM | POA: Diagnosis not present

## 2022-07-01 DIAGNOSIS — I110 Hypertensive heart disease with heart failure: Secondary | ICD-10-CM | POA: Diagnosis not present

## 2022-07-01 DIAGNOSIS — E1122 Type 2 diabetes mellitus with diabetic chronic kidney disease: Secondary | ICD-10-CM | POA: Diagnosis not present

## 2022-07-01 DIAGNOSIS — D6869 Other thrombophilia: Secondary | ICD-10-CM | POA: Diagnosis not present

## 2022-07-01 DIAGNOSIS — E1151 Type 2 diabetes mellitus with diabetic peripheral angiopathy without gangrene: Secondary | ICD-10-CM | POA: Diagnosis not present

## 2022-07-01 DIAGNOSIS — E261 Secondary hyperaldosteronism: Secondary | ICD-10-CM | POA: Diagnosis not present

## 2022-07-01 DIAGNOSIS — I4891 Unspecified atrial fibrillation: Secondary | ICD-10-CM | POA: Diagnosis not present

## 2022-07-02 ENCOUNTER — Telehealth: Payer: Self-pay | Admitting: Pharmacist

## 2022-07-02 ENCOUNTER — Encounter: Payer: Self-pay | Admitting: Family Medicine

## 2022-07-02 DIAGNOSIS — I4821 Permanent atrial fibrillation: Secondary | ICD-10-CM

## 2022-07-02 DIAGNOSIS — I639 Cerebral infarction, unspecified: Secondary | ICD-10-CM

## 2022-07-02 MED ORDER — ENOXAPARIN SODIUM 150 MG/ML IJ SOSY: PREFILLED_SYRINGE | INTRAMUSCULAR | 0 refills | Status: DC

## 2022-07-02 NOTE — Telephone Encounter (Signed)
Lovenox bridge instructions  2/10: Last day of Eliquis 2/11: Inject enoxaparin '150mg'$  sq in the morning 2/12: Inject enoxaparin '150mg'$  sq in the morning 2/13: Inject enoxaparin '150mg'$  sq in the morning 2/14 - Procedure day  Called patient and gave instructions over the phone. Pt voiced understanding.

## 2022-07-02 NOTE — Telephone Encounter (Signed)
-----   Message from Rollen Sox, [facility] sent at 06/28/2022  5:14 PM EST ----- Regarding: lovenox Contact patient for bridge

## 2022-07-03 NOTE — Progress Notes (Unsigned)
Virtual Visit via Telephone Note   Because of Anthony [name]'s co-morbid illnesses, he is at least at moderate risk for complications without adequate follow up.  This format is felt to be most appropriate for this patient at this time.  The patient did not have access to video technology/had technical difficulties with video requiring transitioning to audio format only (telephone).  All issues noted in this document were discussed and addressed.  No physical exam could be performed with this format.  Please refer to the patient's chart for his consent to telehealth for [facility].  Evaluation Performed:  Preoperative cardiovascular risk assessment _____________   Date:  07/03/2022   Patient ID:  Anthony [name], DOB April 09, 1951, MRN [id number] Patient Location:  Home Provider location:   Office  Primary Care Provider:  Zenia Resides, MD Primary Cardiologist:  Freada Bergeron, MD  Chief Complaint / Patient Profile   72 y.o. y/o male with a h/o atrial fibrillation, AV ablation, ICD placement, CAD s/p PCI to circumflex lesion in 2013, CKD stage IV, chronic systolic and diastolic CHF, OSA who is pending lumbar epidural steroid injection and presents today for telephonic preoperative cardiovascular risk assessment.  History of Present Illness    Anthony [name] is a 72 y.o. male who presents via audio/video conferencing for a telehealth visit today.  Pt was last seen in cardiology clinic on 02/18/2022 by Dr. Johney Frame.  At that time Anthony [name] was doing well with no complaints of chest pain but had back pain and required ESI at that time.  The patient is now pending procedure as outlined above. Since his last visit, he reports that he is doing well with no new cardiac complaints.  He had a generator change out for his ICD completed in December that went well with no complications.  He denies chest pain, shortness of breath, lower extremity edema, fatigue, palpitations,  melena, hematuria, hemoptysis, diaphoresis, weakness, presyncope, syncope, orthopnea, and PND.     Past Medical History    Past Medical History:  Diagnosis Date   Arthritis    Benign neoplasm of descending colon    Benign neoplasm of sigmoid colon    Benign neoplasm of transverse colon    CAD in native artery    a. reported MI 2000, 2001, s/p stenting of the circumflex lesion extending into the second obtuse marginal branch with a drug-eluting stent in 2003. b. low risk nuc 2015.   Chronic atrial fibrillation (HCC)    Chronic combined systolic and diastolic CHF (congestive heart failure) ([place])    a. Previously diastolic, then EF 26-20% in 10/2016.   CKD (chronic kidney disease), stage III (HCC)    Depression    Diabetes mellitus    Edema of extremities    Erectile dysfunction    GERD (gastroesophageal reflux disease)    Hyperlipidemia    Hypertension    Myocardial infarction ([place]) 2000&2001   Obesity    Onychomycosis    OSA (obstructive sleep apnea)    S/P ICD (internal cardiac defibrillator) procedure and BiV device,  07/25/17 Medtronic  07/26/2017   Seizures ([place])    Sleep apnea    Stroke ([place])    Tubular adenoma of colon 10/2002   Past Surgical History:  Procedure Laterality Date   ANGIOPLASTY  2001   stent x 1   AV NODE ABLATION N/A 08/20/2017   Procedure: AV NODE ABLATION;  Surgeon: Deboraha Sprang, MD;  Location: [facility];  Service: Cardiovascular;  Laterality: N/A;   BIV ICD GENERATOR CHANGEOUT N/A 05/17/2022   Procedure: BIV ICD GENERATOR CHANGEOUT;  Surgeon: Deboraha Sprang, MD;  Location: [facility];  Service: Cardiovascular;  Laterality: N/A;   BIV ICD INSERTION CRT-D N/A 07/25/2017   Procedure: BIV ICD INSERTION CRT-D;  Surgeon: Deboraha Sprang, MD;  Location: [facility];  Service: Cardiovascular;  Laterality: N/A;   COLONOSCOPY  2000?   negative   COLONOSCOPY WITH PROPOFOL N/A 02/08/2019   Procedure: COLONOSCOPY WITH PROPOFOL;  Surgeon:  Ladene Artist, MD;  Location: [facility];  Service: Endoscopy;  Laterality: N/A;   FOOT ARTHROTOMY Right    POLYPECTOMY  02/08/2019   Procedure: POLYPECTOMY;  Surgeon: Ladene Artist, MD;  Location: [facility];  Service: Endoscopy;;   TOE AMPUTATION Left 2012    Allergies  Allergies  Allergen Reactions   Enalapril Maleate Cough    Home Medications    Prior to Admission medications   Medication Sig Start Date End Date Taking? Authorizing Provider  acetaminophen (TYLENOL) 650 MG CR tablet Take 1,300 mg by mouth 2 (two) times daily as needed for pain.    [provider]  apixaban (ELIQUIS) 5 MG TABS tablet Take 1 tablet (5 mg total) by mouth 2 (two) times daily. 02/05/22   Leavy Cella, RPH-CPP  carvedilol (COREG) 25 MG tablet TAKE 1 TABLET 2 TIMES DAILY. 02/18/22   Freada Bergeron, MD  Continuous Blood Gluc Receiver (FREESTYLE LIBRE 14 DAY READER) DEVI Apply topically as directed. 12/29/19   [provider]  Continuous Blood Gluc Sensor (FREESTYLE LIBRE 14 DAY SENSOR) MISC 1 Device by Does not apply route every 14 (fourteen) days. 01/07/22   Zenia Resides, MD  diclofenac Sodium (VOLTAREN) 1 % GEL Apply 4 g topically 4 (four) times daily. 12/13/21   Zenia Resides, MD  enoxaparin (LOVENOX) 150 MG/ML injection Inject the contents of one syringe under the skin in the morning on 2/11, 2/12, and 2/13 07/02/22   Freada Bergeron, MD  furosemide (LASIX) 40 MG tablet Take 1 tablet (40 mg total) by mouth daily. 03/28/22 03/23/23  Zenia Resides, MD  gabapentin (NEURONTIN) 300 MG capsule Take 1 capsule (300 mg total) by mouth at bedtime. 01/07/22   Zenia Resides, MD  hydrALAZINE (APRESOLINE) 50 MG tablet Take 1 tablet (50 mg total) by mouth 3 (three) times daily. 02/18/22   Freada Bergeron, MD  insulin aspart (NOVOLOG FLEXPEN) 100 UNIT/ML FlexPen Inject10 units before breakfast and lunch and 15 units before dinner. 06/17/22   Zenia Resides, MD  insulin  degludec (TRESIBA FLEXTOUCH) 200 UNIT/ML FlexTouch Pen Inject 30 Units into the skin daily. 06/17/22   Zenia Resides, MD  Insulin Pen Needle (PEN NEEDLES) 32G X 4 MM MISC 1 Device by Does not apply route as directed. 12/13/21   Zenia Resides, MD  Lancets [facility] ULTRASOFT) lancets Use as instructed 01/13/19   [name], MD  loratadine (CLARITIN) 10 MG tablet Take 10 mg by mouth daily as needed for allergies.    [provider]  losartan (COZAAR) 25 MG tablet Take 1 tablet (25 mg total) by mouth daily. 02/18/22   Freada Bergeron, MD  Multiple Vitamin (MULTIVITAMIN) tablet Take 1 tablet by mouth daily.      [provider]  nitroGLYCERIN (NITROSTAT) 0.4 MG SL tablet Place 1 tablet (0.4 mg total) under the tongue every 5 (five) minutes as needed  for chest pain. If no relief by 3rd tab, call 911 Patient not taking: Reported on 06/28/2022 09/16/19   Zenia Resides, MD  omeprazole (PRILOSEC) 40 MG capsule TAKE ONE CAPSULE BY MOUTH DAILY BEFORE SUPPER 02/11/22   Zenia Resides, MD  oxymorphone (OPANA) 5 MG tablet Take 10-15 mg by mouth in the morning and at bedtime. Per Dr Alvin Critchley, MD  PARoxetine (PAXIL) 30 MG tablet TAKE 1 TABLET BY MOUTH EVERY DAY 06/24/22   Zenia Resides, MD  potassium chloride SA (KLOR-CON M20) 20 MEQ tablet Take 1 tablet (20 mEq total) by mouth daily. 11/01/21   Zenia Resides, MD  rosuvastatin (CRESTOR) 20 MG tablet Take 1 tablet (20 mg total) by mouth daily. 02/18/22   Freada Bergeron, MD  tamsulosin (FLOMAX) 0.4 MG CAPS capsule TAKE 1 CAPSULE BY MOUTH EVERYDAY AT BEDTIME 11/01/21   Zenia Resides, MD    Physical Exam    Vital Signs:  Anthony [name] does not have vital signs available for review today.  Given telephonic nature of communication, physical exam is limited. AAOx3. NAD. Normal affect.  Speech and respirations are unlabored.  Accessory Clinical Findings    None  Assessment & Plan    1.  Preoperative  Cardiovascular Risk Assessment:  The patient affirms he has been doing well without any new cardiac symptoms. They are able to achieve 4 METS without cardiac limitations. Therefore, based on ACC/AHA guidelines, the patient would be at acceptable risk for the planned procedure without further cardiovascular testing. The patient was advised that if he develops new symptoms prior to surgery to contact our office to arrange for a follow-up visit, and he verbalized understanding.    Anthony [name] perioperative risk of a major cardiac event is 11% according to the Revised Cardiac Risk Index (RCRI).  Therefore, he is at high risk for perioperative complications.   His functional capacity is fair at 4.31 METs according to the [place] Activity Status Index (DASI). Recommendations: According to ACC/AHA guidelines, no further cardiovascular testing needed.  The patient may proceed to surgery at acceptable risk.   Antiplatelet and/or Anticoagulation Recommendations:  Patient was informed that Eliquis can be held for 3 days prior to procedure however he will require bridging with Lovenox during 3-day period.  He reports that he has Lovenox ejection currently at his pharmacy.  The patient was advised that if he develops new symptoms prior to surgery to contact our office to arrange for a follow-up visit, and he verbalized understanding.  Time:   Today, I have spent 6 minutes with the patient with telehealth technology discussing medical history, symptoms, and management plan.     Mable Fill, Marissa Nestle, NP  07/03/2022, 8:56 PM

## 2022-07-04 ENCOUNTER — Ambulatory Visit: Payer: PPO | Attending: Cardiology

## 2022-07-04 DIAGNOSIS — Z01810 Encounter for preprocedural cardiovascular examination: Secondary | ICD-10-CM

## 2022-07-04 NOTE — Telephone Encounter (Addendum)
Pt with preop appt today, seemed confused about Lovenox. Called pt to discuss bridge again. Will also shift instructions so last pre-procedure dose is given a bit earlier due to his CKD and higher 1.'5mg'$ /kg once daily dosing. Reviewed below instructions with pt:  2/10: Last dose of Eliquis in AM. Inject Lovenox '150mg'$  in PM. 2/11: Inject Lovenox '150mg'$  in PM. No Eliquis. 2/12: Inject Lovenox '150mg'$  in PM. No Eliquis. 2/13: No Lovenox, no Eliquis. 2/14 - Procedure day. No Lovenox, no Eliquis. Resume Eliquis when advised by surgeon.  Pt recited instructions back to me and had no further questions.

## 2022-07-10 ENCOUNTER — Ambulatory Visit: Payer: PPO | Admitting: Family Medicine

## 2022-07-18 DIAGNOSIS — E1122 Type 2 diabetes mellitus with diabetic chronic kidney disease: Secondary | ICD-10-CM | POA: Diagnosis not present

## 2022-07-18 DIAGNOSIS — I129 Hypertensive chronic kidney disease with stage 1 through stage 4 chronic kidney disease, or unspecified chronic kidney disease: Secondary | ICD-10-CM | POA: Diagnosis not present

## 2022-07-18 DIAGNOSIS — D631 Anemia in chronic kidney disease: Secondary | ICD-10-CM | POA: Diagnosis not present

## 2022-07-18 DIAGNOSIS — N2581 Secondary hyperparathyroidism of renal origin: Secondary | ICD-10-CM | POA: Diagnosis not present

## 2022-07-18 DIAGNOSIS — N1832 Chronic kidney disease, stage 3b: Secondary | ICD-10-CM | POA: Diagnosis not present

## 2022-07-24 ENCOUNTER — Other Ambulatory Visit: Payer: Self-pay | Admitting: Family Medicine

## 2022-07-24 DIAGNOSIS — E119 Type 2 diabetes mellitus without complications: Secondary | ICD-10-CM

## 2022-07-29 ENCOUNTER — Telehealth: Payer: Self-pay | Admitting: Pharmacist

## 2022-07-29 DIAGNOSIS — E119 Type 2 diabetes mellitus without complications: Secondary | ICD-10-CM

## 2022-07-29 MED ORDER — TRESIBA FLEXTOUCH 200 UNIT/ML ~~LOC~~ SOPN: 22.0000 [IU] | PEN_INJECTOR | Freq: Every day | SUBCUTANEOUS | 3 refills | Status: DC

## 2022-07-29 NOTE — Telephone Encounter (Signed)
Patient called and left voice mail requesting refill of Tresiba (insulin degludec).   Patient contacted to clarify dose.  Current dose is 22 units once daily.   New prescription - provided  Asked to schedule with new PCP.   Visit scheduled.

## 2022-07-30 NOTE — Telephone Encounter (Signed)
Reviewed and agree with Dr Graylin Shiver plan.

## 2022-08-15 NOTE — Progress Notes (Deleted)
Cardiology Office Note:    Date:  08/15/2022   ID:  Anthony [name] [birth year], MRN [id number]  PCP:  Lenoria Chime, MD   [facility] Providers Cardiologist:  Freada Bergeron, MD Electrophysiologist:  Virl Axe, MD  { Referring MD: Anthony Resides, MD   History of Present Illness:    Anthony [name] is a 72 y.o. male with a hx of atrial fibrillation, AV ablation 07/2017 with PM/ICD placement, CAD s/p PCI in 2013 of circumflex lesion extending into second obtuse marginal branch with DES, hypertension, CKD 4, chronic systolic and diastolic heart failure, OSA who was previously followed by Dr. Meda Coffee who presents to clinic for follow-up.  Previous cardiac catheterization in 2013 with DES to circumflex extending into second obtuse marginal branch. Myoview 2007 with no ischemia.  Echocardiogram 2018 LVEF 25 to 30% improved in 2019 to 55-60%.  Previous testing for amyloid with negative PYP.  Seen by Dr. Meda Coffee 06/2020 with no edema, orthopnea, PND.  No changes were made at that time.  Hospitalized 01/2021 with leg weakness which normalized and neurology was consulted.     Saw Dr. Caryl Comes 03/26/2021 noted marked dyspnea on exertion.  He was compliant with CPAP.  His same regimen was continued.  He was seen 04/16/2021. Noted to have elevated OptiVol level that morning by ICD check.  He noted dyspnea and lower extremity edema but no orthopnea nor PND.  He was recommended to start Lasix 40 mg daily as well as potassium.  Had ICD generator change 04/2023 where he did well.  He was last seen 07/04/22 for preop evaluation for epidural steroid injection. Was doing well at that time.  Today, ***   Past Medical History:  Diagnosis Date   Arthritis    Benign neoplasm of descending colon    Benign neoplasm of sigmoid colon    Benign neoplasm of transverse colon    CAD in native artery    a. reported MI 2000, 2001, s/p stenting of the circumflex lesion extending into the second obtuse  marginal branch with a drug-eluting stent in 2003. b. low risk nuc 2015.   Chronic atrial fibrillation (HCC)    Chronic combined systolic and diastolic CHF (congestive heart failure) ([place])    a. Previously diastolic, then EF [id number] in 10/2016.   CKD (chronic kidney disease), stage III (HCC)    Depression    Diabetes mellitus    Edema of extremities    Erectile dysfunction    GERD (gastroesophageal reflux disease)    Hyperlipidemia    Hypertension    Myocardial infarction ([place]) 2000&2001   Obesity    Onychomycosis    OSA (obstructive sleep apnea)    S/P ICD (internal cardiac defibrillator) procedure and BiV device,  07/25/17 [employer]  07/26/2017   Seizures ([employer])    Sleep apnea    Stroke ([place])    Tubular adenoma of colon 10/2002    Past Surgical History:  Procedure Laterality Date   ANGIOPLASTY  2001   stent x 1   AV NODE ABLATION N/A 08/20/2017   Procedure: AV NODE ABLATION;  Surgeon: Deboraha Sprang, MD;  Location: [place] [facility];  Service: Cardiovascular;  Laterality: N/A;   BIV ICD GENERATOR CHANGEOUT N/A 05/17/2022   Procedure: BIV ICD GENERATOR CHANGEOUT;  Surgeon: Deboraha Sprang, MD;  Location: [place] [facility];  Service: Cardiovascular;  Laterality: N/A;   BIV ICD INSERTION CRT-D N/A 07/25/2017   Procedure: BIV ICD INSERTION CRT-D;  Surgeon: Deboraha Sprang, MD;  Location: [facility];  Service: Cardiovascular;  Laterality: N/A;   COLONOSCOPY  2000?   negative   COLONOSCOPY WITH PROPOFOL N/A 02/08/2019   Procedure: COLONOSCOPY WITH PROPOFOL;  Surgeon: Ladene Artist, MD;  Location: [facility];  Service: Endoscopy;  Laterality: N/A;   FOOT ARTHROTOMY Right    POLYPECTOMY  02/08/2019   Procedure: POLYPECTOMY;  Surgeon: Ladene Artist, MD;  Location: [facility];  Service: Endoscopy;;   TOE AMPUTATION Left 2012    Current Medications: No outpatient medications have been marked as taking for the 08/19/22 encounter (Appointment) with Freada Bergeron, MD.      Allergies:   Enalapril maleate   Social History   Socioeconomic History   Marital status: Married    Spouse name: [name]   Number of children: 2   Years of education: GED   Highest education level: Not on file  Occupational History   Occupation: CUSTOMER SERVICES    Employer: [employer]    Comment: [employer]  Tobacco Use   Smoking status: Former    Packs/day: 0.25    Years: 45.00    Additional pack years: 0.00    Total pack years: 11.25    Types: Cigarettes    Start date: 05/28/1967    Quit date: 10/28/2018    Years since quitting: 3.8    Passive exposure: Past   Smokeless tobacco: Never  Vaping Use   Vaping Use: Never used  Substance and Sexual Activity   Alcohol use: No    Comment: quit in 1993   Drug use: No   Sexual activity: Not on file  Other Topics Concern   Not on file  Social History Narrative   Patient is right handed, consumes caffeine rarely. Patient [name] in home with wife.   Social Determinants of Health   Financial Resource Strain: Medium Risk (10/12/2018)   Overall Financial Resource Strain (CARDIA)    Difficulty of Paying Living Expenses: Somewhat hard  Food Insecurity: Not on file  Transportation Needs: No Transportation Needs (10/12/2018)   PRAPARE - Hydrologist (Medical): No    Lack of Transportation (Non-Medical): No  Physical Activity: Not on file  Stress: Not on file  Social Connections: Not on file     Family History: The patient's family history includes CVA in his mother; Diabetes in his mother; Heart attack in his father and mother. There is no history of Colon cancer or Stomach cancer.  ROS:   Review of Systems  Constitutional:  Negative for chills and fever.  HENT:  Negative for nosebleeds.   Eyes:  Negative for double vision and pain.  Respiratory:  Negative for cough and shortness of breath.   Cardiovascular:  Negative for chest pain, palpitations, orthopnea, claudication, leg swelling and PND.   Gastrointestinal:  Negative for constipation and vomiting.  Genitourinary:  Negative for frequency.  Musculoskeletal:  Positive for back pain. Negative for falls.  Neurological:  Negative for dizziness and loss of consciousness.  Endo/Heme/Allergies:  Negative for polydipsia.  Psychiatric/Behavioral:  Negative for depression. The patient does not have insomnia.      EKGs/Labs/Other Studies Reviewed:    The following studies were reviewed today:  Carotid duplex 01/2021 Right Carotid: The extracranial vessels were near-normal with only minimal wall thickening or plaque.   Left Carotid: The extracranial vessels were near-normal with only minimal  wall thickening or plaque.   Vertebrals:  Bilateral vertebral arteries demonstrate antegrade flow.  Subclavians: Normal flow hemodynamics were seen in bilateral subclavian arteries.    Echo 01/2021  1. Left ventricular ejection fraction, by estimation, is 60 to 65%. The  left ventricle has normal function. The left ventricle has no regional  wall motion abnormalities. There is mild left ventricular hypertrophy.  Left ventricular diastolic parameters are indeterminate.   2. Pacing wires in RA/RV. Right ventricular systolic function is normal.  The right ventricular size is normal.   3. Left atrial size was moderately dilated.   4. The mitral valve is abnormal. Trivial mitral valve regurgitation. No  evidence of mitral stenosis.   5. The aortic valve was not well visualized. There is mild calcification  of the aortic valve. Aortic valve regurgitation is not visualized. Mild  aortic valve sclerosis is present, with no evidence of aortic valve  stenosis.   6. The inferior vena cava is normal in size with greater than 50%  respiratory variability, suggesting right atrial pressure of 3 mmHg.   Lexiscan Myoview 03/17/2020: The left ventricular ejection fraction is mildly decreased (45-54%). There was no ST segment deviation noted during  stress. The study is normal. This is a low risk study. Nuclear stress EF: 53%.   No ischemia or infarction on perfusion images.   EKG:  EKG is personally reviewed. ***  Recent Labs: 10/03/2021: BNP 110.7 05/10/2022: Hemoglobin 14.8; Platelets 136 06/17/2022: BUN 23; Creatinine, Ser 2.10; Potassium 3.9; Sodium 135   Recent Lipid Panel    Component Value Date/Time   CHOL 128 07/27/2021 0955   TRIG 103 07/27/2021 0955   HDL 39 (L) 07/27/2021 0955   CHOLHDL 3.3 07/27/2021 0955   CHOLHDL 3.2 02/28/2016 0906   VLDL 19 02/28/2016 0906   LDLCALC 70 07/27/2021 0955   LDLDIRECT 58 11/06/2011 1434     Risk Assessment/Calculations:           Physical Exam:    VS:  There were no vitals taken for this visit.    Wt Readings from Last 3 Encounters:  06/17/22 210 lb (95.3 kg)  05/17/22 210 lb (95.3 kg)  05/02/22 211 lb 3.2 oz (95.8 kg)     GEN: Well nourished, well developed in no acute distress HEENT: Normal NECK: No JVD; No carotid bruits CARDIAC: RRR, no murmurs, rubs, gallops RESPIRATORY:  Clear to auscultation without rales, wheezing or rhonchi  ABDOMEN: Soft, non-tender, non-distended MUSCULOSKELETAL:  No edema; No deformity  SKIN: Warm and dry NEUROLOGIC:  Alert and oriented x 3 PSYCHIATRIC:  Normal affect   ASSESSMENT:    No diagnosis found.  PLAN:    In order of problems listed above:  #CAD s/p to PCI Lcx: Doing well without anginal symptoms. -Continue crestor 20mg  daily -Continue losartan 25mg  daily -Continue coreg 25mg  BID   #Chronic HFpEF: #Nonischemic CM: #S/p ICD placement LVEF 25-30% in 2018 improved to 55-60% in 2019. Myoview 2007 with no ischemia. He was evaluated for possible cardiac amyloidosis with negative PYP scan. Doing well and euvolemic on exam.  -Continue lasix 40mg  daily -Continue losartan 25mg  daily -Continue coreg 25mg  BID -Continue hydralazine 50mg  TID -Follows with Dr. Caryl Comes for ICD; ERI in 73months   #Permanent Afib s/p AVN  ablation: #Hypercoaguable state: CHADS-vasc 7. Followed by Dr. Caryl Comes. Tolerating AC without issues.  -Continue apixaban 5mg  BID -Continue coreg 25mg  BID   #Severe LVH: PYP scan negative for amyloid. -Continue BP medications   #HTN: Well controlled and at goal <130/90. -Continue losartan 25mg  daily -Continue coreg 25mg  BID -Continue hydralazine 50mg   TID   #HLD: -Continue crestor 20mg  daily -LDL well controlled at 70 on 07/2021   #DMII on Insulin: -Management per PCP -A1C above goal at 9.0 -Working on diet and has been compliant with insulin      Follow-up:   6 months  Medication Adjustments/Labs and Tests Ordered: Current medicines are reviewed at length with the patient today.  Concerns regarding medicines are outlined above.   No orders of the defined types were placed in this encounter.  No orders of the defined types were placed in this encounter.  There are no Patient Instructions on file for this visit.      Signed, Freada Bergeron, MD  08/15/2022 1:15 PM    [facility]

## 2022-08-19 ENCOUNTER — Ambulatory Visit: Payer: PPO | Admitting: Cardiology

## 2022-08-22 ENCOUNTER — Ambulatory Visit: Payer: PPO | Attending: Cardiology | Admitting: Cardiology

## 2022-08-22 ENCOUNTER — Encounter: Payer: Self-pay | Admitting: Cardiology

## 2022-08-22 VITALS — BP 146/90 | HR 70 | Ht 72.0 in | Wt 219.0 lb

## 2022-08-22 DIAGNOSIS — I428 Other cardiomyopathies: Secondary | ICD-10-CM

## 2022-08-22 DIAGNOSIS — Z95810 Presence of automatic (implantable) cardiac defibrillator: Secondary | ICD-10-CM | POA: Diagnosis not present

## 2022-08-22 DIAGNOSIS — I639 Cerebral infarction, unspecified: Secondary | ICD-10-CM | POA: Diagnosis not present

## 2022-08-22 DIAGNOSIS — R931 Abnormal findings on diagnostic imaging of heart and coronary circulation: Secondary | ICD-10-CM | POA: Diagnosis not present

## 2022-08-22 DIAGNOSIS — I10 Essential (primary) hypertension: Secondary | ICD-10-CM

## 2022-08-22 DIAGNOSIS — R0609 Other forms of dyspnea: Secondary | ICD-10-CM

## 2022-08-22 DIAGNOSIS — I5022 Chronic systolic (congestive) heart failure: Secondary | ICD-10-CM

## 2022-08-22 DIAGNOSIS — E782 Mixed hyperlipidemia: Secondary | ICD-10-CM

## 2022-08-22 DIAGNOSIS — I2510 Atherosclerotic heart disease of native coronary artery without angina pectoris: Secondary | ICD-10-CM

## 2022-08-22 DIAGNOSIS — I5032 Chronic diastolic (congestive) heart failure: Secondary | ICD-10-CM | POA: Diagnosis not present

## 2022-08-22 DIAGNOSIS — I4821 Permanent atrial fibrillation: Secondary | ICD-10-CM | POA: Diagnosis not present

## 2022-08-22 MED ORDER — LOSARTAN POTASSIUM 25 MG PO TABS: 25.0000 mg | ORAL_TABLET | Freq: Every day | ORAL | 3 refills | Status: DC

## 2022-08-22 MED ORDER — CARVEDILOL 25 MG PO TABS: ORAL_TABLET | ORAL | 3 refills | Status: DC

## 2022-08-22 MED ORDER — FUROSEMIDE 40 MG PO TABS: 40.0000 mg | ORAL_TABLET | Freq: Every day | ORAL | 3 refills | Status: DC

## 2022-08-22 MED ORDER — ROSUVASTATIN CALCIUM 20 MG PO TABS: 20.0000 mg | ORAL_TABLET | Freq: Every day | ORAL | 3 refills | Status: DC

## 2022-08-22 MED ORDER — HYDRALAZINE HCL 50 MG PO TABS: 50.0000 mg | ORAL_TABLET | Freq: Three times a day (TID) | ORAL | 3 refills | Status: DC

## 2022-08-22 NOTE — Progress Notes (Signed)
Cardiology Office Note:    Date:  08/22/2022   ID:  Anthony [name], Anthony [name] 1950/06/01, MRN [id number]  PCP:  Lenoria Chime, MD   [facility] Providers Cardiologist:  Freada Bergeron, MD Electrophysiologist:  Virl Axe, MD  { Referring MD: Zenia Resides, MD   History of Present Illness:    Anthony [name] is a 72 y.o. male with a hx of atrial fibrillation, AV ablation 07/2017 with PM/ICD placement, CAD s/p PCI in 2013 of circumflex lesion extending into second obtuse marginal branch with DES, hypertension, CKD 4, chronic systolic and diastolic heart failure, OSA who was previously followed by Dr. Meda Coffee who presents to clinic for follow-up.  Previous cardiac catheterization in 2013 with DES to circumflex extending into second obtuse marginal branch. Myoview 2007 with no ischemia.  Echocardiogram 2018 LVEF 25 to 30% improved in 2019 to 55-60%.  Previous testing for amyloid with negative PYP.  Seen by Dr. Meda Coffee 06/2020 with no edema, orthopnea, PND.  No changes were made at that time.  Hospitalized 01/2021 with leg weakness which normalized and neurology was consulted.     Saw Dr. Caryl Comes 03/26/2021 noted marked dyspnea on exertion.  He was compliant with CPAP.  His same regimen was continued.  He was seen 04/16/2021. Noted to have elevated OptiVol level that morning by ICD check.  He noted dyspnea and lower extremity edema but no orthopnea nor PND.  He was recommended to start Lasix 40 mg daily as well as potassium.  Had ICD generator change 04/2023 where he did well.  He was last seen 07/04/22 for preop evaluation for epidural steroid injection. Was doing well at that time.  Today, the patient states that he feels well today. Has been having cramps in his hands usually in the afternoon which has been bothersome. Continues to have dyspnea on exertion but this is stable without progressing. No chest pain, orthopnea, PND, palpitations or LE edema. Tolerating medications as  prescribed. Blood pressure mainly 125-138 at home. No bleeding issues on the apixaban.    Past Medical History:  Diagnosis Date   Arthritis    Benign neoplasm of descending colon    Benign neoplasm of sigmoid colon    Benign neoplasm of transverse colon    CAD in native artery    a. reported MI 2000, 2001, s/p stenting of the circumflex lesion extending into the second obtuse marginal branch with a drug-eluting stent in 2003. b. low risk nuc 2015.   Chronic atrial fibrillation (HCC)    Chronic combined systolic and diastolic CHF (congestive heart failure) ([place])    a. Previously diastolic, then EF [id number] in 10/2016.   CKD (chronic kidney disease), stage III (HCC)    Depression    Diabetes mellitus    Edema of extremities    Erectile dysfunction    GERD (gastroesophageal reflux disease)    Hyperlipidemia    Hypertension    Myocardial infarction ([place]) 2000&2001   Obesity    Onychomycosis    OSA (obstructive sleep apnea)    S/P ICD (internal cardiac defibrillator) procedure and BiV device,  07/25/17 Medtronic  07/26/2017   Seizures ([place])    Sleep apnea    Stroke ([place])    Tubular adenoma of colon 10/2002    Past Surgical History:  Procedure Laterality Date   ANGIOPLASTY  2001   stent x 1   AV NODE ABLATION N/A 08/20/2017   Procedure: AV NODE ABLATION;  Surgeon: Deboraha Sprang, MD;  Location: [place] [facility];  Service: Cardiovascular;  Laterality: N/A;   BIV ICD GENERATOR CHANGEOUT N/A 05/17/2022   Procedure: BIV ICD GENERATOR CHANGEOUT;  Surgeon: Deboraha Sprang, MD;  Location: [place] [facility];  Service: Cardiovascular;  Laterality: N/A;   BIV ICD INSERTION CRT-D N/A 07/25/2017   Procedure: BIV ICD INSERTION CRT-D;  Surgeon: Deboraha Sprang, MD;  Location: [place] [facility];  Service: Cardiovascular;  Laterality: N/A;   COLONOSCOPY  2000?   negative   COLONOSCOPY WITH PROPOFOL N/A 02/08/2019   Procedure: COLONOSCOPY WITH PROPOFOL;  Surgeon: Ladene Artist, MD;  Location: [facility];  Service: Endoscopy;  Laterality: N/A;   FOOT ARTHROTOMY Right    POLYPECTOMY  02/08/2019   Procedure: POLYPECTOMY;  Surgeon: Ladene Artist, MD;  Location: [facility];  Service: Endoscopy;;   TOE AMPUTATION Left 2012    Current Medications: Current Meds  Medication Sig   acetaminophen (TYLENOL) 650 MG CR tablet Take 1,300 mg by mouth 2 (two) times daily as needed for pain.   apixaban (ELIQUIS) 5 MG TABS tablet Take 1 tablet (5 mg total) by mouth 2 (two) times daily.   Continuous Blood Gluc Receiver (FREESTYLE LIBRE 14 DAY READER) DEVI Apply topically as directed.   Continuous Blood Gluc Sensor (FREESTYLE LIBRE 14 DAY SENSOR) MISC 1 Device by Does not apply route every 14 (fourteen) days.   diclofenac Sodium (VOLTAREN) 1 % GEL Apply 4 g topically 4 (four) times daily.   enoxaparin (LOVENOX) 150 MG/ML injection Inject the contents of one syringe under the skin in the morning on 2/11, 2/12, and 2/13   gabapentin (NEURONTIN) 300 MG capsule Take 1 capsule (300 mg total) by mouth at bedtime.   insulin aspart (NOVOLOG FLEXPEN) 100 UNIT/ML FlexPen INJECT 5-8 UNITS INTO THE SKIN 2 (TWO) TIMES DAILY BEFORE LUNCH AND SUPPER. INJECT 5 UNITS BEFORE LUNCH AND 5-8 UNITS BEFORE DINNER.   insulin degludec (TRESIBA FLEXTOUCH) 200 UNIT/ML FlexTouch Pen Inject 22 Units into the skin daily.   Insulin Pen Needle (PEN NEEDLES) 32G X 4 MM MISC 1 Device by Does not apply route as directed.   Lancets (ONETOUCH ULTRASOFT) lancets Use as instructed   loratadine (CLARITIN) 10 MG tablet Take 10 mg by mouth daily as needed for allergies.   Multiple Vitamin (MULTIVITAMIN) tablet Take 1 tablet by mouth daily.     nitroGLYCERIN (NITROSTAT) 0.4 MG SL tablet Place 1 tablet (0.4 mg total) under the tongue every 5 (five) minutes as needed for chest pain. If no relief by 3rd tab, call 911   omeprazole (PRILOSEC) 40 MG capsule TAKE ONE CAPSULE BY MOUTH DAILY BEFORE SUPPER   oxymorphone (OPANA) 5 MG tablet Take 10-15  mg by mouth in the morning and at bedtime. Per Dr Brien Few   PARoxetine (PAXIL) 30 MG tablet TAKE 1 TABLET BY MOUTH EVERY DAY   potassium chloride SA (KLOR-CON M20) 20 MEQ tablet Take 1 tablet (20 mEq total) by mouth daily.   tamsulosin (FLOMAX) 0.4 MG CAPS capsule TAKE 1 CAPSULE BY MOUTH EVERYDAY AT BEDTIME   [DISCONTINUED] carvedilol (COREG) 25 MG tablet TAKE 1 TABLET 2 TIMES DAILY.   [DISCONTINUED] furosemide (LASIX) 40 MG tablet Take 1 tablet (40 mg total) by mouth daily.   [DISCONTINUED] hydrALAZINE (APRESOLINE) 50 MG tablet Take 1 tablet (50 mg total) by mouth 3 (three) times daily.   [DISCONTINUED] losartan (COZAAR) 25 MG tablet Take 1 tablet (25 mg total) by mouth daily.   [DISCONTINUED] rosuvastatin (CRESTOR)  20 MG tablet Take 1 tablet (20 mg total) by mouth daily.     Allergies:   Enalapril maleate   Social History   Socioeconomic History   Marital status: Married    Spouse name: [name]   Number of children: 2   Years of education: GED   Highest education level: Not on file  Occupational History   Occupation: CUSTOMER SERVICES    Employer: [employer]    Comment: [employer]  Tobacco Use   Smoking status: Former    Packs/day: 0.25    Years: 45.00    Additional pack years: 0.00    Total pack years: 11.25    Types: Cigarettes    Start date: 05/28/1967    Quit date: 10/28/2018    Years since quitting: 3.8    Passive exposure: Past   Smokeless tobacco: Never  Vaping Use   Vaping Use: Never used  Substance and Sexual Activity   Alcohol use: No    Comment: quit in 1993   Drug use: No   Sexual activity: Not on file  Other Topics Concern   Not on file  Social History Narrative   Patient is right handed, consumes caffeine rarely. Patient resides in home with wife.   Social Determinants of Health   Financial Resource Strain: Medium Risk (10/12/2018)   Overall Financial Resource Strain (CARDIA)    Difficulty of Paying Living Expenses: Somewhat hard  Food Insecurity: Not on file   Transportation Needs: No Transportation Needs (10/12/2018)   PRAPARE - Hydrologist (Medical): No    Lack of Transportation (Non-Medical): No  Physical Activity: Not on file  Stress: Not on file  Social Connections: Not on file     Family History: The patient's family history includes CVA in his mother; Diabetes in his mother; Heart attack in his father and mother. There is no history of Colon cancer or Stomach cancer.  ROS:   Review of Systems  Constitutional:  Negative for chills and fever.  HENT:  Negative for nosebleeds.   Eyes:  Negative for double vision and pain.  Respiratory:  Negative for cough and shortness of breath.   Cardiovascular:  Negative for chest pain, palpitations, orthopnea, claudication, leg swelling and PND.  Genitourinary:  Negative for frequency.  Musculoskeletal:  Positive for back pain and myalgias. Negative for falls.  Neurological:  Negative for dizziness and loss of consciousness.  Psychiatric/Behavioral:  Negative for depression.      EKGs/Labs/Other Studies Reviewed:    The following studies were reviewed today:  Carotid duplex 01/2021 Right Carotid: The extracranial vessels were near-normal with only minimal wall thickening or plaque.   Left Carotid: The extracranial vessels were near-normal with only minimal  wall thickening or plaque.   Vertebrals:  Bilateral vertebral arteries demonstrate antegrade flow.  Subclavians: Normal flow hemodynamics were seen in bilateral subclavian arteries.    Echo 01/2021  1. Left ventricular ejection fraction, by estimation, is 60 to 65%. The  left ventricle has normal function. The left ventricle has no regional  wall motion abnormalities. There is mild left ventricular hypertrophy.  Left ventricular diastolic parameters are indeterminate.   2. Pacing wires in RA/RV. Right ventricular systolic function is normal.  The right ventricular size is normal.   3. Left atrial size was  moderately dilated.   4. The mitral valve is abnormal. Trivial mitral valve regurgitation. No  evidence of mitral stenosis.   5. The aortic valve was not well visualized. There is  mild calcification  of the aortic valve. Aortic valve regurgitation is not visualized. Mild  aortic valve sclerosis is present, with no evidence of aortic valve  stenosis.   6. The inferior vena cava is normal in size with greater than 50%  respiratory variability, suggesting right atrial pressure of 3 mmHg.   Lexiscan Myoview 03/17/2020: The left ventricular ejection fraction is mildly decreased (45-54%). There was no ST segment deviation noted during stress. The study is normal. This is a low risk study. Nuclear stress EF: 53%.   No ischemia or infarction on perfusion images.   EKG:  No new tracing today.  Recent Labs: 10/03/2021: BNP 110.7 05/10/2022: Hemoglobin 14.8; Platelets 136 06/17/2022: BUN 23; Creatinine, Ser 2.10; Potassium 3.9; Sodium 135   Recent Lipid Panel    Component Value Date/Time   CHOL 128 07/27/2021 0955   TRIG 103 07/27/2021 0955   HDL 39 (L) 07/27/2021 0955   CHOLHDL 3.3 07/27/2021 0955   CHOLHDL 3.2 02/28/2016 0906   VLDL 19 02/28/2016 0906   LDLCALC 70 07/27/2021 0955   LDLDIRECT 58 11/06/2011 1434     Risk Assessment/Calculations:           Physical Exam:    VS:  BP (!) 146/90   Pulse 70   Ht 6' (1.829 m)   Wt 219 lb (99.3 kg)   SpO2 98%   BMI 29.70 kg/m     Wt Readings from Last 3 Encounters:  08/22/22 219 lb (99.3 kg)  06/17/22 210 lb (95.3 kg)  05/17/22 210 lb (95.3 kg)     GEN: Comfortable, NAD, ambulates with a cane HEENT: Normal NECK: No JVD; No carotid bruits CARDIAC: RRR, no murmurs, rubs, gallops RESPIRATORY:  Clear to auscultation without rales, wheezing or rhonchi  ABDOMEN: Soft, non-tender, non-distended MUSCULOSKELETAL: Trace to 1+ LE edema on the left (chronic), trace on the right SKIN: Warm and dry NEUROLOGIC:  Alert and oriented x  3 PSYCHIATRIC:  Normal affect   ASSESSMENT:    1. NICM (nonischemic cardiomyopathy) ([place])   2. Atrial fibrillation, permanent ([place])   3. Congestive heart failure, NYHA class 3, chronic, systolic (HCC)   4. Cerebrovascular accident (CVA), unspecified mechanism ([place])   5. ICD (implantable cardioverter-defibrillator), biventricular, in situ   6. DOE (dyspnea on exertion)   7. Coronary artery disease involving native coronary artery of native heart without angina pectoris   8. HYPERTENSION, BENIGN SYSTEMIC   9. Chronic diastolic heart failure ([place])   10. S/P ICD (internal cardiac defibrillator) procedure and BiV device,  07/25/17 Medtronic    11. Decreased cardiac ejection fraction   12. Mixed hyperlipidemia     PLAN:    In order of problems listed above:  #CAD s/p to PCI Lcx: Doing well without anginal symptoms. -Continue crestor 20mg  daily -Continue losartan 25mg  daily -Continue coreg 25mg  BID   #Chronic HFpEF: #Nonischemic CM: #S/p ICD placement LVEF 25-30% in 2018 improved to 55-60% in 2019 and 60-65% in 01/2021. Myoview 2007 with no ischemia. He was evaluated for possible cardiac amyloidosis with negative PYP scan. Doing well and euvolemic on exam.  -Continue lasix 40mg  daily -Continue losartan 25mg  daily -Continue coreg 25mg  BID -Continue hydralazine 50mg  TID -Follows with Dr. Caryl Comes for ICD; battery changed in 04/2023   #Permanent Afib s/p AVN ablation: #Hypercoaguable state: CHADS-vasc 7. Followed by Dr. Caryl Comes. Tolerating AC without issues.  -Continue apixaban 5mg  BID -Continue coreg 25mg  BID   #Severe LVH: PYP scan negative for amyloid. -Continue BP medications as  below   #HTN: Well controlled and at goal <130/90. -Continue losartan 25mg  daily -Continue coreg 25mg  BID -Continue hydralazine 50mg  TID   #HLD: -Continue crestor 20mg  daily -Repeat cholesterol for monitoring   #DMII on Insulin: -Management per PCP -A1C above goal at 10.7 in 03/2022; followed  by Dr. Thompson Grayer -Compliant with insulin      Follow-up:   6 months  Medication Adjustments/Labs and Tests Ordered: Current medicines are reviewed at length with the patient today.  Concerns regarding medicines are outlined above.   Orders Placed This Encounter  Procedures   Basic metabolic panel   Magnesium   HgB A1c   Lipid Profile   Meds ordered this encounter  Medications   rosuvastatin (CRESTOR) 20 MG tablet    Sig: Take 1 tablet (20 mg total) by mouth daily.    Dispense:  90 tablet    Refill:  3   losartan (COZAAR) 25 MG tablet    Sig: Take 1 tablet (25 mg total) by mouth daily.    Dispense:  90 tablet    Refill:  3   furosemide (LASIX) 40 MG tablet    Sig: Take 1 tablet (40 mg total) by mouth daily.    Dispense:  90 tablet    Refill:  3   carvedilol (COREG) 25 MG tablet    Sig: TAKE 1 TABLET 2 TIMES DAILY.    Dispense:  180 tablet    Refill:  3    DX Code I48.21 and I42.8   hydrALAZINE (APRESOLINE) 50 MG tablet    Sig: Take 1 tablet (50 mg total) by mouth 3 (three) times daily.    Dispense:  270 tablet    Refill:  3   Patient Instructions  Medication Instructions:   Your physician recommends that you continue on your current medications as directed. Please refer to the Current Medication list given to you today.  *If you need a refill on your cardiac medications before your next appointment, please call your pharmacy*   Lab Work:  TODAY--BMET, MAGNESIUM LEVEL, A1C, AND LIPIDS  If you have labs (blood work) drawn today and your tests are completely normal, you will receive your results only by: [place] (if you have MyChart) OR A paper copy in the mail If you have any lab test that is abnormal or we need to change your treatment, we will call you to review the results.    Follow-Up: At [facility], you and your health needs are our priority.  As part of our continuing mission to provide you with exceptional heart care, we have created  designated Provider Care Teams.  These Care Teams include your primary Cardiologist (physician) and Advanced Practice Providers (APPs -  Physician Assistants and Nurse Practitioners) who all work together to provide you with the care you need, when you need it.  We recommend signing up for the patient portal called "MyChart".  Sign up information is provided on this After Visit Summary.  MyChart is used to connect with patients for Virtual Visits (Telemedicine).  Patients are able to view lab/test results, encounter notes, upcoming appointments, etc.  Non-urgent messages can be sent to your provider as well.   To learn more about what you can do with MyChart, go to [email or web address].    Your next appointment:   6 -8 month(s)  Provider:   Freada Bergeron, MD           Signed, Freada Bergeron, MD  08/22/2022 10:15 AM  [facility]

## 2022-08-22 NOTE — Patient Instructions (Signed)
Medication Instructions:   Your physician recommends that you continue on your current medications as directed. Please refer to the Current Medication list given to you today.  *If you need a refill on your cardiac medications before your next appointment, please call your pharmacy*   Lab Work:  TODAY--BMET, MAGNESIUM LEVEL, A1C, AND LIPIDS  If you have labs (blood work) drawn today and your tests are completely normal, you will receive your results only by: [place] (if you have MyChart) OR A paper copy in the mail If you have any lab test that is abnormal or we need to change your treatment, we will call you to review the results.    Follow-Up: At [facility], you and your health needs are our priority.  As part of our continuing mission to provide you with exceptional heart care, we have created designated Provider Care Teams.  These Care Teams include your primary Cardiologist (physician) and Advanced Practice Providers (APPs -  Physician Assistants and Nurse Practitioners) who all work together to provide you with the care you need, when you need it.  We recommend signing up for the patient portal called "MyChart".  Sign up information is provided on this After Visit Summary.  MyChart is used to connect with patients for Virtual Visits (Telemedicine).  Patients are able to view lab/test results, encounter notes, upcoming appointments, etc.  Non-urgent messages can be sent to your provider as well.   To learn more about what you can do with MyChart, go to [email or web address].    Your next appointment:   6 -8 month(s)  Provider:   Freada Bergeron, MD

## 2022-08-23 LAB — BASIC METABOLIC PANEL
BUN/Creatinine Ratio: 9 — ABNORMAL LOW (ref 10–24)
BUN: 17 mg/dL (ref 8–27)
CO2: 22 mmol/L (ref 20–29)
Calcium: 8.8 mg/dL (ref 8.6–10.2)
Chloride: 106 mmol/L (ref 96–106)
Creatinine, Ser: 1.95 mg/dL — ABNORMAL HIGH (ref 0.76–1.27)
Glucose: 296 mg/dL — ABNORMAL HIGH (ref 70–99)
Potassium: 3.9 mmol/L (ref 3.5–5.2)
Sodium: 144 mmol/L (ref 134–144)
eGFR: 36 mL/min/{1.73_m2} — ABNORMAL LOW (ref >59)

## 2022-08-23 LAB — LIPID PANEL
Chol/HDL Ratio: 2.5 ratio (ref 0.0–5.0)
Cholesterol, Total: 146 mg/dL (ref 100–199)
HDL: 59 mg/dL (ref >39)
LDL Chol Calc (NIH): 70 mg/dL (ref 0–99)
Triglycerides: 91 mg/dL (ref 0–149)
VLDL Cholesterol Cal: 17 mg/dL (ref 5–40)

## 2022-08-23 LAB — HEMOGLOBIN A1C
Est. average glucose Bld gHb Est-mCnc: 246 mg/dL
Hgb A1c MFr Bld: 10.2 % — ABNORMAL HIGH (ref 4.8–5.6)

## 2022-08-23 LAB — MAGNESIUM: Magnesium: 1.8 mg/dL (ref 1.6–2.3)

## 2022-08-26 ENCOUNTER — Ambulatory Visit: Payer: PPO | Admitting: Internal Medicine

## 2022-08-26 DIAGNOSIS — I4821 Permanent atrial fibrillation: Secondary | ICD-10-CM

## 2022-08-26 DIAGNOSIS — I5022 Chronic systolic (congestive) heart failure: Secondary | ICD-10-CM

## 2022-08-26 DIAGNOSIS — Z95810 Presence of automatic (implantable) cardiac defibrillator: Secondary | ICD-10-CM

## 2022-08-29 ENCOUNTER — Ambulatory Visit (INDEPENDENT_AMBULATORY_CARE_PROVIDER_SITE_OTHER): Payer: PPO

## 2022-08-29 DIAGNOSIS — I428 Other cardiomyopathies: Secondary | ICD-10-CM | POA: Diagnosis not present

## 2022-08-29 LAB — CUP PACEART REMOTE DEVICE CHECK
Battery Remaining Longevity: 78 mo
Battery Remaining Percentage: 92 %
Brady Statistic RA Percent Paced: 0 %
Brady Statistic RV Percent Paced: 98 %
Date Time Interrogation Session: 20240404143400
HighPow Impedance: 56 Ohm
Implantable Lead Connection Status: 753985
Implantable Lead Connection Status: 753985
Implantable Lead Implant Date: 20190301
Implantable Lead Implant Date: 20190301
Implantable Lead Location: 753858
Implantable Lead Location: 753860
Implantable Pulse Generator Implant Date: 20231222
Lead Channel Impedance Value: 517 Ohm
Lead Channel Impedance Value: 707 Ohm
Lead Channel Pacing Threshold Amplitude: 0.6 V
Lead Channel Pacing Threshold Pulse Width: 0.4 ms
Lead Channel Setting Pacing Amplitude: 2 V
Lead Channel Setting Pacing Amplitude: 4 V
Lead Channel Setting Pacing Pulse Width: 0.4 ms
Lead Channel Setting Pacing Pulse Width: 1.5 ms
Lead Channel Setting Sensing Sensitivity: 0.6 mV
Lead Channel Setting Sensing Sensitivity: 1 mV
Pulse Gen Serial Number: 285896

## 2022-08-30 ENCOUNTER — Other Ambulatory Visit: Payer: Self-pay

## 2022-08-30 DIAGNOSIS — N184 Chronic kidney disease, stage 4 (severe): Secondary | ICD-10-CM

## 2022-08-30 MED ORDER — TAMSULOSIN HCL 0.4 MG PO CAPS: ORAL_CAPSULE | ORAL | 3 refills | Status: DC

## 2022-09-04 NOTE — Progress Notes (Addendum)
    SUBJECTIVE:   CHIEF COMPLAINT / HPI:   T2DM- had A1c 10.2 about two weeks ago at cardiology office. Currently taking 24 U Treseiba, 16-20 U Novolog before lunch and dinner. Due for urine albumin/Cr ratio. On ARB. Prednisone injection at the end of January. Recent eye exam 09/03/22  HTN- elevated on repeat, he notes it is often elevated at home. Takes all of his medications and does not miss doses. No headaches, no orthostasis, no falls.   Systolic and diastolic HF- denies chest pain, shortness of breath, orthopnea. Has b/l leg swelling which he notes is present.   PERTINENT  PMH / PSH: Afib, CAD s/p PCI, HTN, CKD 3b, T2DM, systolic and diastolic HF, OSA  OBJECTIVE:   BP (!) 156/92   Pulse 76   Ht 6' (1.829 m)   Wt 220 lb 3.2 oz (99.9 kg)   SpO2 96%   BMI 29.86 kg/m   General: A&O, NAD HEENT: No sign of trauma, EOM grossly intact Cardiac: RRR, no m/r/g Respiratory: CTAB, normal WOB, no w/c/r GI: Soft, NTTP, non-distended  Extremities: NTTP, 2+ edema bilaterally to mid shin Neuro: Normal gait, moves all four extremities appropriately. Psych: Appropriate mood and affect   ASSESSMENT/PLAN:   Insulin dependent type 2 diabetes mellitus (HCC) Reviewed BG on Libre reader, fasting anywhere from 180s-300s Increase Treiseba to 28 U daily, and will call in one week with fasting BG, likely will be able to increase more Continue mealtime insulin and will not increase as he sometimes has lows (but rarely) after his dinner dose Eye exam UTD, urine albumin Cr ratio today- on ARB, but could add SGLT2 pending better BG control to reduce risk of dehydration and AKI F/u in 1 month with Dr Raymondo Band, will check in weekly for insulin titration with myself.  HYPERTENSION, BENIGN SYSTEMIC Not at goal, will increase Coreg to 50mg  BID and he will check BP at home and discuss in one week  Congestive heart failure, NYHA class 3, chronic, systolic (HCC) Following with cardiology, asymptomatic, continue  home medications Increased Coreg as above     Billey Co, MD [facility]

## 2022-09-06 ENCOUNTER — Ambulatory Visit (INDEPENDENT_AMBULATORY_CARE_PROVIDER_SITE_OTHER): Payer: PPO | Admitting: Family Medicine

## 2022-09-06 ENCOUNTER — Encounter: Payer: Self-pay | Admitting: Family Medicine

## 2022-09-06 VITALS — BP 156/92 | HR 76 | Ht 72.0 in | Wt 220.2 lb

## 2022-09-06 DIAGNOSIS — E119 Type 2 diabetes mellitus without complications: Secondary | ICD-10-CM | POA: Diagnosis not present

## 2022-09-06 DIAGNOSIS — Z794 Long term (current) use of insulin: Secondary | ICD-10-CM | POA: Diagnosis not present

## 2022-09-06 DIAGNOSIS — I10 Essential (primary) hypertension: Secondary | ICD-10-CM | POA: Diagnosis not present

## 2022-09-06 DIAGNOSIS — I5022 Chronic systolic (congestive) heart failure: Secondary | ICD-10-CM

## 2022-09-06 NOTE — Assessment & Plan Note (Addendum)
Reviewed BG on Libre reader, fasting anywhere from 180s-300s Increase Treiseba to 28 U daily, and will call in one week with fasting BG, likely will be able to increase more Continue mealtime insulin and will not increase as he sometimes has lows (but rarely) after his dinner dose Eye exam UTD, urine albumin Cr ratio today- on ARB, but could add SGLT2 pending better BG control to reduce risk of dehydration and AKI F/u in 1 month with Dr Raymondo Band, will check in weekly for insulin titration with myself.

## 2022-09-06 NOTE — Assessment & Plan Note (Signed)
Not at goal, will increase Coreg to 50mg  BID and he will check BP at home and discuss in one week

## 2022-09-06 NOTE — Patient Instructions (Signed)
It was wonderful to see you today.  Please bring ALL of your medications with you to every visit.   Today we talked about:  We will increase your carvedilol (Coreg) to 2 tablets - 50 mg total, twice a day.  Lets increase your [place] to 28 units daily, and call me in a week with your fasting blood sugar values.  Please schedule a follow up in one month with Dr Raymondo Band to check on diabetes and blood pressure.  Thank you for choosing [facility].   Please call [phone] with any questions about today's appointment.  Please arrive at least 15 minutes prior to your scheduled appointments.   If you had blood work today, I will send you a MyChart message or a letter if results are normal. Otherwise, I will give you a call.   If you had a referral placed, they will call you to set up an appointment. Please give [place] a call if you don't hear back in the next 2 weeks.   If you need additional refills before your next appointment, please call your pharmacy first.   Burley Saver, MD  Family Medicine

## 2022-09-06 NOTE — Assessment & Plan Note (Signed)
Following with cardiology, asymptomatic, continue home medications Increased Coreg as above

## 2022-09-08 LAB — MICROALBUMIN / CREATININE URINE RATIO
Creatinine, Urine: 28.3 mg/dL
Microalb/Creat Ratio: 2084 mg/g creat — ABNORMAL HIGH (ref 0–29)
Microalbumin, Urine: 589.7 ug/mL

## 2022-09-11 LAB — HM DIABETES EYE EXAM

## 2022-09-12 ENCOUNTER — Telehealth: Payer: Self-pay

## 2022-09-12 ENCOUNTER — Other Ambulatory Visit: Payer: Self-pay | Admitting: Family Medicine

## 2022-09-12 ENCOUNTER — Telehealth: Payer: Self-pay | Admitting: Family Medicine

## 2022-09-12 DIAGNOSIS — G8929 Other chronic pain: Secondary | ICD-10-CM

## 2022-09-12 MED ORDER — GABAPENTIN 300 MG PO CAPS: 300.0000 mg | ORAL_CAPSULE | Freq: Every day | ORAL | 3 refills | Status: DC

## 2022-09-12 NOTE — Telephone Encounter (Signed)
Called patient to check his BG. Anthony [name] increased to 28 units and he has been taking this every morning. Fasting BG 157 today, 187 yesterday AM.  No hypoglycemia symptoms or dizziness, but notes some hand cramping. Discussed these values seem improved, due to hand cramping symptoms will hold on increasing basal further at this time and will plan to check [facility] reader with Dr Raymondo Band at f/u appt.   Has f/u appt with Dr Raymondo Band on 10/04/22. Gabapentin refilled provided per patient request, answered all questions and concerns.  Burley Saver MD

## 2022-09-12 NOTE — Progress Notes (Signed)
Gabapentin refilled per patient request

## 2022-09-12 NOTE — Telephone Encounter (Signed)
Received fax from [facility] asking for an alternative medication for Gabapentin 300 MG Caps. Please send Rx as appropriate.  Sunday Spillers, CMA

## 2022-09-13 NOTE — Telephone Encounter (Signed)
Great, thanks, let me know if anything else is needed. Latrelle Dodrill, MD

## 2022-09-13 NOTE — Telephone Encounter (Signed)
Covering for Dr. Miquel Dunn.  Can you clarify what the problem is? Are they out of  capsules?  Thanks Latrelle Dodrill, MD

## 2022-09-16 DIAGNOSIS — I110 Hypertensive heart disease with heart failure: Secondary | ICD-10-CM | POA: Diagnosis not present

## 2022-09-16 DIAGNOSIS — Z89422 Acquired absence of other left toe(s): Secondary | ICD-10-CM | POA: Diagnosis not present

## 2022-09-16 DIAGNOSIS — I5040 Unspecified combined systolic (congestive) and diastolic (congestive) heart failure: Secondary | ICD-10-CM | POA: Diagnosis not present

## 2022-09-16 DIAGNOSIS — Z794 Long term (current) use of insulin: Secondary | ICD-10-CM | POA: Diagnosis not present

## 2022-09-16 DIAGNOSIS — Z7985 Long-term (current) use of injectable non-insulin antidiabetic drugs: Secondary | ICD-10-CM | POA: Diagnosis not present

## 2022-09-16 DIAGNOSIS — E1122 Type 2 diabetes mellitus with diabetic chronic kidney disease: Secondary | ICD-10-CM | POA: Diagnosis not present

## 2022-09-16 DIAGNOSIS — N189 Chronic kidney disease, unspecified: Secondary | ICD-10-CM | POA: Diagnosis not present

## 2022-09-26 ENCOUNTER — Telehealth: Payer: Self-pay

## 2022-09-26 NOTE — Telephone Encounter (Signed)
   Pre-operative Risk Assessment    Patient Name: Anthony [name]  DOB: 1951/05/26 MRN: [id number]      Request for Surgical Clearance    Procedure:   Lumbar  Epidural Steroid Injection  Date of Surgery:  Clearance TBD                                 Surgeon:  Not listed Surgeon's Group or Practice Name:  [facility] [place] Phone number:  [phone] [facility] [place] Fax number:  [phone]   Type of Clearance Requested:   - Medical  - Pharmacy:  Hold Apixaban (Eliquis) Pt to hold 3 days prior to procedure and resume 24 hours after procedure   Type of Anesthesia:  None    Additional requests/questions:    Signed, Zada Finders   09/26/2022, 2:01 PM

## 2022-09-27 NOTE — Telephone Encounter (Signed)
Pt does have appt with Dr. Graciela Husbands 10/04/22. I will confer with pre op APP to see if clearance ok to be assessed at appt 10/04/22 with MD.

## 2022-09-27 NOTE — Telephone Encounter (Signed)
Ok per pre op APP clearance to be assessed at appt 10/04/22. I will update all parties involved.

## 2022-09-27 NOTE — Telephone Encounter (Signed)
Patient with diagnosis of afib on Eliquis for anticoagulation.    Procedure: lumbar ESI Date of procedure: TBD  CHA2DS2-VASc Score = 7  This indicates a 11.2% annual risk of stroke. The patient's score is based upon: CHF History: 1 HTN History: 1 Diabetes History: 1 Stroke History: 2 Vascular Disease History: 1 Age Score: 1 Gender Score: 0   CrCl 20mL/min Platelet count 136K  Per office protocol, patient can hold Eliquis for 3 days prior to procedure. He will require bridging with Lovenox while off of Eliquis per Dr Shari Prows (cleared for previous ESI in January 2024 with rec for bridge). Pt should call clinic once ESI is scheduled so that we can provide Lovenox bridge instructions. Should be able to use same bridging instructions provided to pt in 07/02/22 phone note with updated dates.  **This guidance is not considered finalized until pre-operative APP has relayed final recommendations.**

## 2022-10-02 NOTE — Progress Notes (Signed)
Remote ICD transmission.   

## 2022-10-04 ENCOUNTER — Ambulatory Visit: Payer: PPO | Attending: Internal Medicine | Admitting: Internal Medicine

## 2022-10-04 ENCOUNTER — Encounter: Payer: Self-pay | Admitting: Pharmacist

## 2022-10-04 ENCOUNTER — Encounter: Payer: Self-pay | Admitting: Internal Medicine

## 2022-10-04 ENCOUNTER — Ambulatory Visit (INDEPENDENT_AMBULATORY_CARE_PROVIDER_SITE_OTHER): Payer: PPO | Admitting: Pharmacist

## 2022-10-04 ENCOUNTER — Telehealth: Payer: Self-pay | Admitting: Pharmacist

## 2022-10-04 VITALS — BP 140/84 | HR 70 | Ht 71.0 in | Wt 213.0 lb

## 2022-10-04 VITALS — BP 112/56 | HR 70 | Ht 71.0 in | Wt 214.6 lb

## 2022-10-04 DIAGNOSIS — Z794 Long term (current) use of insulin: Secondary | ICD-10-CM

## 2022-10-04 DIAGNOSIS — Z95810 Presence of automatic (implantable) cardiac defibrillator: Secondary | ICD-10-CM

## 2022-10-04 DIAGNOSIS — I4821 Permanent atrial fibrillation: Secondary | ICD-10-CM | POA: Diagnosis not present

## 2022-10-04 DIAGNOSIS — G8929 Other chronic pain: Secondary | ICD-10-CM

## 2022-10-04 DIAGNOSIS — M5441 Lumbago with sciatica, right side: Secondary | ICD-10-CM

## 2022-10-04 DIAGNOSIS — E119 Type 2 diabetes mellitus without complications: Secondary | ICD-10-CM

## 2022-10-04 DIAGNOSIS — I5032 Chronic diastolic (congestive) heart failure: Secondary | ICD-10-CM

## 2022-10-04 DIAGNOSIS — I2510 Atherosclerotic heart disease of native coronary artery without angina pectoris: Secondary | ICD-10-CM

## 2022-10-04 MED ORDER — TRESIBA FLEXTOUCH 200 UNIT/ML ~~LOC~~ SOPN
26.0000 [IU] | PEN_INJECTOR | Freq: Every day | SUBCUTANEOUS | 3 refills | Status: DC
Start: 2022-10-04 — End: 2023-09-01

## 2022-10-04 MED ORDER — GABAPENTIN 300 MG PO CAPS
300.0000 mg | ORAL_CAPSULE | Freq: Three times a day (TID) | ORAL | 3 refills | Status: AC
Start: 2022-10-04 — End: ?

## 2022-10-04 MED ORDER — GABAPENTIN 300 MG PO CAPS
300.0000 mg | ORAL_CAPSULE | Freq: Four times a day (QID) | ORAL | 3 refills | Status: DC
Start: 2022-10-04 — End: 2022-10-04

## 2022-10-04 MED ORDER — NOVOLOG FLEXPEN 100 UNIT/ML ~~LOC~~ SOPN
7.0000 [IU] | PEN_INJECTOR | Freq: Three times a day (TID) | SUBCUTANEOUS | 3 refills | Status: DC
Start: 2022-10-04 — End: 2022-11-15

## 2022-10-04 MED ORDER — NITROGLYCERIN 0.4 MG SL SUBL
0.4000 mg | SUBLINGUAL_TABLET | SUBLINGUAL | 3 refills | Status: AC | PRN
Start: 2022-10-04 — End: ?

## 2022-10-04 NOTE — Progress Notes (Signed)
Patient Care Team: Billey Co, MD as PCP - General (Family Medicine) Duke Salvia, MD as PCP - Electrophysiology (Cardiology) Meriam Sprague, MD as PCP - Cardiology (Cardiology) Callie Fielding, MD as Consulting Physician (Physical Medicine and Rehabilitation) Camille Bal, MD as Consulting Physician (Nephrology)   HPI  Anthony [name] is a 72 y.o. male admtted for battery replacement for previously implanted CRTD Cx by high LV thresholds and early battery depletion.  Permanent AF s/p AV ablation 3/19      History of ischemic heart disease with stenting 2003.   He was noted to have low voltage and left ventricular hypertrophy and the issue was raised as to whether he might have transthyretin amyloid technetium; pyrophosphate imaging was notable for only grade 1 findings.  He has not undergone electrophoresis testing.   No chest pain.  No shortness of breath.  Has had peripheral edema over the last week or so.  He eats out a lot.  Lives by himself.  Kids are approximate but not necessarily close.   DATE TEST EF     7/15  Myoview    No ischemia  9/15  Echo    55-60 %    6/18 Echo  25-30% Severe LAE   8/19 Echo  55-65    --/19 PYP   negative  10/21 Echo  60-65%    10/21 Myoview  45-55%    9/22 Echo 60 - 65 % Mild aortic calcification       Date Cr K Mg Hgb  10/17 1.55 3.4   13.7  7/18 1.82   1.5  13.8  9/18 2.2 4.5   13.5  3/19 2.09 3.3      4/19 2.5 5.3      7.20 2.94 4.9   13.1  4/21 2.87 4.4      10/22 2.89 4.2   13.2 (9/22)  8/23 2.7 4.7   12.6  3/24 1.95 3.9  14         Records and Results Reviewed   Past Medical History:  Diagnosis Date   Arthritis    Benign neoplasm of descending colon    Benign neoplasm of sigmoid colon    Benign neoplasm of transverse colon    CAD in native artery    a. reported MI 2000, 2001, s/p stenting of the circumflex lesion extending into the second obtuse marginal branch with a drug-eluting stent in 2003.  b. low risk nuc 2015.   Chronic atrial fibrillation (HCC)    Chronic combined systolic and diastolic CHF (congestive heart failure) (HCC)    a. Previously diastolic, then EF 25-30% in 10/2016.   CKD (chronic kidney disease), stage III (HCC)    Depression    Diabetes mellitus    Edema of extremities    Erectile dysfunction    GERD (gastroesophageal reflux disease)    Hyperlipidemia    Hypertension    Myocardial infarction (HCC) 2000&2001   Obesity    Onychomycosis    OSA (obstructive sleep apnea)    S/P ICD (internal cardiac defibrillator) procedure and BiV device,  07/25/17 [employer]  07/26/2017   Seizures (HCC)    Sleep apnea    Stroke (HCC)    Tubular adenoma of colon 10/2002    Past Surgical History:  Procedure Laterality Date   ANGIOPLASTY  2001   stent x 1   AV NODE ABLATION N/A 08/20/2017   Procedure: AV NODE ABLATION;  Surgeon: Duke Salvia,  MD;  Location: [facility] INVASIVE CV LAB;  Service: Cardiovascular;  Laterality: N/A;   BIV ICD GENERATOR CHANGEOUT N/A 05/17/2022   Procedure: BIV ICD GENERATOR CHANGEOUT;  Surgeon: Duke Salvia, MD;  Location: [facility] INVASIVE CV LAB;  Service: Cardiovascular;  Laterality: N/A;   BIV ICD INSERTION CRT-D N/A 07/25/2017   Procedure: BIV ICD INSERTION CRT-D;  Surgeon: Duke Salvia, MD;  Location: [facility] INVASIVE CV LAB;  Service: Cardiovascular;  Laterality: N/A;   COLONOSCOPY  2000?   negative   COLONOSCOPY WITH PROPOFOL N/A 02/08/2019   Procedure: COLONOSCOPY WITH PROPOFOL;  Surgeon: Meryl Dare, MD;  Location: [facility] ENDOSCOPY;  Service: Endoscopy;  Laterality: N/A;   FOOT ARTHROTOMY Right    POLYPECTOMY  02/08/2019   Procedure: POLYPECTOMY;  Surgeon: Meryl Dare, MD;  Location: [facility] ENDOSCOPY;  Service: Endoscopy;;   TOE AMPUTATION Left 2012    Current Outpatient Medications  Medication Sig Dispense Refill   acetaminophen (TYLENOL) 650 MG CR tablet Take 1,300 mg by mouth 2 (two) times daily as needed for pain.     apixaban (ELIQUIS) 5 MG  TABS tablet Take 1 tablet (5 mg total) by mouth 2 (two) times daily. 180 tablet 3   calcitRIOL (ROCALTROL) 0.5 MCG capsule Take 0.5 mcg by mouth daily.     carvedilol (COREG) 25 MG tablet TAKE 1 TABLET 2 TIMES DAILY. 180 tablet 3   Continuous Blood Gluc Receiver (FREESTYLE LIBRE 14 DAY READER) DEVI Apply topically as directed.     Continuous Blood Gluc Sensor (FREESTYLE LIBRE 14 DAY SENSOR) MISC 1 Device by Does not apply route every 14 (fourteen) days. 2 each 11   diclofenac Sodium (VOLTAREN) 1 % GEL Apply 4 g topically 4 (four) times daily. 350 g 0   furosemide (LASIX) 40 MG tablet Take 1 tablet (40 mg total) by mouth daily. 90 tablet 3   gabapentin (NEURONTIN) 300 MG capsule Take 1 capsule (300 mg total) by mouth 3 (three) times daily. 270 capsule 3   hydrALAZINE (APRESOLINE) 50 MG tablet Take 1 tablet (50 mg total) by mouth 3 (three) times daily. 270 tablet 3   insulin aspart (NOVOLOG FLEXPEN) 100 UNIT/ML FlexPen Inject 7-10 Units into the skin 3 (three) times daily with meals. INJECT 5-8 UNITS INTO THE SKIN 2 (TWO) TIMES DAILY BEFORE LUNCH AND SUPPER. INJECT 5 UNITS BEFORE LUNCH AND 5-8 UNITS BEFORE DINNER. 15 mL 3   insulin degludec (TRESIBA FLEXTOUCH) 200 UNIT/ML FlexTouch Pen Inject 26 Units into the skin daily. 15 mL 3   Insulin Pen Needle (PEN NEEDLES) 32G X 4 MM MISC 1 Device by Does not apply route as directed. 200 each 11   Lancets (ONETOUCH ULTRASOFT) lancets Use as instructed 100 each 12   latanoprost (XALATAN) 0.005 % ophthalmic solution Place 1 drop into both eyes at bedtime.     loratadine (CLARITIN) 10 MG tablet Take 10 mg by mouth daily as needed for allergies.     losartan (COZAAR) 25 MG tablet Take 1 tablet (25 mg total) by mouth daily. 90 tablet 3   Multiple Vitamin (MULTIVITAMIN) tablet Take 1 tablet by mouth daily.       nitroGLYCERIN (NITROSTAT) 0.4 MG SL tablet Place 1 tablet (0.4 mg total) under the tongue every 5 (five) minutes as needed for chest pain. If no relief by  3rd tab, call 911 25 tablet 3   omeprazole (PRILOSEC) 40 MG capsule TAKE ONE CAPSULE BY MOUTH DAILY BEFORE SUPPER 90 capsule 3   Oxycodone  HCl 10 MG TABS Take 10 mg by mouth 3 (three) times daily.     PARoxetine (PAXIL) 30 MG tablet TAKE 1 TABLET BY MOUTH EVERY DAY 90 tablet 3   potassium chloride SA (KLOR-CON M20) 20 MEQ tablet Take 1 tablet (20 mEq total) by mouth daily. 90 tablet 3   rosuvastatin (CRESTOR) 20 MG tablet Take 1 tablet (20 mg total) by mouth daily. 90 tablet 3   tamsulosin (FLOMAX) 0.4 MG CAPS capsule TAKE 1 CAPSULE BY MOUTH EVERYDAY AT BEDTIME 90 capsule 3   No current facility-administered medications for this visit.    Allergies  Allergen Reactions   Enalapril Maleate Cough      Social History   Tobacco Use   Smoking status: Former    Packs/day: 0.25    Years: 45.00    Additional pack years: 0.00    Total pack years: 11.25    Types: Cigarettes    Start date: 05/28/1967    Quit date: 10/28/2018    Years since quitting: 3.9    Passive exposure: Past   Smokeless tobacco: Never  Vaping Use   Vaping Use: Never used  Substance Use Topics   Alcohol use: No    Comment: quit in 1993   Drug use: No     Family History  Problem Relation Age of Onset   Heart attack Mother    CVA Mother    Diabetes Mother    Heart attack Father    Colon cancer Neg Hx    Stomach cancer Neg Hx      Current Meds  Medication Sig   acetaminophen (TYLENOL) 650 MG CR tablet Take 1,300 mg by mouth 2 (two) times daily as needed for pain.   apixaban (ELIQUIS) 5 MG TABS tablet Take 1 tablet (5 mg total) by mouth 2 (two) times daily.   calcitRIOL (ROCALTROL) 0.5 MCG capsule Take 0.5 mcg by mouth daily.   carvedilol (COREG) 25 MG tablet TAKE 1 TABLET 2 TIMES DAILY.   Continuous Blood Gluc Receiver (FREESTYLE LIBRE 14 DAY READER) DEVI Apply topically as directed.   Continuous Blood Gluc Sensor (FREESTYLE LIBRE 14 DAY SENSOR) MISC 1 Device by Does not apply route every 14 (fourteen) days.    diclofenac Sodium (VOLTAREN) 1 % GEL Apply 4 g topically 4 (four) times daily.   furosemide (LASIX) 40 MG tablet Take 1 tablet (40 mg total) by mouth daily.   gabapentin (NEURONTIN) 300 MG capsule Take 1 capsule (300 mg total) by mouth 3 (three) times daily.   hydrALAZINE (APRESOLINE) 50 MG tablet Take 1 tablet (50 mg total) by mouth 3 (three) times daily.   insulin aspart (NOVOLOG FLEXPEN) 100 UNIT/ML FlexPen Inject 7-10 Units into the skin 3 (three) times daily with meals. INJECT 5-8 UNITS INTO THE SKIN 2 (TWO) TIMES DAILY BEFORE LUNCH AND SUPPER. INJECT 5 UNITS BEFORE LUNCH AND 5-8 UNITS BEFORE DINNER.   insulin degludec (TRESIBA FLEXTOUCH) 200 UNIT/ML FlexTouch Pen Inject 26 Units into the skin daily.   Insulin Pen Needle (PEN NEEDLES) 32G X 4 MM MISC 1 Device by Does not apply route as directed.   Lancets (ONETOUCH ULTRASOFT) lancets Use as instructed   latanoprost (XALATAN) 0.005 % ophthalmic solution Place 1 drop into both eyes at bedtime.   loratadine (CLARITIN) 10 MG tablet Take 10 mg by mouth daily as needed for allergies.   losartan (COZAAR) 25 MG tablet Take 1 tablet (25 mg total) by mouth daily.   Multiple Vitamin (MULTIVITAMIN) tablet Take  1 tablet by mouth daily.     nitroGLYCERIN (NITROSTAT) 0.4 MG SL tablet Place 1 tablet (0.4 mg total) under the tongue every 5 (five) minutes as needed for chest pain. If no relief by 3rd tab, call 911   omeprazole (PRILOSEC) 40 MG capsule TAKE ONE CAPSULE BY MOUTH DAILY BEFORE SUPPER   Oxycodone HCl 10 MG TABS Take 10 mg by mouth 3 (three) times daily.   PARoxetine (PAXIL) 30 MG tablet TAKE 1 TABLET BY MOUTH EVERY DAY   potassium chloride SA (KLOR-CON M20) 20 MEQ tablet Take 1 tablet (20 mEq total) by mouth daily.   rosuvastatin (CRESTOR) 20 MG tablet Take 1 tablet (20 mg total) by mouth daily.   tamsulosin (FLOMAX) 0.4 MG CAPS capsule TAKE 1 CAPSULE BY MOUTH EVERYDAY AT BEDTIME     Review of Systems negative except from HPI and  PMH  Physical Exam BP (!) 112/56   Pulse 70   Ht 5\' 11"  (1.803 m)   Wt 214 lb 9.6 oz (97.3 kg)   SpO2 99%   BMI 29.93 kg/m  Well developed and well nourished in no acute distress HENT normal Neck supple with JVP-f 8 cm Clear Device pocket well healed; without hematoma or erythema.  There is no tethering  Regular rate and rhythm, no2/6 murmur Abd-soft with active BS No Clubbing cyanosis 2+L>R edema Skin-warm and dry A & Oriented  Grossly normal sensory and motor function  ECG ventricular pacing at 70 QRS negative lead I and negative lead V1  Device function is normal. Programming changes none  See Paceart for details     Assessment and  Plan  Atrial fibrillation-permanent rapid ventricular response   AV Node ablation    CRT-D-[employer]   Low-voltage limb leads and severe concentric LVH concerning for amyloid.  Negative PYP scan   Congestive heart failure-chronic-diastolic class IIa     Hypertension   Renal insufficiency grade 4   Presyncope   HFrecEF   Coronary artery disease with circumflex stenting remote     Volume overloaded.  No symptoms to suggest worsening ischemia.  Diet is replete with salt.  Have encouraged decrease sodium will increase his furosemide from 40--80 for 5 days and then back to 40.  Blood pressure is well-controlled.  Continue him on Apresoline and losartan.  No bleeding.  Continue on the Eliquis 5 twice daily.

## 2022-10-04 NOTE — Assessment & Plan Note (Signed)
Diabetes longstanding currently suboptimal control due to stress of pain. Patient is able to verbalize appropriate hypoglycemia management plan. Medication adherence appears good. Control is suboptimal due to pain, overcorrecting hypoglycemia with carbohydrates. -Decreased dose of basal insulin Tresiba (insulin degludec) from 28 units to 26 units daily in the morning to prevent lows. -Continued rapid insulin Novolog (insulin aspart) at 7-8 units at lunchtime and 8-10 units at dinner time. Provided refill for rapid insulin. -Patient educated on purpose, proper use, and potential adverse effects of medications.  -Extensively discussed pathophysiology of diabetes, recommended lifestyle interventions, dietary effects on blood sugar control.  -Counseled on s/sx of and management of hypoglycemia.  -Next A1c anticipated June 2024.  ASCVD risk - secondary prevention in patient with diabetes. Last LDL is 70 mg/dL not at goal of <84 mg/dL. -Continued Rosuvastatin 20 mg.  -Provided refill for SL NTG. Counseled on proper use and storage.  Hypertension longstanding currently uncontrolled due to pain. Blood pressure goal of <130 mmHg. Medication adherence good. Blood pressure control is suboptimal due to pain.  -Continued carvedilol, hydralazine, losartan.  Pain: Significant uncontrolled chronic pain in legs resulting in increased use of oxycodone PRN dose and disrupted sleep. -Increased gabapentin to 300 mg in the morning, 300 mg in the afternoon, and 600 mg at bedtime.

## 2022-10-04 NOTE — Assessment & Plan Note (Signed)
Significant uncontrolled chronic pain in legs resulting in increased use of oxycodone PRN dose and disrupted sleep. -Increased gabapentin to 300 mg in the morning, 300 mg in the afternoon, and 600 mg at bedtime.

## 2022-10-04 NOTE — Telephone Encounter (Signed)
Contacted patient and discussed slower dose escalation of gabapentin.    Currently taking daily.  Advised to take 300mg  TID until next week then we can reassess dosing at PCP visit.   Patient verbalized treatment plan.

## 2022-10-04 NOTE — Telephone Encounter (Signed)
Noted  

## 2022-10-04 NOTE — Patient Instructions (Signed)
Medication Instructions:  Your physician has recommended you make the following change in your medication:   ** Increase Furosemide 40mg  to 2 tablets (80mg ) x 5 days then return to your normal dosing.    *If you need a refill on your cardiac medications before your next appointment, please call your pharmacy*   Lab Work: None ordered.  If you have labs (blood work) drawn today and your tests are completely normal, you will receive your results only by: MyChart Message (if you have MyChart) OR A paper copy in the mail If you have any lab test that is abnormal or we need to change your treatment, we will call you to review the results.   Testing/Procedures: None ordered.    Follow-Up: At [facility], you and your health needs are our priority.  As part of our continuing mission to provide you with exceptional heart care, we have created designated Provider Care Teams.  These Care Teams include your primary Cardiologist (physician) and Advanced Practice Providers (APPs -  Physician Assistants and Nurse Practitioners) who all work together to provide you with the care you need, when you need it.  We recommend signing up for the patient portal called "MyChart".  Sign up information is provided on this After Visit Summary.  MyChart is used to connect with patients for Virtual Visits (Telemedicine).  Patients are able to view lab/test results, encounter notes, upcoming appointments, etc.  Non-urgent messages can be sent to your provider as well.   To learn more about what you can do with MyChart, go to [email or web address].    Your next appointment:   6 months with Dr Odessa Fleming PA-C

## 2022-10-04 NOTE — Progress Notes (Signed)
S:     Chief Complaint  Patient presents with   Medication Management    Dm follow up   72 y.o. male who presents for diabetes evaluation, education, and management.  PMH is significant for T2DM, HTN, CHF and chronic back pain.  Patient was referred by Dr. Leveda Anna on 10/03/21. Patient was last seen by Primary Care Provider, Dr. Miquel Dunn, on 09/06/2022. At last visit, Nilsa Nutting was increased to 28 units daily.  Last seen by pharmacy in 11/2021.    Today, patient arrives in good spirits despite pain level and presents ambulating with a cane. Patient presents with significant pain in legs leading to difficulty walking, discomfort while sitting, and disrupting sleep. Currently taking gabapentin 300 mg at bedtime and oxycodone 10 mg three times daily. Patient reports fourth PRN oxycodone 10 mg dose some days.   Current diabetes medications include: Tresiba (insulin degludec) 28 unit daily, Novolog (insulin aspart) 7-8 units with lunch, 8-10 units with dinner. Current hypertension medications include: carvedilol 25 mg BID, hydralazine 50 mg TID, losartan 25 mg daily,  Current hyperlipidemia medications include: Rosuvastatin 20 mg  Patient reports adherence to taking all medications as prescribed.   Do you feel that your medications are working for you? yes Have you been experiencing any side effects to the medications prescribed? no Do you have any problems obtaining medications due to transportation or finances? no Insurance coverage: [employer]  Patient reports hypoglycemic events. Reports shakiness and fatigue when he feels glucose declining. Will correct with peanut butter crackers.  Reported home fasting blood sugars: 200-300s  Patient reports nocturia (nighttime urination).  Patient reports neuropathy (nerve pain). Patient denies visual changes. Patient denies self foot exams.   O:   Review of Systems  Neurological:  Positive for sensory change.  All other systems reviewed  and are negative.   Physical Exam Constitutional:      Appearance: Normal appearance.  Cardiovascular:     Rate and Rhythm: Normal rate.  Pulmonary:     Effort: Pulmonary effort is normal.  Neurological:     Mental Status: He is alert.  Psychiatric:        Mood and Affect: Mood normal.        Behavior: Behavior normal.        Thought Content: Thought content normal.   Pain: 10/10 - legs  10/04/2022 CGM Download:  % Time CGM is active: 97% Average Glucose: 219 mg/dL Glucose Management Indicator: 8.5%  Glucose Variability: 35.4 (goal <36%) Time in Goal:  - Time in range 70-180: 32% - Time above range: 67% (High: 33%. Very High 34%) - Time below range: 1% (Very low: 0%) Observed patterns:    Lab Results  Component Value Date   HGBA1C 10.2 (H) 08/22/2022   Vitals:   10/04/22 1049 10/04/22 1117  BP: (!) 143/91 (!) 140/84  Pulse: 70 70  SpO2: 100%     Lipid Panel     Component Value Date/Time   CHOL 146 08/22/2022 1025   TRIG 91 08/22/2022 1025   HDL 59 08/22/2022 1025   CHOLHDL 2.5 08/22/2022 1025   CHOLHDL 3.2 02/28/2016 0906   VLDL 19 02/28/2016 0906   LDLCALC 70 08/22/2022 1025   LDLDIRECT 58 11/06/2011 1434    Clinical Atherosclerotic Cardiovascular Disease (ASCVD): Yes  The ASCVD Risk score (Arnett DK, et al., 2019) failed to calculate for the following reasons:   The patient has a prior MI or stroke diagnosis   A/P: Diabetes longstanding currently  suboptimal control due to stress of pain. Patient is able to verbalize appropriate hypoglycemia management plan. Medication adherence appears good. Control is suboptimal due to pain, overcorrecting hypoglycemia with carbohydrates. -Decreased dose of basal insulin Tresiba (insulin degludec) from 28 units to 26 units daily in the morning to prevent lows. -Continued rapid insulin Novolog (insulin aspart) at 7-8 units at lunchtime and 8-10 units at dinner time. Provided refill for rapid insulin. -Patient educated  on purpose, proper use, and potential adverse effects of medications.  -Extensively discussed pathophysiology of diabetes, recommended lifestyle interventions, dietary effects on blood sugar control.  -Counseled on s/sx of and management of hypoglycemia.  -Next A1c anticipated June 2024.  ASCVD risk - secondary prevention in patient with diabetes. Last LDL is 70 mg/dL not at goal of <96 mg/dL. -Continued Rosuvastatin 20 mg.  -Provided refill for SL NTG. Counseled on proper use and storage.  Hypertension longstanding currently uncontrolled due to pain. Blood pressure goal of <130 mmHg. Medication adherence good. Blood pressure control is suboptimal due to pain.  -Continued carvedilol, hydralazine, losartan.  Pain: Significant uncontrolled chronic pain in legs resulting in increased use of oxycodone PRN dose and disrupted sleep. -Increased gabapentin to 300 mg in the morning, 300 mg in the afternoon, and 600 mg at bedtime.   Written patient instructions provided. Patient verbalized understanding of treatment plan.  Total time in face to face counseling 27 minutes.    Follow-up:  Pharmacist 11/15/2022 at 10 AM. PCP clinic visit in 10/11/2022 with Dr. Miquel Dunn Patient seen with Haze Boyden, PharmD Candidate.

## 2022-10-04 NOTE — Patient Instructions (Addendum)
It was nice to see you today!  Your goal blood sugar is 80-130 before eating and less than 180 after eating.  Medication Changes: Continue all other medications  Decrease TRISEBA insulin degludec to 26 units daily  Increase Gabapentin 300 mg to 1 capsule in the morning, 1 capsule midday/afternoon, and 2 capsules at bedtime  Monitor blood sugars at home and keep a log (glucometer or piece of paper) to bring with you to your next visit.  Keep up the good work with diet and exercise. Aim for a diet full of vegetables, fruit and lean meats (chicken, [place], fish). Try to limit salt intake by eating fresh or frozen vegetables (instead of canned), rinse canned vegetables prior to cooking and do not add any additional salt to meals.

## 2022-10-04 NOTE — Assessment & Plan Note (Signed)
-  Provided refill for SL NTG.

## 2022-10-07 NOTE — Progress Notes (Signed)
    SUBJECTIVE:   CHIEF COMPLAINT / HPI:   T2DM- needing diabetic shoes and needed exam/forms filled out. Saw Dr Raymondo Band 10/04/22- decreased Edison Pace to 26 U qAM to prevent lows, continue Novolog 7-8U wit lunch and 8-10 units with dinner  Peripheral neuropathy- increased last week to gabapentin 300mg  TID. Using PRN oxycodone 10mg  for pain control. Notes symptoms have improved. Denies sedation/somnolence on increased dose.   HTN- on Coreg 1 tab mg BID, hydralazine, and losartan. No orthostasis or falls. No side effects noted.  Systolic and diastolic heart failure- he notes he saw his cardiologist on 5/10, and was noting increased edema in his legs. They doubled his lasix to 80 mg (2 tabs) x 1 week, he notes he took two tabs this AM. He feels the leg swelling has improved but is not back to baseline yet. He denies chest pain, dyspnea.   PERTINENT  PMH / PSH: Afib, CAD s/p PCI, HTN, CKD 3b, T2DM, systolic and diastolic HF, OSA   OBJECTIVE:   BP 126/74   Pulse 70   Ht 5\' 11"  (1.803 m)   Wt 216 lb 3.2 oz (98.1 kg)   SpO2 98%   BMI 30.15 kg/m   General: A&O, NAD HEENT: No sign of trauma, EOM grossly intact Cardiac: RRR, no m/r/g Respiratory: CTAB, normal WOB, no w/c/r GI: Soft, NTTP, non-distended  Extremities: NTTP, no peripheral edema. See foot exam below. Neuro: walks with cane moves all four extremities appropriately. Psych: Appropriate mood and affect  Diabetic foot exam: Dermatologic Exam: Nails: + onchomycosis. + nail bed thickening. Callouses: + callous over first metatarsal medially bilaterally. No fissures. Web spaces: No macerations or open lesions Redness/Erythema: None  Musculoskeletal Exam: No bunion, hammertoes, prominent metatarsals. Collapsed arch bilaterally. Vascular Assessment: Pedal hair growth present. 2+ posterior tibial and dorsalis pedis pulses. Neurologic Exam: 10-gram monofilament exam, R 6/6 and L 6/6.   ASSESSMENT/PLAN:   Insulin dependent type 2  diabetes mellitus (HCC) Diabetic foot exam for shoe order completed today, due to callous formation and previous amputation I do feel these are medically necessary Thickened toenails with discoloration noted- referral to podiatry for trimming Sensation intact, pulses intact   Peripheral neuropathy Symptoms improved on gabapentin 300mg  TID, without confusion/sedation, will continue current dose  HYPERTENSION, BENIGN SYSTEMIC Well controlled today, continue home medications  NICM (nonischemic cardiomyopathy) (HCC) Worsened leg swelling at cardiologist, improved today but not yet back to baseline Will check BMP today, I discussed I will reach out tomorrow if Cr stable may continue lasix 80 mg  (2 tabs) for another 5 days to get back to baseline, of note his weight is 2 lbs up from cardiologist office (although recognizing that could just be difference in scales) Discussed low salt diet as well     Billey Co, MD [facility]

## 2022-10-07 NOTE — Progress Notes (Signed)
Reviewed and agree with Dr Koval's plan.   

## 2022-10-10 NOTE — Telephone Encounter (Signed)
Will send to pre op to see if Dr. Odessa Fleming note (noted) is pre op clearance.

## 2022-10-11 ENCOUNTER — Encounter: Payer: Self-pay | Admitting: Family Medicine

## 2022-10-11 ENCOUNTER — Ambulatory Visit (INDEPENDENT_AMBULATORY_CARE_PROVIDER_SITE_OTHER): Payer: PPO | Admitting: Family Medicine

## 2022-10-11 VITALS — BP 126/74 | HR 70 | Ht 71.0 in | Wt 216.2 lb

## 2022-10-11 DIAGNOSIS — I5022 Chronic systolic (congestive) heart failure: Secondary | ICD-10-CM | POA: Diagnosis not present

## 2022-10-11 DIAGNOSIS — G629 Polyneuropathy, unspecified: Secondary | ICD-10-CM | POA: Insufficient documentation

## 2022-10-11 DIAGNOSIS — Z794 Long term (current) use of insulin: Secondary | ICD-10-CM

## 2022-10-11 DIAGNOSIS — I428 Other cardiomyopathies: Secondary | ICD-10-CM

## 2022-10-11 DIAGNOSIS — G6289 Other specified polyneuropathies: Secondary | ICD-10-CM

## 2022-10-11 DIAGNOSIS — I10 Essential (primary) hypertension: Secondary | ICD-10-CM

## 2022-10-11 DIAGNOSIS — E119 Type 2 diabetes mellitus without complications: Secondary | ICD-10-CM

## 2022-10-11 NOTE — Assessment & Plan Note (Signed)
Symptoms improved on gabapentin 300mg  TID, without confusion/sedation, will continue current dose

## 2022-10-11 NOTE — Patient Instructions (Signed)
It was wonderful to see you today.  Please bring ALL of your medications with you to every visit.   Today we talked about:  We did a foot exam and I sent in the form for your diabetic shoes. I have referred you to podiatry to get your toenails trimmed.  Lets keep your gabapentin where it is.  We will recheck your kdiney function today since you have been taking 80 mg (2 tabs) of lasix daily. If it looks ok we may continue this dose to keep your legs from swelling again.  Thank you for choosing [facility].   Please call [phone] with any questions about today's appointment.  Please arrive at least 15 minutes prior to your scheduled appointments.   If you had blood work today, I will send you a MyChart message or a letter if results are normal. Otherwise, I will give you a call.   If you had a referral placed, they will call you to set up an appointment. Please give [place] a call if you don't hear back in the next 2 weeks.   If you need additional refills before your next appointment, please call your pharmacy first.   Burley Saver, MD  Family Medicine

## 2022-10-11 NOTE — Assessment & Plan Note (Signed)
Diabetic foot exam for shoe order completed today, due to callous formation and previous amputation I do feel these are medically necessary Thickened toenails with discoloration noted- referral to podiatry for trimming Sensation intact, pulses intact

## 2022-10-11 NOTE — Assessment & Plan Note (Addendum)
Worsened leg swelling at cardiologist, improved today but not yet back to baseline Will check BMP today, I discussed I will reach out tomorrow if Cr stable may continue lasix 80 mg  (2 tabs) for another 5 days to get back to baseline, of note his weight is 2 lbs up from cardiologist office (although recognizing that could just be difference in scales) Discussed low salt diet as well

## 2022-10-11 NOTE — Telephone Encounter (Signed)
   Primary Cardiologist: Meriam Sprague, MD  Chart reviewed as part of pre-operative protocol coverage. Given past medical history and time since last visit, based on ACC/AHA guidelines, Anthony [name] would be at acceptable risk for the planned procedure without further cardiovascular testing.   Patient was advised that if he develops new symptoms prior to surgery to contact our office to arrange a follow-up appointment.  He verbalized understanding.  I contacted patient to ensure that he is feeling better since his furosemide was increased at office visit with Dr. Graciela Husbands on 10/04/2022.  He is not having any concerning symptoms and is able to achieve > 4 METS activity.I have reviewed with patient that he should contact our office once he has procedure date.   Per office protocol, patient can hold Eliquis for 3 days prior to procedure. He will require bridging with Lovenox while off of Eliquis per Dr Shari Prows (cleared for previous ESI in January 2024 with rec for bridge). Pt should call clinic once ESI is scheduled so that we can provide Lovenox bridge instructions.   I will route this recommendation to the requesting party via Epic fax function and remove from pre-op pool.  Please call with questions.  Levi Aland, NP-C  10/11/2022, 8:33 AM [address], [address] Office [phone] Fax [phone]

## 2022-10-11 NOTE — Assessment & Plan Note (Signed)
Well controlled today, continue home medications

## 2022-10-11 NOTE — Telephone Encounter (Signed)
I s/w Burna Mortimer with [facility]. I called to ask if they had a date in mind for procedure. Burna Mortimer, stated not yet, though she did leave a message to call her back to schedule. Burna Mortimer, states she has the notes that were sent earlier today by our office and is aware the pt will need Lovenox bridging. Burna Mortimer, states she will let the pt know when she s/w him that he has to call the coumadin clinic to set up Lovenox bridge. I thanked Burna Mortimer for her help. I will forward to pharm-d as FYI as well.

## 2022-10-12 LAB — BASIC METABOLIC PANEL
BUN/Creatinine Ratio: 13 (ref 10–24)
BUN: 30 mg/dL — ABNORMAL HIGH (ref 8–27)
CO2: 26 mmol/L (ref 20–29)
Calcium: 9 mg/dL (ref 8.6–10.2)
Chloride: 100 mmol/L (ref 96–106)
Creatinine, Ser: 2.3 mg/dL — ABNORMAL HIGH (ref 0.76–1.27)
Glucose: 216 mg/dL — ABNORMAL HIGH (ref 70–99)
Potassium: 3.6 mmol/L (ref 3.5–5.2)
Sodium: 141 mmol/L (ref 134–144)
eGFR: 30 mL/min/{1.73_m2} — ABNORMAL LOW (ref >59)

## 2022-10-14 ENCOUNTER — Telehealth: Payer: Self-pay | Admitting: Family Medicine

## 2022-10-14 DIAGNOSIS — N184 Chronic kidney disease, stage 4 (severe): Secondary | ICD-10-CM

## 2022-10-14 NOTE — Telephone Encounter (Signed)
Called and left a voice message for patient to give me a call back at the office today ujtil 5PM

## 2022-10-14 NOTE — Telephone Encounter (Signed)
Late entry-  Called patient on 10/11/21 at 1030 in AM, got voicemail and left message saying increased Cr and recommend decreasing lasix back to once a day.   Called patient today 10/14/22 and he notes he got my message and has gone back to one tab a day lasix. Also discussed repeat BMP on Friday to monitor kidney function, he is in agreement with this. Future lab order placed, he notes his leg swelling has improved and still urinating.  All questions and concerns addressed.  Burley Saver MD

## 2022-10-14 NOTE — Telephone Encounter (Signed)
Patient is returning call. Transferred to [employer], CMA.

## 2022-10-14 NOTE — Telephone Encounter (Signed)
Patient is calling back to give update on procedure and to find out information on Pharm-D appt. Please advise.

## 2022-10-14 NOTE — Telephone Encounter (Signed)
Patient returned call. Anthony [name] stated that his procedure is scheduled for 10/23/22.

## 2022-10-15 NOTE — Telephone Encounter (Signed)
5/25: Take Eliquis is the AM. No evening Eliquis. Give enoxaparin 150mg  into the belly at 8PM  5/26: Inject enoxaparin in the fatty tissue at 8pm. Rotate injection sites. No Eliquis.  5/27: Inject enoxaparin in the fatty tissue every at 8pm. Rotate injection site. No Eliquis.  5/28: No enoxaparin, No Eliquis  5/29: Procedure Day - No enoxaparin - No Eliquis  Resume Eliquis as soon as safely possible as deemed by surgeon.  Adjusted BW Crcl= 60ml/min Plt 136  Called pt to review bridge. LVM for pt to call back

## 2022-10-17 ENCOUNTER — Ambulatory Visit: Payer: BC Managed Care – PPO | Admitting: Podiatry

## 2022-10-17 NOTE — Patient Instructions (Addendum)
[name] , Thank you for taking time to come for your Medicare Wellness Visit. I appreciate your ongoing commitment to your health goals. Please review the following plan we discussed and let me know if I can assist you in the future.   These are the goals we discussed:  Goals       I would like to optimize my diabetes management (pt-stated)      Current Barriers:  Diabetes: T2DM; complicated by chronic medical conditions including HTN, HLD, most recent A1c 7.8% Current antihyperglycemic regimen: Ozempic 0.5mg  weekly, Tresiba 44 units Will continue to optimize GLP1 Ozempic sample given-1 box, LOT [id number], EXP 05/2021 Patient able to now get [place] 31-month supply for $0 copay under HTA insurance at local pharmacy (picked up on 07/13/19) Novo requiring LIS/extra help application-complete with patient via telephone Patient denied for LIS--will submit to [employer] for [employer] with proof of denial [employer] CPh[employer], Lilla Shook following for patient assistance Set up patient's [employer] CGM--he is excited about this technology Denies Hypogylcemia now that he is on basal insulin; Denies hyperglycemic symptoms Current meal patterns: Breakfast: eggs,bacon, toast-coffee (no sugar) Lunch: sometimes skips, will have a pack of crackers around 3pm Supper: at 5-6pm chicken/fish, veggie, baked potato (encouraged pt to limit salt/half of potato) Snacks: not a big snacker Drinks: avoids sugary drinks, mostly coffee, tea or water Discovered patient was drinking gingerale x2 daily at times.  Reviewed sugar content 36gm in 12oz.  Encouraged pt to try diet or bubbly flavored water alternative Current exercise: attempts to walk 10-96min per day, limited by back pain, muscle weakness improving Current blood glucose readings: checks Bgs around 10am-1pm: per glucometer--patient has not checked Bgs consistently.  Readings in the AM 90-125 Cardiovascular risk reduction: Current hypertensive regimen:  hydralazine, labetalol (ACEi induced cough) Encouraged patient to check BP 3x weekly He brought in [employer] BP machine  BP at recent Kidney MD visit was 130/88.  Goal 130/80. Readings 150-160/80-90 on current BP machine Encouraged low salt diet; discussed foods Current hyperlipidemia regimen: rosuvastatin 5mg  qHS (tolerating well) last LDL 100 in 11/2018 Current antiplatelet regimen: apixaban  Neuropathy: Patient restarted gabapentin 300mg  qHS on 05/20/19.  He states he experienced muscle weakness when he stopped taking this medication.  This condition is improving with re-initiation of gabapentin.  Continue current management   Pharmacist Clinical Goal(s):  Over the next 90 days, patient will work with PharmD and primary care provider to address related to optimization of medication management of diabetes  Interventions: Comprehensive medication review performed, medication list updated in electronic medical record Reviewed & discussed the following diabetes-related information with patient: Reviewed ADA recommended "diabetes-friendly" diet  (reviewed healthy snack/food options) Discussed insulin/GLP-1 injection technique; Patient uses One touch Verio glucometer Reviewed medication purpose/side effects-->patient denies adverse events  Patient Self Care Activities:  Patient will check blood glucose daily, document, and provide at future appointments Patient will focus on medication adherence Patient will take medications as prescribed Patient will contact provider with any episodes of hypoglycemia Patient will report any questions or concerns to provider   Please see past updates related to this goal by clicking on the "Past Updates" button in the selected goal        Remain active and independent        This is a list of the screening recommended for you and due dates:  Health Maintenance  Topic Date Due   Zoster (Shingles) Vaccine (2 of 2) 02/22/2019   Medicare Annual Wellness  Visit  10/12/2019   Stool Blood Test  10/11/2020   COVID-19 Vaccine (7 - 2023-24 season) 05/23/2022   Flu Shot  12/26/2022   Hemoglobin A1C  02/22/2023   Yearly kidney health urinalysis for diabetes  09/06/2023   Eye exam for diabetics  09/11/2023   Yearly kidney function blood test for diabetes  10/11/2023   Complete foot exam   10/11/2023   DTaP/Tdap/Td vaccine (3 - Td or Tdap) 10/27/2023   Colon Cancer Screening  02/07/2029   Pneumonia Vaccine  Completed   Hepatitis C Screening  Completed   HPV Vaccine  Aged Out    Advanced directives: We have a copy of your advanced directives available in your record should your provider ever need to access them.   Conditions/risks identified: Aim for 30 minutes of exercise or brisk walking, 6-8 glasses of water, and 5 servings of fruits and vegetables each day.  Next appointment: Follow up in one year for your annual wellness visit.   Preventive Care 32 Years and Older, Male  Preventive care refers to lifestyle choices and visits with your health care provider that can promote health and wellness. What does preventive care include? A yearly physical exam. This is also called an annual well check. Dental exams once or twice a year. Routine eye exams. Ask your health care provider how often you should have your eyes checked. Personal lifestyle choices, including: Daily care of your teeth and gums. Regular physical activity. Eating a healthy diet. Avoiding tobacco and drug use. Limiting alcohol use. Practicing safe sex. Taking low doses of aspirin every day. Taking vitamin and mineral supplements as recommended by your health care provider. What happens during an annual well check? The services and screenings done by your health care provider during your annual well check will depend on your age, overall health, lifestyle risk factors, and family history of disease. Counseling  Your health care provider may ask you questions about  your: Alcohol use. Tobacco use. Drug use. Emotional well-being. Home and relationship well-being. Sexual activity. Eating habits. History of falls. Memory and ability to understand (cognition). Work and work Astronomer. Screening  You may have the following tests or measurements: Height, weight, and BMI. Blood pressure. Lipid and cholesterol levels. These may be checked every 5 years, or more frequently if you are over 40 years old. Skin check. Lung cancer screening. You may have this screening every year starting at age 40 if you have a 30-pack-year history of smoking and currently smoke or have quit within the past 15 years. Fecal occult blood test (FOBT) of the stool. You may have this test every year starting at age 74. Flexible sigmoidoscopy or colonoscopy. You may have a sigmoidoscopy every 5 years or a colonoscopy every 10 years starting at age 21. Prostate cancer screening. Recommendations will vary depending on your family history and other risks. Hepatitis C blood test. Hepatitis B blood test. Sexually transmitted disease (STD) testing. Diabetes screening. This is done by checking your blood sugar (glucose) after you have not eaten for a while (fasting). You may have this done every 1-3 years. Abdominal aortic aneurysm (AAA) screening. You may need this if you are a current or former smoker. Osteoporosis. You may be screened starting at age 27 if you are at high risk. Talk with your health care provider about your test results, treatment options, and if necessary, the need for more tests. Vaccines  Your health care provider may recommend certain vaccines, such as: Influenza vaccine. This is recommended  every year. Tetanus, diphtheria, and acellular pertussis (Tdap, Td) vaccine. You may need a Td booster every 10 years. Zoster vaccine. You may need this after age 54. Pneumococcal 13-valent conjugate (PCV13) vaccine. One dose is recommended after age 53. Pneumococcal  polysaccharide (PPSV23) vaccine. One dose is recommended after age 81. Talk to your health care provider about which screenings and vaccines you need and how often you need them. This information is not intended to replace advice given to you by your health care provider. Make sure you discuss any questions you have with your health care provider. Document Released: 06/09/2015 Document Revised: 01/31/2016 Document Reviewed: 03/14/2015 Elsevier Interactive Patient Education  2017 [employer].  Fall Prevention in the Home Falls can cause injuries. They can happen to people of all ages. There are many things you can do to make your home safe and to help prevent falls. What can I do on the outside of my home? Regularly fix the edges of walkways and driveways and fix any cracks. Remove anything that might make you trip as you walk through a door, such as a raised step or threshold. Trim any bushes or trees on the path to your home. Use bright outdoor lighting. Clear any walking paths of anything that might make someone trip, such as rocks or tools. Regularly check to see if handrails are loose or broken. Make sure that both sides of any steps have handrails. Any raised decks and porches should have guardrails on the edges. Have any leaves, snow, or ice cleared regularly. Use sand or salt on walking paths during winter. Clean up any spills in your garage right away. This includes oil or grease spills. What can I do in the bathroom? Use night lights. Install grab bars by the toilet and in the tub and shower. Do not use towel bars as grab bars. Use non-skid mats or decals in the tub or shower. If you need to sit down in the shower, use a plastic, non-slip stool. Keep the floor dry. Clean up any water that spills on the floor as soon as it happens. Remove soap buildup in the tub or shower regularly. Attach bath mats securely with double-sided non-slip rug tape. Do not have throw rugs and other  things on the floor that can make you trip. What can I do in the bedroom? Use night lights. Make sure that you have a light by your bed that is easy to reach. Do not use any sheets or blankets that are too big for your bed. They should not hang down onto the floor. Have a firm chair that has side arms. You can use this for support while you get dressed. Do not have throw rugs and other things on the floor that can make you trip. What can I do in the kitchen? Clean up any spills right away. Avoid walking on wet floors. Keep items that you use a lot in easy-to-reach places. If you need to reach something above you, use a strong step stool that has a grab bar. Keep electrical cords out of the way. Do not use floor polish or wax that makes floors slippery. If you must use wax, use non-skid floor wax. Do not have throw rugs and other things on the floor that can make you trip. What can I do with my stairs? Do not leave any items on the stairs. Make sure that there are handrails on both sides of the stairs and use them. Fix handrails that are broken  or loose. Make sure that handrails are as long as the stairways. Check any carpeting to make sure that it is firmly attached to the stairs. Fix any carpet that is loose or worn. Avoid having throw rugs at the top or bottom of the stairs. If you do have throw rugs, attach them to the floor with carpet tape. Make sure that you have a light switch at the top of the stairs and the bottom of the stairs. If you do not have them, ask someone to add them for you. What else can I do to help prevent falls? Wear shoes that: Do not have high heels. Have rubber bottoms. Are comfortable and fit you well. Are closed at the toe. Do not wear sandals. If you use a stepladder: Make sure that it is fully opened. Do not climb a closed stepladder. Make sure that both sides of the stepladder are locked into place. Ask someone to hold it for you, if possible. Clearly  mark and make sure that you can see: Any grab bars or handrails. First and last steps. Where the edge of each step is. Use tools that help you move around (mobility aids) if they are needed. These include: Canes. Walkers. Scooters. Crutches. Turn on the lights when you go into a dark area. Replace any light bulbs as soon as they burn out. Set up your furniture so you have a clear path. Avoid moving your furniture around. If any of your floors are uneven, fix them. If there are any pets around you, be aware of where they are. Review your medicines with your doctor. Some medicines can make you feel dizzy. This can increase your chance of falling. Ask your doctor what other things that you can do to help prevent falls. This information is not intended to replace advice given to you by your health care provider. Make sure you discuss any questions you have with your health care provider. Document Released: 03/09/2009 Document Revised: 10/19/2015 Document Reviewed: 06/17/2014 Elsevier Interactive Patient Education  2017 [employer].

## 2022-10-17 NOTE — Progress Notes (Signed)
Subjective:   Anthony [name] is a 72 y.o. male who presents for Medicare Annual/Subsequent preventive examination.  I connected with  [name] on 10/18/22 by a audio enabled telemedicine application and verified that I am speaking with the correct person using two identifiers.  Patient Location: Home  Provider Location: Home Office  I discussed the limitations of evaluation and management by telemedicine. The patient expressed understanding and agreed to proceed.  Review of Systems     Cardiac Risk Factors include: advanced age (>83men, >62 women);diabetes mellitus;dyslipidemia;hypertension;male gender;smoking/ tobacco exposure     Objective:    Today's Vitals   10/18/22 0941  Weight: 216 lb (98 kg)  Height: 5\' 11"  (1.803 m)   Body mass index is 30.13 kg/m.     10/18/2022    9:45 AM 09/06/2022   10:01 AM 06/17/2022   11:05 AM 05/02/2022   11:32 AM 03/28/2022   11:04 AM 01/07/2022   10:00 AM 10/03/2021    2:17 PM  Advanced Directives  Does Patient Have a Medical Advance Directive? No No No No No Yes Yes  Type of Careers adviser;Living will Healthcare Power of Attorney  Copy of Healthcare Power of Attorney in Chart?      Yes - validated most recent copy scanned in chart (See row information) No - copy requested  Would patient like information on creating a medical advance directive? Yes (MAU/Ambulatory/Procedural Areas - Information given) Yes (MAU/Ambulatory/Procedural Areas - Information given) No - Patient declined No - Patient declined No - Patient declined      Current Medications (verified) Outpatient Encounter Medications as of 10/18/2022  Medication Sig   acetaminophen (TYLENOL) 650 MG CR tablet Take 1,300 mg by mouth 2 (two) times daily as needed for pain.   apixaban (ELIQUIS) 5 MG TABS tablet Take 1 tablet (5 mg total) by mouth 2 (two) times daily.   calcitRIOL (ROCALTROL) 0.5 MCG capsule Take 0.5 mcg by mouth daily.    carvedilol (COREG) 25 MG tablet TAKE 1 TABLET 2 TIMES DAILY.   Continuous Blood Gluc Receiver (FREESTYLE LIBRE 14 DAY READER) DEVI Apply topically as directed.   Continuous Blood Gluc Sensor (FREESTYLE LIBRE 14 DAY SENSOR) MISC 1 Device by Does not apply route every 14 (fourteen) days.   diclofenac Sodium (VOLTAREN) 1 % GEL Apply 4 g topically 4 (four) times daily.   furosemide (LASIX) 40 MG tablet Take 1 tablet (40 mg total) by mouth daily.   gabapentin (NEURONTIN) 300 MG capsule Take 1 capsule (300 mg total) by mouth 3 (three) times daily.   hydrALAZINE (APRESOLINE) 50 MG tablet Take 1 tablet (50 mg total) by mouth 3 (three) times daily.   insulin aspart (NOVOLOG FLEXPEN) 100 UNIT/ML FlexPen Inject 7-10 Units into the skin 3 (three) times daily with meals. INJECT 5-8 UNITS INTO THE SKIN 2 (TWO) TIMES DAILY BEFORE LUNCH AND SUPPER. INJECT 5 UNITS BEFORE LUNCH AND 5-8 UNITS BEFORE DINNER.   insulin degludec (TRESIBA FLEXTOUCH) 200 UNIT/ML FlexTouch Pen Inject 26 Units into the skin daily.   Insulin Pen Needle (PEN NEEDLES) 32G X 4 MM MISC 1 Device by Does not apply route as directed.   Lancets (ONETOUCH ULTRASOFT) lancets Use as instructed   latanoprost (XALATAN) 0.005 % ophthalmic solution Place 1 drop into both eyes at bedtime.   loratadine (CLARITIN) 10 MG tablet Take 10 mg by mouth daily as needed for allergies.   losartan (COZAAR) 25 MG tablet  Take 1 tablet (25 mg total) by mouth daily.   Multiple Vitamin (MULTIVITAMIN) tablet Take 1 tablet by mouth daily.     nitroGLYCERIN (NITROSTAT) 0.4 MG SL tablet Place 1 tablet (0.4 mg total) under the tongue every 5 (five) minutes as needed for chest pain. If no relief by 3rd tab, call 911   omeprazole (PRILOSEC) 40 MG capsule TAKE ONE CAPSULE BY MOUTH DAILY BEFORE SUPPER   Oxycodone HCl 10 MG TABS Take 10 mg by mouth 3 (three) times daily.   PARoxetine (PAXIL) 30 MG tablet TAKE 1 TABLET BY MOUTH EVERY DAY   potassium chloride SA (KLOR-CON M20) 20 MEQ  tablet Take 1 tablet (20 mEq total) by mouth daily.   rosuvastatin (CRESTOR) 20 MG tablet Take 1 tablet (20 mg total) by mouth daily.   tamsulosin (FLOMAX) 0.4 MG CAPS capsule TAKE 1 CAPSULE BY MOUTH EVERYDAY AT BEDTIME   No facility-administered encounter medications on file as of 10/18/2022.    Allergies (verified) Enalapril maleate   History: Past Medical History:  Diagnosis Date   Arthritis    Benign neoplasm of descending colon    Benign neoplasm of sigmoid colon    Benign neoplasm of transverse colon    CAD in native artery    a. reported MI 2000, 2001, s/p stenting of the circumflex lesion extending into the second obtuse marginal branch with a drug-eluting stent in 2003. b. low risk nuc 2015.   Chronic atrial fibrillation (HCC)    Chronic combined systolic and diastolic CHF (congestive heart failure) (HCC)    a. Previously diastolic, then EF 25-30% in 10/2016.   CKD (chronic kidney disease), stage III (HCC)    Depression    Diabetes mellitus    Edema of extremities    Erectile dysfunction    GERD (gastroesophageal reflux disease)    Hyperlipidemia    Hypertension    Myocardial infarction (HCC) 2000&2001   Obesity    Onychomycosis    OSA (obstructive sleep apnea)    S/P ICD (internal cardiac defibrillator) procedure and BiV device,  07/25/17 [employer]onic  07/26/2017   Seizures (HCC)    Sleep apnea    Stroke (HCC)    Tubular adenoma of colon 10/2002   Past Surgical History:  Procedure Laterality Date   ANGIOPLASTY  2001   stent x 1   AV NODE ABLATION N/A 08/20/2017   Procedure: AV NODE ABLATION;  Surgeon: Duke Salvia, MD;  Location[facility] INVASIVE CV LAB;  Service: Cardiovascular;  Laterality: N/A;   BIV ICD GENERATOR CHANGEOUT N/A 05/17/2022   Procedure: BIV ICD GENERATOR CHANGEOUT;  Surgeon: Duke Salvia, MD;  Location: [facility] INVASIVE CV LAB;  Service: Cardiovascular;  Laterality: N/A;   BIV ICD INSERTION CRT-D N/A 07/25/2017   Procedure: BIV ICD INSERTION CRT-D;   Surgeon: Duke Salvia, MD;  Location: [facility] INVASIVE CV LAB;  Service: Cardiovascular;  Laterality: N/A;   COLONOSCOPY  2000?   negative   COLONOSCOPY WITH PROPOFOL N/A 02/08/2019   Procedure: COLONOSCOPY WITH PROPOFOL;  Surgeon: Meryl Dare, MD;  Location: [facility] ENDOSCOPY;  Service: Endoscopy;  Laterality: N/A;   FOOT ARTHROTOMY Right    POLYPECTOMY  02/08/2019   Procedure: POLYPECTOMY;  Surgeon: Meryl Dare, MD;  Location: [facility] ENDOSCOPY;  Service: Endoscopy;;   TOE AMPUTATION Left 2012   Family History  Problem Relation Age of Onset   Heart attack Mother    CVA Mother    Diabetes Mother    Heart attack Father  Colon cancer Neg Hx    Stomach cancer Neg Hx    Social History   Socioeconomic History   Marital status: Married    Spouse name: Angelique Blonder   Number of children: 2   Years of education: GED   Highest education level: Not on file  Occupational History   Occupation: CUSTOMER SERVICES    Employer: [employer]    Comment: [employer]  Tobacco Use   Smoking status: Former    Packs/day: 0.25    Years: 45.00    Additional pack years: 0.00    Total pack years: 11.25    Types: Cigarettes    Start date: 05/28/1967    Quit date: 10/28/2018    Years since quitting: 3.9    Passive exposure: Past   Smokeless tobacco: Never  Vaping Use   Vaping Use: Never used  Substance and Sexual Activity   Alcohol use: No    Comment: quit in 1993   Drug use: No   Sexual activity: Not on file  Other Topics Concern   Not on file  Social History Narrative   Patient is right handed, consumes caffeine rarely. Patient resides in home with wife.   Social Determinants of Health   Financial Resource Strain: Medium Risk (10/18/2022)   Overall Financial Resource Strain (CARDIA)    Difficulty of Paying Living Expenses: Somewhat hard  Food Insecurity: No Food Insecurity (10/18/2022)   Hunger Vital Sign    Worried About Running Out of Food in the Last Year: Never true    Ran Out of Food in the Last  Year: Never true  Transportation Needs: No Transportation Needs (10/18/2022)   PRAPARE - Administrator, Civil Service (Medical): No    Lack of Transportation (Non-Medical): No  Physical Activity: Sufficiently Active (10/18/2022)   Exercise Vital Sign    Days of Exercise per Week: 5 days    Minutes of Exercise per Session: 30 min  Stress: No Stress Concern Present (10/18/2022)   [employer] of Occupational Health - Occupational Stress Questionnaire    Feeling of Stress : Not at all  Social Connections: Moderately Integrated (10/18/2022)   Social Connection and Isolation Panel [NHANES]    Frequency of Communication with Friends and Family: More than three times a week    Frequency of Social Gatherings with Friends and Family: Three times a week    Attends Religious Services: More than 4 times per year    Active Member of Clubs or Organizations: No    Attends Banker Meetings: Never    Marital Status: Married    Tobacco Counseling Counseling given: Not Answered   Clinical Intake:  Pre-visit preparation completed: Yes  Pain : No/denies pain  Diabetes: Yes CBG done?: No Did pt. bring in CBG monitor from home?: No  How often do you need to have someone help you when you read instructions, pamphlets, or other written materials from your doctor or pharmacy?: 1 - Never  Diabetic?Yes  Nutrition Risk Assessment:  Has the patient had any N/V/D within the last 2 months?  No  Does the patient have any non-healing wounds?  No  Has the patient had any unintentional weight loss or weight gain?  No   Diabetes:  Is the patient diabetic?  Yes  If diabetic, was a CBG obtained today?  No  Did the patient bring in their glucometer from home?  No  How often do you monitor your CBG's? Daily .   Financial Strains and  Diabetes Management:  Are you having any financial strains with the device, your supplies or your medication?  Yes, cost of Eliquis  .  Does the  patient want to be seen by Chronic Care Management for management of their diabetes?  No  Would the patient like to be referred to a Nutritionist or for Diabetic Management?  No   Diabetic Exams:  Diabetic Eye Exam: Completed 09/11/22 Diabetic Foot Exam: Completed 10/11/22   Interpreter Needed?: No  Information entered by :: Kandis Fantasia LPN   Activities of Daily Living    10/18/2022    9:45 AM  In your present state of health, do you have any difficulty performing the following activities:  Hearing? 0  Vision? 0  Difficulty concentrating or making decisions? 0  Walking or climbing stairs? 0  Dressing or bathing? 0  Doing errands, shopping? 0  Preparing Food and eating ? N  Using the Toilet? N  In the past six months, have you accidently leaked urine? N  Do you have problems with loss of bowel control? N  Managing your Medications? N  Managing your Finances? N  Housekeeping or managing your Housekeeping? N    Patient Care Team: Billey Co, MD as PCP - General (Family Medicine) Duke Salvia, MD as PCP - Electrophysiology (Cardiology) Meriam Sprague, MD as PCP - Cardiology (Cardiology) Callie Fielding, MD as Consulting Physician (Physical Medicine and Rehabilitation) Camille Bal, MD as Consulting Physician (Nephrology)  Indicate any recent Medical Services you may have received from other than [facility] providers in the past year (date may be approximate).     Assessment:   This is a routine wellness examination for Corron.  Hearing/Vision screen Hearing Screening - Comments:: Denies hearing difficulties   Vision Screening - Comments:: Wears rx glasses - up to date with routine eye exams with [facility]    Dietary issues and exercise activities discussed: Current Exercise Habits: Home exercise routine, Type of exercise: walking;stretching, Time (Minutes): 30, Frequency (Times/Week): 5, Weekly Exercise (Minutes/Week): 150, Intensity:  Mild   Goals Addressed               This Visit's Progress     COMPLETED: Advance Directives        Current Barriers:  Knowledge Deficits and Care Coordination needs related to Advanced Directives for patient with HTN and DMII   Reviewed documents and has questions Clinical Social Work Clinical Goal(s):  Over the next 45 days, the patient will  work with care management team on completion of Advance Directive   And provide notarized copy for provider's office Interventions provided by LCSW: Assessed barriers and discussed concerns with completing advance directive Will review documents with patient during next provider office visit Patient Self Care Activities:  Is able to complete documentation independently Able to identify next of kin or power or attorney Please see past updates related to this goal by clicking on the "Past Updates" button in the selected goal        COMPLETED: Quit Smoking (pt-stated)        Discussed progress on smoking cessation. Client states he is receiving information regarding smoking cessation.  Client reports improvement. States he has decreased to about 5 cigarettes or less a week. 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 Declines additional information. Call RN if you would like the [employer] Health Coach assist you with achieving this goal.       Remain active and independent  Depression Screen    10/18/2022    9:43 AM 10/11/2022   10:08 AM 09/06/2022   10:02 AM 05/02/2022   11:35 AM 03/28/2022   11:27 AM 01/07/2022   10:00 AM 10/03/2021    2:17 PM  PHQ 2/9 Scores  PHQ - 2 Score 0 0 0 0 0 0 0  PHQ- 9 Score 0 0 0 0 0  0    Fall Risk    10/18/2022    9:42 AM 10/11/2022   10:08 AM 09/06/2022   10:11 AM 06/17/2022   11:05 AM 05/02/2022   11:32 AM  Fall Risk   Falls in the past year? 0 0 0 0 0  Number falls in past yr: 0   0   Injury with Fall? 0   0   Risk for fall due to : No Fall Risks   Impaired balance/gait    Follow up Falls prevention discussed;Education provided;Falls evaluation completed   Falls prevention discussed     FALL RISK PREVENTION PERTAINING TO THE HOME:  Any stairs in or around the home? No  If so, are there any without handrails? No  Home free of loose throw rugs in walkways, pet beds, electrical cords, etc? Yes  Adequate lighting in your home to reduce risk of falls? Yes   ASSISTIVE DEVICES UTILIZED TO PREVENT FALLS:  Life alert? No  Use of a cane, walker or w/c? No  Grab bars in the bathroom? Yes  Shower chair or bench in shower? No  Elevated toilet seat or a handicapped toilet? Yes   TIMED UP AND GO:  Was the test performed? No . Telephonic visit   Cognitive Function:        10/18/2022    9:45 AM 10/12/2018    3:34 PM  6CIT Screen  What Year? 0 points 0 points  What month? 0 points 0 points  What time? 0 points 0 points  Count back from 20 0 points 0 points  Months in reverse 0 points 2 points  Repeat phrase 2 points 0 points  Total Score 2 points 2 points    Immunizations Immunization History  Administered Date(s) Administered   COVID-19, mRNA, vaccine(Comirnaty)12 years and older 03/28/2022   Fluad Quad(high Dose 65+) 02/05/2019, 02/27/2021, 03/13/2022   Influenza Split 02/28/2011, 02/26/2012   Influenza Whole 03/09/2007, 02/25/2008, 03/30/2009, 06/11/2010   Influenza, High Dose Seasonal PF 03/10/2017   Influenza,inj,Quad PF,6+ Mos 03/08/2013, 02/23/2014, 02/15/2015, 04/05/2016   Influenza-Unspecified 03/24/2017, 02/16/2019   PFIZER Comirnaty(Gray Top)Covid-19 Tri-Sucrose Vaccine 12/27/2020   PFIZER(Purple Top)SARS-COV-2 Vaccination 07/19/2019, 08/09/2019, 04/26/2020   Pfizer Covid-19 Vaccine Bivalent Booster 37yrs & up 06/04/2021   Pneumococcal Conjugate-13 10/26/2013   Pneumococcal Polysaccharide-23 03/27/2004, 07/02/2012, 03/05/2018   RSV,unspecified 04/19/2022   Td 11/24/2001   Tdap 10/26/2013   Zoster Recombinat (Shingrix) 12/28/2018     TDAP status: Up to date  Pneumococcal vaccine status: Up to date  Covid-19 vaccine status: Information provided on how to obtain vaccines.   Qualifies for Shingles Vaccine? Yes   Zostavax completed No   Shingrix Completed?: No.    Education has been provided regarding the importance of this vaccine. Patient has been advised to call insurance company to determine out of pocket expense if they have not yet received this vaccine. Advised may also receive vaccine at local pharmacy or Health Dept. Verbalized acceptance and understanding.  Screening Tests Health Maintenance  Topic Date Due   Zoster Vaccines- Shingrix (2 of 2) 02/22/2019   Medicare Annual Wellness (  AWV)  10/12/2019   COLON CANCER SCREENING ANNUAL FOBT  10/11/2020   COVID-19 Vaccine (7 - 2023-24 season) 05/23/2022   INFLUENZA VACCINE  12/26/2022   HEMOGLOBIN A1C  02/22/2023   Diabetic kidney evaluation - Urine ACR  09/06/2023   OPHTHALMOLOGY EXAM  09/11/2023   Diabetic kidney evaluation - eGFR measurement  10/11/2023   FOOT EXAM  10/11/2023   DTaP/Tdap/Td (3 - Td or Tdap) 10/27/2023   Colonoscopy  02/07/2029   Pneumonia Vaccine 66+ Years old  Completed   Hepatitis C Screening  Completed   HPV VACCINES  Aged Out    Health Maintenance  Health Maintenance Due  Topic Date Due   Zoster Vaccines- Shingrix (2 of 2) 02/22/2019   Medicare Annual Wellness (AWV)  10/12/2019   COLON CANCER SCREENING ANNUAL FOBT  10/11/2020   COVID-19 Vaccine (7 - 2023-24 season) 05/23/2022    Colorectal cancer screening: Type of screening: Colonoscopy. Completed 02/08/19. Repeat every 10 years  Lung Cancer Screening: (Low Dose CT Chest recommended if Age 50-80 years, 30 pack-year currently smoking OR have quit w/in 15years.) does not qualify.   Lung Cancer Screening Referral: n/a  Additional Screening:  Hepatitis C Screening: does qualify; Completed 02/12/17  Vision Screening: Recommended annual ophthalmology exams for early  detection of glaucoma and other disorders of the eye. Is the patient up to date with their annual eye exam?  Yes  Who is the provider or what is the name of the office in which the patient attends annual eye exams? [facility] If pt is not established with a provider, would they like to be referred to a provider to establish care? No .   Dental Screening: Recommended annual dental exams for proper oral hygiene  Community Resource Referral / Chronic Care Management: CRR required this visit?  No   CCM required this visit?   Will check with pharmacist for availability of resources for cost of Eliquis      Plan:     I have personally reviewed and noted the following in the patient's chart:   Medical and social history Use of alcohol, tobacco or illicit drugs  Current medications and supplements including opioid prescriptions. Patient is not currently taking opioid prescriptions. Functional ability and status Nutritional status Physical activity Advanced directives List of other physicians Hospitalizations, surgeries, and ER visits in previous 12 months Vitals Screenings to include cognitive, depression, and falls Referrals and appointments  In addition, I have reviewed and discussed with patient certain preventive protocols, quality metrics, and best practice recommendations. A written personalized care plan for preventive services as well as general preventive health recommendations were provided to patient.     Durwin Nora, [place]   1/61/0960   Due to this being a virtual visit, the after visit summary with patients personalized plan was offered to patient via mail or my-chart. per request, patient was mailed a copy of AVS  Nurse Notes: See telephone note

## 2022-10-18 ENCOUNTER — Ambulatory Visit (INDEPENDENT_AMBULATORY_CARE_PROVIDER_SITE_OTHER): Payer: PPO

## 2022-10-18 ENCOUNTER — Other Ambulatory Visit: Payer: PPO

## 2022-10-18 VITALS — Ht 71.0 in | Wt 216.0 lb

## 2022-10-18 DIAGNOSIS — Z0000 Encounter for general adult medical examination without abnormal findings: Secondary | ICD-10-CM

## 2022-10-18 DIAGNOSIS — N184 Chronic kidney disease, stage 4 (severe): Secondary | ICD-10-CM | POA: Diagnosis not present

## 2022-10-18 MED ORDER — ENOXAPARIN SODIUM 150 MG/ML IJ SOSY: PREFILLED_SYRINGE | INTRAMUSCULAR | 0 refills | Status: DC

## 2022-10-18 NOTE — Telephone Encounter (Signed)
Pt called. Said [facility] had the [employer]. States it will be 70. Gave him info for good rx coupon  BIN [id number] PCN [id number] Member ID [id number]

## 2022-10-18 NOTE — Addendum Note (Signed)
Addended by: Malena Peer D on: 10/18/2022 02:34 PM   Modules accepted: Orders

## 2022-10-18 NOTE — Telephone Encounter (Signed)
Tried to call patient again to review bridge. He will need to start tomorrow. Called pt and LVM for him to call back. Rx sent to pharmacy.  Spoke with patient and gave him the instructions. He will take aound 10-11PM (bedtime) since his procedure is in the afternoon.

## 2022-10-19 LAB — BASIC METABOLIC PANEL WITH GFR
BUN/Creatinine Ratio: 10 (ref 10–24)
BUN: 23 mg/dL (ref 8–27)
CO2: 26 mmol/L (ref 20–29)
Calcium: 9.2 mg/dL (ref 8.6–10.2)
Chloride: 101 mmol/L (ref 96–106)
Creatinine, Ser: 2.3 mg/dL — ABNORMAL HIGH (ref 0.76–1.27)
Glucose: 283 mg/dL — ABNORMAL HIGH (ref 70–99)
Potassium: 4.2 mmol/L (ref 3.5–5.2)
Sodium: 141 mmol/L (ref 134–144)
eGFR: 30 mL/min/1.73 — ABNORMAL LOW

## 2022-10-22 ENCOUNTER — Encounter: Payer: Self-pay | Admitting: Podiatry

## 2022-10-22 ENCOUNTER — Ambulatory Visit (INDEPENDENT_AMBULATORY_CARE_PROVIDER_SITE_OTHER): Payer: PPO | Admitting: Podiatry

## 2022-10-22 DIAGNOSIS — M79609 Pain in unspecified limb: Secondary | ICD-10-CM

## 2022-10-22 DIAGNOSIS — E119 Type 2 diabetes mellitus without complications: Secondary | ICD-10-CM | POA: Diagnosis not present

## 2022-10-22 DIAGNOSIS — B351 Tinea unguium: Secondary | ICD-10-CM | POA: Diagnosis not present

## 2022-10-22 NOTE — Progress Notes (Signed)
This patient returns to my office for at risk foot care.  This patient requires this care by a professional since this patient will be at risk due to having venous insufficiency, chronic kidney disease stage IV diabetes and coagulation defect.  Patient is taking eliquiss.  This patient is unable to cut nails himself since the patient cannot reach his nails.These nails are painful walking and wearing shoes.  This patient presents for at risk foot care today.  General Appearance  Alert, conversant and in no acute stress.  Vascular  Dorsalis pedis and posterior tibial  pulses are palpable  bilaterally.  Capillary return is within normal limits  bilaterally. Temperature is within normal limits  bilaterally.  Neurologic  Senn-Weinstein monofilament wire test within normal limits  bilaterally. Muscle power within normal limits bilaterally.  Nails Thick disfigured discolored nails with subungual debris  from hallux to fifth toes bilaterally. No evidence of bacterial infection or drainage bilaterally.  Orthopedic  No limitations of motion  feet .  No crepitus or effusions noted.  No bony pathology or digital deformities noted.  Amputation second toe left foot.  Skin  normotropic skin with no porokeratosis noted bilaterally.  No signs of infections or ulcers noted.  Porokeratosis sub 4 left and sub 5th right asymptomatic.  Onychomycosis  Pain in right toes  Pain in left toes  Consent was obtained for treatment procedures.   Mechanical debridement of nails 1-5  Right and 1,3-5 left foot. performed with a nail nipper.  Filed with dremel without incident.    Return office visit   3 months                 Told patient to return for periodic foot care and evaluation due to potential at risk complications.   Helane Gunther DPM

## 2022-10-23 ENCOUNTER — Telehealth: Payer: Self-pay | Admitting: Family Medicine

## 2022-10-23 DIAGNOSIS — Z86010 Personal history of colonic polyps: Secondary | ICD-10-CM

## 2022-10-23 NOTE — Telephone Encounter (Signed)
Called patient and confirmed I was speaking with Anthony [name].   Discussed Creatinine is stable from last time, baseline from 2.1-2.7 over past year. Leg swelling he notes is improved, and he denies chest pain or shortness of breath. Notes he is still taking lasix once a daily. Discussed to let me know if legs had increased swelling again.   Discussed overdue for repeat colonoscopy. Previous was with Dr Russella Dar in Sept 2020 with multiple polyps and fragments of tubular adenoma, thus repeat recommended in 3 years and discussed that he is overdue. Gave patient name and number for [facility] to call and schedule colonoscopy to check for further polyps, discussed risk of progression to colon cancer and he states he will call. I also placed referral since over 3 years in case needed.   Burley Saver MD

## 2022-11-05 ENCOUNTER — Telehealth: Payer: Self-pay | Admitting: Cardiology

## 2022-11-05 NOTE — Telephone Encounter (Signed)
Pt c/o medication issue:  1. Name of Medication: carvedilol (COREG) 25 MG tablet   2. How are you currently taking this medication (dosage and times per day)? TAKE 1 TABLET 2 TIMES DAILY.   3. Are you having a reaction (difficulty breathing--STAT)? No  4. What is your medication issue? Pt would like to know if he is to continue current instructions or does provider want him to go back to taking 1 tablet once daily. Please Advise

## 2022-11-05 NOTE — Telephone Encounter (Signed)
Spoke with patient and he ws calling to see if he needed to continue taking carvedilol 2 tablets BID a day or go back to 2 tablets BID.  He stated provider told him to increase the dose last visit.  Reviewing your note her was informed to continue carveilol 25mg  1 tablet BID.   Informed patient he is to take 1 tablet twice a day. He verbalized understanding and repeated instructions back to me and read the instructions on prescription to me.  I asked how was his blood pressure and heart being that he was taking 100 mg daily 50mg  in the am and 50mg  in the evening. He stated that his blood pressure has still been high with systolic being over 140 and diastolic sometimes over 90. He stated heart rate was normal but couldn't give any readings. He states he feels fine.

## 2022-11-11 ENCOUNTER — Telehealth: Payer: Self-pay | Admitting: Pharmacist

## 2022-11-11 NOTE — Telephone Encounter (Signed)
Reviewed and agree with Dr Koval's plan.   

## 2022-11-11 NOTE — Telephone Encounter (Signed)
Patient called requesting samples of Eliquis for financial hardship.   Returned call and helped with provision of 28 day supply of Eliquis 5mg  BID for A-fibrillation. Patient denies any recent issues with bleeding or bruising.   Patient plans to pick up the sample supply later today.   Medication Samples have been provided for patient. Placed at front desk.   Drug name: Eliquis (rivaroxaban) 5mg         Qty: 56 tablets  LOT: [id number]  Exp.Date: 11/24/2023  Dosing instructions: 1 tablet BID  The patient has been instructed regarding the correct time, dose, and frequency of taking this medication, including desired effects and most common side effects.   Madelon Lips 10:09 AM 11/11/2022

## 2022-11-15 ENCOUNTER — Encounter: Payer: Self-pay | Admitting: Pharmacist

## 2022-11-15 ENCOUNTER — Ambulatory Visit (INDEPENDENT_AMBULATORY_CARE_PROVIDER_SITE_OTHER): Payer: PPO | Admitting: Pharmacist

## 2022-11-15 VITALS — BP 150/93 | HR 70 | Wt 224.6 lb

## 2022-11-15 DIAGNOSIS — I10 Essential (primary) hypertension: Secondary | ICD-10-CM | POA: Diagnosis not present

## 2022-11-15 DIAGNOSIS — Z794 Long term (current) use of insulin: Secondary | ICD-10-CM

## 2022-11-15 DIAGNOSIS — E119 Type 2 diabetes mellitus without complications: Secondary | ICD-10-CM | POA: Diagnosis not present

## 2022-11-15 MED ORDER — LOSARTAN POTASSIUM-HCTZ 50-12.5 MG PO TABS
1.0000 | ORAL_TABLET | Freq: Every day | ORAL | 3 refills | Status: DC
Start: 2022-11-15 — End: 2023-09-15

## 2022-11-15 MED ORDER — NOVOLOG FLEXPEN 100 UNIT/ML ~~LOC~~ SOPN
PEN_INJECTOR | SUBCUTANEOUS | 3 refills | Status: DC
Start: 2022-11-15 — End: 2023-04-22

## 2022-11-15 NOTE — Patient Instructions (Addendum)
It was nice to see you today!  Your goal blood sugar is 80-130 before eating and less than 180 after eating.  Medication Changes: Continue Tresiba 26 units once daily  Adjust Novolog to: - Breakfast 8-10 units - Lunch 8-10 units - Dinner: 15-20 units  Continue hydralazine 50 mg three times daily and carvedilol 25 mg twice daily  Discontinue losartan   Begin losartan/hydrochlorothiazide 50/12.5 mg once daily  Monitor blood sugars at home and keep a log (glucometer or piece of paper) to bring with you to your next visit.  Keep up the good work with diet and exercise. Aim for a diet full of vegetables, fruit and lean meats (chicken, [place], fish). Try to limit salt intake by eating fresh or frozen vegetables (instead of canned), rinse canned vegetables prior to cooking and do not add any additional salt to meals.

## 2022-11-15 NOTE — Assessment & Plan Note (Signed)
Hypertension longstanding currently uncontrolled with lower extremity swelling despite use of daily furosemide. Blood pressure goal of <130/80 mmHg. Medication adherence appropriate -Continued hydralazine 50 mg TID and carvedilol 25 mg BID. -Discontinue losartan 25mg  -Initiate losartan/hydrochlorothiazide 50/12.5 mg daily -Continue furosemide ONCE daily Anticipate BMET at upcoming Cardiology appointment.

## 2022-11-15 NOTE — Progress Notes (Signed)
S:     Chief Complaint  Patient presents with   Medication Management    Diabetes and Hypertension Management    72 y.o. male who presents for diabetes evaluation, education, and management.  PMH is significant for T2DM, HTN, CHF and chronic back pain.  Patient was referred and last seen by Primary Care Provider, Dr. Miquel Dunn, on 10/11/22.   At last visit with pharmacy, Evaristo Bury was decreased to 26 units daily to prevent overnight hypoglycemia.   Today, patient arrives in good spirits and presents with any assistance of a cane. He reports dinner is his largest meal of the day around 5pm. Daytime hypoglycemia is seen on his Libreview report with some overcorrection reported by the patient. He reports taking 20 units at dinner for meals with minimal carbohydrates.  Patient reports Diabetes was diagnosed in 2008.   Current diabetes medications include: Tresiba (insulin degludec) 26 unit daily, Novolog (insulin aspart) 7-8 units with lunch and 8-10 units with dinner (taking 15-18 with lunch and 20 with dinner) . Current hypertension medications include: carvedilol 25 mg BID, hydralazine 50 mg TID, losartan 25 mg daily Current hyperlipidemia medications include: Rosuvastatin 20 mg  Patient reports adherence to taking all medications as prescribed.   Insurance coverage: [employer] and BCBS Secondary  Patient reports hypoglycemic events.  Patient denies nocturia (nighttime urination).  Patient denies neuropathy (nerve pain). Patient denies visual changes. Patient reports self foot exams.   Patient reported dietary habits:  Breakfast: cereal or cream of wheat  O:   Review of Systems  All other systems reviewed and are negative.   Physical Exam Vitals reviewed.  Constitutional:      Appearance: Normal appearance.  Pulmonary:     Effort: Pulmonary effort is normal.  Musculoskeletal:     Right lower leg: Edema present.     Left lower leg: Edema present.     Comments:  1+ left, 2+ on right  Neurological:     Mental Status: He is alert.  Psychiatric:        Mood and Affect: Mood normal.        Behavior: Behavior normal.        Thought Content: Thought content normal.     CGM Download:  % Time CGM is active: 91% Average Glucose: 272 mg/dL Glucose Management Indicator: 9.8  Glucose Variability: 41.7 (goal <36%) Time in Goal:  - Time in range 70-180: 19% - Time above range: 78% - Time below range: 3% Observed patterns: consistent hyperglycemia with hypoglycemia likely attributed to inadequate carb counting.    Lab Results  Component Value Date   HGBA1C 10.2 (H) 08/22/2022   Vitals:   11/15/22 1018 11/15/22 1020  BP: (!) 157/86 (!) 150/93  Pulse: 70   SpO2: 100%     Lipid Panel     Component Value Date/Time   CHOL 146 08/22/2022 1025   TRIG 91 08/22/2022 1025   HDL 59 08/22/2022 1025   CHOLHDL 2.5 08/22/2022 1025   CHOLHDL 3.2 02/28/2016 0906   VLDL 19 02/28/2016 0906   LDLCALC 70 08/22/2022 1025   LDLDIRECT 58 11/06/2011 1434    Clinical Atherosclerotic Cardiovascular Disease (ASCVD): Yes  The ASCVD Risk score (Arnett DK, et al., 2019) failed to calculate for the following reasons:   The patient has a prior MI or stroke diagnosis   A/P: Diabetes longstanding currently uncontrolled based on LibreView report. Patient is able to verbalize appropriate hypoglycemia management plan. Medication adherence appears appropriate, but he  is unable to gauge how much insulin is necessary pending carbohydrate intake. It appears he will need mealtime coverage for breakfast. Control is suboptimal due to inadequate insulin dosing and overcorrection of hypoglycemia. -Continued basal insulin Tresiba (insulin degludec) 26 units daily -Adjusted dose of rapid insulin Novolog (insulin lispro)  - Breakfast 8-10 units - Lunch 8-10 units - Dinner: 15-20 units -Patient educated on purpose, proper use, and potential adverse effects of medications.   -Extensively discussed pathophysiology of diabetes, recommended lifestyle interventions, dietary effects on blood sugar control.  -Counseled on s/sx of and management of hypoglycemia.  -Next A1c anticipated at next visit.   ASCVD risk - secondary prevention in patient with diabetes. Last LDL is 70 mg/dL not at goal of <16 mg/dL. -Continued Rosuvastatin 20 mg.   Hypertension longstanding currently uncontrolled with lower extremity swelling despite use of daily furosemide. Blood pressure goal of <130/80 mmHg. Medication adherence appropriate -Continued hydralazine 50 mg TID and carvedilol 25 mg BID. -Discontinue losartan 25mg  -Initiate losartan/hydrochlorothiazide 50/12.5 mg daily -Continue furosemide ONCE daily Anticipate BMET at upcoming Cardiology appointment.   Written patient instructions provided. Patient verbalized understanding of treatment plan.  Total time in face to face counseling 37 minutes.    Follow-up:  Pharmacist 12/24/22. PCP clinic visit in 10/24/22.  Patient seen with Lily Peer, PharmD, PGY-1 resident.

## 2022-11-15 NOTE — Assessment & Plan Note (Signed)
Diabetes longstanding currently uncontrolled based on LibreView report. Patient is able to verbalize appropriate hypoglycemia management plan. Medication adherence appears appropriate, but he is unable to gauge how much insulin is necessary pending carbohydrate intake. It appears he will need mealtime coverage for breakfast. Control is suboptimal due to inadequate insulin dosing and overcorrection of hypoglycemia. -Continued basal insulin Tresiba (insulin degludec) 26 units daily -Adjusted dose of rapid insulin Novolog (insulin lispro)  - Breakfast 8-10 units - Lunch 8-10 units - Dinner: 15-20 units -Patient educated on purpose, proper use, and potential adverse effects of medications.  -Extensively discussed pathophysiology of diabetes, recommended lifestyle interventions, dietary effects on blood sugar control.  -Counseled on s/sx of and management of hypoglycemia.

## 2022-11-18 DIAGNOSIS — I129 Hypertensive chronic kidney disease with stage 1 through stage 4 chronic kidney disease, or unspecified chronic kidney disease: Secondary | ICD-10-CM | POA: Diagnosis not present

## 2022-11-18 DIAGNOSIS — Z95810 Presence of automatic (implantable) cardiac defibrillator: Secondary | ICD-10-CM | POA: Diagnosis not present

## 2022-11-18 DIAGNOSIS — E785 Hyperlipidemia, unspecified: Secondary | ICD-10-CM | POA: Diagnosis not present

## 2022-11-18 DIAGNOSIS — I428 Other cardiomyopathies: Secondary | ICD-10-CM | POA: Diagnosis not present

## 2022-11-18 DIAGNOSIS — N189 Chronic kidney disease, unspecified: Secondary | ICD-10-CM | POA: Diagnosis not present

## 2022-11-18 DIAGNOSIS — E1122 Type 2 diabetes mellitus with diabetic chronic kidney disease: Secondary | ICD-10-CM | POA: Diagnosis not present

## 2022-11-18 DIAGNOSIS — N1832 Chronic kidney disease, stage 3b: Secondary | ICD-10-CM | POA: Diagnosis not present

## 2022-11-18 DIAGNOSIS — D631 Anemia in chronic kidney disease: Secondary | ICD-10-CM | POA: Diagnosis not present

## 2022-11-18 DIAGNOSIS — N2581 Secondary hyperparathyroidism of renal origin: Secondary | ICD-10-CM | POA: Diagnosis not present

## 2022-11-18 NOTE — Progress Notes (Signed)
Reviewed and agree with Dr Koval's plan.   

## 2022-11-23 DIAGNOSIS — E119 Type 2 diabetes mellitus without complications: Secondary | ICD-10-CM | POA: Diagnosis not present

## 2022-11-26 LAB — CUP PACEART REMOTE DEVICE CHECK
Battery Remaining Longevity: 78 mo
Battery Remaining Percentage: 88 %
Brady Statistic RA Percent Paced: 0 %
Brady Statistic RV Percent Paced: 99 %
Date Time Interrogation Session: 20240628031400
HighPow Impedance: 60 Ohm
Implantable Lead Connection Status: 753985
Implantable Lead Connection Status: 753985
Implantable Lead Implant Date: 20190301
Implantable Lead Implant Date: 20190301
Implantable Lead Location: 753858
Implantable Lead Location: 753860
Implantable Pulse Generator Implant Date: 20231222
Lead Channel Impedance Value: 538 Ohm
Lead Channel Impedance Value: 644 Ohm
Lead Channel Pacing Threshold Amplitude: 0.5 V
Lead Channel Pacing Threshold Pulse Width: 0.4 ms
Lead Channel Setting Pacing Amplitude: 2 V
Lead Channel Setting Pacing Amplitude: 4 V
Lead Channel Setting Pacing Pulse Width: 0.4 ms
Lead Channel Setting Pacing Pulse Width: 1.5 ms
Lead Channel Setting Sensing Sensitivity: 0.6 mV
Lead Channel Setting Sensing Sensitivity: 1 mV
Pulse Gen Serial Number: 285896

## 2022-11-29 ENCOUNTER — Ambulatory Visit (INDEPENDENT_AMBULATORY_CARE_PROVIDER_SITE_OTHER): Payer: PPO

## 2022-11-29 DIAGNOSIS — I428 Other cardiomyopathies: Secondary | ICD-10-CM

## 2022-12-03 ENCOUNTER — Other Ambulatory Visit: Payer: Self-pay

## 2022-12-03 ENCOUNTER — Telehealth: Payer: Self-pay

## 2022-12-03 DIAGNOSIS — Z794 Long term (current) use of insulin: Secondary | ICD-10-CM

## 2022-12-03 MED ORDER — FREESTYLE LIBRE 14 DAY SENSOR MISC
1.0000 | 11 refills | Status: DC
Start: 2022-12-03 — End: 2022-12-24

## 2022-12-03 NOTE — Telephone Encounter (Signed)
Patient calls nurse line regarding two reasons.   Requesting CGM sensors. Advised that Dr. Linwood Dibbles has sent [facility] to [facility].  He is requesting to increase Furosemide to 2 tablets daily due to increased swelling in lower extremities. He denies shortness of breath. Advised patient that cardiologist manages this medication. Provided with contact number to call cardiologist to discuss dosage change further.   Patient appreciative.   Veronda Prude, RN

## 2022-12-13 NOTE — Progress Notes (Signed)
Remote ICD transmission.   

## 2022-12-17 DIAGNOSIS — I10 Essential (primary) hypertension: Secondary | ICD-10-CM | POA: Diagnosis not present

## 2022-12-17 DIAGNOSIS — Z1331 Encounter for screening for depression: Secondary | ICD-10-CM | POA: Diagnosis not present

## 2022-12-17 DIAGNOSIS — Z89422 Acquired absence of other left toe(s): Secondary | ICD-10-CM | POA: Diagnosis not present

## 2022-12-24 ENCOUNTER — Encounter: Payer: Self-pay | Admitting: Pharmacist

## 2022-12-24 ENCOUNTER — Ambulatory Visit (INDEPENDENT_AMBULATORY_CARE_PROVIDER_SITE_OTHER): Payer: PPO | Admitting: Pharmacist

## 2022-12-24 VITALS — BP 114/61 | HR 70 | Wt 220.4 lb

## 2022-12-24 DIAGNOSIS — Z794 Long term (current) use of insulin: Secondary | ICD-10-CM | POA: Diagnosis not present

## 2022-12-24 DIAGNOSIS — E119 Type 2 diabetes mellitus without complications: Secondary | ICD-10-CM | POA: Diagnosis not present

## 2022-12-24 DIAGNOSIS — I10 Essential (primary) hypertension: Secondary | ICD-10-CM | POA: Diagnosis not present

## 2022-12-24 DIAGNOSIS — I4821 Permanent atrial fibrillation: Secondary | ICD-10-CM

## 2022-12-24 MED ORDER — FREESTYLE LIBRE 3 SENSOR MISC: 11 refills | Status: DC

## 2022-12-24 MED ORDER — FREESTYLE LIBRE 3 READER DEVI: 1.0000 | Freq: Once | 0 refills | Status: AC

## 2022-12-24 MED ORDER — FREESTYLE LIBRE 3 READER DEVI: Status: DC

## 2022-12-24 MED ORDER — APIXABAN 5 MG PO TABS
5.0000 mg | ORAL_TABLET | Freq: Two times a day (BID) | ORAL | Status: DC
Start: 2022-12-24 — End: 2023-04-11

## 2022-12-24 NOTE — Patient Instructions (Addendum)
It was great to see you today!   Continue Evaristo Bury (long acting insulin) 30 units once daily in the morning. Continue Novolog (meal times insulin) 8 units with breakfast and lunch, 20 units with dinner.   Do your best to increase your activity level and pay attention to your portions of carbohydrate heavy foods (bread, pasta, rice, potatoes).  Continue taking all other medications as prescribed. We will be sending a prescription for the new blood sugar sensor (Freestyle Libre 3) to [facility].  Sensor Application Apply Sensors only on the back of your upper arm. If placed in other areas, the Sensor may not function properly and could give you inaccurate readings. Avoid areas with scars, moles, stretch marks, or lumps.   Select an area of skin that generally stays flat during your normal daily activities (no bending or folding). Choose a site that is at least 1 inch (2.5 cm) away from any injection sites. To prevent discomfort or skin irritation, you should select a different site other than the one most recently used. Wash application site using a plain soap, dry, and then clean with an alcohol wipe. This will help remove any oily residue that may prevent the sensor from sticking properly. Allow site to air dry before proceeding. Note: The area MUST be clean and dry, or the Sensor may not stay on for the full wear duration specified by your Sensor insert. 4. Unscrew the cap from the Sensor Applicator and set the cap aside.  5. Place the Sensor Applicator over the prepared site and push down firmly to apply the Sensor to your body. 6. Gently pull the Sensor Applicator away from your body. The Sensor should now be attached to your skin. 7. Make sure the Sensor is secure after application. Put the cap back on the Sensor Applicator. Discard the used Engineer, agricultural according to local regulations.  What If My Sensor Falls Off or What If My Sensor Isn't Working? Call [employer] Customer Care Team at  [phone] Available 7 days a week from 8AM-8PM EST, excluding holidays If yo have multiple sensors fall off prior to 14 days of use, contact [facility] at [phone]

## 2022-12-24 NOTE — Assessment & Plan Note (Signed)
Hypertension longstanding currently uncontrolled with lower extremity swelling despite use of daily furosemide. Blood pressure goal of <130/80 mmHg. Medication adherence appropriate -Continued hydralazine 50 mg TID and carvedilol 25 mg BID. -Continued losartan/hydrochlorothiazide 50/12.5 mg daily -Continue furosemide ONCE daily

## 2022-12-24 NOTE — Progress Notes (Signed)
S:     Chief Complaint  Patient presents with   Medication Management    Diabetes - Hypertension   73 y.o. male who presents for diabetes evaluation, education, and management.  PMH is significant for T2DM, HTN, CHF and chronic back pain.  Patient was referred and last seen by Primary Care Provider, Dr. Miquel Dunn, on 10/11/22.    At last visit with pharmacy, Evaristo Bury was decreased to 26 units daily to prevent overnight hypoglycemia.   Today, patient arrives in good spirits and presents with any assistance of a cane. He reports dinner is his largest meal of the day around 5pm. Daytime hypoglycemia is seen on his Libreview report with some overcorrection reported by the patient. He reports taking 20 units at dinner for meals with minimal carbohydrates. Reports hypoglycemia symptoms after eating a salad ~3 weeks ago. Felt fatigued. Checked sugar and determined low of 44 mg/dL. Had 7 grapes, drank about 1/2 cup of regular sprite. Glucose came up in ~30 minutes.   Patient reports Diabetes was diagnosed in 2008.   Current diabetes medications include: Tresiba (insulin degludec) 26 unit daily, Novolog (insulin aspart) 8-10 units with breakfast and  lunch and 15-20 with dinner).  Reports missed Tresiba twice in the past 2 weeks. Usually skips dose for the whole day.  Current hypertension medications include: carvedilol 25 mg BID, hydralazine 50 mg TID, losartan 25 mg daily.   Current hyperlipidemia medications include: Rosuvastatin 20 mg  Patient reports adherence to taking all medications as prescribed.   Insurance coverage: [employer] and BCBS Secondary  Patient reports hypoglycemic events.  Patient denies nocturia (nighttime urination).  Patient denies neuropathy (nerve pain). Patient denies visual changes. Patient reports self foot exams.   Patient reported dietary habits:  Breakfast: cereal or cream of wheat  O:   Review of Systems  All other systems reviewed and are  negative.   Physical Exam Vitals reviewed.  Constitutional:      Appearance: Normal appearance.  Pulmonary:     Effort: Pulmonary effort is normal.  Musculoskeletal:     Comments: 1+ left, 2+ on right  Neurological:     Mental Status: He is alert.  Psychiatric:        Mood and Affect: Mood normal.        Behavior: Behavior normal.        Thought Content: Thought content normal.     CGM Download:  % Time CGM is active: 95% Average Glucose: 219 mg/dL Glucose Management Indicator: 8.5 Glucose Variability: 37.2% (goal <36%) Time in Goal:  - Time in range 70-180: 32% - Time above range: 67% - Time below range: 1% Observed patterns: consistent hyperglycemia with hypoglycemia likely attributed to inadequate carb counting.    Lab Results  Component Value Date   HGBA1C 10.2 (H) 08/22/2022   Vitals:   12/24/22 1346  BP: 114/61  Pulse: 70  SpO2: 100%     Lipid Panel     Component Value Date/Time   CHOL 146 08/22/2022 1025   TRIG 91 08/22/2022 1025   HDL 59 08/22/2022 1025   CHOLHDL 2.5 08/22/2022 1025   CHOLHDL 3.2 02/28/2016 0906   VLDL 19 02/28/2016 0906   LDLCALC 70 08/22/2022 1025   LDLDIRECT 58 11/06/2011 1434    Clinical Atherosclerotic Cardiovascular Disease (ASCVD): Yes  The ASCVD Risk score (Arnett DK, et al., 2019) failed to calculate for the following reasons:   The patient has a prior MI or stroke diagnosis  A/P: Diabetes longstanding currently slightly improved control based on LibreView report in the last month. Patient is able to verbalize appropriate hypoglycemia management plan. Medication adherence appears appropriate most days but admits to missing long-acting insulin ~ once per week.  Breakfast insulin coverage appears to have improved control. Control remains suboptimal due to sedentary lifestyle, back pain and dietary indiscretion of meal portions.  -Continued basal insulin Tresiba (insulin degludec) 30 units daily  -Continued rapid insulin  Novolog (insulin lispro)  - Breakfast 8-10 units - Lunch 8-10 units - Dinner: 15-20 units -Patient educated on purpose, proper use, and potential adverse effects of medications.  -Extensively discussed pathophysiology of diabetes, recommended dietary and lifestyle interventions.  -Counseled on s/sx of and management of hypoglycemia.  -Next A1c anticipated at next visit.   ASCVD risk - secondary prevention in patient with diabetes. Last LDL is 70 mg/dL not at goal of <60 mg/dL. -Continued Rosuvastatin 20 mg.   Hypertension longstanding currently uncontrolled with lower extremity swelling despite use of daily furosemide. Blood pressure goal of <130/80 mmHg. Medication adherence appropriate -Continued hydralazine 50 mg TID and carvedilol 25 mg BID. -Continued losartan/hydrochlorothiazide 50/12.5 mg daily -Continue furosemide ONCE daily  Afib - stable - requested samples of Eliquis (apixaban) due to financial challenges lately. Provided 1 month Eliquis samples  Patient agreed to take twice daily AND to contact us if in need in the future.   Written patient instructions provided. Patient verbalized understanding of treatment plan.  Total time in face to face counseling 29 minutes.    Follow-up:  Pharmacist 2-3 months per PCP PCP clinic visit in 1 month to meet Dr. Linwood Dibbles, new PCP Patient seen with Nils Pyle, PharmD, PGY-1 Pharmacy Resident.

## 2022-12-24 NOTE — Assessment & Plan Note (Signed)
Diabetes longstanding currently slightly improved control based on LibreView report in the last month. Patient is able to verbalize appropriate hypoglycemia management plan. Medication adherence appears appropriate most days but admits to missing long-acting insulin ~ once per week.  Breakfast insulin coverage appears to have improved control. Control remains suboptimal due to sedentary lifestyle, back pain and dietary indiscretion of meal portions.  -Continued basal insulin Tresiba (insulin degludec) 30 units daily  -Continued rapid insulin Novolog (insulin lispro)  - Breakfast 8-10 units - Lunch 8-10 units - Dinner: 15-20 units -Patient educated on purpose, proper use, and potential adverse effects of medications.  -Extensively discussed pathophysiology of diabetes, recommended dietary and lifestyle interventions.  -Counseled on s/sx of and management of hypoglycemia.  -Next A1c anticipated at next visit.

## 2022-12-25 NOTE — Progress Notes (Signed)
Reviewed and agree with Dr Koval's plan.   

## 2023-01-07 ENCOUNTER — Telehealth: Payer: Self-pay

## 2023-01-07 NOTE — Telephone Encounter (Signed)
 Renal coordinator attempted to call patient on today regarding Safe Start referral. No answer from patient after multiple rings. Coordinator left confidential voicemail for patient to return call.  Will attempt to call back within 1 week.   Baruch Gouty Renal Coordinator [facility] [phone]

## 2023-01-16 ENCOUNTER — Other Ambulatory Visit: Payer: Self-pay

## 2023-01-16 MED ORDER — POTASSIUM CHLORIDE CRYS ER 20 MEQ PO TBCR: 20.0000 meq | EXTENDED_RELEASE_TABLET | Freq: Every day | ORAL | 3 refills | Status: DC

## 2023-01-22 ENCOUNTER — Ambulatory Visit: Payer: PPO | Admitting: Podiatry

## 2023-01-28 ENCOUNTER — Encounter: Payer: Self-pay | Admitting: Family Medicine

## 2023-01-28 ENCOUNTER — Ambulatory Visit (INDEPENDENT_AMBULATORY_CARE_PROVIDER_SITE_OTHER): Payer: PPO | Admitting: Family Medicine

## 2023-01-28 VITALS — BP 139/86 | HR 70 | Ht 72.0 in | Wt 229.0 lb

## 2023-01-28 DIAGNOSIS — I5022 Chronic systolic (congestive) heart failure: Secondary | ICD-10-CM

## 2023-01-28 DIAGNOSIS — Z23 Encounter for immunization: Secondary | ICD-10-CM

## 2023-01-28 DIAGNOSIS — I4821 Permanent atrial fibrillation: Secondary | ICD-10-CM | POA: Diagnosis not present

## 2023-01-28 DIAGNOSIS — G4733 Obstructive sleep apnea (adult) (pediatric): Secondary | ICD-10-CM | POA: Diagnosis not present

## 2023-01-28 DIAGNOSIS — R931 Abnormal findings on diagnostic imaging of heart and coronary circulation: Secondary | ICD-10-CM | POA: Diagnosis not present

## 2023-01-28 DIAGNOSIS — I2510 Atherosclerotic heart disease of native coronary artery without angina pectoris: Secondary | ICD-10-CM | POA: Diagnosis not present

## 2023-01-28 DIAGNOSIS — E119 Type 2 diabetes mellitus without complications: Secondary | ICD-10-CM | POA: Diagnosis not present

## 2023-01-28 DIAGNOSIS — N184 Chronic kidney disease, stage 4 (severe): Secondary | ICD-10-CM | POA: Diagnosis not present

## 2023-01-28 DIAGNOSIS — I10 Essential (primary) hypertension: Secondary | ICD-10-CM | POA: Diagnosis not present

## 2023-01-28 DIAGNOSIS — Z794 Long term (current) use of insulin: Secondary | ICD-10-CM | POA: Diagnosis not present

## 2023-01-28 DIAGNOSIS — Z95810 Presence of automatic (implantable) cardiac defibrillator: Secondary | ICD-10-CM | POA: Diagnosis not present

## 2023-01-28 DIAGNOSIS — E782 Mixed hyperlipidemia: Secondary | ICD-10-CM

## 2023-01-28 DIAGNOSIS — I5032 Chronic diastolic (congestive) heart failure: Secondary | ICD-10-CM

## 2023-01-28 DIAGNOSIS — R0609 Other forms of dyspnea: Secondary | ICD-10-CM

## 2023-01-28 DIAGNOSIS — I428 Other cardiomyopathies: Secondary | ICD-10-CM

## 2023-01-28 DIAGNOSIS — I639 Cerebral infarction, unspecified: Secondary | ICD-10-CM

## 2023-01-28 MED ORDER — FUROSEMIDE 20 MG PO TABS: 60.0000 mg | ORAL_TABLET | Freq: Every day | ORAL | 0 refills | Status: DC

## 2023-01-28 MED ORDER — CARVEDILOL 25 MG PO TABS: ORAL_TABLET | ORAL | 3 refills | Status: DC

## 2023-01-28 NOTE — Patient Instructions (Signed)
It was great to see you!  Our plans for today:  - Increase your furosemide to 60mg . I sent a new prescription to the pharmacy. - Come back in 2 weeks.   We are checking some labs today, we will release these results to your MyChart.  Take care and seek immediate care sooner if you develop any concerns.   Dr. Linwood Dibbles

## 2023-01-28 NOTE — Progress Notes (Signed)
    SUBJECTIVE:   CHIEF COMPLAINT / HPI:   Hypertension, NICM, h/o CVA, HFrEF w/ ICD, Afib: - follows with Cardiology - Medications: coreg, NTG prn, losartan-hydrochlorothiazide, crestor, lasix, hydralazine, eliquis - Compliance: good - Checking BP at home: no - Denies any SOB, CP, vision changes, LE edema, medication SEs, or symptoms of hypotension  OSA on CPAP - compliant  Diabetes, Type 2 - Last A1c 10.2 07/2022 - Medications: tresiba 30u daily, novolog 15-18u breakfast and lunch, 20u in evening - Compliance: good - Checking BG at home: yes, average 198 over the past 2 weeks.  - Eye exam: UTD - Foot exam: UTD - Microalbumin: UTD - Statin: yes - Denies symptoms of hypoglycemia   OBJECTIVE:   BP 139/86   Pulse 70   Ht 6' (1.829 m)   Wt 229 lb (103.9 kg)   SpO2 98%   BMI 31.06 kg/m   Gen: well appearing, in NAD Card: RRR Lungs: CTAB Ext: WWP, no edema   ASSESSMENT/PLAN:   HYPERTENSION, BENIGN SYSTEMIC At goal, no changes.  Atrial fibrillation, permanent (HCC) In permanent Afib per chart review though RRR today. Rate controlled. Continue eliquis, samples given today.  OSA on CPAP Compliant, continue.  Insulin dependent type 2 diabetes mellitus (HCC) Recheck A1c, adjust regimen as indicated. Per chart review was previously on trulicity? Unclear why this was stopped. Recheck BMP, if Cr can tolerate, would initiate jardiance at follow up.  Congestive heart failure, NYHA class 3, chronic, systolic (HCC) Continue to follow with cardiology. Slightly volume overloaded today. Increase lasix to 60mg , recommend compression. F/u 2 weeks.   Chronic kidney disease (CKD), stage IV (severe) (HCC) Recheck BMP. Start jardiance if Cr allows.     Caro Laroche, DO

## 2023-01-29 ENCOUNTER — Encounter: Payer: Self-pay | Admitting: Family Medicine

## 2023-01-29 LAB — BASIC METABOLIC PANEL
BUN/Creatinine Ratio: 14 (ref 10–24)
BUN: 38 mg/dL — ABNORMAL HIGH (ref 8–27)
CO2: 26 mmol/L (ref 20–29)
Calcium: 9.3 mg/dL (ref 8.6–10.2)
Chloride: 97 mmol/L (ref 96–106)
Creatinine, Ser: 2.79 mg/dL — ABNORMAL HIGH (ref 0.76–1.27)
Glucose: 167 mg/dL — ABNORMAL HIGH (ref 70–99)
Potassium: 3.5 mmol/L (ref 3.5–5.2)
Sodium: 139 mmol/L (ref 134–144)
eGFR: 23 mL/min/{1.73_m2} — ABNORMAL LOW (ref >59)

## 2023-01-29 LAB — HEMOGLOBIN A1C
Est. average glucose Bld gHb Est-mCnc: 217 mg/dL
Hgb A1c MFr Bld: 9.2 % — ABNORMAL HIGH (ref 4.8–5.6)

## 2023-01-29 NOTE — Assessment & Plan Note (Signed)
Compliant, continue.

## 2023-01-29 NOTE — Assessment & Plan Note (Addendum)
In permanent Afib per chart review though RRR today. Rate controlled. Continue eliquis, samples given today.

## 2023-01-29 NOTE — Assessment & Plan Note (Signed)
Recheck BMP. Start jardiance if Cr allows.

## 2023-01-29 NOTE — Assessment & Plan Note (Addendum)
Recheck A1c, adjust regimen as indicated. Per chart review was previously on trulicity? Unclear why this was stopped. Recheck BMP, if Cr can tolerate, would initiate jardiance at follow up.

## 2023-01-29 NOTE — Assessment & Plan Note (Signed)
At goal, no changes

## 2023-01-29 NOTE — Assessment & Plan Note (Signed)
Continue to follow with cardiology. Slightly volume overloaded today. Increase lasix to 60mg , recommend compression. F/u 2 weeks.

## 2023-01-31 ENCOUNTER — Other Ambulatory Visit: Payer: Self-pay

## 2023-01-31 DIAGNOSIS — E119 Type 2 diabetes mellitus without complications: Secondary | ICD-10-CM

## 2023-02-03 ENCOUNTER — Telehealth: Payer: Self-pay | Admitting: *Deleted

## 2023-02-03 MED ORDER — PEN NEEDLES 32G X 4 MM MISC: 1.0000 | 11 refills | Status: DC

## 2023-02-03 NOTE — Telephone Encounter (Signed)
Pharmacy please advise on holding Eliquis prior to Lumbar epidural spinal injection scheduled for TBD. Thank you.

## 2023-02-03 NOTE — Telephone Encounter (Signed)
   Pre-operative Risk Assessment    Patient Name: Anthony [name]  DOB: 1951-01-01 MRN: [id number]    DATE OF LAST VISIT: 10/04/22  DR. KLEIN DATE OF NEXT VISIT: 03/11/23 DR. ROSS  Request for Surgical Clearance    Procedure:   LUMBAR EPIDURAL STEROID INJECTION  Date of Surgery:  Clearance TBD                                 Surgeon:  NOT LISTED Surgeon's Group or Practice Name:  [facility] Phone number:  [phone] ATTN: WANDA RICE Fax number:  [phone]   Type of Clearance Requested:   - Medical  - Pharmacy:  Hold Apixaban (Eliquis)     Type of Anesthesia:  None    Additional requests/questions:    Anthony [name]   02/03/2023, 12:29 PM

## 2023-02-03 NOTE — Telephone Encounter (Signed)
Patient with diagnosis of afib on Eliquis for anticoagulation.    Procedure: LUMBAR EPIDURAL STEROID INJECTION  Date of procedure: TBD   CHA2DS2-VASc Score = 7   This indicates a 11.2% annual risk of stroke. The patient's score is based upon: CHF History: 1 HTN History: 1 Diabetes History: 1 Stroke History: 2 Vascular Disease History: 1 Age Score: 1 Gender Score: 0      CrCl 30 ml/min Need updated plt  Patient previously bridged with lovenox due to his stroke hx when a 3 day hold is needed. He will need to let us know ASAP when his procedure is scheduled so we can coordinate bridge. I would also need an updated CBC and given his boarderline crcl would want a BMP as well.  **This guidance is not considered finalized until pre-operative APP has relayed final recommendations.**

## 2023-02-04 ENCOUNTER — Telehealth: Payer: Self-pay | Admitting: *Deleted

## 2023-02-04 DIAGNOSIS — I4821 Permanent atrial fibrillation: Secondary | ICD-10-CM

## 2023-02-04 DIAGNOSIS — Z01812 Encounter for preprocedural laboratory examination: Secondary | ICD-10-CM

## 2023-02-04 NOTE — Telephone Encounter (Signed)
Pt called back and he has been scheduled for lab work tomorrow BMET/CBC. Pt haas tele appt 02/13/23 @ 1:40. Med rec and consent are done. . I will place lab orders and schedule lab appt.

## 2023-02-04 NOTE — Telephone Encounter (Signed)
Left message to call back in regard to pre op appt needed and lab work needed. Pt will need BMET/CBC and then a few days after labs will need a tele pre op appt.

## 2023-02-05 ENCOUNTER — Ambulatory Visit: Payer: PPO | Attending: Internal Medicine

## 2023-02-05 DIAGNOSIS — Z01812 Encounter for preprocedural laboratory examination: Secondary | ICD-10-CM | POA: Diagnosis not present

## 2023-02-05 DIAGNOSIS — I4821 Permanent atrial fibrillation: Secondary | ICD-10-CM

## 2023-02-06 LAB — BASIC METABOLIC PANEL
BUN/Creatinine Ratio: 11 (ref 10–24)
BUN: 31 mg/dL — ABNORMAL HIGH (ref 8–27)
CO2: 25 mmol/L (ref 20–29)
Calcium: 10.1 mg/dL (ref 8.6–10.2)
Chloride: 100 mmol/L (ref 96–106)
Creatinine, Ser: 2.92 mg/dL — ABNORMAL HIGH (ref 0.76–1.27)
Glucose: 75 mg/dL (ref 70–99)
Potassium: 3.4 mmol/L — ABNORMAL LOW (ref 3.5–5.2)
Sodium: 140 mmol/L (ref 134–144)
eGFR: 22 mL/min/{1.73_m2} — ABNORMAL LOW (ref >59)

## 2023-02-06 LAB — CBC
Hematocrit: 38.4 % (ref 37.5–51.0)
Hemoglobin: 13.2 g/dL (ref 13.0–17.7)
MCH: 29.5 pg (ref 26.6–33.0)
MCHC: 34.4 g/dL (ref 31.5–35.7)
MCV: 86 fL (ref 79–97)
Platelets: 150 10*3/uL (ref 150–450)
RBC: 4.47 x10E6/uL (ref 4.14–5.80)
RDW: 13.6 % (ref 11.6–15.4)
WBC: 7.4 10*3/uL (ref 3.4–10.8)

## 2023-02-07 NOTE — Telephone Encounter (Addendum)
Patient previously had been bridged per Dr. Shari Prows due to hx of stroke if he held >48hr. Given his decline in renal function and crcl now <30, would need lower enoxaparin dosing.   The newest guidelines recommend typically against bridging for DOAC.  Due to renal function, would need to hold 3.5-4 days prior to injection. I will f/u with Dr. Graciela Husbands to see if he would like patient bridged since Dr. Shari Prows is no longer with the practice.

## 2023-02-13 ENCOUNTER — Ambulatory Visit (INDEPENDENT_AMBULATORY_CARE_PROVIDER_SITE_OTHER): Payer: PPO

## 2023-02-13 DIAGNOSIS — Z01810 Encounter for preprocedural cardiovascular examination: Secondary | ICD-10-CM

## 2023-02-13 NOTE — Telephone Encounter (Addendum)
Spoke with Dr. Tenny Craw, his new cardiologist who states ok for patient to hold shortest amount of time without bridge. He will have to hold 3.5 days for spinal procedure and kidney function and resume as soon as safely possible.   Per Dr. Tenny Craw " He went off before for a lumbar procedure without bridging I would stop the least time Resume after "

## 2023-02-13 NOTE — Progress Notes (Signed)
   Virtual Visit via Telephone Note   Patient's procedure is TBD. Given the need for Dr. Graciela Husbands to make recommendations for holding Eliquis/bridging will defer preoperative assessment to visit with Dr. Tenny Craw on 10/15 to give Dr. Graciela Husbands enough time to comment. Patient is in agreement with plan.    Anthony Levering, NP  02/13/2023, 8:54 AM

## 2023-02-14 ENCOUNTER — Ambulatory Visit: Payer: PPO | Attending: Cardiovascular Disease | Admitting: Student

## 2023-02-14 DIAGNOSIS — Z01810 Encounter for preprocedural cardiovascular examination: Secondary | ICD-10-CM

## 2023-02-14 NOTE — Progress Notes (Signed)
Virtual Visit via Telephone Note   Because of Anthony [name]'s co-morbid illnesses, he is at least at moderate risk for complications without adequate follow up.  This format is felt to be most appropriate for this patient at this time.  The patient did not have access to video technology/had technical difficulties with video requiring transitioning to audio format only (telephone).  All issues noted in this document were discussed and addressed.  No physical exam could be performed with this format.  Please refer to the patient's chart for his consent to telehealth for [facility].  Evaluation Performed:  Preoperative cardiovascular risk assessment _____________   Date:  02/14/2023   Patient ID:  Anthony [name], DOB July 14, 1950, MRN [id number] Patient Location:  Home Provider location:   Office  Primary Care Provider:  Caro Laroche, DO Primary Cardiologist:  Dietrich Pates, MD  Chief Complaint / Patient Profile   72 y.o. y/o male with a h/o coronary atheroscelrosis, chronic systolic heart failure/nonischemic cardiomyopathy s/p ICD, permanent afib on anticoagulation, hypertension, hyperlipidemia, OSA on CPAP, CVA, T2DM, CKD stage IV who is pending lumbar ESI by [facility] and presents today for telephonic preoperative cardiovascular risk assessment.  History of Present Illness    Anthony [name] is a 72 y.o. male who presents via audio/video conferencing for a telehealth visit today.  Pt was last seen in cardiology clinic on 10/04/2022 by Dr. Graciela Husbands.  At that time MAXXWELL BE was noted to be volume overloaded with dietary indiscretion felt to be contributing factor. His Lasix was increased for 5 days.  The patient is now pending procedure as outlined above. Since his last visit, he is doing well. Patient denies shortness of breath, dyspnea on exertion, lower extremity edema, orthopnea or PND. No chest pain, pressure, or tightness. No palpitations. Activity has been limited by  back pain. He is independent with all ADLs and is able to perform light to moderate household activities.   Past Medical History    Past Medical History:  Diagnosis Date   Arthritis    Benign neoplasm of descending colon    Benign neoplasm of sigmoid colon    Benign neoplasm of transverse colon    CAD in native artery    a. reported MI 2000, 2001, s/p stenting of the circumflex lesion extending into the second obtuse marginal branch with a drug-eluting stent in 2003. b. low risk nuc 2015.   Chronic atrial fibrillation (HCC)    Chronic combined systolic and diastolic CHF (congestive heart failure) (HCC)    a. Previously diastolic, then EF 25-30% in 10/2016.   CKD (chronic kidney disease), stage III (HCC)    Depression    Diabetes mellitus    Edema of extremities    Erectile dysfunction    GERD (gastroesophageal reflux disease)    Hyperlipidemia    Hypertension    Myocardial infarction (HCC) 2000&2001   Obesity    Onychomycosis    OSA (obstructive sleep apnea)    S/P ICD (internal cardiac defibrillator) procedure and BiV device,  07/25/17 Medtronic  07/26/2017   Seizures (HCC)    Sleep apnea    Stroke (HCC)    Tubular adenoma of colon 10/2002   Past Surgical History:  Procedure Laterality Date   ANGIOPLASTY  2001   stent x 1   AV NODE ABLATION N/A 08/20/2017   Procedure: AV NODE ABLATION;  Surgeon: Duke Salvia, MD;  Location: [facility] INVASIVE CV LAB;  Service: Cardiovascular;  Laterality:  N/A;   BIV ICD GENERATOR CHANGEOUT N/A 05/17/2022   Procedure: BIV ICD GENERATOR CHANGEOUT;  Surgeon: Duke Salvia, MD;  Location: [facility] INVASIVE CV LAB;  Service: Cardiovascular;  Laterality: N/A;   BIV ICD INSERTION CRT-D N/A 07/25/2017   Procedure: BIV ICD INSERTION CRT-D;  Surgeon: Duke Salvia, MD;  Location: [facility] INVASIVE CV LAB;  Service: Cardiovascular;  Laterality: N/A;   COLONOSCOPY  2000?   negative   COLONOSCOPY WITH PROPOFOL N/A 02/08/2019   Procedure: COLONOSCOPY WITH PROPOFOL;   Surgeon: Meryl Dare, MD;  Location: [facility] ENDOSCOPY;  Service: Endoscopy;  Laterality: N/A;   FOOT ARTHROTOMY Right    POLYPECTOMY  02/08/2019   Procedure: POLYPECTOMY;  Surgeon: Meryl Dare, MD;  Location: [facility] ENDOSCOPY;  Service: Endoscopy;;   TOE AMPUTATION Left 2012    Allergies  Allergies  Allergen Reactions   Enalapril Maleate Cough    Home Medications    Prior to Admission medications   Medication Sig Start Date End Date Taking? Authorizing Provider  acetaminophen (TYLENOL) 650 MG CR tablet Take 1,300 mg by mouth 2 (two) times daily as needed for pain.    [provider]  apixaban (ELIQUIS) 5 MG TABS tablet Take 1 tablet (5 mg total) by mouth 2 (two) times daily. 12/24/22   McDiarmid, Leighton Roach, MD  calcitRIOL (ROCALTROL) 0.5 MCG capsule Take 0.5 mcg by mouth daily. 09/02/22   [provider]  carvedilol (COREG) 25 MG tablet TAKE 1 TABLET 2 TIMES DAILY. 01/28/23   Caro Laroche, DO  Continuous Glucose Sensor (FREESTYLE LIBRE 3 SENSOR) MISC Place 1 sensor on the skin every 14 days. Use to check glucose continuously 12/24/22   McDiarmid, Leighton Roach, MD  diclofenac Sodium (VOLTAREN) 1 % GEL Apply 4 g topically 4 (four) times daily. 12/13/21   Moses Manners, MD  furosemide (LASIX) 20 MG tablet Take 3 tablets (60 mg total) by mouth daily. 01/28/23 04/28/23  Caro Laroche, DO  gabapentin (NEURONTIN) 300 MG capsule Take 1 capsule (300 mg total) by mouth 3 (three) times daily. 10/04/22   McDiarmid, Leighton Roach, MD  hydrALAZINE (APRESOLINE) 50 MG tablet Take 1 tablet (50 mg total) by mouth 3 (three) times daily. 08/22/22   Meriam Sprague, MD  insulin aspart (NOVOLOG FLEXPEN) 100 UNIT/ML FlexPen INJECT INTO THE SKIN THREE TIMES DAILY BEFORE MEALS. INJECT 8-10 UNITS BEFORE BREAKFAST AND LUNCH AND INJECT 15-20 UNITS BEFORE DINNER. 11/15/22   McDiarmid, Leighton Roach, MD  insulin degludec (TRESIBA FLEXTOUCH) 200 UNIT/ML FlexTouch Pen Inject 26 Units into the skin daily. 10/04/22    McDiarmid, Leighton Roach, MD  Insulin Pen Needle (PEN NEEDLES) 32G X 4 MM MISC 1 Device by Does not apply route as directed. 02/03/23   Caro Laroche, DO  Lancets [facility] ULTRASOFT) lancets Use as instructed Patient not taking: Reported on 11/15/2022 01/13/19   Melene Plan, MD  latanoprost (XALATAN) 0.005 % ophthalmic solution Place 1 drop into both eyes at bedtime. 09/03/22   [provider]  loratadine (CLARITIN) 10 MG tablet Take 10 mg by mouth daily as needed for allergies.    [provider]  losartan-hydrochlorothiazide (HYZAAR) 50-12.5 MG tablet Take 1 tablet by mouth daily. 11/15/22   McDiarmid, Leighton Roach, MD  Multiple Vitamin (MULTIVITAMIN) tablet Take 1 tablet by mouth daily.      [provider]  nitroGLYCERIN (NITROSTAT) 0.4 MG SL tablet Place 1 tablet (0.4 mg total) under the tongue every 5 (five) minutes as  needed for chest pain. If no relief by 3rd tab, call 911 Patient not taking: Reported on 11/15/2022 10/04/22   McDiarmid, Leighton Roach, MD  omeprazole (PRILOSEC) 40 MG capsule TAKE ONE CAPSULE BY MOUTH DAILY BEFORE SUPPER 02/11/22   Moses Manners, MD  Oxycodone HCl 10 MG TABS Take 10 mg by mouth 3 (three) times daily. 09/18/22   [provider]  PARoxetine (PAXIL) 30 MG tablet TAKE 1 TABLET BY MOUTH EVERY DAY 06/24/22   Moses Manners, MD  potassium chloride SA (KLOR-CON M20) 20 MEQ tablet Take 1 tablet (20 mEq total) by mouth daily. 01/16/23   Caro Laroche, DO  rosuvastatin (CRESTOR) 20 MG tablet Take 1 tablet (20 mg total) by mouth daily. 08/22/22   Meriam Sprague, MD  tamsulosin (FLOMAX) 0.4 MG CAPS capsule TAKE 1 CAPSULE BY MOUTH EVERYDAY AT BEDTIME 08/30/22   Westley Chandler, MD    Physical Exam    Vital Signs:  NASHWAN BARBA does not have vital signs available for review today.  Given telephonic nature of communication, physical exam is limited. AAOx3. NAD. Normal affect.  Speech and respirations are unlabored.  Accessory Clinical  Findings    None  Assessment & Plan    Primary Cardiologist: Dietrich Pates, MD  Preoperative cardiovascular risk assessment. Lumbar ESI by [facility].  Chart reviewed as part of pre-operative protocol coverage. According to the RCRI, patient has a 11% risk of MACE. Patient reports activity equivalent to >4.0 METS (independent with all ADLs and light to moderate household activities).   Given past medical history and time since last visit, based on ACC/AHA guidelines, AODHAN MCCALLEY would be at acceptable risk for the planned procedure without further cardiovascular testing.   Patient was advised that if he develops new symptoms prior to surgery to contact our office to arrange a follow-up appointment.  he verbalized understanding.  Per Loura Back, "Spoke with Dr. Tenny Craw, his new cardiologist who states ok for patient to hold Eliquis shortest amount of time without bridge. He will have to hold Eliquis 3.5 days for spinal procedure and kidney function and resume as soon as safely possible.   Per Dr. Tenny Craw " He went off before for a lumbar procedure without bridging I would stop the least time Resume after."   I will route this recommendation to the requesting party via Epic fax function.  Please call with questions.  Time:   Today, I have spent 6 minutes with the patient with telehealth technology discussing medical history, symptoms, and management plan.     Carlos Levering, NP  02/14/2023, 8:07 AM

## 2023-02-17 ENCOUNTER — Other Ambulatory Visit: Payer: Self-pay

## 2023-02-17 DIAGNOSIS — I5032 Chronic diastolic (congestive) heart failure: Secondary | ICD-10-CM

## 2023-02-17 DIAGNOSIS — R931 Abnormal findings on diagnostic imaging of heart and coronary circulation: Secondary | ICD-10-CM

## 2023-02-17 DIAGNOSIS — I428 Other cardiomyopathies: Secondary | ICD-10-CM

## 2023-02-17 DIAGNOSIS — I4821 Permanent atrial fibrillation: Secondary | ICD-10-CM

## 2023-02-17 DIAGNOSIS — I5022 Chronic systolic (congestive) heart failure: Secondary | ICD-10-CM

## 2023-02-17 DIAGNOSIS — I639 Cerebral infarction, unspecified: Secondary | ICD-10-CM

## 2023-02-17 DIAGNOSIS — I10 Essential (primary) hypertension: Secondary | ICD-10-CM

## 2023-02-17 DIAGNOSIS — E782 Mixed hyperlipidemia: Secondary | ICD-10-CM

## 2023-02-17 DIAGNOSIS — Z95810 Presence of automatic (implantable) cardiac defibrillator: Secondary | ICD-10-CM

## 2023-02-17 DIAGNOSIS — R0609 Other forms of dyspnea: Secondary | ICD-10-CM

## 2023-02-17 DIAGNOSIS — I2510 Atherosclerotic heart disease of native coronary artery without angina pectoris: Secondary | ICD-10-CM

## 2023-02-17 MED ORDER — HYDRALAZINE HCL 50 MG PO TABS: 50.0000 mg | ORAL_TABLET | Freq: Three times a day (TID) | ORAL | 2 refills | Status: DC

## 2023-02-18 ENCOUNTER — Other Ambulatory Visit: Payer: Self-pay

## 2023-02-18 DIAGNOSIS — I428 Other cardiomyopathies: Secondary | ICD-10-CM

## 2023-02-18 DIAGNOSIS — R931 Abnormal findings on diagnostic imaging of heart and coronary circulation: Secondary | ICD-10-CM

## 2023-02-18 DIAGNOSIS — Z95810 Presence of automatic (implantable) cardiac defibrillator: Secondary | ICD-10-CM

## 2023-02-18 DIAGNOSIS — I5022 Chronic systolic (congestive) heart failure: Secondary | ICD-10-CM

## 2023-02-18 DIAGNOSIS — I4821 Permanent atrial fibrillation: Secondary | ICD-10-CM

## 2023-02-18 DIAGNOSIS — E782 Mixed hyperlipidemia: Secondary | ICD-10-CM

## 2023-02-18 DIAGNOSIS — I639 Cerebral infarction, unspecified: Secondary | ICD-10-CM

## 2023-02-18 DIAGNOSIS — R0609 Other forms of dyspnea: Secondary | ICD-10-CM

## 2023-02-18 DIAGNOSIS — I5032 Chronic diastolic (congestive) heart failure: Secondary | ICD-10-CM

## 2023-02-18 DIAGNOSIS — I10 Essential (primary) hypertension: Secondary | ICD-10-CM

## 2023-02-18 DIAGNOSIS — I2510 Atherosclerotic heart disease of native coronary artery without angina pectoris: Secondary | ICD-10-CM

## 2023-02-18 MED ORDER — HYDRALAZINE HCL 50 MG PO TABS
50.0000 mg | ORAL_TABLET | Freq: Three times a day (TID) | ORAL | 2 refills | Status: DC
Start: 2023-02-18 — End: 2024-02-16

## 2023-02-19 ENCOUNTER — Telehealth: Payer: Self-pay | Admitting: Pharmacist

## 2023-02-19 NOTE — Telephone Encounter (Signed)
Patient calls office and requests assistance with Eliquis (apixaban) 5mg  BID due to financial hardship.   Since last contact patient reports doing well and denied any bruising/bleeding.   Medication Samples have been provided for the patient pick-up.  Drug name: Eliquis (apixaban)       Strength: 5mg         Qty: 56 (4 weeks)  LOT: [id number]  Exp.Date: 03/26/2024  Dosing instructions: 1 BID  The patient has been instructed regarding the correct time, dose, and frequency of taking this medication, including desired effects and most common side effects.   Madelon Lips 1:51 PM 02/19/2023    Total time with patient call and documentation of interaction: 11 minutes.

## 2023-02-20 NOTE — Telephone Encounter (Signed)
Reviewed and agree with Dr Koval's plan.   

## 2023-02-21 ENCOUNTER — Telehealth: Payer: Self-pay

## 2023-02-21 NOTE — Telephone Encounter (Signed)
Renal coordinator attempted to call patient on today regarding final attempt for [facility] referral. No answer from patient after multiple rings. Coordinator left confidential voicemail for patient to return call.  Follow up will be sent back to clinical lead at [facility].    Baruch Gouty Renal Coordinator [facility] [phone]

## 2023-02-27 ENCOUNTER — Other Ambulatory Visit: Payer: Self-pay

## 2023-02-27 MED ORDER — OMEPRAZOLE 40 MG PO CPDR: DELAYED_RELEASE_CAPSULE | ORAL | 1 refills | Status: DC

## 2023-02-28 ENCOUNTER — Ambulatory Visit: Payer: PPO

## 2023-02-28 DIAGNOSIS — I428 Other cardiomyopathies: Secondary | ICD-10-CM

## 2023-03-01 LAB — CUP PACEART REMOTE DEVICE CHECK
Battery Remaining Longevity: 78 mo
Battery Remaining Percentage: 88 %
Brady Statistic RA Percent Paced: 0 %
Brady Statistic RV Percent Paced: 98 %
Date Time Interrogation Session: 20241004030200
HighPow Impedance: 63 Ohm
Implantable Lead Connection Status: 753985
Implantable Lead Connection Status: 753985
Implantable Lead Implant Date: 20190301
Implantable Lead Implant Date: 20190301
Implantable Lead Location: 753858
Implantable Lead Location: 753860
Implantable Pulse Generator Implant Date: 20231222
Lead Channel Impedance Value: 560 Ohm
Lead Channel Impedance Value: 741 Ohm
Lead Channel Pacing Threshold Amplitude: 0.6 V
Lead Channel Pacing Threshold Pulse Width: 0.4 ms
Lead Channel Setting Pacing Amplitude: 2 V
Lead Channel Setting Pacing Amplitude: 4 V
Lead Channel Setting Pacing Pulse Width: 0.4 ms
Lead Channel Setting Pacing Pulse Width: 1.5 ms
Lead Channel Setting Sensing Sensitivity: 0.6 mV
Lead Channel Setting Sensing Sensitivity: 1 mV
Pulse Gen Serial Number: 285896

## 2023-03-10 NOTE — Progress Notes (Deleted)
Cardiology Office Note:    Date:  03/10/2023   ID:  Anthony [name] 02-Jun-1950, MRN [id number]  PCP:  Anthony Laroche, DO   [facility] Providers Cardiologist:  Anthony Pates, [name] Electrophysiologist:  Anthony Manges, [name]  { Referring [name]: Anthony Laroche, DO   History of Present Illness:    Anthony [name] is a 72 y.o. male with a hx of atrial fibrillation, AV ablation 07/2017 with PM/ICD placement, CAD s/p PCI in 2013 of circumflex lesion extending into second obtuse marginal branch with DES, hypertension, CKD 4, chronic systolic and diastolic heart failure, OSA who was previously followed by Anthony [name] who presents to clinic for follow-up.  Previous cardiac catheterization in 2013 with DES to circumflex extending into second obtuse marginal branch. Myoview 2007 with no ischemia.  Echocardiogram 2018 LVEF 25 to 30% improved in 2019 to 55-60%.  Previous testing for amyloid with negative PYP.  Seen by Anthony [name] 06/2020 with no edema, orthopnea, PND.  No changes were made at that time.  Hospitalized 01/2021 with leg weakness which normalized and neurology was consulted.     Saw Dr. Graciela [name] 03/26/2021 noted marked dyspnea on exertion.  He was compliant with CPAP.  His same regimen was continued.  He was seen 04/16/2021. Noted to have elevated OptiVol level that morning by ICD check.  He noted dyspnea and lower extremity edema but no orthopnea nor PND.  He was recommended to start Lasix 40 mg daily as well as potassium.  Had ICD generator change 04/2023 where he did well.  He was last seen 07/04/22 for preop evaluation for epidural steroid injection. Was doing well at that time.  Today, the patient states that he feels well today. Has been having cramps in his hands usually in the afternoon which has been bothersome. Continues to have dyspnea on exertion but this is stable without progressing. No chest pain, orthopnea, PND, palpitations or LE edema. Tolerating medications as prescribed. Blood  pressure mainly 125-138 at home. No bleeding issues on the apixaban.    Past Medical History:  Diagnosis Date   Arthritis    Benign neoplasm of descending colon    Benign neoplasm of sigmoid colon    Benign neoplasm of transverse colon    CAD in native artery    a. reported MI 2000, 2001, s/p stenting of the circumflex lesion extending into the second obtuse marginal branch with a drug-eluting stent in 2003. b. low risk nuc 2015.   Chronic atrial fibrillation (HCC)    Chronic combined systolic and diastolic CHF (congestive heart failure) (HCC)    a. Previously diastolic, then EF 25-30% in 10/2016.   CKD (chronic kidney disease), stage III (HCC)    Depression    Diabetes mellitus    Edema of extremities    Erectile dysfunction    GERD (gastroesophageal reflux disease)    Hyperlipidemia    Hypertension    Myocardial infarction (HCC) 2000&2001   Obesity    Onychomycosis    OSA (obstructive sleep apnea)    S/P ICD (internal cardiac defibrillator) procedure and BiV device,  07/25/17 Medtronic  07/26/2017   Seizures (HCC)    Sleep apnea    Stroke (HCC)    Tubular adenoma of colon 10/2002    Past Surgical History:  Procedure Laterality Date   ANGIOPLASTY  2001   stent x 1   AV NODE ABLATION N/A 08/20/2017   Procedure: AV NODE ABLATION;  Surgeon: Anthony [name];  Location:  [facility] INVASIVE CV LAB;  Service: Cardiovascular;  Laterality: N/A;   BIV ICD GENERATOR CHANGEOUT N/A 05/17/2022   Procedure: BIV ICD GENERATOR CHANGEOUT;  Surgeon: Anthony [name];  Location: [facility] INVASIVE CV LAB;  Service: Cardiovascular;  Laterality: N/A;   BIV ICD INSERTION CRT-D N/A 07/25/2017   Procedure: BIV ICD INSERTION CRT-D;  Surgeon: Anthony [name];  Location: [facility] INVASIVE CV LAB;  Service: Cardiovascular;  Laterality: N/A;   COLONOSCOPY  2000?   negative   COLONOSCOPY WITH PROPOFOL N/A 02/08/2019   Procedure: COLONOSCOPY WITH PROPOFOL;  Surgeon: Anthony [name];  Location: [facility];   Service: Endoscopy;  Laterality: N/A;   FOOT ARTHROTOMY Right    POLYPECTOMY  02/08/2019   Procedure: POLYPECTOMY;  Surgeon: Anthony [name];  Location: [facility] ENDOSCOPY;  Service: Endoscopy;;   TOE AMPUTATION Left 2012    Current Medications: No outpatient medications have been marked as taking for the 03/11/23 encounter (Appointment) with Anthony Riffle, [name].     Allergies:   Enalapril maleate   Social History   Socioeconomic History   Marital status: Married    Spouse name: Anthony [name]   Number of children: 2   Years of education: GED   Highest education level: Not on file  Occupational History   Occupation: CUSTOMER SERVICES    Employer: [employer]    Comment: [employer]  Tobacco Use   Smoking status: Former    Current packs/day: 0.00    Average packs/day: 0.3 packs/day for 51.4 years (12.9 ttl pk-yrs)    Types: Cigarettes    Start date: 05/28/1967    Quit date: 10/28/2018    Years since quitting: 4.3    Passive exposure: Past   Smokeless tobacco: Never  Vaping Use   Vaping status: Never Used  Substance and Sexual Activity   Alcohol use: No    Comment: quit in 1993   Drug use: No   Sexual activity: Not on file  Other Topics Concern   Not on file  Social History Narrative   Patient is right handed, consumes caffeine rarely. Patient resides in home with wife.   Social Determinants of Health   Financial Resource Strain: Medium Risk (10/18/2022)   Overall Financial Resource Strain (CARDIA)    Difficulty of Paying Living Expenses: Somewhat hard  Food Insecurity: No Food Insecurity (10/18/2022)   Hunger Vital Sign    Worried About Running Out of Food in the Last Year: Never true    Ran Out of Food in the Last Year: Never true  Transportation Needs: No Transportation Needs (10/18/2022)   PRAPARE - Administrator, Civil Service (Medical): No    Lack of Transportation (Non-Medical): No  Physical Activity: Sufficiently Active (10/18/2022)   Exercise Vital Sign    Days of  Exercise per Week: 5 days    Minutes of Exercise per Session: 30 min  Stress: No Stress Concern Present (10/18/2022)   [employer] of Occupational Health - Occupational Stress Questionnaire    Feeling of Stress : Not at all  Social Connections: Moderately Integrated (10/18/2022)   Social Connection and Isolation Panel [NHANES]    Frequency of Communication with Friends and Family: More than three times a week    Frequency of Social Gatherings with Friends and Family: Three times a week    Attends Religious Services: More than 4 times per year    Active Member of Clubs or Organizations: No    Attends Banker Meetings: Never  Marital Status: Married     Family History: The patient's family history includes CVA in his mother; Diabetes in his mother; Heart attack in his father and mother. There is no history of Colon cancer or Stomach cancer.  ROS:   Review of Systems  Constitutional:  Negative for chills and fever.  HENT:  Negative for nosebleeds.   Eyes:  Negative for double vision and pain.  Respiratory:  Negative for cough and shortness of breath.   Cardiovascular:  Negative for chest pain, palpitations, orthopnea, claudication, leg swelling and PND.  Genitourinary:  Negative for frequency.  Musculoskeletal:  Positive for back pain and myalgias. Negative for falls.  Neurological:  Negative for dizziness and loss of consciousness.  Psychiatric/Behavioral:  Negative for depression.      EKGs/Labs/Other Studies Reviewed:    The following studies were reviewed today:  Carotid duplex 01/2021 Right Carotid: The extracranial vessels were near-normal with only minimal wall thickening or plaque.   Left Carotid: The extracranial vessels were near-normal with only minimal  wall thickening or plaque.   Vertebrals:  Bilateral vertebral arteries demonstrate antegrade flow.  Subclavians: Normal flow hemodynamics were seen in bilateral subclavian arteries.    Echo  01/2021  1. Left ventricular ejection fraction, by estimation, is 60 to 65%. The  left ventricle has normal function. The left ventricle has no regional  wall motion abnormalities. There is mild left ventricular hypertrophy.  Left ventricular diastolic parameters are indeterminate.   2. Pacing wires in RA/RV. Right ventricular systolic function is normal.  The right ventricular size is normal.   3. Left atrial size was moderately dilated.   4. The mitral valve is abnormal. Trivial mitral valve regurgitation. No  evidence of mitral stenosis.   5. The aortic valve was not well visualized. There is mild calcification  of the aortic valve. Aortic valve regurgitation is not visualized. Mild  aortic valve sclerosis is present, with no evidence of aortic valve  stenosis.   6. The inferior vena cava is normal in size with greater than 50%  respiratory variability, suggesting right atrial pressure of 3 mmHg.   Lexiscan Myoview 03/17/2020: The left ventricular ejection fraction is mildly decreased (45-54%). There was no ST segment deviation noted during stress. The study is normal. This is a low risk study. Nuclear stress EF: 53%.   No ischemia or infarction on perfusion images.   EKG:  No new tracing today.  Recent Labs: 08/22/2022: Magnesium 1.8 02/05/2023: BUN 31; Creatinine, Ser 2.92; Hemoglobin 13.2; Platelets 150; Potassium 3.4; Sodium 140   Recent Lipid Panel    Component Value Date/Time   CHOL 146 08/22/2022 1025   TRIG 91 08/22/2022 1025   HDL 59 08/22/2022 1025   CHOLHDL 2.5 08/22/2022 1025   CHOLHDL 3.2 02/28/2016 0906   VLDL 19 02/28/2016 0906   LDLCALC 70 08/22/2022 1025   LDLDIRECT 58 11/06/2011 1434     Risk Assessment/Calculations:           Physical Exam:    VS:  There were no vitals taken for this visit.    Wt Readings from Last 3 Encounters:  01/28/23 229 lb (103.9 kg)  12/24/22 220 lb 6.4 oz (100 kg)  11/15/22 224 lb 9.6 oz (101.9 kg)     GEN:  Comfortable, NAD, ambulates with a cane HEENT: Normal NECK: No JVD; No carotid bruits CARDIAC: RRR, no murmurs, rubs, gallops RESPIRATORY:  Clear to auscultation without rales, wheezing or rhonchi  ABDOMEN: Soft, non-tender, non-distended MUSCULOSKELETAL: Trace  to 1+ LE edema on the left (chronic), trace on the right SKIN: Warm and dry NEUROLOGIC:  Alert and oriented x 3 PSYCHIATRIC:  Normal affect   ASSESSMENT:    No diagnosis found.   PLAN:    In order of problems listed above:  #CAD s/p to PCI Lcx: Doing well without anginal symptoms. -Continue crestor 20mg  daily -Continue losartan 25mg  daily -Continue coreg 25mg  BID   #Chronic HFpEF: #Nonischemic CM: #S/p ICD placement LVEF 25-30% in 2018 improved to 55-60% in 2019 and 60-65% in 01/2021. Myoview 2007 with no ischemia. He was evaluated for possible cardiac amyloidosis with negative PYP scan. Doing well and euvolemic on exam.  -Continue lasix 40mg  daily -Continue losartan 25mg  daily -Continue coreg 25mg  BID -Continue hydralazine 50mg  TID -Follows with Dr. Graciela [name] for ICD; battery changed in 04/2023   #Permanent Afib s/p AVN ablation: #Hypercoaguable state: CHADS-vasc 7. Followed by Dr. Graciela [name]. Tolerating AC without issues.  -Continue apixaban 5mg  BID -Continue coreg 25mg  BID   #Severe LVH: PYP scan negative for amyloid. -Continue BP medications as below   #HTN: Well controlled and at goal <130/90. -Continue losartan 25mg  daily -Continue coreg 25mg  BID -Continue hydralazine 50mg  TID   #HLD: -Continue crestor 20mg  daily -Repeat cholesterol for monitoring   #DMII on Insulin: -Management per PCP -A1C above goal at 10.7 in 03/2022; followed by Dr. Miquel Dunn -Compliant with insulin      Follow-up:   6 months  Medication Adjustments/Labs and Tests Ordered: Current medicines are reviewed at length with the patient today.  Concerns regarding medicines are outlined above.   No orders of the defined types were  placed in this encounter.  No orders of the defined types were placed in this encounter.  There are no Patient Instructions on file for this visit.      Signed, Anthony Pates, [name]  03/10/2023 8:35 PM    [facility]

## 2023-03-11 ENCOUNTER — Ambulatory Visit: Payer: PPO | Attending: Internal Medicine | Admitting: Internal Medicine

## 2023-03-13 NOTE — Progress Notes (Signed)
Remote ICD transmission.   

## 2023-03-26 DIAGNOSIS — N2581 Secondary hyperparathyroidism of renal origin: Secondary | ICD-10-CM | POA: Diagnosis not present

## 2023-03-26 DIAGNOSIS — N1832 Chronic kidney disease, stage 3b: Secondary | ICD-10-CM | POA: Diagnosis not present

## 2023-03-26 DIAGNOSIS — D631 Anemia in chronic kidney disease: Secondary | ICD-10-CM | POA: Diagnosis not present

## 2023-03-26 DIAGNOSIS — E1122 Type 2 diabetes mellitus with diabetic chronic kidney disease: Secondary | ICD-10-CM | POA: Diagnosis not present

## 2023-03-26 DIAGNOSIS — I129 Hypertensive chronic kidney disease with stage 1 through stage 4 chronic kidney disease, or unspecified chronic kidney disease: Secondary | ICD-10-CM | POA: Diagnosis not present

## 2023-04-04 ENCOUNTER — Encounter: Payer: Self-pay | Admitting: Internal Medicine

## 2023-04-04 ENCOUNTER — Telehealth: Payer: Self-pay

## 2023-04-04 NOTE — Telephone Encounter (Signed)
   Pre-operative Risk Assessment    Patient Name: Anthony [name]  DOB: June 06, 1950 MRN: [id number]  Last office visit : 11/29/2022  Upcoming upcoming appointment : N/A    Request for Surgical Clearance    Procedure:   Lumbar Epidural Steroid injection (without sedation)  Date of Surgery:  Clearance TBD   (to be scheduled after 04/30/23)                          Surgeon:  Not indicated Surgeon's Group or Practice Name:  [facility]. Phone number:  [phone] Fax number:  [phone]   Type of Clearance Requested:   - Medical  - Pharmacy:  Hold Apixaban (Eliquis) Discontinue 3 days prior to procedure/ Resume 24 hours after procedure   Type of Anesthesia:   without sedation   Additional requests/questions:   Elyse Jarvis   04/04/2023, 1:13 PM

## 2023-04-04 NOTE — Progress Notes (Signed)
PERIOPERATIVE PRESCRIPTION FOR IMPLANTED CARDIAC DEVICE PROGRAMMING  Patient Information: Name:  Anthony [name]  DOB:  1950-10-15  MRN:  [id number]  Request for Surgical Clearance     Procedure:   Lumbar Epidural Steroid injection (without sedation)   Date of Surgery:  Clearance TBD   (to be scheduled after 04/30/23)                          Surgeon:  Not indicated Surgeon's Group or Practice Name:  [facility]. Phone number:  [phone] Fax number:  [phone] Device Information:  Clinic EP Physician:  Sherryl Manges, MD   Device Type:  Defibrillator Manufacturer and Phone #:  [employer]: [phone] Pacemaker Dependent?:  Yes.   Date of Last Device Check:  10/04/22 Normal Device Function?:  Yes.    Electrophysiologist's Recommendations:  Have magnet available. Provide continuous ECG monitoring when magnet is used or reprogramming is to be performed.  Procedure will likely interfere with device function.  Device should be programmed:  Tachy therapies disabled and Asynchronous pacing during procedure and returned to normal programming after procedure  Per Device Clinic Standing Orders, Skip Mayer, RN  4:40 PM 04/04/2023

## 2023-04-04 NOTE — Telephone Encounter (Signed)
Pharmacy please advise on holding Eliquis prior to lumbar ESI scheduled for 12/4 or after. Thank you.

## 2023-04-06 NOTE — Telephone Encounter (Signed)
Patient with diagnosis of atrial fibrillation on Eliquis for anticoagulation.    Procedure:   Lumbar Epidural Steroid injection (without sedation)   Date of Surgery:  Clearance TBD   (to be scheduled after 04/30/23)   CHA2DS2-VASc Score = 7   This indicates a 11.2% annual risk of stroke. The patient's score is based upon: CHF History: 1 HTN History: 1 Diabetes History: 1 Stroke History: 2 Vascular Disease History: 1 Age Score: 1 Gender Score: 0    CrCl 34 Platelet count 150  Per office protocol, patient can hold Eliquis for 3 days prior to procedure.   Patient will need bridging with Lovenox (enoxaparin) around procedure.  Please reach out to pharmacy when date is set and we can coordinate bridging.    **This guidance is not considered finalized until pre-operative APP has relayed final recommendations.**

## 2023-04-07 NOTE — Telephone Encounter (Signed)
1st attempt to reach pt regarding surgical clearance and the need for a tele visit.  Voicemail is full and couldn't leave a message.

## 2023-04-07 NOTE — Telephone Encounter (Signed)
   Name: Anthony [name]  DOB: [birth year]  MRN: [id number]  Primary Cardiologist: Dietrich Pates, MD   Preoperative team, please contact this patient and set up a phone call appointment for further preoperative risk assessment. Please obtain consent and complete medication review. Thank you for your help.Last appt on 02/13/2023.   I confirm that guidance regarding antiplatelet and oral anticoagulation therapy has been completed and, if necessary, noted below.  Per office protocol, patient can hold Eliquis for 3 days prior to procedure.   Patient will need bridging with Lovenox (enoxaparin) around procedure.  I also confirmed the patient resides in the state of [place]. As per [facility] Medical Board telemedicine laws, the patient must reside in the state in which the provider is licensed.   Joni Reining, NP 04/07/2023, 7:52 AM [facility]

## 2023-04-11 ENCOUNTER — Other Ambulatory Visit: Payer: Self-pay

## 2023-04-11 DIAGNOSIS — I4821 Permanent atrial fibrillation: Secondary | ICD-10-CM

## 2023-04-11 MED ORDER — APIXABAN 5 MG PO TABS
5.0000 mg | ORAL_TABLET | Freq: Two times a day (BID) | ORAL | Status: DC
Start: 2023-04-11 — End: 2023-09-09

## 2023-04-11 NOTE — Telephone Encounter (Signed)
Rx was written as a sample.   If we are sending to pharmacy, dispense quantity and refills will need to be updated.  If we are providing patient with a sample, please indicate how many boxes can be given.   Veronda Prude, RN

## 2023-04-11 NOTE — Telephone Encounter (Signed)
Adrienne patients Case Manager calls nurse line requesting a refill on patients Eliquis.   She reports he has been getting samples from our office due to him being in the donut hole.   Advised we can send the medication in and try to use a coupon voucher that is available online.   If not, will forward to pharmacy team for samples.   Will forward to PCP.

## 2023-04-11 NOTE — Telephone Encounter (Signed)
Spoke with Dr. Raymondo Band.   Received verbal okay to provide patient with 3 boxes of sample Eliquis.   Lot: [id number] Exp: 06/26/2024  Placed labeled Eliquis at my desk for pick up.   Veronda Prude, RN

## 2023-04-14 NOTE — Telephone Encounter (Signed)
Patient presents to clinic for samples.   Provided with samples per Dr. Raymondo Band.  Veronda Prude, RN

## 2023-04-15 NOTE — Telephone Encounter (Signed)
I s/w the pt and he tells me that his procedure is set for 04/30/23. I informed the pt that the surgeon office stated TBD at this time. There is a side note in the clearance request that came from the surgeon's office: Clearance TBD   (to be scheduled after 04/30/23)    Pt said no procedure is on 04/30/23. Pt said he has to set up transportation as well so he needs to know when the procedure is. I informed the pt that I will need to reach out to [facility] for clarification. We also did not have any televisits before 04/30/23. I assured the pt that I will call him sometime tomorrow after I s/w the ortho office.   I am going to also send these notes to orthopedic to please call our office to confirm procedure date.

## 2023-04-16 ENCOUNTER — Telehealth: Payer: Self-pay | Admitting: *Deleted

## 2023-04-16 NOTE — Telephone Encounter (Signed)
I called [facility] and s/w Burna Mortimer. In our conversation it was confirmed the procedure was planned for 04/30/23. I did say that there was note placed saying to schedule after 05/20/23, though not sure where that info came from. I did tell Burna Mortimer the pt needs a tele visit as well as he is going to need to be set up with Lovenox bridging due to hold x 3 days for Eliquis.   Burna Mortimer asked would it be better if the procedure was moved out to 05/16/23 to allow enough time. I said that will be fine. I will call the pt and schedule tele pre op appt and then will let the coumadin clinic know as he will need an appt with them to set up Lovenox bridge. I assured Burna Mortimer that I will let the pt know the procedure date has been changed to 05/16/23 1 pm.   Pt has been scheduled tele pre op appt 05/02/23. Med rec and consent are done. Pt has been made aware of appt change for his procedure, see the notes above. Pt read back the new date for procedure to be on 05/16/23 @ 1 pm. Pt aware that the coumadin clinic will also call him to set up Lovenox bridging.

## 2023-04-16 NOTE — Telephone Encounter (Signed)
Pt has been scheduled tele pre op appt 05/02/23. Med rec and consent are done.

## 2023-04-22 ENCOUNTER — Other Ambulatory Visit: Payer: Self-pay

## 2023-04-22 DIAGNOSIS — E119 Type 2 diabetes mellitus without complications: Secondary | ICD-10-CM

## 2023-04-22 MED ORDER — NOVOLOG FLEXPEN 100 UNIT/ML ~~LOC~~ SOPN
PEN_INJECTOR | SUBCUTANEOUS | 3 refills | Status: DC
Start: 2023-04-22 — End: 2023-06-19

## 2023-04-24 ENCOUNTER — Other Ambulatory Visit: Payer: Self-pay | Admitting: Family Medicine

## 2023-04-28 ENCOUNTER — Other Ambulatory Visit: Payer: Self-pay

## 2023-04-28 MED ORDER — PAROXETINE HCL 30 MG PO TABS: 30.0000 mg | ORAL_TABLET | Freq: Every day | ORAL | 0 refills | Status: DC

## 2023-04-28 NOTE — Telephone Encounter (Signed)
Called and informed patient medication was refilled - scheduled patient for follow up appointment.   Thanks [employer]

## 2023-05-02 ENCOUNTER — Encounter: Payer: Self-pay | Admitting: Family Medicine

## 2023-05-02 ENCOUNTER — Ambulatory Visit: Payer: PPO | Attending: Cardiology

## 2023-05-02 ENCOUNTER — Ambulatory Visit (INDEPENDENT_AMBULATORY_CARE_PROVIDER_SITE_OTHER): Payer: PPO | Admitting: Family Medicine

## 2023-05-02 VITALS — BP 115/57 | HR 70 | Ht 71.0 in | Wt 224.6 lb

## 2023-05-02 DIAGNOSIS — M5441 Lumbago with sciatica, right side: Secondary | ICD-10-CM | POA: Diagnosis not present

## 2023-05-02 DIAGNOSIS — Z1211 Encounter for screening for malignant neoplasm of colon: Secondary | ICD-10-CM | POA: Diagnosis not present

## 2023-05-02 DIAGNOSIS — I10 Essential (primary) hypertension: Secondary | ICD-10-CM

## 2023-05-02 DIAGNOSIS — N400 Enlarged prostate without lower urinary tract symptoms: Secondary | ICD-10-CM

## 2023-05-02 DIAGNOSIS — R569 Unspecified convulsions: Secondary | ICD-10-CM | POA: Diagnosis not present

## 2023-05-02 DIAGNOSIS — G8929 Other chronic pain: Secondary | ICD-10-CM

## 2023-05-02 DIAGNOSIS — Z01810 Encounter for preprocedural cardiovascular examination: Secondary | ICD-10-CM | POA: Diagnosis not present

## 2023-05-02 DIAGNOSIS — K219 Gastro-esophageal reflux disease without esophagitis: Secondary | ICD-10-CM | POA: Diagnosis not present

## 2023-05-02 DIAGNOSIS — E119 Type 2 diabetes mellitus without complications: Secondary | ICD-10-CM

## 2023-05-02 DIAGNOSIS — N184 Chronic kidney disease, stage 4 (severe): Secondary | ICD-10-CM

## 2023-05-02 DIAGNOSIS — F32A Depression, unspecified: Secondary | ICD-10-CM | POA: Diagnosis not present

## 2023-05-02 DIAGNOSIS — Z794 Long term (current) use of insulin: Secondary | ICD-10-CM

## 2023-05-02 DIAGNOSIS — K635 Polyp of colon: Secondary | ICD-10-CM

## 2023-05-02 LAB — POCT GLYCOSYLATED HEMOGLOBIN (HGB A1C): HbA1c, POC (controlled diabetic range): 9 % — AB (ref 0.0–7.0)

## 2023-05-02 MED ORDER — SEMAGLUTIDE(0.25 OR 0.5MG/DOS) 2 MG/1.5ML ~~LOC~~ SOPN: 0.2500 mg | PEN_INJECTOR | SUBCUTANEOUS | 0 refills | Status: DC

## 2023-05-02 NOTE — Progress Notes (Signed)
    SUBJECTIVE:   CHIEF COMPLAINT / HPI:   Diabetes, Type 2 - Last A1c 9.2 - Medications: tresiba 30u daily, novolog 15-18u breakfast and lunch, 20u in evening. Previously on trulicity but had trouble affording. Previously on jardiance but was told to stop due to worsened kidney function.  - Compliance: good - Checking BG at home: yes, fasting this am 241. Highest mid 200s. Some <100. - Eye exam: UTD - Foot exam: UTD - Microalbumin: UTD - Statin: yes - PNA vaccine: UTD - Denies symptoms of hypoglycemia, polyuria, polydipsia, numbness extremities, foot ulcers/trauma  CKD - follows with Nephrology.  GERD - on prilosec. Takes daily. Has indigestion when doesn't take.  BPH - on flomax. Doing well.   Chronic low back pain - on gabapentin, chronic oxycodone, prescribed by pain management. Followed by [facility], planning for [facility] after cardiac risk assessment later today. Planning for injection 12/20.  H/o seizures - not in many years.   Anxiety/depression - on paxil for many years. Doing well.   HM - needs colonscopy, shingles vaccines.   OBJECTIVE:   BP (!) 115/57   Pulse 70   Ht 5\' 11"  (1.803 m)   Wt 224 lb 9.6 oz (101.9 kg)   SpO2 98%   BMI 31.33 kg/m   Gen: well appearing, in NAD Card: Reg rate Lungs: comfortable WOB on RA Ext: WWP, no edema   ASSESSMENT/PLAN:   HYPERTENSION, BENIGN SYSTEMIC Low normal today but without orthostatic symptoms and previously higher on same regimen. No changes today. F/u 4 weeks.  Insulin dependent type 2 diabetes mellitus (HCC) Uncontrolled. Initiate ozempic, continue insulin. F/u 4 weeks.   Low back pain with right-sided sciatica Anticipating ESI soon.  Chronic kidney disease (CKD), stage IV (severe) (HCC) Continue to follow with Nephro. Labs next visit.  BPH (benign prostatic hyperplasia) Doing well on current regimen, no changes made today.  Depression Doing well on current regimen, no changes made today. Mindful of  age on paxil.  GASTROESOPHAGEAL REFLUX, NO ESOPHAGITIS Doing well on current regimen, no changes made today.   HM - referred to GI for colonoscopy  F/u 4 weeks for diabetes and BP recheck  Caro Laroche, DO

## 2023-05-02 NOTE — Assessment & Plan Note (Signed)
Low normal today but without orthostatic symptoms and previously higher on same regimen. No changes today. F/u 4 weeks.

## 2023-05-02 NOTE — Assessment & Plan Note (Addendum)
Uncontrolled. Initiate ozempic, continue insulin. F/u 4 weeks.

## 2023-05-02 NOTE — Assessment & Plan Note (Signed)
Doing well on current regimen, no changes made today. 

## 2023-05-02 NOTE — Assessment & Plan Note (Signed)
Doing well on current regimen, no changes made today. Mindful of age on paxil.

## 2023-05-02 NOTE — Assessment & Plan Note (Signed)
Continue to follow with Nephro. Labs next visit.

## 2023-05-02 NOTE — Patient Instructions (Signed)
It was great to see you!  Our plans for today:  - Start the ozempic for your diabetes. - We are referring you to GI doctor. Let us know if you don't hear about an appointment in the next few weeks.   Take care and seek immediate care sooner if you develop any concerns.   Dr. Linwood Dibbles

## 2023-05-02 NOTE — Progress Notes (Signed)
Virtual Visit via Telephone Note   Because of Anthony [name]'s co-morbid illnesses, he is at least at moderate risk for complications without adequate follow up.  This format is felt to be most appropriate for this patient at this time.  The patient did not have access to video technology/had technical difficulties with video requiring transitioning to audio format only (telephone).  All issues noted in this document were discussed and addressed.  No physical exam could be performed with this format.  Please refer to the patient's chart for his consent to telehealth for [facility].  Evaluation Performed:  Preoperative cardiovascular risk assessment _____________   Date:  05/02/2023   Patient ID:  Anthony [name], DOB [birth year], MRN [id number] Patient Location:  Home Provider location:   Office  Primary Care Provider:  Caro Laroche, DO Primary Cardiologist:  Dietrich Pates, MD  Chief Complaint / Patient Profile   72 y.o. y/o male with a h/o hypertension, coronary atherosclerosis, nonischemic cardiomyopathy, atrial fibrillation who is pending lumbar epidural steroid injection and presents today for telephonic preoperative cardiovascular risk assessment.  History of Present Illness    Anthony [name] is a 72 y.o. male who presents via audio/video conferencing for a telehealth visit today.  Pt was last seen in cardiology clinic on 10/04/2022 by Dr. Graciela Husbands.  At that time Anthony [name] was doing well .  The patient is now pending procedure as outlined above. Since his last visit, he continues to be stable from a cardiac standpoint.  Today he denies chest pain, shortness of breath, lower extremity edema, fatigue, palpitations, melena, hematuria, hemoptysis, diaphoresis, weakness, presyncope, syncope, orthopnea, and PND.   Past Medical History    Past Medical History:  Diagnosis Date   Arthritis    Benign neoplasm of descending colon    Benign neoplasm of sigmoid colon     Benign neoplasm of transverse colon    CAD in native artery    a. reported MI 2000, 2001, s/p stenting of the circumflex lesion extending into the second obtuse marginal branch with a drug-eluting stent in 2003. b. low risk nuc 2015.   Chronic atrial fibrillation (HCC)    Chronic combined systolic and diastolic CHF (congestive heart failure) (HCC)    a. Previously diastolic, then EF 25-30% in 10/2016.   CKD (chronic kidney disease), stage III (HCC)    Depression    Diabetes mellitus    Edema of extremities    Erectile dysfunction    GERD (gastroesophageal reflux disease)    Hyperlipidemia    Hypertension    Myocardial infarction (HCC) 2000&2001   Obesity    Onychomycosis    OSA (obstructive sleep apnea)    S/P ICD (internal cardiac defibrillator) procedure and BiV device,  07/25/17 [employer]  07/26/2017   Seizures (HCC)    Sleep apnea    Stroke (HCC)    Tubular adenoma of colon 10/2002   Past Surgical History:  Procedure Laterality Date   ANGIOPLASTY  2001   stent x 1   AV NODE ABLATION N/A 08/20/2017   Procedure: AV NODE ABLATION;  Surgeon: Duke Salvia, MD;  Location[facility] INVASIVE CV LAB;  Service: Cardiovascular;  Laterality: N/A;   BIV ICD GENERATOR CHANGEOUT N/A 05/17/2022   Procedure: BIV ICD GENERATOR CHANGEOUT;  Surgeon: Duke Salvia, MD;  Location: [facility] INVASIVE CV LAB;  Service: Cardiovascular;  Laterality: N/A;   BIV ICD INSERTION CRT-D N/A 07/25/2017   Procedure: BIV ICD INSERTION CRT-D;  Surgeon: Duke Salvia, MD;  Location: [facility] INVASIVE CV LAB;  Service: Cardiovascular;  Laterality: N/A;   COLONOSCOPY  2000?   negative   COLONOSCOPY WITH PROPOFOL N/A 02/08/2019   Procedure: COLONOSCOPY WITH PROPOFOL;  Surgeon: Meryl Dare, MD;  Location: [facility] ENDOSCOPY;  Service: Endoscopy;  Laterality: N/A;   FOOT ARTHROTOMY Right    POLYPECTOMY  02/08/2019   Procedure: POLYPECTOMY;  Surgeon: Meryl Dare, MD;  Location: [facility] ENDOSCOPY;  Service: Endoscopy;;   TOE AMPUTATION  Left 2012    Allergies  Allergies  Allergen Reactions   Enalapril Maleate Cough    Home Medications    Prior to Admission medications   Medication Sig Start Date End Date Taking? Authorizing Provider  acetaminophen (TYLENOL) 650 MG CR tablet Take 1,300 mg by mouth 2 (two) times daily as needed for pain.    [provider]  apixaban (ELIQUIS) 5 MG TABS tablet Take 1 tablet (5 mg total) by mouth 2 (two) times daily. 04/11/23   Westley Chandler, MD  calcitRIOL (ROCALTROL) 0.5 MCG capsule Take 0.5 mcg by mouth daily. 09/02/22   [provider]  carvedilol (COREG) 25 MG tablet TAKE 1 TABLET 2 TIMES DAILY. 01/28/23   Caro Laroche, DO  Continuous Glucose Sensor (FREESTYLE LIBRE 3 SENSOR) MISC Place 1 sensor on the skin every 14 days. Use to check glucose continuously 12/24/22   McDiarmid, Leighton Roach, MD  diclofenac Sodium (VOLTAREN) 1 % GEL Apply 4 g topically 4 (four) times daily. 12/13/21   Moses Manners, MD  furosemide (LASIX) 20 MG tablet TAKE 3 TABLETS BY MOUTH EVERY DAY 04/28/23   Caro Laroche, DO  gabapentin (NEURONTIN) 300 MG capsule Take 1 capsule (300 mg total) by mouth 3 (three) times daily. 10/04/22   McDiarmid, Leighton Roach, MD  hydrALAZINE (APRESOLINE) 50 MG tablet Take 1 tablet (50 mg total) by mouth 3 (three) times daily. 02/18/23   Duke Salvia, MD  insulin aspart (NOVOLOG FLEXPEN) 100 UNIT/ML FlexPen INJECT INTO THE SKIN THREE TIMES DAILY BEFORE MEALS. INJECT 8-10 UNITS BEFORE BREAKFAST AND LUNCH AND INJECT 15-20 UNITS BEFORE DINNER. 04/22/23   Caro Laroche, DO  insulin degludec (TRESIBA FLEXTOUCH) 200 UNIT/ML FlexTouch Pen Inject 26 Units into the skin daily. 10/04/22   McDiarmid, Leighton Roach, MD  Insulin Pen Needle (PEN NEEDLES) 32G X 4 MM MISC 1 Device by Does not apply route as directed. 02/03/23   Caro Laroche, DO  Lancets [facility] ULTRASOFT) lancets Use as instructed 01/13/19   Melene Plan, MD  latanoprost (XALATAN) 0.005 % ophthalmic solution Place 1  drop into both eyes at bedtime. 09/03/22   [provider]  loratadine (CLARITIN) 10 MG tablet Take 10 mg by mouth daily as needed for allergies.    [provider]  losartan-hydrochlorothiazide (HYZAAR) 50-12.5 MG tablet Take 1 tablet by mouth daily. 11/15/22   McDiarmid, Leighton Roach, MD  Multiple Vitamin (MULTIVITAMIN) tablet Take 1 tablet by mouth daily.      [provider]  nitroGLYCERIN (NITROSTAT) 0.4 MG SL tablet Place 1 tablet (0.4 mg total) under the tongue every 5 (five) minutes as needed for chest pain. If no relief by 3rd tab, call 911 10/04/22   McDiarmid, Leighton Roach, MD  omeprazole (PRILOSEC) 40 MG capsule TAKE ONE CAPSULE BY MOUTH DAILY BEFORE SUPPER 02/27/23   Caro Laroche, DO  Oxycodone HCl 10 MG TABS Take 10 mg by mouth 3 (three) times daily. 09/18/22  [provider]  PARoxetine (PAXIL) 30 MG tablet Take 1 tablet (30 mg total) by mouth daily. 04/28/23   Caro Laroche, DO  potassium chloride SA (KLOR-CON M20) 20 MEQ tablet Take 1 tablet (20 mEq total) by mouth daily. 01/16/23   Caro Laroche, DO  rosuvastatin (CRESTOR) 20 MG tablet Take 1 tablet (20 mg total) by mouth daily. 08/22/22   Meriam Sprague, MD  tamsulosin (FLOMAX) 0.4 MG CAPS capsule TAKE 1 CAPSULE BY MOUTH EVERYDAY AT BEDTIME 08/30/22   Westley Chandler, MD    Physical Exam    Vital Signs:  ALIREZA OLAIZ does not have vital signs available for review today.  Given telephonic nature of communication, physical exam is limited. AAOx3. NAD. Normal affect.  Speech and respirations are unlabored.  Accessory Clinical Findings    None  Assessment & Plan    1.  Preoperative Cardiovascular Risk Assessment:  Lumbar Epidural Steroid injection (without sedation)   Date of Surgery:  Clearance TBD   (to be scheduled after 04/30/23)                          Surgeon:  Not indicated Surgeon's Group or Practice Name:  [facility]. Phone number:  [phone] Fax number:   [phone]      Primary Cardiologist: Dietrich Pates, MD  Chart reviewed as part of pre-operative protocol coverage. Given past medical history and time since last visit, based on ACC/AHA guidelines, KEYVIN EAVEY would be at acceptable risk for the planned procedure without further cardiovascular testing.   His RCRI is high risk, greater than 11% risk of major cardiac event.  He is able to complete greater than 4 METS of physical activity.  Patient was advised that if he develops new symptoms prior to surgery to contact our office to arrange a follow-up appointment.  He verbalized understanding.  Per office protocol, patient can hold Eliquis for 3 days prior to procedure.   Patient will need bridging with Lovenox (enoxaparin) around procedure.  I will route this recommendation to the requesting party via Epic fax function and remove from pre-op pool.       Time:   Today, I have spent 5 minutes with the patient with telehealth technology discussing medical history, symptoms, and management plan.     Ronney Asters, NP  05/02/2023, 7:17 AM    Prior to patient's phone evaluation I spent greater than 10 minutes reviewing their past medical history and cardiac medications.

## 2023-05-02 NOTE — Assessment & Plan Note (Signed)
Anticipating ESI soon.

## 2023-05-08 ENCOUNTER — Telehealth: Payer: Self-pay

## 2023-05-08 NOTE — Telephone Encounter (Signed)
Pharmacy Patient Advocate Encounter   Received notification from CoverMyMeds that prior authorization for [facility] is required/requested.   The patient is insured through [facility] ADVANTAGE/RX ADVANCE .   PA required; PA submitted to above mentioned insurance via CoverMyMeds Key/confirmation #/EOC [id number]. Status is pending

## 2023-05-09 NOTE — Telephone Encounter (Signed)
Pharmacy Patient Advocate Encounter  Received notification from [facility] ADVANTAGE/RX ADVANCE that Prior Authorization for [facility] has been APPROVED from 05/08/23 to 05/07/24

## 2023-05-23 DIAGNOSIS — Z89422 Acquired absence of other left toe(s): Secondary | ICD-10-CM | POA: Diagnosis not present

## 2023-05-23 DIAGNOSIS — I5040 Unspecified combined systolic (congestive) and diastolic (congestive) heart failure: Secondary | ICD-10-CM | POA: Diagnosis not present

## 2023-05-23 DIAGNOSIS — Z6830 Body mass index (BMI) 30.0-30.9, adult: Secondary | ICD-10-CM | POA: Diagnosis not present

## 2023-05-30 ENCOUNTER — Telehealth: Payer: Self-pay | Admitting: Pharmacist

## 2023-05-30 ENCOUNTER — Ambulatory Visit (INDEPENDENT_AMBULATORY_CARE_PROVIDER_SITE_OTHER): Payer: Medicare PPO

## 2023-05-30 DIAGNOSIS — I428 Other cardiomyopathies: Secondary | ICD-10-CM | POA: Diagnosis not present

## 2023-05-30 NOTE — Telephone Encounter (Signed)
 Patient contacts office to request support with samples of Eliquis  (apixaban ) as the cost is too high for his current budget.   He reports having 1 pill remaining.   Indication: Afib.   Medication Samples have been provided to the patient.  Drug name: Eliquis  (apixaban )       Strength: 5mg         Qty: 56 tablets (4 week supply  LOT: [id number]  Exp.Date: 06/26/2024  Dosing instructions: 1 twice daily.   The patient has been instructed regarding the correct time, dose, and frequency of taking this medication, including desired effects and most common side effects.   Maude Lagos 4:21 PM 05/30/2023  Patient plans to come to office today prior to close in order to pick-up.   Total time with patient call and documentation of interaction: 14 minutes.

## 2023-06-01 LAB — CUP PACEART REMOTE DEVICE CHECK
Battery Remaining Longevity: 78 mo
Battery Remaining Percentage: 85 %
Brady Statistic RA Percent Paced: 0 %
Brady Statistic RV Percent Paced: 98 %
Date Time Interrogation Session: 20250103030100
HighPow Impedance: 71 Ohm
Implantable Lead Connection Status: 753985
Implantable Lead Connection Status: 753985
Implantable Lead Implant Date: 20190301
Implantable Lead Implant Date: 20190301
Implantable Lead Location: 753858
Implantable Lead Location: 753860
Implantable Pulse Generator Implant Date: 20231222
Lead Channel Impedance Value: 589 Ohm
Lead Channel Impedance Value: 771 Ohm
Lead Channel Pacing Threshold Amplitude: 0.6 V
Lead Channel Pacing Threshold Pulse Width: 0.4 ms
Lead Channel Setting Pacing Amplitude: 2 V
Lead Channel Setting Pacing Amplitude: 4 V
Lead Channel Setting Pacing Pulse Width: 0.4 ms
Lead Channel Setting Pacing Pulse Width: 1.5 ms
Lead Channel Setting Sensing Sensitivity: 0.6 mV
Lead Channel Setting Sensing Sensitivity: 1 mV
Pulse Gen Serial Number: 285896

## 2023-06-02 ENCOUNTER — Other Ambulatory Visit (HOSPITAL_COMMUNITY): Payer: Self-pay

## 2023-06-02 ENCOUNTER — Ambulatory Visit: Payer: PPO | Admitting: Family Medicine

## 2023-06-02 NOTE — Telephone Encounter (Signed)
 Reviewed and agree with Dr Macky Lower plan.

## 2023-06-09 ENCOUNTER — Ambulatory Visit: Payer: PPO | Admitting: Family Medicine

## 2023-06-09 NOTE — Progress Notes (Deleted)
? ? ?  SUBJECTIVE:  ? ?CHIEF COMPLAINT / HPI:  ? ? ? ?OBJECTIVE:  ? ?There were no vitals taken for this visit.  ?*** ? ?ASSESSMENT/PLAN:  ? ?No problem-specific Assessment & Plan notes found for this encounter. ?  ? ? ?Caro Laroche, DO ?

## 2023-06-16 ENCOUNTER — Encounter: Payer: Self-pay | Admitting: Family Medicine

## 2023-06-16 ENCOUNTER — Ambulatory Visit (INDEPENDENT_AMBULATORY_CARE_PROVIDER_SITE_OTHER): Payer: PPO | Admitting: Family Medicine

## 2023-06-16 VITALS — BP 136/70 | HR 73 | Ht 72.0 in | Wt 216.8 lb

## 2023-06-16 DIAGNOSIS — I10 Essential (primary) hypertension: Secondary | ICD-10-CM | POA: Diagnosis not present

## 2023-06-16 DIAGNOSIS — Z794 Long term (current) use of insulin: Secondary | ICD-10-CM

## 2023-06-16 DIAGNOSIS — R5383 Other fatigue: Secondary | ICD-10-CM | POA: Diagnosis not present

## 2023-06-16 DIAGNOSIS — E119 Type 2 diabetes mellitus without complications: Secondary | ICD-10-CM | POA: Diagnosis not present

## 2023-06-16 NOTE — Assessment & Plan Note (Signed)
Remains uncontrolled. Resistant to starting GLP1 agonist. Fasting frequently in 300s, increase tresiba. F/u 2 weeks.

## 2023-06-16 NOTE — Patient Instructions (Signed)
It was great to see you!  Our plans for today:  - Increase tresiba to 30 units daily. Keep your meal time insulin (novolog) the same for now.  - Come back in 2 weeks.   We are checking some labs today, we will release these results to your MyChart.  Take care and seek immediate care sooner if you develop any concerns.   Dr. Linwood Dibbles

## 2023-06-16 NOTE — Progress Notes (Signed)
   SUBJECTIVE:   CHIEF COMPLAINT / HPI:   Hypertension: - Medications: coreg, lasix, hydralazine, losartan-hydrochlorothiazide  - Compliance: good - Checking BP at home: no - Denies any SOB, CP, vision changes, LE edema, medication SEs, or symptoms of hypotension  Diabetes, Type 2 - Last A1c 9.0 04/2023 - Medications: ozempic (initiated at last visit), tresiba 30u daily, novolog 15-18u breakfast and lunch, 20u in evening. Previously on trulicity but had trouble affording. Previously on jardiance but was told to stop due to worsened kidney function.  - Compliance: has not gotten ozempic. 24u tresiba. 15u breakfast and lunch, 20u in evening. - Checking BG at home: yes, 250-300s. 2 lows on CGM in last 30 days. Average CBG 287. Time in range 19%. Time above range 79% in last 7 days. - Eye exam: UTD - Foot exam: UTD - Microalbumin: UTD - Statin: yes - PNA vaccine: UTD   OBJECTIVE:   BP 136/70   Pulse 73   Ht 6' (1.829 m)   Wt 216 lb 12.8 oz (98.3 kg)   SpO2 98%   BMI 29.40 kg/m   Gen: well appearing, in NAD Card: Reg rate Lungs: Comfortable WOB on RA Ext: WWP, no edema   ASSESSMENT/PLAN:   HYPERTENSION, BENIGN SYSTEMIC At goal. No changes.  Insulin dependent type 2 diabetes mellitus (HCC) Remains uncontrolled. Resistant to starting GLP1 agonist. Fasting frequently in 300s, increase tresiba. F/u 2 weeks.     Caro Laroche, DO

## 2023-06-16 NOTE — Assessment & Plan Note (Signed)
At goal. No changes.  

## 2023-06-17 ENCOUNTER — Encounter: Payer: Self-pay | Admitting: Family Medicine

## 2023-06-17 LAB — BASIC METABOLIC PANEL
BUN/Creatinine Ratio: 14 (ref 10–24)
BUN: 41 mg/dL — ABNORMAL HIGH (ref 8–27)
CO2: 27 mmol/L (ref 20–29)
Calcium: 9.3 mg/dL (ref 8.6–10.2)
Chloride: 98 mmol/L (ref 96–106)
Creatinine, Ser: 2.93 mg/dL — ABNORMAL HIGH (ref 0.76–1.27)
Glucose: 387 mg/dL — ABNORMAL HIGH (ref 70–99)
Potassium: 4 mmol/L (ref 3.5–5.2)
Sodium: 138 mmol/L (ref 134–144)
eGFR: 22 mL/min/{1.73_m2} — ABNORMAL LOW (ref >59)

## 2023-06-17 LAB — VITAMIN B12: Vitamin B-12: 1748 pg/mL — ABNORMAL HIGH (ref 232–1245)

## 2023-06-19 ENCOUNTER — Other Ambulatory Visit: Payer: Self-pay | Admitting: Family Medicine

## 2023-06-19 DIAGNOSIS — E119 Type 2 diabetes mellitus without complications: Secondary | ICD-10-CM

## 2023-06-19 MED ORDER — NOVOLOG FLEXPEN 100 UNIT/ML ~~LOC~~ SOPN: PEN_INJECTOR | SUBCUTANEOUS | 3 refills | Status: DC

## 2023-06-19 NOTE — Telephone Encounter (Signed)
Spoke with patient in regards to [place] being on back order.   He reports he has adequate supply of Tresiba at this time.   He rpeorts he does need a refill on Novolog.   Will forward to PCP.

## 2023-06-24 ENCOUNTER — Other Ambulatory Visit: Payer: Self-pay | Admitting: Family Medicine

## 2023-06-30 ENCOUNTER — Ambulatory Visit (INDEPENDENT_AMBULATORY_CARE_PROVIDER_SITE_OTHER): Payer: PPO | Admitting: Family Medicine

## 2023-06-30 ENCOUNTER — Encounter: Payer: Self-pay | Admitting: Family Medicine

## 2023-06-30 VITALS — BP 135/62 | HR 72 | Ht 72.0 in | Wt 223.8 lb

## 2023-06-30 DIAGNOSIS — E119 Type 2 diabetes mellitus without complications: Secondary | ICD-10-CM | POA: Diagnosis not present

## 2023-06-30 DIAGNOSIS — Z794 Long term (current) use of insulin: Secondary | ICD-10-CM

## 2023-06-30 DIAGNOSIS — Z860101 Personal history of adenomatous and serrated colon polyps: Secondary | ICD-10-CM | POA: Diagnosis not present

## 2023-06-30 NOTE — Assessment & Plan Note (Signed)
Uncontrolled though better control since increasing tresiba. Fasting sugars in range per CGM though spikes typically after breakfast. No changes to medications today. Recommend low to no carbs at breakfast. Handout provided on diabetic diet. F/u 1 month.

## 2023-06-30 NOTE — Progress Notes (Signed)
   SUBJECTIVE:   CHIEF COMPLAINT / HPI:   Diabetes, Type 2 - Last A1c 9.0 04/2023 - Medications: tresiba 30u daily, novolog 15-18u breakfast and lunch, 20u in evening. Previously on trulicity but had trouble affording. Previously on jardiance but was told to stop due to worsened kidney function. Resistant to retrying GLP1 agonist. - Compliance: good compliance - Checking BG at home: yes, average glucose 231.  - Diet: sometimes eats cereal or toast for breakfast. Today ate boiled egg. - Eye exam: UTD - Foot exam: UTD - Microalbumin: UTD - Statin: yes - PNA vaccine: UTD  HM - due for colonscopy.   OBJECTIVE:   BP 135/62   Pulse 72   Ht 6' (1.829 m)   Wt 223 lb 12.8 oz (101.5 kg)   SpO2 100%   BMI 30.35 kg/m   Gen: well appearing, in NAD Card: Reg rate Lungs: Comfortable WOB on RA Ext: WWP, no edema   ASSESSMENT/PLAN:   Problem List Items Addressed This Visit       Endocrine   Insulin dependent type 2 diabetes mellitus (HCC)   Uncontrolled though better control since increasing tresiba. Fasting sugars in range per CGM though spikes typically after breakfast. No changes to medications today. Recommend low to no carbs at breakfast. Handout provided on diabetic diet. F/u 1 month.       Other Visit Diagnoses       H/O adenomatous polyp of colon    -  Primary   Relevant Orders   Ambulatory referral to Gastroenterology        Caro Laroche, DO

## 2023-06-30 NOTE — Patient Instructions (Addendum)
It was great to see you!  Our plans for today:  - Avoid starchy foods (bread, pasta, rice), especially at breakfast. See below. - No changes to your medications.  - We are referring you to GI doctor. Let us know if you don't hear about an appointment in the next few weeks.   We are checking some labs today, we will release these results to your MyChart.  Take care and seek immediate care sooner if you develop any concerns.   Dr. Linwood Dibbles    Diet Recommendations for Diabetes   1. Eat at least 3 meals and 1-2 snacks per day. Never go more than 4-5 hours while awake without eating. Eat breakfast within the first hour of getting up.   2. Limit starchy foods to TWO per meal and ONE per snack. ONE portion of a starchy  food is equal to the following:   - ONE slice of bread (or its equivalent, such as half of a hamburger bun).   - 1/2 cup of a "scoopable" starchy food such as potatoes or rice.   - 15 grams of Total Carbohydrate as shown on food label.  3. Include at every meal: a protein food, a carb food, and vegetables and/or fruit.   - Obtain twice the volume of vegetables as protein or carbohydrate foods for both lunch and dinner.   - Fresh or frozen vegetables are best.   - Keep frozen vegetables on hand for a quick vegetable serving.       Starchy (carb) foods: Bread, rice, pasta, potatoes, corn, cereal, grits, crackers, bagels, muffins, all baked goods.  (Fruits, milk, and yogurt also have carbohydrate, but most of these foods will not spike your blood sugar as most starchy foods will.)  A few fruits do cause high blood sugars; use small portions of bananas (limit to 1/2 at a time), grapes, watermelon, oranges, and most tropical fruits.    Protein foods: Meat, fish, poultry, eggs, dairy foods, and beans such as pinto and kidney beans (beans also provide carbohydrate).

## 2023-07-04 ENCOUNTER — Telehealth: Payer: Self-pay | Admitting: *Deleted

## 2023-07-04 NOTE — Telephone Encounter (Signed)
    Pre-operative Risk Assessment    Patient Name: Anthony [name]  DOB: 04/30/1951 MRN: [id number]   Last office visit : 11/29/2022  Upcoming upcoming appointment : N/A     Request for Surgical Clearance     Procedure:   Lumbar Epidural Steroid injection (without sedation)   Date of Surgery:  Clearance 08/15/2023                     Surgeon:  Not indicated Surgeon's Group or Practice Name:  [facility]. Phone number:  [phone] Fax number:  [phone]   Type of Clearance Requested:   - Medical  - Pharmacy:  Hold Apixaban  (Eliquis ) Discontinue 3 days prior to procedure/ Resume 24 hours after procedure   Type of Anesthesia:   without sedation   Additional requests/questions:    Bonney Delon Medley, RMA 07/04/2023 4:16 P.M

## 2023-07-07 NOTE — Telephone Encounter (Signed)
 Patient with diagnosis of afib on Eliquis  for anticoagulation.    Procedure: Lumbar Epidural Steroid injection (without sedation)  Date of procedure: 08/15/23   CHA2DS2-VASc Score = 7   This indicates a 11.2% annual risk of stroke. The patient's score is based upon: CHF History: 1 HTN History: 1 Diabetes History: 1 Stroke History: 2 Vascular Disease History: 1 Age Score: 1 Gender Score: 0      CrCl 28 ml/min Platelet count 150  Per office protocol, patient can hold Eliquis  for 3.5 days prior to procedure.    Patient will NOT need bridging with Lovenox  (enoxaparin ) around procedure per Dr. Avanell Bob.  **This guidance is not considered finalized until pre-operative APP has relayed final recommendations.**

## 2023-07-07 NOTE — Telephone Encounter (Signed)
 Left message for patient to call back regarding clearance for upcoming ESI.   Per office protocol, patient can hold Eliquis  for 3.5 days prior to procedure.   Patient will NOT need bridging with Lovenox  (enoxaparin ) around procedure per Dr. Avanell Bob.  Gerldine Koch, NP-C  07/07/2023, 11:59 AM [address] Office [phone] Fax [phone]

## 2023-07-08 NOTE — Progress Notes (Signed)
Remote ICD transmission.

## 2023-07-08 NOTE — Addendum Note (Signed)
Addended by: Elease Etienne A on: 07/08/2023 03:53 PM   Modules accepted: Orders

## 2023-07-23 DIAGNOSIS — N184 Chronic kidney disease, stage 4 (severe): Secondary | ICD-10-CM | POA: Diagnosis not present

## 2023-07-23 DIAGNOSIS — N1832 Chronic kidney disease, stage 3b: Secondary | ICD-10-CM | POA: Diagnosis not present

## 2023-07-23 DIAGNOSIS — N2581 Secondary hyperparathyroidism of renal origin: Secondary | ICD-10-CM | POA: Diagnosis not present

## 2023-07-23 DIAGNOSIS — E1122 Type 2 diabetes mellitus with diabetic chronic kidney disease: Secondary | ICD-10-CM | POA: Diagnosis not present

## 2023-07-23 DIAGNOSIS — N189 Chronic kidney disease, unspecified: Secondary | ICD-10-CM | POA: Diagnosis not present

## 2023-07-23 DIAGNOSIS — I129 Hypertensive chronic kidney disease with stage 1 through stage 4 chronic kidney disease, or unspecified chronic kidney disease: Secondary | ICD-10-CM | POA: Diagnosis not present

## 2023-07-23 DIAGNOSIS — D631 Anemia in chronic kidney disease: Secondary | ICD-10-CM | POA: Diagnosis not present

## 2023-07-24 NOTE — Telephone Encounter (Signed)
1st attempt to reach pt regarding surgical clearance and the need for a tele visit.  Left a message for pt to call back and ask for the preop team. 

## 2023-07-24 NOTE — Telephone Encounter (Signed)
   Name: Anthony [name]  DOB: 1951-03-11  MRN: [id number]  Primary Cardiologist: Dietrich Pates, MD   Preoperative team, please contact this patient and set up a phone call appointment for further preoperative risk assessment. Please obtain consent and complete medication review. Thank you for your help.  I confirm that guidance regarding antiplatelet and oral anticoagulation therapy has been completed and, if necessary, noted below.  Per office protocol, patient can hold Eliquis for 3.5 days prior to procedure.   Patient will NOT need bridging with Lovenox (enoxaparin) around procedure per Dr. Tenny Craw. Please resume Eliquis as soon as possible postprocedure, at the discretion of the surgeon.   I also confirmed the patient resides in the state of [place]. As per [facility] Medical Board telemedicine laws, the patient must reside in the state in which the provider is licensed.   [name], NP 07/24/2023, 2:20 PM [facility]

## 2023-07-25 ENCOUNTER — Telehealth: Payer: Self-pay | Admitting: *Deleted

## 2023-07-25 DIAGNOSIS — N184 Chronic kidney disease, stage 4 (severe): Secondary | ICD-10-CM | POA: Diagnosis not present

## 2023-07-25 NOTE — Telephone Encounter (Signed)
 Pt has been scheduled tele preop appt 08/08/23. Med rec and consent are done.

## 2023-07-25 NOTE — Telephone Encounter (Signed)
 Pt has been scheduled tele preop appt 08/08/23. Med rec and consent are done.     Patient Consent for Virtual Visit        DOC MANDALA has provided verbal consent on 07/25/2023 for a virtual visit (video or telephone).   CONSENT FOR VIRTUAL VISIT FOR:  Anthony [name]  By participating in this virtual visit I agree to the following:  I hereby voluntarily request, consent and authorize [facility] and its employed or contracted physicians, physician assistants, nurse practitioners or other licensed health care professionals (the Practitioner), to provide me with telemedicine health care services (the "Services") as deemed necessary by the treating Practitioner. I acknowledge and consent to receive the Services by the Practitioner via telemedicine. I understand that the telemedicine visit will involve communicating with the Practitioner through live audiovisual communication technology and the disclosure of certain medical information by electronic transmission. I acknowledge that I have been given the opportunity to request an in-person assessment or other available alternative prior to the telemedicine visit and am voluntarily participating in the telemedicine visit.  I understand that I have the right to withhold or withdraw my consent to the use of telemedicine in the course of my care at any time, without affecting my right to future care or treatment, and that the Practitioner or I may terminate the telemedicine visit at any time. I understand that I have the right to inspect all information obtained and/or recorded in the course of the telemedicine visit and may receive copies of available information for a reasonable fee.  I understand that some of the potential risks of receiving the Services via telemedicine include:  Delay or interruption in medical evaluation due to technological equipment failure or disruption; Information transmitted may not be sufficient (e.g. poor  resolution of images) to allow for appropriate medical decision making by the Practitioner; and/or  In rare instances, security protocols could fail, causing a breach of personal health information.  Furthermore, I acknowledge that it is my responsibility to provide information about my medical history, conditions and care that is complete and accurate to the best of my ability. I acknowledge that Practitioner's advice, recommendations, and/or decision may be based on factors not within their control, such as incomplete or inaccurate data provided by me or distortions of diagnostic images or specimens that may result from electronic transmissions. I understand that the practice of medicine is not an exact science and that Practitioner makes no warranties or guarantees regarding treatment outcomes. I acknowledge that a copy of this consent can be made available to me via my patient portal [facility] MyChart), or I can request a printed copy by calling the office of  [facility].    I understand that my insurance will be billed for this visit.   I have read or had this consent read to me. I understand the contents of this consent, which adequately explains the benefits and risks of the Services being provided via telemedicine.  I have been provided ample opportunity to ask questions regarding this consent and the Services and have had my questions answered to my satisfaction. I give my informed consent for the services to be provided through the use of telemedicine in my medical care

## 2023-07-26 LAB — LAB REPORT - SCANNED: EGFR: 31

## 2023-07-28 ENCOUNTER — Encounter: Payer: Self-pay | Admitting: Nephrology

## 2023-08-08 ENCOUNTER — Ambulatory Visit: Payer: PPO | Attending: Cardiovascular Disease

## 2023-08-08 DIAGNOSIS — Z01810 Encounter for preprocedural cardiovascular examination: Secondary | ICD-10-CM

## 2023-08-08 NOTE — Progress Notes (Signed)
 Attempted to contact patient as part of preoperative protocol.  Several attempts were made.  3 voice messages left.  Patient was instructed to contact cardiology office to set up appointment for different date and time.  Thomasene Ripple. Doninique Lwin NP-C     08/08/2023, 4:40 PM [facility] [address] Office [phone] Fax [phone]

## 2023-08-14 ENCOUNTER — Telehealth: Payer: Self-pay | Admitting: Family Medicine

## 2023-08-14 NOTE — Telephone Encounter (Signed)
 Patient was identified as falling into the [place] Measure - Diabetes.   Patient was: Appointment scheduled with primary care provider in the next 30 days.

## 2023-08-18 ENCOUNTER — Other Ambulatory Visit: Payer: Self-pay

## 2023-08-18 DIAGNOSIS — I428 Other cardiomyopathies: Secondary | ICD-10-CM

## 2023-08-18 DIAGNOSIS — I639 Cerebral infarction, unspecified: Secondary | ICD-10-CM

## 2023-08-18 DIAGNOSIS — R0609 Other forms of dyspnea: Secondary | ICD-10-CM

## 2023-08-18 DIAGNOSIS — Z95810 Presence of automatic (implantable) cardiac defibrillator: Secondary | ICD-10-CM

## 2023-08-18 DIAGNOSIS — I5032 Chronic diastolic (congestive) heart failure: Secondary | ICD-10-CM

## 2023-08-18 DIAGNOSIS — I5022 Chronic systolic (congestive) heart failure: Secondary | ICD-10-CM

## 2023-08-18 DIAGNOSIS — I2510 Atherosclerotic heart disease of native coronary artery without angina pectoris: Secondary | ICD-10-CM

## 2023-08-18 DIAGNOSIS — I10 Essential (primary) hypertension: Secondary | ICD-10-CM

## 2023-08-18 DIAGNOSIS — E782 Mixed hyperlipidemia: Secondary | ICD-10-CM

## 2023-08-18 DIAGNOSIS — I4821 Permanent atrial fibrillation: Secondary | ICD-10-CM

## 2023-08-18 DIAGNOSIS — R931 Abnormal findings on diagnostic imaging of heart and coronary circulation: Secondary | ICD-10-CM

## 2023-08-18 MED ORDER — ROSUVASTATIN CALCIUM 20 MG PO TABS: 20.0000 mg | ORAL_TABLET | Freq: Every day | ORAL | 0 refills | Status: DC

## 2023-08-21 ENCOUNTER — Ambulatory Visit: Payer: PPO

## 2023-08-21 VITALS — Ht 71.0 in | Wt 214.0 lb

## 2023-08-21 DIAGNOSIS — Z0000 Encounter for general adult medical examination without abnormal findings: Secondary | ICD-10-CM | POA: Diagnosis not present

## 2023-08-21 NOTE — Patient Instructions (Addendum)
 [name] , Thank you for taking time to come for your Medicare Wellness Visit. I appreciate your ongoing commitment to your health goals. Please review the following plan we discussed and let me know if I can assist you in the future.   Referrals/Orders/Follow-Ups/Clinician Recommendations: Yes; Keep maintaining your health by keeping your appointments with Dr. Randye Lobo and any specialists that you may see.  Call us if you need anything.  Have a great year!!!!  This is a list of the screening recommended for you and due dates:  Health Maintenance  Topic Date Due   Colon Cancer Screening  02/07/2022   COVID-19 Vaccine (7 - 2024-25 season) 01/26/2023   Yearly kidney health urinalysis for diabetes  09/06/2023   Eye exam for diabetics  09/11/2023   Complete foot exam   10/11/2023   DTaP/Tdap/Td vaccine (3 - Td or Tdap) 10/27/2023   Hemoglobin A1C  10/31/2023   Yearly kidney function blood test for diabetes  07/25/2024   Medicare Annual Wellness Visit  08/20/2024   Pneumonia Vaccine  Completed   Flu Shot  Completed   Hepatitis C Screening  Completed   Zoster (Shingles) Vaccine  Completed   HPV Vaccine  Aged Out    Advanced directives: (In Chart) A copy of your advanced directives are scanned into your chart should your provider ever need it.  Next Medicare Annual Wellness Visit scheduled for next year: Yes

## 2023-08-21 NOTE — Progress Notes (Signed)
 Because this visit was a virtual/telehealth visit,  certain criteria was not obtained, such a blood pressure, CBG if applicable, and timed get up and go. Any medications not marked as "taking" were not mentioned during the medication reconciliation part of the visit. Any vitals not documented were not able to be obtained due to this being a telehealth visit or patient was unable to self-report a recent blood pressure reading due to a lack of equipment at home via telehealth. Vitals that have been documented are verbally provided by the patient.   Subjective:   Anthony [name] is a 73 y.o. who presents for a Medicare Wellness preventive visit.  Visit Complete: Virtual I connected with  Anthony [name] on 08/21/23 by a audio enabled telemedicine application and verified that I am speaking with the correct person using two identifiers.  Patient Location: Home  Provider Location: Office/Clinic  I discussed the limitations of evaluation and management by telemedicine. The patient expressed understanding and agreed to proceed.  Vital Signs: Because this visit was a virtual/telehealth visit, some criteria may be missing or patient reported. Any vitals not documented were not able to be obtained and vitals that have been documented are patient reported.  VideoDeclined- This patient declined Librarian, academic. Therefore the visit was completed with audio only.  Persons Participating in Visit: Patient.  AWV Questionnaire: No: Patient Medicare AWV questionnaire was not completed prior to this visit.  Cardiac Risk Factors include: advanced age (>17men, >62 women);diabetes mellitus;hypertension;family history of premature cardiovascular disease;male gender     Objective:    Today's Vitals   08/21/23 1452  Weight: 214 lb (97.1 kg)  Height: 5\' 11"  (1.803 m)  PainSc: 0-No pain   Body mass index is 29.85 kg/m.     08/21/2023    2:55 PM 06/30/2023   10:57 AM  06/16/2023   11:09 AM 05/02/2023    9:24 AM 01/28/2023   12:04 PM 10/18/2022    9:45 AM 09/06/2022   10:01 AM  Advanced Directives  Does Patient Have a Medical Advance Directive? Yes No No No No No No  Type of Estate agent of [place];Living will        Does patient want to make changes to medical advance directive? No - Patient declined        Copy of Healthcare Power of Attorney in Chart? Yes - validated most recent copy scanned in chart (See row information)        Would patient like information on creating a medical advance directive?    No - Patient declined  Yes (MAU/Ambulatory/Procedural Areas - Information given) Yes (MAU/Ambulatory/Procedural Areas - Information given)    Current Medications (verified) Outpatient Encounter Medications as of 08/21/2023  Medication Sig   acetaminophen (TYLENOL) 650 MG CR tablet Take 1,300 mg by mouth 2 (two) times daily as needed for pain.   apixaban (ELIQUIS) 5 MG TABS tablet Take 1 tablet (5 mg total) by mouth 2 (two) times daily.   calcitRIOL (ROCALTROL) 0.5 MCG capsule Take 0.5 mcg by mouth daily.   carvedilol (COREG) 25 MG tablet TAKE 1 TABLET 2 TIMES DAILY.   Continuous Glucose Sensor (FREESTYLE LIBRE 3 SENSOR) MISC Place 1 sensor on the skin every 14 days. Use to check glucose continuously   diclofenac Sodium (VOLTAREN) 1 % GEL Apply 4 g topically 4 (four) times daily.   furosemide (LASIX) 20 MG tablet TAKE 3 TABLETS BY MOUTH EVERY DAY   gabapentin (NEURONTIN) 300  MG capsule Take 1 capsule (300 mg total) by mouth 3 (three) times daily.   hydrALAZINE (APRESOLINE) 50 MG tablet Take 1 tablet (50 mg total) by mouth 3 (three) times daily.   insulin aspart (NOVOLOG FLEXPEN) 100 UNIT/ML FlexPen INJECT INTO THE SKIN THREE TIMES DAILY BEFORE MEALS. INJECT 8-10 UNITS BEFORE BREAKFAST AND LUNCH AND INJECT 15-20 UNITS BEFORE DINNER.   insulin degludec (TRESIBA FLEXTOUCH) 200 UNIT/ML FlexTouch Pen Inject 26 Units into the skin daily.    Insulin Pen Needle (PEN NEEDLES) 32G X 4 MM MISC 1 Device by Does not apply route as directed.   Lancets (ONETOUCH ULTRASOFT) lancets Use as instructed   latanoprost (XALATAN) 0.005 % ophthalmic solution Place 1 drop into both eyes at bedtime.   loratadine (CLARITIN) 10 MG tablet Take 10 mg by mouth daily as needed for allergies.   losartan-hydrochlorothiazide (HYZAAR) 50-12.5 MG tablet Take 1 tablet by mouth daily.   Multiple Vitamin (MULTIVITAMIN) tablet Take 1 tablet by mouth daily.     nitroGLYCERIN (NITROSTAT) 0.4 MG SL tablet Place 1 tablet (0.4 mg total) under the tongue every 5 (five) minutes as needed for chest pain. If no relief by 3rd tab, call 911   omeprazole (PRILOSEC) 40 MG capsule TAKE ONE CAPSULE BY MOUTH DAILY BEFORE SUPPER   Oxycodone HCl 10 MG TABS Take 10 mg by mouth 3 (three) times daily.   PARoxetine (PAXIL) 30 MG tablet TAKE 1 TABLET BY MOUTH EVERY DAY   potassium chloride SA (KLOR-CON M20) 20 MEQ tablet Take 1 tablet (20 mEq total) by mouth daily.   rosuvastatin (CRESTOR) 20 MG tablet Take 1 tablet (20 mg total) by mouth daily.   tamsulosin (FLOMAX) 0.4 MG CAPS capsule TAKE 1 CAPSULE BY MOUTH EVERYDAY AT BEDTIME   No facility-administered encounter medications on file as of 08/21/2023.    Allergies (verified) Enalapril maleate   History: Past Medical History:  Diagnosis Date   Arthritis    Benign neoplasm of descending colon    Benign neoplasm of sigmoid colon    Benign neoplasm of transverse colon    CAD in native artery    a. reported MI 2000, 2001, s/p stenting of the circumflex lesion extending into the second obtuse marginal branch with a drug-eluting stent in 2003. b. low risk nuc 2015.   Chronic atrial fibrillation (HCC)    Chronic combined systolic and diastolic CHF (congestive heart failure) (HCC)    a. Previously diastolic, then EF 25-30% in 10/2016.   CKD (chronic kidney disease), stage III (HCC)    Depression    Diabetes mellitus    Edema of  extremities    Erectile dysfunction    GERD (gastroesophageal reflux disease)    Hyperlipidemia    Hypertension    Myocardial infarction (HCC) 2000&2001   Obesity    Onychomycosis    OSA (obstructive sleep apnea)    S/P ICD (internal cardiac defibrillator) procedure and BiV device,  07/25/17 Medtronic  07/26/2017   Seizures (HCC)    Sleep apnea    Stroke (HCC)    Tubular adenoma of colon 10/2002   Past Surgical History:  Procedure Laterality Date   ANGIOPLASTY  2001   stent x 1   AV NODE ABLATION N/A 08/20/2017   Procedure: AV NODE ABLATION;  Surgeon: Duke Salvia, MD;  [facility] INVAS[facility] CV LAB;  Service: Cardiovascular;  Laterality: N/A;   BIV ICD GENERATOR CHANGEOUT N/A 05/17/2022   Procedure: BIV ICD GENERATOR CHANGEOUT;  Surgeon: Duke Salvia,  MD;  Location: [facility] INVASIVE CV LAB;  Service: Cardiovascular;  Laterality: N/A;   BIV ICD INSERTION CRT-D N/A 07/25/2017   Procedure: BIV ICD INSERTION CRT-D;  Surgeon: Duke Salvia, MD;  Location[facility]AS[facility] CV LAB;  Service: Cardiovascular;  Laterality: N/A;   COLONOSCOPY  2000?   negative   COLONOSCOPY WITH PROPOFOL N/A 02/08/2019   Procedure: COLONOSCOPY WITH PROPOFOL;  Surgeon: Meryl Dare, MD;  Location: [facility] ENDOSCOPY;  Service: Endoscopy;  Laterality: N/A;   FOOT ARTHROTOMY Right    POLYPECTOMY  02/08/2019   Procedure: POLYPECTOMY;  Surgeon: Meryl Dare, MD;  Location: [facility] ENDOSCOPY;  Service: Endoscopy;;   TOE AMPUTATION Left 2012   Family History  Problem Relation Age of Onset   Heart attack Mother    CVA Mother    Diabetes Mother    Heart attack Father    Colon cancer Neg Hx    Stomach cancer Neg Hx    Social History   Socioeconomic History   Marital status: Married    Spouse name: Angelique Blonder   Number of children: 2   Years of education: GED   Highest education level: Not on file  Occupational History   Occupation: CUSTOMER SERVICES    Employer: [employer]    Comment: [employer]  Tobacco Use   Smoking status:  Former    Current packs/day: 0.00    Average packs/day: 0.3 packs/day for 51.4 years (12.9 ttl pk-yrs)    Types: Cigarettes    Start date: 05/28/1967    Quit date: 10/28/2018    Years since quitting: 4.8    Passive exposure: Past   Smokeless tobacco: Never  Vaping Use   Vaping status: Never Used  Substance and Sexual Activity   Alcohol use: No    Comment: quit in 1993   Drug use: No   Sexual activity: Not on file  Other Topics Concern   Not on file  Social History Narrative   Patient is right handed, consumes caffeine rarely. Patient resides in home with wife.   Social Drivers of Corporate investment banker Strain: Low Risk  (08/21/2023)   Overall Financial Resource Strain (CARDIA)    Difficulty of Paying Living Expenses: Not very hard  Food Insecurity: No Food Insecurity (08/21/2023)   Hunger Vital Sign    Worried About Running Out of Food in the Last Year: Never true    Ran Out of Food in the Last Year: Never true  Transportation Needs: No Transportation Needs (08/21/2023)   PRAPARE - Administrator, Civil Service (Medical): No    Lack of Transportation (Non-Medical): No  Physical Activity: Sufficiently Active (08/21/2023)   Exercise Vital Sign    Days of Exercise per Week: 5 days    Minutes of Exercise per Session: 30 min  Stress: No Stress Concern Present (08/21/2023)   [employer] of Occupational Health - Occupational Stress Questionnaire    Feeling of Stress : Not at all  Social Connections: Moderately Integrated (08/21/2023)   Social Connection and Isolation Panel [NHANES]    Frequency of Communication with Friends and Family: More than three times a week    Frequency of Social Gatherings with Friends and Family: Three times a week    Attends Religious Services: More than 4 times per year    Active Member of Clubs or Organizations: No    Attends Banker Meetings: Never    Marital Status: Married    Tobacco Counseling Counseling given: Not  Answered    Clinical Intake:  Pre-visit preparation completed: Yes  Pain : No/denies pain Pain Score: 0-No pain     BMI - recorded: 29.85 Nutritional Status: BMI 25 -29 Overweight Nutritional Risks: None Diabetes: Yes CBG done?: No Did pt. bring in CBG monitor from home?: No  Lab Results  Component Value Date   HGBA1C 9.0 (A) 05/02/2023   HGBA1C 9.2 (H) 01/28/2023   HGBA1C 10.2 (H) 08/22/2022     How often do you need to have someone help you when you read instructions, pamphlets, or other written materials from your doctor or pharmacy?: 1 - Never  Interpreter Needed?: No  Information entered by :: Zipporah Finamore N. Jestin Burbach, LPN.   Activities of Daily Living     08/21/2023    2:57 PM 10/18/2022    9:45 AM  In your present state of health, do you have any difficulty performing the following activities:  Hearing? 0 0  Vision? 0 0  Difficulty concentrating or making decisions? 0 0  Walking or climbing stairs? 0 0  Dressing or bathing? 0 0  Doing errands, shopping? 0 0  Preparing Food and eating ? N N  Using the Toilet? N N  In the past six months, have you accidently leaked urine? N N  Do you have problems with loss of bowel control? N N  Managing your Medications? N N  Managing your Finances? N N  Housekeeping or managing your Housekeeping? N N    Patient Care Team: Caro Laroche, DO as PCP - General (Family Medicine) Duke Salvia, MD as PCP - Electrophysiology (Cardiology) Pricilla Riffle, MD as PCP - Cardiology (Cardiology) Callie Fielding, MD as Consulting Physician (Physical Medicine and Rehabilitation) Camille Bal, MD as Consulting Physician (Nephrology) [facility], [facility] Maris Berger, MD as Consulting Physician (Ophthalmology)  Indicate any recent Medical Services you may have received from other than [facility] providers in the past year (date may be approximate).     Assessment:   This is a routine wellness examination for  Anthony [name].  Hearing/Vision screen Hearing Screening - Comments:: Denies hearing difficulties. No hearing aids.    Vision Screening - Comments:: Wears rx glasses - up to date with routine eye exams with Maris Berger, MD.    Goals Addressed   None    Depression Screen     08/21/2023    2:56 PM 06/30/2023   10:56 AM 06/16/2023   11:09 AM 05/02/2023    9:30 AM 01/28/2023   12:04 PM 10/18/2022    9:43 AM 10/11/2022   10:08 AM  PHQ 2/9 Scores  PHQ - 2 Score 0 0 0 0 0 0 0  PHQ- 9 Score 0 0 0 0 0 0 0    Fall Risk     08/21/2023    2:55 PM 05/02/2023    9:24 AM 01/28/2023   12:04 PM 10/18/2022    9:42 AM 10/11/2022   10:08 AM  Fall Risk   Falls in the past year? 0 0 0 0 0  Number falls in past yr: 0 0  0   Injury with Fall? 0 0  0   Risk for fall due to : No Fall Risks   No Fall Risks   Follow up Falls prevention discussed;Falls evaluation completed   Falls prevention discussed;Education provided;Falls evaluation completed     MEDICARE RISK AT HOME:  Medicare Risk at Home Any stairs in or around the home?: No [facility]) If so, are  there any without handrails?: No Home free of loose throw rugs in walkways, pet beds, electrical cords, etc?: Yes Adequate lighting in your home to reduce risk of falls?: Yes Life alert?: No Use of a cane, walker or w/c?: Yes Grab bars in the bathroom?: No Shower chair or bench in shower?: Yes Elevated toilet seat or a handicapped toilet?: No  TIMED UP AND GO:  Was the test performed?  No  Cognitive Function: 6CIT completed    08/21/2023    2:56 PM  MMSE - Mini Mental State Exam  Not completed: Unable to complete        08/21/2023    2:56 PM 10/18/2022    9:45 AM 10/12/2018    3:34 PM  6CIT Screen  What Year? 0 points 0 points 0 points  What month? 0 points 0 points 0 points  What time? 0 points 0 points 0 points  Count back from 20 0 points 0 points 0 points  Months in reverse 0 points 0 points 2 points  Repeat phrase 0 points 2  points 0 points  Total Score 0 points 2 points 2 points    Immunizations Immunization History  Administered Date(s) Administered   Fluad Quad(high Dose 65+) 02/05/2019, 02/27/2021, 03/13/2022   Influenza Split 02/28/2011, 02/26/2012   Influenza Whole 03/09/2007, 02/25/2008, 03/30/2009, 06/11/2010   Influenza, High Dose Seasonal PF 03/10/2017   Influenza, Seasonal, Injecte, Preservative Fre 01/28/2023   Influenza,inj,Quad PF,6+ Mos 03/08/2013, 02/23/2014, 02/15/2015, 04/05/2016   Influenza-Unspecified 03/24/2017, 02/16/2019, 01/28/2023   PFIZER Comirnaty(Gray Top)Covid-19 Tri-Sucrose Vaccine 12/27/2020   PFIZER(Purple Top)SARS-COV-2 Vaccination 07/19/2019, 08/09/2019, 04/26/2020, 12/27/2020   Pfizer Covid-19 Vaccine Bivalent Booster 64yrs & up 06/04/2021   Pfizer(Comirnaty)Fall Seasonal Vaccine 12 years and older 03/28/2022   Pneumococcal Conjugate-13 10/26/2013   Pneumococcal Polysaccharide-23 03/27/2004, 07/02/2012, 03/05/2018   RSV,unspecified 04/19/2022   Respiratory Syncytial Virus Vaccine,Recomb Aduvanted(Arexvy) 04/19/2022   Td 11/24/2001   Tdap 10/26/2013   Zoster Recombinant(Shingrix) 12/28/2018, 12/03/2022    Screening Tests Health Maintenance  Topic Date Due   Colonoscopy  02/07/2022   COVID-19 Vaccine (7 - 2024-25 season) 01/26/2023   Diabetic kidney evaluation - Urine ACR  09/06/2023   OPHTHALMOLOGY EXAM  09/11/2023   FOOT EXAM  10/11/2023   DTaP/Tdap/Td (3 - Td or Tdap) 10/27/2023   HEMOGLOBIN A1C  10/31/2023   Diabetic kidney evaluation - eGFR measurement  07/25/2024   Medicare Annual Wellness (AWV)  08/20/2024   Pneumonia Vaccine 59+ Years old  Completed   INFLUENZA VACCINE  Completed   Hepatitis C Screening  Completed   Zoster Vaccines- Shingrix  Completed   HPV VACCINES  Aged Out    Health Maintenance  Health Maintenance Due  Topic Date Due   Colonoscopy  02/07/2022   COVID-19 Vaccine (7 - 2024-25 season) 01/26/2023   Diabetic kidney evaluation -  Urine ACR  09/06/2023   Health Maintenance Items Addressed: Yes Patient is overdue for Colonoscopy, Covid Vaccine and Urine ACR.  Additional Screening:  Vision Screening: Recommended annual ophthalmology exams for early detection of glaucoma and other disorders of the eye.  Dental Screening: Recommended annual dental exams for proper oral hygiene  Community Resource Referral / Chronic Care Management: CRR required this visit?  No   CCM required this visit?  No     Plan:     I have personally reviewed and noted the following in the patient's chart:   Medical and social history Use of alcohol, tobacco or illicit drugs  Current medications and supplements  including opioid prescriptions. Patient is currently taking opioid prescriptions. Information provided to patient regarding non-opioid alternatives. Patient advised to discuss non-opioid treatment plan with their provider. Functional ability and status Nutritional status Physical activity Advanced directives List of other physicians Hospitalizations, surgeries, and ER visits in previous 12 months Vitals Screenings to include cognitive, depression, and falls Referrals and appointments  In addition, I have reviewed and discussed with patient certain preventive protocols, quality metrics, and best practice recommendations. A written personalized care plan for preventive services as well as general preventive health recommendations were provided to patient.     Mickeal Needy, LPN   08/26/270   After Visit Summary: (Declined) Due to this being a telephonic visit, with patients personalized plan was offered to patient but patient Declined AVS at this time   Notes: Please refer to Routing Comments.

## 2023-08-25 ENCOUNTER — Ambulatory Visit: Admitting: Family Medicine

## 2023-08-29 ENCOUNTER — Ambulatory Visit (INDEPENDENT_AMBULATORY_CARE_PROVIDER_SITE_OTHER): Payer: PPO

## 2023-08-29 DIAGNOSIS — I428 Other cardiomyopathies: Secondary | ICD-10-CM | POA: Diagnosis not present

## 2023-08-30 LAB — CUP PACEART REMOTE DEVICE CHECK
Battery Remaining Longevity: 72 mo
Battery Remaining Percentage: 82 %
Brady Statistic RA Percent Paced: 0 %
Brady Statistic RV Percent Paced: 98 %
Date Time Interrogation Session: 20250404030100
HighPow Impedance: 64 Ohm
Implantable Lead Connection Status: 753985
Implantable Lead Connection Status: 753985
Implantable Lead Implant Date: 20190301
Implantable Lead Implant Date: 20190301
Implantable Lead Location: 753858
Implantable Lead Location: 753860
Implantable Pulse Generator Implant Date: 20231222
Lead Channel Impedance Value: 573 Ohm
Lead Channel Impedance Value: 711 Ohm
Lead Channel Pacing Threshold Amplitude: 0.6 V
Lead Channel Pacing Threshold Pulse Width: 0.4 ms
Lead Channel Setting Pacing Amplitude: 2 V
Lead Channel Setting Pacing Amplitude: 4 V
Lead Channel Setting Pacing Pulse Width: 0.4 ms
Lead Channel Setting Pacing Pulse Width: 1.5 ms
Lead Channel Setting Sensing Sensitivity: 0.6 mV
Lead Channel Setting Sensing Sensitivity: 1 mV
Pulse Gen Serial Number: 285896

## 2023-09-01 ENCOUNTER — Ambulatory Visit (INDEPENDENT_AMBULATORY_CARE_PROVIDER_SITE_OTHER): Admitting: Family Medicine

## 2023-09-01 ENCOUNTER — Encounter: Payer: Self-pay | Admitting: Family Medicine

## 2023-09-01 DIAGNOSIS — E119 Type 2 diabetes mellitus without complications: Secondary | ICD-10-CM | POA: Diagnosis not present

## 2023-09-01 DIAGNOSIS — Z794 Long term (current) use of insulin: Secondary | ICD-10-CM | POA: Diagnosis not present

## 2023-09-01 LAB — POCT GLYCOSYLATED HEMOGLOBIN (HGB A1C): HbA1c, POC (controlled diabetic range): 9.2 % — AB (ref 0.0–7.0)

## 2023-09-01 MED ORDER — TRESIBA FLEXTOUCH 200 UNIT/ML ~~LOC~~ SOPN: 30.0000 [IU] | PEN_INJECTOR | Freq: Every day | SUBCUTANEOUS | Status: DC

## 2023-09-01 NOTE — Patient Instructions (Addendum)
 It was great to see you!  Our plans for today:  - Call GI to schedule an appointment for colonosocpy. [facility] [address] [place] [phone] - See below for eating with diabetes. - We are referring you to nutrition. Let us know if you don't hear about an appointment in the next few weeks.  - Come to your appointments with Dr. Raymondo Band next week and me next month.    We are checking some labs today, we will release these results to your MyChart.  Take care and seek immediate care sooner if you develop any concerns.   Dr. Linwood Dibbles    Diet Recommendations for Diabetes   1. Eat at least 3 meals and 1-2 snacks per day. Never go more than 4-5 hours while awake without eating. Eat breakfast within the first hour of getting up.   2. Limit starchy foods to TWO per meal and ONE per snack. ONE portion of a starchy  food is equal to the following:   - ONE slice of bread (or its equivalent, such as half of a hamburger bun).   - 1/2 cup of a "scoopable" starchy food such as potatoes or rice.   - 15 grams of Total Carbohydrate as shown on food label.  3. Include at every meal: a protein food, a carb food, and vegetables and/or fruit.   - Obtain twice the volume of vegetables as protein or carbohydrate foods for both lunch and dinner.   - Fresh or frozen vegetables are best.   - Keep frozen vegetables on hand for a quick vegetable serving.       Starchy (carb) foods: Bread, rice, pasta, potatoes, corn, cereal, grits, crackers, bagels, muffins, all baked goods.  (Fruits, milk, and yogurt also have carbohydrate, but most of these foods will not spike your blood sugar as most starchy foods will.)  A few fruits do cause high blood sugars; use small portions of bananas (limit to 1/2 at a time), grapes, watermelon, oranges, and most tropical fruits.    Protein foods: Meat, fish, poultry, eggs, dairy foods, and beans such as pinto and kidney beans (beans also provide carbohydrate).    Here is  an example of what a healthy plate looks like:    ? Make half your plate fruits and vegetables.     ? Focus on whole fruits.     ? Vary your veggies.  ? Make half your grains whole grains. -     ? Look for the word "whole" at the beginning of the ingredients list    ? Some whole-grain ingredients include whole oats, whole-wheat flour,        whole-grain corn, whole-grain brown rice, and whole rye.  ? Move to low-fat and fat-free milk or yogurt.  ? Vary your protein routine. - Meat, fish, poultry (chicken, [place]), eggs, beans (kidney, pinto), dairy.  ? Drink and eat less sodium, saturated fat, and added sugars.

## 2023-09-01 NOTE — Progress Notes (Signed)
   SUBJECTIVE:   CHIEF COMPLAINT / HPI:   Diabetes, Type 2 - Last A1c 9.0 04/2023 - Medications: tresiba 30u daily, novolog 15-18u breakfast and lunch, 20u in evening. Previously on trulicity but had trouble affording. Previously on jardiance but was told to stop due to worsened kidney function. Resistant to retrying GLP1 agonist. - Compliance: 8-12u novolog. No missed doses.  - Checking BG at home: yes, on CGM. 30% in range, 2% below, 68% above range. Average glucose 230s. - Diet: cereal Cheerios, sometimes banana. Post-dinner snack around 9pm peanut butter sandwich or crackers. Dinner usually between [employer] and cheese, greens, fried chicken last night - at last visit recommended low to no carbs at breakfast due to spikes after breakfast noted on CGM. Today reports sometimes doesn't eat breakfast, if he does it's typically regular cheerios or raisin bran cereal, maybe sometimes with banana.  - Eye exam: UTD - Foot exam: UTD - Microalbumin: UTD - Statin: yes - PNA vaccine: UTD  HM - referred for colonoscopy, hasn't heard about appointment yet.   OBJECTIVE:   BP (!) 151/87   Pulse 70   Ht 6' (1.829 m)   Wt 217 lb 6.4 oz (98.6 kg)   SpO2 100%   BMI 29.48 kg/m   Gen: well appearing, in NAD Card: Reg rate Lungs: Comfortable WOB on RA Ext: WWP, no edema   ASSESSMENT/PLAN:   Insulin dependent type 2 diabetes mellitus (HCC) Remains uncontrolled, A1c 9.2 today. Suspect biggest barrier to control is diet as often spikes after meals. Given novolog does rapidly decrease blood sugars with some infrequent lows, hesitate to increase insulin regimen. Resistant to retrying GLP1 agonist. Referred to dietician and pharmacy today. Counseled on diet. F/u with me 4 weeks.    Gave phone number to call to schedule colonoscopy.  Caro Laroche, DO

## 2023-09-01 NOTE — Assessment & Plan Note (Addendum)
 Remains uncontrolled, A1c 9.2 today. Suspect biggest barrier to control is diet as often spikes after meals. Given novolog does rapidly decrease blood sugars with some infrequent lows, hesitate to increase insulin regimen. Resistant to retrying GLP1 agonist. Referred to dietician and pharmacy today. Counseled on diet. F/u with me 4 weeks.

## 2023-09-02 ENCOUNTER — Encounter: Payer: Self-pay | Admitting: Family Medicine

## 2023-09-02 LAB — BASIC METABOLIC PANEL WITH GFR
BUN/Creatinine Ratio: 14 (ref 10–24)
BUN: 38 mg/dL — ABNORMAL HIGH (ref 8–27)
CO2: 28 mmol/L (ref 20–29)
Calcium: 9.4 mg/dL (ref 8.6–10.2)
Chloride: 99 mmol/L (ref 96–106)
Creatinine, Ser: 2.71 mg/dL — ABNORMAL HIGH (ref 0.76–1.27)
Glucose: 167 mg/dL — ABNORMAL HIGH (ref 70–99)
Potassium: 4.7 mmol/L (ref 3.5–5.2)
Sodium: 141 mmol/L (ref 134–144)
eGFR: 24 mL/min/{1.73_m2} — ABNORMAL LOW (ref >59)

## 2023-09-09 ENCOUNTER — Ambulatory Visit: Admitting: Pharmacist

## 2023-09-09 ENCOUNTER — Encounter: Payer: Self-pay | Admitting: Pharmacist

## 2023-09-09 VITALS — BP 115/58 | HR 70 | Wt 214.6 lb

## 2023-09-09 DIAGNOSIS — Z794 Long term (current) use of insulin: Secondary | ICD-10-CM

## 2023-09-09 DIAGNOSIS — E119 Type 2 diabetes mellitus without complications: Secondary | ICD-10-CM | POA: Diagnosis not present

## 2023-09-09 DIAGNOSIS — I4821 Permanent atrial fibrillation: Secondary | ICD-10-CM | POA: Diagnosis not present

## 2023-09-09 MED ORDER — APIXABAN 5 MG PO TABS
5.0000 mg | ORAL_TABLET | Freq: Two times a day (BID) | ORAL | 5 refills | Status: DC
Start: 2023-09-09 — End: 2024-04-13

## 2023-09-09 MED ORDER — APIXABAN 5 MG PO TABS: 5.0000 mg | ORAL_TABLET | Freq: Two times a day (BID) | ORAL | 0 refills | Status: DC

## 2023-09-09 MED ORDER — APIXABAN 5 MG PO TABS
5.0000 mg | ORAL_TABLET | Freq: Two times a day (BID) | ORAL | Status: DC
Start: 2023-09-09 — End: 2023-09-09

## 2023-09-09 MED ORDER — POTASSIUM CHLORIDE CRYS ER 20 MEQ PO TBCR
20.0000 meq | EXTENDED_RELEASE_TABLET | Freq: Every day | ORAL | 3 refills | Status: DC
Start: 2023-09-09 — End: 2024-01-31

## 2023-09-09 NOTE — Patient Instructions (Signed)
 It was nice to see you today!  Your goal blood sugar is 80-130 before eating and less than 180 after eating.  Medication Changes: Increase Novolog (insulin aspart) from 15-18 units to 15-20 units with meals. Use higher range of dose (up to 20 units) with larger meals, primarily with dinner.  Continue all other medication the same.   Keep up the good work with diet and exercise. Aim for a diet full of vegetables, fruit and lean meats (chicken, [place], fish). Try to limit salt intake by eating fresh or frozen vegetables (instead of canned), rinse canned vegetables prior to cooking and do not add any additional salt to meals.

## 2023-09-09 NOTE — Assessment & Plan Note (Signed)
 Diabetes longstanding 17 years (2008) currently uncontrolled. Patient is able to verbalize appropriate hypoglycemia management plan. Medication adherence appears good. Control is suboptimal due to high variability as seen in report. -Continued basal insulin Tresiba (insulin degludec) 30 units daily in the morning -Increased dose of rapid insulin Novolog (insulin aspart) from 15-18 units to 15-20 units twice daily with meals.   -Patient educated on purpose, proper use, and potential adverse effects.  -Extensively discussed pathophysiology of diabetes, recommended lifestyle interventions, dietary effects on blood sugar control.  -Counseled on s/sx of and management of hypoglycemia.

## 2023-09-09 NOTE — Progress Notes (Signed)
 S:     Chief Complaint  Patient presents with   Medication Management    Diabetes follow-up   73 y.o. male who presents for diabetes evaluation, education, and management. Patient arrives in good spirits and presents with a cane.   Patient was referred and last seen by Primary Care Provider, Dr. Linwood Dibbles, on 09/01/23.   PMH is significant for HTN, T2DM, AFib, CKD stage 4, HF (NYHA class 3), CVA, diabetic retinopathy, peripheral neuropathy, hypercholesterolemia, seizures. At last visit, patient counseled on dietary lifestyle changes and referred to dietician and pharmacy for follow-up.   Current diabetes medications include: Tresiba (insulin degludec) 30 units daily, Novolog (insulin aspart) 15-18 units BID with meals Current hypertension medications include: furosemide 20 mg, hydralazine 50 mg, losartan-hydrochlorothiazide 50-12.5 mg Current hyperlipidemia medications include: rosuvastatin 20 mg  Patient reports adherence to taking all medications as prescribed.   Do you feel that your medications are working for you? yes Have you been experiencing any side effects to the medications prescribed? no Do you have any problems obtaining medications due to transportation or finances? no Insurance coverage: [employer]  Patient reports hypoglycemic events. Report revealed 2-3 hypoglycemic events occurring midday or overnight.  O:   Review of Systems  All other systems reviewed and are negative.   Physical Exam Vitals reviewed.  Constitutional:      Appearance: Normal appearance.  Pulmonary:     Effort: Pulmonary effort is normal.  Neurological:     Mental Status: He is alert.  Psychiatric:        Mood and Affect: Mood normal.        Behavior: Behavior normal.        Thought Content: Thought content normal.        Judgment: Judgment normal.    Libre3 CGM Download today 09/09/23 % Time CGM is active: 98% Average Glucose: 192 mg/dL Glucose Management Indicator:  7.9  Glucose Variability: 41.1% (goal <36%) Time in Goal:  - Time in range 70-180: 49% - Time above range: 47% - Time below range: 4% Observed patterns: High variability, reveals 3 hypoglycemic events occurring midday or overnight, with hyperglycemic events spiking during mealtime   Lab Results  Component Value Date   HGBA1C 9.2 (A) 09/01/2023   Vitals:   09/09/23 1538  BP: (!) 115/58  Pulse: 70  SpO2: 100%    Lipid Panel     Component Value Date/Time   CHOL 146 08/22/2022 1025   TRIG 91 08/22/2022 1025   HDL 59 08/22/2022 1025   CHOLHDL 2.5 08/22/2022 1025   CHOLHDL 3.2 02/28/2016 0906   VLDL 19 02/28/2016 0906   LDLCALC 70 08/22/2022 1025   LDLDIRECT 58 11/06/2011 1434    Clinical Atherosclerotic Cardiovascular Disease (ASCVD): Yes  The ASCVD Risk score (Arnett DK, et al., 2019) failed to calculate for the following reasons:   Risk score cannot be calculated because patient has a medical history suggesting prior/existing ASCVD   A/P: Diabetes longstanding 17 years (2008) currently uncontrolled. Patient is able to verbalize appropriate hypoglycemia management plan. Medication adherence appears good. Control is suboptimal due to high variability as seen in report. -Continued basal insulin Tresiba (insulin degludec) 30 units daily in the morning -Increased dose of rapid insulin Novolog (insulin aspart) from 15-18 units to 15-20 units twice daily with meals.   -Patient educated on purpose, proper use, and potential adverse effects.  -Extensively discussed pathophysiology of diabetes, recommended lifestyle interventions, dietary effects on blood sugar control.  -Counseled on  s/sx of and management of hypoglycemia.  -Next A1c anticipated July 2025.   -Sent refills for apixaban (Eliquis) and potassium chloride (Klor-Con) -Gave sample of apixaban (Eliquis) - 3 boxes (42 tablets)  Written patient instructions provided. Patient verbalized understanding of treatment plan.   Total time in face to face counseling 33 minutes.    Follow-up:  Pharmacist PRN - anticipate June 2025 PCP clinic visit in 10/06/23 Patient seen with Teofilo Fellers, PharmD Candidate.

## 2023-09-10 NOTE — Progress Notes (Signed)
 Reviewed and agree with Dr Macky Lower plan.

## 2023-09-11 ENCOUNTER — Other Ambulatory Visit: Payer: Self-pay | Admitting: Family Medicine

## 2023-09-11 DIAGNOSIS — I10 Essential (primary) hypertension: Secondary | ICD-10-CM

## 2023-09-18 ENCOUNTER — Other Ambulatory Visit: Payer: Self-pay | Admitting: Family Medicine

## 2023-09-22 DIAGNOSIS — H401132 Primary open-angle glaucoma, bilateral, moderate stage: Secondary | ICD-10-CM | POA: Diagnosis not present

## 2023-09-22 DIAGNOSIS — H52203 Unspecified astigmatism, bilateral: Secondary | ICD-10-CM | POA: Diagnosis not present

## 2023-09-22 DIAGNOSIS — Z961 Presence of intraocular lens: Secondary | ICD-10-CM | POA: Diagnosis not present

## 2023-09-22 DIAGNOSIS — E113293 Type 2 diabetes mellitus with mild nonproliferative diabetic retinopathy without macular edema, bilateral: Secondary | ICD-10-CM | POA: Diagnosis not present

## 2023-09-22 LAB — HM DIABETES EYE EXAM

## 2023-09-30 ENCOUNTER — Encounter: Payer: Self-pay | Admitting: Family Medicine

## 2023-10-01 ENCOUNTER — Other Ambulatory Visit: Payer: Self-pay

## 2023-10-01 DIAGNOSIS — N184 Chronic kidney disease, stage 4 (severe): Secondary | ICD-10-CM

## 2023-10-01 MED ORDER — TAMSULOSIN HCL 0.4 MG PO CAPS: ORAL_CAPSULE | ORAL | 3 refills | Status: AC

## 2023-10-06 ENCOUNTER — Ambulatory Visit (INDEPENDENT_AMBULATORY_CARE_PROVIDER_SITE_OTHER): Admitting: Family Medicine

## 2023-10-06 ENCOUNTER — Encounter: Payer: Self-pay | Admitting: Family Medicine

## 2023-10-06 VITALS — BP 162/82 | HR 70 | Wt 228.0 lb

## 2023-10-06 DIAGNOSIS — Z794 Long term (current) use of insulin: Secondary | ICD-10-CM | POA: Diagnosis not present

## 2023-10-06 DIAGNOSIS — I10 Essential (primary) hypertension: Secondary | ICD-10-CM | POA: Diagnosis not present

## 2023-10-06 DIAGNOSIS — E119 Type 2 diabetes mellitus without complications: Secondary | ICD-10-CM

## 2023-10-06 NOTE — Progress Notes (Signed)
   SUBJECTIVE:   CHIEF COMPLAINT / HPI:   Diabetes, Type 2 - Last A1c 9.2 08/2023 - Medications: tresiba  30u daily, novolog  15-20u breakfast and lunch. Previously on trulicity  but had trouble affording. Previously on jardiance  but was told to stop due to worsened kidney function. Resistant to retrying GLP1 agonist. - Compliance: 20u novolog  after dinner. Mostly good compliance with tresiba  - Checking BG at home: yes, on CGM. Average CBG 226 last 30 days. 39% in range, 58% above range, 3% below - denies symptoms of hypoglycemia. - Diet: doesn't eat breakfast. First eats around 10am cheerios, bran flakes., sometimes banana. Dinner around 4-5pm, half of sub last night. Post-dinner snack around 9pm peanut butter sandwich or crackers.  - saw pharmacy 4/15, increased novolog  at that time. Recommended June f/u. - Eye exam: UTD - Foot exam: UTD - Microalbumin: due - Statin: yes - PNA vaccine: UTD  OBJECTIVE:   BP (!) 162/82   Pulse 70   Wt 228 lb (103.4 kg)   SpO2 98%   BMI 30.92 kg/m   Gen: well appearing, in NAD Card: Reg rate Lungs: Comfortable WOB on RA Ext: WWP, no edema   ASSESSMENT/PLAN:   HYPERTENSION, BENIGN SYSTEMIC Interestingly elevated today, previously low normal. Did take some allergy medication which may have had decongestant. No changes today, recommend monitor at home, f/u 2 weeks for recheck.   Insulin  dependent type 2 diabetes mellitus (HCC) Slightly improved. No changes today. Could consider increasing tresiba  dose given overall CBGs high and tends to drop significantly after short acting insulin . Has pharmacy f/u next month. F/u with me in July for next A1c.     Kandis Ormond, DO

## 2023-10-06 NOTE — Assessment & Plan Note (Signed)
 Interestingly elevated today, previously low normal. Did take some allergy medication which may have had decongestant. No changes today, recommend monitor at home, f/u 2 weeks for recheck.

## 2023-10-06 NOTE — Addendum Note (Signed)
 Addended by: Lott Rouleau A on: 10/06/2023 12:25 PM   Modules accepted: Orders

## 2023-10-06 NOTE — Progress Notes (Signed)
 Remote ICD transmission.

## 2023-10-06 NOTE — Assessment & Plan Note (Signed)
 Slightly improved. No changes today. Could consider increasing tresiba  dose given overall CBGs high and tends to drop significantly after short acting insulin . Has pharmacy f/u next month. F/u with me in July for next A1c.

## 2023-10-06 NOTE — Patient Instructions (Addendum)
 It was great to see you!  Our plans for today:  - No changes to your medications.  - Come back to see our pharmacist, Dr. Koval, in June - Come back to see me in July. - Call GI to schedule appointment for your colonoscopy: [facility] [address] [place] [phone]  Take care and seek immediate care sooner if you develop any concerns.   Dr. Pedro Whiters

## 2023-10-07 LAB — MICROALBUMIN / CREATININE URINE RATIO
Creatinine, Urine: 47.3 mg/dL
Microalb/Creat Ratio: 624 mg/g{creat} — ABNORMAL HIGH (ref 0–29)
Microalbumin, Urine: 295.3 ug/mL

## 2023-10-13 ENCOUNTER — Telehealth: Payer: Self-pay | Admitting: Pharmacist

## 2023-10-13 NOTE — Telephone Encounter (Signed)
 Patient contacts office to request support with Eliquis  (apixaban ) sample due to cost.   Medication Samples have been provided for the patient.to pick-up later today.  Drug name: Eliquis  (apixaban )       Strength: 5mg         Qty: 70 tablets (5 weeks)  LOT: [id number]  Exp.Date: 06/26/2024  Dosing instructions: 1 tablet BID  The patient has been instructed regarding the correct time, dose, and frequency of taking this medication, including desired effects and most common side effects.   Cristal Don 10/13/2023   Total time with patient call and documentation of interaction: 12 minutes.

## 2023-10-14 NOTE — Telephone Encounter (Signed)
 Reviewed and agree with Dr Macky Lower plan.

## 2023-10-29 ENCOUNTER — Other Ambulatory Visit: Payer: Self-pay | Admitting: Family Medicine

## 2023-10-29 ENCOUNTER — Encounter: Payer: Self-pay | Admitting: Family Medicine

## 2023-10-29 ENCOUNTER — Encounter: Payer: Self-pay | Admitting: Pharmacist

## 2023-10-29 ENCOUNTER — Ambulatory Visit (INDEPENDENT_AMBULATORY_CARE_PROVIDER_SITE_OTHER): Admitting: Pharmacist

## 2023-10-29 ENCOUNTER — Ambulatory Visit (INDEPENDENT_AMBULATORY_CARE_PROVIDER_SITE_OTHER): Admitting: Family Medicine

## 2023-10-29 VITALS — BP 138/72 | HR 76 | Ht 72.0 in | Wt 223.2 lb

## 2023-10-29 VITALS — BP 138/72

## 2023-10-29 DIAGNOSIS — E119 Type 2 diabetes mellitus without complications: Secondary | ICD-10-CM

## 2023-10-29 DIAGNOSIS — I5022 Chronic systolic (congestive) heart failure: Secondary | ICD-10-CM | POA: Diagnosis not present

## 2023-10-29 DIAGNOSIS — I10 Essential (primary) hypertension: Secondary | ICD-10-CM

## 2023-10-29 DIAGNOSIS — I4821 Permanent atrial fibrillation: Secondary | ICD-10-CM

## 2023-10-29 DIAGNOSIS — F32A Depression, unspecified: Secondary | ICD-10-CM

## 2023-10-29 DIAGNOSIS — Z794 Long term (current) use of insulin: Secondary | ICD-10-CM | POA: Diagnosis not present

## 2023-10-29 MED ORDER — TRESIBA FLEXTOUCH 200 UNIT/ML ~~LOC~~ SOPN
32.0000 [IU] | PEN_INJECTOR | Freq: Every day | SUBCUTANEOUS | 1 refills | Status: AC
Start: 2023-10-29 — End: ?

## 2023-10-29 NOTE — Assessment & Plan Note (Signed)
 Atrial fibrillation. Cost of therapy remains challenging for this patient.  Medication Samples have been provided to the patient.

## 2023-10-29 NOTE — Assessment & Plan Note (Signed)
 Has good output with 60mg  BID, continue. Continue to follow with cardiology. Continue with compression.

## 2023-10-29 NOTE — Assessment & Plan Note (Signed)
Doing well on current regimen, no changes made today. 

## 2023-10-29 NOTE — Patient Instructions (Signed)
 It was nice to see you today!  Please continue taking your medications as prescribed.  Continue to use your CGM.   Your goal blood sugar is 80-130 before eating and less than 180 after eating.  Medication Changes:  Continue all other medication the same as discussed with Dr. Rumball.  Keep up the good work with diet and exercise. Aim for a diet full of vegetables, fruit and lean meats (chicken, [place], fish). Try to limit salt intake by eating fresh or frozen vegetables (instead of canned).

## 2023-10-29 NOTE — Patient Instructions (Addendum)
 It was great to see you!  Our plans for today:  - No changes to your blood pressure medicines. - Increase your long acting insulin  (Tresiba ) to 32 units daily.  - Decrease you short acting insulin  (Novolog ) back to 8-10units with lunch and 15u at dinner.  - Come back in 1 month for A1c. - Call GI to schedule appointment for your colonoscopy: [facility] [address] [place] [phone]  Take care and seek immediate care sooner if you develop any concerns.   Dr. Keandria Berrocal

## 2023-10-29 NOTE — Progress Notes (Signed)
    S:     Chief Complaint  Patient presents with   Medication Management    Medication Review   73 y.o. male who presents for diabetes evaluation, education, and management. Patient arrives in good spirits and presents with assistance of a cane.   Patient was also seen by Primary Care Provider, Dr. Alverna John earlier this AM.    PMH is significant for Daibetes and Afib.   Patient reports adherence to taking all medications as prescribed.  Continues to have $$$ issues with his Eliquis  stating that it still is quoted at slightly less than $100.  He was unsure if this we for 30 or 90 days.  Requested sample support.   Do you feel that your medications are working for you? yes  Patient denies hypoglycemic events. LImited activity due to back, hip and leg pains.   O:   Review of Systems  Musculoskeletal:  Positive for back pain and joint pain (knee).  All other systems reviewed and are negative.   Physical Exam Vitals reviewed.  Constitutional:      Appearance: Normal appearance.  Pulmonary:     Effort: Pulmonary effort is normal.  Neurological:     Mental Status: He is alert.  Psychiatric:        Mood and Affect: Mood normal.        Thought Content: Thought content normal.    Libre3 CGM Download today - Reader Time in Goal:  - Time in range 70-180: 16%%  Observed patterns:  Lab Results  Component Value Date   HGBA1C 9.2 (A) 09/01/2023   Vitals:   10/29/23 1050  BP: 138/72    Lipid Panel     Component Value Date/Time   CHOL 146 08/22/2022 1025   TRIG 91 08/22/2022 1025   HDL 59 08/22/2022 1025   CHOLHDL 2.5 08/22/2022 1025   CHOLHDL 3.2 02/28/2016 0906   VLDL 19 02/28/2016 0906   LDLCALC 70 08/22/2022 1025   LDLDIRECT 58 11/06/2011 1434    A/P: Diabetes evaluated and management adjustment by PCP earlier today.  -Patient clearly understands and is comfortable with adjustments made this AM by PCP.   Atrial fibrillation. Cost of therapy remains  challenging for this patient.  Medication Samples have been provided to the patient. Drug name: Eliquis  (apixaban )       Strength: 5mg         Qty: 56 (28 day supply)  LOT: [id number]  Exp.Date: 02/06/2025  Dosing instructions: 1 tablet twice daily.   The patient has been instructed regarding the correct time, dose, and frequency of taking this medication, including desired effects and most common side effects.   Cristal Don 1:46 PM 10/29/2023   Written patient instructions provided. Patient verbalized understanding of treatment plan.  Total time in face to face counseling 16 minutes.    Follow-up:  Pharmacist PRN PCP clinic visit in 1 month.

## 2023-10-29 NOTE — Assessment & Plan Note (Addendum)
 Chronic, uncontrolled. Overall baseline high with steep drops with fast acting insulin , will increase tresiba  to 32u and decrease novolog  back to 8-10u at breakfast/lunch and 15u at dinner. F/u next month for A1c.

## 2023-10-29 NOTE — Assessment & Plan Note (Signed)
 Doing well on current regimen, no changes made today. Mindful of age on paxil.

## 2023-10-29 NOTE — Progress Notes (Signed)
 Reviewed and agree with Dr Macky Lower plan.

## 2023-10-29 NOTE — Progress Notes (Signed)
   SUBJECTIVE:   CHIEF COMPLAINT / HPI:   Hypertension, CHF, Afib: - previously high at last visit, thought 2/2 taking decongestant right before visit. No changes to meds at that time.  - Medications: coreg , lasix , hydralazine , losartan -hydrochlorothiazide, eliquis  - Compliance: good - Checking BP at home: no - some LE edema, taking lasix  60mg  in am and 40mg  in pm, feels this helps keep swelling down.  Diabetes, Type 2 Wearing CGM, glucose 399 currently Avg glucose 290, 65% above goal, no lows in the last 2 weeks.  Taking 28u tresiba . Typically injects novolog  18u at lunch and 20-22u at dinner.  Depression - Mood doing ok on paxil .   HM - has not called for colonoscopy yet  OBJECTIVE:   BP 138/72   Pulse 76   Ht 6' (1.829 m)   Wt 223 lb 3.2 oz (101.2 kg)   SpO2 98%   BMI 30.27 kg/m   Gen: well appearing, in NAD Card: reg rate Lungs: CTAB Ext: WWP, no edema   ASSESSMENT/PLAN:   Congestive heart failure, NYHA class 3, chronic, systolic (HCC) Has good output with 60mg  BID, continue. Continue to follow with cardiology. Continue with compression.   HYPERTENSION, BENIGN SYSTEMIC Doing well on current regimen, no changes made today.  Insulin  dependent type 2 diabetes mellitus (HCC) Chronic, uncontrolled. Overall baseline high with steep drops with fast acting insulin , will increase tresiba  to 32u and decrease novolog  back to 8-10u at breakfast/lunch and 15u at dinner. F/u next month for A1c.   Depression Doing well on current regimen, no changes made today. Mindful of age on paxil .   HM - gave number to call for colonoscopy.  Kandis Ormond, DO

## 2023-10-31 DIAGNOSIS — H401132 Primary open-angle glaucoma, bilateral, moderate stage: Secondary | ICD-10-CM | POA: Diagnosis not present

## 2023-11-03 ENCOUNTER — Other Ambulatory Visit: Payer: Self-pay | Admitting: Family Medicine

## 2023-11-03 DIAGNOSIS — E119 Type 2 diabetes mellitus without complications: Secondary | ICD-10-CM

## 2023-11-03 NOTE — Telephone Encounter (Signed)
 Sent new Rx few days ago

## 2023-11-06 ENCOUNTER — Other Ambulatory Visit: Payer: Self-pay

## 2023-11-06 MED ORDER — FUROSEMIDE 20 MG PO TABS: 60.0000 mg | ORAL_TABLET | Freq: Every day | ORAL | 0 refills | Status: DC

## 2023-11-10 ENCOUNTER — Other Ambulatory Visit: Payer: Self-pay | Admitting: Family Medicine

## 2023-11-10 DIAGNOSIS — E119 Type 2 diabetes mellitus without complications: Secondary | ICD-10-CM

## 2023-11-10 DIAGNOSIS — G8929 Other chronic pain: Secondary | ICD-10-CM

## 2023-11-11 ENCOUNTER — Telehealth: Payer: Self-pay

## 2023-11-11 NOTE — Telephone Encounter (Signed)
 Alert remote transmission: Cardiac Resynchronization Therapy pacing of < 85%. Pacing was 72% between Nov 10, 2023 03:10 and Nov 11, 2023 02:58. Presenting rhythm BiV pace with bigeminal PVC's    LM for patient to call back. He is past due for follow up with provider - needs appt and need to assess sx's, health and med usage.  LV threshold is programmed at 4V with history of high output.

## 2023-11-12 ENCOUNTER — Encounter: Payer: Self-pay | Admitting: Gastroenterology

## 2023-11-13 ENCOUNTER — Other Ambulatory Visit: Payer: Self-pay | Admitting: Family Medicine

## 2023-11-13 DIAGNOSIS — Z794 Long term (current) use of insulin: Secondary | ICD-10-CM

## 2023-11-13 DIAGNOSIS — E119 Type 2 diabetes mellitus without complications: Secondary | ICD-10-CM

## 2023-11-13 NOTE — Telephone Encounter (Signed)
Attempted to contact patient. No answer, left message to call back

## 2023-11-14 NOTE — Telephone Encounter (Signed)
 Pt is overdue for follow up and will require in person device testing.  Message sent to scheduler to schedule follow up for evaluation.

## 2023-11-14 NOTE — Telephone Encounter (Signed)
 Recently sent to pharmacy 3 days ago with dose change.

## 2023-11-17 NOTE — Progress Notes (Deleted)
 Electrophysiology Office Note:   Date:  11/17/2023  ID:  Anthony [name], Anthony [name] 21-Jan-1951, MRN [id number]  Primary Cardiologist: Vina Gull, MD Electrophysiologist: Elspeth Sage, MD  {Click to update primary MD,subspecialty MD or APP then REFRESH:1}    History of Present Illness:   Anthony [name] is a 73 y.o. male with h/o chronic systolic heart failure and permanent atrial fibrillation s/p CRT-D and AVN ablation who is being seen today to establish care of his ICD.   Discussed the use of AI scribe software for clinical note transcription with the patient, who gave verbal consent to proceed.  History of Present Illness     Review of systems complete and found to be negative unless listed in HPI.   EP Information / Studies Reviewed:    {EKGtoday:28818}      Echo 02/17/21: 1. Left ventricular ejection fraction, by estimation, is 60 to 65%. The  left ventricle has normal function. The left ventricle has no regional  wall motion abnormalities. There is mild left ventricular hypertrophy.  Left ventricular diastolic parameters  are indeterminate.   2. Pacing wires in RA/RV. Right ventricular systolic function is normal.  The right ventricular size is normal.   3. Left atrial size was moderately dilated.   4. The mitral valve is abnormal. Trivial mitral valve regurgitation. No  evidence of mitral stenosis.   5. The aortic valve was not well visualized. There is mild calcification  of the aortic valve. Aortic valve regurgitation is not visualized. Mild  aortic valve sclerosis is present, with no evidence of aortic valve  stenosis.   6. The inferior vena cava is normal in size with greater than 50%  respiratory variability, suggesting right atrial pressure of 3 mmHg.    Risk Assessment/Calculations:    CHA2DS2-VASc Score = 7  {Confirm score is correct.  If not, click here to update score.  REFRESH note.  :1} This indicates a 11.2% annual risk of stroke. The patient's score is  based upon: CHF History: 1 HTN History: 1 Diabetes History: 1 Stroke History: 2 Vascular Disease History: 1 Age Score: 1 Gender Score: 0   {This patient has a significant risk of stroke if diagnosed with atrial fibrillation.  Please consider VKA or DOAC agent for anticoagulation if the bleeding risk is acceptable.   You can also use the SmartPhrase .[email or web address] for documentation.   :[id number]} No BP recorded.  {Refresh Note OR Click here to enter BP  :1}***        Physical Exam:   VS:  There were no vitals taken for this visit.   Wt Readings from Last 3 Encounters:  10/29/23 223 lb 3.2 oz (101.2 kg)  10/06/23 228 lb (103.4 kg)  09/09/23 214 lb 9.6 oz (97.3 kg)     GEN: Well nourished, well developed in no acute distress NECK: No JVD CARDIAC: {EPRHYTHM:28826}, no murmurs, rubs, gallops RESPIRATORY:  Clear to auscultation without rales, wheezing or rhonchi  ABDOMEN: Soft, non-distended EXTREMITIES:  No edema; No deformity   ASSESSMENT AND PLAN:   Atrial fibrillation-permanent rapid ventricular response   AV Node ablation    CRT-D-[employer]   Low-voltage limb leads and severe concentric LVH concerning for amyloid.  Negative PYP scan   Congestive heart failure-chronic-diastolic class IIa     Hypertension   Renal insufficiency grade 4   Presyncope   HFrecEF   Coronary artery disease with circumflex stenting remote     Volume overloaded.  No symptoms to suggest worsening ischemia.  Diet is replete with salt.  Have encouraged decrease sodium will increase his furosemide  from 40--80 for 5 days and then back to 40.   Blood pressure is well-controlled.  Continue him on Apresoline  and losartan .   No bleeding.  Continue on the Eliquis  5 twice daily.    Follow up with {EPMDS:28135::EP Team} {EPFOLLOW LE:71826}  Signed, Fonda Kitty, MD

## 2023-11-18 ENCOUNTER — Ambulatory Visit: Admitting: Cardiology

## 2023-11-20 ENCOUNTER — Encounter: Payer: Self-pay | Admitting: Cardiology

## 2023-11-20 ENCOUNTER — Ambulatory Visit: Attending: Cardiology | Admitting: Cardiology

## 2023-11-20 VITALS — BP 134/75 | HR 70 | Ht 72.0 in | Wt 214.0 lb

## 2023-11-20 DIAGNOSIS — D6869 Other thrombophilia: Secondary | ICD-10-CM

## 2023-11-20 DIAGNOSIS — Z95810 Presence of automatic (implantable) cardiac defibrillator: Secondary | ICD-10-CM | POA: Diagnosis not present

## 2023-11-20 DIAGNOSIS — I4821 Permanent atrial fibrillation: Secondary | ICD-10-CM

## 2023-11-20 DIAGNOSIS — I5032 Chronic diastolic (congestive) heart failure: Secondary | ICD-10-CM | POA: Diagnosis not present

## 2023-11-20 DIAGNOSIS — I428 Other cardiomyopathies: Secondary | ICD-10-CM

## 2023-11-20 NOTE — Progress Notes (Signed)
 Electrophysiology Office Note:   Date:  11/21/2023  ID:  Anthony, [name] September 22, 1950, MRN [id number]  Primary Cardiologist: Vina Gull, MD Electrophysiologist: Fonda Kitty, MD      History of Present Illness:   Anthony [name] is a 73 y.o. male with h/o chronic systolic heart failure and permanent atrial fibrillation s/p CRT-D and AVN ablation who is being seen today to establish care of his ICD.   Discussed the use of AI scribe software for clinical note transcription with the patient, who gave verbal consent to proceed.  History of Present Illness He has a CRT-D.The device has not delivered any shocks. Following an AV node ablation, he is pacemaker-dependent. He experiences some swelling in his legs, which is well managed with a diuretic that he finds effective. Occasionally, he experiences shortness of breath, particularly with increased activity, which limits his physical exertion. He moderates his activity to avoid discomfort from an increased heart rate. He sleeps comfortably in bed with a single pillow and does not experience nocturnal dyspnea or wake up gasping for breath. He describes his sleep as comfortable, stating, 'I lay down with my eyes closed. And there it is.' Otherwise doing well with no new or acute complaints today.  Review of systems complete and found to be negative unless listed in HPI.   EP Information / Studies Reviewed:    EKG is ordered today. Personal review as below.  EKG Interpretation Date/Time:  Thursday November 20 2023 15:13:56 EDT Ventricular Rate:  70 PR Interval:    QRS Duration:  152 QT Interval:  476 QTC Calculation: 514 R Axis:   127  Text Interpretation: Ventricular-paced rhythm Biventricular pacemaker detected When compared with ECG of 16-Feb-2021 18:03, Premature ventricular complexes are no longer Present Vent. rate has decreased BY   3 BPM Confirmed by Kitty Fonda [phone]) on 11/21/2023 5:12:37 PM   Echo 02/17/21: 1. Left ventricular  ejection fraction, by estimation, is 60 to 65%. The  left ventricle has normal function. The left ventricle has no regional  wall motion abnormalities. There is mild left ventricular hypertrophy.  Left ventricular diastolic parameters  are indeterminate.   2. Pacing wires in RA/RV. Right ventricular systolic function is normal.  The right ventricular size is normal.   3. Left atrial size was moderately dilated.   4. The mitral valve is abnormal. Trivial mitral valve regurgitation. No  evidence of mitral stenosis.   5. The aortic valve was not well visualized. There is mild calcification  of the aortic valve. Aortic valve regurgitation is not visualized. Mild  aortic valve sclerosis is present, with no evidence of aortic valve  stenosis.   6. The inferior vena cava is normal in size with greater than 50%  respiratory variability, suggesting right atrial pressure of 3 mmHg.    Risk Assessment/Calculations:    CHA2DS2-VASc Score = 7   This indicates a 11.2% annual risk of stroke. The patient's score is based upon: CHF History: 1 HTN History: 1 Diabetes History: 1 Stroke History: 2 Vascular Disease History: 1 Age Score: 1 Gender Score: 0             Physical Exam:   VS:  BP 134/75   Pulse 70   Ht 6' (1.829 m)   Wt 214 lb (97.1 kg)   SpO2 96%   BMI 29.02 kg/m    Wt Readings from Last 3 Encounters:  11/20/23 214 lb (97.1 kg)  10/29/23 223 lb 3.2 oz (101.2 kg)  10/06/23  228 lb (103.4 kg)     GEN: Well nourished, well developed in no acute distress NECK: No JVD CARDIAC: Normal rate, regular RESPIRATORY:  Clear to auscultation without rales, wheezing or rhonchi  ABDOMEN: Soft, non-distended EXTREMITIES:  Trace edema; No deformity   ASSESSMENT AND PLAN:     #. HFimpEF s/p CRT-D: Reasonably well compensated today.  - In-clinic device interrogation was performed.  Appropriate device function and stable lead parameters.  Presenting rhythm is BiV paced.  BiV pacing  percentage 98%.  No arrhythmias or device therapies.  No programming changes made.  Estimated longevity 6 years. - Continue remote monitoring. -Continue GDMT regimen of carvedilol  25 mg twice daily, hydralazine  50 mg 3 times daily, losartan .  No spironolactone due to advanced CKD.  He has furosemide  60 mg once daily. - Refer back to general cardiology to reestablish care for his heart failure, CAD and hypertension management.  He is a former patient of Dr. Hobart.  #. Permanent AF s/p AVJ ablatio and CRT:  #. Secondary hypercoagulable state due to AF:  - Device interrogation as above. -Continue Eliquis  5 mg twice daily.   #. Hypertension At goal today.  Recommend checking blood pressures 1-2 times per week at home and recording the values.  Recommend bringing these recordings to the primary care physician.  Follow up with Dr. Kennyth in 6 months  Signed, Fonda Kennyth, MD

## 2023-11-20 NOTE — Patient Instructions (Signed)
 Medication Instructions:  Your physician recommends that you continue on your current medications as directed. Please refer to the Current Medication list given to you today.  *If you need a refill on your cardiac medications before your next appointment, please call your pharmacy*  Follow-Up: At [facility], you and your health needs are our priority.  As part of our continuing mission to provide you with exceptional heart care, our providers are all part of one team.  This team includes your primary Cardiologist (physician) and Advanced Practice Providers or APPs (Physician Assistants and Nurse Practitioners) who all work together to provide you with the care you need, when you need it.  Your next appointment:   1 year  Provider:   You may see Fonda Kitty, MD or one of the following Advanced Practice Providers on your designated Care Team:   Charlies Arthur, PA-C Michael Andy Tillery, PA-C Suzann Riddle, NP Daphne Barrack, NP    You have been referred back to see General Cardiology

## 2023-12-01 ENCOUNTER — Ambulatory Visit: Payer: PPO

## 2023-12-01 DIAGNOSIS — I428 Other cardiomyopathies: Secondary | ICD-10-CM | POA: Diagnosis not present

## 2023-12-02 LAB — CUP PACEART REMOTE DEVICE CHECK
Battery Remaining Longevity: 66 mo
Battery Remaining Percentage: 76 %
Brady Statistic RA Percent Paced: 0 %
Brady Statistic RV Percent Paced: 99 %
Date Time Interrogation Session: 20250707030200
HighPow Impedance: 73 Ohm
Implantable Lead Connection Status: 753985
Implantable Lead Connection Status: 753985
Implantable Lead Implant Date: 20190301
Implantable Lead Implant Date: 20190301
Implantable Lead Location: 753858
Implantable Lead Location: 753860
Implantable Pulse Generator Implant Date: 20231222
Lead Channel Impedance Value: 641 Ohm
Lead Channel Impedance Value: 834 Ohm
Lead Channel Pacing Threshold Amplitude: 0.5 V
Lead Channel Pacing Threshold Pulse Width: 0.4 ms
Lead Channel Setting Pacing Amplitude: 2 V
Lead Channel Setting Pacing Amplitude: 4 V
Lead Channel Setting Pacing Pulse Width: 0.4 ms
Lead Channel Setting Pacing Pulse Width: 1.5 ms
Lead Channel Setting Sensing Sensitivity: 0.6 mV
Lead Channel Setting Sensing Sensitivity: 1 mV
Pulse Gen Serial Number: 285896

## 2023-12-05 ENCOUNTER — Ambulatory Visit: Admitting: Family Medicine

## 2023-12-05 DIAGNOSIS — Z961 Presence of intraocular lens: Secondary | ICD-10-CM | POA: Diagnosis not present

## 2023-12-05 DIAGNOSIS — H401133 Primary open-angle glaucoma, bilateral, severe stage: Secondary | ICD-10-CM | POA: Diagnosis not present

## 2023-12-09 ENCOUNTER — Ambulatory Visit: Admitting: Podiatry

## 2023-12-11 ENCOUNTER — Ambulatory Visit: Payer: Self-pay | Admitting: Cardiology

## 2023-12-22 ENCOUNTER — Other Ambulatory Visit: Payer: Self-pay | Admitting: Family Medicine

## 2023-12-23 ENCOUNTER — Other Ambulatory Visit: Payer: Self-pay | Admitting: Family Medicine

## 2023-12-29 ENCOUNTER — Ambulatory Visit: Admitting: Family Medicine

## 2024-01-05 NOTE — Progress Notes (Deleted)
 Chief Complaint:Screening colonoscopy. H/o multiple polyps  Primary GI Doctor: (Dr. Aneita)  HPI:  Patient is a  73  year old male patient with past medical history of chronic systolic heart failure and permanent atrial fibrillation s/p CRT-D and AVN ablation *****who was referred to me by Madelon Donald HERO, DO on 06/30/23 for a evaluation of Screening colonoscopy. H/o multiple polyps   11/20/23 last cardiology appointment  .    Interval History  Patient admits/denies GERD Patient admits/denies dysphagia Patient admits/denies nausea, vomiting, or weight loss  Patient admits/denies altered bowel habits Patient admits/denies abdominal pain Patient admits/denies rectal bleeding   Denies/Admits alcohol Denies/Admits smoking Denies/Admits NSAID use. Denies/Admits they are on blood thinners. Patient on Eliquis  5mg  twice daily  Patients last colonoscopy Patients last EGD  Surgical history:  Patient's family history includes  Wt Readings from Last 3 Encounters:  11/20/23 214 lb (97.1 kg)  10/29/23 223 lb 3.2 oz (101.2 kg)  10/06/23 228 lb (103.4 kg)      Past Medical History:  Diagnosis Date   Arthritis    Benign neoplasm of descending colon    Benign neoplasm of sigmoid colon    Benign neoplasm of transverse colon    CAD in native artery    a. reported MI 2000, 2001, s/p stenting of the circumflex lesion extending into the second obtuse marginal branch with a drug-eluting stent in 2003. b. low risk nuc 2015.   Chronic atrial fibrillation (HCC)    Chronic combined systolic and diastolic CHF (congestive heart failure) (HCC)    a. Previously diastolic, then EF 25-30% in 10/2016.   CKD (chronic kidney disease), stage III (HCC)    Depression    Diabetes mellitus    Edema of extremities    Erectile dysfunction    GERD (gastroesophageal reflux disease)    Hyperlipidemia    Hypertension    Myocardial infarction (HCC) 2000&2001   Obesity    Onychomycosis    OSA  (obstructive sleep apnea)    S/P ICD (internal cardiac defibrillator) procedure and BiV device,  07/25/17 [employer]  07/26/2017   Seizures (HCC)    Sleep apnea    Stroke (HCC)    Tubular adenoma of colon 10/2002    Past Surgical History:  Procedure Laterality Date   ANGIOPLASTY  2001   stent x 1   AV NODE ABLATION N/A 08/20/2017   Procedure: AV NODE ABLATION;  Surgeon: Fernande Elspeth BROCKS, MD;  Location[facility] INVASIVE CV LAB;  Service: Cardiovascular;  Laterality: N/A;   BIV ICD GENERATOR CHANGEOUT N/A 05/17/2022   Procedure: BIV ICD GENERATOR CHANGEOUT;  Surgeon: Fernande Elspeth BROCKS, MD;  Location: [facility] INVASIVE CV LAB;  Service: Cardiovascular;  Laterality: N/A;   BIV ICD INSERTION CRT-D N/A 07/25/2017   Procedure: BIV ICD INSERTION CRT-D;  Surgeon: Fernande Elspeth BROCKS, MD;  Location: [facility] INVASIVE CV LAB;  Service: Cardiovascular;  Laterality: N/A;   COLONOSCOPY  2000?   negative   COLONOSCOPY WITH PROPOFOL  N/A 02/08/2019   Procedure: COLONOSCOPY WITH PROPOFOL ;  Surgeon: Aneita Gwendlyn DASEN, MD;  Location: [facility] ENDOSCOPY;  Service: Endoscopy;  Laterality: N/A;   FOOT ARTHROTOMY Right    POLYPECTOMY  02/08/2019   Procedure: POLYPECTOMY;  Surgeon: Aneita Gwendlyn DASEN, MD;  Location: [facility] ENDOSCOPY;  Service: Endoscopy;;   TOE AMPUTATION Left 2012    Current Outpatient Medications  Medication Sig Dispense Refill   acetaminophen  (TYLENOL ) 650 MG CR tablet Take 1,300 mg by mouth 2 (two) times daily as needed for  pain.     apixaban  (ELIQUIS ) 5 MG TABS tablet Take 1 tablet (5 mg total) by mouth 2 (two) times daily. 60 tablet 5   apixaban  (ELIQUIS ) 5 MG TABS tablet Take 1 tablet (5 mg total) by mouth 2 (two) times daily. 42 tablet 0   calcitRIOL  (ROCALTROL ) 0.5 MCG capsule Take 0.5 mcg by mouth daily.     carvedilol  (COREG ) 25 MG tablet TAKE 1 TABLET 2 TIMES DAILY. 180 tablet 3   Continuous Glucose Sensor (FREESTYLE LIBRE 3 PLUS SENSOR) MISC USE 1 SENSOR EVERY 14 DAYS 2 each 6   furosemide  (LASIX ) 20 MG tablet Take 3  tablets (60 mg total) by mouth daily. 270 tablet 0   gabapentin  (NEURONTIN ) 300 MG capsule TAKE 1 CAPSULE BY MOUTH 4 TIMES DAILY. 360 capsule 3   hydrALAZINE  (APRESOLINE ) 50 MG tablet Take 1 tablet (50 mg total) by mouth 3 (three) times daily. 270 tablet 2   insulin  aspart (NOVOLOG  FLEXPEN) 100 UNIT/ML FlexPen INJECT INTO THE SKIN THREE TIMES DAILY BEFORE MEALS. INJECT 8-10 UNITS BEFORE BREAKFAST AND LUNCH AND INJECT 15-20 UNITS BEFORE DINNER. 15 mL 3   insulin  degludec (TRESIBA  FLEXTOUCH) 200 UNIT/ML FlexTouch Pen Inject 32 Units into the skin daily. 9 mL 2   Insulin  Pen Needle (PEN NEEDLES) 32G X 4 MM MISC 1 Device by Does not apply route as directed. 200 each 11   Lancets (ONETOUCH ULTRASOFT) lancets Use as instructed (Patient not taking: Reported on 11/20/2023) 100 each 12   latanoprost (XALATAN) 0.005 % ophthalmic solution Place 1 drop into both eyes at bedtime.     loratadine (CLARITIN) 10 MG tablet Take 10 mg by mouth daily as needed for allergies.     losartan -hydrochlorothiazide (HYZAAR) 50-12.5 MG tablet TAKE 1 TABLET BY MOUTH EVERY DAY 90 tablet 3   Multiple Vitamin (MULTIVITAMIN) tablet Take 1 tablet by mouth daily.       nitroGLYCERIN  (NITROSTAT ) 0.4 MG SL tablet Place 1 tablet (0.4 mg total) under the tongue every 5 (five) minutes as needed for chest pain. If no relief by 3rd tab, call 911 25 tablet 3   omeprazole  (PRILOSEC) 40 MG capsule TAKE 1 CAPSULE BY MOUTH DAILY BEFORE SUPPER 90 capsule 3   Oxycodone HCl 10 MG TABS Take 10 mg by mouth 3 (three) times daily.     PARoxetine  (PAXIL ) 30 MG tablet TAKE 1 TABLET BY MOUTH EVERY DAY 90 tablet 3   potassium chloride  SA (KLOR-CON  M20) 20 MEQ tablet Take 1 tablet (20 mEq total) by mouth daily. 90 tablet 3   rosuvastatin  (CRESTOR ) 20 MG tablet Take 1 tablet (20 mg total) by mouth daily. 30 tablet 0   tamsulosin  (FLOMAX ) 0.4 MG CAPS capsule TAKE 1 CAPSULE BY MOUTH EVERYDAY AT BEDTIME 90 capsule 3   No current facility-administered  medications for this visit.    Allergies as of 01/06/2024 - Review Complete 11/20/2023  Allergen Reaction Noted   Enalapril maleate Cough 08/19/2005    Family History  Problem Relation Age of Onset   Heart attack Mother    CVA Mother    Diabetes Mother    Heart attack Father    Colon cancer Neg Hx    Stomach cancer Neg Hx     Review of Systems:    Constitutional: No weight loss, fever, chills, weakness or fatigue HEENT: Eyes: No change in vision               Ears, Nose, Throat:  No change in hearing  or congestion Skin: No rash or itching Cardiovascular: No chest pain, chest pressure or palpitations   Respiratory: No SOB or cough Gastrointestinal: See HPI and otherwise negative Genitourinary: No dysuria or change in urinary frequency Neurological: No headache, dizziness or syncope Musculoskeletal: No new muscle or joint pain Hematologic: No bleeding or bruising Psychiatric: No history of depression or anxiety    Physical Exam:  Vital signs: There were no vitals taken for this visit.  Constitutional:   Pleasant *** male/male appears to be in NAD, Well developed, Well nourished, alert and cooperative Eyes:   PEERL, EOMI. No icterus. Conjunctiva pink. Neck:  Supple Throat: Oral cavity and pharynx without inflammation, swelling or lesion.  Respiratory: Respirations even and unlabored. Lungs clear to auscultation bilaterally.   No wheezes, crackles, or rhonchi.  Cardiovascular: Normal S1, S2. Regular rate and rhythm. No peripheral edema, cyanosis or pallor.  Gastrointestinal:  Soft, nondistended, nontender. No rebound or guarding. Normal bowel sounds. No appreciable masses or hepatomegaly. Rectal:  Not performed.  Anoscopy: Msk:  Symmetrical without gross deformities. Without edema, no deformity or joint abnormality.  Neurologic:  Alert and  oriented x4;  grossly normal neurologically.  Skin:   Dry and intact without significant lesions or rashes.  RELEVANT LABS AND  IMAGING: CBC    Latest Ref Rng & Units 02/05/2023    3:52 PM 05/10/2022    2:42 PM 10/03/2021    3:04 PM  CBC  WBC 3.4 - 10.8 x10E3/uL 7.4  7.3  6.5   Hemoglobin 13.0 - 17.7 g/dL 86.7  85.1  87.3   Hematocrit 37.5 - 51.0 % 38.4  44.0  37.5   Platelets 150 - 450 x10E3/uL 150  136  126      CMP     Latest Ref Rng & Units 09/01/2023   11:02 AM 06/16/2023   11:50 AM 02/05/2023    3:52 PM  CMP  Glucose 70 - 99 mg/dL 832  612  75   BUN 8 - 27 mg/dL 38  41  31   Creatinine 0.76 - 1.27 mg/dL 7.28  7.06  7.07   Sodium 134 - 144 mmol/L 141  138  140   Potassium 3.5 - 5.2 mmol/L 4.7  4.0  3.4   Chloride 96 - 106 mmol/L 99  98  100   CO2 20 - 29 mmol/L 28  27  25    Calcium  8.6 - 10.2 mg/dL 9.4  9.3  89.8      Lab Results  Component Value Date   TSH 0.935 02/17/2021  9/22 echo- Left ventricular ejection fraction, by estimation, is 60 to 65%.   GI procedures: 02/08/2019 colonoscopy with Dr. Aneita, recall 3 years - Six 6 to 8 mm polyps in the transverse colon, removed with a cold snare. Resected and retrieved. - Two 7 to 8 mm polyps in the descending colon, removed with a cold snare. Resected and retrieved. - Ten 6 to 8 mm polyps in the sigmoid colon, removed with a cold snare. Resected and retrieved. - Mild diverticulosis in the left colon. - Internal hemorrhoids. - The examination was otherwise normal on direct and retroflexion views. Path: 1. Colon, polyp(s), transverse - MULTIPLE FRAGMENTS OF TUBULAR ADENOMA(S) - NO HIGH GRADE DYSPLASIA OR MALIGNANCY IDENTIFIED 2. Colon, polyp(s), descending - MULTIPLE FRAGMENTS OF HYPERPLASTIC POLYP(S) - NO HIGH GRADE DYSPLASIA OR MALIGNANCY IDENTIFIED  10/2002 colonoscopy with Dr. Aneita***    Assessment:  Afib -on Eliquis  Diabetes, Type 2  History of colonic poylps  Plan: Schedule for a colonoscopy. The risks and benefits of colonoscopy with possible polypectomy / biopsies were discussed and the patient agrees to proceed.  -cardiac  clearance     Thank you for the courtesy of this consult. Please call me with any questions or concerns.   Dorene Bruni, FNP-C [facility] 01/05/2024, 9:54 PM  Cc: Rumball, Alison M, DO

## 2024-01-06 ENCOUNTER — Ambulatory Visit: Admitting: Gastroenterology

## 2024-01-19 ENCOUNTER — Ambulatory Visit: Admitting: Family Medicine

## 2024-01-26 ENCOUNTER — Encounter (HOSPITAL_COMMUNITY): Payer: Self-pay

## 2024-01-26 ENCOUNTER — Emergency Department (HOSPITAL_COMMUNITY)

## 2024-01-26 ENCOUNTER — Inpatient Hospital Stay (HOSPITAL_COMMUNITY)
Admission: EM | Admit: 2024-01-26 | Discharge: 2024-01-31 | DRG: 638 | Disposition: A | Discharge status: Skilled Nursing Facility | Attending: Family Medicine | Admitting: Family Medicine

## 2024-01-26 ENCOUNTER — Inpatient Hospital Stay (HOSPITAL_COMMUNITY)

## 2024-01-26 ENCOUNTER — Other Ambulatory Visit: Payer: Self-pay

## 2024-01-26 DIAGNOSIS — E1100 Type 2 diabetes mellitus with hyperosmolarity without nonketotic hyperglycemic-hyperosmolar coma (NKHHC): Secondary | ICD-10-CM | POA: Diagnosis not present

## 2024-01-26 DIAGNOSIS — G8929 Other chronic pain: Secondary | ICD-10-CM | POA: Diagnosis present

## 2024-01-26 DIAGNOSIS — H401133 Primary open-angle glaucoma, bilateral, severe stage: Secondary | ICD-10-CM | POA: Diagnosis not present

## 2024-01-26 DIAGNOSIS — I130 Hypertensive heart and chronic kidney disease with heart failure and stage 1 through stage 4 chronic kidney disease, or unspecified chronic kidney disease: Secondary | ICD-10-CM | POA: Diagnosis present

## 2024-01-26 DIAGNOSIS — Z7901 Long term (current) use of anticoagulants: Secondary | ICD-10-CM

## 2024-01-26 DIAGNOSIS — Z95810 Presence of automatic (implantable) cardiac defibrillator: Secondary | ICD-10-CM | POA: Diagnosis not present

## 2024-01-26 DIAGNOSIS — E876 Hypokalemia: Secondary | ICD-10-CM | POA: Diagnosis not present

## 2024-01-26 DIAGNOSIS — Z794 Long term (current) use of insulin: Secondary | ICD-10-CM

## 2024-01-26 DIAGNOSIS — Z8673 Personal history of transient ischemic attack (TIA), and cerebral infarction without residual deficits: Secondary | ICD-10-CM

## 2024-01-26 DIAGNOSIS — R079 Chest pain, unspecified: Secondary | ICD-10-CM | POA: Diagnosis not present

## 2024-01-26 DIAGNOSIS — E113293 Type 2 diabetes mellitus with mild nonproliferative diabetic retinopathy without macular edema, bilateral: Secondary | ICD-10-CM | POA: Diagnosis not present

## 2024-01-26 DIAGNOSIS — E860 Dehydration: Secondary | ICD-10-CM | POA: Diagnosis present

## 2024-01-26 DIAGNOSIS — I700 Atherosclerosis of aorta: Secondary | ICD-10-CM | POA: Diagnosis not present

## 2024-01-26 DIAGNOSIS — M48061 Spinal stenosis, lumbar region without neurogenic claudication: Secondary | ICD-10-CM | POA: Diagnosis not present

## 2024-01-26 DIAGNOSIS — I5022 Chronic systolic (congestive) heart failure: Secondary | ICD-10-CM | POA: Diagnosis present

## 2024-01-26 DIAGNOSIS — E1142 Type 2 diabetes mellitus with diabetic polyneuropathy: Secondary | ICD-10-CM | POA: Diagnosis not present

## 2024-01-26 DIAGNOSIS — G4733 Obstructive sleep apnea (adult) (pediatric): Secondary | ICD-10-CM | POA: Diagnosis not present

## 2024-01-26 DIAGNOSIS — Z888 Allergy status to other drugs, medicaments and biological substances status: Secondary | ICD-10-CM

## 2024-01-26 DIAGNOSIS — G4089 Other seizures: Secondary | ICD-10-CM | POA: Diagnosis not present

## 2024-01-26 DIAGNOSIS — Z79891 Long term (current) use of opiate analgesic: Secondary | ICD-10-CM | POA: Diagnosis not present

## 2024-01-26 DIAGNOSIS — Z79899 Other long term (current) drug therapy: Secondary | ICD-10-CM

## 2024-01-26 DIAGNOSIS — Z87891 Personal history of nicotine dependence: Secondary | ICD-10-CM

## 2024-01-26 DIAGNOSIS — R531 Weakness: Secondary | ICD-10-CM | POA: Diagnosis not present

## 2024-01-26 DIAGNOSIS — I252 Old myocardial infarction: Secondary | ICD-10-CM

## 2024-01-26 DIAGNOSIS — M6281 Muscle weakness (generalized): Secondary | ICD-10-CM | POA: Diagnosis not present

## 2024-01-26 DIAGNOSIS — D631 Anemia in chronic kidney disease: Secondary | ICD-10-CM | POA: Diagnosis not present

## 2024-01-26 DIAGNOSIS — E1122 Type 2 diabetes mellitus with diabetic chronic kidney disease: Secondary | ICD-10-CM | POA: Diagnosis not present

## 2024-01-26 DIAGNOSIS — K219 Gastro-esophageal reflux disease without esophagitis: Secondary | ICD-10-CM | POA: Diagnosis not present

## 2024-01-26 DIAGNOSIS — E162 Hypoglycemia, unspecified: Secondary | ICD-10-CM | POA: Diagnosis not present

## 2024-01-26 DIAGNOSIS — R339 Retention of urine, unspecified: Secondary | ICD-10-CM | POA: Diagnosis not present

## 2024-01-26 DIAGNOSIS — R2689 Other abnormalities of gait and mobility: Secondary | ICD-10-CM | POA: Diagnosis not present

## 2024-01-26 DIAGNOSIS — I2510 Atherosclerotic heart disease of native coronary artery without angina pectoris: Secondary | ICD-10-CM | POA: Diagnosis not present

## 2024-01-26 DIAGNOSIS — N184 Chronic kidney disease, stage 4 (severe): Secondary | ICD-10-CM | POA: Diagnosis not present

## 2024-01-26 DIAGNOSIS — M4316 Spondylolisthesis, lumbar region: Secondary | ICD-10-CM | POA: Diagnosis present

## 2024-01-26 DIAGNOSIS — I4821 Permanent atrial fibrillation: Secondary | ICD-10-CM | POA: Diagnosis present

## 2024-01-26 DIAGNOSIS — I639 Cerebral infarction, unspecified: Secondary | ICD-10-CM | POA: Diagnosis not present

## 2024-01-26 DIAGNOSIS — R29818 Other symptoms and signs involving the nervous system: Secondary | ICD-10-CM | POA: Diagnosis not present

## 2024-01-26 DIAGNOSIS — I959 Hypotension, unspecified: Secondary | ICD-10-CM | POA: Diagnosis present

## 2024-01-26 DIAGNOSIS — M5441 Lumbago with sciatica, right side: Secondary | ICD-10-CM | POA: Diagnosis not present

## 2024-01-26 DIAGNOSIS — N179 Acute kidney failure, unspecified: Secondary | ICD-10-CM | POA: Diagnosis present

## 2024-01-26 DIAGNOSIS — E119 Type 2 diabetes mellitus without complications: Secondary | ICD-10-CM | POA: Diagnosis not present

## 2024-01-26 DIAGNOSIS — M5440 Lumbago with sciatica, unspecified side: Secondary | ICD-10-CM | POA: Diagnosis not present

## 2024-01-26 DIAGNOSIS — R4182 Altered mental status, unspecified: Secondary | ICD-10-CM | POA: Insufficient documentation

## 2024-01-26 DIAGNOSIS — F339 Major depressive disorder, recurrent, unspecified: Secondary | ICD-10-CM | POA: Diagnosis not present

## 2024-01-26 DIAGNOSIS — N400 Enlarged prostate without lower urinary tract symptoms: Secondary | ICD-10-CM | POA: Diagnosis not present

## 2024-01-26 DIAGNOSIS — M4726 Other spondylosis with radiculopathy, lumbar region: Secondary | ICD-10-CM | POA: Diagnosis not present

## 2024-01-26 DIAGNOSIS — I517 Cardiomegaly: Secondary | ICD-10-CM | POA: Diagnosis not present

## 2024-01-26 DIAGNOSIS — E8809 Other disorders of plasma-protein metabolism, not elsewhere classified: Secondary | ICD-10-CM | POA: Diagnosis not present

## 2024-01-26 DIAGNOSIS — Z1152 Encounter for screening for COVID-19: Secondary | ICD-10-CM

## 2024-01-26 DIAGNOSIS — I10 Essential (primary) hypertension: Secondary | ICD-10-CM | POA: Diagnosis present

## 2024-01-26 DIAGNOSIS — I5042 Chronic combined systolic (congestive) and diastolic (congestive) heart failure: Secondary | ICD-10-CM | POA: Diagnosis not present

## 2024-01-26 DIAGNOSIS — D696 Thrombocytopenia, unspecified: Secondary | ICD-10-CM | POA: Diagnosis present

## 2024-01-26 DIAGNOSIS — E1169 Type 2 diabetes mellitus with other specified complication: Secondary | ICD-10-CM | POA: Diagnosis not present

## 2024-01-26 DIAGNOSIS — E785 Hyperlipidemia, unspecified: Secondary | ICD-10-CM | POA: Diagnosis not present

## 2024-01-26 DIAGNOSIS — E1165 Type 2 diabetes mellitus with hyperglycemia: Secondary | ICD-10-CM | POA: Diagnosis not present

## 2024-01-26 DIAGNOSIS — Z743 Need for continuous supervision: Secondary | ICD-10-CM | POA: Diagnosis not present

## 2024-01-26 HISTORY — DX: Type 2 diabetes mellitus with hyperosmolarity without nonketotic hyperglycemic-hyperosmolar coma (NKHHC): E11.00

## 2024-01-26 LAB — CBC WITH DIFFERENTIAL/PLATELET
Abs Immature Granulocytes: 0.02 K/uL (ref 0.00–0.07)
Basophils Absolute: 0 K/uL (ref 0.0–0.1)
Basophils Relative: 0 %
Eosinophils Absolute: 0.1 K/uL (ref 0.0–0.5)
Eosinophils Relative: 1 %
HCT: 39 % (ref 39.0–52.0)
Hemoglobin: 13.3 g/dL (ref 13.0–17.0)
Immature Granulocytes: 0 %
Lymphocytes Relative: 12 %
Lymphs Abs: 0.8 K/uL (ref 0.7–4.0)
MCH: 29.6 pg (ref 26.0–34.0)
MCHC: 34.1 g/dL (ref 30.0–36.0)
MCV: 86.9 fL (ref 80.0–100.0)
Monocytes Absolute: 0.7 K/uL (ref 0.1–1.0)
Monocytes Relative: 9 %
Neutro Abs: 5.5 K/uL (ref 1.7–7.7)
Neutrophils Relative %: 78 %
Platelets: 95 K/uL — ABNORMAL LOW (ref 150–400)
RBC: 4.49 MIL/uL (ref 4.22–5.81)
RDW: 13.7 % (ref 11.5–15.5)
WBC: 7.1 K/uL (ref 4.0–10.5)
nRBC: 0 % (ref 0.0–0.2)

## 2024-01-26 LAB — MAGNESIUM: Magnesium: 1.7 mg/dL (ref 1.7–2.4)

## 2024-01-26 LAB — BASIC METABOLIC PANEL WITH GFR
Anion gap: 18 — ABNORMAL HIGH (ref 5–15)
Anion gap: 16 — ABNORMAL HIGH (ref 5–15)
Anion gap: 9 (ref 5–15)
BUN: 47 mg/dL — ABNORMAL HIGH (ref 8–23)
BUN: 40 mg/dL — ABNORMAL HIGH (ref 8–23)
BUN: 30 mg/dL — ABNORMAL HIGH (ref 8–23)
CO2: 25 mmol/L (ref 22–32)
CO2: 25 mmol/L (ref 22–32)
CO2: 22 mmol/L (ref 22–32)
Calcium: 8.8 mg/dL — ABNORMAL LOW (ref 8.9–10.3)
Calcium: 8 mg/dL — ABNORMAL LOW (ref 8.9–10.3)
Calcium: 6.7 mg/dL — ABNORMAL LOW (ref 8.9–10.3)
Chloride: 80 mmol/L — ABNORMAL LOW (ref 98–111)
Chloride: 85 mmol/L — ABNORMAL LOW (ref 98–111)
Chloride: 102 mmol/L (ref 98–111)
Creatinine, Ser: 3.33 mg/dL — ABNORMAL HIGH (ref 0.61–1.24)
Creatinine, Ser: 3.1 mg/dL — ABNORMAL HIGH (ref 0.61–1.24)
Creatinine, Ser: 2.32 mg/dL — ABNORMAL HIGH (ref 0.61–1.24)
GFR, Estimated: 19 mL/min — ABNORMAL LOW (ref >60)
GFR, Estimated: 21 mL/min — ABNORMAL LOW (ref >60)
GFR, Estimated: 29 mL/min — ABNORMAL LOW (ref >60)
Glucose, Bld: 989 mg/dL (ref 70–99)
Glucose, Bld: 887 mg/dL (ref 70–99)
Glucose, Bld: 394 mg/dL — ABNORMAL HIGH (ref 70–99)
Potassium: 3.3 mmol/L — ABNORMAL LOW (ref 3.5–5.1)
Potassium: 2.9 mmol/L — ABNORMAL LOW (ref 3.5–5.1)
Potassium: 2.7 mmol/L — CL (ref 3.5–5.1)
Sodium: 123 mmol/L — ABNORMAL LOW (ref 135–145)
Sodium: 126 mmol/L — ABNORMAL LOW (ref 135–145)
Sodium: 133 mmol/L — ABNORMAL LOW (ref 135–145)

## 2024-01-26 LAB — I-STAT VENOUS BLOOD GAS, ED
Acid-Base Excess: 3 mmol/L — ABNORMAL HIGH (ref 0.0–2.0)
Bicarbonate: 27.7 mmol/L (ref 20.0–28.0)
Calcium, Ion: 1.06 mmol/L — ABNORMAL LOW (ref 1.15–1.40)
HCT: 40 % (ref 39.0–52.0)
Hemoglobin: 13.6 g/dL (ref 13.0–17.0)
O2 Saturation: 44 %
Potassium: 3.3 mmol/L — ABNORMAL LOW (ref 3.5–5.1)
Sodium: 122 mmol/L — ABNORMAL LOW (ref 135–145)
TCO2: 29 mmol/L (ref 22–32)
pCO2, Ven: 42.9 mmHg — ABNORMAL LOW (ref 44–60)
pH, Ven: 7.419 (ref 7.25–7.43)
pO2, Ven: 24 mmHg — CL (ref 32–45)

## 2024-01-26 LAB — GLUCOSE, CAPILLARY
Glucose-Capillary: 578 mg/dL (ref 70–99)
Glucose-Capillary: >600 mg/dL (ref 70–99)
Glucose-Capillary: >600 mg/dL (ref 70–99)
Glucose-Capillary: 496 mg/dL — ABNORMAL HIGH (ref 70–99)
Glucose-Capillary: 567 mg/dL (ref 70–99)
Glucose-Capillary: 484 mg/dL — ABNORMAL HIGH (ref 70–99)
Glucose-Capillary: 380 mg/dL — ABNORMAL HIGH (ref 70–99)
Glucose-Capillary: 360 mg/dL — ABNORMAL HIGH (ref 70–99)

## 2024-01-26 LAB — RAPID URINE DRUG SCREEN, HOSP PERFORMED
Amphetamines: NOT DETECTED
Barbiturates: NOT DETECTED
Benzodiazepines: NOT DETECTED
Cocaine: NOT DETECTED
Opiates: NOT DETECTED
Tetrahydrocannabinol: NOT DETECTED

## 2024-01-26 LAB — I-STAT CG4 LACTIC ACID, ED
Lactic Acid, Venous: 4.1 mmol/L (ref 0.5–1.9)
Lactic Acid, Venous: 1.7 mmol/L (ref 0.5–1.9)

## 2024-01-26 LAB — RESP PANEL BY RT-PCR (RSV, FLU A&B, COVID)  RVPGX2
Influenza A by PCR: NEGATIVE
Influenza B by PCR: NEGATIVE
Resp Syncytial Virus by PCR: NEGATIVE
SARS Coronavirus 2 by RT PCR: NEGATIVE

## 2024-01-26 LAB — ETHANOL: Alcohol, Ethyl (B): <15 mg/dL (ref <15)

## 2024-01-26 LAB — URINALYSIS, W/ REFLEX TO CULTURE (INFECTION SUSPECTED)
Bilirubin Urine: NEGATIVE
Glucose, UA: >=500 mg/dL — AB
Hgb urine dipstick: NEGATIVE
Ketones, ur: NEGATIVE mg/dL
Nitrite: NEGATIVE
Protein, ur: NEGATIVE mg/dL
Specific Gravity, Urine: 1.018 (ref 1.005–1.030)
pH: 5 (ref 5.0–8.0)

## 2024-01-26 LAB — TROPONIN I (HIGH SENSITIVITY)
Troponin I (High Sensitivity): 55 ng/L — ABNORMAL HIGH (ref <18)
Troponin I (High Sensitivity): 50 ng/L — ABNORMAL HIGH (ref <18)

## 2024-01-26 LAB — BETA-HYDROXYBUTYRIC ACID
Beta-Hydroxybutyric Acid: 1.48 mmol/L — ABNORMAL HIGH (ref 0.05–0.27)
Beta-Hydroxybutyric Acid: 0.34 mmol/L — ABNORMAL HIGH (ref 0.05–0.27)

## 2024-01-26 LAB — CBG MONITORING, ED
Glucose-Capillary: >600 mg/dL (ref 70–99)
Glucose-Capillary: >600 mg/dL (ref 70–99)
Glucose-Capillary: >600 mg/dL (ref 70–99)

## 2024-01-26 LAB — PROTIME-INR
INR: 1 (ref 0.8–1.2)
Prothrombin Time: 14.3 s (ref 11.4–15.2)

## 2024-01-26 LAB — OSMOLALITY: Osmolality: 324 mosm/kg (ref 275–295)

## 2024-01-26 MED ORDER — DEXTROSE IN LACTATED RINGERS 5 % IV SOLN: INTRAVENOUS | Status: DC

## 2024-01-26 MED ORDER — DEXTROSE 50 % IV SOLN
0.0000 mL | INTRAVENOUS | Status: DC | PRN
  Filled 2024-01-26: qty 50

## 2024-01-26 MED ORDER — POTASSIUM CHLORIDE 10 MEQ/100ML IV SOLN
10.0000 meq | INTRAVENOUS | Status: AC
  Administered 2024-01-26 – 2024-01-27 (×4): 10 meq via INTRAVENOUS
  Filled 2024-01-26 (×5): qty 100

## 2024-01-26 MED ORDER — HYDRALAZINE HCL 50 MG PO TABS: 50.0000 mg | ORAL_TABLET | Freq: Three times a day (TID) | ORAL | Status: DC

## 2024-01-26 MED ORDER — LACTATED RINGERS IV SOLN: INTRAVENOUS | Status: AC

## 2024-01-26 MED ORDER — ADULT MULTIVITAMIN W/MINERALS CH
1.0000 | ORAL_TABLET | Freq: Every day | ORAL | Status: DC
  Administered 2024-01-27 – 2024-01-31 (×5): 1 via ORAL
  Filled 2024-01-26 (×5): qty 1

## 2024-01-26 MED ORDER — LORATADINE 10 MG PO TABS
10.0000 mg | ORAL_TABLET | Freq: Every day | ORAL | Status: DC | PRN
  Administered 2024-01-29: 10 mg via ORAL
  Filled 2024-01-26: qty 1

## 2024-01-26 MED ORDER — GABAPENTIN 100 MG PO CAPS
200.0000 mg | ORAL_CAPSULE | Freq: Three times a day (TID) | ORAL | Status: DC
  Administered 2024-01-26 – 2024-01-31 (×14): 200 mg via ORAL
  Filled 2024-01-26 (×15): qty 2

## 2024-01-26 MED ORDER — LACTATED RINGERS IV BOLUS: 1000.0000 mL | INTRAVENOUS | Status: AC

## 2024-01-26 MED ORDER — POTASSIUM CHLORIDE 10 MEQ/100ML IV SOLN
10.0000 meq | INTRAVENOUS | Status: AC
  Administered 2024-01-26 (×4): 10 meq via INTRAVENOUS
  Filled 2024-01-26 (×4): qty 100

## 2024-01-26 MED ORDER — LIDOCAINE 5 % EX PTCH
1.0000 | MEDICATED_PATCH | CUTANEOUS | Status: DC
  Administered 2024-01-26 – 2024-01-30 (×5): 1 via TRANSDERMAL
  Filled 2024-01-26 (×5): qty 1

## 2024-01-26 MED ORDER — POTASSIUM CHLORIDE CRYS ER 20 MEQ PO TBCR
40.0000 meq | EXTENDED_RELEASE_TABLET | Freq: Once | ORAL | Status: AC
  Administered 2024-01-26: 40 meq via ORAL
  Filled 2024-01-26: qty 2

## 2024-01-26 MED ORDER — FENTANYL CITRATE PF 50 MCG/ML IJ SOSY
50.0000 ug | PREFILLED_SYRINGE | Freq: Once | INTRAMUSCULAR | Status: AC
  Administered 2024-01-26: 50 ug via INTRAVENOUS
  Filled 2024-01-26: qty 1

## 2024-01-26 MED ORDER — LACTATED RINGERS IV BOLUS
1000.0000 mL | Freq: Once | INTRAVENOUS | Status: AC
  Administered 2024-01-26: 1000 mL via INTRAVENOUS

## 2024-01-26 MED ORDER — CARVEDILOL 25 MG PO TABS
25.0000 mg | ORAL_TABLET | Freq: Two times a day (BID) | ORAL | Status: DC
Start: 2024-01-27 — End: 2024-01-26

## 2024-01-26 MED ORDER — LATANOPROST 0.005 % OP SOLN
1.0000 [drp] | Freq: Every day | OPHTHALMIC | Status: DC
  Administered 2024-01-26 – 2024-01-30 (×4): 1 [drp] via OPHTHALMIC
  Filled 2024-01-26: qty 2.5

## 2024-01-26 MED ORDER — ROSUVASTATIN CALCIUM 20 MG PO TABS
20.0000 mg | ORAL_TABLET | Freq: Every day | ORAL | Status: DC
  Administered 2024-01-27 – 2024-01-31 (×5): 20 mg via ORAL
  Filled 2024-01-26 (×5): qty 1

## 2024-01-26 MED ORDER — PANTOPRAZOLE SODIUM 40 MG PO TBEC
40.0000 mg | DELAYED_RELEASE_TABLET | Freq: Every day | ORAL | Status: DC
  Administered 2024-01-26 – 2024-01-31 (×6): 40 mg via ORAL
  Filled 2024-01-26 (×6): qty 1

## 2024-01-26 MED ORDER — POTASSIUM CHLORIDE 10 MEQ/100ML IV SOLN
INTRAVENOUS | Status: AC
  Filled 2024-01-26: qty 300

## 2024-01-26 MED ORDER — ACETAMINOPHEN 500 MG PO TABS
1000.0000 mg | ORAL_TABLET | Freq: Two times a day (BID) | ORAL | Status: DC | PRN
  Administered 2024-01-26: 1000 mg via ORAL
  Filled 2024-01-26: qty 2

## 2024-01-26 MED ORDER — CALCITRIOL 0.25 MCG PO CAPS
0.5000 ug | ORAL_CAPSULE | Freq: Every day | ORAL | Status: DC
  Administered 2024-01-27 – 2024-01-31 (×5): 0.5 ug via ORAL
  Filled 2024-01-26 (×5): qty 2

## 2024-01-26 MED ORDER — FUROSEMIDE 40 MG PO TABS: 60.0000 mg | ORAL_TABLET | Freq: Every day | ORAL | Status: DC

## 2024-01-26 MED ORDER — FENTANYL CITRATE PF 50 MCG/ML IJ SOSY: 50.0000 ug | PREFILLED_SYRINGE | INTRAMUSCULAR | Status: DC | PRN

## 2024-01-26 MED ORDER — MAGNESIUM SULFATE 2 GM/50ML IV SOLN
2.0000 g | Freq: Once | INTRAVENOUS | Status: AC
  Administered 2024-01-26: 2 g via INTRAVENOUS
  Filled 2024-01-26: qty 50

## 2024-01-26 MED ORDER — NITROGLYCERIN 0.4 MG SL SUBL: 0.4000 mg | SUBLINGUAL_TABLET | SUBLINGUAL | Status: DC | PRN

## 2024-01-26 MED ORDER — PAROXETINE HCL 30 MG PO TABS
30.0000 mg | ORAL_TABLET | Freq: Every day | ORAL | Status: DC
  Administered 2024-01-27 – 2024-01-31 (×5): 30 mg via ORAL
  Filled 2024-01-26 (×5): qty 1

## 2024-01-26 MED ORDER — INSULIN REGULAR(HUMAN) IN NACL 100-0.9 UT/100ML-% IV SOLN
INTRAVENOUS | Status: DC
  Administered 2024-01-26: 10.5 [IU]/h via INTRAVENOUS
  Administered 2024-01-26: 11.5 [IU]/h via INTRAVENOUS
  Filled 2024-01-26: qty 100

## 2024-01-26 MED ORDER — OXYCODONE HCL 5 MG PO TABS
10.0000 mg | ORAL_TABLET | Freq: Three times a day (TID) | ORAL | Status: DC
  Administered 2024-01-26: 10 mg via ORAL
  Filled 2024-01-26: qty 2

## 2024-01-26 NOTE — Progress Notes (Signed)
 Insulin  drip stopped per Md request due to potassium . Q 1 hour cbg . New orders placed

## 2024-01-26 NOTE — Progress Notes (Signed)
 Pt arrived to unit from .ED , CCMD called ,CHG given, pt oriented to unit,Will continue to monitor.   [name] POUR Mylinda Brook, RN    01/26/24 1615  Vitals  Temp 97.6 F (36.4 C)  Temp Source Oral  BP (!) 103/56  MAP (mmHg) 71  BP Location Right Arm  BP Method Automatic  Patient Position (if appropriate) Lying  Pulse Rate 69  Pulse Rate Source Monitor  ECG Heart Rate 70  Resp 20  Level of Consciousness  Level of Consciousness Alert  Oxygen Therapy  SpO2 96 %  O2 Device Room Air  Pain Assessment  Pain Scale 0-10  Pain Score 5  MEWS Score  MEWS Temp 0  MEWS Systolic 0  MEWS Pulse 0  MEWS RR 0  MEWS LOC 0  MEWS Score 0  MEWS Score Color Green

## 2024-01-26 NOTE — ED Notes (Signed)
 X-ray at bedside.

## 2024-01-26 NOTE — Assessment & Plan Note (Signed)
-  Will order CPAP for nighttime use

## 2024-01-26 NOTE — Assessment & Plan Note (Addendum)
 Softer on admission, likely in the setting of relative dehydration. -Hold hydralazine  50 mg daily, Losartan -hydrochlorothiazide 50-12.5 mg daily for now

## 2024-01-26 NOTE — ED Provider Notes (Signed)
 [place] EMERGENCY DEPARTMENT AT [facility] Provider Note   CSN: [id number] Arrival date & time: 01/26/24  1223     Patient presents with: needs RX and Weakness   Anthony [name] is a 73 y.o. male.    Weakness Patient presents to the ED with initial request for narcotic pain medication for treatment of chronic pain.  He also states that he has had decreased appetite which he attributes to pain.  In ED triage, patient was found to be hypotensive and hyperglycemic.  Patient states that he has not taken his insulin  in over a week.  He has been eating very little.  His chronic pain is located in his back.  He denies other areas of discomfort.  He denies any recent vomiting.  He states that he did not take blood pressure medication this morning.     Prior to Admission medications   Medication Sig Start Date End Date Taking? Authorizing Provider  acetaminophen  (TYLENOL ) 650 MG CR tablet Take 1,300 mg by mouth 2 (two) times daily as needed for pain.    [provider]  apixaban  (ELIQUIS ) 5 MG TABS tablet Take 1 tablet (5 mg total) by mouth 2 (two) times daily. 09/09/23   McDiarmid, Krystal BIRCH, MD  apixaban  (ELIQUIS ) 5 MG TABS tablet Take 1 tablet (5 mg total) by mouth 2 (two) times daily. 09/09/23   McDiarmid, Krystal BIRCH, MD  calcitRIOL  (ROCALTROL ) 0.5 MCG capsule Take 0.5 mcg by mouth daily. 09/02/22   [provider]  carvedilol  (COREG ) 25 MG tablet TAKE 1 TABLET 2 TIMES DAILY. 01/28/23   Rumball, Alison M, DO  Continuous Glucose Sensor (FREESTYLE LIBRE 3 PLUS SENSOR) MISC USE 1 SENSOR EVERY 14 DAYS 12/23/23   Rumball, Alison M, DO  furosemide  (LASIX ) 20 MG tablet Take 3 tablets (60 mg total) by mouth daily. 11/06/23   Rumball, Alison M, DO  gabapentin  (NEURONTIN ) 300 MG capsule TAKE 1 CAPSULE BY MOUTH 4 TIMES DAILY. 11/11/23   Rumball, Alison M, DO  hydrALAZINE  (APRESOLINE ) 50 MG tablet Take 1 tablet (50 mg total) by mouth 3 (three) times daily. 02/18/23   Fernande Elspeth BROCKS, MD   insulin  aspart (NOVOLOG  FLEXPEN) 100 UNIT/ML FlexPen INJECT INTO THE SKIN THREE TIMES DAILY BEFORE MEALS. INJECT 8-10 UNITS BEFORE BREAKFAST AND LUNCH AND INJECT 15-20 UNITS BEFORE DINNER. 06/19/23   Delores Suzann HERO, MD  insulin  degludec (TRESIBA  FLEXTOUCH) 200 UNIT/ML FlexTouch Pen Inject 32 Units into the skin daily. 11/11/23   Rumball, Alison M, DO  Insulin  Pen Needle (PEN NEEDLES) 32G X 4 MM MISC 1 Device by Does not apply route as directed. 02/03/23   Rumball, Alison M, DO  Lancets [facility] ULTRASOFT) lancets Use as instructed Patient not taking: Reported on 11/20/2023 01/13/19   Luke Vernell BRAVO, MD  latanoprost  (XALATAN ) 0.005 % ophthalmic solution Place 1 drop into both eyes at bedtime. 09/03/22   [provider]  loratadine  (CLARITIN ) 10 MG tablet Take 10 mg by mouth daily as needed for allergies.    [provider]  losartan -hydrochlorothiazide (HYZAAR) 50-12.5 MG tablet TAKE 1 TABLET BY MOUTH EVERY DAY 09/15/23   Rumball, Alison M, DO  Multiple Vitamin (MULTIVITAMIN) tablet Take 1 tablet by mouth daily.      [provider]  nitroGLYCERIN  (NITROSTAT ) 0.4 MG SL tablet Place 1 tablet (0.4 mg total) under the tongue every 5 (five) minutes as needed for chest pain. If no relief by 3rd tab, call 911 10/04/22   McDiarmid, Krystal  D, MD  omeprazole  (PRILOSEC) 40 MG capsule TAKE 1 CAPSULE BY MOUTH DAILY BEFORE SUPPER 09/18/23   Rumball, Alison M, DO  Oxycodone  HCl 10 MG TABS Take 10 mg by mouth 3 (three) times daily. 09/18/22   [provider]  PARoxetine  (PAXIL ) 30 MG tablet TAKE 1 TABLET BY MOUTH EVERY DAY 06/25/23   Rumball, Alison M, DO  potassium chloride  SA (KLOR-CON  M20) 20 MEQ tablet Take 1 tablet (20 mEq total) by mouth daily. 09/09/23   McDiarmid, Krystal BIRCH, MD  rosuvastatin  (CRESTOR ) 20 MG tablet Take 1 tablet (20 mg total) by mouth daily. 08/18/23   Vannie Reche RAMAN, NP  tamsulosin  (FLOMAX ) 0.4 MG CAPS capsule TAKE 1 CAPSULE BY MOUTH EVERYDAY AT BEDTIME 10/01/23   Rumball,  Alison M, DO    Allergies: Enalapril maleate    Review of Systems  Constitutional:  Positive for activity change, appetite change and fatigue.  Musculoskeletal:  Positive for back pain.  Neurological:  Positive for weakness (Generalized).  All other systems reviewed and are negative.   Updated Vital Signs BP (!) 102/57   Pulse 69   Temp 97.6 F (36.4 C) (Oral)   Resp (!) 21   SpO2 100%   Physical Exam Vitals and nursing note reviewed.  Constitutional:      General: He is not in acute distress.    Appearance: He is well-developed and normal weight. He is ill-appearing. He is not toxic-appearing or diaphoretic.  HENT:     Head: Normocephalic and atraumatic.     Right Ear: External ear normal.     Left Ear: External ear normal.     Nose: Nose normal.     Mouth/Throat:     Mouth: Mucous membranes are moist.  Eyes:     Extraocular Movements: Extraocular movements intact.     Conjunctiva/sclera: Conjunctivae normal.  Cardiovascular:     Rate and Rhythm: Normal rate and regular rhythm.  Pulmonary:     Effort: Pulmonary effort is normal. No respiratory distress.  Abdominal:     General: There is no distension.     Palpations: Abdomen is soft.     Tenderness: There is no abdominal tenderness.  Musculoskeletal:        General: No swelling. Normal range of motion.     Cervical back: Normal range of motion and neck supple.     Right lower leg: No edema.     Left lower leg: No edema.  Skin:    General: Skin is warm and dry.     Coloration: Skin is not jaundiced or pale.  Neurological:     General: No focal deficit present.     Mental Status: He is alert and oriented to person, place, and time.  Psychiatric:        Mood and Affect: Mood normal.        Behavior: Behavior normal.     (all labs ordered are listed, but only abnormal results are displayed) Labs Reviewed  BASIC METABOLIC PANEL WITH GFR - Abnormal; Notable for the following components:      Result Value    Sodium 123 (*)    Potassium 3.3 (*)    Chloride 80 (*)    Glucose, Bld 989 (*)    BUN 47 (*)    Creatinine, Ser 3.33 (*)    Calcium  8.8 (*)    GFR, Estimated 19 (*)    Anion gap 18 (*)    All other components within normal limits  OSMOLALITY -  Abnormal; Notable for the following components:   Osmolality 324 (*)    All other components within normal limits  CBC WITH DIFFERENTIAL/PLATELET - Abnormal; Notable for the following components:   Platelets 95 (*)    All other components within normal limits  BETA-HYDROXYBUTYRIC ACID - Abnormal; Notable for the following components:   Beta-Hydroxybutyric Acid 1.48 (*)    All other components within normal limits  CBG MONITORING, ED - Abnormal; Notable for the following components:   Glucose-Capillary >600 (*)    All other components within normal limits  I-STAT VENOUS BLOOD GAS, ED - Abnormal; Notable for the following components:   pCO2, Ven 42.9 (*)    pO2, Ven 24 (*)    Acid-Base Excess 3.0 (*)    Sodium 122 (*)    Potassium 3.3 (*)    Calcium , Ion 1.06 (*)    All other components within normal limits  I-STAT CG4 LACTIC ACID, ED - Abnormal; Notable for the following components:   Lactic Acid, Venous 4.1 (*)    All other components within normal limits  TROPONIN I (HIGH SENSITIVITY) - Abnormal; Notable for the following components:   Troponin I (High Sensitivity) 55 (*)    All other components within normal limits  RESP PANEL BY RT-PCR (RSV, FLU A&B, COVID)  RVPGX2  CULTURE, BLOOD (ROUTINE X 2)  CULTURE, BLOOD (ROUTINE X 2)  ETHANOL  MAGNESIUM   PROTIME-INR  BASIC METABOLIC PANEL WITH GFR  BASIC METABOLIC PANEL WITH GFR  BASIC METABOLIC PANEL WITH GFR  BETA-HYDROXYBUTYRIC ACID  RAPID URINE DRUG SCREEN, HOSP PERFORMED  URINALYSIS, W/ REFLEX TO CULTURE (INFECTION SUSPECTED)  I-STAT CG4 LACTIC ACID, ED  TROPONIN I (HIGH SENSITIVITY)    EKG: None  Radiology: DG Chest Port 1 View Result Date: 01/26/2024 EXAM: 1 VIEW XRAY OF  THE CHEST 01/26/2024 01:15:00 PM COMPARISON: None available. CLINICAL HISTORY: Questionable sepsis - evaluate for abnormality. FINDINGS: LUNGS AND PLEURA: No focal pulmonary opacity. No pulmonary edema. No pleural effusion. No pneumothorax. HEART AND MEDIASTINUM: Cardiomegaly. Calcified aorta. Left pacer in place. BONES AND SOFT TISSUES: No acute osseous abnormality. IMPRESSION: 1. No acute findings. 2. Cardiomegaly, calcified aorta, and left pacer in place. Electronically signed by: Lonni Necessary MD 01/26/2024 02:04 PM EDT RP Workstation: [id number]     Procedures   Medications Ordered in the ED  insulin  regular, human (MYXREDLIN ) 100 units/ 100 mL infusion (has no administration in time range)  lactated ringers  infusion (has no administration in time range)  dextrose  5 % in lactated ringers  infusion (has no administration in time range)  dextrose  50 % solution 0-50 mL (has no administration in time range)  lactated ringers  bolus 1,000 mL (has no administration in time range)  potassium chloride  10 mEq in 100 mL IVPB (has no administration in time range)  fentaNYL  (SUBLIMAZE ) injection 50 mcg (has no administration in time range)  lactated ringers  bolus 1,000 mL (1,000 mLs Intravenous New Bag/Given 01/26/24 1334)  fentaNYL  (SUBLIMAZE ) injection 50 mcg (50 mcg Intravenous Given 01/26/24 1334)                                    Medical Decision Making Amount and/or Complexity of Data Reviewed Labs: ordered. Radiology: ordered.  Risk Prescription drug management. Decision regarding hospitalization.   This patient presents to the ED for concern of back pain, this involves an extensive number of treatment options, and is a complaint that carries  with it a high risk of complications and morbidity.  The differential diagnosis includes chronic pain, occult injury, deconditioning, infection, metabolic derangements   Co morbidities / Chronic conditions that complicate the patient  evaluation  HTN, atrial fibrillation, CHF, CAD, DM, HLD, CVA, OSA, sleep apnea, arthritis, BPH   Additional history obtained:  Additional history obtained from EMR External records from outside source obtained and reviewed including N/A   Lab Tests:  I Ordered, and personally interpreted labs.  The pertinent results include: Hyperglycemia without acidosis and elevated osmolality consistent with HHS.  Creatinine is increased from baseline.  Pseudohyponatremia is present.  Hypokalemia is present.  Hemoglobin is normal.  No leukocytosis is present.   Imaging Studies ordered:  I ordered imaging studies including chest x-ray I independently visualized and interpreted imaging which showed no acute findings I agree with the radiologist interpretation   Cardiac Monitoring: / EKG:  The patient was maintained on a cardiac monitor.  I personally viewed and interpreted the cardiac monitored which showed an underlying rhythm of: Paced rhythm   Problem List / ED Course / Critical interventions / Medication management  Patient presenting for worsened chronic back pain after reportedly running out of his pain medication for the past month.  He also states that he has not taken his insulin  in over a week.  He has been having poor p.o. intake which he attributes to his chronic pain.  He denies any other areas of discomfort.  On arrival in the ED, patient is found to be hypotensive and hyperglycemic.  On exam, he is chronically ill-appearing.  As abdomen is soft and nontender.  Current breathing is unlabored.  IV fluids were ordered.  Fentanyl  ordered for analgesia.  Workup initiated.  Patient's blood pressure improved after IV fluids.  Lab work is consistent with HHS.  Insulin  gtt. and additional IV fluids were ordered.  Patient to be admitted for further management. I ordered medication including IV fluids and insulin  for HHS; potassium chloride  for hypokalemia; fentanyl  for analgesia Reevaluation of  the patient after these medicines showed that the patient improved I have reviewed the patients home medicines and have made adjustments as needed  Social Determinants of Health:  Has PCP  CRITICAL CARE Performed by: Bernardino Fireman   Total critical care time: 33 minutes  Critical care time was exclusive of separately billable procedures and treating other patients.  Critical care was necessary to treat or prevent imminent or life-threatening deterioration.  Critical care was time spent personally by me on the following activities: development of treatment plan with patient and/or surrogate as well as nursing, discussions with consultants, evaluation of patient's response to treatment, examination of patient, obtaining history from patient or surrogate, ordering and performing treatments and interventions, ordering and review of laboratory studies, ordering and review of radiographic studies, pulse oximetry and re-evaluation of patient's condition.       Final diagnoses:  Hyperosmolar hyperglycemic state (HHS) [facility])    ED Discharge Orders     None          Fireman Bernardino, MD 01/26/24 1515

## 2024-01-26 NOTE — ED Notes (Signed)
 CCMD called and notified

## 2024-01-26 NOTE — Progress Notes (Signed)
 Endo-tool restarted and insulin  drip started back at 10.5 per MD request .

## 2024-01-26 NOTE — Progress Notes (Signed)
 Latest potassium level was 2.7. The doctors ordered to stop the insulin  drip for now and to give the patient a one time 40 mEq of oral Potassium tablets and five runs of IV 10 mEq potassium chloride . Since his last magnesium  level was 1.7, the doctors also ordered a one time IV magnesium  sulfate 2 G in 50 mL. [place] it was okay to continue checking the blood glucose levels.  Will continue to monitor.

## 2024-01-26 NOTE — Progress Notes (Addendum)
 Patient being admitted to our service. Chart review:  Re diabetes control:  Latest Reference Range & Units 12/01/17 14:33 03/05/18 13:42 06/23/18 15:00 12/23/18 13:45 05/10/19 13:44 09/16/19 11:51 12/15/19 16:10 09/22/20 13:34 12/27/20 13:54 06/21/21 11:37 10/03/21 14:14 01/07/22 09:57 03/28/22 11:23 06/17/22 11:37 05/02/23 09:27 09/01/23 11:24  HbA1c, POC (controlled diabetic range) 0.0 - 7.0 % 7.2 ! 6.8 8.2 ! 8.0 ! 7.8 ! 8.1 ! 7.6 ! 8.6 ! 9.1 ! 8.7 ! 8.7 ! 9.0 ! 10.7 ! 11.0 ! 9.0 ! 9.2 !  !: Data is abnormal  Re chronic back pain: Remote hx sciatic b=nerve injury. Followed by [facility] for back pain, hx of steroid injections 10/01/2023, 08/15/2023:  ( BIL TF L4/L5)  - CT scan 02/04/2022: Spondylolisthesis L5-S1, bilateral pars defect resulting moderate to severe  spinal stenosis and severe bilateral foraminal stenosis L4-L5, and L5-S1. Large osteophyte complex between right sacral ala and rigt iliac bone.  CV: h/o chronic systolic heart failure and permanent atrial fibrillation s/p AVN ablation with CRT-D (ICD) and Biventricular pacemaker dependent.

## 2024-01-26 NOTE — ED Triage Notes (Addendum)
 Patient arrived by [facility] from home requesting oxycodone  for chronic pain. Reports that he has been out for a month and missed his pain management appointment. Patient also insulin  dependent diabetic. Complains of decreased appetite due to pain. Alert and oriented

## 2024-01-26 NOTE — ED Notes (Signed)
 Notified patients spouse of needed hospital admission and aware of assigned bed.  Spouse reports that she was unaware patient out of insulin  x 1 month.

## 2024-01-26 NOTE — Assessment & Plan Note (Addendum)
 Likely secondary to insulin  non-adherence.  Patient's perseveration and difficult interview likely due to HHS; however, given his history of stroke, we will keep this on differential. -Glucose checks every hour -Repeat BMP every 4 hours to monitor K -40 mEq oral KCL tablets + two 10 mEq IV KCL infusions given K2.9; hold insulin  as needed if K < 3.5 -NPO until glucose resolves -IV fluids -Continue insulin  infusion until mental status is normalized, serum osmolality less than 320, patient is able to tolerate p.o. intake -Stat CT head without contrast to further assess acute stroke

## 2024-01-26 NOTE — ED Notes (Signed)
 Extra  2 SST, Red, LG & Lav top drawn

## 2024-01-26 NOTE — Assessment & Plan Note (Addendum)
 Appears EF was 25 to 30% in 2018 but this has recovered to normal EF on last echo 2022.  Patient appears euvolemic today.  He has an ICD and biventricular pacemaker in place. -Hold carvedilol  25 mg twice daily and Lasix  60 mg daily given lower BP for now -Rosuvastatin  20 mg daily -Eliquis  5 mg twice daily

## 2024-01-26 NOTE — Assessment & Plan Note (Signed)
 Without red flag signs on exam.  Lumbar spine in 2015 with advanced generative disc disease of the lower lumbar spine L5-S1 where there is grade 1 spondylolisthesis and possible pars defects.  He continues to follow with orthopedics. -Continue home oxycodone  and gabapentin  for pain -Add on lidocaine  patches daily

## 2024-01-26 NOTE — Plan of Care (Signed)
 FMTS Interim Progress Note  S: Reports feeling better. Has some frontal HA. No nausea.   O: BP 108/73 (BP Location: Right Arm)   Pulse 70   Temp 97.9 F (36.6 C) (Oral)   Resp 18   Ht 6' (1.829 m)   Wt 82.3 kg   SpO2 100%   BMI 24.61 kg/m    General: No acute distress, resting comfortably.  CV: Normal S1/S2. No extra heart sounds. Warm and well-perfused. Pulm: Breathing comfortably on room air. CTAB anteriorly. No increased WOB. Abd: Soft, non-tender, non-distended. Skin:  Warm, dry. No extremity edema.   A/P:  HHS, T2DM BG continue to downtrend, most recently in 400s. Repeat K remains low at 2.7. Spoke with pharmacy regarding plan as outlined below. CT Head without acute findings, suspect patient's prior perseveration secondary to HHS, now improving.  -Pause Endotool, CTM BG hourly  -Oral K 40 mEq + IV K 10 mEq/hr x 5  -IV Mg 2 mg  -NPO  -Tylenol  BID PRN  HTN  Remains low-normal.  -Continue to hold home BP with plan to resume as indicated.    Continue treatment plan as otherwise indicated in FMTS H&P Note.   Diona Perkins, MD 01/26/2024, 9:19 PM PGY-2, [facility] Family Medicine Service pager [phone]

## 2024-01-26 NOTE — Assessment & Plan Note (Addendum)
 With superimposed AKI, likely due to relative dehydration. -Avoid nephrotoxic agents -Fluids as above - AM BMP

## 2024-01-26 NOTE — H&P (Cosign Needed Addendum)
 Hospital Admission History and Physical Service Pager: [phone]  Patient name: Anthony [name] Medical record number: [id number] Date of Birth: 1950-07-25 Age: 73 y.o. Gender: male  Primary Care Provider: Donald Lai, D.O. Consultants: None Code Status: Full Preferred Emergency Contact: [name] Lowers (spouse)  Chief Complaint: chronic back pain  Differential and Medical Decision Making:   Anthony [name] is a 73 y.o. male with a history of insulin -dependent type 2 diabetes presenting with hypotension, hyperglycemia, weakness, and decreased appetite in the setting of insulin  non-adherence. Differential for this patient's presentation includes HHS (most likely etiology given blood glucose >900, hypotension, fatigue, hyperosmolality), infection (less likely as patient is afebrile, normal WBC count), and DKA (unlikely due to normal pH). Patient reports stopping insulin  approximately one week prior to admission due to his back pain, which is the likely cause for precipitating HHS.  Assessment & Plan Type 2 diabetes mellitus with hyperosmolar hyperglycemic state (HHS) (HCC) Likely secondary to insulin  non-adherence.  Patient's perseveration and difficult interview likely due to HHS; however, given his history of stroke, we will keep this on differential. -Glucose checks every hour -Repeat BMP every 4 hours to monitor K -40 mEq oral KCL tablets + two 10 mEq IV KCL infusions given K2.9; hold insulin  as needed if K < 3.5 -NPO until glucose resolves -IV fluids -Continue insulin  infusion until mental status is normalized, serum osmolality less than 320, patient is able to tolerate p.o. intake -Stat CT head without contrast to further assess acute stroke Chronic back pain Without red flag signs on exam.  Lumbar spine in 2015 with advanced generative disc disease of the lower lumbar spine L5-S1 where there is grade 1 spondylolisthesis and possible pars defects.  He continues to follow with  orthopedics. -Continue home oxycodone  and gabapentin  for pain -Add on lidocaine  patches daily HYPERTENSION, BENIGN SYSTEMIC Softer on admission, likely in the setting of relative dehydration. -Hold hydralazine  50 mg daily, Losartan -hydrochlorothiazide 50-12.5 mg daily for now OSA on CPAP -Will order CPAP for nighttime use Chronic kidney disease (CKD), stage IV (severe) (HCC) With superimposed AKI, likely due to relative dehydration. -Avoid nephrotoxic agents -Fluids as above - AM BMP Congestive heart failure, NYHA class 3, chronic, systolic (HCC) Atrial fibrillation, permanent (HCC) Appears EF was 25 to 30% in 2018 but this has recovered to normal EF on last echo 2022.  Patient appears euvolemic today.  He has an ICD and biventricular pacemaker in place. -Hold carvedilol  25 mg twice daily and Lasix  60 mg daily given lower BP for now -Rosuvastatin  20 mg daily -Eliquis  5 mg twice daily  FEN/GI: Carb modified diet VTE Prophylaxis: SCDs for now given platelets less than 100 K  Disposition: Progressive  History of Present Illness:  Anthony [name] is a 73 y.o. male presenting with complaints of chronic lower back pain.   Patient reports he initially came into the ED, as he had run out of his home oxycodone  prescription and wanted to have it refilled due to ongoing back pain. He reports he has not had much appetite for the past week, and has been nibbling on food. He states he has not taken insulin  in approximately one week, but does not clearly state why he initially stopped taking insulin . On interview, he is somewhat perseverative on his back pain, and it appears he may have been neglecting taking his diabetic medications as he was focused primarily on his back pain. He denies nausea, vomiting, diarrhea, abdominal pain.  In the ED, patient  was found to be hypotensive with systolic low in the 70s, blood glucose of 989, potassium 2.9, sodium 123, creatinine 3.3, BUN 47, anion gap 18,  osmolality 324, beta hydroxy butyric acid 1.48, and lactic acid 4.1, and chronic thrombocytopenia (platelets 95). Patient received two 1 L boluses of LR, a D5LR infusion at 125 mL/hr, KCL supplementation, and IV insulin .  Review Of Systems: Per HPI with the following additions: N/A  Pertinent Past Medical History: Type 2 diabetes mellitus Myocardial infarction CKD Stage III  Combined systolic and diastolic CHF Remainder reviewed in history tab.   Pertinent Past Surgical History: ICD procedure in 2019  Remainder reviewed in history tab.   Pertinent Social History: Tobacco use: Yes/No/Former Alcohol use: No alcohol reported per chart review Other Substance use: Per chart review patient is former smoker (quit in 2020) Lives with wife  Pertinent Family History: Mother - CVA, diabetes, heart attack Father - heart attack  Important Outpatient Medications: Eliquis  5 mg twice daily Calcitriol  0.5 mcg daily Carvedilol  25 mg twice daily Furosemide  20 mg 3 times daily Gabapentin  300 mg QID Hydralazine  50 mg daily Insulin  aspart 100 units 3 times daily before meals Insulin  degludec 32 units daily Latanoprost  1 drop into both eyes at bedtime Loratadine  10 mg daily as needed Losartan -hydrochlorothiazide 50-12.5 mg daily Nitroglycerin  0.4 mg sublingual Omeprazole  40 mg daily Oxycodone  10 mg 3 times daily Paxil  30 mg daily Potassium chloride  20 mEq daily Rosuvastatin  20 mg daily Flomax  0.4 mg daily at bedtime Unfortunately difficult to obtain accurate complete medication reconciliation by both us  and the pharmacy technician; pharmacy technician will attempt at a later time.  Objective: BP (!) 103/56 (BP Location: Right Arm)   Pulse 69   Temp 97.6 F (36.4 C) (Oral)   Resp 20   Ht 6' (1.829 m)   Wt 82.3 kg   SpO2 96%   BMI 24.61 kg/m  Exam: General: Alert, no acute distress Eyes: EOM grossly intact ENTM: MMM Neck: Full ROM; supple Cardiovascular: Normal rate and rhythm;  no M/R/G Respiratory: Clear to auscultation bilaterally Gastrointestinal: Non-distended; non-tender; normal bowel sounds MSK: ROM grossly intact, patient with pain to palpation of bilateral paraspinal muscles Derm: Dry, flaky skin on bilateral shins and feet Neuro: Sensation intact bilaterally; motor strength 5/5 in bilateral lower extremities Psych: Euthymic mood, congruent and appropriate affect, perseverative on pain, able to follow commands well  Labs:  CBC BMET  Recent Labs  Lab 01/26/24 1243 01/26/24 1408  WBC 7.1  --   HGB 13.3 13.6  HCT 39.0 40.0  PLT 95*  --    Recent Labs  Lab 01/26/24 1514  NA 126*  K 2.9*  CL 85*  CO2 25  BUN 40*  CREATININE 3.10*  GLUCOSE 887*  CALCIUM  8.0*    Pertinent additional labs osmolality 324, beta-hydroxybutyric acid 1.48, troponin 55.   EKG: Pending  Imaging Studies Performed:  Chest X-Ray: Impression from Radiologist: 1. No acute findings. 2. Cardiomegaly, calcified aorta, and left pacer in place.   Mannie Ashley SAILOR, MD 01/26/2024, 6:12 PM PGY-1, [facility]  FPTS Intern pager: [phone], text pages welcome Secure chat group [facility] Teaching Service   I agree with the assessment and plan as documented above.  Stuart Redo, MD PGY-3, [facility]

## 2024-01-27 ENCOUNTER — Other Ambulatory Visit: Payer: Self-pay

## 2024-01-27 ENCOUNTER — Inpatient Hospital Stay (HOSPITAL_COMMUNITY)

## 2024-01-27 DIAGNOSIS — I4821 Permanent atrial fibrillation: Secondary | ICD-10-CM | POA: Diagnosis not present

## 2024-01-27 DIAGNOSIS — M5440 Lumbago with sciatica, unspecified side: Secondary | ICD-10-CM

## 2024-01-27 DIAGNOSIS — G4733 Obstructive sleep apnea (adult) (pediatric): Secondary | ICD-10-CM

## 2024-01-27 DIAGNOSIS — R079 Chest pain, unspecified: Secondary | ICD-10-CM | POA: Diagnosis not present

## 2024-01-27 DIAGNOSIS — G8929 Other chronic pain: Secondary | ICD-10-CM

## 2024-01-27 DIAGNOSIS — N184 Chronic kidney disease, stage 4 (severe): Secondary | ICD-10-CM | POA: Diagnosis not present

## 2024-01-27 DIAGNOSIS — E1100 Type 2 diabetes mellitus with hyperosmolarity without nonketotic hyperglycemic-hyperosmolar coma (NKHHC): Secondary | ICD-10-CM

## 2024-01-27 HISTORY — DX: Chest pain, unspecified: R07.9

## 2024-01-27 LAB — GLUCOSE, CAPILLARY
Glucose-Capillary: 128 mg/dL — ABNORMAL HIGH (ref 70–99)
Glucose-Capillary: 129 mg/dL — ABNORMAL HIGH (ref 70–99)
Glucose-Capillary: 132 mg/dL — ABNORMAL HIGH (ref 70–99)
Glucose-Capillary: 134 mg/dL — ABNORMAL HIGH (ref 70–99)
Glucose-Capillary: 148 mg/dL — ABNORMAL HIGH (ref 70–99)
Glucose-Capillary: 159 mg/dL — ABNORMAL HIGH (ref 70–99)
Glucose-Capillary: 168 mg/dL — ABNORMAL HIGH (ref 70–99)
Glucose-Capillary: 169 mg/dL — ABNORMAL HIGH (ref 70–99)
Glucose-Capillary: 206 mg/dL — ABNORMAL HIGH (ref 70–99)
Glucose-Capillary: 230 mg/dL — ABNORMAL HIGH (ref 70–99)
Glucose-Capillary: 272 mg/dL — ABNORMAL HIGH (ref 70–99)
Glucose-Capillary: 273 mg/dL — ABNORMAL HIGH (ref 70–99)
Glucose-Capillary: 288 mg/dL — ABNORMAL HIGH (ref 70–99)
Glucose-Capillary: 360 mg/dL — ABNORMAL HIGH (ref 70–99)
Glucose-Capillary: 366 mg/dL — ABNORMAL HIGH (ref 70–99)
Glucose-Capillary: 380 mg/dL — ABNORMAL HIGH (ref 70–99)
Glucose-Capillary: 385 mg/dL — ABNORMAL HIGH (ref 70–99)

## 2024-01-27 LAB — BASIC METABOLIC PANEL WITH GFR
Anion gap: <0 — ABNORMAL LOW (ref 5–15)
Anion gap: 13 (ref 5–15)
Anion gap: 10 (ref 5–15)
Anion gap: 11 (ref 5–15)
Anion gap: 8 (ref 5–15)
BUN: 24 mg/dL — ABNORMAL HIGH (ref 8–23)
BUN: 37 mg/dL — ABNORMAL HIGH (ref 8–23)
BUN: 34 mg/dL — ABNORMAL HIGH (ref 8–23)
BUN: 35 mg/dL — ABNORMAL HIGH (ref 8–23)
BUN: 35 mg/dL — ABNORMAL HIGH (ref 8–23)
CO2: 15 mmol/L — ABNORMAL LOW (ref 22–32)
CO2: 24 mmol/L (ref 22–32)
CO2: 25 mmol/L (ref 22–32)
CO2: 25 mmol/L (ref 22–32)
CO2: 27 mmol/L (ref 22–32)
Calcium: 6.3 mg/dL — CL (ref 8.9–10.3)
Calcium: 8.8 mg/dL — ABNORMAL LOW (ref 8.9–10.3)
Calcium: 8.8 mg/dL — ABNORMAL LOW (ref 8.9–10.3)
Calcium: 9.1 mg/dL (ref 8.9–10.3)
Calcium: 9.1 mg/dL (ref 8.9–10.3)
Chloride: 96 mmol/L — ABNORMAL LOW (ref 98–111)
Chloride: 93 mmol/L — ABNORMAL LOW (ref 98–111)
Chloride: 98 mmol/L (ref 98–111)
Chloride: 97 mmol/L — ABNORMAL LOW (ref 98–111)
Chloride: 99 mmol/L (ref 98–111)
Creatinine, Ser: 1.56 mg/dL — ABNORMAL HIGH (ref 0.61–1.24)
Creatinine, Ser: 2.8 mg/dL — ABNORMAL HIGH (ref 0.61–1.24)
Creatinine, Ser: 2.61 mg/dL — ABNORMAL HIGH (ref 0.61–1.24)
Creatinine, Ser: 2.61 mg/dL — ABNORMAL HIGH (ref 0.61–1.24)
Creatinine, Ser: 2.57 mg/dL — ABNORMAL HIGH (ref 0.61–1.24)
GFR, Estimated: 47 mL/min — ABNORMAL LOW (ref >60)
GFR, Estimated: 23 mL/min — ABNORMAL LOW (ref >60)
GFR, Estimated: 25 mL/min — ABNORMAL LOW (ref >60)
GFR, Estimated: 25 mL/min — ABNORMAL LOW (ref >60)
GFR, Estimated: 26 mL/min — ABNORMAL LOW (ref >60)
Glucose, Bld: 227 mg/dL — ABNORMAL HIGH (ref 70–99)
Glucose, Bld: 371 mg/dL — ABNORMAL HIGH (ref 70–99)
Glucose, Bld: 203 mg/dL — ABNORMAL HIGH (ref 70–99)
Glucose, Bld: 141 mg/dL — ABNORMAL HIGH (ref 70–99)
Glucose, Bld: 119 mg/dL — ABNORMAL HIGH (ref 70–99)
Potassium: >7.5 mmol/L (ref 3.5–5.1)
Potassium: 4.2 mmol/L (ref 3.5–5.1)
Potassium: 3.4 mmol/L — ABNORMAL LOW (ref 3.5–5.1)
Potassium: 3.7 mmol/L (ref 3.5–5.1)
Potassium: 3.8 mmol/L (ref 3.5–5.1)
Sodium: 107 mmol/L — CL (ref 135–145)
Sodium: 130 mmol/L — ABNORMAL LOW (ref 135–145)
Sodium: 133 mmol/L — ABNORMAL LOW (ref 135–145)
Sodium: 133 mmol/L — ABNORMAL LOW (ref 135–145)
Sodium: 134 mmol/L — ABNORMAL LOW (ref 135–145)

## 2024-01-27 LAB — MAGNESIUM: Magnesium: 2.2 mg/dL (ref 1.7–2.4)

## 2024-01-27 LAB — CBC
HCT: 34.5 % — ABNORMAL LOW (ref 39.0–52.0)
Hemoglobin: 12.3 g/dL — ABNORMAL LOW (ref 13.0–17.0)
MCH: 30.2 pg (ref 26.0–34.0)
MCHC: 35.7 g/dL (ref 30.0–36.0)
MCV: 84.8 fL (ref 80.0–100.0)
Platelets: 85 K/uL — ABNORMAL LOW (ref 150–400)
RBC: 4.07 MIL/uL — ABNORMAL LOW (ref 4.22–5.81)
RDW: 13.7 % (ref 11.5–15.5)
WBC: 8 K/uL (ref 4.0–10.5)
nRBC: 0 % (ref 0.0–0.2)

## 2024-01-27 LAB — OSMOLALITY: Osmolality: 292 mosm/kg (ref 275–295)

## 2024-01-27 LAB — BETA-HYDROXYBUTYRIC ACID: Beta-Hydroxybutyric Acid: 2.07 mmol/L — ABNORMAL HIGH (ref 0.05–0.27)

## 2024-01-27 LAB — TROPONIN I (HIGH SENSITIVITY)
Troponin I (High Sensitivity): 66 ng/L — ABNORMAL HIGH (ref <18)
Troponin I (High Sensitivity): 65 ng/L — ABNORMAL HIGH (ref <18)

## 2024-01-27 MED ORDER — GLUCERNA SHAKE PO LIQD
237.0000 mL | Freq: Once | ORAL | Status: AC
  Administered 2024-01-27: 237 mL via ORAL

## 2024-01-27 MED ORDER — OXYCODONE HCL 5 MG PO TABS
5.0000 mg | ORAL_TABLET | Freq: Three times a day (TID) | ORAL | Status: DC
  Administered 2024-01-27 – 2024-01-31 (×12): 5 mg via ORAL
  Filled 2024-01-27 (×12): qty 1

## 2024-01-27 MED ORDER — POTASSIUM CHLORIDE CRYS ER 20 MEQ PO TBCR
40.0000 meq | EXTENDED_RELEASE_TABLET | Freq: Once | ORAL | Status: AC
  Administered 2024-01-27: 40 meq via ORAL
  Filled 2024-01-27: qty 2

## 2024-01-27 MED ORDER — INSULIN ASPART 100 UNIT/ML IJ SOLN
0.0000 [IU] | Freq: Three times a day (TID) | INTRAMUSCULAR | Status: DC
  Administered 2024-01-27: 2 [IU] via SUBCUTANEOUS
  Administered 2024-01-28: 3 [IU] via SUBCUTANEOUS
  Administered 2024-01-28: 2 [IU] via SUBCUTANEOUS
  Administered 2024-01-28: 3 [IU] via SUBCUTANEOUS
  Administered 2024-01-29 (×2): 2 [IU] via SUBCUTANEOUS
  Administered 2024-01-30: 5 [IU] via SUBCUTANEOUS
  Administered 2024-01-30 – 2024-01-31 (×2): 2 [IU] via SUBCUTANEOUS
  Administered 2024-01-31: 9 [IU] via SUBCUTANEOUS

## 2024-01-27 MED ORDER — IOHEXOL 350 MG/ML SOLN
75.0000 mL | Freq: Once | INTRAVENOUS | Status: AC | PRN
  Administered 2024-01-27: 75 mL via INTRAVENOUS

## 2024-01-27 MED ORDER — APIXABAN 5 MG PO TABS: 5.0000 mg | ORAL_TABLET | Freq: Two times a day (BID) | ORAL | Status: DC

## 2024-01-27 MED ORDER — GLUCERNA PO LIQD: 237.0000 mL | Freq: Once | ORAL | Status: DC

## 2024-01-27 MED ORDER — INSULIN GLARGINE 100 UNIT/ML ~~LOC~~ SOLN
35.0000 [IU] | Freq: Every day | SUBCUTANEOUS | Status: DC
  Administered 2024-01-27: 35 [IU] via SUBCUTANEOUS
  Filled 2024-01-27 (×2): qty 0.35

## 2024-01-27 NOTE — Plan of Care (Signed)
 Saw patient at bedside with Dr. Rosalynn, discussed the indications of imaging including being the chief complaint of presentation to ED.  Patient refused MRI because he has no interest in surgical intervention, after discussion of other possible causes of pain beyond worsening previously imaged pathology such as infection, patient agreed to lumbar CT with contrast.

## 2024-01-27 NOTE — Progress Notes (Signed)
 Based on current lab values, the physicians ordered to restart the patient's insulin  drip and endotool.  Recent glucose level at 385. According to Endo Tool, insulin  drip to go at 10.5 mL/hr.  Will continue to monitor.  Kristene Sotero BRAVO, RN

## 2024-01-27 NOTE — Plan of Care (Signed)
  Problem: Education: Goal: Knowledge of General Education information will improve Description: Including pain rating scale, medication(s)/side effects and non-pharmacologic comfort measures Outcome: Progressing   Problem: Health Behavior/Discharge Planning: Goal: Ability to manage health-related needs will improve Outcome: Progressing   Problem: Clinical Measurements: Goal: Will remain free from infection Outcome: Progressing   Problem: Nutrition: Goal: Adequate nutrition will be maintained Outcome: Progressing   Problem: Pain Managment: Goal: General experience of comfort will improve and/or be controlled Outcome: Progressing   Problem: Skin Integrity: Goal: Risk for impaired skin integrity will decrease Outcome: Progressing

## 2024-01-27 NOTE — Plan of Care (Signed)
 Noted CBGs 206>169. D5LR not started per chart MAR. Called RN Ann and spoke with her trainee RN, ?Jackquelyn Casino. Asked to start D5LR at this time and STOP LR infusion alone given CBG<250. Appreciate RN assistance.

## 2024-01-27 NOTE — Assessment & Plan Note (Signed)
 Reported single, brief episode of non-radiating, pressure-like sternal chest pain. He has hx of HF as noted above and elevated troponin on admission. -Repeat EKG -Repeat troponin

## 2024-01-27 NOTE — Plan of Care (Addendum)
 Notified by nursing of critical lab values of recent BMP including: K >7.5, Na 107, Ca 6.3  Suspect lab sample may have hemolyzed. Reordered stat BMP for recheck. Will go to patient bedside for further assessment. Also called lab who confirmed sample was not hemolyzed and was analyzed twice.  Addendum 9776: Patient seen at bedside, was sleeping. Easy to arouse though remained sleepy, was able to state name and birth month but otherwise had difficulty following commands. Spoke with attending Dr Rosalynn, also recommends repeat lab redraw and EKG. If repeat electrolytes are consistent, may consult CCM for further management.

## 2024-01-27 NOTE — Assessment & Plan Note (Addendum)
 Without red flag signs on exam, but appears to be worsening recently. Lumbar spine in 2015 with advanced generative disc disease of the lower lumbar spine L5-S1 where there is grade 1 spondylolisthesis and possible pars defects. He continues to follow with orthopedics. -Continue gabapentin  for pain  -Decrease oxy to 5 mg TID d/t causing excessive sedation -Add on lidocaine  patches daily --MRI lumbar spine

## 2024-01-27 NOTE — Assessment & Plan Note (Signed)
-  Will order CPAP for nighttime use

## 2024-01-27 NOTE — Progress Notes (Signed)
 Pt transitioned off ENDO tool. Long acting insulin  given and insulin  gtt and D5LR stopped two hours later. SSI insulin  dose given for evening meal. Pt requesting a different diet and addition of glucerna. He is not able to eat some foods due to missing teeth.

## 2024-01-27 NOTE — Assessment & Plan Note (Addendum)
 Appears EF was 25 to 30% in 2018 but this has recovered to normal EF on last echo 2022.  Patient appears euvolemic today.  He has an ICD and biventricular pacemaker in place. -Hold carvedilol  25 mg twice daily and Lasix  60 mg daily given lower BP for now -Rosuvastatin  20 mg daily -Eliquis  5 mg twice daily

## 2024-01-27 NOTE — Assessment & Plan Note (Addendum)
 Likely secondary to insulin  non-adherence. Improved as patient's glucose now < 200, electrolytes are normalizing, and AG normal x2. Will plan to transition patient to SQ insulin  today and discontinue Endo Tool when able to tolerate PO. Mental status is normalized and patient is able to tolerate p.o. intake. CT Head negative for acute abnormalities.  -Glucose checks every hour -Repeat BMP every 4 hours to monitor K -40 mEq oral KCL tablet once; hold insulin  as needed if K < 3.5 -Started D5LR infusion as glucose now < 250 -NPO until glucose resolves -IV fluids -Will plan to transition to SQ insulin 

## 2024-01-27 NOTE — Assessment & Plan Note (Signed)
 BP's soft on admission, likely in the setting of relative dehydration. Improving, currently normotensive. -Hold hydralazine  50 mg daily, Losartan -hydrochlorothiazide 50-12.5 mg daily for now

## 2024-01-27 NOTE — Plan of Care (Signed)
 Discussed transition plan off of Endotool with nursing.  Discussed discontinuing D5 fluids, initiating Carb Modified diet and ensuring he is tolerating it.  Will start with Semglee  35 units today and titrate as needed tomorrow.  Advised continuing Endotool for 2 hours while transitioning.  Kathrine Melena, DO 01/27/24 2:10 PM

## 2024-01-27 NOTE — Hospital Course (Addendum)
 Anthony [name] is a 73 y.o.male with a history of insulin -dependent T2DM who was admitted to the [facility] Medicine Teaching Service at [facility] for [facility]. His hospital course is detailed below:  Hyperosmolar hyperglycemic state Presenting with BG >900, mild hypotension, fatigue, hyperosmolality, normal pH in the setting of not taking insulin  due to chronic back pain. Patient was started on Endotool and IVF with improvement of BG. Patient transitioned to subQ insulin  with appropriate overlap with Endotool with control of blood glucose.  Discharged with *** regimen.   Hypokalemia Patient found to low K 2.7 after intiating Endotool. Patient received oral and IV potassium repletion. Endotool was paused and resumed as indicated with eventual transition to subQ insulin  per above.  Potassium was normal at discharge***.  Chronic back pain Main presenting symptom without red flags on exam.  CT of the lumbar spine with significant degenerative disease with spinal canal and foraminal narrowing that has worsened from prior.  He declined surgical intervention.  Pain overall controlled throughout admission.  He was recommended to follow-up with [facility] for repeat epidural steroid injections for his chronic pain.  Other chronic conditions were medically managed with home medications and formulary alternatives as necessary (CHF, Afib, HTN, CKD4, chronic back pain)  PCP Follow-up Recommendations: Ensure he is able to take his insulin  regimen at home Assess need for neurosurgical follow-up for severe lumbar stenosis, the patient currently declines surgical intervention Ensure he is scheduled with [facility] for repeat epidural steroid injection chronic back pain, he should have these every 3 months

## 2024-01-27 NOTE — Progress Notes (Signed)
 Daily Progress Note Intern Pager: [phone]  Patient name: JENS SIEMS Medical record number: [id number] Date of birth: [birth year] Age: 73 y.o. Gender: male  Primary Care Provider: Madelon Donald HERO, DO Consultants: None Code Status: Full  Pt Overview and Major Events to Date:  -Admitted: 9/2 -CT Head on 9/2 negative for acute abnormalities  Assessment and Plan:  Patient is a 73 year old male presenting with hypotension, hyperglycemia, fatigue found to have HHS. Pertinent PMH/PSH includes insulin -dependent type 2 diabetes mellitus. Assessment & Plan Type 2 diabetes mellitus with hyperosmolar hyperglycemic state (HHS) (HCC) Likely secondary to insulin  non-adherence. Improved as patient's glucose now < 200, electrolytes are normalizing, and AG normal x2. Will plan to transition patient to SQ insulin  today and discontinue Endo Tool when able to tolerate PO. Mental status is normalized and patient is able to tolerate p.o. intake. CT Head negative for acute abnormalities.  -Glucose checks every hour -Repeat BMP every 4 hours to monitor K -40 mEq oral KCL tablet once; hold insulin  as needed if K < 3.5 -Started D5LR infusion as glucose now < 250 -NPO until glucose resolves -IV fluids -Will plan to transition to SQ insulin  Chronic back pain Without red flag signs on exam, but appears to be worsening recently. Lumbar spine in 2015 with advanced generative disc disease of the lower lumbar spine L5-S1 where there is grade 1 spondylolisthesis and possible pars defects. He continues to follow with orthopedics. -Continue gabapentin  for pain  -Decrease oxy to 5 mg TID d/t causing excessive sedation -Add on lidocaine  patches daily --MRI lumbar spine HYPERTENSION, BENIGN SYSTEMIC BP's soft on admission, likely in the setting of relative dehydration. Improving, currently normotensive. -Hold hydralazine  50 mg daily, Losartan -hydrochlorothiazide 50-12.5 mg daily for now OSA on CPAP -Will  order CPAP for nighttime use Chronic kidney disease (CKD), stage IV (severe) (HCC) With superimposed AKI, likely due to relative dehydration. Cr down-trending. -Avoid nephrotoxic agents -Fluids as above - AM BMP Congestive heart failure, NYHA class 3, chronic, systolic (HCC) Atrial fibrillation, permanent (HCC) Appears EF was 25 to 30% in 2018 but this has recovered to normal EF on last echo 2022. Patient appears euvolemic today.  He has an ICD and biventricular pacemaker in place.  -Hold carvedilol  25 mg twice daily and Lasix  60 mg daily given lower BP for now -Rosuvastatin  20 mg daily -Eliquis  5 mg twice daily Chest pain at rest Reported single, brief episode of non-radiating, pressure-like sternal chest pain. He has hx of HF as noted above and elevated troponin on admission. -Repeat EKG -Repeat troponin   FEN/GI: transition to carb-modified diet PPx: SCD's Dispo:Home pending clinical improvement . Barriers include: patient requiring ongoing medical management.   Subjective:  Patient seen and evaluated at bedside. On assessment today, patient is alert and oriented to time, person, place, and situation. He appears very sleepy on interview, and requires frequent prompting to stay awake. When awake, he is able to answer questions appropriately. He reports having a brief episode of pressure-like chest pain, lasting a few seconds while resting in bed. Denies radiating pain, N/V, dyspnea, diaphoresis. Patient later seen in the early afternoon, appearing more alert and less drowsy. He is agreeable to proceeding with MRI brain and spine.  Objective: Temp:  [97.6 F (36.4 C)-98.1 F (36.7 C)] 97.7 F (36.5 C) (09/02 0843) Pulse Rate:  [68-73] 70 (09/02 0843) Resp:  [11-30] 18 (09/02 0843) BP: (78-127)/(56-85) 107/73 (09/02 0843) SpO2:  [96 %-100 %] 96 % (09/02 0843) FiO2 (%):  [  28 %] 28 % (09/01 2307) Weight:  [82.3 kg] 82.3 kg (09/01 1700)  Physical Exam: General: Alert, somnolent,  lying in bed Cardiovascular: Normal rate and rhythm; no MRG Respiratory: clear to auscultation bilaterally; normal respiratory effort Abdomen: Soft, non-distended, non-tender Extremities: Non-edematous  Laboratory: Most recent CBC Lab Results  Component Value Date   WBC 8.0 01/27/2024   HGB 12.3 (L) 01/27/2024   HCT 34.5 (L) 01/27/2024   MCV 84.8 01/27/2024   PLT 85 (L) 01/27/2024   Most recent BMP    Latest Ref Rng & Units 01/27/2024    8:40 AM  BMP  Glucose 70 - 99 mg/dL 796   BUN 8 - 23 mg/dL 34   Creatinine 9.38 - 1.24 mg/dL 7.38   Sodium 864 - 854 mmol/L 133   Potassium 3.5 - 5.1 mmol/L 3.4   Chloride 98 - 111 mmol/L 98   CO2 22 - 32 mmol/L 25   Calcium  8.9 - 10.3 mg/dL 8.8     Other pertinent labs:   Imaging/Diagnostic Tests:  CT Head w/o contrast Radiologist Impression: 1. No acute intracranial abnormality. 2. Old left frontal infarct and chronic ischemic white matter changes.  Mannie Ashley SAILOR, MD 01/27/2024, 11:17 AM  PGY-1, [facility] FPTS Intern pager: [phone], text pages welcome Secure chat group [facility] Teaching Service

## 2024-01-27 NOTE — Assessment & Plan Note (Addendum)
 With superimposed AKI, likely due to relative dehydration. Cr down-trending. -Avoid nephrotoxic agents -Fluids as above - AM BMP

## 2024-01-27 NOTE — TOC Initial Note (Signed)
 Transition of Care (TOC) - Initial/Assessment Note  Rayfield Gobble RN, BSN Inpatient Care Management Unit 4E- RN Case Manager See Treatment Team for direct phone #   Patient Details  Name: Anthony [name] MRN: [id number] Date of Birth: [birth year]  Transition of Care [facility]) CM/SW Contact:    Gobble Rayfield Hurst, RN Phone Number: 01/27/2024, 4:21 PM  Clinical Narrative:                 Referral noted for medication assistance.   CM spoke with pt at bedside. Discussed barriers to why he didn't take his meds. Per pt he ran out of his pain meds and was unable to get them when he finally saw the doctor due to not having the money. He voiced that he had insulin  at the house, but he was not taking it because he was trying to get his pain meds. When asked if he usually had trouble getting his meds- he voiced no. His main concern was asking how he was going to follow up on discharge to get his pain meds. He did state that he has a pain doctor that he sees for his pain meds and has a follow up to see them.   Pt voiced that he lives with his spouse, has other family that can transport home as spouse does not drive long distances.   Pt declined any discharge needs at this time, IP CM will follow.  Pt has a managed Medicare plan- would not be eligible for [facility] assistance with meds on discharge.   Expected Discharge Plan: Home/Self Care Barriers to Discharge: Continued Medical Work up   Patient Goals and CMS Choice Patient states their goals for this hospitalization and ongoing recovery are:: return home   Choice offered to / list presented to : NA      Expected Discharge Plan and Services   Discharge Planning Services: CM Consult, Medication Assistance Post Acute Care Choice: NA Living arrangements for the past 2 months: Single Family Home                   DME Agency: NA                  Prior Living Arrangements/Services Living arrangements for the past 2 months: Single  Family Home Lives with:: Spouse Patient language and need for interpreter reviewed:: Yes        Need for Family Participation in Patient Care: Yes (Comment) Care giver support system in place?: Yes (comment) Current home services: DME Criminal Activity/Legal Involvement Pertinent to Current Situation/Hospitalization: No - Comment as needed  Activities of Daily Living   ADL Screening (condition at time of admission) Independently performs ADLs?: Yes (appropriate for developmental age) Is the patient deaf or have difficulty hearing?: No Does the patient have difficulty seeing, even when wearing glasses/contacts?: Yes Does the patient have difficulty concentrating, remembering, or making decisions?: No  Permission Sought/Granted                  Emotional Assessment Appearance:: Appears stated age Attitude/Demeanor/Rapport: Engaged Affect (typically observed): Accepting Orientation: : Oriented to Self, Oriented to Place, Oriented to  Time, Oriented to Situation Alcohol / Substance Use: Not Applicable Psych Involvement: No (comment)  Admission diagnosis:  Hyperosmolar hyperglycemic state (HHS) (HCC) [E11.00] Patient Active Problem List   Diagnosis Date Noted   Chest pain at rest 01/27/2024   Type 2 diabetes mellitus with hyperosmolar hyperglycemic state (HHS) (HCC) 01/26/2024   Chronic back  pain 01/26/2024   Peripheral neuropathy 10/11/2022   Urinary urgency 06/18/2022   CVA (cerebral vascular accident) (HCC) 02/20/2022   Low back pain with right-sided sciatica 01/07/2022   BPH (benign prostatic hyperplasia) 08/12/2021   Hip weakness 02/27/2021   Left leg weakness 02/17/2021   Neck mass 11/16/2020   Erectile dysfunction 05/31/2020   Polypharmacy 05/22/2020   Porokeratosis 04/26/2020   Other biomechanical lesions of lumbar region 12/07/2019   Neck muscle strain 11/22/2019   Hx of adenomatous colonic polyps    S/P ICD (internal cardiac defibrillator) Medtronic  07/26/2017   NICM (nonischemic cardiomyopathy) (HCC) 07/25/2017   Congestive heart failure, NYHA class 3, chronic, systolic (HCC) 07/25/2017   Atrial fibrillation, permanent (HCC) 07/25/2017   Memory change    Thrombocytopenia (HCC)    Right temporal lobe infarction (HCC) 06/13/2017   Seizures (HCC) 06/12/2017   Chronic kidney disease (CKD), stage IV (severe) (HCC) 01/22/2017   Secondary hyperparathyroidism of renal origin (HCC) 01/22/2017   Chronic anticoagulation 03/06/2016   Chronic, continuous use of opioids 10/18/2014   OSA on CPAP 04/04/2014   History of tobacco abuse 06/28/2013   DIABETIC  RETINOPATHY 02/24/2008   Insulin  dependent type 2 diabetes mellitus (HCC) 07/24/2006   HYPERCHOLESTEROLEMIA 07/24/2006   Depression 07/24/2006   HYPERTENSION, BENIGN SYSTEMIC 07/24/2006   Coronary atherosclerosis 07/24/2006   GASTROESOPHAGEAL REFLUX, NO ESOPHAGITIS 07/24/2006   PCP:  Madelon Donald HERO, DO Pharmacy:   [facility] [phone] - [place],  - [address] AT [facility] [address] [place] [address] Phone: [phone] Fax: [phone]  [facility] [id number] - [place], [place] - [address] [facility] [address] [place] [address] Phone: [phone] Fax: [phone]     Social Drivers of Health (SDOH) Social History: SDOH Screenings   Food Insecurity: No Food Insecurity (01/26/2024)  Housing: Low Risk  (01/26/2024)  Transportation Needs: No Transportation Needs (01/26/2024)  Utilities: Not At Risk (01/26/2024)  Alcohol Screen: Low Risk  (08/21/2023)  Depression (PHQ2-9): Low Risk  (10/29/2023)  Financial Resource Strain: Low Risk  (08/21/2023)  Physical Activity: Sufficiently Active (08/21/2023)  Social Connections: Socially Integrated (01/26/2024)  Stress: No Stress Concern Present (08/21/2023)  Tobacco Use: Medium Risk (01/26/2024)  Health Literacy: Adequate Health Literacy (08/21/2023)   SDOH Interventions:      Readmission Risk Interventions     No data to display

## 2024-01-28 ENCOUNTER — Other Ambulatory Visit (HOSPITAL_COMMUNITY): Payer: Self-pay

## 2024-01-28 ENCOUNTER — Telehealth (HOSPITAL_COMMUNITY): Payer: Self-pay | Admitting: Pharmacy Technician

## 2024-01-28 DIAGNOSIS — I4821 Permanent atrial fibrillation: Secondary | ICD-10-CM | POA: Diagnosis not present

## 2024-01-28 DIAGNOSIS — M5440 Lumbago with sciatica, unspecified side: Secondary | ICD-10-CM | POA: Diagnosis not present

## 2024-01-28 DIAGNOSIS — E1100 Type 2 diabetes mellitus with hyperosmolarity without nonketotic hyperglycemic-hyperosmolar coma (NKHHC): Secondary | ICD-10-CM | POA: Diagnosis not present

## 2024-01-28 DIAGNOSIS — N184 Chronic kidney disease, stage 4 (severe): Secondary | ICD-10-CM | POA: Diagnosis not present

## 2024-01-28 LAB — BASIC METABOLIC PANEL WITH GFR
Anion gap: 12 (ref 5–15)
BUN: 33 mg/dL — ABNORMAL HIGH (ref 8–23)
CO2: 24 mmol/L (ref 22–32)
Calcium: 8.9 mg/dL (ref 8.9–10.3)
Chloride: 98 mmol/L (ref 98–111)
Creatinine, Ser: 2.51 mg/dL — ABNORMAL HIGH (ref 0.61–1.24)
GFR, Estimated: 27 mL/min — ABNORMAL LOW (ref >60)
Glucose, Bld: 251 mg/dL — ABNORMAL HIGH (ref 70–99)
Potassium: 4.4 mmol/L (ref 3.5–5.1)
Sodium: 134 mmol/L — ABNORMAL LOW (ref 135–145)

## 2024-01-28 LAB — CBC
HCT: 34.3 % — ABNORMAL LOW (ref 39.0–52.0)
Hemoglobin: 11.7 g/dL — ABNORMAL LOW (ref 13.0–17.0)
MCH: 30.1 pg (ref 26.0–34.0)
MCHC: 34.1 g/dL (ref 30.0–36.0)
MCV: 88.2 fL (ref 80.0–100.0)
Platelets: 88 K/uL — ABNORMAL LOW (ref 150–400)
RBC: 3.89 MIL/uL — ABNORMAL LOW (ref 4.22–5.81)
RDW: 14.3 % (ref 11.5–15.5)
WBC: 6.9 K/uL (ref 4.0–10.5)
nRBC: 0.3 % — ABNORMAL HIGH (ref 0.0–0.2)

## 2024-01-28 LAB — GLUCOSE, CAPILLARY
Glucose-Capillary: 229 mg/dL — ABNORMAL HIGH (ref 70–99)
Glucose-Capillary: 169 mg/dL — ABNORMAL HIGH (ref 70–99)
Glucose-Capillary: 206 mg/dL — ABNORMAL HIGH (ref 70–99)
Glucose-Capillary: 72 mg/dL (ref 70–99)

## 2024-01-28 LAB — HEPATIC FUNCTION PANEL
ALT: 15 U/L (ref 0–44)
AST: 27 U/L (ref 15–41)
Albumin: 2.3 g/dL — ABNORMAL LOW (ref 3.5–5.0)
Alkaline Phosphatase: 91 U/L (ref 38–126)
Bilirubin, Direct: 0.2 mg/dL (ref 0.0–0.2)
Indirect Bilirubin: 0.6 mg/dL (ref 0.3–0.9)
Total Bilirubin: 0.8 mg/dL (ref 0.0–1.2)
Total Protein: 5.3 g/dL — ABNORMAL LOW (ref 6.5–8.1)

## 2024-01-28 MED ORDER — ACETAMINOPHEN 500 MG PO TABS
1000.0000 mg | ORAL_TABLET | Freq: Two times a day (BID) | ORAL | Status: DC
  Administered 2024-01-28 – 2024-01-31 (×7): 1000 mg via ORAL
  Filled 2024-01-28 (×7): qty 2

## 2024-01-28 MED ORDER — ENOXAPARIN SODIUM 30 MG/0.3ML IJ SOSY
30.0000 mg | PREFILLED_SYRINGE | INTRAMUSCULAR | Status: DC
  Administered 2024-01-28 – 2024-01-30 (×3): 30 mg via SUBCUTANEOUS
  Filled 2024-01-28 (×3): qty 0.3

## 2024-01-28 MED ORDER — CARVEDILOL 3.125 MG PO TABS
3.1250 mg | ORAL_TABLET | Freq: Two times a day (BID) | ORAL | Status: DC
  Filled 2024-01-28: qty 1

## 2024-01-28 MED ORDER — METOPROLOL TARTRATE 12.5 MG HALF TABLET
12.5000 mg | ORAL_TABLET | Freq: Two times a day (BID) | ORAL | Status: DC
  Administered 2024-01-28 – 2024-01-31 (×6): 12.5 mg via ORAL
  Filled 2024-01-28 (×6): qty 1

## 2024-01-28 MED ORDER — INSULIN GLARGINE 100 UNIT/ML ~~LOC~~ SOLN
40.0000 [IU] | Freq: Every day | SUBCUTANEOUS | Status: DC
  Administered 2024-01-28: 40 [IU] via SUBCUTANEOUS
  Filled 2024-01-28 (×2): qty 0.4

## 2024-01-28 NOTE — Telephone Encounter (Signed)

## 2024-01-28 NOTE — Assessment & Plan Note (Addendum)
 Appears EF was 25 to 30% in 2018 but this has recovered to normal EF on last echo 2022. Patient appears euvolemic today. He has an ICD and biventricular pacemaker in place.  --Restart home carvedilol  at reduced dose of 3.125 mg BID --Rosuvastatin  20 mg daily --Hold home Eliquis  5 mg twice daily given low platelet count --Hold Lasix  60 mg daily given lower BP for now

## 2024-01-28 NOTE — Assessment & Plan Note (Addendum)
 Likely secondary to insulin  non-adherence. Patient transitioned to subcutaneous insulin  following improvement in glucose, electrolytes, and normal AG.  --Glargine 40 units daily + Aspart TID before meals --Glucose checks q4h before meals and at bedtime --Carb-modified soft diet --Telemetry monitoring

## 2024-01-28 NOTE — Assessment & Plan Note (Signed)
 Reported single, brief episode of non-radiating, pressure-like sternal chest pain. He has hx of HF as noted above and elevated troponin on admission. Repeat EKG and troponin on 9/3 not concerning for ACS. -Continue to monitor

## 2024-01-28 NOTE — Assessment & Plan Note (Signed)
-  Will order CPAP for nighttime use

## 2024-01-28 NOTE — Progress Notes (Signed)
 Daily Progress Note Intern Pager: [phone]  Patient name: Anthony [name] Medical record number: [id number] Date of birth: [birth year] Age: 73 y.o. Gender: male  Primary Care Provider: Madelon Donald HERO, DO Consultants: None Code Status: Full  Pt Overview and Major Events to Date:  -Admitted: 9/2 -CT Head on 9/2 negative for acute abnormalities  Assessment and Plan:  Patient is a 73 year old male presenting with hypotension, hyperglycemia, fatigue found to have HHS. Pertinent PMH/PSH includes insulin -dependent type 2 diabetes mellitus. Assessment & Plan Type 2 diabetes mellitus with hyperosmolar hyperglycemic state (HHS) (HCC) Likely secondary to insulin  non-adherence. Patient transitioned to subcutaneous insulin  following improvement in glucose, electrolytes, and normal AG.  --Glargine 40 units daily + Aspart TID before meals --Glucose checks q4h before meals and at bedtime --Carb-modified soft diet --Telemetry monitoring Chronic back pain Without red flag signs on exam, but appears to be worsening recently. Lumbar spine in 2015 with advanced generative disc disease of the lower lumbar spine L5-S1 where there is grade 1 spondylolisthesis and possible pars defects. CT Lumbar spine on 01/27/24 with progression to grade 2 spondylolisthesis at L4-L5 and moderate bilateral foraminal stenosis at L3-L4. Patient follows with orthopedics. --Continue gabapentin  for pain  --Oxycodone  5 mg TID (lowered from home dose d/t excessive sedation) --Tylenol  1000 mg BID --Add on lidocaine  patches daily HYPERTENSION, BENIGN SYSTEMIC BP's soft on admission, likely in the setting of relative dehydration. -Hold home hydralazine  50 mg daily, Losartan -hydrochlorothiazide 50-12.5 mg daily for now OSA on CPAP -Will order CPAP for nighttime use Chronic kidney disease (CKD), stage IV (severe) (HCC) With superimposed AKI, likely due to relative dehydration. Cr elevated, stable at 2.51 today. --Avoid  nephrotoxic agents -AM BMP Congestive heart failure, NYHA class 3, chronic, systolic (HCC) Atrial fibrillation, permanent (HCC) Appears EF was 25 to 30% in 2018 but this has recovered to normal EF on last echo 2022. Patient appears euvolemic today. He has an ICD and biventricular pacemaker in place.  --Restart home carvedilol  at reduced dose of 3.125 mg BID --Rosuvastatin  20 mg daily --Hold home Eliquis  5 mg twice daily given low platelet count --Hold Lasix  60 mg daily given lower BP for now Chest pain at rest Reported single, brief episode of non-radiating, pressure-like sternal chest pain. He has hx of HF as noted above and elevated troponin on admission. Repeat EKG and troponin on 9/3 not concerning for ACS. -Continue to monitor   FEN/GI: Carb-modified soft diet PPx: Lovenox  Dispo:Home pending clinical improvement . Barriers include: patient requiring ongoing medical management.   Subjective:  Patient seen and evaluated at bedside. On assessment today, patient is alert and oriented to time, person, place, and situation. Denies any chest pain or dyspnea. He does note some dull, right hip pain. Updated patient on findings from CT spine. Discussed with patient we will increase his basal insulin  since his glucose is in the 200's.  Objective: Temp:  [97.5 F (36.4 C)-98.1 F (36.7 C)] 97.9 F (36.6 C) (09/03 0606) Pulse Rate:  [70-72] 70 (09/02 2156) Resp:  [16-20] 20 (09/02 2156) BP: (102-137)/(66-101) 102/66 (09/03 0356) SpO2:  [94 %-100 %] 100 % (09/02 2156)  Physical Exam: General: Alert, lying in bed, no acute distress Cardiovascular: Normal rate and rhythm; no M/R/G Respiratory: clear to auscultation bilaterally; normal respiratory effort Abdomen: Soft, non-distended, non-tender Extremities: Non-edematous  Laboratory: Most recent CBC Lab Results  Component Value Date   WBC 8.0 01/27/2024   HGB 12.3 (L) 01/27/2024   HCT 34.5 (L) 01/27/2024  MCV 84.8 01/27/2024   PLT  85 (L) 01/27/2024   Most recent BMP    Latest Ref Rng & Units 01/28/2024    3:09 AM  BMP  Glucose 70 - 99 mg/dL 748   BUN 8 - 23 mg/dL 33   Creatinine 9.38 - 1.24 mg/dL 7.48   Sodium 864 - 854 mmol/L 134   Potassium 3.5 - 5.1 mmol/L 4.4   Chloride 98 - 111 mmol/L 98   CO2 22 - 32 mmol/L 24   Calcium  8.9 - 10.3 mg/dL 8.9     Other pertinent labs: N/A  Imaging/Diagnostic Tests:  CT Head w/o contrast (01/26/24) Radiologist Impression: 1. No acute intracranial abnormality. 2. Old left frontal infarct and chronic ischemic white matter changes.  CT Lumbar Spine w/ contrast (01/27/24) Radiologist Impression:  1. Grade 2 anterolisthesis at L4-L5 due to bilateral pars interarticularis defects, with severe bilateral neural foraminal stenosis at this level. 2. Transitional lumbosacral anatomy with partial sacralization of L5 and bilateral assimilation joints. 3. Moderate bilateral foraminal stenosis at L3-L4.   Mannie Ashley SAILOR, MD 01/28/2024, 7:39 AM  PGY-1, [facility] Family Medicine FPTS Intern pager: [phone], text pages welcome Secure chat group [facility] Teaching Service

## 2024-01-28 NOTE — Progress Notes (Signed)
 Mobility Specialist Progress Note:    01/28/24 1128  Mobility  Activity Ambulated with assistance;Pivoted/transferred from bed to chair  Level of Assistance Minimal assist, patient does 75% or more  Assistive Device Front wheel walker  Distance Ambulated (ft) 5 ft  Activity Response Tolerated well  Mobility Referral Yes  Mobility visit 1 Mobility  Mobility Specialist Start Time (ACUTE ONLY) 1113  Mobility Specialist Stop Time (ACUTE ONLY) 1121  Mobility Specialist Time Calculation (min) (ACUTE ONLY) 8 min   Pt received dangling EOB, Agreeable to transfer B>C. Required MinA to stand and ambulate d/t posterior lean. Tolerated well, VSS throughout. Left in chair with all needs met, table and call bell in reach.   Omni Dunsworth Mobility Specialist Please contact via Special educational needs teacher or  Rehab office at [phone]

## 2024-01-28 NOTE — Assessment & Plan Note (Addendum)
 Without red flag signs on exam, but appears to be worsening recently. Lumbar spine in 2015 with advanced generative disc disease of the lower lumbar spine L5-S1 where there is grade 1 spondylolisthesis and possible pars defects. CT Lumbar spine on 01/27/24 with progression to grade 2 spondylolisthesis at L4-L5 and moderate bilateral foraminal stenosis at L3-L4. Patient follows with orthopedics. --Continue gabapentin  for pain  --Oxycodone  5 mg TID (lowered from home dose d/t excessive sedation) --Tylenol  1000 mg BID --Add on lidocaine  patches daily

## 2024-01-28 NOTE — Assessment & Plan Note (Addendum)
 BP's soft on admission, likely in the setting of relative dehydration. -Hold home hydralazine  50 mg daily, Losartan -hydrochlorothiazide 50-12.5 mg daily for now

## 2024-01-28 NOTE — Evaluation (Addendum)
 Physical Therapy Evaluation Patient Details Name: Anthony [name] MRN: [id number] DOB: 05-Aug-1950 Today's Date: 01/28/2024  History of Present Illness  73 y.o. male presents to [facility] hospital on 01/26/2024 with hypotension, hyperglycemia and fatigue. PMH includes DMII, HTN, OSA, CHF, afib, depression, seizure, ICD placement, CVA and chronic back pain.  Clinical Impression  Pt presents to PT with deficits in functional mobility, gait, balance, strength, power, endurance, cognition. No caregivers are present during session to provide a comparison to baseline however the pt demonstrates increased processing time and reduced awareness of current deficits. Pt demonstrates a posterior lean with initial transfer attempts, demonstrating improvement with use of RW later during the session. Due to imbalance and lack of physical assistance available for caregivers PT recommends short term inpatient PT services at the time of discharge. Patient will benefit from continued inpatient follow up therapy, <3 hours/day.    If plan is discharge home, recommend the following: A little help with walking and/or transfers;A lot of help with bathing/dressing/bathroom;Assistance with cooking/housework;Direct supervision/assist for medications management;Direct supervision/assist for financial management;Assist for transportation;Help with stairs or ramp for entrance;Supervision due to cognitive status   Can travel by private vehicle        Equipment Recommendations Rolling walker (2 wheels);BSC/3in1  Recommendations for Other Services       Functional Status Assessment Patient has had a recent decline in their functional status and demonstrates the ability to make significant improvements in function in a reasonable and predictable amount of time.     Precautions / Restrictions Precautions Precautions: Fall Recall of Precautions/Restrictions: Impaired Restrictions Weight Bearing Restrictions Per Provider Order: No       Mobility  Bed Mobility Overal bed mobility: Needs Assistance Bed Mobility: Supine to Sit, Sit to Supine     Supine to sit: Contact guard, HOB elevated, Used rails Sit to supine: Supervision        Transfers Overall transfer level: Needs assistance Equipment used: Rolling walker (2 wheels) Transfers: Sit to/from Stand Sit to Stand: Min assist, Mod assist           General transfer comment: minA initially with SPC due to posterior lean, CGA for 2 more transfers with RW, no posterior lean demonstrated on final transfer    Ambulation/Gait Ambulation/Gait assistance: Contact guard assist Gait Distance (Feet): 25 Feet (25' x 2 trials) Assistive device: Rolling walker (2 wheels) Gait Pattern/deviations: Step-to pattern, Knee flexed in stance - right, Knee flexed in stance - left Gait velocity: reduced Gait velocity interpretation: <1.31 ft/sec, indicative of household ambulator   General Gait Details: pt with slowed step-to gait  Stairs            Wheelchair Mobility     Tilt Bed    Modified Rankin (Stroke Patients Only)       Balance Overall balance assessment: Needs assistance Sitting-balance support: No upper extremity supported, Feet supported Sitting balance-Leahy Scale: Poor Sitting balance - Comments: initially with posterior losses of balance, improves to supervision level by end of session Postural control: Posterior lean Standing balance support: Bilateral upper extremity supported, Reliant on assistive device for balance Standing balance-Leahy Scale: Poor                               Pertinent Vitals/Pain Pain Assessment Pain Assessment: Faces Faces Pain Scale: Hurts little more Pain Location: back Pain Descriptors / Indicators: Aching Pain Intervention(s): Monitored during session    Home Living Family/patient expects  to be discharged to:: Private residence Living Arrangements: Spouse/significant other Available Help at  Discharge: Family;Available 24 hours/day Type of Home: House Home Access: Stairs to enter Entrance Stairs-Rails: Right;Left;Can reach both Entrance Stairs-Number of Steps: 5   Home Layout: One level Home Equipment: Cane - single point      Prior Function Prior Level of Function : Independent/Modified Independent             Mobility Comments: pt reports ambulating independently with use of SPC, does not appear to mobilize for community distances       Extremity/Trunk Assessment   Upper Extremity Assessment Upper Extremity Assessment: Generalized weakness    Lower Extremity Assessment Lower Extremity Assessment: Generalized weakness    Cervical / Trunk Assessment Cervical / Trunk Assessment: Kyphotic  Communication   Communication Communication: Impaired Factors Affecting Communication: Reduced clarity of speech    Cognition Arousal: Alert Behavior During Therapy: Flat affect   PT - Cognitive impairments: No family/caregiver present to determine baseline, Memory, Awareness, Safety/Judgement                         Following commands: Impaired Following commands impaired: Follows one step commands with increased time     Cueing Cueing Techniques: Verbal cues, Visual cues     General Comments General comments (skin integrity, edema, etc.): VSS on RA    Exercises     Assessment/Plan    PT Assessment Patient needs continued PT services  PT Problem List Decreased strength;Decreased activity tolerance;Decreased balance;Decreased mobility;Decreased knowledge of use of DME;Decreased knowledge of precautions;Pain       PT Treatment Interventions DME instruction;Gait training;Stair training;Functional mobility training;Therapeutic activities;Balance training;Therapeutic exercise;Neuromuscular re-education;Patient/family education;Cognitive remediation    PT Goals (Current goals can be found in the Care Plan section)  Acute Rehab PT Goals Patient  Stated Goal: to return home PT Goal Formulation: With patient Time For Goal Achievement: 02/11/24 Potential to Achieve Goals: Fair    Frequency Min 3X/week     Co-evaluation               AM-PAC PT 6 Clicks Mobility  Outcome Measure Help needed turning from your back to your side while in a flat bed without using bedrails?: A Little Help needed moving from lying on your back to sitting on the side of a flat bed without using bedrails?: A Little Help needed moving to and from a bed to a chair (including a wheelchair)?: A Little Help needed standing up from a chair using your arms (e.g., wheelchair or bedside chair)?: A Little Help needed to walk in hospital room?: A Little Help needed climbing 3-5 steps with a railing? : A Lot 6 Click Score: 17    End of Session Equipment Utilized During Treatment: Gait belt Activity Tolerance: Patient tolerated treatment well Patient left: in bed;with call bell/phone within reach;with bed alarm set Nurse Communication: Mobility status PT Visit Diagnosis: Other abnormalities of gait and mobility (R26.89);Muscle weakness (generalized) (M62.81)    Time: 1354-1430 PT Time Calculation (min) (ACUTE ONLY): 36 min   Charges:   PT Evaluation $PT Eval Low Complexity: 1 Low   PT General Charges $$ ACUTE PT VISIT: 1 Visit         Bernardino JINNY Ruth, PT, DPT Acute Rehabilitation Office [phone]   Bernardino JINNY Ruth 01/28/2024, 2:45 PM

## 2024-01-28 NOTE — Discharge Instructions (Addendum)
 Dear Anthony [name],  Thank you for letting us  participate in your care. You were hospitalized for high blood sugar and diagnosed with Type 2 diabetes mellitus with hyperosmolar hyperglycemic state (HHS) (HCC). You were treated with insulin , fluids, and pain medicines.   POST-HOSPITAL & CARE INSTRUCTIONS Be sure to take all medications as listed in this packet. Go to your follow up appointments (listed below)  DOCTOR'S APPOINTMENT   Future Appointments  Date Time Provider Department Center  01/30/2024 10:50 AM Madelon Donald HERO, DO [facility]  03/01/2024  7:00 AM [facility] HVT DEVICE REMOTES [facility]  08/23/2024  3:00 PM [facility] ANNUAL WELLNESS VISIT [facility]    Follow-up Information     Madelon Donald HERO, DO. Go on 01/30/2024.   Specialty: Family Medicine Why: For hospital follow-up at 10:50 AM Contact information: [address] [place] [address] [phone]                 Take care and be well!  Family Medicine Teaching Service Inpatient Team [place]  [facility]  [address] [place], [place] [address] [phone]

## 2024-01-28 NOTE — Inpatient Diabetes Management (Signed)
 Inpatient Diabetes Program Recommendations  AACE/ADA: New Consensus Statement on Inpatient Glycemic Control (2015)  Target Ranges:  Prepandial:   less than 140 mg/dL      Peak postprandial:   less than 180 mg/dL (1-2 hours)      Critically ill patients:  140 - 180 mg/dL   Lab Results  Component Value Date   GLUCAP 229 (H) 01/28/2024   HGBA1C 9.2 (A) 09/01/2023    Latest Reference Range & Units 01/27/24 12:39 01/27/24 14:29 01/27/24 16:44 01/27/24 17:34 01/27/24 21:56 01/28/24 06:08  Glucose-Capillary 70 - 99 mg/dL 840 (H) 851 (H) 867 (H) 168 (H) 230 (H) 229 (H)  (H): Data is abnormally high  Diabetes history: DM2 Outpatient Diabetes medications:  Tresiba  32 units daily Novolog  8-10 units B & L  15-20 units D  Current orders for Inpatient glycemic control: Lantus  40 units daily Novolog  0-9 units tid  Inpatient Diabetes Program Recommendations:   Please consider: -Add Novolog  4 units tid meal coverage if eats 50% meal -Add Novolog  0-5 units hs correction  Thank you, Dagoberto E. Aveline Daus, RN, MSN, CNS, CDCES  Diabetes Coordinator Inpatient Glycemic Control Team Team Pager [phone] (8am-5pm) 01/28/2024 10:56 AM

## 2024-01-28 NOTE — Assessment & Plan Note (Addendum)
 With superimposed AKI, likely due to relative dehydration. Cr elevated, stable at 2.51 today. --Avoid nephrotoxic agents -AM BMP

## 2024-01-29 DIAGNOSIS — E1100 Type 2 diabetes mellitus with hyperosmolarity without nonketotic hyperglycemic-hyperosmolar coma (NKHHC): Secondary | ICD-10-CM | POA: Diagnosis not present

## 2024-01-29 DIAGNOSIS — R4182 Altered mental status, unspecified: Secondary | ICD-10-CM | POA: Insufficient documentation

## 2024-01-29 LAB — URINALYSIS, ROUTINE W REFLEX MICROSCOPIC
Bacteria, UA: NONE SEEN
Bilirubin Urine: NEGATIVE
Glucose, UA: 50 mg/dL — AB
Hgb urine dipstick: NEGATIVE
Ketones, ur: NEGATIVE mg/dL
Nitrite: NEGATIVE
Protein, ur: 30 mg/dL — AB
Specific Gravity, Urine: 1.015 (ref 1.005–1.030)
pH: 5 (ref 5.0–8.0)

## 2024-01-29 LAB — GLUCOSE, CAPILLARY
Glucose-Capillary: 86 mg/dL (ref 70–99)
Glucose-Capillary: 199 mg/dL — ABNORMAL HIGH (ref 70–99)
Glucose-Capillary: 180 mg/dL — ABNORMAL HIGH (ref 70–99)
Glucose-Capillary: 82 mg/dL (ref 70–99)

## 2024-01-29 LAB — BASIC METABOLIC PANEL WITH GFR
Anion gap: 13 (ref 5–15)
BUN: 35 mg/dL — ABNORMAL HIGH (ref 8–23)
CO2: 23 mmol/L (ref 22–32)
Calcium: 8.8 mg/dL — ABNORMAL LOW (ref 8.9–10.3)
Chloride: 98 mmol/L (ref 98–111)
Creatinine, Ser: 2.8 mg/dL — ABNORMAL HIGH (ref 0.61–1.24)
GFR, Estimated: 23 mL/min — ABNORMAL LOW (ref >60)
Glucose, Bld: 112 mg/dL — ABNORMAL HIGH (ref 70–99)
Potassium: 4.7 mmol/L (ref 3.5–5.1)
Sodium: 134 mmol/L — ABNORMAL LOW (ref 135–145)

## 2024-01-29 LAB — CBC
HCT: 34.2 % — ABNORMAL LOW (ref 39.0–52.0)
Hemoglobin: 11.5 g/dL — ABNORMAL LOW (ref 13.0–17.0)
MCH: 30 pg (ref 26.0–34.0)
MCHC: 33.6 g/dL (ref 30.0–36.0)
MCV: 89.3 fL (ref 80.0–100.0)
Platelets: 82 K/uL — ABNORMAL LOW (ref 150–400)
RBC: 3.83 MIL/uL — ABNORMAL LOW (ref 4.22–5.81)
RDW: 14.6 % (ref 11.5–15.5)
WBC: 6.9 K/uL (ref 4.0–10.5)
nRBC: 0 % (ref 0.0–0.2)

## 2024-01-29 MED ORDER — INSULIN GLARGINE 100 UNIT/ML ~~LOC~~ SOLN
35.0000 [IU] | Freq: Every day | SUBCUTANEOUS | Status: DC
  Administered 2024-01-29: 35 [IU] via SUBCUTANEOUS
  Filled 2024-01-29 (×2): qty 0.35

## 2024-01-29 NOTE — Assessment & Plan Note (Signed)
 Likely secondary to insulin  non-adherence. Patient transitioned to subcutaneous insulin  following improvement in glucose, electrolytes, and normal AG.  --Glargine 40 units daily + Aspart TID before meals --Glucose checks q4h before meals and at bedtime --Carb-modified soft diet --Telemetry monitoring

## 2024-01-29 NOTE — Assessment & Plan Note (Signed)
 BP's soft on admission, likely in the setting of relative dehydration. -Hold home hydralazine  50 mg daily, Losartan -hydrochlorothiazide 50-12.5 mg daily for now

## 2024-01-29 NOTE — Assessment & Plan Note (Signed)
-  Will order CPAP for nighttime use

## 2024-01-29 NOTE — Evaluation (Signed)
 Occupational Therapy Evaluation Patient Details Name: Anthony [name] MRN: [id number] DOB: 16-Mar-1951 Today's Date: 01/29/2024   History of Present Illness   73 y.o. male presents to [facility] hospital on 01/26/2024 with hypotension, hyperglycemia and fatigue. PMH includes DMII, HTN, OSA, CHF, afib, depression, seizure, ICD placement, CVA and chronic back pain.     Clinical Impressions Pt admitted based on above, and was seen based on problem list below. Pt reporting mod I with use of SPC PTA, but no family present to verify any PLOF information. Today pt is requiring set up  to mod assist for ADLs. Bed mobility was S for safety and functional transfers are  mod assist with RW. Pt with heavy posterior lean in standing requiring mod assist and cueing to maintain upright positions.Pt would benefit from <3 hours of skilled rehab daily prior to d/c home given the amount of assistance pt is requiring. OT will continue to follow acutely to maximize functional independence.        If plan is discharge home, recommend the following:   A lot of help with walking and/or transfers;A lot of help with bathing/dressing/bathroom;Supervision due to cognitive status     Functional Status Assessment   Patient has had a recent decline in their functional status and demonstrates the ability to make significant improvements in function in a reasonable and predictable amount of time.     Equipment Recommendations   Other (comment) (Defer to next venue)      Precautions/Restrictions   Precautions Precautions: Fall Recall of Precautions/Restrictions: Impaired Restrictions Weight Bearing Restrictions Per Provider Order: No     Mobility Bed Mobility Overal bed mobility: Needs Assistance Bed Mobility: Supine to Sit, Sit to Supine     Supine to sit: Supervision Sit to supine: Supervision   General bed mobility comments: S for safety, HOB elevated    Transfers Overall transfer level: Needs  assistance Equipment used: Rolling walker (2 wheels) Transfers: Sit to/from Stand Sit to Stand: Mod assist           General transfer comment: Mod assist to stand with RW. Once in standing posterior lean, mod assist to take lateral side steps along EOB      Balance Overall balance assessment: Needs assistance Sitting-balance support: No upper extremity supported, Feet supported Sitting balance-Leahy Scale: Poor Sitting balance - Comments: CGA Postural control: Posterior lean Standing balance support: Bilateral upper extremity supported, Reliant on assistive device for balance Standing balance-Leahy Scale: Poor Standing balance comment: Posterior lean and mod assist to maintain standing         ADL either performed or assessed with clinical judgement   ADL Overall ADL's : Needs assistance/impaired Eating/Feeding: Set up;Sitting   Grooming: Set up;Sitting   Upper Body Bathing: Set up;Sitting   Lower Body Bathing: Moderate assistance;Sit to/from stand Lower Body Bathing Details (indicate cue type and reason): Mod assist to STS and steady in standing Upper Body Dressing : Set up;Sitting   Lower Body Dressing: Moderate assistance;Sit to/from stand Lower Body Dressing Details (indicate cue type and reason): Pt able to reach BLEs for socks, mod assist to stand and balance in standing Toilet Transfer: Moderate assistance;Rolling walker (2 wheels) Toilet Transfer Details (indicate cue type and reason): Mod assist to stand and manage posterior lean.         Functional mobility during ADLs: Moderate assistance;Rolling walker (2 wheels) General ADL Comments: Decreased cog impairing problem solving, heavy posterior lean in standing limiting pt     Vision  Vision Assessment?: No apparent visual deficits            Pertinent Vitals/Pain Pain Assessment Pain Assessment: No/denies pain Pain Intervention(s): Monitored during session     Extremity/Trunk Assessment Upper  Extremity Assessment Upper Extremity Assessment: Generalized weakness;LUE deficits/detail LUE Deficits / Details: pt reports hx of poor AROM, shoulder flex ~70 degreees. bil ataxic jerks observed LUE Sensation: decreased light touch LUE Coordination: decreased gross motor;decreased fine motor   Lower Extremity Assessment Lower Extremity Assessment: Defer to PT evaluation   Cervical / Trunk Assessment Cervical / Trunk Assessment: Kyphotic   Communication Communication Communication: Impaired Factors Affecting Communication: Reduced clarity of speech;Difficulty expressing self   Cognition Arousal: Alert Behavior During Therapy: Flat affect Cognition: Cognition impaired   Orientation impairments: Place, Time, Situation Awareness: Intellectual awareness impaired, Online awareness impaired Memory impairment (select all impairments): Short-term memory, Working memory Attention impairment (select first level of impairment): Sustained attention Executive functioning impairment (select all impairments): Organization, Sequencing, Reasoning, Problem solving OT - Cognition Comments: Pt only oriented to self, asking how many days he has been admitted, appears aphasic difficulty expressing himself and with word retireval; delayed processing       Following commands: Impaired Following commands impaired: Follows one step commands with increased time     Cueing  General Comments   Cueing Techniques: Verbal cues;Visual cues  VSS on RA           Home Living Family/patient expects to be discharged to:: Private residence Living Arrangements: Spouse/significant other Available Help at Discharge: Family;Available 24 hours/day Type of Home: House Home Access: Stairs to enter [employer] of Steps: 5 Entrance Stairs-Rails: Right;Left;Can reach both Home Layout: One level     Bathroom Shower/Tub: Producer, television/film/video: Standard Bathroom Accessibility: Yes How  Accessible: Accessible via walker Home Equipment: Cane - single point   Additional Comments: home set up from prior admission, no family present to verify      Prior Functioning/Environment Prior Level of Function : Independent/Modified Independent             Mobility Comments: pt reports primarily using SPC for mobility ADLs Comments: pt reports mod I    OT Problem List: Decreased strength;Decreased range of motion;Decreased activity tolerance;Impaired balance (sitting and/or standing);Decreased safety awareness;Decreased cognition;Decreased knowledge of use of DME or AE;Cardiopulmonary status limiting activity   OT Treatment/Interventions: Self-care/ADL training;Therapeutic exercise;Energy conservation;DME and/or AE instruction;Therapeutic activities;Patient/family education;Balance training      OT Goals(Current goals can be found in the care plan section)   Acute Rehab OT Goals Patient Stated Goal: To go home OT Goal Formulation: With patient Time For Goal Achievement: 02/12/24 Potential to Achieve Goals: Good   OT Frequency:  Min 2X/week    AM-PAC OT 6 Clicks Daily Activity     Outcome Measure Help from another person eating meals?: None Help from another person taking care of personal grooming?: A Little Help from another person toileting, which includes using toliet, bedpan, or urinal?: A Lot Help from another person bathing (including washing, rinsing, drying)?: A Lot Help from another person to put on and taking off regular upper body clothing?: A Little Help from another person to put on and taking off regular lower body clothing?: A Lot 6 Click Score: 16   End of Session Equipment Utilized During Treatment: Gait belt;Rolling walker (2 wheels) Nurse Communication: Mobility status  Activity Tolerance: Patient tolerated treatment well Patient left: in bed;with call bell/phone within reach;with bed alarm set  OT  Visit Diagnosis: Unsteadiness on feet  (R26.81);Other abnormalities of gait and mobility (R26.89);Muscle weakness (generalized) (M62.81)                Time: 8996-8960 OT Time Calculation (min): 36 min Charges:  OT General Charges $OT Visit: 1 Visit OT Evaluation $OT Eval Moderate Complexity: 1 Mod OT Treatments $Self Care/Home Management : 8-22 mins  Adrianne BROCKS, OT  Acute Rehabilitation Services Office [phone] Secure chat preferred   Adrianne GORMAN Savers 01/29/2024, 10:49 AM

## 2024-01-29 NOTE — Assessment & Plan Note (Signed)
 Reported single, brief episode of non-radiating, pressure-like sternal chest pain. He has hx of HF as noted above and elevated troponin on admission. Repeat EKG and troponin on 9/3 not concerning for ACS. -Continue to monitor

## 2024-01-29 NOTE — Assessment & Plan Note (Signed)
 Worsening AMS this morning per RN. Pt was Ox2-3 during my exam and cooperate - Continue delirium protocol

## 2024-01-29 NOTE — Assessment & Plan Note (Signed)
 Without red flag signs on exam, but appears to be worsening recently. Lumbar spine in 2015 with advanced generative disc disease of the lower lumbar spine L5-S1 where there is grade 1 spondylolisthesis and possible pars defects. CT Lumbar spine on 01/27/24 with progression to grade 2 spondylolisthesis at L4-L5 and moderate bilateral foraminal stenosis at L3-L4. Patient follows with orthopedics. --Continue gabapentin  for pain  --Oxycodone  5 mg TID (lowered from home dose d/t excessive sedation) --Tylenol  1000 mg BID --Add on lidocaine  patches daily

## 2024-01-29 NOTE — Assessment & Plan Note (Signed)
 Appears EF was 25 to 30% in 2018 but this has recovered to normal EF on last echo 2022. Patient appears euvolemic today. He has an ICD and biventricular pacemaker in place.  --Restart home carvedilol  at reduced dose of 3.125 mg BID --Rosuvastatin  20 mg daily --Hold home Eliquis  5 mg twice daily given low platelet count --Hold Lasix  60 mg daily given lower BP for now

## 2024-01-29 NOTE — TOC Progression Note (Signed)
 Transition of Care [facility] - Progression Note    Patient Details  Name: Anthony [name] MRN: [id number] Date of Birth: [birth year]  Transition of Care [facility]) CM/SW Contact  Gwenn Julien [place], [place] Phone Number: 01/29/2024, 9:37 AM  Clinical Narrative:  Updated PT recommendation received for SNF. Discussed SNF with pt's wife and niece who report agreeable. Pt with SNF stay at [facility] in 2019 and family prefers this facility again. Bed offer received from Kia at [facility]. [employer] auth request submitted for [facility] and [facility], spoke to [place]. Will provide updates as available.   Julien Gwenn, MSW, LCSW [phone] (coverage)      Expected Discharge Plan: Home/Self Care Barriers to Discharge: Continued Medical Work up               Expected Discharge Plan and Services   Discharge Planning Services: CM Consult, Medication Assistance Post Acute Care Choice: NA Living arrangements for the past 2 months: Single Family Home                   DME Agency: NA                   Social Drivers of Health (SDOH) Interventions SDOH Screenings   Food Insecurity: No Food Insecurity (01/26/2024)  Housing: Low Risk  (01/26/2024)  Transportation Needs: No Transportation Needs (01/26/2024)  Utilities: Not At Risk (01/26/2024)  Alcohol Screen: Low Risk  (08/21/2023)  Depression (PHQ2-9): Low Risk  (10/29/2023)  Financial Resource Strain: Low Risk  (08/21/2023)  Physical Activity: Sufficiently Active (08/21/2023)  Social Connections: Socially Integrated (01/26/2024)  Stress: No Stress Concern Present (08/21/2023)  Tobacco Use: Medium Risk (01/26/2024)  Health Literacy: Adequate Health Literacy (08/21/2023)    Readmission Risk Interventions     No data to display

## 2024-01-29 NOTE — NC FL2 (Addendum)
 [place]  MEDICAID FL2 LEVEL OF CARE FORM     IDENTIFICATION  Patient Name: Anthony [name] Birthdate: July 22, 1950 Sex: male Admission Date (Current Location): 01/26/2024  [facility] and [place] Number:  Producer, television/film/video and Address:  [facility], [address], [place], [place] [address]      Provider Number: [id number]  Attending Physician Name and Address:  Donah Laymon PARAS, MD  Relative Name and Phone Number:       Current Level of Care: Hospital Recommended Level of Care: Skilled Nursing Facility Prior Approval Number:    Date Approved/Denied:   PASRR Number: [id number]  Discharge Plan: SNF    Current Diagnoses: Patient Active Problem List   Diagnosis Date Noted   Chest pain at rest 01/27/2024   Type 2 diabetes mellitus with hyperosmolar hyperglycemic state (HHS) (HCC) 01/26/2024   Chronic back pain 01/26/2024   Peripheral neuropathy 10/11/2022   Urinary urgency 06/18/2022   CVA (cerebral vascular accident) (HCC) 02/20/2022   Low back pain with right-sided sciatica 01/07/2022   BPH (benign prostatic hyperplasia) 08/12/2021   Hip weakness 02/27/2021   Left leg weakness 02/17/2021   Neck mass 11/16/2020   Erectile dysfunction 05/31/2020   Polypharmacy 05/22/2020   Porokeratosis 04/26/2020   Other biomechanical lesions of lumbar region 12/07/2019   Neck muscle strain 11/22/2019   Hx of adenomatous colonic polyps    S/P ICD (internal cardiac defibrillator) Medtronic 07/26/2017   NICM (nonischemic cardiomyopathy) (HCC) 07/25/2017   Congestive heart failure, NYHA class 3, chronic, systolic (HCC) 07/25/2017   Atrial fibrillation, permanent (HCC) 07/25/2017   Memory change    Thrombocytopenia (HCC)    Right temporal lobe infarction (HCC) 06/13/2017   Seizures (HCC) 06/12/2017   Chronic kidney disease (CKD), stage IV (severe) (HCC) 01/22/2017   Secondary hyperparathyroidism of renal origin (HCC) 01/22/2017   Chronic anticoagulation  03/06/2016   Chronic, continuous use of opioids 10/18/2014   OSA on CPAP 04/04/2014   History of tobacco abuse 06/28/2013   DIABETIC  RETINOPATHY 02/24/2008   Insulin  dependent type 2 diabetes mellitus (HCC) 07/24/2006   HYPERCHOLESTEROLEMIA 07/24/2006   Depression 07/24/2006   HYPERTENSION, BENIGN SYSTEMIC 07/24/2006   Coronary atherosclerosis 07/24/2006   GASTROESOPHAGEAL REFLUX, NO ESOPHAGITIS 07/24/2006    Orientation RESPIRATION BLADDER Height & Weight     Self, Time, Situation  CPAP at HS Continent Weight: 181 lb 7 oz (82.3 kg) Height:  6' (182.9 cm)  BEHAVIORAL SYMPTOMS/MOOD NEUROLOGICAL BOWEL NUTRITION STATUS      Continent    AMBULATORY STATUS COMMUNICATION OF NEEDS Skin   Extensive Assist Verbally                         Personal Care Assistance Level of Assistance  Bathing, Feeding, Dressing Bathing Assistance: Limited assistance Feeding assistance: Limited assistance Dressing Assistance: Limited assistance     Functional Limitations Info  Sight, Hearing, Speech Sight Info: Impaired Hearing Info: Adequate Speech Info: Adequate    SPECIAL CARE FACTORS FREQUENCY  PT (By licensed PT), OT (By licensed OT)                    Contractures Contractures Info: Not present    Additional Factors Info  Code Status Code Status Info: FUL CODE             Current Medications (01/29/2024):  This is the current hospital active medication list Current Facility-Administered Medications  Medication Dose Route Frequency Provider  Last Rate Last Admin   acetaminophen  (TYLENOL ) tablet 1,000 mg  1,000 mg Oral BID Tharon Lung, MD   1,000 mg at 01/29/24 9171   calcitRIOL  (ROCALTROL ) capsule 0.5 mcg  0.5 mcg Oral Daily Tharon Lung, MD   0.5 mcg at 01/29/24 0828   dextrose  50 % solution 0-50 mL  0-50 mL Intravenous PRN Melvenia Motto, MD       enoxaparin  (LOVENOX ) injection 30 mg  30 mg Subcutaneous Q24H Janna, Adriana, DO   30 mg at 01/28/24 1145   gabapentin   (NEURONTIN ) capsule 200 mg  200 mg Oral Q8H Tharon Lung, MD   200 mg at 01/29/24 9478   insulin  aspart (novoLOG ) injection 0-9 Units  0-9 Units Subcutaneous TID WC Gomes, Adriana, DO   3 Units at 01/28/24 1653   insulin  glargine (LANTUS ) injection 35 Units  35 Units Subcutaneous Daily Janna Ferrier, DO   35 Units at 01/29/24 9162   latanoprost  (XALATAN ) 0.005 % ophthalmic solution 1 drop  1 drop Both Eyes QHS Tharon Lung, MD   1 drop at 01/27/24 2206   lidocaine  (LIDODERM ) 5 % 1 patch  1 patch Transdermal Q24H Tharon Lung, MD   1 patch at 01/28/24 1951   loratadine  (CLARITIN ) tablet 10 mg  10 mg Oral Daily PRN Tharon Lung, MD       metoprolol  tartrate (LOPRESSOR ) tablet 12.5 mg  12.5 mg Oral BID Tharon Lung, MD   12.5 mg at 01/29/24 0827   multivitamin with minerals tablet 1 tablet  1 tablet Oral Daily Tharon Lung, MD   1 tablet at 01/29/24 9171   nitroGLYCERIN  (NITROSTAT ) SL tablet 0.4 mg  0.4 mg Sublingual Q5 min PRN Tharon Lung, MD       oxyCODONE  (Oxy IR/ROXICODONE ) immediate release tablet 5 mg  5 mg Oral TID Tharon Lung, MD   5 mg at 01/29/24 9171   pantoprazole  (PROTONIX ) EC tablet 40 mg  40 mg Oral Daily Tharon Lung, MD   40 mg at 01/29/24 9171   PARoxetine  (PAXIL ) tablet 30 mg  30 mg Oral Daily Tharon Lung, MD   30 mg at 01/29/24 9170   rosuvastatin  (CRESTOR ) tablet 20 mg  20 mg Oral Daily Tharon Lung, MD   20 mg at 01/29/24 9172     Discharge Medications: Please see discharge summary for a list of discharge medications.  Relevant Imaging Results:  Relevant Lab Results:   Additional Information SS#: [id number]  Gwenn Julien [place], [place]

## 2024-01-29 NOTE — Progress Notes (Signed)
 Daily Progress Note Intern Pager: [phone]  Patient name: Anthony [name] Medical record number: [id number] Date of birth: Nov 04, 1950 Age: 73 y.o. Gender: male  Primary Care Provider: Madelon Donald HERO, DO Consultants: None Code Status: Full  Pt Overview and Major Events to Date:  -Admitted: 9/2 -CT Head on 9/2 negative for acute abnormalities  Assessment and Plan: Patient is a 73 year old male presenting with hypotension, hyperglycemia, fatigue found to have HHS. Pertinent PMH/PSH includes insulin -dependent type 2 diabetes mellitus. Assessment & Plan Altered mental status/ Delirium Worsening AMS this morning per RN. Pt was Ox2-3 during my exam and cooperate - Continue delirium protocol Type 2 diabetes mellitus with hyperosmolar hyperglycemic state (HHS) (HCC) Likely secondary to insulin  non-adherence. Patient transitioned to subcutaneous insulin  following improvement in glucose, electrolytes, and normal AG.  --Glargine 40 units daily + Aspart TID before meals --Glucose checks q4h before meals and at bedtime --Carb-modified soft diet --Telemetry monitoring Chronic back pain Without red flag signs on exam, but appears to be worsening recently. Lumbar spine in 2015 with advanced generative disc disease of the lower lumbar spine L5-S1 where there is grade 1 spondylolisthesis and possible pars defects. CT Lumbar spine on 01/27/24 with progression to grade 2 spondylolisthesis at L4-L5 and moderate bilateral foraminal stenosis at L3-L4. Patient follows with orthopedics. --Continue gabapentin  for pain  --Oxycodone  5 mg TID (lowered from home dose d/t excessive sedation) --Tylenol  1000 mg BID --Add on lidocaine  patches daily HYPERTENSION, BENIGN SYSTEMIC BP's soft on admission, likely in the setting of relative dehydration. -Hold home hydralazine  50 mg daily, Losartan -hydrochlorothiazide 50-12.5 mg daily for now OSA on CPAP -Will order CPAP for nighttime use Chronic kidney disease  (CKD), stage IV (severe) (HCC) With superimposed AKI, likely due to relative dehydration. Cr elevated, stable at 2.51 today. --Avoid nephrotoxic agents -AM BMP Congestive heart failure, NYHA class 3, chronic, systolic (HCC) Atrial fibrillation, permanent (HCC) Appears EF was 25 to 30% in 2018 but this has recovered to normal EF on last echo 2022. Patient appears euvolemic today. He has an ICD and biventricular pacemaker in place.  --Restart home carvedilol  at reduced dose of 3.125 mg BID --Rosuvastatin  20 mg daily --Hold home Eliquis  5 mg twice daily given low platelet count --Hold Lasix  60 mg daily given lower BP for now Chest pain at rest Reported single, brief episode of non-radiating, pressure-like sternal chest pain. He has hx of HF as noted above and elevated troponin on admission. Repeat EKG and troponin on 9/3 not concerning for ACS. -Continue to monitor   FEN/GI: Carb-modified soft diet PPx: Lovenox  Dispo:Home pending clinical improvement . Barriers include: patient requiring ongoing medical management.   Subjective:  Sitting at bedside this morning. Appear more confused and more agitate per his RN. RN is also concerned about his urinary retention- increase time required to start voiding . Will plan on bladder scan post void today  Objective: Temp:  [97.7 F (36.5 C)-99.5 F (37.5 C)] 98.7 F (37.1 C) (09/04 0734) Pulse Rate:  [70-78] 77 (09/04 0827) Resp:  [18-20] 20 (09/04 0734) BP: (92-120)/(60-82) 107/79 (09/04 0827) SpO2:  [94 %-99 %] 98 % (09/04 0734) FiO2 (%):  [28 %] 28 % (09/03 2254)  Physical Exam: General: Alert, lying in bed, no acute distress Cardiovascular: Normal rate and rhythm; no M/R/G Respiratory: clear to auscultation bilaterally; normal respiratory effort Abdomen: Soft, non-distended, non-tender Extremities: Non-edematous  Laboratory: Most recent CBC Lab Results  Component Value Date   WBC 6.9 01/29/2024   HGB  11.5 (L) 01/29/2024   HCT 34.2  (L) 01/29/2024   MCV 89.3 01/29/2024   PLT 82 (L) 01/29/2024   Most recent BMP    Latest Ref Rng & Units 01/29/2024    3:05 AM  BMP  Glucose 70 - 99 mg/dL 887   BUN 8 - 23 mg/dL 35   Creatinine 9.38 - 1.24 mg/dL 7.19   Sodium 864 - 854 mmol/L 134   Potassium 3.5 - 5.1 mmol/L 4.7   Chloride 98 - 111 mmol/L 98   CO2 22 - 32 mmol/L 23   Calcium  8.9 - 10.3 mg/dL 8.8     Other pertinent labs: N/A  Imaging/Diagnostic Tests:  CT Head w/o contrast (01/26/24) Radiologist Impression: 1. No acute intracranial abnormality. 2. Old left frontal infarct and chronic ischemic white matter changes.  CT Lumbar Spine w/ contrast (01/27/24) Radiologist Impression:  1. Grade 2 anterolisthesis at L4-L5 due to bilateral pars interarticularis defects, with severe bilateral neural foraminal stenosis at this level. 2. Transitional lumbosacral anatomy with partial sacralization of L5 and bilateral assimilation joints. 3. Moderate bilateral foraminal stenosis at L3-L4.   Suzen Houston NOVAK, DO 01/29/2024, 9:43 AM  PGY-1, [facility] FPTS Intern pager: [phone], text pages welcome Secure chat group [facility] Teaching Service

## 2024-01-29 NOTE — Assessment & Plan Note (Signed)
 With superimposed AKI, likely due to relative dehydration. Cr elevated, stable at 2.51 today. --Avoid nephrotoxic agents -AM BMP

## 2024-01-29 NOTE — Progress Notes (Signed)
 Mobility Specialist Progress Note:    01/29/24 1030  Mobility  Activity Ambulated with assistance  Level of Assistance Minimal assist, patient does 75% or more  Assistive Device Front wheel walker  Distance Ambulated (ft) 20 ft  Activity Response Tolerated well  Mobility Referral Yes  Mobility visit 1 Mobility  Mobility Specialist Start Time (ACUTE ONLY) 1030  Mobility Specialist Stop Time (ACUTE ONLY) 1040  Mobility Specialist Time Calculation (min) (ACUTE ONLY) 10 min   Pt received in bed, agreeable to mobility session. Ambulated in room with MinA throughout session d/t heavy posterior lean. Required many verbal and tactile cues to steady. Pt took sitting rest break d/t fatigue. Sitting up in chair with all needs met, call bell in reach.    Catia Todorov Mobility Specialist Please contact via Special educational needs teacher or  Rehab office at [phone]

## 2024-01-30 ENCOUNTER — Ambulatory Visit: Admitting: Family Medicine

## 2024-01-30 LAB — BASIC METABOLIC PANEL WITH GFR
Anion gap: 11 (ref 5–15)
Anion gap: 13 (ref 5–15)
BUN: 36 mg/dL — ABNORMAL HIGH (ref 8–23)
BUN: 39 mg/dL — ABNORMAL HIGH (ref 8–23)
CO2: 24 mmol/L (ref 22–32)
CO2: 22 mmol/L (ref 22–32)
Calcium: 8.9 mg/dL (ref 8.9–10.3)
Calcium: 9.1 mg/dL (ref 8.9–10.3)
Chloride: 97 mmol/L — ABNORMAL LOW (ref 98–111)
Chloride: 96 mmol/L — ABNORMAL LOW (ref 98–111)
Creatinine, Ser: 3.15 mg/dL — ABNORMAL HIGH (ref 0.61–1.24)
Creatinine, Ser: 3.36 mg/dL — ABNORMAL HIGH (ref 0.61–1.24)
GFR, Estimated: 20 mL/min — ABNORMAL LOW (ref >60)
GFR, Estimated: 19 mL/min — ABNORMAL LOW (ref >60)
Glucose, Bld: 77 mg/dL (ref 70–99)
Glucose, Bld: 289 mg/dL — ABNORMAL HIGH (ref 70–99)
Potassium: 4.6 mmol/L (ref 3.5–5.1)
Potassium: 4.8 mmol/L (ref 3.5–5.1)
Sodium: 132 mmol/L — ABNORMAL LOW (ref 135–145)
Sodium: 131 mmol/L — ABNORMAL LOW (ref 135–145)

## 2024-01-30 LAB — GLUCOSE, CAPILLARY
Glucose-Capillary: 89 mg/dL (ref 70–99)
Glucose-Capillary: 188 mg/dL — ABNORMAL HIGH (ref 70–99)
Glucose-Capillary: 298 mg/dL — ABNORMAL HIGH (ref 70–99)
Glucose-Capillary: 207 mg/dL — ABNORMAL HIGH (ref 70–99)

## 2024-01-30 LAB — CBC
HCT: 35.8 % — ABNORMAL LOW (ref 39.0–52.0)
Hemoglobin: 12.2 g/dL — ABNORMAL LOW (ref 13.0–17.0)
MCH: 30.1 pg (ref 26.0–34.0)
MCHC: 34.1 g/dL (ref 30.0–36.0)
MCV: 88.4 fL (ref 80.0–100.0)
Platelets: 90 K/uL — ABNORMAL LOW (ref 150–400)
RBC: 4.05 MIL/uL — ABNORMAL LOW (ref 4.22–5.81)
RDW: 14.6 % (ref 11.5–15.5)
WBC: 6.1 K/uL (ref 4.0–10.5)
nRBC: 0.3 % — ABNORMAL HIGH (ref 0.0–0.2)

## 2024-01-30 MED ORDER — LIDOCAINE 5 % EX PTCH: 1.0000 | MEDICATED_PATCH | CUTANEOUS | Status: DC

## 2024-01-30 MED ORDER — METOPROLOL TARTRATE 25 MG PO TABS: 12.5000 mg | ORAL_TABLET | Freq: Two times a day (BID) | ORAL | Status: DC

## 2024-01-30 MED ORDER — ENSURE PLUS HIGH PROTEIN PO LIQD: 237.0000 mL | Freq: Two times a day (BID) | ORAL | Status: AC

## 2024-01-30 MED ORDER — INSULIN GLARGINE 100 UNIT/ML ~~LOC~~ SOLN
30.0000 [IU] | Freq: Every day | SUBCUTANEOUS | Status: DC
  Administered 2024-01-30 – 2024-01-31 (×2): 30 [IU] via SUBCUTANEOUS
  Filled 2024-01-30 (×2): qty 0.3

## 2024-01-30 MED ORDER — APIXABAN 5 MG PO TABS: 5.0000 mg | ORAL_TABLET | Freq: Two times a day (BID) | ORAL | Status: DC

## 2024-01-30 MED ORDER — ENSURE PLUS HIGH PROTEIN PO LIQD
237.0000 mL | Freq: Two times a day (BID) | ORAL | Status: DC
  Administered 2024-01-30 – 2024-01-31 (×4): 237 mL via ORAL

## 2024-01-30 MED ORDER — TRESIBA FLEXTOUCH 200 UNIT/ML ~~LOC~~ SOPN: 30.0000 [IU] | PEN_INJECTOR | SUBCUTANEOUS | Status: DC

## 2024-01-30 MED ORDER — APIXABAN 5 MG PO TABS
5.0000 mg | ORAL_TABLET | Freq: Two times a day (BID) | ORAL | Status: DC
  Administered 2024-01-31: 5 mg via ORAL
  Filled 2024-01-30: qty 1

## 2024-01-30 MED ORDER — OXYCODONE HCL 10 MG PO TABS: 5.0000 mg | ORAL_TABLET | Freq: Three times a day (TID) | ORAL | Status: DC

## 2024-01-30 MED ORDER — GABAPENTIN 100 MG PO CAPS: 200.0000 mg | ORAL_CAPSULE | Freq: Three times a day (TID) | ORAL | Status: DC

## 2024-01-30 NOTE — Assessment & Plan Note (Deleted)
 Worsening AMS this morning per RN. Pt was Ox2-3 during my exam and cooperate - Continue delirium protocol

## 2024-01-30 NOTE — Assessment & Plan Note (Deleted)
 Without red flag signs on exam, but appears to be worsening recently. Lumbar spine in 2015 with advanced generative disc disease of the lower lumbar spine L5-S1 where there is grade 1 spondylolisthesis and possible pars defects. CT Lumbar spine on 01/27/24 with progression to grade 2 spondylolisthesis at L4-L5 and moderate bilateral foraminal stenosis at L3-L4. Patient follows with orthopedics. --Continue gabapentin  for pain  --Oxycodone  5 mg TID (lowered from home dose d/t excessive sedation) --Tylenol  1000 mg BID --Add on lidocaine  patches daily

## 2024-01-30 NOTE — Assessment & Plan Note (Signed)
 Appears EF was 25 to 30% in 2018 but this has recovered to normal EF on last echo 2022. Patient appears euvolemic today. He has an ICD and biventricular pacemaker in place. Replaced home carvedilol  with metoprolol  given patient's soft BP's. --Metoprolol  tartrate 12.5 mg BID --Rosuvastatin  20 mg daily --Hold home Eliquis  5 mg twice daily given low platelet count --Hold Lasix  60 mg daily given lower BP for now

## 2024-01-30 NOTE — Progress Notes (Signed)
 Patient has CPAP in room if he changes his mind but declined use tonight.

## 2024-01-30 NOTE — Progress Notes (Signed)
 Daily Progress Note Intern Pager: [phone]  Patient name: Anthony [name] Medical record number: [id number] Date of birth: Jan 22, 1951 Age: 73 y.o. Gender: male  Primary Care Provider: Madelon Donald HERO, DO Consultants: None Code Status: Full  Pt Overview and Major Events to Date:  --Admitted: 9/2 --CT Head on 9/2 negative for acute abnormalities  Assessment and Plan: Patient is a 73 year old male presenting with hypotension, hyperglycemia, fatigue found to have HHS. Pertinent PMH/PSH includes insulin -dependent type 2 diabetes mellitus. Assessment & Plan Altered mental status/ Delirium Patient intermittently confused at times. Patient alert and oriented to time, person, and situation on exam today, and answers questions appropriately. CT Head negative for acute intracranial abnormalities, however notable for prior left frontal infarct and chronic ischemic white matter changes, perhaps leading to mild cognitive impairment. - Continue delirium protocol Type 2 diabetes mellitus with hyperosmolar hyperglycemic state (HHS) (HCC) Likely secondary to insulin  non-adherence. Patient transitioned to subcutaneous insulin  following improvement in glucose, electrolytes, and normal AG. His blood glucose has been dipping into the 80s, therefore will decrease basal insulin  dose. --Glargine 30 units daily + Aspart TID before meals --Glucose checks q4h before meals and at bedtime --Carb-modified soft diet Chronic back pain Without red flag signs on exam, but appears to be worsening recently. Lumbar spine in 2015 with advanced generative disc disease of the lower lumbar spine L5-S1 where there is grade 1 spondylolisthesis and possible pars defects. CT Lumbar spine on 01/27/24 with progression to grade 2 spondylolisthesis at L4-L5 and moderate bilateral foraminal stenosis at L3-L4. Patient follows with orthopedics. --Continue gabapentin  for pain  --Oxycodone  5 mg TID (lowered from home dose d/t excessive  sedation) --Tylenol  1000 mg BID --Lidocaine  patches daily HYPERTENSION, BENIGN SYSTEMIC Blood pressures soft on admission and throughout hospitalization, likely in the setting of relative dehydration. -Hold home hydralazine  50 mg daily, Losartan -hydrochlorothiazide 50-12.5 mg daily for now OSA on CPAP --Will order CPAP for nighttime use Chronic kidney disease (CKD), stage IV (severe) (HCC) With superimposed AKI, likely due to relative dehydration. Cr elevated to 3.15 today, however GFR is stable. --Avoid nephrotoxic agents -AM BMP Congestive heart failure, NYHA class 3, chronic, systolic (HCC) Atrial fibrillation, permanent (HCC) Appears EF was 25 to 30% in 2018 but this has recovered to normal EF on last echo 2022. Patient appears euvolemic today. He has an ICD and biventricular pacemaker in place. Replaced home carvedilol  with metoprolol  given patient's soft BP's. --Metoprolol  tartrate 12.5 mg BID --Rosuvastatin  20 mg daily --Hold home Eliquis  5 mg twice daily given low platelet count --Hold Lasix  60 mg daily given lower BP for now Chest pain at rest Reported single, brief episode of non-radiating, pressure-like sternal chest pain. He has hx of HF as noted above and elevated troponin on admission. Repeat EKG and troponin on 9/3 not concerning for ACS. --Continue to monitor  FEN/GI: Carb-modified soft diet PPx: Lovenox  Dispo:SNF pending placement.  Subjective:  Patient seen and evaluated at bedside eating breakfast accompanied by his niece. Patient was speaking to his wife on the phone. On assessment today, patient is alert and oriented to time, person, and situation. Patient reports he is doing well. Patient allowed me to talk to his wife via phone. Updated her on patient's medical course, dispo plan for SNF and answered all questions.   Objective: Temp:  [97.6 F (36.4 C)-98.5 F (36.9 C)] 98 F (36.7 C) (09/05 1212) Pulse Rate:  [68-70] 70 (09/05 1212) Resp:  [16-18] 18 (09/05  1212) BP: (91-128)/(57-86) 97/65 (  09/05 1212) SpO2:  [98 %-100 %] 100 % (09/05 1212)  Physical Exam: General: Alert, sitting up eating breakfast, NAD Cardiovascular: Normal rate and rhythm; no M/R/G Respiratory: Clear to auscultation bilaterally; normal respiratory effort Abdomen: Soft, non-distended, non-tender Extremities: Non-edematous  Laboratory: Most recent CBC Lab Results  Component Value Date   WBC 6.1 01/30/2024   HGB 12.2 (L) 01/30/2024   HCT 35.8 (L) 01/30/2024   MCV 88.4 01/30/2024   PLT 90 (L) 01/30/2024   Most recent BMP    Latest Ref Rng & Units 01/30/2024    8:47 AM  BMP  Glucose 70 - 99 mg/dL 77   BUN 8 - 23 mg/dL 36   Creatinine 9.38 - 1.24 mg/dL 6.84   Sodium 864 - 854 mmol/L 132   Potassium 3.5 - 5.1 mmol/L 4.6   Chloride 98 - 111 mmol/L 97   CO2 22 - 32 mmol/L 24   Calcium  8.9 - 10.3 mg/dL 8.9     Other pertinent labs: N/A  Imaging/Diagnostic Tests:  CT Head w/o contrast (01/26/24) Radiologist Impression:  1. No acute intracranial abnormality. 2. Old left frontal infarct and chronic ischemic white matter changes.  CT Lumbar Spine w/ contrast (01/27/24) Radiologist Impression:  1. Grade 2 anterolisthesis at L4-L5 due to bilateral pars interarticularis defects, with severe bilateral neural foraminal stenosis at this level. 2. Transitional lumbosacral anatomy with partial sacralization of L5 and bilateral assimilation joints. 3. Moderate bilateral foraminal stenosis at L3-L4.   Anthony Ashley SAILOR, MD 01/30/2024, 2:13 PM  PGY-1, [facility] FPTS Intern pager: [phone], text pages welcome Secure chat group [facility] Teaching Service

## 2024-01-30 NOTE — Progress Notes (Signed)
 Physical Therapy Treatment Patient Details Name: Anthony [name] MRN: [id number] DOB: [birth year] Today's Date: 01/30/2024   History of Present Illness 73 y.o. male presents to [facility] hospital on 01/26/2024 with hypotension, hyperglycemia and fatigue. PMH includes DMII, HTN, OSA, CHF, afib, depression, seizure, ICD placement, CVA and chronic back pain.    PT Comments  Pt admitted with above diagnosis. Pt was able to ambulate with RW and incr distance. Continues to need cues and CGA for safety.  Pt responds well to cuing but unsure of carryover. Will continue PT.  Pt currently with functional limitations due to the deficits listed below (see PT Problem List). Pt will benefit from acute skilled PT to increase their independence and safety with mobility to allow discharge.       If plan is discharge home, recommend the following: A little help with walking and/or transfers;A lot of help with bathing/dressing/bathroom;Assistance with cooking/housework;Direct supervision/assist for medications management;Direct supervision/assist for financial management;Assist for transportation;Help with stairs or ramp for entrance;Supervision due to cognitive status   Can travel by private vehicle     Yes  Equipment Recommendations  Rolling walker (2 wheels);BSC/3in1    Recommendations for Other Services       Precautions / Restrictions Precautions Precautions: Fall Recall of Precautions/Restrictions: Impaired Restrictions Weight Bearing Restrictions Per Provider Order: No     Mobility  Bed Mobility               General bed mobility comments: Pt EOB on arrival    Transfers Overall transfer level: Needs assistance Equipment used: Rolling walker (2 wheels) Transfers: Sit to/from Stand Sit to Stand: Mod assist           General transfer comment: Mod assist to stand to RW iintially as pt was initially attempting to pull up on RW with both hands. With cues, needed mod assist intiially to stand and  took incr time to get balance. Once in standing posterior lean intially needing min assist to right pt.    Ambulation/Gait Ambulation/Gait assistance: Min assist Gait Distance (Feet): 40 Feet Assistive device: Rolling walker (2 wheels) Gait Pattern/deviations: Step-to pattern, Knee flexed in stance - right, Knee flexed in stance - left, Trunk flexed, Shuffle Gait velocity: reduced Gait velocity interpretation: <1.31 ft/sec, indicative of household ambulator   General Gait Details: pt with slowed step-to gaitneeding cues to incr step length, stand tall and to stay close to RW. Pt also needed assist to steer RW. Pt able to incr distance today and no LOB with use of RW however as stated needs min cues for safety and CGA.   Stairs             Wheelchair Mobility     Tilt Bed    Modified Rankin (Stroke Patients Only)       Balance Overall balance assessment: Needs assistance Sitting-balance support: No upper extremity supported, Feet supported Sitting balance-Leahy Scale: Fair     Standing balance support: Bilateral upper extremity supported, Reliant on assistive device for balance Standing balance-Leahy Scale: Poor Standing balance comment: Posterior lean and min to mod assist to maintain standing                            Communication Communication Communication: Impaired Factors Affecting Communication: Reduced clarity of speech;Difficulty expressing self  Cognition Arousal: Alert Behavior During Therapy: Flat affect   PT - Cognitive impairments: No family/caregiver present to determine baseline, Memory, Awareness, Safety/Judgement  Following commands: Impaired Following commands impaired: Follows one step commands with increased time    Cueing Cueing Techniques: Verbal cues, Visual cues  Exercises Other Exercises Other Exercises: Practiced 5 sit to stands with pt able to stand 5 x without assist once he had gone  on his walk.    General Comments General comments (skin integrity, edema, etc.): VSS      Pertinent Vitals/Pain Pain Assessment Pain Assessment: No/denies pain    Home Living                          Prior Function            PT Goals (current goals can now be found in the care plan section) Acute Rehab PT Goals Patient Stated Goal: to return home Progress towards PT goals: Progressing toward goals    Frequency    Min 3X/week      PT Plan      Co-evaluation              AM-PAC PT 6 Clicks Mobility   Outcome Measure  Help needed turning from your back to your side while in a flat bed without using bedrails?: A Little Help needed moving from lying on your back to sitting on the side of a flat bed without using bedrails?: A Little Help needed moving to and from a bed to a chair (including a wheelchair)?: A Little Help needed standing up from a chair using your arms (e.g., wheelchair or bedside chair)?: A Little Help needed to walk in hospital room?: A Little Help needed climbing 3-5 steps with a railing? : A Lot 6 Click Score: 17    End of Session Equipment Utilized During Treatment: Gait belt Activity Tolerance: Patient tolerated treatment well Patient left: in bed;with call bell/phone within reach;with bed alarm set (sitting EOB) Nurse Communication: Mobility status PT Visit Diagnosis: Other abnormalities of gait and mobility (R26.89);Muscle weakness (generalized) (M62.81)     Time: 8746-8691 PT Time Calculation (min) (ACUTE ONLY): 15 min  Charges:    $Gait Training: 8-22 mins PT General Charges $$ ACUTE PT VISIT: 1 Visit                     [name] M,PT Acute Rehab Services [phone]    [name] 01/30/2024, 3:13 PM

## 2024-01-30 NOTE — Assessment & Plan Note (Deleted)
 Reported single, brief episode of non-radiating, pressure-like sternal chest pain. He has hx of HF as noted above and elevated troponin on admission. Repeat EKG and troponin on 9/3 not concerning for ACS. -Continue to monitor

## 2024-01-30 NOTE — Assessment & Plan Note (Deleted)
-  Will order CPAP for nighttime use

## 2024-01-30 NOTE — TOC Progression Note (Addendum)
 Transition of Care [facility]) - Progression Note    Patient Details  Name: Anthony [name] MRN: [id number] Date of Birth: 1950-09-07  [facility] CM/SW Contact  Gwenn Frieze [place], [place] Phone Number: 01/30/2024, 12:41 PM  Clinical Narrative:  Voicemail left for Jori with [employer] requesting update re auth status for [name] and [facility]. Per MD, pt medically stable for dc. Confirmed with Kia in [facility] admissions they are prepared to admit pt pending auth. MD aware of barrier to dc.   UPDATE 1340: Return call received from [facility] with [employer] who reports they would like to offer at peer-to-peer review for SNF by 10am tomorrow. Peer-to-peer can be completed by calling Dr Janit of [employer] directly at [phone]. Auth received for [facility] [id number]. MD updated.   UPDATE 1520: MD completed peer-to-peer and reports SNF has been approved. Spoke to [place] with [employer] who provided auth details: #[id number], 7 days. Pt's niece [name] updated and confirmed she will bring pt's home CPAP to the facility tomorrow. Kia at [facility] updated and confirmed they are able to admit pt over weekend. SW will follow.   Frieze Gwenn, MSW, LCSW [phone] (coverage)       Expected Discharge Plan: Skilled Nursing Facility Barriers to Discharge: Continued Medical Work up               Expected Discharge Plan and Services   Discharge Planning Services: CM Consult, Medication Assistance Post Acute Care Choice: NA Living arrangements for the past 2 months: Single Family Home                   DME Agency: NA                   Social Drivers of Health (SDOH) Interventions SDOH Screenings   Food Insecurity: No Food Insecurity (01/26/2024)  Housing: Low Risk  (01/26/2024)  Transportation Needs: No Transportation Needs (01/26/2024)  Utilities: Not At Risk (01/26/2024)  Alcohol Screen: Low Risk  (08/21/2023)  Depression (PHQ2-9):  Low Risk  (10/29/2023)  Financial Resource Strain: Low Risk  (08/21/2023)  Physical Activity: Sufficiently Active (08/21/2023)  Social Connections: Socially Integrated (01/26/2024)  Stress: No Stress Concern Present (08/21/2023)  Tobacco Use: Medium Risk (01/26/2024)  Health Literacy: Adequate Health Literacy (08/21/2023)    Readmission Risk Interventions     No data to display

## 2024-01-30 NOTE — Assessment & Plan Note (Signed)
 Reported single, brief episode of non-radiating, pressure-like sternal chest pain. He has hx of HF as noted above and elevated troponin on admission. Repeat EKG and troponin on 9/3 not concerning for ACS. -Continue to monitor

## 2024-01-30 NOTE — Plan of Care (Signed)
  Problem: Education: Goal: Knowledge of General Education information will improve Description: Including pain rating scale, medication(s)/side effects and non-pharmacologic comfort measures Outcome: Progressing   Problem: Health Behavior/Discharge Planning: Goal: Ability to manage health-related needs will improve Outcome: Progressing   Problem: Clinical Measurements: Goal: Will remain free from infection Outcome: Progressing Goal: Diagnostic test results will improve Outcome: Progressing Goal: Cardiovascular complication will be avoided Outcome: Progressing   Problem: Nutrition: Goal: Adequate nutrition will be maintained Outcome: Progressing   Problem: Elimination: Goal: Will not experience complications related to urinary retention Outcome: Progressing   Problem: Pain Managment: Goal: General experience of comfort will improve and/or be controlled Outcome: Progressing   Problem: Safety: Goal: Ability to remain free from injury will improve Outcome: Progressing   Problem: Education: Goal: Ability to describe self-care measures that may prevent or decrease complications (Diabetes Survival Skills Education) will improve Outcome: Progressing   Problem: Nutritional: Goal: Maintenance of adequate nutrition will improve Outcome: Progressing

## 2024-01-30 NOTE — Assessment & Plan Note (Signed)
 Patient intermittently confused at times. Patient alert and oriented to time, person, and situation on exam today, and answers questions appropriately. CT Head negative for acute intracranial abnormalities, however notable for prior left frontal infarct and chronic ischemic white matter changes, perhaps leading to mild cognitive impairment. - Continue delirium protocol

## 2024-01-30 NOTE — Assessment & Plan Note (Deleted)
 Likely secondary to insulin  non-adherence. Patient transitioned to subcutaneous insulin  following improvement in glucose, electrolytes, and normal AG. Decreased insulin , only eating 25% of meals --Glargine 30 units daily + Aspart TID before meals --Glucose checks q4h before meals and at bedtime --Carb-modified soft diet --Telemetry monitoring

## 2024-01-30 NOTE — Assessment & Plan Note (Deleted)
 Appears EF was 25 to 30% in 2018 but this has recovered to normal EF on last echo 2022. Patient appears euvolemic today. He has an ICD and biventricular pacemaker in place.  --Metoprolol  12.5 mg BID --Rosuvastatin  20 mg daily --Hold home Eliquis  5 mg twice daily given low platelet count --Hold Lasix  60 mg daily given lower BP for now

## 2024-01-30 NOTE — Assessment & Plan Note (Signed)
 Blood pressures soft on admission and throughout hospitalization, likely in the setting of relative dehydration. -Hold home hydralazine  50 mg daily, Losartan -hydrochlorothiazide 50-12.5 mg daily for now

## 2024-01-30 NOTE — Discharge Summary (Addendum)
 Family Medicine Teaching [facility] Discharge Summary  Patient name: Anthony [name] Medical record number: [id number] Date of birth: 12-Apr-1951 Age: 73 y.o. Gender: male Date of Admission: 01/26/2024  Date of Discharge: 01/31/2024 Admitting Physician: Ashley LOISE Gravely, MD  Primary Care Provider: Madelon Donald HERO, DO Consultants: None  Indication for Hospitalization: HHS  Discharge Diagnoses/Problem List:  Principal Problem for Admission: HHS Other Problems addressed during stay:  Principal Problem:   Type 2 diabetes mellitus with hyperosmolar hyperglycemic state (HHS) (HCC) Active Problems:   HYPERTENSION, BENIGN SYSTEMIC   OSA on CPAP   Chronic kidney disease (CKD), stage IV (severe) (HCC)   Congestive heart failure, NYHA class 3, chronic, systolic (HCC)   Atrial fibrillation, permanent (HCC)   Chronic back pain   Chest pain at rest   Altered mental status/ Delirium   Brief Hospital Course:  Anthony [name] is a 73 y.o.male with a history of insulin -dependent T2DM who was admitted to the [facility] Medicine Teaching Service at [facility] for [facility]. His hospital course is detailed below:  Hyperosmolar hyperglycemic state Patient presented with blood glucose >900, hypotension, fatigue, hyperosmolality, and normal pH, meeting criteria for HHS. Patient was started on Endotool and IVF with improvement of blood glucose. Was transitioned to subQ insulin  with appropriate overlap with Endotool once sugars stabilized in the 200-250 range and hyperosmolality normalized. Discharged with insulin  regimen as follows: 33 units insulin  degludec with SSI as needed with meals.  Hypokalemia Patient found to have low K of 2.7 after intiating Endotool. Patient received oral and IV potassium repletion. Endotool was paused and resumed as indicated with eventual transition to subQ insulin  as above. Potassium was normal at discharge.  Acute Kidney Injury / Hypotension Patient found to have elevated Cr  of 3.3 on admission alongside low blood pressure readings. Patient's home antihypertensives and beta blocker were held. Metoprolol  was restarted in place of home Carvedilol  given soft blood pressures throughout admission. Discontinued Carvedilol  and discharged with Metoprolol  Tartrate 12.5 mg BID.   Chronic back pain Patient underwent CT lumbar spine for evaluation of chronic back pain, which showed ongoing bilateral foraminal stenosis at L3-L4 as previously noted in 2015 imaging, and progression to grade 2 spondylolisthesis at L4-L5. Home gabapentin  dose was decreased out of concern for oversedation to 5 mg TID.  Other chronic conditions were medically managed with home medications and formulary alternatives as necessary (CHF, Afib, HTN, CKD stage IV).  PCP Follow-up Recommendations: Ensure he is able to take his insulin  regimen at home and adjust as needed. Assess need for neurosurgical follow-up for severe lumbar stenosis, the patient currently declines surgical intervention. Ensure he is scheduled with [facility] for repeat epidural steroid injection chronic back pain, he should have these every 3 months. Evaluate home anti-hypertensive medication regimen, which was adjusted during hospitalization due to hypotension and AKI.  Results/Tests Pending at Time of Discharge: none  Disposition: SNF  Discharge Condition: Stable  Discharge Exam:  Vitals:   01/31/24 0816 01/31/24 1130  BP: 102/66 111/80  Pulse: 70 70  Resp:  18  Temp:  98 F (36.7 C)  SpO2: 95% 96%   Physical Exam: General: Alert, lying in bed, no acute distress Cardiovascular: Normal rate and rhythm; no M/R/G Respiratory: clear to auscultation bilaterally; normal respiratory effort Abdomen: Normoactive bowel sounds; soft, non-distended, non-tender Extremities: Non-edematous  Significant Procedures: None  Significant Labs and Imaging:  Recent Labs  Lab 01/30/24 0847  WBC 6.1  HGB 12.2*  HCT 35.8*  PLT 90*  Recent Labs  Lab 01/30/24 0847 01/30/24 1701 01/31/24 0344  NA 132* 131* 129*  K 4.6 4.8 4.9  CL 97* 96* 97*  CO2 24 22 24   GLUCOSE 77 289* 375*  BUN 36* 39* 42*  CREATININE 3.15* 3.36* 3.14*  CALCIUM  8.9 9.1 8.6*    Imaging/Diagnostic Tests:  Chest X-Ray (01/26/24): Impression from Radiologist:  1. No acute findings. 2. Cardiomegaly, calcified aorta, and left pacer in place.   CT Head w/o contrast (01/26/24) Radiologist Impression:  1. No acute intracranial abnormality. 2. Old left frontal infarct and chronic ischemic white matter changes.   CT Lumbar Spine w/ contrast (01/27/24) Radiologist Impression:  1. Grade 2 anterolisthesis at L4-L5 due to bilateral pars interarticularis defects, with severe bilateral neural foraminal stenosis at this level. 2. Transitional lumbosacral anatomy with partial sacralization of L5 and bilateral assimilation joints. 3. Moderate bilateral foraminal stenosis at L3-L4.   Discharge Medications:  Allergies as of 01/31/2024       Reactions   Enalapril Maleate Cough        Medication List     PAUSE taking these medications    furosemide  20 MG tablet Wait to take this until your doctor or other care provider tells you to start again. Commonly known as: LASIX  Take 3 tablets (60 mg total) by mouth daily.   hydrALAZINE  50 MG tablet Wait to take this until your doctor or other care provider tells you to start again. Commonly known as: APRESOLINE  Take 1 tablet (50 mg total) by mouth 3 (three) times daily.   losartan -hydrochlorothiazide 50-12.5 MG tablet Wait to take this until your doctor or other care provider tells you to start again. Commonly known as: HYZAAR TAKE 1 TABLET BY MOUTH EVERY DAY       STOP taking these medications    carvedilol  25 MG tablet Commonly known as: COREG    NovoLOG  FlexPen 100 UNIT/ML FlexPen Generic drug: insulin  aspart   potassium chloride  SA 20 MEQ tablet Commonly known as: Klor-Con  M20        TAKE these medications    acetaminophen  650 MG CR tablet Commonly known as: TYLENOL  Take 1,300 mg by mouth 2 (two) times daily as needed for pain.   apixaban  5 MG Tabs tablet Commonly known as: Eliquis  Take 1 tablet (5 mg total) by mouth 2 (two) times daily.   calcitRIOL  0.5 MCG capsule Commonly known as: ROCALTROL  Take 0.5 mcg by mouth daily.   dorzolamide -timolol  2-0.5 % ophthalmic solution Commonly known as: COSOPT  Place 1 drop into both eyes 2 (two) times daily.   feeding supplement Liqd Take 237 mLs by mouth 2 (two) times daily between meals.   FreeStyle Libre 3 Plus Sensor Misc USE 1 SENSOR EVERY 14 DAYS   gabapentin  100 MG capsule Commonly known as: NEURONTIN  Take 2 capsules (200 mg total) by mouth every 8 (eight) hours. What changed:  medication strength See the new instructions.   latanoprost  0.005 % ophthalmic solution Commonly known as: XALATAN  Place 1 drop into both eyes at bedtime.   lidocaine  5 % Commonly known as: LIDODERM  Place 1 patch onto the skin daily. Remove & Discard patch within 12 hours or as directed by MD   loratadine  10 MG tablet Commonly known as: CLARITIN  Take 10 mg by mouth daily as needed for allergies.   metoprolol  tartrate 25 MG tablet Commonly known as: LOPRESSOR  Take 0.5 tablets (12.5 mg total) by mouth 2 (two) times daily.   multivitamin tablet Take 1 tablet by mouth daily.  nitroGLYCERIN  0.4 MG SL tablet Commonly known as: NITROSTAT  Place 1 tablet (0.4 mg total) under the tongue every 5 (five) minutes as needed for chest pain. If no relief by 3rd tab, call 911   omeprazole  40 MG capsule Commonly known as: PRILOSEC TAKE 1 CAPSULE BY MOUTH DAILY BEFORE SUPPER   onetouch ultrasoft lancets Use as instructed   Oxycodone  HCl 10 MG Tabs Take 0.5 tablets (5 mg total) by mouth 3 (three) times daily. What changed: how much to take   PARoxetine  30 MG tablet Commonly known as: PAXIL  TAKE 1 TABLET BY MOUTH EVERY DAY    Pen Needles 32G X 4 MM Misc 1 Device by Does not apply route as directed.   rosuvastatin  20 MG tablet Commonly known as: CRESTOR  Take 1 tablet (20 mg total) by mouth daily.   tamsulosin  0.4 MG Caps capsule Commonly known as: FLOMAX  TAKE 1 CAPSULE BY MOUTH EVERYDAY AT BEDTIME   Tresiba  FlexTouch 200 UNIT/ML FlexTouch Pen Generic drug: insulin  degludec Inject 34 Units into the skin daily. What changed: how much to take        Discharge Instructions: Please refer to Patient Instructions section of EMR for full details.  Patient was counseled important signs and symptoms that should prompt return to medical care, changes in medications, dietary instructions, activity restrictions, and follow up appointments.   Follow-Up Appointments:  Contact information for after-discharge care     Destination     [facility] .   Service: Skilled Nursing Contact information: [address] [place]   [address] [phone]                    Mannie Ashley SAILOR, MD 01/30/2024, 12:23 PM PGY-1, [facility]   I have discussed the above with Dr. Mannie and agree with the documented plan. My edits for correction/addition/clarification are included above. Please see any attending notes.   Kathrine Melena, DO PGY-2, [facility] 01/31/2024 12:57 PM

## 2024-01-30 NOTE — Progress Notes (Signed)
   01/30/24 2218  BiPAP/CPAP/SIPAP  BiPAP/CPAP/SIPAP DREAMSTATIOND  Reason BIPAP/CPAP not in use Non-compliant   Patient has CPAP in his room, but declined use tonight.

## 2024-01-30 NOTE — Assessment & Plan Note (Deleted)
 With superimposed AKI, likely due to relative dehydration. Cr elevated, stable at 2.51 today. --Avoid nephrotoxic agents -AM BMP

## 2024-01-30 NOTE — Assessment & Plan Note (Signed)
 Likely secondary to insulin  non-adherence. Patient transitioned to subcutaneous insulin  following improvement in glucose, electrolytes, and normal AG. His blood glucose has been dipping into the 80s, therefore will decrease basal insulin  dose. --Glargine 30 units daily + Aspart TID before meals --Glucose checks q4h before meals and at bedtime --Carb-modified soft diet

## 2024-01-30 NOTE — Assessment & Plan Note (Signed)
 With superimposed AKI, likely due to relative dehydration. Cr elevated to 3.15 today, however GFR is stable. --Avoid nephrotoxic agents -AM BMP

## 2024-01-30 NOTE — Progress Notes (Signed)
 PHARMACY - PHYSICIAN COMMUNICATION CRITICAL VALUE ALERT - BLOOD CULTURE IDENTIFICATION (BCID)  Anthony [name] is an 73 y.o. male who presented to [facility] on 01/26/2024 with a chief complaint of hyperglycemia, weakness  9/1 Blood>>1/4 gram positive rods  Name of physician (or Provider) Contacted: Dr. Lennie  Current antibiotics: None  Changes to prescribed antibiotics recommended:  Continue to monitor off anti-biotics  No results found. However, due to the size of the patient record, not all encounters were searched. Please check Results Review for a complete set of results.  Lynwood Mckusick, PharmD, BCPS Clinical Pharmacist Phone: [phone]

## 2024-01-30 NOTE — Assessment & Plan Note (Signed)
 Without red flag signs on exam, but appears to be worsening recently. Lumbar spine in 2015 with advanced generative disc disease of the lower lumbar spine L5-S1 where there is grade 1 spondylolisthesis and possible pars defects. CT Lumbar spine on 01/27/24 with progression to grade 2 spondylolisthesis at L4-L5 and moderate bilateral foraminal stenosis at L3-L4. Patient follows with orthopedics. --Continue gabapentin  for pain  --Oxycodone  5 mg TID (lowered from home dose d/t excessive sedation) --Tylenol  1000 mg BID --Lidocaine  patches daily

## 2024-01-30 NOTE — Assessment & Plan Note (Signed)
--  Will order CPAP for nighttime use

## 2024-01-30 NOTE — Assessment & Plan Note (Deleted)
 BP's soft on admission, likely in the setting of relative dehydration. -Hold home hydralazine  50 mg daily, Losartan -hydrochlorothiazide 50-12.5 mg daily for now

## 2024-01-31 DIAGNOSIS — E1101 Type 2 diabetes mellitus with hyperosmolarity with coma: Secondary | ICD-10-CM | POA: Diagnosis not present

## 2024-01-31 DIAGNOSIS — Z87891 Personal history of nicotine dependence: Secondary | ICD-10-CM | POA: Diagnosis not present

## 2024-01-31 DIAGNOSIS — Z7901 Long term (current) use of anticoagulants: Secondary | ICD-10-CM | POA: Diagnosis not present

## 2024-01-31 DIAGNOSIS — W19XXXD Unspecified fall, subsequent encounter: Secondary | ICD-10-CM | POA: Diagnosis not present

## 2024-01-31 DIAGNOSIS — M5441 Lumbago with sciatica, right side: Secondary | ICD-10-CM | POA: Diagnosis not present

## 2024-01-31 DIAGNOSIS — W19XXXA Unspecified fall, initial encounter: Secondary | ICD-10-CM | POA: Diagnosis present

## 2024-01-31 DIAGNOSIS — Z950 Presence of cardiac pacemaker: Secondary | ICD-10-CM | POA: Diagnosis not present

## 2024-01-31 DIAGNOSIS — G9341 Metabolic encephalopathy: Secondary | ICD-10-CM | POA: Diagnosis present

## 2024-01-31 DIAGNOSIS — I639 Cerebral infarction, unspecified: Secondary | ICD-10-CM | POA: Diagnosis not present

## 2024-01-31 DIAGNOSIS — I5022 Chronic systolic (congestive) heart failure: Secondary | ICD-10-CM | POA: Diagnosis not present

## 2024-01-31 DIAGNOSIS — D631 Anemia in chronic kidney disease: Secondary | ICD-10-CM | POA: Diagnosis not present

## 2024-01-31 DIAGNOSIS — I252 Old myocardial infarction: Secondary | ICD-10-CM | POA: Diagnosis not present

## 2024-01-31 DIAGNOSIS — I4821 Permanent atrial fibrillation: Secondary | ICD-10-CM | POA: Diagnosis not present

## 2024-01-31 DIAGNOSIS — I152 Hypertension secondary to endocrine disorders: Secondary | ICD-10-CM | POA: Diagnosis not present

## 2024-01-31 DIAGNOSIS — Z833 Family history of diabetes mellitus: Secondary | ICD-10-CM | POA: Diagnosis not present

## 2024-01-31 DIAGNOSIS — E1159 Type 2 diabetes mellitus with other circulatory complications: Secondary | ICD-10-CM | POA: Diagnosis not present

## 2024-01-31 DIAGNOSIS — H401133 Primary open-angle glaucoma, bilateral, severe stage: Secondary | ICD-10-CM | POA: Diagnosis not present

## 2024-01-31 DIAGNOSIS — I132 Hypertensive heart and chronic kidney disease with heart failure and with stage 5 chronic kidney disease, or end stage renal disease: Secondary | ICD-10-CM | POA: Diagnosis present

## 2024-01-31 DIAGNOSIS — Y92129 Unspecified place in nursing home as the place of occurrence of the external cause: Secondary | ICD-10-CM | POA: Diagnosis not present

## 2024-01-31 DIAGNOSIS — R2689 Other abnormalities of gait and mobility: Secondary | ICD-10-CM | POA: Diagnosis not present

## 2024-01-31 DIAGNOSIS — E785 Hyperlipidemia, unspecified: Secondary | ICD-10-CM | POA: Diagnosis present

## 2024-01-31 DIAGNOSIS — I959 Hypotension, unspecified: Secondary | ICD-10-CM | POA: Diagnosis not present

## 2024-01-31 DIAGNOSIS — Z95810 Presence of automatic (implantable) cardiac defibrillator: Secondary | ICD-10-CM | POA: Diagnosis not present

## 2024-01-31 DIAGNOSIS — I2510 Atherosclerotic heart disease of native coronary artery without angina pectoris: Secondary | ICD-10-CM | POA: Diagnosis present

## 2024-01-31 DIAGNOSIS — N281 Cyst of kidney, acquired: Secondary | ICD-10-CM | POA: Diagnosis not present

## 2024-01-31 DIAGNOSIS — M549 Dorsalgia, unspecified: Secondary | ICD-10-CM | POA: Diagnosis not present

## 2024-01-31 DIAGNOSIS — G9340 Encephalopathy, unspecified: Secondary | ICD-10-CM | POA: Diagnosis not present

## 2024-01-31 DIAGNOSIS — Z79899 Other long term (current) drug therapy: Secondary | ICD-10-CM | POA: Diagnosis not present

## 2024-01-31 DIAGNOSIS — I6529 Occlusion and stenosis of unspecified carotid artery: Secondary | ICD-10-CM | POA: Diagnosis not present

## 2024-01-31 DIAGNOSIS — S0003XA Contusion of scalp, initial encounter: Secondary | ICD-10-CM | POA: Diagnosis present

## 2024-01-31 DIAGNOSIS — M545 Low back pain, unspecified: Secondary | ICD-10-CM | POA: Diagnosis not present

## 2024-01-31 DIAGNOSIS — E8809 Other disorders of plasma-protein metabolism, not elsewhere classified: Secondary | ICD-10-CM | POA: Diagnosis not present

## 2024-01-31 DIAGNOSIS — Z8249 Family history of ischemic heart disease and other diseases of the circulatory system: Secondary | ICD-10-CM | POA: Diagnosis not present

## 2024-01-31 DIAGNOSIS — E669 Obesity, unspecified: Secondary | ICD-10-CM | POA: Diagnosis present

## 2024-01-31 DIAGNOSIS — I482 Chronic atrial fibrillation, unspecified: Secondary | ICD-10-CM | POA: Diagnosis not present

## 2024-01-31 DIAGNOSIS — N185 Chronic kidney disease, stage 5: Secondary | ICD-10-CM | POA: Diagnosis present

## 2024-01-31 DIAGNOSIS — S2242XA Multiple fractures of ribs, left side, initial encounter for closed fracture: Secondary | ICD-10-CM | POA: Diagnosis present

## 2024-01-31 DIAGNOSIS — R9082 White matter disease, unspecified: Secondary | ICD-10-CM | POA: Diagnosis not present

## 2024-01-31 DIAGNOSIS — K219 Gastro-esophageal reflux disease without esophagitis: Secondary | ICD-10-CM | POA: Diagnosis present

## 2024-01-31 DIAGNOSIS — N184 Chronic kidney disease, stage 4 (severe): Secondary | ICD-10-CM | POA: Diagnosis not present

## 2024-01-31 DIAGNOSIS — I10 Essential (primary) hypertension: Secondary | ICD-10-CM | POA: Diagnosis not present

## 2024-01-31 DIAGNOSIS — G4733 Obstructive sleep apnea (adult) (pediatric): Secondary | ICD-10-CM | POA: Diagnosis present

## 2024-01-31 DIAGNOSIS — M4726 Other spondylosis with radiculopathy, lumbar region: Secondary | ICD-10-CM | POA: Diagnosis not present

## 2024-01-31 DIAGNOSIS — Z7409 Other reduced mobility: Secondary | ICD-10-CM | POA: Diagnosis not present

## 2024-01-31 DIAGNOSIS — G4089 Other seizures: Secondary | ICD-10-CM | POA: Diagnosis not present

## 2024-01-31 DIAGNOSIS — Z79891 Long term (current) use of opiate analgesic: Secondary | ICD-10-CM | POA: Diagnosis not present

## 2024-01-31 DIAGNOSIS — Z743 Need for continuous supervision: Secondary | ICD-10-CM | POA: Diagnosis not present

## 2024-01-31 DIAGNOSIS — E162 Hypoglycemia, unspecified: Secondary | ICD-10-CM | POA: Diagnosis present

## 2024-01-31 DIAGNOSIS — I5042 Chronic combined systolic (congestive) and diastolic (congestive) heart failure: Secondary | ICD-10-CM | POA: Diagnosis present

## 2024-01-31 DIAGNOSIS — Z91199 Patient's noncompliance with other medical treatment and regimen due to unspecified reason: Secondary | ICD-10-CM | POA: Diagnosis not present

## 2024-01-31 DIAGNOSIS — G8929 Other chronic pain: Secondary | ICD-10-CM | POA: Diagnosis present

## 2024-01-31 DIAGNOSIS — K5900 Constipation, unspecified: Secondary | ICD-10-CM | POA: Diagnosis not present

## 2024-01-31 DIAGNOSIS — I5041 Acute combined systolic (congestive) and diastolic (congestive) heart failure: Secondary | ICD-10-CM | POA: Diagnosis not present

## 2024-01-31 DIAGNOSIS — Z794 Long term (current) use of insulin: Secondary | ICD-10-CM | POA: Diagnosis not present

## 2024-01-31 DIAGNOSIS — M6281 Muscle weakness (generalized): Secondary | ICD-10-CM | POA: Diagnosis not present

## 2024-01-31 DIAGNOSIS — F32A Depression, unspecified: Secondary | ICD-10-CM | POA: Diagnosis present

## 2024-01-31 DIAGNOSIS — R531 Weakness: Secondary | ICD-10-CM | POA: Diagnosis not present

## 2024-01-31 DIAGNOSIS — N179 Acute kidney failure, unspecified: Secondary | ICD-10-CM | POA: Diagnosis not present

## 2024-01-31 DIAGNOSIS — Z823 Family history of stroke: Secondary | ICD-10-CM | POA: Diagnosis not present

## 2024-01-31 DIAGNOSIS — M4312 Spondylolisthesis, cervical region: Secondary | ICD-10-CM | POA: Diagnosis not present

## 2024-01-31 DIAGNOSIS — S2232XA Fracture of one rib, left side, initial encounter for closed fracture: Secondary | ICD-10-CM | POA: Diagnosis not present

## 2024-01-31 DIAGNOSIS — E876 Hypokalemia: Secondary | ICD-10-CM | POA: Diagnosis not present

## 2024-01-31 DIAGNOSIS — E1142 Type 2 diabetes mellitus with diabetic polyneuropathy: Secondary | ICD-10-CM | POA: Diagnosis not present

## 2024-01-31 DIAGNOSIS — G9389 Other specified disorders of brain: Secondary | ICD-10-CM | POA: Diagnosis not present

## 2024-01-31 DIAGNOSIS — E11649 Type 2 diabetes mellitus with hypoglycemia without coma: Secondary | ICD-10-CM | POA: Diagnosis present

## 2024-01-31 DIAGNOSIS — E113293 Type 2 diabetes mellitus with mild nonproliferative diabetic retinopathy without macular edema, bilateral: Secondary | ICD-10-CM | POA: Diagnosis not present

## 2024-01-31 DIAGNOSIS — N400 Enlarged prostate without lower urinary tract symptoms: Secondary | ICD-10-CM | POA: Diagnosis present

## 2024-01-31 DIAGNOSIS — M48061 Spinal stenosis, lumbar region without neurogenic claudication: Secondary | ICD-10-CM | POA: Diagnosis not present

## 2024-01-31 DIAGNOSIS — F339 Major depressive disorder, recurrent, unspecified: Secondary | ICD-10-CM | POA: Diagnosis not present

## 2024-01-31 DIAGNOSIS — E1169 Type 2 diabetes mellitus with other specified complication: Secondary | ICD-10-CM | POA: Diagnosis not present

## 2024-01-31 DIAGNOSIS — E1165 Type 2 diabetes mellitus with hyperglycemia: Secondary | ICD-10-CM | POA: Diagnosis not present

## 2024-01-31 DIAGNOSIS — E1122 Type 2 diabetes mellitus with diabetic chronic kidney disease: Secondary | ICD-10-CM | POA: Diagnosis present

## 2024-01-31 DIAGNOSIS — E1100 Type 2 diabetes mellitus with hyperosmolarity without nonketotic hyperglycemic-hyperosmolar coma (NKHHC): Secondary | ICD-10-CM | POA: Diagnosis not present

## 2024-01-31 LAB — CULTURE, BLOOD (ROUTINE X 2): Culture: NO GROWTH

## 2024-01-31 LAB — BASIC METABOLIC PANEL WITH GFR
Anion gap: 8 (ref 5–15)
BUN: 42 mg/dL — ABNORMAL HIGH (ref 8–23)
CO2: 24 mmol/L (ref 22–32)
Calcium: 8.6 mg/dL — ABNORMAL LOW (ref 8.9–10.3)
Chloride: 97 mmol/L — ABNORMAL LOW (ref 98–111)
Creatinine, Ser: 3.14 mg/dL — ABNORMAL HIGH (ref 0.61–1.24)
GFR, Estimated: 20 mL/min — ABNORMAL LOW (ref >60)
Glucose, Bld: 375 mg/dL — ABNORMAL HIGH (ref 70–99)
Potassium: 4.9 mmol/L (ref 3.5–5.1)
Sodium: 129 mmol/L — ABNORMAL LOW (ref 135–145)

## 2024-01-31 LAB — GLUCOSE, CAPILLARY
Glucose-Capillary: 361 mg/dL — ABNORMAL HIGH (ref 70–99)
Glucose-Capillary: 184 mg/dL — ABNORMAL HIGH (ref 70–99)

## 2024-01-31 MED ORDER — OXYCODONE HCL 10 MG PO TABS: 5.0000 mg | ORAL_TABLET | Freq: Three times a day (TID) | ORAL | 0 refills | Status: DC

## 2024-01-31 MED ORDER — TRESIBA FLEXTOUCH 200 UNIT/ML ~~LOC~~ SOPN: 33.0000 [IU] | PEN_INJECTOR | SUBCUTANEOUS | Status: DC

## 2024-01-31 MED ORDER — INSULIN GLARGINE 100 UNIT/ML ~~LOC~~ SOLN
3.0000 [IU] | Freq: Once | SUBCUTANEOUS | Status: AC
  Administered 2024-01-31: 3 [IU] via SUBCUTANEOUS
  Filled 2024-01-31: qty 0.03

## 2024-01-31 NOTE — TOC Transition Note (Addendum)
 Transition of Care [facility]) - Discharge Note   Patient Details  Name: Anthony [name] MRN: [id number] Date of Birth: 04-18-1951  Transition of Care [facility] [place] CM/SW Contact:  Olam FORBES Ally, LCSW Phone Number: 01/31/2024, 12:43 PM   Clinical Narrative:     Patient will DC to:?[facility] Anticipated DC date:?01/31/2024 Family notified:?Niece- [name] Transport by: ROME   Per MD patient ready for DC to [facility]?. RN, patient, patient's family, and facility notified of DC. Discharge Summary sent to facility. RN given number for report [phone]. DC packet on chart. Ambulance transport requested for patient.   CSW signing off.   Olam Ally, [place] [phone]       Barriers to Discharge: No Barriers Identified   Patient Goals and CMS Choice Patient states their goals for this hospitalization and ongoing recovery are:: return home   Choice offered to / list presented to : NA      Discharge Placement              Patient chooses bed at: [facility] Patient to be transferred to facility by: [facility] Name of family member notified: Niece [name] Patient and family notified of of transfer: 01/31/24  Discharge Plan and Services Additional resources added to the After Visit Summary for     Discharge Planning Services: CM Consult, Medication Assistance Post Acute Care Choice: NA            DME Agency: NA                  Social Drivers of Health (SDOH) Interventions SDOH Screenings   Food Insecurity: No Food Insecurity (01/26/2024)  Housing: Low Risk  (01/26/2024)  Transportation Needs: No Transportation Needs (01/26/2024)  Utilities: Not At Risk (01/26/2024)  Alcohol Screen: Low Risk  (08/21/2023)  Depression (PHQ2-9): Low Risk  (10/29/2023)  Financial Resource Strain: Low Risk  (08/21/2023)  Physical Activity: Sufficiently Active (08/21/2023)  Social Connections: Socially Integrated (01/26/2024)  Stress: No Stress Concern Present (08/21/2023)   Tobacco Use: Medium Risk (01/26/2024)  Health Literacy: Adequate Health Literacy (08/21/2023)     Readmission Risk Interventions     No data to display

## 2024-01-31 NOTE — Progress Notes (Signed)
 AVS in packet with signed scripts and transport paperwork

## 2024-01-31 NOTE — Progress Notes (Signed)
 Mobility Specialist Progress Note:   01/31/24 1300  Mobility  Activity Pivoted/transferred from bed to chair  Level of Assistance Minimal assist, patient does 75% or more  Assistive Device Front wheel walker  Distance Ambulated (ft) 3 ft  Activity Response Tolerated well  Mobility Referral Yes  Mobility visit 1 Mobility  Mobility Specialist Start Time (ACUTE ONLY) 1246  Mobility Specialist Stop Time (ACUTE ONLY) 1256  Mobility Specialist Time Calculation (min) (ACUTE ONLY) 10 min   Pt received in bed, agreeable to transfer B>C for lunch. Required MinA to stand and pivot. Pt had one bout of LOB d/t posterior lean. ModA to correct. Pt sitting up in chair with lunch and all needs met, call bell in reach. Nsg notified.    Makailey Hodgkin Mobility Specialist Please contact via Special educational needs teacher or  Rehab office at [phone]

## 2024-01-31 NOTE — Progress Notes (Signed)
 Patient is ready to d/c to [facility], V/S are stable, AVS papers and medications script placed in d/c packet, PIV removed, report given to the RN of [facility], patient's niece made aware by SW, his belongings will be send with [facility], waiting for transport.

## 2024-02-01 DIAGNOSIS — I482 Chronic atrial fibrillation, unspecified: Secondary | ICD-10-CM | POA: Diagnosis not present

## 2024-02-01 DIAGNOSIS — M549 Dorsalgia, unspecified: Secondary | ICD-10-CM | POA: Diagnosis not present

## 2024-02-01 DIAGNOSIS — N184 Chronic kidney disease, stage 4 (severe): Secondary | ICD-10-CM | POA: Diagnosis not present

## 2024-02-01 DIAGNOSIS — E876 Hypokalemia: Secondary | ICD-10-CM | POA: Diagnosis not present

## 2024-02-01 DIAGNOSIS — E1159 Type 2 diabetes mellitus with other circulatory complications: Secondary | ICD-10-CM | POA: Diagnosis not present

## 2024-02-01 DIAGNOSIS — I152 Hypertension secondary to endocrine disorders: Secondary | ICD-10-CM | POA: Diagnosis not present

## 2024-02-01 DIAGNOSIS — M48061 Spinal stenosis, lumbar region without neurogenic claudication: Secondary | ICD-10-CM | POA: Diagnosis not present

## 2024-02-01 DIAGNOSIS — I5041 Acute combined systolic (congestive) and diastolic (congestive) heart failure: Secondary | ICD-10-CM | POA: Diagnosis not present

## 2024-02-01 DIAGNOSIS — I959 Hypotension, unspecified: Secondary | ICD-10-CM | POA: Diagnosis not present

## 2024-02-01 DIAGNOSIS — E1100 Type 2 diabetes mellitus with hyperosmolarity without nonketotic hyperglycemic-hyperosmolar coma (NKHHC): Secondary | ICD-10-CM | POA: Diagnosis not present

## 2024-02-01 DIAGNOSIS — N179 Acute kidney failure, unspecified: Secondary | ICD-10-CM | POA: Diagnosis not present

## 2024-02-01 DIAGNOSIS — G8929 Other chronic pain: Secondary | ICD-10-CM | POA: Diagnosis not present

## 2024-02-01 LAB — CULTURE, BLOOD (ROUTINE X 2): Special Requests: ADEQUATE

## 2024-02-02 DIAGNOSIS — M549 Dorsalgia, unspecified: Secondary | ICD-10-CM | POA: Diagnosis not present

## 2024-02-02 DIAGNOSIS — R531 Weakness: Secondary | ICD-10-CM | POA: Diagnosis not present

## 2024-02-02 DIAGNOSIS — G8929 Other chronic pain: Secondary | ICD-10-CM | POA: Diagnosis not present

## 2024-02-02 DIAGNOSIS — Z7409 Other reduced mobility: Secondary | ICD-10-CM | POA: Diagnosis not present

## 2024-02-02 DIAGNOSIS — W19XXXD Unspecified fall, subsequent encounter: Secondary | ICD-10-CM | POA: Diagnosis not present

## 2024-02-02 LAB — GLUCOSE, CAPILLARY: Glucose-Capillary: 79 mg/dL (ref 70–99)

## 2024-02-03 DIAGNOSIS — M549 Dorsalgia, unspecified: Secondary | ICD-10-CM | POA: Diagnosis not present

## 2024-02-03 DIAGNOSIS — E1165 Type 2 diabetes mellitus with hyperglycemia: Secondary | ICD-10-CM | POA: Diagnosis not present

## 2024-02-03 DIAGNOSIS — M48061 Spinal stenosis, lumbar region without neurogenic claudication: Secondary | ICD-10-CM | POA: Diagnosis not present

## 2024-02-03 DIAGNOSIS — Z794 Long term (current) use of insulin: Secondary | ICD-10-CM | POA: Diagnosis not present

## 2024-02-03 DIAGNOSIS — R531 Weakness: Secondary | ICD-10-CM | POA: Diagnosis not present

## 2024-02-03 DIAGNOSIS — Z79891 Long term (current) use of opiate analgesic: Secondary | ICD-10-CM | POA: Diagnosis not present

## 2024-02-03 DIAGNOSIS — G8929 Other chronic pain: Secondary | ICD-10-CM | POA: Diagnosis not present

## 2024-02-03 DIAGNOSIS — Z7409 Other reduced mobility: Secondary | ICD-10-CM | POA: Diagnosis not present

## 2024-02-04 DIAGNOSIS — E1165 Type 2 diabetes mellitus with hyperglycemia: Secondary | ICD-10-CM | POA: Diagnosis not present

## 2024-02-04 DIAGNOSIS — N179 Acute kidney failure, unspecified: Secondary | ICD-10-CM | POA: Diagnosis not present

## 2024-02-04 DIAGNOSIS — M549 Dorsalgia, unspecified: Secondary | ICD-10-CM | POA: Diagnosis not present

## 2024-02-04 DIAGNOSIS — Z91199 Patient's noncompliance with other medical treatment and regimen due to unspecified reason: Secondary | ICD-10-CM | POA: Diagnosis not present

## 2024-02-04 DIAGNOSIS — E1101 Type 2 diabetes mellitus with hyperosmolarity with coma: Secondary | ICD-10-CM | POA: Diagnosis not present

## 2024-02-04 DIAGNOSIS — I482 Chronic atrial fibrillation, unspecified: Secondary | ICD-10-CM | POA: Diagnosis not present

## 2024-02-05 DIAGNOSIS — R531 Weakness: Secondary | ICD-10-CM | POA: Diagnosis not present

## 2024-02-05 DIAGNOSIS — Z7409 Other reduced mobility: Secondary | ICD-10-CM | POA: Diagnosis not present

## 2024-02-05 DIAGNOSIS — K5900 Constipation, unspecified: Secondary | ICD-10-CM | POA: Diagnosis not present

## 2024-02-05 DIAGNOSIS — Z79891 Long term (current) use of opiate analgesic: Secondary | ICD-10-CM | POA: Diagnosis not present

## 2024-02-06 ENCOUNTER — Emergency Department (HOSPITAL_COMMUNITY)

## 2024-02-06 ENCOUNTER — Inpatient Hospital Stay (HOSPITAL_COMMUNITY)
Admission: EM | Admit: 2024-02-06 | Discharge: 2024-02-09 | DRG: 637 | Disposition: A | Discharge status: Home Health | Source: Skilled Nursing Facility | Attending: Family Medicine | Admitting: Family Medicine

## 2024-02-06 ENCOUNTER — Other Ambulatory Visit: Payer: Self-pay

## 2024-02-06 ENCOUNTER — Encounter (HOSPITAL_COMMUNITY): Payer: Self-pay | Admitting: Family Medicine

## 2024-02-06 DIAGNOSIS — I5042 Chronic combined systolic (congestive) and diastolic (congestive) heart failure: Secondary | ICD-10-CM | POA: Diagnosis not present

## 2024-02-06 DIAGNOSIS — E785 Hyperlipidemia, unspecified: Secondary | ICD-10-CM | POA: Diagnosis not present

## 2024-02-06 DIAGNOSIS — Z7901 Long term (current) use of anticoagulants: Secondary | ICD-10-CM

## 2024-02-06 DIAGNOSIS — G9389 Other specified disorders of brain: Secondary | ICD-10-CM | POA: Diagnosis not present

## 2024-02-06 DIAGNOSIS — Z87891 Personal history of nicotine dependence: Secondary | ICD-10-CM | POA: Diagnosis not present

## 2024-02-06 DIAGNOSIS — I10 Essential (primary) hypertension: Secondary | ICD-10-CM | POA: Diagnosis not present

## 2024-02-06 DIAGNOSIS — G4733 Obstructive sleep apnea (adult) (pediatric): Secondary | ICD-10-CM | POA: Diagnosis present

## 2024-02-06 DIAGNOSIS — I132 Hypertensive heart and chronic kidney disease with heart failure and with stage 5 chronic kidney disease, or end stage renal disease: Secondary | ICD-10-CM | POA: Diagnosis present

## 2024-02-06 DIAGNOSIS — S2242XA Multiple fractures of ribs, left side, initial encounter for closed fracture: Secondary | ICD-10-CM | POA: Diagnosis not present

## 2024-02-06 DIAGNOSIS — E11649 Type 2 diabetes mellitus with hypoglycemia without coma: Secondary | ICD-10-CM | POA: Diagnosis not present

## 2024-02-06 DIAGNOSIS — E1100 Type 2 diabetes mellitus with hyperosmolarity without nonketotic hyperglycemic-hyperosmolar coma (NKHHC): Secondary | ICD-10-CM

## 2024-02-06 DIAGNOSIS — N281 Cyst of kidney, acquired: Secondary | ICD-10-CM | POA: Diagnosis not present

## 2024-02-06 DIAGNOSIS — E1122 Type 2 diabetes mellitus with diabetic chronic kidney disease: Secondary | ICD-10-CM | POA: Diagnosis not present

## 2024-02-06 DIAGNOSIS — Z833 Family history of diabetes mellitus: Secondary | ICD-10-CM | POA: Diagnosis not present

## 2024-02-06 DIAGNOSIS — K5900 Constipation, unspecified: Secondary | ICD-10-CM | POA: Diagnosis present

## 2024-02-06 DIAGNOSIS — R9082 White matter disease, unspecified: Secondary | ICD-10-CM | POA: Diagnosis not present

## 2024-02-06 DIAGNOSIS — I2510 Atherosclerotic heart disease of native coronary artery without angina pectoris: Secondary | ICD-10-CM | POA: Diagnosis not present

## 2024-02-06 DIAGNOSIS — G9340 Encephalopathy, unspecified: Secondary | ICD-10-CM | POA: Diagnosis not present

## 2024-02-06 DIAGNOSIS — N400 Enlarged prostate without lower urinary tract symptoms: Secondary | ICD-10-CM | POA: Diagnosis present

## 2024-02-06 DIAGNOSIS — G9341 Metabolic encephalopathy: Secondary | ICD-10-CM | POA: Diagnosis not present

## 2024-02-06 DIAGNOSIS — E162 Hypoglycemia, unspecified: Secondary | ICD-10-CM | POA: Diagnosis present

## 2024-02-06 DIAGNOSIS — Z794 Long term (current) use of insulin: Secondary | ICD-10-CM | POA: Diagnosis not present

## 2024-02-06 DIAGNOSIS — S2232XA Fracture of one rib, left side, initial encounter for closed fracture: Secondary | ICD-10-CM | POA: Diagnosis not present

## 2024-02-06 DIAGNOSIS — F32A Depression, unspecified: Secondary | ICD-10-CM | POA: Diagnosis present

## 2024-02-06 DIAGNOSIS — Z95810 Presence of automatic (implantable) cardiac defibrillator: Secondary | ICD-10-CM | POA: Diagnosis not present

## 2024-02-06 DIAGNOSIS — S0003XA Contusion of scalp, initial encounter: Secondary | ICD-10-CM | POA: Diagnosis present

## 2024-02-06 DIAGNOSIS — Y92129 Unspecified place in nursing home as the place of occurrence of the external cause: Secondary | ICD-10-CM | POA: Diagnosis not present

## 2024-02-06 DIAGNOSIS — R55 Syncope and collapse: Secondary | ICD-10-CM | POA: Insufficient documentation

## 2024-02-06 DIAGNOSIS — Z79899 Other long term (current) drug therapy: Secondary | ICD-10-CM

## 2024-02-06 DIAGNOSIS — E669 Obesity, unspecified: Secondary | ICD-10-CM | POA: Diagnosis present

## 2024-02-06 DIAGNOSIS — E119 Type 2 diabetes mellitus without complications: Secondary | ICD-10-CM

## 2024-02-06 DIAGNOSIS — R4182 Altered mental status, unspecified: Secondary | ICD-10-CM | POA: Diagnosis present

## 2024-02-06 DIAGNOSIS — Z8249 Family history of ischemic heart disease and other diseases of the circulatory system: Secondary | ICD-10-CM | POA: Diagnosis not present

## 2024-02-06 DIAGNOSIS — Z823 Family history of stroke: Secondary | ICD-10-CM | POA: Diagnosis not present

## 2024-02-06 DIAGNOSIS — N185 Chronic kidney disease, stage 5: Secondary | ICD-10-CM | POA: Diagnosis not present

## 2024-02-06 DIAGNOSIS — I6529 Occlusion and stenosis of unspecified carotid artery: Secondary | ICD-10-CM | POA: Diagnosis not present

## 2024-02-06 DIAGNOSIS — K219 Gastro-esophageal reflux disease without esophagitis: Secondary | ICD-10-CM | POA: Diagnosis not present

## 2024-02-06 DIAGNOSIS — W19XXXA Unspecified fall, initial encounter: Secondary | ICD-10-CM | POA: Diagnosis present

## 2024-02-06 DIAGNOSIS — S2249XA Multiple fractures of ribs, unspecified side, initial encounter for closed fracture: Secondary | ICD-10-CM | POA: Insufficient documentation

## 2024-02-06 DIAGNOSIS — Z888 Allergy status to other drugs, medicaments and biological substances status: Secondary | ICD-10-CM

## 2024-02-06 DIAGNOSIS — M4312 Spondylolisthesis, cervical region: Secondary | ICD-10-CM | POA: Diagnosis not present

## 2024-02-06 DIAGNOSIS — Z950 Presence of cardiac pacemaker: Secondary | ICD-10-CM | POA: Diagnosis not present

## 2024-02-06 DIAGNOSIS — G8929 Other chronic pain: Secondary | ICD-10-CM | POA: Diagnosis not present

## 2024-02-06 DIAGNOSIS — Z789 Other specified health status: Secondary | ICD-10-CM

## 2024-02-06 LAB — URINALYSIS, ROUTINE W REFLEX MICROSCOPIC
Bilirubin Urine: NEGATIVE
Glucose, UA: NEGATIVE mg/dL
Hgb urine dipstick: NEGATIVE
Ketones, ur: NEGATIVE mg/dL
Leukocytes,Ua: NEGATIVE
Nitrite: NEGATIVE
Protein, ur: 30 mg/dL — AB
Specific Gravity, Urine: 1.006 (ref 1.005–1.030)
pH: 6 (ref 5.0–8.0)

## 2024-02-06 LAB — CBC
HCT: 37.6 % — ABNORMAL LOW (ref 39.0–52.0)
Hemoglobin: 11.9 g/dL — ABNORMAL LOW (ref 13.0–17.0)
MCH: 29.5 pg (ref 26.0–34.0)
MCHC: 31.6 g/dL (ref 30.0–36.0)
MCV: 93.3 fL (ref 80.0–100.0)
Platelets: 190 K/uL (ref 150–400)
RBC: 4.03 MIL/uL — ABNORMAL LOW (ref 4.22–5.81)
RDW: 14.2 % (ref 11.5–15.5)
WBC: 9.1 K/uL (ref 4.0–10.5)
nRBC: 0 % (ref 0.0–0.2)

## 2024-02-06 LAB — I-STAT CHEM 8, ED
BUN: 30 mg/dL — ABNORMAL HIGH (ref 8–23)
Calcium, Ion: 1.15 mmol/L (ref 1.15–1.40)
Chloride: 105 mmol/L (ref 98–111)
Creatinine, Ser: 2.8 mg/dL — ABNORMAL HIGH (ref 0.61–1.24)
Glucose, Bld: 35 mg/dL — CL (ref 70–99)
HCT: 35 % — ABNORMAL LOW (ref 39.0–52.0)
Hemoglobin: 11.9 g/dL — ABNORMAL LOW (ref 13.0–17.0)
Potassium: 4.3 mmol/L (ref 3.5–5.1)
Sodium: 137 mmol/L (ref 135–145)
TCO2: 22 mmol/L (ref 22–32)

## 2024-02-06 LAB — ETHANOL: Alcohol, Ethyl (B): <15 mg/dL (ref <15)

## 2024-02-06 LAB — COMPREHENSIVE METABOLIC PANEL WITH GFR
ALT: 21 U/L (ref 0–44)
AST: 34 U/L (ref 15–41)
Albumin: 2.5 g/dL — ABNORMAL LOW (ref 3.5–5.0)
Alkaline Phosphatase: 92 U/L (ref 38–126)
Anion gap: 11 (ref 5–15)
BUN: 31 mg/dL — ABNORMAL HIGH (ref 8–23)
CO2: 21 mmol/L — ABNORMAL LOW (ref 22–32)
Calcium: 9 mg/dL (ref 8.9–10.3)
Chloride: 103 mmol/L (ref 98–111)
Creatinine, Ser: 2.55 mg/dL — ABNORMAL HIGH (ref 0.61–1.24)
GFR, Estimated: 26 mL/min — ABNORMAL LOW (ref >60)
Glucose, Bld: 38 mg/dL — CL (ref 70–99)
Potassium: 4.4 mmol/L (ref 3.5–5.1)
Sodium: 135 mmol/L (ref 135–145)
Total Bilirubin: 0.7 mg/dL (ref 0.0–1.2)
Total Protein: 6.4 g/dL — ABNORMAL LOW (ref 6.5–8.1)

## 2024-02-06 LAB — CBG MONITORING, ED
Glucose-Capillary: 28 mg/dL — CL (ref 70–99)
Glucose-Capillary: 85 mg/dL (ref 70–99)
Glucose-Capillary: 96 mg/dL (ref 70–99)
Glucose-Capillary: 113 mg/dL — ABNORMAL HIGH (ref 70–99)

## 2024-02-06 LAB — SAMPLE TO BLOOD BANK

## 2024-02-06 LAB — CK: Total CK: 104 U/L (ref 49–397)

## 2024-02-06 LAB — PROTIME-INR
INR: 1.3 — ABNORMAL HIGH (ref 0.8–1.2)
Prothrombin Time: 16.7 s — ABNORMAL HIGH (ref 11.4–15.2)

## 2024-02-06 LAB — GLUCOSE, CAPILLARY
Glucose-Capillary: 100 mg/dL — ABNORMAL HIGH (ref 70–99)
Glucose-Capillary: 153 mg/dL — ABNORMAL HIGH (ref 70–99)
Glucose-Capillary: 206 mg/dL — ABNORMAL HIGH (ref 70–99)

## 2024-02-06 LAB — I-STAT CG4 LACTIC ACID, ED: Lactic Acid, Venous: 1.4 mmol/L (ref 0.5–1.9)

## 2024-02-06 MED ORDER — SENNA 8.6 MG PO TABS
1.0000 | ORAL_TABLET | Freq: Every day | ORAL | Status: DC
  Administered 2024-02-06 – 2024-02-08 (×3): 8.6 mg via ORAL
  Filled 2024-02-06 (×4): qty 1

## 2024-02-06 MED ORDER — POLYETHYLENE GLYCOL 3350 17 G PO PACK
17.0000 g | PACK | Freq: Once | ORAL | Status: AC
  Administered 2024-02-06: 17 g via ORAL
  Filled 2024-02-06: qty 1

## 2024-02-06 MED ORDER — TAMSULOSIN HCL 0.4 MG PO CAPS
0.4000 mg | ORAL_CAPSULE | Freq: Every day | ORAL | Status: DC
  Administered 2024-02-06 – 2024-02-09 (×4): 0.4 mg via ORAL
  Filled 2024-02-06 (×4): qty 1

## 2024-02-06 MED ORDER — METOPROLOL TARTRATE 12.5 MG HALF TABLET
12.5000 mg | ORAL_TABLET | Freq: Two times a day (BID) | ORAL | Status: DC
  Administered 2024-02-06 – 2024-02-09 (×7): 12.5 mg via ORAL
  Filled 2024-02-06 (×7): qty 1

## 2024-02-06 MED ORDER — DORZOLAMIDE HCL-TIMOLOL MAL 2-0.5 % OP SOLN
1.0000 [drp] | Freq: Two times a day (BID) | OPHTHALMIC | Status: DC
  Administered 2024-02-06 – 2024-02-09 (×6): 1 [drp] via OPHTHALMIC
  Filled 2024-02-06: qty 10

## 2024-02-06 MED ORDER — GABAPENTIN 100 MG PO CAPS
200.0000 mg | ORAL_CAPSULE | Freq: Three times a day (TID) | ORAL | Status: DC
  Administered 2024-02-06 – 2024-02-09 (×9): 200 mg via ORAL
  Filled 2024-02-06 (×9): qty 2

## 2024-02-06 MED ORDER — APIXABAN 5 MG PO TABS
5.0000 mg | ORAL_TABLET | Freq: Two times a day (BID) | ORAL | Status: DC
  Administered 2024-02-06 – 2024-02-09 (×7): 5 mg via ORAL
  Filled 2024-02-06 (×7): qty 1

## 2024-02-06 MED ORDER — ATORVASTATIN CALCIUM 40 MG PO TABS
40.0000 mg | ORAL_TABLET | Freq: Every day | ORAL | Status: DC
  Administered 2024-02-06 – 2024-02-09 (×4): 40 mg via ORAL
  Filled 2024-02-06 (×4): qty 1

## 2024-02-06 MED ORDER — PANTOPRAZOLE SODIUM 40 MG PO TBEC
40.0000 mg | DELAYED_RELEASE_TABLET | Freq: Every day | ORAL | Status: DC
  Administered 2024-02-07 – 2024-02-09 (×3): 40 mg via ORAL
  Filled 2024-02-06 (×3): qty 1

## 2024-02-06 MED ORDER — DEXTROSE 50 % IV SOLN
1.0000 | Freq: Once | INTRAVENOUS | Status: AC
  Administered 2024-02-06: 50 mL via INTRAVENOUS

## 2024-02-06 MED ORDER — FUROSEMIDE 40 MG PO TABS
60.0000 mg | ORAL_TABLET | Freq: Every day | ORAL | Status: DC
  Administered 2024-02-07 – 2024-02-09 (×3): 60 mg via ORAL
  Filled 2024-02-06 (×3): qty 1

## 2024-02-06 MED ORDER — ENSURE PLUS HIGH PROTEIN PO LIQD
237.0000 mL | Freq: Two times a day (BID) | ORAL | Status: DC
  Administered 2024-02-07 – 2024-02-09 (×6): 237 mL via ORAL

## 2024-02-06 MED ORDER — ACETAMINOPHEN 500 MG PO TABS
1000.0000 mg | ORAL_TABLET | Freq: Four times a day (QID) | ORAL | Status: DC | PRN
  Administered 2024-02-06 – 2024-02-07 (×2): 1000 mg via ORAL
  Filled 2024-02-06 (×2): qty 2

## 2024-02-06 MED ORDER — PAROXETINE HCL 30 MG PO TABS
30.0000 mg | ORAL_TABLET | Freq: Every day | ORAL | Status: DC
  Administered 2024-02-06 – 2024-02-09 (×4): 30 mg via ORAL
  Filled 2024-02-06 (×5): qty 1

## 2024-02-06 MED ORDER — DEXTROSE 50 % IV SOLN
INTRAVENOUS | Status: AC
  Filled 2024-02-06: qty 50

## 2024-02-06 MED ORDER — LATANOPROST 0.005 % OP SOLN
1.0000 [drp] | Freq: Every day | OPHTHALMIC | Status: DC
  Administered 2024-02-06 – 2024-02-08 (×3): 1 [drp] via OPHTHALMIC
  Filled 2024-02-06: qty 2.5

## 2024-02-06 NOTE — Assessment & Plan Note (Signed)
 Hypoglycemia resolved. CBGs stabilized, space out to 3 times daily and nightly monitoring.  Will add back very sensitive sliding scale given elevation >200. - vsSSI plus CBGs - Consider adding back home LAI if persistently elevated

## 2024-02-06 NOTE — Evaluation (Signed)
 Occupational Therapy Evaluation Patient Details Name: Anthony [name] MRN: [id number] DOB: 02/09/1951 Today's Date: 02/06/2024   History of Present Illness   73 y.o. male history of coronary artery disease, atrial fibrillation on Eliquis , CKD, diabetes, hypertension, hyperlipidemia, seizures presenting to the emergency department with fall/syncope due to hypoglycemia.  CT Head: Right scalp hematoma without underlying skull fracture.  2. No acute intracranial abnormality. Stable chronic left MCA  territory infarct.  L 9-11 rib fractures.     Clinical Impressions Patient admitted for the diagnosis above.  PTA he was recently undergoing short term rehab at a local SNF post hospitalization.  Patient overall needing Mod A for initial sit to stand, then closer to Min A for in room mobility/toileting at RW level.  Mod A for lower body ADL, patient needing assist to maintain stand balance.  OT will continue efforts in the acute setting to address deficits, and Patient will benefit from continued inpatient follow up therapy, <3 hours/day.     If plan is discharge home, recommend the following:   A lot of help with walking and/or transfers;A lot of help with bathing/dressing/bathroom;Supervision due to cognitive status     Functional Status Assessment   Patient has had a recent decline in their functional status and demonstrates the ability to make significant improvements in function in a reasonable and predictable amount of time.     Equipment Recommendations   None recommended by OT     Recommendations for Other Services         Precautions/Restrictions   Precautions Precautions: Fall Recall of Precautions/Restrictions: Impaired Restrictions Weight Bearing Restrictions Per Provider Order: No     Mobility Bed Mobility Overal bed mobility: Needs Assistance Bed Mobility: Supine to Sit, Sit to Supine     Supine to sit: Contact guard Sit to supine: Contact guard assist         Transfers Overall transfer level: Needs assistance Equipment used: Rolling walker (2 wheels) Transfers: Sit to/from Stand Sit to Stand: Mod assist           General transfer comment: Mod assist to stand to RW iintially, incr time to get balance. Once in standing posterior lean intially needing min assist to right pt.      Balance Overall balance assessment: Needs assistance Sitting-balance support: No upper extremity supported, Feet supported Sitting balance-Leahy Scale: Fair     Standing balance support: Bilateral upper extremity supported, Reliant on assistive device for balance Standing balance-Leahy Scale: Poor                             ADL either performed or assessed with clinical judgement   ADL   Eating/Feeding: Set up;Sitting   Grooming: Set up;Sitting   Upper Body Bathing: Set up;Sitting   Lower Body Bathing: Moderate assistance;Sit to/from stand Lower Body Bathing Details (indicate cue type and reason): Mod assist to STS and steady in standing Upper Body Dressing : Set up;Sitting   Lower Body Dressing: Moderate assistance;Sit to/from stand Lower Body Dressing Details (indicate cue type and reason): Pt able to reach BLEs for socks, mod assist to stand and balance in standing Toilet Transfer: Minimal assistance;Rolling walker (2 wheels);Regular Toilet;Ambulation                   Vision   Vision Assessment?: No apparent visual deficits     Perception Perception: Not tested       Praxis Praxis: Not tested  Pertinent Vitals/Pain Pain Assessment Pain Assessment: 0-10 Pain Score: 3  Pain Location: back Pain Descriptors / Indicators: Aching Pain Intervention(s): Monitored during session     Extremity/Trunk Assessment Upper Extremity Assessment Upper Extremity Assessment: Generalized weakness;LUE deficits/detail;Right hand dominant LUE Deficits / Details: pt reports hx of poor AROM, shoulder flex ~70 degreees. bil  ataxic jerks observed LUE Sensation: decreased light touch LUE Coordination: decreased gross motor;decreased fine motor   Lower Extremity Assessment Lower Extremity Assessment: Defer to PT evaluation   Cervical / Trunk Assessment Cervical / Trunk Assessment: Kyphotic   Communication Communication Communication: No apparent difficulties   Cognition Arousal: Alert Behavior During Therapy: WFL for tasks assessed/performed Cognition: Cognition impaired       Memory impairment (select all impairments): Short-term memory, Working memory Attention impairment (select first level of impairment): Sustained attention                     Following commands: Impaired Following commands impaired: Follows one step commands with increased time     Cueing  General Comments   Cueing Techniques: Verbal cues;Visual cues   VSS on RA   Exercises     Shoulder Instructions      Home Living Family/patient expects to be discharged to:: Skilled nursing facility Living Arrangements: Spouse/significant other Available Help at Discharge: Family;Available 24 hours/day Type of Home: House Home Access: Stairs to enter [employer] of Steps: 5 Entrance Stairs-Rails: Right;Left;Can reach both Home Layout: One level     Bathroom Shower/Tub: Producer, television/film/video: Standard Bathroom Accessibility: Yes How Accessible: Accessible via walker Home Equipment: Cane - single point          Prior Functioning/Environment Prior Level of Function : Independent/Modified Independent             Mobility Comments: pt reports primarily using SPC for mobility ADLs Comments: pt reports mod I    OT Problem List: Decreased strength;Decreased range of motion;Decreased activity tolerance;Impaired balance (sitting and/or standing);Decreased safety awareness;Decreased cognition;Decreased knowledge of use of DME or AE;Cardiopulmonary status limiting activity   OT  Treatment/Interventions: Self-care/ADL training;Therapeutic exercise;Energy conservation;DME and/or AE instruction;Therapeutic activities;Patient/family education;Balance training      OT Goals(Current goals can be found in the care plan section)   Acute Rehab OT Goals Patient Stated Goal: Return home OT Goal Formulation: With patient Time For Goal Achievement: 02/20/24 Potential to Achieve Goals: Good ADL Goals Pt Will Perform Grooming: with supervision;standing Pt Will Perform Lower Body Dressing: with min assist;sit to/from stand Pt Will Transfer to Toilet: with supervision;ambulating;regular height toilet   OT Frequency:  Min 2X/week    Co-evaluation              AM-PAC OT 6 Clicks Daily Activity     Outcome Measure Help from another person eating meals?: None Help from another person taking care of personal grooming?: A Little Help from another person toileting, which includes using toliet, bedpan, or urinal?: A Lot Help from another person bathing (including washing, rinsing, drying)?: A Lot Help from another person to put on and taking off regular upper body clothing?: A Little Help from another person to put on and taking off regular lower body clothing?: A Lot 6 Click Score: 16   End of Session Equipment Utilized During Treatment: Gait belt;Rolling walker (2 wheels) Nurse Communication: Mobility status  Activity Tolerance: Patient tolerated treatment well Patient left: in bed;with call bell/phone within reach;with nursing/sitter in room  OT Visit Diagnosis: Unsteadiness on feet (R26.81);Other  abnormalities of gait and mobility (R26.89);Muscle weakness (generalized) (M62.81)                Time: 8499-8477 OT Time Calculation (min): 22 min Charges:  OT General Charges $OT Visit: 1 Visit OT Evaluation $OT Eval Moderate Complexity: 1 Mod  02/06/2024  RP, OTR/L  Acute Rehabilitation Services  Office:  [phone]   Anthony [name] 02/06/2024, 3:35  PM

## 2024-02-06 NOTE — Assessment & Plan Note (Signed)
-   Cont home metop 12.5mg  daily - At prior admission, lasix , hyzaar, and hydralazine  were held. Clarify with SNF if these were restarted.

## 2024-02-06 NOTE — Assessment & Plan Note (Signed)
 Improving.  - Fall precautions - orthostatic vitals once more stable - Holding home flomax  for potential orthostasis. Can restart if BP stable.  - No suprapubic tenderness, but can consider bladder scans if cf retention

## 2024-02-06 NOTE — Assessment & Plan Note (Addendum)
 Hypoglycemia resolved. CBGs stabilized, space out to 3 times daily and nightly monitoring.  Will add back very sensitive sliding scale given elevation >200. - vsSSI plus CBGs - Consider adding back home LAI if persistently elevated

## 2024-02-06 NOTE — Progress Notes (Signed)
 Received pt from ED, A&Ox2, unable to complete portion of admission d/t AMS.  O2 sats 70s upon checking vitals.  Placed on 2L via [place].  Pt reports no BM for several days.  MD informed. Bowel regimen ordered and administered.  Report given to Devina on 2 west.  Patient transferred to room 38 on 2 [address]. No issues noted.

## 2024-02-06 NOTE — ED Provider Notes (Signed)
 [facility] Provider Note  CSN: [id number] Arrival date & time: 02/06/24 9189  Chief Complaint(s) Fall (On eliquis  )  HPI Anthony [name] is a 73 y.o. male history of coronary artery disease, atrial fibrillation on Eliquis , CKD, diabetes, hypertension, hyperlipidemia, seizures presenting to the emergency department with fall.  Patient recently admitted for hyperglycemia and has been in rehab.  At rehab facility, the patient last seen in his chair around 330.  Nursing rounded on him again later around 4-5 and found him on the ground.  Had signs of bruising on the face.  Also was confused.  Normally he is alert and oriented x 4.  Patient denies any pain but is confused.  History limited due to confusion.   Past Medical History Past Medical History:  Diagnosis Date   Arthritis    Benign neoplasm of descending colon    Benign neoplasm of sigmoid colon    Benign neoplasm of transverse colon    CAD in native artery    a. reported MI 2000, 2001, s/p stenting of the circumflex lesion extending into the second obtuse marginal branch with a drug-eluting stent in 2003. b. low risk nuc 2015.   Chronic atrial fibrillation (HCC)    Chronic combined systolic and diastolic CHF (congestive heart failure) (HCC)    a. Previously diastolic, then EF 25-30% in 10/2016.   CKD (chronic kidney disease), stage III (HCC)    Depression    Diabetes mellitus    Edema of extremities    Erectile dysfunction    GERD (gastroesophageal reflux disease)    Hyperlipidemia    Hypertension    Myocardial infarction (HCC) 2000&2001   Obesity    Onychomycosis    OSA (obstructive sleep apnea)    S/P ICD (internal cardiac defibrillator) procedure and BiV device,  07/25/17 Medtronic  07/26/2017   Seizures (HCC)    Sleep apnea    Stroke (HCC)    Tubular adenoma of colon 10/2002   Patient Active Problem List   Diagnosis Date Noted   Hypoglycemia 02/06/2024   Scalp hematoma  02/06/2024   Rib fractures 02/06/2024   AMS (altered mental status) 01/29/2024   Chest pain at rest 01/27/2024   Type 2 diabetes mellitus with hyperosmolar hyperglycemic state (HHS) (HCC) 01/26/2024   Chronic back pain 01/26/2024   Peripheral neuropathy 10/11/2022   Urinary urgency 06/18/2022   CVA (cerebral vascular accident) (HCC) 02/20/2022   Low back pain with right-sided sciatica 01/07/2022   BPH (benign prostatic hyperplasia) 08/12/2021   Hip weakness 02/27/2021   Left leg weakness 02/17/2021   Neck mass 11/16/2020   Erectile dysfunction 05/31/2020   Polypharmacy 05/22/2020   Porokeratosis 04/26/2020   Other biomechanical lesions of lumbar region 12/07/2019   Neck muscle strain 11/22/2019   Hx of adenomatous colonic polyps    S/P ICD (internal cardiac defibrillator) Medtronic 07/26/2017   NICM (nonischemic cardiomyopathy) (HCC) 07/25/2017   Congestive heart failure, NYHA class 3, chronic, systolic (HCC) 07/25/2017   Atrial fibrillation, permanent (HCC) 07/25/2017   Memory change    Thrombocytopenia (HCC)    Right temporal lobe infarction (HCC) 06/13/2017   Seizures (HCC) 06/12/2017   Chronic kidney disease (CKD), stage IV (severe) (HCC) 01/22/2017   Secondary hyperparathyroidism of renal origin (HCC) 01/22/2017   Chronic anticoagulation 03/06/2016   Chronic, continuous use of opioids 10/18/2014   OSA on CPAP 04/04/2014   History of tobacco abuse 06/28/2013   DIABETIC  RETINOPATHY 02/24/2008   Insulin   dependent type 2 diabetes mellitus (HCC) 07/24/2006   HYPERCHOLESTEROLEMIA 07/24/2006   Depression 07/24/2006   HTN (hypertension) 07/24/2006   Coronary atherosclerosis 07/24/2006   GASTROESOPHAGEAL REFLUX, NO ESOPHAGITIS 07/24/2006   Home Medication(s) Prior to Admission medications   Medication Sig Start Date End Date Taking? Authorizing Provider  acetaminophen  (TYLENOL ) 650 MG CR tablet Take 1,300 mg by mouth 2 (two) times daily as needed for pain.   Yes [provider]  apixaban  (ELIQUIS ) 5 MG TABS tablet Take 1 tablet (5 mg total) by mouth 2 (two) times daily. 09/09/23  Yes McDiarmid, Krystal BIRCH, MD  atorvastatin  (LIPITOR) 40 MG tablet Take 40 mg by mouth daily.   Yes [provider]  baclofen (LIORESAL) 10 MG tablet Take 10 mg by mouth every 6 (six) hours as needed for muscle spasms.   Yes [provider]  calcitRIOL  (ROCALTROL ) 0.5 MCG capsule Take 0.5 mcg by mouth daily. 09/02/22  Yes [provider]  dorzolamide -timolol  (COSOPT ) 2-0.5 % ophthalmic solution Place 1 drop into both eyes 2 (two) times daily. 12/16/23  Yes [provider]  gabapentin  (NEURONTIN ) 100 MG capsule Take 2 capsules (200 mg total) by mouth every 8 (eight) hours. 01/30/24  Yes Mabe, Elna, MD  insulin  degludec (TRESIBA  FLEXTOUCH) 200 UNIT/ML FlexTouch Pen Inject 34 Units into the skin daily. 01/31/24  Yes Janna Ferrier, DO  insulin  lispro (HUMALOG) 100 UNIT/ML injection Inject 0-10 Units into the skin 3 (three) times daily before meals.   Yes [provider]  latanoprost  (XALATAN ) 0.005 % ophthalmic solution Place 1 drop into both eyes at bedtime. 09/03/22  Yes [provider]  lidocaine  (LIDODERM ) 5 % Place 1 patch onto the skin daily. Remove & Discard patch within 12 hours or as directed by MD 01/30/24  Yes Tharon Elna, MD  loratadine  (CLARITIN ) 10 MG tablet Take 10 mg by mouth daily.   Yes [provider]  metoprolol  tartrate (LOPRESSOR ) 25 MG tablet Take 0.5 tablets (12.5 mg total) by mouth 2 (two) times daily. 01/30/24  Yes Mabe, Elna, MD  Multiple Vitamin (MULTIVITAMIN) tablet Take 1 tablet by mouth daily.     Yes [provider]  nitroGLYCERIN  (NITROSTAT ) 0.4 MG SL tablet Place 1 tablet (0.4 mg total) under the tongue every 5 (five) minutes as needed for chest pain. If no relief by 3rd tab, call 911 10/04/22  Yes McDiarmid, Krystal BIRCH, MD  omeprazole  (PRILOSEC) 20 MG capsule Take 20 mg by mouth daily.   Yes  [provider]  Oxycodone  HCl 10 MG TABS Take 0.5 tablets (5 mg total) by mouth 3 (three) times daily. 01/31/24  Yes Janna Ferrier, DO  PARoxetine  (PAXIL ) 30 MG tablet TAKE 1 TABLET BY MOUTH EVERY DAY 06/25/23  Yes Rumball, Donald HERO, DO  polyethylene glycol (MIRALAX  / GLYCOLAX ) 17 g packet Take 17 g by mouth once.   Yes [provider]  senna (SENOKOT) 8.6 MG TABS tablet Take 1 tablet by mouth daily.   Yes [provider]  tamsulosin  (FLOMAX ) 0.4 MG CAPS capsule TAKE 1 CAPSULE BY MOUTH EVERYDAY AT BEDTIME 10/01/23  Yes Rumball, Donald HERO, DO  feeding supplement (ENSURE PLUS HIGH PROTEIN) LIQD Take 237 mLs by mouth 2 (two) times daily between meals. 01/30/24   Tharon Elna, MD  furosemide  (LASIX ) 20 MG tablet Take 3 tablets (60 mg total) by mouth daily. Patient not taking: Reported on 02/06/2024 11/06/23   Rumball, Alison M, DO  hydrALAZINE  (APRESOLINE ) 50 MG tablet Take 1 tablet (  50 mg total) by mouth 3 (three) times daily. Patient not taking: Reported on 02/06/2024 02/18/23   Fernande Elspeth BROCKS, MD  losartan -hydrochlorothiazide (HYZAAR) 50-12.5 MG tablet TAKE 1 TABLET BY MOUTH EVERY DAY Patient not taking: Reported on 02/06/2024 09/15/23   Rumball, Alison M, DO                                                                                                                                    Past Surgical History Past Surgical History:  Procedure Laterality Date   ANGIOPLASTY  2001   stent x 1   AV NODE ABLATION N/A 08/20/2017   Procedure: AV NODE ABLATION;  Surgeon: Fernande Elspeth BROCKS, MD;  Location: [facility] INVASIVE CV LAB;  Service: Cardiovascular;  Laterality: N/A;   BIV ICD GENERATOR CHANGEOUT N/A 05/17/2022   Procedure: BIV ICD GENERATOR CHANGEOUT;  Surgeon: Fernande Elspeth BROCKS, MD;  Location: [facility] INVASIVE CV LAB;  Service: Cardiovascular;  Laterality: N/A;   BIV ICD INSERTION CRT-D N/A 07/25/2017   Procedure: BIV ICD INSERTION CRT-D;  Surgeon: Fernande Elspeth BROCKS, MD;  Location: [facility] INVASIVE CV  LAB;  Service: Cardiovascular;  Laterality: N/A;   COLONOSCOPY  2000?   negative   COLONOSCOPY WITH PROPOFOL  N/A 02/08/2019   Procedure: COLONOSCOPY WITH PROPOFOL ;  Surgeon: Aneita Gwendlyn DASEN, MD;  Location: [facility] ENDOSCOPY;  Service: Endoscopy;  Laterality: N/A;   FOOT ARTHROTOMY Right    POLYPECTOMY  02/08/2019   Procedure: POLYPECTOMY;  Surgeon: Aneita Gwendlyn DASEN, MD;  Location: [facility] ENDOSCOPY;  Service: Endoscopy;;   TOE AMPUTATION Left 2012   Family History Family History  Problem Relation Age of Onset   Heart attack Mother    CVA Mother    Diabetes Mother    Heart attack Father    Colon cancer Neg Hx    Stomach cancer Neg Hx     Social History Social History   Tobacco Use   Smoking status: Former    Current packs/day: 0.00    Average packs/day: 0.3 packs/day for 51.4 years (12.9 ttl pk-yrs)    Types: Cigarettes    Start date: 05/28/1967    Quit date: 10/28/2018    Years since quitting: 5.2    Passive exposure: Past   Smokeless tobacco: Never  Vaping Use   Vaping status: Never Used  Substance Use Topics   Alcohol use: No    Comment: quit in 1993   Drug use: No   Allergies Enalapril maleate  Review of Systems Review of Systems  All other systems reviewed and are negative.   Physical Exam Vital Signs  I have reviewed the triage vital signs BP (!) 150/86   Pulse 70   Temp 97.9 F (36.6 C)   Resp 10   Ht 6' (1.829 m)   Wt 82.1 kg   SpO2 99%   BMI 24.55 kg/m  Physical Exam Vitals and nursing note reviewed.  Constitutional:  General: He is not in acute distress.    Appearance: Normal appearance.  HENT:     Head: Normocephalic.     Comments: Bruising over forehead. No nasal septal hematoma or facial instability     Mouth/Throat:     Mouth: Mucous membranes are moist.  Eyes:     Conjunctiva/sclera: Conjunctivae normal.  Cardiovascular:     Rate and Rhythm: Normal rate and regular rhythm.  Pulmonary:     Effort: Pulmonary effort is normal. No  respiratory distress.     Breath sounds: Normal breath sounds.  Abdominal:     General: Abdomen is flat.     Palpations: Abdomen is soft.     Tenderness: There is abdominal tenderness (generalized).  Musculoskeletal:     Right lower leg: No edema.     Left lower leg: No edema.     Comments: No midline C, T, L-spine tenderness.  No chest wall tenderness or crepitus.  Full painless range of motion at the bilateral upper extremities including the shoulders, elbows, wrists, hand and fingers, and in the bilateral lower extremities including the hips, knees, ankle, toes.  No focal bony tenderness, injury or deformity.  Skin:    General: Skin is warm and dry.     Capillary Refill: Capillary refill takes less than 2 seconds.  Neurological:     Mental Status: He is alert.     Comments: Oriented to self, knows he is at [facility].  Not sure why he is here or the year.  No cranial nerve deficit, moves all 4 extremities equally with symmetric strength, 5 out of 5 in the bilateral upper and lower extremity.  Following commands in all 4 extremities.  Somnolent  Psychiatric:        Mood and Affect: Mood normal.        Behavior: Behavior normal.     ED Results and Treatments Labs (all labs ordered are listed, but only abnormal results are displayed) Labs Reviewed  COMPREHENSIVE METABOLIC PANEL WITH GFR - Abnormal; Notable for the following components:      Result Value   CO2 21 (*)    Glucose, Bld 38 (*)    BUN 31 (*)    Creatinine, Ser 2.55 (*)    Total Protein 6.4 (*)    Albumin 2.5 (*)    GFR, Estimated 26 (*)    All other components within normal limits  CBC - Abnormal; Notable for the following components:   RBC 4.03 (*)    Hemoglobin 11.9 (*)    HCT 37.6 (*)    All other components within normal limits  URINALYSIS, ROUTINE W REFLEX MICROSCOPIC - Abnormal; Notable for the following components:   Protein, ur 30 (*)    Bacteria, UA RARE (*)    All other components within normal limits   PROTIME-INR - Abnormal; Notable for the following components:   Prothrombin Time 16.7 (*)    INR 1.3 (*)    All other components within normal limits  I-STAT CHEM 8, ED - Abnormal; Notable for the following components:   BUN 30 (*)    Creatinine, Ser 2.80 (*)    Glucose, Bld 35 (*)    Hemoglobin 11.9 (*)    HCT 35.0 (*)    All other components within normal limits  CBG MONITORING, ED - Abnormal; Notable for the following components:   Glucose-Capillary 28 (*)    All other components within normal limits  CBG MONITORING, ED - Abnormal; Notable for  the following components:   Glucose-Capillary 113 (*)    All other components within normal limits  ETHANOL  CK  I-STAT CG4 LACTIC ACID, ED  CBG MONITORING, ED  CBG MONITORING, ED  SAMPLE TO BLOOD BANK                                                                                                                          Radiology CT CHEST ABDOMEN PELVIS WO CONTRAST Result Date: 02/06/2024 CLINICAL DATA:  73 year old male status post unwitnessed fall around 0330 hours. On Eliquis . Scalp hematoma. EXAM: CT CHEST, ABDOMEN AND PELVIS WITHOUT CONTRAST TECHNIQUE: Multidetector CT imaging of the chest, abdomen and pelvis was performed following the standard protocol without IV contrast. RADIATION DOSE REDUCTION: This exam was performed according to the departmental dose-optimization program which includes automated exposure control, adjustment of the mA and/or kV according to patient size and/or use of iterative reconstruction technique. COMPARISON:  No chest or abdominal CT in the past 10 years. Lumbar spine CT 01/27/2024. FINDINGS: CT CHEST FINDINGS Cardiovascular: Left chest cardiac pacemaker. Cardiomegaly. Calcified coronary artery atherosclerosis and/or stents. Calcified aortic atherosclerosis. Vascular patency is not evaluated in the absence of IV contrast. No pericardial effusion. Mediastinum/Nodes: No evidence of hematoma or mass in the  noncontrast mediastinum. Right paratracheal lymph nodes are at the upper limits of normal. Lungs/Pleura: Small or trace layering pleural effusions greater on the left. Simple fluid density favoring transudate. Major airways are patent. Symmetric lung base atelectasis and widespread subpleural lung scarring. No pneumothorax. No pulmonary contusion or acute lung inflammation identified. Musculoskeletal: Mildly displaced left posterolateral 9th and 11th rib fractures. Nondisplaced left 10th rib fracture. Chronic appearing left anterolateral 4th through 6th rib fractures. No other acute rib fracture identified. Visible shoulder osseous structures appear intact and aligned. Sternum appears intact. Multilevel thoracic vertebral interbody ankylosis from flowing endplate osteophytes. No acute thoracic vertebral fracture identified. CT ABDOMEN PELVIS FINDINGS Hepatobiliary: Hyperdense material within the gallbladder lumen may be sludge. No calcified gallstones. Negative noncontrast liver. No obvious pericholecystic inflammation. Pancreas: Partially atrophied. Spleen: Negative noncontrast appearance. Adrenals/Urinary Tract: Negative adrenal glands. Nonobstructed kidneys. Bilateral renal cysts, the largest have simple fluid density (no follow up imaging recommended). No nephrolithiasis. Decompressed ureters. Distended urinary bladder (sagittal image 82). Incidental pelvic phleboliths. Stomach/Bowel: Moderate stool ball in the rectum (sagittal image 83). Decompressed upstream sigmoid colon. Decompressed descending colon. Redundant transverse colon with retained stool most pronounced at the hepatic flexure, right colon. Decompressed terminal ileum. Diminutive or absent appendix. Nondilated small bowel. Rectus muscle diastasis and small fat containing umbilical hernia. No herniated bowel. Decompressed stomach and duodenum. No pneumoperitoneum or free fluid identified. Vascular/Lymphatic: Extensive Aortoiliac calcified  atherosclerosis. Normal caliber abdominal aorta. Vascular patency is not evaluated in the absence of IV contrast. No lymphadenopathy identified. Reproductive: Negative. Other: No pelvis free fluid. Musculoskeletal: Grade 2 spondylolisthesis at the lumbosacral junction with degenerative interbody ankylosis there. Severe lower lumbar facet arthropathy associated. No acute lumbar vertebral fracture identified. Sacrum,  SI joints, pelvis and proximal femurs appear intact. No convincing acute superficial soft tissue injury. IMPRESSION: 1. Acute left 9th through 11th rib fractures non-to mildly displaced. Small layering pleural effusions greater on the left. No pneumothorax or pulmonary contusion. 2. No other acute traumatic injury identified in the Noncontrast Chest, Abdomen, or Pelvis. 3. Abnormal gallbladder density might reflect extensive sludge. No strong CT evidence of acute cholecystitis. 4. Rectal stool ball and distended urinary bladder. Consider fecal impaction. 5.  Aortic Atherosclerosis (ICD10-I70.0). Electronically Signed   By: VEAR Hurst M.D.   On: 02/06/2024 09:29   CT CERVICAL SPINE WO CONTRAST Result Date: 02/06/2024 CLINICAL DATA:  73 year old male status post unwitnessed fall around 0330 hours. On Eliquis . Scalp hematoma. EXAM: CT CERVICAL SPINE WITHOUT CONTRAST TECHNIQUE: Multidetector CT imaging of the cervical spine was performed without intravenous contrast. Multiplanar CT image reconstructions were also generated. RADIATION DOSE REDUCTION: This exam was performed according to the departmental dose-optimization program which includes automated exposure control, adjustment of the mA and/or kV according to patient size and/or use of iterative reconstruction technique. COMPARISON:  Head CT today.  Cervical spine CT 02/17/2021. FINDINGS: Alignment: Improved cervical lordosis since 2022. Cervicothoracic junction alignment is within normal limits. Chronic mild degenerative appearing anterolisthesis of C4  on C5. Bilateral posterior element alignment is within normal limits. Skull base and vertebrae: Stable bone mineralization. Visualized skull base is intact. No atlanto-occipital dissociation. C1 and C2 appear chronically degenerated but intact and aligned. No acute osseous abnormality identified. Soft tissues and spinal canal: No prevertebral fluid or swelling. No visible canal hematoma. Calcified carotid atherosclerosis in the neck. Otherwise negative noncontrast neck soft tissues. Disc levels: Chronic cervical spine degeneration, advanced at the C1-odontoid level, and C4-C5 through C6-C7. Stable since 2022. No significant cervical spinal stenosis by CT. Upper chest: Left chest cardiac pacemaker. Visible upper thoracic levels appear intact. Negative lung apices, noncontrast superior mediastinum. IMPRESSION: 1. No acute traumatic injury identified in the cervical spine. 2. Chronic cervical spine degeneration stable since 2022. Electronically Signed   By: VEAR Hurst M.D.   On: 02/06/2024 09:21   CT HEAD WO CONTRAST Result Date: 02/06/2024 CLINICAL DATA:  73 year old male status post unwitnessed fall around 0330 hours. On Eliquis . Scalp hematoma. EXAM: CT HEAD WITHOUT CONTRAST TECHNIQUE: Contiguous axial images were obtained from the base of the skull through the vertex without intravenous contrast. RADIATION DOSE REDUCTION: This exam was performed according to the departmental dose-optimization program which includes automated exposure control, adjustment of the mA and/or kV according to patient size and/or use of iterative reconstruction technique. COMPARISON:  Brain MRI 06/13/2017.  Head CT 01/26/2024. FINDINGS: Brain: Stable cerebral volume. Confluent chronic cerebral white matter hypodensity and chronic encephalomalacia in the anterior and middle left MCA territory. No midline shift, ventriculomegaly, mass effect, evidence of mass lesion, intracranial hemorrhage or evidence of cortically based acute infarction.  Vascular: Calcified atherosclerosis at the skull base. No suspicious intracranial vascular hyperdensity. Skull: No acute osseous abnormality identified. Sinuses/Orbits: Chronic right maxillary sinusitis with mucoperiosteal thickening, appears likely to be odontogenic. Left maxillary retention cysts are stable from the previous MRI. Other Visualized paranasal sinuses and mastoids are stable and well aerated. Other: Broad-based right anterior convexity scalp hematoma (series 4, image 66). Underlying right frontal bone and sinus appear intact. Orbit soft tissues appears stable. IMPRESSION: 1. Right scalp hematoma without underlying skull fracture. 2. No acute intracranial abnormality. Stable chronic left MCA territory infarct and cerebral white matter disease. 3. 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  Electronically Signed   By: VEAR Hurst M.D.   On: 02/06/2024 09:17    Pertinent labs & imaging results that were available during my care of the patient were reviewed by me and considered in my medical decision making (see MDM for details).  Medications Ordered in ED Medications  dextrose  50 % solution (  Not Given 02/06/24 0906)  apixaban  (ELIQUIS ) tablet 5 mg (5 mg Oral Given 02/06/24 1225)  atorvastatin  (LIPITOR) tablet 40 mg (has no administration in time range)  acetaminophen  (TYLENOL ) tablet 1,000 mg (has no administration in time range)  metoprolol  tartrate (LOPRESSOR ) tablet 12.5 mg (has no administration in time range)  PARoxetine  (PAXIL ) tablet 30 mg (has no administration in time range)  dextrose  50 % solution 50 mL (50 mLs Intravenous Given 02/06/24 0844)                                                                                                                                     Procedures Procedures  (including critical care time)  Medical Decision Making / ED Course   MDM:  73 year old presenting to the emergency department with fall.  Patient does have some signs  of head injury.  No evidence of extremity, thoracic trauma.  Does have some abdominal tenderness will obtain CT chest abdomen also.  Also obtain CT cervical spine.  Patient also seems very confused.  Could be due to head injury.  Differential also includes metabolic process.  Paramedics had reported normal blood sugar, however in the emergency department was found to be low, 28 on point-of-care testing.  Will give him D50.  Also check other metabolic testing.  Differential also includes other process such as infectious process.  CT scans, urinalysis to further evaluate.  Per niece patient was at baseline prior to unwitnessed fall.  Clinical Course as of 02/06/24 1238  Fri Feb 06, 2024  [id number] Mentation has improved some after receiving dextrose  however patient still somewhat confused.  Follow-up on further with results including urinalysis.  CT scan shows some signs of constipation, urinary retention but no other acute process. Admitted to family medicine team [WS]    Clinical Course User Index [WS] Francesca Elsie CROME, MD     Additional history obtained: -Additional history obtained from family and ems -External records from outside source obtained and reviewed including: Chart review including previous notes, labs, imaging, consultation notes including prior notes    Lab Tests: -I ordered, reviewed, and interpreted labs.   The pertinent results include:   Labs Reviewed  COMPREHENSIVE METABOLIC PANEL WITH GFR - Abnormal; Notable for the following components:      Result Value   CO2 21 (*)    Glucose, Bld 38 (*)    BUN 31 (*)    Creatinine, Ser 2.55 (*)    Total Protein 6.4 (*)    Albumin 2.5 (*)    GFR, Estimated 26 (*)  All other components within normal limits  CBC - Abnormal; Notable for the following components:   RBC 4.03 (*)    Hemoglobin 11.9 (*)    HCT 37.6 (*)    All other components within normal limits  URINALYSIS, ROUTINE W REFLEX MICROSCOPIC - Abnormal; Notable for  the following components:   Protein, ur 30 (*)    Bacteria, UA RARE (*)    All other components within normal limits  PROTIME-INR - Abnormal; Notable for the following components:   Prothrombin Time 16.7 (*)    INR 1.3 (*)    All other components within normal limits  I-STAT CHEM 8, ED - Abnormal; Notable for the following components:   BUN 30 (*)    Creatinine, Ser 2.80 (*)    Glucose, Bld 35 (*)    Hemoglobin 11.9 (*)    HCT 35.0 (*)    All other components within normal limits  CBG MONITORING, ED - Abnormal; Notable for the following components:   Glucose-Capillary 28 (*)    All other components within normal limits  CBG MONITORING, ED - Abnormal; Notable for the following components:   Glucose-Capillary 113 (*)    All other components within normal limits  ETHANOL  CK  I-STAT CG4 LACTIC ACID, ED  CBG MONITORING, ED  CBG MONITORING, ED  SAMPLE TO BLOOD BANK    Notable for hypoglycemia   EKG   EKG Interpretation Date/Time:  Friday February 06 2024 08:19:20 EDT Ventricular Rate:  70 PR Interval:  176 QRS Duration:  156 QT Interval:  475 QTC Calculation: 513 R Axis:   0  Text Interpretation: Ventricular-paced rhythm No further analysis attempted due to paced rhythm Confirmed by Francesca Fallow ([id number]) on 02/06/2024 8:28:58 AM         Imaging Studies ordered: I ordered imaging studies including CT scans  On my interpretation imaging demonstrates no acute traumatic injury other than rib fx  I independently visualized and interpreted imaging. I agree with the radiologist interpretation   Medicines ordered and prescription drug management: Meds ordered this encounter  Medications   dextrose  50 % solution 50 mL   dextrose  50 % solution    Troxler, Amanda L: cabinet override   apixaban  (ELIQUIS ) tablet 5 mg   atorvastatin  (LIPITOR) tablet 40 mg   acetaminophen  (TYLENOL ) tablet 1,000 mg   metoprolol  tartrate (LOPRESSOR ) tablet 12.5 mg   PARoxetine  (PAXIL )  tablet 30 mg    -I have reviewed the patients home medicines and have made adjustments as needed   Consultations Obtained: I requested consultation with the family medicine team ,  and discussed lab and imaging findings as well as pertinent plan - they recommend: admission   Cardiac Monitoring: The patient was maintained on a cardiac monitor.  I personally viewed and interpreted the cardiac monitored which showed an underlying rhythm of: paced   Social Determinants of Health:  Diagnosis or treatment significantly limited by social determinants of health: obesity   Reevaluation: After the interventions noted above, I reevaluated the patient and found that their symptoms have improved  Co morbidities that complicate the patient evaluation  Past Medical History:  Diagnosis Date   Arthritis    Benign neoplasm of descending colon    Benign neoplasm of sigmoid colon    Benign neoplasm of transverse colon    CAD in native artery    a. reported MI 2000, 2001, s/p stenting of the circumflex lesion extending into the second obtuse marginal branch with a drug-eluting  stent in 2003. b. low risk nuc 2015.   Chronic atrial fibrillation (HCC)    Chronic combined systolic and diastolic CHF (congestive heart failure) (HCC)    a. Previously diastolic, then EF 25-30% in 10/2016.   CKD (chronic kidney disease), stage III (HCC)    Depression    Diabetes mellitus    Edema of extremities    Erectile dysfunction    GERD (gastroesophageal reflux disease)    Hyperlipidemia    Hypertension    Myocardial infarction (HCC) 2000&2001   Obesity    Onychomycosis    OSA (obstructive sleep apnea)    S/P ICD (internal cardiac defibrillator) procedure and BiV device,  07/25/17 Medtronic  07/26/2017   Seizures (HCC)    Sleep apnea    Stroke (HCC)    Tubular adenoma of colon 10/2002      Dispostion: Disposition decision including need for hospitalization was considered, and patient admitted to the  hospital.    Final Clinical Impression(s) / ED Diagnoses Final diagnoses:  None     This chart was dictated using voice recognition software.  Despite best efforts to proofread,  errors can occur which can change the documentation meaning.    Francesca Elsie CROME, MD 02/06/24 1446

## 2024-02-06 NOTE — ED Notes (Signed)
 Trauma Response Nurse Documentation  Anthony [name] is a 73 y.o. male arriving to [facility] ED via EMS  On Eliquis  (apixaban ) daily. Trauma was activated as a Level 2 based on the following trauma criteria Elderly patients > 65 with head trauma on anti-coagulation (excluding ASA).  Patient cleared for CT by Dr. Francesca. Pt transported to CT with trauma response nurse present to monitor. RN remained with the patient throughout their absence from the department for clinical observation.   GCS 14.  History   Past Medical History:  Diagnosis Date   Arthritis    Benign neoplasm of descending colon    Benign neoplasm of sigmoid colon    Benign neoplasm of transverse colon    CAD in native artery    a. reported MI 2000, 2001, s/p stenting of the circumflex lesion extending into the second obtuse marginal branch with a drug-eluting stent in 2003. b. low risk nuc 2015.   Chronic atrial fibrillation (HCC)    Chronic combined systolic and diastolic CHF (congestive heart failure) (HCC)    a. Previously diastolic, then EF 25-30% in 10/2016.   CKD (chronic kidney disease), stage III (HCC)    Depression    Diabetes mellitus    Edema of extremities    Erectile dysfunction    GERD (gastroesophageal reflux disease)    Hyperlipidemia    Hypertension    Myocardial infarction (HCC) 2000&2001   Obesity    Onychomycosis    OSA (obstructive sleep apnea)    S/P ICD (internal cardiac defibrillator) procedure and BiV device,  07/25/17 Medtronic  07/26/2017   Seizures (HCC)    Sleep apnea    Stroke (HCC)    Tubular adenoma of colon 10/2002     Past Surgical History:  Procedure Laterality Date   ANGIOPLASTY  2001   stent x 1   AV NODE ABLATION N/A 08/20/2017   Procedure: AV NODE ABLATION;  Surgeon: Fernande Elspeth BROCKS, MD;  Location: [facility] INVASIVE CV LAB;  Service: Cardiovascular;  Laterality: N/A;   BIV ICD GENERATOR CHANGEOUT N/A 05/17/2022   Procedure: BIV ICD GENERATOR CHANGEOUT;  Surgeon: Fernande Elspeth BROCKS, MD;  Location: [facility] INVASIVE CV LAB;  Service: Cardiovascular;  Laterality: N/A;   BIV ICD INSERTION CRT-D N/A 07/25/2017   Procedure: BIV ICD INSERTION CRT-D;  Surgeon: Fernande Elspeth BROCKS, MD;  Location: [facility] INVASIVE CV LAB;  Service: Cardiovascular;  Laterality: N/A;   COLONOSCOPY  2000?   negative   COLONOSCOPY WITH PROPOFOL  N/A 02/08/2019   Procedure: COLONOSCOPY WITH PROPOFOL ;  Surgeon: Aneita Gwendlyn DASEN, MD;  Location: [facility] ENDOSCOPY;  Service: Endoscopy;  Laterality: N/A;   FOOT ARTHROTOMY Right    POLYPECTOMY  02/08/2019   Procedure: POLYPECTOMY;  Surgeon: Aneita Gwendlyn DASEN, MD;  Location: [facility] ENDOSCOPY;  Service: Endoscopy;;   TOE AMPUTATION Left 2012     Initial Focused Assessment (If applicable, or please see trauma documentation): Patient alert to name, disoriented to place, time, situation, GCS 14, PERR 4 Airway intact, bilateral breath sounds Pulses 1+ C-collar placed by EMS  CT's Completed:   CT Head, CT C-Spine, CT Chest w/ contrast, and CT abdomen/pelvis w/ contrast   Interventions:  IV, labs CT Head/Cspine/C/A/P  Plan for disposition:  {Trauma Dispo:26867}   Consults completed:  {Trauma Consults:26862} at ***.  Event Summary: Patient to ED via EMS after a fall at [facility].  Bedside handoff with ED RN Romualdo.    Alan CROME Lennyn Gange  Trauma Response RN  Please call  TRN at [phone] for further assistance.

## 2024-02-06 NOTE — TOC Initial Note (Signed)
 Transition of Care [facility]) - Initial/Assessment Note    Patient Details  Name: Anthony [name] MRN: [id number] Date of Birth: 1950/08/07  Transition of Care [facility]) CM/SW Contact:    Lauraine FORBES Saa, LCSWA Phone Number: 02/06/2024, 1:27 PM  Clinical Narrative:                  1:27 PM Per chart review, patient is from [facility]. SNF admissions confirmed patient was STR at SNF and is able to return with FL2 and insurance authorization approval prior to discharge. Prior to SNF admission, patient resided at home with spouse. Patient has a PCP and insurance. Patient has HH history with [facility] and DME (CPAP, rolling walker) history with [facility]. Patient's preferred [facility]'s [facility] [address] [facility] and [facility] [id number] [place]. CSW will submit FL2 upon PT/OT evaluations. CSW requested MD and RNCM to place PT/OT orders.   Expected Discharge Plan: Skilled Nursing Facility Barriers to Discharge: Continued Medical Work up, English as a second language teacher   Patient Goals and CMS Choice            Expected Discharge Plan and Services In-house Referral: Clinical Social Work     Living arrangements for the past 2 months: Single Family Home                                      Prior Living Arrangements/Services Living arrangements for the past 2 months: Single Family Home Lives with:: Spouse Patient language and need for interpreter reviewed:: Yes            Current home services: DME Criminal Activity/Legal Involvement Pertinent to Current Situation/Hospitalization: No - Comment as needed  Activities of Daily Living      Permission Sought/Granted Permission sought to share information with : Family Supports, Oceanographer granted to share information with : No (Contact information on chart)  Share Information with NAME: Anthony [name]  Permission granted to share info w AGENCY: [facility]  STR  Permission granted to share info w Relationship: Spouse  Permission granted to share info w Contact Information: [phone]  Emotional Assessment       Orientation: : Oriented to Self, Oriented to Place, Oriented to  Time Alcohol / Substance Use: Not Applicable Psych Involvement: No (comment)  Admission diagnosis:  Hypoglycemia [E16.2] Acute metabolic encephalopathy [G93.41] Patient Active Problem List   Diagnosis Date Noted   Hypoglycemia 02/06/2024   Scalp hematoma 02/06/2024   Rib fractures 02/06/2024   AMS (altered mental status) 01/29/2024   Chest pain at rest 01/27/2024   Type 2 diabetes mellitus with hyperosmolar hyperglycemic state (HHS) (HCC) 01/26/2024   Chronic back pain 01/26/2024   Peripheral neuropathy 10/11/2022   Urinary urgency 06/18/2022   CVA (cerebral vascular accident) (HCC) 02/20/2022   Low back pain with right-sided sciatica 01/07/2022   BPH (benign prostatic hyperplasia) 08/12/2021   Hip weakness 02/27/2021   Left leg weakness 02/17/2021   Neck mass 11/16/2020   Erectile dysfunction 05/31/2020   Polypharmacy 05/22/2020   Porokeratosis 04/26/2020   Other biomechanical lesions of lumbar region 12/07/2019   Neck muscle strain 11/22/2019   Hx of adenomatous colonic polyps    S/P ICD (internal cardiac defibrillator) Medtronic 07/26/2017   NICM (nonischemic cardiomyopathy) (HCC) 07/25/2017   Congestive heart failure, NYHA class 3, chronic, systolic (HCC) 07/25/2017   Atrial fibrillation, permanent (HCC) 07/25/2017   Memory change  Thrombocytopenia (HCC)    Right temporal lobe infarction (HCC) 06/13/2017   Seizures (HCC) 06/12/2017   Chronic kidney disease (CKD), stage IV (severe) (HCC) 01/22/2017   Secondary hyperparathyroidism of renal origin (HCC) 01/22/2017   Chronic anticoagulation 03/06/2016   Chronic, continuous use of opioids 10/18/2014   OSA on CPAP 04/04/2014   History of tobacco abuse 06/28/2013   DIABETIC  RETINOPATHY 02/24/2008    Insulin  dependent type 2 diabetes mellitus (HCC) 07/24/2006   HYPERCHOLESTEROLEMIA 07/24/2006   Depression 07/24/2006   HTN (hypertension) 07/24/2006   Coronary atherosclerosis 07/24/2006   GASTROESOPHAGEAL REFLUX, NO ESOPHAGITIS 07/24/2006   PCP:  Madelon Donald HERO, DO Pharmacy:   [phone] [place],  - [address] AT [facility] [address] [place] [address] Phone: [phone] Fax: [phone]  [facility] [id number] - [place], [place] - [address] [facility] [address] [place] [address] Phone: [phone] Fax: [phone]     Social Drivers of Health (SDOH) Social History: SDOH Screenings   Food Insecurity: No Food Insecurity (01/26/2024)  Housing: Low Risk  (01/26/2024)  Transportation Needs: No Transportation Needs (01/26/2024)  Utilities: Not At Risk (01/26/2024)  Alcohol Screen: Low Risk  (08/21/2023)  Depression (PHQ2-9): Low Risk  (10/29/2023)  Financial Resource Strain: Low Risk  (08/21/2023)  Physical Activity: Sufficiently Active (08/21/2023)  Social Connections: Socially Integrated (01/26/2024)  Stress: No Stress Concern Present (08/21/2023)  Tobacco Use: Medium Risk (01/26/2024)  Health Literacy: Adequate Health Literacy (08/21/2023)   SDOH Interventions:     Readmission Risk Interventions     No data to display

## 2024-02-06 NOTE — Plan of Care (Addendum)
 Called Cardiology and asked if the ICD could be interrogated. Trish to assist with reaching out and should be performed and results will be in media tab.   Called [facility] to discuss care with nursing to obtain collateral. Appreciative of their care and for providing the following information.  9/8 blood sugars ran high so they provided SSI coverage and was pretty stable thereafter.   9/11 he ate well (75% of his meal) on heart healthy, thin liquid diet, with no issues. Otherwise shared that he had a great day. Reportedly he got up in the middle of the night to use the restroom w/ his walker and lost his balance around 5-6 am. Nursing noticed that the patient wasn't as responsive as he had normally was and his BP was elevated 159/98.   Sugars: 9/11 - 2100 367 (got 10u SAI) > 9/12 5:15a 73, 6:58a 83 Insulin : 9/11 - LAI 34u at 800, 20u total SAI yesterday  Kathrine Melena, DO 02/06/24 1:55 PM

## 2024-02-06 NOTE — H&P (Signed)
 Hospital Admission History and Physical Service Pager: [phone]  Patient name: Anthony [name] Medical record number: [id number] Date of Birth: 28-Apr-1951 Age: 73 y.o. Gender: male  Primary Care Provider: Isaiah Lai Consultants: None Code Status: Full   Preferred Emergency Contact:   Contact Information     Name Relation Home Work Mobile   Loden,Denise Spouse [phone]        Other Contacts     Name Relation Home Work Mobile   [place] Niece   [phone]        Chief Complaint: AMS, hypoglycemia  Differential and Medical Decision Making:  STEFANOS [name] is a 73 y.o. male presenting with hypoglycemia and AMS.  Differential for hypoglycemia includes over-medication with insulin  and poor PO. Plan to call his facility [facility] to get more information on his medication administration, BG readings, and PO intake.   Differential for AMS includes hypoglycemia, orthostasis, TIA/stroke. Hypoglycemia is most likely given his low BG on presentation, then improvement of his mentation with correction of BG. Orthostasis is considered given his hypotension noted at prior admission. TIA/Stroke less likely given negative head imaging.  Assessment & Plan Hypoglycemia - Admit to [facility] Med Surg w/ attending Dr. Delores - Bedside swallow eval, can add diet if passes - BG q2h x4, can stop if stable - Add SSI once BG stable   - Home Regimen: Tressiba 34u daily (currently holding) - Day team to will SNF to get collateral  - Check orthostatic vitals when pt is more stable - Vitals per unit - AM CBC, BMP Scalp hematoma Minimal tenderness on exam, pain seems well controlled currently.  - Tylenol  prn - Restart home oxycodone  5mg  TID prn once mentation is stable Rib fractures New L 9-11 rib fractures, mildly-to-non displaced. Pain controlled currently. - Tylenol  as above AMS (altered mental status) Improving.  - Fall precautions - orthostatic vitals once more  stable - Holding home flomax  for potential orthostasis. Can restart if BP stable.  - No suprapubic tenderness, but can consider bladder scans if cf retention HTN (hypertension) - Cont home metop 12.5mg  daily - At prior admission, lasix , hyzaar, and hydralazine  were held. Clarify with SNF if these were restarted.    Chronic and Stable Conditions: Chronic Back Pain: restart home oxy, gabapentin  once mentation stable BPH: Holding flomax  as above HLD: home crestor   FEN/GI: NPO VTE Prophylaxis: home eliquis   Disposition: Med Surg  History of Present Illness:  Anthony [name] is a 73 y.o. male presenting with hypoglycemia and fall.   Pt was at rehab facility [facility]), and was found on the floor around 4-5am (last seen in chair ~330). Pt reports that he felt more clear yesterday, but unsure when he started to feel more confused.   ED Course:  On presentation, labs notable for BG 28. CT head and C spine notable for R scalp hematoma, no acute intracranial abnormalities. CT chest notable for L 9th through 11th rib fx, small pleural effs, and rectal stool ball, distended bladder. Pt was given D5, BG improved to 113.   Per chart review: Recently admitted 9/1 for HHS and discharged to SNF. His Beta blocker was adjusted during that time for hypotension too.   Pertinent Past Medical History: T2DM MI CKD stage III Combined CHF Remainder reviewed in history tab.   Pertinent Past Surgical History: ICD procedure  Remainder reviewed in history tab.   Pertinent Social History: Lives w/ wife, but currently at SNF  Pertinent Family History: Mother-CVA,  diabetes, heart attack Father-heart attack   Important Outpatient Medications: Eliquis  Gabapentin  Metoprolol  12.5 mg twice daily Nitroglycerin  Omeprazole  Oxycodone  Paroxetine  Crestor  Tamsulosin   Objective: BP 123/80   Pulse 70   Temp 98.2 F (36.8 C) (Oral)   Resp 11   Ht 6' (1.829 m)   Wt 82.1 kg   SpO2 99%   BMI  24.55 kg/m  Exam: General: Older man laying in bed.  Neuro: Sleepy but easily awakens to voice, needs to be awoken several times to answer. Pt is oriented to self, day (Friday), place Geroge long), and reason (fall). Follows commands. PERRLA.  HEENT: NCAT. MMM. Abrasion above R eyebrow.  CV: RRR, no murmurs.   Resp: CTAB, no wheezing or crackles. Normal WOB on RA.  Abm: Soft, nontender, nondistended. BS present. MSK: Mild tenderness along Left posterior chest wall.  Skin: Warm, well perfused   Labs:  CBC BMET  Recent Labs  Lab 02/06/24 0825 02/06/24 0832  WBC 9.1  --   HGB 11.9* 11.9*  HCT 37.6* 35.0*  PLT 190  --    Recent Labs  Lab 02/06/24 0825 02/06/24 0832  NA 135 137  K 4.4 4.3  CL 103 105  CO2 21*  --   BUN 31* 30*  CREATININE 2.55* 2.80*  GLUCOSE 38* 35*  CALCIUM  9.0  --       EKG: Ventricular paced rhythm, stable compared to prior from 01/27/24   Imaging Studies Performed:  CT head 1. Right scalp hematoma without underlying skull fracture.  2. No acute intracranial abnormality. Stable chronic left MCA  territory infarct and cerebral white matter disease.  3. 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   CT C spine 1. No acute traumatic injury identified in the cervical spine.  2. Chronic cervical spine degeneration stable since 2022.   CT-C/A/P 1. Acute left 9th through 11th rib fractures non-to mildly  displaced. Small layering pleural effusions greater on the left. No  pneumothorax or pulmonary contusion.  2. No other acute traumatic injury identified in the Noncontrast  Chest, Abdomen, or Pelvis.  3. Abnormal gallbladder density might reflect extensive sludge. No  strong CT evidence of acute cholecystitis.  4. Rectal stool ball and distended urinary bladder. Consider fecal  impaction.  5. Aortic Atherosclerosis (ICD10-I70.0).     Elicia Hamlet, MD 02/06/2024, 11:02 AM PGY-3, [facility]  FPTS Intern pager:  [phone], text pages welcome Secure chat group [facility] Teaching Service

## 2024-02-06 NOTE — Assessment & Plan Note (Addendum)
 Resolved, restarted home meds. -Pending orthostatic vitals

## 2024-02-06 NOTE — Assessment & Plan Note (Addendum)
 No further syncopal events.  Pending ICD interrogation.  Only as needed Tylenol  for pain control, will continue to monitor and manage pain as needed. -PT/OT following -Follow-up ICD interrogation with cardiology

## 2024-02-06 NOTE — Assessment & Plan Note (Signed)
 New L 9-11 rib fractures, mildly-to-non displaced. Pain controlled currently. - Tylenol  as above

## 2024-02-06 NOTE — Assessment & Plan Note (Deleted)
-   Cont home metop 12.5mg  daily - At prior admission, lasix , hyzaar, and hydralazine  were held. Clarify with SNF if these were restarted.

## 2024-02-06 NOTE — Plan of Care (Addendum)
 FMTS Interim Progress Note  Patient assessed as part of night rounds.  Sitting up in bed, reports he feels well currently.  Denies any pain including chest pain and abdominal pain.  Denies shortness of breath.  Reports he got up from the bed earlier and ambulated to the door.  States he took a couple steps on his own but felt better if someone else was there just for more certainty.  Denies that he felt off balance or weak.  O: BP 124/75 (BP Location: Left Arm)   Pulse 70   Temp 99.1 F (37.3 C) (Oral)   Resp 18   Ht 6' (1.829 m)   Wt 82.1 kg   SpO2 100%   BMI 24.55 kg/m    General: Sitting in bed, NAD CV: RRR without murmur RESP: Normal work of breathing on 2L home (home oxygen) Abdomen: Soft, nontender, nondistended Ext: Mild nonpitting peripheral edema bilaterally with chronic venous stasis changes  A/P: AMS Resolved, likely in setting of hypoglycemia. -Restart home meds  Hypoglycemia Resolved, CBGs 100-200s.  Unclear what precipitated hypoglycemic event will continue to monitor and add on insulin  as indicated.  Theophilus Pagan, MD 02/06/2024, 10:13 PM PGY-3, [facility] Family Medicine Service pager [phone]

## 2024-02-06 NOTE — Assessment & Plan Note (Signed)
 No further syncopal events.  Pending ICD interrogation.  Only as needed Tylenol  for pain control, will continue to monitor and manage pain as needed. -PT/OT following -Follow-up ICD interrogation with cardiology

## 2024-02-06 NOTE — ED Triage Notes (Signed)
 BIB [facility] from [facility] fall around 0300-0400 on eliquis  hemtoma to L forehead. A&O x person and place delayed responses. Paced rhythm.   170/112 P-70 RR-16 CBG- 83

## 2024-02-06 NOTE — Assessment & Plan Note (Signed)
 Minimal tenderness on exam, pain seems well controlled currently.  - Tylenol  prn - Restart home oxycodone  5mg  TID prn once mentation is stable

## 2024-02-06 NOTE — ED Notes (Signed)
Patient transported to CT with TRN at bedside.

## 2024-02-06 NOTE — Progress Notes (Signed)
 Orthopedic Tech Progress Note Patient Details:  [name] 1951-04-03 [id number] Level 2 Trauma, not needed. Patient ID: Anthony [name], male   DOB: [birth year], 73 y.o.   MRN: [id number]  Efrain DELENA Cos 02/06/2024, 8:25 AM

## 2024-02-06 NOTE — ED Notes (Signed)
 Nt called CCMD@8 :51am

## 2024-02-06 NOTE — Assessment & Plan Note (Signed)
-   Admit to [facility]MTS Med Surg w/ attending Dr. Delores - Bedside swallow eval, can add diet if passes - BG q2h x4, can stop if stable - Add SSI once BG stable   - Home Regimen: Tressiba 34u daily (currently holding) - Day team to will SNF to get collateral  - Check orthostatic vitals when pt is more stable - Vitals per unit - AM CBC, BMP

## 2024-02-06 NOTE — Progress Notes (Incomplete)
 Daily Progress Note Intern Pager: [phone]  Patient name: Anthony [name] Medical record number: [id number] Date of birth: 1950/09/05 Age: 73 y.o. Gender: male  Primary Care Provider: Madelon Donald HERO, DO Consultants: None Code Status: Full code  Pt Overview and Major Events to Date:  9/12: Admitted to FMTS  Assessment and Plan: 73 year old male with PMHx T2DM, MI, CKD5, A-fib, OSA and HFpEF admitted for syncope with multiple rib fractures in the setting of hypoglycemia at acute rehab. Assessment & Plan Hypoglycemia Insulin  dependent type 2 diabetes mellitus (HCC) Hypoglycemia resolved. CBGs stabilized, space out to 3 times daily and nightly monitoring.  Will add back very sensitive sliding scale given elevation >200. - vsSSI plus CBGs - Consider adding back home LAI if persistently elevated Rib fractures Scalp hematoma Syncope No further syncopal events.  Pending ICD interrogation.  Only as needed Tylenol  for pain control, will continue to monitor and manage pain as needed. -PT/OT following -Follow-up ICD interrogation with cardiology AMS (altered mental status) (Resolved: 02/06/2024) Resolved, restarted home meds. -Pending orthostatic vitals  Chronic and Stable Conditions: Chronic Back Pain: Restart home Gabapentin  BPH: Restart home Flomax  0.4 mg daily HLD: Continue home Atorvastatin  40 mg daily GERD: Restart home PPI OSA: CPAP nightly CKD 5: Appears to be baseline renal function.  Avoid nephrotoxic agents. A-fib: Continue home Eliquis  5 mg twice daily HTN: Continue holding home hydralazine  50 mg 3 times daily and Hyzaar 50-12.5 mg daily HFpEF: Restart home Lasix  60 mg daily Constipation: Continue senna daily   FEN/GI: Carb modified PPx: Eliquis  5 mg twice daily Dispo: Pending PT/OT evaluation.  Subjective:  Reports feeling well. Feels like he has done well at rehab and would like to return home if possible or consider a different rehab. Eating well. Denies  N/V.  Objective: Temp:  [97.9 F (36.6 C)-98.4 F (36.9 C)] 98.4 F (36.9 C) (09/12 1749) Pulse Rate:  [70-96] 96 (09/12 1749) Resp:  [0-23] 18 (09/12 1749) BP: (106-163)/(61-101) 106/61 (09/12 1749) SpO2:  [88 %-100 %] 98 % (09/12 1749) Weight:  [82.1 kg] 82.1 kg (09/12 9177) Physical Exam: General: Sitting up in bed, NAD Cardiovascular: RRR without murmur Respiratory: CTAB, Normal WOB Abdomen: Soft, non-tender, non-distended Extremities: Non-pitting peripheral edema bilaterally  Laboratory: Most recent CBC Lab Results  Component Value Date   WBC 9.1 02/06/2024   HGB 11.9 (L) 02/06/2024   HCT 35.0 (L) 02/06/2024   MCV 93.3 02/06/2024   PLT 190 02/06/2024   Most recent BMP    Latest Ref Rng & Units 02/06/2024    8:32 AM  BMP  Glucose 70 - 99 mg/dL 35   BUN 8 - 23 mg/dL 30   Creatinine 9.38 - 1.24 mg/dL 7.19   Sodium 864 - 854 mmol/L 137   Potassium 3.5 - 5.1 mmol/L 4.3   Chloride 98 - 111 mmol/L 105     Other pertinent labs: Glucose: 206  Imaging/Diagnostic Tests: CT CHEST ABDOMEN PELVIS WO CONTRAST Result Date: 02/06/2024 IMPRESSION: 1. Acute left 9th through 11th rib fractures non-to mildly displaced. Small layering pleural effusions greater on the left. No pneumothorax or pulmonary contusion. 2. No other acute traumatic injury identified in the Noncontrast Chest, Abdomen, or Pelvis. 3. Abnormal gallbladder density might reflect extensive sludge. No strong CT evidence of acute cholecystitis. 4. Rectal stool ball and distended urinary bladder. Consider fecal impaction. 5.  Aortic Atherosclerosis (ICD10-I70.0). Electronically Signed   By: VEAR Hurst M.D.   On: 02/06/2024 09:29   CT CERVICAL  SPINE WO CONTRAST Result Date: 02/06/2024 IMPRESSION: 1. No acute traumatic injury identified in the cervical spine. 2. Chronic cervical spine degeneration stable since 2022. Electronically Signed   By: VEAR Hurst M.D.   On: 02/06/2024 09:21   CT HEAD WO CONTRAST Result Date:  02/06/2024 IMPRESSION: 1. Right scalp hematoma without underlying skull fracture. 2. No acute intracranial abnormality. Stable chronic left MCA territory infarct and cerebral white matter disease. 3. 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 Electronically Signed   By: VEAR Hurst M.D.   On: 02/06/2024 09:17     Theophilus Pagan, MD 02/06/2024, 7:32 PM  PGY-3, [facility] Family Medicine FPTS Intern pager: [phone], text pages welcome Secure chat group [facility] Teaching Service

## 2024-02-07 DIAGNOSIS — E119 Type 2 diabetes mellitus without complications: Secondary | ICD-10-CM

## 2024-02-07 DIAGNOSIS — Z794 Long term (current) use of insulin: Secondary | ICD-10-CM

## 2024-02-07 LAB — HEMOGLOBIN A1C
Hgb A1c MFr Bld: 13.3 % — ABNORMAL HIGH (ref 4.8–5.6)
Mean Plasma Glucose: 335.01 mg/dL

## 2024-02-07 LAB — CBC
HCT: 31.6 % — ABNORMAL LOW (ref 39.0–52.0)
Hemoglobin: 10.5 g/dL — ABNORMAL LOW (ref 13.0–17.0)
MCH: 29.9 pg (ref 26.0–34.0)
MCHC: 33.2 g/dL (ref 30.0–36.0)
MCV: 90 fL (ref 80.0–100.0)
Platelets: 175 K/uL (ref 150–400)
RBC: 3.51 MIL/uL — ABNORMAL LOW (ref 4.22–5.81)
RDW: 14.7 % (ref 11.5–15.5)
WBC: 9.1 K/uL (ref 4.0–10.5)
nRBC: 0 % (ref 0.0–0.2)

## 2024-02-07 LAB — BASIC METABOLIC PANEL WITH GFR
Anion gap: 13 (ref 5–15)
BUN: 31 mg/dL — ABNORMAL HIGH (ref 8–23)
CO2: 20 mmol/L — ABNORMAL LOW (ref 22–32)
Calcium: 8.7 mg/dL — ABNORMAL LOW (ref 8.9–10.3)
Chloride: 102 mmol/L (ref 98–111)
Creatinine, Ser: 2.46 mg/dL — ABNORMAL HIGH (ref 0.61–1.24)
GFR, Estimated: 27 mL/min — ABNORMAL LOW (ref >60)
Glucose, Bld: 202 mg/dL — ABNORMAL HIGH (ref 70–99)
Potassium: 4.5 mmol/L (ref 3.5–5.1)
Sodium: 135 mmol/L (ref 135–145)

## 2024-02-07 LAB — GLUCOSE, CAPILLARY
Glucose-Capillary: 154 mg/dL — ABNORMAL HIGH (ref 70–99)
Glucose-Capillary: 260 mg/dL — ABNORMAL HIGH (ref 70–99)
Glucose-Capillary: 385 mg/dL — ABNORMAL HIGH (ref 70–99)
Glucose-Capillary: 326 mg/dL — ABNORMAL HIGH (ref 70–99)
Glucose-Capillary: 274 mg/dL — ABNORMAL HIGH (ref 70–99)

## 2024-02-07 MED ORDER — CALCITRIOL 0.5 MCG PO CAPS
0.5000 ug | ORAL_CAPSULE | Freq: Every day | ORAL | Status: DC
  Administered 2024-02-07 – 2024-02-09 (×3): 0.5 ug via ORAL
  Filled 2024-02-07 (×3): qty 1

## 2024-02-07 MED ORDER — INSULIN ASPART 100 UNIT/ML IJ SOLN
0.0000 [IU] | Freq: Three times a day (TID) | INTRAMUSCULAR | Status: DC
  Administered 2024-02-07: 5 [IU] via SUBCUTANEOUS
  Administered 2024-02-07: 3 [IU] via SUBCUTANEOUS

## 2024-02-07 MED ORDER — INSULIN GLARGINE 100 UNIT/ML ~~LOC~~ SOLN: 10.0000 [IU] | Freq: Every day | SUBCUTANEOUS | Status: DC

## 2024-02-07 MED ORDER — INSULIN GLARGINE 100 UNIT/ML ~~LOC~~ SOLN
10.0000 [IU] | Freq: Every day | SUBCUTANEOUS | Status: DC
  Administered 2024-02-07 – 2024-02-09 (×3): 10 [IU] via SUBCUTANEOUS
  Filled 2024-02-07 (×3): qty 0.1

## 2024-02-07 NOTE — Assessment & Plan Note (Signed)
 Stable.   - Tylenol  PRN for pain - Incentive spirometry  - PT/OT following - Follow-up ICD interrogation with cardiology

## 2024-02-07 NOTE — Plan of Care (Signed)
   Problem: Education: Goal: Knowledge of General Education information will improve Description: Including pain rating scale, medication(s)/side effects and non-pharmacologic comfort measures Outcome: Progressing   Problem: Clinical Measurements: Goal: Will remain free from infection Outcome: Progressing Goal: Respiratory complications will improve Outcome: Progressing

## 2024-02-07 NOTE — Progress Notes (Signed)
   02/07/24 2347  BiPAP/CPAP/SIPAP  $ Non-Invasive Home Ventilator  Initial  $ Face Mask Large  Yes  BiPAP/CPAP/SIPAP Pt Type Adult  BiPAP/CPAP/SIPAP Resmed  Mask Type Full face mask  Mask Size Large  FiO2 (%) 21 %  Patient Home Machine No  Patient Home Mask No  Patient Home Tubing No  Auto Titrate Yes  Minimum cmH2O 5 cmH2O  Maximum cmH2O 20 cmH2O  CPAP/SIPAP surface wiped down Yes  Device Plugged into RED Power Outlet Yes

## 2024-02-07 NOTE — Evaluation (Signed)
 Physical Therapy Evaluation Patient Details Name: Anthony [name] MRN: [id number] DOB: 1950-12-11 Today's Date: 02/07/2024  History of Present Illness  73 y.o. male adm 02/06/24 from [facility] after fall with Lt forehead hematoma, Lt 9-11 rib fx and hypoglycemia. PMHx: DMII, HTN, OSA, CHF, Afib, depression, seizure, ICD, CVA, chronic back pain  Clinical Impression  Pt pleasantly confused and soiled in urine on arrival without awareness. Pt able to get OOB, walk in hall and up to chair with CGA. Pt reports wife at home and moving much better now than he did on D/C to SNF. If wife able to provide assist for cognition and IADLs then return home with HHPT appropriate vs continuing his ST-SNF stay pending family availability. Pt will benefit from acute therapy to maximize mobility, safety and function to decrease burden of care.     95% on RA      If plan is discharge home, recommend the following: A little help with walking and/or transfers;Assistance with cooking/housework;Direct supervision/assist for medications management;Direct supervision/assist for financial management;Assist for transportation;Help with stairs or ramp for entrance;Supervision due to cognitive status;A little help with bathing/dressing/bathroom   Can travel by private vehicle   Yes    Equipment Recommendations Rolling walker (2 wheels);BSC/3in1  Recommendations for Other Services       Functional Status Assessment Patient has had a recent decline in their functional status and demonstrates the ability to make significant improvements in function in a reasonable and predictable amount of time.     Precautions / Restrictions Precautions Precautions: Fall;Other (comment) Recall of Precautions/Restrictions: Impaired Precaution/Restrictions Comments: incontinent      Mobility  Bed Mobility Overal bed mobility: Needs Assistance Bed Mobility: Supine to Sit     Supine to sit: Supervision     General  bed mobility comments: HOB 20 degrees with use of rail, pt able to pivot to EOB without physical assist    Transfers Overall transfer level: Needs assistance   Transfers: Sit to/from Stand Sit to Stand: Contact guard assist           General transfer comment: CGA to rise from bed and bSC x 3 total trials, cues for hand placement. Pt standing urgently due to incontinent stool    Ambulation/Gait Ambulation/Gait assistance: Contact guard assist Gait Distance (Feet): 220 Feet Assistive device: Rolling walker (2 wheels) Gait Pattern/deviations: Step-through pattern, Decreased stride length, Trunk flexed   Gait velocity interpretation: <1.8 ft/sec, indicate of risk for recurrent falls   General Gait Details: cues for posture and direction. pt with good stability with RW and able to self-regulate gait distance  Stairs            Wheelchair Mobility     Tilt Bed    Modified Rankin (Stroke Patients Only)       Balance Overall balance assessment: Needs assistance Sitting-balance support: No upper extremity supported, Feet supported Sitting balance-Leahy Scale: Good     Standing balance support: Bilateral upper extremity supported, Reliant on assistive device for balance, Single extremity supported Standing balance-Leahy Scale: Poor Standing balance comment: single UE support in standing for pericare, RW for gait                             Pertinent Vitals/Pain Pain Assessment Pain Assessment: No/denies pain    Home Living Family/patient expects to be discharged to:: Private residence Living Arrangements: Spouse/significant other Available Help at Discharge: Family;Available 24 hours/day Type of Home:  House Home Access: Stairs to enter Entrance Stairs-Rails: Right;Left;Can reach both Secretary/administrator of Steps: 5   Home Layout: One level Home Equipment: Cane - single point Additional Comments: home set up from prior admission, no family  present to verify    Prior Function Prior Level of Function : Independent/Modified Independent                     Extremity/Trunk Assessment   Upper Extremity Assessment Upper Extremity Assessment: Generalized weakness    Lower Extremity Assessment Lower Extremity Assessment: Generalized weakness    Cervical / Trunk Assessment Cervical / Trunk Assessment: Kyphotic  Communication   Communication Communication: No apparent difficulties    Cognition Arousal: Alert Behavior During Therapy: Flat affect   PT - Cognitive impairments: No family/caregiver present to determine baseline, Memory, Awareness, Safety/Judgement, Orientation, Problem solving                         Following commands: Intact Following commands impaired: Follows one step commands with increased time     Cueing Cueing Techniques: Verbal cues, Visual cues     General Comments      Exercises     Assessment/Plan    PT Assessment Patient needs continued PT services  PT Problem List Decreased strength;Decreased activity tolerance;Decreased balance;Decreased mobility;Decreased knowledge of use of DME;Decreased knowledge of precautions;Pain;Decreased safety awareness;Decreased cognition       PT Treatment Interventions DME instruction;Gait training;Stair training;Functional mobility training;Therapeutic activities;Balance training;Therapeutic exercise;Neuromuscular re-education;Patient/family education;Cognitive remediation    PT Goals (Current goals can be found in the Care Plan section)  Acute Rehab PT Goals Patient Stated Goal: return home PT Goal Formulation: With patient Time For Goal Achievement: 02/21/24 Potential to Achieve Goals: Good    Frequency Min 2X/week     Co-evaluation               AM-PAC PT 6 Clicks Mobility  Outcome Measure Help needed turning from your back to your side while in a flat bed without using bedrails?: A Little Help needed moving from  lying on your back to sitting on the side of a flat bed without using bedrails?: A Little Help needed moving to and from a bed to a chair (including a wheelchair)?: A Little Help needed standing up from a chair using your arms (e.g., wheelchair or bedside chair)?: A Little Help needed to walk in hospital room?: A Little Help needed climbing 3-5 steps with a railing? : A Lot 6 Click Score: 17    End of Session   Activity Tolerance: Patient tolerated treatment well Patient left: in chair;with call bell/phone within reach;with chair alarm set;with nursing/sitter in room Nurse Communication: Mobility status PT Visit Diagnosis: Other abnormalities of gait and mobility (R26.89);Muscle weakness (generalized) (M62.81);Difficulty in walking, not elsewhere classified (R26.2)    Time: 9175-9147 PT Time Calculation (min) (ACUTE ONLY): 28 min   Charges:   PT Evaluation $PT Eval Moderate Complexity: 1 Mod PT Treatments $Gait Training: 8-22 mins PT General Charges $$ ACUTE PT VISIT: 1 Visit         Lenoard SQUIBB, PT Acute Rehabilitation Services Office: [phone]   Lenoard NOVAK Sophina Mitten 02/07/2024, 9:42 AM

## 2024-02-07 NOTE — Assessment & Plan Note (Signed)
 Chronic Back Pain: Restart home Gabapentin  BPH: Restart home Flomax  0.4 mg daily HLD: Continue home Atorvastatin  40 mg daily GERD: Continue Protonix  40 mg  OSA: CPAP nightly CKD 5: Continue calcitriol . Avoid nephrotoxic agents. A-fib: Continue home Eliquis  5 mg twice daily, Lopressor  12.5 mg BID HTN: Continue holding home hydralazine  50 mg 3 times daily and Hyzaar 50-12.5 mg daily HFpEF: Lasix  60 mg daily Constipation: Continue senna daily Depression: Continue Paxil  30 mg daily

## 2024-02-07 NOTE — Assessment & Plan Note (Signed)
 Elevated sugars yesterday. A1c 13.3 this admission.  Home medications: 34 units Tresiba , 0-10 units Humalog  - vsSSI *** - Lantus  10 units started 9/13

## 2024-02-07 NOTE — Progress Notes (Incomplete)
     Daily Progress Note Intern Pager: [phone]  Patient name: Anthony [name] Medical record number: [id number] Date of birth: Dec 04, 1950 Age: 73 y.o. Gender: male  Primary Care Provider: Madelon Donald HERO, DO Consultants: None Code Status: Full code   Pt Overview and Major Events to Date:  9/12: Admitted   Assessment and Plan:  Anthony [name] is a 73 y.o. male admitted for syncopal episode causing multiple rib fractures most likely 2/2 to hypoglycemia. Pertinent PMH/PSH includes T2DM, CKD5, A-fib, OSA, HFpEF.  Assessment & Plan Hypoglycemia (Resolved: 02/07/2024) Insulin  dependent type 2 diabetes mellitus (HCC) Elevated sugars yesterday. A1c 13.3 this admission.  Home medications: 34 units Tresiba , 0-10 units Humalog  - vsSSI *** - Lantus  10 units started 9/13 Rib fractures Scalp hematoma Syncope Anthony [name].   - Tylenol  PRN for pain - Incentive spirometry  - PT/OT following - Follow-up ICD interrogation with cardiology Chronic health problem Chronic Back Pain: Restart home Gabapentin  BPH: Restart home Flomax  0.4 mg daily HLD: Continue home Atorvastatin  40 mg daily GERD: Continue Protonix  40 mg  OSA: CPAP nightly CKD 5: Continue calcitriol . Avoid nephrotoxic agents. A-fib: Continue home Eliquis  5 mg twice daily, Lopressor  12.5 mg BID HTN: Continue holding home hydralazine  50 mg 3 times daily and Hyzaar 50-12.5 mg daily HFpEF: Lasix  60 mg daily Constipation: Continue senna daily Depression: Continue Paxil  30 mg daily   FEN/GI: Carb modified  PPx: Eliquis  5 mg BID Dispo:SNF today. Barriers include insurance authorization.   Subjective:  ***  Objective: Temp:  [97.9 F (36.6 C)-98.8 F (37.1 C)] 98.6 F (37 C) (09/13 1953) Pulse Rate:  [69-70] 70 (09/13 1953) Resp:  [17-20] 17 (09/13 1953) BP: (101-139)/(49-77) 139/77 (09/13 1953) SpO2:  [73 %-100 %] 100 % (09/13 1953) Physical Exam: General: *** Cardiovascular: *** Respiratory: *** Abdomen: *** Extremities:  ***  Laboratory: Most recent CBC Lab Results  Component Value Date   WBC 9.1 02/07/2024   HGB 10.5 (L) 02/07/2024   HCT 31.6 (L) 02/07/2024   MCV 90.0 02/07/2024   PLT 175 02/07/2024   Most recent BMP    Latest Ref Rng & Units 02/07/2024    5:38 AM  BMP  Glucose 70 - 99 mg/dL 797   BUN 8 - 23 mg/dL 31   Creatinine 9.38 - 1.24 mg/dL 7.53   Sodium 864 - 854 mmol/L 135   Potassium 3.5 - 5.1 mmol/L 4.5   Chloride 98 - 111 mmol/L 102   CO2 22 - 32 mmol/L 20   Calcium  8.9 - 10.3 mg/dL 8.7    Imaging/Diagnostic Tests: No new imaging.   Anthony Perkins, DO 02/07/2024, 9:41 PM  PGY-3, [place] [facility] FPTS Intern pager: [phone], text pages welcome Secure chat group [facility] Teaching Service

## 2024-02-07 NOTE — Plan of Care (Signed)

## 2024-02-08 LAB — BASIC METABOLIC PANEL WITH GFR
Anion gap: 11 (ref 5–15)
BUN: 29 mg/dL — ABNORMAL HIGH (ref 8–23)
CO2: 24 mmol/L (ref 22–32)
Calcium: 9 mg/dL (ref 8.9–10.3)
Chloride: 103 mmol/L (ref 98–111)
Creatinine, Ser: 2.25 mg/dL — ABNORMAL HIGH (ref 0.61–1.24)
GFR, Estimated: 30 mL/min — ABNORMAL LOW (ref >60)
Glucose, Bld: 244 mg/dL — ABNORMAL HIGH (ref 70–99)
Potassium: 3.7 mmol/L (ref 3.5–5.1)
Sodium: 138 mmol/L (ref 135–145)

## 2024-02-08 LAB — CBC
HCT: 33 % — ABNORMAL LOW (ref 39.0–52.0)
Hemoglobin: 10.9 g/dL — ABNORMAL LOW (ref 13.0–17.0)
MCH: 29.8 pg (ref 26.0–34.0)
MCHC: 33 g/dL (ref 30.0–36.0)
MCV: 90.2 fL (ref 80.0–100.0)
Platelets: 190 K/uL (ref 150–400)
RBC: 3.66 MIL/uL — ABNORMAL LOW (ref 4.22–5.81)
RDW: 14.5 % (ref 11.5–15.5)
WBC: 6.3 K/uL (ref 4.0–10.5)
nRBC: 0 % (ref 0.0–0.2)

## 2024-02-08 LAB — GLUCOSE, CAPILLARY
Glucose-Capillary: 188 mg/dL — ABNORMAL HIGH (ref 70–99)
Glucose-Capillary: 300 mg/dL — ABNORMAL HIGH (ref 70–99)
Glucose-Capillary: 200 mg/dL — ABNORMAL HIGH (ref 70–99)
Glucose-Capillary: 271 mg/dL — ABNORMAL HIGH (ref 70–99)

## 2024-02-08 MED ORDER — INSULIN ASPART 100 UNIT/ML IJ SOLN
0.0000 [IU] | Freq: Three times a day (TID) | INTRAMUSCULAR | Status: DC
  Administered 2024-02-08: 7 [IU] via SUBCUTANEOUS
  Administered 2024-02-08: 2 [IU] via SUBCUTANEOUS
  Administered 2024-02-09: 3 [IU] via SUBCUTANEOUS
  Administered 2024-02-09: 2 [IU] via SUBCUTANEOUS

## 2024-02-08 MED ORDER — INSULIN ASPART 100 UNIT/ML IJ SOLN
0.0000 [IU] | Freq: Every day | INTRAMUSCULAR | Status: DC
  Administered 2024-02-08: 3 [IU] via SUBCUTANEOUS

## 2024-02-08 NOTE — TOC CAGE-AID Note (Signed)
 Transition of Care [facility]) - CAGE-AID Screening  Patient Details  Name: Anthony [name] MRN: [id number] Date of Birth: 1950/09/20  Clinical Narrative:  Patient denies any current alcohol or drug use, substance abuse resources not needed at this time.  CAGE-AID Screening:   Have You Ever Felt You Ought to Cut Down on Your Drinking or Drug Use?: No Have People Annoyed You By Critizing Your Drinking Or Drug Use?: No Have You Felt Bad Or Guilty About Your Drinking Or Drug Use?: No Have You Ever Had a Drink or Used Drugs First Thing In The Morning to Steady Your Nerves or to Get Rid of a Hangover?: No CAGE-AID Score: 0  Substance Abuse Education Offered: No

## 2024-02-08 NOTE — Plan of Care (Signed)

## 2024-02-08 NOTE — TOC Progression Note (Signed)
 Transition of Care [facility]) - Progression Note    Patient Details  Name: Anthony [name] MRN: [id number] Date of Birth: 11/19/1950  Transition of [facility]) CM/SW Contact  Nil Bolser [place], [place] Phone Number: 02/08/2024, 7:44 AM  Clinical Narrative:  SW following as pt admitted from [facility] with the plan to return pending new [employer] auth. PT recommending HHPT, pt ambulated 220' CGA. CM made aware of HH needs. SW available if SNF becomes appropriate dc plan.    Julien Das, MSW, LCSW [phone] (coverage)       Expected Discharge Plan: Skilled Nursing Facility Barriers to Discharge: Continued Medical Work up, English as a second language teacher               Expected Discharge Plan and Services In-house Referral: Clinical Social Work     Living arrangements for the past 2 months: Single Family Home                                       Social Drivers of Health (SDOH) Interventions SDOH Screenings   Food Insecurity: No Food Insecurity (02/06/2024)  Housing: Low Risk  (02/06/2024)  Transportation Needs: No Transportation Needs (02/06/2024)  Utilities: Not At Risk (02/06/2024)  Alcohol Screen: Low Risk  (08/21/2023)  Depression (PHQ2-9): Low Risk  (10/29/2023)  Financial Resource Strain: Low Risk  (08/21/2023)  Physical Activity: Sufficiently Active (08/21/2023)  Social Connections: Socially Integrated (02/06/2024)  Stress: No Stress Concern Present (08/21/2023)  Tobacco Use: Medium Risk (01/26/2024)  Health Literacy: Adequate Health Literacy (08/21/2023)    Readmission Risk Interventions     No data to display

## 2024-02-08 NOTE — Progress Notes (Signed)
 RNCM received HH orders for PT/OT/RN.  RNCM spoke to patient's wife and offered choice for [facility] services.  Patient's wife with no preference, so Joane at [facility] contacted with orders and confirmation received. Patient/Patient's wife in agreement to services.

## 2024-02-08 NOTE — Hospital Course (Addendum)
 Anthony [name] is a 73 y.o. male who was admitted to the [facility] Medicine Teaching Service at [facility] for altered mental status. Hospital course is outlined below by problem.   Altered mental status Hypoglycemia in T2DM Left rib fractures #9-11 Presented from SNF after being found on the floor and confused.  CBG 28.  A1c 13.3.  CT head and C-spine notable for right scalp hematoma without acute intracranial abnormality.  AMS felt to be due to hypoglycemia questionably after decreased p.o. intake with insulin  administration.  AMS quickly improved over admission and glucoses were titrated.  He was given pain management for scalp hematoma and rib fractures.  He was discharged with home health services.***  Other conditions that were chronic and stable: CKD, constipation  Issues for follow up: Ensure appropriate insulin  dosing Assess recurrence of altered mental status

## 2024-02-08 NOTE — Progress Notes (Signed)
 Mobility Specialist Progress Note:    02/08/24 1458  Mobility  Activity Ambulated with assistance (In hallway)  Level of Assistance Contact guard assist, steadying assist  Assistive Device Front wheel walker  Distance Ambulated (ft) 150 ft  Activity Response Tolerated well  Mobility Referral Yes  Mobility visit 1 Mobility  Mobility Specialist Start Time (ACUTE ONLY) 1340  Mobility Specialist Stop Time (ACUTE ONLY) 1351  Mobility Specialist Time Calculation (min) (ACUTE ONLY) 11 min   Received pt in bed and agreeable to mobility. No physical assistance needed. No c/o. Returned to room without fault. Left pt in bed with alarm on. Personal belongings and call light within reach. All needs met.   Lavanda Pollack Mobility Specialist  Please contact via [employer] or  Rehab Office [phone]

## 2024-02-09 ENCOUNTER — Other Ambulatory Visit (HOSPITAL_COMMUNITY): Payer: Self-pay

## 2024-02-09 LAB — CBC
HCT: 31.7 % — ABNORMAL LOW (ref 39.0–52.0)
Hemoglobin: 10.4 g/dL — ABNORMAL LOW (ref 13.0–17.0)
MCH: 29.5 pg (ref 26.0–34.0)
MCHC: 32.8 g/dL (ref 30.0–36.0)
MCV: 89.8 fL (ref 80.0–100.0)
Platelets: 193 K/uL (ref 150–400)
RBC: 3.53 MIL/uL — ABNORMAL LOW (ref 4.22–5.81)
RDW: 14.5 % (ref 11.5–15.5)
WBC: 6.1 K/uL (ref 4.0–10.5)
nRBC: 0 % (ref 0.0–0.2)

## 2024-02-09 LAB — BASIC METABOLIC PANEL WITH GFR
Anion gap: 10 (ref 5–15)
BUN: 25 mg/dL — ABNORMAL HIGH (ref 8–23)
CO2: 26 mmol/L (ref 22–32)
Calcium: 9 mg/dL (ref 8.9–10.3)
Chloride: 102 mmol/L (ref 98–111)
Creatinine, Ser: 2.3 mg/dL — ABNORMAL HIGH (ref 0.61–1.24)
GFR, Estimated: 29 mL/min — ABNORMAL LOW (ref >60)
Glucose, Bld: 191 mg/dL — ABNORMAL HIGH (ref 70–99)
Potassium: 3.5 mmol/L (ref 3.5–5.1)
Sodium: 138 mmol/L (ref 135–145)

## 2024-02-09 LAB — GLUCOSE, CAPILLARY
Glucose-Capillary: 193 mg/dL — ABNORMAL HIGH (ref 70–99)
Glucose-Capillary: 166 mg/dL — ABNORMAL HIGH (ref 70–99)
Glucose-Capillary: 236 mg/dL — ABNORMAL HIGH (ref 70–99)

## 2024-02-09 MED ORDER — BLOOD GLUCOSE TEST VI STRP
1.0000 | ORAL_STRIP | Freq: Three times a day (TID) | 0 refills | Status: AC
  Filled 2024-02-09: qty 100, 30d supply, fill #0

## 2024-02-09 MED ORDER — INSULIN PEN NEEDLE 32G X 4 MM MISC
1.0000 | Freq: Three times a day (TID) | 0 refills | Status: DC
  Filled 2024-02-09: qty 100, 30d supply, fill #0

## 2024-02-09 MED ORDER — BLOOD GLUCOSE MONITOR SYSTEM W/DEVICE KIT
1.0000 | PACK | Freq: Three times a day (TID) | 0 refills | Status: AC
  Filled 2024-02-09: qty 1, 30d supply, fill #0

## 2024-02-09 MED ORDER — LANCET DEVICE MISC
1.0000 | Freq: Three times a day (TID) | 0 refills | Status: AC
  Filled 2024-02-09: qty 1, fill #0

## 2024-02-09 MED ORDER — TRESIBA FLEXTOUCH 100 UNIT/ML ~~LOC~~ SOPN
10.0000 [IU] | PEN_INJECTOR | SUBCUTANEOUS | 0 refills | Status: DC
  Filled 2024-02-09: qty 9, 90d supply, fill #0

## 2024-02-09 MED ORDER — ONETOUCH DELICA PLUS LANCET33G MISC
1.0000 | Freq: Three times a day (TID) | 0 refills | Status: AC
  Filled 2024-02-09: qty 100, 30d supply, fill #0

## 2024-02-09 MED ORDER — INSULIN ASPART 100 UNIT/ML FLEXPEN
5.0000 [IU] | PEN_INJECTOR | Freq: Every day | SUBCUTANEOUS | 0 refills | Status: DC
  Filled 2024-02-09: qty 9, 84d supply, fill #0
  Filled 2024-02-09: qty 3, 28d supply, fill #0

## 2024-02-09 NOTE — Discharge Instructions (Signed)
 Dear Anthony [name],  Thank you for letting us  participate in your care. You were hospitalized for confusion and diagnosed with Hypoglycemia. You were treated with pain medications and changing of your insulin  regimen.   POST-HOSPITAL & CARE INSTRUCTIONS Be sure to take all medications as prescribed in this packet. Go to your follow up appointments (listed below)  DOCTOR'S APPOINTMENT   Future Appointments  Date Time Provider Department Center  02/11/2024 11:00 AM Amalia Maude MATSU, RPH-CPP [facility]  02/16/2024  8:50 AM Madelon Donald HERO, DO [facility]  03/01/2024  7:00 AM CVD HVT DEVICE REMOTES [facility]  08/23/2024  3:00 PM [facility] ANNUAL WELLNESS VISIT [facility]    Follow-up Information     Care, [facility] Follow up.   Specialty: Home Health Services Contact information: [address] [place] [address] [phone]         Care, [facility] Follow up.   Why: [employer] will deliver a Rw and bedside commode to your hospital room before you are discharged home. Contact information: [address] [place] [address] [phone]         [facility] Follow up on 02/11/2024.   Why: at 95 AM with Dr. Koval to discuss insulin  Contact information: [address] [place]  [address] [phone]        Madelon Donald HERO, DO Follow up on 02/16/2024.   Specialty: Family Medicine Why: at 8:50 AM for hospital follow up Contact information: [address] [place] [address] [phone]                 Take care and be well!  Family Medicine Teaching Service Inpatient Team [place]  [facility]  [address] [place], [place] [address] [phone]

## 2024-02-09 NOTE — Progress Notes (Addendum)
     Daily Progress Note Intern Pager: [phone]  Patient name: Anthony [name] Medical record number: [id number] Date of birth: 09-13-1950 Age: 73 y.o. Gender: male  Primary Care Provider: Madelon Donald HERO, DO Consultants: None Code Status: Full code   Pt Overview and Major Events to Date:  9/12: Admitted   Assessment and Plan:  Anthony [name] is a 73 y.o. male admitted for syncopal episode causing multiple rib fractures most likely 2/2 to hypoglycemia. Pertinent PMH/PSH includes T2DM, CKD5, A-fib, OSA, HFpEF.  Assessment & Plan Insulin  dependent type 2 diabetes mellitus (HCC) A1c 13.3 this admission.Home medications: 34 units Tresiba , 0-10 units Humalog  - Continue SSI from VS > sensitive  and QH coverage - Continue Lantus  10 units (started 9/13)   Rib fractures Scalp hematoma Syncope Stable.   - Tylenol  1000 mg PO Q6H PRN for pain - Incentive spirometry  - PT/OT following Chronic health problem Chronic Back Pain: Continue home Gabapentin  BPH: Continuehome Flomax  0.4 mg daily HLD: Continue home Atorvastatin  40 mg daily GERD: Continue Protonix  40 mg  OSA: CPAP nightly CKD 5: Continue calcitriol . Avoid nephrotoxic agents. A-fib: Continue home Eliquis  5 mg twice daily, Lopressor  12.5 mg BID HTN: Continue holding home hydralazine  50 mg 3 times daily and Hyzaar 50-12.5 mg daily HFpEF: Lasix  60 mg daily Constipation: Continue senna daily Depression: Continue Paxil  30 mg daily   FEN/GI: Carb modified  PPx: Eliquis  5 mg BID Dispo:Home w/ HHPT  Subjective:  Doing well this morning. Still c/o aching and stiffness on his left side at where he fall. States doing well and had a bowel movement yesterday. Denies vision change, dizziness, lightheaded.   Objective: Temp:  [97.8 F (36.6 C)-98.6 F (37 C)] 98.3 F (36.8 C) (09/15 0754) Pulse Rate:  [70] 70 (09/15 0754) Resp:  [16-19] 18 (09/15 0754) BP: (122-154)/(65-100) 122/65 (09/15 0754) SpO2:  [98 %-100 %] 98 % (09/15  0754) FiO2 (%):  [21 %] 21 % (09/14 2324) Physical Exam: General: Chronically ill-appearing, no acute distress Cardio: RRR, no murmur on exam Pulm: clear, No increased work of breathing Abdomen: Soft, bowel sounds present, nontender Extremity: No peripheral edema   Laboratory: Most recent CBC Lab Results  Component Value Date   WBC 6.1 02/09/2024   HGB 10.4 (L) 02/09/2024   HCT 31.7 (L) 02/09/2024   MCV 89.8 02/09/2024   PLT 193 02/09/2024   Most recent BMP    Latest Ref Rng & Units 02/09/2024    4:49 AM  BMP  Glucose 70 - 99 mg/dL 808   BUN 8 - 23 mg/dL 25   Creatinine 9.38 - 1.24 mg/dL 7.69   Sodium 864 - 854 mmol/L 138   Potassium 3.5 - 5.1 mmol/L 3.5   Chloride 98 - 111 mmol/L 102   CO2 22 - 32 mmol/L 26   Calcium  8.9 - 10.3 mg/dL 9.0    Imaging/Diagnostic Tests: No new imaging.   Suzen Elder B, DO 02/09/2024, 8:16 AM  PGY-1, [facility] Family Medicine FPTS Intern pager: [phone], text pages welcome Secure chat group [facility] Teaching Service

## 2024-02-09 NOTE — Plan of Care (Signed)

## 2024-02-09 NOTE — Assessment & Plan Note (Signed)
 A1c 13.3 this admission.Home medications: 34 units Tresiba , 0-10 units Humalog  - Continue SSI from VS > sensitive  and QH coverage - Continue Lantus  10 units (started 9/13)

## 2024-02-09 NOTE — Discharge Summary (Addendum)
 Family Medicine Teaching [facility] Discharge Summary  Patient name: Anthony [name] Medical record number: [id number] Date of birth: 12-Aug-1950 Age: 73 y.o. Gender: male Date of Admission: 02/06/2024  Date of Discharge: 02/09/24 Admitting Physician: Twyla Nearing, MD  Primary Care Provider: Madelon Donald HERO, DO Consultants: None  Indication for Hospitalization: Fall and Syncope   Discharge Diagnoses/Problem List:  Principal Problem for Admission:  Other Problems addressed during stay:  Active Problems:   Insulin  dependent type 2 diabetes mellitus (HCC)   HTN (hypertension)   Scalp hematoma   Rib fractures   Syncope  Brief Hospital Course:  LEANARD [name] is a 73 y.o. male who was admitted to the [facility] Medicine Teaching Service at [facility] for altered mental status. Hospital course is outlined below by problem.   Altered mental status Hypoglycemia in T2DM Left rib fractures #9-11 Presented from SNF after being found on the floor s/p fall and was confused.  CBG 28 on admission and A1c 13.3.  CT head and C-spine notable for right scalp hematoma without acute intracranial abnormality.  AMS felt to be due to hypoglycemia with insulin  administration likely secondary to having labile sugars in the setting of CKD.  AMS quickly improved over admission and insulin  was titrated to glucoses as able.  Insulin  regimen was modified to Degludec 10 units daily and Novolog  5 units daily with the largest meal and if BG > 80.  He was given pain management for scalp hematoma and rib fractures during admission, which improved prior to discharge.  No longer met criteria for SNF, so was discharged with home health services.  Other conditions that were chronic and stable: CKD, BPH, HLD, GERD, A-fib, HTN, HFpEF, Constipation, Depression, Chronic back pain  Issues for follow up: Ensure appropriate insulin  dosing and adjust as needed. Assess recurrence of altered mental status.    Results/Tests Pending  at Time of Discharge:  Unresulted Labs (From admission, onward)    None       Disposition: Home  Discharge Condition: Good  Discharge Exam:  Vitals:   02/09/24 0431 02/09/24 0754  BP: (!) 146/100 122/65  Pulse: 70 70  Resp:  18  Temp: 98.2 F (36.8 C) 98.3 F (36.8 C)  SpO2: 99% 98%   General: Chronically ill-appearing, no acute distress Cardio: RRR, no murmur on exam Pulm: clear, No increased work of breathing Abdomen: Soft, bowel sounds present, nontender Extremity: No peripheral edema   Per Dr. Coralee  Significant Procedures: None  Significant Labs and Imaging:  Recent Labs  Lab 02/08/24 0530 02/09/24 0449  WBC 6.3 6.1  HGB 10.9* 10.4*  HCT 33.0* 31.7*  PLT 190 193   Recent Labs  Lab 02/08/24 0530 02/09/24 0449  NA 138 138  K 3.7 3.5  CL 103 102  CO2 24 26  GLUCOSE 244* 191*  BUN 29* 25*  CREATININE 2.25* 2.30*  CALCIUM  9.0 9.0      Discharge Medications:  Allergies as of 02/09/2024       Reactions   Enalapril Maleate Cough        Medication List     PAUSE taking these medications    hydrALAZINE  50 MG tablet Wait to take this until your doctor or other care provider tells you to start again. Commonly known as: APRESOLINE  Take 1 tablet (50 mg total) by mouth 3 (three) times daily.   losartan -hydrochlorothiazide 50-12.5 MG tablet Wait to take this until your doctor or other care provider tells you to start again.  Commonly known as: HYZAAR TAKE 1 TABLET BY MOUTH EVERY DAY       STOP taking these medications    insulin  lispro 100 UNIT/ML injection Commonly known as: HUMALOG   Oxycodone  HCl 10 MG Tabs       TAKE these medications    acetaminophen  650 MG CR tablet Commonly known as: TYLENOL  Take 1,300 mg by mouth 2 (two) times daily as needed for pain.   apixaban  5 MG Tabs tablet Commonly known as: Eliquis  Take 1 tablet (5 mg total) by mouth 2 (two) times daily.   atorvastatin  40 MG tablet Commonly known as:  LIPITOR Take 40 mg by mouth daily.   baclofen 10 MG tablet Commonly known as: LIORESAL Take 10 mg by mouth every 6 (six) hours as needed for muscle spasms.   Blood Glucose Monitor System w/Device Kit Use 3 (three) times daily.   BLOOD GLUCOSE TEST STRIPS Strp Use 3 (three) times daily. Use as directed to check blood sugar.   calcitRIOL  0.5 MCG capsule Commonly known as: ROCALTROL  Take 0.5 mcg by mouth daily.   dorzolamide -timolol  2-0.5 % ophthalmic solution Commonly known as: COSOPT  Place 1 drop into both eyes 2 (two) times daily.   feeding supplement Liqd Take 237 mLs by mouth 2 (two) times daily between meals.   furosemide  20 MG tablet Commonly known as: LASIX  Take 3 tablets (60 mg total) by mouth daily.   gabapentin  100 MG capsule Commonly known as: NEURONTIN  Take 2 capsules (200 mg total) by mouth every 8 (eight) hours.   insulin  aspart 100 UNIT/ML FlexPen Commonly known as: NOVOLOG  Inject 5 Units into the skin daily. Only take if eating a meal AND Blood Glucose (BG) is 80 or higher. WITH THE LARGEST MEAL. Pen expires 28 days after first use   Insulin  Pen Needle 32G X 4 MM Misc Use 3 (three) times daily. May dispense any manufacturer covered by patient's insurance.   Lancet Device Misc Use 3 (three) times daily. May dispense any manufacturer covered by patient's insurance.   latanoprost  0.005 % ophthalmic solution Commonly known as: XALATAN  Place 1 drop into both eyes at bedtime.   lidocaine  5 % Commonly known as: LIDODERM  Place 1 patch onto the skin daily. Remove & Discard patch within 12 hours or as directed by MD   loratadine  10 MG tablet Commonly known as: CLARITIN  Take 10 mg by mouth daily.   metoprolol  tartrate 25 MG tablet Commonly known as: LOPRESSOR  Take 0.5 tablets (12.5 mg total) by mouth 2 (two) times daily.   multivitamin tablet Take 1 tablet by mouth daily.   nitroGLYCERIN  0.4 MG SL tablet Commonly known as: NITROSTAT  Place 1 tablet  (0.4 mg total) under the tongue every 5 (five) minutes as needed for chest pain. If no relief by 3rd tab, call 911   omeprazole  20 MG capsule Commonly known as: PRILOSEC Take 20 mg by mouth daily.   OneTouch Delica Plus Lancet33G Misc Use 1 each 3 (three) times daily. Use as directed to check blood sugar.   PARoxetine  30 MG tablet Commonly known as: PAXIL  TAKE 1 TABLET BY MOUTH EVERY DAY   polyethylene glycol 17 g packet Commonly known as: MIRALAX  / GLYCOLAX  Take 17 g by mouth once.   senna 8.6 MG Tabs tablet Commonly known as: SENOKOT Take 1 tablet by mouth daily.   tamsulosin  0.4 MG Caps capsule Commonly known as: FLOMAX  TAKE 1 CAPSULE BY MOUTH EVERYDAY AT BEDTIME   Tresiba  FlexTouch 100 UNIT/ML FlexTouch Pen Generic  drug: insulin  degludec Inject 10 Units into the skin daily. What changed:  medication strength how much to take               Durable Medical Equipment  (From admission, onward)           Start     Ordered   02/09/24 1011  For home use only DME Bedside commode  Once       Comments: 3:1 is needed at the bedside at home due to patient's inability to mobilize to bathroom  Question:  Patient needs a bedside commode to treat with the following condition  Answer:  Syncope   02/09/24 1010   02/09/24 1010  For home use only DME Walker rolling  Once       Question Answer Comment  Walker: With 5 Inch Wheels   Patient needs a walker to treat with the following condition Syncope      02/09/24 1010           Discharge Instructions: Please refer to Patient Instructions section of EMR for full details.  Patient was counseled important signs and symptoms that should prompt return to medical care, changes in medications, dietary instructions, activity restrictions, and follow up appointments.   Follow-Up Appointments:   Follow-up Information     Care, [facility] Follow up.   Specialty: Home Health Services Contact information: [address] [place] [address] [phone]         Care, [facility] Follow up.   Why: [employer] will deliver a Rw and bedside commode to your hospital room before you are discharged home. Contact information: [address] [place] [address] [phone]         [facility] Follow up on 02/11/2024.   Why: at 14 AM with Dr. Koval to discuss insulin  Contact information: [address] [place]  [address] [phone]        Madelon Donald HERO, DO Follow up on 02/16/2024.   Specialty: Family Medicine Why: at 8:50 AM for hospital follow up Contact information: [address] [place] [address] [phone]                  Suzen Houston NOVAK, DO 02/09/2024, 8:24 AM PGY-1, [facility] Family Medicine   I have discussed the above with Dr. Coralee and agree with the documented plan. My edits for correction/addition/clarification are included above. Please see any attending notes.   Kathrine Melena, DO PGY-2, [facility] 02/09/2024 1:07 PM

## 2024-02-09 NOTE — Assessment & Plan Note (Signed)
 Stable.   - Tylenol  1000 mg PO Q6H PRN for pain - Incentive spirometry  - PT/OT following

## 2024-02-09 NOTE — Progress Notes (Signed)
    Durable Medical Equipment  (From admission, onward)           Start     Ordered   02/09/24 1011  For home use only DME Bedside commode  Once       Comments: 3:1 is needed at the bedside at home due to patient's inability to mobilize to bathroom  Question:  Patient needs a bedside commode to treat with the following condition  Answer:  Syncope   02/09/24 1010   02/09/24 1010  For home use only DME Walker rolling  Once       Question Answer Comment  Walker: With 5 Inch Wheels   Patient needs a walker to treat with the following condition Syncope      02/09/24 1010

## 2024-02-09 NOTE — Assessment & Plan Note (Signed)
 Chronic Back Pain: Continue home Gabapentin  BPH: Continuehome Flomax  0.4 mg daily HLD: Continue home Atorvastatin  40 mg daily GERD: Continue Protonix  40 mg  OSA: CPAP nightly CKD 5: Continue calcitriol . Avoid nephrotoxic agents. A-fib: Continue home Eliquis  5 mg twice daily, Lopressor  12.5 mg BID HTN: Continue holding home hydralazine  50 mg 3 times daily and Hyzaar 50-12.5 mg daily HFpEF: Lasix  60 mg daily Constipation: Continue senna daily Depression: Continue Paxil  30 mg daily

## 2024-02-09 NOTE — Progress Notes (Signed)
 Initial Nutrition Assessment  DOCUMENTATION CODES:   Not applicable  INTERVENTION:  -Continue carb modified diet order -Continue to discuss DM2 diet recommendations -Referral to outpatient RD   NUTRITION DIAGNOSIS:   Limited adherence to nutrition-related recommendations related to limited prior education as evidenced by per patient/family report.  GOAL:   Patient will meet greater than or equal to 90% of their needs   MONITOR:   Weight trends, PO intake, Supplement acceptance, Labs, Skin  REASON FOR ASSESSMENT:   Malnutrition Screening Tool    ASSESSMENT:   Hx insulin -dependent DM2 with hypotension, hyperglycemia, weakness, and decreased appetite in the setting of insulin  non-adherence. Patient reports stopping insulin  approximately one week prior to admission due to his back pain, which is the likely cause for precipitating HHS.  Spoke to pt at bedside. Pt denies n/v/c/d or chewing/swallowing difficulties. Last BM 9/13. Pt denies recent weight changes. Pt states improved appetite, documented PO intake 50% x 1 meal. Discussion regarding DM2 diet recommendations. Pt with poor recall of diet recommendations, does state some education in the past that he didn't understand. Discussed what DM2 is, what happens when carbohydrates are eaten, insulin  resistance, need for carbohydrates for energy. Discussed/encouraged carbohydrate consistency to help promote stable BG levels. Pt verbalized understanding. Pt getting ready for d/c today. Pt is agreeable to outpatient RD referral, denies additional questions/concerns at this time, will continue to monitor, RDN available prn.   Labs BG 166-271 BUN 25 Cr 2.30 Albumin 2.5 GFR 29 H/H 10.4/31.7 A1c 13.3  Medications  apixaban   5 mg Oral BID   atorvastatin   40 mg Oral Daily   calcitRIOL   0.5 mcg Oral Daily   dorzolamide -timolol   1 drop Both Eyes BID   feeding supplement  237 mL Oral BID BM   furosemide   60 mg Oral Daily    gabapentin   200 mg Oral Q8H   insulin  aspart  0-5 Units Subcutaneous QHS   insulin  aspart  0-9 Units Subcutaneous TID WC   insulin  glargine  10 Units Subcutaneous Daily   latanoprost   1 drop Both Eyes QHS   metoprolol  tartrate  12.5 mg Oral BID   pantoprazole   40 mg Oral Daily   PARoxetine   30 mg Oral Daily   senna  1 tablet Oral Daily   tamsulosin   0.4 mg Oral Daily    Diet Order:   Diet Order             Diet Carb Modified Fluid consistency: Thin; Room service appropriate? Yes  Diet effective now                   EDUCATION NEEDS:   Education needs have been addressed  Skin:  Skin Assessment: Reviewed RN Assessment  Last BM:  9/13  Height:   Ht Readings from Last 1 Encounters:  02/06/24 6' (1.829 m)    Weight:   Wt Readings from Last 1 Encounters:  02/06/24 82.1 kg    BMI:  Body mass index is 24.55 kg/m.  Estimated Nutritional Needs:   Kcal:  1650-2050 kcal  Protein:  100-125 g  Fluid:  >/=1.6 L  Lafaye Mcelmurry Daml-Budig, RDN, LDN Registered Dietitian Nutritionist RD Inpatient Contact Info in [place]

## 2024-02-09 NOTE — TOC Transition Note (Signed)
 Transition of Care [facility]) - Discharge Note   Patient Details  Name: Anthony [name] MRN: [id number] Date of Birth: [birth year]  [facility] CM/SW Contact:  Rosaline JONELLE Joe, RN Phone Number: 02/09/2024, 10:15 AM   Clinical Narrative:    CM met with the patient at the bedside to discuss IP Care management needs for patient to return home today.  Patient has CPAP at home but will need bedside commode and RW delivered to the bedside prior to discharge.   DMe orders for RW and 3:1 placed.  Patient is set up with [facility] - HH orders for RN, PT, OT placed - to be co-signed by MD.  Patient was offered choice regarding home DMe company and patient did not have a preference.  [employer] was called to deliver RW and 3:1 to the bedside today prior to discharge to home.  Bedside nursing is aware.     Barriers to Discharge: Continued Medical Work up, English as a second language teacher   Patient Goals and CMS Choice            Discharge Placement                       Discharge Plan and Services Additional resources added to the After Visit Summary for   In-house Referral: Clinical Social Work                                   Social Drivers of Health (SDOH) Interventions SDOH Screenings   Food Insecurity: No Food Insecurity (02/06/2024)  Housing: Low Risk  (02/06/2024)  Transportation Needs: No Transportation Needs (02/06/2024)  Utilities: Not At Risk (02/06/2024)  Alcohol Screen: Low Risk  (08/21/2023)  Depression (PHQ2-9): Low Risk  (10/29/2023)  Financial Resource Strain: Low Risk  (08/21/2023)  Physical Activity: Sufficiently Active (08/21/2023)  Social Connections: Socially Integrated (02/06/2024)  Stress: No Stress Concern Present (08/21/2023)  Tobacco Use: Medium Risk (01/26/2024)  Health Literacy: Adequate Health Literacy (08/21/2023)     Readmission Risk Interventions    02/09/2024   10:15 AM  Readmission Risk Prevention Plan  Transportation Screening  Complete  PCP or Specialist Appt within 3-5 Days Complete  HRI or Home Care Consult Complete  Social Work Consult for Recovery Care Planning/Counseling Complete  Palliative Care Screening Complete  Medication Review Oceanographer) Referral to Pharmacy

## 2024-02-09 NOTE — Plan of Care (Signed)
 Saw patient at bedside to explain new insulin  regimen.  Use the teach back method to ensure understanding.  He is in agreement that he will be taking the 10 units of Tresiba  in the morning and reports that his biggest meal of the day is dinner and will take 5 units of NovoLog  with dinner.  Also instructed patient that we will he will have a follow-up with Dr. Ruthann at the [facility] to discuss further diabetes management.  Patient was very interested in getting insulin  pump.

## 2024-02-09 NOTE — Care Management Important Message (Signed)
 Important Message  Patient Details  Name: Anthony [name] MRN: [id number] Date of Birth: 1950/12/14   Important Message Given:        Claretta Deed 02/09/2024, 12:54 PM

## 2024-02-09 NOTE — Care Management Important Message (Signed)
 Important Message  Patient Details  Name: Anthony [name] MRN: [id number] Date of Birth: 1951-02-11   Important Message Given:  Yes - Medicare IM     Claretta Deed 02/09/2024, 3:36 PM

## 2024-02-11 ENCOUNTER — Telehealth: Payer: Self-pay | Admitting: Pharmacist

## 2024-02-11 ENCOUNTER — Ambulatory Visit: Admitting: Pharmacist

## 2024-02-11 DIAGNOSIS — I4821 Permanent atrial fibrillation: Secondary | ICD-10-CM | POA: Diagnosis not present

## 2024-02-11 DIAGNOSIS — E1100 Type 2 diabetes mellitus with hyperosmolarity without nonketotic hyperglycemic-hyperosmolar coma (NKHHC): Secondary | ICD-10-CM | POA: Diagnosis not present

## 2024-02-11 DIAGNOSIS — R2689 Other abnormalities of gait and mobility: Secondary | ICD-10-CM | POA: Diagnosis not present

## 2024-02-11 DIAGNOSIS — E113293 Type 2 diabetes mellitus with mild nonproliferative diabetic retinopathy without macular edema, bilateral: Secondary | ICD-10-CM | POA: Diagnosis not present

## 2024-02-11 NOTE — Telephone Encounter (Signed)
 Attempted to contact patient for follow-up of missed appointment on 9/17    Total time with patient call and documentation of interaction: 1 minutes.  Follow-up phone call planned: in the next 24 hrs

## 2024-02-12 ENCOUNTER — Telehealth: Payer: Self-pay | Admitting: Family Medicine

## 2024-02-12 NOTE — Telephone Encounter (Signed)
 Patient was identified as falling into the [place] Measure - Diabetes.   Patient was: Appointment already scheduled for:  02/16/24.

## 2024-02-14 ENCOUNTER — Other Ambulatory Visit: Payer: Self-pay | Admitting: Family Medicine

## 2024-02-14 DIAGNOSIS — I10 Essential (primary) hypertension: Secondary | ICD-10-CM

## 2024-02-14 DIAGNOSIS — R931 Abnormal findings on diagnostic imaging of heart and coronary circulation: Secondary | ICD-10-CM

## 2024-02-14 DIAGNOSIS — Z95810 Presence of automatic (implantable) cardiac defibrillator: Secondary | ICD-10-CM

## 2024-02-14 DIAGNOSIS — I428 Other cardiomyopathies: Secondary | ICD-10-CM

## 2024-02-14 DIAGNOSIS — I5022 Chronic systolic (congestive) heart failure: Secondary | ICD-10-CM

## 2024-02-14 DIAGNOSIS — R0609 Other forms of dyspnea: Secondary | ICD-10-CM

## 2024-02-14 DIAGNOSIS — I639 Cerebral infarction, unspecified: Secondary | ICD-10-CM

## 2024-02-14 DIAGNOSIS — I4821 Permanent atrial fibrillation: Secondary | ICD-10-CM

## 2024-02-14 DIAGNOSIS — I2510 Atherosclerotic heart disease of native coronary artery without angina pectoris: Secondary | ICD-10-CM

## 2024-02-14 DIAGNOSIS — E782 Mixed hyperlipidemia: Secondary | ICD-10-CM

## 2024-02-14 DIAGNOSIS — I5032 Chronic diastolic (congestive) heart failure: Secondary | ICD-10-CM

## 2024-02-16 ENCOUNTER — Encounter: Payer: Self-pay | Admitting: Family Medicine

## 2024-02-16 ENCOUNTER — Ambulatory Visit (INDEPENDENT_AMBULATORY_CARE_PROVIDER_SITE_OTHER): Admitting: Family Medicine

## 2024-02-16 VITALS — BP 105/47 | HR 70 | Wt 193.0 lb

## 2024-02-16 DIAGNOSIS — I10 Essential (primary) hypertension: Secondary | ICD-10-CM

## 2024-02-16 DIAGNOSIS — F1190 Opioid use, unspecified, uncomplicated: Secondary | ICD-10-CM

## 2024-02-16 DIAGNOSIS — N184 Chronic kidney disease, stage 4 (severe): Secondary | ICD-10-CM

## 2024-02-16 DIAGNOSIS — I4821 Permanent atrial fibrillation: Secondary | ICD-10-CM

## 2024-02-16 DIAGNOSIS — R194 Change in bowel habit: Secondary | ICD-10-CM

## 2024-02-16 DIAGNOSIS — D649 Anemia, unspecified: Secondary | ICD-10-CM | POA: Insufficient documentation

## 2024-02-16 DIAGNOSIS — R195 Other fecal abnormalities: Secondary | ICD-10-CM | POA: Diagnosis not present

## 2024-02-16 DIAGNOSIS — E1100 Type 2 diabetes mellitus with hyperosmolarity without nonketotic hyperglycemic-hyperosmolar coma (NKHHC): Secondary | ICD-10-CM | POA: Diagnosis not present

## 2024-02-16 LAB — HEMOCCULT GUIAC POC 1CARD (OFFICE): Fecal Occult Blood, POC: NEGATIVE

## 2024-02-16 MED ORDER — FREESTYLE LIBRE 3 PLUS SENSOR MISC: 11 refills | Status: AC

## 2024-02-16 NOTE — Assessment & Plan Note (Signed)
 Reported CBGs at goal, no changes. CGM sensors sent in. F/u 1 wk.

## 2024-02-16 NOTE — Progress Notes (Signed)
   SUBJECTIVE:   CHIEF COMPLAINT / HPI:   HOSPITAL FOLLOW UP Hospital/facility: [facility]  Diagnosis: AMS 2/2 hypoglycemia, rib fractures (10 days after previous hospitalization for HHS with d/c to SNF) Procedures/tests:  - CT head, C Spine - R scalp hematoma, NAICA - CT lumbar spine - b/l foraminal stenosis L3-4, progression to grade 2 spondylolisthesis at L4-5 Consultants:  New medications:  - degludec 10u daily, novolog  5u daily with largest meal and if CBG >80 - coreg  changed to metoprolol  12.5 BID - gabapentin  decreased to 200mg  TID - hydralazine , losartan -hydrochlorothiazide paused. Discharge instructions:  HH Status: stable - gabapentin  making sleepy. Wanting something else for pain. - no lightheadedness - takes oxycodone  10mg  TID for back pain, Mliss Ravens at [facility]. - wife thinks the 2nd hospitlaization AMS was due to withdrawal, didn't get oxycodone  at SNF. - CBGs at home 107-290s. No missed doses of insulin . Out of sensors for CGM. - not checking BP at home. Did not realize he was supposed to stop antihypertensives. Has been taking hydralazine  and losartan -hydrochlorothiazide. - LLQ pain with dark, runny stools since he was in the hospital.   OBJECTIVE:   BP (!) 105/47   Pulse 70   Wt 193 lb (87.5 kg)   SpO2 100%   BMI 26.18 kg/m   Gen: well appearing, in NAD Card: irregular rhythm, regular rate Lungs: CTAB Ext: WWP, no edema Rectal exam: limited due to patient comfort, no appreciable mass or lesion, stool guaiac negative though difficult obtaining sample due to patient comfort.  ASSESSMENT/PLAN:   Type 2 diabetes mellitus with hyperosmolar hyperglycemic state (HHS) (HCC) Reported CBGs at goal, no changes. CGM sensors sent in. F/u 1 wk.  HTN (hypertension) Still low normal but without orthostatic symptoms. Stop hydralazine . Obtaining labs. F/u later this week.  Chronic kidney disease (CKD), stage IV (severe) (HCC) Recheck labs.  Anemia Hb 13.6>10.4 in a  few weeks with h/o dark, runny stools. Not UTD with colonoscopy. Rectal exam limited due to patient comfort and limited ability to obtain adequate hemoccult sample. Repeat CBC, urgent referral to GI.     Donald CHRISTELLA Lai, DO

## 2024-02-16 NOTE — Assessment & Plan Note (Signed)
 Hb 13.6>10.4 in a few weeks with h/o dark, runny stools. Not UTD with colonoscopy. Rectal exam limited due to patient comfort and limited ability to obtain adequate hemoccult sample. Repeat CBC, urgent referral to GI.

## 2024-02-16 NOTE — Assessment & Plan Note (Addendum)
 Still low normal but without orthostatic symptoms. Stop hydralazine . Obtaining labs. F/u later this week.

## 2024-02-16 NOTE — Telephone Encounter (Signed)
 Discontinued at previous hospitalization

## 2024-02-16 NOTE — Patient Instructions (Addendum)
 It was great to see you!  Our plans for today:  - We are referring you to Gastroenterology. Let us  know if you don't hear about an appointment in the next few weeks. It is very important you get this appointment. - Call Mliss Ravens with [facility] for an appointment. - We are sending more sensors for your continuous blood sugar monitor. Let me know if you have trouble getting this set up.   We are checking some labs today, we will release these results to your MyChart.  Take care and seek immediate care sooner if you develop any concerns.   Dr. Even Budlong

## 2024-02-16 NOTE — Assessment & Plan Note (Addendum)
 Recheck labs

## 2024-02-17 ENCOUNTER — Ambulatory Visit: Payer: Self-pay | Admitting: Family Medicine

## 2024-02-17 LAB — BASIC METABOLIC PANEL WITH GFR
BUN/Creatinine Ratio: 6 — ABNORMAL LOW (ref 10–24)
BUN: 13 mg/dL (ref 8–27)
CO2: 17 mmol/L — ABNORMAL LOW (ref 20–29)
Calcium: 8.8 mg/dL (ref 8.6–10.2)
Chloride: 100 mmol/L (ref 96–106)
Creatinine, Ser: 2.34 mg/dL — ABNORMAL HIGH (ref 0.76–1.27)
Glucose: 434 mg/dL — ABNORMAL HIGH (ref 70–99)
Potassium: 3.4 mmol/L — ABNORMAL LOW (ref 3.5–5.2)
Sodium: 138 mmol/L (ref 134–144)
eGFR: 29 mL/min/1.73 — ABNORMAL LOW (ref >59)

## 2024-02-17 LAB — CBC
Hematocrit: 40.4 % (ref 37.5–51.0)
Hemoglobin: 12.4 g/dL — ABNORMAL LOW (ref 13.0–17.7)
MCH: 28.8 pg (ref 26.6–33.0)
MCHC: 30.7 g/dL — ABNORMAL LOW (ref 31.5–35.7)
MCV: 94 fL (ref 79–97)
Platelets: 185 x10E3/uL (ref 150–450)
RBC: 4.31 x10E6/uL (ref 4.14–5.80)
RDW: 13.9 % (ref 11.6–15.4)
WBC: 6.5 x10E3/uL (ref 3.4–10.8)

## 2024-02-20 ENCOUNTER — Encounter: Payer: Self-pay | Admitting: Family Medicine

## 2024-02-20 ENCOUNTER — Encounter: Payer: Self-pay | Admitting: Gastroenterology

## 2024-02-20 ENCOUNTER — Telehealth: Payer: Self-pay

## 2024-02-20 ENCOUNTER — Ambulatory Visit (INDEPENDENT_AMBULATORY_CARE_PROVIDER_SITE_OTHER): Admitting: Family Medicine

## 2024-02-20 VITALS — BP 120/62 | HR 71 | Ht 72.0 in | Wt 189.2 lb

## 2024-02-20 DIAGNOSIS — E119 Type 2 diabetes mellitus without complications: Secondary | ICD-10-CM | POA: Diagnosis not present

## 2024-02-20 DIAGNOSIS — Z794 Long term (current) use of insulin: Secondary | ICD-10-CM

## 2024-02-20 DIAGNOSIS — D649 Anemia, unspecified: Secondary | ICD-10-CM

## 2024-02-20 DIAGNOSIS — I10 Essential (primary) hypertension: Secondary | ICD-10-CM | POA: Diagnosis not present

## 2024-02-20 DIAGNOSIS — Z23 Encounter for immunization: Secondary | ICD-10-CM

## 2024-02-20 LAB — GLUCOSE, POCT (MANUAL RESULT ENTRY): POC Glucose: 340 mg/dL — AB (ref 70–99)

## 2024-02-20 MED ORDER — TRESIBA FLEXTOUCH 100 UNIT/ML ~~LOC~~ SOPN: 12.0000 [IU] | PEN_INJECTOR | SUBCUTANEOUS | Status: DC

## 2024-02-20 NOTE — Telephone Encounter (Signed)
 Called patient per Dr. Roark request to inform him about the GI referral that was sent to [facility] and gave him the number to call to make an appointment.  Patient was appreciative.  Harlene Reiter, CMA

## 2024-02-20 NOTE — Progress Notes (Signed)
    SUBJECTIVE:   CHIEF COMPLAINT / HPI:   Hypertension: - Medications: hyzaar 50-12.5mg , metoprolol , lasix  - stopped hydralazine  at last visit - still taking coreg .  - Compliance: good - Checking BP at home: no but has cuff  Diabetes, Type 2 - Last A1c 13.3 01/2024 but with recent admission for hypoglycemia following [facility] admission - Medications: degludec 10u daily, novolog  5u daily with largest meal and if CBG >80  - Compliance: good - Checking BG at home: has dexcom but unable to get sensors. Unsure, didn't bring log, thinks in the 140s - Eye exam: UTD, has retinopathy - Foot exam: due - Microalbumin: UTD - Statin: yes - still with LOA. hasn't eaten much today. Ate a few crackers, took a bit of Sprite.  - no regurgitation of food, nausea, vomiting,  - some early satiety.     OBJECTIVE:   BP 120/62   Pulse 71   Ht 6' (1.829 m)   Wt 189 lb 3.2 oz (85.8 kg)   SpO2 100%   BMI 25.66 kg/m   Gen: well appearing, in NAD Card: RRR Lungs: CTAB Ext: WWP, no edema  ASSESSMENT/PLAN:   HTN (hypertension) Elevated but improved on recheck. No changes today. Instructed to monitor at home and bring home log to follow up.  Insulin  dependent type 2 diabetes mellitus (HCC) Remains uncontrolled. POC CBG today 340 with glucose on Monday's BMP 434.  Increase tresiba  to 12u, hesitate to increase more as question true compliance, suspect probable vascular dementia complicating picture.   Anemia With intermittent dark stools. Awaiting GI referral.   F/u 1 month.  Anthony CHRISTELLA Lai, DO

## 2024-02-20 NOTE — Assessment & Plan Note (Signed)
 With intermittent dark stools. Awaiting GI referral.

## 2024-02-20 NOTE — Patient Instructions (Signed)
 It was great to see you!  Our plans for today:  - Increase your long acting insulin  (degludec) to 12 units.  - Get your dexcom sensors from the pharmacy. Bring your reader with you to follow up.  - no changes to your blood pressure medications. Monitor your blood pressure at home and keep a log of your readings. Make sure to be seated for at least 5 minutes prior to testing and not in pain or worked up for the most accurate readings. Bring this log with you to follow up.  - Come back in 1 month for follow up.   Take care and seek immediate care sooner if you develop any concerns.   Dr. Madelon    Diet Recommendations for Diabetes   1. Eat at least 3 meals and 1-2 snacks per day. Never go more than 4-5 hours while awake without eating. Eat breakfast within the first hour of getting up.   2. Limit starchy foods to TWO per meal and ONE per snack. ONE portion of a starchy  food is equal to the following:   - ONE slice of bread (or its equivalent, such as half of a hamburger bun).   - 1/2 cup of a scoopable starchy food such as potatoes or rice.   - 15 grams of Total Carbohydrate as shown on food label.  3. Include at every meal: a protein food, a carb food, and vegetables and/or fruit.   - Obtain twice the volume of vegetables as protein or carbohydrate foods for both lunch and dinner.   - Fresh or frozen vegetables are best.   - Keep frozen vegetables on hand for a quick vegetable serving.       Starchy (carb) foods: Bread, rice, pasta, potatoes, corn, cereal, grits, crackers, bagels, muffins, all baked goods.  (Fruits, milk, and yogurt also have carbohydrate, but most of these foods will not spike your blood sugar as most starchy foods will.)  A few fruits do cause high blood sugars; use small portions of bananas (limit to 1/2 at a time), grapes, watermelon, oranges, and most tropical fruits.    Protein foods: Meat, fish, poultry, eggs, dairy foods, and beans such as pinto and kidney  beans (beans also provide carbohydrate).

## 2024-02-20 NOTE — Assessment & Plan Note (Signed)
 Elevated but improved on recheck. No changes today. Instructed to monitor at home and bring home log to follow up.

## 2024-02-20 NOTE — Assessment & Plan Note (Signed)
 Remains uncontrolled. POC CBG today 340 with glucose on Monday's BMP 434.  Increase tresiba  to 12u, hesitate to increase more as question true compliance, suspect probable vascular dementia complicating picture.

## 2024-02-20 NOTE — Telephone Encounter (Signed)
-----   Message from Anthony [name] sent at 02/20/2024  2:45 PM EDT ----- Hi team,  Can you call patient and let him (and his wife) know his GI referral was sent to [facility]. He can call and schedule an appt at [phone].  Thank you!

## 2024-03-01 ENCOUNTER — Ambulatory Visit (INDEPENDENT_AMBULATORY_CARE_PROVIDER_SITE_OTHER): Payer: PPO

## 2024-03-01 DIAGNOSIS — I4821 Permanent atrial fibrillation: Secondary | ICD-10-CM

## 2024-03-03 LAB — CUP PACEART REMOTE DEVICE CHECK
Battery Remaining Longevity: 66 mo
Battery Remaining Percentage: 72 %
Brady Statistic RA Percent Paced: 0 %
Brady Statistic RV Percent Paced: 95 %
Date Time Interrogation Session: 20251006030100
HighPow Impedance: 60 Ohm
Implantable Lead Connection Status: 753985
Implantable Lead Connection Status: 753985
Implantable Lead Implant Date: 20190301
Implantable Lead Implant Date: 20190301
Implantable Lead Location: 753858
Implantable Lead Location: 753860
Implantable Pulse Generator Implant Date: 20231222
Lead Channel Impedance Value: 587 Ohm
Lead Channel Impedance Value: 693 Ohm
Lead Channel Pacing Threshold Amplitude: 0.6 V
Lead Channel Pacing Threshold Pulse Width: 0.4 ms
Lead Channel Setting Pacing Amplitude: 2 V
Lead Channel Setting Pacing Amplitude: 4 V
Lead Channel Setting Pacing Pulse Width: 0.4 ms
Lead Channel Setting Pacing Pulse Width: 1.5 ms
Lead Channel Setting Sensing Sensitivity: 0.6 mV
Lead Channel Setting Sensing Sensitivity: 1 mV
Pulse Gen Serial Number: 285896

## 2024-03-04 ENCOUNTER — Other Ambulatory Visit: Payer: Self-pay

## 2024-03-04 ENCOUNTER — Other Ambulatory Visit: Payer: Self-pay | Admitting: Family Medicine

## 2024-03-04 DIAGNOSIS — I4821 Permanent atrial fibrillation: Secondary | ICD-10-CM

## 2024-03-04 DIAGNOSIS — I10 Essential (primary) hypertension: Secondary | ICD-10-CM

## 2024-03-04 DIAGNOSIS — I639 Cerebral infarction, unspecified: Secondary | ICD-10-CM

## 2024-03-04 DIAGNOSIS — Z95810 Presence of automatic (implantable) cardiac defibrillator: Secondary | ICD-10-CM

## 2024-03-04 DIAGNOSIS — I5022 Chronic systolic (congestive) heart failure: Secondary | ICD-10-CM

## 2024-03-04 DIAGNOSIS — I428 Other cardiomyopathies: Secondary | ICD-10-CM

## 2024-03-04 DIAGNOSIS — E119 Type 2 diabetes mellitus without complications: Secondary | ICD-10-CM

## 2024-03-04 DIAGNOSIS — I2510 Atherosclerotic heart disease of native coronary artery without angina pectoris: Secondary | ICD-10-CM

## 2024-03-04 DIAGNOSIS — I5032 Chronic diastolic (congestive) heart failure: Secondary | ICD-10-CM

## 2024-03-04 DIAGNOSIS — R931 Abnormal findings on diagnostic imaging of heart and coronary circulation: Secondary | ICD-10-CM

## 2024-03-04 DIAGNOSIS — E782 Mixed hyperlipidemia: Secondary | ICD-10-CM

## 2024-03-04 DIAGNOSIS — R0609 Other forms of dyspnea: Secondary | ICD-10-CM

## 2024-03-04 NOTE — Progress Notes (Signed)
 Remote ICD Transmission

## 2024-03-04 NOTE — Telephone Encounter (Signed)
 Stopped at previous hospitalization

## 2024-03-05 NOTE — Progress Notes (Signed)
 Remote ICD Transmission

## 2024-03-07 ENCOUNTER — Ambulatory Visit: Payer: Self-pay | Admitting: Cardiology

## 2024-03-11 ENCOUNTER — Telehealth: Payer: Self-pay | Admitting: Pharmacist

## 2024-03-11 DIAGNOSIS — E119 Type 2 diabetes mellitus without complications: Secondary | ICD-10-CM

## 2024-03-11 MED ORDER — INSULIN ASPART 100 UNIT/ML FLEXPEN: 10.0000 [IU] | PEN_INJECTOR | Freq: Two times a day (BID) | SUBCUTANEOUS | 3 refills | Status: DC

## 2024-03-11 MED ORDER — TRESIBA FLEXTOUCH 100 UNIT/ML ~~LOC~~ SOPN: 12.0000 [IU] | PEN_INJECTOR | SUBCUTANEOUS | 2 refills | Status: DC

## 2024-03-11 NOTE — Telephone Encounter (Signed)
 Patient contacts office in follow-up of phone call to reschedule.   Patient requests refill of both insulins. Doses verified, patient denies any low readings or symptoms. Provided prescription refills.   Patient continues to use CGM (continuous glucose monitor) with reader.   Patient scheduled with PCP and pharmacy clinic on 10/31  Total time with patient call and documentation of interaction: 9 minutes.

## 2024-03-11 NOTE — Telephone Encounter (Signed)
 Patient contacted for follow-up of diabetes care. Pt missed an appointment due to hospitalization, reached out today with the goal of rescheduling appointment.   Spoke to pt's wife who said the pt would call us  back later today.   Total time with patient call and documentation of interaction: 6 minutes.

## 2024-03-12 NOTE — Telephone Encounter (Signed)
 Reviewed and agree with Dr Rennis plan.

## 2024-03-25 NOTE — Patient Instructions (Incomplete)
 It was great to see you!  Our plans for today:  - Increase your tresiba  to 20u as Dr. Koval instructed. No change to your short acting insulin .  - No change to your other medications.  - Come back in 1 month on 12/1 at 10:30am.   We are checking some labs today, we will release these results to your MyChart.  Take care and seek immediate care sooner if you develop any concerns.   Dr. Desmond Tufano

## 2024-03-26 ENCOUNTER — Ambulatory Visit: Admitting: Pharmacist

## 2024-03-26 ENCOUNTER — Encounter: Payer: Self-pay | Admitting: Family Medicine

## 2024-03-26 ENCOUNTER — Encounter: Payer: Self-pay | Admitting: Pharmacist

## 2024-03-26 ENCOUNTER — Ambulatory Visit (INDEPENDENT_AMBULATORY_CARE_PROVIDER_SITE_OTHER): Admitting: Family Medicine

## 2024-03-26 VITALS — BP 133/76 | HR 70 | Wt 189.0 lb

## 2024-03-26 VITALS — BP 133/76 | HR 70 | Ht 72.0 in | Wt 189.0 lb

## 2024-03-26 DIAGNOSIS — N184 Chronic kidney disease, stage 4 (severe): Secondary | ICD-10-CM | POA: Diagnosis not present

## 2024-03-26 DIAGNOSIS — E119 Type 2 diabetes mellitus without complications: Secondary | ICD-10-CM

## 2024-03-26 DIAGNOSIS — E7800 Pure hypercholesterolemia, unspecified: Secondary | ICD-10-CM | POA: Diagnosis not present

## 2024-03-26 DIAGNOSIS — Z794 Long term (current) use of insulin: Secondary | ICD-10-CM

## 2024-03-26 DIAGNOSIS — I5022 Chronic systolic (congestive) heart failure: Secondary | ICD-10-CM | POA: Diagnosis not present

## 2024-03-26 DIAGNOSIS — I10 Essential (primary) hypertension: Secondary | ICD-10-CM

## 2024-03-26 DIAGNOSIS — D649 Anemia, unspecified: Secondary | ICD-10-CM | POA: Diagnosis not present

## 2024-03-26 NOTE — Assessment & Plan Note (Signed)
 Diabetes longstanding since 2008 currently uncontrolled with last A1C 13.3.Patient is able to verbalize appropriate hypoglycemia management plan. Medication adherence appears okay, patient sometimes gets confused with insulin  doses and swaps his Tresiba  and Novolog  (insulin  aspart) doses. Control is suboptimal due to lack of adherence and suboptimal regimen. Did not bring reader today and reports that he contacted company to receive a new one which will be here soon. Home glucose reader showed glucose of >400 in the past few readings. -Increase dose of Tresiba  (insulin  degludec) from 12 units to 20 units daily in the morning.  -Continued rapid insulin   Novolog  (insulin  aspart) at 12 units BID and counseled on taking prior to meals. -Patient educated on purpose, proper use, and potential adverse effects.  -Extensively discussed pathophysiology of diabetes, recommended lifestyle interventions, dietary effects on blood sugar control.  -Counseled on s/sx of and management of hypoglycemia.

## 2024-03-26 NOTE — Progress Notes (Signed)
 S:     Chief Complaint  Patient presents with   Medication Management    Diabetes management   73 y.o. male who presents for diabetes evaluation, education, and management. Patient arrives in  good spirits and presents with a walker for support.   Patient was referred and last seen by Primary Care Provider, Dr. Madelon, on 02/16/24.  At last visit, patient was decreased in dose of Tresiba  while he was sick to 12 units daily  PMH is significant for T2DM, hypertension, hyperlipidemia, GERD, CHF Patient reports Diabetes was diagnosed in 2008.   Current diabetes medications include: Tresiba  (insulin  degludec) 12 units daily, Novolog  (insulin  aspart) 12 units BID Current hypertension medications include: Carvedilol  25mg  BID, losartan /hydrochlorothiazide 50-12.5 mg  Current hyperlipidemia medications include: Rosuvastatin  20 mg daily  Patient reports adherence to taking all medications as prescribed.   Patient denies hypoglycemic events.  Patient denies nocturia (nighttime urination).  Patient denies neuropathy (nerve pain). Patient denies visual changes. Patient reports self foot exams.   Patient reported dietary habits: Eats 2 meals/day Breakfast: cereal - regulars, toast Lunch: peanut butter crackers Dinner: beans, baked chicken, fried fish  Snacks: Eats around 9 pm - peanut butter crackers, popcorn, potato chips Drinks: diet pepsi, water, gatorade zero  Patient-reported exercise habits: Walk a little bit.   O:  Review of Systems  All other systems reviewed and are negative.  Physical Exam Vitals reviewed.  Constitutional:      Appearance: Normal appearance.  Neurological:     Mental Status: He is alert.  Psychiatric:        Mood and Affect: Mood normal.        Behavior: Behavior normal.        Thought Content: Thought content normal.        Judgment: Judgment normal.    Lab Results  Component Value Date   HGBA1C 13.3 (H) 02/07/2024   Vitals:   03/26/24  1038  BP: 133/76  Pulse: 70  SpO2: 99%    Lipid Panel     Component Value Date/Time   CHOL 146 08/22/2022 1025   TRIG 91 08/22/2022 1025   HDL 59 08/22/2022 1025   CHOLHDL 2.5 08/22/2022 1025   CHOLHDL 3.2 02/28/2016 0906   VLDL 19 02/28/2016 0906   LDLCALC 70 08/22/2022 1025   LDLDIRECT 58 11/06/2011 1434   A/P: Diabetes longstanding since 2008 currently uncontrolled with last A1C 13.3.Patient is able to verbalize appropriate hypoglycemia management plan. Medication adherence appears okay, patient sometimes gets confused with insulin  doses and swaps his Tresiba  and Novolog  (insulin  aspart) doses. Control is suboptimal due to lack of adherence and suboptimal regimen. Did not bring reader today and reports that he contacted company to receive a new one which will be here soon. Home glucose reader showed glucose of >400 in the past few readings. -Increase dose of Tresiba  (insulin  degludec) from 12 units to 20 units daily in the morning.  -Continued rapid insulin   Novolog  (insulin  aspart) at 12 units BID and counseled on taking prior to meals. -Patient educated on purpose, proper use, and potential adverse effects.  -Extensively discussed pathophysiology of diabetes, recommended lifestyle interventions, dietary effects on blood sugar control.  -Counseled on s/sx of and management of hypoglycemia.   ASCVD risk - primary prevention in patient with diabetes. Last LDL is 70 at goal of <70  mg/dL. ASCVD risk factors include diabetes -Continued Rosuvastatin  20 mg.   Hypertension longstanding currently controlled on current regimen of carvedilol  and  losartan /hydrochlorothiazide. Blood pressure goal of <130/80  mmHg. Medication adherence good. -Continued Carvedilol  25 mg BID and losartan /hydrochlorothiazide 50-12.5 mg.  Written patient instructions provided. Patient verbalized understanding of treatment plan.  Total time in face to face counseling 33 minutes.    Follow-up:  Pharmacist  04/13/2024 PCP clinic visit today.  Patient seen with Lawson Mao, PharmD Candidate - PY3 student and Belvie Macintosh, PharmD - PY4 Candidate.

## 2024-03-26 NOTE — Assessment & Plan Note (Signed)
 ASCVD risk - primary prevention in patient with diabetes. Last LDL is 70 at goal of <70  mg/dL. ASCVD risk factors include diabetes -Continued Rosuvastatin  20 mg.

## 2024-03-26 NOTE — Assessment & Plan Note (Signed)
 Hypertension longstanding currently controlled on current regimen of carvedilol  and losartan /hydrochlorothiazide. Blood pressure goal of <130/80  mmHg. Medication adherence good. -Continued Carvedilol  25 mg BID and losartan /hydrochlorothiazide 50-12.5 mg.

## 2024-03-26 NOTE — Assessment & Plan Note (Signed)
 Previous intolerance to Jardiance  noted. - Continue current management and follow up with nephrology as scheduled.

## 2024-03-26 NOTE — Progress Notes (Signed)
   SUBJECTIVE:   CHIEF COMPLAINT / HPI:   Discussed the use of AI scribe software for clinical note transcription with the patient, who gave verbal consent to proceed.  History of Present Illness Anthony [name] is a 73 year old male with diabetes who presents for follow-up on insulin  management.  Glycemic control and insulin  management - Diabetes managed with Tresiba  (long-acting insulin ), recently increased to 20 units daily today with pharmacy. Previously on 12-15u daily - Uses fast-acting insulin  twice daily postprandially - Blood glucose readings average 232 mg/dL over the past two weeks, with a recent spike into the 300s - Awaiting new continuous glucose monitor sensors; not performing fingerstick checks regularly - Hospitalized in September, during which blood glucose levels were lower - No missed insulin  doses; administers long-acting insulin  each morning - last A1c 13.3% 01/2024  Hypertension and antihypertensive therapy - Hypertension managed with losartan /hydrochlorothiazide and furosemide  - Previously switched from coreg  to metoprolol  due to lower BP. He brought his medications today and has coreg , not metoprolol . He states he is taking both. - No dizziness, lightheadedness, or falls  Renal dysfunction and medication intolerance - Chronic kidney issues under nephrology care - Unable to tolerate Jardiance  due to renal effects   OBJECTIVE:   BP 133/76   Pulse 70   Ht 6' (1.829 m)   Wt 189 lb (85.7 kg)   SpO2 99%   BMI 25.63 kg/m   Gen: well appearing, in NAD Card: Reg rate Lungs: Comfortable WOB on RA Ext: WWP   ASSESSMENT/PLAN:   HTN (hypertension) At goal without orthostatic symptoms, no changes. Removed metoprolol  from med list.  Insulin  dependent type 2 diabetes mellitus (HCC) Chronic, uncontrolled. Increase tresiba  to 20u per pharmacy, agree. Counseled on use of CGM and regular CBG checks. Continue novolog  as is. F/u 1 month.  Chronic kidney disease  (CKD), stage IV (severe) (HCC) Previous intolerance to Jardiance  noted. - Continue current management and follow up with nephrology as scheduled.  Anemia No recent gastrointestinal bleeding. GI follow-up scheduled. - Order blood counts to assess current status of anemia. - Follow up with GI appointment on November 18th.    Donald CHRISTELLA Lai, DO

## 2024-03-26 NOTE — Patient Instructions (Addendum)
 It was nice to see you today!  Your goal blood sugar is 80-130 before eating and less than 180 after eating.  Medication Changes: Increase your long acting Tresiba  (insulin  degludec) to 20 units daily. Continue your short acting Novolog  (insulin  aspart) at 12 units BEFORE meals.  Continue all other medication the same.   Monitor blood sugars at home and keep a log (glucometer or piece of paper) to bring with you to your next visit.  Keep up the good work with diet and exercise. Aim for a diet full of vegetables, fruit and lean meats (chicken, turkey, fish). Try to limit salt intake by eating fresh or frozen vegetables (instead of canned), rinse canned vegetables prior to cooking and do not add any additional salt to meals.

## 2024-03-26 NOTE — Assessment & Plan Note (Signed)
 At goal without orthostatic symptoms, no changes. Removed metoprolol  from med list.

## 2024-03-26 NOTE — Assessment & Plan Note (Addendum)
 Chronic, uncontrolled. Increase tresiba  to 20u per pharmacy, agree. Counseled on use of CGM and regular CBG checks. Continue novolog  as is. F/u 1 month.

## 2024-03-26 NOTE — Assessment & Plan Note (Signed)
 No recent gastrointestinal bleeding. GI follow-up scheduled. - Order blood counts to assess current status of anemia. - Follow up with GI appointment on November 18th.

## 2024-03-27 LAB — BASIC METABOLIC PANEL WITH GFR
BUN/Creatinine Ratio: 16 (ref 10–24)
BUN: 34 mg/dL — ABNORMAL HIGH (ref 8–27)
CO2: 25 mmol/L (ref 20–29)
Calcium: 9.3 mg/dL (ref 8.6–10.2)
Chloride: 101 mmol/L (ref 96–106)
Creatinine, Ser: 2.13 mg/dL — ABNORMAL HIGH (ref 0.76–1.27)
Glucose: 116 mg/dL — ABNORMAL HIGH (ref 70–99)
Potassium: 4.6 mmol/L (ref 3.5–5.2)
Sodium: 140 mmol/L (ref 134–144)
eGFR: 32 mL/min/1.73 — ABNORMAL LOW (ref >59)

## 2024-03-27 LAB — CBC
Hematocrit: 35.1 % — ABNORMAL LOW (ref 37.5–51.0)
Hemoglobin: 11.1 g/dL — ABNORMAL LOW (ref 13.0–17.7)
MCH: 30.4 pg (ref 26.6–33.0)
MCHC: 31.6 g/dL (ref 31.5–35.7)
MCV: 96 fL (ref 79–97)
Platelets: 148 x10E3/uL — ABNORMAL LOW (ref 150–450)
RBC: 3.65 x10E6/uL — ABNORMAL LOW (ref 4.14–5.80)
RDW: 14.2 % (ref 11.6–15.4)
WBC: 7.8 x10E3/uL (ref 3.4–10.8)

## 2024-03-29 ENCOUNTER — Ambulatory Visit: Payer: Self-pay | Admitting: Family Medicine

## 2024-03-30 ENCOUNTER — Telehealth (HOSPITAL_BASED_OUTPATIENT_CLINIC_OR_DEPARTMENT_OTHER): Payer: Self-pay

## 2024-03-30 NOTE — Telephone Encounter (Addendum)
   Pre-operative Risk Assessment    Patient Name: Anthony [name]  DOB: 1950-12-28 MRN: [id number]   Date of last office visit: 11/20/2023 - Dr. Fonda Kitty Date of next office visit: N/A   Request for Surgical Clearance    Procedure:  Lumbar Epidural Steroid Injection  Date of Surgery:  Clearance TBD                                 Surgeon:  Not specified  Surgeon's Group or Practice Name:  [facility] Phone number:  [phone] Fax number:  [phone] - Attention: Apolinar Ester   Type of Clearance Requested:   - Medical  - Pharmacy:  Hold Apixaban  (Eliquis ) -D/C 3 days prior to procedure and resume 24 hours after procedure    Type of Anesthesia:  None    Additional requests/questions:  PATIENT HAS A BiV ICD  Signed, Patrcia Iverson CROME   03/30/2024, 10:11 AM

## 2024-03-30 NOTE — Progress Notes (Signed)
 Reviewed and agree with Dr Rennis plan.

## 2024-03-30 NOTE — Telephone Encounter (Signed)
 No special management needed for a device for a steroid injection.

## 2024-04-08 NOTE — Telephone Encounter (Signed)
 Tired to call the pt and got his vm again

## 2024-04-08 NOTE — Telephone Encounter (Signed)
   Name: Anthony [name]  DOB: [birth year]  MRN: [id number]  Primary Cardiologist: Vina Gull, MD   Preoperative team, please contact this patient and set up a phone call appointment for further preoperative risk assessment. Please obtain consent and complete medication review. Thank you for your help.  I confirm that guidance regarding antiplatelet and oral anticoagulation therapy has been completed and, if necessary, noted below.  Patient has not had an Afib/aflutter ablation in the last 3 months, DCCV within the last 4 weeks or a watchman implanted in the last 45 days    Per office protocol, patient can hold Eliquis  for 3 days prior to procedure.   Patient will not need bridging with Lovenox  (enoxaparin ) around procedure.  I also confirmed the patient resides in the state of [place] . As per [facility] Medical Board telemedicine laws, the patient must reside in the state in which the provider is licensed.   Anthony CHRISTELLA Beauvais, NP 04/08/2024, 10:59 AM [facility]

## 2024-04-08 NOTE — Telephone Encounter (Signed)
 Patient with diagnosis of atrial fibrillation on Eliquis  for anticoagulation.    Procedure:  Lumbar Epidural Steroid Injection   Date of Surgery:  Clearance TBD      CHA2DS2-VASc Score = 7   This indicates a 11.2% annual risk of stroke. The patient's score is based upon: CHF History: 1 HTN History: 1 Diabetes History: 1 Stroke History: 2 Vascular Disease History: 1 Age Score: 1 Gender Score: 0   CrCl 38 Platelet count 148  Patient has not had an Afib/aflutter ablation in the last 3 months, DCCV within the last 4 weeks or a watchman implanted in the last 45 days   Per office protocol, patient can hold Eliquis  for 3 days prior to procedure.   Patient wwill not need bridging with Lovenox  (enoxaparin ) around procedure.  **This guidance is not considered finalized until pre-operative APP has relayed final recommendations.**

## 2024-04-08 NOTE — Telephone Encounter (Signed)
 Left message to call back to scheduled tele preop appt.

## 2024-04-08 NOTE — Telephone Encounter (Signed)
 Patient is returning call.

## 2024-04-12 ENCOUNTER — Telehealth (HOSPITAL_BASED_OUTPATIENT_CLINIC_OR_DEPARTMENT_OTHER): Payer: Self-pay | Admitting: *Deleted

## 2024-04-12 NOTE — Telephone Encounter (Signed)
 Again I tried pt's # [phone] and vm is full. I called DPR pt's wife [name], she handed the phone to the pt. I s/w the pt and scheduled tele preop appt 04/19/24. Med rec and consent are done. Tried to give the pt the cardiac rehab line [phone], but pt said he did not have anything to write with now. Pt has been made aware to be aware of tele preop appt 04/19/24 @ 11:20.

## 2024-04-12 NOTE — Telephone Encounter (Signed)
 Again I tried pt's # [phone] and vm is full. I called DPR pt's wife Anthony [name], she handed the phone to the pt. I s/w the pt and scheduled tele preop appt 04/19/24. Med rec and consent are done. Tried to give the pt the cardiac rehab line [phone], but pt said he did not have anything to write with now. Pt has been made aware to be aware of tele preop appt 04/19/24 @ 11:20.      Patient Consent for Virtual Visit        Anthony [name] has provided verbal consent on 04/12/2024 for a virtual visit (video or telephone).   CONSENT FOR VIRTUAL VISIT FOR:  Anthony [name]  By participating in this virtual visit I agree to the following:  I hereby voluntarily request, consent and authorize [facility] and its employed or contracted physicians, physician assistants, nurse practitioners or other licensed health care professionals (the Practitioner), to provide me with telemedicine health care services (the "Services) as deemed necessary by the treating Practitioner. I acknowledge and consent to receive the Services by the Practitioner via telemedicine. I understand that the telemedicine visit will involve communicating with the Practitioner through live audiovisual communication technology and the disclosure of certain medical information by electronic transmission. I acknowledge that I have been given the opportunity to request an in-person assessment or other available alternative prior to the telemedicine visit and am voluntarily participating in the telemedicine visit.  I understand that I have the right to withhold or withdraw my consent to the use of telemedicine in the course of my care at any time, without affecting my right to future care or treatment, and that the Practitioner or I may terminate the telemedicine visit at any time. I understand that I have the right to inspect all information obtained and/or recorded in the course of the telemedicine visit and may receive copies of  available information for a reasonable fee.  I understand that some of the potential risks of receiving the Services via telemedicine include:  Delay or interruption in medical evaluation due to technological equipment failure or disruption; Information transmitted may not be sufficient (e.g. poor resolution of images) to allow for appropriate medical decision making by the Practitioner; and/or  In rare instances, security protocols could fail, causing a breach of personal health information.  Furthermore, I acknowledge that it is my responsibility to provide information about my medical history, conditions and care that is complete and accurate to the best of my ability. I acknowledge that Practitioner's advice, recommendations, and/or decision may be based on factors not within their control, such as incomplete or inaccurate data provided by me or distortions of diagnostic images or specimens that may result from electronic transmissions. I understand that the practice of medicine is not an exact science and that Practitioner makes no warranties or guarantees regarding treatment outcomes. I acknowledge that a copy of this consent can be made available to me via my patient portal [facility] MyChart), or I can request a printed copy by calling the office of [facility].    I understand that my insurance will be billed for this visit.   I have read or had this consent read to me. I understand the contents of this consent, which adequately explains the benefits and risks of the Services being provided via telemedicine.  I have been provided ample opportunity to ask questions regarding this consent and the Services and have had my questions answered to my satisfaction.  I give my informed consent for the services to be provided through the use of telemedicine in my medical care

## 2024-04-13 ENCOUNTER — Encounter: Payer: Self-pay | Admitting: Pharmacist

## 2024-04-13 ENCOUNTER — Ambulatory Visit: Admitting: Gastroenterology

## 2024-04-13 ENCOUNTER — Ambulatory Visit: Admitting: Pharmacist

## 2024-04-13 VITALS — BP 117/71 | HR 73 | Wt 219.2 lb

## 2024-04-13 DIAGNOSIS — E119 Type 2 diabetes mellitus without complications: Secondary | ICD-10-CM

## 2024-04-13 DIAGNOSIS — I10 Essential (primary) hypertension: Secondary | ICD-10-CM

## 2024-04-13 DIAGNOSIS — E7800 Pure hypercholesterolemia, unspecified: Secondary | ICD-10-CM | POA: Diagnosis not present

## 2024-04-13 DIAGNOSIS — I4821 Permanent atrial fibrillation: Secondary | ICD-10-CM | POA: Diagnosis not present

## 2024-04-13 DIAGNOSIS — Z794 Long term (current) use of insulin: Secondary | ICD-10-CM

## 2024-04-13 DIAGNOSIS — Z87891 Personal history of nicotine dependence: Secondary | ICD-10-CM | POA: Diagnosis not present

## 2024-04-13 MED ORDER — APIXABAN 5 MG PO TABS: 5.0000 mg | ORAL_TABLET | Freq: Two times a day (BID) | ORAL | Status: AC

## 2024-04-13 MED ORDER — TRESIBA FLEXTOUCH 100 UNIT/ML ~~LOC~~ SOPN: 28.0000 [IU] | PEN_INJECTOR | SUBCUTANEOUS | Status: DC

## 2024-04-13 MED ORDER — INSULIN ASPART 100 UNIT/ML FLEXPEN: 6.0000 [IU] | PEN_INJECTOR | Freq: Two times a day (BID) | SUBCUTANEOUS | Status: DC

## 2024-04-13 NOTE — Assessment & Plan Note (Signed)
 ASCVD risk - primary prevention in patient with diabetes. Last LDL is 70 at goal of <70  mg/dL. ASCVD risk factors include diabetes -Continued Rosuvastatin  20 mg.

## 2024-04-13 NOTE — Assessment & Plan Note (Signed)
 Hypertension longstanding currently controlled on current regimen of carvedilol  and losartan /hydrochlorothiazide. Blood pressure goal of <130/80  mmHg. Medication adherence good. -Continued Carvedilol  25 mg BID and losartan /hydrochlorothiazide 50-12.5 mg.

## 2024-04-13 NOTE — Assessment & Plan Note (Addendum)
 Diabetes longstanding since 2008 currently uncontrolled with last A1C 13.3.  Patient is able to verbalize appropriate hypoglycemia management plan. Medication adherence appears okay, patient sometimes gets confused with insulin  doses. Control is suboptimal due to lack of adherence and suboptimal regimen.  -Increase dose of Tresiba  (insulin  degludec) to 28 units daily  -Adjusted short-acting Novolog  (insulin  aspart) to 6 units at lunch and 10 units at dinner.  -Patient educated on purpose, proper use, and potential adverse effects.  -Extensively discussed pathophysiology of diabetes, recommended lifestyle interventions, dietary effects on blood sugar control.

## 2024-04-13 NOTE — Progress Notes (Signed)
 S:     Chief Complaint  Patient presents with   Medication Management    Diabetes management   74 y.o. male who presents for diabetes evaluation, education, and management. Patient arrives in  good spirits and presents with assistance of rolling walker  Patient was referred and last seen by Primary Care Provider, Dr. Madelon, on 02/16/24.  At last pharmacy visit, Tresiba  and Novolog  (insulin  aspart) were adjusted and patient was requested to bring in CGM reader at the next visit   PMH is significant for T2DM, hypertension, hyperlipidemia, GERD, CHF Patient reports Diabetes was diagnosed in 2008.    Current diabetes medications include: Tresiba  (insulin  degludec) 20 units daily, Novolog  (insulin  aspart) 12 units BID Current hypertension medications include: Carvedilol  25mg  BID Current hyperlipidemia medications include: Rosuvastatin  20 mg daily   Patient reports adherence to taking all medications as prescribed.    Patient denies hypoglycemic events.  Patient reports nocturia (nighttime urination).  Patient denies neuropathy (nerve pain). Patient denies visual changes. Patient reports self foot exams.   Patient reported dietary habits: Eats 2 meals/day  Patient-reported exercise habits: Walks a little bit - to stop sign and back twice a day  O:  Review of Systems  All other systems reviewed and are negative.  Physical Exam Vitals reviewed.  Constitutional:      Appearance: Normal appearance.  Neurological:     Mental Status: He is alert.  Psychiatric:        Mood and Affect: Mood normal.        Behavior: Behavior normal.        Thought Content: Thought content normal.    Average Glucose: 225 mg/dL Time in Goal:  - Time in range 70-180: 25% - Time below range: 3%  Lab Results  Component Value Date   HGBA1C 13.3 (H) 02/07/2024   Vitals:   04/13/24 1053  BP: 117/71  Pulse: 73  SpO2: 100%    Lipid Panel     Component Value Date/Time   CHOL 146  08/22/2022 1025   TRIG 91 08/22/2022 1025   HDL 59 08/22/2022 1025   CHOLHDL 2.5 08/22/2022 1025   CHOLHDL 3.2 02/28/2016 0906   VLDL 19 02/28/2016 0906   LDLCALC 70 08/22/2022 1025   LDLDIRECT 58 11/06/2011 1434    A/P: Diabetes longstanding since 2008 currently uncontrolled with last A1C 13.3.  Patient is able to verbalize appropriate hypoglycemia management plan. Medication adherence appears okay, patient sometimes gets confused with insulin  doses. Control is suboptimal due to lack of adherence and suboptimal regimen.  -Increase dose of Tresiba  (insulin  degludec) to 28 units daily  -Adjusted short-acting Novolog  (insulin  aspart) to 6 units at lunch and 10 units at dinner.  -Patient educated on purpose, proper use, and potential adverse effects.  -Extensively discussed pathophysiology of diabetes, recommended lifestyle interventions, dietary effects on blood sugar control.  -Counseled on s/sx of and management of hypoglycemia.   ASCVD risk - primary prevention in patient with diabetes. Last LDL is 70 at goal of <70  mg/dL. ASCVD risk factors include diabetes -Continued Rosuvastatin  20 mg.    Hypertension longstanding currently controlled on current regimen of carvedilol  and losartan /hydrochlorothiazide. Blood pressure goal of <130/80  mmHg. Medication adherence good. -Continued Carvedilol  25 mg BID and losartan /hydrochlorothiazide 50-12.5 mg.  Medication Samples have been provided to the patient. Drug name: Eliquis     Strength: 5mg         Qty: 6 boxes LOT: [id number]  Exp.Date: 07/2024  The patient has  been instructed regarding the correct time, dose, and frequency of taking this medication, including desired effects and most common side effects.   Maude Lagos 10:52 AM 04/13/2024   Patient reports that he has restarted smoking after being hospitalized and reports smoking around 4 cigarettes a day. Previously a smoker of 12.9 pack years and had quit in 2020 for 5 years. Patient  reports that he smokes when he is bored and/or anxious. Counseled to try to make progress and can make a plan at next visit if he is struggling. Rates confidence of quitting by end of year a 2/10. Rates confidence of going down to 2 cigarettes by end of year pretty likely.   Written patient instructions provided. Patient verbalized understanding of treatment plan.  Total time in face to face counseling 33 minutes.    Follow-up:  PCP clinic visit in 04/26/2024 Patient seen with Lawson Mao, PharmD Candidate - PY3 student and Recardo [employer] PharmD - PY4 Candidate.

## 2024-04-13 NOTE — Assessment & Plan Note (Signed)
 Patient reports that he has restarted smoking after being hospitalized and reports smoking around 4 cigarettes a day. Previously a smoker of 12.9 pack years and had quit in 2020 for 5 years. Patient reports that he smokes when he is bored and/or anxious. Counseled to try to make progress and can make a plan at next visit if he is struggling. Rates confidence of quitting by end of year a 2/10. Rates confidence of going down to 2 cigarettes by end of year pretty likely.

## 2024-04-13 NOTE — Patient Instructions (Addendum)
 Medication Changes: Take Tresiba  28 units in the morning Take Novolog  (insulin  aspart) 6 units at lunch time Take Novolog  (insulin  aspart) 10 units at dinner  Continue all other medication the same.

## 2024-04-15 NOTE — Progress Notes (Signed)
 Reviewed and agree with Dr Rennis plan.

## 2024-04-16 ENCOUNTER — Encounter: Payer: Self-pay | Admitting: Dietician

## 2024-04-16 ENCOUNTER — Encounter: Attending: Family Medicine | Admitting: Dietician

## 2024-04-16 DIAGNOSIS — E119 Type 2 diabetes mellitus without complications: Secondary | ICD-10-CM | POA: Insufficient documentation

## 2024-04-16 DIAGNOSIS — Z794 Long term (current) use of insulin: Secondary | ICD-10-CM | POA: Diagnosis present

## 2024-04-16 NOTE — Progress Notes (Signed)
 Diabetes Self-Management Education  Visit Type: First/Initial  Appt. Start Time: 1330 Appt. End Time: 1630  04/16/2024  [name], identified by name and date of birth, is a 73 y.o. male with a diagnosis of Diabetes: Type 2.   ASSESSMENT Patient is here today alone.    Pt walks with a cane; needed assistance with wheelchair for visit.  Pt reports 2 falls in the last 6 months, resulting in injury.  Reports hip, leg, and nerve pain due to fall, broken rib and scraped head/face. He states that they use no added salt but they do use a lot of processed foods. He reports always taking his medication and rotating his insulin  injection sites. Her verbalizes proper treatment for low blood sugar but this was also reviewed. Patient may benefit from [facility].  History:  ED visit on 02/06/24 due to Syncope, fall, and hypoglycemia. CHF, T2DM (~1986), OSA (uses CPAP), GERD, HTN, HLD, CKD, CVA, MI Medications/Supplements include:  Tresiba  28 units per a.m. Novolog  10 units before breakfast and dinner. 6 units before lunch. Oxycodone  2x/day, Lasix , Omeprazole , MVI, Vitamin D Labs noted to include:  A1C 13.3% on 02/07/24, increased from 9.2% on 01/28/23. BUN 34 on 03/26/24. eGFR 32 03/26/24. Creatinine 2.13 on 03/26/24. Potassium 4.6 on 03/26/24.   CGM Libre 3+. Current sensor reading is 185. Fasting blood glucose (6:30 AM) was 160. Pt reports that is normal for his fasting blood sugar.   Reports Ht is 6'1 Wt Readings from Last 3 Encounters:  04/13/24 219 lb 3.2 oz (99.4 kg)  03/26/24 189 lb (85.7 kg)  03/26/24 189 lb (85.7 kg)  Pt reports he lost weight while he was in the hospital.   Patient lives with his wife. Pt reports wife does the shopping and cooking for home.  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  Pt reports 3 hypoglycemic episodes in the last month and states that he tends to over treat a low.    Diabetes Self-Management Education - 04/16/24 1550        Visit Information   Visit Type First/Initial      Initial Visit   Diabetes Type Type 2    Date Diagnosed 1986    Are you currently following a meal plan? Yes    What type of meal plan do you follow? No added sugar. Low sodium & low fat diet.    Are you taking your medications as prescribed? Yes      Health Coping   How would you rate your overall health? Poor      Psychosocial Assessment   Patient Belief/Attitude about Diabetes Other (comment)   Dealing with it   What is the hardest part about your diabetes right now, causing you the most concern, or is the most worrisome to you about your diabetes?   Being active    Self-care barriers Debilitated state due to current medical condition    Self-management support Doctor's office;Family    Other persons present Patient    Patient Concerns Nutrition/Meal planning;Glycemic Control;Problem Solving    Special Needs Simplified materials    Preferred Learning Style No preference indicated    Learning Readiness Ready    How often do you need to have someone help you when you read instructions, pamphlets, or other written materials from your doctor or pharmacy? 3 - Sometimes    What is the last grade level you completed in school? GED      Pre-Education Assessment   Patient  understands the diabetes disease and treatment process. Needs Instruction    Patient understands incorporating nutritional management into lifestyle. Needs Instruction    Patient undertands incorporating physical activity into lifestyle. Needs Instruction    Patient understands using medications safely. Needs Instruction    Patient understands monitoring blood glucose, interpreting and using results Needs Instruction    Patient understands prevention, detection, and treatment of acute complications. Needs Instruction    Patient understands prevention, detection, and treatment of chronic complications. Needs Instruction    Patient understands how to develop strategies to  address psychosocial issues. Needs Instruction    Patient understands how to develop strategies to promote health/change behavior. Needs Instruction      Complications   Last HgB A1C per patient/outside source 13.3 %    How often do you check your blood sugar? > 4 times/day    Fasting Blood glucose range (mg/dL) 869-820    Number of hypoglycemic episodes per month 3    Can you tell when your blood sugar is low? Yes    What do you do if your blood sugar is low? drinks sugary beverage    Have you had a dilated eye exam in the past 12 months? Yes    Have you had a dental exam in the past 12 months? No   No teeth or dentures   Are you checking your feet? No      Dietary Intake   Breakfast none (usually skipped)    Snack (morning) nutri-grain bar    Lunch peanut butter & crackers (8 total)    Dinner 4-5 PM: canned vegetable soup & ham sandwich    Snack (evening) peanut butter & crackers    Beverage(s) water, diet pepsi      Activity / Exercise   Activity / Exercise Type ADL's    How many days per week do you exercise? 0    How many minutes per day do you exercise? 0    Total minutes per week of exercise 0      Patient Education   Previous Diabetes Education Yes    Healthy Eating Plate Method;Meal options for control of blood glucose level and chronic complications.    Medications Reviewed patients medication for diabetes, action, purpose, timing of dose and side effects.    Monitoring Taught/evaluated CGM (comment);Identified appropriate SMBG and/or A1C goals.    Acute complications Taught prevention, symptoms, and  treatment of hypoglycemia - the 15 rule.;Discussed and identified patients' prevention, symptoms, and treatment of hyperglycemia.    Chronic complications Relationship between chronic complications and blood glucose control;Identified and discussed with patient  current chronic complications    Diabetes Stress and Support Identified and addressed patients feelings and  concerns about diabetes;Worked with patient to identify barriers to care and solutions;Role of stress on diabetes      Individualized Goals (developed by patient)   Nutrition General guidelines for healthy choices and portions discussed    Physical Activity Not Applicable    Medications take my medication as prescribed    Monitoring  Consistenly use CGM    Problem Solving Eating Pattern;Addressing barriers to behavior change    Reducing Risk examine blood glucose patterns;do foot checks daily;treat hypoglycemia with 15 grams of carbs if blood glucose less than 70mg /dL      Post-Education Assessment   Patient understands the diabetes disease and treatment process. Comprehends key points    Patient understands incorporating nutritional management into lifestyle. Needs Review    Patient undertands incorporating  physical activity into lifestyle. N/A    Patient understands using medications safely. Needs Review    Patient understands monitoring blood glucose, interpreting and using results Needs Review    Patient understands prevention, detection, and treatment of acute complications. Needs Review    Patient understands prevention, detection, and treatment of chronic complications. Needs Review    Patient understands how to develop strategies to address psychosocial issues. Needs Review    Patient understands how to develop strategies to promote health/change behavior. Needs Review      Outcomes   Expected Outcomes Demonstrated interest in learning but significant barriers to change    Future DMSE 4-6 wks    Program Status Not Completed          Individualized Plan for Diabetes Self-Management Training:   Learning Objective:  Patient will have a greater understanding of diabetes self-management. Patient education plan is to attend individual and/or group sessions per assessed needs and concerns.   Plan:   Patient Instructions  Continue no added salt.  Avoid/limit processed meats,  such as ham, lunch meat, hot dogs, and bacon.  Avoid red meat.   Eat more foods prepared at home than eating out.  Rinse canned vegetables and beans.     Expected Outcomes:  Demonstrated interest in learning but significant barriers to change  Education material provided: ADA - How to Thrive: A Guide for Your Journey with Diabetes and Snack sheet. Heart Healthy consistent carbohydrates from AND  If problems or questions, patient to contact team via:  Phone  Future DSME appointment: 4-6 wks

## 2024-04-16 NOTE — Patient Instructions (Addendum)
 Continue no added salt.  Avoid/limit processed meats, such as ham, lunch meat, hot dogs, and bacon.  Avoid red meat.   Eat more foods prepared at home than eating out.  Rinse canned vegetables and beans.

## 2024-04-19 ENCOUNTER — Ambulatory Visit: Attending: Cardiology | Admitting: Cardiology

## 2024-04-19 DIAGNOSIS — D6859 Other primary thrombophilia: Secondary | ICD-10-CM | POA: Diagnosis not present

## 2024-04-19 NOTE — Progress Notes (Signed)
 Virtual Visit via Telephone Note   Because of [name] co-morbid illnesses, he is at least at moderate risk for complications without adequate follow up.  This format is felt to be most appropriate for this patient at this time.  Due to technical limitations with video connection (technology), today's appointment will be conducted as an audio only telehealth visit, and Anthony [name] verbally agreed to proceed in this manner.   All issues noted in this document were discussed and addressed.  No physical exam could be performed with this format.  Evaluation Performed:  Preoperative cardiovascular risk assessment _____________   Date:  04/19/2024   Patient ID:  Anthony [name], DOB 1950/07/14, MRN [id number] Patient Location:  Home Provider location:   Office  Primary Care Provider:  Madelon Donald HERO, DO Primary Cardiologist:  Anthony Gull, MD  Chief Complaint / Patient Profile   73 y.o. y/o male with a h/o atrial fibrillation s/p CRT-D ad AVN ablation, HFrEF who is pending lumbar epidural steroid injection and presents today for telephonic preoperative cardiovascular risk assessment.  History of Present Illness    Anthony [name] is a 73 y.o. male who presents via audio/video conferencing for a telehealth visit today.  Pt was last seen in cardiology clinic on 11/20/2023 by Anthony [name].  At that time Anthony [name] was doing well.  The patient is now pending procedure as outlined above. Since his last visit, he has been doing well, no formal complaints from a cardiac perspective.  He walks daily for exercise and does so without any anginal complaints. He denies chest pain, palpitations, dyspnea, pnd, orthopnea, n, v, dizziness, syncope, edema, weight gain, or early satiety.     Past Medical History    Past Medical History:  Diagnosis Date   Arthritis    Benign neoplasm of descending colon    Benign neoplasm of sigmoid colon    Benign neoplasm of transverse colon    CAD in  native artery    a. reported MI 2000, 2001, s/p stenting of the circumflex lesion extending into the second obtuse marginal branch with a drug-eluting stent in 2003. b. low risk nuc 2015.   Chest pain at rest 01/27/2024   Chronic atrial fibrillation (HCC)    Chronic combined systolic and diastolic CHF (congestive heart failure) (HCC)    a. Previously diastolic, then EF 25-30% in 10/2016.   CKD (chronic kidney disease), stage III (HCC)    Depression    Diabetes mellitus    Edema of extremities    Erectile dysfunction    GERD (gastroesophageal reflux disease)    Hyperlipidemia    Hypertension    Myocardial infarction (HCC) 2000&2001   Obesity    Onychomycosis    OSA (obstructive sleep apnea)    S/P ICD (internal cardiac defibrillator) procedure and BiV device,  07/25/17 Medtronic  07/26/2017   Seizures (HCC)    Sleep apnea    Stroke (HCC)    Tubular adenoma of colon 10/2002   Type 2 diabetes mellitus with hyperosmolar hyperglycemic state (HHS) (HCC) 01/26/2024   Past Surgical History:  Procedure Laterality Date   ANGIOPLASTY  2001   stent x 1   AV NODE ABLATION N/A 08/20/2017   Procedure: AV NODE ABLATION;  Surgeon: Anthony Elspeth BROCKS, MD;  Location: [facility] INVASIVE CV LAB;  Service: Cardiovascular;  Laterality: N/A;   BIV ICD GENERATOR CHANGEOUT N/A 05/17/2022   Procedure: BIV ICD GENERATOR CHANGEOUT;  Surgeon: Anthony Elspeth BROCKS, MD;  Location: [facility] INVASIVE CV LAB;  Service: Cardiovascular;  Laterality: N/A;   BIV ICD INSERTION CRT-D N/A 07/25/2017   Procedure: BIV ICD INSERTION CRT-D;  Surgeon: Anthony Elspeth BROCKS, MD;  Location: [facility] INVASIVE CV LAB;  Service: Cardiovascular;  Laterality: N/A;   COLONOSCOPY  2000?   negative   COLONOSCOPY WITH PROPOFOL  N/A 02/08/2019   Procedure: COLONOSCOPY WITH PROPOFOL ;  Surgeon: Anthony Gwendlyn DASEN, MD;  Location: [facility] ENDOSCOPY;  Service: Endoscopy;  Laterality: N/A;   FOOT ARTHROTOMY Right    POLYPECTOMY  02/08/2019   Procedure: POLYPECTOMY;  Surgeon: Anthony Gwendlyn DASEN, MD;  Location: [facility] ENDOSCOPY;  Service: Endoscopy;;   TOE AMPUTATION Left 2012    Allergies  Allergies  Allergen Reactions   Enalapril Maleate Cough    Home Medications    Prior to Admission medications   Medication Sig Start Date End Date Taking? Authorizing Provider  acetaminophen  (TYLENOL ) 650 MG CR tablet Take 1,300 mg by mouth 2 (two) times daily as needed for pain.    [provider]  apixaban  (ELIQUIS ) 5 MG TABS tablet Take 1 tablet (5 mg total) by mouth 2 (two) times daily. 04/13/24   [name], Anthony BIRCH, MD  baclofen (LIORESAL) 10 MG tablet Take 10 mg by mouth every 6 (six) hours as needed for muscle spasms. Patient not taking: Reported on 04/13/2024    [provider]  BD PEN NEEDLE NANO 2ND GEN 32G X 4 MM MISC USE AS DIRECTED 03/04/24   Anthony Donald HERO, DO  Blood Glucose Monitoring Suppl (BLOOD GLUCOSE MONITOR SYSTEM) w/Device KIT Use 3 (three) times daily. 02/09/24   Anthony Lung, MD  calcitRIOL  (ROCALTROL ) 0.5 MCG capsule Take 0.5 mcg by mouth daily. Patient not taking: Reported on 04/16/2024 09/02/22   [provider]  carvedilol  (COREG ) 25 MG tablet Take 25 mg by mouth 2 (two) times daily with a meal.    [provider]  Continuous Glucose Sensor (FREESTYLE LIBRE 3 PLUS SENSOR) MISC Change sensor every 15 days. 02/16/24   [name], Anthony M, DO  dorzolamide -timolol  (COSOPT ) 2-0.5 % ophthalmic solution Place 1 drop into both eyes 2 (two) times daily. 12/16/23   [provider]  feeding supplement (ENSURE PLUS HIGH PROTEIN) LIQD Take 237 mLs by mouth 2 (two) times daily between meals. Patient not taking: Reported on 04/16/2024 01/30/24   Anthony Lung, MD  furosemide  (LASIX ) 20 MG tablet Take 3 tablets (60 mg total) by mouth daily. 11/06/23   [name], Anthony M, DO  gabapentin  (NEURONTIN ) 100 MG capsule Take 2 capsules (200 mg total) by mouth every 8 (eight) hours. 01/30/24   Anthony Lung, MD  Glucose Blood (BLOOD GLUCOSE TEST  STRIPS) STRP Use 3 (three) times daily. Use as directed to check blood sugar. 02/09/24   Anthony Lung, MD  insulin  aspart (NOVOLOG ) 100 UNIT/ML FlexPen Inject 6-10 Units into the skin 2 (two) times daily before a meal. Inject 6 units at lunch time and 10 units at dinner. 04/13/24   [name], Anthony BIRCH, MD  insulin  degludec (TRESIBA  FLEXTOUCH) 100 UNIT/ML FlexTouch Pen Inject 28 Units into the skin daily. 04/13/24   [name], Anthony BIRCH, MD  Lancet Device MISC Use 3 (three) times daily. May dispense any manufacturer covered by patient's insurance. 02/09/24   Anthony Lung, MD  Lancets [facility] DELICA PLUS [place]) MISC Use 1 each 3 (three) times daily. Use as directed to check blood sugar. 02/09/24   Anthony Lung, MD  latanoprost  (XALATAN ) 0.005 % ophthalmic solution Place 1 drop into  both eyes at bedtime. 09/03/22   [provider]  lidocaine  (LIDODERM ) 5 % Place 1 patch onto the skin daily. Remove & Discard patch within 12 hours or as directed by MD Patient not taking: Reported on 04/13/2024 01/30/24   Anthony Lung, MD  linaclotide [facility]) 145 MCG CAPS capsule Take 145 mcg by mouth daily before breakfast.    [provider]  loratadine  (CLARITIN ) 10 MG tablet Take 10 mg by mouth daily.    [provider]  losartan -hydrochlorothiazide (HYZAAR) 50-12.5 MG tablet TAKE 1 TABLET BY MOUTH EVERY DAY 09/15/23   [name], Anthony M, DO  Multiple Vitamin (MULTIVITAMIN) tablet Take 1 tablet by mouth daily.      [provider]  nitroGLYCERIN  (NITROSTAT ) 0.4 MG SL tablet Place 1 tablet (0.4 mg total) under the tongue every 5 (five) minutes as needed for chest pain. If no relief by 3rd tab, call 911 Patient not taking: Reported on 04/16/2024 10/04/22   [name], Anthony BIRCH, MD  omeprazole  (PRILOSEC) 20 MG capsule Take 20 mg by mouth daily.    [provider]  Oxycodone  HCl 10 MG TABS Take 10 mg by mouth in the morning, at noon, and at bedtime.    [provider]  PARoxetine   (PAXIL ) 30 MG tablet TAKE 1 TABLET BY MOUTH EVERY DAY 06/25/23   [name], Anthony M, DO  polyethylene glycol (MIRALAX  / GLYCOLAX ) 17 g packet Take 17 g by mouth once. Patient not taking: Reported on 04/13/2024    [provider]  rosuvastatin  (CRESTOR ) 20 MG tablet Take 20 mg by mouth daily.    [provider]  senna (SENOKOT) 8.6 MG TABS tablet Take 1 tablet by mouth daily. Patient not taking: Reported on 04/13/2024    [provider]  tamsulosin  (FLOMAX ) 0.4 MG CAPS capsule TAKE 1 CAPSULE BY MOUTH EVERYDAY AT BEDTIME 10/01/23   Anthony Donald HERO, DO    Physical Exam    Vital Signs:  Anthony [name] does not have vital signs available for review today.  Given telephonic nature of communication, physical exam is limited. AAOx3. NAD. Normal affect.  Speech and respirations are unlabored.  Accessory Clinical Findings    None  Assessment & Plan    1.  Preoperative Cardiovascular Risk Assessment:    Anthony [name] perioperative risk of a major cardiac event is 0.9% according to the Revised Cardiac Risk Index (RCRI).  Therefore, he is at low risk for perioperative complications.   His functional capacity is good at 5.07 METs according to the [place] Activity Status Index (DASI). Recommendations: According to ACC/AHA guidelines, no further cardiovascular testing needed.  The patient may proceed to surgery at acceptable risk.    Eliquis  (Apixaban ) can be held for 3 days prior to surgery.  Please resume post op when felt to be safe.      The patient was advised that if he develops new symptoms prior to surgery to contact our office to arrange for a follow-up visit, and he verbalized understanding.    A copy of this note will be routed to requesting surgeon.  Time:   Today, I have spent 10 minutes with the patient with telehealth technology discussing medical history, symptoms, and management plan.     Anthony JAYSON Hoover, NP  04/19/2024, 8:25 AM

## 2024-04-25 NOTE — Progress Notes (Deleted)
   SUBJECTIVE:   CHIEF COMPLAINT / HPI:   Discussed the use of AI scribe software for clinical note transcription with the patient, who gave verbal consent to proceed.  History of Present Illness   Saw nutrition Tresiba  28u daily, novolog  6 at lunch, 10 dinner   Lumbar ESI? Eliquis  held 3 days prior to procedure  Previous intolerance to jardiance   Upcoming GI appt in january  OBJECTIVE:   There were no vitals taken for this visit.  Gen: well appearing, in NAD Card: RRR Lungs: CTAB Ext: WWP, no edema ***  ASSESSMENT/PLAN:   No problem-specific Assessment & Plan notes found for this encounter.     Assessment and Plan Assessment & Plan         Donald CHRISTELLA Lai, DO

## 2024-04-26 ENCOUNTER — Ambulatory Visit: Admitting: Family Medicine

## 2024-05-07 ENCOUNTER — Other Ambulatory Visit: Payer: Self-pay | Admitting: Family

## 2024-05-07 ENCOUNTER — Other Ambulatory Visit: Payer: Self-pay | Admitting: Family Medicine

## 2024-05-07 DIAGNOSIS — I4821 Permanent atrial fibrillation: Secondary | ICD-10-CM

## 2024-05-07 DIAGNOSIS — I428 Other cardiomyopathies: Secondary | ICD-10-CM

## 2024-05-13 ENCOUNTER — Encounter: Attending: Family Medicine | Admitting: Dietician

## 2024-05-13 ENCOUNTER — Encounter: Payer: Self-pay | Admitting: Dietician

## 2024-05-13 VITALS — Wt 208.0 lb

## 2024-05-13 DIAGNOSIS — Z794 Long term (current) use of insulin: Secondary | ICD-10-CM | POA: Diagnosis present

## 2024-05-13 DIAGNOSIS — E119 Type 2 diabetes mellitus without complications: Secondary | ICD-10-CM | POA: Diagnosis present

## 2024-05-13 NOTE — Progress Notes (Signed)
 Diabetes Self-Management Education  Visit Type: Follow-up  Appt. Start Time: 0850 Appt. End Time: 0940  05/13/2024  [name], identified by name and date of birth, is a 73 y.o. male with a diagnosis of Diabetes: Type 2.   ASSESSMENT Patient is here today with his wife.  He was last seen by this RD on 04/16/2024. Patient and wife states that his strength is improving.   He has lost weight but has not been trying to lose. Blood glucose is very high. He states that he rarely forgets his medication but after further discussion with patient and wife, he is likely forgetting.  He will also take his Novolog  and not eat. He states that he is afraid of running out of the Tresiba .  He has a prescription and pharmacy was called to refill.  He states that there is no difficulty affording his medications. Wife was instructed on blood glucose goals and when to call the doctor.  Instructed patient to eat consistent meals and that he needs to eat after taking the Novolog .  Pt walks with a cane; needed assistance with wheelchair for visit.  Pt reports 2 falls in the last 6 months, resulting in injury.  Reports hip, leg, and nerve pain due to fall, broken rib and scraped head/face. He states that they use no added salt but they do use a lot of processed foods. He reports always taking his medication and rotating his insulin  injection sites. Her verbalizes proper treatment for low blood sugar but this was also reviewed. Patient may benefit from [facility].   History:  ED visit on 02/06/24 due to Syncope, fall, and hypoglycemia. CHF, T2DM (~1986), OSA (uses CPAP), GERD, HTN, HLD, CKD, CVA, MI Medications/Supplements include:  Tresiba  28 units per a.m. Novolog  10 units before breakfast and dinner. 6 units before lunch. Oxycodone  2x/day, Lasix , Omeprazole , MVI, Vitamin D Labs noted to include:  A1C 13.3% on 02/07/24, increased from 9.2% on 01/28/23. BUN 34 on 03/26/24. eGFR 32 03/26/24.  Creatinine 2.13 on 03/26/24. Potassium 4.6 on 03/26/24.    CGM Libre 3+. Current sensor reading is 31 and was >350 when he woke up.  CGM Results from download: 05/13/2024  % Time CGM active:    %   (Goal >70%)  Average glucose:   310 mg/dL for 14 days  Glucose management indicator:   82 %  Time in range (70-180 mg/dL):   11 %   (Goal >29%)  Time High (181-250 mg/dL):    %   (Goal < 74%)  Time Very High (>250 mg/dL):   10%   (Goal < 5%)  Time Low (54-69 mg/dL):   0 %   (Goal <5%)  Time Very Low (<54 mg/dL):   0 %   (Goal <8%)  %CV (glucose variability)     %  (Goal <36%)   Reports Ht is 6'1 208 lbs 05/13/2024 219 lbs 04/13/2024 189 lbs 03/26/2024   Patient lives with his wife. Pt reports wife does the shopping and cooking for home.  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  Pt reports 3 hypoglycemic episodes in the last month and states that he tends to over treat a low.  Weight 208 lb (94.3 kg). Body mass index is 28.21 kg/m.   Diabetes Self-Management Education - 05/13/24 1252       Visit Information   Visit Type Follow-up      Initial Visit   Diabetes Type Type 2  Date Diagnosed 1986    Are you currently following a meal plan? Yes    Are you taking your medications as prescribed? No      Psychosocial Assessment   What is the hardest part about your diabetes right now, causing you the most concern, or is the most worrisome to you about your diabetes?   Making healty food and beverage choices;Getting support / problem solving    Self-care barriers Debilitated state due to current medical condition    Self-management support Doctor's office;Family;CDE visits    Other persons present Patient;Spouse/SO    Patient Concerns Nutrition/Meal planning;Medication;Problem Solving;Glycemic Control;Support    Special Needs Simplified materials;Instruct caregiver    Preferred Learning Style No preference indicated    Learning Readiness Ready    How often do you need to have  someone help you when you read instructions, pamphlets, or other written materials from your doctor or pharmacy? 1 - Never      Pre-Education Assessment   Patient understands the diabetes disease and treatment process. Needs Review    Patient understands incorporating nutritional management into lifestyle. Needs Review    Patient undertands incorporating physical activity into lifestyle. Needs Review    Patient understands using medications safely. Needs Review    Patient understands monitoring blood glucose, interpreting and using results Needs Review    Patient understands prevention, detection, and treatment of acute complications. Needs Review    Patient understands prevention, detection, and treatment of chronic complications. Needs Review    Patient understands how to develop strategies to address psychosocial issues. Needs Review    Patient understands how to develop strategies to promote health/change behavior. Needs Review      Complications   How often do you check your blood sugar? > 4 times/day    Fasting Blood glucose range (mg/dL) >799    Postprandial Blood glucose range (mg/dL) >799    Number of hypoglycemic episodes per month 0    Can you tell when your blood sugar is low? Yes      Dietary Intake   Breakfast NABS or plain cheerios, 2% milk    Lunch skips    Snack (afternoon) NABS    Dinner 2 Steakums sandwiches on buns    Snack (evening) none    Beverage(s) water, diet Pepsi, occasional LS tomato juice, occasional sugar free gatorade      Activity / Exercise   Activity / Exercise Type ADL's      Patient Education   Previous Diabetes Education Yes   04/16/2024   Healthy Eating Plate Method;Meal options for control of blood glucose level and chronic complications.    Medications Reviewed patients medication for diabetes, action, purpose, timing of dose and side effects.    Monitoring Taught/evaluated CGM (comment)    Chronic complications Identified and discussed  with patient  current chronic complications    Diabetes Stress and Support Identified and addressed patients feelings and concerns about diabetes;Worked with patient to identify barriers to care and solutions      Individualized Goals (developed by patient)   Nutrition General guidelines for healthy choices and portions discussed    Physical Activity Not Applicable    Medications take my medication as prescribed    Monitoring  Consistenly use CGM    Problem Solving Eating Pattern;Addressing barriers to behavior change    Reducing Risk treat hypoglycemia with 15 grams of carbs if blood glucose less than 70mg /dL;examine blood glucose patterns      Patient Self-Evaluation of Goals -  Patient rates self as meeting previously set goals (% of time)   Nutrition 25 - 50% (sometimes)    Physical Activity < 25% (hardly ever/never)    Medications 50 - 75 % (half of the time)    Monitoring >75% (most of the time)    Problem Solving and behavior change strategies  50 - 75 % (half of the time)    Reducing Risk (treating acute and chronic complications) 25 - 50% (sometimes)    Health Coping 50 - 75 % (half of the time)      Post-Education Assessment   Patient understands the diabetes disease and treatment process. Comprehends key points    Patient understands incorporating nutritional management into lifestyle. Needs Review    Patient undertands incorporating physical activity into lifestyle. N/A    Patient understands using medications safely. Needs Review    Patient understands monitoring blood glucose, interpreting and using results Needs Review    Patient understands prevention, detection, and treatment of acute complications. Needs Review    Patient understands prevention, detection, and treatment of chronic complications. Needs Review    Patient understands how to develop strategies to address psychosocial issues. Needs Review    Patient understands how to develop strategies to promote  health/change behavior. Needs Review      Outcomes   Expected Outcomes Demonstrated interest in learning but significant barriers to change    Future DMSE 4-6 wks    Program Status Not Completed      Subsequent Visit   Since your last visit have you continued or begun to take your medications as prescribed? No    Since your last visit have you experienced any weight changes? Loss    Weight Loss (lbs) 11    Since your last visit, are you checking your blood glucose at least once a day? Yes          Individualized Plan for Diabetes Self-Management Training:   Learning Objective:  Patient will have a greater understanding of diabetes self-management. Patient education plan is to attend individual and/or group sessions per assessed needs and concerns.   Plan:   Patient Instructions  Be very consistent with your insulin .  If your blood glucose remains high, call your doctor.  Avoid dark soda.  Drink more water, choose occasional diet gingerale.  Choose Breakfast, Lunch, and Dinner daily.  Blood glucose goals:  80-130 fasting   100-180 two hours after starting any meal  Generally a rise of 40-60 points after a meal is normal  A1C Goal:   Less than 7%.  Your last A1C was  Lab Results  Component Value Date   HGBA1C 13.3 (H) 02/07/2024       Expected Outcomes:  Demonstrated interest in learning but significant barriers to change  Education material provided:  NKD national kidney diet - Dish up a Kidney-Friendly Meal for Patients with Chronic Kidney Disease (not on dialysis)  If problems or questions, patient to contact team via:  Phone  Future DSME appointment: 4-6 wks

## 2024-05-13 NOTE — Patient Instructions (Addendum)
 Be very consistent with your insulin .  If your blood glucose remains high, call your doctor.  Avoid dark soda.  Drink more water, choose occasional diet gingerale.  Choose Breakfast, Lunch, and Dinner daily.  Blood glucose goals:  80-130 fasting   100-180 two hours after starting any meal  Generally a rise of 40-60 points after a meal is normal  A1C Goal:   Less than 7%.  Your last A1C was  Lab Results  Component Value Date   HGBA1C 13.3 (H) 02/07/2024

## 2024-05-31 ENCOUNTER — Ambulatory Visit

## 2024-05-31 DIAGNOSIS — I4821 Permanent atrial fibrillation: Secondary | ICD-10-CM

## 2024-06-01 ENCOUNTER — Other Ambulatory Visit: Payer: Self-pay | Admitting: Physician Assistant

## 2024-06-01 ENCOUNTER — Ambulatory Visit: Payer: Self-pay | Admitting: Cardiology

## 2024-06-01 ENCOUNTER — Ambulatory Visit: Admitting: Gastroenterology

## 2024-06-01 DIAGNOSIS — I4821 Permanent atrial fibrillation: Secondary | ICD-10-CM

## 2024-06-01 DIAGNOSIS — I428 Other cardiomyopathies: Secondary | ICD-10-CM

## 2024-06-01 LAB — CUP PACEART REMOTE DEVICE CHECK
Battery Remaining Longevity: 60 mo
Battery Remaining Percentage: 68 %
Brady Statistic RA Percent Paced: 0 %
Brady Statistic RV Percent Paced: 96 %
Date Time Interrogation Session: 20260105030100
HighPow Impedance: 66 Ohm
Implantable Lead Connection Status: 753985
Implantable Lead Connection Status: 753985
Implantable Lead Implant Date: 20190301
Implantable Lead Implant Date: 20190301
Implantable Lead Location: 753858
Implantable Lead Location: 753860
Implantable Pulse Generator Implant Date: 20231222
Lead Channel Impedance Value: 587 Ohm
Lead Channel Impedance Value: 706 Ohm
Lead Channel Pacing Threshold Amplitude: 0.5 V
Lead Channel Pacing Threshold Pulse Width: 0.4 ms
Lead Channel Setting Pacing Amplitude: 2 V
Lead Channel Setting Pacing Amplitude: 4 V
Lead Channel Setting Pacing Pulse Width: 0.4 ms
Lead Channel Setting Pacing Pulse Width: 1.5 ms
Lead Channel Setting Sensing Sensitivity: 0.6 mV
Lead Channel Setting Sensing Sensitivity: 1 mV
Pulse Gen Serial Number: 285896

## 2024-06-01 NOTE — Progress Notes (Deleted)
 "  Anthony [name] [id number] [birth year]   Chief Complaint:  Referring Provider: Madelon Donald HERO, DO Primary GI MD: Sampson (previous Dr. Aneita)  HPI: Anthony [name] is a 74 y.o. male with past medical history of colon polyps, CAD, A-fib, CHF, CKD, depression, diabetes, GERD, HLD, HTN, previous MI, OSA, ICD in place, seizures, prior stroke, sleep apnea who presents today for a complaint of *** .    Last colonoscopy done by Dr. Aneita in 2020 with multiple adenomatous polyps removed and recommended recall in 3 years.  Patient is referred by PCP for colonoscopy due to anemia, change in bowel habits, and dark stools.  Last CBC 03/26/2024 with stable anemia, hemoglobin 11.1, mild thrombocytopenia with platelets 148  Has appointment with PCP 06/07/2024.  Last visit with cardiology 04/19/2024, done virtually, for preop clearance of lumbar epidural steroid injection.    Discussed the use of AI scribe software for clinical note transcription with the patient, who gave verbal consent to proceed.  History of Present Illness       Previous GI Procedures/Imaging   Colonoscopy 02/08/2019 - Six 6 to 8 mm polyps in the transverse colon, removed with a cold snare. Resected and retrieved.  - Two 7 to 8 mm polyps in the descending colon, removed with a cold snare. Resected and retrieved.  - Ten 6 to 8 mm polyps in the sigmoid colon, removed with a cold snare. Resected and retrieved.  - Mild diverticulosis in the left colon.  - Internal hemorrhoids.  - The examination was otherwise normal on direct and retroflexion views. - Recall 3 years Path: 1. Colon, polyp(s), transverse - MULTIPLE FRAGMENTS OF TUBULAR ADENOMA(S) - NO HIGH GRADE DYSPLASIA OR MALIGNANCY IDENTIFIED 2. Colon, polyp(s), descending - MULTIPLE FRAGMENTS OF HYPERPLASTIC POLYP(S) - NO HIGH GRADE DYSPLASIA OR MALIGNANCY IDENTIFIED  Past Medical History:  Diagnosis Date   Arthritis    Benign neoplasm of descending colon     Benign neoplasm of sigmoid colon    Benign neoplasm of transverse colon    CAD in native artery    a. reported MI 2000, 2001, s/p stenting of the circumflex lesion extending into the second obtuse marginal branch with a drug-eluting stent in 2003. b. low risk nuc 2015.   Chest pain at rest 01/27/2024   Chronic atrial fibrillation (HCC)    Chronic combined systolic and diastolic CHF (congestive heart failure) (HCC)    a. Previously diastolic, then EF 25-30% in 10/2016.   CKD (chronic kidney disease), stage III (HCC)    Depression    Diabetes mellitus    Edema of extremities    Erectile dysfunction    GERD (gastroesophageal reflux disease)    Hyperlipidemia    Hypertension    Myocardial infarction (HCC) 2000&2001   Obesity    Onychomycosis    OSA (obstructive sleep apnea)    S/P ICD (internal cardiac defibrillator) procedure and BiV device,  07/25/17 Medtronic  07/26/2017   Seizures (HCC)    Sleep apnea    Stroke (HCC)    Tubular adenoma of colon 10/2002   Type 2 diabetes mellitus with hyperosmolar hyperglycemic state (HHS) (HCC) 01/26/2024    Past Surgical History:  Procedure Laterality Date   ANGIOPLASTY  2001   stent x 1   AV NODE ABLATION N/A 08/20/2017   Procedure: AV NODE ABLATION;  Surgeon: Fernande Elspeth BROCKS, MD;  Location: [facility] INVASIVE CV LAB;  Service: Cardiovascular;  Laterality: N/A;   BIV ICD GENERATOR CHANGEOUT N/A 05/17/2022  Procedure: BIV ICD GENERATOR CHANGEOUT;  Surgeon: Fernande Elspeth BROCKS, MD;  Location: [facility] INVASIVE CV LAB;  Service: Cardiovascular;  Laterality: N/A;   BIV ICD INSERTION CRT-D N/A 07/25/2017   Procedure: BIV ICD INSERTION CRT-D;  Surgeon: Fernande Elspeth BROCKS, MD;  Location: [facility] INVASIVE CV LAB;  Service: Cardiovascular;  Laterality: N/A;   COLONOSCOPY  2000?   negative   COLONOSCOPY WITH PROPOFOL  N/A 02/08/2019   Procedure: COLONOSCOPY WITH PROPOFOL ;  Surgeon: Aneita Gwendlyn DASEN, MD;  Location: [facility] ENDOSCOPY;  Service: Endoscopy;  Laterality: N/A;   FOOT  ARTHROTOMY Right    POLYPECTOMY  02/08/2019   Procedure: POLYPECTOMY;  Surgeon: Aneita Gwendlyn DASEN, MD;  Location: [facility] ENDOSCOPY;  Service: Endoscopy;;   TOE AMPUTATION Left 2012    Current Outpatient Medications  Medication Sig Dispense Refill   acetaminophen  (TYLENOL ) 650 MG CR tablet Take 1,300 mg by mouth 2 (two) times daily as needed for pain.     apixaban  (ELIQUIS ) 5 MG TABS tablet Take 1 tablet (5 mg total) by mouth 2 (two) times daily. 84 tablet    baclofen (LIORESAL) 10 MG tablet Take 10 mg by mouth every 6 (six) hours as needed for muscle spasms. (Patient not taking: Reported on 04/13/2024)     BD PEN NEEDLE NANO 2ND GEN 32G X 4 MM MISC USE AS DIRECTED 200 each 11   Blood Glucose Monitoring Suppl (BLOOD GLUCOSE MONITOR SYSTEM) w/Device KIT Use 3 (three) times daily. 1 kit 0   calcitRIOL  (ROCALTROL ) 0.5 MCG capsule Take 0.5 mcg by mouth daily. (Patient not taking: Reported on 04/16/2024)     carvedilol  (COREG ) 25 MG tablet TAKE 1 TABLET BY MOUTH TWICE A DAY 90 tablet 0   Continuous Glucose Sensor (FREESTYLE LIBRE 3 PLUS SENSOR) MISC Change sensor every 15 days. 2 each 11   dorzolamide -timolol  (COSOPT ) 2-0.5 % ophthalmic solution Place 1 drop into both eyes 2 (two) times daily.     feeding supplement (ENSURE PLUS HIGH PROTEIN) LIQD Take 237 mLs by mouth 2 (two) times daily between meals. (Patient not taking: Reported on 04/16/2024)     furosemide  (LASIX ) 20 MG tablet Take 3 tablets (60 mg total) by mouth daily. 270 tablet 0   gabapentin  (NEURONTIN ) 100 MG capsule Take 2 capsules (200 mg total) by mouth every 8 (eight) hours.     Glucose Blood (BLOOD GLUCOSE TEST STRIPS) STRP Use 3 (three) times daily. Use as directed to check blood sugar. 100 strip 0   insulin  aspart (NOVOLOG ) 100 UNIT/ML FlexPen Inject 6-10 Units into the skin 2 (two) times daily before a meal. Inject 6 units at lunch time and 10 units at dinner.     insulin  degludec (TRESIBA  FLEXTOUCH) 100 UNIT/ML FlexTouch Pen Inject 28  Units into the skin daily.     Lancet Device MISC Use 3 (three) times daily. May dispense any manufacturer covered by patient's insurance. 1 each 0   Lancets (ONETOUCH DELICA PLUS LANCET33G) MISC Use 1 each 3 (three) times daily. Use as directed to check blood sugar. 100 each 0   latanoprost  (XALATAN ) 0.005 % ophthalmic solution Place 1 drop into both eyes at bedtime.     lidocaine  (LIDODERM ) 5 % Place 1 patch onto the skin daily. Remove & Discard patch within 12 hours or as directed by MD (Patient not taking: Reported on 04/13/2024)     linaclotide (LINZESS) 145 MCG CAPS capsule Take 145 mcg by mouth daily before breakfast.     loratadine  (CLARITIN ) 10 MG tablet Take  10 mg by mouth daily.     losartan -hydrochlorothiazide (HYZAAR) 50-12.5 MG tablet TAKE 1 TABLET BY MOUTH EVERY DAY 90 tablet 3   Multiple Vitamin (MULTIVITAMIN) tablet Take 1 tablet by mouth daily.       nitroGLYCERIN  (NITROSTAT ) 0.4 MG SL tablet Place 1 tablet (0.4 mg total) under the tongue every 5 (five) minutes as needed for chest pain. If no relief by 3rd tab, call 911 (Patient not taking: Reported on 04/16/2024) 25 tablet 3   omeprazole  (PRILOSEC) 20 MG capsule Take 20 mg by mouth daily.     Oxycodone  HCl 10 MG TABS Take 10 mg by mouth in the morning, at noon, and at bedtime.     PARoxetine  (PAXIL ) 30 MG tablet TAKE 1 TABLET BY MOUTH EVERY DAY 90 tablet 3   polyethylene glycol (MIRALAX  / GLYCOLAX ) 17 g packet Take 17 g by mouth once. (Patient not taking: Reported on 04/13/2024)     rosuvastatin  (CRESTOR ) 20 MG tablet TAKE 1 TABLET BY MOUTH EVERY DAY 30 tablet 0   senna (SENOKOT) 8.6 MG TABS tablet Take 1 tablet by mouth daily. (Patient not taking: Reported on 04/13/2024)     tamsulosin  (FLOMAX ) 0.4 MG CAPS capsule TAKE 1 CAPSULE BY MOUTH EVERYDAY AT BEDTIME 90 capsule 3   No current facility-administered medications for this visit.    Allergies as of 06/01/2024 - Review Complete 05/13/2024  Allergen Reaction Noted    Enalapril maleate Cough 08/19/2005    Family History  Problem Relation Age of Onset   Heart attack Mother    CVA Mother    Diabetes Mother    Heart attack Father    Colon cancer Neg Hx    Stomach cancer Neg Hx     Social History[1]   Review of Systems:    Constitutional: No weight loss, fever, chills, weakness or fatigue Eyes: No change in vision Ears, Nose, Throat:  No change in hearing or congestion Skin: No rash or itching Cardiovascular: No chest pain, chest pressure or palpitations   Respiratory: No SOB or cough Gastrointestinal: See HPI and otherwise negative Genitourinary: No dysuria or change in urinary frequency Neurological: No headache, dizziness or syncope Musculoskeletal: No new muscle or joint pain Hematologic: No bleeding or bruising    Physical Exam:  Vital signs: There were no vitals taken for this visit.  Wt Readings from Last 3 Encounters:  05/13/24 208 lb (94.3 kg)  04/13/24 219 lb 3.2 oz (99.4 kg)  03/26/24 189 lb (85.7 kg)     Constitutional: NAD, Well developed, Well nourished, alert and cooperative Head:  Normocephalic and atraumatic.  Eyes: No scleral icterus. Conjunctiva pink. Mouth: No oral lesions. Respiratory: Respirations even and unlabored. Lungs clear to auscultation bilaterally.  No wheezes, crackles, or rhonchi.  Cardiovascular:  Regular rate and rhythm. No murmurs. No peripheral edema. Gastrointestinal:  Soft, nondistended, nontender. No rebound or guarding. Normal bowel sounds. No appreciable masses or hepatomegaly. Rectal:  Not performed.  Neurologic:  Alert and oriented x4;  grossly normal neurologically.  Skin:   Dry and intact without significant lesions or rashes. Psychiatric: Oriented to person, place and time. Demonstrates good judgement and reason without abnormal affect or behaviors.   Echocardiogram 02/16/2021 1. Left ventricular ejection fraction, by estimation, is 60 to 65% . The left ventricle has normal function.  The left ventricle has no regional wall motion abnormalities. There is mild left ventricular hypertrophy. Left ventricular diastolic parameters are indeterminate.  2. Pacing wires in RA/ RV. Right ventricular  systolic function is normal. The right ventricular size is normal.  3. Left atrial size was moderately dilated.  4. The mitral valve is abnormal. Trivial mitral valve regurgitation. No evidence of mitral stenosis.  5. The aortic valve was not well visualized. There is mild calcification of the aortic valve. Aortic valve regurgitation is not visualized. Mild aortic valve sclerosis is present, with no evidence of aortic valve stenosis.  6. The inferior vena cava is normal in size with greater than 50% respiratory variability, suggesting right atrial pressure of 3 mmHg.   Assessment/Plan:   Assessment & Plan   Last colonoscopy done in hospital setting due to EF of 30%.  Most recent echocardiogram in 2022 shows LVEF of 60 to 65%. Can tentatively schedule procedure in [facility] and send to Nor[facility] Schillings for approval May require cardiac clearance and permission to hold blood thinner (Eliquis )     Camie Furbish, PA-C [facility] 06/01/2024, 10:54 AM  Patient Care Team: Madelon Donald HERO, DO as PCP - General (Family Medicine) Okey Vina GAILS, MD as PCP - Cardiology (Cardiology) Kennyth Chew, MD as PCP - Electrophysiology (Cardiology) Letha Cancer, MD as Consulting Physician (Physical Medicine and Rehabilitation) Betsey Channel, MD as Consulting Physician (Nephrology) Pa, [facility] Leslee Reusing, MD as Consulting Physician (Ophthalmology)      [1]  Social History Tobacco Use   Smoking status: Former    Current packs/day: 0.00    Average packs/day: 0.3 packs/day for 51.4 years (12.9 ttl pk-yrs)    Types: Cigarettes    Start date: 05/28/1967    Quit date: 10/28/2018    Years since quitting: 5.5    Passive exposure: Past   Smokeless tobacco: Never  Vaping  Use   Vaping status: Never Used  Substance Use Topics   Alcohol use: No    Comment: quit in 1993   Drug use: No   "

## 2024-06-04 NOTE — Progress Notes (Signed)
 Remote ICD Transmission

## 2024-06-05 ENCOUNTER — Inpatient Hospital Stay (HOSPITAL_COMMUNITY)
Admission: EM | Admit: 2024-06-05 | Discharge: 2024-06-14 | DRG: 871 | Disposition: A | Discharge status: Skilled Nursing Facility | Attending: Family Medicine | Admitting: Family Medicine

## 2024-06-05 ENCOUNTER — Emergency Department (HOSPITAL_COMMUNITY)

## 2024-06-05 ENCOUNTER — Inpatient Hospital Stay (HOSPITAL_COMMUNITY)

## 2024-06-05 DIAGNOSIS — A419 Sepsis, unspecified organism: Principal | ICD-10-CM | POA: Diagnosis present

## 2024-06-05 DIAGNOSIS — I160 Hypertensive urgency: Secondary | ICD-10-CM | POA: Diagnosis present

## 2024-06-05 DIAGNOSIS — J189 Pneumonia, unspecified organism: Secondary | ICD-10-CM | POA: Diagnosis present

## 2024-06-05 DIAGNOSIS — I252 Old myocardial infarction: Secondary | ICD-10-CM

## 2024-06-05 DIAGNOSIS — F0153 Vascular dementia, unspecified severity, with mood disturbance: Secondary | ICD-10-CM | POA: Diagnosis present

## 2024-06-05 DIAGNOSIS — J690 Pneumonitis due to inhalation of food and vomit: Secondary | ICD-10-CM | POA: Diagnosis present

## 2024-06-05 DIAGNOSIS — G9341 Metabolic encephalopathy: Secondary | ICD-10-CM | POA: Diagnosis present

## 2024-06-05 DIAGNOSIS — G928 Other toxic encephalopathy: Secondary | ICD-10-CM | POA: Diagnosis present

## 2024-06-05 DIAGNOSIS — F32A Depression, unspecified: Secondary | ICD-10-CM | POA: Diagnosis present

## 2024-06-05 DIAGNOSIS — E1110 Type 2 diabetes mellitus with ketoacidosis without coma: Secondary | ICD-10-CM | POA: Diagnosis present

## 2024-06-05 DIAGNOSIS — I130 Hypertensive heart and chronic kidney disease with heart failure and stage 1 through stage 4 chronic kidney disease, or unspecified chronic kidney disease: Secondary | ICD-10-CM | POA: Diagnosis present

## 2024-06-05 DIAGNOSIS — Z7901 Long term (current) use of anticoagulants: Secondary | ICD-10-CM | POA: Diagnosis not present

## 2024-06-05 DIAGNOSIS — I10 Essential (primary) hypertension: Secondary | ICD-10-CM | POA: Diagnosis present

## 2024-06-05 DIAGNOSIS — R6520 Severe sepsis without septic shock: Secondary | ICD-10-CM | POA: Diagnosis present

## 2024-06-05 DIAGNOSIS — D6959 Other secondary thrombocytopenia: Secondary | ICD-10-CM | POA: Diagnosis present

## 2024-06-05 DIAGNOSIS — Z794 Long term (current) use of insulin: Secondary | ICD-10-CM

## 2024-06-05 DIAGNOSIS — R339 Retention of urine, unspecified: Secondary | ICD-10-CM | POA: Diagnosis not present

## 2024-06-05 DIAGNOSIS — K219 Gastro-esophageal reflux disease without esophagitis: Secondary | ICD-10-CM | POA: Diagnosis present

## 2024-06-05 DIAGNOSIS — N184 Chronic kidney disease, stage 4 (severe): Secondary | ICD-10-CM

## 2024-06-05 DIAGNOSIS — N1831 Chronic kidney disease, stage 3a: Secondary | ICD-10-CM | POA: Diagnosis present

## 2024-06-05 DIAGNOSIS — Z79891 Long term (current) use of opiate analgesic: Secondary | ICD-10-CM

## 2024-06-05 DIAGNOSIS — E119 Type 2 diabetes mellitus without complications: Secondary | ICD-10-CM

## 2024-06-05 DIAGNOSIS — E785 Hyperlipidemia, unspecified: Secondary | ICD-10-CM | POA: Diagnosis present

## 2024-06-05 DIAGNOSIS — R739 Hyperglycemia, unspecified: Secondary | ICD-10-CM

## 2024-06-05 DIAGNOSIS — I5042 Chronic combined systolic (congestive) and diastolic (congestive) heart failure: Secondary | ICD-10-CM | POA: Diagnosis present

## 2024-06-05 DIAGNOSIS — Z833 Family history of diabetes mellitus: Secondary | ICD-10-CM

## 2024-06-05 DIAGNOSIS — E11649 Type 2 diabetes mellitus with hypoglycemia without coma: Secondary | ICD-10-CM | POA: Diagnosis not present

## 2024-06-05 DIAGNOSIS — G8929 Other chronic pain: Secondary | ICD-10-CM | POA: Diagnosis present

## 2024-06-05 DIAGNOSIS — E440 Moderate protein-calorie malnutrition: Secondary | ICD-10-CM | POA: Insufficient documentation

## 2024-06-05 DIAGNOSIS — E876 Hypokalemia: Secondary | ICD-10-CM | POA: Diagnosis not present

## 2024-06-05 DIAGNOSIS — Z860101 Personal history of adenomatous and serrated colon polyps: Secondary | ICD-10-CM

## 2024-06-05 DIAGNOSIS — N170 Acute kidney failure with tubular necrosis: Secondary | ICD-10-CM | POA: Diagnosis present

## 2024-06-05 DIAGNOSIS — Z888 Allergy status to other drugs, medicaments and biological substances status: Secondary | ICD-10-CM

## 2024-06-05 DIAGNOSIS — R4182 Altered mental status, unspecified: Principal | ICD-10-CM

## 2024-06-05 DIAGNOSIS — E8729 Other acidosis: Secondary | ICD-10-CM

## 2024-06-05 DIAGNOSIS — I2510 Atherosclerotic heart disease of native coronary artery without angina pectoris: Secondary | ICD-10-CM | POA: Diagnosis present

## 2024-06-05 DIAGNOSIS — Z823 Family history of stroke: Secondary | ICD-10-CM

## 2024-06-05 DIAGNOSIS — Z9181 History of falling: Secondary | ICD-10-CM

## 2024-06-05 DIAGNOSIS — E1122 Type 2 diabetes mellitus with diabetic chronic kidney disease: Secondary | ICD-10-CM | POA: Diagnosis present

## 2024-06-05 DIAGNOSIS — T50915A Adverse effect of multiple unspecified drugs, medicaments and biological substances, initial encounter: Secondary | ICD-10-CM | POA: Diagnosis present

## 2024-06-05 DIAGNOSIS — Z87891 Personal history of nicotine dependence: Secondary | ICD-10-CM

## 2024-06-05 DIAGNOSIS — I482 Chronic atrial fibrillation, unspecified: Secondary | ICD-10-CM | POA: Diagnosis present

## 2024-06-05 DIAGNOSIS — I69319 Unspecified symptoms and signs involving cognitive functions following cerebral infarction: Secondary | ICD-10-CM

## 2024-06-05 DIAGNOSIS — E162 Hypoglycemia, unspecified: Secondary | ICD-10-CM | POA: Diagnosis not present

## 2024-06-05 DIAGNOSIS — N400 Enlarged prostate without lower urinary tract symptoms: Principal | ICD-10-CM | POA: Diagnosis present

## 2024-06-05 DIAGNOSIS — Z95810 Presence of automatic (implantable) cardiac defibrillator: Secondary | ICD-10-CM

## 2024-06-05 DIAGNOSIS — K5900 Constipation, unspecified: Secondary | ICD-10-CM | POA: Diagnosis present

## 2024-06-05 DIAGNOSIS — I428 Other cardiomyopathies: Secondary | ICD-10-CM

## 2024-06-05 DIAGNOSIS — I4821 Permanent atrial fibrillation: Secondary | ICD-10-CM

## 2024-06-05 DIAGNOSIS — Z79899 Other long term (current) drug therapy: Secondary | ICD-10-CM

## 2024-06-05 DIAGNOSIS — Z8249 Family history of ischemic heart disease and other diseases of the circulatory system: Secondary | ICD-10-CM

## 2024-06-05 LAB — URINALYSIS, ROUTINE W REFLEX MICROSCOPIC
Bacteria, UA: NONE SEEN
Bilirubin Urine: NEGATIVE
Glucose, UA: >=500 mg/dL — AB
Ketones, ur: 20 mg/dL — AB
Leukocytes,Ua: NEGATIVE
Nitrite: NEGATIVE
Protein, ur: >=300 mg/dL — AB
Specific Gravity, Urine: 1.014 (ref 1.005–1.030)
pH: 5 (ref 5.0–8.0)

## 2024-06-05 LAB — BETA-HYDROXYBUTYRIC ACID: Beta-Hydroxybutyric Acid: 4.66 mmol/L — ABNORMAL HIGH (ref 0.05–0.27)

## 2024-06-05 LAB — TROPONIN T, HIGH SENSITIVITY: Troponin T High Sensitivity: 77 ng/L — ABNORMAL HIGH (ref 0–19)

## 2024-06-05 LAB — CBC WITH DIFFERENTIAL/PLATELET
Abs Immature Granulocytes: 0.02 K/uL (ref 0.00–0.07)
Basophils Absolute: 0 K/uL (ref 0.0–0.1)
Basophils Relative: 0 %
Eosinophils Absolute: 0 K/uL (ref 0.0–0.5)
Eosinophils Relative: 1 %
HCT: 50.4 % (ref 39.0–52.0)
Hemoglobin: 16.5 g/dL (ref 13.0–17.0)
Immature Granulocytes: 0 %
Lymphocytes Relative: 15 %
Lymphs Abs: 1 K/uL (ref 0.7–4.0)
MCH: 29.4 pg (ref 26.0–34.0)
MCHC: 32.7 g/dL (ref 30.0–36.0)
MCV: 89.8 fL (ref 80.0–100.0)
Monocytes Absolute: 0.6 K/uL (ref 0.1–1.0)
Monocytes Relative: 9 %
Neutro Abs: 5.1 K/uL (ref 1.7–7.7)
Neutrophils Relative %: 75 %
Platelets: 41 K/uL — ABNORMAL LOW (ref 150–400)
RBC: 5.61 MIL/uL (ref 4.22–5.81)
RDW: 14.8 % (ref 11.5–15.5)
WBC: 6.8 K/uL (ref 4.0–10.5)
nRBC: 0 % (ref 0.0–0.2)

## 2024-06-05 LAB — BLOOD GAS, VENOUS
Acid-base deficit: 7.5 mmol/L — ABNORMAL HIGH (ref 0.0–2.0)
Bicarbonate: 17.4 mmol/L — ABNORMAL LOW (ref 20.0–28.0)
O2 Saturation: 68.1 %
Patient temperature: 37
pCO2, Ven: 33 mmHg — ABNORMAL LOW (ref 44–60)
pH, Ven: 7.33 (ref 7.25–7.43)
pO2, Ven: 41 mmHg (ref 32–45)

## 2024-06-05 LAB — CBG MONITORING, ED
Glucose-Capillary: 292 mg/dL — ABNORMAL HIGH (ref 70–99)
Glucose-Capillary: 387 mg/dL — ABNORMAL HIGH (ref 70–99)
Glucose-Capillary: 415 mg/dL — ABNORMAL HIGH (ref 70–99)

## 2024-06-05 LAB — COMPREHENSIVE METABOLIC PANEL WITH GFR
ALT: 23 U/L (ref 0–44)
AST: 27 U/L (ref 15–41)
Albumin: 3.7 g/dL (ref 3.5–5.0)
Alkaline Phosphatase: 143 U/L — ABNORMAL HIGH (ref 38–126)
Anion gap: 24 — ABNORMAL HIGH (ref 5–15)
BUN: 45 mg/dL — ABNORMAL HIGH (ref 8–23)
CO2: 19 mmol/L — ABNORMAL LOW (ref 22–32)
Calcium: 9.7 mg/dL (ref 8.9–10.3)
Chloride: 99 mmol/L (ref 98–111)
Creatinine, Ser: 2.58 mg/dL — ABNORMAL HIGH (ref 0.61–1.24)
GFR, Estimated: 26 mL/min — ABNORMAL LOW
Glucose, Bld: 472 mg/dL — ABNORMAL HIGH (ref 70–99)
Potassium: 3.6 mmol/L (ref 3.5–5.1)
Sodium: 141 mmol/L (ref 135–145)
Total Bilirubin: 1.8 mg/dL — ABNORMAL HIGH (ref 0.0–1.2)
Total Protein: 6.6 g/dL (ref 6.5–8.1)

## 2024-06-05 LAB — I-STAT CG4 LACTIC ACID, ED
Lactic Acid, Venous: 4.5 mmol/L (ref 0.5–1.9)
Lactic Acid, Venous: 1.9 mmol/L (ref 0.5–1.9)

## 2024-06-05 LAB — BASIC METABOLIC PANEL WITH GFR
Anion gap: 26 — ABNORMAL HIGH (ref 5–15)
Anion gap: 19 — ABNORMAL HIGH (ref 5–15)
BUN: 48 mg/dL — ABNORMAL HIGH (ref 8–23)
BUN: 48 mg/dL — ABNORMAL HIGH (ref 8–23)
CO2: 13 mmol/L — ABNORMAL LOW (ref 22–32)
CO2: 19 mmol/L — ABNORMAL LOW (ref 22–32)
Calcium: 9.3 mg/dL (ref 8.9–10.3)
Calcium: 9.4 mg/dL (ref 8.9–10.3)
Chloride: 102 mmol/L (ref 98–111)
Chloride: 103 mmol/L (ref 98–111)
Creatinine, Ser: 2.54 mg/dL — ABNORMAL HIGH (ref 0.61–1.24)
Creatinine, Ser: 2.51 mg/dL — ABNORMAL HIGH (ref 0.61–1.24)
GFR, Estimated: 26 mL/min — ABNORMAL LOW
GFR, Estimated: 27 mL/min — ABNORMAL LOW
Glucose, Bld: 416 mg/dL — ABNORMAL HIGH (ref 70–99)
Glucose, Bld: 414 mg/dL — ABNORMAL HIGH (ref 70–99)
Potassium: 3.8 mmol/L (ref 3.5–5.1)
Potassium: 3.9 mmol/L (ref 3.5–5.1)
Sodium: 141 mmol/L (ref 135–145)
Sodium: 140 mmol/L (ref 135–145)

## 2024-06-05 LAB — MAGNESIUM: Magnesium: 2 mg/dL (ref 1.7–2.4)

## 2024-06-05 LAB — AMMONIA: Ammonia: 32 umol/L (ref 9–35)

## 2024-06-05 MED ORDER — LOSARTAN POTASSIUM-HCTZ 50-12.5 MG PO TABS: 1.0000 | ORAL_TABLET | Freq: Every day | ORAL | Status: DC

## 2024-06-05 MED ORDER — LACTATED RINGERS IV SOLN: INTRAVENOUS | Status: DC

## 2024-06-05 MED ORDER — TAMSULOSIN HCL 0.4 MG PO CAPS
0.4000 mg | ORAL_CAPSULE | Freq: Every day | ORAL | Status: DC
  Administered 2024-06-06 – 2024-06-14 (×9): 0.4 mg via ORAL
  Filled 2024-06-05 (×10): qty 1

## 2024-06-05 MED ORDER — APIXABAN 5 MG PO TABS
5.0000 mg | ORAL_TABLET | Freq: Two times a day (BID) | ORAL | Status: DC
  Administered 2024-06-06 – 2024-06-14 (×18): 5 mg via ORAL
  Filled 2024-06-05 (×18): qty 1

## 2024-06-05 MED ORDER — INSULIN ASPART 100 UNIT/ML IJ SOLN
0.0000 [IU] | INTRAMUSCULAR | Status: DC
  Administered 2024-06-06: 12 [IU] via SUBCUTANEOUS
  Administered 2024-06-06: 2 [IU] via SUBCUTANEOUS
  Administered 2024-06-06 (×2): 4 [IU] via SUBCUTANEOUS
  Administered 2024-06-07: 2 [IU] via SUBCUTANEOUS
  Administered 2024-06-07: 4 [IU] via SUBCUTANEOUS
  Administered 2024-06-07: 8 [IU] via SUBCUTANEOUS
  Filled 2024-06-05: qty 4
  Filled 2024-06-05: qty 2
  Filled 2024-06-05: qty 1
  Filled 2024-06-05: qty 5
  Filled 2024-06-05: qty 4
  Filled 2024-06-05: qty 8
  Filled 2024-06-05: qty 4

## 2024-06-05 MED ORDER — PAROXETINE HCL 10 MG PO TABS
30.0000 mg | ORAL_TABLET | Freq: Every day | ORAL | Status: DC
  Administered 2024-06-07 – 2024-06-14 (×8): 30 mg via ORAL
  Filled 2024-06-05 (×2): qty 1
  Filled 2024-06-05 (×3): qty 3
  Filled 2024-06-05: qty 1
  Filled 2024-06-05 (×3): qty 3

## 2024-06-05 MED ORDER — ONDANSETRON HCL 4 MG PO TABS: 4.0000 mg | ORAL_TABLET | Freq: Four times a day (QID) | ORAL | Status: DC | PRN

## 2024-06-05 MED ORDER — INSULIN REGULAR(HUMAN) IN NACL 100-0.9 UT/100ML-% IV SOLN
INTRAVENOUS | Status: DC
  Administered 2024-06-05: 11.5 [IU]/h via INTRAVENOUS
  Filled 2024-06-05: qty 100

## 2024-06-05 MED ORDER — HALOPERIDOL LACTATE 5 MG/ML IJ SOLN
5.0000 mg | Freq: Once | INTRAMUSCULAR | Status: AC
  Administered 2024-06-05: 5 mg via INTRAMUSCULAR
  Filled 2024-06-05: qty 1

## 2024-06-05 MED ORDER — DEXTROSE 50 % IV SOLN: 0.0000 mL | INTRAVENOUS | Status: DC | PRN

## 2024-06-05 MED ORDER — ROSUVASTATIN CALCIUM 20 MG PO TABS
20.0000 mg | ORAL_TABLET | Freq: Every day | ORAL | Status: DC
  Administered 2024-06-06 – 2024-06-14 (×9): 20 mg via ORAL
  Filled 2024-06-05 (×9): qty 1

## 2024-06-05 MED ORDER — LOSARTAN POTASSIUM 50 MG PO TABS
50.0000 mg | ORAL_TABLET | Freq: Every day | ORAL | Status: DC
  Administered 2024-06-06 – 2024-06-09 (×4): 50 mg via ORAL
  Filled 2024-06-05 (×4): qty 1

## 2024-06-05 MED ORDER — DORZOLAMIDE HCL-TIMOLOL MAL 2-0.5 % OP SOLN
1.0000 [drp] | Freq: Two times a day (BID) | OPHTHALMIC | Status: DC
  Administered 2024-06-06 – 2024-06-14 (×15): 1 [drp] via OPHTHALMIC
  Filled 2024-06-05: qty 10

## 2024-06-05 MED ORDER — ENOXAPARIN SODIUM 40 MG/0.4ML IJ SOSY: 40.0000 mg | PREFILLED_SYRINGE | INTRAMUSCULAR | Status: DC

## 2024-06-05 MED ORDER — ACETAMINOPHEN 650 MG RE SUPP: 650.0000 mg | Freq: Four times a day (QID) | RECTAL | Status: DC | PRN

## 2024-06-05 MED ORDER — HYDROCHLOROTHIAZIDE 12.5 MG PO TABS
12.5000 mg | ORAL_TABLET | Freq: Every day | ORAL | Status: DC
  Administered 2024-06-06: 12.5 mg via ORAL
  Filled 2024-06-05: qty 1

## 2024-06-05 MED ORDER — INSULIN GLARGINE-YFGN 100 UNIT/ML ~~LOC~~ SOLN
20.0000 [IU] | Freq: Every day | SUBCUTANEOUS | Status: DC
  Administered 2024-06-05 – 2024-06-07 (×2): 20 [IU] via SUBCUTANEOUS
  Filled 2024-06-05 (×3): qty 0.2

## 2024-06-05 MED ORDER — CARVEDILOL 25 MG PO TABS
25.0000 mg | ORAL_TABLET | Freq: Two times a day (BID) | ORAL | Status: DC
  Administered 2024-06-06 – 2024-06-14 (×18): 25 mg via ORAL
  Filled 2024-06-05 (×2): qty 1
  Filled 2024-06-05: qty 2
  Filled 2024-06-05 (×15): qty 1
  Filled 2024-06-05: qty 2

## 2024-06-05 MED ORDER — LORAZEPAM 2 MG/ML IJ SOLN
1.0000 mg | Freq: Once | INTRAMUSCULAR | Status: AC
  Administered 2024-06-05: 1 mg via INTRAVENOUS
  Filled 2024-06-05: qty 1

## 2024-06-05 MED ORDER — ONDANSETRON HCL 4 MG/2ML IJ SOLN: 4.0000 mg | Freq: Four times a day (QID) | INTRAMUSCULAR | Status: DC | PRN

## 2024-06-05 MED ORDER — POTASSIUM CHLORIDE 10 MEQ/100ML IV SOLN
10.0000 meq | INTRAVENOUS | Status: DC
  Administered 2024-06-05: 10 meq via INTRAVENOUS
  Filled 2024-06-05: qty 100

## 2024-06-05 MED ORDER — LACTATED RINGERS IV BOLUS
1000.0000 mL | Freq: Once | INTRAVENOUS | Status: AC
  Administered 2024-06-05: 1000 mL via INTRAVENOUS

## 2024-06-05 MED ORDER — PANTOPRAZOLE SODIUM 40 MG PO TBEC
40.0000 mg | DELAYED_RELEASE_TABLET | Freq: Every day | ORAL | Status: DC
  Administered 2024-06-06 – 2024-06-14 (×9): 40 mg via ORAL
  Filled 2024-06-05 (×9): qty 1

## 2024-06-05 MED ORDER — LINACLOTIDE 145 MCG PO CAPS: 145.0000 ug | ORAL_CAPSULE | Freq: Every day | ORAL | Status: DC

## 2024-06-05 MED ORDER — VANCOMYCIN HCL 1750 MG/350ML IV SOLN
1750.0000 mg | INTRAVENOUS | Status: DC
  Administered 2024-06-06 – 2024-06-07 (×2): 1750 mg via INTRAVENOUS
  Filled 2024-06-05 (×2): qty 350

## 2024-06-05 MED ORDER — LATANOPROST 0.005 % OP SOLN
1.0000 [drp] | Freq: Every day | OPHTHALMIC | Status: DC
  Administered 2024-06-06 – 2024-06-13 (×8): 1 [drp] via OPHTHALMIC
  Filled 2024-06-05: qty 2.5

## 2024-06-05 MED ORDER — GABAPENTIN 300 MG PO CAPS
300.0000 mg | ORAL_CAPSULE | Freq: Three times a day (TID) | ORAL | Status: DC
  Administered 2024-06-06 – 2024-06-14 (×27): 300 mg via ORAL
  Filled 2024-06-05 (×27): qty 1

## 2024-06-05 MED ORDER — SODIUM CHLORIDE 0.9 % IV SOLN
2.0000 g | Freq: Once | INTRAVENOUS | Status: AC
  Administered 2024-06-06: 2 g via INTRAVENOUS
  Filled 2024-06-05: qty 12.5

## 2024-06-05 MED ORDER — DEXTROSE IN LACTATED RINGERS 5 % IV SOLN: INTRAVENOUS | Status: DC

## 2024-06-05 MED ORDER — ACETAMINOPHEN 325 MG PO TABS
650.0000 mg | ORAL_TABLET | Freq: Four times a day (QID) | ORAL | Status: DC | PRN
  Filled 2024-06-05: qty 2

## 2024-06-05 NOTE — ED Notes (Signed)
 Elevated glucose, MD aware.

## 2024-06-05 NOTE — ED Triage Notes (Addendum)
 X 1 week , burning with urination, painful urination. Confusion and weakness, from home. S/o called EMS. 20G, L. AC

## 2024-06-05 NOTE — H&P (Signed)
 " History and Physical    Anthony [name]:[id number] DOB: [birth year] DOA: 06/05/2024  PCP: Madelon Donald HERO, DO   Chief Complaint: lorre  HPI: DIMETRIUS [name] is a 74 y.o. male with medical history significant of A-fib, CHF, depression, hypertension, hyperlipidemia, CVA who presents emergency department altered mental status.  Patient has been complaining of dysuria and weakness.  EMS was called and he was transported to the ER for further assessment.  On arrival urinalysis showed no concern for infection, WBC 6.8, hemoglobin 16.5, platelets 41,000, glucose 415, creatinine 2.5 at baseline, bicarb 19, magnesium  2.0, troponin 77, beta-hydroxybutyrate 4.6, lactic acid 4.5.  Patient was given IV fluids and started insulin  drip.  Lactic acid improved to 1.9.  Chest x-ray showed no acute findings.  CT head showed no acute findings.  Patient was admitted due to concern for DKA.   Review of Systems: Review of Systems  Constitutional: Negative.  Negative for chills and fever.  HENT: Negative.    Eyes: Negative.   Respiratory: Negative.    Cardiovascular: Negative.   Gastrointestinal: Negative.   Genitourinary: Negative.   Musculoskeletal: Negative.   Skin: Negative.      As per HPI otherwise 10 point review of systems negative.   Allergies[1]  Past Medical History:  Diagnosis Date   Arthritis    Benign neoplasm of descending colon    Benign neoplasm of sigmoid colon    Benign neoplasm of transverse colon    CAD in native artery    a. reported MI 2000, 2001, s/p stenting of the circumflex lesion extending into the second obtuse marginal branch with a drug-eluting stent in 2003. b. low risk nuc 2015.   Chest pain at rest 01/27/2024   Chronic atrial fibrillation (HCC)    Chronic combined systolic and diastolic CHF (congestive heart failure) (HCC)    a. Previously diastolic, then EF 25-30% in 10/2016.   CKD (chronic kidney disease), stage III (HCC)    Depression    Diabetes mellitus     Edema of extremities    Erectile dysfunction    GERD (gastroesophageal reflux disease)    Hyperlipidemia    Hypertension    Myocardial infarction (HCC) 2000&2001   Obesity    Onychomycosis    OSA (obstructive sleep apnea)    S/P ICD (internal cardiac defibrillator) procedure and BiV device,  07/25/17 [employer]  07/26/2017   Seizures (HCC)    Sleep apnea    Stroke (HCC)    Tubular adenoma of colon 10/2002   Type 2 diabetes mellitus with hyperosmolar hyperglycemic state (HHS) (HCC) 01/26/2024    Past Surgical History:  Procedure Laterality Date   ANGIOPLASTY  2001   stent x 1   AV NODE ABLATION N/A 08/20/2017   Procedure: AV NODE ABLATION;  Surgeon: Fernande Elspeth BROCKS, MD;  Location: [facility] INVASIVE CV LAB;  Service: Cardiovascular;  Laterality: N/A;   BIV ICD GENERATOR CHANGEOUT N/A 05/17/2022   Procedure: BIV ICD GENERATOR CHANGEOUT;  Surgeon: Fernande Elspeth BROCKS, MD;  Location: [facility] INVASIVE CV LAB;  Service: Cardiovascular;  Laterality: N/A;   BIV ICD INSERTION CRT-D N/A 07/25/2017   Procedure: BIV ICD INSERTION CRT-D;  Surgeon: Fernande Elspeth BROCKS, MD;  Location: [facility] INVASIVE CV LAB;  Service: Cardiovascular;  Laterality: N/A;   COLONOSCOPY  2000?   negative   COLONOSCOPY WITH PROPOFOL  N/A 02/08/2019   Procedure: COLONOSCOPY WITH PROPOFOL ;  Surgeon: Aneita Gwendlyn DASEN, MD;  Location: [facility] ENDOSCOPY;  Service: Endoscopy;  Laterality: N/A;  FOOT ARTHROTOMY Right    POLYPECTOMY  02/08/2019   Procedure: POLYPECTOMY;  Surgeon: Aneita Gwendlyn DASEN, MD;  Location: [facility] ENDOSCOPY;  Service: Endoscopy;;   TOE AMPUTATION Left 2012     reports that he quit smoking about 5 years ago. His smoking use included cigarettes. He started smoking about 57 years ago. He has a 12.9 pack-year smoking history. He has been exposed to tobacco smoke. He has never used smokeless tobacco. He reports that he does not drink alcohol and does not use drugs.  Family History  Problem Relation Age of Onset   Heart attack Mother    CVA  Mother    Diabetes Mother    Heart attack Father    Colon cancer Neg Hx    Stomach cancer Neg Hx     Prior to Admission medications  Medication Sig Start Date End Date Taking? Authorizing Provider  acetaminophen  (TYLENOL ) 650 MG CR tablet Take 1,300 mg by mouth 2 (two) times daily as needed for pain.    [provider]  apixaban  (ELIQUIS ) 5 MG TABS tablet Take 1 tablet (5 mg total) by mouth 2 (two) times daily. 04/13/24   McDiarmid, Krystal BIRCH, MD  baclofen (LIORESAL) 10 MG tablet Take 10 mg by mouth every 6 (six) hours as needed for muscle spasms. Patient not taking: Reported on 04/13/2024    [provider]  BD PEN NEEDLE NANO 2ND GEN 32G X 4 MM MISC USE AS DIRECTED 03/04/24   Madelon Donald HERO, DO  Blood Glucose Monitoring Suppl (BLOOD GLUCOSE MONITOR SYSTEM) w/Device KIT Use 3 (three) times daily. 02/09/24   Tharon Lung, MD  calcitRIOL  (ROCALTROL ) 0.5 MCG capsule Take 0.5 mcg by mouth daily. Patient not taking: Reported on 04/16/2024 09/02/22   [provider]  carvedilol  (COREG ) 25 MG tablet TAKE 1 TABLET BY MOUTH TWICE A DAY 05/10/24   Rumball, Alison M, DO  Continuous Glucose Sensor (FREESTYLE LIBRE 3 PLUS SENSOR) MISC Change sensor every 15 days. 02/16/24   Rumball, Alison M, DO  dorzolamide -timolol  (COSOPT ) 2-0.5 % ophthalmic solution Place 1 drop into both eyes 2 (two) times daily. 12/16/23   [provider]  feeding supplement (ENSURE PLUS HIGH PROTEIN) LIQD Take 237 mLs by mouth 2 (two) times daily between meals. Patient not taking: Reported on 04/16/2024 01/30/24   Tharon Lung, MD  furosemide  (LASIX ) 20 MG tablet Take 3 tablets (60 mg total) by mouth daily. 11/06/23   Rumball, Alison M, DO  gabapentin  (NEURONTIN ) 100 MG capsule Take 2 capsules (200 mg total) by mouth every 8 (eight) hours. 01/30/24   Tharon Lung, MD  Glucose Blood (BLOOD GLUCOSE TEST STRIPS) STRP Use 3 (three) times daily. Use as directed to check blood sugar. 02/09/24   Tharon Lung, MD   insulin  aspart (NOVOLOG ) 100 UNIT/ML FlexPen Inject 6-10 Units into the skin 2 (two) times daily before a meal. Inject 6 units at lunch time and 10 units at dinner. 04/13/24   McDiarmid, Krystal BIRCH, MD  insulin  degludec (TRESIBA  FLEXTOUCH) 100 UNIT/ML FlexTouch Pen Inject 28 Units into the skin daily. 04/13/24   McDiarmid, Krystal BIRCH, MD  Lancet Device MISC Use 3 (three) times daily. May dispense any manufacturer covered by patient's insurance. 02/09/24   Tharon Lung, MD  Lancets [facility] DELICA PLUS [place]) MISC Use 1 each 3 (three) times daily. Use as directed to check blood sugar. 02/09/24   Tharon Lung, MD  latanoprost  (XALATAN ) 0.005 % ophthalmic solution Place 1 drop into both eyes at  bedtime. 09/03/22   [provider]  lidocaine  (LIDODERM ) 5 % Place 1 patch onto the skin daily. Remove & Discard patch within 12 hours or as directed by MD Patient not taking: Reported on 04/13/2024 01/30/24   Tharon Lung, MD  linaclotide  (LINZESS ) 145 MCG CAPS capsule Take 145 mcg by mouth daily before breakfast.    [provider]  loratadine  (CLARITIN ) 10 MG tablet Take 10 mg by mouth daily.    [provider]  losartan -hydrochlorothiazide  (HYZAAR) 50-12.5 MG tablet TAKE 1 TABLET BY MOUTH EVERY DAY 09/15/23   Rumball, Alison M, DO  Multiple Vitamin (MULTIVITAMIN) tablet Take 1 tablet by mouth daily.      [provider]  nitroGLYCERIN  (NITROSTAT ) 0.4 MG SL tablet Place 1 tablet (0.4 mg total) under the tongue every 5 (five) minutes as needed for chest pain. If no relief by 3rd tab, call 911 Patient not taking: Reported on 04/16/2024 10/04/22   McDiarmid, Krystal BIRCH, MD  omeprazole  (PRILOSEC) 20 MG capsule Take 20 mg by mouth daily.    [provider]  Oxycodone  HCl 10 MG TABS Take 10 mg by mouth in the morning, at noon, and at bedtime.    [provider]  PARoxetine  (PAXIL ) 30 MG tablet TAKE 1 TABLET BY MOUTH EVERY DAY 06/25/23   Rumball, Alison M, DO  polyethylene  glycol (MIRALAX  / GLYCOLAX ) 17 g packet Take 17 g by mouth once. Patient not taking: Reported on 04/13/2024    [provider]  rosuvastatin  (CRESTOR ) 20 MG tablet TAKE 1 TABLET BY MOUTH EVERY DAY 06/02/24   Lelon Hamilton T, PA-C  senna (SENOKOT) 8.6 MG TABS tablet Take 1 tablet by mouth daily. Patient not taking: Reported on 04/13/2024    [provider]  tamsulosin  (FLOMAX ) 0.4 MG CAPS capsule TAKE 1 CAPSULE BY MOUTH EVERYDAY AT BEDTIME 10/01/23   Madelon Donald HERO, DO    Physical Exam: Vitals:   06/05/24 2015 06/05/24 2030 06/05/24 2145 06/05/24 2157  BP: (!) 182/86 (!) 194/111 (!) 179/114   Pulse: 70 70 70   Resp:  20 20   Temp:    (!) 97.5 F (36.4 C)  TempSrc:    Axillary  SpO2: 100% 99% 100%    Physical Exam Constitutional:      Appearance: He is normal weight.  HENT:     Head: Normocephalic.     Nose: Nose normal.     Mouth/Throat:     Mouth: Mucous membranes are moist.     Pharynx: Oropharynx is clear.  Eyes:     Conjunctiva/sclera: Conjunctivae normal.     Pupils: Pupils are equal, round, and reactive to light.  Cardiovascular:     Rate and Rhythm: Normal rate and regular rhythm.     Pulses: Normal pulses.     Heart sounds: Normal heart sounds.  Pulmonary:     Effort: Pulmonary effort is normal.     Breath sounds: Normal breath sounds.  Abdominal:     General: Abdomen is flat. Bowel sounds are normal.  Musculoskeletal:        General: Normal range of motion.     Cervical back: Normal range of motion.  Skin:    General: Skin is warm.     Capillary Refill: Capillary refill takes less than 2 seconds.  Neurological:     General: No focal deficit present.     Mental Status: He is alert.  Psychiatric:        Mood and Affect:  Mood normal.        Labs on Admission: I have personally reviewed the patients's labs and imaging studies.  Assessment/Plan Principal Problem:   Sepsis (HCC)  # Sepsis, unclear etiology # Acute metabolic  encephalopathy - Patient found to have hyperglycemia with multiple electrolyte derangements - Patient is not in DKA - Unclear etiology of infection  Plan: CT chest abdomen pelvis Continue IV fluids Start vancomycin  and cefepime  Insulin  with every 4 hours sliding scale  # Hypertension-continue carvedilol , losartan , hydrochlorothiazide   # Depression-continue Paxil   # Hyperlipidemia-continue Crestor   # BPH-continue Flomax   # Constipation-continue Linzess   # A-fib-continue Eliquis , Coreg    Admission status: Inpatient   Certification: The appropriate patient status for this patient is INPATIENT. Inpatient status is judged to be reasonable and necessary in order to provide the required intensity of service to ensure the patient's safety. The patient's presenting symptoms, physical exam findings, and initial radiographic and laboratory data in the context of their chronic comorbidities is felt to place them at high risk for further clinical deterioration. Furthermore, it is not anticipated that the patient will be medically stable for discharge from the hospital within 2 midnights of admission.   * I certify that at the point of admission it is my clinical judgment that the patient will require inpatient hospital care spanning beyond 2 midnights from the point of admission due to high intensity of service, high risk for further deterioration and high frequency of surveillance required.DEWAINE Lamar Dess MD [facility] If 7PM-7AM, please contact night-coverage [email or web address]  06/05/2024, 10:23 PM        [1]  Allergies Allergen Reactions   Enalapril Maleate Cough   "

## 2024-06-05 NOTE — Progress Notes (Signed)
 Pharmacy Antibiotic Note  Anthony [name] is a 74 y.o. male admitted on 06/05/2024 with AMS.  Pharmacy has been consulted for vancomycin  dosing.  Plan: Vancomycin  1750mg  IV x 1 then 1500mg  q36h (AUC 519.5,Scr 2.54) Follow renal function and clinical course  Weight: 94.3 kg (207 lb 14.3 oz)  Temp (24hrs), Avg:97.7 F (36.5 C), Min:97.3 F (36.3 C), Max:98.1 F (36.7 C)  Recent Labs  Lab 06/05/24 1601 06/05/24 1715 06/05/24 1930 06/05/24 1934 06/05/24 2226  WBC 6.8  --   --   --   --   CREATININE  --  2.58* 2.54*  --   --   LATICACIDVEN  --   --   --  4.5* 1.9    Estimated Creatinine Clearance: 31.3 mL/min (A) (by C-G formula based on SCr of 2.54 mg/dL (H)).    Allergies[1]  Antimicrobials this admission: 1/11 vanc >> 1/11 cefepime  >>  Dose adjustments this admission:   Microbiology results:  Thank you for allowing pharmacy to be a part of this patients care.  Leeroy Mace RPh 06/05/2024, 10:44 PM     [1]  Allergies Allergen Reactions   Enalapril Maleate Cough

## 2024-06-05 NOTE — ED Provider Notes (Signed)
 " [name] EMERGENCY DEPARTMENT AT [facility] Provider Note   CSN: [id number] Arrival date & time: 06/05/24  1257     Patient presents with: Dysuria   Anthony [name] is a 74 y.o. male.  {Add pertinent medical, surgical, social history, OB history to YEP:[id number]}  Dysuria Presenting symptoms: dysuria      74 year old male with medical history significant for diabetes mellitus, HTN, HLD, GERD, CHF status post ICD, CAD, CKD, chronic atrial fibrillation, CVA, DM 2 presenting to the emergency department with altered mental status.  The history is provided by EMS who states that the patient had been complaining of burning with urination and painful urination for the past week.  Has been confused with generalized weakness, is coming from home.  Significant other had called EMS given the patient's confusion.  The patient arrives GCS 14, AAO x 1.    Spoke with Anthony [name], his spouse, stating that over the past two weeks he has had decreased PO intake, not eating or drinking much, not checking his home blood sugars. Has been more confused. Has been losing weight. Probably confused for the past week. Sometimes trouble getting words out, having difficulty understanding him, no facial droop, numbness or weakness. Not sure whether at baseline he will know the year.  Prior to Admission medications  Medication Sig Start Date End Date Taking? Authorizing Provider  acetaminophen  (TYLENOL ) 650 MG CR tablet Take 1,300 mg by mouth 2 (two) times daily as needed for pain.    [provider]  apixaban  (ELIQUIS ) 5 MG TABS tablet Take 1 tablet (5 mg total) by mouth 2 (two) times daily. 04/13/24   McDiarmid, Krystal BIRCH, MD  baclofen (LIORESAL) 10 MG tablet Take 10 mg by mouth every 6 (six) hours as needed for muscle spasms. Patient not taking: Reported on 04/13/2024    [provider]  BD PEN NEEDLE NANO 2ND GEN 32G X 4 MM MISC USE AS DIRECTED 03/04/24   Madelon Donald HERO, DO  Blood Glucose  Monitoring Suppl (BLOOD GLUCOSE MONITOR SYSTEM) w/Device KIT Use 3 (three) times daily. 02/09/24   Tharon Lung, MD  calcitRIOL  (ROCALTROL ) 0.5 MCG capsule Take 0.5 mcg by mouth daily. Patient not taking: Reported on 04/16/2024 09/02/22   [provider]  carvedilol  (COREG ) 25 MG tablet TAKE 1 TABLET BY MOUTH TWICE A DAY 05/10/24   Rumball, Alison M, DO  Continuous Glucose Sensor (FREESTYLE LIBRE 3 PLUS SENSOR) MISC Change sensor every 15 days. 02/16/24   Rumball, Alison M, DO  dorzolamide -timolol  (COSOPT ) 2-0.5 % ophthalmic solution Place 1 drop into both eyes 2 (two) times daily. 12/16/23   [provider]  feeding supplement (ENSURE PLUS HIGH PROTEIN) LIQD Take 237 mLs by mouth 2 (two) times daily between meals. Patient not taking: Reported on 04/16/2024 01/30/24   Tharon Lung, MD  furosemide  (LASIX ) 20 MG tablet Take 3 tablets (60 mg total) by mouth daily. 11/06/23   Rumball, Alison M, DO  gabapentin  (NEURONTIN ) 100 MG capsule Take 2 capsules (200 mg total) by mouth every 8 (eight) hours. 01/30/24   Tharon Lung, MD  Glucose Blood (BLOOD GLUCOSE TEST STRIPS) STRP Use 3 (three) times daily. Use as directed to check blood sugar. 02/09/24   Tharon Lung, MD  insulin  aspart (NOVOLOG ) 100 UNIT/ML FlexPen Inject 6-10 Units into the skin 2 (two) times daily before a meal. Inject 6 units at lunch time and 10 units at dinner. 04/13/24   McDiarmid, Krystal BIRCH, MD  insulin  degludec (TRESIBA   FLEXTOUCH) 100 UNIT/ML FlexTouch Pen Inject 28 Units into the skin daily. 04/13/24   McDiarmid, Krystal BIRCH, MD  Lancet Device MISC Use 3 (three) times daily. May dispense any manufacturer covered by patient's insurance. 02/09/24   Tharon Lung, MD  Lancets [facility] DELICA PLUS [place]) MISC Use 1 each 3 (three) times daily. Use as directed to check blood sugar. 02/09/24   Tharon Lung, MD  latanoprost  (XALATAN ) 0.005 % ophthalmic solution Place 1 drop into both eyes at bedtime. 09/03/22   [provider]   lidocaine  (LIDODERM ) 5 % Place 1 patch onto the skin daily. Remove & Discard patch within 12 hours or as directed by MD Patient not taking: Reported on 04/13/2024 01/30/24   Tharon Lung, MD  linaclotide  (LINZESS ) 145 MCG CAPS capsule Take 145 mcg by mouth daily before breakfast.    [provider]  loratadine  (CLARITIN ) 10 MG tablet Take 10 mg by mouth daily.    [provider]  losartan -hydrochlorothiazide  (HYZAAR) 50-12.5 MG tablet TAKE 1 TABLET BY MOUTH EVERY DAY 09/15/23   Rumball, Alison M, DO  Multiple Vitamin (MULTIVITAMIN) tablet Take 1 tablet by mouth daily.      [provider]  nitroGLYCERIN  (NITROSTAT ) 0.4 MG SL tablet Place 1 tablet (0.4 mg total) under the tongue every 5 (five) minutes as needed for chest pain. If no relief by 3rd tab, call 911 Patient not taking: Reported on 04/16/2024 10/04/22   McDiarmid, Krystal BIRCH, MD  omeprazole  (PRILOSEC) 20 MG capsule Take 20 mg by mouth daily.    [provider]  Oxycodone  HCl 10 MG TABS Take 10 mg by mouth in the morning, at noon, and at bedtime.    [provider]  PARoxetine  (PAXIL ) 30 MG tablet TAKE 1 TABLET BY MOUTH EVERY DAY 06/25/23   Rumball, Alison M, DO  polyethylene glycol (MIRALAX  / GLYCOLAX ) 17 g packet Take 17 g by mouth once. Patient not taking: Reported on 04/13/2024    [provider]  rosuvastatin  (CRESTOR ) 20 MG tablet TAKE 1 TABLET BY MOUTH EVERY DAY 06/02/24   Lelon Hamilton T, PA-C  senna (SENOKOT) 8.6 MG TABS tablet Take 1 tablet by mouth daily. Patient not taking: Reported on 04/13/2024    [provider]  tamsulosin  (FLOMAX ) 0.4 MG CAPS capsule TAKE 1 CAPSULE BY MOUTH EVERYDAY AT BEDTIME 10/01/23   Rumball, Alison M, DO    Allergies: Enalapril maleate    Review of Systems  Unable to perform ROS: Mental status change  Genitourinary:  Positive for dysuria.    Updated Vital Signs BP (!) 164/78   Pulse 83   Temp 98.1 F (36.7 C) (Oral)   Resp 18   SpO2 (!)  69%   Physical Exam Vitals and nursing note reviewed.  Constitutional:      General: He is not in acute distress.    Appearance: He is well-developed.     Comments: GCS 14, AAOx1  HENT:     Head: Normocephalic and atraumatic.  Eyes:     Conjunctiva/sclera: Conjunctivae normal.  Cardiovascular:     Rate and Rhythm: Normal rate and regular rhythm.     Heart sounds: No murmur heard. Pulmonary:     Effort: Pulmonary effort is normal. No respiratory distress.     Breath sounds: Normal breath sounds.  Abdominal:     Palpations: Abdomen is soft.     Tenderness: There is no abdominal tenderness.  Musculoskeletal:        General: No swelling.  Cervical back: Neck supple.  Skin:    General: Skin is warm and dry.     Capillary Refill: Capillary refill takes less than 2 seconds.  Neurological:     Mental Status: He is alert.     Comments: MENTAL STATUS EXAM:    Orientation: Alert and oriented to person. DISORIENTED TO PLACE AND TIME. Memory: Cooperative, follows commands well.  Language: Speech is clear and language is normal.   CRANIAL NERVES:    CN 2 (Optic): Visual fields intact to confrontation.  CN 3,4,6 (EOM): Pupils equal and reactive to light. Full extraocular eye movement without nystagmus.  CN 5 (Trigeminal): Facial sensation is normal, no weakness of masticatory muscles.  CN 7 (Facial): No facial weakness or asymmetry.  CN 8 (Auditory): Auditory acuity grossly normal.  CN 9,10 (Glossophar): The uvula is midline, the palate elevates symmetrically.  CN 11 (spinal access): Normal sternocleidomastoid and trapezius strength.  CN 12 (Hypoglossal): The tongue is midline. No atrophy or fasciculations.SABRA   MOTOR:  Muscle Strength: 5/5RUE, 5/5LUE, 5/5RLE, 5/5LLE.   COORDINATION:  No tremor  SENSATION:   Intact to light touch all four extremities.  GAIT: Gait not assessed   Psychiatric:        Mood and Affect: Mood normal.     (all labs ordered are listed, but only  abnormal results are displayed) Labs Reviewed  URINALYSIS, ROUTINE W REFLEX MICROSCOPIC - Abnormal; Notable for the following components:      Result Value   Glucose, UA >=500 (*)    Hgb urine dipstick MODERATE (*)    Ketones, ur 20 (*)    Protein, ur >=300 (*)    All other components within normal limits  CBC WITH DIFFERENTIAL/PLATELET - Abnormal; Notable for the following components:   Platelets 41 (*)    All other components within normal limits  COMPREHENSIVE METABOLIC PANEL WITH GFR - Abnormal; Notable for the following components:   CO2 19 (*)    Glucose, Bld 472 (*)    BUN 45 (*)    Creatinine, Ser 2.58 (*)    Alkaline Phosphatase 143 (*)    Total Bilirubin 1.8 (*)    GFR, Estimated 26 (*)    Anion gap 24 (*)    All other components within normal limits  CBG MONITORING, ED - Abnormal; Notable for the following components:   Glucose-Capillary 415 (*)    All other components within normal limits  TROPONIN T, HIGH SENSITIVITY - Abnormal; Notable for the following components:   Troponin T High Sensitivity 77 (*)    All other components within normal limits  AMMONIA  MAGNESIUM   BETA-HYDROXYBUTYRIC ACID  I-STAT VENOUS BLOOD GAS, ED  I-STAT CG4 LACTIC ACID, ED    EKG: EKG Interpretation Date/Time:  Saturday June 05 2024 14:20:25 EST Ventricular Rate:  70 PR Interval:  198 QRS Duration:  149 QT Interval:  508 QTC Calculation: 549 R Axis:   -68  Text Interpretation: Sinus or ectopic atrial rhythm Right bundle branch block LVH by voltage Prolonged QT interval Confirmed by Jerrol Agent ([id number]) on 06/05/2024 3:12:32 PM  Radiology: CT HEAD WO CONTRAST Result Date: 06/05/2024 CLINICAL DATA:  Altered mental status. EXAM: CT HEAD WITHOUT CONTRAST TECHNIQUE: Contiguous axial images were obtained from the base of the skull through the vertex without intravenous contrast. RADIATION DOSE REDUCTION: This exam was performed according to the departmental dose-optimization program  which includes automated exposure control, adjustment of the mA and/or kV according to patient size and/or use of  iterative reconstruction technique. COMPARISON:  02/06/2024 FINDINGS: Brain: No evidence of intracranial hemorrhage, acute infarction, hydrocephalus, extra-axial collection, or mass lesion/mass effect. Old left frontal lobe infarct again seen. Moderate diffuse cerebral atrophy and severe chronic small vessel disease are unchanged in appearance. Vascular:  No hyperdense vessel or other acute findings. Skull: No evidence of fracture or other significant bone abnormality. Sinuses/Orbits: No acute findings. Diffuse right maxillary sinus mucosal thickening again seen. Other: None. IMPRESSION: No acute intracranial abnormality. Old left frontal lobe infarct. Stable cerebral atrophy and chronic small vessel disease. Electronically Signed   By: Norleen DELENA Kil M.D.   On: 06/05/2024 13:49   DG Chest Port 1 View Result Date: 06/05/2024 CLINICAL DATA:  Altered mental status, confusion and weakness. EXAM: PORTABLE CHEST 1 VIEW COMPARISON:  01/26/2024 and CT chest 02/06/2024. FINDINGS: Trachea is midline. Pacemaker and ICD lead tips are stable in position. Heart is enlarged. Thoracic aorta is calcified. Lungs are clear. No pleural fluid. IMPRESSION: No acute findings. Electronically Signed   By: Newell Eke M.D.   On: 06/05/2024 13:47    {Document cardiac monitor, telemetry assessment procedure when appropriate:32947} .Critical Care  Performed by: Jerrol Agent, MD Authorized by: Jerrol Agent, MD   Critical care provider statement:    Critical care time (minutes):  80   Critical care was necessary to treat or prevent imminent or life-threatening deterioration of the following conditions:  Endocrine crisis   Critical care was time spent personally by me on the following activities:  Development of treatment plan with patient or surrogate, discussions with consultants, evaluation of patient's response  to treatment, examination of patient, ordering and review of laboratory studies, ordering and review of radiographic studies, ordering and performing treatments and interventions, pulse oximetry, re-evaluation of patient's condition and review of old charts   Care discussed with: admitting provider      Medications Ordered in the ED  haloperidol  lactate (HALDOL ) injection 5 mg (5 mg Intramuscular Given 06/05/24 1603)  LORazepam  (ATIVAN ) injection 1 mg (1 mg Intravenous Given 06/05/24 1826)  lactated ringers  bolus 1,000 mL (1,000 mLs Intravenous New Bag/Given 06/05/24 1830)    Clinical Course as of 06/05/24 2230  Sat Jun 05, 2024  1812 CO2(!): 19 [JL]  1813 BUN(!): 45 [JL]  1813 Creatinine(!): 2.58 [JL]  1813 Anion gap(!): 24 [JL]  1813 Glucose-Capillary(!): 415 [JL]  2043 CO2(!): 13 [JL]  2043 Anion gap(!): 26 [JL]  2043 Glucose(!): 416 [JL]  2116 Beta-Hydroxybutyric Acid(!): 4.66 [JL]    Clinical Course User Index [JL] Jerrol Agent, MD   {Click here for ABCD2, HEART and other calculators REFRESH Note before signing:1}                              Medical Decision Making Amount and/or Complexity of Data Reviewed Labs: ordered. Decision-making details documented in ED Course. Radiology: ordered.  Risk Prescription drug management. Decision regarding hospitalization.     74 year old male with medical history significant for diabetes mellitus, HTN, HLD, GERD, CHF status post ICD, CAD, CKD, chronic atrial fibrillation, CVA, DM 2 presenting to the emergency department with altered mental status.  The history is provided by EMS who states that the patient had been complaining of burning with urination and painful urination for the past week.  Has been confused with generalized weakness, is coming from home.  Significant other had called EMS given the patient's confusion.  The patient arrives GCS 14, AAO x 1.  Spoke with Anthony [name], his spouse, stating that over the past two weeks he  has had decreased PO intake, not eating or drinking much, not checking his home blood sugars. Has been more confused. Has been losing weight. Probably confused for the past week. Sometimes trouble getting words out, having difficulty understanding him, no facial droop, numbness or weakness. Not sure whether at baseline he will know the year.  Medical Decision Making:   Anthony [name] is a 74 y.o. male who presented to the ED today with altered mental status detailed above.     Complete initial physical exam performed, notably the patient  was AAOx1, GCS 14, otherwise neuro intact.    Reviewed and confirmed nursing documentation for past medical history, family history, social history.    Initial Assessment:   With the patient's presentation of altered mental status, most likely diagnosis is delerium 2/2 infectious etiology (UTI/CAP/URI) vs metabolic abnormality (Na/K/Mg/Ca) vs nonspecific etiology. Other diagnoses were considered including (but not limited to) CVA, ICH, intracranial mass, critical dehydration, heptatic dysfunction, uremia, hypercarbia, intoxication, endrocrine abnormality, toxidrome. These are considered less likely due to history of present illness and physical exam findings.   This is most consistent with an acute life/limb threatening illness complicated by underlying chronic conditions.  Initial Plan:  CTH to evaluate for intracranial etiology of patient's symptoms  Screening labs including CBC and Metabolic panel to evaluate for infectious or metabolic etiology of disease.  Urinalysis with reflex culture ordered to evaluate for UTI or relevant urologic/nephrologic pathology.  CXR to evaluate for structural/infectious intrathoracic pathology.  TSH for evaluation for endrocrine etiology Drug screen for toxidrome evaluation VBG for acid/base status and further toxidrome evaulation EKG to evaluate for cardiac pathology Objective evaluation as below reviewed   Initial Study  Results:   Laboratory  All laboratory results reviewed without evidence of clinically relevant pathology.   Exceptions include: Anion gap acidosis with a bicarbonate of 19, blood  EKG EKG was reviewed independently. Rate, rhythm, axis, intervals all examined and without medically relevant abnormality. ST segments without concerns for elevations.    Radiology:  All images reviewed independently. ***Agree with radiology report at this time.   CT HEAD WO CONTRAST Result Date: 06/05/2024 CLINICAL DATA:  Altered mental status. EXAM: CT HEAD WITHOUT CONTRAST TECHNIQUE: Contiguous axial images were obtained from the base of the skull through the vertex without intravenous contrast. RADIATION DOSE REDUCTION: This exam was performed according to the departmental dose-optimization program which includes automated exposure control, adjustment of the mA and/or kV according to patient size and/or use of iterative reconstruction technique. COMPARISON:  02/06/2024 FINDINGS: Brain: No evidence of intracranial hemorrhage, acute infarction, hydrocephalus, extra-axial collection, or mass lesion/mass effect. Old left frontal lobe infarct again seen. Moderate diffuse cerebral atrophy and severe chronic small vessel disease are unchanged in appearance. Vascular:  No hyperdense vessel or other acute findings. Skull: No evidence of fracture or other significant bone abnormality. Sinuses/Orbits: No acute findings. Diffuse right maxillary sinus mucosal thickening again seen. Other: None. IMPRESSION: No acute intracranial abnormality. Old left frontal lobe infarct. Stable cerebral atrophy and chronic small vessel disease. Electronically Signed   By: Norleen DELENA Kil M.D.   On: 06/05/2024 13:49   DG Chest Port 1 View Result Date: 06/05/2024 CLINICAL DATA:  Altered mental status, confusion and weakness. EXAM: PORTABLE CHEST 1 VIEW COMPARISON:  01/26/2024 and CT chest 02/06/2024. FINDINGS: Trachea is midline. Pacemaker and ICD lead  tips are stable in position. Heart is enlarged. Thoracic aorta is  calcified. Lungs are clear. No pleural fluid. IMPRESSION: No acute findings. Electronically Signed   By: Newell Eke M.D.   On: 06/05/2024 13:47   CUP PACEART REMOTE DEVICE CHECK Result Date: 06/01/2024 ICD Scheduled remote reviewed. Normal device function.  Presenting rhythm:  BiV pace with PVC's HL=12 Next remote transmission per protocol. LA, CVRS     Consults: Case discussed with ***.   Final Assessment and Plan:   ***    {Document critical care time when appropriate  Document review of labs and clinical decision tools ie CHADS2VASC2, etc  Document your independent review of radiology images and any outside records  Document your discussion with family members, caretakers and with consultants  Document social determinants of health affecting pt's care  Document your decision making why or why not admission, treatments were needed:32947:::1}   Final diagnoses:  Altered mental status, unspecified altered mental status type  Increased anion gap metabolic acidosis  Hyperglycemia    ED Discharge Orders     None        "

## 2024-06-06 ENCOUNTER — Other Ambulatory Visit: Payer: Self-pay

## 2024-06-06 DIAGNOSIS — J189 Pneumonia, unspecified organism: Secondary | ICD-10-CM

## 2024-06-06 DIAGNOSIS — E162 Hypoglycemia, unspecified: Secondary | ICD-10-CM | POA: Diagnosis not present

## 2024-06-06 LAB — GLUCOSE, CAPILLARY
Glucose-Capillary: 123 mg/dL — ABNORMAL HIGH (ref 70–99)
Glucose-Capillary: 172 mg/dL — ABNORMAL HIGH (ref 70–99)
Glucose-Capillary: 181 mg/dL — ABNORMAL HIGH (ref 70–99)
Glucose-Capillary: 84 mg/dL (ref 70–99)

## 2024-06-06 LAB — CBG MONITORING, ED
Glucose-Capillary: 48 mg/dL — ABNORMAL LOW (ref 70–99)
Glucose-Capillary: 182 mg/dL — ABNORMAL HIGH (ref 70–99)
Glucose-Capillary: 169 mg/dL — ABNORMAL HIGH (ref 70–99)

## 2024-06-06 LAB — BETA-HYDROXYBUTYRIC ACID: Beta-Hydroxybutyric Acid: 4.5 mmol/L — ABNORMAL HIGH (ref 0.05–0.27)

## 2024-06-06 MED ORDER — DEXTROSE IN LACTATED RINGERS 5 % IV SOLN: INTRAVENOUS | Status: AC

## 2024-06-06 MED ORDER — SODIUM CHLORIDE 0.9 % IV SOLN
2.0000 g | Freq: Two times a day (BID) | INTRAVENOUS | Status: DC
  Administered 2024-06-06 – 2024-06-07 (×3): 2 g via INTRAVENOUS
  Filled 2024-06-06 (×3): qty 12.5

## 2024-06-06 MED ORDER — LINACLOTIDE 145 MCG PO CAPS: 145.0000 ug | ORAL_CAPSULE | Freq: Every day | ORAL | Status: DC | PRN

## 2024-06-06 MED ORDER — DEXTROSE 50 % IV SOLN
1.0000 | Freq: Once | INTRAVENOUS | Status: AC
  Administered 2024-06-06: 50 mL via INTRAVENOUS

## 2024-06-06 MED ORDER — DEXTROSE 50 % IV SOLN
INTRAVENOUS | Status: AC
  Filled 2024-06-06: qty 50

## 2024-06-06 MED ORDER — HYDRALAZINE HCL 25 MG PO TABS
25.0000 mg | ORAL_TABLET | Freq: Four times a day (QID) | ORAL | Status: DC | PRN
  Administered 2024-06-10: 25 mg via ORAL
  Filled 2024-06-06: qty 1

## 2024-06-06 MED ORDER — VANCOMYCIN HCL 1500 MG/300ML IV SOLN
1500.0000 mg | INTRAVENOUS | Status: DC
  Filled 2024-06-06: qty 300

## 2024-06-06 NOTE — Progress Notes (Signed)
 " PROGRESS NOTE    Anthony [name]  FMW:[id number] DOB: Jun 18, 1950 DOA: 06/05/2024 PCP: Madelon Donald HERO, DO  Outpatient Specialists:     Brief Narrative:  Patient is a 74 year old male past medical history significant for atrial fibrillation, CHF, depression, hypertension, hyperlipidemia and CVA.  Patient was admitted with altered mental status.  Patient is currently being managed for sepsis/likely pneumonia.  06/06/2024: Patient seen.  Patient is a poor historian.  Patient is currently on IV vancomycin  and cefepime .   Assessment & Plan:   Principal Problem:   Sepsis (HCC)   Sepsis/possible pneumonia: Acute metabolic encephalopathy: -CT chest is said to reveal possible pneumonia. - Follow cultures. - Continue antibiotics (IV vancomycin  and cefepime ). - Manage expectantly.  Hyperglycemia: - Blood sugar of 387 on presentation. - Blood sugar control has improved significantly. - Continue to monitor blood sugar closely.    Hypertensive urgency:  - Continue carvedilol , losartan , hydrochlorothiazide    Hyperlipidemia: - Continue Crestor .  BPH: - Continue Flomax .   Constipation: -continue Linzess    A-fib: -continue Eliquis , Coreg    DVT prophylaxis: Eliquis  Code Status: Full code Family Communication:  Disposition Plan:    Consultants:  None.  Procedures:  None.  Antimicrobials:  IV vancomycin  IV cefepime    Subjective: -Poor historian.  Objective: Vitals:   06/06/24 1011 06/06/24 1030 06/06/24 1130 06/06/24 1209  BP:  (!) 183/113 (!) 151/97 104/78  Pulse:  70 70 69  Resp:  (!) 24 (!) 21 18  Temp: (!) 97.5 F (36.4 C)   97.7 F (36.5 C)  TempSrc: Oral   Oral  SpO2:  96% 98% 97%  Weight:        Intake/Output Summary (Last 24 hours) at 06/06/2024 1431 Last data filed at 06/06/2024 0427 Gross per 24 hour  Intake 2842.64 ml  Output --  Net 2842.64 ml   Filed Weights   06/05/24 2200  Weight: 94.3 kg    Examination:  General exam:  Chronically ill looking.  In any distress.  Awake and alert.    Respiratory system: Clear to auscultation. Respiratory effort normal. Cardiovascular system: S1 & S2  Gastrointestinal system: Abdomen is soft and nontender.  Central nervous system: Awake and alert.     Data Reviewed: I have personally reviewed following labs and imaging studies  CBC: Recent Labs  Lab 06/05/24 1601  WBC 6.8  NEUTROABS 5.1  HGB 16.5  HCT 50.4  MCV 89.8  PLT 41*   Basic Metabolic Panel: Recent Labs  Lab 06/05/24 1715 06/05/24 1930 06/05/24 2220  NA 141 141 140  K 3.6 3.8 3.9  CL 99 102 103  CO2 19* 13* 19*  GLUCOSE 472* 416* 414*  BUN 45* 48* 48*  CREATININE 2.58* 2.54* 2.51*  CALCIUM  9.7 9.3 9.4  MG 2.0  --   --    GFR: Estimated Creatinine Clearance: 31.7 mL/min (A) (by C-G formula based on SCr of 2.51 mg/dL (H)). Liver Function Tests: Recent Labs  Lab 06/05/24 1715  AST 27  ALT 23  ALKPHOS 143*  BILITOT 1.8*  PROT 6.6  ALBUMIN 3.7   No results for input(s): LIPASE, AMYLASE in the last 168 hours. Recent Labs  Lab 06/05/24 1715  AMMONIA 32   Coagulation Profile: No results for input(s): INR, PROTIME in the last 168 hours. Cardiac Enzymes: No results for input(s): CKTOTAL, CKMB, CKMBINDEX, TROPONINI in the last 168 hours. BNP (last 3 results) No results for input(s): PROBNP in the last 8760 hours. HbA1C: No results for input(s): HGBA1C  in the last 72 hours. CBG: Recent Labs  Lab 06/05/24 2340 06/06/24 0422 06/06/24 0449 06/06/24 0745 06/06/24 1224  GLUCAP 292* 48* 182* 169* 123*   Lipid Profile: No results for input(s): CHOL, HDL, LDLCALC, TRIG, CHOLHDL, LDLDIRECT in the last 72 hours. Thyroid  Function Tests: No results for input(s): TSH, T4TOTAL, FREET4, T3FREE, THYROIDAB in the last 72 hours. Anemia Panel: No results for input(s): VITAMINB12, FOLATE, FERRITIN, TIBC, IRON, RETICCTPCT in the last 72  hours. Urine analysis:    Component Value Date/Time   COLORURINE YELLOW 06/05/2024 1533   APPEARANCEUR CLEAR 06/05/2024 1533   LABSPEC 1.014 06/05/2024 1533   PHURINE 5.0 06/05/2024 1533   GLUCOSEU >=500 (A) 06/05/2024 1533   HGBUR MODERATE (A) 06/05/2024 1533   HGBUR moderate 09/07/2009 1003   BILIRUBINUR NEGATIVE 06/05/2024 1533   KETONESUR 20 (A) 06/05/2024 1533   PROTEINUR >=300 (A) 06/05/2024 1533   UROBILINOGEN 0.2 09/07/2009 1003   NITRITE NEGATIVE 06/05/2024 1533   LEUKOCYTESUR NEGATIVE 06/05/2024 1533   Sepsis Labs: @LABRCNTIP (procalcitonin:4,lacticidven:4)  )No results found for this or any previous visit (from the past 240 hours).       Radiology Studies: CT CHEST ABDOMEN PELVIS WO CONTRAST Result Date: 06/06/2024 EXAM: CT CHEST, ABDOMEN AND PELVIS WITHOUT CONTRAST 06/06/2024 12:24:19 AM TECHNIQUE: CT of the chest, abdomen and pelvis was performed without the administration of intravenous contrast. Multiplanar reformatted images are provided for review. Automated exposure control, iterative reconstruction, and/or weight based adjustment of the mA/kV was utilized to reduce the radiation dose to as low as reasonably achievable. COMPARISON: None available. CLINICAL HISTORY: sepsis FINDINGS: CHEST: MEDIASTINUM AND LYMPH NODES: Enlarged heart. pericardium are unremarkable. 4-vessel coronary artery calcifications. The central airways are clear. No mediastinal, hilar or axillary lymphadenopathy. Possible trace pyuria appears associated with fluid within the esophageal lumen. No esophageal wall thickening. The thyroid  gland is unremarkable. LUNGS AND PLEURA: Patchy ground-glass opacities of the right lower lobe (4.3). No pleural effusion. No pneumothorax. ABDOMEN AND PELVIS: LIVER: Unremarkable. GALLBLADDER AND BILE DUCTS: Unremarkable. No biliary ductal dilatation. SPLEEN: No acute abnormality. PANCREAS: Diffusely atrophic pancreas. No focal lesion. Otherwise normal pancreatic  contour. No surrounding inflammatory changes. No main pancreatic ductal dilatation. ADRENAL GLANDS: No acute abnormality. KIDNEYS, URETERS AND BLADDER: Fluid density of the kidneys likely represent simple renal cysts. Simple renal cysts do not require additional follow-up unless clinically indicated due to signs/symptoms. Bilateral renal cortical scarring. Nonspecific bilateral perinephric fat stranding. No stones in the kidneys or ureters. No hydronephrosis. Urinary bladder is unremarkable. GI AND BOWEL: Stomach demonstrates no acute abnormality. No small or large bowel thickening or dilatation. The appendix is unremarkable. There is no bowel obstruction. REPRODUCTIVE ORGANS: No acute abnormality. PERITONEUM AND RETROPERITONEUM: No ascites. No free air. VASCULATURE: Moderate atherosclerotic plaque of the aorta. Severe atherosclerotic plaque of the abdominal aorta. ABDOMINAL AND PELVIS LYMPH NODES: No lymphadenopathy. REPRODUCTIVE ORGANS: No acute abnormality. 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 Grade 2 anterolisthesis of L5 on S1 with impaction of the L5 vertebral body onto the S1 superior endplate, with complete loss of the intervertebral disc space. Left chest wall dual lead cardiac defibrillator. Small fat-containing umbilical hernia. IMPRESSION: 1. Patchy ground-glass opacities in the right lower lobe, which may reflect an infectious or inflammatory process. 2. Severe atherosclerotic plaque of the abdominal aorta including 4-vessel coronary artery calcifications. 3. Other, non-acute and/or normal findings as above. Electronically signed by: Morgane Naveau MD MD 06/06/2024 12:36 AM EST RP Workstation: [id number]  CT HEAD WO CONTRAST Result Date: 06/05/2024 CLINICAL DATA:  Altered mental status. EXAM: CT HEAD WITHOUT CONTRAST TECHNIQUE: Contiguous axial images were obtained from the base of the skull through the vertex without intravenous contrast.  RADIATION DOSE REDUCTION: This exam was performed according to the departmental dose-optimization program which includes automated exposure control, adjustment of the mA and/or kV according to patient size and/or use of iterative reconstruction technique. COMPARISON:  02/06/2024 FINDINGS: Brain: No evidence of intracranial hemorrhage, acute infarction, hydrocephalus, extra-axial collection, or mass lesion/mass effect. Old left frontal lobe infarct again seen. Moderate diffuse cerebral atrophy and severe chronic small vessel disease are unchanged in appearance. Vascular:  No hyperdense vessel or other acute findings. Skull: No evidence of fracture or other significant bone abnormality. Sinuses/Orbits: No acute findings. Diffuse right maxillary sinus mucosal thickening again seen. Other: None. IMPRESSION: No acute intracranial abnormality. Old left frontal lobe infarct. Stable cerebral atrophy and chronic small vessel disease. Electronically Signed   By: Norleen DELENA Kil M.D.   On: 06/05/2024 13:49   DG Chest Port 1 View Result Date: 06/05/2024 CLINICAL DATA:  Altered mental status, confusion and weakness. EXAM: PORTABLE CHEST 1 VIEW COMPARISON:  01/26/2024 and CT chest 02/06/2024. FINDINGS: Trachea is midline. Pacemaker and ICD lead tips are stable in position. Heart is enlarged. Thoracic aorta is calcified. Lungs are clear. No pleural fluid. IMPRESSION: No acute findings. Electronically Signed   By: Newell Eke M.D.   On: 06/05/2024 13:47        Scheduled Meds:  apixaban   5 mg Oral BID   carvedilol   25 mg Oral BID   dorzolamide -timolol   1 drop Both Eyes BID   gabapentin   300 mg Oral Q8H   hydrochlorothiazide   12.5 mg Oral Daily   insulin  aspart  0-24 Units Subcutaneous Q4H   insulin  glargine-yfgn  20 Units Subcutaneous Daily   latanoprost   1 drop Both Eyes QHS   linaclotide   145 mcg Oral QAC breakfast   losartan   50 mg Oral Daily   pantoprazole   40 mg Oral Daily   PARoxetine   30 mg Oral Daily    rosuvastatin   20 mg Oral Daily   tamsulosin   0.4 mg Oral Daily   Continuous Infusions:  ceFEPime  (MAXIPIME ) IV 2 g (06/06/24 1326)   dextrose  5% lactated ringers  75 mL/hr at 06/06/24 0439   [START ON 06/07/2024] vancomycin      vancomycin  Stopped (06/06/24 0403)     LOS: 1 day    Time spent: 55 minutes.    Leatrice Chapel, MD  [facility] 7PM-7AM contact night coverage as above    "

## 2024-06-06 NOTE — Progress Notes (Signed)
 Patient arrived to the floor with mittens and a waist belt restraint. Patient oriented and was advised on how the restraints could be removed. Patient acknowledged that he understood fully. All restraints removed and Patient provided with new gown and transferred to the rest room and later to the recliner where he remains calm and very cooperative. Lunch ordered and patient drink water. Will closely monitor to make sure patient is safe.

## 2024-06-06 NOTE — Progress Notes (Unsigned)
" ° °  SUBJECTIVE:   CHIEF COMPLAINT / HPI:   Discussed the use of AI scribe software for clinical note transcription with the patient, who gave verbal consent to proceed.  History of Present Illness     BP f/u  OBJECTIVE:   There were no vitals taken for this visit.  Gen: well appearing, in NAD Card: RRR Lungs: CTAB Ext: WWP, no edema ***  ASSESSMENT/PLAN:   No problem-specific Assessment & Plan notes found for this encounter.     Assessment and Plan Assessment & Plan         Donald CHRISTELLA Lai, DO "

## 2024-06-07 ENCOUNTER — Telehealth: Payer: Self-pay | Admitting: Family Medicine

## 2024-06-07 ENCOUNTER — Ambulatory Visit: Admitting: Family Medicine

## 2024-06-07 DIAGNOSIS — E162 Hypoglycemia, unspecified: Secondary | ICD-10-CM | POA: Diagnosis not present

## 2024-06-07 LAB — RENAL FUNCTION PANEL
Albumin: 3 g/dL — ABNORMAL LOW (ref 3.5–5.0)
Anion gap: 11 (ref 5–15)
BUN: 42 mg/dL — ABNORMAL HIGH (ref 8–23)
CO2: 23 mmol/L (ref 22–32)
Calcium: 9 mg/dL (ref 8.9–10.3)
Chloride: 106 mmol/L (ref 98–111)
Creatinine, Ser: 2.68 mg/dL — ABNORMAL HIGH (ref 0.61–1.24)
GFR, Estimated: 24 mL/min — ABNORMAL LOW
Glucose, Bld: 120 mg/dL — ABNORMAL HIGH (ref 70–99)
Phosphorus: 2.3 mg/dL — ABNORMAL LOW (ref 2.5–4.6)
Potassium: 3.4 mmol/L — ABNORMAL LOW (ref 3.5–5.1)
Sodium: 140 mmol/L (ref 135–145)

## 2024-06-07 LAB — GLUCOSE, CAPILLARY
Glucose-Capillary: 82 mg/dL (ref 70–99)
Glucose-Capillary: 122 mg/dL — ABNORMAL HIGH (ref 70–99)
Glucose-Capillary: 238 mg/dL — ABNORMAL HIGH (ref 70–99)
Glucose-Capillary: 55 mg/dL — ABNORMAL LOW (ref 70–99)
Glucose-Capillary: 66 mg/dL — ABNORMAL LOW (ref 70–99)
Glucose-Capillary: 69 mg/dL — ABNORMAL LOW (ref 70–99)
Glucose-Capillary: 100 mg/dL — ABNORMAL HIGH (ref 70–99)
Glucose-Capillary: 182 mg/dL — ABNORMAL HIGH (ref 70–99)

## 2024-06-07 LAB — CBC WITH DIFFERENTIAL/PLATELET
Basophils Absolute: 0 K/uL (ref 0.0–0.1)
Basophils Relative: 0 %
Eosinophils Absolute: 0.1 K/uL (ref 0.0–0.5)
Eosinophils Relative: 2 %
HCT: 41 % (ref 39.0–52.0)
Hemoglobin: 14 g/dL (ref 13.0–17.0)
Lymphocytes Relative: 23 %
Lymphs Abs: 1.3 K/uL (ref 0.7–4.0)
MCH: 30 pg (ref 26.0–34.0)
MCHC: 34.1 g/dL (ref 30.0–36.0)
MCV: 88 fL (ref 80.0–100.0)
Monocytes Absolute: 0.6 K/uL (ref 0.1–1.0)
Monocytes Relative: 11 %
Neutro Abs: 3.7 K/uL (ref 1.7–7.7)
Neutrophils Relative %: 64 %
Platelets: 47 K/uL — ABNORMAL LOW (ref 150–400)
RBC: 4.66 MIL/uL (ref 4.22–5.81)
RDW: 14.6 % (ref 11.5–15.5)
WBC: 5.7 K/uL (ref 4.0–10.5)
nRBC: 0 % (ref 0.0–0.2)

## 2024-06-07 LAB — MAGNESIUM: Magnesium: 1.6 mg/dL — ABNORMAL LOW (ref 1.7–2.4)

## 2024-06-07 LAB — PROCALCITONIN: Procalcitonin: 0.24 ng/mL

## 2024-06-07 MED ORDER — DONEPEZIL HCL 10 MG PO TABS
5.0000 mg | ORAL_TABLET | Freq: Every day | ORAL | Status: DC
  Administered 2024-06-07 – 2024-06-13 (×7): 5 mg via ORAL
  Filled 2024-06-07 (×7): qty 1

## 2024-06-07 MED ORDER — POTASSIUM CHLORIDE 20 MEQ PO PACK
40.0000 meq | PACK | Freq: Once | ORAL | Status: AC
  Administered 2024-06-07: 40 meq via ORAL
  Filled 2024-06-07: qty 2

## 2024-06-07 MED ORDER — MAGNESIUM SULFATE 2 GM/50ML IV SOLN
2.0000 g | Freq: Once | INTRAVENOUS | Status: AC
  Administered 2024-06-07: 2 g via INTRAVENOUS
  Filled 2024-06-07: qty 50

## 2024-06-07 MED ORDER — QUETIAPINE FUMARATE 25 MG PO TABS
25.0000 mg | ORAL_TABLET | Freq: Every day | ORAL | Status: DC
  Administered 2024-06-07 – 2024-06-08 (×3): 25 mg via ORAL
  Filled 2024-06-07 (×3): qty 1

## 2024-06-07 MED ORDER — K PHOS MONO-SOD PHOS DI & MONO 155-852-130 MG PO TABS
250.0000 mg | ORAL_TABLET | Freq: Three times a day (TID) | ORAL | Status: AC
  Administered 2024-06-07 – 2024-06-08 (×3): 250 mg via ORAL
  Filled 2024-06-07 (×3): qty 1

## 2024-06-07 MED ORDER — VANCOMYCIN HCL 1500 MG/300ML IV SOLN
1500.0000 mg | INTRAVENOUS | Status: DC
  Administered 2024-06-09: 1500 mg via INTRAVENOUS
  Filled 2024-06-07 (×2): qty 300

## 2024-06-07 MED ORDER — INSULIN GLARGINE 100 UNIT/ML ~~LOC~~ SOLN
10.0000 [IU] | Freq: Every day | SUBCUTANEOUS | Status: DC
  Administered 2024-06-08 – 2024-06-14 (×7): 10 [IU] via SUBCUTANEOUS
  Filled 2024-06-07 (×7): qty 0.1

## 2024-06-07 MED ORDER — GLUCOSE 40 % PO GEL
ORAL | Status: AC
  Administered 2024-06-07: 37.5 g
  Filled 2024-06-07: qty 1.21

## 2024-06-07 MED ORDER — INSULIN ASPART 100 UNIT/ML IJ SOLN
0.0000 [IU] | Freq: Three times a day (TID) | INTRAMUSCULAR | Status: DC
  Administered 2024-06-08: 2 [IU] via SUBCUTANEOUS
  Administered 2024-06-08: 7 [IU] via SUBCUTANEOUS
  Administered 2024-06-09: 2 [IU] via SUBCUTANEOUS
  Administered 2024-06-09: 9 [IU] via SUBCUTANEOUS
  Administered 2024-06-09: 5 [IU] via SUBCUTANEOUS
  Administered 2024-06-10: 9 [IU] via SUBCUTANEOUS
  Administered 2024-06-10: 2 [IU] via SUBCUTANEOUS
  Filled 2024-06-07: qty 5
  Filled 2024-06-07: qty 9
  Filled 2024-06-07: qty 2
  Filled 2024-06-07: qty 7
  Filled 2024-06-07: qty 2
  Filled 2024-06-07: qty 9
  Filled 2024-06-07: qty 2

## 2024-06-07 MED ORDER — SODIUM CHLORIDE 0.9 % IV SOLN
2.0000 g | INTRAVENOUS | Status: DC
  Administered 2024-06-08 – 2024-06-09 (×2): 2 g via INTRAVENOUS
  Filled 2024-06-07 (×2): qty 12.5

## 2024-06-07 NOTE — Progress Notes (Signed)
 Hypoglycemic Event  CBG: 69  Treatment: 8 oz juice/soda  Symptoms: Shaky  Follow-up CBG: Time:1700 CBG Result:66  Possible Reasons for Event: Unknown  Comments/MD notified: After follow-up from CBG checked at 1700 provided patient with 4oz juice, graham crackers and deli sandwich, cbg=55. Pt with increased confusion but able to tolerate PO intake and transfer from chair to bed. Administered glucose gel, Cbg=100 @1810 . Pt in bed eating dinner tray. [place] MD notified of event.   Anthony [name]

## 2024-06-07 NOTE — Evaluation (Signed)
 Occupational Therapy Evaluation Patient Details Name: Anthony [name] MRN: [id number] DOB: 1950-11-06 Today's Date: 06/07/2024   History of Present Illness   Anthony [name] is a 74 y.o. male who presents emergency department altered mental status.  Patient has been complaining of dysuria and weakness. Patient is currently being managed for sepsis/likely pneumonia.  PMH: diabetes mellitus, HTN, HLD, GERD, CHF status post ICD, CAD, CKD, chronic atrial fibrillation, CVA, DM 2     Clinical Impressions PTA, pt lives with spouse, typically ambulatory with a RW and mostly Modified Independent with ADLs. Pt presents now with deficits in cognition (normally cognitively intact per wife), standing balance and strength. Pt able to stand with Min A progressing to CGA, ambulate around room using RW with CGA (slow pace baseline per wife). Pt requires overall Min A for ADL completion. As pt with progress from earlier PT eval to OT eval in PM, anticipate good progress while admitted to DC home with HHOT. Will continue to follow acutely and update DC recs as needed.     If plan is discharge home, recommend the following:   A little help with bathing/dressing/bathroom;Direct supervision/assist for financial management;Direct supervision/assist for medications management;Assist for transportation;Help with stairs or ramp for entrance     Functional Status Assessment   Patient has had a recent decline in their functional status and demonstrates the ability to make significant improvements in function in a reasonable and predictable amount of time.     Equipment Recommendations   None recommended by OT     Recommendations for Other Services         Precautions/Restrictions   Precautions Precautions: Fall Recall of Precautions/Restrictions: Impaired Restrictions Weight Bearing Restrictions Per Provider Order: No     Mobility Bed Mobility               General bed mobility comments:  in recliner on entry    Transfers Overall transfer level: Needs assistance Equipment used: Rolling walker (2 wheels) Transfers: Sit to/from Stand Sit to Stand: Contact guard assist, Min assist           General transfer comment: initially Min A from recliner, progressing to CGA during session.      Balance Overall balance assessment: Needs assistance Sitting-balance support: No upper extremity supported, Feet supported Sitting balance-Leahy Scale: Fair     Standing balance support: Bilateral upper extremity supported, During functional activity Standing balance-Leahy Scale: Poor                             ADL either performed or assessed with clinical judgement   ADL Overall ADL's : Needs assistance/impaired Eating/Feeding: Set up   Grooming: Contact guard assist;Standing   Upper Body Bathing: Minimal assistance;Sitting   Lower Body Bathing: Minimal assistance;Sitting/lateral leans;Sit to/from stand   Upper Body Dressing : Minimal assistance   Lower Body Dressing: Minimal assistance;Sitting/lateral leans;Sit to/from stand   Toilet Transfer: Contact guard assist;Ambulation;Rolling walker (2 wheels)   Toileting- Clothing Manipulation and Hygiene: Sitting/lateral lean;Sit to/from stand;Contact guard assist       Functional mobility during ADLs: Contact guard assist;Rolling walker (2 wheels) General ADL Comments: pt able to mobilize in room with RW with slow pace (baseline per wife), improved ability to stand. primarily limited by cognitive deficits     Vision Baseline Vision/History: 1 Wears glasses Ability to See in Adequate Light: 0 Adequate Patient Visual Report: No change from baseline Vision Assessment?: No apparent visual deficits  Perception         Praxis         Pertinent Vitals/Pain Pain Assessment Pain Assessment: No/denies pain     Extremity/Trunk Assessment Upper Extremity Assessment Upper Extremity Assessment:  Generalized weakness;Right hand dominant   Lower Extremity Assessment Lower Extremity Assessment: Defer to PT evaluation   Cervical / Trunk Assessment Cervical / Trunk Assessment: Kyphotic   Communication Communication Communication: Impaired Factors Affecting Communication: Reduced clarity of speech   Cognition Arousal: Alert Behavior During Therapy: WFL for tasks assessed/performed, Flat affect Cognition: Cognition impaired   Orientation impairments: Situation, Time Awareness: Online awareness impaired Memory impairment (select all impairments): Short-term memory, Declarative long-term memory, Working memory Attention impairment (select first level of impairment): Selective attention Executive functioning impairment (select all impairments): Sequencing, Reasoning, Problem solving OT - Cognition Comments: pt pleasantly confused. Home setup and PLOF fairly accurate though some discrepancies when spoke to wife after session on phone. cues for sequencing, problem solving and attention to tasks.                 Following commands: Impaired Following commands impaired: Follows one step commands with increased time     Cueing  General Comments   Cueing Techniques: Verbal cues;Tactile cues;Gestural cues;Visual cues  HR 70-71 throughout on tele monitor   Exercises     Shoulder Instructions      Home Living Family/patient expects to be discharged to:: Private residence Living Arrangements: Spouse/significant other Available Help at Discharge: Family Type of Home: House Home Access: Stairs to enter Secretary/administrator of Steps: 9 Entrance Stairs-Rails: Right;Left;Can reach both Home Layout: One level     Bathroom Shower/Tub: Chief Strategy Officer: Standard Bathroom Accessibility: Yes How Accessible: Accessible via walker Home Equipment: Rolling Walker (2 wheels);Cane - single point;Shower seat   Additional Comments: pt provided information aboce  but no family present to verify and slow to respond with conflicting reports throughout eval      Prior Functioning/Environment Prior Level of Function : Independent/Modified Independent             Mobility Comments: RW for mobility ADLs Comments: Mod I for ADLs mostly, wife supervises for shower safety, occasionally with urine incontinence accidents in the home. Wife managing IADLs. Pt normally cognitively intact per wife    OT Problem List: Decreased strength;Decreased activity tolerance;Impaired balance (sitting and/or standing);Decreased cognition;Decreased knowledge of use of DME or AE   OT Treatment/Interventions: Self-care/ADL training;Therapeutic exercise;Energy conservation;DME and/or AE instruction;Therapeutic activities;Patient/family education;Balance training      OT Goals(Current goals can be found in the care plan section)   Acute Rehab OT Goals Patient Stated Goal: pt would like to get stronger, not move so fast OT Goal Formulation: With patient/family Time For Goal Achievement: 06/21/24 Potential to Achieve Goals: Good   OT Frequency:  Min 2X/week    Co-evaluation              AM-PAC OT 6 Clicks Daily Activity     Outcome Measure Help from another person eating meals?: A Little Help from another person taking care of personal grooming?: A Little Help from another person toileting, which includes using toliet, bedpan, or urinal?: A Little Help from another person bathing (including washing, rinsing, drying)?: A Little Help from another person to put on and taking off regular upper body clothing?: A Little Help from another person to put on and taking off regular lower body clothing?: A Little 6 Click Score: 18   End of  Session Equipment Utilized During Treatment: Gait belt;Rolling walker (2 wheels) Nurse Communication: Mobility status  Activity Tolerance: Patient tolerated treatment well Patient left: in chair;with call bell/phone within  reach;with chair alarm set;with nursing/sitter in room  OT Visit Diagnosis: Unsteadiness on feet (R26.81);Other abnormalities of gait and mobility (R26.89);Muscle weakness (generalized) (M62.81);Other symptoms and signs involving cognitive function                Time: 1331-1357 OT Time Calculation (min): 26 min Charges:  OT General Charges $OT Visit: 1 Visit OT Evaluation $OT Eval Moderate Complexity: 1 Mod OT Treatments $Therapeutic Activity: 8-22 mins  Anthony [name], OTR/L Acute Rehab Services Office: [phone]   Anthony Fish 06/07/2024, 2:29 PM

## 2024-06-07 NOTE — Evaluation (Signed)
 Physical Therapy Evaluation Patient Details Name: DETAVIOUS RINN MRN: [id number] DOB: 06/16/1950 Today's Date: 06/07/2024  History of Present Illness  Anthony [name] is a 74 y.o. male who presents emergency department altered mental status.  Patient has been complaining of dysuria and weakness. Patient is currently being managed for sepsis/likely pneumonia.  PMH: diabetes mellitus, HTN, HLD, GERD, CHF status post ICD, CAD, CKD, chronic atrial fibrillation, CVA, DM 2  Clinical Impression  Pt admitted with above diagnosis. PTA, pt reports ind with SPC or RW, initially reports home alone but later references a wife, answers change regarding mobility and home setup up and no family present on eval so unsure of validity. On eval, pt with generalized weakness, AROM WFL, needing mind-mod A to power up from recliner and bedside for multiple reps. Pt amb 15 ft from recliner to other side of bed with min A and constant cues for body positioning wtihin RW and posture and foot clearance, then able to amb back after seated rest break with same presentation. Pt remains up in recliner and tells therapist to call spouse at EOS, pt able to provide phone number. Anticipate HHPT at d/c with spouse support. Pt currently with functional limitations due to the deficits listed below (see PT Problem List). Pt will benefit from acute skilled PT to increase their independence and safety with mobility to allow discharge.           If plan is discharge home, recommend the following: A little help with walking and/or transfers;A little help with bathing/dressing/bathroom;Assistance with cooking/housework;Assist for transportation;Help with stairs or ramp for entrance   Can travel by private vehicle        Equipment Recommendations None recommended by PT  Recommendations for Other Services       Functional Status Assessment Patient has had a recent decline in their functional status and demonstrates the ability to make  significant improvements in function in a reasonable and predictable amount of time.     Precautions / Restrictions Precautions Precautions: Fall Recall of Precautions/Restrictions: Impaired Precaution/Restrictions Comments: ICD present Restrictions Weight Bearing Restrictions Per Provider Order: No      Mobility  Bed Mobility               General bed mobility comments: in recliner upon arrival    Transfers Overall transfer level: Needs assistance Equipment used: Rolling walker (2 wheels) Transfers: Sit to/from Stand Sit to Stand: Mod assist, Min assist           General transfer comment: mod-min A for power to stand from recliner and bedside, cues for BLE positioning, BLE/UE activation to power up and for initiation, slow to rise up with hip extension last, reliant on BUE assisting for controlled return to sitting    Ambulation/Gait Ambulation/Gait assistance: Min assist Gait Distance (Feet): 15 Feet (x2) Assistive device: Rolling walker (2 wheels) Gait Pattern/deviations: Step-to pattern, Decreased stride length, Shuffle, Trunk flexed Gait velocity: decreased     General Gait Details: short, slow, shuffling steps wtih RW, trunk forward flexed with RW held at extended arms distance, constant cues and assist to maintain body within RW frame, min A to steady, cues for foot clearance without improvement, 15 ft around bed to other side and then back to recliner after seated rest break  Stairs            Wheelchair Mobility     Tilt Bed    Modified Rankin (Stroke Patients Only)  Balance Overall balance assessment: Needs assistance         Standing balance support: Reliant on assistive device for balance, During functional activity, Bilateral upper extremity supported Standing balance-Leahy Scale: Poor Standing balance comment: min A with RW                             Pertinent Vitals/Pain Pain Assessment Pain Assessment:  No/denies pain    Home Living Family/patient expects to be discharged to:: Private residence Living Arrangements: Spouse/significant other Available Help at Discharge: Family Type of Home: House Home Access: Stairs to enter Entrance Stairs-Rails: Right;Left;Can reach both Entrance Stairs-Number of Steps: 10   Home Layout: One level Home Equipment: Agricultural Consultant (2 wheels);Cane - single point Additional Comments: pt provided information aboce but no family present to verify and slow to respond with conflicting reports throughout eval    Prior Function Prior Level of Function : Independent/Modified Independent;Patient poor historian/Family not available             Mobility Comments: pt difficult to follow, reports using SPC then also reports using RW, unable to elaborate on when using each device, no family present on eval ADLs Comments: pt reports ind and lives alone, then later states has a wife who cooks the meals, no family present on eval     Extremity/Trunk Assessment   Upper Extremity Assessment Upper Extremity Assessment: Defer to OT evaluation    Lower Extremity Assessment Lower Extremity Assessment: Generalized weakness (AROM WFL, strength grossly 3+/5, denies numbness/tingling)    Cervical / Trunk Assessment Cervical / Trunk Assessment: Kyphotic  Communication   Communication Communication: Impaired Factors Affecting Communication: Reduced clarity of speech (garbled speech at times)    Cognition Arousal: Lethargic Behavior During Therapy: Flat affect   PT - Cognitive impairments: No family/caregiver present to determine baseline                       PT - Cognition Comments: pt able to state name DOB, location and month/year correctly but slow to repsond, changes answers to questions at times, restless with lines requiring redirection to task Following commands: Impaired Following commands impaired: Follows one step commands with increased  time     Cueing Cueing Techniques: Verbal cues, Tactile cues, Gestural cues, Visual cues     General Comments General comments (skin integrity, edema, etc.): HR 70-71 throughout on tele monitor    Exercises     Assessment/Plan    PT Assessment Patient needs continued PT services  PT Problem List Decreased strength;Decreased activity tolerance;Decreased balance;Decreased mobility;Decreased cognition;Decreased knowledge of use of DME       PT Treatment Interventions DME instruction;Gait training;Stair training;Functional mobility training;Therapeutic activities;Therapeutic exercise;Balance training;Patient/family education    PT Goals (Current goals can be found in the Care Plan section)  Acute Rehab PT Goals Patient Stated Goal: return home PT Goal Formulation: With patient Time For Goal Achievement: 06/21/24 Potential to Achieve Goals: Good    Frequency Min 3X/week     Co-evaluation               AM-PAC PT 6 Clicks Mobility  Outcome Measure Help needed turning from your back to your side while in a flat bed without using bedrails?: A Little Help needed moving from lying on your back to sitting on the side of a flat bed without using bedrails?: A Little Help needed moving to and from a bed to a  chair (including a wheelchair)?: A Little Help needed standing up from a chair using your arms (e.g., wheelchair or bedside chair)?: A Lot Help needed to walk in hospital room?: A Little Help needed climbing 3-5 steps with a railing? : A Lot 6 Click Score: 16    End of Session Equipment Utilized During Treatment: Gait belt Activity Tolerance: Patient tolerated treatment well Patient left: in chair;with call bell/phone within reach;with chair alarm set Nurse Communication: Mobility status PT Visit Diagnosis: Unsteadiness on feet (R26.81);Muscle weakness (generalized) (M62.81);Difficulty in walking, not elsewhere classified (R26.2)    Time: 8885-8852 PT Time Calculation  (min) (ACUTE ONLY): 33 min   Charges:   PT Evaluation $PT Eval Low Complexity: 1 Low PT Treatments $Therapeutic Activity: 8-22 mins PT General Charges $$ ACUTE PT VISIT: 1 Visit         Tori Erinn Mendosa PT, DPT 06/07/2024, 12:29 PM

## 2024-06-07 NOTE — Progress Notes (Addendum)
 Called to see patient regarding increasing confusion, mild agitation and patient expressing a desire to leave AMA.  Wife attempting to speak with patient over the phone but was unsuccessful in redirecting patient.  Patient was calm and not aggressive and Chane on the phone with his wife but did make eye contact with me when I spoke to him.  After a long conversation with his wife and additional redirecting from my standpoint patient appeared to have some understanding of the need to stay in the hospital until medically ready.  He did tell his wife that he would see her in the morning and once again I redirected him that she would come to get him if he were ready to come home.  He seemed to accept this.  I asked him if removing the telemetry would help him rest better and he stated yes.  Telemetry was discontinued.  Asked patient if he would like to walk around the unit to see if that would help him feel less trapped and he said yes.  He remains very confused.  Having trouble formulating thoughts.  At times is mumbling.  I do not see a history of dementia this patient's chart but given tonight's of sundowning type symptoms I suspect he has undiagnosed dementia.  I will start low-dose Aricept  tonight but need to monitor for GI symptoms, bradycardia and orthostasis.

## 2024-06-07 NOTE — Telephone Encounter (Signed)
 Patient was identified as falling into the [place] Measure - Diabetes.   Patient was: Patient is not currently using our practice.

## 2024-06-07 NOTE — Progress Notes (Signed)
 PHARMACY NOTE:  ANTIMICROBIAL RENAL DOSAGE ADJUSTMENT  Current antimicrobial regimen includes a mismatch between antimicrobial dosage and estimated renal function. As per policy approved by the Pharmacy & Therapeutics and Medical Executive Committees, the antimicrobial dosage will be adjusted accordingly.  Current antimicrobial and dosage:  Cefepime  2g q12 hr  Indication: sepsis/PNA  Renal Function:   Estimated Creatinine Clearance: 28.2 mL/min (A) (by C-G formula based on SCr of 2.68 mg/dL (H)). []      On intermittent HD, scheduled: []      On CRRT    Antimicrobial dosage has been changed to:  2g IV q24 hr   Additional Comments: n/a   Thank you for allowing pharmacy to be a part of this patient's care.  Bard Jeans, PharmD, BCPS [phone] 06/07/2024, 1:18 PM

## 2024-06-07 NOTE — Progress Notes (Addendum)
 " PROGRESS NOTE    Anthony [name]  FMW:[id number] DOB: 17-Mar-1951 DOA: 06/05/2024 PCP: Madelon Donald HERO, DO  Outpatient Specialists:     Brief Narrative:  Patient is a 74 year old male past medical history significant for atrial fibrillation, CHF, depression, hypertension, hyperlipidemia and CVA.  Patient was admitted with altered mental status.  Patient is currently being managed for sepsis/likely pneumonia.  06/06/2024: Patient seen.  Patient is a poor historian.  Patient is currently on IV vancomycin  and cefepime . 06/07/2024: Patient seen alongside patient's nurse.  Patient could not tell us  the year nor the month.  Cannot rule out previously undiagnosed dementing illness.  Patient became hypoglycemic today.  Will change subcutaneous Semglee  from 20 units daily to 10 units daily from tomorrow.  Will adjust sliding scale insulin  coverage.  Check A1c.  Caloric count.   Assessment & Plan:   Principal Problem:   Sepsis (HCC)   Sepsis/possible pneumonia: Acute metabolic encephalopathy: -CT chest is said to reveal possible pneumonia. - Follow cultures. - Continue antibiotics (IV vancomycin  and cefepime ). - Manage expectantly.  Hyperglycemia: - Blood sugar of 387 on presentation. - Blood sugar control has improved significantly. - Continue to monitor blood sugar closely.   06/07/2024: Patient became hypoglycemic today (see above documentation).  Monitor blood sugar closely.  Follow A1c.  Hypertensive urgency:  - Continue carvedilol , losartan , hydrochlorothiazide  06/07/2024: Blood pressure is controlled.   Hyperlipidemia: - Continue Crestor .  BPH: - Continue Flomax .   Constipation: -continue Linzess    A-fib: -continue Eliquis , Coreg   Hypokalemia:  - Potassium of 3.4. -Replete - Continue to monitor and replete.  Hypomagnesemia: - Magnesium  of 1.6. - IV magnesium  2 g 1 dose. - Continue to monitor and replete.  Hypophosphatemia: - Phosphorus of 2.3. - Neutra-Phos. -  Continue to monitor and replace.   DVT prophylaxis: Eliquis  Code Status: Full code Family Communication:  Disposition Plan:    Consultants:  None.  Procedures:  None.  Antimicrobials:  IV vancomycin  IV cefepime    Subjective: -Poor historian.  Objective: Vitals:   06/07/24 0407 06/07/24 0518 06/07/24 0955 06/07/24 1331  BP: 137/79 (!) 140/72 129/72 112/78  Pulse: 70 70 70 70  Resp: 17 18 16 18   Temp: 98.8 F (37.1 C) 98.6 F (37 C) 98 F (36.7 C) 97.7 F (36.5 C)  TempSrc: Oral Oral Oral Axillary  SpO2: 95% 99% 100% 98%  Weight:      Height:        Intake/Output Summary (Last 24 hours) at 06/07/2024 1949 Last data filed at 06/07/2024 1900 Gross per 24 hour  Intake 960 ml  Output 1300 ml  Net -340 ml   Filed Weights   06/05/24 2200  Weight: 94.3 kg    Examination:  General exam: Chronically ill looking.  In any distress.  Awake and alert.    Respiratory system: Clear to auscultation. Respiratory effort normal. Cardiovascular system: S1 & S2  Gastrointestinal system: Abdomen is soft and nontender.  Central nervous system: Awake and alert.     Data Reviewed: I have personally reviewed following labs and imaging studies  CBC: Recent Labs  Lab 06/05/24 1601 06/07/24 0342  WBC 6.8 5.7  NEUTROABS 5.1 3.7  HGB 16.5 14.0  HCT 50.4 41.0  MCV 89.8 88.0  PLT 41* 47*   Basic Metabolic Panel: Recent Labs  Lab 06/05/24 1715 06/05/24 1930 06/05/24 2220 06/07/24 0342  NA 141 141 140 140  K 3.6 3.8 3.9 3.4*  CL 99 102 103 106  CO2 19* 13*  19* 23  GLUCOSE 472* 416* 414* 120*  BUN 45* 48* 48* 42*  CREATININE 2.58* 2.54* 2.51* 2.68*  CALCIUM  9.7 9.3 9.4 9.0  MG 2.0  --   --  1.6*  PHOS  --   --   --  2.3*   GFR: Estimated Creatinine Clearance: 28.2 mL/min (A) (by C-G formula based on SCr of 2.68 mg/dL (H)). Liver Function Tests: Recent Labs  Lab 06/05/24 1715 06/07/24 0342  AST 27  --   ALT 23  --   ALKPHOS 143*  --   BILITOT 1.8*  --    PROT 6.6  --   ALBUMIN 3.7 3.0*   No results for input(s): LIPASE, AMYLASE in the last 168 hours. Recent Labs  Lab 06/05/24 1715  AMMONIA 32   Coagulation Profile: No results for input(s): INR, PROTIME in the last 168 hours. Cardiac Enzymes: No results for input(s): CKTOTAL, CKMB, CKMBINDEX, TROPONINI in the last 168 hours. BNP (last 3 results) No results for input(s): PROBNP in the last 8760 hours. HbA1C: No results for input(s): HGBA1C in the last 72 hours. CBG: Recent Labs  Lab 06/07/24 1154 06/07/24 1631 06/07/24 1700 06/07/24 1726 06/07/24 1810  GLUCAP 238* 69* 66* 55* 100*   Lipid Profile: No results for input(s): CHOL, HDL, LDLCALC, TRIG, CHOLHDL, LDLDIRECT in the last 72 hours. Thyroid  Function Tests: No results for input(s): TSH, T4TOTAL, FREET4, T3FREE, THYROIDAB in the last 72 hours. Anemia Panel: No results for input(s): VITAMINB12, FOLATE, FERRITIN, TIBC, IRON, RETICCTPCT in the last 72 hours. Urine analysis:    Component Value Date/Time   COLORURINE YELLOW 06/05/2024 1533   APPEARANCEUR CLEAR 06/05/2024 1533   LABSPEC 1.014 06/05/2024 1533   PHURINE 5.0 06/05/2024 1533   GLUCOSEU >=500 (A) 06/05/2024 1533   HGBUR MODERATE (A) 06/05/2024 1533   HGBUR moderate 09/07/2009 1003   BILIRUBINUR NEGATIVE 06/05/2024 1533   KETONESUR 20 (A) 06/05/2024 1533   PROTEINUR >=300 (A) 06/05/2024 1533   UROBILINOGEN 0.2 09/07/2009 1003   NITRITE NEGATIVE 06/05/2024 1533   LEUKOCYTESUR NEGATIVE 06/05/2024 1533   Sepsis Labs: @LABRCNTIP (procalcitonin:4,lacticidven:4)  )No results found for this or any previous visit (from the past 240 hours).       Radiology Studies: CT CHEST ABDOMEN PELVIS WO CONTRAST Result Date: 06/06/2024 EXAM: CT CHEST, ABDOMEN AND PELVIS WITHOUT CONTRAST 06/06/2024 12:24:19 AM TECHNIQUE: CT of the chest, abdomen and pelvis was performed without the administration of intravenous  contrast. Multiplanar reformatted images are provided for review. Automated exposure control, iterative reconstruction, and/or weight based adjustment of the mA/kV was utilized to reduce the radiation dose to as low as reasonably achievable. COMPARISON: None available. CLINICAL HISTORY: sepsis FINDINGS: CHEST: MEDIASTINUM AND LYMPH NODES: Enlarged heart. pericardium are unremarkable. 4-vessel coronary artery calcifications. The central airways are clear. No mediastinal, hilar or axillary lymphadenopathy. Possible trace pyuria appears associated with fluid within the esophageal lumen. No esophageal wall thickening. The thyroid  gland is unremarkable. LUNGS AND PLEURA: Patchy ground-glass opacities of the right lower lobe (4.3). No pleural effusion. No pneumothorax. ABDOMEN AND PELVIS: LIVER: Unremarkable. GALLBLADDER AND BILE DUCTS: Unremarkable. No biliary ductal dilatation. SPLEEN: No acute abnormality. PANCREAS: Diffusely atrophic pancreas. No focal lesion. Otherwise normal pancreatic contour. No surrounding inflammatory changes. No main pancreatic ductal dilatation. ADRENAL GLANDS: No acute abnormality. KIDNEYS, URETERS AND BLADDER: Fluid density of the kidneys likely represent simple renal cysts. Simple renal cysts do not require additional follow-up unless clinically indicated due to signs/symptoms. Bilateral renal cortical scarring. Nonspecific bilateral perinephric fat  stranding. No stones in the kidneys or ureters. No hydronephrosis. Urinary bladder is unremarkable. GI AND BOWEL: Stomach demonstrates no acute abnormality. No small or large bowel thickening or dilatation. The appendix is unremarkable. There is no bowel obstruction. REPRODUCTIVE ORGANS: No acute abnormality. PERITONEUM AND RETROPERITONEUM: No ascites. No free air. VASCULATURE: Moderate atherosclerotic plaque of the aorta. Severe atherosclerotic plaque of the abdominal aorta. ABDOMINAL AND PELVIS LYMPH NODES: No lymphadenopathy. REPRODUCTIVE  ORGANS: No acute abnormality. 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 Grade 2 anterolisthesis of L5 on S1 with impaction of the L5 vertebral body onto the S1 superior endplate, with complete loss of the intervertebral disc space. Left chest wall dual lead cardiac defibrillator. Small fat-containing umbilical hernia. IMPRESSION: 1. Patchy ground-glass opacities in the right lower lobe, which may reflect an infectious or inflammatory process. 2. Severe atherosclerotic plaque of the abdominal aorta including 4-vessel coronary artery calcifications. 3. Other, non-acute and/or normal findings as above. Electronically signed by: Morgane Naveau MD MD 06/06/2024 12:36 AM EST RP Workstation: [id number]        Scheduled Meds:  apixaban   5 mg Oral BID   carvedilol   25 mg Oral BID   dorzolamide -timolol   1 drop Both Eyes BID   gabapentin   300 mg Oral Q8H   [START ON 06/08/2024] insulin  aspart  0-9 Units Subcutaneous TID WC   [START ON 06/08/2024] insulin  glargine  10 Units Subcutaneous Daily   latanoprost   1 drop Both Eyes QHS   losartan   50 mg Oral Daily   pantoprazole   40 mg Oral Daily   PARoxetine   30 mg Oral Daily   QUEtiapine   25 mg Oral QHS   rosuvastatin   20 mg Oral Daily   tamsulosin   0.4 mg Oral Daily   Continuous Infusions:  [START ON 06/08/2024] ceFEPime  (MAXIPIME ) IV     [START ON 06/09/2024] vancomycin        LOS: 2 days    Time spent: 55 minutes.    Leatrice Chapel, MD  [facility] 7PM-7AM contact night coverage as above    "

## 2024-06-08 ENCOUNTER — Encounter (HOSPITAL_COMMUNITY): Payer: Self-pay | Admitting: Internal Medicine

## 2024-06-08 DIAGNOSIS — E440 Moderate protein-calorie malnutrition: Secondary | ICD-10-CM | POA: Insufficient documentation

## 2024-06-08 LAB — HEMOGLOBIN A1C
Hgb A1c MFr Bld: 9.4 % — ABNORMAL HIGH (ref 4.8–5.6)
Mean Plasma Glucose: 223.08 mg/dL

## 2024-06-08 LAB — RENAL FUNCTION PANEL
Albumin: 2.8 g/dL — ABNORMAL LOW (ref 3.5–5.0)
Anion gap: 11 (ref 5–15)
BUN: 42 mg/dL — ABNORMAL HIGH (ref 8–23)
CO2: 23 mmol/L (ref 22–32)
Calcium: 8.7 mg/dL — ABNORMAL LOW (ref 8.9–10.3)
Chloride: 107 mmol/L (ref 98–111)
Creatinine, Ser: 2.65 mg/dL — ABNORMAL HIGH (ref 0.61–1.24)
GFR, Estimated: 25 mL/min — ABNORMAL LOW
Glucose, Bld: 205 mg/dL — ABNORMAL HIGH (ref 70–99)
Phosphorus: 2.5 mg/dL (ref 2.5–4.6)
Potassium: 3.4 mmol/L — ABNORMAL LOW (ref 3.5–5.1)
Sodium: 140 mmol/L (ref 135–145)

## 2024-06-08 LAB — GLUCOSE, CAPILLARY
Glucose-Capillary: 192 mg/dL — ABNORMAL HIGH (ref 70–99)
Glucose-Capillary: 198 mg/dL — ABNORMAL HIGH (ref 70–99)
Glucose-Capillary: 200 mg/dL — ABNORMAL HIGH (ref 70–99)
Glucose-Capillary: 202 mg/dL — ABNORMAL HIGH (ref 70–99)
Glucose-Capillary: 262 mg/dL — ABNORMAL HIGH (ref 70–99)
Glucose-Capillary: 340 mg/dL — ABNORMAL HIGH (ref 70–99)

## 2024-06-08 LAB — MRSA NEXT GEN BY PCR, NASAL: MRSA by PCR Next Gen: NOT DETECTED

## 2024-06-08 LAB — MAGNESIUM: Magnesium: 1.8 mg/dL (ref 1.7–2.4)

## 2024-06-08 MED ORDER — ENSURE PLUS HIGH PROTEIN PO LIQD
237.0000 mL | Freq: Two times a day (BID) | ORAL | Status: DC
  Administered 2024-06-08 – 2024-06-14 (×8): 237 mL via ORAL

## 2024-06-08 MED ORDER — POTASSIUM CHLORIDE 10 MEQ/100ML IV SOLN
10.0000 meq | INTRAVENOUS | Status: AC
  Administered 2024-06-08 (×3): 10 meq via INTRAVENOUS
  Filled 2024-06-08 (×3): qty 100

## 2024-06-08 MED ORDER — ENSURE PLUS HIGH PROTEIN PO LIQD: 237.0000 mL | ORAL | Status: DC

## 2024-06-08 NOTE — Progress Notes (Signed)
" °  Progress Note   Patient: Anthony [name] FMW:[id number] DOB: Sep 30, 1950 DOA: 06/05/2024     3 DOS: the patient was seen and examined on 06/08/2024   Brief hospital course: Patient is a 74 year old male past medical history significant for atrial fibrillation, CHF, depression, hypertension, hyperlipidemia and CVA.  Patient was admitted with altered mental status.  Patient is currently being managed for sepsis/likely pneumonia.   06/06/2024: Patient seen.  Patient is a poor historian.  Patient is currently on IV vancomycin  and cefepime . 06/07/2024: Patient seen alongside patient's nurse.  Patient could not tell us  the year nor the month.  Cannot rule out previously undiagnosed dementing illness.  Patient became hypoglycemic today.  Will change subcutaneous Semglee  from 20 units daily to 10 units daily from tomorrow.  Will adjust sliding scale insulin  coverage.  Check A1c.  Caloric count.    Assessment and Plan:  Sepsis/possible pneumonia: Acute metabolic encephalopathy: -CT chest: Patchy ground-glass opacities in the right lower lobe - Follow up cultures. - Continue antibiotics (IV vancomycin  and cefepime ).    Hyperglycemia: - Blood sugar of 387 on presentation. - Blood sugar control has improved significantly. - Continue to monitor blood sugar closely.   06/07/2024: Patient became hypoglycemic  (see above documentation).   -Monitor blood sugar closely. A1c 9.1. - Continue Lantus  10 units daily and sliding scale   Hypertensive urgency:  - Continue carvedilol , losartan , hydrochlorothiazide  - Hydralazine  as needed 06/07/2024: Blood pressure is controlled.   Hyperlipidemia: - Continue Crestor .   BPH: - Continue Flomax .   Constipation: -continue Linzess    A-fib: -continue Eliquis , Coreg    Hypokalemia:  - Potassium of 3.4. -Replete - Continue to monitor and replete.   Hypomagnesemia: - Magnesium  of 1.8. - S/p IV magnesium  2 g 1 dose. - Continue to monitor and replete.    Hypophosphatemia: - Phosphorus of 2.5. - Neutra-Phos. - Continue to monitor         Subjective: Denies any fever, nausea vomiting headache chest pain or palpitations.  Physical Exam: Vitals:   06/07/24 1331 06/07/24 2108 06/08/24 0511 06/08/24 1208  BP: 112/78 (!) 157/87 (!) 148/85 (!) 153/77  Pulse: 70 70 70 69  Resp: 18 16 19 18   Temp: 97.7 F (36.5 C) 98.4 F (36.9 C) 98.2 F (36.8 C) 97.9 F (36.6 C)  TempSrc: Axillary Oral Oral Oral  SpO2: 98% 100% 99% 100%  Weight:      Height:       Constitutional: Alert, awake, calm, comfortable HEENT: Neck supple Respiratory: Clear to auscultation B/L, no wheezing, no rales.  Cardiovascular: Regular rate and rhythm, no murmurs / rubs / gallops. No extremity edema. 2+ pedal pulses. No carotid bruits.  Abdomen: Soft, no tenderness, Bowel sounds positive.  Musculoskeletal: no clubbing / cyanosis. Good ROM, no contractures. Normal muscle tone.  Skin: no rashes, lesions, ulcers. Neurologic: CN 2-12 grossly intact. Sensation intact, No focal deficit identified Psychiatric: Alert and oriented x 3. Normal mood.    Data Reviewed:  reviewed  Family Communication: None  Disposition: Status is: Inpatient Remains inpatient appropriate because: Due to ongoing recovery from sepsis  Planned Discharge Destination: Home    Time spent: 35 minutes  Author: Nena Rebel, MD 06/08/2024 2:51 PM  For on call review [email or web address].  "

## 2024-06-08 NOTE — Plan of Care (Signed)
" °  Problem: Clinical Measurements: Goal: Cardiovascular complication will be avoided Outcome: Progressing   Problem: Nutrition: Goal: Adequate nutrition will be maintained Outcome: Progressing   Problem: Elimination: Goal: Will not experience complications related to bowel motility Outcome: Progressing   Problem: Pain Managment: Goal: General experience of comfort will improve and/or be controlled Outcome: Progressing   Problem: Safety: Goal: Ability to remain free from injury will improve Outcome: Progressing   "

## 2024-06-08 NOTE — Progress Notes (Signed)
 OT Cancellation Note  Patient Details Name: Anthony [name] MRN: [id number] DOB: [birth year]   Cancelled Treatment:    Reason Eval/Treat Not Completed: Other (comment) Pt currently working on eating breakfast. Will follow up for OT session as schedule permits.  Mliss Fish 06/08/2024, 10:07 AM

## 2024-06-08 NOTE — Hospital Course (Signed)
 Patient is a 74 year old male past medical history significant for atrial fibrillation, CHF, depression, hypertension, hyperlipidemia and CVA.  Patient was admitted with altered mental status.  Patient is currently being managed for sepsis/likely pneumonia.   06/06/2024: Patient seen.  Patient is a poor historian.  Patient is currently on IV vancomycin  and cefepime . 06/07/2024: Patient seen alongside patient's nurse.  Patient could not tell us  the year nor the month.  Cannot rule out previously undiagnosed dementing illness.  Patient became hypoglycemic today.  Will change subcutaneous Semglee  from 20 units daily to 10 units daily from tomorrow.  Will adjust sliding scale insulin  coverage.  Check A1c.  Caloric count.

## 2024-06-08 NOTE — Progress Notes (Signed)
 Occupational Therapy Treatment Patient Details Name: Anthony [name] MRN: [id number] DOB: 1950/10/24 Today's Date: 06/08/2024   History of present illness Anthony [name] is a 74 y.o. male who presents emergency department altered mental status.  Patient has been complaining of dysuria and weakness. Patient is currently being managed for sepsis/likely pneumonia.  PMH: diabetes mellitus, HTN, HLD, GERD, CHF status post ICD, CAD, CKD, chronic atrial fibrillation, CVA, DM 2   OT comments  Pt functional decline from initial OT eval yesterday. Pt cognition remains altered from baseline though pt showing insight into feeling mixed up today. Pt more limited today by new asterixis-type movements with extremities suddenly giving out when attempting to bring cup to mouth, when sitting EOB, etc. Pt able to stand briefly using RW with Min A but unable to safely progress OOB mobility d/t imbalance and incoordination. Based on new changes and ongoing cognitive issues, changing DC recommendations to inpatient follow up therapy, <3 hours/day.       If plan is discharge home, recommend the following:  A little help with bathing/dressing/bathroom;Direct supervision/assist for financial management;Direct supervision/assist for medications management;Assist for transportation;Help with stairs or ramp for entrance;A little help with walking and/or transfers   Equipment Recommendations  Other (comment) (TBD)    Recommendations for Other Services      Precautions / Restrictions Precautions Precautions: Fall Recall of Precautions/Restrictions: Impaired Restrictions Weight Bearing Restrictions Per Provider Order: No       Mobility Bed Mobility Overal bed mobility: Needs Assistance Bed Mobility: Supine to Sit, Sit to Supine     Supine to sit: Min assist Sit to supine: Supervision   General bed mobility comments: Min A to lift trunk    Transfers Overall transfer level: Needs assistance Equipment  used: Rolling walker (2 wheels) Transfers: Sit to/from Stand Sit to Stand: Min assist           General transfer comment: Min A to stand from bedside with RW. Pt able to stand briefly but d/t incoordination, unable to pivot to chair     Balance Overall balance assessment: Needs assistance Sitting-balance support: No upper extremity supported, Feet supported Sitting balance-Leahy Scale: Fair     Standing balance support: Bilateral upper extremity supported, During functional activity Standing balance-Leahy Scale: Poor                             ADL either performed or assessed with clinical judgement   ADL Overall ADL's : Needs assistance/impaired Eating/Feeding: Minimal assistance Eating/Feeding Details (indicate cue type and reason): CGA/hand over hand to assist holding on to cups and bringing to mouth d/t UE suddenly dropping             Upper Body Dressing : Minimal assistance                     General ADL Comments: Hoped to progress mobility and cognition with ADLs though pt limited by asterixis-type movements with BUE suddenly dropping and brief poor truncal support sitting EOB.    Extremity/Trunk Assessment Upper Extremity Assessment Upper Extremity Assessment: Generalized weakness;Right hand dominant;RUE deficits/detail;LUE deficits/detail RUE Deficits / Details: difficulty holding onto items, UE often suddenly dropping when attempting to bring cups to mouth, etc RUE Coordination: decreased fine motor LUE Deficits / Details: difficulty holding onto items, UE often suddenly dropping when attempting to bring cups to mouth, etc LUE Coordination: decreased fine motor   Lower Extremity Assessment Lower Extremity  Assessment: Defer to PT evaluation        Vision   Vision Assessment?: No apparent visual deficits   Perception     Praxis     Communication Communication Communication: Impaired Factors Affecting Communication: Reduced  clarity of speech   Cognition Arousal: Alert Behavior During Therapy: WFL for tasks assessed/performed, Flat affect Cognition: Cognition impaired   Orientation impairments: Time, Situation Awareness: Online awareness impaired Memory impairment (select all impairments): Short-term memory, Declarative long-term memory, Working memory Attention impairment (select first level of impairment): Sustained attention, Selective attention Executive functioning impairment (select all impairments): Sequencing, Reasoning, Problem solving, Organization OT - Cognition Comments: Pt aware of feeling mixed up today and yesterday and wondering why his cognition is altered. Aware of difficulty holding onto items and extremities suddenly dropping. remains confused overall with memory deficits. Per wife, pt normally cognitively intact                 Following commands: Impaired Following commands impaired: Follows one step commands with increased time      Cueing   Cueing Techniques: Verbal cues, Tactile cues, Gestural cues, Visual cues  Exercises      Shoulder Instructions       General Comments      Pertinent Vitals/ Pain       Pain Assessment Pain Assessment: No/denies pain  Home Living                                          Prior Functioning/Environment              Frequency  Min 2X/week        Progress Toward Goals  OT Goals(current goals can now be found in the care plan section)  Progress towards OT goals: OT to reassess next treatment  Acute Rehab OT Goals Patient Stated Goal: figure out what got me mixed up OT Goal Formulation: With patient/family Time For Goal Achievement: 06/21/24 Potential to Achieve Goals: Good ADL Goals Pt Will Perform Lower Body Bathing: with set-up;sitting/lateral leans;sit to/from stand Pt Will Perform Lower Body Dressing: with set-up;sit to/from stand;sitting/lateral leans Pt Will Transfer to Toilet: with  set-up;ambulating Pt Will Perform Toileting - Clothing Manipulation and hygiene: with set-up;sitting/lateral leans;sit to/from stand  Plan      Co-evaluation                 AM-PAC OT 6 Clicks Daily Activity     Outcome Measure   Help from another person eating meals?: A Little Help from another person taking care of personal grooming?: A Little Help from another person toileting, which includes using toliet, bedpan, or urinal?: A Little Help from another person bathing (including washing, rinsing, drying)?: A Little Help from another person to put on and taking off regular upper body clothing?: A Little Help from another person to put on and taking off regular lower body clothing?: A Little 6 Click Score: 18    End of Session Equipment Utilized During Treatment: Rolling walker (2 wheels);Gait belt  OT Visit Diagnosis: Unsteadiness on feet (R26.81);Other abnormalities of gait and mobility (R26.89);Muscle weakness (generalized) (M62.81);Other symptoms and signs involving cognitive function   Activity Tolerance Other (comment) (limited by asterixis type movements)   Patient Left in bed;with call bell/phone within reach;with bed alarm set;with nursing/sitter in room   Nurse Communication Mobility status (nursing present, aware of new changes and  talked to MD after session)        Time: 1020-1038 OT Time Calculation (min): 18 min  Charges: OT General Charges $OT Visit: 1 Visit OT Treatments $Therapeutic Activity: 8-22 mins  Mliss NOVAK, OTR/L Acute Rehab Services Office: [phone]   Mliss Fish 06/08/2024, 11:18 AM

## 2024-06-08 NOTE — Progress Notes (Signed)
 Initial Nutrition Assessment  DOCUMENTATION CODES:   Non-severe (moderate) malnutrition in context of chronic illness  INTERVENTION:  - 48 hour calorie count to start 1/12 and run through 1/14.  - Please document % intake of all foods, drinks, and nutrition supplements patient consumes on meal tickets and place in envelope on patient's door.  - If patient skips/refuses meals please document 0% for that meal.  - Heart healthy diet per MD.  - Ensure Plus High Protein po BID, each supplement provides 350 kcal and 20 grams of protein.  - Encourage intake at all meals and of supplements.   - Monitor weight trends.  NUTRITION DIAGNOSIS:   Moderate Malnutrition related to chronic illness as evidenced by mild fat depletion, severe muscle depletion. GOAL:   Patient will meet greater than or equal to 90% of their needs  MONITOR:   PO intake, Supplement acceptance, Weight trends  REASON FOR ASSESSMENT:   Consult Calorie Count  ASSESSMENT:   74 y.o. malewith PMH significant for atrial fibrillation, CHF, DM2, depression, HTN, HLD, and CVA who presented with altered mental status and admitted for sepsis/likely pneumonia.  Patient sleeping in bed at time of visit but awoke to sound of voice.   Patient noted to be admitted for AMS and remains confused. Unable to provide any nutrition history.   Per chart review, patient appears to have lost significant weight from May to September but gained weight back after that time. There had been no weight since admission so ordered a new weight this morning. Weight put in chart but is exactly the same as weight taken on 12/18 so suspect this was copied over.  Calorie count ordered yesterday however patient noted to be eating 100% of meals. Had 100% of all 3 meals yesterday and therefore consumed 1747 kcals and 85g of protein. Breakfast tray in patient's room at time of visit and 90% consumed (had 100% eggs, potatoes, and a banana and had about 75%  of grits).  Patient reports he has a good appetite. Encouraged patient to continue eating well at meals. He reports having tried Ensure before and enjoying it.   Medications reviewed and include: Protonix   Labs reviewed:  K+ 3.4 Creatinine 2.65 HA1C 9.4 (as of 1/13) Blood Glucose 55-238 x24 hours   NUTRITION - FOCUSED PHYSICAL EXAM:  Flowsheet Row Most Recent Value  Orbital Region Mild depletion  Upper Arm Region Moderate depletion  Thoracic and Lumbar Region Mild depletion  Buccal Region Mild depletion  [place] Region Severe depletion  Clavicle Bone Region Mild depletion  Clavicle and Acromion Bone Region Mild depletion  Scapular Bone Region Unable to assess  Dorsal Hand No depletion  Patellar Region Severe depletion  Anterior Thigh Region Severe depletion  Posterior Calf Region Moderate depletion  Edema (RD Assessment) None  Hair Reviewed  Eyes Reviewed  Mouth Reviewed  Skin Reviewed  Nails Reviewed    Diet Order:   Diet Order             Diet Heart Room service appropriate? Yes; Fluid consistency: Thin  Diet effective now                   EDUCATION NEEDS:  Not appropriate for education at this time  Skin:  Skin Assessment: Reviewed RN Assessment  Last BM:  1/11  Height:  Ht Readings from Last 1 Encounters:  06/06/24 6' 1 (1.854 m)   Weight:  Wt Readings from Last 1 Encounters:  06/05/24 94.3 kg   Ideal  Body Weight:  83.64 kg  BMI:  Body mass index is 27.43 kg/m.  Estimated Nutritional Needs:  Kcal:  1950-2250 kcals Protein:  85-105 grams Fluid:  >/= 2L    Trude Ned RD, LDN Contact via Secure Chat.

## 2024-06-09 DIAGNOSIS — A419 Sepsis, unspecified organism: Secondary | ICD-10-CM | POA: Diagnosis not present

## 2024-06-09 DIAGNOSIS — R6520 Severe sepsis without septic shock: Secondary | ICD-10-CM | POA: Diagnosis not present

## 2024-06-09 LAB — CBC
HCT: 37.7 % — ABNORMAL LOW (ref 39.0–52.0)
Hemoglobin: 12.7 g/dL — ABNORMAL LOW (ref 13.0–17.0)
MCH: 29.9 pg (ref 26.0–34.0)
MCHC: 33.7 g/dL (ref 30.0–36.0)
MCV: 88.7 fL (ref 80.0–100.0)
Platelets: 63 K/uL — ABNORMAL LOW (ref 150–400)
RBC: 4.25 MIL/uL (ref 4.22–5.81)
RDW: 14.5 % (ref 11.5–15.5)
WBC: 5.1 K/uL (ref 4.0–10.5)
nRBC: 0 % (ref 0.0–0.2)

## 2024-06-09 LAB — BASIC METABOLIC PANEL WITH GFR
Anion gap: 10 (ref 5–15)
BUN: 44 mg/dL — ABNORMAL HIGH (ref 8–23)
CO2: 23 mmol/L (ref 22–32)
Calcium: 8.7 mg/dL — ABNORMAL LOW (ref 8.9–10.3)
Chloride: 110 mmol/L (ref 98–111)
Creatinine, Ser: 2.7 mg/dL — ABNORMAL HIGH (ref 0.61–1.24)
GFR, Estimated: 24 mL/min — ABNORMAL LOW
Glucose, Bld: 202 mg/dL — ABNORMAL HIGH (ref 70–99)
Potassium: 3.8 mmol/L (ref 3.5–5.1)
Sodium: 143 mmol/L (ref 135–145)

## 2024-06-09 LAB — MAGNESIUM: Magnesium: 1.9 mg/dL (ref 1.7–2.4)

## 2024-06-09 LAB — GLUCOSE, CAPILLARY
Glucose-Capillary: 197 mg/dL — ABNORMAL HIGH (ref 70–99)
Glucose-Capillary: 283 mg/dL — ABNORMAL HIGH (ref 70–99)
Glucose-Capillary: 380 mg/dL — ABNORMAL HIGH (ref 70–99)
Glucose-Capillary: 153 mg/dL — ABNORMAL HIGH (ref 70–99)

## 2024-06-09 MED ORDER — QUETIAPINE FUMARATE 25 MG PO TABS
12.5000 mg | ORAL_TABLET | Freq: Every day | ORAL | Status: DC
  Administered 2024-06-09 – 2024-06-13 (×5): 12.5 mg via ORAL
  Filled 2024-06-09 (×5): qty 1

## 2024-06-09 MED ORDER — DOXYCYCLINE HYCLATE 100 MG PO TABS
100.0000 mg | ORAL_TABLET | Freq: Two times a day (BID) | ORAL | Status: AC
  Administered 2024-06-09 – 2024-06-12 (×8): 100 mg via ORAL
  Filled 2024-06-09 (×8): qty 1

## 2024-06-09 NOTE — TOC Initial Note (Addendum)
 Transition of Care [facility]) - Initial/Assessment Note    Patient Details  Name: Anthony [name] MRN: [id number] Date of Birth: 1951-05-18  [facility]) CM/SW Contact:    Alfonse JONELLE Rex, RN Phone Number: 06/09/2024, 11:30 AM  Clinical Narrative:  Admitted from home , presented to ED with altered mental status, c/o dysuria and weakness. Resides in a private residence with his spouse who is is primary caregiver,  PCP and insurance on file. Home equipment : RW , BSC. PT eval completed, recommendation for short term rehab/SNF. Call to patient's spouse to introduce role of INPT CM and review for dc planning, spouse agreeable with short term rehab,  prefers [facility], states patient has been there before and did well. FL2 updated.     -12:45pm Call to Kia, admit coordinator w/[facility], would like to make bed offer but facility full at this time. NCM will keep Kia updated on pt's progress for bed availability.    Expected Discharge Plan: Skilled Nursing Facility     Patient Goals and CMS Choice            Expected Discharge Plan and Services                                              Prior Living Arrangements/Services                       Activities of Daily Living   ADL Screening (condition at time of admission) Independently performs ADLs?: No Does the patient have a NEW difficulty with bathing/dressing/toileting/self-feeding that is expected to last >3 days?: Yes (Initiates electronic notice to provider for possible OT consult) Does the patient have a NEW difficulty with getting in/out of bed, walking, or climbing stairs that is expected to last >3 days?: Yes (Initiates electronic notice to provider for possible PT consult) Does the patient have a NEW difficulty with communication that is expected to last >3 days?: No Is the patient deaf or have difficulty hearing?: No Does the patient have difficulty seeing, even when wearing  glasses/contacts?: No Does the patient have difficulty concentrating, remembering, or making decisions?: No  Permission Sought/Granted                  Emotional Assessment              Admission diagnosis:  Essential hypertension, benign [I10] Chronic kidney disease, stage IV (severe) (HCC) [N18.4] Hyperglycemia [R73.9] Permanent atrial fibrillation (HCC) [I48.21] Increased anion gap metabolic acidosis [E87.29] Atrial fibrillation, permanent (HCC) [I48.21] Sepsis (HCC) [A41.9] Chronic kidney disease, stage 4 (severe) (HCC) [N18.4] Altered mental status, unspecified altered mental status type [R41.82] Other cardiomyopathies (HCC) [I42.8] Community acquired pneumonia, unspecified laterality [J18.9] Patient Active Problem List   Diagnosis Date Noted   Malnutrition of moderate degree 06/08/2024   Sepsis (HCC) 06/05/2024   Anemia 02/16/2024   Scalp hematoma 02/06/2024   Rib fractures 02/06/2024   Syncope 02/06/2024   Chronic back pain 01/26/2024   Peripheral neuropathy 10/11/2022   Urinary urgency 06/18/2022   CVA (cerebral vascular accident) (HCC) 02/20/2022   Low back pain with right-sided sciatica 01/07/2022   BPH (benign prostatic hyperplasia) 08/12/2021   Hip weakness 02/27/2021   Left leg weakness 02/17/2021   Neck mass 11/16/2020   Erectile dysfunction 05/31/2020   Polypharmacy 05/22/2020   Porokeratosis 04/26/2020  Other biomechanical lesions of lumbar region 12/07/2019   Neck muscle strain 11/22/2019   Hx of adenomatous colonic polyps    S/P ICD (internal cardiac defibrillator) Medtronic 07/26/2017   NICM (nonischemic cardiomyopathy) (HCC) 07/25/2017   Congestive heart failure, NYHA class 3, chronic, systolic (HCC) 07/25/2017   Atrial fibrillation, permanent (HCC) 07/25/2017   Memory change    Thrombocytopenia    Right temporal lobe infarction (HCC) 06/13/2017   Seizures (HCC) 06/12/2017   Chronic kidney disease (CKD), stage IV (severe) (HCC)  01/22/2017   Secondary hyperparathyroidism of renal origin 01/22/2017   Chronic anticoagulation 03/06/2016   Chronic, continuous use of opioids 10/18/2014   OSA on CPAP 04/04/2014   History of tobacco abuse 06/28/2013   DIABETIC  RETINOPATHY 02/24/2008   Insulin  dependent type 2 diabetes mellitus (HCC) 07/24/2006   HYPERCHOLESTEROLEMIA 07/24/2006   Depression 07/24/2006   HTN (hypertension) 07/24/2006   Coronary atherosclerosis 07/24/2006   GASTROESOPHAGEAL REFLUX, NO ESOPHAGITIS 07/24/2006   PCP:  Madelon Donald HERO, DO Pharmacy:   [phone] - [place], [place] - [address] AT [facility] [address] [place] [address] Phone: [phone] Fax: [phone]  [facility] [id number] - [place], [place] - [address] [place] [address] Phone: [phone] Fax: [phone]  [facility] [address] [place] [address] Phone: [phone] Fax: [phone]     Social Drivers of Health (SDOH) Social History: SDOH Screenings   Food Insecurity: No Food Insecurity (06/06/2024)  Housing: Low Risk (06/06/2024)  Transportation Needs: No Transportation Needs (06/06/2024)  Utilities: Not At Risk (06/06/2024)  Alcohol Screen: Low Risk (08/21/2023)  Depression (PHQ2-9): Low Risk (04/16/2024)  Financial Resource Strain: Low Risk (08/21/2023)  Physical Activity: Sufficiently Active (08/21/2023)  Social Connections: Socially Integrated (06/06/2024)  Stress: No Stress Concern Present (08/21/2023)  Tobacco Use: Medium Risk (06/08/2024)  Health Literacy: Adequate Health Literacy (08/21/2023)   SDOH Interventions:     Readmission Risk Interventions    06/09/2024   11:19 AM 02/09/2024   10:15 AM  Readmission Risk Prevention Plan  Transportation Screening Complete Complete  PCP or Specialist Appt within 3-5 Days  Complete  HRI or Home Care Consult  Complete  Social Work  Consult for Recovery Care Planning/Counseling  Complete  Palliative Care Screening  Complete  Medication Review [facility]) Complete Referral to Pharmacy  PCP or Specialist appointment within 3-5 days of discharge Complete   SW Recovery Care/Counseling Consult Complete   Palliative Care Screening Not Applicable   Skilled Nursing Facility Complete

## 2024-06-09 NOTE — Progress Notes (Signed)
 Anthony  TURON [name] FMW:[id number]  DOB: 09/30/1950  DOA: 06/05/2024  PCP: Madelon Donald HERO, DO  06/09/2024,9:59 AM  LOS: 4 days    Code Status: Full code     from: Home   74 year old black male  intracranial related left-sided weakness not TIA 2022--- prior acute right temporal stroke 05/2017 DMT by 2 with history of honk Chronic low back pain Atrial fibrillation with previous AV ablation for LV dysfunction/Medtronic BiV ICD placed--last echo EF 5060-65% mild LVH pacing wires RA/RV Fall to floor with rib fractures 01/2024 admission with scalp hematoma  06/05/2024 presented from home with altered mental status burning in the urine confused-wife reported decreased p.o. not eating drinking not checking home sugars sometimes with trouble getting words out-in ED found to have AKI with bun/cret 45/2.58, Gap 26-Co2 19--and felt to have DKA CT head on arrival no acute findings chest x-ray negative CT chest abdomen pelvis patchy ground glass opacities RLL?  Infectious inflammatory process severe atherosclerosis of abdominal aorta with 4 vessel coronary calcifications    Assessment  & Plan :    Toxic septic encephalopathy on admission related to DKA, pneumonia and is on oxycodone , gabapentin  and baclofen Mentation seems to have normalized-currently holding all opiate analgesia and try to minimize other meds as able  Likely aspiration pneumonia causing sepsis on arrival Continues on cefepime -DC vancomycin  as MRSA is negative in nares Monitor trend  a fever over the next several days Transition to oral antibiotic in the next several days  DKA on admission with lactic acidosis Home meds Tresiba  28 units, 6 to 10 units twice daily meal DKA physiology is resolved-sugars have been labile Continues on sliding scale insulin -sensitive coverage and 10 units of long-acting insulin  daily Gabapentin  continues at 300 every 8 (home dose 200 every 8)  AKI superimposed on CKD 3 A Holding home Lasix  60 Hyzaar  50-12.5--- losartan  50 here has been stopped We will BladderScan the patient as he may have prostatism additionally Continue at this time Flomax  0.4 daily  Subacute thrombocytopenia Unclear if concern for HIT Platelets already resolving-morphology seems normal- Would hold workup at this time  A-fib slow RVR Medtronic PPM in place Continue Coreg  25 twice daily, Eliquis  5 twice daily other meds all held as above  Stroke 2019, ?  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 Continues Aricept  5 at bedtime Paxil  30-would cut Seroquel  back to 12.5 at bedtime It seems he needs reorientation and hopefully we can get rid of sitter soon  Data Reviewed today: Sodium 143 potassium 3.8 BUN/creatinine 44/2.7 WBC 5.1 hemoglobin 12.7 platelets 63   DVT prophylaxis: Apixaban    Dispo/Global plan: Inpatient     Subjective:   Awake coherent can tell me month year season [place] but gets confused with regards to the day Can name his wife No overt fever or chills needed a little help based on PT OT note for follow-up with rehab at discharge  Objective + exam Vitals:   06/08/24 1208 06/08/24 2023 06/08/24 2252 06/09/24 0558  BP: (!) 153/77 (!) 160/93 (!) 157/80 (!) 169/106  Pulse: 69 68 70 70  Resp: 18 20 16 17   Temp: 97.9 F (36.6 C) 98.5 F (36.9 C) 98.8 F (37.1 C) 98.2 F (36.8 C)  TempSrc: Oral Oral Oral Oral  SpO2: 100% 97% 100% 97%  Weight:      Height:       Filed Weights   06/05/24 2200  Weight: 94.3 kg     Examination: EOMI NCAT no  focal deficit no icterus no pallor-not overtly confused-able to follow commands and have a discussion--CTAB no added sound--S1-S2 no murmur--abdomen soft no rebound no guarding no wheeze no rales no rhonchi--ROM intact--power 5/5 reflexes 2/3 psych euthymic coherent  Scheduled Meds:  apixaban   5 mg Oral BID   carvedilol   25 mg Oral BID   donepezil   5 mg Oral QHS   dorzolamide -timolol   1 drop Both Eyes BID   feeding supplement  237 mL Oral  BID BM   gabapentin   300 mg Oral Q8H   insulin  aspart  0-9 Units Subcutaneous TID WC   insulin  glargine  10 Units Subcutaneous Daily   latanoprost   1 drop Both Eyes QHS   pantoprazole   40 mg Oral Daily   PARoxetine   30 mg Oral Daily   QUEtiapine   12.5 mg Oral QHS   rosuvastatin   20 mg Oral Daily   tamsulosin   0.4 mg Oral Daily   Continuous Infusions:   acetaminophen  **OR** acetaminophen , hydrALAZINE , linaclotide , ondansetron  **OR** ondansetron  (ZOFRAN ) IV  Time 46  Colen Grimes, MD  [facility]

## 2024-06-09 NOTE — Progress Notes (Signed)
 Calorie Count Note  48 hour calorie count ordered.  Diet: Heart healthy Supplements: Ensure Plus High Protein BID  Day 1 (1/12): Breakfast: 100% Lunch: 100% Dinner: 100% Supplements: -  Total intake: 1747 kcal (90% of minimum estimated needs)  85 protein (100% of minimum estimated needs)  Day 2 (1/13): Breakfast: 100% eggs, 100% potatoes, 100% banana and had about 75% of grits  Lunch: Nothing documented to assess intake Dinner: 100% Supplements: EPHP x1 = 350 kcals, 20g protein  Total intake: 1343 kcal (69% of minimum estimated needs)  67 protein (79% of minimum estimated needs)  Nutrition Dx: Moderate Malnutrition related to chronic illness as evidenced by mild fat depletion, severe muscle depletion.   Goal: Patient will meet greater than or equal to 90% of their needs   Subjective: Patient much more alert and oriented at time of visit this afternoon. Reports he has been eating 3 meals a day and continues to have good appetite. Shares it takes him a long time to eat because of his dentition but he has been doing well with the automatic trays he has been getting.  Patient noted to have consumed at least 69% kcal and 79% of protein needs yesterday. This does not include lunch because it was not documented so suspect patient ate even more yesterday. He is eating sufficiently, will discontinue calorie count.  Intervention:  - Discontinue calorie count, patient eating sufficiently. - Heart healthy diet.  - Ensure Plus High Protein po BID, each supplement provides 350 kcal and 20 grams of protein.  Trude Ned RD, LDN Contact via [employer].

## 2024-06-09 NOTE — [place] FL2 (Signed)
 " [place]  MEDICAID FL2 LEVEL OF CARE FORM     IDENTIFICATION  Patient Name: Anthony [name] Birthdate: 23-Sep-1950 Sex: male Admission Date (Current Location): 06/05/2024  [facility] and [place] Number:  Producer, Television/film/video and Address:  [facility],  [address] [place], [place] [address]      Provider Number: [id number]  Attending Physician Name and Address:  Royal Sill, MD  Relative Name and Phone Number:  Sekou, Zuckerman, Emergency Contact  [phone] [facility] Phone)    Current Level of Care: Hospital Recommended Level of Care: Skilled Nursing Facility Prior Approval Number:    Date Approved/Denied:   PASRR Number: [id number]  Discharge Plan: SNF    Current Diagnoses: Patient Active Problem List   Diagnosis Date Noted   Malnutrition of moderate degree 06/08/2024   Sepsis (HCC) 06/05/2024   Anemia 02/16/2024   Scalp hematoma 02/06/2024   Rib fractures 02/06/2024   Syncope 02/06/2024   Chronic back pain 01/26/2024   Peripheral neuropathy 10/11/2022   Urinary urgency 06/18/2022   CVA (cerebral vascular accident) (HCC) 02/20/2022   Low back pain with right-sided sciatica 01/07/2022   BPH (benign prostatic hyperplasia) 08/12/2021   Hip weakness 02/27/2021   Left leg weakness 02/17/2021   Neck mass 11/16/2020   Erectile dysfunction 05/31/2020   Polypharmacy 05/22/2020   Porokeratosis 04/26/2020   Other biomechanical lesions of lumbar region 12/07/2019   Neck muscle strain 11/22/2019   Hx of adenomatous colonic polyps    S/P ICD (internal cardiac defibrillator) Medtronic 07/26/2017   NICM (nonischemic cardiomyopathy) (HCC) 07/25/2017   Congestive heart failure, NYHA class 3, chronic, systolic (HCC) 07/25/2017   Atrial fibrillation, permanent (HCC) 07/25/2017   Memory change    Thrombocytopenia    Right temporal lobe infarction (HCC) 06/13/2017   Seizures (HCC) 06/12/2017   Chronic kidney disease (CKD), stage IV (severe) (HCC) 01/22/2017    Secondary hyperparathyroidism of renal origin 01/22/2017   Chronic anticoagulation 03/06/2016   Chronic, continuous use of opioids 10/18/2014   OSA on CPAP 04/04/2014   History of tobacco abuse 06/28/2013   DIABETIC  RETINOPATHY 02/24/2008   Insulin  dependent type 2 diabetes mellitus (HCC) 07/24/2006   HYPERCHOLESTEROLEMIA 07/24/2006   Depression 07/24/2006   HTN (hypertension) 07/24/2006   Coronary atherosclerosis 07/24/2006   GASTROESOPHAGEAL REFLUX, NO ESOPHAGITIS 07/24/2006    Orientation RESPIRATION BLADDER Height & Weight     Self, Place  Normal Continent Weight: 94.3 kg Height:  6' 1 (185.4 cm)  BEHAVIORAL SYMPTOMS/MOOD NEUROLOGICAL BOWEL NUTRITION STATUS      Continent Diet (Heart diet)  AMBULATORY STATUS COMMUNICATION OF NEEDS Skin   Limited Assist Verbally Normal                       Personal Care Assistance Level of Assistance  Bathing, Feeding, Dressing Bathing Assistance: Limited assistance Feeding assistance: Limited assistance Dressing Assistance: Limited assistance     Functional Limitations Info  Sight, Hearing, Speech Sight Info: Adequate Hearing Info: Adequate Speech Info: Adequate    SPECIAL CARE FACTORS FREQUENCY  PT (By licensed PT), OT (By licensed OT)     PT Frequency: 5x/wk OT Frequency: 5x/wk            Contractures Contractures Info: Not present    Additional Factors Info  Code Status, Allergies, Psychotropic Code Status Info: Full  Code Allergies Info: Enalapril Maleate Psychotropic Info: Paxil  30mg  po daily; Seroquel  12.5mg  po QHS         Current  Medications (06/09/2024):  This is the current hospital active medication list Current Facility-Administered Medications  Medication Dose Route Frequency Provider Last Rate Last Admin   acetaminophen  (TYLENOL ) tablet 650 mg  650 mg Oral Q6H PRN Dorrell, Yasmin, MD       Or   acetaminophen  (TYLENOL ) suppository 650 mg  650 mg Rectal Q6H PRN Dorrell, Lundy, MD        apixaban  (ELIQUIS ) tablet 5 mg  5 mg Oral BID Dena Charleston, MD   5 mg at 06/09/24 1015   carvedilol  (COREG ) tablet 25 mg  25 mg Oral BID Dorrell, Chaim, MD   25 mg at 06/09/24 1015   donepezil  (ARICEPT ) tablet 5 mg  5 mg Oral QHS Alto Isaiah CROME, NP   5 mg at 06/08/24 2118   dorzolamide -timolol  (COSOPT ) 2-0.5 % ophthalmic solution 1 drop  1 drop Both Eyes BID Dorrell, Ellis, MD   1 drop at 06/09/24 1025   doxycycline  (VIBRA -TABS) tablet 100 mg  100 mg Oral Q12H Samtani, Jai-Gurmukh, MD       feeding supplement (ENSURE PLUS HIGH PROTEIN) liquid 237 mL  237 mL Oral BID BM Paudel, Keshab, MD   237 mL at 06/09/24 1017   gabapentin  (NEURONTIN ) capsule 300 mg  300 mg Oral Q8H Dorrell, Charleston, MD   300 mg at 06/09/24 9392   hydrALAZINE  (APRESOLINE ) tablet 25 mg  25 mg Oral Q6H PRN Rosario Eland I, MD       insulin  aspart (novoLOG ) injection 0-9 Units  0-9 Units Subcutaneous TID WC Rosario Eland I, MD   2 Units at 06/09/24 0848   insulin  glargine (LANTUS ) injection 10 Units  10 Units Subcutaneous Daily Rosario Eland I, MD   10 Units at 06/09/24 1016   latanoprost  (XALATAN ) 0.005 % ophthalmic solution 1 drop  1 drop Both Eyes QHS Dorrell, Nickolas, MD   1 drop at 06/08/24 2123   linaclotide  (LINZESS ) capsule 145 mcg  145 mcg Oral Daily PRN Rosario Eland I, MD       ondansetron  (ZOFRAN ) tablet 4 mg  4 mg Oral Q6H PRN Dena Charleston, MD       Or   ondansetron  (ZOFRAN ) injection 4 mg  4 mg Intravenous Q6H PRN Dorrell, Keevan, MD       pantoprazole  (PROTONIX ) EC tablet 40 mg  40 mg Oral Daily Dorrell, Charleston, MD   40 mg at 06/09/24 1015   PARoxetine  (PAXIL ) tablet 30 mg  30 mg Oral Daily Dorrell, Charleston, MD   30 mg at 06/09/24 1014   QUEtiapine  (SEROQUEL ) tablet 12.5 mg  12.5 mg Oral QHS Samtani, Jai-Gurmukh, MD       rosuvastatin  (CRESTOR ) tablet 20 mg  20 mg Oral Daily Dorrell, Bryen, MD   20 mg at 06/09/24 1014   tamsulosin  (FLOMAX ) capsule 0.4 mg  0.4 mg Oral Daily Dena Charleston, MD    0.4 mg at 06/09/24 1015     Discharge Medications: Please see discharge summary for a list of discharge medications.  Relevant Imaging Results:  Relevant Lab Results:   Additional Information SSN: [id number]  Alfonse JONELLE Rex, RN     "

## 2024-06-09 NOTE — Progress Notes (Signed)
 Safety Sitter was at bedside. Will not clear

## 2024-06-09 NOTE — Progress Notes (Signed)
 Physical Therapy Treatment Patient Details Name: Anthony [name] MRN: [id number] DOB: [birth year] Today's Date: 06/09/2024   History of Present Illness Anthony [name] is a 74 y.o. male who presents emergency department altered mental status.  Patient has been complaining of dysuria and weakness. Patient is currently being managed for sepsis/likely pneumonia.  PMH: diabetes mellitus, HTN, HLD, GERD, CHF status post ICD, CAD, CKD, chronic atrial fibrillation, CVA, DM 2    PT Comments  Pt agreeable to therapy, cognitively repeating questions, needing redirection to task at hand, but is oriented to self and location. Pt needing min A with ambulation with RW, unsteady gait, cues for widening BOS and increased foot clearance, assist to maintain body within RW frame as pt ambulating behind it with straight line gait and stepping outside of it with turns requiring assist to regain balance and proper positioning. Pt's breakfast tray present, session ended with pt seated in recliner, chair alarm on and call bell in lap.  Noted spouse reports 9 steps to safely enter/exit home and pt currently a high fall risk requiring min A for straightline, level gait and max verbal cues for safety and mobility with RW at this time. Updated d/c rec to continued inpatient follow up therapy, <3 hours/day prior to return home with spouse support.   If plan is discharge home, recommend the following: A little help with walking and/or transfers;A little help with bathing/dressing/bathroom;Assistance with cooking/housework;Assist for transportation;Help with stairs or ramp for entrance   Can travel by private vehicle     Yes  Equipment Recommendations  None recommended by PT    Recommendations for Other Services       Precautions / Restrictions Precautions Precautions: Fall Recall of Precautions/Restrictions: Impaired Restrictions Weight Bearing Restrictions Per Provider Order: No     Mobility  Bed Mobility Overal  bed mobility: Needs Assistance Bed Mobility: Supine to Sit     Supine to sit: Min assist, Used rails, HOB elevated     General bed mobility comments: min A to upright trunk and mobilize to bedside, using bedrails and elevated HOB to assit as able    Transfers Overall transfer level: Needs assistance Equipment used: Rolling walker (2 wheels) Transfers: Sit to/from Stand Sit to Stand: Contact guard assist, Min assist           General transfer comment: cues for hand placement, BUE assisting to power up from bedside and recliner, BLE braced against seated surface    Ambulation/Gait Ambulation/Gait assistance: Min assist Gait Distance (Feet): 25 Feet Assistive device: Rolling walker (2 wheels) Gait Pattern/deviations: Decreased stride length, Shuffle, Trunk flexed, Narrow base of support, Decreased step length - left, Decreased stance time - right Gait velocity: decreased     General Gait Details: decreased bil step length, significantly decreased cadence, shuffling foot clearance, trunk forward flexed wtih narrow BOS, min A to steady and assist with RW management, increased time and steps and cues with turns as pt stepping outside RW frame and unsteady   Stairs             Wheelchair Mobility     Tilt Bed    Modified Rankin (Stroke Patients Only)       Balance Overall balance assessment: Needs assistance Sitting-balance support: No upper extremity supported, Feet supported Sitting balance-Leahy Scale: Fair Sitting balance - Comments: not challenged   Standing balance support: Bilateral upper extremity supported, During functional activity Standing balance-Leahy Scale: Poor Standing balance comment: min A with RW  Communication Communication Communication: Impaired Factors Affecting Communication: Reduced clarity of speech  Cognition Arousal: Alert Behavior During Therapy: WFL for tasks assessed/performed   PT -  Cognitive impairments: No family/caregiver present to determine baseline                       PT - Cognition Comments: pt able to state name and hospial correctly, slow to respond to questions, repeats questions, mumbled speech at times Following commands: Impaired Following commands impaired: Follows one step commands with increased time    Cueing Cueing Techniques: Verbal cues, Tactile cues, Gestural cues, Visual cues  Exercises      General Comments        Pertinent Vitals/Pain Pain Assessment Pain Assessment: No/denies pain    Home Living                          Prior Function            PT Goals (current goals can now be found in the care plan section) Acute Rehab PT Goals Patient Stated Goal: return home PT Goal Formulation: With patient Time For Goal Achievement: 06/21/24 Potential to Achieve Goals: Good Progress towards PT goals: Progressing toward goals    Frequency    Min 3X/week      PT Plan      Co-evaluation              AM-PAC PT 6 Clicks Mobility   Outcome Measure  Help needed turning from your back to your side while in a flat bed without using bedrails?: A Little Help needed moving from lying on your back to sitting on the side of a flat bed without using bedrails?: A Little Help needed moving to and from a bed to a chair (including a wheelchair)?: A Little Help needed standing up from a chair using your arms (e.g., wheelchair or bedside chair)?: A Little Help needed to walk in hospital room?: A Little Help needed climbing 3-5 steps with a railing? : A Lot 6 Click Score: 17    End of Session Equipment Utilized During Treatment: Gait belt Activity Tolerance: Patient tolerated treatment well Patient left: in chair;with call bell/phone within reach;with chair alarm set Nurse Communication: Mobility status PT Visit Diagnosis: Unsteadiness on feet (R26.81);Muscle weakness (generalized) (M62.81);Difficulty in  walking, not elsewhere classified (R26.2)     Time: 9070-9053 PT Time Calculation (min) (ACUTE ONLY): 17 min  Charges:    $Gait Training: 8-22 mins PT General Charges $$ ACUTE PT VISIT: 1 Visit                     Tori Williemae Muriel PT, DPT 06/09/2024, 10:27 AM

## 2024-06-10 DIAGNOSIS — R6520 Severe sepsis without septic shock: Secondary | ICD-10-CM | POA: Diagnosis not present

## 2024-06-10 DIAGNOSIS — A419 Sepsis, unspecified organism: Secondary | ICD-10-CM | POA: Diagnosis not present

## 2024-06-10 LAB — CBC WITH DIFFERENTIAL/PLATELET
Abs Immature Granulocytes: 0.02 K/uL (ref 0.00–0.07)
Basophils Absolute: 0 K/uL (ref 0.0–0.1)
Basophils Relative: 0 %
Eosinophils Absolute: 0.1 K/uL (ref 0.0–0.5)
Eosinophils Relative: 2 %
HCT: 38.6 % — ABNORMAL LOW (ref 39.0–52.0)
Hemoglobin: 13 g/dL (ref 13.0–17.0)
Immature Granulocytes: 0 %
Lymphocytes Relative: 21 %
Lymphs Abs: 1.2 K/uL (ref 0.7–4.0)
MCH: 29.7 pg (ref 26.0–34.0)
MCHC: 33.7 g/dL (ref 30.0–36.0)
MCV: 88.1 fL (ref 80.0–100.0)
Monocytes Absolute: 0.7 K/uL (ref 0.1–1.0)
Monocytes Relative: 11 %
Neutro Abs: 3.9 K/uL (ref 1.7–7.7)
Neutrophils Relative %: 66 %
Platelets: 80 K/uL — ABNORMAL LOW (ref 150–400)
RBC: 4.38 MIL/uL (ref 4.22–5.81)
RDW: 14.3 % (ref 11.5–15.5)
Smear Review: NORMAL
WBC: 5.9 K/uL (ref 4.0–10.5)
nRBC: 0 % (ref 0.0–0.2)

## 2024-06-10 LAB — BASIC METABOLIC PANEL WITH GFR
Anion gap: 9 (ref 5–15)
BUN: 40 mg/dL — ABNORMAL HIGH (ref 8–23)
CO2: 22 mmol/L (ref 22–32)
Calcium: 8.8 mg/dL — ABNORMAL LOW (ref 8.9–10.3)
Chloride: 111 mmol/L (ref 98–111)
Creatinine, Ser: 2.38 mg/dL — ABNORMAL HIGH (ref 0.61–1.24)
GFR, Estimated: 28 mL/min — ABNORMAL LOW
Glucose, Bld: 230 mg/dL — ABNORMAL HIGH (ref 70–99)
Potassium: 3.4 mmol/L — ABNORMAL LOW (ref 3.5–5.1)
Sodium: 142 mmol/L (ref 135–145)

## 2024-06-10 LAB — GLUCOSE, CAPILLARY
Glucose-Capillary: 186 mg/dL — ABNORMAL HIGH (ref 70–99)
Glucose-Capillary: 400 mg/dL — ABNORMAL HIGH (ref 70–99)
Glucose-Capillary: 162 mg/dL — ABNORMAL HIGH (ref 70–99)
Glucose-Capillary: 159 mg/dL — ABNORMAL HIGH (ref 70–99)

## 2024-06-10 MED ORDER — INSULIN ASPART 100 UNIT/ML IJ SOLN
0.0000 [IU] | Freq: Three times a day (TID) | INTRAMUSCULAR | Status: DC
  Administered 2024-06-10: 2 [IU] via SUBCUTANEOUS
  Administered 2024-06-11: 5 [IU] via SUBCUTANEOUS
  Administered 2024-06-11: 2 [IU] via SUBCUTANEOUS
  Administered 2024-06-11: 5 [IU] via SUBCUTANEOUS
  Administered 2024-06-12: 3 [IU] via SUBCUTANEOUS
  Administered 2024-06-12: 2 [IU] via SUBCUTANEOUS
  Administered 2024-06-12 – 2024-06-14 (×5): 3 [IU] via SUBCUTANEOUS
  Administered 2024-06-14: 5 [IU] via SUBCUTANEOUS
  Filled 2024-06-10 (×2): qty 3
  Filled 2024-06-10: qty 5
  Filled 2024-06-10: qty 2
  Filled 2024-06-10 (×2): qty 3
  Filled 2024-06-10: qty 2
  Filled 2024-06-10: qty 3
  Filled 2024-06-10: qty 2
  Filled 2024-06-10: qty 3
  Filled 2024-06-10 (×2): qty 5

## 2024-06-10 MED ORDER — INSULIN ASPART 100 UNIT/ML IJ SOLN
5.0000 [IU] | Freq: Once | INTRAMUSCULAR | Status: AC
  Administered 2024-06-10: 5 [IU] via SUBCUTANEOUS
  Filled 2024-06-10: qty 5

## 2024-06-10 MED ORDER — LOSARTAN POTASSIUM 25 MG PO TABS
25.0000 mg | ORAL_TABLET | Freq: Every day | ORAL | Status: DC
  Administered 2024-06-10 – 2024-06-14 (×5): 25 mg via ORAL
  Filled 2024-06-10 (×5): qty 1

## 2024-06-10 MED ORDER — CHLORHEXIDINE GLUCONATE CLOTH 2 % EX PADS
6.0000 | MEDICATED_PAD | Freq: Every day | CUTANEOUS | Status: DC
  Administered 2024-06-10 – 2024-06-14 (×5): 6 via TOPICAL

## 2024-06-10 MED ORDER — INSULIN GLARGINE-YFGN 100 UNIT/ML ~~LOC~~ SOLN
10.0000 [IU] | Freq: Every day | SUBCUTANEOUS | Status: DC
  Administered 2024-06-10 – 2024-06-13 (×4): 10 [IU] via SUBCUTANEOUS
  Filled 2024-06-10 (×5): qty 0.1

## 2024-06-10 MED ORDER — INSULIN ASPART 100 UNIT/ML IJ SOLN
2.0000 [IU] | Freq: Three times a day (TID) | INTRAMUSCULAR | Status: DC
  Administered 2024-06-10 – 2024-06-14 (×11): 2 [IU] via SUBCUTANEOUS
  Filled 2024-06-10 (×12): qty 2

## 2024-06-10 NOTE — Progress Notes (Signed)
 Occupational Therapy Treatment Patient Details Name: Anthony [name] MRN: [id number] DOB: 08/17/1950 Today's Date: 06/10/2024   History of present illness Anthony [name] is a 74 yr old male who presented to the emergency department with altered mental status.  Patient has been complaining of dysuria and weakness. Patient is currently being managed for sepsis/likely pneumonia.  PMH: diabetes mellitus, HTN, HLD, GERD, CHF status post ICD, CAD, CKD, chronic atrial fibrillation, CVA, DM 2   OT comments  The pt was seen for ADL instruction, functional strengthening and progression of functional activity. He required min assist for sit to stand using a RW, CGA for short distance ambulation in his room using a RW, and min assist for lower body dressing. He required intermittent redirection to tasks and cues for general safety awareness, as he could distractible and with decreased insight. For instance, the pt was noted to hurriedly stop ambulating, in order to pick up a shoe on the floor he saw in the corner; he needed min assist for balance & to prevent a potential fall. He was also noted to be with disorientation to place and time; he reported the date to be August 1970. He did present with good effort and participation throughout.  Continue OT plan of care to maximize his safety and independence with ADLs. Patient will benefit from continued inpatient follow up therapy, <3 hours/day.       If plan is discharge home, recommend the following:  A little help with bathing/dressing/bathroom;Direct supervision/assist for financial management;Direct supervision/assist for medications management;Assist for transportation;Help with stairs or ramp for entrance;A little help with walking and/or transfers   Equipment Recommendations  Other (comment) (defer to next setting)    Recommendations for Other Services      Precautions / Restrictions Precautions Precautions: Fall Restrictions Weight Bearing  Restrictions Per Provider Order: No       Mobility Bed Mobility      General bed mobility comments: pt was received seated in the chair    Transfers Overall transfer level: Needs assistance Equipment used: Rolling walker (2 wheels) Transfers: Sit to/from Stand Sit to Stand: Min assist           General transfer comment: cues for hand placement, BUE assisting to power up from bedside and recliner     Balance       Sitting balance - Comments: static sitting-good. dynamic sitting-fair       Standing balance comment: Min assist with RW         ADL either performed or assessed with clinical judgement   ADL Overall ADL's : Needs assistance/impaired Eating/Feeding: Set up;Sitting   Grooming: Set up;Supervision/safety;Sitting Grooming Details (indicate cue type and reason): The pt performed upper body grooming seated in the bedside chair. He required occasional verbal cues for short-term memory/recall as well as for sustained attention to tasks as he could distractible.             Lower Body Dressing: Minimal assistance;Sitting/lateral leans Lower Body Dressing Details (indicate cue type and reason): The pt implemented the figure 4 technique to doff then donn his socks seated in the chair. He required slightly increased time and effort for task.                     Vision Baseline Vision/History: 1 Wears glasses               Cognition Arousal: Alert Behavior During Therapy: WFL for tasks assessed/performed Cognition: Cognition impaired  Orientation impairments: Time, Situation Awareness: Online awareness impaired Memory impairment (select all impairments): Short-term memory, Declarative long-term memory, Working memory Attention impairment (select first level of impairment): Divided attention, Sustained attention Executive functioning impairment (select all impairments): Organization, Reasoning          Following commands:  Impaired Following commands impaired: Follows one step commands with increased time      Cueing   Cueing Techniques: Verbal cues, Tactile cues, Gestural cues             Pertinent Vitals/ Pain       Pain Assessment Pain Assessment: No/denies pain   Frequency  Min 2X/week        Progress Toward Goals  OT Goals(current goals can now be found in the care plan section)  Progress towards OT goals: Progressing toward goals  Acute Rehab OT Goals OT Goal Formulation: With patient Time For Goal Achievement: 06/21/24 Potential to Achieve Goals: Good  Plan         AM-PAC OT 6 Clicks Daily Activity     Outcome Measure   Help from another person eating meals?: None Help from another person taking care of personal grooming?: A Little Help from another person toileting, which includes using toliet, bedpan, or urinal?: A Little Help from another person bathing (including washing, rinsing, drying)?: A Little Help from another person to put on and taking off regular upper body clothing?: A Little Help from another person to put on and taking off regular lower body clothing?: A Little 6 Click Score: 19    End of Session Equipment Utilized During Treatment: Gait belt;Rolling walker (2 wheels)  OT Visit Diagnosis: Unsteadiness on feet (R26.81);Other abnormalities of gait and mobility (R26.89);Muscle weakness (generalized) (M62.81);Other symptoms and signs involving cognitive function   Activity Tolerance Patient tolerated treatment well   Patient Left in chair;with call bell/phone within reach;with chair alarm set   Nurse Communication Mobility status        Time: 1110-1131 OT Time Calculation (min): 21 min  Charges: OT General Charges $OT Visit: 1 Visit OT Treatments $Therapeutic Activity: 8-22 mins   Ron DOROTHA Lesches, OTR/L  06/10/2024, 4:07 PM

## 2024-06-10 NOTE — Progress Notes (Signed)
 Anthony [name] FMW:[id number]  DOB: [birth year]  DOA: 06/05/2024  PCP: Madelon Donald HERO, DO  06/10/2024,11:30 AM  LOS: 5 days    Code Status: Full code     from: Home   74 year old black male  intracranial related left-sided weakness not TIA 2022--- prior acute right temporal stroke 05/2017 DMT by 2 with history of honk Chronic low back pain Atrial fibrillation with previous AV ablation for LV dysfunction/Medtronic BiV ICD placed--last echo EF 5060-65% mild LVH pacing wires RA/RV Fall to floor with rib fractures 01/2024 admission with scalp hematoma  06/05/2024 presented from home with altered mental status burning in the urine confused-wife reported decreased p.o. not eating drinking not checking home sugars sometimes with trouble getting words out-in ED found to have AKI with bun/cret 45/2.58, Gap 26-Co2 19--and felt to have DKA CT head on arrival no acute findings chest x-ray negative CT chest abdomen pelvis patchy ground glass opacities RLL?  Infectious inflammatory process severe atherosclerosis of abdominal aorta with 4 vessel coronary calcifications    Assessment  & Plan :    Toxic septic encephalopathy on admission related to DKA, pneumonia and is on oxycodone , gabapentin  and baclofen Mentation normalized on holding all opiate analgesia and try to minimize other meds as able Does not need sitter he is close to his baseline  Likely aspiration pneumonia causing sepsis on arrival Vancomycin  discontinued 1/14-cefepime  narrowed to doxycycline  1/14 additionally and will monitor fever trend etc. as CT showed right lower lobe opacities  DKA on admission with lactic acidosis Home meds Tresiba  28 units, 6 to 10 units twice daily meal DKA physiology is resolved-blood sugar control is quite brittle previously has had significant hypoglycemia and now is hyperglycemic Will give extra 5 units of insulin  now Continue mealtime sensitive coverage as well as Lantus  10--- not on mealtime insulin  so  we will add with the coverage  AKI superimposed on CKD 3 A LUTS/Boop Holding home Lasix  60 Hyzaar 50-12.5--- losartan  adjusted as below Bladder scan eventually showed retaining above 300 cc of urine so we will with placement of the same in the next several days and he will need outpatient follow-up  Subacute thrombocytopenia probably from sepsis on admission Unclear if concern for HIT--- platelets are already resolving so would not pursue Would hold workup at this time  A-fib slow RVR Medtronic PPM in place Continue Coreg  25 twice daily, Eliquis  5 twice daily other meds all held as above  Hypertensive urgency Blood pressures elevated as high as 190-Will add back losartan  at a lower dose of 25 mg today and keep the as needed hydralazine  tablets 25 on board  Stroke 2019, ?  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 Continues Aricept  5 at bedtime Paxil  30-would cut Seroquel  back to 12.5 at bedtime--- mentation closer to baseline  Data Reviewed today: Sodium 142 potassium 3.4 bicarb 22 BUN/creatinine 40/2.3 WBC 5.9 hemoglobin 13.0 platelet 80   DVT prophylaxis: Apixaban   Dispo/Global plan: Inpatient-will probably need authorization for skilled facility and will likely be able to discharge today or tomorrow if continues to be stable    Subjective:   Looks well feels reasonable no distress oriented just finished working with therapy Foley catheter placed and unfortunately will need to keep this in for now until can be seen in the outpatient  Objective + exam Vitals:   06/09/24 2150 06/10/24 0603 06/10/24 0620 06/10/24 0758  BP: (!) 174/95 (!) 197/122 (!) 197/122 (!) 151/77  Pulse: 70 70  70  Resp:  18 16  16   Temp: 98.2 F (36.8 C) 98 F (36.7 C)  98.5 F (36.9 C)  TempSrc: Oral Oral  Oral  SpO2: 100% 99%  100%  Weight:      Height:       Filed Weights   06/05/24 2200  Weight: 94.3 kg     Examination:   Awake coherent alert no distress EOMI NCAT no focal deficit  no icterus no pallor no wheeze no rales no rhonchi CTAB no added sound Abdomen soft no rebound no guarding no other focal neurological deficit Power 5/5 Foley in place now  Scheduled Meds:  apixaban   5 mg Oral BID   carvedilol   25 mg Oral BID   Chlorhexidine  Gluconate Cloth  6 each Topical Daily   donepezil   5 mg Oral QHS   dorzolamide -timolol   1 drop Both Eyes BID   doxycycline   100 mg Oral Q12H   feeding supplement  237 mL Oral BID BM   gabapentin   300 mg Oral Q8H   insulin  aspart  0-9 Units Subcutaneous TID WC   insulin  glargine  10 Units Subcutaneous Daily   latanoprost   1 drop Both Eyes QHS   pantoprazole   40 mg Oral Daily   PARoxetine   30 mg Oral Daily   QUEtiapine   12.5 mg Oral QHS   rosuvastatin   20 mg Oral Daily   tamsulosin   0.4 mg Oral Daily   Continuous Infusions:   acetaminophen  **OR** acetaminophen , hydrALAZINE , linaclotide , ondansetron  **OR** ondansetron  (ZOFRAN ) IV  Time 26  Colen Grimes, MD  [facility]

## 2024-06-10 NOTE — TOC Progression Note (Addendum)
 Transition of Care [facility]) - Progression Note    Patient Details  Name: Anthony [name] MRN: [id number] Date of Birth: 1950-07-20  Transition of Care [facility]) CM/SW Contact  Alfonse JONELLE Rex, RN Phone Number: 06/10/2024, 1:51 PM  Clinical Narrative:   Call to patient's spouse to review SNF Bed offers ( [name], [place], [place], [address] [facility], [facility]) , no answer, vm left with NCM name and phone number requesting a call back, no family at bedside.   -2;35pm Call received from patient's spouse, SNF bed offers reviewed, spouse accepted bed offer at [facility]. Call to [place], vm left informing family accepted SNF bed offer.   -2;48: Call from [facility] with [facility], bed offer for short term rehab. Call to pt's spouse, prefers [facility], changed bed offer acceptance to [facility].    Expected Discharge Plan: Skilled Nursing Facility                 Expected Discharge Plan and Services                                               Social Drivers of Health (SDOH) Interventions SDOH Screenings   Food Insecurity: No Food Insecurity (06/06/2024)  Housing: Low Risk (06/06/2024)  Transportation Needs: No Transportation Needs (06/06/2024)  Utilities: Not At Risk (06/06/2024)  Alcohol Screen: Low Risk (08/21/2023)  Depression (PHQ2-9): Low Risk (04/16/2024)  Financial Resource Strain: Low Risk (08/21/2023)  Physical Activity: Sufficiently Active (08/21/2023)  Social Connections: Socially Integrated (06/06/2024)  Stress: No Stress Concern Present (08/21/2023)  Tobacco Use: Medium Risk (06/08/2024)  Health Literacy: Adequate Health Literacy (08/21/2023)    Readmission Risk Interventions    06/09/2024   11:19 AM 02/09/2024   10:15 AM  Readmission Risk Prevention Plan  Transportation Screening Complete Complete  PCP or Specialist Appt within 3-5 Days  Complete  HRI or Home Care Consult  Complete  Social Work Consult for Recovery Care  Planning/Counseling  Complete  Palliative Care Screening  Complete  Medication Review Oceanographer) Complete Referral to Pharmacy  PCP or Specialist appointment within 3-5 days of discharge Complete   SW Recovery Care/Counseling Consult Complete   Palliative Care Screening Not Applicable   Skilled Nursing Facility Complete

## 2024-06-10 NOTE — Progress Notes (Signed)
 Mobility Specialist - Progress Note   06/10/24 0839  Mobility  Activity Ambulated with assistance  Level of Assistance Minimal assist, patient does 75% or more  Assistive Device Front wheel walker  Distance Ambulated (ft) 40 ft  Range of Motion/Exercises Active  Activity Response Tolerated well  Mobility visit 1 Mobility  Mobility Specialist Start Time (ACUTE ONLY) 0820  Mobility Specialist Stop Time (ACUTE ONLY) 0839  Mobility Specialist Time Calculation (min) (ACUTE ONLY) 19 min   Pt was found in bed and agreeable to mobilize. Min-A for Sit>stand and CG for ambulation. At EOS returned to recliner chair with all needs met. Call bell in reach and chair alarm on. Sitter at bedside.   Erminio Leos,  Mobility Specialist Can be reached via Secure Chat

## 2024-06-11 DIAGNOSIS — R6520 Severe sepsis without septic shock: Secondary | ICD-10-CM | POA: Diagnosis not present

## 2024-06-11 DIAGNOSIS — A419 Sepsis, unspecified organism: Secondary | ICD-10-CM | POA: Diagnosis not present

## 2024-06-11 LAB — BASIC METABOLIC PANEL WITH GFR
Anion gap: 9 (ref 5–15)
BUN: 37 mg/dL — ABNORMAL HIGH (ref 8–23)
CO2: 24 mmol/L (ref 22–32)
Calcium: 8.9 mg/dL (ref 8.9–10.3)
Chloride: 109 mmol/L (ref 98–111)
Creatinine, Ser: 2.36 mg/dL — ABNORMAL HIGH (ref 0.61–1.24)
GFR, Estimated: 29 mL/min — ABNORMAL LOW
Glucose, Bld: 228 mg/dL — ABNORMAL HIGH (ref 70–99)
Potassium: 3.6 mmol/L (ref 3.5–5.1)
Sodium: 142 mmol/L (ref 135–145)

## 2024-06-11 LAB — GLUCOSE, CAPILLARY
Glucose-Capillary: 200 mg/dL — ABNORMAL HIGH (ref 70–99)
Glucose-Capillary: 291 mg/dL — ABNORMAL HIGH (ref 70–99)
Glucose-Capillary: 273 mg/dL — ABNORMAL HIGH (ref 70–99)
Glucose-Capillary: 228 mg/dL — ABNORMAL HIGH (ref 70–99)

## 2024-06-11 MED ORDER — DONEPEZIL HCL 5 MG PO TABS: 5.0000 mg | ORAL_TABLET | Freq: Every day | ORAL | 0 refills | Status: AC

## 2024-06-11 MED ORDER — LOSARTAN POTASSIUM 25 MG PO TABS: 25.0000 mg | ORAL_TABLET | Freq: Every day | ORAL | Status: AC

## 2024-06-11 MED ORDER — ACETAMINOPHEN 325 MG PO TABS: 650.0000 mg | ORAL_TABLET | Freq: Four times a day (QID) | ORAL | Status: AC | PRN

## 2024-06-11 MED ORDER — GABAPENTIN 300 MG PO CAPS: 300.0000 mg | ORAL_CAPSULE | Freq: Three times a day (TID) | ORAL | 0 refills | Status: AC

## 2024-06-11 MED ORDER — PAROXETINE HCL 30 MG PO TABS: 30.0000 mg | ORAL_TABLET | Freq: Every day | ORAL | 0 refills | Status: AC

## 2024-06-11 MED ORDER — ORAL CARE MOUTH RINSE: 15.0000 mL | OROMUCOSAL | Status: DC | PRN

## 2024-06-11 MED ORDER — QUETIAPINE FUMARATE 25 MG PO TABS: 12.5000 mg | ORAL_TABLET | Freq: Every day | ORAL | 0 refills | Status: AC

## 2024-06-11 MED ORDER — DOXYCYCLINE HYCLATE 100 MG PO TABS: 100.0000 mg | ORAL_TABLET | Freq: Two times a day (BID) | ORAL | 0 refills | Status: DC

## 2024-06-11 MED ORDER — TRESIBA FLEXTOUCH 100 UNIT/ML ~~LOC~~ SOPN: 12.0000 [IU] | PEN_INJECTOR | SUBCUTANEOUS | Status: AC

## 2024-06-11 NOTE — Discharge Summary (Addendum)
 Physician Discharge Summary  Anthony [name] FMW:[id number] DOB: 09/18/1950 DOA: 06/05/2024  PCP: Madelon Donald HERO, DO  Admit date: 06/05/2024 Discharge date: 06/11/2024  Time spent: 37 minutes  Recommendations for Outpatient Follow-up:  Requires Chem-12 CBC in about 1 week at PCP office Has pretty brittle diabetes mellitus-given age as well as dementia, would not hold her to a very stringent A1c standard Needs outpatient coordination for follow-up with urology-had some urinary retention during hospital stay and Foley catheter was placed so will require outpatient urodynamic studies At follow-up once labs are done-workup thrombocytopenia felt needed Adjust antihypertensives based on labs etc.-might consider readdition of Lasix  which was held because of ATN on admission at a low dose or every other day on follow-up depending on fluid status when reevaluated   Discharge Diagnoses:  MAIN problem for hospitalization   Multifactorial encephalopathy likely sepsis related in addition to polypharmacy on admission DKA on admission Aspiration pneumonia on admission  Please see below for itemized issues addressed in HOpsital- refer to other progress notes for clarity if needed  Discharge Condition: Improved  Diet recommendation: Diabetic  Filed Weights   06/05/24 2200  Weight: Anthony [name]    History of present illness:  74 year old black male  intracranial related left-sided weakness not TIA 2022--- prior acute right temporal stroke 05/2017 DMT by 2 with history of honk Chronic low back pain Atrial fibrillation with previous AV ablation for LV dysfunction/Medtronic BiV ICD placed--last echo EF 5060-65% mild LVH pacing wires RA/RV Fall to floor with rib fractures 01/2024 admission with scalp hematoma   06/05/2024 presented from home with altered mental status burning in the urine confused-wife reported decreased p.o. not eating drinking not checking home sugars sometimes with trouble getting  words out-in ED found to have AKI with bun/cret 45/2.58, Gap 26-Co2 19--and felt to have DKA CT head on arrival no acute findings chest x-ray negative CT chest abdomen pelvis patchy ground glass opacities RLL?  Infectious inflammatory process severe atherosclerosis of abdominal aorta with 4 vessel coronary calcifications      Assessment  & Plan :      Toxic septic encephalopathy on admission related to DKA, pneumonia and is on oxycodone , gabapentin  and baclofen Mentation normalized on holding all opiate analgesia as well as minimizing other meds including gabapentin  baclofen etc. earlier in hospital stay- He required a sitter for safety and confusion however once metabolites cleared his system and he received adequate antibiotic treatment he seemed to improve and became more coherent   Likely aspiration pneumonia causing sepsis on arrival Vancomycin  discontinued 1/14-cefepime  narrowed to doxycycline  1/14 and will complete a course of antibiotics as an outpatient He had a pneumonitis probably secondary to the encephalopathy and inability to completely protect his airway-he is improved and eating a regular diet   DKA on admission with lactic acidosis Home meds Tresiba  28 units, 6 to 10 units twice daily meal DKA physiology is resolved-blood sugar control is quite brittle with high excursions of hypo and hyperglycemia Would err on the side of caution and allow for slightly higher blood sugars in the outpatient setting-at discharge I have adjusted downwards his insulins to Tresiba  12 units he can continue 6 units lunchtime 10 units with dinner which seems to be his home regimen and he should have consistent meals   AKI superimposed on CKD 3 A LUTS/Boop Holding home Lasix  60 Hyzaar 50-12.5--- losartan  adjusted as below Bladder scan eventually showed retaining above 300 cc of urine and he was not voiding completely Foley  catheter was placed this hospital stay on 1/15 he may need follow-up in about  1 month's time to ensure that he has urodynamic studies at a urology office appointment   Subacute thrombocytopenia probably from sepsis on admission Unclear if concern for HIT--- platelets are already resolving so would not pursue Would hold workup at this time and reevaluate with labs in about 1 week's time   A-fib slow RVR Medtronic PPM in place Continue Coreg  25 twice daily, Eliquis  5 twice daily other meds all held as above   Hypertensive urgency Blood pressures elevated as high as 190-Will add back losartan  at a lower dose of 25 mg today and keep the as needed hydralazine  tablets 25 on board He will also continue his usual Coreg  25 twice daily Would defer further outpatient titration of meds/addition of other meds like amlodipine  to primary care   Stroke 2019, ?  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 Continues Aricept  5 at bedtime Paxil  30-would cut Seroquel  back to 12.5 at bedtime--- mentation closer to baseline after adjustments of meds despite readding small amounts of them He does need to be monitored closely given he has propensity for confusion  Discharge Exam: Vitals:   06/10/24 2146 06/11/24 0618  BP: (!) 177/84 (!) 171/114  Pulse: 71 70  Resp: 19 18  Temp: 99.1 F (37.3 C) 98.2 F (36.8 C)  SpO2: 100% 100%    Subj on day of d/c   Sitting up in bed awakens readily waiting for breakfast no fever no nausea no other issue  General Exam on discharge  EOMI NCAT no focal deficit no icterus no pallor can tell me date time year-chest is clear no wheeze he has ICD in place--S1-S2 no murmur no rub no gallop--abdomen soft no rebound no guarding--no lower extremity edema--- ROM is intact   Discharge Instructions   Discharge Instructions     Continue Urinary Catheter   Complete by: As directed    Cotinue foley at time d/c   Increase activity slowly   Complete by: As directed       Allergies as of 06/11/2024       Reactions   Enalapril Maleate Cough         Medication List     STOP taking these medications    acetaminophen  650 MG CR tablet Commonly known as: TYLENOL  Replaced by: acetaminophen  325 MG tablet   baclofen 10 MG tablet Commonly known as: LIORESAL   furosemide  20 MG tablet Commonly known as: LASIX    loratadine  10 MG tablet Commonly known as: CLARITIN    losartan -hydrochlorothiazide  50-12.5 MG tablet Commonly known as: HYZAAR   Oxycodone  HCl 10 MG Tabs   polyethylene glycol 17 g packet Commonly known as: MIRALAX  / GLYCOLAX    senna 8.6 MG Tabs tablet Commonly known as: SENOKOT   tiZANidine 4 MG tablet Commonly known as: ZANAFLEX       TAKE these medications    acetaminophen  325 MG tablet Commonly known as: TYLENOL  Take 2 tablets (650 mg total) by mouth every 6 (six) hours as needed for mild pain (pain score 1-3) or fever (or Fever >/= 101). Replaces: acetaminophen  650 MG CR tablet   apixaban  5 MG Tabs tablet Commonly known as: Eliquis  Take 1 tablet (5 mg total) by mouth 2 (two) times daily.   BD Pen Needle Nano 2nd Gen 32G X 4 MM Misc Generic drug: Insulin  Pen Needle USE AS DIRECTED   calcitRIOL  0.5 MCG capsule Commonly known as: ROCALTROL  Take 0.5 mcg  by mouth daily.   carvedilol  25 MG tablet Commonly known as: COREG  TAKE 1 TABLET BY MOUTH TWICE A DAY   donepezil  5 MG tablet Commonly known as: ARICEPT  Take 1 tablet (5 mg total) by mouth at bedtime.   dorzolamide -timolol  2-0.5 % ophthalmic solution Commonly known as: COSOPT  Place 1 drop into both eyes 2 (two) times daily.   doxycycline  100 MG tablet Commonly known as: VIBRA -TABS Take 1 tablet (100 mg total) by mouth every 12 (twelve) hours for 4 days.   feeding supplement Liqd Take 237 mLs by mouth 2 (two) times daily between meals.   FreeStyle Libre 3 Plus Sensor Misc Change sensor every 15 days.   gabapentin  300 MG capsule Commonly known as: NEURONTIN  Take 1 capsule (300 mg total) by mouth every 8 (eight) hours. What changed:   medication strength how much to take   insulin  aspart 100 UNIT/ML FlexPen Commonly known as: NOVOLOG  Inject 6-10 Units into the skin 2 (two) times daily before a meal. Inject 6 units at lunch time and 10 units at dinner. What changed: how much to take   Lancet Device Misc Use 3 (three) times daily. May dispense any manufacturer covered by patient's insurance.   latanoprost  0.005 % ophthalmic solution Commonly known as: XALATAN  Place 1 drop into both eyes at bedtime.   lidocaine  5 % Commonly known as: LIDODERM  Place 1 patch onto the skin daily. Remove & Discard patch within 12 hours or as directed by MD   Linzess  145 MCG Caps capsule Generic drug: linaclotide  Take 145 mcg by mouth daily before breakfast.   losartan  25 MG tablet Commonly known as: COZAAR  Take 1 tablet (25 mg total) by mouth daily.   multivitamin tablet Take 1 tablet by mouth daily.   nitroGLYCERIN  0.4 MG SL tablet Commonly known as: NITROSTAT  Place 1 tablet (0.4 mg total) under the tongue every 5 (five) minutes as needed for chest pain. If no relief by 3rd tab, call 911   omeprazole  40 MG capsule Commonly known as: PRILOSEC Take 40 mg by mouth daily.   OneTouch Delica Plus Lancet33G Misc Use 1 each 3 (three) times daily. Use as directed to check blood sugar.   OneTouch Verio Flex System w/Device Kit Use 3 (three) times daily.   OneTouch Verio test strip Generic drug: glucose blood Use 3 (three) times daily. Use as directed to check blood sugar.   PARoxetine  30 MG tablet Commonly known as: PAXIL  Take 1 tablet (30 mg total) by mouth daily.   QUEtiapine  25 MG tablet Commonly known as: SEROQUEL  Take 0.5 tablets (12.5 mg total) by mouth at bedtime.   rosuvastatin  20 MG tablet Commonly known as: CRESTOR  TAKE 1 TABLET BY MOUTH EVERY DAY   tamsulosin  0.4 MG Caps capsule Commonly known as: FLOMAX  TAKE 1 CAPSULE BY MOUTH EVERYDAY AT BEDTIME What changed:  how much to take how to take this when  to take this   Tresiba  FlexTouch 100 UNIT/ML FlexTouch Pen Generic drug: insulin  degludec Inject 12 Units into the skin daily.   Vitamin D3 125 MCG (5000 UT) Caps Take 5,000 Units by mouth daily.       Allergies[1]  Contact information for after-discharge care     Destination     [facility] .   Service: Skilled Nursing Contact information: [address] [place]  [address] [phone]                      The results of significant  diagnostics from this hospitalization (including imaging, microbiology, ancillary and laboratory) are listed below for reference.    Significant Diagnostic Studies: CT CHEST ABDOMEN PELVIS WO CONTRAST Result Date: 06/06/2024 EXAM: CT CHEST, ABDOMEN AND PELVIS WITHOUT CONTRAST 06/06/2024 12:24:19 AM TECHNIQUE: CT of the chest, abdomen and pelvis was performed without the administration of intravenous contrast. Multiplanar reformatted images are provided for review. Automated exposure control, iterative reconstruction, and/or weight based adjustment of the mA/kV was utilized to reduce the radiation dose to as low as reasonably achievable. COMPARISON: None available. CLINICAL HISTORY: sepsis FINDINGS: CHEST: MEDIASTINUM AND LYMPH NODES: Enlarged heart. pericardium are unremarkable. 4-vessel coronary artery calcifications. The central airways are clear. No mediastinal, hilar or axillary lymphadenopathy. Possible trace pyuria appears associated with fluid within the esophageal lumen. No esophageal wall thickening. The thyroid  gland is unremarkable. LUNGS AND PLEURA: Patchy ground-glass opacities of the right lower lobe (4.3). No pleural effusion. No pneumothorax. ABDOMEN AND PELVIS: LIVER: Unremarkable. GALLBLADDER AND BILE DUCTS: Unremarkable. No biliary ductal dilatation. SPLEEN: No acute abnormality. PANCREAS: Diffusely atrophic pancreas. No focal lesion. Otherwise normal pancreatic contour. No surrounding inflammatory  changes. No main pancreatic ductal dilatation. ADRENAL GLANDS: No acute abnormality. KIDNEYS, URETERS AND BLADDER: Fluid density of the kidneys likely represent simple renal cysts. Simple renal cysts do not require additional follow-up unless clinically indicated due to signs/symptoms. Bilateral renal cortical scarring. Nonspecific bilateral perinephric fat stranding. No stones in the kidneys or ureters. No hydronephrosis. Urinary bladder is unremarkable. GI AND BOWEL: Stomach demonstrates no acute abnormality. No small or large bowel thickening or dilatation. The appendix is unremarkable. There is no bowel obstruction. REPRODUCTIVE ORGANS: No acute abnormality. PERITONEUM AND RETROPERITONEUM: No ascites. No free air. VASCULATURE: Moderate atherosclerotic plaque of the aorta. Severe atherosclerotic plaque of the abdominal aorta. ABDOMINAL AND PELVIS LYMPH NODES: No lymphadenopathy. REPRODUCTIVE ORGANS: No acute abnormality. 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 Grade 2 anterolisthesis of L5 on S1 with impaction of the L5 vertebral body onto the S1 superior endplate, with complete loss of the intervertebral disc space. Left chest wall dual lead cardiac defibrillator. Small fat-containing umbilical hernia. IMPRESSION: 1. Patchy ground-glass opacities in the right lower lobe, which may reflect an infectious or inflammatory process. 2. Severe atherosclerotic plaque of the abdominal aorta including 4-vessel coronary artery calcifications. 3. Other, non-acute and/or normal findings as above. Electronically signed by: Morgane Naveau MD MD 06/06/2024 12:36 AM EST RP Workstation: [id number]   CT HEAD WO CONTRAST Result Date: 06/05/2024 CLINICAL DATA:  Altered mental status. EXAM: CT HEAD WITHOUT CONTRAST TECHNIQUE: Contiguous axial images were obtained from the base of the skull through the vertex without intravenous contrast. RADIATION DOSE REDUCTION: This exam was  performed according to the departmental dose-optimization program which includes automated exposure control, adjustment of the mA and/or kV according to patient size and/or use of iterative reconstruction technique. COMPARISON:  02/06/2024 FINDINGS: Brain: No evidence of intracranial hemorrhage, acute infarction, hydrocephalus, extra-axial collection, or mass lesion/mass effect. Old left frontal lobe infarct again seen. Moderate diffuse cerebral atrophy and severe chronic small vessel disease are unchanged in appearance. Vascular:  No hyperdense vessel or other acute findings. Skull: No evidence of fracture or other significant bone abnormality. Sinuses/Orbits: No acute findings. Diffuse right maxillary sinus mucosal thickening again seen. Other: None. IMPRESSION: No acute intracranial abnormality. Old left frontal lobe infarct. Stable cerebral atrophy and chronic small vessel disease. Electronically Signed   By: Norleen DELENA Kil M.D.   On: 06/05/2024 13:49  DG Chest Port 1 View Result Date: 06/05/2024 CLINICAL DATA:  Altered mental status, confusion and weakness. EXAM: PORTABLE CHEST 1 VIEW COMPARISON:  01/26/2024 and CT chest 02/06/2024. FINDINGS: Trachea is midline. Pacemaker and ICD lead tips are stable in position. Heart is enlarged. Thoracic aorta is calcified. Lungs are clear. No pleural fluid. IMPRESSION: No acute findings. Electronically Signed   By: Newell Eke M.D.   On: 06/05/2024 13:47   CUP PACEART REMOTE DEVICE CHECK Result Date: 06/01/2024 ICD Scheduled remote reviewed. Normal device function.  Presenting rhythm:  BiV pace with PVC's HL=12 Next remote transmission per protocol. LA, CVRS   Microbiology: Recent Results (from the past 240 hours)  MRSA Next Gen by PCR, Nasal     Status: None   Collection Time: 06/08/24  5:15 PM   Specimen: Nasal Mucosa; Nasal Swab  Result Value Ref Range Status   MRSA by PCR Next Gen NOT DETECTED NOT DETECTED Final    Comment: (NOTE) The GeneXpert MRSA  Assay (FDA approved for NASAL specimens only), is one component of a comprehensive MRSA colonization surveillance program. It is not intended to diagnose MRSA infection nor to guide or monitor treatment for MRSA infections. Test performance is not FDA approved in patients less than 33 years old. Performed at [facility], [address], [place], [place] [address]      Labs: Basic Metabolic Panel: Recent Labs  Lab 06/05/24 1715 06/05/24 1930 06/07/24 0342 06/08/24 0421 06/09/24 0350 06/10/24 0338 06/11/24 0347  NA 141   < > 140 140 143 142 142  K 3.6   < > 3.4* 3.4* 3.8 3.4* 3.6  CL 99   < > 106 107 110 111 109  CO2 19*   < > 23 23 23 22 24   GLUCOSE 472*   < > 120* 205* 202* 230* 228*  BUN 45*   < > 42* 42* 44* 40* 37*  CREATININE 2.58*   < > 2.68* 2.65* 2.70* 2.38* 2.36*  CALCIUM  9.7   < > 9.0 8.7* 8.7* 8.8* 8.9  MG 2.0  --  1.6* 1.8 1.9  --   --   PHOS  --   --  2.3* 2.5  --   --   --    < > = values in this interval not displayed.   Liver Function Tests: Recent Labs  Lab 06/05/24 1715 06/07/24 0342 06/08/24 0421  AST 27  --   --   ALT 23  --   --   ALKPHOS 143*  --   --   BILITOT 1.8*  --   --   PROT 6.6  --   --   ALBUMIN 3.7 3.0* 2.8*   No results for input(s): LIPASE, AMYLASE in the last 168 hours. Recent Labs  Lab 06/05/24 1715  AMMONIA 32   CBC: Recent Labs  Lab 06/05/24 1601 06/07/24 0342 06/09/24 0350 06/10/24 0338  WBC 6.8 5.7 5.1 5.9  NEUTROABS 5.1 3.7  --  3.9  HGB 16.5 14.0 12.7* 13.0  HCT 50.4 41.0 37.7* 38.6*  MCV 89.8 88.0 88.7 88.1  PLT 41* 47* 63* 80*   Cardiac Enzymes: No results for input(s): CKTOTAL, CKMB, CKMBINDEX, TROPONINI in the last 168 hours. BNP: BNP (last 3 results) No results for input(s): BNP in the last 8760 hours.  ProBNP (last 3 results) No results for input(s): PROBNP in the last 8760 hours.  CBG: Recent Labs  Lab 06/10/24 0744 06/10/24 1116 06/10/24 1703  06/10/24  2149 06/11/24 0820  GLUCAP 186* 400* 162* 159* 200*    Signed:  Colen Grimes MD   [facility] 06/11/2024, 9:42 AM      [1]  Allergies Allergen Reactions   Enalapril Maleate Cough

## 2024-06-11 NOTE — TOC Progression Note (Signed)
 Transition of Care [facility]) - Progression Note    Patient Details  Name: Anthony [name] MRN: [id number] Date of Birth: 1950/08/17  [facility] CM/SW Contact  Alfonse JONELLE Rex, RN Phone Number: 06/11/2024, 1:30 PM  Clinical Narrative:   Call from [employer] with approval for non emergency ambulance. Auth# [id number]; Approved for short term rehab/SNF Auth# [id number], team notified.     Expected Discharge Plan: Skilled Nursing Facility                 Expected Discharge Plan and Services         Expected Discharge Date: 06/11/24                                     Social Drivers of Health (SDOH) Interventions SDOH Screenings   Food Insecurity: No Food Insecurity (06/06/2024)  Housing: Low Risk (06/06/2024)  Transportation Needs: No Transportation Needs (06/06/2024)  Utilities: Not At Risk (06/06/2024)  Alcohol Screen: Low Risk (08/21/2023)  Depression (PHQ2-9): Low Risk (04/16/2024)  Financial Resource Strain: Low Risk (08/21/2023)  Physical Activity: Sufficiently Active (08/21/2023)  Social Connections: Socially Integrated (06/06/2024)  Stress: No Stress Concern Present (08/21/2023)  Tobacco Use: Medium Risk (06/08/2024)  Health Literacy: Adequate Health Literacy (08/21/2023)    Readmission Risk Interventions    06/09/2024   11:19 AM 02/09/2024   10:15 AM  Readmission Risk Prevention Plan  Transportation Screening Complete Complete  PCP or Specialist Appt within 3-5 Days  Complete  HRI or Home Care Consult  Complete  Social Work Consult for Recovery Care Planning/Counseling  Complete  Palliative Care Screening  Complete  Medication Review Oceanographer) Complete Referral to Pharmacy  PCP or Specialist appointment within 3-5 days of discharge Complete   SW Recovery Care/Counseling Consult Complete   Palliative Care Screening Not Applicable   Skilled Nursing Facility Complete

## 2024-06-12 DIAGNOSIS — A419 Sepsis, unspecified organism: Secondary | ICD-10-CM | POA: Diagnosis not present

## 2024-06-12 DIAGNOSIS — R6520 Severe sepsis without septic shock: Secondary | ICD-10-CM | POA: Diagnosis not present

## 2024-06-12 LAB — BASIC METABOLIC PANEL WITH GFR
Anion gap: 9 (ref 5–15)
BUN: 40 mg/dL — ABNORMAL HIGH (ref 8–23)
CO2: 24 mmol/L (ref 22–32)
Calcium: 8.7 mg/dL — ABNORMAL LOW (ref 8.9–10.3)
Chloride: 108 mmol/L (ref 98–111)
Creatinine, Ser: 2.34 mg/dL — ABNORMAL HIGH (ref 0.61–1.24)
GFR, Estimated: 29 mL/min — ABNORMAL LOW
Glucose, Bld: 326 mg/dL — ABNORMAL HIGH (ref 70–99)
Potassium: 4 mmol/L (ref 3.5–5.1)
Sodium: 141 mmol/L (ref 135–145)

## 2024-06-12 LAB — GLUCOSE, CAPILLARY
Glucose-Capillary: 231 mg/dL — ABNORMAL HIGH (ref 70–99)
Glucose-Capillary: 181 mg/dL — ABNORMAL HIGH (ref 70–99)
Glucose-Capillary: 249 mg/dL — ABNORMAL HIGH (ref 70–99)
Glucose-Capillary: 387 mg/dL — ABNORMAL HIGH (ref 70–99)

## 2024-06-12 MED ORDER — HYDRALAZINE HCL 50 MG PO TABS: 50.0000 mg | ORAL_TABLET | Freq: Three times a day (TID) | ORAL | Status: AC

## 2024-06-12 MED ORDER — HYDRALAZINE HCL 50 MG PO TABS
50.0000 mg | ORAL_TABLET | Freq: Three times a day (TID) | ORAL | Status: DC
  Administered 2024-06-12 – 2024-06-14 (×7): 50 mg via ORAL
  Filled 2024-06-12 (×7): qty 1

## 2024-06-12 NOTE — Plan of Care (Signed)
  Problem: Activity: Goal: Risk for activity intolerance will decrease Outcome: Progressing   Problem: Elimination: Goal: Will not experience complications related to urinary retention Outcome: Progressing   

## 2024-06-12 NOTE — Plan of Care (Signed)
" °  Problem: Activity: Goal: Risk for activity intolerance will decrease Outcome: Adequate for Discharge   Problem: Elimination: Goal: Will not experience complications related to urinary retention Outcome: Not Met (add Reason) Note: Foley placed.  Output adequate.  Will d/c with the foley.   "

## 2024-06-12 NOTE — Progress Notes (Signed)
 TR[facility]  ZEKE AKER FMW:[id number]  DOB: 27-Sep-1950  DOA: 06/05/2024  PCP: Madelon Donald HERO, DO  06/12/2024,1:17 PM  LOS: 7 days    Code Status: Full code     from: Home   74 year old black male  intracranial related left-sided weakness not TIA 2022--- prior acute right temporal stroke 05/2017 DMT by 2 with history of honk Chronic low back pain Atrial fibrillation with previous AV ablation for LV dysfunction/Medtronic BiV ICD placed--last echo EF 5060-65% mild LVH pacing wires RA/RV Fall to floor with rib fractures 01/2024 admission with scalp hematoma  06/05/2024 presented from home with altered mental status burning in the urine confused-wife reported decreased p.o. not eating drinking not checking home sugars sometimes with trouble getting words out-in ED found to have AKI with bun/cret 45/2.58, Gap 26-Co2 19--and felt to have DKA CT head on arrival no acute findings chest x-ray negative CT chest abdomen pelvis patchy ground glass opacities RLL?  Infectious inflammatory process severe atherosclerosis of abdominal aorta with 4 vessel coronary calcifications    Assessment  & Plan :    Toxic septic encephalopathy on admission related to DKA, pneumonia and is on oxycodone , gabapentin  and baclofen Mentation normalized on holding all opiates baclofen gabapentin  Needed sitter earlier is much more coherent   Likely aspiration pneumonia causing sepsis on arrival Vancomycin  discontinued 1/14-cefepime  narrowed to doxycycline  1/14 additionally and will monitor fever trend etc. as CT showed right lower lobe opacities--doxycycline  EOT 1/20  DKA on admission with lactic acidosis Home meds Tresiba  28 units, 6 to 10 units twice daily meal DKA physiology is resolved-blood sugar control is quite brittle both hypo and hyperglycemia continue mealtime 2 units 3 times daily meal, sliding scale as well as Lantus  10-CBG ranging 181-231  AKI superimposed on CKD 3 A LUTS/Boop Holding home Lasix  60 Hyzaar  50-12.5--- losartan  adjusted as below Bladder scan 1/15 retaining above 300 cc of urine--- we will try voiding trial tomorrow morning if he is still here to see if we can remove the catheter  Subacute thrombocytopenia probably from sepsis on admission Unclear if concern for HIT--- platelets are already resolving Get labs periodically  A-fib slow RVR Medtronic PPM in place Continue Coreg  25 twice daily, Eliquis  5 twice daily--- meds adjusted this hospital stay  Hypertensive urgency Blood pressures elevated as high as 190-1/15 added back losartan  25 mg -change hydralazine  to scheduled 50 twice daily  Stroke 2019, ?  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 Continues Aricept  5 at bedtime Paxil  30-would cut Seroquel  back to 12.5 at bedtime--- mentation clear  Data Reviewed today: Sodium 142 potassium 4.0 BUN/creatinine 40/2.3   DVT prophylaxis: Apixaban   Dispo/Global plan: Patient is discharged we are awaiting skilled care   Subjective:   In good spirits sitting up at the bedside ate most of his meal but the chicken was a little tough-no chest pain no fever no nausea no vomiting--- no pain no other concerns at this time Foley remains in place  Objective + exam Vitals:   06/10/24 2146 06/10/24 2146 06/11/24 0618 06/11/24 2145  BP: (!) 177/84 (!) 177/84 (!) 171/114 (!) 180/90  Pulse: 72 71 70 70  Resp: 19 19 18 16   Temp: 99.1 F (37.3 C) 99.1 F (37.3 C) 98.2 F (36.8 C) 98.2 F (36.8 C)  TempSrc: Oral Oral Oral Oral  SpO2: 100% 100% 100% 100%  Weight:      Height:       Filed Weights   06/05/24 2200  Weight: 94.3  kg     Examination:  Awake alert coherent no distress looks well feels fair no blurred vision no double vision CTAB normal heart sounds no wheeze rales rhonchi Abdomen soft Foley catheter is in place No lower extremity edema   Scheduled Meds:  apixaban   5 mg Oral BID   carvedilol   25 mg Oral BID   Chlorhexidine  Gluconate Cloth  6 each Topical  Daily   donepezil   5 mg Oral QHS   dorzolamide -timolol   1 drop Both Eyes BID   doxycycline   100 mg Oral Q12H   feeding supplement  237 mL Oral BID BM   gabapentin   300 mg Oral Q8H   hydrALAZINE   50 mg Oral Q8H   insulin  aspart  0-9 Units Subcutaneous TID WC   insulin  aspart  2 Units Subcutaneous TID WC   insulin  glargine  10 Units Subcutaneous Daily   insulin  glargine-yfgn  10 Units Subcutaneous QHS   latanoprost   1 drop Both Eyes QHS   losartan   25 mg Oral Daily   pantoprazole   40 mg Oral Daily   PARoxetine   30 mg Oral Daily   QUEtiapine   12.5 mg Oral QHS   rosuvastatin   20 mg Oral Daily   tamsulosin   0.4 mg Oral Daily   Continuous Infusions:   acetaminophen  **OR** acetaminophen , hydrALAZINE , linaclotide , ondansetron  **OR** ondansetron  (ZOFRAN ) IV, mouth rinse  Time 16  Jai-Gurmukh Chrishawna Farina, MD  [facility]

## 2024-06-12 NOTE — TOC Progression Note (Addendum)
 Transition of Care [facility]) - Progression Note    Patient Details  Name: Anthony [name] MRN: [id number] Date of Birth: 1950-12-30  [facility] CM/SW Contact  Alfonse JONELLE Rex, RN Phone Number: 06/12/2024, 1:05 PM  Clinical Narrative:   Call to [facility], transferred to vm for weekend admit coordinator, left vm with NCM name and phone number requesting a call back.   -1:08pm Call received from Kia w/[facility], reports no beds available today, request follow up tomorrow.     Expected Discharge Plan: Skilled Nursing Facility                 Expected Discharge Plan and Services         Expected Discharge Date: 06/11/24                                     Social Drivers of Health (SDOH) Interventions SDOH Screenings   Food Insecurity: No Food Insecurity (06/06/2024)  Housing: Low Risk (06/06/2024)  Transportation Needs: No Transportation Needs (06/06/2024)  Utilities: Not At Risk (06/06/2024)  Alcohol Screen: Low Risk (08/21/2023)  Depression (PHQ2-9): Low Risk (04/16/2024)  Financial Resource Strain: Low Risk (08/21/2023)  Physical Activity: Sufficiently Active (08/21/2023)  Social Connections: Socially Integrated (06/06/2024)  Stress: No Stress Concern Present (08/21/2023)  Tobacco Use: Medium Risk (06/08/2024)  Health Literacy: Adequate Health Literacy (08/21/2023)    Readmission Risk Interventions    06/09/2024   11:19 AM 02/09/2024   10:15 AM  Readmission Risk Prevention Plan  Transportation Screening Complete Complete  PCP or Specialist Appt within 3-5 Days  Complete  HRI or Home Care Consult  Complete  Social Work Consult for Recovery Care Planning/Counseling  Complete  Palliative Care Screening  Complete  Medication Review [employer]) Complete Referral to Pharmacy  PCP or Specialist appointment within 3-5 days of discharge Complete   SW Recovery Care/Counseling Consult Complete   Palliative Care Screening Not  Applicable   Skilled Nursing Facility Complete

## 2024-06-13 LAB — BASIC METABOLIC PANEL WITH GFR
Anion gap: 10 (ref 5–15)
BUN: 41 mg/dL — ABNORMAL HIGH (ref 8–23)
CO2: 23 mmol/L (ref 22–32)
Calcium: 8.4 mg/dL — ABNORMAL LOW (ref 8.9–10.3)
Chloride: 107 mmol/L (ref 98–111)
Creatinine, Ser: 2.75 mg/dL — ABNORMAL HIGH (ref 0.61–1.24)
GFR, Estimated: 24 mL/min — ABNORMAL LOW
Glucose, Bld: 306 mg/dL — ABNORMAL HIGH (ref 70–99)
Potassium: 3.5 mmol/L (ref 3.5–5.1)
Sodium: 140 mmol/L (ref 135–145)

## 2024-06-13 LAB — GLUCOSE, CAPILLARY
Glucose-Capillary: 207 mg/dL — ABNORMAL HIGH (ref 70–99)
Glucose-Capillary: 218 mg/dL — ABNORMAL HIGH (ref 70–99)
Glucose-Capillary: 250 mg/dL — ABNORMAL HIGH (ref 70–99)
Glucose-Capillary: 330 mg/dL — ABNORMAL HIGH (ref 70–99)

## 2024-06-13 NOTE — Plan of Care (Signed)
  Problem: Activity: Goal: Risk for activity intolerance will decrease Outcome: Progressing   Problem: Elimination: Goal: Will not experience complications related to urinary retention Outcome: Progressing   

## 2024-06-13 NOTE — Progress Notes (Signed)
 Patient sitting up in the bed no distress eating drinking overall well no new complaints asking when he can leave the hospital wants to go home with wife-explained to him need skilled-sugars reasonably controlled 200-300-we await skilled bed-no charge  Reggie Grimes, MD [facility] 11:18 AM

## 2024-06-14 LAB — GLUCOSE, CAPILLARY
Glucose-Capillary: 269 mg/dL — ABNORMAL HIGH (ref 70–99)
Glucose-Capillary: 231 mg/dL — ABNORMAL HIGH (ref 70–99)

## 2024-06-14 LAB — BASIC METABOLIC PANEL WITH GFR
Anion gap: 9 (ref 5–15)
BUN: 39 mg/dL — ABNORMAL HIGH (ref 8–23)
CO2: 24 mmol/L (ref 22–32)
Calcium: 8.7 mg/dL — ABNORMAL LOW (ref 8.9–10.3)
Chloride: 108 mmol/L (ref 98–111)
Creatinine, Ser: 2.44 mg/dL — ABNORMAL HIGH (ref 0.61–1.24)
GFR, Estimated: 27 mL/min — ABNORMAL LOW
Glucose, Bld: 333 mg/dL — ABNORMAL HIGH (ref 70–99)
Potassium: 3.5 mmol/L (ref 3.5–5.1)
Sodium: 140 mmol/L (ref 135–145)

## 2024-06-14 MED ORDER — DOXYCYCLINE HYCLATE 100 MG PO TABS: 100.0000 mg | ORAL_TABLET | Freq: Two times a day (BID) | ORAL | Status: AC

## 2024-06-14 NOTE — Discharge Summary (Signed)
 Physician Discharge Summary  Anthony [name] FMW:[id number] DOB: 24-Nov-1950 DOA: 06/05/2024  PCP: Madelon Donald HERO, DO  Admit date: 06/05/2024 Discharge date: 06/14/2024  Time spent: 37 minutes  Recommendations for Outpatient Follow-up:  Requires Chem-12 CBC in about 1 week at PCP office--if labs show persisting thrombocytopenia needs workup Has pretty brittle diabetes mellitus-given age as well as dementia, do not hold to very stringent A1c standard Urinary catheter removed this hospital stay-watch for retention at facility Adjust antihypertensives based on labs etc.-might consider readdition of Lasix  which was held because of ATN on admission at a low dose or every other day on follow-up depending on fluid status when reevaluated   Discharge Diagnoses:  MAIN problem for hospitalization   Multifactorial encephalopathy likely sepsis related in addition to polypharmacy on admission DKA on admission Aspiration pneumonia on admission  Please see below for itemized issues addressed in HOpsital- refer to other progress notes for clarity if needed  Discharge Condition: Improved  Diet recommendation: Diabetic  Filed Weights   06/05/24 2200  Weight: 94.3 kg    History of present illness:  74 year old black male  intracranial related left-sided weakness not TIA 2022--- prior acute right temporal stroke 05/2017 DMT by 2 with history of honk Chronic low back pain Atrial fibrillation with previous AV ablation for LV dysfunction/Medtronic BiV ICD placed--last echo EF 5060-65% mild LVH pacing wires RA/RV Fall to floor with rib fractures 01/2024 admission with scalp hematoma   06/05/2024 presented from home with altered mental status burning in the urine confused-wife reported decreased p.o. not eating drinking not checking home sugars sometimes with trouble getting words out-in ED found to have AKI with bun/cret 45/2.58, Gap 26-Co2 19--and felt to have DKA CT head on arrival no acute findings  chest x-ray negative CT chest abdomen pelvis patchy ground glass opacities RLL?  Infectious inflammatory process severe atherosclerosis of abdominal aorta with 4 vessel coronary calcifications      Assessment  & Plan :      Toxic septic encephalopathy on admission related to DKA, pneumonia and is on oxycodone , gabapentin  and baclofen Mentation normalized on holding all opiate analgesia as well as minimizing other meds including gabapentin  baclofen etc. earlier in hospital stay- He required a sitter for safety and confusion however once metabolites cleared his system and he received adequate antibiotic treatment he seemed to improve and became more coherent He is close to baseline now and is stabilized for continued skilled therapy   Likely aspiration pneumonia causing sepsis on arrival Vancomycin  discontinued 1/14-cefepime  narrowed to doxycycline  1/14 and will complete a course of antibiotics as an outpatient ending on 1/20 -This was secondary to encephalopathy and inability to completely protect his airway-he is improved and eating a regular diet at time of discharge   DKA on admission with lactic acidosis Home meds Tresiba  28 units, 6 to 10 units twice daily meal DKA physiology is resolved-blood sugar control is quite brittle with high excursions of hypo and hyperglycemia Would err on the side of caution and allow for slightly higher blood sugars in the outpatient setting-at discharge I have adjusted downwards his insulins to Tresiba  12 units he can continue 6 units lunchtime 10 units with dinner which seems to be his home regimen and he should have consistent meals   AKI superimposed on CKD 3 A LUTS/Boop Holding home Lasix  60 Hyzaar 50-12.5--- losartan  adjusted as below Bladder scan eventually showed retaining above 300 cc of urine and he was not voiding completely Foley catheter was placed  this hospital stay on 1/15 but removed prior to discharge on 1/19 and he will have a voiding trial  in the hospital-if he does not void he will need to have it replaced and managed as an outpatient with urology   Subacute thrombocytopenia probably from sepsis on admission Unclear if concern for HIT--- platelets are already resolving so would not pursue Would hold workup at this time and reevaluate with labs in about 1 week's time   A-fib slow RVR Medtronic PPM in place Continue Coreg  25 twice daily, Eliquis  5 twice daily other meds all held as above   Hypertensive urgency Blood pressures elevated as high as 190-zoomed losartan  at a lower dose of 25 mg today and keep the as needed hydralazine  tablets 50 mg 3 times daily He will also continue his usual Coreg  25 twice daily Would defer further outpatient titration of meds/addition of other meds like amlodipine  to primary care   Stroke 2019, ?  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 Continues Aricept  5 at bedtime Paxil  30-would cut Seroquel  back to 12.5 at bedtime--- mentation closer to baseline after adjustments of meds despite readding small amounts of them He does need to be monitored closely given he has propensity for confusion  Discharge Exam: Vitals:   06/13/24 2117 06/14/24 0453  BP: (!) 168/96 (!) 140/85  Pulse: 69 70  Resp: 16 20  Temp: 98.8 F (37.1 C) 99.1 F (37.3 C)  SpO2: 100% 98%    Subj on day of d/c   Catheter removed this morning overall looking well eating drinking no fever no chills no nausea no vomiting no new other issues no chills no rigor   General Exam on discharge  EOMI NCAT no focal deficit no icterus no pallor coherent X4-chest clear no wheeze rales rhonchi no focal neurological deficit--S1-S2 no murmur no rub no gallop--abdomen soft no rebound no guarding--power 5/5 neuro grossly intact  Discharge Instructions   Discharge Instructions     Continue Urinary Catheter   Complete by: As directed    Cotinue foley at time d/c   Increase activity slowly   Complete by: As directed        Allergies as of 06/14/2024       Reactions   Enalapril Maleate Cough        Medication List     STOP taking these medications    acetaminophen  650 MG CR tablet Commonly known as: TYLENOL  Replaced by: acetaminophen  325 MG tablet   baclofen 10 MG tablet Commonly known as: LIORESAL   furosemide  20 MG tablet Commonly known as: LASIX    loratadine  10 MG tablet Commonly known as: CLARITIN    losartan -hydrochlorothiazide  50-12.5 MG tablet Commonly known as: HYZAAR   Oxycodone  HCl 10 MG Tabs   polyethylene glycol 17 g packet Commonly known as: MIRALAX  / GLYCOLAX    senna 8.6 MG Tabs tablet Commonly known as: SENOKOT   tiZANidine 4 MG tablet Commonly known as: ZANAFLEX       TAKE these medications    acetaminophen  325 MG tablet Commonly known as: TYLENOL  Take 2 tablets (650 mg total) by mouth every 6 (six) hours as needed for mild pain (pain score 1-3) or fever (or Fever >/= 101). Replaces: acetaminophen  650 MG CR tablet   apixaban  5 MG Tabs tablet Commonly known as: Eliquis  Take 1 tablet (5 mg total) by mouth 2 (two) times daily.   BD Pen Needle Nano 2nd Gen 32G X 4 MM Misc Generic drug: Insulin  Pen Needle USE AS  DIRECTED   calcitRIOL  0.5 MCG capsule Commonly known as: ROCALTROL  Take 0.5 mcg by mouth daily.   carvedilol  25 MG tablet Commonly known as: COREG  TAKE 1 TABLET BY MOUTH TWICE A DAY   donepezil  5 MG tablet Commonly known as: ARICEPT  Take 1 tablet (5 mg total) by mouth at bedtime.   dorzolamide -timolol  2-0.5 % ophthalmic solution Commonly known as: COSOPT  Place 1 drop into both eyes 2 (two) times daily.   doxycycline  100 MG tablet Commonly known as: VIBRA -TABS Take 1 tablet (100 mg total) by mouth every 12 (twelve) hours for 1 day.   feeding supplement Liqd Take 237 mLs by mouth 2 (two) times daily between meals.   FreeStyle Libre 3 Plus Sensor Misc Change sensor every 15 days.   gabapentin  300 MG capsule Commonly known as:  NEURONTIN  Take 1 capsule (300 mg total) by mouth every 8 (eight) hours. What changed:  medication strength how much to take   hydrALAZINE  50 MG tablet Commonly known as: APRESOLINE  Take 1 tablet (50 mg total) by mouth every 8 (eight) hours.   insulin  aspart 100 UNIT/ML FlexPen Commonly known as: NOVOLOG  Inject 6-10 Units into the skin 2 (two) times daily before a meal. Inject 6 units at lunch time and 10 units at dinner. What changed: how much to take   Lancet Device Misc Use 3 (three) times daily. May dispense any manufacturer covered by patient's insurance.   latanoprost  0.005 % ophthalmic solution Commonly known as: XALATAN  Place 1 drop into both eyes at bedtime.   lidocaine  5 % Commonly known as: LIDODERM  Place 1 patch onto the skin daily. Remove & Discard patch within 12 hours or as directed by MD   Linzess  145 MCG Caps capsule Generic drug: linaclotide  Take 145 mcg by mouth daily before breakfast.   losartan  25 MG tablet Commonly known as: COZAAR  Take 1 tablet (25 mg total) by mouth daily.   multivitamin tablet Take 1 tablet by mouth daily.   nitroGLYCERIN  0.4 MG SL tablet Commonly known as: NITROSTAT  Place 1 tablet (0.4 mg total) under the tongue every 5 (five) minutes as needed for chest pain. If no relief by 3rd tab, call 911   omeprazole  40 MG capsule Commonly known as: PRILOSEC Take 40 mg by mouth daily.   OneTouch Delica Plus Lancet33G Misc Use 1 each 3 (three) times daily. Use as directed to check blood sugar.   OneTouch Verio Flex System w/Device Kit Use 3 (three) times daily.   OneTouch Verio test strip Generic drug: glucose blood Use 3 (three) times daily. Use as directed to check blood sugar.   PARoxetine  30 MG tablet Commonly known as: PAXIL  Take 1 tablet (30 mg total) by mouth daily.   QUEtiapine  25 MG tablet Commonly known as: SEROQUEL  Take 0.5 tablets (12.5 mg total) by mouth at bedtime.   rosuvastatin  20 MG tablet Commonly known  as: CRESTOR  TAKE 1 TABLET BY MOUTH EVERY DAY   tamsulosin  0.4 MG Caps capsule Commonly known as: FLOMAX  TAKE 1 CAPSULE BY MOUTH EVERYDAY AT BEDTIME What changed:  how much to take how to take this when to take this   Tresiba  FlexTouch 100 UNIT/ML FlexTouch Pen Generic drug: insulin  degludec Inject 12 Units into the skin daily.   Vitamin D3 125 MCG (5000 UT) Caps Take 5,000 Units by mouth daily.       Allergies[1]  Contact information for after-discharge care     Destination     [facility] .   Service: Skilled  Nursing Contact information: [address] [place]  [address] [phone]                      The results of significant diagnostics from this hospitalization (including imaging, microbiology, ancillary and laboratory) are listed below for reference.    Significant Diagnostic Studies: CT CHEST ABDOMEN PELVIS WO CONTRAST Result Date: 06/06/2024 EXAM: CT CHEST, ABDOMEN AND PELVIS WITHOUT CONTRAST 06/06/2024 12:24:19 AM TECHNIQUE: CT of the chest, abdomen and pelvis was performed without the administration of intravenous contrast. Multiplanar reformatted images are provided for review. Automated exposure control, iterative reconstruction, and/or weight based adjustment of the mA/kV was utilized to reduce the radiation dose to as low as reasonably achievable. COMPARISON: None available. CLINICAL HISTORY: sepsis FINDINGS: CHEST: MEDIASTINUM AND LYMPH NODES: Enlarged heart. pericardium are unremarkable. 4-vessel coronary artery calcifications. The central airways are clear. No mediastinal, hilar or axillary lymphadenopathy. Possible trace pyuria appears associated with fluid within the esophageal lumen. No esophageal wall thickening. The thyroid  gland is unremarkable. LUNGS AND PLEURA: Patchy ground-glass opacities of the right lower lobe (4.3). No pleural effusion. No pneumothorax. ABDOMEN AND PELVIS: LIVER: Unremarkable. GALLBLADDER AND  BILE DUCTS: Unremarkable. No biliary ductal dilatation. SPLEEN: No acute abnormality. PANCREAS: Diffusely atrophic pancreas. No focal lesion. Otherwise normal pancreatic contour. No surrounding inflammatory changes. No main pancreatic ductal dilatation. ADRENAL GLANDS: No acute abnormality. KIDNEYS, URETERS AND BLADDER: Fluid density of the kidneys likely represent simple renal cysts. Simple renal cysts do not require additional follow-up unless clinically indicated due to signs/symptoms. Bilateral renal cortical scarring. Nonspecific bilateral perinephric fat stranding. No stones in the kidneys or ureters. No hydronephrosis. Urinary bladder is unremarkable. GI AND BOWEL: Stomach demonstrates no acute abnormality. No small or large bowel thickening or dilatation. The appendix is unremarkable. There is no bowel obstruction. REPRODUCTIVE ORGANS: No acute abnormality. PERITONEUM AND RETROPERITONEUM: No ascites. No free air. VASCULATURE: Moderate atherosclerotic plaque of the aorta. Severe atherosclerotic plaque of the abdominal aorta. ABDOMINAL AND PELVIS LYMPH NODES: No lymphadenopathy. REPRODUCTIVE ORGANS: No acute abnormality. 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 Grade 2 anterolisthesis of L5 on S1 with impaction of the L5 vertebral body onto the S1 superior endplate, with complete loss of the intervertebral disc space. Left chest wall dual lead cardiac defibrillator. Small fat-containing umbilical hernia. IMPRESSION: 1. Patchy ground-glass opacities in the right lower lobe, which may reflect an infectious or inflammatory process. 2. Severe atherosclerotic plaque of the abdominal aorta including 4-vessel coronary artery calcifications. 3. Other, non-acute and/or normal findings as above. Electronically signed by: Morgane Naveau MD MD 06/06/2024 12:36 AM EST RP Workstation: [id number]   CT HEAD WO CONTRAST Result Date: 06/05/2024 CLINICAL DATA:  Altered mental  status. EXAM: CT HEAD WITHOUT CONTRAST TECHNIQUE: Contiguous axial images were obtained from the base of the skull through the vertex without intravenous contrast. RADIATION DOSE REDUCTION: This exam was performed according to the departmental dose-optimization program which includes automated exposure control, adjustment of the mA and/or kV according to patient size and/or use of iterative reconstruction technique. COMPARISON:  02/06/2024 FINDINGS: Brain: No evidence of intracranial hemorrhage, acute infarction, hydrocephalus, extra-axial collection, or mass lesion/mass effect. Old left frontal lobe infarct again seen. Moderate diffuse cerebral atrophy and severe chronic small vessel disease are unchanged in appearance. Vascular:  No hyperdense vessel or other acute findings. Skull: No evidence of fracture or other significant bone abnormality. Sinuses/Orbits: No acute findings. Diffuse right maxillary sinus mucosal thickening again seen.  Other: None. IMPRESSION: No acute intracranial abnormality. Old left frontal lobe infarct. Stable cerebral atrophy and chronic small vessel disease. Electronically Signed   By: Norleen DELENA Kil M.D.   On: 06/05/2024 13:49   DG Chest Port 1 View Result Date: 06/05/2024 CLINICAL DATA:  Altered mental status, confusion and weakness. EXAM: PORTABLE CHEST 1 VIEW COMPARISON:  01/26/2024 and CT chest 02/06/2024. FINDINGS: Trachea is midline. Pacemaker and ICD lead tips are stable in position. Heart is enlarged. Thoracic aorta is calcified. Lungs are clear. No pleural fluid. IMPRESSION: No acute findings. Electronically Signed   By: Newell Eke M.D.   On: 06/05/2024 13:47   CUP PACEART REMOTE DEVICE CHECK Result Date: 06/01/2024 ICD Scheduled remote reviewed. Normal device function.  Presenting rhythm:  BiV pace with PVC's HL=12 Next remote transmission per protocol. LA, CVRS   Microbiology: Recent Results (from the past 240 hours)  MRSA Next Gen by PCR, Nasal     Status: None    Collection Time: 06/08/24  5:15 PM   Specimen: Nasal Mucosa; Nasal Swab  Result Value Ref Range Status   MRSA by PCR Next Gen NOT DETECTED NOT DETECTED Final    Comment: (NOTE) The GeneXpert MRSA Assay (FDA approved for NASAL specimens only), is one component of a comprehensive MRSA colonization surveillance program. It is not intended to diagnose MRSA infection nor to guide or monitor treatment for MRSA infections. Test performance is not FDA approved in patients less than 30 years old. Performed at [facility], [address] [place], [place] [address]      Labs: Basic Metabolic Panel: Recent Labs  Lab 06/08/24 0421 06/09/24 0350 06/10/24 9661 06/11/24 0347 06/12/24 0312 06/13/24 0331 06/14/24 0318  NA 140 143 142 142 141 140 140  K 3.4* 3.8 3.4* 3.6 4.0 3.5 3.5  CL 107 110 111 109 108 107 108  CO2 23 23 22 24 24 23 24   GLUCOSE 205* 202* 230* 228* 326* 306* 333*  BUN 42* 44* 40* 37* 40* 41* 39*  CREATININE 2.65* 2.70* 2.38* 2.36* 2.34* 2.75* 2.44*  CALCIUM  8.7* 8.7* 8.8* 8.9 8.7* 8.4* 8.7*  MG 1.8 1.9  --   --   --   --   --   PHOS 2.5  --   --   --   --   --   --    Liver Function Tests: Recent Labs  Lab 06/08/24 0421  ALBUMIN 2.8*   No results for input(s): LIPASE, AMYLASE in the last 168 hours. No results for input(s): AMMONIA in the last 168 hours.  CBC: Recent Labs  Lab 06/09/24 0350 06/10/24 0338  WBC 5.1 5.9  NEUTROABS  --  3.9  HGB 12.7* 13.0  HCT 37.7* 38.6*  MCV 88.7 88.1  PLT 63* 80*   Cardiac Enzymes: No results for input(s): CKTOTAL, CKMB, CKMBINDEX, TROPONINI in the last 168 hours. BNP: BNP (last 3 results) No results for input(s): BNP in the last 8760 hours.  ProBNP (last 3 results) No results for input(s): PROBNP in the last 8760 hours.  CBG: Recent Labs  Lab 06/13/24 0741 06/13/24 1142 06/13/24 1615 06/13/24 2120 06/14/24 0718  GLUCAP 207* 218* 250* 330* 269*     Signed:  Colen Grimes MD   [facility] 06/14/2024, 9:22 AM       [1]  Allergies Allergen Reactions   Enalapril Maleate Cough

## 2024-06-16 ENCOUNTER — Other Ambulatory Visit: Payer: Self-pay | Admitting: Family Medicine

## 2024-06-29 ENCOUNTER — Telehealth: Payer: Self-pay

## 2024-06-29 MED ORDER — LIDOCAINE 5 % EX PTCH: 1.0000 | MEDICATED_PATCH | CUTANEOUS | 1 refills | Status: AC

## 2024-06-29 NOTE — Telephone Encounter (Signed)
 Lidocaine  refill signed. Ok to give verbal orders for SLP eval.

## 2024-07-01 ENCOUNTER — Ambulatory Visit: Payer: Self-pay | Admitting: Family Medicine

## 2024-07-01 ENCOUNTER — Encounter: Admitting: Dietician

## 2024-07-01 ENCOUNTER — Encounter: Payer: Self-pay | Admitting: Family Medicine

## 2024-07-01 VITALS — BP 140/75 | HR 70 | Ht 73.0 in | Wt 220.0 lb

## 2024-07-01 DIAGNOSIS — N184 Chronic kidney disease, stage 4 (severe): Secondary | ICD-10-CM

## 2024-07-01 DIAGNOSIS — E119 Type 2 diabetes mellitus without complications: Secondary | ICD-10-CM

## 2024-07-01 DIAGNOSIS — I5022 Chronic systolic (congestive) heart failure: Secondary | ICD-10-CM

## 2024-07-01 DIAGNOSIS — G8929 Other chronic pain: Secondary | ICD-10-CM

## 2024-07-01 MED ORDER — FUROSEMIDE 40 MG PO TABS: 40.0000 mg | ORAL_TABLET | Freq: Every day | ORAL | 0 refills | Status: AC

## 2024-07-01 MED ORDER — INSULIN ASPART 100 UNIT/ML FLEXPEN: 10.0000 [IU] | PEN_INJECTOR | Freq: Two times a day (BID) | SUBCUTANEOUS | 3 refills | Status: AC

## 2024-07-01 NOTE — Assessment & Plan Note (Signed)
 CGM reviewed, significant improvement in hypoglycemic events with reduction in LAI but continues to have elevated CBGs worse in the p.m.  Will adjust SAI dosing further optimize. - Increase to NovoLog  10 units twice daily - Continue Tresiba  12 units daily

## 2024-07-01 NOTE — Assessment & Plan Note (Signed)
 Fluid overload with bilateral leg swelling and weight gain (~10 pounds), likely due to Lasix  was held due to kidney injury during hospital admission.  No shortness of breath otherwise well-appearing with stable vitals, will diurese and monitor closely outpatient. - Restart Lasix  40 mg daily - Scheduled follow-up appointment for 2/10 - ED return precautions discussed

## 2024-07-01 NOTE — Assessment & Plan Note (Signed)
 Noted to have urinary retention with significant AKI during hospitalization.  Denies signs of retention today, will monitor BMP and recommend follow-up with nephrologist Dr. Tobie. - BMP

## 2024-07-01 NOTE — Assessment & Plan Note (Signed)
 Managed with gabapentin . Concerns about memory loss associated with long-term gabapentin  use - Reduce gabapentin  to twice daily. - Monitor for return of neuropathy symptoms and adjust dosage as needed.

## 2024-07-01 NOTE — Patient Instructions (Addendum)
 It was wonderful to see you today! Thank you for choosing [facility].   Please bring ALL of your medications with you to every visit.   Today we talked about:  I would like you to restart the Lasix  40 mg daily to help get the fluid off your legs.  Please take it every day and I will see you on Tuesday next week to see how you are doing.  If you develop shortness of breath, chest pain or you are unable to urinate on the medication I would like you to go to the hospital for evaluation.   We are going to check your kidney function today to ensure it is consistent with where it has been.  If your kidney function is significantly worse then I may need to contact you to hold the medication and we may need to send you to the hospital for evaluation. For your diabetes I would like you to increase your mealtime NovoLog  to 10 units twice per day and keep your Tresiba  the same at 12 units.  Please continue to wear your glucose monitor and monitor for any low blood sugar events. Your blood pressure looks good today, please try to monitor at home with a goal of the top number being in the 140s.  If it is persistently above that we can discuss if your medications need to be adjusted.  Please follow up next week  If you haven't already, sign up for My Chart to have easy access to your labs results, and communication with your primary care physician.   We are checking some labs today. If they are abnormal, I will call you. If they are normal, I will send you a MyChart message (if it is active) or a letter in the mail. If you do not hear about your labs in the next 2 weeks, please call the office.  Call the clinic at [phone] if your symptoms worsen or you have any concerns.  Please be sure to schedule follow up at the front desk before you leave today.   Izetta Nap, DO Family Medicine

## 2024-07-02 ENCOUNTER — Ambulatory Visit: Payer: Self-pay | Admitting: Family Medicine

## 2024-07-02 LAB — BASIC METABOLIC PANEL WITH GFR
BUN/Creatinine Ratio: 14 (ref 10–24)
BUN: 33 mg/dL — ABNORMAL HIGH (ref 8–27)
CO2: 24 mmol/L (ref 20–29)
Calcium: 8.8 mg/dL (ref 8.6–10.2)
Chloride: 99 mmol/L (ref 96–106)
Creatinine, Ser: 2.31 mg/dL — ABNORMAL HIGH (ref 0.76–1.27)
Glucose: 259 mg/dL — ABNORMAL HIGH (ref 70–99)
Potassium: 3.7 mmol/L (ref 3.5–5.2)
Sodium: 139 mmol/L (ref 134–144)
eGFR: 29 mL/min/{1.73_m2} — ABNORMAL LOW

## 2024-07-06 ENCOUNTER — Ambulatory Visit: Payer: Self-pay | Admitting: Family Medicine

## 2024-07-09 ENCOUNTER — Ambulatory Visit: Admitting: Podiatry

## 2024-08-23 ENCOUNTER — Encounter

## 2024-08-30 ENCOUNTER — Encounter

## 2024-11-29 ENCOUNTER — Encounter

## 2025-02-28 ENCOUNTER — Encounter

## 2025-05-30 ENCOUNTER — Encounter

## 2025-08-29 ENCOUNTER — Encounter
